# Patient Record
Sex: Female | Born: 1945 | Race: White | Hispanic: No | Marital: Married | Smoking: Never smoker
Health system: Southern US, Community
[De-identification: ages and names within clinical notes are randomized; demographics above are authoritative.]

## PROBLEM LIST (undated history)

## (undated) ENCOUNTER — Emergency Department (HOSPITAL_COMMUNITY): Admission: EM | Payer: Medicare Other | Source: Home / Self Care

## (undated) DIAGNOSIS — Z803 Family history of malignant neoplasm of breast: Secondary | ICD-10-CM

## (undated) DIAGNOSIS — K222 Esophageal obstruction: Secondary | ICD-10-CM

## (undated) DIAGNOSIS — Z86010 Personal history of colonic polyps: Secondary | ICD-10-CM

## (undated) DIAGNOSIS — D219 Benign neoplasm of connective and other soft tissue, unspecified: Secondary | ICD-10-CM

## (undated) DIAGNOSIS — Z8719 Personal history of other diseases of the digestive system: Secondary | ICD-10-CM

## (undated) DIAGNOSIS — Z8742 Personal history of other diseases of the female genital tract: Secondary | ICD-10-CM

## (undated) DIAGNOSIS — C786 Secondary malignant neoplasm of retroperitoneum and peritoneum: Secondary | ICD-10-CM

## (undated) DIAGNOSIS — C569 Malignant neoplasm of unspecified ovary: Secondary | ICD-10-CM

## (undated) DIAGNOSIS — E669 Obesity, unspecified: Secondary | ICD-10-CM

## (undated) DIAGNOSIS — I82409 Acute embolism and thrombosis of unspecified deep veins of unspecified lower extremity: Secondary | ICD-10-CM

## (undated) DIAGNOSIS — K5130 Ulcerative (chronic) rectosigmoiditis without complications: Secondary | ICD-10-CM

## (undated) DIAGNOSIS — H9201 Otalgia, right ear: Secondary | ICD-10-CM

## (undated) DIAGNOSIS — K5790 Diverticulosis of intestine, part unspecified, without perforation or abscess without bleeding: Secondary | ICD-10-CM

## (undated) DIAGNOSIS — I493 Ventricular premature depolarization: Secondary | ICD-10-CM

## (undated) DIAGNOSIS — Z800 Family history of malignant neoplasm of digestive organs: Secondary | ICD-10-CM

## (undated) DIAGNOSIS — Z8042 Family history of malignant neoplasm of prostate: Secondary | ICD-10-CM

## (undated) DIAGNOSIS — G709 Myoneural disorder, unspecified: Secondary | ICD-10-CM

## (undated) DIAGNOSIS — Z808 Family history of malignant neoplasm of other organs or systems: Secondary | ICD-10-CM

## (undated) DIAGNOSIS — I10 Essential (primary) hypertension: Secondary | ICD-10-CM

## (undated) DIAGNOSIS — C44621 Squamous cell carcinoma of skin of unspecified upper limb, including shoulder: Secondary | ICD-10-CM

## (undated) DIAGNOSIS — Z5189 Encounter for other specified aftercare: Secondary | ICD-10-CM

## (undated) DIAGNOSIS — E785 Hyperlipidemia, unspecified: Secondary | ICD-10-CM

## (undated) DIAGNOSIS — K219 Gastro-esophageal reflux disease without esophagitis: Secondary | ICD-10-CM

## (undated) DIAGNOSIS — G8929 Other chronic pain: Secondary | ICD-10-CM

## (undated) DIAGNOSIS — N3941 Urge incontinence: Secondary | ICD-10-CM

## (undated) HISTORY — DX: Secondary malignant neoplasm of retroperitoneum and peritoneum: C78.6

## (undated) HISTORY — DX: Acute embolism and thrombosis of unspecified deep veins of unspecified lower extremity: I82.409

## (undated) HISTORY — DX: Family history of malignant neoplasm of other organs or systems: Z80.8

## (undated) HISTORY — PX: COLONOSCOPY: SHX174

## (undated) HISTORY — PX: SKIN CANCER EXCISION: SHX779

## (undated) HISTORY — PX: BREAST LUMPECTOMY: SHX2

## (undated) HISTORY — PX: BREAST BIOPSY: SHX20

## (undated) HISTORY — DX: Hyperlipidemia, unspecified: E78.5

## (undated) HISTORY — DX: Encounter for other specified aftercare: Z51.89

## (undated) HISTORY — DX: Diverticulosis of intestine, part unspecified, without perforation or abscess without bleeding: K57.90

## (undated) HISTORY — DX: Esophageal obstruction: K22.2

## (undated) HISTORY — DX: Personal history of other diseases of the female genital tract: Z87.42

## (undated) HISTORY — DX: Obesity, unspecified: E66.9

## (undated) HISTORY — DX: Family history of malignant neoplasm of digestive organs: Z80.0

## (undated) HISTORY — DX: Other chronic pain: G89.29

## (undated) HISTORY — DX: Malignant neoplasm of unspecified ovary: C56.9

## (undated) HISTORY — DX: Ulcerative (chronic) rectosigmoiditis without complications: K51.30

## (undated) HISTORY — DX: Family history of malignant neoplasm of breast: Z80.3

## (undated) HISTORY — DX: Personal history of colonic polyps: Z86.010

## (undated) HISTORY — DX: Ventricular premature depolarization: I49.3

## (undated) HISTORY — PX: UPPER GASTROINTESTINAL ENDOSCOPY: SHX188

## (undated) HISTORY — DX: Otalgia, right ear: H92.01

## (undated) HISTORY — PX: PARTIAL HYSTERECTOMY: SHX80

## (undated) HISTORY — DX: Urge incontinence: N39.41

## (undated) HISTORY — DX: Family history of malignant neoplasm of prostate: Z80.42

## (undated) HISTORY — DX: Gastro-esophageal reflux disease without esophagitis: K21.9

## (undated) HISTORY — DX: Benign neoplasm of connective and other soft tissue, unspecified: D21.9

## (undated) HISTORY — DX: Squamous cell carcinoma of skin of unspecified upper limb, including shoulder: C44.621

---

## 1978-01-29 HISTORY — PX: ECTOPIC PREGNANCY SURGERY: SHX613

## 1996-01-30 DIAGNOSIS — D219 Benign neoplasm of connective and other soft tissue, unspecified: Secondary | ICD-10-CM

## 1996-01-30 HISTORY — PX: BLADDER NECK SUSPENSION: SHX1240

## 1996-01-30 HISTORY — DX: Benign neoplasm of connective and other soft tissue, unspecified: D21.9

## 2006-01-15 ENCOUNTER — Ambulatory Visit: Payer: Self-pay | Admitting: Family Medicine

## 2006-02-06 ENCOUNTER — Other Ambulatory Visit: Admission: RE | Admit: 2006-02-06 | Discharge: 2006-02-06 | Payer: Self-pay | Admitting: Family Medicine

## 2006-02-06 ENCOUNTER — Ambulatory Visit: Payer: Self-pay | Admitting: Family Medicine

## 2006-02-06 ENCOUNTER — Encounter: Admission: RE | Admit: 2006-02-06 | Discharge: 2006-02-06 | Payer: Self-pay | Admitting: Family Medicine

## 2006-02-27 ENCOUNTER — Ambulatory Visit: Payer: Self-pay | Admitting: Internal Medicine

## 2006-04-12 ENCOUNTER — Ambulatory Visit: Payer: Self-pay | Admitting: Internal Medicine

## 2006-11-13 ENCOUNTER — Telehealth: Payer: Self-pay | Admitting: Family Medicine

## 2006-11-13 DIAGNOSIS — R002 Palpitations: Secondary | ICD-10-CM | POA: Insufficient documentation

## 2007-06-03 ENCOUNTER — Encounter: Payer: Self-pay | Admitting: Family Medicine

## 2007-06-03 DIAGNOSIS — Z87898 Personal history of other specified conditions: Secondary | ICD-10-CM | POA: Insufficient documentation

## 2007-06-03 DIAGNOSIS — N3941 Urge incontinence: Secondary | ICD-10-CM

## 2007-06-03 DIAGNOSIS — E785 Hyperlipidemia, unspecified: Secondary | ICD-10-CM | POA: Insufficient documentation

## 2007-06-03 HISTORY — DX: Urge incontinence: N39.41

## 2007-06-04 ENCOUNTER — Ambulatory Visit: Payer: Self-pay | Admitting: Family Medicine

## 2007-06-04 LAB — CONVERTED CEMR LAB
Blood in Urine, dipstick: NEGATIVE
Mucus, UA: 0
Nitrite: NEGATIVE
Specific Gravity, Urine: 1.01
Urine crystals, microscopic: 0 /hpf
Yeast, UA: 0
pH: 5

## 2007-06-10 LAB — CONVERTED CEMR LAB
ALT: 17 units/L (ref 0–35)
AST: 17 units/L (ref 0–37)
Alkaline Phosphatase: 64 units/L (ref 39–117)
BUN: 12 mg/dL (ref 6–23)
Basophils Absolute: 0.1 10*3/uL (ref 0.0–0.1)
Basophils Relative: 0.9 % (ref 0.0–1.0)
Chloride: 109 meq/L (ref 96–112)
Cholesterol: 277 mg/dL (ref 0–200)
Direct LDL: 203.9 mg/dL
Eosinophils Absolute: 0.1 10*3/uL (ref 0.0–0.7)
Eosinophils Relative: 1.9 % (ref 0.0–5.0)
GFR calc Af Amer: 94 mL/min
HCT: 41.2 % (ref 36.0–46.0)
HDL: 53 mg/dL (ref >39.0)
Lymphocytes Relative: 28 % (ref 12.0–46.0)
Monocytes Relative: 8.2 % (ref 3.0–12.0)
Neutro Abs: 3.8 10*3/uL (ref 1.4–7.7)
Neutrophils Relative %: 61 % (ref 43.0–77.0)
Platelets: 235 10*3/uL (ref 150–400)
RBC: 4.56 M/uL (ref 3.87–5.11)
Sodium: 140 meq/L (ref 135–145)
TSH: 1.82 microintl units/mL (ref 0.35–5.50)
Total Bilirubin: 1.1 mg/dL (ref 0.3–1.2)
Total CHOL/HDL Ratio: 5.2
WBC: 6.2 10*3/uL (ref 4.5–10.5)

## 2008-04-16 ENCOUNTER — Encounter: Admission: RE | Admit: 2008-04-16 | Discharge: 2008-04-16 | Payer: Self-pay | Admitting: Family Medicine

## 2008-04-20 ENCOUNTER — Encounter (INDEPENDENT_AMBULATORY_CARE_PROVIDER_SITE_OTHER): Payer: Self-pay | Admitting: *Deleted

## 2008-06-04 ENCOUNTER — Ambulatory Visit: Payer: Self-pay | Admitting: Family Medicine

## 2008-06-07 ENCOUNTER — Encounter (INDEPENDENT_AMBULATORY_CARE_PROVIDER_SITE_OTHER): Payer: Self-pay | Admitting: *Deleted

## 2008-06-07 LAB — CONVERTED CEMR LAB
AST: 22 units/L (ref 0–37)
BUN: 10 mg/dL (ref 6–23)
Basophils Absolute: 0 10*3/uL (ref 0.0–0.1)
CO2: 29 meq/L (ref 19–32)
Eosinophils Absolute: 0.2 10*3/uL (ref 0.0–0.7)
Eosinophils Relative: 2.7 % (ref 0.0–5.0)
Hemoglobin: 14.5 g/dL (ref 12.0–15.0)
Lymphocytes Relative: 39.8 % (ref 12.0–46.0)
MCHC: 34.4 g/dL (ref 30.0–36.0)
MCV: 90.6 fL (ref 78.0–100.0)
Neutro Abs: 3 10*3/uL (ref 1.4–7.7)
Potassium: 3.9 meq/L (ref 3.5–5.1)
RBC: 4.66 M/uL (ref 3.87–5.11)
Sodium: 140 meq/L (ref 135–145)
TSH: 4.15 microintl units/mL (ref 0.35–5.50)

## 2008-06-08 LAB — CONVERTED CEMR LAB: Vit D, 25-Hydroxy: 33 ng/mL (ref 30–89)

## 2008-07-06 ENCOUNTER — Ambulatory Visit: Payer: Self-pay | Admitting: Cardiology

## 2008-07-16 ENCOUNTER — Encounter: Payer: Self-pay | Admitting: Cardiology

## 2008-07-16 ENCOUNTER — Ambulatory Visit: Payer: Self-pay

## 2008-09-30 ENCOUNTER — Ambulatory Visit: Payer: Self-pay | Admitting: Cardiology

## 2008-10-05 ENCOUNTER — Ambulatory Visit: Payer: Self-pay | Admitting: Cardiology

## 2008-10-05 LAB — CONVERTED CEMR LAB
AST: 27 units/L (ref 0–37)
Alkaline Phosphatase: 60 units/L (ref 39–117)
Bilirubin, Direct: 0.1 mg/dL (ref 0.0–0.3)
Cholesterol: 136 mg/dL (ref 0–200)
HDL: 57 mg/dL (ref >39.00)
LDL Cholesterol: 58 mg/dL (ref 0–99)
Total Bilirubin: 1 mg/dL (ref 0.3–1.2)
Total CHOL/HDL Ratio: 2
Total Protein: 6.9 g/dL (ref 6.0–8.3)
VLDL: 21 mg/dL (ref 0.0–40.0)

## 2008-11-29 ENCOUNTER — Telehealth (INDEPENDENT_AMBULATORY_CARE_PROVIDER_SITE_OTHER): Payer: Self-pay | Admitting: *Deleted

## 2008-12-29 ENCOUNTER — Ambulatory Visit: Payer: Self-pay | Admitting: Family Medicine

## 2009-05-11 ENCOUNTER — Encounter: Admission: RE | Admit: 2009-05-11 | Discharge: 2009-05-11 | Payer: Self-pay | Admitting: Family Medicine

## 2009-05-12 ENCOUNTER — Encounter: Payer: Self-pay | Admitting: Family Medicine

## 2009-06-06 ENCOUNTER — Ambulatory Visit: Payer: Self-pay | Admitting: Family Medicine

## 2009-06-06 DIAGNOSIS — M79609 Pain in unspecified limb: Secondary | ICD-10-CM | POA: Insufficient documentation

## 2009-06-06 DIAGNOSIS — M722 Plantar fascial fibromatosis: Secondary | ICD-10-CM

## 2009-06-07 LAB — CONVERTED CEMR LAB
Albumin: 4.4 g/dL (ref 3.5–5.2)
Alkaline Phosphatase: 61 units/L (ref 39–117)
Basophils Relative: 0.4 % (ref 0.0–3.0)
CO2: 26 meq/L (ref 19–32)
Eosinophils Relative: 2.4 % (ref 0.0–5.0)
GFR calc non Af Amer: 94.96 mL/min (ref >60)
HDL: 66.6 mg/dL (ref >39.00)
Hemoglobin: 14 g/dL (ref 12.0–15.0)
Lymphocytes Relative: 32.3 % (ref 12.0–46.0)
Lymphs Abs: 2.2 10*3/uL (ref 0.7–4.0)
MCHC: 34.7 g/dL (ref 30.0–36.0)
MCV: 89.4 fL (ref 78.0–100.0)
Neutro Abs: 3.9 10*3/uL (ref 1.4–7.7)
Platelets: 248 10*3/uL (ref 150.0–400.0)
Potassium: 4.6 meq/L (ref 3.5–5.1)
RBC: 4.5 M/uL (ref 3.87–5.11)
TSH: 2.49 microintl units/mL (ref 0.35–5.50)
Total Bilirubin: 0.6 mg/dL (ref 0.3–1.2)
Triglycerides: 111 mg/dL (ref 0.0–149.0)
VLDL: 22.2 mg/dL (ref 0.0–40.0)
WBC: 6.9 10*3/uL (ref 4.5–10.5)

## 2009-06-10 ENCOUNTER — Ambulatory Visit: Payer: Self-pay | Admitting: Family Medicine

## 2009-08-19 ENCOUNTER — Telehealth: Payer: Self-pay | Admitting: Family Medicine

## 2009-10-04 ENCOUNTER — Ambulatory Visit: Payer: Self-pay | Admitting: Cardiology

## 2009-10-07 ENCOUNTER — Ambulatory Visit: Payer: Self-pay | Admitting: Cardiology

## 2009-10-10 LAB — CONVERTED CEMR LAB
ALT: 26 units/L (ref 0–35)
AST: 23 units/L (ref 0–37)
Albumin: 4 g/dL (ref 3.5–5.2)
Alkaline Phosphatase: 46 units/L (ref 39–117)
LDL Cholesterol: 71 mg/dL (ref 0–99)
Total Bilirubin: 0.6 mg/dL (ref 0.3–1.2)
Total Protein: 6.3 g/dL (ref 6.0–8.3)

## 2009-11-22 ENCOUNTER — Ambulatory Visit: Payer: Self-pay | Admitting: Family Medicine

## 2010-02-28 NOTE — Letter (Signed)
Summary: Results Follow up Letter  [place] [facility]  [address] [place], [place] [address]   Phone: [phone]  Fax: [phone]    05/12/2009 MRN: [id number]    [facility] [place], [place]  [address]    Dear Ms. Viner,  The following are the results of your recent test(s):  Test         Result    Pap Smear:        Normal _____  Not Normal _____ Comments: ______________________________________________________ Cholesterol: LDL(Bad cholesterol):         Your goal is less than:         HDL (Good cholesterol):       Your goal is more than: Comments:  ______________________________________________________ Mammogram:        Normal __X___  Not Normal _____ Comments:Please repeat Mammogram in one year.  ___________________________________________________________________ Hemoccult:        Normal _____  Not normal _______ Comments:    _____________________________________________________________________ Other Tests:    We routinely do not discuss normal results over the telephone.  If you desire a copy of the results, or you have any questions about this information we can discuss them at your next office visit.   Sincerely,    Roque Lias Tower,MD  MT/ri

## 2010-02-28 NOTE — Consult Note (Signed)
Summary: [facility] By: Edmonia James 06/21/2009 10:49:08  _____________________________________________________________________  External Attachment:    Type:   Image     Comment:   External Document

## 2010-02-28 NOTE — Assessment & Plan Note (Signed)
Summary: [place]  Nurse Visit   Allergies: No Known Drug Allergies  Immunizations Administered:  Zostavax # 1:    Vaccine Type: Zostavax    Site: left deltoid    Mfr: Merck    Dose: 0.5 ml    Route: [place]    Given by: Ozzie Hoyle LPN    Exp. Date: 07/08/2010    Lot #: [id number]    VIS given: 11/10/04 given Jun 10, 2009.  Waiver of liability form signed and to be scanned into system. Pt had contacted her insurance co and pt is not around any person on chemotherapy or pregnant.Ozzie Hoyle LPN  Jun 11, 4095 35:32 PM  Orders Added: 1)  Zoster (Shingles) Vaccine Live [99242] 2)  Admin 1st Vaccine [id number]

## 2010-02-28 NOTE — Assessment & Plan Note (Signed)
Summary: [facility]   Primary Provider:  Allena Earing MD  CC:  follow up 1 year.  Pt notes no new cardiac symptoms.  She reports she still has the occasional irregular heartbeat but nothing out of the ordinary.  Pt would like copy of recent labs and echo from last year.  History of Present Illness: 65 yo with hyperlipidemia and family history of sudden cardiac death and CAD returns for followup.  Since last appointment, she has been doing well.  She continues to look after her husband who has PLS. No chest pain or exertional dyspnea.  She does have occasional palpitations.  Main complaint is a dry cough.  This often will occur while she is eating.  She says she will cough as soon as she swallows food (never with drinking). She coughs at night when lying in bed.   Labs (9/10):  LDL 58, HDL 57, AST 27, ALT 36 Labs (9/11): LDL 71, HDL 54  ECG: NSR, normal  Current Medications (verified): 1)  Adult Aspirin Low Strength 81 Mg  Tbdp (Aspirin) .... Take One By Mouth Daily 2)  Cranberry 300 Mg Tabs (Cranberry) .... Take 1 Tablet By Mouth Once A Day 3)  Co Q-10 150 Mg Caps (Coenzyme Q10) .... Take 1 Tablet By Mouth Once A Day 4)  Crestor 20 Mg Tabs (Rosuvastatin Calcium) .Marland Kitchen.. 1 By Mouth Once Daily 5)  Vitamin D3 5,000 Iu .... Take One By Mouth Every Other Day 6)  Fish Oil 1200 Mg Caps (Omega-3 Fatty Acids) .... Take 1 Capsule By Mouth Twice A Day 7)  Grape Seed Extract 100 Mg Caps (Grape Seed) .... Two Times A Day Cap 8)  Calcium Citrate-Vitamin D 315-200 Mg-Unit  Tabs (Calcium Citrate-Vitamin D) .... Take One By Mouth Twice A Day 9)  B Complex  Tabs (B Complex Vitamins) .... Take One Tablet Once Daily  Allergies (verified): No Known Drug Allergies  Past History:  Past Medical History: 1. Hyperlipidemia 2. 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  4. Echo (6/10): EF 15-40%, normal diastolic function, normal RV size and function, mild MR, PASP 33 mmHg.  5. GERD  Family  History: Reviewed history from 06/06/2009 and no changes required. Father: sudden death age 51- "coronary arteriosclerosis" on death certificate as cause of death.  Mother: CAD, MI in 56's, TIA's before that, sudden death- ? cardiac , DM 2  Siblings: Brother with arrythmia, colon cancer.  Sister has breast cancer.  Son pituitary tumor 2/10   Social History: Reviewed history from 06/04/2008 and no changes required. 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   Vital Signs:  Patient profile:   65 year old female Height:      62.5 inches Weight:      192 pounds BMI:     34.68 Pulse rate:   66 / minute Pulse rhythm:   regular BP sitting:   116 / 74  (left arm) Cuff size:   regular  Vitals Entered By: Doug Sou CMA (October 07, 2009 8:57 AM)  Physical Exam  General:  overweight but generally well appearing  Neck:  Neck supple, no JVD. No masses, thyromegaly or abnormal cervical nodes. Lungs:  Clear bilaterally to auscultation and percussion. Heart:  Non-displaced PMI, chest non-tender; regular rate and  rhythm, S1, S2 without murmurs, rubs or gallops. Carotid upstroke normal, no bruit.  Pedals normal pulses. No edema, no varicosities. Abdomen:  Bowel sounds positive; abdomen soft and non-tender without masses, organomegaly, or hernias noted. No hepatosplenomegaly. Extremities:  No clubbing or cyanosis. Neurologic:  Alert and oriented x 3. Psych:  Normal affect.   Impression & Recommendations:  Problem # 1:  CORONARY ARTERY DISEASE, FAMILY HX (ICD-V17.3) No chest pain or exertional dyspnea.  Lipids are excellent on Crestor.  Would continue Crestor and low-dose ASA.   Problem # 2:  COUGH Patient reports a cough when she swallows and also at night when lying down.  This may be due to GERD, so  will start her on omeprazole  40 mg daily.  If this does not help, she may need a barium swallow to assess for aspiration (coughs right when she swallows some foods).    Patient Instructions: 1)  Your physician wants you to follow-up in:1 year with Dr Aundra Dubin.  You will receive a reminder letter in the mail two months in advance. If you don't receive a letter, please call our office to schedule the follow-up appointment. Prescriptions: OMEPRAZOLE 40 MG CPDR (OMEPRAZOLE) one daily  #30 x 11   Entered by:   Desiree Lucy, RN, BSN   Authorized by:   [name], MD   Signed by:   Desiree Lucy, RN, BSN on 10/07/2009   Method used:   Print then Give to Patient   RxID:   [id number] CRESTOR 20 MG TABS (ROSUVASTATIN CALCIUM) 1 by mouth once daily  #30 x 12   Entered by:   Desiree Lucy, RN, BSN   Authorized by:   [name], MD   Signed by:   Desiree Lucy, RN, BSN on 10/07/2009   Method used:   Print then Give to Patient   RxID:   [id number]

## 2010-02-28 NOTE — Miscellaneous (Signed)
Summary: mammogram screening  Clinical Lists Changes  Observations: Added new observation of MAMMO DUE: 04/2010 (05/12/2009 11:56) Added new observation of MAMMOGRAM: normal (05/11/2009 11:57)      Preventive Care Screening  Mammogram:    Date:  05/11/2009    Next Due:  04/2010    Results:  normal

## 2010-02-28 NOTE — Assessment & Plan Note (Signed)
Summary: [facility]  Nurse Visit   Allergies: No Known Drug Allergies  Orders Added: 1)  Admin 1st Vaccine [90471] 2)  Flu Vaccine 4yr + [[79980]    Flu Vaccine Consent Questions     Do you have a history of severe allergic reactions to this vaccine? no    Any prior history of allergic reactions to egg and/or gelatin? no    Do you have a sensitivity to the preservative Thimersol? no    Do you have a past history of Guillan-Barre Syndrome? no    Do you currently have an acute febrile illness? no    Have you ever had a severe reaction to latex? no    Vaccine information given and explained to patient? yes    Are you currently pregnant? no    Lot Number:[id number]   Exp Date:07/29/2010   Site Given  Left Deltoid IM

## 2010-02-28 NOTE — Assessment & Plan Note (Signed)
Summary: CPX / LFW   Vital Signs:  Patient profile:   65 year old female Height:      62.5 inches Weight:      198.75 pounds BMI:     35.90 Temp:     98.1 degrees F oral Pulse rate:   64 / minute Pulse rhythm:   regular BP sitting:   122 / 76  (left arm) Cuff size:   large  Vitals Entered By: Ozzie Hoyle LPN (Jun 07, 3662 4:03 AM) CC: CPX LMP part Hyst 1998   History of Present Illness: here for health mt and to rev chronic med problems  feeling ok overall   not much new   her feet kill her -- mainly heels -- worse when she takes shoes off and when she first gets up  her sister had plantar fasciitis  helps to stretch feet too   husband retired - Production assistant, radio  is not working- looking for a job in medical  husband is has PLS -- is getting weaker and falls a lot   wt is up 7 lb - with bmi 35  lipids due to check on statin diet- has not been the best / she likes better food , but has to cook for her husband -- so eating worse food and more than she should  will check labs today  has exercised- walks 3/4 mi per day   bp good 122/76  colonosc 08 ok with tics-- re check 5y planned  hyst in past for fibroids/partial nl pap 08  mam 4/11 normal  self exam -- no lumps or changes   ca and D-- is taking that -- dropped to every other day on vit d -- because her ca has D in it    TD 07 wantszostavax had pneumonia vaccine in fall  Allergies (verified): No Known Drug Allergies  Past History:  Past Medical History: Last updated: 10/05/2008 1. Hyperlipidemia 2. 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  4. Echo (6/10): EF 47-42%, normal diastolic function, normal RV size and function, mild MR, PASP 33 mmHg.   Past Surgical History: Last updated: 06/04/2007 Tubal pregnancy (1980) Blood transfusion Hysterectomy- partial, fibroids and prolapse (1998)- bladder tack Work up for chronic right ear pain- normal MRI Colonoscopy- tics  (03/2006)  Family History: Last updated: 2009-06-08 Father: sudden death age 70- "coronary arteriosclerosis" on death certificate as cause of death.  Mother: CAD, MI in 79's, TIA's before that, sudden death- ? cardiac , DM 2  Siblings: Brother with arrythmia, colon cancer.  Sister has breast cancer.  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  Mother: CAD, MI in 61's, TIA's before that, sudden death- ? cardiac , DM 2  Siblings: Brother with arrythmia, colon cancer.  Sister has breast cancer.  Son pituitary tumor 2/10   Review of Systems General:  Denies fatigue, loss of appetite, and malaise. Eyes:  Denies blurring and eye irritation. CV:  Denies chest pain or discomfort, lightheadness, and palpitations. Resp:  Denies cough, shortness of breath, sputum productive, and wheezing. GI:  Denies abdominal pain, bloody stools, change in bowel habits, and indigestion. GU:  Denies abnormal vaginal bleeding, discharge,  dysuria, nocturia, and urinary frequency. MS:  Denies joint redness, joint swelling, muscle aches, and cramps. Derm:  Denies itching, lesion(s), poor wound healing, and rash. Neuro:  Denies numbness and tingling. Psych:  mood is fair . Endo:  Denies cold intolerance, excessive thirst, excessive urination, and heat intolerance. Heme:  Denies abnormal bruising and bleeding.  Physical Exam  General:  overweight but generally well appearing  Head:  normocephalic, atraumatic, and no abnormalities observed.   Eyes:  vision grossly intact, pupils equal, pupils round, and pupils reactive to light.   Ears:  R ear normal and L ear normal.   Nose:  no  nasal discharge.   Mouth:  pharynx pink and moist.   Neck:  supple with full rom and no masses or thyromegally, no JVD or carotid bruit  Chest Wall:  No deformities, masses, or tenderness noted. Breasts:  No mass, nodules, thickening, tenderness, bulging, retraction, inflamation, nipple discharge or skin changes noted.   Lungs:  Normal respiratory effort, chest expands symmetrically. Lungs are clear to auscultation, no crackles or wheezes. (bs are harsh at bases with dry cough) Heart:  Normal rate and regular rhythm. S1 and S2 normal without gallop, murmur, click, rub or other extra sounds. Abdomen:  Bowel sounds positive,abdomen soft and non-tender without masses, organomegaly or hernias noted. no renal bruits  Msk:  R thumb- lump consistent with herberden's node on distal joint  nl rom hand   no heel or foot tenderness while sitting pt c/o heel pain to get up and walk Pulses:  R and L carotid,radial,femoral,dorsalis pedis and posterior tibial pulses are full and equal bilaterally Extremities:  No clubbing, cyanosis, edema, or deformity noted with normal full range of motion of all joints.   Neurologic:  sensation intact to light touch, gait normal, and DTRs symmetrical and normal.   Skin:  Intact without suspicious lesions or rashes lentigos diffusely  Cervical Nodes:  No lymphadenopathy noted Axillary Nodes:  No palpable lymphadenopathy Inguinal Nodes:  No significant adenopathy Psych:  nl affect    Impression & Recommendations:  Problem # 1:  HEALTH MAINTENANCE EXAM (ICD-V70.0) Assessment Comment Only  reviewed health habits including diet, exercise and skin cancer prevention reviewed health maintenance list and family history disc need for wt loss and better habits  lab today set up zostavax   Orders: Venipuncture (24462) TLB-Lipid Panel (80061-LIPID) TLB-BMP (Basic Metabolic Panel-BMET) (86381-RRNHAFB) TLB-CBC Platelet - w/Differential (85025-CBCD) TLB-Hepatic/Liver  Function Pnl (80076-HEPATIC) TLB-TSH (Thyroid Stimulating Hormone) (84443-TSH)  Problem # 2:  HYPERLIPIDEMIA (ICD-272.4) Assessment: Unchanged  check labs on crestor and diet  disc low sat fat diet  pt expects this to be up Her updated medication list for this problem includes:    Crestor 20 Mg Tabs (Rosuvastatin calcium) .Marland Kitchen... 1 by mouth once daily  Labs Reviewed: SGOT: 27 (09/30/2008)   SGPT: 36 (09/30/2008)   HDL:57.00 (09/30/2008), 55.10 (06/04/2008)  LDL:58 (09/30/2008), DEL (06/04/2007)  Chol:136 (09/30/2008), 339 (06/04/2008)  Trig:105.0 (09/30/2008), 207.0 (06/04/2008)  Orders: Venipuncture (90383) TLB-Lipid Panel (80061-LIPID) TLB-BMP (Basic Metabolic Panel-BMET) (33832-NVBTYOM) TLB-CBC Platelet - w/Differential (85025-CBCD) TLB-Hepatic/Liver Function Pnl (80076-HEPATIC) TLB-TSH (Thyroid Stimulating Hormone) (84443-TSH)  Problem # 3:  FASCIITIS, PLANTAR (ICD-728.71) Assessment: New with heel pain if barefoot handout given from [facility]- recommend always wearing shoes/ stretches and ice  if not imp will f/u DR Copland  Problem # 4:  THUMB PAIN (ICD-729.5) Assessment: 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 Take one by mouth daily 2)  Cranberry 300 Mg Tabs (Cranberry) .... Take 1 tablet by mouth once a day 3)  Co Q-10 150 Mg Caps (Coenzyme q10) .... Take 1 tablet by mouth once a day 4)  Crestor 20 Mg Tabs (Rosuvastatin calcium) .Marland Kitchen.. 1 by mouth once daily 5)  Vitamin D3 5,000 Iu  .... Take one by mouth every other day 6)  Fish Oil 1200 Mg Caps (Omega-3 fatty acids) .... Take 1 capsule by mouth twice a day 7)  Grape Seed Extract 100 Mg Caps (Grape seed) .... Two times a day cap 8)  Calcium Citrate-vitamin D 315-200 Mg-unit Tabs (Calcium  citrate-vitamin d) .... Take one by mouth twice a day  Patient Instructions: 1)  if plantar fasciitis worsens - call for appt with Dr Lorelei Pont- our sports med doctor 2)  I will ref you to a hand specialist at check out  3)  keep working on healthy diet and exercise 4)  labs today  5)  no change in medicine  Prescriptions: CRESTOR 20 MG TABS (ROSUVASTATIN CALCIUM) 1 by mouth once daily  #30 x 11   Entered and Authorized by:   Allena Earing MD   Signed by:   Allena Earing MD on 06/06/2009   Method used:   Print then Give to Patient   RxID:   [id number] CRESTOR 20 MG TABS (ROSUVASTATIN CALCIUM) 1 by mouth once daily  #90 x 3   Entered and Authorized by:   Allena Earing MD   Signed by:   Allena Earing MD on 06/06/2009   Method used:   Print then Give to Patient   RxID:   [id number]   Current Allergies (reviewed today): No known allergies

## 2010-02-28 NOTE — Progress Notes (Signed)
Summary: ? about blood work   Phone Note Call from Patient Call back at [phone]   Caller: Patient Call For: Rachel Earing MD Summary of Call: Pt went to see hand specialist your recommended. Pt is to have blood work to be done for autoimmune arthritis. Pt wants to know if taking ASA 67m daily will effect the blood test. Pt asked at the lab and they told her to ask hand specialist. Hand specialist did not know and pt was told to contact primary care. Please advise.  Initial call taken by: ROzzie HoyleLPN,  July 22, [id number]  Follow-up for Phone Call        I do not think it will affect the labs Follow-up by: MAllena EaringMD,  August 19, 2009 8:24 AM  Additional Follow-up for Phone Call Additional follow up Details #1::        Patient notified as instructed by telephone. ROzzie HoyleLPN  July 22, [id number]

## 2010-04-14 ENCOUNTER — Encounter: Payer: Self-pay | Admitting: Family Medicine

## 2010-04-28 ENCOUNTER — Telehealth: Payer: Self-pay | Admitting: *Deleted

## 2010-04-28 ENCOUNTER — Other Ambulatory Visit: Payer: Self-pay | Admitting: Family Medicine

## 2010-04-28 DIAGNOSIS — Z780 Asymptomatic menopausal state: Secondary | ICD-10-CM

## 2010-04-28 DIAGNOSIS — Z1231 Encounter for screening mammogram for malignant neoplasm of breast: Secondary | ICD-10-CM

## 2010-04-28 NOTE — Telephone Encounter (Signed)
Pt is asking if you think she should have a bone density, now that she is 64.

## 2010-04-30 NOTE — Telephone Encounter (Signed)
If she has family hx of OP or she has been on long term steroids or is on medicine to lower stomach acid or is hypothyroid/ hyperthyroid   or has broken bones in later life-- yes OR -- if she checks with her ins and will cover-- yes  Otherwise will wait until she is 22  Keep taking ca and vit D-- very important

## 2010-05-01 NOTE — Telephone Encounter (Signed)
Patient notified as instructed by telephone. Pt said she does not have any of problems listed and will wait until age 65 unless condition changes.

## 2010-05-01 NOTE — Telephone Encounter (Signed)
Left message for pt to call back  °

## 2010-05-15 ENCOUNTER — Other Ambulatory Visit: Payer: Self-pay

## 2010-05-15 ENCOUNTER — Ambulatory Visit
Admission: RE | Admit: 2010-05-15 | Discharge: 2010-05-15 | Disposition: A | Discharge status: Home / Self Care | Payer: BLUE CROSS/BLUE SHIELD | Source: Ambulatory Visit | Attending: Family Medicine | Admitting: Family Medicine

## 2010-05-15 DIAGNOSIS — Z1231 Encounter for screening mammogram for malignant neoplasm of breast: Secondary | ICD-10-CM

## 2010-05-15 LAB — HM MAMMOGRAPHY: HM Mammogram: NORMAL

## 2010-05-27 ENCOUNTER — Encounter: Payer: Self-pay | Admitting: Family Medicine

## 2010-06-01 ENCOUNTER — Telehealth: Payer: Self-pay | Admitting: Family Medicine

## 2010-06-01 DIAGNOSIS — E785 Hyperlipidemia, unspecified: Secondary | ICD-10-CM

## 2010-06-01 DIAGNOSIS — Z0000 Encounter for general adult medical examination without abnormal findings: Secondary | ICD-10-CM | POA: Insufficient documentation

## 2010-06-01 NOTE — Telephone Encounter (Signed)
Message copied by Loura Pardon on Thu Jun 01, 2010  8:21 AM ------      Message from: Daralene Milch      Created: Tue May 30, 2010  1:42 PM      Regarding: Cpx labs fri       Please order  future cpx labs for pt's upcomming lab appt.      Thanks      Aniceto Boss

## 2010-06-02 ENCOUNTER — Other Ambulatory Visit (INDEPENDENT_AMBULATORY_CARE_PROVIDER_SITE_OTHER): Payer: BC Managed Care – PPO | Admitting: Family Medicine

## 2010-06-02 DIAGNOSIS — Z0000 Encounter for general adult medical examination without abnormal findings: Secondary | ICD-10-CM

## 2010-06-02 DIAGNOSIS — E785 Hyperlipidemia, unspecified: Secondary | ICD-10-CM

## 2010-06-02 LAB — COMPREHENSIVE METABOLIC PANEL
AST: 23 U/L (ref 0–37)
Albumin: 4 g/dL (ref 3.5–5.2)
Alkaline Phosphatase: 47 U/L (ref 39–117)
Calcium: 9.4 mg/dL (ref 8.4–10.5)
Chloride: 106 mEq/L (ref 96–112)
GFR: 93.05 mL/min (ref >60.00)
Glucose, Bld: 104 mg/dL — ABNORMAL HIGH (ref 70–99)
Total Protein: 6.8 g/dL (ref 6.0–8.3)

## 2010-06-02 LAB — CBC WITH DIFFERENTIAL/PLATELET
Basophils Absolute: 0 10*3/uL (ref 0.0–0.1)
HCT: 39.3 % (ref 36.0–46.0)
MCHC: 34.7 g/dL (ref 30.0–36.0)
Neutro Abs: 3.5 10*3/uL (ref 1.4–7.7)
Neutrophils Relative %: 56.8 % (ref 43.0–77.0)
Platelets: 218 10*3/uL (ref 150.0–400.0)
RBC: 4.36 Mil/uL (ref 3.87–5.11)
RDW: 13.2 % (ref 11.5–14.6)
WBC: 6.2 10*3/uL (ref 4.5–10.5)

## 2010-06-02 LAB — LIPID PANEL: Cholesterol: 176 mg/dL (ref 0–200)

## 2010-06-08 ENCOUNTER — Encounter: Payer: Self-pay | Admitting: Family Medicine

## 2010-06-09 ENCOUNTER — Ambulatory Visit (INDEPENDENT_AMBULATORY_CARE_PROVIDER_SITE_OTHER): Payer: BC Managed Care – PPO | Admitting: Family Medicine

## 2010-06-09 ENCOUNTER — Encounter: Payer: Self-pay | Admitting: Family Medicine

## 2010-06-09 DIAGNOSIS — M722 Plantar fascial fibromatosis: Secondary | ICD-10-CM

## 2010-06-09 DIAGNOSIS — L989 Disorder of the skin and subcutaneous tissue, unspecified: Secondary | ICD-10-CM

## 2010-06-09 DIAGNOSIS — Z0000 Encounter for general adult medical examination without abnormal findings: Secondary | ICD-10-CM

## 2010-06-09 DIAGNOSIS — E785 Hyperlipidemia, unspecified: Secondary | ICD-10-CM

## 2010-06-09 NOTE — Progress Notes (Signed)
Subjective:    Patient ID: Rachel Carlson, female    DOB: 01-16-1946, 65 y.o.   MRN: [id number]  HPI Here for annual health mt exam and to rev chronic med problems   Feet are still killing her - plantar fasciitis  L used to hurt more -- then in feb the R foot got much worse Did some stretching  Saw Dr Cay Schillings  Started prednisone within last few days  Given px for mobic as well  Not icing  Wears new balance shoes Wants to avoid injection if possible  Sugar 104   Gets up to urinate at night - normal for her   Wt is up 4 lb - with bmi of 35  Mam 4/12 Self exam - no lumps or problems   Had hyst and bladder tack- did not work for long  Had prolapse  No cervix No problems   Diet is ok - does watch out for sugar some but really likes chocolate  Has to be careful with that  Sticks with one serving    td07 zostavax 2011  3/08 colonosc-brother had colon cancer- will be due 3/13 No bowel changes    Lipids are well controlled on crestor and diet with LDL 88- stable Lab Results  Component Value Date   CHOL 176 06/02/2010   CHOL 142 10/04/2009   CHOL 167 06/06/2009   Lab Results  Component Value Date   HDL 56.40 06/02/2010   HDL 54.20 10/04/2009   HDL 66.60 06/06/2009   Lab Results  Component Value Date   LDLCALC 88 06/02/2010   LDLCALC 71 10/04/2009   LDLCALC 78 06/06/2009   Lab Results  Component Value Date   TRIG 158.0* 06/02/2010   TRIG 84.0 10/04/2009   TRIG 111.0 06/06/2009   Lab Results  Component Value Date   CHOLHDL 3 06/02/2010   CHOLHDL 3 10/04/2009   CHOLHDL 3 06/06/2009   Lab Results  Component Value Date   LDLDIRECT 257.3 06/04/2008   LDLDIRECT 203.9 06/04/2007    Has spot to check on chest Was tag lesion - now is more scaly and irritated - ? Keratotic   Past Medical History  Diagnosis Date  . Hyperlipidemia   . Obesity   . GERD (gastroesophageal reflux disease)   . PVC (premature ventricular contraction)     first dx by holter in 1980's, echo (6/10) EF  40-98%, normal diastolic fxn, normal size RV and fxn, mild MR, PASP 10mHg  . Chronic right ear pain     normal MRI  . Tubal pregnancy   . Blood transfusion, without reported diagnosis     History   Social History  . Marital Status: Married    Spouse Name: N/A    Number of Children: 3  . Years of Education: N/A   Occupational History  . Veterinarin         Social History Main Topics  . Smoking status: Never Smoker   . Smokeless tobacco: Not on file  . Alcohol Use: Not on file  . Drug Use: Not on file  . Sexually Active: Not on file   Other Topics Concern  . Not on file   Social History Narrative   Works long hours, Husband has PLS (less common type of ALS)    Past Surgical History  Procedure Date  . Ectopic pregnancy surgery 1980  . Abdominal hysterectomy     partial, fibroids, and prolapse (1998) bladder tack    Family History  Problem  Relation Age of Onset  . Sudden death Father 73    "coronary arteriosclerosis" on death certificate  . Coronary artery disease Mother   . Heart attack Mother 53  . Transient ischemic attack Mother   . Diabetes type II Mother   . Sudden death Mother     ?cardiac  . Diabetes Mother   . Heart disease Mother     CAD MI in 70's//TIA's before that   . Arrhythmia Brother   . Colon cancer Brother   . Breast cancer Sister   . Cancer Sister     Breast Cancer  . Other Son     pituitary tumor    No Known Allergies   Review of Systems Review of Systems  Constitutional: Negative for fever, appetite change, fatigue and unexpected weight change.  Eyes: Negative for pain and visual disturbance.  Respiratory: Negative for cough and shortness of breath.   Cardiovascular: Negative for cp or sob or edema .   Gastrointestinal: Negative for nausea, diarrhea and constipation.  Genitourinary: Negative for urgency and pos for frequency at night  Skin: Negative for pallor.  MSK pos for foot pain and some joint aches and pains, neg for joint  swelling  Neurological: Negative for weakness, light-headedness, numbness and headaches.  Hematological: Negative for adenopathy. Does not bruise/bleed easily.  Psychiatric/Behavioral: Negative for dysphoric mood. The patient is not nervous/anxious.          Objective:   Physical Exam  Constitutional: She appears well-developed and well-nourished.       overwt and well appearing   HENT:  Head: Normocephalic and atraumatic.  Right Ear: External ear normal.  Left Ear: External ear normal.  Nose: Nose normal.  Mouth/Throat: Oropharynx is clear and moist.  Eyes: Conjunctivae and EOM are normal. Pupils are equal, round, and reactive to light.  Neck: Neck supple. No JVD present. Carotid bruit is not present. No thyromegaly present.  Cardiovascular: Normal rate, regular rhythm and normal heart sounds.   Pulmonary/Chest: Effort normal and breath sounds normal. No respiratory distress. She has no wheezes. She exhibits no tenderness.  Abdominal: Soft. Bowel sounds are normal. She exhibits no distension, no abdominal bruit and no mass. There is no tenderness.  Genitourinary: No breast swelling, tenderness, discharge or bleeding.  Musculoskeletal: Normal range of motion. She exhibits tenderness. She exhibits no edema.       Heels and arches of feet are tender Hurts to get up after sitting   Lymphadenopathy:    She has no cervical adenopathy.  Neurological: She is alert. She has normal reflexes. Coordination normal.  Skin: Skin is warm and dry. No rash noted. No erythema. No pallor.       Skin tag with keratotic tip on mid chest  Some telengectasias on face   Psychiatric: She has a normal mood and affect.          Assessment & Plan:

## 2010-06-09 NOTE — Assessment & Plan Note (Signed)
Reviewed health habits including diet and exercise and skin cancer prevention Also reviewed health mt list, fam hx and immunizations  Breast exam done S/p hyst utd on imms

## 2010-06-09 NOTE — Patient Instructions (Signed)
Try to watch diet carefully for fats and sugar When feet improve consider exercise program- ? Perhaps water exercise Labs ok - but we need to watch the sugar  Follow up with Dr Allyson Sabal for lesion on your chest

## 2010-06-09 NOTE — Assessment & Plan Note (Signed)
Continue ortho f/u for this  May end up needing injections or orthotics

## 2010-06-11 DIAGNOSIS — L989 Disorder of the skin and subcutaneous tissue, unspecified: Secondary | ICD-10-CM | POA: Insufficient documentation

## 2010-06-11 NOTE — Assessment & Plan Note (Signed)
Good control with crestor and diet Rev labs with pt  Rev low sat fat diet with pt in detail No change in med

## 2010-06-11 NOTE — Assessment & Plan Note (Signed)
Keratotic tag like lesion on mid chest - irritated Cannot rule out a skin cancer/ squamous cell lesion  Will return here or see her derm for removal

## 2010-07-13 ENCOUNTER — Telehealth: Payer: Self-pay | Admitting: Cardiology

## 2010-07-13 NOTE — Telephone Encounter (Signed)
Patient had her labs fasting Lipid and liver drawn in 05/2010 with Dr Glori Bickers  Told patient she did not need to have these repeated for her appointment with Dr Aundra Dubin in 09/2010

## 2010-07-13 NOTE — Telephone Encounter (Signed)
Pt called to set up recall appt, does she need blood work?

## 2010-10-09 ENCOUNTER — Ambulatory Visit: Payer: BC Managed Care – PPO | Admitting: Cardiology

## 2010-10-12 ENCOUNTER — Ambulatory Visit: Payer: BC Managed Care – PPO | Admitting: Cardiology

## 2010-10-13 ENCOUNTER — Ambulatory Visit (INDEPENDENT_AMBULATORY_CARE_PROVIDER_SITE_OTHER): Payer: BC Managed Care – PPO | Admitting: Cardiology

## 2010-10-13 ENCOUNTER — Encounter: Payer: Self-pay | Admitting: Cardiology

## 2010-10-13 VITALS — BP 132/92 | HR 69 | Ht 62.5 in | Wt 188.0 lb

## 2010-10-13 DIAGNOSIS — Z8249 Family history of ischemic heart disease and other diseases of the circulatory system: Secondary | ICD-10-CM

## 2010-10-13 DIAGNOSIS — E785 Hyperlipidemia, unspecified: Secondary | ICD-10-CM

## 2010-10-13 DIAGNOSIS — R030 Elevated blood-pressure reading, without diagnosis of hypertension: Secondary | ICD-10-CM

## 2010-10-13 MED ORDER — ROSUVASTATIN CALCIUM 20 MG PO TABS: 20.0000 mg | ORAL_TABLET | Freq: Every day | ORAL | Status: DC

## 2010-10-13 NOTE — Progress Notes (Signed)
PCP: Dr. Glori Bickers  65 yo with hyperlipidemia and family history of sudden cardiac death and CAD returns for followup.  Since last appointment, Rachel Carlson has been doing well.  Rachel Carlson continues to look after her husband who has PLS. No chest pain or exertional dyspnea.  Rachel Carlson does have occasional palpitations.  No lightheadedness or syncope.    Labs (9/10):  LDL 58, HDL 57, AST 27, ALT 36 Labs (9/11): LDL 71, HDL 54 Labs (5/12): K 4.4, creatinine 0.7, TSH normal, LDL 88, HDL 56  ECG: NSR, normal  Allergies (verified):  No Known Drug Allergies  Past Medical History: 1. Hyperlipidemia 2. 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  4. Echo (6/10): EF 96-22%, normal diastolic function, normal RV size and function, mild MR, PASP 33 mmHg.  5. GERD 6. Plantar fasciitis  Family History: Father: sudden death age 66- "coronary arteriosclerosis" on death certificate as cause of death.  Mother: CAD, MI in 61's, TIA's before that, sudden death- ? cardiac , DM 2  Siblings: Brother with arrythmia, colon cancer.  Sister has breast cancer.  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 aspirin 81 MG tablet Take 81 mg by mouth daily.        . calcium citrate-vitamin D (CITRACAL+D) 315-200 MG-UNIT per tablet Take 1 tablet by mouth 2 (two) times daily.        . Cholecalciferol (VITAMIN D-3) 5000 UNITS TABS 1 tablet by mouth on Monday,Wednesday and Friday      . Coenzyme Q10 (CO Q 10 PO) Take 400 Units by mouth daily.        . Cranberry 300 MG tablet Take 300 mg by mouth daily.        . Cranberry-Vitamin C ([facility]) 140-100 MG CAPS Take 2 capsules by mouth daily.        . Grape Seed Extract 100 MG CAPS Take 1 capsule by mouth 2 (two) times daily.        . meloxicam (MOBIC)  15 MG tablet as needed.      . Misc Natural Products (GLUCOSAMINE CHONDROITIN ADV PO) Take 1 tablet by mouth 2 (two) times daily.        . Omega-3 Fatty Acids (FISH OIL TRIPLE STRENGTH) 1400 MG CAPS Take by mouth daily.        Marland Kitchen omeprazole (PRILOSEC OTC) 20 MG tablet Take 20 mg by mouth daily. For 14 days as needed.       . rosuvastatin (CRESTOR) 20 MG tablet Take 20 mg by mouth daily.        . rosuvastatin (CRESTOR) 20 MG tablet Take 1 tablet (20 mg total) by mouth at bedtime.  30 tablet  11    BP 132/92  Pulse 69  Ht 5' 2.5" (1.588 m)  Wt 188 lb (85.276 kg)  BMI 33.84 kg/m2  LMP 05/29/1996 General: NAD, overweight Neck: No JVD, no thyromegaly or thyroid nodule.  Lungs: Clear to auscultation bilaterally with normal respiratory effort. CV: Nondisplaced PMI.  Heart regular S1/S2, no S3/S4, no murmur.  No peripheral edema.  No carotid bruit.  Normal pedal pulses.  Abdomen: Soft, nontender, no hepatosplenomegaly, no distention.  Neurologic: Alert and oriented x 3.  Psych: Normal affect. Extremities: No clubbing or cyanosis.

## 2010-10-13 NOTE — Patient Instructions (Addendum)
Your physician wants you to follow-up in: 1 year with Dr Aundra Dubin.Thane Edu 2013).You will receive a reminder letter in the mail two months in advance. If you don't receive a letter, please call our office to schedule the follow-up appointment.   Take and record your blood pressure. I will call you in 2 weeks to get the readings. Eliot Ford [phone]

## 2010-10-15 DIAGNOSIS — Z8249 Family history of ischemic heart disease and other diseases of the circulatory system: Secondary | ICD-10-CM | POA: Insufficient documentation

## 2010-10-15 NOTE — Assessment & Plan Note (Signed)
No chest pain or exertional dyspnea.  Lipids are excellent on Crestor.  Would continue Crestor and low-dose ASA.

## 2010-10-15 NOTE — Assessment & Plan Note (Signed)
Diastolic pressure is elevated.  Patient is going to check her BP daily for 2 weeks, we will call her at that time to see what the readings are.

## 2010-10-27 ENCOUNTER — Telehealth: Payer: Self-pay | Admitting: *Deleted

## 2010-10-27 NOTE — Telephone Encounter (Signed)
Elevated blood pressure - Rachel Champagne, MD 10/15/2010 10:00 PM Signed  Diastolic pressure is elevated. Patient is going to check her BP daily for 2 weeks, we will call her at that time to see what the readings are.   10/27/10 LMTCB

## 2010-10-27 NOTE — Telephone Encounter (Signed)
I talked with pt. Pt states BP readings have been 107-140/ 68-80 range. I will forward to Dr Aundra Dubin for review.

## 2010-10-27 NOTE — Telephone Encounter (Signed)
Those number are ok.

## 2010-10-30 NOTE — Telephone Encounter (Signed)
I discussed with pt.

## 2010-11-15 DIAGNOSIS — N390 Urinary tract infection, site not specified: Secondary | ICD-10-CM | POA: Insufficient documentation

## 2010-11-15 DIAGNOSIS — R102 Pelvic and perineal pain: Secondary | ICD-10-CM | POA: Insufficient documentation

## 2010-11-15 DIAGNOSIS — N3941 Urge incontinence: Secondary | ICD-10-CM | POA: Insufficient documentation

## 2010-11-15 DIAGNOSIS — N3281 Overactive bladder: Secondary | ICD-10-CM | POA: Insufficient documentation

## 2010-12-04 ENCOUNTER — Ambulatory Visit (INDEPENDENT_AMBULATORY_CARE_PROVIDER_SITE_OTHER): Payer: BC Managed Care – PPO

## 2010-12-04 DIAGNOSIS — Z23 Encounter for immunization: Secondary | ICD-10-CM

## 2011-03-15 ENCOUNTER — Other Ambulatory Visit: Payer: Self-pay | Admitting: Family Medicine

## 2011-03-15 DIAGNOSIS — Z1231 Encounter for screening mammogram for malignant neoplasm of breast: Secondary | ICD-10-CM

## 2011-03-27 ENCOUNTER — Encounter: Payer: Self-pay | Admitting: Internal Medicine

## 2011-04-23 ENCOUNTER — Encounter: Payer: Self-pay | Admitting: Internal Medicine

## 2011-05-16 ENCOUNTER — Ambulatory Visit: Payer: BC Managed Care – PPO

## 2011-05-18 ENCOUNTER — Ambulatory Visit (AMBULATORY_SURGERY_CENTER): Payer: BC Managed Care – PPO | Admitting: *Deleted

## 2011-05-18 ENCOUNTER — Ambulatory Visit
Admission: RE | Admit: 2011-05-18 | Discharge: 2011-05-18 | Disposition: A | Discharge status: Home / Self Care | Payer: BC Managed Care – PPO | Source: Ambulatory Visit | Attending: Family Medicine | Admitting: Family Medicine

## 2011-05-18 VITALS — Ht 64.0 in | Wt 189.0 lb

## 2011-05-18 DIAGNOSIS — Z1231 Encounter for screening mammogram for malignant neoplasm of breast: Secondary | ICD-10-CM

## 2011-05-18 DIAGNOSIS — Z1211 Encounter for screening for malignant neoplasm of colon: Secondary | ICD-10-CM

## 2011-05-18 HISTORY — PX: COLONOSCOPY: SHX174

## 2011-05-18 MED ORDER — PEG-KCL-NACL-NASULF-NA ASC-C 100 G PO SOLR: ORAL | Status: DC

## 2011-05-21 ENCOUNTER — Encounter: Payer: Self-pay | Admitting: *Deleted

## 2011-05-21 ENCOUNTER — Encounter: Payer: Self-pay | Admitting: Internal Medicine

## 2011-06-01 ENCOUNTER — Ambulatory Visit (AMBULATORY_SURGERY_CENTER): Payer: BC Managed Care – PPO | Admitting: Internal Medicine

## 2011-06-01 ENCOUNTER — Encounter: Payer: Self-pay | Admitting: Internal Medicine

## 2011-06-01 VITALS — BP 115/63 | HR 73 | Temp 96.0°F | Resp 18 | Ht 64.0 in | Wt 189.0 lb

## 2011-06-01 DIAGNOSIS — Z800 Family history of malignant neoplasm of digestive organs: Secondary | ICD-10-CM

## 2011-06-01 DIAGNOSIS — K5730 Diverticulosis of large intestine without perforation or abscess without bleeding: Secondary | ICD-10-CM

## 2011-06-01 DIAGNOSIS — Z1211 Encounter for screening for malignant neoplasm of colon: Secondary | ICD-10-CM

## 2011-06-01 MED ORDER — SODIUM CHLORIDE 0.9 % IV SOLN: 500.0000 mL | INTRAVENOUS | Status: DC

## 2011-06-01 NOTE — Op Note (Signed)
[facility]. [place],   [id number]  COLONOSCOPY PROCEDURE REPORT  PATIENT:  Rachel Carlson, Rachel Carlson  MR#:  [id number] BIRTHDATE:  04/08/1945, 65 yrs. old  GENDER:  female ENDOSCOPIST:  Gatha Mayer, MD, [facility]  PROCEDURE DATE:  06/01/2011 PROCEDURE:  Colonoscopy [phone] ASA CLASS:  Class II INDICATIONS:  Elevated Risk Screening, family history of colon cancer brother diagnosed in 43's MEDICATIONS:   These medications were titrated to patient response per physician's verbal order, Fentanyl 75 mcg IV, Versed 5 mg IV  DESCRIPTION OF PROCEDURE:   After the risks benefits and alternatives of the procedure were thoroughly explained, informed consent was obtained.  Digital rectal exam was performed and revealed no abnormalities.   The LB [id number] endoscope was introduced through the anus and advanced to the cecum, which was identified by both the appendix and ileocecal valve, without limitations.  The quality of the prep was excellent, using MoviPrep.  The instrument was then slowly withdrawn as the colon was fully examined. <<PROCEDUREIMAGES>>  FINDINGS:  Moderate diverticulosis was found throughout the colon. This was otherwise a normal examination of the colon. Retroflexed views in the rectum revealed no abnormalities.    The time to cecum = 3:09 minutes. The scope was then withdrawn in 8:17 minutes from the cecum and the procedure completed. COMPLICATIONS:  None ENDOSCOPIC IMPRESSION: 1) Moderate diverticulosis throughout the colon 2) Otherwise normal examination, excellent prep  REPEAT EXAM:  In 5 year(s) for routine screening colonoscopy due to family history of colon cancer.  Gatha Mayer, MD, [facility]  CC:  The Patient  n. eSIGNED:   Gatha Mayer at 06/01/2011 01:47 PM  [place], Neoma Laming, [phone]

## 2011-06-01 NOTE — Progress Notes (Signed)
Patient did not experience any of the following events: a burn prior to discharge; a fall within the facility; wrong site/side/patient/procedure/implant event; or a hospital transfer or hospital admission upon discharge from the facility. (G8907) Patient did not have preoperative order for IV antibiotic SSI prophylaxis. (G8918)  

## 2011-06-01 NOTE — Patient Instructions (Signed)
YOU HAD AN ENDOSCOPIC PROCEDURE TODAY AT THE [facility]: Refer to the procedure report that was given to you for any specific questions about what was found during the examination.  If the procedure report does not answer your questions, please call your gastroenterologist to clarify.  If you requested that your care partner not be given the details of your procedure findings, then the procedure report has been included in a sealed envelope for you to review at your convenience later.  YOU SHOULD EXPECT: Some feelings of bloating in the abdomen. Passage of more gas than usual.  Walking can help get rid of the air that was put into your GI tract during the procedure and reduce the bloating. If you had a lower endoscopy (such as a colonoscopy or flexible sigmoidoscopy) you may notice spotting of blood in your stool or on the toilet paper. If you underwent a bowel prep for your procedure, then you may not have a normal bowel movement for a few days.  DIET: Your first meal following the procedure should be a light meal and then it is ok to progress to your normal diet.  A half-sandwich or bowl of soup is an example of a good first meal.  Heavy or fried foods are harder to digest and may make you feel nauseous or bloated.  Likewise meals heavy in dairy and vegetables can cause extra gas to form and this can also increase the bloating.  Drink plenty of fluids but you should avoid alcoholic beverages for 24 hours.  ACTIVITY: Your care partner should take you home directly after the procedure.  You should plan to take it easy, moving slowly for the rest of the day.  You can resume normal activity the day after the procedure however you should NOT DRIVE or use heavy machinery for 24 hours (because of the sedation medicines used during the test).    SYMPTOMS TO REPORT IMMEDIATELY: A gastroenterologist can be reached at any hour.  During normal business hours, 8:30 AM to 5:00 PM Monday through Friday,  call [phone].  After hours and on weekends, please call the GI answering service at [phone] who will take a message and have the physician on call contact you.   Following lower endoscopy (colonoscopy or flexible sigmoidoscopy):  Excessive amounts of blood in the stool  Significant tenderness or worsening of abdominal pains  Swelling of the abdomen that is new, acute  Fever of 100F or higher  Following upper endoscopy (EGD)  Vomiting of blood or coffee ground material  New chest pain or pain under the shoulder blades  Painful or persistently difficult swallowing  New shortness of breath  Fever of 100F or higher  Black, tarry-looking stools  FOLLOW UP: If any biopsies were taken you will be contacted by phone or by letter within the next 1-3 weeks.  Call your gastroenterologist if you have not heard about the biopsies in 3 weeks.  Our staff will call the home number listed on your records the next business day following your procedure to check on you and address any questions or concerns that you may have at that time regarding the information given to you following your procedure. This is a courtesy call and so if there is no answer at the home number and we have not heard from you through the emergency physician on call, we will assume that you have returned to your regular daily activities without incident.  SIGNATURES/CONFIDENTIALITY: You and/or your care   partner have signed paperwork which will be entered into your electronic medical record.  These signatures attest to the fact that that the information above on your After Visit Summary has been reviewed and is understood.  Full responsibility of the confidentiality of this discharge information lies with you and/or your care-partner.  

## 2011-06-04 ENCOUNTER — Telehealth: Payer: Self-pay | Admitting: *Deleted

## 2011-06-04 NOTE — Telephone Encounter (Signed)
  Follow up Call-  Call back number 06/01/2011  Post procedure Call Back phone  # [phone]  Permission to leave phone message Yes     Patient questions:  Do you have a fever, pain , or abdominal swelling? no Pain Score  0 *  Have you tolerated food without any problems? yes  Have you been able to return to your normal activities? yes  Do you have any questions about your discharge instructions: Diet   no Medications  no Follow up visit  no  Do you have questions or concerns about your Care? no  Actions: * If pain score is 4 or above: No action needed, pain <4.

## 2011-06-25 ENCOUNTER — Telehealth: Payer: Self-pay | Admitting: Family Medicine

## 2011-06-25 DIAGNOSIS — E785 Hyperlipidemia, unspecified: Secondary | ICD-10-CM

## 2011-06-25 DIAGNOSIS — Z0000 Encounter for general adult medical examination without abnormal findings: Secondary | ICD-10-CM

## 2011-06-25 NOTE — Telephone Encounter (Signed)
Message copied by Abner Greenspan on Mon Jun 25, 2011  9:54 PM ------      Message from: Ellamae Sia      Created: Wed Jun 20, 2011 10:21 AM      Regarding: labs for Tues 5-28       Patient is scheduled for CPX labs, please order future labs, Thanks , Karna Christmas

## 2011-06-26 ENCOUNTER — Other Ambulatory Visit (INDEPENDENT_AMBULATORY_CARE_PROVIDER_SITE_OTHER): Payer: BC Managed Care – PPO

## 2011-06-26 DIAGNOSIS — Z0000 Encounter for general adult medical examination without abnormal findings: Secondary | ICD-10-CM

## 2011-06-26 DIAGNOSIS — E785 Hyperlipidemia, unspecified: Secondary | ICD-10-CM

## 2011-06-26 LAB — CBC WITH DIFFERENTIAL/PLATELET
Basophils Absolute: 0 10*3/uL (ref 0.0–0.1)
Basophils Relative: 0.6 % (ref 0.0–3.0)
Eosinophils Relative: 2.4 % (ref 0.0–5.0)
HCT: 40 % (ref 36.0–46.0)
Hemoglobin: 13.5 g/dL (ref 12.0–15.0)
Lymphocytes Relative: 33.8 % (ref 12.0–46.0)
Lymphs Abs: 2 10*3/uL (ref 0.7–4.0)
Monocytes Relative: 7.9 % (ref 3.0–12.0)
Neutro Abs: 3.2 10*3/uL (ref 1.4–7.7)
RBC: 4.49 Mil/uL (ref 3.87–5.11)
RDW: 13.5 % (ref 11.5–14.6)

## 2011-06-26 LAB — COMPREHENSIVE METABOLIC PANEL
ALT: 18 U/L (ref 0–35)
BUN: 13 mg/dL (ref 6–23)
CO2: 25 mEq/L (ref 19–32)
Calcium: 9.4 mg/dL (ref 8.4–10.5)
Chloride: 108 mEq/L (ref 96–112)
Creatinine, Ser: 0.6 mg/dL (ref 0.4–1.2)
GFR: 105.11 mL/min (ref >60.00)
Glucose, Bld: 103 mg/dL — ABNORMAL HIGH (ref 70–99)
Total Bilirubin: 0.8 mg/dL (ref 0.3–1.2)

## 2011-06-26 LAB — LIPID PANEL
Cholesterol: 139 mg/dL (ref 0–200)
HDL: 65.5 mg/dL (ref >39.00)
Triglycerides: 93 mg/dL (ref 0.0–149.0)
VLDL: 18.6 mg/dL (ref 0.0–40.0)

## 2011-07-03 ENCOUNTER — Encounter: Payer: Self-pay | Admitting: Family Medicine

## 2011-07-03 ENCOUNTER — Ambulatory Visit (INDEPENDENT_AMBULATORY_CARE_PROVIDER_SITE_OTHER): Payer: BC Managed Care – PPO | Admitting: Family Medicine

## 2011-07-03 VITALS — BP 110/78 | HR 66 | Temp 97.9°F | Ht 62.75 in | Wt 190.0 lb

## 2011-07-03 DIAGNOSIS — E785 Hyperlipidemia, unspecified: Secondary | ICD-10-CM

## 2011-07-03 DIAGNOSIS — I83819 Varicose veins of unspecified lower extremities with pain: Secondary | ICD-10-CM | POA: Insufficient documentation

## 2011-07-03 DIAGNOSIS — Z0000 Encounter for general adult medical examination without abnormal findings: Secondary | ICD-10-CM

## 2011-07-03 DIAGNOSIS — I83893 Varicose veins of bilateral lower extremities with other complications: Secondary | ICD-10-CM

## 2011-07-03 NOTE — Assessment & Plan Note (Signed)
Some large and some spider veins on bilateral legs- worse around ankles with pain but no ulceration Written px for 15-20 mm hg hose to the waist  Wt loss recommended

## 2011-07-03 NOTE — Assessment & Plan Note (Addendum)
Reviewed health habits including diet and exercise and skin cancer prevention Also reviewed health mt list, fam hx and immunizations   Wellness labs rev in detail

## 2011-07-03 NOTE — Patient Instructions (Signed)
Work hard on diet and exercise - think about exercise 5 days per week for 30 minutes This will help with weight loss to prevent diabetes  Wear support hose  Cholesterol looks good

## 2011-07-03 NOTE — Assessment & Plan Note (Signed)
Very good control with crestor and diet  Disc goals for lipids and reasons to control them Rev labs with pt Rev low sat fat diet in detail  Disc starting an exercise program

## 2011-07-03 NOTE — Progress Notes (Signed)
Subjective:    Patient ID: Rachel Carlson, female    DOB: February 24, 1945, 66 y.o.   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- gets bladder pain occ Urge incontinence  No cervical changes or dysplasia  No gyn symptoms     mammo 4/13-- normal  Self exam -no lumps or changes Used to have fibrocystic changes - less sever now   colonosc 5/13 Gets one every 5 years due to brother with colon cancer  She does have diverticulosis  Eats lots of fiber - fruit and veg / in the summer    Hyperlipidemia  Lab Results  Component Value Date   CHOL 139 06/26/2011   CHOL 176 06/02/2010   CHOL 142 10/04/2009   Lab Results  Component Value Date   HDL 65.50 06/26/2011   HDL 56.40 06/02/2010   HDL 54.20 10/04/2009   Lab Results  Component Value Date   LDLCALC 55 06/26/2011   LDLCALC 88 06/02/2010   LDLCALC 71 10/04/2009   Lab Results  Component Value Date   TRIG 93.0 06/26/2011   TRIG 158.0* 06/02/2010   TRIG 84.0 10/04/2009   Lab Results  Component Value Date   CHOLHDL 2 06/26/2011   CHOLHDL 3 06/02/2010   CHOLHDL 3 10/04/2009   Lab Results  Component Value Date   LDLDIRECT 257.3 06/04/2008   LDLDIRECT 203.9 06/04/2007   on crestor and diet - is looking very good  Diet is always pretty good     Chemistry      Component Value Date/Time   NA 140 06/26/2011 1008   K 4.4 06/26/2011 1008   CL 108 06/26/2011 1008   CO2 25 06/26/2011 1008   BUN 13 06/26/2011 1008   CREATININE 0.6 06/26/2011 1008      Component Value Date/Time   CALCIUM 9.4 06/26/2011 1008   ALKPHOS 49 06/26/2011 1008   AST 17 06/26/2011 1008   ALT 18 06/26/2011 1008   BILITOT 0.8 06/26/2011 1008     Lab Results    Component Value Date   WBC 5.8 06/26/2011   HGB 13.5 06/26/2011   HCT 40.0 06/26/2011   MCV 89.0 06/26/2011   PLT 209.0 06/26/2011    Lab Results  Component Value Date   TSH 1.57 06/26/2011    Sugar is 103- does watch her sugar intake  Patient Active Problem List  Diagnoses  . HYPERLIPIDEMIA  . FASCIITIS, PLANTAR  . SYMPTOM, PALPITATIONS  . INCONTINENCE  . MIGRAINES, HX OF  . Routine general medical examination at a health care facility  . Skin lesion of chest wall  . Family history of coronary artery disease  . Elevated blood pressure   Past Medical History  Diagnosis Date  . Hyperlipidemia   . Obesity   . GERD (gastroesophageal reflux disease)   . PVC (premature ventricular contraction)     first dx by holter in 1980's, echo (6/10) EF 45-80%, normal diastolic fxn, normal size RV and fxn, mild MR, PASP 80mHg  . Chronic right ear pain     normal MRI  . Tubal pregnancy   .  Blood transfusion, without reported diagnosis    Past Surgical History  Procedure Date  . Ectopic pregnancy surgery 1980  . Abdominal hysterectomy     partial, fibroids, and prolapse (1998) bladder tack  . Bladder neck suspension 1998  . Colonoscopy    History  Substance Use Topics  . Smoking status: Never Smoker   . Smokeless tobacco: Never Used  . Alcohol Use: Not on file     rarely beer   Family History  Problem Relation Age of Onset  . Sudden death Father 68    "coronary arteriosclerosis" on death certificate  . Coronary artery disease Mother   . Heart attack Mother 21  . Transient ischemic attack Mother   . Diabetes type II Mother   . Sudden death Mother     ?cardiac  . Diabetes Mother   . Heart disease Mother     CAD MI in 70's//TIA's before that   . Arrhythmia Brother   . Colon cancer Brother 16    40's  . Breast cancer Sister   . Cancer Sister     Breast Cancer  . Colon polyps Sister   . Other Son     pituitary tumor    No Known Allergies Current Outpatient  Prescriptions on File Prior to Visit  Medication Sig Dispense Refill  . aspirin 81 MG tablet Take 81 mg by mouth daily.        . calcium citrate-vitamin D (CITRACAL+D) 315-200 MG-UNIT per tablet Take 1 tablet by mouth 2 (two) times daily.        . Cholecalciferol (VITAMIN D-3) 5000 UNITS TABS 3,000 Units. 1 tablet by mouth on Monday,Wednesday and Friday      . Coenzyme Q10 (CO Q 10 PO) Take 400 Units by mouth daily.        . Cranberry 300 MG tablet Take 300 mg by mouth daily.        . Grape Seed Extract 100 MG CAPS Take 1 capsule by mouth 2 (two) times daily.        . Misc Natural Products (GLUCOSAMINE CHONDROITIN ADV PO) Take 1 tablet by mouth 2 (two) times daily.        . Omega-3 Fatty Acids (FISH OIL TRIPLE STRENGTH) 1400 MG CAPS Take by mouth daily.        Marland Kitchen omeprazole (PRILOSEC OTC) 20 MG tablet Take 20 mg by mouth daily as needed. For 14 days as needed.      . rosuvastatin (CRESTOR) 20 MG tablet Take 1 tablet (20 mg total) by mouth at bedtime.  30 tablet  11   Current Facility-Administered Medications on File Prior to Visit  Medication Dose Route Frequency Provider Last Rate Last Dose  . 0.9 %  sodium chloride infusion  500 mL Intravenous Continuous Gatha Mayer, MD           Review of Systems Review of Systems  Constitutional: Negative for fever, appetite change, fatigue and unexpected weight change.  Eyes: Negative for pain and visual disturbance.  Respiratory: Negative for cough and shortness of breath.   Cardiovascular: Negative for cp or palpitations    Gastrointestinal: Negative for nausea, diarrhea and constipation.  Genitourinary: Negative for urgency and frequency.  Skin: Negative for pallor or rash   Neurological: Negative for weakness, light-headedness, numbness and headaches.  Hematological: Negative for adenopathy. Does not bruise/bleed easily.  Psychiatric/Behavioral: Negative for dysphoric mood. The patient is not nervous/anxious.         Objective:  Physical Exam  Constitutional: She appears well-developed and well-nourished. No distress.       Obese and well appearing   HENT:  Head: Normocephalic and atraumatic.  Right Ear: External ear normal.  Left Ear: External ear normal.  Nose: Nose normal.  Mouth/Throat: Oropharynx is clear and moist.       Deviated septum noted   Eyes: Conjunctivae and EOM are normal. Pupils are equal, round, and reactive to light. No scleral icterus.  Neck: Normal range of motion. Neck supple. No JVD present. Carotid bruit is not present. Erythema present. No thyromegaly present.  Cardiovascular: Normal rate, regular rhythm, normal heart sounds and intact distal pulses.  Exam reveals no gallop.   Pulmonary/Chest: Effort normal and breath sounds normal. No respiratory distress. She has no wheezes.  Abdominal: Soft. Bowel sounds are normal. She exhibits no distension, no abdominal bruit and no mass. There is no tenderness.  Genitourinary: No breast swelling, tenderness, discharge or bleeding.       Breast exam: No mass, nodules, thickening, tenderness, bulging, retraction, inflamation, nipple discharge or skin changes noted.  No axillary or clavicular LA.  Chaperoned exam.    Musculoskeletal: Normal range of motion. She exhibits no edema and no tenderness.  Lymphadenopathy:    She has no cervical adenopathy.  Neurological: She is alert. She has normal reflexes.  Skin: Skin is warm and dry. No rash noted. No erythema. No pallor.       Varicosities of different sizes bilat ankles and lower legs   Psychiatric: She has a normal mood and affect.          Assessment & Plan:

## 2011-10-15 ENCOUNTER — Ambulatory Visit (INDEPENDENT_AMBULATORY_CARE_PROVIDER_SITE_OTHER): Payer: BC Managed Care – PPO | Admitting: Cardiology

## 2011-10-15 ENCOUNTER — Encounter: Payer: Self-pay | Admitting: Cardiology

## 2011-10-15 VITALS — BP 132/84 | HR 76 | Ht 62.0 in | Wt 191.1 lb

## 2011-10-15 DIAGNOSIS — Z8249 Family history of ischemic heart disease and other diseases of the circulatory system: Secondary | ICD-10-CM

## 2011-10-15 DIAGNOSIS — R030 Elevated blood-pressure reading, without diagnosis of hypertension: Secondary | ICD-10-CM

## 2011-10-15 DIAGNOSIS — E785 Hyperlipidemia, unspecified: Secondary | ICD-10-CM

## 2011-10-15 DIAGNOSIS — R002 Palpitations: Secondary | ICD-10-CM

## 2011-10-15 MED ORDER — ROSUVASTATIN CALCIUM 20 MG PO TABS: 20.0000 mg | ORAL_TABLET | Freq: Every day | ORAL | Status: DC

## 2011-10-15 NOTE — Patient Instructions (Addendum)
Your physician has recommended that you wear a holter monitor. Holter monitors are medical devices that record the heart's electrical activity. Doctors most often use these monitors to diagnose arrhythmias. Arrhythmias are problems with the speed or rhythm of the heartbeat. The monitor is a small, portable device. You can wear one while you do your normal daily activities. This is usually used to diagnose what is causing palpitations/syncope (passing out). 24 hour monitor  Your physician wants you to follow-up in: 1 year with Dr Aundra Dubin. (September 2014).  You will receive a reminder letter in the mail two months in advance. If you don't receive a letter, please call our office to schedule the follow-up appointment.

## 2011-10-15 NOTE — Progress Notes (Signed)
Patient ID: Rachel Carlson, female   DOB: 05-14-1945, 66 y.o.   MRN: [id number] PCP: Dr. Glori Bickers  66 yo with hyperlipidemia and family history of sudden cardiac death and CAD returns for followup.   She continues to look after her husband who has PLS. No chest pain or exertional dyspnea.  She has been feeling more palpitations recently.  She has a history of PVCs.  She has stopped drinking caffeine.  No lightheadedness or syncope.   Labs (9/10):  LDL 58, HDL 57, AST 27, ALT 36 Labs (9/11): LDL 71, HDL 54 Labs (5/12): K 4.4, creatinine 0.7, TSH normal, LDL 88, HDL 56 Labs (5/13): K 4.4, creatinine 0.6, LDL 55, HDL 66  ECG: NSR, normal  Allergies (verified):  No Known Drug Allergies  Past Medical History: 1. Hyperlipidemia 2. 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  4. Echo (6/10): EF 63-01%, normal diastolic function, normal RV size and function, mild MR, PASP 33 mmHg.  5. GERD 6. Plantar fasciitis  Family History: Father: sudden death age 87- "coronary arteriosclerosis" on death certificate as cause of death.  Mother: CAD, MI in 81's, TIA's before that, sudden death- ? cardiac , DM 2  Siblings: Brother with arrythmia, colon cancer.  Sister has breast cancer.  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   Current Outpatient Prescriptions  Medication Sig Dispense Refill  . aspirin 81 MG tablet Take 81 mg by mouth daily.        . calcium citrate-vitamin D (CITRACAL+D) 315-200 MG-UNIT per tablet Take 1 tablet by mouth 2 (two) times daily.        . Cholecalciferol (VITAMIN D-3) 5000 UNITS TABS 3,000 Units. 1 tablet by mouth on Monday,Wednesday and Friday      . Coenzyme Q10 (CO Q 10 PO) Take 400 Units by mouth daily.        . Cranberry 300 MG tablet Take 300 mg by  mouth daily.        . Grape Seed Extract 100 MG CAPS Take 1 capsule by mouth 2 (two) times daily.        . Misc Natural Products (GLUCOSAMINE CHONDROITIN ADV PO) Take 1 tablet by mouth 2 (two) times daily.        . Omega-3 Fatty Acids (FISH OIL TRIPLE STRENGTH) 1400 MG CAPS Take by mouth daily.        Marland Kitchen omeprazole (PRILOSEC OTC) 20 MG tablet Take 20 mg by mouth daily as needed. For 14 days as needed.      . rosuvastatin (CRESTOR) 20 MG tablet Take 1 tablet (20 mg total) by mouth at bedtime.  30 tablet  11  . DISCONTD: rosuvastatin (CRESTOR) 20 MG tablet Take 1 tablet (20 mg total) by mouth at bedtime.  30 tablet  11   Current Facility-Administered Medications  Medication Dose Route Frequency Provider Last Rate Last Dose  . 0.9 %  sodium chloride infusion  500 mL Intravenous Continuous Gatha Mayer, MD        BP 132/84  Pulse 76  Ht 5' 2"  (1.575 m)  Wt 191 lb 1.9 oz (86.691 kg)  BMI 34.96 kg/m2  LMP 05/29/1996 General: NAD, overweight Neck: No JVD, no thyromegaly or thyroid nodule.  Lungs: Clear to auscultation bilaterally with normal respiratory  effort. CV: Nondisplaced PMI.  Heart regular S1/S2, no S3/S4, no murmur.  No peripheral edema.  No carotid bruit.  Normal pedal pulses.  Abdomen: Soft, nontender, no hepatosplenomegaly, no distention.  Neurologic: Alert and oriented x 3.  Psych: Normal affect. Extremities: No clubbing or cyanosis.   Assessment/Plan 1. Family history of coronary artery disease  No chest pain or exertional dyspnea. Lipids are excellent on Crestor. Would continue Crestor and low-dose ASA.  2. Elevated blood pressure BP seems to be reasonably controlled at this time.   3. Palpitations Increased compared to the past.  She does have a history of PVCs.  She has symptoms daily.  I will get a 24 hour holter monitor.  She has already stopped caffeine.   [name] 10/15/2011

## 2011-10-25 ENCOUNTER — Encounter (INDEPENDENT_AMBULATORY_CARE_PROVIDER_SITE_OTHER): Payer: BC Managed Care – PPO

## 2011-10-25 DIAGNOSIS — R002 Palpitations: Secondary | ICD-10-CM

## 2011-10-29 ENCOUNTER — Telehealth: Payer: Self-pay | Admitting: *Deleted

## 2011-10-29 NOTE — Telephone Encounter (Signed)
Dr Aundra Dubin reviewed monitor results done 10/25/11-10/26/11. Occ PVCs, may cause palpitations. If very bothersome , could use beta blocker-Toprol XL 68m daily. LMTCB

## 2011-10-30 NOTE — Telephone Encounter (Signed)
Spoke with pt and she is aware of results. She declined beta blocker at this time.

## 2012-03-24 ENCOUNTER — Other Ambulatory Visit: Payer: Self-pay | Admitting: Family Medicine

## 2012-03-24 DIAGNOSIS — Z1231 Encounter for screening mammogram for malignant neoplasm of breast: Secondary | ICD-10-CM

## 2012-03-25 ENCOUNTER — Encounter: Payer: Self-pay | Admitting: Internal Medicine

## 2012-03-25 ENCOUNTER — Ambulatory Visit (INDEPENDENT_AMBULATORY_CARE_PROVIDER_SITE_OTHER): Payer: BC Managed Care – PPO | Admitting: Internal Medicine

## 2012-03-25 VITALS — BP 124/84 | HR 68 | Ht 62.75 in | Wt 187.0 lb

## 2012-03-25 DIAGNOSIS — R059 Cough, unspecified: Secondary | ICD-10-CM

## 2012-03-25 DIAGNOSIS — R1310 Dysphagia, unspecified: Secondary | ICD-10-CM

## 2012-03-25 DIAGNOSIS — R05 Cough: Secondary | ICD-10-CM

## 2012-03-25 DIAGNOSIS — R1011 Right upper quadrant pain: Secondary | ICD-10-CM

## 2012-03-25 NOTE — Patient Instructions (Addendum)
You have been scheduled for an endoscopy with propofol. Please follow written instructions given to you at your visit today. If you use inhalers (even only as needed) or a CPAP machine, please bring them with you on the day of your procedure.  Thank you for choosing me and [facility].  Carl E. Gessner, M.D., FACG  

## 2012-03-25 NOTE — Progress Notes (Signed)
Subjective:    Patient ID: Rachel Carlson, female    DOB: 05-16-1945, 67 y.o.   MRN: [id number]  HPI This very nice lady is known to me from prior routine colonoscopy. She is c/o several months + of intermittent solid food dysphagia with regurgitation of impacted food bolus. Rice and chicken are common problems. She frequently coughs during/after swallowing. She also awakens choking and coughing  at night with suspected reflux. She has tried omeprazole but it caused diarrhea and a "stomachache". She has also used Zantac 150 bid but stopped it as not so helpful.  Has many many years of RUQ pain that is mild and chronic and not much of a bother.  No Known Allergies Outpatient Prescriptions Prior to Visit  Medication Sig Dispense Refill  . aspirin 81 MG tablet Take 81 mg by mouth daily.        . calcium citrate-vitamin D (CITRACAL+D) 315-200 MG-UNIT per tablet Take 1 tablet by mouth 2 (two) times daily.        . Cholecalciferol (VITAMIN D-3) 5000 UNITS TABS 3,000 Units. 1 tablet by mouth on Monday,Wednesday and Friday      . Coenzyme Q10 (CO Q 10 PO) Take 400 Units by mouth daily.        . Cranberry 300 MG tablet Take 300 mg by mouth daily.        . Grape Seed Extract 100 MG CAPS Take 1 capsule by mouth 2 (two) times daily.        . Misc Natural Products (GLUCOSAMINE CHONDROITIN ADV PO) Take 1 tablet by mouth 2 (two) times daily.        . Omega-3 Fatty Acids (FISH OIL TRIPLE STRENGTH) 1400 MG CAPS Take by mouth daily.        . rosuvastatin (CRESTOR) 20 MG tablet Take 1 tablet (20 mg total) by mouth at bedtime.  30 tablet  11  . omeprazole (PRILOSEC OTC) 20 MG tablet Take 20 mg by mouth daily as needed. For 14 days as needed.       Facility-Administered Medications Prior to Visit  Medication Dose Route Frequency Provider Last Rate Last Dose  . 0.9 %  sodium chloride infusion  500 mL Intravenous Continuous Gatha Mayer, MD       Past Medical History  Diagnosis Date  . Hyperlipidemia    . Obesity   . GERD (gastroesophageal reflux disease)   . PVC (premature ventricular contraction)     first dx by holter in 1980's, echo (6/10) EF 24-26%, normal diastolic fxn, normal size RV and fxn, mild MR, PASP 8mHg  . Chronic right ear pain     normal MRI  . Tubal pregnancy   . Blood transfusion, without reported diagnosis    Past Surgical History  Procedure Laterality Date  . Ectopic pregnancy surgery  1980  . Abdominal hysterectomy      partial, fibroids, and prolapse (1998) bladder tack  . Bladder neck suspension  1998  . Colonoscopy  05/18/2011    Dr. CSilvano Rusk  History   Social History  . Marital Status: Married    Spouse Name: N/A    Number of Children: 3  . Years of Education: N/A   Occupational History  . Veterinarin         Social History Main Topics  . Smoking status: Never Smoker   . Smokeless tobacco: Never Used  . Alcohol Use: Yes     Comment: rarely beer  . Drug Use:  No  . Sexually Active: None   Other Topics Concern  . None   Social History Narrative   Works long hours, Husband has PLS (less common type of ALS)   Family History  Problem Relation Age of Onset  . Sudden death Father 23    "coronary arteriosclerosis" on death certificate  . Coronary artery disease Mother   . Heart attack Mother 50  . Transient ischemic attack Mother   . Diabetes type II Mother   . Sudden death Mother     ?cardiac  . Diabetes Mother   . Heart disease Mother     CAD MI in 70's//TIA's before that   . Arrhythmia Brother   . Colon cancer Brother     40's  . Breast cancer Sister   . Breast cancer Sister   . Colon polyps Sister   . Other Son     pituitary tumor          Review of Systems     Objective:   Physical Exam General:  Well-developed, well-nourished and in no acute distress Eyes:  anicteric. ENT:   Mouth and posterior pharynx free of lesions.  Neck:   supple w/o thyromegaly or mass.  Lungs: Clear to auscultation  bilaterally. Heart:  S1S2, no rubs, murmurs, gallops. Abdomen:  soft, non-tender, no hepatosplenomegaly, hernia, or mass and BS+.  Lymph:  no cervical or supraclavicular adenopathy. Extremities:   no edema Skin   no rash. Neuro:  A&O x 3.  Psych:  appropriate mood and  Affect.     Assessment & Plan:  Dysphagia  Cough  RUQ pain  1. EGD and possible esophageal dilation for dysphagia and suspected GERD. The risks and benefits as well as alternatives of endoscopic procedure(s) have been discussed and reviewed. All questions answered. The patient agrees to proceed. RUQ pain likely not related to anything that would be seen at EGD though possibly. Has had an [place] many yrs ago - ok - we discussed reimaging and both agree low yield and she tolerates the chronic mild RUQ pain.  I appreciate the opportunity to care for this patient.  KF:EXMDY Tower, MD

## 2012-04-03 ENCOUNTER — Encounter: Payer: Self-pay | Admitting: Internal Medicine

## 2012-04-03 ENCOUNTER — Ambulatory Visit (AMBULATORY_SURGERY_CENTER): Payer: BC Managed Care – PPO | Admitting: Internal Medicine

## 2012-04-03 VITALS — BP 136/74 | HR 58 | Temp 97.1°F | Resp 20 | Ht 62.0 in | Wt 187.0 lb

## 2012-04-03 DIAGNOSIS — K269 Duodenal ulcer, unspecified as acute or chronic, without hemorrhage or perforation: Secondary | ICD-10-CM

## 2012-04-03 DIAGNOSIS — K222 Esophageal obstruction: Secondary | ICD-10-CM

## 2012-04-03 DIAGNOSIS — K2960 Other gastritis without bleeding: Secondary | ICD-10-CM

## 2012-04-03 DIAGNOSIS — K259 Gastric ulcer, unspecified as acute or chronic, without hemorrhage or perforation: Secondary | ICD-10-CM

## 2012-04-03 DIAGNOSIS — K449 Diaphragmatic hernia without obstruction or gangrene: Secondary | ICD-10-CM

## 2012-04-03 DIAGNOSIS — R1310 Dysphagia, unspecified: Secondary | ICD-10-CM

## 2012-04-03 HISTORY — PX: ESOPHAGOGASTRODUODENOSCOPY (EGD) WITH ESOPHAGEAL DILATION: SHX5812

## 2012-04-03 MED ORDER — SODIUM CHLORIDE 0.9 % IV SOLN: 500.0000 mL | INTRAVENOUS | Status: DC

## 2012-04-03 MED ORDER — PANTOPRAZOLE SODIUM 40 MG PO TBEC: 40.0000 mg | DELAYED_RELEASE_TABLET | Freq: Every day | ORAL | Status: DC

## 2012-04-03 NOTE — Progress Notes (Signed)
Patient did not experience any of the following events: a burn prior to discharge; a fall within the facility; wrong site/side/patient/procedure/implant event; or a hospital transfer or hospital admission upon discharge from the facility. (G8907) Patient did not have preoperative order for IV antibiotic SSI prophylaxis. (G8918)  

## 2012-04-03 NOTE — Op Note (Addendum)
[place]  [address] [place], [address]   ENDOSCOPY PROCEDURE REPORT  PATIENT: Rachel, Carlson  MR#: [id number] BIRTHDATE: 1945/07/27 , 66  yrs. old GENDER: Female ENDOSCOPIST: Gatha Mayer, MD, [facility] PROCEDURE DATE:  04/03/2012 PROCEDURE:  EGD w/ biopsy for H.pylori and balloon dilation of esophagus  <81m ASA CLASS:     Class II INDICATIONS:  Dysphagia. MEDICATIONS: Propofol (Diprivan) 140 mg IV, MAC sedation, administered by CRNA, and These medications were titrated to patient response per physician's verbal order TOPICAL ANESTHETIC: Cetacaine Spray  DESCRIPTION OF PROCEDURE: After the risks benefits and alternatives of the procedure were thoroughly explained, informed consent was obtained.  The LB GIF-H180 [id number] was introduced through the mouth and advanced to the second portion of the duodenum. Without limitations.  The instrument was slowly withdrawn as the mucosa was fully examined.        ESOPHAGUS: A stricture was found at the gastroesophageal junction. Smooth and benign-appearing.  The stenosis was traversable with the endoscope.   A 5 cm hiatal hernia was noted.   the stricture was successfully dilated with a 15-16.5-18 mm balloon.  STOMACH: Small erosion was found in the gastric antrum.  A biopsy was performed using cold forceps.  Sample obtained for helicobacter pylori testing using rapid urease test.  DUODENUM: two small erosions were found in the duodenal bulb.  The remainder of the upper endoscopy exam was otherwise normal. Retroflexed views revealed a hiatal hernia.     The scope was then withdrawn from the patient and the procedure completed.  COMPLICATIONS: There were no complications. ENDOSCOPIC IMPRESSION: 1.   Stricture was found at the gastroesophageal junction - dilated to 18 mm 2.   5 cm hiatal hernia 3.   Erosion in the gastric antrum; biopsy 4.   Erosions werefound in the duodenal bulb 5.   The  remainder of the upper endoscopy exam was otherwise normal  RECOMMENDATIONS: 1.  Clear liquids until 3 PM , then soft foods rest of day.  Resume prior diet tomorrow. 2.  PPI qam pantoprazole 40 mg 3.  Office will call with results    eSigned:  CGatha Mayer MD, FA[facility]03/06/2012 3:34 PM Revised: 04/03/2012 3:34 PM  CC:The Patient and MAbner Greenspan MD  PATIENT NAME:  [name], GluthMR#: [id number]

## 2012-04-03 NOTE — Patient Instructions (Addendum)
There was a stricture at the gastroesophageal junction - I dilated it to 18 mm. This should help significantly. You also have a 5 cm hiatal hernia, and erosions in the stomach and duodenum (probably from aspirin). I took gastric biopsies to see if you have Helicobacter pylori and will let you know.  Please follow diet instructions and start pantoprazole 40 mg daily, prescription sent.  Thank you for choosing me and [facility].  Gatha Mayer, MD, FACG  YOU HAD AN ENDOSCOPIC PROCEDURE TODAY AT [facility]: Refer to the procedure report that was given to you for any specific questions about what was found during the examination.  If the procedure report does not answer your questions, please call your gastroenterologist to clarify.  If you requested that your care partner not be given the details of your procedure findings, then the procedure report has been included in a sealed envelope for you to review at your convenience later.  YOU SHOULD EXPECT: Some feelings of bloating in the abdomen. Passage of more gas than usual.  Walking can help get rid of the air that was put into your GI tract during the procedure and reduce the bloating. If you had a lower endoscopy (such as a colonoscopy or flexible sigmoidoscopy) you may notice spotting of blood in your stool or on the toilet paper. If you underwent a bowel prep for your procedure, then you may not have a normal bowel movement for a few days.  DIET: Your first meal following the procedure should be a light meal and then it is ok to progress to your normal diet.  A half-sandwich or bowl of soup is an example of a good first meal.  Heavy or fried foods are harder to digest and may make you feel nauseous or bloated.  Likewise meals heavy in dairy and vegetables can cause extra gas to form and this can also increase the bloating.  Drink plenty of fluids but you should avoid alcoholic beverages for 24 hours.  ACTIVITY: Your  care partner should take you home directly after the procedure.  You should plan to take it easy, moving slowly for the rest of the day.  You can resume normal activity the day after the procedure however you should NOT DRIVE or use heavy machinery for 24 hours (because of the sedation medicines used during the test).    SYMPTOMS TO REPORT IMMEDIATELY: A gastroenterologist can be reached at any hour.  During normal business hours, 8:30 AM to 5:00 PM Monday through Friday, call [phone].  After hours and on weekends, please call the GI answering service at [phone] who will take a message and have the physician on call contact you.   Following upper endoscopy (EGD)  Vomiting of blood or coffee ground material  New chest pain or pain under the shoulder blades  Painful or persistently difficult swallowing  New shortness of breath  Fever of 100F or higher  Black, tarry-looking stools  FOLLOW UP: If any biopsies were taken you will be contacted by phone or by letter within the next 1-3 weeks.  Call your gastroenterologist if you have not heard about the biopsies in 3 weeks.  Our staff will call the home number listed on your records the next business day following your procedure to check on you and address any questions or concerns that you may have at that time regarding the information given to you following your procedure. This is a courtesy call and  so if there is no answer at the home number and we have not heard from you through the emergency physician on call, we will assume that you have returned to your regular daily activities without incident.  SIGNATURES/CONFIDENTIALITY: You and/or your care partner have signed paperwork which will be entered into your electronic medical record.  These signatures attest to the fact that that the information above on your After Visit Summary has been reviewed and is understood.  Full responsibility of the confidentiality of this discharge  information lies with you and/or your care-partner.  Dilation diet-handout given  Stricture-handout given  Start pantoprazole

## 2012-04-04 ENCOUNTER — Telehealth: Payer: Self-pay

## 2012-04-04 NOTE — Telephone Encounter (Signed)
Number has been disconnected or no longer in service.

## 2012-04-07 LAB — HELICOBACTER PYLORI SCREEN-BIOPSY: UREASE: NEGATIVE

## 2012-04-09 NOTE — Progress Notes (Signed)
Quick Note:  H. Pylori negative Let her know and as long as ok set up rev 6-8 weeks Otherwise let me know if having problems  No letter or recall form [facility] ______

## 2012-05-19 ENCOUNTER — Ambulatory Visit
Admission: RE | Admit: 2012-05-19 | Discharge: 2012-05-19 | Disposition: A | Discharge status: Home / Self Care | Payer: BC Managed Care – PPO | Source: Ambulatory Visit | Attending: Family Medicine | Admitting: Family Medicine

## 2012-05-19 DIAGNOSIS — Z1231 Encounter for screening mammogram for malignant neoplasm of breast: Secondary | ICD-10-CM

## 2012-05-20 ENCOUNTER — Encounter: Payer: Self-pay | Admitting: *Deleted

## 2012-06-05 ENCOUNTER — Ambulatory Visit (INDEPENDENT_AMBULATORY_CARE_PROVIDER_SITE_OTHER): Payer: BC Managed Care – PPO | Admitting: Internal Medicine

## 2012-06-05 ENCOUNTER — Encounter: Payer: Self-pay | Admitting: Internal Medicine

## 2012-06-05 VITALS — BP 110/72 | HR 74 | Ht 62.0 in | Wt 192.0 lb

## 2012-06-05 DIAGNOSIS — K222 Esophageal obstruction: Secondary | ICD-10-CM

## 2012-06-05 DIAGNOSIS — K219 Gastro-esophageal reflux disease without esophagitis: Secondary | ICD-10-CM

## 2012-06-05 NOTE — Patient Instructions (Addendum)
Please take your pantoprazole one daily before supper.   Thank you for choosing me and [facility].  Gatha Mayer, M.D., [facility]

## 2012-06-05 NOTE — Progress Notes (Signed)
  Subjective:    Patient ID: Rachel Carlson, female    DOB: August 04, 1945, 67 y.o.   MRN: [id number]  HPI Here for f/u after EGD and balloon dilation of esophageal stricture (17m). Had gastric erosion and duodenal erosions - H pylori negative. Started pantoprazole 40 mg qd. Still with mild dysphagia - rice and chicken move slow but do not impact. Overall better. Eats at 730 PM, often falls asleep on sofa. Has heartburn/reflux a few times a month. Medications, allergies, past medical history, past surgical history, family history and social history are reviewed and updated in the EMR.   Review of Systems As above    Objective:   Physical Exam WDWN NAD    Assessment & Plan:  GERD (gastroesophageal reflux disease)  Esophageal stricture  1. Improved but not resolved. 2. Try pantoprazole before supper 3. REV 1 year routine, see me sooner if not better/worse - would need to decide on Ba swallow vs. EGD/dili and maybe manometry but that would be last. Would probably try repeat dili possibly up to 20 mm if persistent dysphagia

## 2012-06-18 ENCOUNTER — Telehealth: Payer: Self-pay

## 2012-06-18 NOTE — Telephone Encounter (Signed)
Pt request to change CPX lab appt to 06/24/12. Advised pt changed CPX lab appt to 06/24/12 at 8:40 am.

## 2012-06-24 ENCOUNTER — Other Ambulatory Visit (INDEPENDENT_AMBULATORY_CARE_PROVIDER_SITE_OTHER): Payer: BC Managed Care – PPO

## 2012-06-24 ENCOUNTER — Telehealth: Payer: Self-pay | Admitting: Family Medicine

## 2012-06-24 DIAGNOSIS — Z0000 Encounter for general adult medical examination without abnormal findings: Secondary | ICD-10-CM

## 2012-06-24 DIAGNOSIS — E785 Hyperlipidemia, unspecified: Secondary | ICD-10-CM

## 2012-06-24 LAB — CBC WITH DIFFERENTIAL/PLATELET
Basophils Absolute: 0 10*3/uL (ref 0.0–0.1)
Basophils Relative: 0.6 % (ref 0.0–3.0)
Eosinophils Absolute: 0.1 10*3/uL (ref 0.0–0.7)
Lymphocytes Relative: 33.4 % (ref 12.0–46.0)
MCHC: 34.2 g/dL (ref 30.0–36.0)
Monocytes Relative: 7.5 % (ref 3.0–12.0)
Neutrophils Relative %: 56.4 % (ref 43.0–77.0)
RBC: 4.46 Mil/uL (ref 3.87–5.11)
RDW: 12.9 % (ref 11.5–14.6)

## 2012-06-24 LAB — COMPREHENSIVE METABOLIC PANEL
ALT: 18 U/L (ref 0–35)
AST: 18 U/L (ref 0–37)
Albumin: 3.9 g/dL (ref 3.5–5.2)
CO2: 25 mEq/L (ref 19–32)
Calcium: 9.2 mg/dL (ref 8.4–10.5)
Chloride: 106 mEq/L (ref 96–112)
Potassium: 4.2 mEq/L (ref 3.5–5.1)

## 2012-06-24 LAB — LIPID PANEL: Total CHOL/HDL Ratio: 2

## 2012-06-24 LAB — TSH: TSH: 2.43 u[IU]/mL (ref 0.35–5.50)

## 2012-06-24 NOTE — Telephone Encounter (Signed)
Message copied by Abner Greenspan on Tue Jun 24, 2012  7:28 AM ------      Message from: Marchia Bond      Created: Thu Jun 19, 2012  1:06 PM      Regarding: Cpx labs 06/24/12 Tues       Please order  future cpx labs for pt's upcoming lab appt.      Thanks      Tasha       ------

## 2012-06-29 ENCOUNTER — Telehealth: Payer: Self-pay | Admitting: Family Medicine

## 2012-06-29 DIAGNOSIS — Z0000 Encounter for general adult medical examination without abnormal findings: Secondary | ICD-10-CM

## 2012-06-29 DIAGNOSIS — E785 Hyperlipidemia, unspecified: Secondary | ICD-10-CM

## 2012-06-29 NOTE — Telephone Encounter (Signed)
Message copied by Abner Greenspan on Sun Jun 29, 2012  6:21 PM ------      Message from: Ellamae Sia      Created: Mon Jun 16, 2012  4:15 PM      Regarding: Lab orders for Monday, 6.2.14       Patient is scheduled for CPX labs, please order future labs, Thanks , Terri       ------

## 2012-06-30 ENCOUNTER — Other Ambulatory Visit: Payer: BC Managed Care – PPO

## 2012-07-07 ENCOUNTER — Ambulatory Visit (INDEPENDENT_AMBULATORY_CARE_PROVIDER_SITE_OTHER): Payer: BC Managed Care – PPO | Admitting: Family Medicine

## 2012-07-07 ENCOUNTER — Encounter: Payer: Self-pay | Admitting: Family Medicine

## 2012-07-07 VITALS — BP 128/82 | HR 82 | Temp 98.8°F | Ht 62.75 in | Wt 190.0 lb

## 2012-07-07 DIAGNOSIS — Z0000 Encounter for general adult medical examination without abnormal findings: Secondary | ICD-10-CM

## 2012-07-07 DIAGNOSIS — E785 Hyperlipidemia, unspecified: Secondary | ICD-10-CM

## 2012-07-07 NOTE — Patient Instructions (Addendum)
Work on [employer] and exercise  Think about getting a yoga program - I like [employer]  Weight loss would help general health  Labs look good

## 2012-07-07 NOTE — Progress Notes (Signed)
Subjective:    Patient ID: Rachel Carlson, female    DOB: [birth year], 67 y.o.   MRN: [id number]  HPI Here for health maintenance exam and to review chronic medical problems    Wt is down 2 lb with bmi of 33  In general is feeling ok  Has some ache in back at the end of the day -not severe  House cleans and walks for exercise  Needs more structured exercise - ? Would benefit from yoga / tense muscles also    Pap 1/08 Hysterectomy following for fibroids  Flu vaccine 11/12 mammog 4/14 nl  Self exam-no lumps or changes -does not check often  Td 12/07 colonosc 5/13 ok- recall 5 y due to fam hx   Zoster vaccine 5/11  Hyperlipidemia Lab Results  Component Value Date   CHOL 146 06/24/2012   CHOL 139 06/26/2011   CHOL 176 06/02/2010   Lab Results  Component Value Date   HDL 61.60 06/24/2012   HDL 65.50 06/26/2011   HDL 56.40 06/02/2010   Lab Results  Component Value Date   LDLCALC 60 06/24/2012   LDLCALC 55 06/26/2011   LDLCALC 88 06/02/2010   Lab Results  Component Value Date   TRIG 124.0 06/24/2012   TRIG 93.0 06/26/2011   TRIG 158.0* 06/02/2010   Lab Results  Component Value Date   CHOLHDL 2 06/24/2012   CHOLHDL 2 06/26/2011   CHOLHDL 3 06/02/2010   Lab Results  Component Value Date   LDLDIRECT 257.3 06/04/2008   LDLDIRECT 203.9 06/04/2007      Review of Systems Review of Systems  Constitutional: Negative for fever, appetite change, and unexpected weight change.  Eyes: Negative for pain and visual disturbance.  Respiratory: Negative for cough and shortness of breath.   Cardiovascular: Negative for cp or palpitations    Gastrointestinal: Negative for nausea, diarrhea and constipation.  Genitourinary: Negative for urgency and frequency.  Skin: Negative for pallor or rash   Neurological: Negative for weakness, light-headedness, numbness and headaches.  Hematological: Negative for adenopathy. Does not bruise/bleed easily.  Psychiatric/Behavioral: Negative for dysphoric  mood. The patient is not nervous/anxious.         Objective:   Physical Exam  Constitutional: She appears well-developed and well-nourished. No distress.  obese and well appearing   HENT:  Head: Normocephalic and atraumatic.  Right Ear: External ear normal.  Left Ear: External ear normal.  Nose: Nose normal.  Mouth/Throat: Oropharynx is clear and moist.  Eyes: Conjunctivae and EOM are normal. Pupils are equal, round, and reactive to light. Right eye exhibits no discharge. Left eye exhibits no discharge. No scleral icterus.  Neck: Normal range of motion. Neck supple. No JVD present. Carotid bruit is not present. No thyromegaly present.  Cardiovascular: Normal rate, regular rhythm, normal heart sounds and intact distal pulses.  Exam reveals no gallop.   Pulmonary/Chest: Effort normal and breath sounds normal. No respiratory distress. She has no wheezes. She exhibits no tenderness.  Abdominal: Soft. Bowel sounds are normal. She exhibits no distension, no abdominal bruit and no mass. There is no tenderness.  Genitourinary: No breast swelling, tenderness, discharge or bleeding.  Breast exam: No mass, nodules, thickening, tenderness, bulging, retraction, inflamation, nipple discharge or skin changes noted.  No axillary or clavicular LA.  Chaperoned exam.    Musculoskeletal: She exhibits no edema and no tenderness.  Lymphadenopathy:    She has no cervical adenopathy.  Neurological: She is alert. She has normal reflexes. No cranial nerve deficit.  She exhibits normal muscle tone. Coordination normal.  Skin: Skin is warm and dry. No rash noted. No erythema. No pallor.  Psychiatric: She has a normal mood and affect.          Assessment & Plan:

## 2012-07-10 NOTE — Assessment & Plan Note (Signed)
Reviewed health habits including diet and exercise and skin cancer prevention Also reviewed health mt list, fam hx and immunizations  Wellness labs reviewed

## 2012-07-10 NOTE — Assessment & Plan Note (Signed)
Disc goals for lipids and reasons to control them Rev labs with pt Rev low sat fat diet in detail   

## 2012-10-16 ENCOUNTER — Encounter: Payer: Self-pay | Admitting: *Deleted

## 2012-10-21 ENCOUNTER — Ambulatory Visit (INDEPENDENT_AMBULATORY_CARE_PROVIDER_SITE_OTHER): Payer: BC Managed Care – PPO | Admitting: Cardiology

## 2012-10-21 ENCOUNTER — Encounter: Payer: Self-pay | Admitting: Cardiology

## 2012-10-21 VITALS — BP 130/80 | HR 67 | Ht 62.5 in | Wt 193.0 lb

## 2012-10-21 DIAGNOSIS — R030 Elevated blood-pressure reading, without diagnosis of hypertension: Secondary | ICD-10-CM

## 2012-10-21 DIAGNOSIS — E785 Hyperlipidemia, unspecified: Secondary | ICD-10-CM

## 2012-10-21 DIAGNOSIS — Z8249 Family history of ischemic heart disease and other diseases of the circulatory system: Secondary | ICD-10-CM

## 2012-10-21 DIAGNOSIS — R002 Palpitations: Secondary | ICD-10-CM

## 2012-10-21 MED ORDER — ROSUVASTATIN CALCIUM 20 MG PO TABS: 20.0000 mg | ORAL_TABLET | Freq: Every day | ORAL | Status: DC

## 2012-10-21 NOTE — Patient Instructions (Addendum)
Your physician wants you to follow-up in: 1 year with Dr Aundra Dubin. (September 2015)  You will receive a reminder letter in the mail two months in advance. If you don't receive a letter, please call our office to schedule the follow-up appointment.

## 2012-10-22 NOTE — Progress Notes (Signed)
Patient ID: Rachel Carlson, female   DOB: 07/18/1945, 67 y.o.   MRN: [id number] PCP: Dr. Glori Bickers  67 yo with hyperlipidemia and family history of sudden cardiac death and CAD returns for followup.   She continues to look after her husband who has PLS. No chest pain or exertional dyspnea.  She continues to have stable palpitations that occur whether or not she drinks caffeine.  She has a history of PVCs. Holter in 10/13 showed occasional PVCs.  No lightheadedness or syncope.   Labs (9/10):  LDL 58, HDL 57, AST 27, ALT 36 Labs (9/11): LDL 71, HDL 54 Labs (5/12): K 4.4, creatinine 0.7, TSH normal, LDL 88, HDL 56 Labs (5/13): K 4.4, creatinine 0.6, LDL 55, HDL 66 Labs (5/14): LDL 60, HDL 62  ECG: NSR, normal other than 1 PVC  Allergies (verified):  No Known Drug Allergies  Past Medical History: 1. Hyperlipidemia 2. 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  Holter (10/13) with occasional PVCs 4. Echo (6/10): EF 61-95%, normal diastolic function, normal RV size and function, mild MR, PASP 33 mmHg.  5. GERD with esophageal stricture.  6. Plantar fasciitis  Family History: Father: sudden death age 32- "coronary arteriosclerosis" on death certificate as cause of death.  Mother: CAD, MI in 33's, TIA's before that, sudden death- ? cardiac , DM 2  Siblings: Brother with arrythmia, colon cancer.  Sister has breast cancer.  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   Current Outpatient Prescriptions  Medication Sig Dispense Refill  . aspirin 81 MG tablet Take 81 mg by mouth daily.        . calcium citrate-vitamin D (CITRACAL+D) 315-200 MG-UNIT per tablet Take 1 tablet by mouth daily.       . Cholecalciferol (VITAMIN D-3) 5000 UNITS TABS 3,000 Units. 1 tablet by mouth on  Monday,Wednesday and Friday      . Coenzyme Q10 (CO Q 10 PO) Take 400 Units by mouth daily.        . Cranberry 300 MG tablet Take 300 mg by mouth daily.        . Grape Seed Extract 100 MG CAPS Take 1 capsule by mouth daily.       . Misc Natural Products (GLUCOSAMINE CHONDROITIN ADV PO) Take 1 tablet by mouth 2 (two) times daily.        . Omega-3 Fatty Acids (FISH OIL TRIPLE STRENGTH) 1400 MG CAPS Take by mouth daily.        . pantoprazole (PROTONIX) 40 MG tablet Take 40 mg by mouth daily. Take one before supper daily.      . ranitidine (ZANTAC) 150 MG tablet Take 150 mg by mouth as needed.      . rosuvastatin (CRESTOR) 20 MG tablet Take 1 tablet (20 mg total) by mouth at bedtime.  90 tablet  3   Current Facility-Administered Medications  Medication Dose Route Frequency Provider Last Rate Last Dose  . 0.9 %  sodium chloride infusion  500 mL Intravenous Continuous Gatha Mayer, MD        BP 130/80  Pulse 67  Ht 5' 2.5" (1.588 m)  Wt 87.544 kg (193 lb)  BMI 34.72 kg/m2  LMP 05/29/1996 General: NAD, overweight Neck: No JVD, no thyromegaly or thyroid nodule.  Lungs: Clear to auscultation bilaterally with normal respiratory effort. CV: Nondisplaced PMI.  Heart regular S1/S2, no S3/S4, no murmur.  No peripheral edema.  No carotid bruit.  Normal pedal pulses.  Abdomen: Soft, nontender, no hepatosplenomegaly, no distention.  Neurologic: Alert and oriented x 3.  Psych: Normal affect. Extremities: No clubbing or cyanosis.   Assessment/Plan 1. Family history of coronary artery disease  No chest pain or exertional dyspnea. Lipids are excellent on Crestor. Would continue Crestor and low-dose ASA.  2. Elevated blood pressure BP seems to be reasonably controlled at this time.   3. Palpitations Stable and manageable.  Likely due to PVCs.  Cutting back on caffeine has not affected them.  No changes at this time.   [name] 10/22/2012

## 2012-10-27 ENCOUNTER — Other Ambulatory Visit: Payer: Self-pay | Admitting: Cardiology

## 2012-11-27 ENCOUNTER — Telehealth: Payer: Self-pay

## 2012-11-27 NOTE — Telephone Encounter (Signed)
Pt left v/m requesting written note giving permission for [facility] Dept to give pt pneumoccal vaccine. Pt said Health Dept can give but needs permission from doctor for pt to receive.Please advise.

## 2012-11-28 NOTE — Telephone Encounter (Signed)
Pt called again requesting a note to give permission for the Health Dept to give the pneumonia vaccine.  [phone] pt phone

## 2012-12-01 NOTE — Telephone Encounter (Signed)
I was out of the office when she called Letter is in IN box

## 2012-12-01 NOTE — Telephone Encounter (Signed)
Left voicemail letting pt know letter ready for pick-up

## 2013-01-15 DIAGNOSIS — Z23 Encounter for immunization: Secondary | ICD-10-CM | POA: Diagnosis not present

## 2013-03-03 ENCOUNTER — Ambulatory Visit (INDEPENDENT_AMBULATORY_CARE_PROVIDER_SITE_OTHER): Payer: Medicare Other | Admitting: Internal Medicine

## 2013-03-03 ENCOUNTER — Encounter: Payer: Self-pay | Admitting: Internal Medicine

## 2013-03-03 ENCOUNTER — Other Ambulatory Visit (HOSPITAL_COMMUNITY): Payer: Self-pay | Admitting: Internal Medicine

## 2013-03-03 ENCOUNTER — Telehealth: Payer: Self-pay

## 2013-03-03 VITALS — BP 122/80 | HR 68 | Ht 62.5 in | Wt 196.6 lb

## 2013-03-03 DIAGNOSIS — R1310 Dysphagia, unspecified: Secondary | ICD-10-CM

## 2013-03-03 DIAGNOSIS — R05 Cough: Secondary | ICD-10-CM | POA: Diagnosis not present

## 2013-03-03 DIAGNOSIS — K219 Gastro-esophageal reflux disease without esophagitis: Secondary | ICD-10-CM | POA: Insufficient documentation

## 2013-03-03 DIAGNOSIS — R059 Cough, unspecified: Secondary | ICD-10-CM

## 2013-03-03 DIAGNOSIS — K6289 Other specified diseases of anus and rectum: Secondary | ICD-10-CM

## 2013-03-03 DIAGNOSIS — N3941 Urge incontinence: Secondary | ICD-10-CM

## 2013-03-03 MED ORDER — AMBULATORY NON FORMULARY MEDICATION: Status: DC

## 2013-03-03 NOTE — Progress Notes (Signed)
         Subjective:    Patient ID: Rachel Carlson, female    DOB: 24-Nov-1945, 68 y.o.   MRN: [id number]  HPI Patient is here for followup of GERD and she also is describing painful defecation. She has a history of dysphagia thought related to GERD and a stricture, dilated to 18 mm in early 2014. I saw her in May of 2014 at which point she was on pantoprazole and ranitidine. However these causes loose stools as she stopped around September. She has not contacted me since then. She started to have mild solid dysphagia again. She cause bloating and after eating as well as she did before. Essentially the seems the same or slightly worse as she was.   She is also describing her mitten painful defecation - gets terrible anal and rectal pain and unable to pass stool- stools not hard, and sometimes difficult to empty rectal vault and she has to "dig it out".  Is seeing urogynecologist at [facility] urinary incontinence - + exercises Rx "made me worse" Urinates 2-3 x a night Medications, allergies, past medical history, past surgical history, family history and social history are reviewed and updated in the EMR.   Review of Systems As above    Objective:   Physical Exam General:  NAD - obese Abdomen:  soft and nontender, BS+  Rectal:  Female staff present  Anoderm inspection revealed small posterior tag Anal wink was pesent Digital exam revealed normal resting tone and voluntary squeeze. There was no sharp pain No mass or rectocele present. Simulated defecation with valsalva revealed appropriate abdominal contraction and descent.   Data Reviewed:  Prior upper endoscopy notes, colonoscopy notes, GI notes 2014        Assessment & Plan:

## 2013-03-03 NOTE — Assessment & Plan Note (Signed)
Trial of nitroglycerin 0.125% ointment twice a day. It sounds like she may be having anal or rectal spasms. Not sure if there is a relationship to pelvic floor issues or anything with her stress urinary incontinence.

## 2013-03-03 NOTE — Assessment & Plan Note (Signed)
She was intolerant of pantoprazole with loose stools. Probably need to try another PPI, await his other workup.

## 2013-03-03 NOTE — Telephone Encounter (Signed)
Informed patient that her radiology test have been changed to [facility] and will be done 03/10/13 at 10:00 AM, arrive at 9:45 Am for the Modified Barium Swallow and the Barium Swallow with tablet exam will be at 11:00am.

## 2013-03-03 NOTE — Assessment & Plan Note (Signed)
She may have some sort of pelvic floor dysfunction that is related to her rectal pain.

## 2013-03-03 NOTE — Assessment & Plan Note (Signed)
I'm not sure where this is coming from. She will cough when she tries to swallow saliva at times, after eating during eating. It could be atypical reflux or not or multifactorial. I've explained and he can be very difficult to sort these things out. Modified barium swallow be undertaken first.

## 2013-03-03 NOTE — Patient Instructions (Signed)
You have been scheduled for a Barium Esophogram at [facility]iology (1st floor of the hospital) on 03/06/13 at 10:00am. Please arrive 15 minutes prior to your appointment for registration. Make certain not to have anything to eat or drink 6 hours prior to your test. If you need to reschedule for any reason, please contact radiology at [phone] to do so. __________________________________________________________________ A barium swallow is an examination that concentrates on views of the esophagus. This tends to be a double contrast exam (barium and two liquids which, when combined, create a gas to distend the wall of the oesophagus) or single contrast (non-ionic iodine based). The study is usually tailored to your symptoms so a good history is essential. Attention is paid during the study to the form, structure and configuration of the esophagus, looking for functional disorders (such as aspiration, dysphagia, achalasia, motility and reflux) EXAMINATION You may be asked to change into a gown, depending on the type of swallow being performed. A radiologist and radiographer will perform the procedure. The radiologist will advise you of the type of contrast selected for your procedure and direct you during the exam. You will be asked to stand, sit or lie in several different positions and to hold a small amount of fluid in your mouth before being asked to swallow while the imaging is performed .In some instances you may be asked to swallow barium coated marshmallows to assess the motility of a solid food bolus. The exam can be recorded as a digital or video fluoroscopy procedure. POST PROCEDURE It will take 1-2 days for the barium to pass through your system. To facilitate this, it is important, unless otherwise directed, to increase your fluids for the next 24-48hrs and to resume your normal diet.  This test typically takes about 30 minutes to  perform. __________________________________________________________________________________  Rachel Carlson have been scheduled for a modified barium swallow on ________ at ________. Please arrive 15 minutes prior to your test for registration. You will go to _______ Radiology (1st Floor) for your appointment. Please refrain from eating or drinking anything 4 hours prior to your test. Should you need to cancel or reschedule your appointment, please contact [phone] [facility]) or [phone] [facility] [place] [place]). _____________________________________________________________________ A Modified Barium Swallow Study, or MBS, is a special x-ray that is taken to check swallowing skills. It is carried out by a Stage manager and a Psychologist, clinical (SLP). During this test, yourmouth, throat, and esophagus, a muscular tube which connects your mouth to your stomach, is checked. The test will help you, your doctor, and the SLP plan what types of foods and liquids are easier for you to swallow. The SLP will also identify positions and ways to help you swallow more easily and safely. What will happen during an MBS? You will be taken to an x-ray room and seated comfortably. You will be asked to swallow small amounts of food and liquid mixed with barium. Barium is a liquid or paste that allows images of your mouth, throat and esophagus to be seen on x-ray. The x-ray captures moving images of the food you are swallowing as it travels from your mouth through your throat and into your esophagus. This test helps identify whether food or liquid is entering your lungs (aspiration). The test also shows which part of your mouth or throat lacks strength or coordination to move the food or liquid in the right direction. This test typically takes 30 minutes to 1 hour to complete. _______________________________________________________________________  We have faxed an rx  to [facility] for nitroglycerine.   I appreciate  the opportunity to care for you.

## 2013-03-06 ENCOUNTER — Ambulatory Visit (HOSPITAL_COMMUNITY): Payer: Medicare Other

## 2013-03-10 ENCOUNTER — Ambulatory Visit (HOSPITAL_COMMUNITY)
Admission: RE | Admit: 2013-03-10 | Discharge: 2013-03-10 | Disposition: A | Discharge status: Home / Self Care | Payer: Medicare Other | Source: Ambulatory Visit | Attending: Internal Medicine | Admitting: Internal Medicine

## 2013-03-10 DIAGNOSIS — R059 Cough, unspecified: Secondary | ICD-10-CM

## 2013-03-10 DIAGNOSIS — R1310 Dysphagia, unspecified: Secondary | ICD-10-CM

## 2013-03-10 DIAGNOSIS — K219 Gastro-esophageal reflux disease without esophagitis: Secondary | ICD-10-CM

## 2013-03-10 DIAGNOSIS — R05 Cough: Secondary | ICD-10-CM

## 2013-03-10 NOTE — Progress Notes (Signed)
Quick Note:  Please check with radiology re: report for DG esophagus ______

## 2013-03-10 NOTE — Procedures (Signed)
Objective Swallowing Evaluation: Modified Barium Swallowing Study  Patient Details  Name: Rachel Carlson MRN: [id number] Date of Birth: 1945-05-20  Today's Date: 03/10/2013 Time: 1005-1035 SLP Time Calculation (min): 30 min  Past Medical History:  Past Medical History  Diagnosis Date  . Hyperlipidemia   . Obesity   . GERD (gastroesophageal reflux disease)   . PVC (premature ventricular contraction)     first dx by holter in 1980's, echo (6/10) EF 02-54%, normal diastolic fxn, normal size RV and fxn, mild MR, PASP 42mHg  . Chronic right ear pain     normal MRI  . Tubal pregnancy   . Blood transfusion, without reported diagnosis   . Esophageal stricture   . Fibroids     uterine, history of  . History of uterine prolapse   . VT (ventricular tachycardia)   . NSTEMI (non-ST elevated myocardial infarction)   . Intermittent atrial flutter   . Urge urinary incontinence 06/03/2007    Qualifier: Diagnosis of  By: BMarcelino ScotCMA, AAuburn Bilberry    Past Surgical History:  Past Surgical History  Procedure Laterality Date  . Ectopic pregnancy surgery  1980  . Partial hysterectomy      abdominal, fibroids, and prolapse (1998) bladder tack  . Bladder neck suspension  1998  . Colonoscopy  05/18/2011    Dr. CSilvano Rusk . Esophagogastroduodenoscopy (egd) with esophageal dilation  04/03/12    Dr. CSilvano Rusk  HPI:  68year old female referred for outpatient MBS to evaluate oral and pharyngeal swallow function and safety.  PMH significant for GERD, esophageal dilation (2014)     Assessment / Plan / Recommendation Clinical Impression  Dysphagia Diagnosis: Within Functional Limits Clinical impression: Normal oral and pharyngeal swallow.      Treatment Recommendation  No treatment recommended at this time    Diet Recommendation Thin liquid;Regular   Liquid Administration via: Cup;Straw Medication Administration: Whole meds with liquid Supervision: Patient able to self  feed Compensations: Slow rate;Small sips/bites;Follow solids with liquid Postural Changes and/or Swallow Maneuvers: Seated upright 90 degrees;Upright 30-60 min after meal    Other  Recommendations Oral Care Recommendations: Oral care BID   Follow Up Recommendations  None    Frequency and Duration   n/a     Pertinent Vitals/Pain n/a    SLP Swallow Goals  n/a   General Date of Onset: 03/10/13 HPI: 68year old female referred for outpatient MBS to evaluate oral and pharyngeal swallow function and safety.  PMH significant for GERD, esophageal dilation (2014) Type of Study: Modified Barium Swallowing Study Reason for Referral: Objectively evaluate swallowing function Diet Prior to this Study: Regular;Thin liquids Temperature Spikes Noted: No Respiratory Status: Room air History of Recent Intubation: No Behavior/Cognition: Alert;Cooperative;Pleasant mood Oral Cavity - Dentition: Adequate natural dentition Oral Motor / Sensory Function: Within functional limits Self-Feeding Abilities: Able to feed self Patient Positioning: Upright in chair Baseline Vocal Quality: Clear Volitional Cough: Strong Volitional Swallow: Able to elicit Anatomy: Within functional limits Pharyngeal Secretions: Not observed secondary MBS    Reason for Referral Objectively evaluate swallowing function   Oral Phase Oral Preparation/Oral Phase Oral Phase: WFL   Pharyngeal Phase Pharyngeal Phase Pharyngeal Phase: Within functional limits  Cervical Esophageal Phase    GO    Cervical Esophageal Phase Cervical Esophageal Phase: Impaired Cervical Esophageal Phase - Comment Cervical Esophageal Comment: Pt has history of esopahgeal issues.  She is having an esophagram today.    Functional Assessment Tool Used: NOMS Functional  Limitations: Swallowing Swallow Current Status (I1658): 0 percent impaired, limited or restricted Swallow Goal Status (K0634): 0 percent impaired, limited or restricted Swallow  Discharge Status [phone]): 0 percent impaired, limited or restricted   Ayush Boulet B. Quentin [facility], CCC-SLP [phone]  Shonna Chock 03/10/2013, 10:56 AM

## 2013-03-12 NOTE — Progress Notes (Signed)
Quick Note:  Main finding on this work-up is she still has A stricture in the distal esophagus  I recommend she schedule an EGD with balloon dilation larger than the last time ([facility]) ______

## 2013-03-16 ENCOUNTER — Ambulatory Visit (AMBULATORY_SURGERY_CENTER): Payer: Self-pay | Admitting: *Deleted

## 2013-03-16 VITALS — Ht 62.5 in | Wt 193.0 lb

## 2013-03-16 DIAGNOSIS — R1310 Dysphagia, unspecified: Secondary | ICD-10-CM

## 2013-03-16 NOTE — Progress Notes (Signed)
Patient denies any allergies to eggs or soy. Patient denies any problems with anesthesia.

## 2013-03-27 ENCOUNTER — Encounter: Payer: Medicare Other | Admitting: Internal Medicine

## 2013-03-31 ENCOUNTER — Ambulatory Visit (AMBULATORY_SURGERY_CENTER): Payer: Medicare Other | Admitting: Internal Medicine

## 2013-03-31 ENCOUNTER — Encounter: Payer: Self-pay | Admitting: Internal Medicine

## 2013-03-31 VITALS — BP 163/85 | HR 65 | Temp 97.7°F | Resp 15 | Ht 62.25 in | Wt 193.0 lb

## 2013-03-31 DIAGNOSIS — Z8679 Personal history of other diseases of the circulatory system: Secondary | ICD-10-CM | POA: Diagnosis not present

## 2013-03-31 DIAGNOSIS — K219 Gastro-esophageal reflux disease without esophagitis: Secondary | ICD-10-CM | POA: Diagnosis not present

## 2013-03-31 DIAGNOSIS — K222 Esophageal obstruction: Secondary | ICD-10-CM | POA: Diagnosis not present

## 2013-03-31 DIAGNOSIS — R1310 Dysphagia, unspecified: Secondary | ICD-10-CM

## 2013-03-31 DIAGNOSIS — K449 Diaphragmatic hernia without obstruction or gangrene: Secondary | ICD-10-CM

## 2013-03-31 MED ORDER — RABEPRAZOLE SODIUM 20 MG PO TBEC: 20.0000 mg | DELAYED_RELEASE_TABLET | Freq: Every day | ORAL | Status: DC

## 2013-03-31 MED ORDER — SODIUM CHLORIDE 0.9 % IV SOLN: 500.0000 mL | INTRAVENOUS | Status: DC

## 2013-03-31 NOTE — Progress Notes (Signed)
Called to room to assist during endoscopic procedure.  Patient ID and intended procedure confirmed with present staff. Received instructions for my participation in the procedure from the performing physician.  

## 2013-03-31 NOTE — Progress Notes (Signed)
I was unable to reconcile the meds.  The Epic would not let me change to the appropriate format at this time.  Most meds are listed on her med list.

## 2013-03-31 NOTE — Op Note (Signed)
[facility]  [address] [place], [address]   ENDOSCOPY PROCEDURE REPORT  PATIENT: Rachel Carlson, Rachel Carlson  MR#: [id number] BIRTHDATE: 1945/08/07 , 42  yrs. old GENDER: Female ENDOSCOPIST: Gatha Mayer, MD, [facility] PROCEDURE DATE:  03/31/2013 PROCEDURE:  Esophagoscopy  w/ balloon dilation < 30 mm ASA CLASS:     Class II INDICATIONS:  Dysphagia.   Therapeutic procedure. MEDICATIONS: propofol (Diprivan) 160m IV, MAC sedation, administered by CRNA, and These medications were titrated to patient response per physician's verbal order TOPICAL ANESTHETIC: none  DESCRIPTION OF PROCEDURE: After the risks benefits and alternatives of the procedure were thoroughly explained, informed consent was obtained.  The LB [id number] was introduced through the mouth and advanced to the stomach body. Without limitations.  The instrument was slowly withdrawn as the mucosa was fully examined.        ESOPHAGUS: A stricture was found at the gastroesophageal junction. Ring-like but thicker. The stenosis was traversable with the endoscope.  It was dilated with a TTS balloon to 20 mm.  A 5 cm hiatal hernia was noted.  The remainder of the upper endoscopy exam was otherwise normal. Retroflexed views revealed a hiatal hernia.     The scope was then withdrawn from the patient and the procedure completed.  COMPLICATIONS: There were no complications. ENDOSCOPIC IMPRESSION: 1.   Stricture was found at the gastroesophageal junction and dilated to 20 mm 2.   5 cm hiatal hernia 3.   The remainder of the upper endoscopy exam was otherwise normal  RECOMMENDATIONS: 1.  Clear liquids until , then soft foods rest iof day.  Resume prior diet tomorrow. 2.  Start rabeprazole 20 mg every AM (Rx sent) [place] with symptom update in 2 months    eSigned:  CGatha Mayer MD, F[facility]03/03/2013 12:30 PM   CC:The Patient

## 2013-03-31 NOTE — Patient Instructions (Addendum)
I dilated the stricture to 20 mm (18 last year). I have prescribed rabeprazole to block acid - I hope this helps and does not cause side effects.  Please contact me in about 2 months with a symptom update.  I appreciate the opportunity to care for you. Gatha Mayer, MD, FACG   YOU HAD AN ENDOSCOPIC PROCEDURE TODAY AT [facility]: Refer to the procedure report that was given to you for any specific questions about what was found during the examination.  If the procedure report does not answer your questions, please call your gastroenterologist to clarify.  If you requested that your care partner not be given the details of your procedure findings, then the procedure report has been included in a sealed envelope for you to review at your convenience later.  YOU SHOULD EXPECT: Some feelings of bloating in the abdomen. Passage of more gas than usual.  Walking can help get rid of the air that was put into your GI tract during the procedure and reduce the bloating. If you had a lower endoscopy (such as a colonoscopy or flexible sigmoidoscopy) you may notice spotting of blood in your stool or on the toilet paper. If you underwent a bowel prep for your procedure, then you may not have a normal bowel movement for a few days.  DIET:  Nothing to eat or drink until 1:20 pm. 1:20 until 2:20 only clear liquids.  After 2:20 only soft foods until the morning. Resume your regular diet in am.  ACTIVITY: Your care partner should take you home directly after the procedure.  You should plan to take it easy, moving slowly for the rest of the day.  You can resume normal activity the day after the procedure however you should NOT DRIVE or use heavy machinery for 24 hours (because of the sedation medicines used during the test).    SYMPTOMS TO REPORT IMMEDIATELY: A gastroenterologist can be reached at any hour.  During normal business hours, 8:30 AM to 5:00 PM Monday through Friday, call [phone]  [phone].  After hours and on weekends, please call the GI answering service at [phone] who will take a message and have the physician on call contact you.   Following upper endoscopy (EGD)  Vomiting of blood or coffee ground material  New chest pain or pain under the shoulder blades  Painful or persistently difficult swallowing  New shortness of breath  Fever of 100F or higher  Black, tarry-looking stools  FOLLOW UP: If any biopsies were taken you will be contacted by phone or by letter within the next 1-3 weeks.  Call your gastroenterologist if you have not heard about the biopsies in 3 weeks.  Our staff will call the home number listed on your records the next business day following your procedure to check on you and address any questions or concerns that you may have at that time regarding the information given to you following your procedure. This is a courtesy call and so if there is no answer at the home number and we have not heard from you through the emergency physician on call, we will assume that you have returned to your regular daily activities without incident.  SIGNATURES/CONFIDENTIALITY: You and/or your care partner have signed paperwork which will be entered into your electronic medical record.  These signatures attest to the fact that that the information above on your After Visit Summary has been reviewed and is understood.  Full responsibility of the confidentiality  of this discharge information lies with you and/or your care-partner.

## 2013-04-01 ENCOUNTER — Telehealth: Payer: Self-pay | Admitting: *Deleted

## 2013-04-01 NOTE — Telephone Encounter (Signed)
  Follow up Call-  Call back number 03/31/2013 04/03/2012 06/01/2011  Post procedure Call Back phone  # [phone]  Permission to leave phone message Yes Yes Yes     Patient questions:  Do you have a fever, pain , or abdominal swelling? no Pain Score  0 *  Have you tolerated food without any problems? yes  Have you been able to return to your normal activities? yes  Do you have any questions about your discharge instructions: Diet   no Medications  no Follow up visit  no  Do you have questions or concerns about your Care? no  Actions: * If pain score is 4 or above: No action needed, pain <4.

## 2013-04-10 ENCOUNTER — Other Ambulatory Visit: Payer: Self-pay | Admitting: Family Medicine

## 2013-04-10 ENCOUNTER — Other Ambulatory Visit: Payer: Self-pay

## 2013-04-10 DIAGNOSIS — N959 Unspecified menopausal and perimenopausal disorder: Secondary | ICD-10-CM

## 2013-04-10 DIAGNOSIS — Z1231 Encounter for screening mammogram for malignant neoplasm of breast: Secondary | ICD-10-CM

## 2013-04-16 DIAGNOSIS — H2510 Age-related nuclear cataract, unspecified eye: Secondary | ICD-10-CM | POA: Diagnosis not present

## 2013-05-20 ENCOUNTER — Encounter (INDEPENDENT_AMBULATORY_CARE_PROVIDER_SITE_OTHER): Payer: Self-pay

## 2013-05-20 ENCOUNTER — Ambulatory Visit
Admission: RE | Admit: 2013-05-20 | Discharge: 2013-05-20 | Disposition: A | Discharge status: Home / Self Care | Payer: Medicare Other | Source: Ambulatory Visit | Attending: Family Medicine | Admitting: Family Medicine

## 2013-05-20 ENCOUNTER — Ambulatory Visit
Admission: RE | Admit: 2013-05-20 | Discharge: 2013-05-20 | Disposition: A | Discharge status: Home / Self Care | Payer: Medicare Other | Source: Ambulatory Visit

## 2013-05-20 DIAGNOSIS — N959 Unspecified menopausal and perimenopausal disorder: Secondary | ICD-10-CM

## 2013-05-20 DIAGNOSIS — Z1231 Encounter for screening mammogram for malignant neoplasm of breast: Secondary | ICD-10-CM

## 2013-05-20 DIAGNOSIS — M810 Age-related osteoporosis without current pathological fracture: Secondary | ICD-10-CM | POA: Diagnosis not present

## 2013-05-20 LAB — HM DEXA SCAN

## 2013-05-22 ENCOUNTER — Encounter: Payer: Self-pay | Admitting: *Deleted

## 2013-05-24 ENCOUNTER — Encounter: Payer: Self-pay | Admitting: Family Medicine

## 2013-05-24 DIAGNOSIS — M8580 Other specified disorders of bone density and structure, unspecified site: Secondary | ICD-10-CM | POA: Insufficient documentation

## 2013-05-24 DIAGNOSIS — M810 Age-related osteoporosis without current pathological fracture: Secondary | ICD-10-CM | POA: Insufficient documentation

## 2013-05-25 ENCOUNTER — Encounter: Payer: Self-pay | Admitting: *Deleted

## 2013-05-25 ENCOUNTER — Encounter: Payer: Self-pay | Admitting: Family Medicine

## 2013-06-02 ENCOUNTER — Telehealth: Payer: Self-pay

## 2013-06-02 ENCOUNTER — Telehealth: Payer: Self-pay | Admitting: Internal Medicine

## 2013-06-02 NOTE — Telephone Encounter (Signed)
Left message for patient to call back  

## 2013-06-02 NOTE — Telephone Encounter (Signed)
Filled out and faxed form for prior authorization to [facility] , fax # [phone] for Rachel Carlson's Rabeprazole Sodium 20 mg.  Dx: GERD, 530.81.  She's tried and failed pantoprazole, omeprazole, zantac.  These were ineffective and caused side effects ( abdominal pain, loose stools).  We will await an answer.

## 2013-06-03 MED ORDER — OMEPRAZOLE 20 MG PO CPDR: 20.0000 mg | DELAYED_RELEASE_CAPSULE | Freq: Every day | ORAL | Status: DC

## 2013-06-03 NOTE — Telephone Encounter (Signed)
Agree 

## 2013-06-03 NOTE — Telephone Encounter (Signed)
She was calling with an update on her GERD.  Symptoms improved on omeprazole, she does have some intermittent "acid reflux attacks" about 6 times a month.  She does have some loose stools on omeprazole, but it is tolerable.  She is requesting a refill on omeprazole on a paper rx.  I have mailed this to her.  She is aware to call for an office visit

## 2013-06-04 MED ORDER — DEXLANSOPRAZOLE 60 MG PO CPDR: DELAYED_RELEASE_CAPSULE | ORAL | Status: DC

## 2013-06-04 NOTE — Telephone Encounter (Signed)
Try Dexilant 60 mg daily before breakfast # [phone]

## 2013-06-04 NOTE — Telephone Encounter (Signed)
Her rabeprazole sodium has been denied, they cover Nexium (Brand) and Dexilant.  Would you like to do an appeal or switch her to one of the covered drugs.  Please advise, thank you.

## 2013-06-04 NOTE — Telephone Encounter (Signed)
Spoke with patient and will mail her a printed rx for the Dexilant per her request.  She said she has been paying out of pocket for the aciphex generic about $50.00 per month.  She said she will try the dexilant and let us know if any problems taking it.  In error we mailed her a rx for omeprazole out yesterday, she will throw that one away when she gets it.

## 2013-06-18 ENCOUNTER — Telehealth: Payer: Self-pay | Admitting: Internal Medicine

## 2013-06-18 MED ORDER — RABEPRAZOLE SODIUM 20 MG PO TBEC: 20.0000 mg | DELAYED_RELEASE_TABLET | Freq: Every day | ORAL | Status: DC

## 2013-06-18 NOTE — Telephone Encounter (Signed)
Spoke with patient and she said the dexilant is too expensive so she said she will just pay for the generic aciphex.  She was given a printed rx per her request today.

## 2013-06-29 ENCOUNTER — Other Ambulatory Visit (INDEPENDENT_AMBULATORY_CARE_PROVIDER_SITE_OTHER): Payer: Medicare Other

## 2013-06-29 DIAGNOSIS — E785 Hyperlipidemia, unspecified: Secondary | ICD-10-CM | POA: Diagnosis not present

## 2013-06-29 DIAGNOSIS — Z0000 Encounter for general adult medical examination without abnormal findings: Secondary | ICD-10-CM | POA: Diagnosis not present

## 2013-06-29 DIAGNOSIS — R030 Elevated blood-pressure reading, without diagnosis of hypertension: Secondary | ICD-10-CM | POA: Diagnosis not present

## 2013-06-29 DIAGNOSIS — M810 Age-related osteoporosis without current pathological fracture: Secondary | ICD-10-CM | POA: Diagnosis not present

## 2013-06-29 DIAGNOSIS — IMO0001 Reserved for inherently not codable concepts without codable children: Secondary | ICD-10-CM

## 2013-06-29 LAB — CBC WITH DIFFERENTIAL/PLATELET
BASOS PCT: 0.6 % (ref 0.0–3.0)
Basophils Absolute: 0 10*3/uL (ref 0.0–0.1)
EOS ABS: 0.2 10*3/uL (ref 0.0–0.7)
EOS PCT: 3.1 % (ref 0.0–5.0)
HCT: 39.6 % (ref 36.0–46.0)
Hemoglobin: 13.3 g/dL (ref 12.0–15.0)
LYMPHS PCT: 31.7 % (ref 12.0–46.0)
Lymphs Abs: 1.8 10*3/uL (ref 0.7–4.0)
MCHC: 33.6 g/dL (ref 30.0–36.0)
MCV: 88.5 fl (ref 78.0–100.0)
Monocytes Absolute: 0.5 10*3/uL (ref 0.1–1.0)
Monocytes Relative: 9 % (ref 3.0–12.0)
NEUTROS PCT: 55.6 % (ref 43.0–77.0)
Neutro Abs: 3.1 10*3/uL (ref 1.4–7.7)
PLATELETS: 214 10*3/uL (ref 150.0–400.0)
RBC: 4.48 Mil/uL (ref 3.87–5.11)
RDW: 13.2 % (ref 11.5–15.5)
WBC: 5.5 10*3/uL (ref 4.0–10.5)

## 2013-06-29 LAB — LIPID PANEL
CHOL/HDL RATIO: 3
Cholesterol: 158 mg/dL (ref 0–200)
HDL: 55.9 mg/dL (ref >39.00)
LDL Cholesterol: 75 mg/dL (ref 0–99)
TRIGLYCERIDES: 137 mg/dL (ref 0.0–149.0)
VLDL: 27.4 mg/dL (ref 0.0–40.0)

## 2013-06-29 LAB — COMPREHENSIVE METABOLIC PANEL
ALBUMIN: 3.9 g/dL (ref 3.5–5.2)
ALT: 22 U/L (ref 0–35)
AST: 21 U/L (ref 0–37)
Alkaline Phosphatase: 50 U/L (ref 39–117)
BUN: 12 mg/dL (ref 6–23)
CALCIUM: 9.2 mg/dL (ref 8.4–10.5)
CHLORIDE: 107 meq/L (ref 96–112)
CO2: 24 mEq/L (ref 19–32)
Creatinine, Ser: 0.7 mg/dL (ref 0.4–1.2)
GFR: 90.6 mL/min (ref >60.00)
Glucose, Bld: 93 mg/dL (ref 70–99)
POTASSIUM: 4 meq/L (ref 3.5–5.1)
SODIUM: 138 meq/L (ref 135–145)
Total Bilirubin: 0.7 mg/dL (ref 0.2–1.2)
Total Protein: 6.7 g/dL (ref 6.0–8.3)

## 2013-06-29 LAB — TSH: TSH: 2.67 u[IU]/mL (ref 0.35–4.50)

## 2013-07-01 ENCOUNTER — Other Ambulatory Visit: Payer: BC Managed Care – PPO

## 2013-07-08 ENCOUNTER — Ambulatory Visit (INDEPENDENT_AMBULATORY_CARE_PROVIDER_SITE_OTHER): Payer: Medicare Other | Admitting: Family Medicine

## 2013-07-08 ENCOUNTER — Encounter: Payer: Self-pay | Admitting: Family Medicine

## 2013-07-08 ENCOUNTER — Ambulatory Visit (INDEPENDENT_AMBULATORY_CARE_PROVIDER_SITE_OTHER)
Admission: RE | Admit: 2013-07-08 | Discharge: 2013-07-08 | Disposition: A | Discharge status: Home / Self Care | Payer: Medicare Other | Source: Ambulatory Visit | Attending: Family Medicine | Admitting: Family Medicine

## 2013-07-08 ENCOUNTER — Encounter: Payer: BC Managed Care – PPO | Admitting: Family Medicine

## 2013-07-08 VITALS — BP 118/72 | HR 63 | Temp 97.8°F | Ht 63.0 in | Wt 194.5 lb

## 2013-07-08 DIAGNOSIS — M810 Age-related osteoporosis without current pathological fracture: Secondary | ICD-10-CM | POA: Diagnosis not present

## 2013-07-08 DIAGNOSIS — R1030 Lower abdominal pain, unspecified: Secondary | ICD-10-CM | POA: Insufficient documentation

## 2013-07-08 DIAGNOSIS — R079 Chest pain, unspecified: Secondary | ICD-10-CM | POA: Diagnosis not present

## 2013-07-08 DIAGNOSIS — E785 Hyperlipidemia, unspecified: Secondary | ICD-10-CM | POA: Diagnosis not present

## 2013-07-08 DIAGNOSIS — R109 Unspecified abdominal pain: Secondary | ICD-10-CM | POA: Diagnosis not present

## 2013-07-08 DIAGNOSIS — Z0000 Encounter for general adult medical examination without abnormal findings: Secondary | ICD-10-CM | POA: Diagnosis not present

## 2013-07-08 DIAGNOSIS — R0781 Pleurodynia: Secondary | ICD-10-CM

## 2013-07-08 DIAGNOSIS — S298XXA Other specified injuries of thorax, initial encounter: Secondary | ICD-10-CM | POA: Diagnosis not present

## 2013-07-08 LAB — POCT URINALYSIS DIPSTICK
Bilirubin, UA: NEGATIVE
Blood, UA: NEGATIVE
Glucose, UA: NEGATIVE
Ketones, UA: NEGATIVE
Leukocytes, UA: NEGATIVE
Nitrite, UA: NEGATIVE
Protein, UA: NEGATIVE
Spec Grav, UA: 1.01
Urobilinogen, UA: 0.2
pH, UA: 6.5

## 2013-07-08 MED ORDER — RALOXIFENE HCL 60 MG PO TABS: 60.0000 mg | ORAL_TABLET | Freq: Every day | ORAL | Status: DC

## 2013-07-08 NOTE — Patient Instructions (Signed)
Leave urinalysis on the way out (if this is normal and pain continues we would consider an ultrasound) Try evista for osteoporosis  Xray of left ribs today  Take care of yourself

## 2013-07-08 NOTE — Progress Notes (Signed)
Pre visit review using our clinic review tool, if applicable. No additional management support is needed unless otherwise documented below in the visit note. 

## 2013-07-08 NOTE — Progress Notes (Signed)
Subjective:    Patient ID: Rachel Carlson, female    DOB: Jul 11, 1945, 68 y.o.   MRN: [id number]  HPI I have personally reviewed the Medicare Annual Wellness questionnaire and have noted 1. The patient's medical and social history 2. Their use of alcohol, tobacco or illicit drugs 3. Their current medications and supplements 4. The patient's functional ability including ADL's, fall risks, home safety risks and hearing or visual             impairment. 5. Diet and physical activities 6. Evidence for depression or mood disorders  The patients weight, height, BMI have been recorded in the chart and visual acuity is per eye clinic.  I have made referrals, counseling and provided education to the patient based review of the above and I have provided the pt with a written personalized care plan for preventive services.  Doing fair overall  Is sad - her sister went back to [place] - will really miss her  This is hard on her - and she does not want to move - tearful about this   She gets pain in her L ribs (injured a year ago)-comes and goes  Had a pain in her low abd 2 weeks ago - burning pain / is improved but still sore (above scar)  No redness or rash  Hurts to twist and turn  See scanned forms.  Routine anticipatory guidance given to patient.  See health maintenance. Colon cancer screening 5/13 colonosc- recall in 2018 due to family hx  Breast cancer screening 4/15 mammogram nl  Self breast exam- no lumps Had partial hyst  Flu vaccine 11/14 health dept Tetanus vaccine 12/07 Pneumovax got it at the health dept 1/15  Zoster vaccine 5/11   Advance directive-does not have a living will set up Cognitive function addressed- see scanned forms- and if abnormal then additional documentation follows. No worries about memory (any problems she has she is aware of - and things come back to her quickly)  PMH and SH reviewed  Meds, vitals, and allergies reviewed.   ROS: See HPI.  Otherwise  negative.    dexa 4/15 LS -3.4 T score  FN -1.8  No recent fractures  Is interested in tx to prev fx     Hyperlipidemia Lab Results  Component Value Date   CHOL 158 06/29/2013   CHOL 146 06/24/2012   CHOL 139 06/26/2011   Lab Results  Component Value Date   HDL 55.90 06/29/2013   HDL 61.60 06/24/2012   HDL 65.50 06/26/2011   Lab Results  Component Value Date   LDLCALC 75 06/29/2013   LDLCALC 60 06/24/2012   LDLCALC 55 06/26/2011   Lab Results  Component Value Date   TRIG 137.0 06/29/2013   TRIG 124.0 06/24/2012   TRIG 93.0 06/26/2011   Lab Results  Component Value Date   CHOLHDL 3 06/29/2013   CHOLHDL 2 06/24/2012   CHOLHDL 2 06/26/2011   Lab Results  Component Value Date   LDLDIRECT 257.3 06/04/2008   LDLDIRECT 203.9 06/04/2007   fairly stable and well controlled Diet is ok   GERD- is on aciphex  Had EGD Has to continue it  Seeing gi No ulcers   Results for orders placed in visit on 06/29/13  CBC WITH DIFFERENTIAL      Result Value Ref Range   WBC 5.5  4.0 - 10.5 K/uL   RBC 4.48  3.87 - 5.11 Mil/uL   Hemoglobin 13.3  12.0 - 15.0  g/dL   HCT 39.6  36.0 - 46.0 %   MCV 88.5  78.0 - 100.0 fl   MCHC 33.6  30.0 - 36.0 g/dL   RDW 13.2  11.5 - 15.5 %   Platelets 214.0  150.0 - 400.0 K/uL   Neutrophils Relative % 55.6  43.0 - 77.0 %   Lymphocytes Relative 31.7  12.0 - 46.0 %   Monocytes Relative 9.0  3.0 - 12.0 %   Eosinophils Relative 3.1  0.0 - 5.0 %   Basophils Relative 0.6  0.0 - 3.0 %   Neutro Abs 3.1  1.4 - 7.7 K/uL   Lymphs Abs 1.8  0.7 - 4.0 K/uL   Monocytes Absolute 0.5  0.1 - 1.0 K/uL   Eosinophils Absolute 0.2  0.0 - 0.7 K/uL   Basophils Absolute 0.0  0.0 - 0.1 K/uL  COMPREHENSIVE METABOLIC PANEL      Result Value Ref Range   Sodium 138  135 - 145 mEq/L   Potassium 4.0  3.5 - 5.1 mEq/L   Chloride 107  96 - 112 mEq/L   CO2 24  19 - 32 mEq/L   Glucose, Bld 93  70 - 99 mg/dL   BUN 12  6 - 23 mg/dL   Creatinine, Ser 0.7  0.4 - 1.2 mg/dL   Total Bilirubin 0.7   0.2 - 1.2 mg/dL   Alkaline Phosphatase 50  39 - 117 U/L   AST 21  0 - 37 U/L   ALT 22  0 - 35 U/L   Total Protein 6.7  6.0 - 8.3 g/dL   Albumin 3.9  3.5 - 5.2 g/dL   Calcium 9.2  8.4 - 10.5 mg/dL   GFR 90.60  >60.00 mL/min  LIPID PANEL      Result Value Ref Range   Cholesterol 158  0 - 200 mg/dL   Triglycerides 137.0  0.0 - 149.0 mg/dL   HDL 55.90  >39.00 mg/dL   VLDL 27.4  0.0 - 40.0 mg/dL   LDL Cholesterol 75  0 - 99 mg/dL   Total CHOL/HDL Ratio 3    TSH      Result Value Ref Range   TSH 2.67  0.35 - 4.50 uIU/mL    Patient Active Problem List   Diagnosis Date Noted  . Encounter for Medicare annual wellness exam 07/08/2013  . Rib pain on left side 07/08/2013  . Lower abdominal pain 07/08/2013  . Osteoporosis, unspecified 05/24/2013  . Dysphagia 03/03/2013  . Cough 03/03/2013  . GERD (gastroesophageal reflux disease) 03/03/2013  . Rectal pain 03/03/2013  . 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 Family history of coronary artery disease 10/15/2010  . Elevated blood pressure 10/15/2010  . Routine general medical examination at a health care facility 06/01/2010  . [place], [place] 06/06/2009  . HYPERLIPIDEMIA 06/03/2007  . Urge urinary incontinence 06/03/2007  . MIGRAINES, HX OF 06/03/2007  . SYMPTOM, PALPITATIONS 11/13/2006   Past Medical History  Diagnosis Date  . Hyperlipidemia   . Obesity   . GERD (gastroesophageal reflux disease)   . PVC (premature ventricular contraction)     first dx by holter in 1980's, echo (6/10) EF 27-78%, normal diastolic fxn, normal size RV and fxn, mild MR, PASP 44mHg  . Chronic right ear pain     normal MRI  . Tubal pregnancy   . Blood transfusion, without reported diagnosis   . Esophageal stricture   . Fibroids     uterine, history of  .  History of uterine prolapse   . VT (ventricular tachycardia)   . NSTEMI (non-ST elevated myocardial infarction)   . Intermittent atrial flutter   . Urge urinary incontinence 06/03/2007     Qualifier: Diagnosis of  By: Marcelino Scot CMA, [place]     Past Surgical History  Procedure Laterality Date  . Ectopic pregnancy surgery  1980  . Partial hysterectomy      abdominal, fibroids, and prolapse (1998) bladder tack  . Bladder neck suspension  1998  . Colonoscopy  05/18/2011    Dr. Silvano Rusk  . Esophagogastroduodenoscopy (egd) with esophageal dilation  04/03/12    Dr. Silvano Rusk   History  Substance Use Topics  . Smoking status: Never Smoker   . Smokeless tobacco: Never Used  . Alcohol Use: Yes     Comment: rarely beer   Family History  Problem Relation Age of Onset  . Sudden death Father 75    "coronary arteriosclerosis" on death certificate  . Heart attack Mother 6  . Transient ischemic attack Mother   . Diabetes type II Mother   . Sudden death Mother     ?cardiac  . Diabetes Mother   . Coronary artery disease Mother   . Arrhythmia Brother   . Colon cancer Brother     40's  . Breast cancer Sister   . Colon polyps Sister     adenomatous  . Other Son     pituitary tumor    Allergies  Allergen Reactions  . Pantoprazole Sodium     Abdominal pain, diarrhea  . Ranitidine     Diarrhea, abdominal pain  . Omeprazole Diarrhea    Abdominal pain also    Current Outpatient Prescriptions on File Prior to Visit  Medication Sig Dispense Refill  . aspirin 81 MG tablet Take 81 mg by mouth daily.        . Calcium Carbonate-Vitamin D 600-400 MG-UNIT per tablet Take by mouth.      . calcium citrate-vitamin D (CITRACAL+D) 315-200 MG-UNIT per tablet Take 1 tablet by mouth daily.       . Cholecalciferol (VITAMIN D-3) 5000 UNITS TABS 3,000 Units. 1 tablet by mouth on Monday,Wednesday and Friday      . Coenzyme Q10 (CO Q 10 PO) Take 400 Units by mouth daily.        . Cranberry 300 MG tablet Take 300 mg by mouth daily.        . CRESTOR 20 MG tablet TAKE ONE TABLET BY MOUTH AT BEDTIME  30 tablet  6  . Grape Seed Extract 100 MG CAPS Take 1 capsule by mouth daily.         . Omega-3 Fatty Acids (FISH OIL TRIPLE STRENGTH) 1400 MG CAPS Take by mouth daily.        . RABEprazole (ACIPHEX) 20 MG tablet Take 1 tablet (20 mg total) by mouth daily.  90 tablet  3  . VITAMIN B COMPLEX-C CAPS Take by mouth.       No current facility-administered medications on file prior to visit.    Review of Systems Review of Systems  Constitutional: Negative for fever, appetite change, fatigue and unexpected weight change.  Eyes: Negative for pain and visual disturbance.  Respiratory: Negative for cough and shortness of breath.   Cardiovascular: Negative for cp or palpitations    Gastrointestinal: Negative for nausea, diarrhea and constipation. pos for occas sharp pain in low abd around her incision Genitourinary: Negative for urgency and frequency.  Skin:  Negative for pallor or rash   MSK pos for rib pain  Neurological: Negative for weakness, light-headedness, numbness and headaches.  Hematological: Negative for adenopathy. Does not bruise/bleed easily.  Psychiatric/Behavioral: Negative for dysphoric mood. The patient is not nervous/anxious.         Objective:   Physical Exam  Constitutional: She appears well-developed and well-nourished. No distress.  obese and well appearing   HENT:  Head: Normocephalic and atraumatic.  Right Ear: External ear normal.  Left Ear: External ear normal.  Mouth/Throat: Oropharynx is clear and moist.  Eyes: Conjunctivae and EOM are normal. Pupils are equal, round, and reactive to light. No scleral icterus.  Neck: Normal range of motion. Neck supple. No JVD present. Carotid bruit is not present. No thyromegaly present.  Cardiovascular: Normal rate, regular rhythm, normal heart sounds and intact distal pulses.  Exam reveals no gallop.   Varicosities noted   Pulmonary/Chest: Effort normal and breath sounds normal. No respiratory distress. She has no wheezes. She exhibits tenderness.  Tender over L lat ribs without crepitus or skin change   Abdominal: Soft. Bowel sounds are normal. She exhibits no distension, no abdominal bruit and no mass. There is tenderness.  Very mild tenderness midline abd over scar  No rebound or guarding   Genitourinary: No breast swelling, tenderness, discharge or bleeding.  Musculoskeletal: Normal range of motion. She exhibits no edema and no tenderness.  No kyphosis  Lymphadenopathy:    She has no cervical adenopathy.  Neurological: She is alert. She has normal reflexes. No cranial nerve deficit. She exhibits normal muscle tone. Coordination normal.  Skin: Skin is warm and dry. No rash noted. No erythema. No pallor.  Psychiatric: She has a normal mood and affect.          Assessment & Plan:   Problem List Items Addressed This Visit     Musculoskeletal and Integument   Osteoporosis, unspecified - Primary     Disc tx options Given handouts on evista and fosmax (app due to gerd) Px for evista to try after disc side eff  No fx  Disc need for calcium/ vitamin D/ wt bearing exercise and bone density test every 2 y to monitor Disc safety/ fracture risk in detail        Relevant Medications      EVISTA 60 MG PO TABS     Other   HYPERLIPIDEMIA     Disc goals for lipids and reasons to control them Rev labs with pt Rev low sat fat diet in detail Statin and diet control    Encounter for Medicare annual wellness exam     Reviewed health habits including diet and exercise and skin cancer prevention Reviewed appropriate screening tests for age  Also reviewed health mt list, fam hx and immunization status , as well as social and family history   See HPI Lab rev    Rib pain on left side     Rib xr today ? If rel to old injury or if rib is out of place -disc osteopathic adj Adv heat     Relevant Orders      DG Ribs Unilateral Left (Completed)   Lower abdominal pain     ua today clear  ? If adhesions Consider [place] if not imp in 1-2 wk-pt will update No assoc sympt    Relevant Orders       POCT urinalysis dipstick (Completed)

## 2013-07-09 NOTE — Assessment & Plan Note (Signed)
Rib xr today ? If rel to old injury or if rib is out of place -disc osteopathic adj Adv heat

## 2013-07-09 NOTE — Assessment & Plan Note (Signed)
Disc tx options Given handouts on evista and fosmax (app due to gerd) Px for evista to try after disc side eff  No fx  Disc need for calcium/ vitamin D/ wt bearing exercise and bone density test every 2 y to monitor Disc safety/ fracture risk in detail

## 2013-07-09 NOTE — Assessment & Plan Note (Signed)
Reviewed health habits including diet and exercise and skin cancer prevention Reviewed appropriate screening tests for age  Also reviewed health mt list, fam hx and immunization status , as well as social and family history   See HPI Lab rev

## 2013-07-09 NOTE — Assessment & Plan Note (Signed)
Disc goals for lipids and reasons to control them Rev labs with pt Rev low sat fat diet in detail Statin and diet control

## 2013-07-09 NOTE — Assessment & Plan Note (Signed)
ua today clear  ? If adhesions Consider [place] if not imp in 1-2 wk-pt will update No assoc sympt

## 2013-10-14 ENCOUNTER — Encounter: Payer: Self-pay | Admitting: Internal Medicine

## 2013-10-22 ENCOUNTER — Ambulatory Visit (INDEPENDENT_AMBULATORY_CARE_PROVIDER_SITE_OTHER): Payer: Medicare Other | Admitting: Cardiology

## 2013-10-22 ENCOUNTER — Encounter: Payer: Self-pay | Admitting: Cardiology

## 2013-10-22 VITALS — BP 122/74 | HR 71 | Ht 63.0 in | Wt 191.0 lb

## 2013-10-22 DIAGNOSIS — R002 Palpitations: Secondary | ICD-10-CM | POA: Diagnosis not present

## 2013-10-22 DIAGNOSIS — Z8249 Family history of ischemic heart disease and other diseases of the circulatory system: Secondary | ICD-10-CM

## 2013-10-22 DIAGNOSIS — E785 Hyperlipidemia, unspecified: Secondary | ICD-10-CM

## 2013-10-22 MED ORDER — ROSUVASTATIN CALCIUM 20 MG PO TABS: ORAL_TABLET | ORAL | Status: DC

## 2013-10-22 NOTE — Patient Instructions (Signed)
Your physician wants you to follow-up in: 1 year with Dr Aundra Dubin. (September 2016).  You will receive a reminder letter in the mail two months in advance. If you don't receive a letter, please call our office to schedule the follow-up appointment.

## 2013-10-23 NOTE — Progress Notes (Signed)
Patient ID: Rachel Carlson, female   DOB: 05-08-1945, 68 y.o.   MRN: [id number] PCP: Dr. Glori Bickers  68 yo with hyperlipidemia and family history of sudden cardiac death and CAD returns for followup.   She continues to look after her husband who has PLS. No chest pain or exertional dyspnea though she is not very active.  She continues to have stable palpitations that occur whether or not she drinks caffeine.  She has a history of PVCs.  The palpitations really are not bothering her.  No lightheadedness or syncope. Weight is down 2 lbs.   Labs (9/10):  LDL 58, HDL 57, AST 27, ALT 36 Labs (9/11): LDL 71, HDL 54 Labs (5/12): K 4.4, creatinine 0.7, TSH normal, LDL 88, HDL 56 Labs (5/13): K 4.4, creatinine 0.6, LDL 55, HDL 66 Labs (5/14): LDL 60, HDL 62 Labs (6/15): LDL 75, HDL 56, K 4, creatinine 0.7  ECG: NSR, normal   Allergies (verified):  No Known Drug Allergies  Past Medical History: 1. Hyperlipidemia 2. 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  Holter (10/13) with occasional PVCs 4. Echo (6/10): EF 27-25%, normal diastolic function, normal RV size and function, mild MR, PASP 33 mmHg.  5. GERD with esophageal stricture.  6. Plantar fasciitis  Family History: Father: sudden death age 70- "coronary arteriosclerosis" on death certificate as cause of death.  Mother: CAD, MI in 103's, TIA's before that, sudden death- ? cardiac , DM 2  Siblings: Brother with arrythmia, colon cancer.  Sister has breast cancer.  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   Current Outpatient Prescriptions  Medication Sig Dispense Refill  . aspirin 81 MG tablet Take 81 mg by mouth daily.        . calcium citrate-vitamin D (CITRACAL+D) 315-200 MG-UNIT per tablet Take 1 tablet by mouth  daily.       . Cholecalciferol (VITAMIN D-3) 5000 UNITS TABS 3,000 Units. 1 tablet by mouth on Monday,Wednesday and Friday      . Coenzyme Q10 (CO Q 10 PO) Take 400 Units by mouth daily.        . Cranberry 300 MG tablet Take 300 mg by mouth daily.        . Glucosamine-Chondroitin (GLUCOSAMINE CHONDR COMPLEX PO) Take 1 capsule by mouth daily.      . Grape Seed Extract 100 MG CAPS Take 1 capsule by mouth daily.       . Omega-3 Fatty Acids (FISH OIL TRIPLE STRENGTH) 1400 MG CAPS Take by mouth daily.        . RABEprazole (ACIPHEX) 20 MG tablet Take 1 tablet (20 mg total) by mouth daily.  90 tablet  3  . raloxifene (EVISTA) 60 MG tablet Take 1 tablet (60 mg total) by mouth daily.  30 tablet  11  . rosuvastatin (CRESTOR) 20 MG tablet TAKE ONE TABLET BY MOUTH AT BEDTIME  90 tablet  3  . VITAMIN B COMPLEX-C CAPS Take by mouth.       No current facility-administered medications for this visit.    BP 122/74  Pulse 71  Ht 5' 3"  (1.6 m)  Wt 191 lb (86.637 kg)  BMI 33.84 kg/m2  LMP 05/29/1996 General: NAD, overweight Neck: No JVD, no thyromegaly or thyroid nodule.  Lungs: Clear  to auscultation bilaterally with normal respiratory effort. CV: Nondisplaced PMI.  Heart regular S1/S2, no S3/S4, no murmur.  No peripheral edema.  No carotid bruit.  Normal pedal pulses.  Abdomen: Soft, nontender, no hepatosplenomegaly, no distention.  Neurologic: Alert and oriented x 3.  Psych: Normal affect. Extremities: No clubbing or cyanosis.   Assessment/Plan 1. Family history of coronary artery disease  No chest pain or exertional dyspnea. Lipids are excellent on Crestor. Would continue Crestor and low-dose ASA.  2. Elevated blood pressure BP seems to be reasonably controlled at this time.   3. Palpitations Stable and manageable.  Likely due to PVCs.  Cutting back on caffeine has not affected them.  No changes at this time.  4. Obesity Needs to work on weight loss.  We discussed dietary changes and increasing  activity level.   Rachel Carlson 10/23/2013

## 2013-12-05 ENCOUNTER — Other Ambulatory Visit: Payer: Self-pay | Admitting: Cardiology

## 2014-04-08 ENCOUNTER — Other Ambulatory Visit: Payer: Self-pay

## 2014-04-08 DIAGNOSIS — Z1231 Encounter for screening mammogram for malignant neoplasm of breast: Secondary | ICD-10-CM

## 2014-04-27 DIAGNOSIS — H2513 Age-related nuclear cataract, bilateral: Secondary | ICD-10-CM | POA: Diagnosis not present

## 2014-05-19 DIAGNOSIS — D485 Neoplasm of uncertain behavior of skin: Secondary | ICD-10-CM | POA: Diagnosis not present

## 2014-05-19 DIAGNOSIS — L820 Inflamed seborrheic keratosis: Secondary | ICD-10-CM | POA: Diagnosis not present

## 2014-05-24 ENCOUNTER — Ambulatory Visit
Admission: RE | Admit: 2014-05-24 | Discharge: 2014-05-24 | Disposition: A | Discharge status: Home / Self Care | Payer: Medicare Other | Source: Ambulatory Visit

## 2014-05-24 DIAGNOSIS — Z1231 Encounter for screening mammogram for malignant neoplasm of breast: Secondary | ICD-10-CM | POA: Diagnosis not present

## 2014-05-24 LAB — HM MAMMOGRAPHY: HM Mammogram: NORMAL

## 2014-05-25 ENCOUNTER — Encounter: Payer: Self-pay | Admitting: Family Medicine

## 2014-05-25 ENCOUNTER — Encounter: Payer: Self-pay | Admitting: *Deleted

## 2014-06-30 ENCOUNTER — Other Ambulatory Visit: Payer: Self-pay | Admitting: Internal Medicine

## 2014-07-01 ENCOUNTER — Telehealth: Payer: Self-pay

## 2014-07-01 NOTE — Telephone Encounter (Signed)
Spoke with Freda Munro at [facility] to do a prior authorization for Rabeprazole Sodium 32m tab, Dx: GERD K21.9.  Approved from 04/02/2014-07/01/2015.  Patient has tried dexilant, omeprazole 274m Prilosec 40110m [facility] pharmacy informed.

## 2014-07-03 ENCOUNTER — Telehealth: Payer: Self-pay | Admitting: Family Medicine

## 2014-07-03 DIAGNOSIS — E785 Hyperlipidemia, unspecified: Secondary | ICD-10-CM

## 2014-07-03 DIAGNOSIS — E559 Vitamin D deficiency, unspecified: Secondary | ICD-10-CM

## 2014-07-03 DIAGNOSIS — Z0000 Encounter for general adult medical examination without abnormal findings: Secondary | ICD-10-CM

## 2014-07-03 NOTE — Telephone Encounter (Signed)
-----   Message from Ellamae Sia sent at 06/30/2014  4:28 PM EDT ----- Regarding: Lab orders for Monday, 6.6.16 Patient is scheduled for CPX labs, please order future labs, Thanks , Karna Christmas

## 2014-07-05 ENCOUNTER — Other Ambulatory Visit (INDEPENDENT_AMBULATORY_CARE_PROVIDER_SITE_OTHER): Payer: Medicare Other

## 2014-07-05 DIAGNOSIS — Z0000 Encounter for general adult medical examination without abnormal findings: Secondary | ICD-10-CM

## 2014-07-05 DIAGNOSIS — E785 Hyperlipidemia, unspecified: Secondary | ICD-10-CM

## 2014-07-05 DIAGNOSIS — E559 Vitamin D deficiency, unspecified: Secondary | ICD-10-CM

## 2014-07-05 LAB — VITAMIN D 25 HYDROXY (VIT D DEFICIENCY, FRACTURES): VITD: 35.23 ng/mL (ref 30.00–100.00)

## 2014-07-05 LAB — CBC WITH DIFFERENTIAL/PLATELET
BASOS ABS: 0 10*3/uL (ref 0.0–0.1)
Basophils Relative: 0.4 % (ref 0.0–3.0)
Eosinophils Absolute: 0.2 10*3/uL (ref 0.0–0.7)
Eosinophils Relative: 2.6 % (ref 0.0–5.0)
HCT: 38.9 % (ref 36.0–46.0)
Hemoglobin: 13.2 g/dL (ref 12.0–15.0)
Lymphocytes Relative: 32.5 % (ref 12.0–46.0)
Lymphs Abs: 1.9 10*3/uL (ref 0.7–4.0)
MCHC: 33.9 g/dL (ref 30.0–36.0)
MCV: 89.5 fl (ref 78.0–100.0)
Monocytes Absolute: 0.5 10*3/uL (ref 0.1–1.0)
Monocytes Relative: 8.8 % (ref 3.0–12.0)
Neutro Abs: 3.3 10*3/uL (ref 1.4–7.7)
Neutrophils Relative %: 55.7 % (ref 43.0–77.0)
Platelets: 211 10*3/uL (ref 150.0–400.0)
RBC: 4.34 Mil/uL (ref 3.87–5.11)
RDW: 13.2 % (ref 11.5–15.5)
WBC: 6 10*3/uL (ref 4.0–10.5)

## 2014-07-05 LAB — COMPREHENSIVE METABOLIC PANEL
ALBUMIN: 4.1 g/dL (ref 3.5–5.2)
ALK PHOS: 46 U/L (ref 39–117)
ALT: 22 U/L (ref 0–35)
AST: 20 U/L (ref 0–37)
BILIRUBIN TOTAL: 0.6 mg/dL (ref 0.2–1.2)
BUN: 12 mg/dL (ref 6–23)
CO2: 25 mEq/L (ref 19–32)
CREATININE: 0.64 mg/dL (ref 0.40–1.20)
Calcium: 9.1 mg/dL (ref 8.4–10.5)
Chloride: 106 mEq/L (ref 96–112)
GFR: 98.51 mL/min (ref >60.00)
GLUCOSE: 103 mg/dL — AB (ref 70–99)
POTASSIUM: 4.2 meq/L (ref 3.5–5.1)
Sodium: 136 mEq/L (ref 135–145)
TOTAL PROTEIN: 6.7 g/dL (ref 6.0–8.3)

## 2014-07-05 LAB — LIPID PANEL
CHOL/HDL RATIO: 3
Cholesterol: 160 mg/dL (ref 0–200)
HDL: 54.8 mg/dL (ref >39.00)
LDL Cholesterol: 81 mg/dL (ref 0–99)
NonHDL: 105.2
TRIGLYCERIDES: 120 mg/dL (ref 0.0–149.0)
VLDL: 24 mg/dL (ref 0.0–40.0)

## 2014-07-05 LAB — TSH: TSH: 1.67 u[IU]/mL (ref 0.35–4.50)

## 2014-07-12 ENCOUNTER — Ambulatory Visit (INDEPENDENT_AMBULATORY_CARE_PROVIDER_SITE_OTHER): Payer: Medicare Other | Admitting: Family Medicine

## 2014-07-12 ENCOUNTER — Encounter: Payer: Self-pay | Admitting: Family Medicine

## 2014-07-12 VITALS — BP 110/76 | HR 68 | Temp 98.4°F | Ht 62.75 in | Wt 198.5 lb

## 2014-07-12 DIAGNOSIS — R102 Pelvic and perineal pain: Secondary | ICD-10-CM | POA: Insufficient documentation

## 2014-07-12 DIAGNOSIS — Z1159 Encounter for screening for other viral diseases: Secondary | ICD-10-CM

## 2014-07-12 DIAGNOSIS — Z6836 Body mass index (BMI) 36.0-36.9, adult: Secondary | ICD-10-CM

## 2014-07-12 DIAGNOSIS — E785 Hyperlipidemia, unspecified: Secondary | ICD-10-CM | POA: Diagnosis not present

## 2014-07-12 DIAGNOSIS — E559 Vitamin D deficiency, unspecified: Secondary | ICD-10-CM

## 2014-07-12 DIAGNOSIS — Z0000 Encounter for general adult medical examination without abnormal findings: Secondary | ICD-10-CM

## 2014-07-12 DIAGNOSIS — M810 Age-related osteoporosis without current pathological fracture: Secondary | ICD-10-CM

## 2014-07-12 DIAGNOSIS — E669 Obesity, unspecified: Secondary | ICD-10-CM

## 2014-07-12 MED ORDER — RALOXIFENE HCL 60 MG PO TABS: 60.0000 mg | ORAL_TABLET | Freq: Every day | ORAL | Status: DC

## 2014-07-12 MED ORDER — ROSUVASTATIN CALCIUM 20 MG PO TABS: 20.0000 mg | ORAL_TABLET | Freq: Every day | ORAL | Status: DC

## 2014-07-12 NOTE — Assessment & Plan Note (Signed)
Urged to continue current dose - 5000 iu daily  Level in 30s  Disc imp to bone and overall health

## 2014-07-12 NOTE — Assessment & Plan Note (Signed)
Reviewed health habits including diet and exercise and skin cancer prevention Reviewed appropriate screening tests for age  Also reviewed health mt list, fam hx and immunization status , as well as social and family history   Lab reviewed See HPI Get your prevnar vaccine at the health dept  Lab today for Hepatitis C screening

## 2014-07-12 NOTE — Progress Notes (Signed)
Pre visit review using our clinic review tool, if applicable. No additional management support is needed unless otherwise documented below in the visit note. 

## 2014-07-12 NOTE — Assessment & Plan Note (Signed)
dexa 4/15  On evista and tolerating it Disc need for calcium/ vitamin D/ wt bearing exercise and bone density test every 2 y to monitor Disc safety/ fracture risk in detail   Disc goals for Vit D

## 2014-07-12 NOTE — Assessment & Plan Note (Signed)
Disc goals for lipids and reasons to control them Rev labs with pt Rev low sat fat diet in detail On crestor and diet

## 2014-07-12 NOTE — Patient Instructions (Addendum)
Get your prevnar vaccine at the health dept  Lab today for Hepatitis C screening   We will get back to you about the pelvic issue

## 2014-07-12 NOTE — Assessment & Plan Note (Signed)
Discussed how this problem influences overall health and the risks it imposes  Reviewed plan for weight loss with lower calorie diet (via better food choices and also portion control or program like weight watchers) and exercise building up to or more than 30 minutes 5 days per week including some aerobic activity    

## 2014-07-12 NOTE — Assessment & Plan Note (Signed)
Pt desires screening  Hep C ab today Not high risk

## 2014-07-12 NOTE — Progress Notes (Signed)
Subjective:    Patient ID: Rachel Carlson, female    DOB: Nov 29, 1945, 69 y.o.   MRN: [id number]  HPI Here for annual medicare wellness visit as well as chronic/acute medical problems   I have personally reviewed the Medicare Annual Wellness questionnaire and have noted 1. The patient's medical and social history 2. Their use of alcohol, tobacco or illicit drugs 3. Their current medications and supplements 4. The patient's functional ability including ADL's, fall risks, home safety risks and hearing or visual             impairment. 5. Diet and physical activities 6. Evidence for depression or mood disorders  The patients weight, height, BMI have been recorded in the chart and visual acuity is per eye clinic.  I have made referrals, counseling and provided education to the patient based review of the above and I have provided the pt with a written personalized care plan for preventive services. Reviewed and updated provider list, see scanned forms.  Feeling about the same   See scanned forms.  Routine anticipatory guidance given to patient.  See health maintenance. Colon cancer screening 5/13 - 5 y f/u for family hx (colon cancer brother in his 64s) Breast cancer screening mm 4/16 Self breast exam no lumps  Flu vaccine - did not get this season  Tetanus vaccine 12/07 Pneumovax 1/15 , due for prevnar - will get that at the health dept (will need a px for that) Zoster vaccine 5/11 dexa 4/15 - is taking evista and ca and D , it does give her some hot flashes/ tolerable  No falls or broken bones  Hep C screening (she has had blood transfusions in the past) - desires screening  Advance directive - does not have , given materials to work on  Cognitive function addressed- see scanned forms- and if abnormal then additional documentation follows.  Misplaces things occasionally , otherwise no major concerns   PMH and SH reviewed  Meds, vitals, and allergies reviewed.   ROS: See  HPI.  Otherwise negative.     Wt is up 7 lb with bmi of 35  Diet -not optimal- has to cook for her family  Exercise - housework, but no extra   Cholesterol  Lab Results  Component Value Date   CHOL 160 07/05/2014   CHOL 158 06/29/2013   CHOL 146 06/24/2012   Lab Results  Component Value Date   HDL 54.80 07/05/2014   HDL 55.90 06/29/2013   HDL 61.60 06/24/2012   Lab Results  Component Value Date   LDLCALC 81 07/05/2014   LDLCALC 75 06/29/2013   LDLCALC 60 06/24/2012   Lab Results  Component Value Date   TRIG 120.0 07/05/2014   TRIG 137.0 06/29/2013   TRIG 124.0 06/24/2012   Lab Results  Component Value Date   CHOLHDL 3 07/05/2014   CHOLHDL 3 06/29/2013   CHOLHDL 2 06/24/2012   Lab Results  Component Value Date   LDLDIRECT 257.3 06/04/2008   LDLDIRECT 203.9 06/04/2007   crestor and diet     Chemistry      Component Value Date/Time   NA 136 07/05/2014 0908   K 4.2 07/05/2014 0908   CL 106 07/05/2014 0908   CO2 25 07/05/2014 0908   BUN 12 07/05/2014 0908   CREATININE 0.64 07/05/2014 0908      Component Value Date/Time   CALCIUM 9.1 07/05/2014 0908   ALKPHOS 46 07/05/2014 0908   AST 20 07/05/2014 0908  ALT 22 07/05/2014 0908   BILITOT 0.6 07/05/2014 0908      Lab Results  Component Value Date   WBC 6.0 07/05/2014   HGB 13.2 07/05/2014   HCT 38.9 07/05/2014   MCV 89.5 07/05/2014   PLT 211.0 07/05/2014   Lab Results  Component Value Date   TSH 1.67 07/05/2014    D level 35  Increased her vit D3   From 3000-5000 iu daily  Also ca plus D   Is having continuing low abd /pelvic pain -esp after physical work  Still has ovaries    Patient Active Problem List   Diagnosis Date Noted  . Need for hepatitis C screening test 07/12/2014  . Obesity 07/12/2014  . Pelvic pain in female 07/12/2014  . Vitamin D deficiency 07/03/2014  . Encounter for Medicare annual wellness exam 07/08/2013  . Rib pain on left side 07/08/2013  . Lower abdominal pain  07/08/2013  . Osteoporosis 05/24/2013  . Dysphagia 03/03/2013  . Cough 03/03/2013  . GERD (gastroesophageal reflux disease) 03/03/2013  . Rectal pain 03/03/2013  . 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 Family history of coronary artery disease 10/15/2010  . Elevated blood pressure 10/15/2010  . Routine general medical examination at a health care facility 06/01/2010  . [place] 06/06/2009  . Hyperlipidemia 06/03/2007  . Urge urinary incontinence 06/03/2007  . MIGRAINES, HX OF 06/03/2007  . SYMPTOM, PALPITATIONS 11/13/2006   Past Medical History  Diagnosis Date  . Hyperlipidemia   . Obesity   . GERD (gastroesophageal reflux disease)   . PVC (premature ventricular contraction)     first dx by holter in 1980's, echo (6/10) EF 58-59%, normal diastolic fxn, normal size RV and fxn, mild MR, PASP 31mHg  . Chronic right ear pain     normal MRI  . Tubal pregnancy   . Blood transfusion, without reported diagnosis   . Esophageal stricture   . Fibroids     uterine, history of  . History of uterine prolapse   . VT (ventricular tachycardia)   . NSTEMI (non-ST elevated myocardial infarction)   . Intermittent atrial flutter   . Urge urinary incontinence 06/03/2007    Qualifier: Diagnosis of  By: BMarcelino ScotCMA, AAuburn Bilberry    Past Surgical History  Procedure Laterality Date  . Ectopic pregnancy surgery  1980  . Partial hysterectomy      abdominal, fibroids, and prolapse (1998) bladder tack  . Bladder neck suspension  1998  . Colonoscopy  05/18/2011    Dr. CSilvano Rusk . Esophagogastroduodenoscopy (egd) with esophageal dilation  04/03/12    Dr. CSilvano Rusk  History  Substance Use Topics  . Smoking status: Never Smoker   . Smokeless tobacco: Never Used  . Alcohol Use: 0.0 oz/week    0 Standard drinks or equivalent per week     Comment: rarely beer   Family History  Problem Relation Age of Onset  . Sudden death Father 450   "coronary arteriosclerosis" on death  certificate  . Heart attack Mother 734 . Transient ischemic attack Mother   . Diabetes type II Mother   . Sudden death Mother     ?cardiac  . Diabetes Mother   . Coronary artery disease Mother   . Arrhythmia Brother   . Colon cancer Brother     40's  . Breast cancer Sister   . Colon polyps Sister     adenomatous  . Other Son  pituitary tumor    Allergies  Allergen Reactions  . Pantoprazole Sodium     Abdominal pain, diarrhea  . Ranitidine     Diarrhea, abdominal pain  . Omeprazole Diarrhea    Abdominal pain also    Current Outpatient Prescriptions on File Prior to Visit  Medication Sig Dispense Refill  . aspirin 81 MG tablet Take 81 mg by mouth daily.      . calcium citrate-vitamin D (CITRACAL+D) 315-200 MG-UNIT per tablet Take 1 tablet by mouth 2 (two) times daily.     . Cholecalciferol (VITAMIN D-3) 5000 UNITS TABS 3,000 Units. 1 tablet by mouth on Monday,Wednesday and Friday    . Coenzyme Q10 (CO Q 10 PO) Take 400 Units by mouth daily.      . Cranberry 300 MG tablet Take 300 mg by mouth daily.      . Glucosamine-Chondroitin (GLUCOSAMINE CHONDR COMPLEX PO) Take 2 capsules by mouth daily.     . Grape Seed Extract 100 MG CAPS Take 1 capsule by mouth daily.     . Omega-3 Fatty Acids (FISH OIL TRIPLE STRENGTH) 1400 MG CAPS Take by mouth daily.      . RABEprazole (ACIPHEX) 20 MG tablet TAKE ONE TABLET BY MOUTH ONCE DAILY 90 tablet 0  . VITAMIN B COMPLEX-C CAPS Take 1 capsule by mouth daily.      No current facility-administered medications on file prior to visit.    Review of Systems Review of Systems  Constitutional: Negative for fever, appetite change, fatigue and unexpected weight change.  Eyes: Negative for pain and visual disturbance.  Respiratory: Negative for cough and shortness of breath.   Cardiovascular: Negative for cp or palpitations    Gastrointestinal: Negative for nausea, diarrhea and constipation.  Genitourinary: Negative for urgency and frequency. neg  for dysuria or hematuria, pos for R sided pelvic pain this week (aching) Skin: Negative for pallor or rash   Neurological: Negative for weakness, light-headedness, numbness and headaches.  Hematological: Negative for adenopathy. Does not bruise/bleed easily.  Psychiatric/Behavioral: Negative for dysphoric mood. The patient is not nervous/anxious.         Objective:   Physical Exam  Constitutional: She appears well-developed and well-nourished. No distress.  obese and well appearing   HENT:  Head: Normocephalic and atraumatic.  Right Ear: External ear normal.  Left Ear: External ear normal.  Mouth/Throat: Oropharynx is clear and moist.  Eyes: Conjunctivae and EOM are normal. Pupils are equal, round, and reactive to light. No scleral icterus.  Neck: Normal range of motion. Neck supple. No JVD present. Carotid bruit is not present. No thyromegaly present.  Cardiovascular: Normal rate, regular rhythm, normal heart sounds and intact distal pulses.  Exam reveals no gallop.   Pulmonary/Chest: Effort normal and breath sounds normal. No respiratory distress. She has no wheezes. She exhibits no tenderness.  Abdominal: Soft. Bowel sounds are normal. She exhibits no distension, no abdominal bruit and no mass. There is no tenderness.  No suprapubic tenderness or fullness    Genitourinary: No breast swelling, tenderness, discharge or bleeding.  Breast exam: No mass, nodules, thickening, tenderness, bulging, retraction, inflamation, nipple discharge or skin changes noted.  No axillary or clavicular LA.      Musculoskeletal: Normal range of motion. She exhibits no edema or tenderness.  No kyphosis   Lymphadenopathy:    She has no cervical adenopathy.  Neurological: She is alert. She has normal reflexes. No cranial nerve deficit. She exhibits normal muscle tone. Coordination normal.  Skin:  Skin is warm and dry. No rash noted. No erythema. No pallor.  Psychiatric: She has a normal mood and affect.           Assessment & Plan:   Problem List Items Addressed This Visit    Encounter for Medicare annual wellness exam - Primary    Reviewed health habits including diet and exercise and skin cancer prevention Reviewed appropriate screening tests for age  Also reviewed health mt list, fam hx and immunization status , as well as social and family history   Lab reviewed See HPI Get your prevnar vaccine at the health dept  Lab today for Hepatitis C screening       Hyperlipidemia    Disc goals for lipids and reasons to control them Rev labs with pt Rev low sat fat diet in detail On crestor and diet        Relevant Medications   rosuvastatin (CRESTOR) 20 MG tablet   Need for hepatitis C screening test    Pt desires screening  Hep C ab today Not high risk       Relevant Orders   Hepatitis C Antibody   Obesity    Discussed how this problem influences overall health and the risks it imposes  Reviewed plan for weight loss with lower calorie diet (via better food choices and also portion control or program like weight watchers) and exercise building up to or more than 30 minutes 5 days per week including some aerobic activity         Osteoporosis    dexa 4/15  On evista and tolerating it Disc need for calcium/ vitamin D/ wt bearing exercise and bone density test every 2 y to monitor Disc safety/ fracture risk in detail   Disc goals for Vit D      Relevant Medications   raloxifene (EVISTA) 60 MG tablet   Vitamin D deficiency    Urged to continue current dose - 5000 iu daily  Level in 30s  Disc imp to bone and overall health

## 2014-07-13 LAB — HEPATITIS C ANTIBODY: HCV Ab: NEGATIVE

## 2014-07-16 ENCOUNTER — Telehealth: Payer: Self-pay | Admitting: Family Medicine

## 2014-07-16 DIAGNOSIS — R102 Pelvic and perineal pain: Secondary | ICD-10-CM

## 2014-07-16 NOTE — Telephone Encounter (Signed)
Pelvic US order for pelvic pain  Will route to [facility]

## 2014-07-21 ENCOUNTER — Ambulatory Visit
Admission: RE | Admit: 2014-07-21 | Discharge: 2014-07-21 | Disposition: A | Discharge status: Home / Self Care | Payer: Medicare Other | Source: Ambulatory Visit | Attending: Family Medicine | Admitting: Family Medicine

## 2014-07-21 DIAGNOSIS — R102 Pelvic and perineal pain: Secondary | ICD-10-CM

## 2014-07-24 ENCOUNTER — Telehealth: Payer: Self-pay | Admitting: Family Medicine

## 2014-07-24 DIAGNOSIS — M25559 Pain in unspecified hip: Secondary | ICD-10-CM | POA: Insufficient documentation

## 2014-07-24 NOTE — Telephone Encounter (Signed)
Order done

## 2014-07-24 NOTE — Telephone Encounter (Signed)
-----   Message from Tammi Sou, [place] sent at 07/23/2014  4:45 PM EDT ----- Pt notified of Dr. Marliss Coots recommendations. Pt said she will come in Monday 07/26/14 for an xray, please put order in

## 2014-07-27 ENCOUNTER — Other Ambulatory Visit: Payer: Self-pay | Admitting: Family Medicine

## 2014-07-27 ENCOUNTER — Ambulatory Visit (INDEPENDENT_AMBULATORY_CARE_PROVIDER_SITE_OTHER)
Admission: RE | Admit: 2014-07-27 | Discharge: 2014-07-27 | Disposition: A | Discharge status: Home / Self Care | Payer: Medicare Other | Source: Ambulatory Visit | Attending: Family Medicine | Admitting: Family Medicine

## 2014-07-27 DIAGNOSIS — M25559 Pain in unspecified hip: Secondary | ICD-10-CM

## 2014-07-27 DIAGNOSIS — M160 Bilateral primary osteoarthritis of hip: Secondary | ICD-10-CM | POA: Diagnosis not present

## 2014-07-28 ENCOUNTER — Telehealth: Payer: Self-pay | Admitting: Family Medicine

## 2014-07-28 DIAGNOSIS — M25559 Pain in unspecified hip: Secondary | ICD-10-CM

## 2014-07-28 NOTE — Telephone Encounter (Signed)
-----   Message from Tammi Sou, [place] sent at 07/28/2014  9:31 AM EDT ----- Pt notified of xray results and Dr. Marliss Coots comments. Pt agrees with referral to ortho I advise her our [facility] will call to schedule appt

## 2014-07-28 NOTE — Telephone Encounter (Signed)
Ref done  

## 2014-08-10 ENCOUNTER — Ambulatory Visit (INDEPENDENT_AMBULATORY_CARE_PROVIDER_SITE_OTHER): Payer: Medicare Other | Admitting: Family Medicine

## 2014-08-10 ENCOUNTER — Encounter: Payer: Self-pay | Admitting: Family Medicine

## 2014-08-10 VITALS — BP 126/78 | HR 65 | Temp 98.8°F | Ht 62.75 in | Wt 197.0 lb

## 2014-08-10 DIAGNOSIS — K921 Melena: Secondary | ICD-10-CM | POA: Diagnosis not present

## 2014-08-10 DIAGNOSIS — R197 Diarrhea, unspecified: Secondary | ICD-10-CM

## 2014-08-10 DIAGNOSIS — R1031 Right lower quadrant pain: Secondary | ICD-10-CM | POA: Insufficient documentation

## 2014-08-10 NOTE — Progress Notes (Signed)
Pre visit review using our clinic review tool, if applicable. No additional management support is needed unless otherwise documented below in the visit note. 

## 2014-08-10 NOTE — Patient Instructions (Signed)
Stool tests (c diff and culture)  Stop at check out for referral for CT of abdomen Keep your GI appt   If symptoms worsen please let me know

## 2014-08-10 NOTE — Progress Notes (Signed)
Subjective:    Patient ID: Rachel Carlson, female    DOB: 02-Sep-1945, 70 y.o.   MRN: [id number]  HPI Pt here with GI issues   Her BMS are more frequent than usual  As many as 16 BM in a day  Not diarrhea however Has gas  Mucous that is pink tinged and occ BRB If she eats - she has urgency for BMs also (day time)  She tried pepto bismol and it did not help   No hx of hemorrhoids that she knows of    If she eats at night -is ok   Has not been on abx lately  Started the day after her ultrasound  Had a lot of pain and cramps and nausea after her ultrasound on the R   R sided low abd/pelvic area pain continues   Called to get an appt with Dr Carlean Purl - and could not get in  Got an appt end of august   (had appt on 7/29 but could not go)    Dg Pelvis 1-2 Views  07/27/2014   CLINICAL DATA:  Groin pain, worse following activity  EXAM: PELVIS - 1-2 VIEW  COMPARISON:  None.  FINDINGS: The bony pelvis is adequately mineralized. The sacrum and SI joints are normal for age. There is scleroses of the facet joints at L4-5 and L5-S1 on the right. The hip joint spaces are mildly narrowed bilaterally. The femoral heads, necks, and intertrochanteric regions are unremarkable.  IMPRESSION: There is mild symmetric narrowing of the hip joint spaces greater on the right than on the left. There is no acute bony abnormality of the pelvis. There is sclerosis of the facet joints at L4-5 and L5-S1 predominantly on the right.   Electronically Signed   By: David  Martinique M.D.   On: 07/27/2014 12:02   US Transvaginal Non-ob  07/21/2014   CLINICAL DATA:  Pelvic pain in female, postmenopausal  EXAM: TRANSABDOMINAL AND TRANSVAGINAL ULTRASOUND OF PELVIS  TECHNIQUE: Both transabdominal and transvaginal ultrasound examinations of the pelvis were performed. Transabdominal technique was performed for global imaging of the pelvis including uterus, ovaries, adnexal regions, and pelvic cul-de-sac. It was necessary to  proceed with endovaginal exam following the transabdominal exam to visualize the ovaries.  COMPARISON:  None  FINDINGS: Uterus  Surgically absent  Endometrium  N/A  Right ovary  Measurements: 2.1 x 1.3 x 1.3 cm. Normal morphology without mass.  Left ovary  Measurements: 2.6 x 1.5 x 1.6 cm. Normal morphology without mass.  Other findings  No free pelvic fluid or adnexal masses.  IMPRESSION: Post hysterectomy.  Sonographically normal appearing ovaries.  No acute abnormalities.   Electronically Signed   By: Lavonia Dana M.D.   On: 07/21/2014 15:21   US Pelvis Complete  07/21/2014   CLINICAL DATA:  Pelvic pain in female, postmenopausal  EXAM: TRANSABDOMINAL AND TRANSVAGINAL ULTRASOUND OF PELVIS  TECHNIQUE: Both transabdominal and transvaginal ultrasound examinations of the pelvis were performed. Transabdominal technique was performed for global imaging of the pelvis including uterus, ovaries, adnexal regions, and pelvic cul-de-sac. It was necessary to proceed with endovaginal exam following the transabdominal exam to visualize the ovaries.  COMPARISON:  None  FINDINGS: Uterus  Surgically absent  Endometrium  N/A  Right ovary  Measurements: 2.1 x 1.3 x 1.3 cm. Normal morphology without mass.  Left ovary  Measurements: 2.6 x 1.5 x 1.6 cm. Normal morphology without mass.  Other findings  No free pelvic fluid or adnexal masses.  IMPRESSION:  Post hysterectomy.  Sonographically normal appearing ovaries.  No acute abnormalities.   Electronically Signed   By: Lavonia Dana M.D.   On: 07/21/2014 15:21     Patient Active Problem List   Diagnosis Date Noted  . Pain in joint, pelvic region and thigh 07/24/2014  . Need for hepatitis C screening test 07/12/2014  . Obesity 07/12/2014  . Pelvic pain in female 07/12/2014  . Vitamin D deficiency 07/03/2014  . Encounter for Medicare annual wellness exam 07/08/2013  . Rib pain on left side 07/08/2013  . Lower abdominal pain 07/08/2013  . Osteoporosis 05/24/2013  . Dysphagia  03/03/2013  . Cough 03/03/2013  . GERD (gastroesophageal reflux disease) 03/03/2013  . Rectal pain 03/03/2013  . 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 Family history of coronary artery disease 10/15/2010  . Elevated blood pressure 10/15/2010  . Routine general medical examination at a health care facility 06/01/2010  . [place], [place] 06/06/2009  . Hyperlipidemia 06/03/2007  . Urge urinary incontinence 06/03/2007  . MIGRAINES, HX OF 06/03/2007  . SYMPTOM, PALPITATIONS 11/13/2006   Past Medical History  Diagnosis Date  . Hyperlipidemia   . Obesity   . GERD (gastroesophageal reflux disease)   . PVC (premature ventricular contraction)     first dx by holter in 1980's, echo (6/10) EF 42-70%, normal diastolic fxn, normal size RV and fxn, mild MR, PASP 70mHg  . Chronic right ear pain     normal MRI  . Tubal pregnancy   . Blood transfusion, without reported diagnosis   . Esophageal stricture   . Fibroids     uterine, history of  . History of uterine prolapse   . VT (ventricular tachycardia)   . NSTEMI (non-ST elevated myocardial infarction)   . Intermittent atrial flutter   . Urge urinary incontinence 06/03/2007    Qualifier: Diagnosis of  By: BMarcelino ScotCMA, AAuburn Bilberry    Past Surgical History  Procedure Laterality Date  . Ectopic pregnancy surgery  1980  . Partial hysterectomy      abdominal, fibroids, and prolapse (1998) bladder tack  . Bladder neck suspension  1998  . Colonoscopy  05/18/2011    Dr. CSilvano Rusk . Esophagogastroduodenoscopy (egd) with esophageal dilation  04/03/12    Dr. CSilvano Rusk  History  Substance Use Topics  . Smoking status: Never Smoker   . Smokeless tobacco: Never Used  . Alcohol Use: 0.0 oz/week    0 Standard drinks or equivalent per week     Comment: rarely beer   Family History  Problem Relation Age of Onset  . Sudden death Father 454   "coronary arteriosclerosis" on death certificate  . Heart attack Mother 715 . Transient  ischemic attack Mother   . Diabetes type II Mother   . Sudden death Mother     ?cardiac  . Diabetes Mother   . Coronary artery disease Mother   . Arrhythmia Brother   . Colon cancer Brother     40's  . Breast cancer Sister   . Colon polyps Sister     adenomatous  . Other Son     pituitary tumor    Allergies  Allergen Reactions  . Pantoprazole Sodium     Abdominal pain, diarrhea  . Ranitidine     Diarrhea, abdominal pain  . Omeprazole Diarrhea    Abdominal pain also    Current Outpatient Prescriptions on File Prior to Visit  Medication Sig Dispense Refill  .  aspirin 81 MG tablet Take 81 mg by mouth daily.      . calcium citrate-vitamin D (CITRACAL+D) 315-200 MG-UNIT per tablet Take 1 tablet by mouth 2 (two) times daily.     . Cholecalciferol (VITAMIN D-3) 5000 UNITS TABS 3,000 Units. 1 tablet by mouth on Monday,Wednesday and Friday    . Coenzyme Q10 (CO Q 10 PO) Take 400 Units by mouth daily.      . Cranberry 300 MG tablet Take 300 mg by mouth daily.      . Glucosamine-Chondroitin (GLUCOSAMINE CHONDR COMPLEX PO) Take 2 capsules by mouth daily.     . Grape Seed Extract 100 MG CAPS Take 1 capsule by mouth daily.     . Omega-3 Fatty Acids (FISH OIL TRIPLE STRENGTH) 1400 MG CAPS Take by mouth daily.      . RABEprazole (ACIPHEX) 20 MG tablet TAKE ONE TABLET BY MOUTH ONCE DAILY 90 tablet 0  . raloxifene (EVISTA) 60 MG tablet Take 1 tablet (60 mg total) by mouth daily. 30 tablet 11  . rosuvastatin (CRESTOR) 20 MG tablet Take 1 tablet (20 mg total) by mouth at bedtime. 30 tablet 11  . VITAMIN B COMPLEX-C CAPS Take 1 capsule by mouth daily.      No current facility-administered medications on file prior to visit.    Review of Systems Review of Systems  Constitutional: Negative for fever, appetite change, fatigue and unexpected weight change.  Eyes: Negative for pain and visual disturbance.  Respiratory: Negative for cough and shortness of breath.   Cardiovascular: Negative for cp  or palpitations    Gastrointestinal: Negative for nausea, vomiting, constipation, or dark stool, pos for brb in stools with mucous and RLQ pain  Genitourinary: Negative for urgency and frequency. neg for dysuria  Skin: Negative for pallor or rash   Neurological: Negative for weakness, light-headedness, numbness and headaches.  Hematological: Negative for adenopathy. Does not bruise/bleed easily.  Psychiatric/Behavioral: Negative for dysphoric mood. The patient is not nervous/anxious.         Objective:   Physical Exam  Constitutional: She appears well-developed and well-nourished. No distress.  obese and well appearing   HENT:  Head: Normocephalic and atraumatic.  Mouth/Throat: Oropharynx is clear and moist.  Eyes: Conjunctivae and EOM are normal. Pupils are equal, round, and reactive to light. No scleral icterus.  Neck: Normal range of motion. Neck supple. No JVD present. No thyromegaly present.  Cardiovascular: Normal rate and regular rhythm.   Pulmonary/Chest: Effort normal and breath sounds normal. No respiratory distress. She has no wheezes. She has no rales.  Abdominal: Soft. Bowel sounds are normal. She exhibits no abdominal bruit, no pulsatile midline mass and no mass. There is no hepatosplenomegaly. There is tenderness in the right lower quadrant. There is no rigidity, no rebound, no guarding, no CVA tenderness, no tenderness at McBurney's point and negative Murphy's sign. No hernia.  Tender in RLQ   Musculoskeletal: She exhibits no edema.  Lymphadenopathy:    She has no cervical adenopathy.  Neurological: She is alert.  Skin: Skin is warm and dry. No rash noted. No erythema. No pallor.  Psychiatric: She has a normal mood and affect.  Nursing note and vitals reviewed.         Assessment & Plan:   Problem List Items Addressed This Visit    Blood in stool   Relevant Orders   CT Abdomen Pelvis W Contrast   Diarrhea    Frequent stools with mucous and BRB See  assessment for RLQ pain - wide differential  Stool tests and CT ordered  Rev last labs and colnoscopy      Relevant Orders   C. difficile GDH and Toxin A/B   Stool culture   CT Abdomen Pelvis W Contrast   RLQ abdominal pain - Primary    Differential is wide incl diverticulosis/itis or other colitis/ appendicitis/c diff / IBS  With frequent mucousy stools Rev recent labs and most recent colonoscopy- has tics  Stool test for c diff and cx  CT of abd ordered (will be traveling soon-want to get this done) Has GI appt for aug   If symptoms worsen will update       Relevant Orders   C. difficile GDH and Toxin A/B   Stool culture   CT Abdomen Pelvis W Contrast

## 2014-08-11 NOTE — Assessment & Plan Note (Signed)
Differential is wide incl diverticulosis/itis or other colitis/ appendicitis/c diff / IBS  With frequent mucousy stools Rev recent labs and most recent colonoscopy- has tics  Stool test for c diff and cx  CT of abd ordered (will be traveling soon-want to get this done) Has GI appt for aug   If symptoms worsen will update

## 2014-08-11 NOTE — Assessment & Plan Note (Signed)
Frequent stools with mucous and BRB See assessment for RLQ pain - wide differential  Stool tests and CT ordered  Rev last labs and colnoscopy

## 2014-08-12 ENCOUNTER — Ambulatory Visit (INDEPENDENT_AMBULATORY_CARE_PROVIDER_SITE_OTHER)
Admission: RE | Admit: 2014-08-12 | Discharge: 2014-08-12 | Disposition: A | Discharge status: Home / Self Care | Payer: Medicare Other | Source: Ambulatory Visit | Attending: Family Medicine | Admitting: Family Medicine

## 2014-08-12 DIAGNOSIS — R1031 Right lower quadrant pain: Secondary | ICD-10-CM

## 2014-08-12 DIAGNOSIS — R197 Diarrhea, unspecified: Secondary | ICD-10-CM | POA: Diagnosis not present

## 2014-08-12 DIAGNOSIS — K921 Melena: Secondary | ICD-10-CM

## 2014-08-12 DIAGNOSIS — K5730 Diverticulosis of large intestine without perforation or abscess without bleeding: Secondary | ICD-10-CM | POA: Diagnosis not present

## 2014-08-12 MED ORDER — IOHEXOL 300 MG/ML  SOLN
100.0000 mL | Freq: Once | INTRAMUSCULAR | Status: AC | PRN
  Administered 2014-08-12: 100 mL via INTRAVENOUS

## 2014-08-12 NOTE — Addendum Note (Signed)
Addended by: Ellamae Sia on: 08/12/2014 02:31 PM   Modules accepted: Orders

## 2014-08-13 LAB — C. DIFFICILE GDH AND TOXIN A/B
C. DIFF TOXIN A/B: NOT DETECTED
C. difficile GDH: NOT DETECTED

## 2014-08-16 LAB — STOOL CULTURE

## 2014-08-18 DIAGNOSIS — M25552 Pain in left hip: Secondary | ICD-10-CM | POA: Diagnosis not present

## 2014-08-18 DIAGNOSIS — M25551 Pain in right hip: Secondary | ICD-10-CM | POA: Diagnosis not present

## 2014-08-27 ENCOUNTER — Ambulatory Visit: Payer: Medicare Other | Admitting: Internal Medicine

## 2014-09-01 ENCOUNTER — Other Ambulatory Visit: Payer: Self-pay | Admitting: Cardiology

## 2014-09-13 ENCOUNTER — Other Ambulatory Visit: Payer: Self-pay

## 2014-09-13 MED ORDER — ROSUVASTATIN CALCIUM 20 MG PO TABS: 20.0000 mg | ORAL_TABLET | Freq: Every day | ORAL | Status: DC

## 2014-09-29 ENCOUNTER — Encounter: Payer: Self-pay | Admitting: Internal Medicine

## 2014-09-29 ENCOUNTER — Ambulatory Visit (INDEPENDENT_AMBULATORY_CARE_PROVIDER_SITE_OTHER): Payer: Medicare Other | Admitting: Internal Medicine

## 2014-09-29 VITALS — BP 130/80 | HR 80 | Ht 62.5 in | Wt 196.5 lb

## 2014-09-29 DIAGNOSIS — K6289 Other specified diseases of anus and rectum: Secondary | ICD-10-CM

## 2014-09-29 DIAGNOSIS — K529 Noninfective gastroenteritis and colitis, unspecified: Secondary | ICD-10-CM | POA: Diagnosis not present

## 2014-09-29 DIAGNOSIS — K222 Esophageal obstruction: Secondary | ICD-10-CM | POA: Diagnosis not present

## 2014-09-29 DIAGNOSIS — K625 Hemorrhage of anus and rectum: Secondary | ICD-10-CM

## 2014-09-29 MED ORDER — RABEPRAZOLE SODIUM 20 MG PO TBEC: 20.0000 mg | DELAYED_RELEASE_TABLET | Freq: Every day | ORAL | Status: DC

## 2014-09-29 MED ORDER — HYDROCORTISONE 2.5 % RE CREA: 1.0000 "application " | TOPICAL_CREAM | Freq: Two times a day (BID) | RECTAL | Status: DC

## 2014-09-29 NOTE — Progress Notes (Signed)
   Subjective:    Patient ID: Rachel Carlson, female    DOB: Jun 28, 1945, 69 y.o.   MRN: [id number] Cc: frequent stools, rectal bleeding, dysphagia HPI  Over past several month - Having numerous formed stools/day, tenesmus, passing bloody mucous. Has had some abd pain. + intermittent urgency and some incontinence.CT abd/pelvis negative. Not having watery stools. Has had neg stoll cx c diff etc No OTC medications tried.   Some dysphagia also - "not that bad", "not ready for another dilation. Hx egd w/ stricture dilation in 2013, no sxs for yrs after that. Neg screening colonoscopy 2013  Going to [place] this w/e - husband has progressive neuro d/o and wants to go - she is concerned about going there with current bowel issues but plans to go.  Medications, allergies, past medical history, past surgical history, family history and social history are reviewed and updated in the EMR.   Review of Systems As above    Objective:   Physical Exam @BP  130/80 mmHg  Pulse 80  Ht 5' 2.5" (1.588 m)  Wt 196 lb 8 oz (89.132 kg)  BMI 35.35 kg/m2  LMP 05/29/1996@  General:  NAD Eyes:  anicteric Abdomen:  soft and nontender, BS+  Female staff present Rectal: NL anoderm, non-tender - formed stool, brown, no mass   Anoscopy was performed with the patient in the left lateral decubitus position while a chaperone was present and revealed boggy erythematous rectal mucosa and mucoid exudate c/w proctitis. 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 Will start hydrocortisone cream and she will have colonoscopy to evaluate this and also given FHx CRCA will do full colonoscopy now rather than sigmoidoscopy. The  risks and benefits as well as alternatives of endoscopic procedure(s) have been discussed and reviewed. All questions answered. The patient agrees to proceed.   Refill rabeprazole for GERD + esophageal stricture. She will let me know when she is ready to have another esophageal dilation. ? Needs higher dose of PPI but has had loose stools/diarrhea with many PPI's and wary of changing anything.  I appreciate the opportunity to care for this patient.  GB:KORJG Tower, MD

## 2014-09-29 NOTE — Patient Instructions (Addendum)
  Today you have been given a printed rx for your generic aciphex and hydrocortisone rectal cream.  You have been scheduled for a colonoscopy. Please follow written instructions given to you at your visit today.  Please pick up your over the counter prep supplies at the pharmacy. If you use inhalers (even only as needed), please bring them with you on the day of your procedure.   I appreciate the opportunity to care for you. Silvano Rusk, MD, [facility]

## 2014-10-12 ENCOUNTER — Encounter: Payer: Self-pay | Admitting: Internal Medicine

## 2014-10-12 ENCOUNTER — Ambulatory Visit (AMBULATORY_SURGERY_CENTER): Payer: Medicare Other | Admitting: Internal Medicine

## 2014-10-12 VITALS — BP 124/72 | HR 68 | Temp 98.2°F | Resp 15 | Ht 62.5 in | Wt 196.0 lb

## 2014-10-12 DIAGNOSIS — K529 Noninfective gastroenteritis and colitis, unspecified: Secondary | ICD-10-CM | POA: Diagnosis not present

## 2014-10-12 DIAGNOSIS — D123 Benign neoplasm of transverse colon: Secondary | ICD-10-CM | POA: Diagnosis not present

## 2014-10-12 DIAGNOSIS — R194 Change in bowel habit: Secondary | ICD-10-CM | POA: Diagnosis not present

## 2014-10-12 DIAGNOSIS — K51311 Ulcerative (chronic) rectosigmoiditis with rectal bleeding: Secondary | ICD-10-CM | POA: Diagnosis not present

## 2014-10-12 DIAGNOSIS — K219 Gastro-esophageal reflux disease without esophagitis: Secondary | ICD-10-CM | POA: Diagnosis not present

## 2014-10-12 DIAGNOSIS — I4892 Unspecified atrial flutter: Secondary | ICD-10-CM | POA: Diagnosis not present

## 2014-10-12 DIAGNOSIS — K6289 Other specified diseases of anus and rectum: Secondary | ICD-10-CM | POA: Diagnosis not present

## 2014-10-12 DIAGNOSIS — K5130 Ulcerative (chronic) rectosigmoiditis without complications: Secondary | ICD-10-CM | POA: Insufficient documentation

## 2014-10-12 MED ORDER — MESALAMINE 1.2 G PO TBEC: 2.4000 g | DELAYED_RELEASE_TABLET | Freq: Two times a day (BID) | ORAL | Status: DC

## 2014-10-12 MED ORDER — SODIUM CHLORIDE 0.9 % IV SOLN: 500.0000 mL | INTRAVENOUS | Status: DC

## 2014-10-12 MED ORDER — HYDROCORTISONE 100 MG/60ML RE ENEM: 1.0000 | ENEMA | Freq: Every day | RECTAL | Status: DC

## 2014-10-12 NOTE — Op Note (Signed)
[facility]  [address] [place], [id number]   COLONOSCOPY PROCEDURE REPORT  PATIENT: Rachel Carlson, Rachel Carlson  MR#: [id number] BIRTHDATE: 1945/06/18 , 54  yrs. old GENDER: female ENDOSCOPIST: Gatha Mayer, MD, [facility] PROCEDURE DATE:  10/12/2014 PROCEDURE:   Colonoscopy, diagnostic and Colonoscopy with biopsy History of Adenoma - Now for follow-up colonoscopy & has been > or = to 3 yrs.  N/A  Polyps removed today? Yes ASA CLASS:   Class II INDICATIONS:Clinically significant diarrhea of unexplained origin and Patient is not applicable for Colorectal Neoplasm Risk Assessment for this procedure. MEDICATIONS: Propofol 200 mg IV and Monitored anesthesia care  DESCRIPTION OF PROCEDURE:   After the risks benefits and alternatives of the procedure were thoroughly explained, informed consent was obtained.  The digital rectal exam revealed no abnormalities of the rectum.   The LB [id number]  endoscope was introduced through the anus and advanced to the terminal ileum which was intubated for a short distance. No adverse events experienced.   The quality of the prep was excellent.  The instrument was then slowly withdrawn as the colon was fully examined. Estimated blood loss is zero unless otherwise noted in this procedure report.  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  Retroflexion was not performed due to a narrow rectal vault. The time to cecum = 5.3 Withdrawal time = 8.4   The scope was withdrawn and the procedure completed. COMPLICATIONS: There were no immediate complications.  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  Office will call with the results. 2.  Start Lialda 4.8 g daily and Cortenema  eSigned:  Gatha Mayer, MD, [facility] 10/12/2014 9:53 AM   cc: the Patient

## 2014-10-12 NOTE — Progress Notes (Signed)
Report to PACU, RN, vss, BBS= Clear.  

## 2014-10-12 NOTE — Progress Notes (Signed)
Called to room to assist during endoscopic procedure.  Patient ID and intended procedure confirmed with present staff. Received instructions for my participation in the procedure from the performing physician.  

## 2014-10-12 NOTE — Patient Instructions (Addendum)
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  I also removed one tiny polyp.  I am prescribing Lialda and cortenema to treat the proctitis.  Will call with results and plans.  I appreciate the opportunity to care for you. Gatha Mayer, MD, FACG     YOU HAD AN ENDOSCOPIC PROCEDURE TODAY AT [facility]:   Refer to the procedure report that was given to you for any specific questions about what was found during the examination.  If the procedure report does not answer your questions, please call your gastroenterologist to clarify.  If you requested that your care partner not be given the details of your procedure findings, then the procedure report has been included in a sealed envelope for you to review at your convenience later.  YOU SHOULD EXPECT: Some feelings of bloating in the abdomen. Passage of more gas than usual.  Walking can help get rid of the air that was put into your GI tract during the procedure and reduce the bloating. If you had a lower endoscopy (such as a colonoscopy or flexible sigmoidoscopy) you may notice spotting of blood in your stool or on the toilet paper. If you underwent a bowel prep for your procedure, you may not have a normal bowel movement for a few days.  Please Note:  You might notice some irritation and congestion in your nose or some drainage.  This is from the oxygen used during your procedure.  There is no need for concern and it should clear up in a day or so.  SYMPTOMS TO REPORT IMMEDIATELY:   Following lower endoscopy (colonoscopy or flexible sigmoidoscopy):  Excessive amounts of blood in the stool  Significant tenderness or worsening of abdominal pains  Swelling of the abdomen that is new, acute  Fever of 100F or higher   For urgent or emergent issues, a gastroenterologist can be reached at any hour by calling [phone].   DIET: Your first meal following the procedure should be a small  meal and then it is ok to progress to your normal diet. Heavy or fried foods are harder to digest and may make you feel nauseous or bloated.  Likewise, meals heavy in dairy and vegetables can increase bloating.  Drink plenty of fluids but you should avoid alcoholic beverages for 24 hours.  ACTIVITY:  You should plan to take it easy for the rest of today and you should NOT DRIVE or use heavy machinery until tomorrow (because of the sedation medicines used during the test).    FOLLOW UP: Our staff will call the number listed on your records the next business day following your procedure to check on you and address any questions or concerns that you may have regarding the information given to you following your procedure. If we do not reach you, we will leave a message.  However, if you are feeling well and you are not experiencing any problems, there is no need to return our call.  We will assume that you have returned to your regular daily activities without incident.  If any biopsies were taken you will be contacted by phone or by letter within the next 1-3 weeks.  Please call us at [phone] if you have not heard about the biopsies in 3 weeks.    SIGNATURES/CONFIDENTIALITY: You and/or your care partner have signed paperwork which will be entered into your electronic medical record.  These signatures attest to the fact that that  the information above on your After Visit Summary has been reviewed and is understood.  Full responsibility of the confidentiality of this discharge information lies with you and/or your care-partner.    Handouts were given to your care partner on polyps, diverticulosis, low fiber diet, and proctitis. You may resume your current medications today.  New rx sent to the pharmacy from dr. Carlean Purl. Await biopsies results. Please call if any questions or concerns.

## 2014-10-12 NOTE — Progress Notes (Signed)
No problems noted in the recovery room. maw 

## 2014-10-13 ENCOUNTER — Telehealth: Payer: Self-pay

## 2014-10-13 ENCOUNTER — Telehealth: Payer: Self-pay | Admitting: Internal Medicine

## 2014-10-13 DIAGNOSIS — Z86010 Personal history of colonic polyps: Secondary | ICD-10-CM

## 2014-10-13 DIAGNOSIS — K51311 Ulcerative (chronic) rectosigmoiditis with rectal bleeding: Secondary | ICD-10-CM

## 2014-10-13 NOTE — Telephone Encounter (Signed)
Silverscript Sir.

## 2014-10-13 NOTE — Telephone Encounter (Signed)
Need full name of her Medicare Part D plan and will see what we can do I am afraid the Cortenema will be too expensive also

## 2014-10-13 NOTE — Telephone Encounter (Signed)
Please advise Sir, thank you. 

## 2014-10-13 NOTE — Telephone Encounter (Signed)
  Follow up Call-  Call back number 10/12/2014 03/31/2013 04/03/2012  Post procedure Call Back phone  # [phone]  Permission to leave phone message Yes Yes Yes     Patient questions:  Do you have a fever, pain , or abdominal swelling? No. Pain Score  0 *  Have you tolerated food without any problems? Yes.    Have you been able to return to your normal activities? Yes.    Do you have any questions about your discharge instructions: Diet   No. Medications  No. Follow up visit  No.  Do you have questions or concerns about your Care? No.  Actions: * If pain score is 4 or above: No action needed, pain <4.

## 2014-10-19 ENCOUNTER — Encounter: Payer: Self-pay | Admitting: Internal Medicine

## 2014-10-19 DIAGNOSIS — Z860101 Personal history of adenomatous and serrated colon polyps: Secondary | ICD-10-CM | POA: Insufficient documentation

## 2014-10-19 DIAGNOSIS — Z86010 Personal history of colonic polyps: Secondary | ICD-10-CM

## 2014-10-19 HISTORY — DX: Personal history of colonic polyps: Z86.010

## 2014-10-19 HISTORY — DX: Personal history of adenomatous and serrated colon polyps: Z86.0101

## 2014-10-19 MED ORDER — FOLIC ACID 1 MG PO TABS: 1.0000 mg | ORAL_TABLET | Freq: Every day | ORAL | Status: DC

## 2014-10-19 MED ORDER — SULFASALAZINE 500 MG PO TABS: 1000.0000 mg | ORAL_TABLET | Freq: Four times a day (QID) | ORAL | Status: DC

## 2014-10-19 NOTE — Progress Notes (Signed)
Quick Note:  Patient informed - does not need letter Recall colon 2021 please   She will start Cortenema x 28 d and sulfasalazine 1 g qid (cost problems with other Rx)  CBC 1 month and RTC 2 months      ______

## 2014-10-19 NOTE — Assessment & Plan Note (Signed)
Cortenema, sulfasalazine and folic acid tx

## 2014-10-26 ENCOUNTER — Telehealth: Payer: Self-pay | Admitting: Internal Medicine

## 2014-10-26 NOTE — Telephone Encounter (Signed)
A user error has taken place.

## 2014-10-28 ENCOUNTER — Ambulatory Visit: Payer: Medicare Other | Admitting: Cardiology

## 2014-10-28 ENCOUNTER — Encounter: Payer: Self-pay | Admitting: Cardiology

## 2014-10-28 ENCOUNTER — Ambulatory Visit (INDEPENDENT_AMBULATORY_CARE_PROVIDER_SITE_OTHER): Payer: Medicare Other | Admitting: Cardiology

## 2014-10-28 VITALS — BP 126/72 | HR 70 | Ht 62.5 in | Wt 194.0 lb

## 2014-10-28 DIAGNOSIS — E785 Hyperlipidemia, unspecified: Secondary | ICD-10-CM

## 2014-10-28 DIAGNOSIS — Z8249 Family history of ischemic heart disease and other diseases of the circulatory system: Secondary | ICD-10-CM

## 2014-10-28 MED ORDER — ROSUVASTATIN CALCIUM 20 MG PO TABS: 20.0000 mg | ORAL_TABLET | Freq: Every day | ORAL | Status: DC

## 2014-10-28 NOTE — Patient Instructions (Signed)
Medication Instructions:  No changes today  Labwork: None today  Testing/Procedures: None today  Follow-Up: Your physician wants you to follow-up in: 1 year with Dr Aundra Dubin. (September 2017). You will receive a reminder letter in the mail two months in advance. If you don't receive a letter, please call our office to schedule the follow-up appointment.

## 2014-10-30 NOTE — Progress Notes (Signed)
Patient ID: Rachel Carlson, female   DOB: 17-Oct-1945, 69 y.o.   MRN: [id number] PCP: Dr. Glori Bickers  69 yo with hyperlipidemia and family history of sudden cardiac death and CAD returns for followup.   She continues to look after her husband who has PLS. No chest pain or exertional dyspnea.  Occasional palpitations.  She has a history of PVCs.  The palpitations really are not bothering her.  No lightheadedness or syncope.   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   Labs (9/10):  LDL 58, HDL 57, AST 27, ALT 36 Labs (9/11): LDL 71, HDL 54 Labs (5/12): K 4.4, creatinine 0.7, TSH normal, LDL 88, HDL 56 Labs (5/13): K 4.4, creatinine 0.6, LDL 55, HDL 66 Labs (5/14): LDL 60, HDL 62 Labs (6/15): LDL 75, HDL 56, K 4, creatinine 0.7 Labs (6/16): K 4.2, creatinine 0.64, LDL 81, HDL 55  ECG: NSR, normal   Allergies (verified):  No Known Drug Allergies  Past Medical History: 1. Hyperlipidemia 2. 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  Holter (10/13) with occasional PVCs 4. Echo (6/10): EF 36-62%, normal diastolic function, normal RV size and function, mild MR, PASP 33 mmHg.  5. GERD with esophageal stricture.  6. Plantar fasciitis 7. Ulcerative proctitis  Family History: Father: sudden death age 51- "coronary arteriosclerosis" on death certificate as cause of death.  Mother: CAD, MI in 62's, TIA's before that, sudden death- ? cardiac , DM 2  Siblings: Brother with arrythmia, colon cancer.  Sister has breast cancer.  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   Current Outpatient Prescriptions  Medication Sig Dispense Refill  . aspirin 81 MG tablet Take 81 mg by mouth daily.      . calcium citrate-vitamin D (CITRACAL+D) 315-200 MG-UNIT per tablet Take 1  tablet by mouth 2 (two) times daily.     . Cholecalciferol (VITAMIN D-3) 5000 UNITS TABS 3,000 Units. 1 tablet by mouth on Monday,Wednesday and Friday    . Coenzyme Q10 (CO Q 10 PO) Take 400 Units by mouth daily.      . Cranberry 300 MG tablet Take 300 mg by mouth daily.      . folic acid (FOLVITE) 1 MG tablet Take 1 tablet (1 mg total) by mouth daily. 30 tablet 11  . Glucosamine-Chondroitin (GLUCOSAMINE CHONDR COMPLEX PO) Take 2 capsules by mouth daily.     . Grape Seed Extract 100 MG CAPS Take 1 capsule by mouth daily.     . hydrocortisone (ANUSOL-HC) 2.5 % rectal cream Place 1 application rectally 2 (two) times daily. 30 g 1  . hydrocortisone (CORTENEMA) 100 MG/60ML enema Place 1 enema (100 mg total) rectally at bedtime. 30 enema 0  . Omega-3 Fatty Acids (FISH OIL TRIPLE STRENGTH) 1400 MG CAPS Take by mouth daily.      . RABEprazole (ACIPHEX) 20 MG tablet Take 1 tablet (20 mg total) by mouth daily. 30 tablet 11  . raloxifene (EVISTA) 60 MG tablet Take 1 tablet (60 mg total) by mouth daily. 30 tablet 11  . rosuvastatin (CRESTOR) 20 MG tablet Take 1 tablet (20 mg total) by mouth at bedtime. 90 tablet 3  . sulfaSALAzine (AZULFIDINE) 500 MG tablet Take 2 tablets (1,000 mg total) by mouth 4 (four) times daily. Start  with 1 tab at a time (qid) for first week then go to 2 240 tablet 5  . VITAMIN B COMPLEX-C CAPS Take 1 capsule by mouth daily.      No current facility-administered medications for this visit.    BP 126/72 mmHg  Pulse 70  Ht 5' 2.5" (1.588 m)  Wt 194 lb (87.998 kg)  BMI 34.90 kg/m2  LMP 05/29/1996 General: NAD, overweight Neck: No JVD, no thyromegaly or thyroid nodule.  Lungs: Clear to auscultation bilaterally with normal respiratory effort. CV: Nondisplaced PMI.  Heart regular S1/S2, no S3/S4, no murmur.  No peripheral edema.  No carotid bruit.  Normal pedal pulses.  Abdomen: Soft, nontender, no hepatosplenomegaly, no distention.  Neurologic: Alert and oriented x 3.  Psych:  Normal affect. Extremities: No clubbing or cyanosis.   Assessment/Plan 1. Family history of coronary artery disease  No chest pain or exertional dyspnea. Lipids are excellent on Crestor. Would continue Crestor and low-dose ASA.  2. Elevated blood pressure BP seems to be reasonably controlled at this time.   3. Palpitations Stable and manageable.  Likely due to PVCs.  No changes at this time.  4. Obesity Needs to work on weight loss.  We discussed dietary changes and increasing activity level.   Rachel Carlson 10/30/2014

## 2014-11-04 DIAGNOSIS — H04123 Dry eye syndrome of bilateral lacrimal glands: Secondary | ICD-10-CM | POA: Diagnosis not present

## 2014-11-08 ENCOUNTER — Telehealth: Payer: Self-pay | Admitting: Internal Medicine

## 2014-11-08 NOTE — Telephone Encounter (Signed)
Please list sulfasalazine allergy  Is she still using Cortenema?  All mesalamine is expensive for her but not sure we are going to have another option

## 2014-11-08 NOTE — Telephone Encounter (Signed)
Patient stopped sulfasalazine due to nausea, headache, rash, chills, temp 101 fever.  Temp resolved after being off sulfasalazine 24 hours.  She has stopped sulfasalazine and took benadryl q 6 hours for 2 days.  Rash has improved.  Please advise next step.  She is not sure she can afford Lialda.

## 2014-11-08 NOTE — Telephone Encounter (Signed)
She is using them every other day now.  She was taking every other day for 2 weeks.  Does she ned to start on Lialda?

## 2014-11-09 MED ORDER — MESALAMINE 1.2 G PO TBEC: 2.4000 g | DELAYED_RELEASE_TABLET | Freq: Every day | ORAL | Status: DC

## 2014-11-09 NOTE — Telephone Encounter (Signed)
I recommend starting Lialda at 2.4 g daily  Can Rx x 1 year 90 d ve 30 d her preference  If she can research and see if anything like Asacol, Delzicol, Apriso or possiby balsalazide is cheaper we can try one of them

## 2014-11-09 NOTE — Telephone Encounter (Signed)
I provided her samples of Lialda until she can do research on a better covered mesalamine.  Lialda will cost her $1500 a month.  If there is not a better covered drug she will call back and we will have her apply to the patient assistance program.   She will call back one day next week.

## 2014-12-21 ENCOUNTER — Ambulatory Visit (INDEPENDENT_AMBULATORY_CARE_PROVIDER_SITE_OTHER): Payer: Medicare Other | Admitting: Internal Medicine

## 2014-12-21 ENCOUNTER — Encounter: Payer: Self-pay | Admitting: Internal Medicine

## 2014-12-21 VITALS — BP 126/72 | HR 70 | Ht 62.5 in | Wt 187.0 lb

## 2014-12-21 DIAGNOSIS — K51318 Ulcerative (chronic) rectosigmoiditis with other complication: Secondary | ICD-10-CM | POA: Diagnosis not present

## 2014-12-21 MED ORDER — MESALAMINE 1.2 G PO TBEC: 2.4000 g | DELAYED_RELEASE_TABLET | Freq: Every day | ORAL | Status: DC

## 2014-12-21 MED ORDER — HYDROCORTISONE 100 MG/60ML RE ENEM: 1.0000 | ENEMA | Freq: Every evening | RECTAL | Status: DC | PRN

## 2014-12-21 NOTE — Patient Instructions (Signed)
Today we are giving you samples of Lialda to use.    We are giving you a printed rx for cortenema to use if you need it.   Follow up with Dr Carlean Purl in 3 months.    I appreciate the opportunity to care for you. Silvano Rusk, MD, [facility]

## 2014-12-21 NOTE — Assessment & Plan Note (Signed)
Trying to get Lialda See me 3 months

## 2014-12-21 NOTE — Progress Notes (Signed)
   Subjective:    Patient ID: Rachel Carlson, female    DOB: 10-Oct-1945, 69 y.o.   MRN: [id number] Cc: f/u ulcerative proctosigmoiditis HPI  Doing better but not on Tx at this tine. Used about 21 cortenema and is better but still with some rectal pain and diarrhea. She has not gone on Lialda as concerned about cost but is hoping to start samples and then maybe get cheaper Rx with change in Rx coverage.  Medications, allergies, past medical history, past surgical history, family history and social history are reviewed and updated in the EMR.   Review of Systems As above    Objective:   Physical Exam  BP 126/72 mmHg  Pulse 70  Ht 5' 2.5" (1.588 m)  Wt 187 lb (84.823 kg)  BMI 33.64 kg/m2  LMP 05/29/1996 NAD    Assessment & Plan:  Ulcerative proctosigmoiditis, other complication ([place])  More Lialda samples and start Rx at 2.4 g daily Refill Cortenema x 1  See me 3 months   15 minutes time spent with patient > half in counseling coordination of care

## 2014-12-22 ENCOUNTER — Encounter: Payer: Self-pay | Admitting: Internal Medicine

## 2015-02-02 ENCOUNTER — Other Ambulatory Visit: Payer: Self-pay | Admitting: Internal Medicine

## 2015-03-23 ENCOUNTER — Ambulatory Visit (INDEPENDENT_AMBULATORY_CARE_PROVIDER_SITE_OTHER): Payer: Medicare Other | Admitting: Internal Medicine

## 2015-03-23 ENCOUNTER — Encounter: Payer: Self-pay | Admitting: Internal Medicine

## 2015-03-23 VITALS — BP 130/82 | HR 70 | Ht 62.5 in | Wt 192.8 lb

## 2015-03-23 DIAGNOSIS — K5130 Ulcerative (chronic) rectosigmoiditis without complications: Secondary | ICD-10-CM | POA: Diagnosis not present

## 2015-03-23 DIAGNOSIS — K219 Gastro-esophageal reflux disease without esophagitis: Secondary | ICD-10-CM

## 2015-03-23 NOTE — Progress Notes (Signed)
   Subjective:    Patient ID: Rachel Carlson, female    DOB: 1945-07-21, 70 y.o.   MRN: [id number] Cc: f/u ulcerative proctosigmoiditis HPI  The patient is here, for follow-up. She had ulcerative proctosigmoiditis diagnosed last year. She used hydrocortisone enema and cream, and then got on Lialda at the end of last year. She reports that she is doing well without diarrhea or bleeding. There is some occasional mild rectal pain. She has not used any topical treatment in over a month. She actually got constipated and resumed her normal diet. She relates that he couldn't remember off and on since 2004, that in June in even year she seemed to have intermittent gas and mucus production from the rectum. Otherwise doing well. No heartburn symptoms or problems on her rabeprazole. Medications, allergies, past medical history, past surgical history, family history and social history are reviewed and updated in the EMR.  Review of Systems As above    Objective:   Physical Exam BP 130/82 mmHg  Pulse 70  Ht 5' 2.5" (1.588 m)  Wt 192 lb 12.8 oz (87.454 kg)  BMI 34.68 kg/m2  LMP 05/29/1996 No Acute distress    Assessment & Plan:  Ulcerative proctosigmoiditis Clinical remission Stay on Lialda RTC 1 year sooner prn  GERD (gastroesophageal reflux disease) Continue rapeprazole RTC 1 year

## 2015-03-23 NOTE — Assessment & Plan Note (Signed)
Clinical remission Stay on Lialda RTC 1 year sooner prn

## 2015-03-23 NOTE — Patient Instructions (Signed)
  Glad your doing well.   Follow up with Dr Carlean Purl in a year or sooner if needed.     I appreciate the opportunity to care for you. Silvano Rusk, MD, [facility]

## 2015-03-23 NOTE — Assessment & Plan Note (Signed)
Continue rapeprazole RTC 1 year

## 2015-04-27 ENCOUNTER — Telehealth: Payer: Self-pay

## 2015-04-27 DIAGNOSIS — E2839 Other primary ovarian failure: Secondary | ICD-10-CM

## 2015-04-27 NOTE — Telephone Encounter (Signed)
Pt left v/m requesting order for bone density at [facility]. Last bone density was 05/20/13.

## 2015-04-27 NOTE — Telephone Encounter (Signed)
Ref done I will route to [facility]

## 2015-05-03 ENCOUNTER — Other Ambulatory Visit: Payer: Self-pay | Admitting: Family Medicine

## 2015-05-03 DIAGNOSIS — Z1231 Encounter for screening mammogram for malignant neoplasm of breast: Secondary | ICD-10-CM

## 2015-05-26 ENCOUNTER — Ambulatory Visit
Admission: RE | Admit: 2015-05-26 | Discharge: 2015-05-26 | Disposition: A | Discharge status: Home / Self Care | Payer: Medicare Other | Source: Ambulatory Visit | Attending: Family Medicine | Admitting: Family Medicine

## 2015-05-26 DIAGNOSIS — E2839 Other primary ovarian failure: Secondary | ICD-10-CM

## 2015-05-26 DIAGNOSIS — Z1231 Encounter for screening mammogram for malignant neoplasm of breast: Secondary | ICD-10-CM | POA: Diagnosis not present

## 2015-05-26 DIAGNOSIS — M8589 Other specified disorders of bone density and structure, multiple sites: Secondary | ICD-10-CM | POA: Diagnosis not present

## 2015-05-26 DIAGNOSIS — Z780 Asymptomatic menopausal state: Secondary | ICD-10-CM | POA: Diagnosis not present

## 2015-05-26 LAB — HM MAMMOGRAPHY: HM MAMMO: NORMAL (ref 0–4)

## 2015-05-26 LAB — HM DEXA SCAN

## 2015-05-30 ENCOUNTER — Encounter: Payer: Self-pay | Admitting: *Deleted

## 2015-07-04 DIAGNOSIS — M180 Bilateral primary osteoarthritis of first carpometacarpal joints: Secondary | ICD-10-CM | POA: Diagnosis not present

## 2015-07-04 DIAGNOSIS — M79641 Pain in right hand: Secondary | ICD-10-CM | POA: Diagnosis not present

## 2015-07-04 DIAGNOSIS — M79632 Pain in left forearm: Secondary | ICD-10-CM | POA: Diagnosis not present

## 2015-07-04 DIAGNOSIS — M79642 Pain in left hand: Secondary | ICD-10-CM | POA: Diagnosis not present

## 2015-07-07 ENCOUNTER — Telehealth: Payer: Self-pay | Admitting: Family Medicine

## 2015-07-07 DIAGNOSIS — K219 Gastro-esophageal reflux disease without esophagitis: Secondary | ICD-10-CM

## 2015-07-07 DIAGNOSIS — E559 Vitamin D deficiency, unspecified: Secondary | ICD-10-CM

## 2015-07-07 DIAGNOSIS — E785 Hyperlipidemia, unspecified: Secondary | ICD-10-CM

## 2015-07-07 NOTE — Telephone Encounter (Signed)
-----   Message from Marchia Bond sent at 07/05/2015  7:47 AM EDT ----- Regarding: Cpx labs Tues 6/13, need orders. Thanks! :-) Please order  future cpx labs for pt's upcoming lab appt. Thanks Aniceto Boss

## 2015-07-12 ENCOUNTER — Telehealth: Payer: Self-pay

## 2015-07-12 ENCOUNTER — Other Ambulatory Visit (INDEPENDENT_AMBULATORY_CARE_PROVIDER_SITE_OTHER): Payer: Medicare Other

## 2015-07-12 DIAGNOSIS — K219 Gastro-esophageal reflux disease without esophagitis: Secondary | ICD-10-CM | POA: Diagnosis not present

## 2015-07-12 DIAGNOSIS — E559 Vitamin D deficiency, unspecified: Secondary | ICD-10-CM | POA: Diagnosis not present

## 2015-07-12 DIAGNOSIS — E785 Hyperlipidemia, unspecified: Secondary | ICD-10-CM

## 2015-07-12 LAB — LIPID PANEL
CHOLESTEROL: 163 mg/dL (ref 0–200)
HDL: 62.8 mg/dL (ref >39.00)
LDL Cholesterol: 77 mg/dL (ref 0–99)
NonHDL: 100.62
Total CHOL/HDL Ratio: 3
Triglycerides: 118 mg/dL (ref 0.0–149.0)
VLDL: 23.6 mg/dL (ref 0.0–40.0)

## 2015-07-12 LAB — CBC WITH DIFFERENTIAL/PLATELET
Basophils Absolute: 0 10*3/uL (ref 0.0–0.1)
Basophils Relative: 0.5 % (ref 0.0–3.0)
EOS PCT: 2.3 % (ref 0.0–5.0)
Eosinophils Absolute: 0.1 10*3/uL (ref 0.0–0.7)
HCT: 40.7 % (ref 36.0–46.0)
HEMOGLOBIN: 13.7 g/dL (ref 12.0–15.0)
Lymphocytes Relative: 30.5 % (ref 12.0–46.0)
Lymphs Abs: 1.6 10*3/uL (ref 0.7–4.0)
MCHC: 33.6 g/dL (ref 30.0–36.0)
MCV: 88.4 fl (ref 78.0–100.0)
MONO ABS: 0.5 10*3/uL (ref 0.1–1.0)
Monocytes Relative: 9.2 % (ref 3.0–12.0)
Neutro Abs: 3 10*3/uL (ref 1.4–7.7)
Neutrophils Relative %: 57.5 % (ref 43.0–77.0)
Platelets: 216 10*3/uL (ref 150.0–400.0)
RBC: 4.6 Mil/uL (ref 3.87–5.11)
RDW: 13.3 % (ref 11.5–15.5)
WBC: 5.2 10*3/uL (ref 4.0–10.5)

## 2015-07-12 LAB — COMPREHENSIVE METABOLIC PANEL
ALBUMIN: 4.3 g/dL (ref 3.5–5.2)
ALT: 22 U/L (ref 0–35)
AST: 17 U/L (ref 0–37)
Alkaline Phosphatase: 49 U/L (ref 39–117)
BILIRUBIN TOTAL: 0.5 mg/dL (ref 0.2–1.2)
BUN: 16 mg/dL (ref 6–23)
CO2: 26 mEq/L (ref 19–32)
Calcium: 9.5 mg/dL (ref 8.4–10.5)
Chloride: 106 mEq/L (ref 96–112)
Creatinine, Ser: 0.7 mg/dL (ref 0.40–1.20)
GFR: 88.56 mL/min (ref >60.00)
Glucose, Bld: 115 mg/dL — ABNORMAL HIGH (ref 70–99)
Potassium: 4.5 mEq/L (ref 3.5–5.1)
SODIUM: 139 meq/L (ref 135–145)
TOTAL PROTEIN: 7.1 g/dL (ref 6.0–8.3)

## 2015-07-12 LAB — VITAMIN D 25 HYDROXY (VIT D DEFICIENCY, FRACTURES): VITD: 50.09 ng/mL (ref 30.00–100.00)

## 2015-07-12 LAB — TSH: TSH: 3.08 u[IU]/mL (ref 0.35–4.50)

## 2015-07-12 NOTE — Telephone Encounter (Signed)
Patient came in and was given Lialda samples because she is in the donut hole.  She is concerned because she is having 3-6 bowel movements daily.  Throbbing at the anus and gas not as bad.  Please advise Sir.

## 2015-07-13 NOTE — Telephone Encounter (Signed)
Prednisone 10 mg tabs  4/day x 5 d 3/day x 5 days 2/day x 7 d 1/day x 7 d 1/2/day x 7 d Stop See me August - call back sooner if not better before

## 2015-07-14 MED ORDER — PREDNISONE 10 MG PO TABS: ORAL_TABLET | ORAL | Status: DC

## 2015-07-14 NOTE — Telephone Encounter (Signed)
  Spoke with Rachel Carlson and informed her of the plan, she will see [place] on 09/21/15 at 10AM.  Prednisone called into [facility] in [place].

## 2015-07-18 ENCOUNTER — Encounter: Payer: Medicare Other | Admitting: Family Medicine

## 2015-07-20 ENCOUNTER — Encounter: Payer: Self-pay | Admitting: Family Medicine

## 2015-07-20 ENCOUNTER — Ambulatory Visit (INDEPENDENT_AMBULATORY_CARE_PROVIDER_SITE_OTHER): Payer: Medicare Other | Admitting: Family Medicine

## 2015-07-20 VITALS — BP 128/74 | HR 56 | Temp 98.3°F | Ht 62.5 in | Wt 201.2 lb

## 2015-07-20 DIAGNOSIS — Z23 Encounter for immunization: Secondary | ICD-10-CM

## 2015-07-20 DIAGNOSIS — M810 Age-related osteoporosis without current pathological fracture: Secondary | ICD-10-CM

## 2015-07-20 DIAGNOSIS — R7303 Prediabetes: Secondary | ICD-10-CM | POA: Insufficient documentation

## 2015-07-20 DIAGNOSIS — K5130 Ulcerative (chronic) rectosigmoiditis without complications: Secondary | ICD-10-CM | POA: Diagnosis not present

## 2015-07-20 DIAGNOSIS — E559 Vitamin D deficiency, unspecified: Secondary | ICD-10-CM

## 2015-07-20 DIAGNOSIS — E785 Hyperlipidemia, unspecified: Secondary | ICD-10-CM | POA: Diagnosis not present

## 2015-07-20 DIAGNOSIS — R739 Hyperglycemia, unspecified: Secondary | ICD-10-CM

## 2015-07-20 DIAGNOSIS — E669 Obesity, unspecified: Secondary | ICD-10-CM | POA: Diagnosis not present

## 2015-07-20 MED ORDER — ROSUVASTATIN CALCIUM 20 MG PO TABS: 20.0000 mg | ORAL_TABLET | Freq: Every day | ORAL | Status: DC

## 2015-07-20 MED ORDER — RALOXIFENE HCL 60 MG PO TABS: 60.0000 mg | ORAL_TABLET | Freq: Every day | ORAL | Status: DC

## 2015-07-20 NOTE — Progress Notes (Signed)
Pre visit review using our clinic review tool, if applicable. No additional management support is needed unless otherwise documented below in the visit note. 

## 2015-07-20 NOTE — Progress Notes (Signed)
Subjective:    Patient ID: Rachel Carlson, female    DOB: 1945/08/23, 70 y.o.   MRN: [id number]  HPI Here for annual visit for chronic medical problems   Doing ok overall  A lot of garden work and pantry work  [employer] - very busy  Has not had her AMW visit yet  Family hx : Mother with breast cancer  Father sudden death at 73- ? 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 , and also chicken for protein (she does fry it)  Needs to avoid some high fiber foods for her colitis  bmi is 36-obese range   PNA- due for pcv 13 Gave her px for it last year  Will do it today    Td 12/07  Mammogram 4/17-negative Self breast exam - no lumps  Has had a hysterectomy   Colonoscopy 9/16-recall is in 2021 On Lialda in clinical remission for UC Still doing ok -never really felt bad/just a lot of diarrhea  Still has symptoms/proctitis (throbs) - it is not severe just annoying - has a px for prednisone waiting right now  Sees GI yearly  Zoster vaccine 5/11 Hep C screening neg 6/16  dexa 4/17-osteopenia improved from last study Vit D level 50.9 (hx of vit D def) Fall hx - none  Fracture hx -none  Is on evista - on it 2 years now   Labs: glucose is 115-getting a little higher  Wt is up  Frying chicken -too much  Cholesterol Lab Results  Component Value Date   CHOL 163 07/12/2015   CHOL 160 07/05/2014   CHOL 158 06/29/2013   Lab Results  Component Value Date   HDL 62.80 07/12/2015   HDL 54.80 07/05/2014   HDL 55.90 06/29/2013   Lab Results  Component Value Date   LDLCALC 77 07/12/2015   LDLCALC 81 07/05/2014   LDLCALC 75 06/29/2013   Lab Results  Component Value Date   TRIG 118.0 07/12/2015   TRIG 120.0 07/05/2014   TRIG 137.0 06/29/2013     Lab Results  Component Value Date   CHOLHDL 3 07/12/2015   CHOLHDL 3 07/05/2014   CHOLHDL 3 06/29/2013   Lab Results  Component Value Date   LDLDIRECT 257.3 06/04/2008   LDLDIRECT 203.9 06/04/2007  cholesterol is great ! crestor and diet   BP Readings from Last 3 Encounters:  07/20/15 128/74  03/23/15 130/82  12/21/14 126/72    Good bp control    Patient Active Problem List   Diagnosis Date Noted  . Hyperglycemia 07/20/2015  . Estrogen deficiency 04/27/2015  . GERD (gastroesophageal reflux disease) 03/23/2015  . Hx of adenomatous polyp of colon 10/19/2014  . Ulcerative proctosigmoiditis ([place]) 10/12/2014  . Diarrhea 08/10/2014  . Pain in joint, pelvic region and thigh 07/24/2014  . Need for hepatitis C screening test 07/12/2014  . Obesity 07/12/2014  . Pelvic pain in female 07/12/2014  . Vitamin D deficiency 07/03/2014  . Encounter for Medicare annual wellness exam 07/08/2013  . Rib pain on left side 07/08/2013  . Lower abdominal pain 07/08/2013  . Osteoporosis 05/24/2013  . Cough 03/03/2013  . 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  Adnexal pain 11/15/2010  . Urge incontinence 11/15/2010  . Infection of urinary tract 11/15/2010  . Family history of coronary artery disease 10/15/2010  . Elevated blood pressure 10/15/2010  . Routine general medical examination at a health care facility 06/01/2010  . [place], [place] 06/06/2009  . Hyperlipidemia 06/03/2007  . Urge urinary incontinence 06/03/2007  . MIGRAINES, HX OF 06/03/2007  . SYMPTOM, PALPITATIONS 11/13/2006   Past Medical History  Diagnosis Date  . Hyperlipidemia   . Obesity   . GERD (gastroesophageal reflux disease)   . PVC (premature ventricular contraction)     first dx by holter in 1980's, echo (6/10) EF 46-96%, normal diastolic fxn, normal size RV and fxn, mild MR, PASP 48mHg  . Chronic right ear pain     normal MRI  . Tubal pregnancy   . Blood  transfusion, without reported diagnosis   . Esophageal stricture   . Fibroids     uterine, history of  . History of uterine prolapse   . VT (ventricular tachycardia) ([place]   . NSTEMI (non-ST elevated myocardial infarction) ([facility]   . Intermittent atrial flutter ([facility]   . Urge urinary incontinence 06/03/2007    Qualifier: Diagnosis of  By: BMarcelino ScotCMA, [place]   . Blood transfusion without reported diagnosis   . Hx of adenomatous polyp of colon 10/19/2014  . Ulcerative proctosigmoiditis ([facility]    Past Surgical History  Procedure Laterality Date  . Ectopic pregnancy surgery  1980  . Partial hysterectomy      abdominal, fibroids, and prolapse (1998) bladder tack  . Bladder neck suspension  1998  . Colonoscopy  05/18/2011    Dr. CSilvano Rusk . Esophagogastroduodenoscopy (egd) with esophageal dilation  04/03/12    Dr. CSilvano Rusk . Upper gastrointestinal endoscopy     Social History  Substance Use Topics  . Smoking status: Never Smoker   . Smokeless tobacco: Never Used  . Alcohol Use: No     Comment: rarely beer   Family History  Problem Relation Age of Onset  . Sudden death Father 423   "coronary arteriosclerosis" on death certificate  . Heart attack Mother 779 . Transient ischemic attack Mother   . Diabetes type II Mother   . Sudden death Mother     ?cardiac  . Diabetes Mother   . Coronary artery disease Mother   . Arrhythmia Brother   . Colon cancer Brother     40's  . Breast cancer Sister   . Colon polyps Sister     adenomatous  . Other Son     pituitary tumor    Allergies  Allergen Reactions  . Pantoprazole Sodium     Abdominal pain, diarrhea  . Ranitidine     Diarrhea, abdominal pain  . Omeprazole Diarrhea    Abdominal pain also   . Sulfasalazine Rash   Current Outpatient Prescriptions on File Prior to Visit  Medication Sig Dispense Refill  . aspirin 81 MG tablet Take 81 mg by mouth daily.      . calcium citrate-vitamin D (CITRACAL+D) 315-200  MG-UNIT per tablet Take 1 tablet by mouth 2 (two) times daily.     . Cholecalciferol (VITAMIN D-3) 5000 UNITS TABS 3,000 Units. 1 tablet by mouth on Monday,Wednesday and Friday    . Coenzyme Q10 (CO Q 10 PO) Take 400 Units by mouth daily.      . COLOCORT 100 MG/60ML enema PLACE ONE ENEMA RECTALLY EVERY DAY AT BEDTIME 1680 mL  0  . Cranberry 300 MG tablet Take 300 mg by mouth daily.      . folic acid (FOLVITE) 1 MG tablet Take 1 tablet (1 mg total) by mouth daily. 30 tablet 11  . Glucosamine-Chondroitin (GLUCOSAMINE CHONDR COMPLEX PO) Take 2 capsules by mouth daily.     . Grape Seed Extract 100 MG CAPS Take 1 capsule by mouth daily.     . hydrocortisone (ANUSOL-HC) 2.5 % rectal cream Place 1 application rectally 2 (two) times daily. 30 g 1  . hydrocortisone (CORTENEMA) 100 MG/60ML enema Place 1 enema (100 mg total) rectally at bedtime as needed. 30 enema 0  . mesalamine (LIALDA) 1.2 G EC tablet Take 2 tablets (2.4 g total) by mouth daily with breakfast. 192 tablet 0  . Omega-3 Fatty Acids (FISH OIL TRIPLE STRENGTH) 1400 MG CAPS Take by mouth daily.      . RABEprazole (ACIPHEX) 20 MG tablet Take 1 tablet (20 mg total) by mouth daily. 30 tablet 11  . VITAMIN B COMPLEX-C CAPS Take 1 capsule by mouth daily.     . predniSONE (DELTASONE) 10 MG tablet Take 4 tabs for 5 days, 3 tabs for 5 days, 2 tabs for 7 days, 1 tab for 7 days, 1/2 tab for 7 days, stop (Patient not taking: Reported on 07/20/2015) 100 tablet 0   No current facility-administered medications on file prior to visit.    Review of Systems Review of Systems  Constitutional: Negative for fever, appetite change, fatigue and unexpected weight change.  Eyes: Negative for pain and visual disturbance.  Respiratory: Negative for cough and shortness of breath.   Cardiovascular: Negative for cp or palpitations    Gastrointestinal: Negative for nausea, diarrhea and constipation. Pos for symptoms of proctitis Genitourinary: Negative for urgency and  frequency.  Skin: Negative for pallor or rash   Neurological: Negative for weakness, light-headedness, numbness and headaches.  Hematological: Negative for adenopathy. Does not bruise/bleed easily.  Psychiatric/Behavioral: Negative for dysphoric mood. The patient is not nervous/anxious.         Objective:   Physical Exam  Constitutional: She appears well-developed and well-nourished. No distress.  obese and well appearing   HENT:  Head: Normocephalic and atraumatic.  Right Ear: External ear normal.  Left Ear: External ear normal.  Mouth/Throat: Oropharynx is clear and moist.  Eyes: Conjunctivae and EOM are normal. Pupils are equal, round, and reactive to light. No scleral icterus.  Neck: Normal range of motion. Neck supple. No JVD present. Carotid bruit is not present. No thyromegaly present.  Cardiovascular: Normal rate, regular rhythm, normal heart sounds and intact distal pulses.  Exam reveals no gallop.   Pulmonary/Chest: Effort normal and breath sounds normal. No respiratory distress. She has no wheezes. She exhibits no tenderness.  Abdominal: Soft. Bowel sounds are normal. She exhibits no distension, no abdominal bruit and no mass. There is no tenderness.  Genitourinary: No breast swelling, tenderness, discharge or bleeding.  Breast exam: No mass, nodules, thickening, tenderness, bulging, retraction, inflamation, nipple discharge or skin changes noted.  No axillary or clavicular LA.      Musculoskeletal: Normal range of motion. She exhibits no edema or tenderness.  Lymphadenopathy:    She has no cervical adenopathy.  Neurological: She is alert. She has normal reflexes. No cranial nerve deficit. She exhibits normal muscle tone. Coordination normal.  Skin: Skin is warm and dry. No rash noted. No erythema. No pallor.  Solar lentigines  Some small sks   Psychiatric: She has a  normal mood and affect.          Assessment & Plan:   Problem List Items Addressed This Visit       Digestive   Ulcerative proctosigmoiditis ([place])    Pt has to watch her diet - about to start a short steroid enema for proctitis symptoms Continues GI f/u  Continues Lialda         Musculoskeletal and Integument   Osteoporosis    slt imp on last dexa  On evista 2 y - will give for 5 y  Disc need for calcium/ vitamin D/ wt bearing exercise and bone density test every 2 y to monitor Disc safety/ fracture risk in detail        Relevant Medications   raloxifene (EVISTA) 60 MG tablet     Other   Vitamin D deficiency    Vitamin D level is therapeutic with current supplementation Disc importance of this to bone and overall health Improved at 50.9      Obesity    Wt is up with bmi of 36 Discussed how this problem influences overall health and the risks it imposes  Reviewed plan for weight loss with lower calorie diet (via better food choices and also portion control or program like weight watchers) and exercise building up to or more than 30 minutes 5 days per week including some aerobic activity         Hyperlipidemia - Primary    Disc goals for lipids and reasons to control them Rev labs with pt Rev low sat fat diet in detail Continue statin       Relevant Medications   rosuvastatin (CRESTOR) 20 MG tablet   Hyperglycemia    New mildly elevated fasting glucose  Will plan A1C after lifestyle change Disc need for wt loss and low glycemic diet to avoid DM2      Relevant Orders   Hemoglobin A1c    Other Visit Diagnoses    Hyperlipemia        Relevant Medications    rosuvastatin (CRESTOR) 20 MG tablet    Need for vaccination with 13-polyvalent pneumococcal conjugate vaccine        Relevant Orders    Pneumococcal conjugate vaccine 13-valent (Completed)

## 2015-07-20 NOTE — Patient Instructions (Addendum)
prevnar vaccine today  Take care of yourself  Work on weight loss and lower carbohydrate (sugar) diet since your glucose is mildly elevated   Schedule lab in 3 months for A1C for sugar  We will schedule medicare interview visit with Katha Cabal

## 2015-07-21 NOTE — Assessment & Plan Note (Signed)
Wt is up with bmi of 36 Discussed how this problem influences overall health and the risks it imposes  Reviewed plan for weight loss with lower calorie diet (via better food choices and also portion control or program like weight watchers) and exercise building up to or more than 30 minutes 5 days per week including some aerobic activity

## 2015-07-21 NOTE — Assessment & Plan Note (Signed)
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

## 2015-07-21 NOTE — Assessment & Plan Note (Signed)
slt imp on last dexa  On evista 2 y - will give for 5 y  Disc need for calcium/ vitamin D/ wt bearing exercise and bone density test every 2 y to monitor Disc safety/ fracture risk in detail

## 2015-07-21 NOTE — Assessment & Plan Note (Signed)
Vitamin D level is therapeutic with current supplementation Disc importance of this to bone and overall health Improved at 50.9

## 2015-07-21 NOTE — Assessment & Plan Note (Signed)
Pt has to watch her diet - about to start a short steroid enema for proctitis symptoms Continues GI f/u  Continues Lialda

## 2015-07-21 NOTE — Assessment & Plan Note (Signed)
New mildly elevated fasting glucose  Will plan A1C after lifestyle change Disc need for wt loss and low glycemic diet to avoid DM2

## 2015-07-21 NOTE — Assessment & Plan Note (Signed)
Disc goals for lipids and reasons to control them Rev labs with pt Rev low sat fat diet in detail Continue statin

## 2015-09-21 ENCOUNTER — Other Ambulatory Visit (INDEPENDENT_AMBULATORY_CARE_PROVIDER_SITE_OTHER): Payer: Medicare Other

## 2015-09-21 ENCOUNTER — Ambulatory Visit (INDEPENDENT_AMBULATORY_CARE_PROVIDER_SITE_OTHER): Payer: Medicare Other | Admitting: Internal Medicine

## 2015-09-21 ENCOUNTER — Encounter (INDEPENDENT_AMBULATORY_CARE_PROVIDER_SITE_OTHER): Payer: Self-pay

## 2015-09-21 ENCOUNTER — Encounter: Payer: Self-pay | Admitting: Internal Medicine

## 2015-09-21 VITALS — BP 138/90 | HR 88

## 2015-09-21 DIAGNOSIS — K51318 Ulcerative (chronic) rectosigmoiditis with other complication: Secondary | ICD-10-CM

## 2015-09-21 DIAGNOSIS — G5690 Unspecified mononeuropathy of unspecified upper limb: Secondary | ICD-10-CM

## 2015-09-21 DIAGNOSIS — M792 Neuralgia and neuritis, unspecified: Secondary | ICD-10-CM

## 2015-09-21 DIAGNOSIS — G63 Polyneuropathy in diseases classified elsewhere: Secondary | ICD-10-CM

## 2015-09-21 DIAGNOSIS — E538 Deficiency of other specified B group vitamins: Secondary | ICD-10-CM

## 2015-09-21 LAB — VITAMIN B12: VITAMIN B 12: 818 pg/mL (ref 211–911)

## 2015-09-21 NOTE — Assessment & Plan Note (Signed)
Flare resolved Stay on Lialda Prednisone vs cortenema if flares again See me 1 yr sooner prn

## 2015-09-21 NOTE — Patient Instructions (Signed)
  Your physician has requested that you go to the basement for the following lab work before leaving today: B12 level    Please follow up with Dr Carlean Purl in a year.      I appreciate the opportunity to care for you. Silvano Rusk, MD,FACG

## 2015-09-21 NOTE — Progress Notes (Signed)
   Subjective:    Patient ID: Rachel Carlson, female    DOB: 12-Jun-1945, 70 y.o.   MRN: [id number] Cc: f/u UC flare HPI In June was having diarrhea increase - up to 6 stools a day - no form and anal discomfort. I called in prednisone taper and sxs resolved.  Better now Back to baseline 3 formed stools/day Roughage from garden makes her stool more. She hs been eating vegetables more since summer garden crops available.   Medications, allergies, past medical history, past surgical history, family history and social history are reviewed and updated in the EMR.  Review of Systems Having sharp pains with pronation/supination in forearms She saw ortho - no dx    Objective:   Physical Exam BP 138/90 (BP Location: Left Arm, Patient Position: Sitting, Cuff Size: Normal)   Pulse 88   LMP 05/29/1996  NAD Bilat UE-s nontender - NL motpr strength    Assessment & Plan:   Encounter Diagnoses  Name Primary?  . 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 Neuropathic pain of forearm, unspecified laterality   . B12 neuropathy - ?     Ulcerative proctosigmoiditis Flare resolved Stay on Lialda Prednisone vs cortenema if flares again See me 1 yr sooner prn    Check B12 level ? If could be related to extremity sxs - it was NL See pcp about arm sxs  I appreciate the opportunity to care for this patient. LP:FXTKW Tower, MD

## 2015-09-21 NOTE — Progress Notes (Signed)
B12 normal so that is not cause of her pain issues See PCP for f/u

## 2015-09-25 ENCOUNTER — Encounter: Payer: Self-pay | Admitting: Internal Medicine

## 2015-09-30 ENCOUNTER — Other Ambulatory Visit: Payer: Self-pay | Admitting: Internal Medicine

## 2015-10-12 ENCOUNTER — Encounter: Payer: Self-pay | Admitting: Cardiology

## 2015-10-20 ENCOUNTER — Other Ambulatory Visit (INDEPENDENT_AMBULATORY_CARE_PROVIDER_SITE_OTHER): Payer: Medicare Other

## 2015-10-20 ENCOUNTER — Ambulatory Visit (INDEPENDENT_AMBULATORY_CARE_PROVIDER_SITE_OTHER): Payer: Medicare Other

## 2015-10-20 VITALS — BP 126/80 | HR 69 | Temp 98.4°F | Ht 62.5 in | Wt 203.0 lb

## 2015-10-20 DIAGNOSIS — Z0000 Encounter for general adult medical examination without abnormal findings: Secondary | ICD-10-CM | POA: Diagnosis not present

## 2015-10-20 DIAGNOSIS — R739 Hyperglycemia, unspecified: Secondary | ICD-10-CM | POA: Diagnosis not present

## 2015-10-20 LAB — HEMOGLOBIN A1C: HEMOGLOBIN A1C: 6.2 % (ref 4.6–6.5)

## 2015-10-20 NOTE — Progress Notes (Signed)
Subjective:   Rachel Carlson is a 70 y.o. female who presents for Medicare Annual (Subsequent) preventive examination.  Review of Systems:  N/A Cardiac Risk Factors include: advanced age (>1mn, >>41women);dyslipidemia;obesity (BMI >30kg/m2);sedentary lifestyle     Objective:     Vitals: BP 126/80 (BP Location: Left Arm, Patient Position: Sitting, Cuff Size: Normal)   Pulse 69   Temp 98.4 F (36.9 C) (Oral)   Ht 5' 2.5" (1.588 m) Comment: no shoes  Wt 203 lb (92.1 kg)   LMP 05/29/1996   SpO2 97%   BMI 36.54 kg/m   Body mass index is 36.54 kg/m.   Tobacco History  Smoking Status  . Never Smoker  Smokeless Tobacco  . Never Used     Counseling given: No   Past Medical History:  Diagnosis Date  . Blood transfusion without reported diagnosis   . Blood transfusion, without reported diagnosis   . Chronic right ear pain    normal MRI  . Esophageal stricture   . Fibroids    uterine, history of  . GERD (gastroesophageal reflux disease)   . History of uterine prolapse   . Hx of adenomatous polyp of colon 10/19/2014  . Hyperlipidemia   . Intermittent atrial flutter ([facility]   . NSTEMI (non-ST elevated myocardial infarction) ([place]   . Obesity   . PVC (premature ventricular contraction)    first dx by holter in 1980's, echo (6/10) EF 516-38% normal diastolic fxn, normal size RV and fxn, mild MR, PASP 364mg  . Tubal pregnancy   . Ulcerative pro[place]igmoiditis ([place]  . Urge urinary incontinence 06/03/2007   Qualifier: Diagnosis of  By: BrMarcelino ScotMA, AAAuburn Bilberry  . VT ([place]ricular tachycardia) ([place]   Past Surgical History:  Procedure Laterality Date  . BLADDER NECK SUSPENSION  1998  . COLONOSCOPY  05/18/2011   Dr. CaSilvano Rusk. ECTOPIC PREGNANCY SURGERY  1980  . ESOPHAGOGASTRODUODENOSCOPY (EGD) WITH ESOPHAGEAL DILATION  04/03/12   Dr. CaSilvano Rusk. PARTIAL HYSTERECTOMY     abdominal, fibroids, and prolapse (1998) bladder tack  . UPPER GASTROINTESTINAL  ENDOSCOPY     Family History  Problem Relation Age of Onset  . Sudden death Father 4538  "coronary arteriosclerosis" on death certificate  . Heart attack Mother 7083. Transient ischemic attack Mother   . Diabetes type II Mother   . Sudden death Mother     ?cardiac  . Diabetes Mother   . Coronary artery disease Mother   . Arrhythmia Brother   . Colon cancer Brother     40's  . Breast cancer Sister   . Colon polyps Sister     adenomatous  . Other Son     pituitary tumor    History  Sexual Activity  . Sexual activity: Not on file    Outpatient Encounter Prescriptions as of 10/20/2015  Medication Sig  . aspirin 81 MG tablet Take 81 mg by mouth daily.    . calcium citrate-vitamin D (CITRACAL+D) 315-200 MG-UNIT per tablet Take 1 tablet by mouth 2 (two) times daily.   . Cholecalciferol (VITAMIN D-3) 5000 UNITS TABS 3,000 Units. 1 tablet by mouth on Monday,Wednesday and Friday  . Coenzyme Q10 (CO Q 10 PO) Take 400 Units by mouth daily.    . COLOCORT 100 MG/60ML enema PLACE ONE ENEMA RECTALLY EVERY DAY AT BEDTIME  . Cranberry 300 MG tablet Take 300 mg by mouth daily.    . folic  acid (FOLVITE) 1 MG tablet Take 1 tablet (1 mg total) by mouth daily.  . Glucosamine-Chondroitin (GLUCOSAMINE CHONDR COMPLEX PO) Take 2 capsules by mouth daily.   . Grape Seed Extract 100 MG CAPS Take 1 capsule by mouth daily.   . hydrocortisone (ANUSOL-HC) 2.5 % rectal cream Place 1 application rectally 2 (two) times daily.  . hydrocortisone (CORTENEMA) 100 MG/60ML enema Place 1 enema (100 mg total) rectally at bedtime as needed.  . mesalamine (LIALDA) 1.2 G EC tablet Take 2 tablets (2.4 g total) by mouth daily with breakfast.  . Omega-3 Fatty Acids (FISH OIL TRIPLE STRENGTH) 1400 MG CAPS Take by mouth daily.    . predniSONE (DELTASONE) 10 MG tablet Take 4 tabs for 5 days, 3 tabs for 5 days, 2 tabs for 7 days, 1 tab for 7 days, 1/2 tab for 7 days, stop (Patient taking differently: as needed. Take 4 tabs for 5  days, 3 tabs for 5 days, 2 tabs for 7 days, 1 tab for 7 days, 1/2 tab for 7 days, stop)  . RABEprazole (ACIPHEX) 20 MG tablet TAKE ONE TABLET BY MOUTH ONCE DAILY  . raloxifene (EVISTA) 60 MG tablet Take 1 tablet (60 mg total) by mouth daily.  . rosuvastatin (CRESTOR) 20 MG tablet Take 1 tablet (20 mg total) by mouth at bedtime.  Marland Kitchen VITAMIN B COMPLEX-C CAPS Take 1 capsule by mouth daily.    No facility-administered encounter medications on file as of 10/20/2015.     Activities of Daily Living In your present state of health, do you have any difficulty performing the following activities: 10/20/2015  Hearing? Y  Vision? N  Difficulty concentrating or making decisions? N  Walking or climbing stairs? N  Dressing or bathing? N  Doing errands, shopping? N  Preparing Food and eating ? N  Using the Toilet? N  In the past six months, have you accidently leaked urine? Y  Do you have problems with loss of bowel control? N  Managing your Medications? N  Managing your Finances? N  Housekeeping or managing your Housekeeping? N  Some recent data might be hidden    Patient Care Team: Abner Greenspan, MD as PCP - General    Assessment:      Hearing Screening   125Hz  250Hz  500Hz  1000Hz  2000Hz  3000Hz  4000Hz  6000Hz  8000Hz   Right ear:   0 0 40  40    Left ear:   0 40 40  40      Visual Acuity Screening   Right eye Left eye Both eyes  Without correction:     With correction: 20/25 20/25 2025    Exercise Activities and Dietary recommendations Current Exercise Habits: The patient does not participate in regular exercise at present, Exercise limited by: None identified  Goals    . other          Starting 10/20/2015, I will continue to take medication as prescribed in an effort to remain healthy.       Fall Risk Fall Risk  10/20/2015 07/20/2015 07/12/2014 07/08/2013  Falls in the past year? No No No No   Depression Screen PHQ 2/9 Scores 10/20/2015 07/20/2015 07/12/2014 07/08/2013  PHQ - 2 Score 0  0 0 0     Cognitive Testing MMSE - Mini Mental State Exam 10/20/2015  Orientation to time 5  Orientation to Place 5  Registration 3  Attention/ Calculation 0  Recall 3  Language- name 2 objects 0  Language- repeat 1  Language- follow  3 step command 3  Language- read & follow direction 0  Write a sentence 0  Copy design 0  Total score 20   PLEASE NOTE: A Mini-Cog screen was completed. Maximum score is 20. A value of 0 denotes this part of Folstein MMSE was not completed or the patient failed this part of the Mini-Cog screening.   Mini-Cog Screening Orientation to Time - Max 5 pts Orientation to Place - Max 5 pts Registration - Max 3 pts Recall - Max 3 pts Language Repeat - Max 1 pts Language Follow 3 Step Command - Max 3 pts  Immunization History  Administered Date(s) Administered  . Influenza Split 12/04/2010  . Influenza Whole 12/29/2008, 11/22/2009  . Influenza-Unspecified 11/29/2012  . Pneumococcal Conjugate-13 07/20/2015  . Pneumococcal-Unspecified 01/30/2013  . Td 12/29/2005  . Zoster 06/10/2009   Screening Tests Health Maintenance  Topic Date Due  . INFLUENZA VACCINE  01/29/2016 (Originally 08/30/2015)  . TETANUS/TDAP  12/30/2015  . MAMMOGRAM  05/25/2016  . COLONOSCOPY  10/12/2019  . DEXA SCAN  Completed  . ZOSTAVAX  Completed  . Hepatitis C Screening  Completed  . PNA vac Low Risk Adult  Completed      Plan:     I have personally reviewed and addressed the Medicare Annual Wellness questionnaire and have noted the following in the patient's chart:  A. Medical and social history B. Use of alcohol, tobacco or illicit drugs  C. Current medications and supplements D. Functional ability and status E.  Nutritional status F.  Physical activity G. Advance directives H. List of other physicians I.  Hospitalizations, surgeries, and ER visits in previous 12 months J.  [place] to include hearing, vision, cognitive, depression L. Referrals and  appointments - none  In addition, I have reviewed and discussed with patient certain preventive protocols, quality metrics, and best practice recommendations. A written personalized care plan for preventive services as well as general preventive health recommendations were provided to patient.  See attached scanned questionnaire for additional information.   Signed,   Lindell Noe, MHA, BS, LPN Health Advisor

## 2015-10-20 NOTE — Progress Notes (Signed)
PCP notes:   Health maintenance:  Flu vaccine - postponed/pt will get vaccine at future date  Abnormal screenings:   Hearing - failed  Patient concerns:   None  Nurse concerns:  None  Next PCP appt:   06/2016

## 2015-10-20 NOTE — Progress Notes (Signed)
Pre visit review using our clinic review tool, if applicable. No additional management support is needed unless otherwise documented below in the visit note. 

## 2015-10-20 NOTE — Patient Instructions (Signed)
Ms. Fretz , Thank you for taking time to come for your Medicare Wellness Visit. I appreciate your ongoing commitment to your health goals. Please review the following plan we discussed and let me know if I can assist you in the future.   These are the goals we discussed: Goals    . other          Starting 10/20/2015, I will continue to take medication as prescribed in an effort to remain healthy.        This is a list of the screening recommended for you and due dates:  Health Maintenance  Topic Date Due  . Flu Shot  01/29/2016*  . Tetanus Vaccine  12/30/2015  . Mammogram  05/25/2016  . Colon Cancer Screening  10/12/2019  . DEXA scan (bone density measurement)  Completed  . Shingles Vaccine  Completed  .  Hepatitis C: One time screening is recommended by Center for Disease Control  (CDC) for  adults born from [birth year] through 1963.   Completed  . Pneumonia vaccines  Completed  *Topic was postponed. The date shown is not the original due date.   Preventive Care for Adults  A healthy lifestyle and preventive care can promote health and wellness. Preventive health guidelines for adults include the following key practices.  . A routine yearly physical is a good way to check with your health care provider about your health and preventive screening. It is a chance to share any concerns and updates on your health and to receive a thorough exam.  . Visit your dentist for a routine exam and preventive care every 6 months. Brush your teeth twice a day and floss once a day. Good oral hygiene prevents tooth decay and gum disease.  . The frequency of eye exams is based on your age, health, family medical history, use  of contact lenses, and other factors. Follow your health care provider's ecommendations for frequency of eye exams.  . Eat a healthy diet. Foods like vegetables, fruits, whole grains, low-fat dairy products, and lean protein foods contain the nutrients you need without too many  calories. Decrease your intake of foods high in solid fats, added sugars, and salt. Eat the right amount of calories for you. Get information about a proper diet from your health care provider, if necessary.  . Regular physical exercise is one of the most important things you can do for your health. Most adults should get at least 150 minutes of moderate-intensity exercise (any activity that increases your heart rate and causes you to sweat) each week. In addition, most adults need muscle-strengthening exercises on 2 or more days a week.  Silver Sneakers may be a benefit available to you. To determine eligibility, you may visit the website: www.silversneakers.com or contact program at 469-470-8244 Mon-Fri between 8AM-8PM.   . Maintain a healthy weight. The body mass index (BMI) is a screening tool to identify possible weight problems. It provides an estimate of body fat based on height and weight. Your health care provider can find your BMI and can help you achieve or maintain a healthy weight.   For adults 20 years and older: ? A BMI below 18.5 is considered underweight. ? A BMI of 18.5 to 24.9 is normal. ? A BMI of 25 to 29.9 is considered overweight. ? A BMI of 30 and above is considered obese.   . Maintain normal blood lipids and cholesterol levels by exercising and minimizing your intake of saturated fat. Eat a balanced  diet with plenty of fruit and vegetables. Blood tests for lipids and cholesterol should begin at age 57 and be repeated every 5 years. If your lipid or cholesterol levels are high, you are over 50, or you are at high risk for heart disease, you may need your cholesterol levels checked more frequently. Ongoing high lipid and cholesterol levels should be treated with medicines if diet and exercise are not working.  . If you smoke, find out from your health care provider how to quit. If you do not use tobacco, please do not start.  . If you choose to drink alcohol, please do  not consume more than 2 drinks per day. One drink is considered to be 12 ounces (355 mL) of beer, 5 ounces (148 mL) of wine, or 1.5 ounces (44 mL) of liquor.  . If you are 3-63 years old, ask your health care provider if you should take aspirin to prevent strokes.  . Use sunscreen. Apply sunscreen liberally and repeatedly throughout the day. You should seek shade when your shadow is shorter than you. Protect yourself by wearing long sleeves, pants, a wide-brimmed hat, and sunglasses year round, whenever you are outdoors.  . Once a month, do a whole body skin exam, using a mirror to look at the skin on your back. Tell your health care provider of new moles, moles that have irregular borders, moles that are larger than a pencil eraser, or moles that have changed in shape or color.

## 2015-10-21 ENCOUNTER — Encounter: Payer: Self-pay | Admitting: *Deleted

## 2015-10-23 NOTE — Progress Notes (Signed)
I reviewed health advisor's note, was available for consultation, and agree with documentation and plan.  

## 2015-10-26 ENCOUNTER — Ambulatory Visit (INDEPENDENT_AMBULATORY_CARE_PROVIDER_SITE_OTHER): Payer: Medicare Other | Admitting: Cardiology

## 2015-10-26 ENCOUNTER — Encounter (INDEPENDENT_AMBULATORY_CARE_PROVIDER_SITE_OTHER): Payer: Self-pay

## 2015-10-26 VITALS — BP 124/82 | HR 72 | Ht 62.5 in | Wt 203.0 lb

## 2015-10-26 DIAGNOSIS — Z8249 Family history of ischemic heart disease and other diseases of the circulatory system: Secondary | ICD-10-CM | POA: Diagnosis not present

## 2015-10-26 DIAGNOSIS — E785 Hyperlipidemia, unspecified: Secondary | ICD-10-CM

## 2015-10-26 MED ORDER — ROSUVASTATIN CALCIUM 20 MG PO TABS: 20.0000 mg | ORAL_TABLET | Freq: Every day | ORAL | 3 refills | Status: DC

## 2015-10-26 NOTE — Patient Instructions (Addendum)
Medication Instructions:  Do not take Crestor for 1 week to see if your muscle pain gets better. If it does not get better start taking it again. If it does get better call the office and let Dr Aundra Dubin know [phone]  Labwork: none  Testing/Procedures: none  Follow-Up: Your physician wants you to follow-up in: 1 year with Dr Percival Spanish in [place] or at the [facility]. (September 2018). You will receive a reminder letter in the mail two months in advance. If you don't receive a letter, please call our office to schedule the follow-up appointment.         If you need a refill on your cardiac medications before your next appointment, please call your pharmacy.

## 2015-10-27 ENCOUNTER — Encounter: Payer: Self-pay | Admitting: Cardiology

## 2015-10-27 NOTE — Progress Notes (Signed)
Patient ID: Rachel Carlson, female   DOB: 1946/01/04, 70 y.o.   MRN: [id number] PCP: Dr. Glori Bickers  70 yo with hyperlipidemia and family history of sudden cardiac death and CAD returns for followup.   She continues to look after her husband who has PLS. No chest pain or exertional dyspnea.  Occasional palpitations.  She has a history of PVCs.  The palpitations really are not bothering her.  No lightheadedness or syncope.  She has had pain in both forearms with lifting and carrying x 3 months and wonders if this is due to her statin.  Weight is up about 9 lbs.   Labs (9/10):  LDL 58, HDL 57, AST 27, ALT 36 Labs (9/11): LDL 71, HDL 54 Labs (5/12): K 4.4, creatinine 0.7, TSH normal, LDL 88, HDL 56 Labs (5/13): K 4.4, creatinine 0.6, LDL 55, HDL 66 Labs (5/14): LDL 60, HDL 62 Labs (6/15): LDL 75, HDL 56, K 4, creatinine 0.7 Labs (6/16): K 4.2, creatinine 0.64, LDL 81, HDL 55 Labs (6/17): LDL 77  ECG: NSR, normal   Allergies (verified):  No Known Drug Allergies  Past Medical History: 1. Hyperlipidemia 2. 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  Holter (10/13) with occasional PVCs 4. Echo (6/10): EF 67-89%, normal diastolic function, normal RV size and function, mild MR, PASP 33 mmHg.  5. GERD with esophageal stricture.  6. Plantar fasciitis 7. Ulcerative proctitis  Family History: Father: sudden death age 47- "coronary arteriosclerosis" on death certificate as cause of death.  Mother: CAD, MI in 74's, TIA's before that, sudden death- ? cardiac , DM 2  Siblings: Brother with arrythmia, colon cancer.  Sister has breast cancer.  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   Current Outpatient Prescriptions  Medication Sig Dispense Refill  . aspirin 81 MG tablet Take 81  mg by mouth daily.      . calcium citrate-vitamin D (CITRACAL+D) 315-200 MG-UNIT per tablet Take 1 tablet by mouth 2 (two) times daily.     . Cholecalciferol (VITAMIN D-3) 5000 UNITS TABS 3,000 Units. 1 tablet by mouth on Monday,Wednesday and Friday    . Coenzyme Q10 (CO Q 10 PO) Take 400 Units by mouth daily.      . Cranberry 300 MG tablet Take 300 mg by mouth daily.      . folic acid (FOLVITE) 1 MG tablet Take 1 tablet (1 mg total) by mouth daily. 30 tablet 11  . Glucosamine-Chondroitin (GLUCOSAMINE CHONDR COMPLEX PO) Take 2 capsules by mouth daily.     . Grape Seed Extract 100 MG CAPS Take 1 capsule by mouth daily.     . mesalamine (LIALDA) 1.2 G EC tablet Take 2 tablets (2.4 g total) by mouth daily with breakfast. 192 tablet 0  . Omega-3 Fatty Acids (FISH OIL TRIPLE STRENGTH) 1400 MG CAPS Take by mouth daily.      . RABEprazole (ACIPHEX) 20 MG tablet TAKE ONE TABLET BY MOUTH ONCE DAILY 30 tablet 11  . raloxifene (EVISTA) 60 MG tablet Take 1 tablet (60 mg total) by mouth daily. 90 tablet 3  . rosuvastatin (CRESTOR) 20 MG tablet Take 1 tablet (20 mg total) by mouth at bedtime. 90 tablet 3  . VITAMIN B COMPLEX-C CAPS Take 1 capsule by mouth daily.     Marland Kitchen  COLOCORT 100 MG/60ML enema PLACE ONE ENEMA RECTALLY EVERY DAY AT BEDTIME (Patient not taking: Reported on 10/26/2015) 1680 mL 0  . hydrocortisone (ANUSOL-HC) 2.5 % rectal cream Place 1 application rectally 2 (two) times daily. (Patient not taking: Reported on 10/26/2015) 30 g 1  . hydrocortisone (CORTENEMA) 100 MG/60ML enema Place 1 enema (100 mg total) rectally at bedtime as needed. (Patient not taking: Reported on 10/26/2015) 30 enema 0  . predniSONE (DELTASONE) 10 MG tablet Take 4 tabs for 5 days, 3 tabs for 5 days, 2 tabs for 7 days, 1 tab for 7 days, 1/2 tab for 7 days, stop (Patient not taking: Reported on 10/26/2015) 100 tablet 0   No current facility-administered medications for this visit.     BP 124/82   Pulse 72   Ht 5' 2.5" (1.588 m)    Wt 203 lb (92.1 kg)   LMP 05/29/1996   BMI 36.54 kg/m  General: NAD, overweight Neck: No JVD, no thyromegaly or thyroid nodule.  Lungs: Clear to auscultation bilaterally with normal respiratory effort. CV: Nondisplaced PMI.  Heart regular S1/S2, no S3/S4, no murmur.  No peripheral edema.  No carotid bruit.  Normal pedal pulses.  Abdomen: Soft, nontender, no hepatosplenomegaly, no distention.  Neurologic: Alert and oriented x 3.  Psych: Normal affect. Extremities: No clubbing or cyanosis.   Assessment/Plan 1. Family history of coronary artery disease  No chest pain or exertional dyspnea. Lipids have been excellent on Crestor.  - She has had pain in her arms with lifting/carrying for about 3 months.  She is concerned that this is from her statin.  I told her to try stopping Crestor for about 10 days.  If symptoms resolve, would consider trying pravastatin.  If no relief, can resume Crestor.  - Continue ASA 81 daily.  2. Palpitations Stable and manageable.  Likely due to PVCs.  No changes at this time.  3. Obesity Needs to work on weight loss.  We discussed dietary changes and increasing activity level.   Given my transition to CHF clinic, I will have her followup in 1 year with Dr Percival Spanish in [place].   [name] 10/27/2015

## 2015-10-28 NOTE — Addendum Note (Signed)
Addended by: Katrine Coho on: 10/28/2015 08:53 AM   Modules accepted: Orders

## 2015-11-17 ENCOUNTER — Telehealth: Payer: Self-pay | Admitting: Cardiology

## 2015-11-17 NOTE — Telephone Encounter (Signed)
Pt states she stopped crestor for at least 2 weeks. Pt states she did not notice a difference in her arm pain while she was off crestor.

## 2015-11-17 NOTE — Telephone Encounter (Signed)
Pt states she has restarted crestor and is willing to continue taking it regularly.

## 2015-11-17 NOTE — Telephone Encounter (Signed)
Agree with restart Crestor.

## 2015-11-17 NOTE — Telephone Encounter (Signed)
New message    Pt verbalized that she is calling for rn to speak to her about previous conversation about medication and about changing providers

## 2015-12-01 ENCOUNTER — Telehealth: Payer: Self-pay | Admitting: Internal Medicine

## 2015-12-01 MED ORDER — LIALDA 1.2 G PO TBEC: 2.4000 g | DELAYED_RELEASE_TABLET | Freq: Two times a day (BID) | ORAL | 5 refills | Status: DC

## 2015-12-01 MED ORDER — RABEPRAZOLE SODIUM 20 MG PO TBEC: 20.0000 mg | DELAYED_RELEASE_TABLET | Freq: Every day | ORAL | 3 refills | Status: DC

## 2015-12-01 NOTE — Telephone Encounter (Signed)
Patient requesting printed rx's for the Rabeprazole and the Lialda .  She knows to take the Lialda 2 every day not 2 BID as prescribed. She's having insurance issues.  She will come pick up the printed rx's.

## 2015-12-02 ENCOUNTER — Other Ambulatory Visit: Payer: Self-pay

## 2015-12-02 MED ORDER — HYDROCORTISONE 100 MG/60ML RE ENEM: 1.0000 | ENEMA | Freq: Every evening | RECTAL | 0 refills | Status: DC | PRN

## 2015-12-02 NOTE — Telephone Encounter (Signed)
Patient came in to pick up samples and requested another refill on her colocort enema's to be printed out.  Her daughter is due with her first baby in Dec and she is going to [place] to help her so she is trying to get all her meds stocked up to stay there 6 weeks.

## 2015-12-05 ENCOUNTER — Telehealth: Payer: Self-pay | Admitting: Cardiology

## 2015-12-05 NOTE — Telephone Encounter (Signed)
New Message:.    Please call,wants to talk to you about her last check up,which was 10-26-15.Pt says it is very important that she talks to you.

## 2015-12-05 NOTE — Telephone Encounter (Signed)
Pt has questions about bill for office visit with Dr Aundra Dubin in Sept 2017. Pt has been given phone number for  [facility] Physician Billing Customer [phone].

## 2015-12-06 IMAGING — CT CT ABD-PELV W/ CM
2 of 5 series · 16 of 46 positions shown, 18 images · IV contrast (Omnipaque 300)
Comparison: None.

CLINICAL DATA: Right lower quadrant abdominal pain, diarrhea for 3
weeks, frequent stools with mucus and blood

EXAM:
CT ABDOMEN AND PELVIS WITH CONTRAST
TECHNIQUE: Multidetector CT imaging of the abdomen and pelvis was performed
using the standard protocol following bolus administration of
intravenous contrast.
CONTRAST:  100mL OMNIPAQUE IOHEXOL 300 MG/ML  SOLN

[Series 2: abd/ pel 5mm · axial · 0.71mm/px · z∈[-448,-53]mm · 13 of 89 slices shown, 15 images]
[im 5/89  soft-tissue]
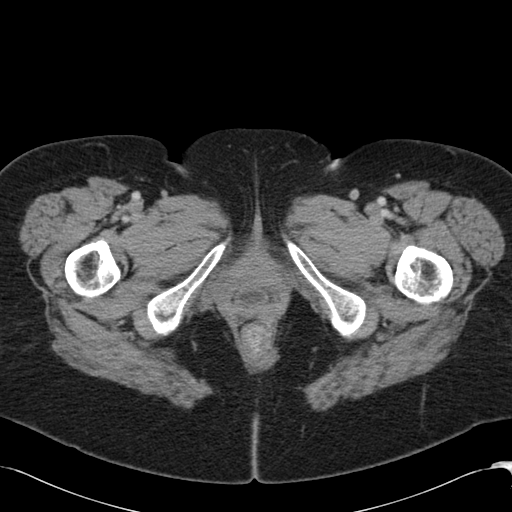
[im 5/89  bone]
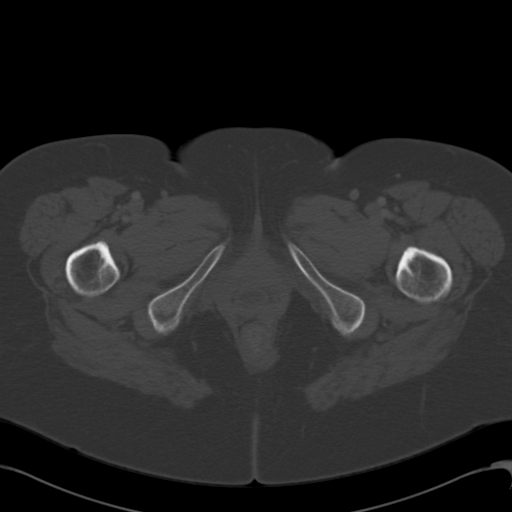
[im 14/89  soft-tissue]
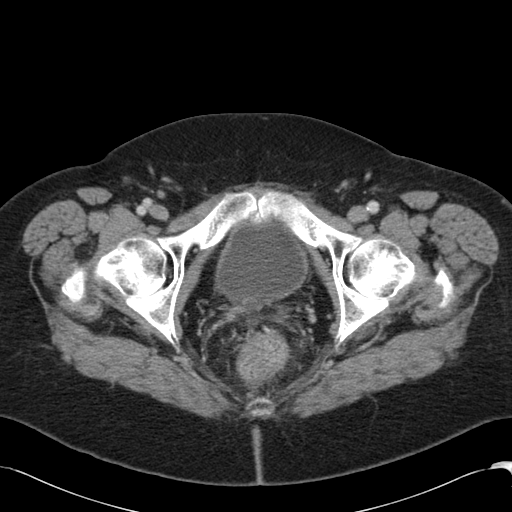
[im 19/89  soft-tissue]
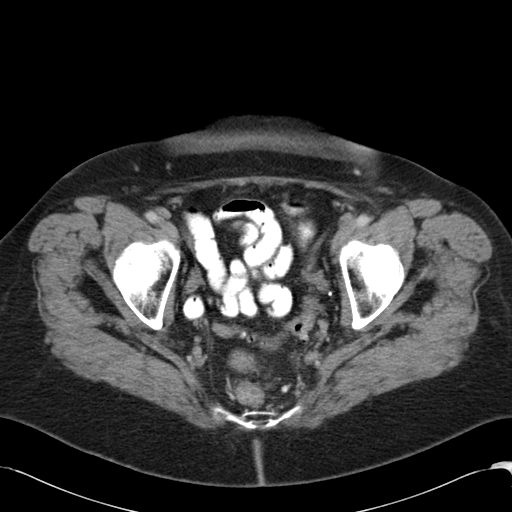
[im 24/89  soft-tissue]
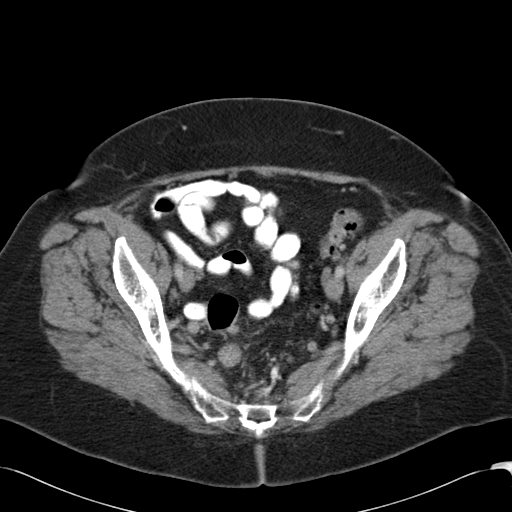
[im 33/89  soft-tissue]
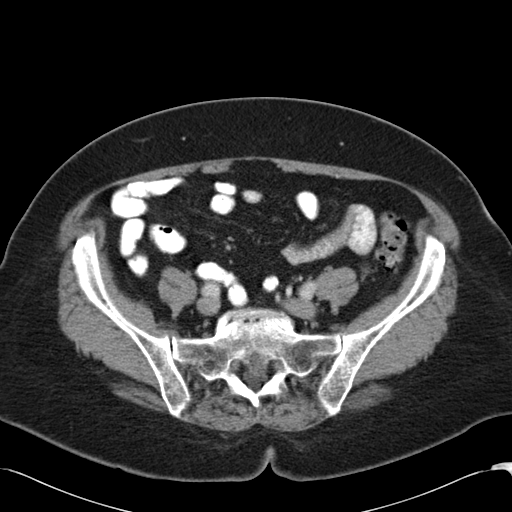
[im 38/89  soft-tissue]
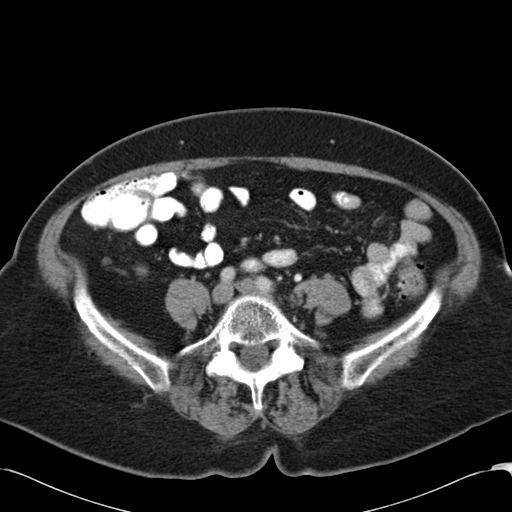
[im 47/89  soft-tissue]
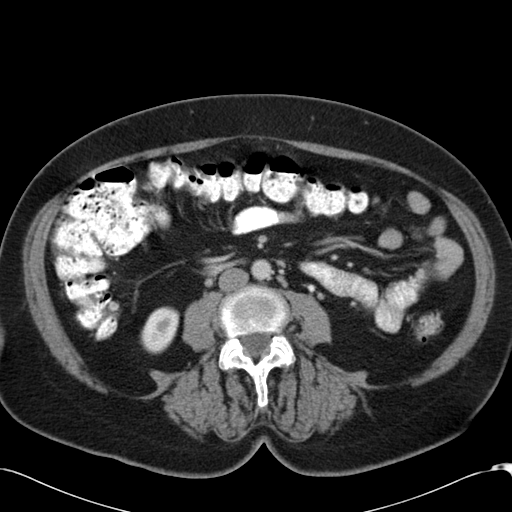
[im 51/89  soft-tissue]
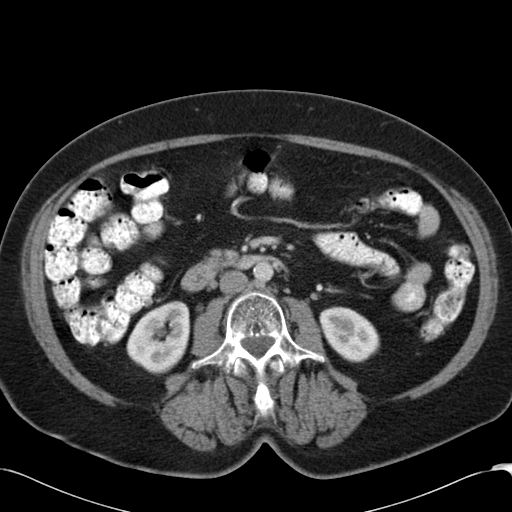
[im 56/89  soft-tissue]
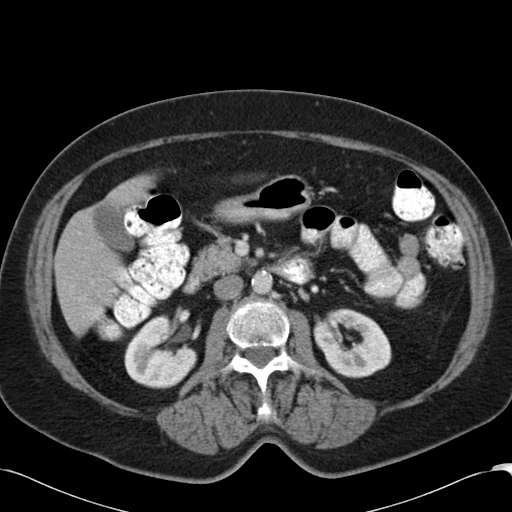
[im 56/89  bone]
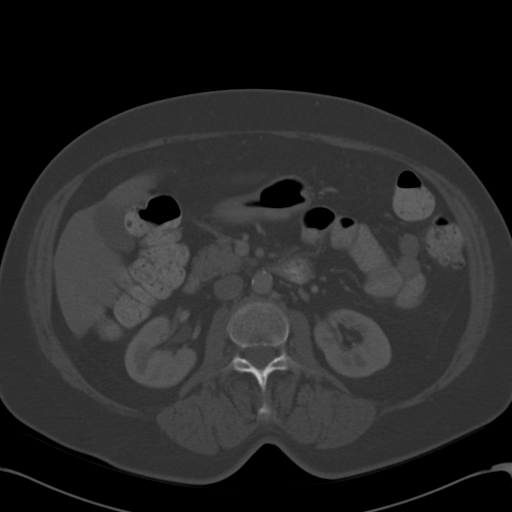
[im 65/89  soft-tissue]
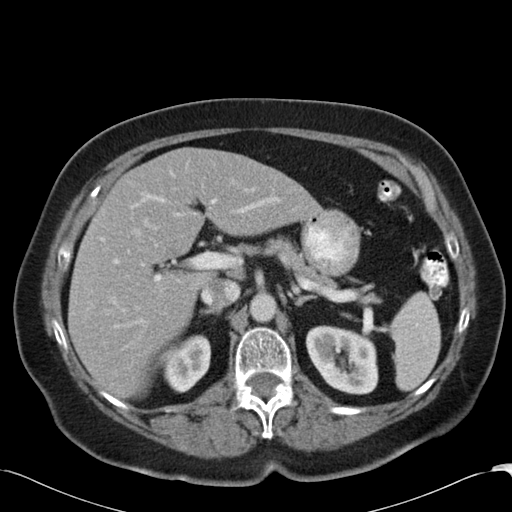
[im 70/89  soft-tissue]
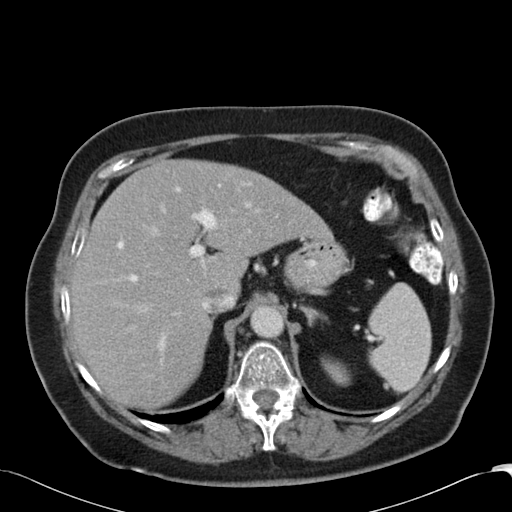
[im 75/89  soft-tissue]
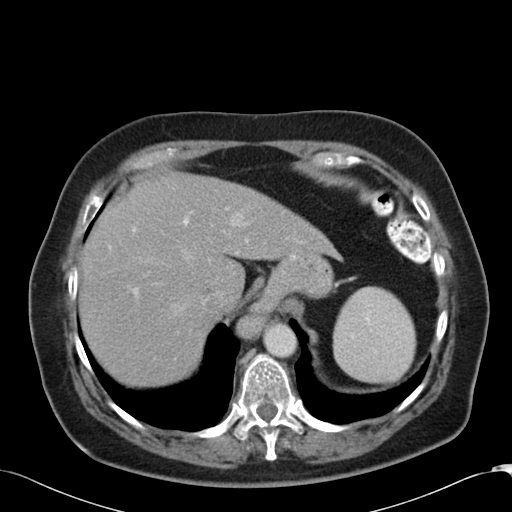
[im 84/89  soft-tissue]
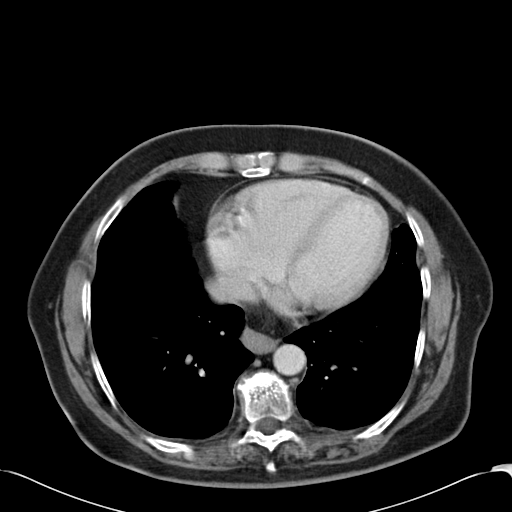

[Series 602: cor · coronal · 0.90mm/px · 3 of 125 slices shown]
[im 42/125  soft-tissue]
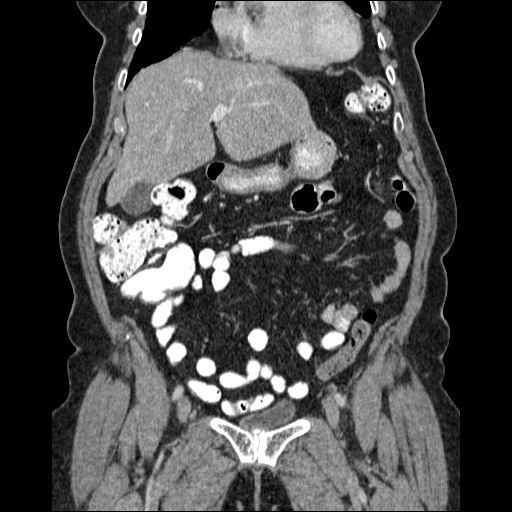
[im 56/125  soft-tissue]
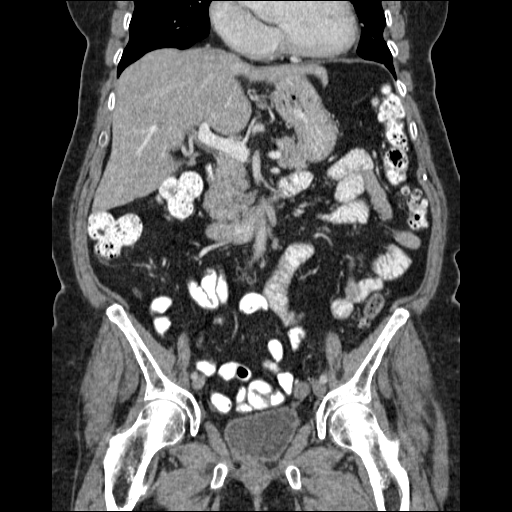
[im 69/125  soft-tissue]
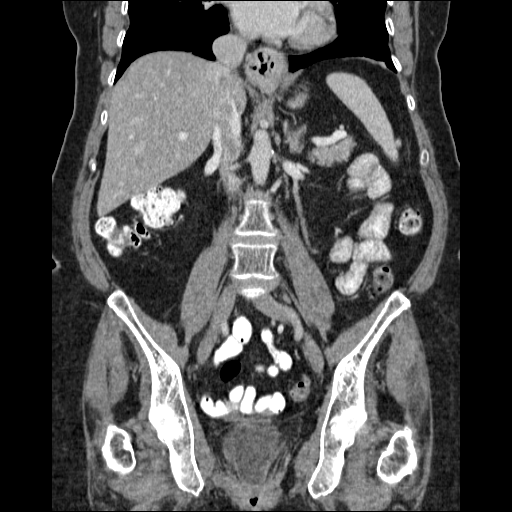

[16 of 46 positions shown; findings below may reference images not displayed]

FINDINGS: The lung bases are clear. The heart is mildly enlarged. A moderate
size hiatal hernia is present. The liver enhances with no focal
abnormality and no ductal dilatation is seen. No calcified
gallstones are noted. The pancreas is normal in size and the
pancreatic duct is not dilated. The adrenal glands and spleen are
unremarkable. The stomach is decompressed. The kidneys enhance with
no calculus or mass and on delayed images, the pelvocaliceal systems
are unremarkable. The proximal ureters are normal in caliber. The
abdominal aorta is normal in caliber. Mild atherosclerotic disease
of the mid distal abdominal aorta is noted.

The urinary bladder is not well distended but no abnormality is
seen. The uterus has previously been resected. There may be a small
cystocele present. There is a minimally prominent lymph node in the
perirectal fat planes to the left midline on image 75 measuring 7
mm, of questionable significance. No colonic lesion is evident.
There are rectosigmoid colon diverticula present but no
diverticulitis is currently seen. Diverticula also are scattered
throughout the descending colon. A moderate amount of feces is noted
in the more proximal colon. The terminal ileum is unremarkable. The
appendix is normal in size. Degenerative changes are noted in the SI
joints with degenerative disc disease present at L5-S1. No
compression deformity is seen.
IMPRESSION: 1. Multiple diverticula in the rectosigmoid and descending colon. No
present evidence of diverticulitis.
2. Moderate-sized hiatal hernia.
3. Minimal prominence of a lymph node in the left perirectal fat
planes of questionable significance.

## 2016-01-04 DIAGNOSIS — Z23 Encounter for immunization: Secondary | ICD-10-CM | POA: Diagnosis not present

## 2016-03-20 DIAGNOSIS — D225 Melanocytic nevi of trunk: Secondary | ICD-10-CM | POA: Diagnosis not present

## 2016-03-20 DIAGNOSIS — L820 Inflamed seborrheic keratosis: Secondary | ICD-10-CM | POA: Diagnosis not present

## 2016-03-30 ENCOUNTER — Telehealth: Payer: Self-pay

## 2016-03-30 ENCOUNTER — Encounter: Payer: Self-pay | Admitting: Internal Medicine

## 2016-03-30 NOTE — Telephone Encounter (Signed)
Started a prior authorization thru covermymeds and then it required I call [employer]  At [phone] and speak with a rep.  The Rabeprazole is not on the formulary.  Her dx is GERD K21.9, and she has tried omeprazole 70m and 473m pantoprazole 4038m They have faxed over a form for Dr GesCarlean Purl state the medical need for having the rabeprazole and we will fax it back to them.

## 2016-04-02 NOTE — Telephone Encounter (Signed)
Dr Celesta Aver letter was faxed to them 03/30/16 and today we got an approval thru 01/28/17, the pharmacy was informed.

## 2016-04-03 DIAGNOSIS — H43393 Other vitreous opacities, bilateral: Secondary | ICD-10-CM | POA: Diagnosis not present

## 2016-04-05 ENCOUNTER — Telehealth: Payer: Self-pay | Admitting: Internal Medicine

## 2016-04-05 NOTE — Telephone Encounter (Signed)
Patient is asking about the efficacy differences in generic medications. She is advised that I am sure there is some minor differences. [facility] has changed her brand of Aciphex and she feels it does not work as well.  She is asked to please call [facility] and ask for the manufacturer of the generic that worked and see if she can find a Management consultant that carries it.  She is asked to let us know if she can't find it and we can change PPI.  She reports that she has tried several different PPIs and this was working well.

## 2016-04-16 ENCOUNTER — Other Ambulatory Visit: Payer: Self-pay | Admitting: Family Medicine

## 2016-04-16 DIAGNOSIS — Z1231 Encounter for screening mammogram for malignant neoplasm of breast: Secondary | ICD-10-CM

## 2016-04-24 NOTE — Progress Notes (Signed)
Cardiology Office Note   Date:  04/25/2016   ID:  Rachel Carlson, DOB 1945-08-09, MRN [id number]  PCP:  Loura Pardon, MD  Cardiologist:   Minus Breeding, MD    Chief Complaint  Patient presents with  . Shortness of Breath      History of Present Illness: Rachel Carlson is a 71 y.o. female who presents for a history of palpitations and PVCs.  She was previously seen by Dr. Aundra Dubin.  She has a family history of sudden cardiac death and CAD.  She had an echo in 2010 which was essentially unremarkable and a Holter that demonstrated PVCs. She has a strong family history as listed elsewhere. She herself does not have any chest discomfort although she might get short of breath with activities. She denies any presyncope or syncope. Does have occasional palpitations which has been evaluated and is stable from her last monitor. She doesn't exercise routinely. She'll do things like vacuuming. With this she might get short of breath but she is not again bringing on any chest pressure, neck or arm discomfort. She's not having any PND or orthopnea.  Past Medical History:  Diagnosis Date  . Blood transfusion without reported diagnosis   . Chronic right ear pain    normal MRI  . Esophageal stricture   . Fibroids    uterine, history of  . GERD (gastroesophageal reflux disease)   . History of uterine prolapse   . Hx of adenomatous polyp of colon 10/19/2014  . Hyperlipidemia   . Obesity   . PVC (premature ventricular contraction)    first dx by holter in 1980's, echo (6/10) EF 44-62%, normal diastolic fxn, normal size RV and fxn, mild MR, PASP 51mHg  . Tubal pregnancy   . 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 BLADDER NECK SUSPENSION  1998  . COLONOSCOPY  05/18/2011   Dr. CSilvano Rusk . ECTOPIC PREGNANCY SURGERY  1980  .  ESOPHAGOGASTRODUODENOSCOPY (EGD) WITH ESOPHAGEAL DILATION  04/03/12   Dr. CSilvano Rusk . PARTIAL HYSTERECTOMY     abdominal, fibroids, and prolapse (1998) bladder tack  . UPPER GASTROINTESTINAL ENDOSCOPY       Current Outpatient Prescriptions  Medication Sig Dispense Refill  . aspirin 81 MG tablet Take 81 mg by mouth daily.      . calcium citrate-vitamin D (CITRACAL+D) 315-200 MG-UNIT per tablet Take 1 tablet by mouth 2 (two) times daily.     . Cholecalciferol (VITAMIN D-3) 5000 UNITS TABS 3,000 Units. 1 tablet by mouth on Monday,Wednesday and Friday    . Coenzyme Q10 (CO Q 10 PO) Take 400 Units by mouth daily.      . COLOCORT 100 MG/60ML enema PLACE ONE ENEMA RECTALLY EVERY DAY AT BEDTIME (Patient not taking: Reported on 10/26/2015) 1680 mL 0  . Cranberry 300 MG tablet Take 300 mg by mouth daily.      . folic acid (FOLVITE) 1 MG tablet Take 1 tablet (1 mg total) by mouth daily. 30 tablet 11  . Glucosamine-Chondroitin (GLUCOSAMINE CHONDR COMPLEX PO) Take 2 capsules by mouth daily.     . Grape Seed Extract 100 MG CAPS Take 1 capsule by mouth daily.     . hydrocortisone (ANUSOL-HC) 2.5 % rectal cream Place 1 application rectally 2 (two) times daily. (Patient not taking: Reported on  10/26/2015) 30 g 1  . hydrocortisone (CORTENEMA) 100 MG/60ML enema Place 1 enema (100 mg total) rectally at bedtime as needed. 30 enema 0  . LIALDA 1.2 g EC tablet Take 2 tablets (2.4 g total) by mouth 2 (two) times daily. (Patient taking differently: Take 1.2 g by mouth 2 (two) times daily. ) 120 tablet 5  . Omega-3 Fatty Acids (FISH OIL TRIPLE STRENGTH) 1400 MG CAPS Take by mouth daily.      . predniSONE (DELTASONE) 10 MG tablet Take 4 tabs for 5 days, 3 tabs for 5 days, 2 tabs for 7 days, 1 tab for 7 days, 1/2 tab for 7 days, stop (Patient not taking: Reported on 10/26/2015) 100 tablet 0  . RABEprazole (ACIPHEX) 20 MG tablet Take 1 tablet (20 mg total) by mouth daily. 90 tablet 3  . raloxifene (EVISTA) 60 MG tablet  Take 1 tablet (60 mg total) by mouth daily. 90 tablet 3  . rosuvastatin (CRESTOR) 20 MG tablet Take 1 tablet (20 mg total) by mouth at bedtime. 90 tablet 3  . VITAMIN B COMPLEX-C CAPS Take 1 capsule by mouth daily.      No current facility-administered medications for this visit.     Allergies:   Pantoprazole sodium; Ranitidine; Omeprazole; and Sulfasalazine  ROS:  Please see the history of present illness.   Otherwise, review of systems are positive for none.   All other systems are reviewed and negative.    PHYSICAL EXAM: VS:  BP 134/64   Pulse 74   Ht 5' 2"  (1.575 m)   Wt 199 lb (90.3 kg)   LMP 05/29/1996   BMI 36.40 kg/m  , BMI Body mass index is 36.4 kg/m. GENERAL:  Well appearing NECK:  No jugular venous distention, waveform within normal limits, carotid upstroke brisk and symmetric, no bruits, no thyromegaly LYMPHATICS:  No cervical, inguinal adenopathy LUNGS:  Clear to auscultation bilaterally BACK:  No CVA tenderness CHEST:  Unremarkable HEART:  PMI not displaced or sustained,S1 and S2 within normal limits, no S3, no S4, no clicks, no rubs, no murmurs ABD:  Flat, positive bowel sounds normal in frequency in pitch, no bruits, no rebound, no guarding, no midline pulsatile mass, no hepatomegaly, no splenomegaly EXT:  2 plus pulses throughout, no edema, no cyanosis no clubbing    EKG:  EKG is not ordered today. The ekg ordered 10/26/15 demonstrates normal sinus rhythm, rate 72, axis within normal limits, intervals within normal limits, no acute ST-T wave changes.    Recent Labs: 07/12/2015: ALT 22; BUN 16; Creatinine, Ser 0.70; Hemoglobin 13.7; Platelets 216.0; Potassium 4.5; Sodium 139; TSH 3.08    Lipid Panel    Component Value Date/Time   CHOL 163 07/12/2015 0848   TRIG 118.0 07/12/2015 0848   HDL 62.80 07/12/2015 0848   CHOLHDL 3 07/12/2015 0848   VLDL 23.6 07/12/2015 0848   LDLCALC 77 07/12/2015 0848   LDLDIRECT 257.3 06/04/2008 1005      Wt Readings from  Last 3 Encounters:  04/25/16 199 lb (90.3 kg)  10/26/15 203 lb (92.1 kg)  10/20/15 203 lb (92.1 kg)      Other studies Reviewed: Additional studies/ records that were reviewed today include: Echo from 2010. Review of the above records demonstrates:  Please see elsewhere in the note.     ASSESSMENT AND PLAN:  DYSPNEA: Given her strong family history this certainly could be an anginal equivalent. I'm going to bring her back for a POET (Plain Old Exercise Treadmill)  OVERWIEGHT:   The patient understands the need to lose weight with diet and exercise. We have discussed specific strategies for this.  DYSLIPIDEMIA:  Her LDL is excellent as above. She'll continue the meds as listed.      Current medicines are reviewed at length with the patient today.  The patient does not have concerns regarding medicines.  The following changes have been made:  no change  Labs/ tests ordered today include:    Orders Placed This Encounter  Procedures  . EXERCISE TOLERANCE TEST     Disposition:   FU with me in one year.     Signed, Minus Breeding, MD  04/25/2016 1:12 PM    [facility]

## 2016-04-25 ENCOUNTER — Encounter: Payer: Self-pay | Admitting: Cardiology

## 2016-04-25 ENCOUNTER — Ambulatory Visit (INDEPENDENT_AMBULATORY_CARE_PROVIDER_SITE_OTHER): Payer: Medicare Other | Admitting: Cardiology

## 2016-04-25 VITALS — BP 134/64 | HR 74 | Ht 62.0 in | Wt 199.0 lb

## 2016-04-25 DIAGNOSIS — E663 Overweight: Secondary | ICD-10-CM

## 2016-04-25 DIAGNOSIS — R0602 Shortness of breath: Secondary | ICD-10-CM | POA: Diagnosis not present

## 2016-04-25 NOTE — Patient Instructions (Signed)
Medication Instructions:  Continue current medications  Labwork: None Ordered  Testing/Procedures: Your physician has requested that you have an exercise tolerance test. For further information please visit [email or web address]. Please also follow instruction sheet, as given.   Follow-Up: Your physician wants you to follow-up in: 1 Year. You will receive a reminder letter in the mail two months in advance. If you don't receive a letter, please call our office to schedule the follow-up appointment.   Any Other Special Instructions Will Be Listed Below (If Applicable).   If you need a refill on your cardiac medications before your next appointment, please call your pharmacy.

## 2016-05-02 ENCOUNTER — Telehealth (HOSPITAL_COMMUNITY): Payer: Self-pay

## 2016-05-02 NOTE — Telephone Encounter (Signed)
Encounter complete. 

## 2016-05-04 ENCOUNTER — Ambulatory Visit (HOSPITAL_COMMUNITY)
Admission: RE | Admit: 2016-05-04 | Discharge: 2016-05-04 | Disposition: A | Discharge status: Home / Self Care | Payer: Medicare Other | Source: Ambulatory Visit | Attending: Cardiology | Admitting: Cardiology

## 2016-05-04 DIAGNOSIS — R0602 Shortness of breath: Secondary | ICD-10-CM | POA: Diagnosis not present

## 2016-05-04 DIAGNOSIS — I493 Ventricular premature depolarization: Secondary | ICD-10-CM | POA: Diagnosis not present

## 2016-05-04 LAB — EXERCISE TOLERANCE TEST
CHL CUP MPHR: 152 {beats}/min
CHL CUP RESTING HR STRESS: 69 {beats}/min
CSEPEDS: 25 s
CSEPPHR: 162 {beats}/min
Estimated workload: 6.2 METS
Exercise duration (min): 4 min
Percent HR: 106 %
RPE: 18

## 2016-05-10 ENCOUNTER — Telehealth: Payer: Self-pay | Admitting: Cardiology

## 2016-05-10 NOTE — Telephone Encounter (Signed)
New message     Pt is calling back regarding her CT results please call

## 2016-05-10 NOTE — Telephone Encounter (Signed)
I have called patient and communicated result findings to her. Pt expressed understanding and thanks. Nya, I don't have access to your results folder -- can you update & send to Dr. Glori Bickers?  She also requested copies of report send to her - I have confirmed her home address.

## 2016-05-28 ENCOUNTER — Ambulatory Visit
Admission: RE | Admit: 2016-05-28 | Discharge: 2016-05-28 | Disposition: A | Discharge status: Home / Self Care | Payer: Medicare Other | Source: Ambulatory Visit | Attending: Family Medicine | Admitting: Family Medicine

## 2016-05-28 DIAGNOSIS — Z1231 Encounter for screening mammogram for malignant neoplasm of breast: Secondary | ICD-10-CM

## 2016-05-29 ENCOUNTER — Other Ambulatory Visit: Payer: Self-pay | Admitting: Family Medicine

## 2016-05-29 DIAGNOSIS — R928 Other abnormal and inconclusive findings on diagnostic imaging of breast: Secondary | ICD-10-CM

## 2016-06-01 ENCOUNTER — Ambulatory Visit
Admission: RE | Admit: 2016-06-01 | Discharge: 2016-06-01 | Disposition: A | Discharge status: Home / Self Care | Payer: Medicare Other | Source: Ambulatory Visit | Attending: Family Medicine | Admitting: Family Medicine

## 2016-06-01 ENCOUNTER — Other Ambulatory Visit: Payer: Self-pay | Admitting: Family Medicine

## 2016-06-01 DIAGNOSIS — R928 Other abnormal and inconclusive findings on diagnostic imaging of breast: Secondary | ICD-10-CM | POA: Diagnosis not present

## 2016-06-01 DIAGNOSIS — N6320 Unspecified lump in the left breast, unspecified quadrant: Secondary | ICD-10-CM

## 2016-06-01 DIAGNOSIS — N6489 Other specified disorders of breast: Secondary | ICD-10-CM | POA: Diagnosis not present

## 2016-06-13 ENCOUNTER — Other Ambulatory Visit: Payer: Self-pay | Admitting: Family Medicine

## 2016-06-13 ENCOUNTER — Ambulatory Visit
Admission: RE | Admit: 2016-06-13 | Discharge: 2016-06-13 | Disposition: A | Discharge status: Home / Self Care | Payer: Medicare Other | Source: Ambulatory Visit | Attending: Family Medicine | Admitting: Family Medicine

## 2016-06-13 DIAGNOSIS — R928 Other abnormal and inconclusive findings on diagnostic imaging of breast: Secondary | ICD-10-CM

## 2016-06-13 DIAGNOSIS — N6320 Unspecified lump in the left breast, unspecified quadrant: Secondary | ICD-10-CM

## 2016-06-13 DIAGNOSIS — N6323 Unspecified lump in the left breast, lower outer quadrant: Secondary | ICD-10-CM | POA: Diagnosis not present

## 2016-06-13 DIAGNOSIS — N6012 Diffuse cystic mastopathy of left breast: Secondary | ICD-10-CM | POA: Diagnosis not present

## 2016-06-27 ENCOUNTER — Telehealth: Payer: Self-pay | Admitting: Cardiology

## 2016-06-27 NOTE — Telephone Encounter (Signed)
New message    Pt is calling stating when she had her stress test a few months ago, she asked for the results to be mailed. She said she has never received it.

## 2016-06-27 NOTE — Telephone Encounter (Signed)
Spoke with pt, report printed and placed in the mail to her confirmed address.

## 2016-07-02 ENCOUNTER — Telehealth: Payer: Self-pay | Admitting: Family Medicine

## 2016-07-02 DIAGNOSIS — Z0000 Encounter for general adult medical examination without abnormal findings: Secondary | ICD-10-CM

## 2016-07-02 DIAGNOSIS — R739 Hyperglycemia, unspecified: Secondary | ICD-10-CM

## 2016-07-02 DIAGNOSIS — E7800 Pure hypercholesterolemia, unspecified: Secondary | ICD-10-CM

## 2016-07-02 DIAGNOSIS — E559 Vitamin D deficiency, unspecified: Secondary | ICD-10-CM

## 2016-07-02 NOTE — Telephone Encounter (Signed)
-----   Message from Ellamae Sia sent at 07/02/2016  2:37 PM EDT ----- Regarding: Lab orders for Monday, 6.11.18 Patient is scheduled for CPX labs, please order future labs, Thanks , Karna Christmas

## 2016-07-09 ENCOUNTER — Other Ambulatory Visit (INDEPENDENT_AMBULATORY_CARE_PROVIDER_SITE_OTHER): Payer: Medicare Other

## 2016-07-09 ENCOUNTER — Other Ambulatory Visit: Payer: Medicare Other

## 2016-07-09 DIAGNOSIS — E559 Vitamin D deficiency, unspecified: Secondary | ICD-10-CM

## 2016-07-09 DIAGNOSIS — E7800 Pure hypercholesterolemia, unspecified: Secondary | ICD-10-CM

## 2016-07-09 DIAGNOSIS — R739 Hyperglycemia, unspecified: Secondary | ICD-10-CM

## 2016-07-09 LAB — LIPID PANEL
CHOL/HDL RATIO: 3
Cholesterol: 162 mg/dL (ref 0–200)
HDL: 58.1 mg/dL (ref >39.00)
LDL Cholesterol: 73 mg/dL (ref 0–99)
NONHDL: 104.11
TRIGLYCERIDES: 156 mg/dL — AB (ref 0.0–149.0)
VLDL: 31.2 mg/dL (ref 0.0–40.0)

## 2016-07-09 LAB — COMPREHENSIVE METABOLIC PANEL
ALK PHOS: 52 U/L (ref 39–117)
ALT: 23 U/L (ref 0–35)
AST: 18 U/L (ref 0–37)
Albumin: 4.3 g/dL (ref 3.5–5.2)
BUN: 11 mg/dL (ref 6–23)
CHLORIDE: 105 meq/L (ref 96–112)
CO2: 26 meq/L (ref 19–32)
Calcium: 9.9 mg/dL (ref 8.4–10.5)
Creatinine, Ser: 0.75 mg/dL (ref 0.40–1.20)
GFR: 81.54 mL/min (ref >60.00)
GLUCOSE: 108 mg/dL — AB (ref 70–99)
POTASSIUM: 4.7 meq/L (ref 3.5–5.1)
SODIUM: 140 meq/L (ref 135–145)
TOTAL PROTEIN: 6.9 g/dL (ref 6.0–8.3)
Total Bilirubin: 0.5 mg/dL (ref 0.2–1.2)

## 2016-07-09 LAB — CBC WITH DIFFERENTIAL/PLATELET
Basophils Absolute: 0 10*3/uL (ref 0.0–0.1)
Basophils Relative: 0.9 % (ref 0.0–3.0)
Eosinophils Absolute: 0.1 10*3/uL (ref 0.0–0.7)
Eosinophils Relative: 1 % (ref 0.0–5.0)
HCT: 41.7 % (ref 36.0–46.0)
Hemoglobin: 14.1 g/dL (ref 12.0–15.0)
LYMPHS ABS: 1.5 10*3/uL (ref 0.7–4.0)
Lymphocytes Relative: 28.8 % (ref 12.0–46.0)
MCHC: 33.8 g/dL (ref 30.0–36.0)
MCV: 89.2 fl (ref 78.0–100.0)
MONO ABS: 0.4 10*3/uL (ref 0.1–1.0)
Monocytes Relative: 8.2 % (ref 3.0–12.0)
NEUTROS ABS: 3.2 10*3/uL (ref 1.4–7.7)
NEUTROS PCT: 61.1 % (ref 43.0–77.0)
PLATELETS: 218 10*3/uL (ref 150.0–400.0)
RBC: 4.68 Mil/uL (ref 3.87–5.11)
RDW: 12.8 % (ref 11.5–15.5)
WBC: 5.3 10*3/uL (ref 4.0–10.5)

## 2016-07-09 LAB — VITAMIN D 25 HYDROXY (VIT D DEFICIENCY, FRACTURES): VITD: 37.11 ng/mL (ref 30.00–100.00)

## 2016-07-09 LAB — HEMOGLOBIN A1C: Hgb A1c MFr Bld: 6.1 % (ref 4.6–6.5)

## 2016-07-09 LAB — TSH: TSH: 2.58 u[IU]/mL (ref 0.35–4.50)

## 2016-07-11 ENCOUNTER — Ambulatory Visit (INDEPENDENT_AMBULATORY_CARE_PROVIDER_SITE_OTHER): Payer: Medicare Other | Admitting: Family Medicine

## 2016-07-11 ENCOUNTER — Encounter: Payer: Self-pay | Admitting: Family Medicine

## 2016-07-11 VITALS — BP 108/72 | HR 71 | Temp 98.6°F | Ht 62.25 in | Wt 199.5 lb

## 2016-07-11 DIAGNOSIS — E538 Deficiency of other specified B group vitamins: Secondary | ICD-10-CM | POA: Diagnosis not present

## 2016-07-11 DIAGNOSIS — G63 Polyneuropathy in diseases classified elsewhere: Secondary | ICD-10-CM | POA: Diagnosis not present

## 2016-07-11 DIAGNOSIS — M810 Age-related osteoporosis without current pathological fracture: Secondary | ICD-10-CM | POA: Diagnosis not present

## 2016-07-11 DIAGNOSIS — R739 Hyperglycemia, unspecified: Secondary | ICD-10-CM | POA: Diagnosis not present

## 2016-07-11 DIAGNOSIS — K51318 Ulcerative (chronic) rectosigmoiditis with other complication: Secondary | ICD-10-CM

## 2016-07-11 DIAGNOSIS — Z6836 Body mass index (BMI) 36.0-36.9, adult: Secondary | ICD-10-CM

## 2016-07-11 DIAGNOSIS — E7800 Pure hypercholesterolemia, unspecified: Secondary | ICD-10-CM

## 2016-07-11 DIAGNOSIS — E559 Vitamin D deficiency, unspecified: Secondary | ICD-10-CM

## 2016-07-11 DIAGNOSIS — Z86010 Personal history of colonic polyps: Secondary | ICD-10-CM

## 2016-07-11 MED ORDER — ROSUVASTATIN CALCIUM 20 MG PO TABS: 20.0000 mg | ORAL_TABLET | Freq: Every day | ORAL | 3 refills | Status: DC

## 2016-07-11 MED ORDER — RALOXIFENE HCL 60 MG PO TABS: 60.0000 mg | ORAL_TABLET | Freq: Every day | ORAL | 3 refills | Status: DC

## 2016-07-11 NOTE — Patient Instructions (Addendum)
If you are interested in the new shingles vaccine (Shingrix) - call your insurance to check on coverage,( you should not get it within 1 month of other vaccines) , then call us for a prescription  for it to take to a pharmacy that gives the shot , or make a nurse visit to get it here depending on your coverage   Take your vitamin D every day   You are pre diabetic  Watch out for sweets and simple carbohydrates  Exercise also helps   Take care of yourself !

## 2016-07-11 NOTE — Progress Notes (Signed)
Subjective:    Patient ID: Rachel Carlson, female    DOB: 07-17-1945, 71 y.o.   MRN: [id number]  HPI Here for annual f/u of chronic medical problems   Feeling ok / summer routine  No complaints - can get up and get around  Wants to work on strength and muscle tone  She does house and yard work   She walks occasionally  She likes to get outside -when she can    Needs to schedule AMW visit - unsure if she wants to do this   Wt Readings from Last 3 Encounters:  07/11/16 199 lb 8 oz (90.5 kg)  04/25/16 199 lb (90.3 kg)  10/26/15 203 lb (92.1 kg)  down 4 lb since the fall - lost a bit more and then back tracked  Diet is fair - she does not eat out  bmi 36.2  Tetanus shot Tdap in October   Zoster status -zostavax 5/11 She would be interested in shingrix when it is available in the future if covered   Mammogram 4/18- breast lump noted on the L and she had a bx (benign fibrocystic change) Self breast exam-no lumps or changes  Has had a partial hysterectomy  Colonoscopy 9/16- recall 2021  She has ulcerative proctosigmoiditis - and it is calm right now (sees Dr Carlean Purl)- on Lialda fam hx of colon cancer in brother   dexa 4/17- osteopenia improved from 2015 D level is 37.1  Taking evista - she takes it at night (makes her hot)  On ppi    Hx of hyperglyemia Lab Results  Component Value Date   HGBA1C 6.1 07/09/2016  she needs to be more careful with sweets and carbs   Hx of hyperlipidemia Lab Results  Component Value Date   CHOL 162 07/09/2016   CHOL 163 07/12/2015   CHOL 160 07/05/2014   Lab Results  Component Value Date   HDL 58.10 07/09/2016   HDL 62.80 07/12/2015   HDL 54.80 07/05/2014   Lab Results  Component Value Date   LDLCALC 73 07/09/2016   LDLCALC 77 07/12/2015   LDLCALC 81 07/05/2014   Lab Results  Component Value Date   TRIG 156.0 (H) 07/09/2016   TRIG 118.0 07/12/2015   TRIG 120.0 07/05/2014   Lab Results  Component Value Date   CHOLHDL 3 07/09/2016   CHOLHDL 3 07/12/2015   CHOLHDL 3 07/05/2014   Lab Results  Component Value Date   LDLDIRECT 257.3 06/04/2008   LDLDIRECT 203.9 06/04/2007   crestor and diet- good profile  Has fam hx of CAD  Lab Results  Component Value Date   WBC 5.3 07/09/2016   HGB 14.1 07/09/2016   HCT 41.7 07/09/2016   MCV 89.2 07/09/2016   PLT 218.0 07/09/2016     Chemistry      Component Value Date/Time   NA 140 07/09/2016 0910   K 4.7 07/09/2016 0910   CL 105 07/09/2016 0910   CO2 26 07/09/2016 0910   BUN 11 07/09/2016 0910   CREATININE 0.75 07/09/2016 0910      Component Value Date/Time   CALCIUM 9.9 07/09/2016 0910   ALKPHOS 52 07/09/2016 0910   AST 18 07/09/2016 0910   ALT 23 07/09/2016 0910   BILITOT 0.5 07/09/2016 0910      Lab Results  Component Value Date   TSH 2.58 07/09/2016    Patient Active Problem List   Diagnosis Date Noted  . Hyperglycemia 07/20/2015  . Estrogen deficiency 04/27/2015  .  GERD (gastroesophageal reflux disease) 03/23/2015  . Hx of adenomatous polyp of colon 10/19/2014  . Ulcerative proctosigmoiditis ([place]) 10/12/2014  . Pain in joint, pelvic region and thigh 07/24/2014  . Need for hepatitis C screening test 07/12/2014  . Obesity 07/12/2014  . Pelvic pain in female 07/12/2014  . Vitamin D deficiency 07/03/2014  . Encounter for Medicare annual wellness exam 07/08/2013  . Lower abdominal pain 07/08/2013  . Osteoporosis 05/24/2013  . 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 Adnexal pain 11/15/2010  . Urge incontinence 11/15/2010  . Family history of coronary artery disease 10/15/2010  . Routine general medical examination at a health care facility 06/01/2010  . [place], [place] 06/06/2009  . Hyperlipidemia 06/03/2007  . Urge urinary incontinence 06/03/2007  . MIGRAINES, HX OF 06/03/2007   Past Medical History:  Diagnosis Date  . Blood transfusion without reported diagnosis   .  Chronic right ear pain    normal MRI  . Esophageal stricture   . Fibroids    uterine, history of  . GERD (gastroesophageal reflux disease)   . History of uterine prolapse   . Hx of adenomatous polyp of colon 10/19/2014  . Hyperlipidemia   . Obesity   . PVC (premature ventricular contraction)    first dx by holter in 1980's, echo (6/10) EF 88-50%, normal diastolic fxn, normal size RV and fxn, mild MR, PASP 63mHg  . Tubal pregnancy   . [place]os[place]   . Urge urinary incontinence 06/03/2007   Qualifier: Diagnosis of  By: BMarcelino ScotCMA, AAuburn Bilberry    Past Surgical History:  Procedure Laterality Date  . BLADDER NECK SUSPENSION  1998  . COLONOSCOPY  05/18/2011   Dr. CSilvano Rusk . ECTOPIC PREGNANCY SURGERY  1980  . ESOPHAGOGASTRODUODENOSCOPY (EGD) WITH ESOPHAGEAL DILATION  04/03/12   Dr. CSilvano Rusk . PARTIAL HYSTERECTOMY     abdominal, fibroids, and prolapse (1998) bladder tack  . UPPER GASTROINTESTINAL ENDOSCOPY     Social History  Substance Use Topics  . Smoking status: Never Smoker  . Smokeless tobacco: Never Used  . Alcohol use No     Comment: rarely beer   Family History  Problem Relation Age of Onset  . Sudden death Father 441      "coronary arteriosclerosis" on death certificate  . Heart attack Mother 722 . Transient ischemic attack Mother   . Diabetes type II Mother   . Sudden death Mother        died age 69 . Diabetes Mother   . Coronary artery disease Mother   . Arrhythmia Brother   . Colon cancer Brother        40's  . Breast cancer Sister   . Colon polyps Sister        adenomatous  . Other Son        pituitary tumor    Allergies  Allergen Reactions  . Pantoprazole Sodium     Abdominal pain, diarrhea  . Ranitidine     Diarrhea, abdominal pain  . Omeprazole Diarrhea    Abdominal pain also   . Sulfasalazine Rash    Fever, chills, headache, muscle pain   Current Outpatient Prescriptions on File Prior to Visit  Medication Sig  Dispense Refill  . aspirin 81 MG tablet Take 81 mg by mouth daily.      . calcium citrate-vitamin D (CITRACAL+D) 315-200 MG-UNIT per tablet Take 1 tablet by mouth 2 (  two) times daily.     . Cholecalciferol (VITAMIN D-3) 5000 UNITS TABS 3,000 Units. 1 tablet by mouth on Monday,Wednesday and Friday    . Coenzyme Q10 (CO Q 10 PO) Take 400 Units by mouth daily.      . COLOCORT 100 MG/60ML enema PLACE ONE ENEMA RECTALLY EVERY DAY AT BEDTIME 1680 mL 0  . Cranberry 300 MG tablet Take 300 mg by mouth daily.      . folic acid (FOLVITE) 1 MG tablet Take 1 tablet (1 mg total) by mouth daily. 30 tablet 11  . Glucosamine-Chondroitin (GLUCOSAMINE CHONDR COMPLEX PO) Take 2 capsules by mouth daily.     . Grape Seed Extract 100 MG CAPS Take 1 capsule by mouth daily.     . hydrocortisone (ANUSOL-HC) 2.5 % rectal cream Place 1 application rectally 2 (two) times daily. 30 g 1  . hydrocortisone (CORTENEMA) 100 MG/60ML enema Place 1 enema (100 mg total) rectally at bedtime as needed. 30 enema 0  . LIALDA 1.2 g EC tablet Take 2 tablets (2.4 g total) by mouth 2 (two) times daily. (Patient taking differently: Take 1.2 g by mouth 2 (two) times daily. ) 120 tablet 5  . Omega-3 Fatty Acids (FISH OIL TRIPLE STRENGTH) 1400 MG CAPS Take by mouth daily.      . RABEprazole (ACIPHEX) 20 MG tablet Take 1 tablet (20 mg total) by mouth daily. 90 tablet 3  . VITAMIN B COMPLEX-C CAPS Take 1 capsule by mouth daily.      No current facility-administered medications on file prior to visit.     Review of Systems    Review of Systems  Constitutional: Negative for fever, appetite change, fatigue and unexpected weight change.  Eyes: Negative for pain and visual disturbance.  Respiratory: Negative for cough and shortness of breath.   Cardiovascular: Negative for cp or palpitations    Gastrointestinal: Negative for nausea, diarrhea and constipation. neg for blood in stool lately Genitourinary: Negative for urgency and frequency.  Skin:  Negative for pallor or rash   Neurological: Negative for weakness, light-headedness, numbness and headaches.  Hematological: Negative for adenopathy. Does not bruise/bleed easily.  Psychiatric/Behavioral: Negative for dysphoric mood. The patient is not nervous/anxious.      Objective:   Physical Exam  Constitutional: She appears well-developed and well-nourished. No distress.  obese and well appearing   HENT:  Head: Normocephalic and atraumatic.  Right Ear: External ear normal.  Left Ear: External ear normal.  Mouth/Throat: Oropharynx is clear and moist.  Eyes: Conjunctivae and EOM are normal. Pupils are equal, round, and reactive to light. No scleral icterus.  Neck: Normal range of motion. Neck supple. No JVD present. Carotid bruit is not present. No thyromegaly present.  Cardiovascular: Normal rate, regular rhythm, normal heart sounds and intact distal pulses.  Exam reveals no gallop.   Varicose veins of legs/ankles w/o skin change  Pulmonary/Chest: Effort normal and breath sounds normal. No respiratory distress. She has no wheezes. She exhibits no tenderness.  Abdominal: Soft. Bowel sounds are normal. She exhibits no distension, no abdominal bruit and no mass. There is no tenderness.  Genitourinary: No breast swelling, tenderness, discharge or bleeding.  Genitourinary Comments: Breast exam: No mass, nodules, thickening, tenderness, bulging, retraction, inflamation, nipple discharge or skin changes noted.  No axillary or clavicular LA.      Musculoskeletal: Normal range of motion. She exhibits no edema or tenderness.  No kyphosis   Lymphadenopathy:    She has no cervical adenopathy.  Neurological: She is alert. She has normal reflexes. No cranial nerve deficit. She exhibits normal muscle tone. Coordination normal.  Skin: Skin is warm and dry. No rash noted. No erythema. No pallor.  Solar lentigines diffusely  Few sks  Psychiatric: She has a normal mood and affect.            Assessment & Plan:   Problem List Items Addressed This Visit      Digestive   Ulcerative proctosigmoiditis ([place]) - Primary    Doing fairly well on Lialda/ under care of GI        Musculoskeletal and Integument   Osteoporosis    dexa 4/17 -improved osteopenia Tolerating evista Disc need for calcium/ vitamin D/ wt bearing exercise and bone density test every 2 y to monitor Disc safety/ fracture risk in detail        Relevant Medications   raloxifene (EVISTA) 60 MG tablet     Other   Hx of adenomatous polyp of colon    Recall colonoscopy 2021      Hyperglycemia    Lab Results  Component Value Date   HGBA1C 6.1 07/09/2016   disc imp of low glycemic diet and wt loss to prevent DM2       Hyperlipidemia    Disc goals for lipids and reasons to control them Rev labs with pt Rev low sat fat diet in detail Good control with crestor and diet        Relevant Medications   rosuvastatin (CRESTOR) 20 MG tablet   Obesity    Discussed how this problem influences overall health and the risks it imposes  Reviewed plan for weight loss with lower calorie diet (via better food choices and also portion control or program like weight watchers) and exercise building up to or more than 30 minutes 5 days per week including some aerobic activity         Vitamin D deficiency    Vitamin D level is therapeutic with current supplementation Disc importance of this to bone and overall health Down into the 30s Stressed imp re: compliance with daily D dosing

## 2016-07-12 DIAGNOSIS — E538 Deficiency of other specified B group vitamins: Secondary | ICD-10-CM | POA: Insufficient documentation

## 2016-07-12 DIAGNOSIS — G63 Polyneuropathy in diseases classified elsewhere: Secondary | ICD-10-CM

## 2016-07-12 NOTE — Assessment & Plan Note (Signed)
Recall colonoscopy 2021

## 2016-07-12 NOTE — Assessment & Plan Note (Signed)
Disc goals for lipids and reasons to control them Rev labs with pt Rev low sat fat diet in detail Good control with crestor and diet

## 2016-07-12 NOTE — Assessment & Plan Note (Signed)
Lab Results  Component Value Date   HGBA1C 6.1 07/09/2016   disc imp of low glycemic diet and wt loss to prevent DM2

## 2016-07-12 NOTE — Assessment & Plan Note (Signed)
Vitamin D level is therapeutic with current supplementation Disc importance of this to bone and overall health Down into the 30s Stressed imp re: compliance with daily D dosing

## 2016-07-12 NOTE — Assessment & Plan Note (Signed)
Doing fairly well on Lialda/ under care of GI

## 2016-07-12 NOTE — Assessment & Plan Note (Signed)
Discussed how this problem influences overall health and the risks it imposes  Reviewed plan for weight loss with lower calorie diet (via better food choices and also portion control or program like weight watchers) and exercise building up to or more than 30 minutes 5 days per week including some aerobic activity    

## 2016-07-12 NOTE — Assessment & Plan Note (Signed)
Not bothering pt currently

## 2016-07-12 NOTE — Assessment & Plan Note (Signed)
dexa 4/17 -improved osteopenia Tolerating evista Disc need for calcium/ vitamin D/ wt bearing exercise and bone density test every 2 y to monitor Disc safety/ fracture risk in detail

## 2016-10-30 DIAGNOSIS — Z23 Encounter for immunization: Secondary | ICD-10-CM | POA: Diagnosis not present

## 2016-10-31 ENCOUNTER — Other Ambulatory Visit: Payer: Self-pay | Admitting: Family Medicine

## 2016-10-31 DIAGNOSIS — N6012 Diffuse cystic mastopathy of left breast: Secondary | ICD-10-CM

## 2016-11-01 ENCOUNTER — Telehealth: Payer: Self-pay

## 2016-11-01 NOTE — Telephone Encounter (Signed)
Flu vaccine documented.

## 2016-11-09 ENCOUNTER — Encounter: Payer: Self-pay | Admitting: Internal Medicine

## 2016-11-09 ENCOUNTER — Ambulatory Visit (INDEPENDENT_AMBULATORY_CARE_PROVIDER_SITE_OTHER): Payer: Medicare Other | Admitting: Internal Medicine

## 2016-11-09 VITALS — BP 128/70 | HR 96 | Ht 62.5 in | Wt 200.2 lb

## 2016-11-09 DIAGNOSIS — K222 Esophageal obstruction: Secondary | ICD-10-CM

## 2016-11-09 DIAGNOSIS — K5130 Ulcerative (chronic) rectosigmoiditis without complications: Secondary | ICD-10-CM

## 2016-11-09 DIAGNOSIS — K6289 Other specified diseases of anus and rectum: Secondary | ICD-10-CM

## 2016-11-09 DIAGNOSIS — K219 Gastro-esophageal reflux disease without esophagitis: Secondary | ICD-10-CM | POA: Diagnosis not present

## 2016-11-09 MED ORDER — RABEPRAZOLE SODIUM 20 MG PO TBEC: 20.0000 mg | DELAYED_RELEASE_TABLET | Freq: Two times a day (BID) | ORAL | 3 refills | Status: DC

## 2016-11-09 MED ORDER — RABEPRAZOLE SODIUM 20 MG PO TBEC: 20.0000 mg | DELAYED_RELEASE_TABLET | Freq: Every day | ORAL | 3 refills | Status: DC

## 2016-11-09 MED ORDER — ZOSTER VAC RECOMB ADJUVANTED 50 MCG/0.5ML IM SUSR: 0.5000 mL | Freq: Once | INTRAMUSCULAR | 1 refills | Status: AC

## 2016-11-09 MED ORDER — ZOSTER VAC RECOMB ADJUVANTED 50 MCG/0.5ML IM SUSR: 0.5000 mL | Freq: Once | INTRAMUSCULAR | 1 refills | Status: DC

## 2016-11-09 NOTE — Progress Notes (Signed)
Rachel Carlson 71 y.o. 1945-09-08 [id number]  Assessment & Plan:   Encounter Diagnoses  Name Primary?  . GERD with stricture   . Ulcerative rectosigmoiditis without complication ([place])   . Rectal pain Yes   Overall she seems to be doing reasonably well but having breakthrough symptoms including dysphagia with respect to GERD. This is worse on generic rabeprazole. We'll try to advance to twice a day 20 mg rabeprazole. Anticipate prior authorization. I recommended she have a dilation because she's having dysphagia she does not want to do so yet. Reemphasize lifestyle changes for GERD handout provided  5% lidocaine topically for rectal pain.  Continue Lialda for ulcerative rectosigmoiditis.  I have prescribed shingles vaccine, she can see if that's available for her at this time though it might be_.  I appreciate the opportunity to care for this patient. CC: Tower, Wynelle Fanny, MD   Subjective:   Chief Complaint:Reflux  HPI The patient is here for follow-up she has reflux with history of stricture and hiatal hernia status post dilation. She says generic AcipHex is not working as well as the name brand. She's having some heartburn at times using antacids or Tums, she cannot use magnesium related to Crestor usage she says. Also intermittent solid food dysphagia but she has noted to regurgitate anything so she does not want to pursue a dilation yet. Lialda taken 2.4 g total dose daily is controlling her colitis though she has occasional episodes of rectal pain she notices it when she's vacuuming or doing something exertional in the house. She is getting ready to choose a new Medicare part D program. Allergies  Allergen Reactions  . Pantoprazole Sodium     Abdominal pain, diarrhea  . Ranitidine     Diarrhea, abdominal pain  . Omeprazole Diarrhea    Abdominal pain also   . Sulfasalazine Rash    Fever, chills, headache, muscle pain   Current Meds  Medication Sig  . aspirin  81 MG tablet Take 81 mg by mouth daily.    . calcium citrate-vitamin D (CITRACAL+D) 315-200 MG-UNIT per tablet Take 1 tablet by mouth 2 (two) times daily.   . Cholecalciferol (VITAMIN D-3) 5000 UNITS TABS 3,000 Units. 1 tablet by mouth on Monday,Wednesday and Friday  . Coenzyme Q10 (CO Q 10 PO) Take 400 Units by mouth daily.    . COLOCORT 100 MG/60ML enema PLACE ONE ENEMA RECTALLY EVERY DAY AT BEDTIME  . Cranberry 300 MG tablet Take 300 mg by mouth daily.    . folic acid (FOLVITE) 1 MG tablet Take 1 tablet (1 mg total) by mouth daily.  . Glucosamine-Chondroitin (GLUCOSAMINE CHONDR COMPLEX PO) Take 2 capsules by mouth daily.   . Grape Seed Extract 100 MG CAPS Take 1 capsule by mouth daily.   . hydrocortisone (ANUSOL-HC) 2.5 % rectal cream Place 1 application rectally 2 (two) times daily.  . hydrocortisone (CORTENEMA) 100 MG/60ML enema Place 1 enema (100 mg total) rectally at bedtime as needed.  Marland Kitchen LIALDA 1.2 g EC tablet Take 2 tablets (2.4 g total) by mouth 2 (two) times daily. (Patient taking differently: Take 1.2 g by mouth 2 (two) times daily. )  . Omega-3 Fatty Acids (FISH OIL TRIPLE STRENGTH) 1400 MG CAPS Take by mouth daily.    . RABEprazole (ACIPHEX) 20 MG tablet Take 1 tablet (20 mg total) by mouth daily.  . raloxifene (EVISTA) 60 MG tablet Take 1 tablet (60 mg total) by mouth daily.  . rosuvastatin (CRESTOR) 20 MG tablet  Take 1 tablet (20 mg total) by mouth at bedtime.  Marland Kitchen VITAMIN B COMPLEX-C CAPS Take 1 capsule by mouth daily.    Past Medical History:  Diagnosis Date  . Blood transfusion without reported diagnosis   . Chronic right ear pain    normal MRI  . Esophageal stricture   . Fibroids    uterine, history of  . GERD (gastroesophageal reflux disease)   . History of uterine prolapse   . Hx of adenomatous polyp of colon 10/19/2014  . Hyperlipidemia   . Obesity   . PVC (premature ventricular contraction)    first dx by holter in 1980's, echo (6/10) EF 29-93%, normal diastolic  fxn, normal size RV and fxn, mild MR, PASP 65mHg  . Tubal pregnancy   . Ulcerative pro[place]igmoiditis ([place]   . Urge urinary incontinence 06/03/2007   Qualifier: Diagnosis of  By: BMarcelino ScotCMA, AAuburn Bilberry    Past Surgical History:  Procedure Laterality Date  . BLADDER NECK SUSPENSION  1998  . COLONOSCOPY  05/18/2011   Dr. CSilvano Rusk . ECTOPIC PREGNANCY SURGERY  1980  . ESOPHAGOGASTRODUODENOSCOPY (EGD) WITH ESOPHAGEAL DILATION  04/03/12   Dr. CSilvano Rusk . PARTIAL HYSTERECTOMY     abdominal, fibroids, and prolapse (1998) bladder tack  . UPPER GASTROINTESTINAL ENDOSCOPY     Review of Systems As per history of present illness  Objective:   Physical Exam BP 128/70   Pulse 96   Ht 5' 2.5" (1.588 m)   Wt 200 lb 3.2 oz (90.8 kg)   LMP 05/29/1996   SpO2 99%   BMI 36.03 kg/m  No acute distress Eyes anicteric  Pam Peterman CMA present Rectal exam reveals NL rectal exam nontender no mass  15 minutes time spent with patient > half in counseling coordination of care

## 2016-11-09 NOTE — Assessment & Plan Note (Signed)
Doing well 

## 2016-11-09 NOTE — Patient Instructions (Addendum)
  We have sent the following medications to your pharmacy for you to pick up at your convenience:  Rabeprazole, and the shingles vaccine , reprinted them and she took hard copies with her.   Take Zantac or Pepcid Complete as needed.   Purchase over the counter Recticare or the store brand and use as needed for rectal pain.   Today we are giving you a GERD life style sheet to read.   I appreciate the opportunity to care for you. Silvano Rusk, MD, [facility]

## 2016-11-09 NOTE — Assessment & Plan Note (Signed)
rabeprazole (generic) not working

## 2016-12-04 ENCOUNTER — Ambulatory Visit
Admission: RE | Admit: 2016-12-04 | Discharge: 2016-12-04 | Disposition: A | Discharge status: Home / Self Care | Payer: Medicare Other | Source: Ambulatory Visit | Attending: Family Medicine | Admitting: Family Medicine

## 2016-12-04 ENCOUNTER — Ambulatory Visit: Payer: Medicare Other

## 2016-12-04 DIAGNOSIS — N6012 Diffuse cystic mastopathy of left breast: Secondary | ICD-10-CM

## 2016-12-04 DIAGNOSIS — R928 Other abnormal and inconclusive findings on diagnostic imaging of breast: Secondary | ICD-10-CM | POA: Diagnosis not present

## 2017-01-14 ENCOUNTER — Telehealth: Payer: Self-pay

## 2017-01-14 NOTE — Telephone Encounter (Signed)
Shingrix vaccine was given at the [place] , documentation sent to be scanned into epic. Documented under immunizations.

## 2017-02-26 DIAGNOSIS — L820 Inflamed seborrheic keratosis: Secondary | ICD-10-CM | POA: Diagnosis not present

## 2017-02-26 DIAGNOSIS — B070 Plantar wart: Secondary | ICD-10-CM | POA: Diagnosis not present

## 2017-02-26 DIAGNOSIS — L738 Other specified follicular disorders: Secondary | ICD-10-CM | POA: Diagnosis not present

## 2017-02-26 DIAGNOSIS — L570 Actinic keratosis: Secondary | ICD-10-CM | POA: Diagnosis not present

## 2017-03-14 ENCOUNTER — Other Ambulatory Visit: Payer: Self-pay | Admitting: Internal Medicine

## 2017-03-15 ENCOUNTER — Telehealth: Payer: Self-pay | Admitting: *Deleted

## 2017-03-15 ENCOUNTER — Other Ambulatory Visit: Payer: Self-pay | Admitting: Family Medicine

## 2017-03-15 DIAGNOSIS — Z139 Encounter for screening, unspecified: Secondary | ICD-10-CM

## 2017-03-15 DIAGNOSIS — E2839 Other primary ovarian failure: Secondary | ICD-10-CM

## 2017-03-15 NOTE — Telephone Encounter (Signed)
It will need to be done 2 or more years after the last one so that will be after 4/27  I will order and send to [facility]

## 2017-03-15 NOTE — Telephone Encounter (Signed)
Patient will call [facility] herself to get Bone Density done on same day as her MMG 05/29/17.

## 2017-03-15 NOTE — Telephone Encounter (Signed)
Copied from [place]. Topic: Referral - Request >> Mar 15, 2017 11:57 AM Ether Griffins B wrote: Reason for CRM: pt requesting a referral for a bone density last one was on 05/26/15. Pt would like to have this done at [facility] on [address]

## 2017-03-28 DIAGNOSIS — B078 Other viral warts: Secondary | ICD-10-CM | POA: Diagnosis not present

## 2017-03-28 DIAGNOSIS — L570 Actinic keratosis: Secondary | ICD-10-CM | POA: Diagnosis not present

## 2017-04-03 DIAGNOSIS — H2513 Age-related nuclear cataract, bilateral: Secondary | ICD-10-CM | POA: Diagnosis not present

## 2017-04-15 ENCOUNTER — Telehealth: Payer: Self-pay

## 2017-04-15 NOTE — Telephone Encounter (Signed)
Received fax from [facility] [place] of patients #2 Shingrix 31mg injection , given  04/11/2017, documented and sent to be scanned into epic.

## 2017-04-28 NOTE — Progress Notes (Addendum)
Cardiology Office Note   Date:  04/29/2017   ID:  Rachel Carlson, DOB [birth year], MRN [id number]  PCP:  Abner Greenspan, MD  Cardiologist:   Minus Breeding, MD    Chief Complaint  Patient presents with  . Shortness of Breath      History of Present Illness: Rachel Carlson is a 72 y.o. female who presents for a history of palpitations and PVCs.  She was previously seen by Dr. Aundra Dubin.  She has a family history of sudden cardiac death and CAD.  She had an echo in 2010 which was essentially unremarkable and a Holter that demonstrated PVCs.    She had dyspnea when I saw her last year.  She had a negative POET (Plain Old Exercise Treadmill) .  Since that time she has had no acute complaints.  She still feels occasional ventricular ectopy.  She denies any chest pressure, neck or arm discomfort.  She has no new shortness of breath, PND or orthopnea.  She unfortunately is not getting much exercise though she does some housework.  She does not eat right because she says she has to cook for her son and husband who prefer the Southern comfort food.   Past Medical History:  Diagnosis Date  . Blood transfusion without reported diagnosis   . Chronic right ear pain    normal MRI  . Esophageal stricture   . Fibroids    uterine, history of  . GERD (gastroesophageal reflux disease)   . History of uterine prolapse   . Hx of adenomatous polyp of colon 10/19/2014  . Hyperlipidemia   . Obesity   . PVC (premature ventricular contraction)    first dx by holter in 1980's, echo (6/10) EF 28-78%, normal diastolic fxn, normal size RV and fxn, mild MR, PASP 55mHg  . Tubal pregnancy   . 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 BLADDER NECK SUSPENSION  1998  . COLONOSCOPY  05/18/2011   Dr. CSilvano Rusk . ECTOPIC PREGNANCY SURGERY  1980  .  ESOPHAGOGASTRODUODENOSCOPY (EGD) WITH ESOPHAGEAL DILATION  04/03/12   Dr. CSilvano Rusk . PARTIAL HYSTERECTOMY     abdominal, fibroids, and prolapse (1998) bladder tack  . UPPER GASTROINTESTINAL ENDOSCOPY       Current Outpatient Medications  Medication Sig Dispense Refill  . aspirin 81 MG tablet Take 81 mg by mouth daily.      . calcium citrate-vitamin D (CITRACAL+D) 315-200 MG-UNIT per tablet Take 1 tablet by mouth 2 (two) times daily.     . Cholecalciferol (VITAMIN D-3) 5000 UNITS TABS 3,000 Units. 1 tablet by mouth on Monday,Wednesday and Friday    . Coenzyme Q10 (CO Q 10 PO) Take 400 Units by mouth daily.      . COLOCORT 100 MG/60ML enema PLACE ONE ENEMA RECTALLY EVERY DAY AT BEDTIME 1680 mL 0  . Cranberry 300 MG tablet Take 300 mg by mouth daily.      . folic acid (FOLVITE) 1 MG tablet Take 1 tablet (1 mg total) by mouth daily. 30 tablet 11  . Glucosamine-Chondroitin (GLUCOSAMINE CHONDR COMPLEX PO) Take 2 capsules by mouth daily.     . Grape Seed Extract 100 MG CAPS Take 1 capsule by mouth daily.     . hydrocortisone (ANUSOL-HC) 2.5 % rectal cream Place 1 application rectally  2 (two) times daily. 30 g 1  . hydrocortisone (CORTENEMA) 100 MG/60ML enema Place 1 enema (100 mg total) rectally at bedtime as needed. 30 enema 0  . LIALDA 1.2 g EC tablet TAKE TWO TABLETS BY MOUTH TWICE DAILY 120 tablet 5  . Omega-3 Fatty Acids (FISH OIL TRIPLE STRENGTH) 1400 MG CAPS Take by mouth daily.      . RABEprazole (ACIPHEX) 20 MG tablet Take 1 tablet (20 mg total) by mouth 2 (two) times daily before a meal. 180 tablet 3  . raloxifene (EVISTA) 60 MG tablet Take 1 tablet (60 mg total) by mouth daily. 90 tablet 3  . rosuvastatin (CRESTOR) 20 MG tablet Take 1 tablet (20 mg total) by mouth at bedtime. 90 tablet 3  . VITAMIN B COMPLEX-C CAPS Take 1 capsule by mouth daily.      No current facility-administered medications for this visit.     Allergies:   Pantoprazole sodium; Ranitidine; Omeprazole; and  Sulfasalazine  ROS:  Please see the history of present illness.   Otherwise, review of systems are positive for none.   All other systems are reviewed and negative.    PHYSICAL EXAM: VS:  BP (!) 152/80   Pulse 73   Ht 5' 2.5" (1.588 m)   Wt 200 lb (90.7 kg)   LMP 05/29/1996   BMI 36.00 kg/m  , BMI Body mass index is 36 kg/m.  GENERAL:  Well appearing NECK:  No jugular venous distention, waveform within normal limits, carotid upstroke brisk and symmetric, no bruits, no thyromegaly LUNGS:  Clear to auscultation bilaterally CHEST:  Unremarkable HEART:  PMI not displaced or sustained,S1 and S2 within normal limits, no S3, no S4, no clicks, no rubs, no murmurs ABD:  Flat, positive bowel sounds normal in frequency in pitch, no bruits, no rebound, no guarding, no midline pulsatile mass, no hepatomegaly, no splenomegaly EXT:  2 plus pulses throughout, no edema, no cyanosis no clubbing   EKG:  EKG  ordered today. The ekg ordered 10/26/15 demonstrates normal sinus rhythm, rate 73, axis within normal limits, intervals within normal limits, no acute ST-T wave changes. PVCs   Recent Labs: 07/09/2016: ALT 23; BUN 11; Creatinine, Ser 0.75; Hemoglobin 14.1; Platelets 218.0; Potassium 4.7; Sodium 140; TSH 2.58    Lipid Panel    Component Value Date/Time   CHOL 162 07/09/2016 0910   TRIG 156.0 (H) 07/09/2016 0910   HDL 58.10 07/09/2016 0910   CHOLHDL 3 07/09/2016 0910   VLDL 31.2 07/09/2016 0910   LDLCALC 73 07/09/2016 0910   LDLDIRECT 257.3 06/04/2008 1005      Wt Readings from Last 3 Encounters:  04/29/17 200 lb (90.7 kg)  11/09/16 200 lb 3.2 oz (90.8 kg)  07/11/16 199 lb 8 oz (90.5 kg)      Other studies Reviewed: Additional studies/ records that were reviewed today include: Labs Review of the above records demonstrates:  Please see elsewhere in the note.     ASSESSMENT AND PLAN:  DYSPNEA:   This is baseline.  I suspect some relationship to weight and deconditioning.  I will  ask her to come back to have an appt with our Health Educator.   OVERWIEGHT:     She needs instruction on diet and exercise.  She seems to bring up lots of barriers mostly around having to cook for her son and husband.   DYSLIPIDEMIA:    Her LDL was OK.  She will continue meds as listed.     HTN:  BP is slightly elevated.  She will get an Omron BP cuff.  I think weight loss will help.   MR:  She had some mild MR on echo in 2000.  I will follow up with an echo.    Current medicines are reviewed at length with the patient today.  The patient does not have concerns regarding medicines.  The following changes have been made:  None  Labs/ tests ordered today include:    Orders Placed This Encounter  Procedures  . EKG 12-Lead  . ECHOCARDIOGRAM COMPLETE     Disposition:   FU with me in one year.     Signed, Minus Breeding, MD  04/29/2017 9:41 AM    [facility]

## 2017-04-29 ENCOUNTER — Encounter: Payer: Self-pay | Admitting: Cardiology

## 2017-04-29 ENCOUNTER — Ambulatory Visit (INDEPENDENT_AMBULATORY_CARE_PROVIDER_SITE_OTHER): Payer: Medicare Other | Admitting: Cardiology

## 2017-04-29 VITALS — BP 152/80 | HR 73 | Ht 62.5 in | Wt 200.0 lb

## 2017-04-29 DIAGNOSIS — I340 Nonrheumatic mitral (valve) insufficiency: Secondary | ICD-10-CM

## 2017-04-29 DIAGNOSIS — I10 Essential (primary) hypertension: Secondary | ICD-10-CM | POA: Diagnosis not present

## 2017-04-29 DIAGNOSIS — E663 Overweight: Secondary | ICD-10-CM | POA: Diagnosis not present

## 2017-04-29 DIAGNOSIS — I493 Ventricular premature depolarization: Secondary | ICD-10-CM | POA: Diagnosis not present

## 2017-04-29 NOTE — Patient Instructions (Signed)
Medication Instructions:  Continue current medications  If you need a refill on your cardiac medications before your next appointment, please call your pharmacy.  Labwork: None Ordered   Testing/Procedures: Your physician has requested that you have an echocardiogram. Echocardiography is a painless test that uses sound waves to create images of your heart. It provides your doctor with information about the size and shape of your heart and how well your heart's chambers and valves are working. This procedure takes approximately one hour. There are no restrictions for this procedure.  Special Instructions: Omron blood pressure cuff  Follow-Up: Your physician wants you to follow-up in: 1 Year. You should receive a reminder letter in the mail two months in advance. If you do not receive a letter, please call our office [phone].      Thank you for choosing [facility] at [address] [facility]!!

## 2017-04-30 DIAGNOSIS — L738 Other specified follicular disorders: Secondary | ICD-10-CM | POA: Diagnosis not present

## 2017-04-30 DIAGNOSIS — B070 Plantar wart: Secondary | ICD-10-CM | POA: Diagnosis not present

## 2017-04-30 DIAGNOSIS — R208 Other disturbances of skin sensation: Secondary | ICD-10-CM | POA: Diagnosis not present

## 2017-05-13 ENCOUNTER — Other Ambulatory Visit: Payer: Self-pay

## 2017-05-13 ENCOUNTER — Ambulatory Visit (HOSPITAL_COMMUNITY): Payer: Medicare Other | Attending: Cardiology

## 2017-05-13 DIAGNOSIS — I519 Heart disease, unspecified: Secondary | ICD-10-CM | POA: Diagnosis not present

## 2017-05-13 DIAGNOSIS — E669 Obesity, unspecified: Secondary | ICD-10-CM | POA: Diagnosis not present

## 2017-05-13 DIAGNOSIS — Z8249 Family history of ischemic heart disease and other diseases of the circulatory system: Secondary | ICD-10-CM | POA: Diagnosis not present

## 2017-05-13 DIAGNOSIS — E785 Hyperlipidemia, unspecified: Secondary | ICD-10-CM | POA: Insufficient documentation

## 2017-05-13 DIAGNOSIS — I340 Nonrheumatic mitral (valve) insufficiency: Secondary | ICD-10-CM | POA: Insufficient documentation

## 2017-05-22 ENCOUNTER — Telehealth: Payer: Self-pay | Admitting: Cardiology

## 2017-05-22 ENCOUNTER — Encounter: Payer: Self-pay | Admitting: *Deleted

## 2017-05-22 NOTE — Telephone Encounter (Signed)
Notes recorded by Minus Breeding, MD on 05/19/2017 at 9:07 PM EDT He had mild MR. No change in therapy. Call Ms. Angst with the results and send results to [place], Wynelle Fanny, MD  Patient aware and verbalized understanding.  Copy mailed to patient per request

## 2017-05-22 NOTE — Telephone Encounter (Signed)
Follow Up:     Pt returning Jenna's call from yesterday,concerning his Eco results.

## 2017-05-23 ENCOUNTER — Telehealth: Payer: Self-pay | Admitting: Cardiology

## 2017-05-23 NOTE — Telephone Encounter (Signed)
Called patient to schedule initial visit with care guide, apt scheduled for 5/1 at 11:30 am.

## 2017-05-29 ENCOUNTER — Ambulatory Visit
Admission: RE | Admit: 2017-05-29 | Discharge: 2017-05-29 | Disposition: A | Discharge status: Home / Self Care | Payer: Medicare Other | Source: Ambulatory Visit | Attending: Family Medicine | Admitting: Family Medicine

## 2017-05-29 ENCOUNTER — Ambulatory Visit: Payer: Medicare Other

## 2017-05-29 ENCOUNTER — Ambulatory Visit (INDEPENDENT_AMBULATORY_CARE_PROVIDER_SITE_OTHER): Payer: Medicare Other

## 2017-05-29 DIAGNOSIS — Z139 Encounter for screening, unspecified: Secondary | ICD-10-CM

## 2017-05-29 DIAGNOSIS — Z0000 Encounter for general adult medical examination without abnormal findings: Secondary | ICD-10-CM

## 2017-05-29 DIAGNOSIS — Z780 Asymptomatic menopausal state: Secondary | ICD-10-CM | POA: Diagnosis not present

## 2017-05-29 DIAGNOSIS — E2839 Other primary ovarian failure: Secondary | ICD-10-CM

## 2017-05-29 DIAGNOSIS — M810 Age-related osteoporosis without current pathological fracture: Secondary | ICD-10-CM | POA: Diagnosis not present

## 2017-05-29 DIAGNOSIS — Z1231 Encounter for screening mammogram for malignant neoplasm of breast: Secondary | ICD-10-CM | POA: Diagnosis not present

## 2017-05-29 NOTE — Progress Notes (Signed)
Week:  1 Progress Notes:  Pt says she has trouble eating because she has to cook large meals for her family. Says she enjoys smaller meals, tofu, and lean meat. But whenever she cooks for her family she either has to eat what she cooks for them or make a separate meal, which is inconvienent. 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 Works in his garden for him.  Challenges: Would like to be more active but says she has a hard time being motivated to do so  Opportunities:  Says she would like to start back going to the ALS support group meetings. Care Guide will look into meeting times. Pt also says she would have more room to be active in her home if she could get rid of her furniture. Would like to sell it and someone come pick it up but doesn't want to use [employer] or [employer]. (Care Guide will look into this as well). Pt would like to lose weight. Starting goal being 10lbs  Client Commitment/Agreement for Next Session:  Pt agreed to keep a food diary as well as start walking down her driveway that is a 1/4 mile long/

## 2017-06-04 ENCOUNTER — Encounter: Payer: Self-pay | Admitting: *Deleted

## 2017-06-13 ENCOUNTER — Telehealth: Payer: Self-pay

## 2017-06-13 NOTE — Telephone Encounter (Signed)
Week: 2 (Phone Session)  Progress Notes:   Patient was unable to keep up with her food diary as she has been traveling for the past week or so. Says she would like to start this week. Has began to take short walks on her driveway.  Client Commitment/Agreement for Next Session: Scheduled next appointment for 5/30 at 11:30 . Pt agreed to attempt to walk her driveway 2x. Care Guide will reach out to Lawana Chambers, the [place] Support group facilitator, for information about the peer support program.

## 2017-06-27 ENCOUNTER — Ambulatory Visit (INDEPENDENT_AMBULATORY_CARE_PROVIDER_SITE_OTHER): Payer: Medicare Other

## 2017-06-27 DIAGNOSIS — Z0000 Encounter for general adult medical examination without abnormal findings: Secondary | ICD-10-CM

## 2017-06-27 NOTE — Progress Notes (Signed)
Week: 3  Progress Notes: Pt discussed her being responsible for the health of her family. Says that she is currently dealing with her husband's increasing limitations and how that effects her activity and the way she begins to cook. Has been reducing their portion sizes and has not "grazed" as much as she normally does. Says that she has tried walking for 10 mins but had a recent colon inflammation that has made it painful. Challenges: Patient has been sleeping on a couch due to her acid reflux. Cannot afford the Aciphex, being that she has to pay out of pocket, says the generic is not as effective.   Opportunities: Care Guide provided pt with telephonic ALS care giver support. Pt is interested bein that she can't make the meeting in [place]. Hopes that they can give her advice.  Client Commitment/Agreement for Next Session: Pt agreed to try to resume activity after inflammation goes down. Worked with patient on increasing vegetable portion size on plate to make the dinner serving appear larger as pt husband feels the portions are not enough. Care Guide will look into any financial assistance for Aciphex. Next session scheduled for 6/13 at 11:30

## 2017-07-03 DIAGNOSIS — B078 Other viral warts: Secondary | ICD-10-CM | POA: Diagnosis not present

## 2017-07-03 DIAGNOSIS — R208 Other disturbances of skin sensation: Secondary | ICD-10-CM | POA: Diagnosis not present

## 2017-07-07 ENCOUNTER — Telehealth: Payer: Self-pay | Admitting: Family Medicine

## 2017-07-07 DIAGNOSIS — K5130 Ulcerative (chronic) rectosigmoiditis without complications: Secondary | ICD-10-CM

## 2017-07-07 DIAGNOSIS — Z0000 Encounter for general adult medical examination without abnormal findings: Secondary | ICD-10-CM

## 2017-07-07 DIAGNOSIS — E559 Vitamin D deficiency, unspecified: Secondary | ICD-10-CM

## 2017-07-07 DIAGNOSIS — E7800 Pure hypercholesterolemia, unspecified: Secondary | ICD-10-CM

## 2017-07-07 DIAGNOSIS — M810 Age-related osteoporosis without current pathological fracture: Secondary | ICD-10-CM

## 2017-07-07 DIAGNOSIS — R739 Hyperglycemia, unspecified: Secondary | ICD-10-CM

## 2017-07-07 NOTE — Telephone Encounter (Signed)
-----   Message from Ellamae Sia sent at 07/02/2017  2:42 PM EDT ----- Regarding: Lab orders for Tuesday, 6.11.19 Patient is scheduled for CPX labs, please order future labs, Thanks , Karna Christmas

## 2017-07-09 ENCOUNTER — Other Ambulatory Visit (INDEPENDENT_AMBULATORY_CARE_PROVIDER_SITE_OTHER): Payer: Medicare Other

## 2017-07-09 DIAGNOSIS — E559 Vitamin D deficiency, unspecified: Secondary | ICD-10-CM | POA: Diagnosis not present

## 2017-07-09 DIAGNOSIS — Z0000 Encounter for general adult medical examination without abnormal findings: Secondary | ICD-10-CM

## 2017-07-09 DIAGNOSIS — E7800 Pure hypercholesterolemia, unspecified: Secondary | ICD-10-CM

## 2017-07-09 DIAGNOSIS — K5130 Ulcerative (chronic) rectosigmoiditis without complications: Secondary | ICD-10-CM | POA: Diagnosis not present

## 2017-07-09 DIAGNOSIS — R739 Hyperglycemia, unspecified: Secondary | ICD-10-CM

## 2017-07-09 LAB — COMPREHENSIVE METABOLIC PANEL
ALT: 13 U/L (ref 0–35)
AST: 14 U/L (ref 0–37)
Albumin: 4 g/dL (ref 3.5–5.2)
Alkaline Phosphatase: 47 U/L (ref 39–117)
BUN: 12 mg/dL (ref 6–23)
CO2: 27 meq/L (ref 19–32)
Calcium: 9.8 mg/dL (ref 8.4–10.5)
Chloride: 107 mEq/L (ref 96–112)
Creatinine, Ser: 0.73 mg/dL (ref 0.40–1.20)
GFR: 83.88 mL/min (ref >60.00)
GLUCOSE: 114 mg/dL — AB (ref 70–99)
Potassium: 4.5 mEq/L (ref 3.5–5.1)
SODIUM: 142 meq/L (ref 135–145)
Total Bilirubin: 0.5 mg/dL (ref 0.2–1.2)
Total Protein: 6.8 g/dL (ref 6.0–8.3)

## 2017-07-09 LAB — CBC WITH DIFFERENTIAL/PLATELET
BASOS PCT: 0.9 % (ref 0.0–3.0)
Basophils Absolute: 0 10*3/uL (ref 0.0–0.1)
EOS PCT: 1.7 % (ref 0.0–5.0)
Eosinophils Absolute: 0.1 10*3/uL (ref 0.0–0.7)
HCT: 40.4 % (ref 36.0–46.0)
Hemoglobin: 13.7 g/dL (ref 12.0–15.0)
LYMPHS ABS: 1.3 10*3/uL (ref 0.7–4.0)
Lymphocytes Relative: 30.9 % (ref 12.0–46.0)
MCHC: 34 g/dL (ref 30.0–36.0)
MCV: 90.3 fl (ref 78.0–100.0)
MONOS PCT: 9.7 % (ref 3.0–12.0)
Monocytes Absolute: 0.4 10*3/uL (ref 0.1–1.0)
NEUTROS ABS: 2.4 10*3/uL (ref 1.4–7.7)
NEUTROS PCT: 56.8 % (ref 43.0–77.0)
PLATELETS: 218 10*3/uL (ref 150.0–400.0)
RBC: 4.48 Mil/uL (ref 3.87–5.11)
RDW: 12.8 % (ref 11.5–15.5)
WBC: 4.2 10*3/uL (ref 4.0–10.5)

## 2017-07-09 LAB — LIPID PANEL
CHOLESTEROL: 162 mg/dL (ref 0–200)
HDL: 49.4 mg/dL (ref >39.00)
LDL CALC: 88 mg/dL (ref 0–99)
NONHDL: 112.76
Total CHOL/HDL Ratio: 3
Triglycerides: 122 mg/dL (ref 0.0–149.0)
VLDL: 24.4 mg/dL (ref 0.0–40.0)

## 2017-07-09 LAB — VITAMIN D 25 HYDROXY (VIT D DEFICIENCY, FRACTURES): VITD: 29.68 ng/mL — ABNORMAL LOW (ref 30.00–100.00)

## 2017-07-09 LAB — HEMOGLOBIN A1C: HEMOGLOBIN A1C: 6.2 % (ref 4.6–6.5)

## 2017-07-09 LAB — TSH: TSH: 2.64 u[IU]/mL (ref 0.35–4.50)

## 2017-07-11 ENCOUNTER — Ambulatory Visit: Payer: Medicare Other

## 2017-07-15 ENCOUNTER — Encounter: Payer: Self-pay | Admitting: Family Medicine

## 2017-07-15 ENCOUNTER — Ambulatory Visit (INDEPENDENT_AMBULATORY_CARE_PROVIDER_SITE_OTHER): Payer: Medicare Other | Admitting: Family Medicine

## 2017-07-15 VITALS — BP 118/78 | HR 95 | Temp 98.6°F | Ht 62.25 in | Wt 202.5 lb

## 2017-07-15 DIAGNOSIS — E7800 Pure hypercholesterolemia, unspecified: Secondary | ICD-10-CM | POA: Diagnosis not present

## 2017-07-15 DIAGNOSIS — M810 Age-related osteoporosis without current pathological fracture: Secondary | ICD-10-CM

## 2017-07-15 DIAGNOSIS — E559 Vitamin D deficiency, unspecified: Secondary | ICD-10-CM

## 2017-07-15 DIAGNOSIS — R739 Hyperglycemia, unspecified: Secondary | ICD-10-CM

## 2017-07-15 DIAGNOSIS — Z0000 Encounter for general adult medical examination without abnormal findings: Secondary | ICD-10-CM | POA: Diagnosis not present

## 2017-07-15 DIAGNOSIS — K5130 Ulcerative (chronic) rectosigmoiditis without complications: Secondary | ICD-10-CM

## 2017-07-15 DIAGNOSIS — Z6836 Body mass index (BMI) 36.0-36.9, adult: Secondary | ICD-10-CM

## 2017-07-15 MED ORDER — ROSUVASTATIN CALCIUM 20 MG PO TABS: 20.0000 mg | ORAL_TABLET | Freq: Every day | ORAL | 3 refills | Status: DC

## 2017-07-15 MED ORDER — RALOXIFENE HCL 60 MG PO TABS: 60.0000 mg | ORAL_TABLET | Freq: Every day | ORAL | 3 refills | Status: DC

## 2017-07-15 NOTE — Assessment & Plan Note (Signed)
Disc goals for lipids and reasons to control them Rev last labs with pt Rev low sat fat diet in detail continue crestor

## 2017-07-15 NOTE — Patient Instructions (Addendum)
Add 4000 iu of vitamin D daily (along with what you are taking)   Please work on an advance directive   Aim for an extra 30 minutes per day of exercise  Make some fitness goals

## 2017-07-15 NOTE — Assessment & Plan Note (Signed)
Lab Results  Component Value Date   HGBA1C 6.2 07/09/2017   disc imp of low glycemic diet and wt loss to prevent DM2  Handout given for prediabetic diet

## 2017-07-15 NOTE — Assessment & Plan Note (Signed)
Level of 29 Urged to add 4000 iu per day to what she is taking Imp disc to bone and overall health Pt has OP

## 2017-07-15 NOTE — Progress Notes (Signed)
Subjective:    Patient ID: Rachel Carlson, female    DOB: [birth year], 72 y.o.   MRN: [id number]  HPI I have personally reviewed the Medicare Annual Wellness questionnaire and have noted 1. The patient's medical and social history 2. Their use of alcohol, tobacco or illicit drugs 3. Their current medications and supplements 4. The patient's functional ability including ADL's, fall risks, home safety risks and hearing or visual             impairment. 5. Diet and physical activities 6. Evidence for depression or mood disorders  The patients weight, height, BMI have been recorded in the chart and visual acuity is per eye clinic.  I have made referrals, counseling and provided education to the patient based review of the above and I have provided the pt with a written personalized care plan for preventive services. Reviewed and updated provider list, see scanned forms.  Wt Readings from Last 3 Encounters:  07/15/17 202 lb 8 oz (91.9 kg)  04/29/17 200 lb (90.7 kg)  11/09/16 200 lb 3.2 oz (90.8 kg)  wt is up 2 lb  36.74 kg/m    Hearing Screening   125Hz  250Hz  500Hz  1000Hz  2000Hz  3000Hz  4000Hz  6000Hz  8000Hz   Right ear:   40 40 40  40    Left ear:   40 40 40  40    Vision Screening Comments: Eye exam with Dr. Radford Pax on 04/03/17    Feeling ok in general  Helps with garden  Housework Not doing a lot more    See scanned forms.  Routine anticipatory guidance given to patient.  See health maintenance. 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  She remembers if she forgets something  occ walks into a room and forgets why  She occ misplaces things   PMH and SH reviewed  Meds, vitals, and allergies reviewed.   ROS: See HPI.  Otherwise negative.     Hyperglycemia Lab Results  Component Value Date   HGBA1C 6.2 07/09/2017  not watching diet enough  She is watching portions and not snacking as much  Some protein snacks Some walking for exercise / including housework  This is up from 6.1   Hyperlipidemia  Lab Results  Component Value Date   CHOL 162 07/09/2017   CHOL 162 07/09/2016   CHOL 163 07/12/2015   Lab Results  Component Value Date   HDL 49.40 07/09/2017   HDL 58.10 07/09/2016   HDL 62.80 07/12/2015   Lab Results  Component Value Date   LDLCALC 88 07/09/2017   LDLCALC 73 07/09/2016   LDLCALC 77 07/12/2015   Lab Results  Component Value Date   TRIG 122.0 07/09/2017   TRIG 156.0 (H) 07/09/2016   TRIG 118.0 07/12/2015   Lab Results  Component Value Date   CHOLHDL 3 07/09/2017   CHOLHDL 3 07/09/2016   CHOLHDL 3 07/12/2015   Lab Results  Component Value Date  LDLDIRECT 257.3 06/04/2008   LDLDIRECT 203.9 06/04/2007   crestor and diet  Overall in good control  Will exercise more for HDL   Lab Results  Component Value Date   CREATININE 0.73 07/09/2017   BUN 12 07/09/2017   NA 142 07/09/2017   K 4.5 07/09/2017   CL 107 07/09/2017   CO2 27 07/09/2017   Lab Results  Component Value Date   ALT 13 07/09/2017   AST 14 07/09/2017   ALKPHOS 47 07/09/2017   BILITOT 0.5 07/09/2017    Glucose is 114  Lab Results  Component Value Date   TSH 2.64 07/09/2017      Patient Active Problem List   Diagnosis Date Noted  . Hyperglycemia 07/20/2015  . Estrogen deficiency 04/27/2015  . GERD (gastroesophageal reflux  disease) 03/23/2015  . Hx of adenomatous polyp of colon 10/19/2014  . 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 Pain in joint, pelvic region and thigh 07/24/2014  . Need for hepatitis C screening test 07/12/2014  . Class 2 severe obesity due to excess calories with serious comorbidity and body mass index (BMI) of 36.0 to 36.9 in adult ([place]) 07/12/2014  . Pelvic pain in female 07/12/2014  . Vitamin D deficiency 07/03/2014  . Encounter for Medicare annual wellness exam 07/08/2013  . Osteoporosis 05/24/2013  . 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 Adnexal pain 11/15/2010  . Urge incontinence 11/15/2010  . Family history of coronary artery disease 10/15/2010  . Routine general medical examination at a health care facility 06/01/2010  . [place], [place] 06/06/2009  . Hyperlipidemia 06/03/2007  . Urge urinary incontinence 06/03/2007  . MIGRAINES, HX OF 06/03/2007   Past Medical History:  Diagnosis Date  . Blood transfusion without reported diagnosis   . Chronic right ear pain    normal MRI  . Esophageal stricture   . Fibroids    uterine, history of  . GERD (gastroesophageal reflux disease)   . History of uterine prolapse   . Hx of adenomatous polyp of colon 10/19/2014  . Hyperlipidemia   . Obesity   . PVC (premature ventricular contraction)    first dx by holter in 1980's, echo (6/10) EF 57-26%, normal diastolic fxn, normal size RV and fxn, mild MR, PASP 48mHg  . Tubal pregnancy   . [place]   . Urge urinary incontinence 06/03/2007   Qualifier: Diagnosis of  By: BMarcelino ScotCMA, AAuburn Bilberry    Past Surgical History:  Procedure Laterality Date  . BLADDER NECK SUSPENSION  1998  . BREAST BIOPSY Left   . COLONOSCOPY  05/18/2011   Dr. CSilvano Rusk . ECTOPIC PREGNANCY SURGERY  1980  . ESOPHAGOGASTRODUODENOSCOPY (EGD) WITH ESOPHAGEAL DILATION  04/03/12   Dr. CSilvano Rusk . PARTIAL HYSTERECTOMY      abdominal, fibroids, and prolapse (1998) bladder tack  . UPPER GASTROINTESTINAL ENDOSCOPY     Social History   Tobacco Use  . Smoking status: Never Smoker  . Smokeless tobacco: Never Used  Substance Use Topics  . Alcohol use: No    Alcohol/week: 0.0 oz    Comment: rarely beer  . Drug use: No   Family History  Problem Relation Age of Onset  . Sudden death Father 459      "coronary arteriosclerosis" on death certificate  . Heart attack Mother 731 . Transient ischemic attack Mother   . Diabetes type II Mother   . Sudden death Mother  died age 62  . Diabetes Mother   . Coronary artery disease Mother   . Arrhythmia Brother   . Colon cancer Brother        40's  . Breast cancer Sister   . Colon polyps Sister        adenomatous  . Other Son        pituitary tumor    Allergies  Allergen Reactions  . Pantoprazole Sodium     Abdominal pain, diarrhea  . Ranitidine     Diarrhea, abdominal pain  . Omeprazole Diarrhea    Abdominal pain also   . Sulfasalazine Rash    Fever, chills, headache, muscle pain   Current Outpatient Medications on File Prior to Visit  Medication Sig Dispense Refill  . aspirin 81 MG tablet Take 81 mg by mouth daily.      . calcium citrate-vitamin D (CITRACAL+D) 315-200 MG-UNIT per tablet Take 1 tablet by mouth 2 (two) times daily.     . Cholecalciferol (VITAMIN D-3) 5000 UNITS TABS 3,000 Units. 1 tablet by mouth on Monday,Wednesday and Friday    . Coenzyme Q10 (CO Q 10 PO) Take 400 Units by mouth daily.      . COLOCORT 100 MG/60ML enema PLACE ONE ENEMA RECTALLY EVERY DAY AT BEDTIME 1680 mL 0  . Cranberry 300 MG tablet Take 300 mg by mouth daily.      . folic acid (FOLVITE) 1 MG tablet Take 1 tablet (1 mg total) by mouth daily. 30 tablet 11  . Glucosamine-Chondroitin (GLUCOSAMINE CHONDR COMPLEX PO) Take 2 capsules by mouth daily.     . Grape Seed Extract 100 MG CAPS Take 1 capsule by mouth daily.     Marland Kitchen LIALDA 1.2 g EC tablet TAKE TWO TABLETS BY  MOUTH TWICE DAILY 120 tablet 5  . Omega-3 Fatty Acids (FISH OIL TRIPLE STRENGTH) 1400 MG CAPS Take by mouth daily.      . RABEprazole (ACIPHEX) 20 MG tablet Take 1 tablet (20 mg total) by mouth 2 (two) times daily before a meal. 180 tablet 3  . VITAMIN B COMPLEX-C CAPS Take 1 capsule by mouth daily.     . hydrocortisone (ANUSOL-HC) 2.5 % rectal cream Place 1 application rectally 2 (two) times daily. (Patient not taking: Reported on 07/15/2017) 30 g 1  . hydrocortisone (CORTENEMA) 100 MG/60ML enema Place 1 enema (100 mg total) rectally at bedtime as needed. (Patient not taking: Reported on 07/15/2017) 30 enema 0   No current facility-administered medications on file prior to visit.      Review of Systems  Constitutional: Negative for activity change, appetite change, fatigue, fever and unexpected weight change.  HENT: Negative for congestion, ear pain, rhinorrhea, sinus pressure and sore throat.   Eyes: Negative for pain, redness and visual disturbance.  Respiratory: Negative for cough, shortness of breath and wheezing.   Cardiovascular: Negative for chest pain and palpitations.  Gastrointestinal: Negative for abdominal pain, blood in stool, constipation and diarrhea.  Endocrine: Negative for polydipsia and polyuria.  Genitourinary: Negative for dysuria, frequency and urgency.  Musculoskeletal: Negative for arthralgias, back pain and myalgias.       Aches and pains  Skin: Negative for pallor and rash.  Allergic/Immunologic: Negative for environmental allergies.  Neurological: Negative for dizziness, syncope and headaches.  Hematological: Negative for adenopathy. Does not bruise/bleed easily.  Psychiatric/Behavioral: Negative for decreased concentration and dysphoric mood. The patient is not nervous/anxious.        Lack of motivation  Objective:   Physical Exam  Constitutional: She appears well-developed and well-nourished. No distress.  obese and well appearing   HENT:  Head:  Normocephalic and atraumatic.  Right Ear: External ear normal.  Left Ear: External ear normal.  Mouth/Throat: Oropharynx is clear and moist.  Eyes: Pupils are equal, round, and reactive to light. Conjunctivae and EOM are normal. No scleral icterus.  Neck: Normal range of motion. Neck supple. No JVD present. Carotid bruit is not present. No thyromegaly present.  Cardiovascular: Normal rate, regular rhythm, normal heart sounds and intact distal pulses. Exam reveals no gallop.  bilat ropey varicose veins - LEs  Pulmonary/Chest: Effort normal and breath sounds normal. No respiratory distress. She has no wheezes. She exhibits no tenderness. No breast tenderness, discharge or bleeding.  Abdominal: Soft. Bowel sounds are normal. She exhibits no distension, no abdominal bruit and no mass. There is no tenderness.  Genitourinary: No breast tenderness, discharge or bleeding.  Genitourinary Comments: Breast exam: No mass, nodules, thickening, tenderness, bulging, retraction, inflamation, nipple discharge or skin changes noted.  No axillary or clavicular LA.      Musculoskeletal: Normal range of motion. She exhibits no edema or tenderness.  No kyphosis   Lymphadenopathy:    She has no cervical adenopathy.  Neurological: She is alert. She has normal reflexes. She displays normal reflexes. No cranial nerve deficit. She exhibits normal muscle tone. Coordination normal.  Skin: Skin is warm and dry. No rash noted. No erythema. No pallor.  Solar lentigines diffusely  Some angiomas on trunk  Psychiatric: She has a normal mood and affect.  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[place])    Continue GI f/u  occ flare  Fairly well controlled         Musculoskeletal and Integument   Osteoporosis    Rev dexa - OP in spine Continues evista  No falls or fx  On ca plus D Will add 4000 iu D daily  Exercise and fall  prev disc      Relevant Medications   raloxifene (EVISTA) 60 MG tablet     Other   Class 2 severe obesity due to excess calories with serious comorbidity and body mass index (BMI) of 36.0 to 36.9 in adult [facility])    Discussed how this problem influences overall health and the risks it imposes  Reviewed plan for weight loss with lower calorie diet (via better food choices and also portion control or program like weight watchers) and exercise building up to or more than 30 minutes 5 days per week including some aerobic activity         Encounter for Medicare annual wellness exam - Primary    Reviewed health habits including diet and exercise and skin cancer prevention Reviewed appropriate screening tests for age  Also reviewed health mt list, fam hx and immunization status , as well as social and family history   See HPI Labs reviewed  Will work on her advance directive  D level low-disc rec for that  Also for exercise       Hyperglycemia    Lab Results  Component Value Date   HGBA1C 6.2 07/09/2017   disc imp of low glycemic diet and wt loss to prevent DM2  Handout given for prediabetic diet       Hyperlipidemia    Disc goals  for lipids and reasons to control them Rev last labs with pt Rev low sat fat diet in detail continue crestor       Relevant Medications   rosuvastatin (CRESTOR) 20 MG tablet   Vitamin D deficiency    Level of 29 Urged to add 4000 iu per day to what she is taking Imp disc to bone and overall health Pt has OP

## 2017-07-15 NOTE — Assessment & Plan Note (Signed)
Reviewed health habits including diet and exercise and skin cancer prevention Reviewed appropriate screening tests for age  Also reviewed health mt list, fam hx and immunization status , as well as social and family history   See HPI Labs reviewed  Will work on her advance directive  D level low-disc rec for that  Also for exercise

## 2017-07-15 NOTE — Assessment & Plan Note (Signed)
Rev dexa - OP in spine Continues evista  No falls or fx  On ca plus D Will add 4000 iu D daily  Exercise and fall prev disc

## 2017-07-15 NOTE — Assessment & Plan Note (Signed)
Continue GI f/u  occ flare  Fairly well controlled

## 2017-07-15 NOTE — Assessment & Plan Note (Signed)
Discussed how this problem influences overall health and the risks it imposes  Reviewed plan for weight loss with lower calorie diet (via better food choices and also portion control or program like weight watchers) and exercise building up to or more than 30 minutes 5 days per week including some aerobic activity    

## 2017-07-26 ENCOUNTER — Telehealth: Payer: Self-pay

## 2017-07-26 NOTE — Telephone Encounter (Signed)
Called to reschedule care guide visit. 7/11 at 11:30. Pt says she will call if she needs to cancel.

## 2017-08-04 ENCOUNTER — Encounter: Payer: Self-pay | Admitting: Internal Medicine

## 2017-08-04 DIAGNOSIS — Z800 Family history of malignant neoplasm of digestive organs: Secondary | ICD-10-CM | POA: Insufficient documentation

## 2017-08-05 ENCOUNTER — Telehealth: Payer: Self-pay

## 2017-08-05 DIAGNOSIS — R208 Other disturbances of skin sensation: Secondary | ICD-10-CM | POA: Diagnosis not present

## 2017-08-05 DIAGNOSIS — L821 Other seborrheic keratosis: Secondary | ICD-10-CM | POA: Diagnosis not present

## 2017-08-05 DIAGNOSIS — Z800 Family history of malignant neoplasm of digestive organs: Secondary | ICD-10-CM

## 2017-08-05 DIAGNOSIS — B078 Other viral warts: Secondary | ICD-10-CM | POA: Diagnosis not present

## 2017-08-05 NOTE — Telephone Encounter (Signed)
Patient notified and she agrees to proceed with genetics.  She is advised that they will contact him directly with an appt from the [place] center

## 2017-08-05 NOTE — Telephone Encounter (Signed)
-----   Message from Gatha Mayer, MD sent at 08/04/2017 12:41 PM EDT ----- Regarding: contact patient re: son with Lynch Syndrome Please tell her I became aware that son has Lynch Syndrome and I would like her to see genetics counselors to consider that she be tested

## 2017-08-07 ENCOUNTER — Telehealth: Payer: Self-pay | Admitting: Genetic Counselor

## 2017-08-07 ENCOUNTER — Encounter: Payer: Self-pay | Admitting: Genetic Counselor

## 2017-08-07 NOTE — Telephone Encounter (Signed)
New genetic counseling referral received from Dr. Carlean Purl for fhx of lynch syndrome. Pt has been scheduled to see Roma Kayser on 8/19 at 1pm. Pt aware to arrive 30 minutes early. Letter mailed.

## 2017-08-08 ENCOUNTER — Ambulatory Visit (INDEPENDENT_AMBULATORY_CARE_PROVIDER_SITE_OTHER): Payer: Medicare Other

## 2017-08-08 DIAGNOSIS — Z0000 Encounter for general adult medical examination without abnormal findings: Secondary | ICD-10-CM

## 2017-08-08 NOTE — Progress Notes (Signed)
Week: 4  Progress Notes: Pt inflammation has gone down but is still having issues. Still having pain in her knee, will make apt. Pt has been working in her garden for 2-3 hours at the time. Will walk out in her yard, but notices pain about 15 mins in.   Challenges: Emotionally separate herself from herself her care giving duties.   Client Commitment/Agreement for Next Session: Pt will continue working on increases activity, She will continue to monitor discomfort. As well as working on personal hobbies outside of caretaking.

## 2017-08-22 ENCOUNTER — Telehealth: Payer: Self-pay

## 2017-08-22 NOTE — Telephone Encounter (Signed)
Week: 5  Progress Notes: Pt is still working in the garden working on [employer], [employer]. Has lost 1-2 lbs.Has set apt for discomfort in abdomen. Has been keeping up with portion size, and what she eats.  Opportunities: Informed pt of [employer], would like more information.  Client Commitment/Agreement for Next Session: Being that pt is busy in garden ,she will call to schedule next apt once she is done.

## 2017-08-29 DIAGNOSIS — B078 Other viral warts: Secondary | ICD-10-CM | POA: Diagnosis not present

## 2017-08-29 DIAGNOSIS — R208 Other disturbances of skin sensation: Secondary | ICD-10-CM | POA: Diagnosis not present

## 2017-08-30 ENCOUNTER — Other Ambulatory Visit: Payer: Self-pay

## 2017-09-02 ENCOUNTER — Encounter

## 2017-09-02 ENCOUNTER — Ambulatory Visit (INDEPENDENT_AMBULATORY_CARE_PROVIDER_SITE_OTHER): Payer: Medicare Other | Admitting: Internal Medicine

## 2017-09-02 ENCOUNTER — Encounter (INDEPENDENT_AMBULATORY_CARE_PROVIDER_SITE_OTHER): Payer: Self-pay

## 2017-09-02 ENCOUNTER — Encounter: Payer: Self-pay | Admitting: Internal Medicine

## 2017-09-02 VITALS — BP 126/88 | HR 72 | Ht 62.5 in | Wt 199.0 lb

## 2017-09-02 DIAGNOSIS — K222 Esophageal obstruction: Secondary | ICD-10-CM | POA: Diagnosis not present

## 2017-09-02 DIAGNOSIS — K51319 Ulcerative (chronic) rectosigmoiditis with unspecified complications: Secondary | ICD-10-CM

## 2017-09-02 DIAGNOSIS — R194 Change in bowel habit: Secondary | ICD-10-CM | POA: Diagnosis not present

## 2017-09-02 DIAGNOSIS — Z800 Family history of malignant neoplasm of digestive organs: Secondary | ICD-10-CM | POA: Diagnosis not present

## 2017-09-02 DIAGNOSIS — K6289 Other specified diseases of anus and rectum: Secondary | ICD-10-CM

## 2017-09-02 DIAGNOSIS — K219 Gastro-esophageal reflux disease without esophagitis: Secondary | ICD-10-CM | POA: Diagnosis not present

## 2017-09-02 MED ORDER — LANSOPRAZOLE 15 MG PO CPDR: 15.0000 mg | DELAYED_RELEASE_CAPSULE | Freq: Two times a day (BID) | ORAL | Status: DC

## 2017-09-02 NOTE — Patient Instructions (Signed)
  Stop the generic Aciphex and start over the counter Lansoprazole 15 mg , take one twice a day 30 minutes before breakfast and supper.  We will arrange future procedures after the results are back from the genetics testing.   I appreciate the opportunity to care for you. Silvano Rusk, MD, [facility]

## 2017-09-02 NOTE — Progress Notes (Signed)
Rachel Carlson 72 y.o. [birth year] [id number]  Assessment & Plan:   Encounter Diagnoses  Name Primary?  . Rectal pain Yes  . Change in bowel habits   . GERD with stricture   . 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 Family history of Lynch syndrome     She has had some rectal pain and change in bowel habits question related to her ulcerative rectosigmoiditis.  It does not seem like a fissure and I did not identify one today.  She needs at least a flex sig, but pending her genetics analysis could need a colonoscopy as 2 of her sons appear to have Lynch syndrome.  Her problems are intermittent and not rapidly progressive her labs are good we will wait to sort out the extent of exam necessary based upon the above.  We should know this later this month hopefully and get something set up for September.  I will probably need to work her in somewhere.  She knows to call back sooner if things worsen.  She has symptomatic GERD and stricture, generic AcipHex is not doing things, her insurance coverage is poor so we will try lansoprazole 15 mg twice daily.  She is advised to chew carefully and eat slowly as well.  It sounds like she is reasonable compliant with lifestyle, she actually sleeps in a chair.  She uses Pepto-Bismol as needed and continue to do that, she cannot take a magnesium-containing antacid like Gaviscon because of potential interaction with her Crestor.  I appreciate the opportunity to care for this patient. CC: Tower, Wynelle Fanny, MD   Subjective:   Chief Complaint: Rectal pain and defecation problems, reflux and dysphagia  HPI The patient is here for follow-up in the setting of a history of GERD with stricture and ulcerative proctosigmoiditis on Lialda.  When seen last year she was doing reasonably well with some occasional rectal discomfort and lidocaine topically was recommended.  In the interim since May she has had about twice weekly episodes of  difficulty with defecation and is painful.  She is not necessarily constipated or having hard stools but cannot expel a bowel movement because it is too painful with a pressure-like pain which she says is up in side.  She will have to self disimpact.  She is not felt anything but she wonders if there is not some sort of a ring or stricture causing this.  She is on generic rabeprazole now which is not covered by her insurance, and says she is having some breakthrough heartburn and intermittent dysphagia to solid foods like rice bread and chicken.  It is not frequent and not to the point where she thinks she needs a dilation.  She actually sleeps upright in a chair.  She will use some Pepto-Bismol intermittently when she has some nocturnal reflux.  Wt Readings from Last 3 Encounters:  09/02/17 199 lb (90.3 kg)  07/15/17 202 lb 8 oz (91.9 kg)  04/29/17 200 lb (90.7 kg)    She has 2 sons with early colon cancer non-polyposis, apparently they have Lynch syndrome, 1 of her sons is a patient of 1 of my partners and I was informed of this and the patient has a genetics evaluation set up on August 19.  It sounds like there was some other family members with early colon cancer in an elderly relative with uterine cancer as well.  Her last colonoscopy was in 2016 with a diminutive adenoma only and she had her proctosigmoiditis  seen.  Allergies  Allergen Reactions  . Pantoprazole Sodium     Abdominal pain, diarrhea  . Ranitidine     Diarrhea, abdominal pain  . Omeprazole Diarrhea    Abdominal pain also   . Sulfasalazine Rash    Fever, chills, headache, muscle pain   Current Meds  Medication Sig  . aspirin 81 MG tablet Take 81 mg by mouth daily.    . calcium citrate-vitamin D (CITRACAL+D) 315-200 MG-UNIT per tablet Take 1 tablet by mouth 2 (two) times daily.   . Cholecalciferol (VITAMIN D-3) 5000 UNITS TABS 3,000 Units. 1 tablet by mouth on Monday,Wednesday and Friday  . Coenzyme Q10 (CO Q 10 PO) Take  400 Units by mouth daily.    . COLOCORT 100 MG/60ML enema PLACE ONE ENEMA RECTALLY EVERY DAY AT BEDTIME  . Cranberry 300 MG tablet Take 300 mg by mouth daily.    . folic acid (FOLVITE) 1 MG tablet Take 1 tablet (1 mg total) by mouth daily.  . Glucosamine-Chondroitin (GLUCOSAMINE CHONDR COMPLEX PO) Take 2 capsules by mouth daily.   . Grape Seed Extract 100 MG CAPS Take 1 capsule by mouth daily.   . hydrocortisone (ANUSOL-HC) 2.5 % rectal cream Place 1 application rectally 2 (two) times daily.  . hydrocortisone (CORTENEMA) 100 MG/60ML enema Place 1 enema (100 mg total) rectally at bedtime as needed.  Marland Kitchen LIALDA 1.2 g EC tablet TAKE TWO TABLETS BY MOUTH TWICE DAILY  . Omega-3 Fatty Acids (FISH OIL TRIPLE STRENGTH) 1400 MG CAPS Take by mouth daily.    . RABEprazole (ACIPHEX) 20 MG tablet Take 1 tablet (20 mg total) by mouth 2 (two) times daily before a meal.  . raloxifene (EVISTA) 60 MG tablet Take 1 tablet (60 mg total) by mouth daily.  . rosuvastatin (CRESTOR) 20 MG tablet Take 1 tablet (20 mg total) by mouth at bedtime.  Marland Kitchen VITAMIN B COMPLEX-C CAPS Take 1 capsule by mouth daily.    Past Medical History:  Diagnosis Date  . Blood transfusion without reported diagnosis   . Chronic right ear pain    normal MRI  . Esophageal stricture   . Fibroids    uterine, history of  . GERD (gastroesophageal reflux disease)   . History of uterine prolapse   . Hx of adenomatous polyp of colon 10/19/2014  . Hyperlipidemia   . Obesity   . PVC (premature ventricular contraction)    first dx by holter in 1980's, echo (6/10) EF 46-65%, normal diastolic fxn, normal size RV and fxn, mild MR, PASP 109mHg  . Tubal pregnancy   . 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 Urge urinary incontinence 06/03/2007   Qualifier: Diagnosis of  By: BMarcelino ScotCMA, AAuburn Bilberry    Past Surgical History:  Procedure Laterality Date  . BLADDER NECK SUSPENSION  1998  . BREAST BIOPSY Left   . COLONOSCOPY  05/18/2011   Dr. CSilvano Rusk   . ECTOPIC PREGNANCY SURGERY  1980  . ESOPHAGOGASTRODUODENOSCOPY (EGD) WITH ESOPHAGEAL DILATION  04/03/12   Dr. CSilvano Rusk . PARTIAL HYSTERECTOMY     abdominal, fibroids, and prolapse (1998) bladder tack  . 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   Review of Systems As above  Objective:   Physical Exam BP 126/88   Pulse 72   Ht 5' 2.5" (1.588 m)   Wt 199 lb (90.3 kg)   LMP 05/29/1996   BMI 35.82 kg/m  No acute distress Neck supple no mass or thyromegaly palpated Abdomen is mildly protuberant and obese soft and nontender  Rectal exam with female staff present showed some very tiny anal tags, normal anoderm.  Resting and voluntary anal tone is normal.  There is no rectocele there is formed brown stool present.  No mass in the rectal exam is nontender.  Simulated defecation shows normal abdominal contraction appropriate descent and relaxation.  Anoscopy is performed with female staff present and demonstrates grade 1 internal hemorrhoids, no fissure no mucosal abnormality   Data reviewed includes recent primary care notes and labs in the computer from this year.

## 2017-09-03 ENCOUNTER — Other Ambulatory Visit: Payer: Self-pay | Admitting: Family Medicine

## 2017-09-03 DIAGNOSIS — E7800 Pure hypercholesterolemia, unspecified: Secondary | ICD-10-CM

## 2017-09-09 ENCOUNTER — Telehealth: Payer: Self-pay

## 2017-09-16 ENCOUNTER — Inpatient Hospital Stay: Payer: Medicare Other | Attending: Genetic Counselor | Admitting: Genetic Counselor

## 2017-09-16 ENCOUNTER — Encounter: Payer: Self-pay | Admitting: Genetic Counselor

## 2017-09-16 ENCOUNTER — Inpatient Hospital Stay: Payer: Medicare Other

## 2017-09-16 DIAGNOSIS — Z808 Family history of malignant neoplasm of other organs or systems: Secondary | ICD-10-CM | POA: Insufficient documentation

## 2017-09-16 DIAGNOSIS — Z7183 Encounter for nonprocreative genetic counseling: Secondary | ICD-10-CM

## 2017-09-16 DIAGNOSIS — Z8042 Family history of malignant neoplasm of prostate: Secondary | ICD-10-CM

## 2017-09-16 DIAGNOSIS — Z803 Family history of malignant neoplasm of breast: Secondary | ICD-10-CM | POA: Insufficient documentation

## 2017-09-16 DIAGNOSIS — Z800 Family history of malignant neoplasm of digestive organs: Secondary | ICD-10-CM | POA: Insufficient documentation

## 2017-09-16 DIAGNOSIS — Z86010 Personal history of colonic polyps: Secondary | ICD-10-CM

## 2017-09-16 NOTE — Progress Notes (Signed)
REFERRING PROVIDER: Gatha Mayer, MD [address], [place] [address]  PRIMARY PROVIDER:  Abner Greenspan, MD  PRIMARY REASON FOR VISIT:  1. Hx of adenomatous polyp of colon   2. Family history of breast cancer   3. Family history of colon cancer   4. Family history of prostate cancer   5. Family history of melanoma      HISTORY OF PRESENT ILLNESS:   Rachel Carlson, a 72 y.o. female, was seen for a [place] cancer genetics consultation at the request of Dr. Carlean Purl due to a family history of cancer.  Rachel Carlson presents to clinic today to discuss the possibility of a hereditary predisposition to cancer, genetic testing, and to further clarify her future cancer risks, as well as potential cancer risks for family members.   Rachel Carlson is a 72 y.o. female with no personal history of cancer.  She reports that her son was told that he had Lynch syndrome, but that he has not undergone genetic testing.  CANCER HISTORY:   No history exists.     HORMONAL RISK FACTORS:  Menarche was at age 80.  First live birth at age 59.  OCP use for approximately 0 years.  Ovaries intact: yes.  Hysterectomy: yes.  Menopausal status: postmenopausal.  HRT use: 0 years. Colonoscopy: yes; 2-3 polyps. Mammogram within the last year: yes. Number of breast biopsies: 1. Up to date with pelvic exams:  no. Any excessive radiation exposure in the past:  no  Past Medical History:  Diagnosis Date  . Blood transfusion without reported diagnosis   . Chronic right ear pain    normal MRI  . Esophageal stricture   . Family history of breast cancer   . Family history of colon cancer   . Family history of melanoma   . Family history of prostate cancer   . Fibroids    uterine, history of  . GERD (gastroesophageal reflux disease)   . History of uterine prolapse   . Hx of adenomatous polyp of colon 10/19/2014  . Hyperlipidemia   . Obesity   . PVC (premature ventricular contraction)     first dx by holter in 1980's, echo (6/10) EF 02-72%, normal diastolic fxn, normal size RV and fxn, mild MR, PASP 61mHg  . Tubal pregnancy   . Ulcerative proctosigmoiditis ([facility]   . 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 BLADDER NECK SUSPENSION  1998  . BREAST BIOPSY Left   . COLONOSCOPY  05/18/2011   Dr. CSilvano Rusk . ECTOPIC PREGNANCY SURGERY  1980  . ESOPHAGOGASTRODUODENOSCOPY (EGD) WITH ESOPHAGEAL DILATION  04/03/12   Dr. CSilvano Rusk . PARTIAL HYSTERECTOMY     abdominal, fibroids, and prolapse (1998) bladder tack  . UPPER GASTROINTESTINAL ENDOSCOPY      Social History   Socioeconomic History  . Marital status: Married    Spouse name: Not on file  . Number of children: 3  . Years of education: Not on file  . Highest education level: Not on file  Occupational History  . Occupation: vAnimal nutritionist   Comment:      Employer: unemployed  Social Needs  . Financial resource strain: Not on file  . Food insecurity:    Worry: Not on file    Inability: Not on file  . Transportation needs:    Medical: Not on  file    Non-medical: Not on file  Tobacco Use  . Smoking status: Never Smoker  . Smokeless tobacco: Never Used  Substance and Sexual Activity  . Alcohol use: No    Alcohol/week: 0.0 standard drinks    Comment: rarely beer  . Drug use: No  . Sexual activity: Not on file  Lifestyle  . Physical activity:    Days per week: Not on file    Minutes per session: Not on file  . Stress: Not on file  Relationships  . Social connections:    Talks on phone: Not on file    Gets together: Not on file    Attends religious service: Not on file    Active member of club or organization: Not on file    Attends meetings of clubs or organizations: Not on file    Relationship status: Not on file  Other Topics Concern  . Not on file  Social History Narrative   Retired  Psychologist, clinical, 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)     FAMILY HISTORY:  We obtained a detailed, 4-generation family history.  Significant diagnoses are listed below: Family History  Problem Relation Age of Onset  . Sudden death Father 50       "coronary arteriosclerosis" on death certificate  . Heart attack Mother 86  . Transient ischemic attack Mother   . Diabetes type II Mother   . Sudden death Mother        died age 70  . Diabetes Mother   . Coronary artery disease Mother   . Skin cancer Mother   . Arrhythmia Brother   . Colon cancer Brother 61  . Breast cancer Sister        dx in her 85s  . Colon polyps Sister        adenomatous  . Colon cancer Son 58  . Melanoma Maternal Uncle   . Congestive Heart Failure Maternal Grandmother   . Prostate cancer Maternal Grandfather        dx in 60s  . Tuberculosis Paternal Grandfather   . Skin cancer Maternal Aunt   . Other Son        parotid gland tumor  . Ehlers-Danlos syndrome Daughter   . Colon cancer Cousin        mid 6s; maternal cousin  . Colon cancer Other        MGMs brother    The patient has two sons and a daughter.  Her oldest son was diagnosed with colon cancer at 36.  He was told that he has Lynch syndrome.  Her middle son was diagnosed with a parotid salivary gland tumor in his 55's and her daughter has received a diagnosis of ehlers danlos syndrome hypermobility type.  The patient has two brothers and a sister. One brother was diagnosed with colon cancer at 70 and her sister was diagnosed with breast cancer, and eventually non-hodgkin's lymphoma.  Her sister's daughter was also diagnosed with a connective tissue tumor in her 36's.  The patient's parents are both deceased.  Her father died of a heart attack and her mother from heart disease.  The patient's mother had skin cancer.  She had two sisters and a brother.  One sister also had skin cancer and her brother died of melanoma.  The maternal grandparents are deceased.   The grandfather died of prostate cancer.  The patient's father had two brothers who were cancer free.  The paternal grandparents died of non  cancer related issues.  Rachel Carlson is unaware of previous family history of genetic testing for hereditary cancer risks. Patient's maternal ancestors are of [place] descent, and paternal ancestors are of [place] and [place] descent. There is no reported Ashkenazi Jewish ancestry. There is no known consanguinity.  GENETIC COUNSELING ASSESSMENT: Rachel Carlson is a 72 y.o. female with a family history of early onset colon cancer which is somewhat suggestive of a Lynch syndrome and predisposition to cancer. We, therefore, discussed and recommended the following at today's visit.   DISCUSSION: We discussed that about 5-6% of colon cancer is hereditary with most cases due to Lynch syndrome.  We discussed that there are other conditions that can increase the risk for early onset colon cancer including APC and MUTYH mutations.  We discussed that the best person to test in the family is generally a person who has cancer.  However, the patient's son and brother were both diagnosed with early onset colon cancer. If they have Lynch syndrome, the patients would be an obligate carrier of the condition.  We reviewed the characteristics, features and inheritance patterns of hereditary cancer syndromes. We also discussed genetic testing, including the appropriate family members to test, the process of testing, insurance coverage and turn-around-time for results. We discussed the implications of a negative, positive and/or variant of uncertain significant result. We recommended Rachel Carlson pursue genetic testing for the Multi cancer gene panel. The Multi-Gene Panel offered by [facility] includes sequencing and/or deletion duplication testing of the following 84 genes: AIP, ALK, APC, ATM, AXIN2,BAP1,  BARD1, BLM, BMPR1A, BRCA1, BRCA2, BRIP1, CASR, CDC73, CDH1, CDK4, CDKN1B, CDKN1C,  CDKN2A (p14ARF), CDKN2A (p16INK4a), CEBPA, CHEK2, CTNNA1, DICER1, DIS3L2, EGFR (c.2369C>T, p.Thr790Met variant only), EPCAM (Deletion/duplication testing only), FH, FLCN, GATA2, GPC3, GREM1 (Promoter region deletion/duplication testing only), HOXB13 (c.251G>A, p.Gly84Glu), HRAS, KIT, MAX, MEN1, MET, MITF (c.952G>A, p.Glu318Lys variant only), MLH1, MSH2, MSH3, MSH6, MUTYH, NBN, NF1, NF2, NTHL1, PALB2, PDGFRA, PHOX2B, PMS2, POLD1, POLE, POT1, PRKAR1A, PTCH1, PTEN, RAD50, RAD51C, RAD51D, RB1, RECQL4, RET, RUNX1, SDHAF2, SDHA (sequence changes only), SDHB, SDHC, SDHD, SMAD4, SMARCA4, SMARCB1, SMARCE1, STK11, SUFU, TERC, TERT, TMEM127, TP53, TSC1, TSC2, VHL, WRN and WT1.    We discussed that some people do not want to undergo genetic testing due to fear of genetic discrimination.  A federal law called the Genetic Information Non-Discrimination Act (GINA) of 2008 helps protect individuals against genetic discrimination based on their genetic test results.  It impacts both health insurance and employment.  With health insurance, it protects against increased premiums, being kicked off insurance or being forced to take a test in order to be insured.  For employment it protects against hiring, firing and promoting decisions based on genetic test results.  Health status due to a cancer diagnosis is not protected under GINA.  She does not trust that her insurance would not use this against her so she decided to pay out of pocket for testing. The cost of testing will be $250.  PLAN: After considering the risks, benefits, and limitations, Rachel Carlson  provided informed consent to pursue genetic testing and the blood sample was sent to [facility] for analysis of the Multi-cancer gene panel. Results should be available within approximately 2-3 weeks' time, at which point they will be disclosed by telephone to Rachel Carlson, as will any additional recommendations warranted by these results. Rachel Carlson will  receive a summary of her genetic counseling visit and a copy of her results once available. This information will also be  available in [place]. We encouraged Rachel Carlson to remain in contact with cancer genetics annually so that we can continuously update the family history and inform her of any changes in cancer genetics and testing that may be of benefit for her family. Rachel Carlson questions were answered to her satisfaction today. Our contact information was provided should additional questions or concerns arise.  Lastly, we encouraged Rachel Carlson to remain in contact with cancer genetics annually so that we can continuously update the family history and inform her of any changes in cancer genetics and testing that may be of benefit for this family.   Ms.  Carlson questions were answered to her satisfaction today. Our contact information was provided should additional questions or concerns arise. Thank you for the referral and allowing [place] to share in the care of your patient.   Dejan Angert P. [place], [facility] Certified Genetic Counselor [email or web address] phone: [phone]  The patient was seen for a total of 45 minutes in face-to-face genetic counseling.  This patient was discussed with Drs. Magrinat, Lindi Adie and/or Burr Medico who agrees with the above.    _______________________________________________________________________ For Office Staff:  Number of people involved in session: 1 Was an Intern/ student involved with case: no

## 2017-09-26 ENCOUNTER — Ambulatory Visit: Payer: Self-pay | Admitting: Genetic Counselor

## 2017-09-26 ENCOUNTER — Encounter: Payer: Self-pay | Admitting: Genetic Counselor

## 2017-09-26 ENCOUNTER — Telehealth: Payer: Self-pay | Admitting: Genetic Counselor

## 2017-09-26 DIAGNOSIS — Z800 Family history of malignant neoplasm of digestive organs: Secondary | ICD-10-CM

## 2017-09-26 DIAGNOSIS — Z1379 Encounter for other screening for genetic and chromosomal anomalies: Secondary | ICD-10-CM

## 2017-09-26 NOTE — Progress Notes (Signed)
HPI:  Rachel Carlson was previously seen in the [place] [place] clinic due to a family history of cancer and concerns regarding a hereditary predisposition to cancer. Please refer to our prior cancer genetics clinic note for more information regarding Rachel Carlson's medical, social and family histories, and our assessment and recommendations, at the time. Rachel Carlson recent genetic test results were disclosed to her, as were recommendations warranted by these results. These results and recommendations are discussed in more detail below.  CANCER HISTORY:   No history exists.    FAMILY HISTORY:  We obtained a detailed, 4-generation family history.  Significant diagnoses are listed below: Family History  Problem Relation Age of Onset  . Sudden death Father 75       "coronary arteriosclerosis" on death certificate  . Heart attack Mother 2  . Transient ischemic attack Mother   . Diabetes type II Mother   . Sudden death Mother        died age 69  . Diabetes Mother   . Coronary artery disease Mother   . Skin cancer Mother   . Arrhythmia Brother   . Colon cancer Brother 69  . Breast cancer Sister        dx in her 40s  . Colon polyps Sister        adenomatous  . Colon cancer Son 73  . Melanoma Maternal Uncle   . Congestive Heart Failure Maternal Grandmother   . Prostate cancer Maternal Grandfather        dx in 4s  . Tuberculosis Paternal Grandfather   . Skin cancer Maternal Aunt   . Other Son        parotid gland tumor  . Ehlers-Danlos syndrome Daughter   . Colon cancer Cousin        mid 31s; maternal cousin  . Colon cancer Other        MGMs brother    The patient has two sons and a daughter.  Her oldest son was diagnosed with colon cancer at 51.  He was told that he has Lynch syndrome.  Her middle son was diagnosed with a parotid salivary gland tumor in his 17's and her daughter has received a diagnosis of ehlers danlos syndrome hypermobility type.  The patient has  two brothers and a sister. One brother was diagnosed with colon cancer at 67 and her sister was diagnosed with breast cancer, and eventually non-hodgkin's lymphoma.  Her sister's daughter was also diagnosed with a connective tissue tumor in her 21's.  The patient's parents are both deceased.  Her father died of a heart attack and her mother from heart disease.  The patient's mother had skin cancer.  She had two sisters and a brother.  One sister also had skin cancer and her brother died of melanoma.  The maternal grandparents are deceased.  The grandfather died of prostate cancer.  The patient's father had two brothers who were cancer free.  The paternal grandparents died of non cancer related issues.  Rachel Carlson is unaware of previous family history of genetic testing for hereditary cancer risks. Patient's maternal ancestors are of [place] descent, and paternal ancestors are of [place] and [place] descent. There is no reported Ashkenazi Jewish ancestry. There is no known consanguinity.  GENETIC TEST RESULTS: Genetic testing reported out on September 25, 2017 through the multi-cancer panel found no deleterious mutations.  The Multi-Gene Panel offered by [facility] includes sequencing and/or deletion duplication testing of the following 84 genes: AIP, ALK,  APC, ATM, AXIN2,BAP1,  BARD1, BLM, BMPR1A, BRCA1, BRCA2, BRIP1, CASR, CDC73, CDH1, CDK4, CDKN1B, CDKN1C, CDKN2A (p14ARF), CDKN2A (p16INK4a), CEBPA, CHEK2, CTNNA1, DICER1, DIS3L2, EGFR (c.2369C>T, p.Thr790Met variant only), EPCAM (Deletion/duplication testing only), FH, FLCN, GATA2, GPC3, GREM1 (Promoter region deletion/duplication testing only), HOXB13 (c.251G>A, p.Gly84Glu), HRAS, KIT, MAX, MEN1, MET, MITF (c.952G>A, p.Glu318Lys variant only), MLH1, MSH2, MSH3, MSH6, MUTYH, NBN, NF1, NF2, NTHL1, PALB2, PDGFRA, PHOX2B, PMS2, POLD1, POLE, POT1, PRKAR1A, PTCH1, PTEN, RAD50, RAD51C, RAD51D, RB1, RECQL4, RET, RUNX1, SDHAF2, SDHA (sequence changes only), SDHB, SDHC,  SDHD, SMAD4, SMARCA4, SMARCB1, SMARCE1, STK11, SUFU, TERC, TERT, TMEM127, TP53, TSC1, TSC2, VHL, WRN and WT1.   The test report has been scanned into EPIC and is located under the Molecular Pathology section of the Results Review tab.    We discussed with Rachel Carlson that since the current genetic testing is not perfect, it is possible there may be a gene mutation in one of these genes that current testing cannot detect, but that chance is small.  We also discussed, that it is possible that another gene that has not yet been discovered, or that we have not yet tested, is responsible for the cancer diagnoses in the family, and it is, therefore, important to remain in touch with cancer genetics in the future so that we can continue to offer Rachel Carlson the most up to date genetic testing.    CANCER SCREENING RECOMMENDATIONS:  Given Rachel Carlson's family history, we must interpret these negative results with some caution.  Families with features suggestive of hereditary risk for cancer tend to have multiple family members with cancer, diagnoses in multiple generations and diagnoses before the age of 50. Rachel Carlson family exhibits some of these features. Thus this result may simply reflect our current inability to detect all mutations within these genes or there may be a different gene that has not yet been discovered or tested.   An individual's cancer risk and medical management are not determined by genetic test results alone. Overall cancer risk assessment incorporates additional factors, including personal medical history, family history, and any available genetic information that may result in a personalized plan for cancer prevention and surveillance.  RECOMMENDATIONS FOR FAMILY MEMBERS:  Women in this family might be at some increased risk of developing cancer, over the general population risk, simply due to the family history of cancer.  We recommended women in this family have a yearly  mammogram beginning at age 40, or 10 years younger than the earliest onset of cancer, an annual clinical breast exam, and perform monthly breast self-exams. Women in this family should also have a gynecological exam as recommended by their primary provider. All family members should have a colonoscopy by age 50.  Based on Rachel Carlson family history, we recommended her son, Trey, who was diagnosed with colon cancer at age 37, have genetic counseling and testing. Rachel Carlson will let us know if we can be of any assistance in coordinating genetic counseling and/or testing for this family member.   FOLLOW-UP: Lastly, we discussed with Rachel Carlson that cancer genetics is a rapidly advancing field and it is possible that new genetic tests will be appropriate for her and/or her family members in the future. We encouraged her to remain in contact with cancer genetics on an annual basis so we can update her personal and family histories and let her know of advances in cancer genetics that may benefit this family.   Our contact number was provided. Rachel Carlson's questions were   answered to her satisfaction, and she knows she is welcome to call us at anytime with additional questions or concerns.    , MS, CGC Certified Genetic Counselor [email or web address]  

## 2017-09-26 NOTE — Telephone Encounter (Signed)
Revealed negative genetic testing.  Discussed that we do not knowwhy there is cancer in the family. It could be due to a different gene that we are not testing, or maybe our current technology may not be able to pick something up.  It will be important for her to keep in contact with genetics to keep up with whether additional testing may be needed.  We recommended that her son, Lytle Michaels, who was diagnosed with colon cancer at 2 consider genetic testing.

## 2017-10-31 DIAGNOSIS — D485 Neoplasm of uncertain behavior of skin: Secondary | ICD-10-CM | POA: Diagnosis not present

## 2017-10-31 DIAGNOSIS — R208 Other disturbances of skin sensation: Secondary | ICD-10-CM | POA: Diagnosis not present

## 2017-10-31 DIAGNOSIS — M25561 Pain in right knee: Secondary | ICD-10-CM | POA: Diagnosis not present

## 2017-10-31 DIAGNOSIS — M1711 Unilateral primary osteoarthritis, right knee: Secondary | ICD-10-CM | POA: Diagnosis not present

## 2017-10-31 DIAGNOSIS — B078 Other viral warts: Secondary | ICD-10-CM | POA: Diagnosis not present

## 2017-10-31 DIAGNOSIS — L439 Lichen planus, unspecified: Secondary | ICD-10-CM | POA: Diagnosis not present

## 2017-11-09 DIAGNOSIS — M25561 Pain in right knee: Secondary | ICD-10-CM | POA: Diagnosis not present

## 2017-11-26 DIAGNOSIS — G8918 Other acute postprocedural pain: Secondary | ICD-10-CM | POA: Diagnosis not present

## 2017-11-26 DIAGNOSIS — M958 Other specified acquired deformities of musculoskeletal system: Secondary | ICD-10-CM | POA: Diagnosis not present

## 2017-11-26 DIAGNOSIS — M23321 Other meniscus derangements, posterior horn of medial meniscus, right knee: Secondary | ICD-10-CM | POA: Diagnosis not present

## 2017-12-06 ENCOUNTER — Encounter: Payer: Self-pay | Admitting: Family Medicine

## 2017-12-06 ENCOUNTER — Ambulatory Visit (INDEPENDENT_AMBULATORY_CARE_PROVIDER_SITE_OTHER): Payer: Medicare Other | Admitting: Family Medicine

## 2017-12-06 VITALS — BP 126/82 | HR 76 | Temp 98.0°F | Wt 196.5 lb

## 2017-12-06 DIAGNOSIS — E6609 Other obesity due to excess calories: Secondary | ICD-10-CM | POA: Insufficient documentation

## 2017-12-06 DIAGNOSIS — R7303 Prediabetes: Secondary | ICD-10-CM | POA: Diagnosis not present

## 2017-12-06 DIAGNOSIS — Z6837 Body mass index (BMI) 37.0-37.9, adult: Secondary | ICD-10-CM | POA: Insufficient documentation

## 2017-12-06 DIAGNOSIS — E669 Obesity, unspecified: Secondary | ICD-10-CM | POA: Diagnosis not present

## 2017-12-06 DIAGNOSIS — Z23 Encounter for immunization: Secondary | ICD-10-CM | POA: Diagnosis not present

## 2017-12-06 LAB — POCT CBG (FASTING - GLUCOSE)-MANUAL ENTRY: Glucose Fasting, POC: 112 mg/dL — AB (ref 70–99)

## 2017-12-06 NOTE — Progress Notes (Signed)
Subjective:    Patient ID: Rachel Carlson, female    DOB: [birth year], 72 y.o.   MRN: [id number]  HPI  Here to discuss blood sugar concerns  Wt Readings from Last 3 Encounters:  12/06/17 196 lb 8 oz (89.1 kg)  09/02/17 199 lb (90.3 kg)  07/15/17 202 lb 8 oz (91.9 kg)   35.37 kg/m  Finger stick glucose before knee surg -fasting 121 A1C was 6.2   Last fasting glucose here in June was 114 A1C was also 6.2  She rarely gets junk food Eating a lot of veg from the garden   Sweets - difficult for her / she thinks she can get away from them  Sugar drinks (has diet soda) , hot tea art sweetener  (no sugar drinks)  Cereal - eats higher fiber cereals  Bread -sandwiches (would like to try low carb flat bread) , some english muffins Crackers- fair amt / chips  Rice -not a lot  Pasta -2 times per month   She knows how to count carbs and count fiber    Knee surgery- for meniscal tear  Recovering  Still using crutches  Has not eaten today  Is doing PT   Since last time has cut portions   Results for orders placed or performed in visit on 12/06/17  Glucose (CBG), Fasting  Result Value Ref Range   Glucose Fasting, POC 112 (A) 70 - 99 mg/dL   today -fasting    Patient Active Problem List   Diagnosis Date Noted  . Obesity (BMI 30-39.9) 12/06/2017  . Genetic testing 09/26/2017  . Family history of breast cancer   . Family history of colon cancer   . Family history of prostate cancer   . Family history of melanoma   . Family history of Lynch syndrome - son 08/04/2017  . Prediabetes 07/20/2015  . Estrogen deficiency 04/27/2015  . GERD (gastroesophageal reflux disease) 03/23/2015  . Hx of adenomatous polyp of colon 10/19/2014  . 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 Pain in joint, pelvic region and thigh 07/24/2014  . Need for hepatitis C screening test 07/12/2014  . Class 2 severe obesity due to excess calories with serious comorbidity and body mass index  (BMI) of 36.0 to 36.9 in adult ([place]) 07/12/2014  . Pelvic pain in female 07/12/2014  . Vitamin D deficiency 07/03/2014  . Encounter for Medicare annual wellness exam 07/08/2013  . Osteoporosis 05/24/2013  . 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 Adnexal pain 11/15/2010  . Urge incontinence 11/15/2010  . Family history of coronary artery disease 10/15/2010  . Routine general medical examination at a health care facility 06/01/2010  . [place], [place] 06/06/2009  . Hyperlipidemia 06/03/2007  . Urge urinary incontinence 06/03/2007  . MIGRAINES, HX OF 06/03/2007   Past Medical History:  Diagnosis Date  . Blood transfusion without reported diagnosis   . Chronic right ear pain    normal MRI  . Esophageal stricture   . Family history of breast cancer   . Family history of colon cancer   . Family history of melanoma   . Family history of prostate cancer   . Fibroids    uterine, history of  . GERD (gastroesophageal reflux disease)   . History of uterine prolapse   . Hx of adenomatous polyp of colon 10/19/2014  . Hyperlipidemia   . Obesity   . PVC (premature ventricular contraction)    first dx by  holter in 1980's, echo (6/10) EF 81-19%, normal diastolic fxn, normal size RV and fxn, mild MR, PASP 51mHg  . Tubal pregnancy   . [place]   . Urge urinary incontinence 06/03/2007   Qualifier: Diagnosis of  By: BMarcelino ScotCMA, AAuburn Bilberry    Past Surgical History:  Procedure Laterality Date  . BLADDER NECK SUSPENSION  1998  . BREAST BIOPSY Left   . COLONOSCOPY  05/18/2011   Dr. CSilvano Rusk . ECTOPIC PREGNANCY SURGERY  1980  . ESOPHAGOGASTRODUODENOSCOPY (EGD) WITH ESOPHAGEAL DILATION  04/03/12   Dr. CSilvano Rusk . PARTIAL HYSTERECTOMY     abdominal, fibroids, and prolapse (1998) bladder tack  . UPPER GASTROINTESTINAL ENDOSCOPY     Social History   Tobacco Use  . Smoking status: Never Smoker  . Smokeless  tobacco: Never Used  Substance Use Topics  . Alcohol use: No    Alcohol/week: 0.0 standard drinks    Comment: rarely beer  . Drug use: No   Family History  Problem Relation Age of Onset  . Sudden death Father 461      "coronary arteriosclerosis" on death certificate  . Heart attack Mother 748 . Transient ischemic attack Mother   . Diabetes type II Mother   . Sudden death Mother        died age 70 . Diabetes Mother   . Coronary artery disease Mother   . Skin cancer Mother   . Arrhythmia Brother   . Colon cancer Brother 311 . Breast cancer Sister        dx in her 617s . Colon polyps Sister        adenomatous  . Colon cancer Son 322 . Melanoma Maternal Uncle   . Congestive Heart Failure Maternal Grandmother   . Prostate cancer Maternal Grandfather        dx in 778s . Tuberculosis Paternal Grandfather   . Skin cancer Maternal Aunt   . Other Son        parotid gland tumor  . Ehlers-Danlos syndrome Daughter   . Colon cancer Cousin        mid 583s maternal cousin  . Colon cancer Other        MGMs brother   Allergies  Allergen Reactions  . Pantoprazole Sodium     Abdominal pain, diarrhea  . Ranitidine     Diarrhea, abdominal pain  . Omeprazole Diarrhea    Abdominal pain also   . Sulfasalazine Rash    Fever, chills, headache, muscle pain   Current Outpatient Medications on File Prior to Visit  Medication Sig Dispense Refill  . aspirin 81 MG tablet Take 81 mg by mouth daily.      . calcium citrate-vitamin D (CITRACAL+D) 315-200 MG-UNIT per tablet Take 1 tablet by mouth 2 (two) times daily.     . Cholecalciferol (VITAMIN D-3) 5000 UNITS TABS 3,000 Units. 1 tablet by mouth on Monday,Wednesday and Friday    . Coenzyme Q10 (CO Q 10 PO) Take 400 Units by mouth daily.      . COLOCORT 100 MG/60ML enema PLACE ONE ENEMA RECTALLY EVERY DAY AT BEDTIME 1680 mL 0  . Cranberry 300 MG tablet Take 300 mg by mouth daily.      . folic acid (FOLVITE) 1 MG tablet Take 1 tablet (1 mg  total) by mouth daily. 30 tablet 11  . Glucosamine-Chondroitin (GLUCOSAMINE CHONDR COMPLEX PO) Take 2 capsules by mouth daily.     .Marland Kitchen  Grape Seed Extract 100 MG CAPS Take 1 capsule by mouth daily.     . hydrocortisone (ANUSOL-HC) 2.5 % rectal cream Place 1 application rectally 2 (two) times daily. 30 g 1  . hydrocortisone (CORTENEMA) 100 MG/60ML enema Place 1 enema (100 mg total) rectally at bedtime as needed. 30 enema 0  . lansoprazole (PREVACID) 15 MG capsule Take 1 capsule (15 mg total) by mouth 2 (two) times daily before a meal.    . LIALDA 1.2 g EC tablet TAKE TWO TABLETS BY MOUTH TWICE DAILY 120 tablet 5  . Omega-3 Fatty Acids (FISH OIL TRIPLE STRENGTH) 1400 MG CAPS Take by mouth daily.      . raloxifene (EVISTA) 60 MG tablet Take 1 tablet (60 mg total) by mouth daily. 90 tablet 3  . rosuvastatin (CRESTOR) 20 MG tablet TAKE 1 TABLET BY MOUTH AT BEDTIME 90 tablet 3  . VITAMIN B COMPLEX-C CAPS Take 1 capsule by mouth daily.      No current facility-administered medications on file prior to visit.      Review of Systems  Constitutional: Negative for activity change, appetite change, fatigue, fever and unexpected weight change.  HENT: Negative for congestion, ear pain, rhinorrhea, sinus pressure and sore throat.   Eyes: Negative for pain, redness and visual disturbance.  Respiratory: Negative for cough, shortness of breath and wheezing.   Cardiovascular: Negative for chest pain and palpitations.  Gastrointestinal: Negative for abdominal pain, blood in stool, constipation and diarrhea.  Endocrine: Negative for polydipsia, polyphagia and polyuria.  Genitourinary: Negative for dysuria, frequency and urgency.  Musculoskeletal: Negative for arthralgias, back pain and myalgias.  Skin: Negative for pallor and rash.  Allergic/Immunologic: Negative for environmental allergies.  Neurological: Negative for dizziness, syncope and headaches.  Hematological: Negative for adenopathy. Does not  bruise/bleed easily.  Psychiatric/Behavioral: Negative for decreased concentration and dysphoric mood. The patient is not nervous/anxious.        Objective:   Physical Exam  Constitutional: She appears well-developed and well-nourished. No distress.  obese and well appearing   HENT:  Head: Normocephalic and atraumatic.  Mouth/Throat: Oropharynx is clear and moist.  Eyes: Pupils are equal, round, and reactive to light. Conjunctivae and EOM are normal.  Neck: Normal range of motion. Neck supple. No JVD present. Carotid bruit is not present. No thyromegaly present.  Cardiovascular: Normal rate, regular rhythm, normal heart sounds and intact distal pulses. Exam reveals no gallop.  Pulmonary/Chest: Effort normal and breath sounds normal. No respiratory distress. She has no wheezes. She has no rales.  No crackles  Abdominal: Soft. Bowel sounds are normal. She exhibits no distension, no abdominal bruit and no mass. There is no tenderness.  Musculoskeletal: She exhibits no edema.  Lymphadenopathy:    She has no cervical adenopathy.  Neurological: She is alert. She has normal reflexes.  Skin: Skin is warm and dry. No rash noted.  Psychiatric: She has a normal mood and affect.          Assessment & Plan:   Problem List Items Addressed This Visit      Other   Obesity (BMI 30-39.9)    Discussed how this problem influences overall health and the risks it imposes  Reviewed plan for weight loss with lower calorie diet (via better food choices and also portion control or program like weight watchers) and exercise building up to or more than 30 minutes 5 days per week including some aerobic activity         Relevant Orders  Glucose (CBG), Fasting (Completed)   Prediabetes - Primary    Lab Results  Component Value Date   HGBA1C 6.2 07/09/2017   Glucose 112 fasting today  Obese No fam hx of diabetes Disc low glycemic diet  Handouts given  Also plan for exercise  Continue to  follow       Relevant Orders   Glucose (CBG), Fasting (Completed)    Other Visit Diagnoses    Need for influenza vaccination       Relevant Orders   Flu Vaccine QUAD 6+ mos PF IM (Fluarix Quad PF) (Completed)

## 2017-12-06 NOTE — Patient Instructions (Addendum)
To prevent diabetes Try to get most of your carbohydrates from produce (with the exception of white potatoes)  Eat less bread/pasta/rice/snack foods/cereals/sweets and other items from the middle of the grocery store (processed carbs) Weight loss helps also   Whole grain is better, but not as good as produce   Make a plan for exercise when you are recovered  Work up to 30 minutes 5 days per week or more   Let's check an A1c in 3 months   Do make sure to eat regularly - folks with prediabetes sometimes get blood sugar also (especially after eating sugar hours before)   Flu shot

## 2017-12-07 NOTE — Assessment & Plan Note (Signed)
Lab Results  Component Value Date   HGBA1C 6.2 07/09/2017   Glucose 112 fasting today  Obese No fam hx of diabetes Disc low glycemic diet  Handouts given  Also plan for exercise  Continue to follow

## 2017-12-07 NOTE — Assessment & Plan Note (Signed)
Discussed how this problem influences overall health and the risks it imposes  Reviewed plan for weight loss with lower calorie diet (via better food choices and also portion control or program like weight watchers) and exercise building up to or more than 30 minutes 5 days per week including some aerobic activity    

## 2018-01-01 ENCOUNTER — Ambulatory Visit: Payer: Medicare Other | Attending: Orthopedic Surgery | Admitting: Physical Therapy

## 2018-01-01 ENCOUNTER — Encounter: Payer: Self-pay | Admitting: Physical Therapy

## 2018-01-01 ENCOUNTER — Other Ambulatory Visit: Payer: Self-pay

## 2018-01-01 DIAGNOSIS — M25561 Pain in right knee: Secondary | ICD-10-CM | POA: Insufficient documentation

## 2018-01-01 DIAGNOSIS — R600 Localized edema: Secondary | ICD-10-CM | POA: Diagnosis not present

## 2018-01-01 NOTE — Therapy (Signed)
[facility] [place], [place], [address] Phone: [phone]   Fax:  [phone]  Physical Therapy Evaluation  Patient Details  Name: Rachel Carlson MRN: [id number] Date of Birth: 15-Jul-1945 Referring Provider (PT): Gaynelle Arabian MD   Encounter Date: 01/01/2018  PT End of Session - 01/01/18 1541    Visit Number  1    Number of Visits  8    Date for PT Re-Evaluation  01/29/18    PT Start Time  1115    PT Stop Time  1207    PT Time Calculation (min)  52 min       Past Medical History:  Diagnosis Date  . Blood transfusion without reported diagnosis   . Chronic right ear pain    normal MRI  . Esophageal stricture   . Family history of breast cancer   . Family history of colon cancer   . Family history of melanoma   . Family history of prostate cancer   . Fibroids    uterine, history of  . GERD (gastroesophageal reflux disease)   . History of uterine prolapse   . Hx of adenomatous polyp of colon 10/19/2014  . Hyperlipidemia   . Obesity   . PVC (premature ventricular contraction)    first dx by holter in 1980's, echo (6/10) EF 71-24%, normal diastolic fxn, normal size RV and fxn, mild MR, PASP 46mHg  . Tubal pregnancy   . Ulcerative [place]osigmoiditis ([place]   . 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 BLADDER NECK SUSPENSION  1998  . BREAST BIOPSY Left   . COLONOSCOPY  05/18/2011   Dr. CSilvano Rusk . ECTOPIC PREGNANCY SURGERY  1980  . ESOPHAGOGASTRODUODENOSCOPY (EGD) WITH ESOPHAGEAL DILATION  04/03/12   Dr. CSilvano Rusk . PARTIAL HYSTERECTOMY     abdominal, fibroids, and prolapse (1998) bladder tack  . UPPER GASTROINTESTINAL ENDOSCOPY      There were no vitals filed for this visit.   Subjective Assessment - 01/01/18 1548    Subjective  The patient underwent right knee arthroscopic surgery on  11/26/17.  She reports continued pain.  Today her pain is rated at a 4/10 but can rise to a bit higher with increased up time.  Rest decreases her pain.  Incidentally, she reports she had to go into her crawl space at home due to a water leak.    Pertinent History  OP    Patient Stated Goals  Get out of pain and back to normal as possible.    Currently in Pain?  Yes    Pain Score  4     Pain Location  Knee    Pain Orientation  Right    Pain Descriptors / Indicators  Throbbing    Pain Type  Surgical pain    Pain Onset  More than a month ago    Pain Frequency  Constant    Aggravating Factors   See above.    Pain Relieving Factors  See above.         [facility] 01[facility]01[facility]18 0001      Assessment   Medical Diagnosis  Tear of medial meniscus, right.    Referring Provider (PT)  FGaynelle ArabianMD    Onset Date/Surgical Date  --   11/26/17 (surgery date).     Precautions   Precautions  --  PAIN-FREE RT QUAD EXERCISES.     Restrictions   Weight Bearing Restrictions  No      Balance Screen   Has the patient fallen in the past 6 months  No    Has the patient had a decrease in activity level because of a fear of falling?   No    Is the patient reluctant to leave their home because of a fear of falling?   No      Home Environment   Living Environment  Private residence      Prior Function   Level of Independence  Independent      Observation/Other Assessments   Observations  Well healed right knee scope sites.      Observation/Other Assessments-Edema    Edema  Circumferential      Circumferential Edema   Circumferential - Right  RT 2 cms > LT.      ROM / Strength   AROM / PROM / Strength  AROM;Strength      AROM   Overall AROM Comments  Full active right knee extension and flexion to 120 degrees.      Strength   Overall Strength Comments  Right hip strength is normal, right knee a solid 4+/5.      Palpation   Palpation comment  Some palpable tenderness over  right knee medial joint line and distal pole of patella.      Ambulation/Gait   Gait Comments  Essentially normal gait cycle.                Objective measurements completed on examination: See above findings.      [facility] Adult PT Treatment/Exercise - 01/01/18 0001      Modalities   Modalities  Electrical Stimulation;Vasopneumatic      Electrical Stimulation   Electrical Stimulation Location  Right knee.    Electrical Stimulation Action  IFC    Electrical Stimulation Parameters  1-10 Hz x 15 minutes.    Electrical Stimulation Goals  Edema;Pain      Vasopneumatic   Number Minutes Vasopneumatic   15 minutes    Vasopnuematic Location   --   Right knee.   Vasopneumatic Pressure  Low                  PT Long Term Goals - 01/01/18 1610      PT LONG TERM GOAL #1   Title  Independent with a HEP.      PT LONG TERM GOAL #2   Title  Perform ADL's with right knee pain not > 2/10.    Time  4    Period  Weeks    Status  New             Plan - 01/01/18 1606    Clinical Impression Statement  The patient underwent a right knee arthroscopic surgery for a torn medial meniscus on 11/26/17.  Her rangeof motion is essentially normal.  She has some remaining edema and some palpable right knee pain.  Patient will benefit from skilled physical therapy intervention to address deficits.      History and Personal Factors relevant to plan of care:  OP.    Clinical Presentation  Evolving    Clinical Decision Making  Low    Rehab Potential  Good    PT Frequency  2x / week    PT Duration  8 weeks    PT Treatment/Interventions  ADLs/Self Care Home Management;Cryotherapy;Electrical Stimulation;Moist Heat;Therapeutic activities;Therapeutic exercise;Neuromuscular re-education;Patient/family  education;Vasopneumatic Device;Manual techniques    PT Next Visit Plan  Right hip sdly abduction and "clamshell"; PAIN-FREE right quad exercises.  e'stim and vaso.    Consulted and Agree  with Plan of Care  Patient       Patient will benefit from skilled therapeutic intervention in order to improve the following deficits and impairments:  Pain, Decreased strength, Increased edema  Visit Diagnosis: Acute pain of right knee - Plan: PT plan of care cert/re-cert  Localized edema - Plan: PT plan of care cert/re-cert     Problem List Patient Active Problem List   Diagnosis Date Noted  . Obesity (BMI 30-39.9) 12/06/2017  . Genetic testing 09/26/2017  . Family history of breast cancer   . Family history of colon cancer   . Family history of prostate cancer   . Family history of melanoma   . Family history of Lynch syndrome - son 08/04/2017  . Prediabetes 07/20/2015  . Estrogen deficiency 04/27/2015  . GERD (gastroesophageal reflux disease) 03/23/2015  . Hx of adenomatous polyp of colon 10/19/2014  . 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 Pain in joint, pelvic region and thigh 07/24/2014  . Need for hepatitis C screening test 07/12/2014  . Class 2 severe obesity due to excess calories with serious comorbidity and body mass index (BMI) of 36.0 to 36.9 in adult ([place]) 07/12/2014  . Pelvic pain in female 07/12/2014  . Vitamin D deficiency 07/03/2014  . Encounter for Medicare annual wellness exam 07/08/2013  . Osteoporosis 05/24/2013  . 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 Adnexal pain 11/15/2010  . Urge incontinence 11/15/2010  . Family history of coronary artery disease 10/15/2010  . Routine general medical examination at a health care facility 06/01/2010  . [place], [place] 06/06/2009  . Hyperlipidemia 06/03/2007  . Urge urinary incontinence 06/03/2007  . MIGRAINES, HX OF 06/03/2007    Zemirah Krasinski, [name] 01/01/2018, 4:13 PM  [facility] [address] [place], [place], [address] Phone: [phone]   Fax:  [phone]  Name: Rachel Carlson MRN:  [id number] Date of Birth: [birth year]

## 2018-01-03 ENCOUNTER — Ambulatory Visit: Payer: Medicare Other | Admitting: *Deleted

## 2018-01-03 DIAGNOSIS — M25561 Pain in right knee: Secondary | ICD-10-CM | POA: Diagnosis not present

## 2018-01-03 DIAGNOSIS — R600 Localized edema: Secondary | ICD-10-CM

## 2018-01-03 NOTE — Therapy (Signed)
[facility] [place], [place], [address] Phone: [phone]   Fax:  [phone]  Physical Therapy Treatment  Patient Details  Name: Rachel Carlson MRN: [id number] Date of Birth: 03/02/1945 Referring Provider (PT): Gaynelle Arabian MD   Encounter Date: 01/03/2018  PT End of Session - 01/03/18 0824    Visit Number  2    Number of Visits  8    Date for PT Re-Evaluation  01/29/18    PT Start Time  0815    PT Stop Time  0910    PT Time Calculation (min)  55 min       Past Medical History:  Diagnosis Date  . Blood transfusion without reported diagnosis   . Chronic right ear pain    normal MRI  . Esophageal stricture   . Family history of breast cancer   . Family history of colon cancer   . Family history of melanoma   . Family history of prostate cancer   . Fibroids    uterine, history of  . GERD (gastroesophageal reflux disease)   . History of uterine prolapse   . Hx of adenomatous polyp of colon 10/19/2014  . Hyperlipidemia   . Obesity   . PVC (premature ventricular contraction)    first dx by holter in 1980's, echo (6/10) EF 45-03%, normal diastolic fxn, normal size RV and fxn, mild MR, PASP 67mHg  . Tubal pregnancy   . Ulcerative proctosigmoiditis ([facility]   . 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 BLADDER NECK SUSPENSION  1998  . BREAST BIOPSY Left   . COLONOSCOPY  05/18/2011   Dr. CSilvano Rusk . ECTOPIC PREGNANCY SURGERY  1980  . ESOPHAGOGASTRODUODENOSCOPY (EGD) WITH ESOPHAGEAL DILATION  04/03/12   Dr. CSilvano Rusk . PARTIAL HYSTERECTOMY     abdominal, fibroids, and prolapse (1998) bladder tack  . UPPER GASTROINTESTINAL ENDOSCOPY      There were no vitals filed for this visit.  Subjective Assessment - 01/03/18 0823    Subjective  RT knee sore with walking    Pertinent History  OP    Patient Stated Goals  Get out of pain and back to normal as possible.    Currently in Pain?  Yes    Pain Score  4     Pain Orientation  Right    Pain Descriptors / Indicators  Throbbing    Pain Type  Surgical pain    Pain Onset  More than a month ago    Pain Frequency  Constant                       [facility] Adult PT Treatment/Exercise - 01/03/18 0001      Exercises   Exercises  Knee/Hip      Knee/Hip Exercises: Aerobic   Stationary Bike  L1 seat 5 x  11 min       Knee/Hip Exercises: Standing   Rocker Board  3 minutes      Knee/Hip Exercises: Seated   Long Arc Quad  3 sets;10 reps;Strengthening    Long Arc Quad Weight  2 lbs.      Knee/Hip Exercises: Supine   Quad Sets  AROM;Right;1 set;10 reps    Straight Leg Raises  --      Modalities   Modalities  Electrical Stimulation;Vasopneumatic  Acupuncturist Location  Right knee.  IFC 1-10Hz    x 34mns    Electrical Stimulation Goals  Edema;Pain      Vasopneumatic   Number Minutes Vasopneumatic   15 minutes    Vasopnuematic Location   --   Right knee.   Vasopneumatic Pressure  Low    Vasopneumatic Temperature   36      Manual Therapy   Manual Therapy  Passive ROM    Passive ROM  Medial patella  glides                  PT Long Term Goals - 01/01/18 1610      PT LONG TERM GOAL #1   Title  Independent with a HEP.      PT LONG TERM GOAL #2   Title  Perform ADL's with right knee pain not > 2/10.    Time  4    Period  Weeks    Status  New            Plan - 01/03/18 06568   Clinical Impression Statement  Pt arrived today doingabout the same with RT knee pain. She reports pain with walking and throbbing at night. She was able to perform the bike today with minimal increase in discomfort as well as start LAQs with a 2# weight. Notable soreness noted lataral aspect of patella during patella mobs. Normal modality response today    Clinical Presentation   Evolving    Clinical Decision Making  Low    Rehab Potential  Good    PT Frequency  2x / week    PT Duration  8 weeks    PT Treatment/Interventions  ADLs/Self Care Home Management;Cryotherapy;Electrical Stimulation;Moist Heat;Therapeutic activities;Therapeutic exercise;Neuromuscular re-education;Patient/family education;Vasopneumatic Device;Manual techniques    PT Next Visit Plan  Right hip sdly abduction and "clamshell"; PAIN-FREE right quad exercises.  e'stim and vaso.    Consulted and Agree with Plan of Care  Patient       Patient will benefit from skilled therapeutic intervention in order to improve the following deficits and impairments:  Pain, Decreased strength, Increased edema  Visit Diagnosis: Acute pain of right knee  Localized edema     Problem List Patient Active Problem List   Diagnosis Date Noted  . Obesity (BMI 30-39.9) 12/06/2017  . Genetic testing 09/26/2017  . Family history of breast cancer   . Family history of colon cancer   . Family history of prostate cancer   . Family history of melanoma   . Family history of Lynch syndrome - son 08/04/2017  . Prediabetes 07/20/2015  . Estrogen deficiency 04/27/2015  . GERD (gastroesophageal reflux disease) 03/23/2015  . Hx of adenomatous polyp of colon 10/19/2014  . Ulcerative proctosigmoiditis ([place] 10/12/2014  . Pain in joint, pelvic region and thigh 07/24/2014  . Need for hepatitis C screening test 07/12/2014  . Class 2 severe obesity due to excess calories with serious comorbidity and body mass index (BMI) of 36.0 to 36.9 in adult ([place] 07/12/2014  . Pelvic pain in female 07/12/2014  . Vitamin D deficiency 07/03/2014  . Encounter for Medicare annual wellness exam 07/08/2013  . Osteoporosis 05/24/2013  . 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 Adnexal pain 11/15/2010  . Urge incontinence 11/15/2010  . Family history of coronary artery disease 10/15/2010  .  Routine general medical examination at a health care facility 06/01/2010  . FASCIITIS,  PLANTAR 06/06/2009  . Hyperlipidemia 06/03/2007  . Urge urinary incontinence 06/03/2007  . MIGRAINES, HX OF 06/03/2007    Jissell Trafton,[name], PTA 01/03/2018, 9:13 AM  [facility] [place], [place], [address] Phone: [phone]   Fax:  [phone]  Name: FREDRICKA KOHRS MRN: [id number] Date of Birth: 01/17/1946

## 2018-01-07 ENCOUNTER — Ambulatory Visit: Payer: Medicare Other | Admitting: *Deleted

## 2018-01-07 DIAGNOSIS — R600 Localized edema: Secondary | ICD-10-CM | POA: Diagnosis not present

## 2018-01-07 DIAGNOSIS — M25561 Pain in right knee: Secondary | ICD-10-CM | POA: Diagnosis not present

## 2018-01-07 NOTE — Therapy (Signed)
[facility] [place], [place], [address] Phone: [phone]   Fax:  [phone]  Physical Therapy Treatment  Patient Details  Name: Rachel Carlson MRN: [id number] Date of Birth: [birth year] Referring Provider (PT): Gaynelle Arabian MD   Encounter Date: 01/07/2018  PT End of Session - 01/07/18 1043    Visit Number  3    Number of Visits  8    Date for PT Re-Evaluation  01/29/18    PT Start Time  1030    PT Stop Time  1123    PT Time Calculation (min)  53 min       Past Medical History:  Diagnosis Date  . Blood transfusion without reported diagnosis   . Chronic right ear pain    normal MRI  . Esophageal stricture   . Family history of breast cancer   . Family history of colon cancer   . Family history of melanoma   . Family history of prostate cancer   . Fibroids    uterine, history of  . GERD (gastroesophageal reflux disease)   . History of uterine prolapse   . Hx of adenomatous polyp of colon 10/19/2014  . Hyperlipidemia   . Obesity   . PVC (premature ventricular contraction)    first dx by holter in 1980's, echo (6/10) EF 63-78%, normal diastolic fxn, normal size RV and fxn, mild MR, PASP 35mHg  . Tubal pregnancy   . Ulcerative proctosigmoiditis ([place]   . 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 BLADDER NECK SUSPENSION  1998  . BREAST BIOPSY Left   . COLONOSCOPY  05/18/2011   Dr. CSilvano Rusk . ECTOPIC PREGNANCY SURGERY  1980  . ESOPHAGOGASTRODUODENOSCOPY (EGD) WITH ESOPHAGEAL DILATION  04/03/12   Dr. CSilvano Rusk . PARTIAL HYSTERECTOMY     abdominal, fibroids, and prolapse (1998) bladder tack  . UPPER GASTROINTESTINAL ENDOSCOPY      There were no vitals filed for this visit.  Subjective Assessment - 01/07/18 1036    Subjective  My walking RT knee pain  7-8/10,     Pertinent History   OP    Patient Stated Goals  Get out of pain and back to normal as possible.    Currently in Pain?  Yes    Pain Score  7    WB pain   Pain Location  Knee    Pain Orientation  Right    Pain Descriptors / Indicators  Throbbing    Pain Type  Surgical pain    Pain Onset  More than a month ago    Pain Frequency  Constant                       [facility] Adult PT Treatment/Exercise - 01/07/18 0001      Exercises   Exercises  Knee/Hip      Knee/Hip Exercises: Aerobic   Stationary Bike  --    Nustep  L5  seat 8 x 10 mins UE/LEs x 6 mins LEs only L2      Knee/Hip Exercises: Standing   Rocker Board  3 minutes      Knee/Hip Exercises: Seated   Long Arc Quad  10 reps;Strengthening;4 sets    Long Arc Quad Weight  2 lbs.      Modalities   Modalities  Electrical Stimulation;Vasopneumatic  Acupuncturist Location  Right knee.  IFC 1-10Hz    x 66mns    Electrical Stimulation Goals  Edema;Pain      Vasopneumatic   Number Minutes Vasopneumatic   15 minutes    Vasopnuematic Location   Knee    Vasopneumatic Pressure  Low    Vasopneumatic Temperature   36      Manual Therapy   Manual Therapy  Passive ROM    Passive ROM  Medial patella  glides                  PT Long Term Goals - 01/01/18 1610      PT LONG TERM GOAL #1   Title  Independent with a HEP.      PT LONG TERM GOAL #2   Title  Perform ADL's with right knee pain not > 2/10.    Time  4    Period  Weeks    Status  New            Plan - 01/07/18 1123    Clinical Impression Statement  Pt arrived today doing about the same with pain in RT knee. Her CC is pain during WB and discomfort at night. Pt was able to perform the nustep today with and without UE assist. She was also able to perform LAQs with 2# wt without complaints. Normal modality response today.    Clinical Presentation  Evolving    Clinical Decision Making  Low    Rehab Potential  Good    PT  Frequency  2x / week    PT Duration  8 weeks    PT Treatment/Interventions  ADLs/Self Care Home Management;Cryotherapy;Electrical Stimulation;Moist Heat;Therapeutic activities;Therapeutic exercise;Neuromuscular re-education;Patient/family education;Vasopneumatic Device;Manual techniques    PT Next Visit Plan  Right hip sdly abduction and "clamshell"; PAIN-FREE right quad exercises.  e'stim and vaso.       Patient will benefit from skilled therapeutic intervention in order to improve the following deficits and impairments:  Pain, Decreased strength, Increased edema  Visit Diagnosis: Acute pain of right knee  Localized edema     Problem List Patient Active Problem List   Diagnosis Date Noted  . Obesity (BMI 30-39.9) 12/06/2017  . Genetic testing 09/26/2017  . Family history of breast cancer   . Family history of colon cancer   . Family history of prostate cancer   . Family history of melanoma   . Family history of Lynch syndrome - son 08/04/2017  . Prediabetes 07/20/2015  . Estrogen deficiency 04/27/2015  . GERD (gastroesophageal reflux disease) 03/23/2015  . Hx of adenomatous polyp of colon 10/19/2014  . Ulcerative proctosigmoiditis ([place] 10/12/2014  . Pain in joint, pelvic region and thigh 07/24/2014  . Need for hepatitis C screening test 07/12/2014  . Class 2 severe obesity due to excess calories with serious comorbidity and body mass index (BMI) of 36.0 to 36.9 in adult ([place] 07/12/2014  . Pelvic pain in female 07/12/2014  . Vitamin D deficiency 07/03/2014  . Encounter for Medicare annual wellness exam 07/08/2013  . Osteoporosis 05/24/2013  . 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 Adnexal pain 11/15/2010  . Urge incontinence 11/15/2010  . Family history of coronary artery disease 10/15/2010  . Routine general medical examination at a health care facility 06/01/2010  . [place]  . Hyperlipidemia  06/03/2007  . Urge urinary incontinence 06/03/2007  . MIGRAINES, HX OF 06/03/2007  Yusef Lamp,[name], PTA 01/07/2018, 12:11 PM  [facility] [address] [place], [place], [address] Phone: [phone]   Fax:  [phone]  Name: Rachel Carlson MRN: [id number] Date of Birth: Nov 23, 1945

## 2018-01-09 ENCOUNTER — Ambulatory Visit: Payer: Medicare Other | Admitting: *Deleted

## 2018-01-09 DIAGNOSIS — M25561 Pain in right knee: Secondary | ICD-10-CM

## 2018-01-09 DIAGNOSIS — R600 Localized edema: Secondary | ICD-10-CM | POA: Diagnosis not present

## 2018-01-09 NOTE — Therapy (Signed)
[facility] [place], [place], [address] Phone: [phone]   Fax:  [phone]  Physical Therapy Treatment  Patient Details  Name: Rachel Carlson MRN: [id number] Date of Birth: 04/23/1945 Referring Provider (PT): Gaynelle Arabian MD   Encounter Date: 01/09/2018  PT End of Session - 01/09/18 1053    Visit Number  4    Number of Visits  8    Date for PT Re-Evaluation  01/29/18    PT Start Time  1030    PT Stop Time  1122    PT Time Calculation (min)  52 min       Past Medical History:  Diagnosis Date  . Blood transfusion without reported diagnosis   . Chronic right ear pain    normal MRI  . Esophageal stricture   . Family history of breast cancer   . Family history of colon cancer   . Family history of melanoma   . Family history of prostate cancer   . Fibroids    uterine, history of  . GERD (gastroesophageal reflux disease)   . History of uterine prolapse   . Hx of adenomatous polyp of colon 10/19/2014  . Hyperlipidemia   . Obesity   . PVC (premature ventricular contraction)    first dx by holter in 1980's, echo (6/10) EF 09-47%, normal diastolic fxn, normal size RV and fxn, mild MR, PASP 75mHg  . Tubal pregnancy   . Ulcerative proctosigmoiditis ([place]   . 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 BLADDER NECK SUSPENSION  1998  . BREAST BIOPSY Left   . COLONOSCOPY  05/18/2011   Dr. CSilvano Rusk . ECTOPIC PREGNANCY SURGERY  1980  . ESOPHAGOGASTRODUODENOSCOPY (EGD) WITH ESOPHAGEAL DILATION  04/03/12   Dr. CSilvano Rusk . PARTIAL HYSTERECTOMY     abdominal, fibroids, and prolapse (1998) bladder tack  . UPPER GASTROINTESTINAL ENDOSCOPY      There were no vitals filed for this visit.  Subjective Assessment - 01/09/18 1047    Subjective  Using crutches today due to sharp pain RT knee when  changing directions. To MD next Tuesday.    Pertinent History  OP    Patient Stated Goals  Get out of pain and back to normal as possible.    Currently in Pain?  Yes    Pain Score  7     Pain Location  Knee    Pain Orientation  Right    Pain Descriptors / Indicators  Throbbing    Pain Onset  More than a month ago    Pain Frequency  Constant                       [facility] Adult PT Treatment/Exercise - 01/09/18 0001      Exercises   Exercises  Knee/Hip      Knee/Hip Exercises: Aerobic   Stationary Bike  L1 seat 5 x  20  min       Knee/Hip Exercises: Standing   Rocker Board  3 minutes      Knee/Hip Exercises: Seated   Long Arc Quad  10 reps;Strengthening;3 sets    Long Arc Quad Weight  2 lbs.      Modalities   Modalities  EPsychologist, educationalLocation  Right  knee.  IFC 1-10Hz    x 71mns    Electrical Stimulation Goals  Edema;Pain      Vasopneumatic   Number Minutes Vasopneumatic   15 minutes    Vasopnuematic Location   Knee    Vasopneumatic Pressure  Low    Vasopneumatic Temperature   36      Manual Therapy   Manual Therapy  Passive ROM    Passive ROM  manual traction to RT LE for joint decrompression                  PT Long Term Goals - 01/01/18 1610      PT LONG TERM GOAL #1   Title  Independent with a HEP.      PT LONG TERM GOAL #2   Title  Perform ADL's with right knee pain not > 2/10.    Time  4    Period  Weeks    Status  New            Plan - 01/09/18 1104    Clinical Impression Statement  Pt arrived today using bil. crutches for gait and reports having a stabbing pain yesterday when changing directions. She was able to perform exs today as before, but, very sore along jt. line posterior aspect and lateral. She will F/U next Tuesday with MD and will need MD note next visit. Normal modality response today    Clinical Presentation  Evolving    Rehab  Potential  Good    PT Duration  8 weeks    PT Treatment/Interventions  ADLs/Self Care Home Management;Cryotherapy;Electrical Stimulation;Moist Heat;Therapeutic activities;Therapeutic exercise;Neuromuscular re-education;Patient/family education;Vasopneumatic Device;Manual techniques    PT Next Visit Plan  Right hip sdly abduction and "clamshell"; PAIN-FREE right quad exercises.  e'stim and vaso.   ADD [place] MD NOTE for approval please.    Consulted and Agree with Plan of Care  Patient       Patient will benefit from skilled therapeutic intervention in order to improve the following deficits and impairments:  Pain, Decreased strength, Increased edema  Visit Diagnosis: Acute pain of right knee  Localized edema     Problem List Patient Active Problem List   Diagnosis Date Noted  . Obesity (BMI 30-39.9) 12/06/2017  . Genetic testing 09/26/2017  . Family history of breast cancer   . Family history of colon cancer   . Family history of prostate cancer   . Family history of melanoma   . Family history of Lynch syndrome - son 08/04/2017  . Prediabetes 07/20/2015  . Estrogen deficiency 04/27/2015  . GERD (gastroesophageal reflux disease) 03/23/2015  . Hx of adenomatous polyp of colon 10/19/2014  . 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 Pain in joint, pelvic region and thigh 07/24/2014  . Need for hepatitis C screening test 07/12/2014  . Class 2 severe obesity due to excess calories with serious comorbidity and body mass index (BMI) of 36.0 to 36.9 in adult ([place] 07/12/2014  . Pelvic pain in female 07/12/2014  . Vitamin D deficiency 07/03/2014  . Encounter for Medicare annual wellness exam 07/08/2013  . Osteoporosis 05/24/2013  . 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 Adnexal pain 11/15/2010  . Urge incontinence 11/15/2010  . Family history of coronary artery disease 10/15/2010  . Routine general medical examination at a  health care facility 06/01/2010  . [place]  . Hyperlipidemia 06/03/2007  . Urge urinary incontinence 06/03/2007  . MIGRAINES,  HX OF 06/03/2007    Eppie Barhorst,[name] , PTA 01/09/2018, 2:17 PM  [facility] [place], [place], [address] Phone: [phone]   Fax:  [phone]  Name: Rachel Carlson MRN: [id number] Date of Birth: 1945-03-30

## 2018-01-13 ENCOUNTER — Ambulatory Visit: Payer: Medicare Other | Admitting: Physical Therapy

## 2018-01-13 DIAGNOSIS — R600 Localized edema: Secondary | ICD-10-CM

## 2018-01-13 DIAGNOSIS — M25561 Pain in right knee: Secondary | ICD-10-CM

## 2018-01-13 NOTE — Therapy (Signed)
[facility] [place], [place], [address] Phone: [phone]   Fax:  [phone]  Physical Therapy Treatment  Patient Details  Name: Rachel Carlson MRN: [id number] Date of Birth: 03/16/1945 Referring Provider (PT): Gaynelle Arabian MD   Encounter Date: 01/13/2018  PT End of Session - 01/13/18 1110    Visit Number  5    Number of Visits  8    Date for PT Re-Evaluation  01/29/18    PT Start Time  1030    PT Stop Time  1118    PT Time Calculation (min)  48 min    Activity Tolerance  Patient tolerated treatment well    Behavior During Therapy  [facility] for tasks assessed/performed       Past Medical History:  Diagnosis Date  . Blood transfusion without reported diagnosis   . Chronic right ear pain    normal MRI  . Esophageal stricture   . Family history of breast cancer   . Family history of colon cancer   . Family history of melanoma   . Family history of prostate cancer   . Fibroids    uterine, history of  . GERD (gastroesophageal reflux disease)   . History of uterine prolapse   . Hx of adenomatous polyp of colon 10/19/2014  . Hyperlipidemia   . Obesity   . PVC (premature ventricular contraction)    first dx by holter in 1980's, echo (6/10) EF 69-62%, normal diastolic fxn, normal size RV and fxn, mild MR, PASP 68mHg  . Tubal pregnancy   . Ulcerative proctosigmoiditis ([place]   . 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 BLADDER NECK SUSPENSION  1998  . BREAST BIOPSY Left   . COLONOSCOPY  05/18/2011   Dr. CSilvano Rusk . ECTOPIC PREGNANCY SURGERY  1980  . ESOPHAGOGASTRODUODENOSCOPY (EGD) WITH ESOPHAGEAL DILATION  04/03/12   Dr. CSilvano Rusk . PARTIAL HYSTERECTOMY     abdominal, fibroids, and prolapse (1998) bladder tack  . UPPER GASTROINTESTINAL ENDOSCOPY      There were no vitals filed for this  visit.  Subjective Assessment - 01/13/18 1043    Subjective  Took it easy this weekend.      Pertinent History  OP    Patient Stated Goals  Get out of pain and back to normal as possible.    Currently in Pain?  Yes    Pain Score  5     Pain Location  Knee    Pain Orientation  Right    Pain Descriptors / Indicators  Throbbing    Pain Type  Surgical pain    Pain Onset  More than a month ago                       [facility] - 01/13/18 0001      Exercises   Exercises  Knee/Hip      Knee/Hip Exercises: Aerobic   Stationary Bike  Level 2 x 20 minutes.      Modalities   Modalities  EHealth visitorStimulation Location  Right knee.    Electrical Stimulation Action  IFC    Electrical Stimulation Parameters  80-150 Hz x 15 minutes.    Electrical Stimulation Goals  Edema;Pain  Vasopneumatic   Number Minutes Vasopneumatic   15 minutes    Vasopnuematic Location   --   Right knee.   Vasopneumatic Pressure  Low      Manual Therapy   Manual Therapy  Soft tissue mobilization    Manual therapy comments  Right knee sustained extension stretch while performing right patellar mobs in all directions x 5 minutes.                  PT Long Term Goals - 01/01/18 1610      PT LONG TERM GOAL #1   Title  Independent with a HEP.      PT LONG TERM GOAL #2   Title  Perform ADL's with right knee pain not > 2/10.    Time  4    Period  Weeks    Status  New            Plan - 01/13/18 1109    Clinical Impression Statement  Patient arriving today with some pain reduction though she said she took it easy and used her crutuches.  She had a minimal lack of right knee extension today.    Rehab Potential  Good    PT Frequency  2x / week    PT Duration  8 weeks    PT Treatment/Interventions  ADLs/Self Care Home Management;Cryotherapy;Electrical Stimulation;Moist Heat;Therapeutic  activities;Therapeutic exercise;Neuromuscular re-education;Patient/family education;Vasopneumatic Device;Manual techniques    PT Next Visit Plan  Right hip sdly abduction and "clamshell"; PAIN-FREE right quad exercises.  e'stim and vaso.   ADD [place] TO MD NOTE for approval please.    Consulted and Agree with Plan of Care  Patient       Patient will benefit from skilled therapeutic intervention in order to improve the following deficits and impairments:  Pain, Decreased strength, Increased edema  Visit Diagnosis: Acute pain of right knee  Localized edema     Problem List Patient Active Problem List   Diagnosis Date Noted  . Obesity (BMI 30-39.9) 12/06/2017  . Genetic testing 09/26/2017  . Family history of breast cancer   . Family history of colon cancer   . Family history of prostate cancer   . Family history of melanoma   . Family history of Lynch syndrome - son 08/04/2017  . Prediabetes 07/20/2015  . Estrogen deficiency 04/27/2015  . GERD (gastroesophageal reflux disease) 03/23/2015  . Hx of adenomatous polyp of colon 10/19/2014  . 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 Pain in joint, pelvic region and thigh 07/24/2014  . Need for hepatitis C screening test 07/12/2014  . Class 2 severe obesity due to excess calories with serious comorbidity and body mass index (BMI) of 36.0 to 36.9 in adult ([place]) 07/12/2014  . Pelvic pain in female 07/12/2014  . Vitamin D deficiency 07/03/2014  . Encounter for Medicare annual wellness exam 07/08/2013  . Osteoporosis 05/24/2013  . 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 Adnexal pain 11/15/2010  . Urge incontinence 11/15/2010  . Family history of coronary artery disease 10/15/2010  . Routine general medical examination at a health care facility 06/01/2010  . [place] 06/06/2009  . Hyperlipidemia 06/03/2007  . Urge urinary incontinence 06/03/2007  . MIGRAINES, HX OF  06/03/2007    Archie Atilano, [place]  MPT 01/13/2018, 11:36 AM  [facility] [address] [place], [place], [address] Phone: [phone]   Fax:  [phone]  Name: Rachel Carlson MRN: [id number]  Date of Birth: 01/06/1946

## 2018-01-16 ENCOUNTER — Ambulatory Visit: Payer: Medicare Other | Admitting: *Deleted

## 2018-01-16 DIAGNOSIS — M25561 Pain in right knee: Secondary | ICD-10-CM

## 2018-01-16 DIAGNOSIS — R600 Localized edema: Secondary | ICD-10-CM | POA: Diagnosis not present

## 2018-01-16 NOTE — Therapy (Signed)
[facility], [place], [address] Phone: [phone]   Fax:  [phone]  Physical Therapy Treatment  Patient Details  Name: Rachel Carlson MRN: [id number] Date of Birth: 12-Mar-1945 Referring Provider (PT): Gaynelle Arabian MD   Encounter Date: 01/16/2018  PT End of Session - 01/16/18 1039    Visit Number  6    Number of Visits  8    Date for PT Re-Evaluation  01/29/18    PT Start Time  1030    PT Stop Time  1125    PT Time Calculation (min)  55 min       Past Medical History:  Diagnosis Date  . Blood transfusion without reported diagnosis   . Chronic right ear pain    normal MRI  . Esophageal stricture   . Family history of breast cancer   . Family history of colon cancer   . Family history of melanoma   . Family history of prostate cancer   . Fibroids    uterine, history of  . GERD (gastroesophageal reflux disease)   . History of uterine prolapse   . Hx of adenomatous polyp of colon 10/19/2014  . Hyperlipidemia   . Obesity   . PVC (premature ventricular contraction)    first dx by holter in 1980's, echo (6/10) EF 14-43%, normal diastolic fxn, normal size RV and fxn, mild MR, PASP 6mHg  . Tubal pregnancy   . 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 BLADDER NECK SUSPENSION  1998  . BREAST BIOPSY Left   . COLONOSCOPY  05/18/2011   Dr. CSilvano Rusk . ECTOPIC PREGNANCY SURGERY  1980  . ESOPHAGOGASTRODUODENOSCOPY (EGD) WITH ESOPHAGEAL DILATION  04/03/12   Dr. CSilvano Rusk . PARTIAL HYSTERECTOMY     abdominal, fibroids, and prolapse (1998) bladder tack  . UPPER GASTROINTESTINAL ENDOSCOPY      There were no vitals filed for this visit.  Subjective Assessment - 01/16/18 1037    Subjective  MD said he wasn't worried about the knee and thought it  was going totake time    Pertinent History  OP    Patient Stated Goals  Get out of pain and back to normal as possible.    Currently in Pain?  Yes    Pain Score  5     Pain Location  Knee    Pain Orientation  Right    Pain Descriptors / Indicators  Throbbing    Pain Type  Surgical pain    Pain Onset  More than a month ago    Pain Frequency  Constant                       [facility] Adult PT Treatment/Exercise - 01/16/18 0001      Exercises   Exercises  Knee/Hip      Knee/Hip Exercises: Aerobic   Stationary Bike  Level 5 x 20 minutes.seat 8      Knee/Hip Exercises: Standing   Forward Step Up  Right;3 sets;10 reps;Step Height: 4";Hand Hold: 2    Rocker Board  3 minutes      Modalities   Modalities  [facility] visitorStimulation Location  Right knee.  IFC 1-10Hz    x 111ms  Electrical Stimulation Goals  Edema;Pain      Vasopneumatic   Number Minutes Vasopneumatic   15 minutes    Vasopnuematic Location   --   Right knee.   Vasopneumatic Pressure  Low    Vasopneumatic Temperature   36      Manual Therapy   Manual Therapy  Soft tissue mobilization    Manual therapy comments  Right knee sustained extension stretch while performing STW to posterior aspect and calf                  PT Long Term Goals - 01/01/18 1610      PT LONG TERM GOAL #1   Title  Independent with a HEP.      PT LONG TERM GOAL #2   Title  Perform ADL's with right knee pain not > 2/10.    Time  4    Period  Weeks    Status  New            Plan - 01/16/18 1040    Clinical Impression Statement  Pt arrived today doing fairly well and reports f/u with MD. He feels that her knee is doing well and wants her to continue with PT. She was able to progress with step ups today on 4 in step  in //bars using bil. UEs for assist. AROM was 5-125 degrees. Normal modality response.    Clinical Presentation  Evolving    Rehab  Potential  Good    PT Frequency  2x / week    PT Duration  8 weeks    PT Treatment/Interventions  ADLs/Self Care Home Management;Cryotherapy;Electrical Stimulation;Moist Heat;Therapeutic activities;Therapeutic exercise;Neuromuscular re-education;Patient/family education;Vasopneumatic Device;Manual techniques;Ultrasound    PT Next Visit Plan  Right hip sdly abduction and "clamshell"; PAIN-FREE right quad exercises.  e'stim and vaso.   ADD [place] TO MD NOTE for approval    Consulted and Agree with Plan of Care  Patient       Patient will benefit from skilled therapeutic intervention in order to improve the following deficits and impairments:  Pain, Decreased strength, Increased edema  Visit Diagnosis: Acute pain of right knee  Localized edema     Problem List Patient Active Problem List   Diagnosis Date Noted  . Obesity (BMI 30-39.9) 12/06/2017  . Genetic testing 09/26/2017  . Family history of breast cancer   . Family history of colon cancer   . Family history of prostate cancer   . Family history of melanoma   . Family history of Lynch syndrome - son 08/04/2017  . Prediabetes 07/20/2015  . Estrogen deficiency 04/27/2015  . GERD (gastroesophageal reflux disease) 03/23/2015  . Hx of adenomatous polyp of colon 10/19/2014  . 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 Pain in joint, pelvic region and thigh 07/24/2014  . Need for hepatitis C screening test 07/12/2014  . Class 2 severe obesity due to excess calories with serious comorbidity and body mass index (BMI) of 36.0 to 36.9 in adult ([place]) 07/12/2014  . Pelvic pain in female 07/12/2014  . Vitamin D deficiency 07/03/2014  . Encounter for Medicare annual wellness exam 07/08/2013  . Osteoporosis 05/24/2013  . 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 Adnexal pain 11/15/2010  . Urge incontinence 11/15/2010  . Family history of coronary artery disease 10/15/2010  . Routine  general medical examination at a health care facility 06/01/2010  . [place] 06/06/2009  . Hyperlipidemia 06/03/2007  . Urge  urinary incontinence 06/03/2007  . MIGRAINES, HX OF 06/03/2007    Misha Antonini,[name], PTA 01/16/2018, 12:21 PM  [facility] [place], [place], [address] Phone: [phone]   Fax:  [phone]  Name: Rachel Carlson MRN: [id number] Date of Birth: 1945/10/31

## 2018-01-20 ENCOUNTER — Ambulatory Visit: Payer: Medicare Other | Admitting: Physical Therapy

## 2018-01-20 ENCOUNTER — Encounter: Payer: Self-pay | Admitting: Physical Therapy

## 2018-01-20 DIAGNOSIS — M25561 Pain in right knee: Secondary | ICD-10-CM

## 2018-01-20 DIAGNOSIS — R600 Localized edema: Secondary | ICD-10-CM

## 2018-01-20 NOTE — Therapy (Signed)
[facility] [place], [place], [address] Phone: [phone]   Fax:  [phone]  Physical Therapy Treatment  Patient Details  Name: Rachel Carlson MRN: [id number] Date of Birth: [birth year] Referring Provider (PT): Gaynelle Arabian MD   Encounter Date: 01/20/2018  PT End of Session - 01/20/18 1136    Visit Number  7    Number of Visits  8    Date for PT Re-Evaluation  01/29/18    PT Start Time  1032    PT Stop Time  1122    PT Time Calculation (min)  50 min    Activity Tolerance  Patient tolerated treatment well    Behavior During Therapy  [facility] for tasks assessed/performed       Past Medical History:  Diagnosis Date  . Blood transfusion without reported diagnosis   . Chronic right ear pain    normal MRI  . Esophageal stricture   . Family history of breast cancer   . Family history of colon cancer   . Family history of melanoma   . Family history of prostate cancer   . Fibroids    uterine, history of  . GERD (gastroesophageal reflux disease)   . History of uterine prolapse   . Hx of adenomatous polyp of colon 10/19/2014  . Hyperlipidemia   . Obesity   . PVC (premature ventricular contraction)    first dx by holter in 1980's, echo (6/10) EF 22-63%, normal diastolic fxn, normal size RV and fxn, mild MR, PASP 104mHg  . Tubal pregnancy   . 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 BLADDER NECK SUSPENSION  1998  . BREAST BIOPSY Left   . COLONOSCOPY  05/18/2011   Dr. CSilvano Rusk . ECTOPIC PREGNANCY SURGERY  1980  . ESOPHAGOGASTRODUODENOSCOPY (EGD) WITH ESOPHAGEAL DILATION  04/03/12   Dr. CSilvano Rusk . PARTIAL HYSTERECTOMY     abdominal, fibroids, and prolapse (1998) bladder tack  . UPPER GASTROINTESTINAL ENDOSCOPY      There were no vitals filed for this  visit.                    [facility] - 01/20/18 0001      Exercises   Exercises  Knee/Hip      Knee/Hip Exercises: Aerobic   Stationary Bike  Level 6 x 15 minutes.      Modalities   Modalities  Electrical Stimulation;Moist Heat      Moist Heat Therapy   Number Minutes Moist Heat  15 Minutes      Electrical Stimulation   Electrical Stimulation Location  Right med/lat gastroc heads and med/lat right knee    Electrical Stimulation Action  Pre-mod.    Electrical Stimulation Parameters  80-150 Hz x 15 minutes.    Electrical Stimulation Goals  Edema;Pain      Manual Therapy   Manual Therapy  Soft tissue mobilization    Soft tissue mobilization  STW/M x 8 minutes to gastroc med/lat head and right patellar mobs.                  PT Long Term Goals - 01/01/18 1610      PT LONG TERM GOAL #1   Title  Independent with a HEP.      PT LONG  TERM GOAL #2   Title  Perform ADL's with right knee pain not > 2/10.    Time  4    Period  Weeks    Status  New            Plan - 01/20/18 1134    Clinical Impression Statement  Patient found to have trigger points in her right gastroc med and lateral heads.  Able to achieve full right knee extension by end of treatment.  Her patellar mobility has improved significantly.    PT Frequency  2x / week    PT Duration  8 weeks    PT Treatment/Interventions  ADLs/Self Care Home Management;Cryotherapy;Electrical Stimulation;Moist Heat;Therapeutic activities;Therapeutic exercise;Neuromuscular re-education;Patient/family education;Vasopneumatic Device;Manual techniques;Ultrasound    PT Next Visit Plan  Right hip sdly abduction and "clamshell"; PAIN-FREE right quad exercises.  e'stim and vaso.   ADD [place] TO MD NOTE for approval    Consulted and Agree with Plan of Care  Patient       Patient will benefit from skilled therapeutic intervention in order to improve the following deficits and impairments:   Pain, Decreased strength, Increased edema  Visit Diagnosis: Acute pain of right knee  Localized edema     Problem List Patient Active Problem List   Diagnosis Date Noted  . Obesity (BMI 30-39.9) 12/06/2017  . Genetic testing 09/26/2017  . Family history of breast cancer   . Family history of colon cancer   . Family history of prostate cancer   . Family history of melanoma   . Family history of Lynch syndrome - son 08/04/2017  . Prediabetes 07/20/2015  . Estrogen deficiency 04/27/2015  . GERD (gastroesophageal reflux disease) 03/23/2015  . Hx of adenomatous polyp of colon 10/19/2014  . 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 Pain in joint, pelvic region and thigh 07/24/2014  . Need for hepatitis C screening test 07/12/2014  . Class 2 severe obesity due to excess calories with serious comorbidity and body mass index (BMI) of 36.0 to 36.9 in adult ([place]) 07/12/2014  . Pelvic pain in female 07/12/2014  . Vitamin D deficiency 07/03/2014  . Encounter for Medicare annual wellness exam 07/08/2013  . Osteoporosis 05/24/2013  . 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 Adnexal pain 11/15/2010  . Urge incontinence 11/15/2010  . Family history of coronary artery disease 10/15/2010  . Routine general medical examination at a health care facility 06/01/2010  . [place] 06/06/2009  . Hyperlipidemia 06/03/2007  . Urge urinary incontinence 06/03/2007  . MIGRAINES, HX OF 06/03/2007    [name], [name] 01/20/2018, 11:37 AM  [facility] [address] [place], [place], [address] Phone: [phone]   Fax:  [phone]  Name: Rachel Carlson MRN: [id number] Date of Birth: 1945-11-01

## 2018-01-23 ENCOUNTER — Ambulatory Visit: Payer: Medicare Other | Admitting: *Deleted

## 2018-01-23 DIAGNOSIS — M25561 Pain in right knee: Secondary | ICD-10-CM | POA: Diagnosis not present

## 2018-01-23 DIAGNOSIS — R600 Localized edema: Secondary | ICD-10-CM | POA: Diagnosis not present

## 2018-01-23 NOTE — Therapy (Addendum)
Rachel Carlson [place], [place], [address] Phone: [phone]   Fax:  [phone]  Physical Therapy Treatment  Patient Details  Name: Rachel Carlson Carlson MRN: [id number] Date of Birth: Apr 10, 1945 Referring Provider (PT): Rachel Carlson Arabian MD   Encounter Date: 01/23/2018  PT End of Session - 01/23/18 1039    Visit Number  8    Number of Visits  8    Date for PT Re-Evaluation  01/29/18    PT Start Time  1030    PT Stop Time  1120    PT Time Calculation (min)  50 min       Past Medical History:  Diagnosis Date  . Blood transfusion without reported diagnosis   . Chronic right ear pain    normal MRI  . Esophageal stricture   . Family history of breast cancer   . Family history of colon cancer   . Family history of melanoma   . Family history of prostate cancer   . Fibroids    uterine, history of  . GERD (gastroesophageal reflux disease)   . History of uterine prolapse   . Hx of adenomatous polyp of colon 10/19/2014  . Hyperlipidemia   . Obesity   . PVC (premature ventricular contraction)    first dx by holter in 1980's, echo (6/10) EF 81-77%, normal diastolic fxn, normal size RV and fxn, mild MR, PASP 71mHg  . Tubal pregnancy   . 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 BLADDER NECK SUSPENSION  1998  . BREAST BIOPSY Left   . COLONOSCOPY  05/18/2011   Dr. CSilvano Rusk . ECTOPIC PREGNANCY SURGERY  1980  . ESOPHAGOGASTRODUODENOSCOPY (EGD) WITH ESOPHAGEAL DILATION  04/03/12   Dr. CSilvano Rusk . PARTIAL HYSTERECTOMY     abdominal, fibroids, and prolapse (1998) bladder tack  . UPPER GASTROINTESTINAL ENDOSCOPY      There were no vitals filed for this visit.  Subjective Assessment - 01/23/18 1032    Subjective  RT knee doing better, but gets sore in the back side. ON  hold until Jan MD visit    Pertinent History  OP    Patient Stated Goals  Get out of pain and back to normal as possible.    Currently in Pain?  Yes    Pain Score  4     Pain Location  Knee    Pain Orientation  Right    Pain Descriptors / Indicators  Throbbing    Pain Type  Surgical pain    Pain Onset  More than a month ago    Pain Frequency  Constant                       [facility] Adult PT Treatment/Exercise - 01/23/18 0001      Exercises   Exercises  Knee/Hip      Knee/Hip Exercises: Aerobic   Stationary Bike  Level 4 x 20 minutes.      Knee/Hip Exercises: Standing   Forward Step Up  Right;3 sets;10 reps;Step Height: 6"    Rocker Board  4 minutes   calf stretch     Modalities   Modalities  Electrical Stimulation;Moist Heat      Moist Heat Therapy   Number Minutes Moist Heat  15 Minutes  Moist Heat Location  Knee      Electrical Stimulation   Electrical Stimulation Location  Right knee.  IFC 1-10Hz    x 84mns    Electrical Stimulation Goals  Pain      Discussed HEP: SAQs, LAQs, Quad sets, SLR  as well as step ups.            PT Long Term Goals - 01/23/18 1040      PT LONG TERM GOAL #1   Title  Independent with a HEP.    Status  Achieved      PT LONG TERM GOAL #2   Title  Perform ADL's with right knee pain not > 2/10.    Time  4    Period  Weeks    Status  On-going            Plan - 01/23/18 1047    Clinical Impression Statement  Pt arrived today and reports that knee is staying sore and continues to get sharp pains at times, but better overall. She was mainly sore in calf and lateral aspect of patella. She did well with therex today  and is independent with HEP.ON Hold until MD F/U in January. Unable to meet LTG for ADL's due to pain greater than 2/10.    Clinical Presentation  Evolving    Clinical Decision Making  Low    Rehab Potential  Good    PT Frequency  2x / week    PT Duration  8 weeks    PT Next Visit Plan  On Hold  until Jan. MD visit    Consulted and Agree with Plan of Care  Patient       Patient will benefit from skilled therapeutic intervention in order to improve the following deficits and impairments:  Pain, Decreased strength, Increased edema  Visit Diagnosis: Acute pain of right knee  Localized edema     Problem List Patient Active Problem List   Diagnosis Date Noted  . Obesity (BMI 30-39.9) 12/06/2017  . Genetic testing 09/26/2017  . Family history of breast cancer   . Family history of colon cancer   . Family history of prostate cancer   . Family history of melanoma   . Family history of Lynch syndrome - son 08/04/2017  . Prediabetes 07/20/2015  . Estrogen deficiency 04/27/2015  . GERD (gastroesophageal reflux disease) 03/23/2015  . Hx of adenomatous polyp of colon 10/19/2014  . 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 Pain in joint, pelvic region and thigh 07/24/2014  . Need for hepatitis C screening test 07/12/2014  . Class 2 severe obesity due to excess calories with serious comorbidity and body mass index (BMI) of 36.0 to 36.9 in adult ([place] 07/12/2014  . Pelvic pain in female 07/12/2014  . Vitamin D deficiency 07/03/2014  . Encounter for Medicare annual wellness exam 07/08/2013  . Osteoporosis 05/24/2013  . 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 Adnexal pain 11/15/2010  . Urge incontinence 11/15/2010  . Family history of coronary artery disease 10/15/2010  . Routine general medical examination at a health care facility 06/01/2010  . [place]  . Hyperlipidemia 06/03/2007  . Urge urinary incontinence 06/03/2007  . MIGRAINES, HX OF 06/03/2007    ,Rachel Carlson, PTA 01/23/2018, 11:28 AM  [facility] [address]Phone: [phone]  Fax:  [phone] Name: Rachel Carlson COLSONMRN: [id number] of Birth:  Feb 11, 1945  PHYSICAL THERAPY DISCHARGE SUMMARY  Visits from Start of Care: 8.  Current functional level related to goals / functional outcomes: See above.   Remaining deficits: See above.   Education / Equipment: HEP.  Plan: Patient agrees to discharge.  Patient goals were partially met. Patient is being discharged due to                                                     ?????         [place] Applegate MPT

## 2018-03-09 ENCOUNTER — Telehealth: Payer: Self-pay | Admitting: Family Medicine

## 2018-03-09 DIAGNOSIS — R7303 Prediabetes: Secondary | ICD-10-CM

## 2018-03-09 NOTE — Telephone Encounter (Signed)
-----   Message from Ellamae Sia sent at 03/03/2018 12:43 PM EST ----- Regarding: Lab orders for Monday, 2.10.20 Lab orders, thanks

## 2018-03-10 ENCOUNTER — Other Ambulatory Visit: Payer: Medicare Other

## 2018-03-18 ENCOUNTER — Telehealth: Payer: Self-pay | Admitting: Internal Medicine

## 2018-03-18 NOTE — Telephone Encounter (Signed)
Spoke with Diann and will work on her paperwork to try and get her the brand name Lialda.

## 2018-03-19 NOTE — Telephone Encounter (Signed)
I have started working on the form and will get Dr Carlean Purl to sign and review it.

## 2018-03-20 NOTE — Telephone Encounter (Signed)
Formed has been signed and faxed to [facility] at fax # [phone]. Will await outcome.

## 2018-03-21 NOTE — Telephone Encounter (Signed)
Received fax approval from [employer] [facility] regarding patient's brand name Lialda. It is approved through 01/29/19. Left message for patient to return my call.

## 2018-03-24 NOTE — Telephone Encounter (Signed)
Patient informed of the approval.

## 2018-04-10 NOTE — Progress Notes (Signed)
Cardiology Office Note   Date:  04/11/2018   ID:  Rachel Carlson, DOB 05/05/1945, MRN [id number]  PCP:  Abner Greenspan, MD  Cardiologist:   Minus Breeding, MD    Chief Complaint  Patient presents with  . Shortness of Breath      History of Present Illness: Rachel Carlson is a 73 y.o. female who presents for a history of palpitations and PVCs.  She was previously seen by Dr. Aundra Dubin.  She has a family history of sudden cardiac death and CAD.  She had an echo in 2010 which was essentially unremarkable and a Holter that demonstrated PVCs.    She had dyspnea when I saw her last year.  She had a negative POET (Plain Old Exercise Treadmill) . Echo in 2019 and there was mild MR.     Since I last saw her she had knee surgery for medial meniscus tear.  She is recovering slowly from this.  She denies any cardiovascular symptoms such as chest pressure, neck or arm discomfort.  She has had no new palpitations, presyncope or syncope.  She has had no shortness of breath, PND or orthopnea.  She has no weight gain or edema.   Past Medical History:  Diagnosis Date  . Blood transfusion without reported diagnosis   . Chronic right ear pain    normal MRI  . Esophageal stricture   . Family history of breast cancer   . Family history of colon cancer   . Family history of melanoma   . Family history of prostate cancer   . Fibroids    uterine, history of  . GERD (gastroesophageal reflux disease)   . History of uterine prolapse   . Hx of adenomatous polyp of colon 10/19/2014  . Hyperlipidemia   . Obesity   . PVC (premature ventricular contraction)    first dx by holter in 1980's, echo (6/10) EF 59-29%, normal diastolic fxn, normal size RV and fxn, mild MR, PASP 51mHg  . Tubal pregnancy   . 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  BLADDER NECK SUSPENSION  1998  . BREAST BIOPSY Left   . COLONOSCOPY  05/18/2011   Dr. CSilvano Rusk . ECTOPIC PREGNANCY SURGERY  1980  . ESOPHAGOGASTRODUODENOSCOPY (EGD) WITH ESOPHAGEAL DILATION  04/03/12   Dr. CSilvano Rusk . PARTIAL HYSTERECTOMY     abdominal, fibroids, and prolapse (1998) bladder tack  . UPPER GASTROINTESTINAL ENDOSCOPY       Current Outpatient Medications  Medication Sig Dispense Refill  . aspirin 81 MG tablet Take 81 mg by mouth daily.      . calcium citrate-vitamin D (CITRACAL+D) 315-200 MG-UNIT per tablet Take 1 tablet by mouth 2 (two) times daily.     . Cholecalciferol (VITAMIN D-3) 5000 UNITS TABS 3,000 Units. 1 tablet by mouth on Monday,Wednesday and Friday    . Coenzyme Q10 (CO Q 10 PO) Take 400 Units by mouth daily.      . COLOCORT 100 MG/60ML enema PLACE ONE ENEMA RECTALLY EVERY DAY AT BEDTIME 1680 mL 0  . Cranberry 300 MG tablet Take 300 mg by mouth daily.      . folic acid (FOLVITE) 1 MG tablet Take 1 tablet (1 mg total) by mouth daily. 30 tablet 11  . Glucosamine-Chondroitin (GLUCOSAMINE CHONDR COMPLEX PO) Take  2 capsules by mouth daily.     . Grape Seed Extract 100 MG CAPS Take 1 capsule by mouth daily.     . hydrocortisone (ANUSOL-HC) 2.5 % rectal cream Place 1 application rectally 2 (two) times daily. 30 g 1  . hydrocortisone (CORTENEMA) 100 MG/60ML enema Place 1 enema (100 mg total) rectally at bedtime as needed. 30 enema 0  . lansoprazole (PREVACID) 15 MG capsule Take 1 capsule (15 mg total) by mouth 2 (two) times daily before a meal.    . LIALDA 1.2 g EC tablet TAKE TWO TABLETS BY MOUTH TWICE DAILY 120 tablet 5  . Omega-3 Fatty Acids (FISH OIL TRIPLE STRENGTH) 1400 MG CAPS Take by mouth daily.      . raloxifene (EVISTA) 60 MG tablet Take 1 tablet (60 mg total) by mouth daily. 90 tablet 3  . rosuvastatin (CRESTOR) 20 MG tablet TAKE 1 TABLET BY MOUTH AT BEDTIME 90 tablet 3  . VITAMIN B COMPLEX-C CAPS Take 1 capsule by mouth daily.      No current  facility-administered medications for this visit.     Allergies:   Pantoprazole sodium; Ranitidine; Omeprazole; and Sulfasalazine  ROS:  Please see the history of present illness.   Otherwise, review of systems are positive for none.   All other systems are reviewed and negative.    PHYSICAL EXAM: VS:  BP (!) 142/98   Pulse 74   Ht 5' 2.5" (1.588 m)   Wt 196 lb 6.4 oz (89.1 kg)   LMP 05/29/1996   BMI 35.35 kg/m  , BMI Body mass index is 35.35 kg/m.  GENERAL:  Well appearing NECK:  No jugular venous distention, waveform within normal limits, carotid upstroke brisk and symmetric, no bruits, no thyromegaly LUNGS:  Clear to auscultation bilaterally CHEST:  Unremarkable HEART:  PMI not displaced or sustained,S1 and S2 within normal limits, no S3, no S4, no clicks, no rubs, very soft apical systolic murmur radiating slightly to the axilla, no diastolic murmurs ABD:  Flat, positive bowel sounds normal in frequency in pitch, no bruits, no rebound, no guarding, no midline pulsatile mass, no hepatomegaly, no splenomegaly EXT:  2 plus pulses throughout, no edema, no cyanosis no clubbing   EKG:  EKG  ordered today. The ekg ordered 10/26/15 demonstrates normal sinus rhythm, rate 74, axis within normal limits, intervals within normal limits, no acute ST-T wave changes.  Premature ectopic complexes   Recent Labs: 07/09/2017: ALT 13; BUN 12; Creatinine, Ser 0.73; Hemoglobin 13.7; Platelets 218.0; Potassium 4.5; Sodium 142; TSH 2.64    Lipid Panel    Component Value Date/Time   CHOL 162 07/09/2017 0834   TRIG 122.0 07/09/2017 0834   HDL 49.40 07/09/2017 0834   CHOLHDL 3 07/09/2017 0834   VLDL 24.4 07/09/2017 0834   LDLCALC 88 07/09/2017 0834   LDLDIRECT 257.3 06/04/2008 1005      Wt Readings from Last 3 Encounters:  04/11/18 196 lb 6.4 oz (89.1 kg)  12/06/17 196 lb 8 oz (89.1 kg)  09/02/17 199 lb (90.3 kg)      Other studies Reviewed: Additional studies/ records that were reviewed  today include: None Review of the above records demonstrates:     ASSESSMENT AND PLAN:  DYSPNEA:    This is baseline and may be a little bit more exacerbated by her injury and inactivity.  However, no further testing is indicated at this time.  She will continue her rehab and hopefully slowly recover from this and if not  she will let me know if her shortness of breath worsens.   OVERWIEGHT:     Her weight has actually come down a little bit despite her knee injury.  I applauded this and encouraged more of the same.  DYSLIPIDEMIA:    Her LDL was slightly elevated but her HDL was excellent.  I will continue the meds as listed.  We talked about goals of an LDL less than 70.    HTN:   Her blood pressure is slightly elevated.  This is unusual.  She is going to keep a blood pressure diary.  Further evaluation will be based on these results.  MR:  She had some mild MR on echo in 2000.  I do not think this is clinically more significant based on her exam.  No change in therapy or further imaging is indicated.  Current medicines are reviewed at length with the patient today.  The patient does not have concerns regarding medicines.  The following changes have been made:  None  Labs/ tests ordered today include:  None  Orders Placed This Encounter  Procedures  . EKG 12-Lead     Disposition:   FU with me in one year.     Signed, Minus Breeding, MD  04/11/2018 2:16 PM    [facility]

## 2018-04-11 ENCOUNTER — Ambulatory Visit: Payer: Medicare Other | Admitting: Cardiology

## 2018-04-11 ENCOUNTER — Encounter: Payer: Self-pay | Admitting: Cardiology

## 2018-04-11 ENCOUNTER — Other Ambulatory Visit: Payer: Self-pay

## 2018-04-11 VITALS — BP 142/98 | HR 74 | Ht 62.5 in | Wt 196.4 lb

## 2018-04-11 DIAGNOSIS — E785 Hyperlipidemia, unspecified: Secondary | ICD-10-CM

## 2018-04-11 DIAGNOSIS — I493 Ventricular premature depolarization: Secondary | ICD-10-CM

## 2018-04-11 DIAGNOSIS — I10 Essential (primary) hypertension: Secondary | ICD-10-CM | POA: Insufficient documentation

## 2018-04-11 DIAGNOSIS — R0602 Shortness of breath: Secondary | ICD-10-CM | POA: Diagnosis not present

## 2018-04-11 NOTE — Patient Instructions (Addendum)
Medication Instructions:  Continue current medications  If you need a refill on your cardiac medications before your next appointment, please call your pharmacy.  Labwork: None ordered   Testing/Procedures: None Ordered   Follow-Up: .    Your physician recommends that you schedule a follow-up appointment in: [place], you and your health needs are our priority.  As part of our continuing mission to provide you with exceptional heart care, we have created designated Provider Care Teams.  These Care Teams include your primary Cardiologist (physician) and Advanced Practice Providers (APPs -  Physician Assistants and Nurse Practitioners) who all work together to provide you with the care you need, when you need it.  Thank you for choosing [facility] at [facility]!!

## 2018-04-18 ENCOUNTER — Other Ambulatory Visit: Payer: Self-pay | Admitting: Family Medicine

## 2018-04-18 DIAGNOSIS — Z1231 Encounter for screening mammogram for malignant neoplasm of breast: Secondary | ICD-10-CM

## 2018-05-04 ENCOUNTER — Other Ambulatory Visit: Payer: Self-pay | Admitting: Internal Medicine

## 2018-06-02 ENCOUNTER — Ambulatory Visit: Payer: Medicare Other

## 2018-07-07 ENCOUNTER — Other Ambulatory Visit: Payer: Self-pay | Admitting: Internal Medicine

## 2018-07-17 ENCOUNTER — Other Ambulatory Visit: Payer: Self-pay

## 2018-07-17 ENCOUNTER — Ambulatory Visit
Admission: RE | Admit: 2018-07-17 | Discharge: 2018-07-17 | Disposition: A | Discharge status: Home / Self Care | Payer: Medicare Other | Source: Ambulatory Visit | Attending: Family Medicine | Admitting: Family Medicine

## 2018-07-17 DIAGNOSIS — Z1231 Encounter for screening mammogram for malignant neoplasm of breast: Secondary | ICD-10-CM

## 2018-07-18 ENCOUNTER — Ambulatory Visit: Payer: Medicare Other

## 2018-07-21 ENCOUNTER — Encounter: Payer: Medicare Other | Admitting: Family Medicine

## 2018-07-29 DIAGNOSIS — H524 Presbyopia: Secondary | ICD-10-CM | POA: Diagnosis not present

## 2018-07-29 DIAGNOSIS — H43393 Other vitreous opacities, bilateral: Secondary | ICD-10-CM | POA: Diagnosis not present

## 2018-08-04 ENCOUNTER — Other Ambulatory Visit: Payer: Medicare Other

## 2018-08-06 ENCOUNTER — Other Ambulatory Visit (INDEPENDENT_AMBULATORY_CARE_PROVIDER_SITE_OTHER): Payer: Medicare Other

## 2018-08-06 DIAGNOSIS — R7303 Prediabetes: Secondary | ICD-10-CM

## 2018-08-06 LAB — BASIC METABOLIC PANEL
BUN: 11 mg/dL (ref 6–23)
CO2: 24 mEq/L (ref 19–32)
Calcium: 8.9 mg/dL (ref 8.4–10.5)
Chloride: 108 mEq/L (ref 96–112)
Creatinine, Ser: 0.64 mg/dL (ref 0.40–1.20)
GFR: 91.57 mL/min (ref >60.00)
Glucose, Bld: 95 mg/dL (ref 70–99)
Potassium: 4.3 mEq/L (ref 3.5–5.1)
Sodium: 141 mEq/L (ref 135–145)

## 2018-08-06 LAB — HEMOGLOBIN A1C: Hgb A1c MFr Bld: 6.1 % (ref 4.6–6.5)

## 2018-08-07 ENCOUNTER — Ambulatory Visit (INDEPENDENT_AMBULATORY_CARE_PROVIDER_SITE_OTHER): Payer: Medicare Other | Admitting: Family Medicine

## 2018-08-07 ENCOUNTER — Encounter: Payer: Self-pay | Admitting: Family Medicine

## 2018-08-07 ENCOUNTER — Other Ambulatory Visit: Payer: Self-pay

## 2018-08-07 VITALS — BP 129/80 | HR 49 | Temp 97.7°F | Ht 62.25 in | Wt 198.5 lb

## 2018-08-07 DIAGNOSIS — K5130 Ulcerative (chronic) rectosigmoiditis without complications: Secondary | ICD-10-CM | POA: Diagnosis not present

## 2018-08-07 DIAGNOSIS — Z0000 Encounter for general adult medical examination without abnormal findings: Secondary | ICD-10-CM

## 2018-08-07 DIAGNOSIS — E559 Vitamin D deficiency, unspecified: Secondary | ICD-10-CM | POA: Diagnosis not present

## 2018-08-07 DIAGNOSIS — L989 Disorder of the skin and subcutaneous tissue, unspecified: Secondary | ICD-10-CM | POA: Insufficient documentation

## 2018-08-07 DIAGNOSIS — I10 Essential (primary) hypertension: Secondary | ICD-10-CM | POA: Diagnosis not present

## 2018-08-07 DIAGNOSIS — R21 Rash and other nonspecific skin eruption: Secondary | ICD-10-CM | POA: Insufficient documentation

## 2018-08-07 DIAGNOSIS — E7800 Pure hypercholesterolemia, unspecified: Secondary | ICD-10-CM | POA: Diagnosis not present

## 2018-08-07 DIAGNOSIS — M810 Age-related osteoporosis without current pathological fracture: Secondary | ICD-10-CM | POA: Diagnosis not present

## 2018-08-07 DIAGNOSIS — E669 Obesity, unspecified: Secondary | ICD-10-CM

## 2018-08-07 DIAGNOSIS — R7303 Prediabetes: Secondary | ICD-10-CM

## 2018-08-07 DIAGNOSIS — Z6836 Body mass index (BMI) 36.0-36.9, adult: Secondary | ICD-10-CM

## 2018-08-07 LAB — CBC WITH DIFFERENTIAL/PLATELET
Basophils Absolute: 0 10*3/uL (ref 0.0–0.1)
Basophils Relative: 1 % (ref 0.0–3.0)
Eosinophils Absolute: 0.1 10*3/uL (ref 0.0–0.7)
Eosinophils Relative: 1.8 % (ref 0.0–5.0)
HCT: 39.3 % (ref 36.0–46.0)
Hemoglobin: 13.1 g/dL (ref 12.0–15.0)
Lymphocytes Relative: 32.3 % (ref 12.0–46.0)
Lymphs Abs: 1.5 10*3/uL (ref 0.7–4.0)
MCHC: 33.2 g/dL (ref 30.0–36.0)
MCV: 90.4 fl (ref 78.0–100.0)
Monocytes Absolute: 0.5 10*3/uL (ref 0.1–1.0)
Monocytes Relative: 10.2 % (ref 3.0–12.0)
Neutro Abs: 2.6 10*3/uL (ref 1.4–7.7)
Neutrophils Relative %: 54.7 % (ref 43.0–77.0)
Platelets: 206 10*3/uL (ref 150.0–400.0)
RBC: 4.35 Mil/uL (ref 3.87–5.11)
RDW: 13.5 % (ref 11.5–15.5)
WBC: 4.7 10*3/uL (ref 4.0–10.5)

## 2018-08-07 LAB — VITAMIN D 25 HYDROXY (VIT D DEFICIENCY, FRACTURES): VITD: 23.85 ng/mL — ABNORMAL LOW (ref 30.00–100.00)

## 2018-08-07 LAB — LIPID PANEL
Cholesterol: 145 mg/dL (ref 0–200)
HDL: 61 mg/dL (ref >39.00)
LDL Cholesterol: 61 mg/dL (ref 0–99)
NonHDL: 83.6
Total CHOL/HDL Ratio: 2
Triglycerides: 111 mg/dL (ref 0.0–149.0)
VLDL: 22.2 mg/dL (ref 0.0–40.0)

## 2018-08-07 LAB — TSH: TSH: 3.24 u[IU]/mL (ref 0.35–4.50)

## 2018-08-07 MED ORDER — ROSUVASTATIN CALCIUM 20 MG PO TABS: 20.0000 mg | ORAL_TABLET | Freq: Every day | ORAL | 3 refills | Status: DC

## 2018-08-07 NOTE — Assessment & Plan Note (Signed)
Done with 5 y of evista- will take a drug holiday  No falls or fx  Check D level today Enc exercise  dexa due 05/2019

## 2018-08-07 NOTE — Assessment & Plan Note (Signed)
Reviewed health habits including diet and exercise and skin cancer prevention Reviewed appropriate screening tests for age  Also reviewed health mt list, fam hx and immunization status , as well as social and family history   Recent labs rev and remaining ones done today  utd with cancer screening and imms  Nl breast exam  Done with evista for 5 y course for OP Enc exercise and wt loss Ref done to derm for skin lesion  utd adv directive No cognitive concerns No falls or fx (disc fall prev)  Reassuring hearing screen and utd eye exam

## 2018-08-07 NOTE — Assessment & Plan Note (Signed)
Disc goals for lipids and reasons to control them Rev last labs with pt Rev low sat fat diet in detail  Continues crestor 20 mg  Lab today

## 2018-08-07 NOTE — Assessment & Plan Note (Signed)
Discussed how this problem influences overall health and the risks it imposes  Reviewed plan for weight loss with lower calorie diet (via better food choices and also portion control or program like weight watchers) and exercise building up to or more than 30 minutes 5 days per week including some aerobic activity    

## 2018-08-07 NOTE — Assessment & Plan Note (Signed)
Itchy/scaly spot below R ear that does not heal Ref to dermatology for eval Enc sunscreen use

## 2018-08-07 NOTE — Assessment & Plan Note (Signed)
No recent symptoms  Due for colonoscopy 09/2019

## 2018-08-07 NOTE — Assessment & Plan Note (Signed)
bp in fair control at this time  BP Readings from Last 1 Encounters:  08/07/18 129/80   No changes needed Most recent labs reviewed  Disc lifstyle change with low sodium diet and exercise

## 2018-08-07 NOTE — Progress Notes (Signed)
Subjective:    Patient ID: Rachel Carlson, female    DOB: Jan 18, 1946, 73 y.o.   MRN: [id number]  HPI  Here for health maintenance exam and to review chronic medical problems as well as amw   Weight  Wt Readings from Last 3 Encounters:  08/07/18 198 lb 8 oz (90 kg)  04/11/18 196 lb 6.4 oz (89.1 kg)  12/06/17 196 lb 8 oz (89.1 kg)  wt is stable  She is moving - housework/ yardwork - no structured exercise  At least 30 min per day of elevated HR from work at home  36.02 kg/m  Flu vaccine 11/19 Tdap 10/17 Finished shingrix series 3/19 utd pna vaccines   dexa 5/19  Osteoporosis /worse in the spine  She continues evista and ca/D Has been on evista for 5 y now D level 29.6 a year ago   Mammogram 6/20  Self breast exam - no lumps on self exam   Colonoscopy 9/16 with 5 y recall  Hx of ulcerative proctosigmoiditis   Hep C screen neg 2016  I have personally reviewed the Medicare Annual Wellness questionnaire and have noted 1. The patient's medical and social history 2. Their use of alcohol, tobacco or illicit drugs 3. Their current medications and supplements 4. The patient's functional ability including ADL's, fall risks, home safety risks and hearing or visual             impairment. 5. Diet and physical activities 6. Evidence for depression or mood disorders  The patients weight, height, BMI have been recorded in the chart and visual acuity is per eye clinic.  I have made referrals, counseling and provided education to the patient based review of the above and I have provided the pt with a written personalized care plan for preventive services. Reviewed and updated provider list, see scanned forms.  See scanned forms.  Routine anticipatory guidance given to patient.  See health maintenance.   Hearing Screening   125Hz  250Hz  500Hz  1000Hz  2000Hz  3000Hz  4000Hz  6000Hz  8000Hz   Right ear:   40 40 40  40    Left ear:   40 40 40  40    Vision Screening Comments: Pt had  eye exam in July 2020 with Dr. Radford Pax at [place] directive: has a living will and POA (her POA is her husband)  Cognitive function addressed- see scanned forms- and if abnormal then additional documentation follows.  occ misplaces things -otherwise no problems  Not getting lost or confused   PMH and SH reviewed  Meds, vitals, and allergies reviewed.   ROS: See HPI.  Otherwise negative.    bp is stable today  No cp or palpitations or headaches or edema  No side effects to medicines  BP Readings from Last 3 Encounters:  08/07/18 140/82  04/11/18 (!) 142/98  12/06/17 126/82    Needs to limit portions more than she is    Lab Results  Component Value Date   CREATININE 0.64 08/06/2018   BUN 11 08/06/2018   NA 141 08/06/2018   K 4.3 08/06/2018   CL 108 08/06/2018   CO2 24 08/06/2018     Hyperlipidemia Lab Results  Component Value Date   CHOL 162 07/09/2017   HDL 49.40 07/09/2017   LDLCALC 88 07/09/2017   LDLDIRECT 257.3 06/04/2008   TRIG 122.0 07/09/2017   CHOLHDL 3 07/09/2017   Prediabetes Lab Results  Component Value Date   HGBA1C 6.1 08/06/2018  down  from 6.2     Patient Active Problem List   Diagnosis Date Noted  . Skin lesion 08/07/2018  . Essential hypertension 04/11/2018  . SOB (shortness of breath) 04/11/2018  . PVC's (premature ventricular contractions) 04/11/2018  . Obesity (BMI 30-39.9) 12/06/2017  . Genetic testing 09/26/2017  . Family history of breast cancer   . Family history of colon cancer   . Family history of prostate cancer   . Family history of melanoma   . Family history of Lynch syndrome - son 08/04/2017  . Prediabetes 07/20/2015  . Estrogen deficiency 04/27/2015  . GERD (gastroesophageal reflux disease) 03/23/2015  . Hx of adenomatous polyp of colon 10/19/2014  . 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 Pain in joint, pelvic region and thigh 07/24/2014  . Need for hepatitis C screening test 07/12/2014   . Class 2 severe obesity due to excess calories with serious comorbidity and body mass index (BMI) of 36.0 to 36.9 in adult ([place]) 07/12/2014  . Pelvic pain in female 07/12/2014  . Vitamin D deficiency 07/03/2014  . Encounter for Medicare annual wellness exam 07/08/2013  . Osteoporosis 05/24/2013  . 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 Adnexal pain 11/15/2010  . Urge incontinence 11/15/2010  . Family history of coronary artery disease 10/15/2010  . Routine general medical examination at a health care facility 06/01/2010  . [place], [place] 06/06/2009  . Hyperlipidemia 06/03/2007  . Urge urinary incontinence 06/03/2007  . MIGRAINES, HX OF 06/03/2007   Past Medical History:  Diagnosis Date  . Blood transfusion without reported diagnosis   . Chronic right ear pain    normal MRI  . Esophageal stricture   . Family history of breast cancer   . Family history of colon cancer   . Family history of melanoma   . Family history of prostate cancer   . Fibroids    uterine, history of  . GERD (gastroesophageal reflux disease)   . History of uterine prolapse   . Hx of adenomatous polyp of colon 10/19/2014  . Hyperlipidemia   . Obesity   . PVC (premature ventricular contraction)    first dx by holter in 1980's, echo (6/10) EF 31-54%, normal diastolic fxn, normal size RV and fxn, mild MR, PASP 40mHg  . Tubal pregnancy   . [place]   . Urge urinary incontinence 06/03/2007   Qualifier: Diagnosis of  By: BMarcelino ScotCMA, AAuburn Bilberry    Past Surgical History:  Procedure Laterality Date  . BLADDER NECK SUSPENSION  1998  . BREAST BIOPSY Left   . COLONOSCOPY  05/18/2011   Dr. CSilvano Rusk . ECTOPIC PREGNANCY SURGERY  1980  . ESOPHAGOGASTRODUODENOSCOPY (EGD) WITH ESOPHAGEAL DILATION  04/03/12   Dr. CSilvano Rusk . PARTIAL HYSTERECTOMY     abdominal, fibroids, and prolapse (1998) bladder tack  . UPPER GASTROINTESTINAL  ENDOSCOPY     Social History   Tobacco Use  . Smoking status: Never Smoker  . Smokeless tobacco: Never Used  Substance Use Topics  . Alcohol use: No    Alcohol/week: 0.0 standard drinks    Comment: rarely beer  . Drug use: No   Family History  Problem Relation Age of Onset  . Sudden death Father 473      "coronary arteriosclerosis" on death certificate  . Heart attack Mother 744 . Transient ischemic attack Mother   . Diabetes type II Mother   . Sudden death Mother  died age 69  . Diabetes Mother   . Coronary artery disease Mother   . Skin cancer Mother   . Arrhythmia Brother   . Colon cancer Brother 35  . Breast cancer Sister        dx in her 74s  . Colon polyps Sister        adenomatous  . Colon cancer Son 96  . Melanoma Maternal Uncle   . Congestive Heart Failure Maternal Grandmother   . Prostate cancer Maternal Grandfather        dx in 5s  . Tuberculosis Paternal Grandfather   . Skin cancer Maternal Aunt   . Other Son        parotid gland tumor  . Ehlers-Danlos syndrome Daughter   . Colon cancer Cousin        mid 88s; maternal cousin  . Colon cancer Other        MGMs brother   Allergies  Allergen Reactions  . Pantoprazole Sodium     Abdominal pain, diarrhea  . Ranitidine     Diarrhea, abdominal pain  . Omeprazole Diarrhea    Abdominal pain also   . Sulfasalazine Rash    Fever, chills, headache, muscle pain   Current Outpatient Medications on File Prior to Visit  Medication Sig Dispense Refill  . aspirin 81 MG tablet Take 81 mg by mouth daily.      . calcium citrate-vitamin D (CITRACAL+D) 315-200 MG-UNIT per tablet Take 1 tablet by mouth 2 (two) times daily.     . Cholecalciferol (VITAMIN D-3) 5000 UNITS TABS 3,000 Units. 1 tablet by mouth on Monday,Wednesday and Friday    . Coenzyme Q10 (CO Q 10 PO) Take 400 Units by mouth daily.      . COLOCORT 100 MG/60ML enema PLACE ONE ENEMA RECTALLY EVERY DAY AT BEDTIME 1680 mL 0  . Cranberry 300 MG  tablet Take 300 mg by mouth daily.      . folic acid (FOLVITE) 1 MG tablet Take 1 tablet (1 mg total) by mouth daily. 30 tablet 11  . Glucosamine-Chondroitin (GLUCOSAMINE CHONDR COMPLEX PO) Take 2 capsules by mouth daily.     . Grape Seed Extract 100 MG CAPS Take 1 capsule by mouth daily.     . lansoprazole (PREVACID) 15 MG capsule Take 1 capsule (15 mg total) by mouth 2 (two) times daily before a meal.    . LIALDA 1.2 g EC tablet Take 2 tablets by mouth twice daily 120 tablet 0  . Omega-3 Fatty Acids (FISH OIL TRIPLE STRENGTH) 1400 MG CAPS Take by mouth daily.      Marland Kitchen VITAMIN B COMPLEX-C CAPS Take 1 capsule by mouth daily.      No current facility-administered medications on file prior to visit.     Review of Systems  Constitutional: Positive for fatigue. Negative for activity change, appetite change, fever and unexpected weight change.  HENT: Negative for congestion, ear pain, rhinorrhea, sinus pressure and sore throat.   Eyes: Negative for pain, redness and visual disturbance.  Respiratory: Negative for cough, shortness of breath and wheezing.   Cardiovascular: Negative for chest pain and palpitations.  Gastrointestinal: Negative for abdominal pain, blood in stool, constipation and diarrhea.  Endocrine: Negative for polydipsia and polyuria.  Genitourinary: Negative for dysuria, frequency and urgency.  Musculoskeletal: Positive for arthralgias. Negative for back pain and myalgias.  Skin: Negative for pallor and rash.       Skin lesion near R ear  Allergic/Immunologic: Negative for  environmental allergies.  Neurological: Negative for dizziness, syncope and headaches.  Hematological: Negative for adenopathy. Does not bruise/bleed easily.  Psychiatric/Behavioral: Negative for decreased concentration and dysphoric mood. The patient is not nervous/anxious.        Objective:   Physical Exam Constitutional:      General: She is not in acute distress.    Appearance: Normal appearance. She  is well-developed. She is obese. She is not ill-appearing or diaphoretic.  HENT:     Head: Normocephalic and atraumatic.     Right Ear: Tympanic membrane, ear canal and external ear normal.     Left Ear: Tympanic membrane, ear canal and external ear normal.     Nose: Nose normal.     Mouth/Throat:     Mouth: Mucous membranes are moist.     Pharynx: Oropharynx is clear. No posterior oropharyngeal erythema.  Eyes:     General: No scleral icterus.    Conjunctiva/sclera: Conjunctivae normal.     Pupils: Pupils are equal, round, and reactive to light.  Neck:     Musculoskeletal: Normal range of motion and neck supple.     Thyroid: No thyromegaly.     Vascular: No carotid bruit or JVD.  Cardiovascular:     Rate and Rhythm: Normal rate and regular rhythm.     Heart sounds: Normal heart sounds. No murmur. No gallop.   Pulmonary:     Effort: Pulmonary effort is normal. No respiratory distress.     Breath sounds: Normal breath sounds. No wheezing.  Chest:     Chest wall: No tenderness.  Abdominal:     General: Bowel sounds are normal. There is no distension or abdominal bruit.     Palpations: Abdomen is soft. There is no mass.     Tenderness: There is no abdominal tenderness.  Genitourinary:    Comments: Breast exam: No mass, nodules, thickening, tenderness, bulging, retraction, inflamation, nipple discharge or skin changes noted.  No axillary or clavicular LA.     Musculoskeletal: Normal range of motion.        General: No tenderness.  Lymphadenopathy:     Cervical: No cervical adenopathy.  Skin:    General: Skin is warm and dry.     Coloration: Skin is not pale.     Findings: No erythema or rash.     Comments: Solar lentigines diffusely Fair complexion 2-3 mm area of scale/pink under R ear  Neurological:     Mental Status: She is alert. Mental status is at baseline.     Cranial Nerves: No cranial nerve deficit.     Motor: No abnormal muscle tone.     Coordination: Coordination  normal.     Gait: Gait normal.     Deep Tendon Reflexes: Reflexes are normal and symmetric.  Psychiatric:        Attention and Perception: She is attentive.        Mood and Affect: Mood normal. Mood is not anxious or depressed.        Cognition and Memory: Cognition and memory normal.           Assessment & Plan:   Problem List Items Addressed This Visit      Cardiovascular and Mediastinum   Essential hypertension    bp in fair control at this time  BP Readings from Last 1 Encounters:  08/07/18 129/80   No changes needed Most recent labs reviewed  Disc lifstyle change with low sodium diet and exercise  Relevant Medications   rosuvastatin (CRESTOR) 20 MG tablet   Other Relevant Orders   CBC with Differential/Platelet (Completed)   Lipid panel (Completed)   TSH (Completed)     Digestive   Ulcerative proctosigmoiditis ([facility])    No recent symptoms  Due for colonoscopy 09/2019        Musculoskeletal and Integument   Osteoporosis    Done with 5 y of evista- will take a drug holiday  No falls or fx  Check D level today Enc exercise  dexa due 05/2019      Skin lesion    Itchy/scaly spot below R ear that does not heal Ref to dermatology for eval Enc sunscreen use      Relevant Orders   Ambulatory referral to Dermatology     Other   Hyperlipidemia    Disc goals for lipids and reasons to control them Rev last labs with pt Rev low sat fat diet in detail  Continues crestor 20 mg  Lab today      Relevant Medications   rosuvastatin (CRESTOR) 20 MG tablet   Other Relevant Orders   Lipid panel (Completed)   Routine general medical examination at a health care facility    Reviewed health habits including diet and exercise and skin cancer prevention Reviewed appropriate screening tests for age  Also reviewed health mt list, fam hx and immunization status , as well as social and family history   Recent labs rev and remaining ones done today  utd with  cancer screening and imms  Nl breast exam  Done with evista for 5 y course for OP Enc exercise and wt loss Ref done to derm for skin lesion  utd adv directive No cognitive concerns No falls or fx (disc fall prev)  Reassuring hearing screen and utd eye exam         Encounter for Medicare annual wellness exam - Primary    Reviewed health habits including diet and exercise and skin cancer prevention Reviewed appropriate screening tests for age  Also reviewed health mt list, fam hx and immunization status , as well as social and family history   Recent labs rev and remaining ones done today  utd with cancer screening and imms  Nl breast exam  Done with evista for 5 y course for OP Enc exercise and wt loss Ref done to derm for skin lesion  utd adv directive No cognitive concerns No falls or fx (disc fall prev)  Reassuring hearing screen and utd eye exam       Vitamin D deficiency    In setting of OP D level today      Relevant Orders   VITAMIN D 25 Hydroxy (Vit-D Deficiency, Fractures) (Completed)   Class 2 severe obesity due to excess calories with serious comorbidity and body mass index (BMI) of 36.0 to 36.9 in adult [facility])    Discussed how this problem influences overall health and the risks it imposes  Reviewed plan for weight loss with lower calorie diet (via better food choices and also portion control or program like weight watchers) and exercise building up to or more than 30 minutes 5 days per week including some aerobic activity         Prediabetes    Controlled with diet Enc wt loss Lab Results  Component Value Date   HGBA1C 6.1 08/06/2018   disc imp of low glycemic diet and wt loss to prevent DM2       Obesity (  BMI 30-39.9)    Discussed how this problem influences overall health and the risks it imposes  Reviewed plan for weight loss with lower calorie diet (via better food choices and also portion control or program like weight watchers) and exercise  building up to or more than 30 minutes 5 days per week including some aerobic activity          Other Visit Diagnoses    Dyslipidemia       Relevant Medications   rosuvastatin (CRESTOR) 20 MG tablet

## 2018-08-07 NOTE — Assessment & Plan Note (Signed)
In setting of OP D level today

## 2018-08-07 NOTE — Assessment & Plan Note (Signed)
Controlled with diet Enc wt loss Lab Results  Component Value Date   HGBA1C 6.1 08/06/2018   disc imp of low glycemic diet and wt loss to prevent DM2

## 2018-08-07 NOTE — Patient Instructions (Addendum)
Get your flu shot in the fall   Stop evista- you are done with it for now   Try to get most of your carbohydrates from produce (with the exception of white potatoes)  Eat less bread/pasta/rice/snack foods/cereals/sweets and other items from the middle of the grocery store (processed carbs)   Labs today   The office will call you about the dermatology referral   Blood pressure was better on 2nd check

## 2018-08-08 ENCOUNTER — Telehealth: Payer: Self-pay | Admitting: *Deleted

## 2018-08-08 ENCOUNTER — Encounter: Payer: Self-pay | Admitting: *Deleted

## 2018-08-08 NOTE — Telephone Encounter (Signed)
Patient returned your call.  C/B # [phone]

## 2018-08-08 NOTE — Telephone Encounter (Signed)
Called pt back and addressed through result notes

## 2018-08-08 NOTE — Telephone Encounter (Signed)
Left VM requesting pt to call the office back regarding lab results  

## 2018-08-15 NOTE — Telephone Encounter (Signed)
Pt returned your call Best number [phone]

## 2018-08-15 NOTE — Telephone Encounter (Signed)
Addressed in results notes

## 2018-09-12 ENCOUNTER — Encounter: Payer: Self-pay | Admitting: Family Medicine

## 2018-09-12 DIAGNOSIS — H35371 Puckering of macula, right eye: Secondary | ICD-10-CM | POA: Diagnosis not present

## 2018-09-12 DIAGNOSIS — H25041 Posterior subcapsular polar age-related cataract, right eye: Secondary | ICD-10-CM | POA: Diagnosis not present

## 2018-09-12 DIAGNOSIS — H35362 Drusen (degenerative) of macula, left eye: Secondary | ICD-10-CM | POA: Diagnosis not present

## 2018-09-12 DIAGNOSIS — H2513 Age-related nuclear cataract, bilateral: Secondary | ICD-10-CM | POA: Diagnosis not present

## 2018-09-12 DIAGNOSIS — H25013 Cortical age-related cataract, bilateral: Secondary | ICD-10-CM | POA: Diagnosis not present

## 2018-10-08 ENCOUNTER — Other Ambulatory Visit: Payer: Self-pay | Admitting: Internal Medicine

## 2018-12-31 ENCOUNTER — Ambulatory Visit: Payer: Medicare Other | Admitting: Family Medicine

## 2018-12-31 ENCOUNTER — Telehealth: Payer: Self-pay | Admitting: Family Medicine

## 2018-12-31 ENCOUNTER — Other Ambulatory Visit: Payer: Self-pay

## 2018-12-31 DIAGNOSIS — Z20822 Contact with and (suspected) exposure to covid-19: Secondary | ICD-10-CM

## 2018-12-31 NOTE — Telephone Encounter (Signed)
Pt notified of Dr. Marliss Coots instructions and recommendations. Pt will go to [facility] to get Covid test done

## 2018-12-31 NOTE — Telephone Encounter (Signed)
Pt called wanting to know if we do covid testing.  She is having headache/hot/cold/ abd pain.  This has been gong on for 2 days now.  Her son in law tested positive  Monday they were around them thanksgiving.  She is wanted to get a rapid test  She lives in [place].  Every place she has called is 3-5 day result time  Offered virtual appointment pt could not do this declined phone visit.  Please advise what she needs to do  Pt is aware she needs to quarantine till she hears from office.  She stated her husband has ALS and if he needs anything she will have to go out and get it.  She stated she called the nurse line she didn't leave message.

## 2018-12-31 NOTE — Telephone Encounter (Signed)
Please give her the address of our closest site for testing (I think [place] is in [facility])     (I have been getting results in avg of 2 days)  Some [facility] locations are testing (can call) , also the health dept  Many urgent cares also- but I cannot give her estimate of how long that would take re: result or to get appt   Isolate the best she can and keep [place] posted   Unless symptoms are severe I would stay out of the ER

## 2019-01-02 LAB — NOVEL CORONAVIRUS, NAA: SARS-CoV-2, NAA: DETECTED — AB

## 2019-01-03 ENCOUNTER — Telehealth: Payer: Self-pay | Admitting: Unknown Physician Specialty

## 2019-01-03 ENCOUNTER — Other Ambulatory Visit: Payer: Self-pay | Admitting: Unknown Physician Specialty

## 2019-01-03 DIAGNOSIS — U071 COVID-19: Secondary | ICD-10-CM

## 2019-01-03 NOTE — Telephone Encounter (Signed)
Called to discuss with patient about Covid symptoms and the use of bamlanivimab, a monoclonal antibody infusion for those with mild to moderate Covid symptoms and at a high risk of hospitalization.  Pt is qualified for this infusion at the [facility] infusion center due to age  Left message to call back

## 2019-01-03 NOTE — Telephone Encounter (Signed)
Discussed with patient about Covid symptoms and the use of bamlanivimab, a monoclonal antibody infusion for those with mild to moderate Covid symptoms and at a high risk of hospitalization.  She has had 4 days of flu like symptoms of fever, cough, aches, headache, no taste or smell, and chills.  Pt is qualified for this infusion at the [facility] infusion center due to age addressed and she is actively being managed by a [facility] provider.    After discussing the infusion's costs, potential benefits and side effects, the patient has decided to accept treatment with monoclonal antibodies.

## 2019-01-06 ENCOUNTER — Ambulatory Visit (HOSPITAL_COMMUNITY)
Admission: RE | Admit: 2019-01-06 | Discharge: 2019-01-06 | Disposition: A | Discharge status: Home / Self Care | Payer: Medicare Other | Source: Ambulatory Visit | Attending: Pulmonary Disease | Admitting: Pulmonary Disease

## 2019-01-06 DIAGNOSIS — Z23 Encounter for immunization: Secondary | ICD-10-CM | POA: Insufficient documentation

## 2019-01-06 DIAGNOSIS — U071 COVID-19: Secondary | ICD-10-CM

## 2019-01-06 MED ORDER — FAMOTIDINE IN NACL 20-0.9 MG/50ML-% IV SOLN: 20.0000 mg | Freq: Once | INTRAVENOUS | Status: DC | PRN

## 2019-01-06 MED ORDER — METHYLPREDNISOLONE SODIUM SUCC 125 MG IJ SOLR: 125.0000 mg | Freq: Once | INTRAMUSCULAR | Status: DC | PRN

## 2019-01-06 MED ORDER — EPINEPHRINE 0.3 MG/0.3ML IJ SOAJ: 0.3000 mg | Freq: Once | INTRAMUSCULAR | Status: DC | PRN

## 2019-01-06 MED ORDER — ALBUTEROL SULFATE HFA 108 (90 BASE) MCG/ACT IN AERS: 2.0000 | INHALATION_SPRAY | Freq: Once | RESPIRATORY_TRACT | Status: DC | PRN

## 2019-01-06 MED ORDER — DIPHENHYDRAMINE HCL 50 MG/ML IJ SOLN: 50.0000 mg | Freq: Once | INTRAMUSCULAR | Status: DC | PRN

## 2019-01-06 MED ORDER — SODIUM CHLORIDE 0.9 % IV SOLN
INTRAVENOUS | Status: DC | PRN
  Administered 2019-01-06: 11:00:00 250 mL via INTRAVENOUS

## 2019-01-06 MED ORDER — SODIUM CHLORIDE 0.9 % IV SOLN
700.0000 mg | Freq: Once | INTRAVENOUS | Status: AC
  Administered 2019-01-06: 12:00:00 700 mg via INTRAVENOUS
  Filled 2019-01-06: qty 20

## 2019-01-06 NOTE — Progress Notes (Addendum)
  Diagnosis: COVID-19  Physician: Dr. Loura Pardon  Procedure: Covid Infusion Clinic Med: bamlanivimab infusion - Provided patient with bamlanimivab fact sheet for patients, parents and caregivers prior to infusion.  Complications: No immediate complications noted.  Discharge: Discharged home   [place] 01/06/2019

## 2019-01-08 ENCOUNTER — Other Ambulatory Visit: Payer: Self-pay | Admitting: Internal Medicine

## 2019-04-02 ENCOUNTER — Ambulatory Visit: Payer: Medicare Other | Attending: Internal Medicine

## 2019-04-02 DIAGNOSIS — Z23 Encounter for immunization: Secondary | ICD-10-CM | POA: Insufficient documentation

## 2019-04-02 NOTE — Progress Notes (Signed)
   Covid-19 Vaccination Clinic  Name:  Rachel Carlson    MRN: [id number] DOB: [birth year]  04/02/2019  [name] was observed post Covid-19 immunization for 15 minutes without incident. She was provided with Vaccine Information Sheet and instruction to access the V-Safe system.   [name] was instructed to call 911 with any severe reactions post vaccine: Marland Kitchen Difficulty breathing  . Swelling of face and throat  . A fast heartbeat  . A bad rash all over body  . Dizziness and weakness   Immunizations Administered    Name Date Dose VIS Date Route   Moderna COVID-19 Vaccine 04/02/2019  8:43 AM 0.5 mL 12/30/2018 Intramuscular   Manufacturer: Moderna   Lot: [id number]   [place]: [id number]

## 2019-04-09 ENCOUNTER — Telehealth: Payer: Self-pay

## 2019-04-09 ENCOUNTER — Other Ambulatory Visit: Payer: Self-pay | Admitting: Family Medicine

## 2019-04-09 DIAGNOSIS — E2839 Other primary ovarian failure: Secondary | ICD-10-CM

## 2019-04-09 DIAGNOSIS — Z1231 Encounter for screening mammogram for malignant neoplasm of breast: Secondary | ICD-10-CM

## 2019-04-09 NOTE — Telephone Encounter (Signed)
Pt request that order be put in for pt to have a bone density; 08/07/2018 stopped Evista, no falls or fx. Next dexa due 05/2019. Pt will call herself and schedule mammogram and bone density; pt said same place she had last bone density on [address] in [place] near [place]; thinks was [facility]. Pt request cb when ordered.

## 2019-04-09 NOTE — Telephone Encounter (Signed)
Please place order for Bone Density exam.  Pt had [facility].

## 2019-04-09 NOTE — Telephone Encounter (Signed)
I did the order

## 2019-04-10 NOTE — Telephone Encounter (Signed)
Pt scheduled for DEXA 07-20-19.

## 2019-04-16 DIAGNOSIS — E663 Overweight: Secondary | ICD-10-CM | POA: Insufficient documentation

## 2019-04-16 DIAGNOSIS — Z7189 Other specified counseling: Secondary | ICD-10-CM | POA: Insufficient documentation

## 2019-04-16 DIAGNOSIS — I340 Nonrheumatic mitral (valve) insufficiency: Secondary | ICD-10-CM | POA: Insufficient documentation

## 2019-04-16 NOTE — Progress Notes (Signed)
Cardiology Office Note   Date:  04/17/2019   ID:  Rachel Carlson, DOB 08-May-1945, MRN [id number]  PCP:  Abner Greenspan, MD  Cardiologist:   Minus Breeding, MD    Chief Complaint  Patient presents with  . PVCs      History of Present Illness: Rachel Carlson is a 74 y.o. female who presents for a history of palpitations and PVCs.  She was previously seen by Dr. Aundra Dubin.  She has a family history of sudden cardiac death and CAD.  She had an echo in 2010 which was essentially unremarkable and a Holter that demonstrated PVCs.    She had dyspnea when I saw her last year.  She had a negative POET (Plain Old Exercise Treadmill) . Echo in 2019 and there was mild MR.     Since I last saw her she had Covid earlier this year.  Thankfully she got monoclonal antibody.  She did relatively well although her husband was fairly sick.  He also got monoclonal antibody.  She since has done okay and has not had any further complaints of increasing shortness of breath, PND or orthopnea.  She said no chest pressure, neck or arm discomfort.  She has had no change in her symptoms since her last stress test.   Past Medical History:  Diagnosis Date  . Blood transfusion without reported diagnosis   . Chronic right ear pain    normal MRI  . Esophageal stricture   . Family history of breast cancer   . Family history of colon cancer   . Family history of melanoma   . Family history of prostate cancer   . Fibroids    uterine, history of  . GERD (gastroesophageal reflux disease)   . History of uterine prolapse   . Hx of adenomatous polyp of colon 10/19/2014  . Hyperlipidemia   . Obesity   . PVC (premature ventricular contraction)    first dx by holter in 1980's, echo (6/10) EF 82-64%, normal diastolic fxn, normal size RV and fxn, mild MR, PASP 70mHg  . Tubal pregnancy   . 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 Urge urinary incontinence 06/03/2007   Qualifier: Diagnosis of  By: BMarcelino ScotCMA,  AAuburn Bilberry     Past Surgical History:  Procedure Laterality Date  . BLADDER NECK SUSPENSION  1998  . BREAST BIOPSY Left   . COLONOSCOPY  05/18/2011   Dr. CSilvano Rusk . ECTOPIC PREGNANCY SURGERY  1980  . ESOPHAGOGASTRODUODENOSCOPY (EGD) WITH ESOPHAGEAL DILATION  04/03/12   Dr. CSilvano Rusk . PARTIAL HYSTERECTOMY     abdominal, fibroids, and prolapse (1998) bladder tack  . UPPER GASTROINTESTINAL ENDOSCOPY       Current Outpatient Medications  Medication Sig Dispense Refill  . aspirin 81 MG tablet Take 81 mg by mouth daily.      . calcium citrate-vitamin D (CITRACAL+D) 315-200 MG-UNIT per tablet Take 1 tablet by mouth 2 (two) times daily.     . Cholecalciferol (VITAMIN D-3) 5000 UNITS TABS 3,000 Units daily.     . Coenzyme Q10 (CO Q 10 PO) Take 400 Units by mouth daily.      . COLOCORT 100 MG/60ML enema PLACE ONE ENEMA RECTALLY EVERY DAY AT BEDTIME 1680 mL 0  . Cranberry 300 MG tablet Take 300 mg by mouth daily.      . folic acid (FOLVITE) 1 MG tablet Take 1 tablet (1 mg total) by mouth daily. 30 tablet 11  .  Glucosamine-Chondroitin (GLUCOSAMINE CHONDR COMPLEX PO) Take 2 capsules by mouth daily.     . Grape Seed Extract 100 MG CAPS Take 1 capsule by mouth daily.     . lansoprazole (PREVACID) 15 MG capsule Take 1 capsule (15 mg total) by mouth 2 (two) times daily before a meal.    . LIALDA 1.2 g EC tablet Take 2 tablets by mouth twice daily 120 tablet 1  . Omega-3 Fatty Acids (FISH OIL TRIPLE STRENGTH) 1400 MG CAPS Take by mouth daily.      . rosuvastatin (CRESTOR) 20 MG tablet Take 1 tablet (20 mg total) by mouth at bedtime. 90 tablet 3  . VITAMIN B COMPLEX-C CAPS Take 1 capsule by mouth daily.      No current facility-administered medications for this visit.    Allergies:   Pantoprazole sodium, Ranitidine, Omeprazole, and Sulfasalazine  ROS:  Please see the history of present illness.   Otherwise, review of systems are positive for none.   All other systems are reviewed and  negative.    PHYSICAL EXAM: VS:  BP 130/80   Pulse 71   Temp 98.1 F (36.7 C) (Temporal)   Resp 15   Ht 5' 2.5" (1.588 m)   Wt 206 lb 3.2 oz (93.5 kg)   LMP 05/29/1996   SpO2 97%   BMI 37.11 kg/m  , BMI Body mass index is 37.11 kg/m.  GENERAL:  Well appearing NECK:  No jugular venous distention, waveform within normal limits, carotid upstroke brisk and symmetric, no bruits, no thyromegaly LUNGS:  Clear to auscultation bilaterally CHEST:  Unremarkable HEART:  PMI not displaced or sustained,S1 and S2 within normal limits, no S3, no S4, no clicks, no rubs, none murmurs ABD:  Flat, positive bowel sounds normal in frequency in pitch, no bruits, no rebound, no guarding, no midline pulsatile mass, no hepatomegaly, no splenomegaly EXT:  2 plus pulses throughout, trace edema, no cyanosis no clubbing   EKG:  EKG is acute ordered today. The ekg ordered 10/26/15 demonstrates normal sinus rhythm, rate 71, axis within normal limits, intervals within normal limits, no acute ST-T wave changes.  Premature ectopic complexes   Recent Labs: 08/06/2018: BUN 11; Creatinine, Ser 0.64; Potassium 4.3; Sodium 141 08/07/2018: Hemoglobin 13.1; Platelets 206.0; TSH 3.24    Lipid Panel    Component Value Date/Time   CHOL 145 08/07/2018 1029   TRIG 111.0 08/07/2018 1029   HDL 61.00 08/07/2018 1029   CHOLHDL 2 08/07/2018 1029   VLDL 22.2 08/07/2018 1029   LDLCALC 61 08/07/2018 1029   LDLDIRECT 257.3 06/04/2008 1005     Wt Readings from Last 3 Encounters:  04/17/19 206 lb 3.2 oz (93.5 kg)  08/07/18 198 lb 8 oz (90 kg)  04/11/18 196 lb 6.4 oz (89.1 kg)      Other studies Reviewed: Additional studies/ records that were reviewed today include: Labs Review of the above records demonstrates:   See elsewhere   ASSESSMENT AND PLAN:  DYSPNEA:   This is baseline.  She cannot lose weight and has not been as active but otherwise has had no significant difference in her breathing.  No change in therapy is  indicated.   DYSLIPIDEMIA:    Her LDL 61 with an HDL of 61.  No change in therapy.   HTN:   Her blood pressure is at target.  No change in therapy.    MR:  She had mild MR in 2019.  I will follow this clinically.  COVID EDUCATION:  She has had her vaccine.  Current medicines are reviewed at length with the patient today.  The patient does not have concerns regarding medicines.  The following changes have been made:  None  Labs/ tests ordered today include:  None  Orders Placed This Encounter  Procedures  . EKG 12-Lead     Disposition:   FU with me in one year.     Signed, Minus Breeding, MD  04/17/2019 5:39 PM    [facility]

## 2019-04-17 ENCOUNTER — Encounter: Payer: Self-pay | Admitting: Cardiology

## 2019-04-17 ENCOUNTER — Other Ambulatory Visit: Payer: Self-pay

## 2019-04-17 ENCOUNTER — Ambulatory Visit: Payer: Medicare Other | Admitting: Cardiology

## 2019-04-17 VITALS — BP 130/80 | HR 71 | Temp 98.1°F | Resp 15 | Ht 62.5 in | Wt 206.2 lb

## 2019-04-17 DIAGNOSIS — I10 Essential (primary) hypertension: Secondary | ICD-10-CM

## 2019-04-17 DIAGNOSIS — R0602 Shortness of breath: Secondary | ICD-10-CM | POA: Diagnosis not present

## 2019-04-17 DIAGNOSIS — I340 Nonrheumatic mitral (valve) insufficiency: Secondary | ICD-10-CM

## 2019-04-17 DIAGNOSIS — E663 Overweight: Secondary | ICD-10-CM | POA: Diagnosis not present

## 2019-04-17 DIAGNOSIS — E785 Hyperlipidemia, unspecified: Secondary | ICD-10-CM

## 2019-04-17 DIAGNOSIS — Z7189 Other specified counseling: Secondary | ICD-10-CM

## 2019-04-17 NOTE — Patient Instructions (Signed)
Medication Instructions:  No changes *If you need a refill on your cardiac medications before your next appointment, please call your pharmacy*   Lab Work: None needed this visit  Testing/Procedures: None needed this visit  Follow-Up: At [facility], you and your health needs are our priority.  As part of our continuing mission to provide you with exceptional heart care, we have created designated Provider Care Teams.  These Care Teams include your primary Cardiologist (physician) and Advanced Practice Providers (APPs -  Physician Assistants and Nurse Practitioners) who all work together to provide you with the care you need, when you need it.   Your next appointment:   1 year(s)  The format for your next appointment:   In Person  Provider:   Minus Breeding, MD  Other Instructions Game Changer - Netflix  Coronary Calcium Score - for your son  We recommend signing up for the patient portal called "MyChart".  Sign up information is provided on this After Visit Summary.  MyChart is used to connect with patients for Virtual Visits (Telemedicine).  Patients are able to view lab/test results, encounter notes, upcoming appointments, etc.  Non-urgent messages can be sent to your provider as well.

## 2019-05-05 ENCOUNTER — Ambulatory Visit: Payer: Medicare Other | Attending: Internal Medicine

## 2019-05-05 DIAGNOSIS — Z23 Encounter for immunization: Secondary | ICD-10-CM

## 2019-05-05 NOTE — Progress Notes (Signed)
   Covid-19 Vaccination Clinic  Name:  Rachel Carlson    MRN: [id number] DOB: [birth year]  05/05/2019  [name] was observed post Covid-19 immunization for 15 minutes without incident. She was provided with Vaccine Information Sheet and instruction to access the V-Safe system.   [name] was instructed to call 911 with any severe reactions post vaccine: Marland Kitchen Difficulty breathing  . Swelling of face and throat  . A fast heartbeat  . A bad rash all over body  . Dizziness and weakness   Immunizations Administered    Name Date Dose VIS Date Route   Moderna COVID-19 Vaccine 05/05/2019  8:22 AM 0.5 mL 12/30/2018 Intramuscular   Manufacturer: Moderna   Lot: [id number]   [place]: [phone]

## 2019-05-20 ENCOUNTER — Other Ambulatory Visit: Payer: Self-pay | Admitting: Internal Medicine

## 2019-05-20 NOTE — Telephone Encounter (Signed)
Called and set her an appointment up for May.

## 2019-06-16 ENCOUNTER — Encounter: Payer: Self-pay | Admitting: Internal Medicine

## 2019-06-16 ENCOUNTER — Ambulatory Visit: Payer: Medicare Other | Admitting: Internal Medicine

## 2019-06-16 VITALS — BP 122/74 | HR 86 | Ht 62.5 in | Wt 201.0 lb

## 2019-06-16 DIAGNOSIS — R1032 Left lower quadrant pain: Secondary | ICD-10-CM | POA: Diagnosis not present

## 2019-06-16 DIAGNOSIS — Z86010 Personal history of colonic polyps: Secondary | ICD-10-CM

## 2019-06-16 DIAGNOSIS — K5130 Ulcerative (chronic) rectosigmoiditis without complications: Secondary | ICD-10-CM | POA: Diagnosis not present

## 2019-06-16 DIAGNOSIS — R300 Dysuria: Secondary | ICD-10-CM | POA: Diagnosis not present

## 2019-06-16 MED ORDER — LIALDA 1.2 G PO TBEC: DELAYED_RELEASE_TABLET | ORAL | 11 refills | Status: DC

## 2019-06-16 NOTE — Patient Instructions (Signed)
  We have sent the following medications to your pharmacy for you to pick up at your convenience: [place] your urinary symptoms with your PCP.   If your abdominal pain doesn't get better or it worsens please call us back.   I appreciate the opportunity to care for you. Silvano Rusk, MD, [facility]

## 2019-06-16 NOTE — Progress Notes (Signed)
Rachel Carlson 74 y.o. 1945-11-21 [id number]  Assessment & Plan:   Encounter Diagnoses  Name Primary?  . 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 Left lower quadrant abdominal pain   . Hx of adenomatous polyp of colon   . Dysuria     She will monitor this lower quadrant abdominal pain.  Her exam is inconsistent for abdominal wall pain but I still am somewhat suspicious of that.  It is improving, so we will watch it and hopefully it will.  If it persists or worsen she will let me know, could need to consider a CT scan.  Colonoscopy in September routinely  I have advised her to discuss this dysuria with her primary care provider, I wonder if it could be an atrophic vaginitis issue, it does not sound like it involves the bladder or the kidneys.  We discussed the urinalysis but she is asymptomatic at this time so elected not to.  This could be considered.  She is on long-term mesalamine which she is continuing, which can cause a cystitis but again no bleeding and the symptom pattern does not sound like that.  Kidney function has been stable she is due for routine repeat annual annual labs in July.  Creatinine normal last year.  Recall that she has a family history of Lynch syndrome and son but her testing was negative  CC: Tower, Wynelle Fanny, MD  Subjective:   Chief Complaint: Follow-up of ulcerative proctosigmoiditis, lower abdominal pain  HPI Patient is here for follow-up of her ulcerative proctosigmoiditis which she reports is under good control without diarrhea or bleeding.  She does have abdominal pain for the past several days. She was doing housework last weekend and then developed a right lower quadrant pain that radiated across to the left lower quadrant and sort of settled there.  It is tender to the touch but not hurting like it was.  She does not recall twisting or injuring herself but said the pain was worse with bending over.  Bowel habits, gas etc.  not associated with this.  Another problem she has been having was in early evening burning at the tip of her urethra that would occur for days on and and then stop.  She says it is right where the urethra exits in the genital area.  Applying a cool compress seems to help.  She recently stopped all of her medicines with the intention to add these back in to see if it is one of them causing it.  No hematuria, and she says this does not remind her of the cystitis.  She has a history of an adenoma and with the proctosigmoiditis I had anticipated a repeat colonoscopy 5 years from the last which will be in September of this year. Allergies  Allergen Reactions  . Pantoprazole Sodium     Abdominal pain, diarrhea  . Ranitidine     Diarrhea, abdominal pain  . Omeprazole Diarrhea    Abdominal pain also   . Sulfasalazine Rash    Fever, chills, headache, muscle pain   Current Meds  Medication Sig  . aspirin 81 MG tablet Take 81 mg by mouth daily.    . calcium citrate-vitamin D (CITRACAL+D) 315-200 MG-UNIT per tablet Take 1 tablet by mouth 2 (two) times daily.   . Cholecalciferol (VITAMIN D-3) 5000 UNITS TABS 3,000 Units daily.   . Coenzyme Q10 (CO Q 10 PO) Take 400 Units by mouth daily.    . COLOCORT 100 MG/60ML  enema PLACE ONE ENEMA RECTALLY EVERY DAY AT BEDTIME  . Cranberry 300 MG tablet Take 300 mg by mouth daily.    . Glucosamine-Chondroitin (GLUCOSAMINE CHONDR COMPLEX PO) Take 2 capsules by mouth daily.   . Grape Seed Extract 100 MG CAPS Take 1 capsule by mouth daily.   . lansoprazole (PREVACID) 15 MG capsule Take 1 capsule (15 mg total) by mouth 2 (two) times daily before a meal.  . LIALDA 1.2 g EC tablet Take 2 tablets by mouth twice daily.  . milk thistle 175 MG tablet Take 175 mg by mouth daily.  . Omega-3 Fatty Acids (FISH OIL TRIPLE STRENGTH) 1400 MG CAPS Take by mouth daily.    . rosuvastatin (CRESTOR) 20 MG tablet Take 1 tablet (20 mg total) by mouth at bedtime.  . vitamin B-12  (CYANOCOBALAMIN) 1000 MCG tablet Take 1,000 mcg by mouth daily.   Past Medical History:  Diagnosis Date  . Blood transfusion without reported diagnosis   . Chronic right ear pain    normal MRI  . Esophageal stricture   . Family history of breast cancer   . Family history of colon cancer   . Family history of melanoma   . Family history of prostate cancer   . Fibroids    uterine, history of  . GERD (gastroesophageal reflux disease)   . History of uterine prolapse   . Hx of adenomatous polyp of colon 10/19/2014  . Hyperlipidemia   . Obesity   . PVC (premature ventricular contraction)    first dx by holter in 1980's, echo (6/10) EF 62-83%, normal diastolic fxn, normal size RV and fxn, mild MR, PASP 31mHg  . Tubal pregnancy   . 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 Urge urinary incontinence 06/03/2007   Qualifier: Diagnosis of  By: BMarcelino ScotCMA, AAuburn Bilberry    Past Surgical History:  Procedure Laterality Date  . BLADDER NECK SUSPENSION  1998  . BREAST BIOPSY Left   . COLONOSCOPY  05/18/2011   Dr. CSilvano Rusk . ECTOPIC PREGNANCY SURGERY  1980  . ESOPHAGOGASTRODUODENOSCOPY (EGD) WITH ESOPHAGEAL DILATION  04/03/12   Dr. CSilvano Rusk . PARTIAL HYSTERECTOMY     abdominal, fibroids, and prolapse (1998) bladder tack  . 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)   family history includes Arrhythmia in her brother; Breast cancer in her sister; Colon cancer in her cousin and another family member; Colon cancer (age of onset: 369 in her son; Colon cancer (age of onset: 329 in her brother; Colon polyps in her sister; Congestive Heart Failure in her maternal grandmother; Coronary artery disease in her mother; Diabetes in her mother; Diabetes type II in her mother; Ehlers-Danlos syndrome in her daughter; Heart attack (age of onset: 724 in her mother; Melanoma in her maternal uncle; Other in her son;  Prostate cancer in her maternal grandfather; Skin cancer in her maternal aunt and mother; Sudden death in her mother; Sudden death (age of onset: 482 in her father; Transient ischemic attack in her mother; Tuberculosis in her paternal grandfather.   Review of Systems As above  Objective:   Physical Exam BP 122/74   Pulse 86   Ht 5' 2.5" (1.588 m)   Wt 201 lb (91.2 kg)   LMP 05/29/1996   BMI 36.18 kg/m  Obese elderly white woman no acute distress She does have some mild tenderness in  the left lower quadrant that is not completely reproducible.  It is initially more tender and then not so with leg raise and abdominal wall tension.  No mass. '

## 2019-07-20 ENCOUNTER — Ambulatory Visit
Admission: RE | Admit: 2019-07-20 | Discharge: 2019-07-20 | Disposition: A | Discharge status: Home / Self Care | Payer: Medicare Other | Source: Ambulatory Visit | Attending: Family Medicine | Admitting: Family Medicine

## 2019-07-20 ENCOUNTER — Other Ambulatory Visit: Payer: Self-pay

## 2019-07-20 DIAGNOSIS — M8589 Other specified disorders of bone density and structure, multiple sites: Secondary | ICD-10-CM | POA: Diagnosis not present

## 2019-07-20 DIAGNOSIS — E2839 Other primary ovarian failure: Secondary | ICD-10-CM

## 2019-07-20 DIAGNOSIS — Z1231 Encounter for screening mammogram for malignant neoplasm of breast: Secondary | ICD-10-CM

## 2019-07-20 DIAGNOSIS — Z780 Asymptomatic menopausal state: Secondary | ICD-10-CM | POA: Diagnosis not present

## 2019-07-24 ENCOUNTER — Encounter: Payer: Self-pay | Admitting: *Deleted

## 2019-08-06 ENCOUNTER — Telehealth: Payer: Self-pay | Admitting: Family Medicine

## 2019-08-06 DIAGNOSIS — E7800 Pure hypercholesterolemia, unspecified: Secondary | ICD-10-CM

## 2019-08-06 DIAGNOSIS — E559 Vitamin D deficiency, unspecified: Secondary | ICD-10-CM

## 2019-08-06 DIAGNOSIS — M810 Age-related osteoporosis without current pathological fracture: Secondary | ICD-10-CM

## 2019-08-06 DIAGNOSIS — R7303 Prediabetes: Secondary | ICD-10-CM

## 2019-08-06 DIAGNOSIS — I10 Essential (primary) hypertension: Secondary | ICD-10-CM

## 2019-08-06 NOTE — Telephone Encounter (Signed)
-----   Message from Cloyd Stagers, RT sent at 07/22/2019  2:41 PM EDT ----- Regarding: Lab Orders for Friday 7.9.2021 Please place lab orders for  Friday 7.9.2021, office visit for physical on Wednesday 7.14.2021 Thank you, Dyke Maes RT(R)

## 2019-08-07 ENCOUNTER — Other Ambulatory Visit: Payer: Self-pay

## 2019-08-07 ENCOUNTER — Other Ambulatory Visit (INDEPENDENT_AMBULATORY_CARE_PROVIDER_SITE_OTHER): Payer: Medicare Other

## 2019-08-07 DIAGNOSIS — E7800 Pure hypercholesterolemia, unspecified: Secondary | ICD-10-CM | POA: Diagnosis not present

## 2019-08-07 DIAGNOSIS — I10 Essential (primary) hypertension: Secondary | ICD-10-CM

## 2019-08-07 DIAGNOSIS — R7303 Prediabetes: Secondary | ICD-10-CM

## 2019-08-07 DIAGNOSIS — E559 Vitamin D deficiency, unspecified: Secondary | ICD-10-CM | POA: Diagnosis not present

## 2019-08-07 LAB — COMPREHENSIVE METABOLIC PANEL
ALT: 16 U/L (ref 0–35)
AST: 15 U/L (ref 0–37)
Albumin: 4.5 g/dL (ref 3.5–5.2)
Alkaline Phosphatase: 57 U/L (ref 39–117)
BUN: 10 mg/dL (ref 6–23)
CO2: 26 mEq/L (ref 19–32)
Calcium: 9.9 mg/dL (ref 8.4–10.5)
Chloride: 106 mEq/L (ref 96–112)
Creatinine, Ser: 0.66 mg/dL (ref 0.40–1.20)
GFR: 88.13 mL/min (ref >60.00)
Glucose, Bld: 110 mg/dL — ABNORMAL HIGH (ref 70–99)
Potassium: 4.5 mEq/L (ref 3.5–5.1)
Sodium: 140 mEq/L (ref 135–145)
Total Bilirubin: 0.7 mg/dL (ref 0.2–1.2)
Total Protein: 6.7 g/dL (ref 6.0–8.3)

## 2019-08-07 LAB — LIPID PANEL
Cholesterol: 148 mg/dL (ref 0–200)
HDL: 62.6 mg/dL (ref >39.00)
LDL Cholesterol: 59 mg/dL (ref 0–99)
NonHDL: 85.47
Total CHOL/HDL Ratio: 2
Triglycerides: 131 mg/dL (ref 0.0–149.0)
VLDL: 26.2 mg/dL (ref 0.0–40.0)

## 2019-08-07 LAB — CBC WITH DIFFERENTIAL/PLATELET
Basophils Absolute: 0.1 10*3/uL (ref 0.0–0.1)
Basophils Relative: 1.2 % (ref 0.0–3.0)
Eosinophils Absolute: 0.1 10*3/uL (ref 0.0–0.7)
Eosinophils Relative: 1.8 % (ref 0.0–5.0)
HCT: 39.8 % (ref 36.0–46.0)
Hemoglobin: 13.5 g/dL (ref 12.0–15.0)
Lymphocytes Relative: 31.3 % (ref 12.0–46.0)
Lymphs Abs: 1.3 10*3/uL (ref 0.7–4.0)
MCHC: 34 g/dL (ref 30.0–36.0)
MCV: 88.6 fl (ref 78.0–100.0)
Monocytes Absolute: 0.4 10*3/uL (ref 0.1–1.0)
Monocytes Relative: 9.5 % (ref 3.0–12.0)
Neutro Abs: 2.3 10*3/uL (ref 1.4–7.7)
Neutrophils Relative %: 56.2 % (ref 43.0–77.0)
Platelets: 182 10*3/uL (ref 150.0–400.0)
RBC: 4.49 Mil/uL (ref 3.87–5.11)
RDW: 14 % (ref 11.5–15.5)
WBC: 4.1 10*3/uL (ref 4.0–10.5)

## 2019-08-07 LAB — VITAMIN D 25 HYDROXY (VIT D DEFICIENCY, FRACTURES): VITD: 27.73 ng/mL — ABNORMAL LOW (ref 30.00–100.00)

## 2019-08-07 LAB — TSH: TSH: 2.69 u[IU]/mL (ref 0.35–4.50)

## 2019-08-07 LAB — HEMOGLOBIN A1C: Hgb A1c MFr Bld: 6.2 % (ref 4.6–6.5)

## 2019-08-10 ENCOUNTER — Ambulatory Visit: Payer: Medicare Other

## 2019-08-12 ENCOUNTER — Other Ambulatory Visit: Payer: Self-pay

## 2019-08-12 ENCOUNTER — Ambulatory Visit (INDEPENDENT_AMBULATORY_CARE_PROVIDER_SITE_OTHER): Payer: Medicare Other | Admitting: Family Medicine

## 2019-08-12 ENCOUNTER — Encounter: Payer: Self-pay | Admitting: Family Medicine

## 2019-08-12 VITALS — BP 124/68 | HR 67 | Temp 97.0°F | Ht 62.0 in | Wt 204.5 lb

## 2019-08-12 DIAGNOSIS — M810 Age-related osteoporosis without current pathological fracture: Secondary | ICD-10-CM

## 2019-08-12 DIAGNOSIS — Z0000 Encounter for general adult medical examination without abnormal findings: Secondary | ICD-10-CM | POA: Diagnosis not present

## 2019-08-12 DIAGNOSIS — R7303 Prediabetes: Secondary | ICD-10-CM

## 2019-08-12 DIAGNOSIS — E559 Vitamin D deficiency, unspecified: Secondary | ICD-10-CM

## 2019-08-12 DIAGNOSIS — Z86010 Personal history of colonic polyps: Secondary | ICD-10-CM

## 2019-08-12 DIAGNOSIS — K5130 Ulcerative (chronic) rectosigmoiditis without complications: Secondary | ICD-10-CM

## 2019-08-12 DIAGNOSIS — Z6837 Body mass index (BMI) 37.0-37.9, adult: Secondary | ICD-10-CM

## 2019-08-12 DIAGNOSIS — I10 Essential (primary) hypertension: Secondary | ICD-10-CM

## 2019-08-12 DIAGNOSIS — E7800 Pure hypercholesterolemia, unspecified: Secondary | ICD-10-CM

## 2019-08-12 MED ORDER — ROSUVASTATIN CALCIUM 20 MG PO TABS: 20.0000 mg | ORAL_TABLET | Freq: Every day | ORAL | 3 refills | Status: DC

## 2019-08-12 NOTE — Patient Instructions (Addendum)
Work on low impact exercise  An exercise bike helps knee condition   You will be due for a colonoscopy in sept -don't forget to call the GI office in august   I want you to take at least 5000 iu per day of vitamin D  For diabetes prevention Try to get most of your carbohydrates from produce (with the exception of white potatoes)  Eat less bread/pasta/rice/snack foods/cereals/sweets and other items from the middle of the grocery store (processed carbs)

## 2019-08-12 NOTE — Assessment & Plan Note (Signed)
Disc goals for lipids and reasons to control them Rev last labs with pt Rev low sat fat diet in detail Controlled with crestor and diet

## 2019-08-12 NOTE — Assessment & Plan Note (Signed)
Due for 5 y colonoscopy in the fall Pt will schedule  No current symptoms

## 2019-08-12 NOTE — Assessment & Plan Note (Signed)
Lab Results  Component Value Date   HGBA1C 6.2 08/07/2019   disc imp of low glycemic diet and wt loss to prevent DM2

## 2019-08-12 NOTE — Assessment & Plan Note (Signed)
Discussed how this problem influences overall health and the risks it imposes  Reviewed plan for weight loss with lower calorie diet (via better food choices and also portion control or program like weight watchers) and exercise building up to or more than 30 minutes 5 days per week including some aerobic activity    

## 2019-08-12 NOTE — Progress Notes (Signed)
Subjective:    Patient ID: Rachel Carlson, female    DOB: 1945-07-16, 74 y.o.   MRN: [id number]  This visit occurred during the SARS-CoV-2 public health emergency.  Safety protocols were in place, including screening questions prior to the visit, additional usage of staff PPE, and extensive cleaning of exam room while observing appropriate contact time as indicated for disinfecting solutions.    HPI  Here for health maintenance exam and to review chronic medical problems    Wt Readings from Last 3 Encounters:  08/12/19 204 lb 8 oz (92.8 kg)  06/16/19 201 lb (91.2 kg)  04/17/19 206 lb 3.2 oz (93.5 kg)   37.40 kg/m   Has been feeling ok overall  Trying to take care of herself  Cares for husband with ALS  Exercise- housework and working in the garden  Walking is troublesome due to knee pain (old meniscal tear)    Her amw was re scheduled for next week   Colonoscopy 9/16 with 5 y recall  Personal h/o UC Son had colon cancer at ae 21  Mammogram 6/21 Sister had breast cancer  Self breast exam-no lumps or changes / not good about exams   dexa 6/21-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 S/p 5 y course of evista  Falls-none fx -none  Supplements  D level is low 27.7- struggles with that Dewaine Conger ca and D  Exercise -working outdoors  She does take pi from GI   covid immunized  Had shingrix also    HTN bp is stable today  No cp or palpitations or headaches or edema  No medicines currently  BP Readings from Last 3 Encounters:  08/12/19 124/68  06/16/19 122/74  04/17/19 130/80     Pulse Readings from Last 3 Encounters:  08/12/19 67  06/16/19 86  04/17/19 71    Prediabetes Lab Results  Component Value Date   HGBA1C 6.2 08/07/2019  up from 6.1  Need more time for self care -has to care for her husband   Hyperlipidemia Lab Results  Component Value Date   CHOL 148 08/07/2019   CHOL 145 08/07/2018   CHOL 162 07/09/2017   Lab Results  Component Value Date     HDL 62.60 08/07/2019   HDL 61.00 08/07/2018   HDL 49.40 07/09/2017   Lab Results  Component Value Date   LDLCALC 59 08/07/2019   LDLCALC 61 08/07/2018   [place] 88 07/09/2017   Lab Results  Component Value Date   TRIG 131.0 08/07/2019   TRIG 111.0 08/07/2018   TRIG 122.0 07/09/2017   Lab Results  Component Value Date   CHOLHDL 2 08/07/2019   CHOLHDL 2 08/07/2018   CHOLHDL 3 07/09/2017   Lab Results  Component Value Date   LDLDIRECT 257.3 06/04/2008   LDLDIRECT 203.9 06/04/2007   crestor and diet   Lab Results  Component Value Date   TSH 2.69 08/07/2019   Lab Results  Component Value Date   WBC 4.1 08/07/2019   HGB 13.5 08/07/2019   HCT 39.8 08/07/2019   MCV 88.6 08/07/2019   PLT 182.0 08/07/2019   Patient Active Problem List   Diagnosis Date Noted  . Educated about COVID-19 virus infection 04/16/2019  . Nonrheumatic mitral valve regurgitation 04/16/2019  . Skin lesion 08/07/2018  . Essential hypertension 04/11/2018  . SOB (shortness of breath) 04/11/2018  . PVC's (premature ventricular contractions) 04/11/2018  . Class 2 obesity with body mass index (BMI) of 37.0 to 37.9 in adult 12/06/2017  .  Genetic testing 09/26/2017  . Family history of breast cancer   . Family history of colon cancer   . Family history of prostate cancer   . Family history of melanoma   . Family history of Lynch syndrome - son 08/04/2017  . Prediabetes 07/20/2015  . Estrogen deficiency 04/27/2015  . GERD (gastroesophageal reflux disease) 03/23/2015  . Hx of adenomatous polyp of colon 10/19/2014  . 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 Pain in joint, pelvic region and thigh 07/24/2014  . Need for hepatitis C screening test 07/12/2014  . Pelvic pain in female 07/12/2014  . Vitamin D deficiency 07/03/2014  . Encounter for Medicare annual wellness exam 07/08/2013  . Osteoporosis 05/24/2013  . 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 Adnexal pain 11/15/2010  . Urge incontinence 11/15/2010  . Family history of coronary artery disease 10/15/2010  . Routine general medical examination at a health care facility 06/01/2010  . Study [place], [place] 06/06/2009  . Hyperlipidemia 06/03/2007  . Urge urinary incontinence 06/03/2007  . MIGRAINES, HX OF 06/03/2007   Past Medical History:  Diagnosis Date  . Blood transfusion without reported diagnosis   . Chronic right ear pain    normal MRI  . Esophageal stricture   . Family history of breast cancer   . Family history of colon cancer   . Family history of melanoma   . Family history of prostate cancer   . Fibroids    uterine, history of  . GERD (gastroesophageal reflux disease)   . History of uterine prolapse   . Hx of adenomatous polyp of colon 10/19/2014  . Hyperlipidemia   . Obesity   . PVC (premature ventricular contraction)    first dx by holter in 1980's, echo (6/10) EF 82-95%, normal diastolic fxn, normal size RV and fxn, mild MR, PASP 24mHg  . Tubal pregnancy   . Ulcerative [place]osigmoiditis [place]   . Urge urinary incontinence 06/03/2007   Qualifier: Diagnosis of  By: BMarcelino ScotCMA, AAuburn Bilberry    Past Surgical History:  Procedure Laterality Date  . BLADDER NECK SUSPENSION  1998  . BREAST BIOPSY Left   . COLONOSCOPY  05/18/2011   Dr. CSilvano Rusk . ECTOPIC PREGNANCY SURGERY  1980  . ESOPHAGOGASTRODUODENOSCOPY (EGD) WITH ESOPHAGEAL DILATION  04/03/12   Dr. CSilvano Rusk . PARTIAL HYSTERECTOMY     abdominal, fibroids, and prolapse (1998) bladder tack  . UPPER GASTROINTESTINAL ENDOSCOPY     Social History   Tobacco Use  . Smoking status: Never Smoker  . Smokeless tobacco: Never Used  Vaping Use  . Vaping Use: Never used  Substance Use Topics  . Alcohol use: No    Alcohol/week: 0.0 standard drinks    Comment: rarely beer  . Drug use: No   Family History  Problem Relation Age of Onset  . Sudden death Father 429      "coronary  arteriosclerosis" on death certificate  . Heart attack Mother 751 . Transient ischemic attack Mother   . Diabetes type II Mother   . Sudden death Mother        died age 72 . Diabetes Mother   . Coronary artery disease Mother   . Skin cancer Mother   . Arrhythmia Brother   . Colon cancer Brother 350 . Breast cancer Sister        dx in her 648s . Colon polyps Sister  adenomatous  . Colon cancer Son 43  . Melanoma Maternal Uncle   . Congestive Heart Failure Maternal Grandmother   . Prostate cancer Maternal Grandfather        dx in 32s  . Tuberculosis Paternal Grandfather   . Skin cancer Maternal Aunt   . Other Son        parotid gland tumor  . Ehlers-Danlos syndrome Daughter   . Colon cancer Cousin        mid 28s; maternal cousin  . Colon cancer Other        MGMs brother   Allergies  Allergen Reactions  . Pantoprazole Sodium     Abdominal pain, diarrhea  . Ranitidine     Diarrhea, abdominal pain  . Omeprazole Diarrhea    Abdominal pain also   . Sulfasalazine Rash    Fever, chills, headache, muscle pain   Current Outpatient Medications on File Prior to Visit  Medication Sig Dispense Refill  . aspirin 81 MG tablet Take 81 mg by mouth daily.      . calcium citrate-vitamin D (CITRACAL+D) 315-200 MG-UNIT per tablet Take 1 tablet by mouth 2 (two) times daily.     . Cholecalciferol (VITAMIN D-3) 5000 UNITS TABS 3,000 Units daily.     . Coenzyme Q10 (CO Q 10 PO) Take 400 Units by mouth daily.      . COLOCORT 100 MG/60ML enema PLACE ONE ENEMA RECTALLY EVERY DAY AT BEDTIME 1680 mL 0  . Cranberry 300 MG tablet Take 300 mg by mouth daily.      . Glucosamine-Chondroitin (GLUCOSAMINE CHONDR COMPLEX PO) Take 2 capsules by mouth daily.     . Grape Seed Extract 100 MG CAPS Take 1 capsule by mouth daily.     . lansoprazole (PREVACID) 15 MG capsule Take 1 capsule (15 mg total) by mouth 2 (two) times daily before a meal.    . LIALDA 1.2 g EC tablet Take 2 tablets by mouth twice  daily. 120 tablet 11  . milk thistle 175 MG tablet Take 175 mg by mouth daily.    . Omega-3 Fatty Acids (FISH OIL TRIPLE STRENGTH) 1400 MG CAPS Take by mouth daily.      . vitamin B-12 (CYANOCOBALAMIN) 1000 MCG tablet Take 1,000 mcg by mouth daily.     No current facility-administered medications on file prior to visit.    Review of Systems  Constitutional: Negative for activity change, appetite change, fatigue, fever and unexpected weight change.  HENT: Negative for congestion, ear pain, rhinorrhea, sinus pressure and sore throat.   Eyes: Negative for pain, redness and visual disturbance.  Respiratory: Negative for cough, shortness of breath and wheezing.   Cardiovascular: Negative for chest pain and palpitations.  Gastrointestinal: Negative for abdominal pain, blood in stool, constipation and diarrhea.  Endocrine: Negative for polydipsia and polyuria.  Genitourinary: Negative for dysuria, frequency and urgency.  Musculoskeletal: Positive for arthralgias. Negative for back pain and myalgias.  Skin: Negative for pallor and rash.  Allergic/Immunologic: Negative for environmental allergies.  Neurological: Negative for dizziness, syncope and headaches.  Hematological: Negative for adenopathy. Does not bruise/bleed easily.  Psychiatric/Behavioral: Negative for decreased concentration and dysphoric mood. The patient is not nervous/anxious.        Objective:   Physical Exam Constitutional:      General: She is not in acute distress.    Appearance: Normal appearance. She is well-developed. She is obese. She is not ill-appearing or diaphoretic.  HENT:     Head: Normocephalic  and atraumatic.     Right Ear: Tympanic membrane, ear canal and external ear normal.     Left Ear: Tympanic membrane, ear canal and external ear normal.     Nose: Nose normal. No congestion.     Mouth/Throat:     Mouth: Mucous membranes are moist.     Pharynx: Oropharynx is clear. No posterior oropharyngeal erythema.   Eyes:     General: No scleral icterus.    Extraocular Movements: Extraocular movements intact.     Conjunctiva/sclera: Conjunctivae normal.     Pupils: Pupils are equal, round, and reactive to light.  Neck:     Thyroid: No thyromegaly.     Vascular: No carotid bruit or JVD.  Cardiovascular:     Rate and Rhythm: Normal rate and regular rhythm.     Pulses: Normal pulses.     Heart sounds: Normal heart sounds. No gallop.   Pulmonary:     Effort: Pulmonary effort is normal. No respiratory distress.     Breath sounds: Normal breath sounds. No wheezing.     Comments: Good air exch Chest:     Chest wall: No tenderness.  Abdominal:     General: Bowel sounds are normal. There is no distension or abdominal bruit.     Palpations: Abdomen is soft. There is no mass.     Tenderness: There is no abdominal tenderness.     Hernia: No hernia is present.  Genitourinary:    Comments: Breast exam: No mass, nodules, thickening, tenderness, bulging, retraction, inflamation, nipple discharge or skin changes noted.  No axillary or clavicular LA.     Musculoskeletal:        General: No tenderness. Normal range of motion.     Cervical back: Normal range of motion and neck supple. No rigidity. No muscular tenderness.     Right lower leg: No edema.     Left lower leg: No edema.  Lymphadenopathy:     Cervical: No cervical adenopathy.  Skin:    General: Skin is warm and dry.     Coloration: Skin is not pale.     Findings: No erythema or rash.     Comments: Solar lentigines diffusely Some sks and angiomas  Neurological:     Mental Status: She is alert. Mental status is at baseline.     Cranial Nerves: No cranial nerve deficit.     Motor: No abnormal muscle tone.     Coordination: Coordination normal.     Gait: Gait normal.     Deep Tendon Reflexes: Reflexes are normal and symmetric. Reflexes normal.  Psychiatric:        Mood and Affect: Mood normal.        Cognition and Memory: Cognition and memory  normal.           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[place])    Due for 5 y colonoscopy in the fall Pt will schedule  No current symptoms        Musculoskeletal and Integument  Osteoporosis    dexa 6/21 reviewed-slt improvement at spine Re check in a year Past 5 y course of evista  No falls or fx Need to get D level up -inc dose to 4000 iu daily        Other   Hyperlipidemia    Disc goals for lipids and reasons to control them Rev last labs with pt Rev low sat fat diet in detail Controlled with crestor and diet       Relevant Medications   rosuvastatin (CRESTOR) 20 MG tablet   Routine general medical examination at a health care facility - Primary    Reviewed health habits including diet and exercise and skin cancer prevention Reviewed appropriate screening tests for age  Also reviewed health mt list, fam hx and immunization status , as well as social and family history   See HPI Labs reviewed  amw scheduled next week  Rev dexa  -no falls or fx  covid immunized  Zoster immunized Enc better diet/exercise and wt loss  Colonoscopy due in sept- fam hx cancer and she has UC      Vitamin D deficiency    Level 27.7 Will inc daily D3 to 4000 iu daily Disc imp for bone and overall health      Hx of adenomatous polyp of colon    Due for 5 y recall colonoscopy in sept Pt aware and will schedule      Prediabetes    Lab Results  Component Value Date   HGBA1C 6.2 08/07/2019   disc imp of low glycemic diet and wt loss to prevent DM2       Class 2 obesity with body  mass index (BMI) of 37.0 to 37.9 in adult    Discussed how this problem influences overall health and the risks it imposes  Reviewed plan for weight loss with lower calorie diet (via better food choices and also portion control or program like weight watchers) and exercise building up to or more than 30 minutes 5 days per week including some aerobic activity

## 2019-08-12 NOTE — Assessment & Plan Note (Signed)
Due for 5 y recall colonoscopy in sept Pt aware and will schedule

## 2019-08-12 NOTE — Assessment & Plan Note (Signed)
bp in fair control at this time  BP Readings from Last 1 Encounters:  08/12/19 124/68   No changes needed Most recent labs reviewed  Disc lifstyle change with low sodium diet and exercise

## 2019-08-12 NOTE — Assessment & Plan Note (Signed)
Reviewed health habits including diet and exercise and skin cancer prevention Reviewed appropriate screening tests for age  Also reviewed health mt list, fam hx and immunization status , as well as social and family history   See HPI Labs reviewed  amw scheduled next week  Rev dexa  -no falls or fx  covid immunized  Zoster immunized Enc better diet/exercise and wt loss  Colonoscopy due in sept- fam hx cancer and she has UC

## 2019-08-12 NOTE — Assessment & Plan Note (Signed)
Level 27.7 Will inc daily D3 to 4000 iu daily Disc imp for bone and overall health

## 2019-08-12 NOTE — Assessment & Plan Note (Signed)
dexa 6/21 reviewed-slt improvement at spine Re check in a year Past 5 y course of evista  No falls or fx Need to get D level up -inc dose to 4000 iu daily

## 2019-08-25 ENCOUNTER — Other Ambulatory Visit: Payer: Self-pay

## 2019-08-25 ENCOUNTER — Ambulatory Visit (INDEPENDENT_AMBULATORY_CARE_PROVIDER_SITE_OTHER): Payer: Medicare Other

## 2019-08-25 DIAGNOSIS — Z0000 Encounter for general adult medical examination without abnormal findings: Secondary | ICD-10-CM

## 2019-08-25 NOTE — Patient Instructions (Signed)
Rachel Carlson , Thank you for taking time to come for your Medicare Wellness Visit. I appreciate your ongoing commitment to your health goals. Please review the following plan we discussed and let me know if I can assist you in the future.   Screening recommendations/referrals: Colonoscopy: Up to date, completed 10/12/2014, due 09/2019 Mammogram: Up to date, completed 07/20/2019, due 06/2020 Bone Density: Up to date, completed 07/20/2019, due 06/2021 Recommended yearly ophthalmology/optometry visit for glaucoma screening and checkup Recommended yearly dental visit for hygiene and checkup  Vaccinations: Influenza vaccine: Up to date, completed 11/20/2018, due 08/2019 Pneumococcal vaccine: Completed series Tdap vaccine: Up to date, completed 10/31/2015, due 10/2025 Shingles vaccine: Completed series   Covid-19:Completed series  Advanced directives: Please bring a copy of your POA (Power of [place]) and/or Living Will to your next appointment.   Conditions/risks identified: hypertension, hyperlipidemia  Next appointment: Follow up in one year for your annual wellness visit    Preventive Care 72 Years and Older, Female Preventive care refers to lifestyle choices and visits with your health care provider that can promote health and wellness. What does preventive care include?  A yearly physical exam. This is also called an annual well check.  Dental exams once or twice a year.  Routine eye exams. Ask your health care provider how often you should have your eyes checked.  Personal lifestyle choices, including:  Daily care of your teeth and gums.  Regular physical activity.  Eating a healthy diet.  Avoiding tobacco and drug use.  Limiting alcohol use.  Practicing safe sex.  Taking low-dose aspirin every day.  Taking vitamin and mineral supplements as recommended by your health care provider. What happens during an annual well check? The services and screenings done by your  health care provider during your annual well check will depend on your age, overall health, lifestyle risk factors, and family history of disease. Counseling  Your health care provider may ask you questions about your:  Alcohol use.  Tobacco use.  Drug use.  Emotional well-being.  Home and relationship well-being.  Sexual activity.  Eating habits.  History of falls.  Memory and ability to understand (cognition).  Work and work Statistician.  Reproductive health. Screening  You may have the following tests or measurements:  Height, weight, and BMI.  Blood pressure.  Lipid and cholesterol levels. These may be checked every 5 years, or more frequently if you are over 72 years old.  Skin check.  Lung cancer screening. You may have this screening every year starting at age 72 if you have a 30-pack-year history of smoking and currently smoke or have quit within the past 15 years.  Fecal occult blood test (FOBT) of the stool. You may have this test every year starting at age 72.  Flexible sigmoidoscopy or colonoscopy. You may have a sigmoidoscopy every 5 years or a colonoscopy every 10 years starting at age 72.  Hepatitis C blood test.  Hepatitis B blood test.  Sexually transmitted disease (STD) testing.  Diabetes screening. This is done by checking your blood sugar (glucose) after you have not eaten for a while (fasting). You may have this done every 1-3 years.  Bone density scan. This is done to screen for osteoporosis. You may have this done starting at age 72.  Mammogram. This may be done every 1-2 years. Talk to your health care provider about how often you should have regular mammograms. Talk with your health care provider about your test results, treatment options, and  if necessary, the need for more tests. Vaccines  Your health care provider may recommend certain vaccines, such as:  Influenza vaccine. This is recommended every year.  Tetanus, diphtheria, and  acellular pertussis (Tdap, Td) vaccine. You may need a Td booster every 10 years.  Zoster vaccine. You may need this after age 72.  Pneumococcal 13-valent conjugate (PCV13) vaccine. One dose is recommended after age 72.  Pneumococcal polysaccharide (PPSV23) vaccine. One dose is recommended after age 72. Talk to your health care provider about which screenings and vaccines you need and how often you need them. This information is not intended to replace advice given to you by your health care provider. Make sure you discuss any questions you have with your health care provider. Document Released: 02/11/2015 Document Revised: 10/05/2015 Document Reviewed: 11/16/2014 Elsevier Interactive Patient Education  2017 [place] Prevention in the Home Falls can cause injuries. They can happen to people of all ages. There are many things you can do to make your home safe and to help prevent falls. What can I do on the outside of my home?  Regularly fix the edges of walkways and driveways and fix any cracks.  Remove anything that might make you trip as you walk through a door, such as a raised step or threshold.  Trim any bushes or trees on the path to your home.  Use bright outdoor lighting.  Clear any walking paths of anything that might make someone trip, such as rocks or tools.  Regularly check to see if handrails are loose or broken. Make sure that both sides of any steps have handrails.  Any raised decks and porches should have guardrails on the edges.  Have any leaves, snow, or ice cleared regularly.  Use sand or salt on walking paths during winter.  Clean up any spills in your garage right away. This includes oil or grease spills. What can I do in the bathroom?  Use night lights.  Install grab bars by the toilet and in the tub and shower. Do not use towel bars as grab bars.  Use non-skid mats or decals in the tub or shower.  If you need to sit down in the shower, use  a plastic, non-slip stool.  Keep the floor dry. Clean up any water that spills on the floor as soon as it happens.  Remove soap buildup in the tub or shower regularly.  Attach bath mats securely with double-sided non-slip rug tape.  Do not have throw rugs and other things on the floor that can make you trip. What can I do in the bedroom?  Use night lights.  Make sure that you have a light by your bed that is easy to reach.  Do not use any sheets or blankets that are too big for your bed. They should not hang down onto the floor.  Have a firm chair that has side arms. You can use this for support while you get dressed.  Do not have throw rugs and other things on the floor that can make you trip. What can I do in the kitchen?  Clean up any spills right away.  Avoid walking on wet floors.  Keep items that you use a lot in easy-to-reach places.  If you need to reach something above you, use a strong step stool that has a grab bar.  Keep electrical cords out of the way.  Do not use floor polish or wax that makes floors slippery. If you  must use wax, use non-skid floor wax.  Do not have throw rugs and other things on the floor that can make you trip. What can I do with my stairs?  Do not leave any items on the stairs.  Make sure that there are handrails on both sides of the stairs and use them. Fix handrails that are broken or loose. Make sure that handrails are as long as the stairways.  Check any carpeting to make sure that it is firmly attached to the stairs. Fix any carpet that is loose or worn.  Avoid having throw rugs at the top or bottom of the stairs. If you do have throw rugs, attach them to the floor with carpet tape.  Make sure that you have a light switch at the top of the stairs and the bottom of the stairs. If you do not have them, ask someone to add them for you. What else can I do to help prevent falls?  Wear shoes that:  Do not have high heels.  Have  rubber bottoms.  Are comfortable and fit you well.  Are closed at the toe. Do not wear sandals.  If you use a stepladder:  Make sure that it is fully opened. Do not climb a closed stepladder.  Make sure that both sides of the stepladder are locked into place.  Ask someone to hold it for you, if possible.  Clearly mark and make sure that you can see:  Any grab bars or handrails.  First and last steps.  Where the edge of each step is.  Use tools that help you move around (mobility aids) if they are needed. These include:  Canes.  Walkers.  Scooters.  Crutches.  Turn on the lights when you go into a dark area. Replace any light bulbs as soon as they burn out.  Set up your furniture so you have a clear path. Avoid moving your furniture around.  If any of your floors are uneven, fix them.  If there are any pets around you, be aware of where they are.  Review your medicines with your doctor. Some medicines can make you feel dizzy. This can increase your chance of falling. Ask your doctor what other things that you can do to help prevent falls. This information is not intended to replace advice given to you by your health care provider. Make sure you discuss any questions you have with your health care provider. Document Released: 11/11/2008 Document Revised: 06/23/2015 Document Reviewed: 02/19/2014 Elsevier Interactive Patient Education  2017 [employer].

## 2019-08-25 NOTE — Progress Notes (Signed)
PCP notes:  Health Maintenance: No gaps noted   Abnormal Screenings: none   Patient concerns: none   Nurse concerns: none   Next PCP appt: 08/16/2020 @ 9:30 am

## 2019-08-25 NOTE — Progress Notes (Signed)
Subjective:   Rachel Carlson is a 74 y.o. female who presents for Medicare Annual (Subsequent) preventive examination.  Review of Systems: N/A     I connected with the patient today by telephone and verified that I am speaking with the correct person using two identifiers. Location patient: home Location nurse: work Persons participating in the virtual visit: patient, Marine scientist.   I discussed the limitations, risks, security and privacy concerns of performing an evaluation and management service by telephone and the availability of in person appointments. I also discussed with the patient that there may be a patient responsible charge related to this service. The patient expressed understanding and verbally consented to this telephonic visit.    Interactive audio and video telecommunications were attempted between this nurse and patient, however failed, due to patient having technical difficulties OR patient did not have access to video capability.  We continued and completed visit with audio only.     Cardiac Risk Factors include: advanced age (>27mn, >>59women);hypertension;dyslipidemia     Objective:    Today's Vitals   There is no height or weight on file to calculate BMI.  Advanced Directives 08/25/2019 01/01/2018 10/20/2015  Does Patient Have a Medical Advance Directive? Yes Yes No  Type of AParamedicof AMartin's AdditionsLiving will - -  Copy of [place]? No - copy requested - -  Would patient like information on creating a medical advance directive? - - No - patient declined information    Current Medications (verified) Outpatient Encounter Medications as of 08/25/2019  Medication Sig  . aspirin 81 MG tablet Take 81 mg by mouth daily.    . calcium citrate-vitamin D (CITRACAL+D) 315-200 MG-UNIT per tablet Take 1 tablet by mouth 2 (two) times daily.   . Cholecalciferol (VITAMIN D-3) 5000 UNITS TABS 3,000 Units daily.   .  Coenzyme Q10 (CO Q 10 PO) Take 400 Units by mouth daily.    . COLOCORT 100 MG/60ML enema PLACE ONE ENEMA RECTALLY EVERY DAY AT BEDTIME  . Cranberry 300 MG tablet Take 300 mg by mouth daily.    . Glucosamine-Chondroitin (GLUCOSAMINE CHONDR COMPLEX PO) Take 2 capsules by mouth daily.   . Grape Seed Extract 100 MG CAPS Take 1 capsule by mouth daily.   . lansoprazole (PREVACID) 15 MG capsule Take 1 capsule (15 mg total) by mouth 2 (two) times daily before a meal.  . LIALDA 1.2 g EC tablet Take 2 tablets by mouth twice daily.  . milk thistle 175 MG tablet Take 175 mg by mouth daily.  . Omega-3 Fatty Acids (FISH OIL TRIPLE STRENGTH) 1400 MG CAPS Take by mouth daily.    . rosuvastatin (CRESTOR) 20 MG tablet Take 1 tablet (20 mg total) by mouth at bedtime.  . vitamin B-12 (CYANOCOBALAMIN) 1000 MCG tablet Take 1,000 mcg by mouth daily.   No facility-administered encounter medications on file as of 08/25/2019.    Allergies (verified) Pantoprazole sodium, Ranitidine, Omeprazole, and Sulfasalazine   History: Past Medical History:  Diagnosis Date  . Blood transfusion without reported diagnosis   . Chronic right ear pain    normal MRI  . Esophageal stricture   . Family history of breast cancer   . Family history of colon cancer   . Family history of melanoma   . Family history of prostate cancer   . Fibroids    uterine, history of  . GERD (gastroesophageal reflux disease)   . History of uterine prolapse   .  Hx of adenomatous polyp of colon 10/19/2014  . Hyperlipidemia   . Obesity   . PVC (premature ventricular contraction)    first dx by holter in 1980's, echo (6/10) EF 75-10%, normal diastolic fxn, normal size RV and fxn, mild MR, PASP 16mHg  . Tubal pregnancy   . 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 Urge urinary incontinence 06/03/2007   Qualifier: Diagnosis of  By: BMarcelino ScotCMA, AAuburn Bilberry    Past Surgical History:  Procedure Laterality Date  . BLADDER NECK SUSPENSION  1998  .  BREAST BIOPSY Left   . COLONOSCOPY  05/18/2011   Dr. CSilvano Rusk . ECTOPIC PREGNANCY SURGERY  1980  . ESOPHAGOGASTRODUODENOSCOPY (EGD) WITH ESOPHAGEAL DILATION  04/03/12   Dr. CSilvano Rusk . PARTIAL HYSTERECTOMY     abdominal, fibroids, and prolapse (1998) bladder tack  . UPPER GASTROINTESTINAL ENDOSCOPY     Family History  Problem Relation Age of Onset  . Sudden death Father 470      "coronary arteriosclerosis" on death certificate  . Heart attack Mother 726 . Transient ischemic attack Mother   . Diabetes type II Mother   . Sudden death Mother        died age 72 . Diabetes Mother   . Coronary artery disease Mother   . Skin cancer Mother   . Arrhythmia Brother   . Colon cancer Brother 350 . Breast cancer Sister        dx in her 627s . Colon polyps Sister        adenomatous  . Colon cancer Son 376 . Melanoma Maternal Uncle   . Congestive Heart Failure Maternal Grandmother   . Prostate cancer Maternal Grandfather        dx in 769s . Tuberculosis Paternal Grandfather   . Skin cancer Maternal Aunt   . Other Son        parotid gland tumor  . Ehlers-Danlos syndrome Daughter   . Colon cancer Cousin        mid 538s maternal cousin  . Colon cancer Other        MGMs brother   Social History   Socioeconomic History  . Marital status: Married    Spouse name: Not on file  . Number of children: 3  . Years of education: Not on file  . Highest education level: Not on file  Occupational History  . Occupation: vAnimal nutritionist   Comment:      Employer: unemployed  Tobacco Use  . Smoking status: Never Smoker  . Smokeless tobacco: Never Used  Vaping Use  . Vaping Use: Never used  Substance and Sexual Activity  . Alcohol use: No    Alcohol/week: 0.0 standard drinks    Comment: rarely beer  . Drug use: No  . Sexual activity: Not on file  Other Topics Concern  . 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 Difficulty of Paying Living Expenses: Not hard at all  Food Insecurity: No Food Insecurity  . Worried About RCharity fundraiserin the Last Year: Never true  . Ran Out of Food in the Last Year: Never true  Transportation Needs: No Transportation Needs  . Lack of Transportation (Medical): No  . Lack of Transportation (Non-Medical): No  Physical Activity: Inactive  .  Days of Exercise per Week: 0 days  . Minutes of Exercise per Session: 0 min  Stress: No Stress Concern Present  . Feeling of Stress : Not at all  Social Connections:   . Frequency of Communication with Friends and Family:   . Frequency of Social Gatherings with Friends and Family:   . Attends Religious Services:   . Active Member of Clubs or Organizations:   . Attends Archivist Meetings:   Marland Kitchen Marital Status:     Tobacco Counseling Counseling given: Not Answered   Clinical Intake:  Pre-visit preparation completed: Yes  Pain : No/denies pain     Nutritional Risks: None Diabetes: No  How often do you need to have someone help you when you read instructions, pamphlets, or other written materials from your doctor or pharmacy?: 1 - Never What is the last grade level you completed in school?: post graduate  Diabetic: no Nutrition Risk Assessment:  Has the patient had any N/V/D within the last 2 months?  No  Does the patient have any non-healing wounds?  No  Has the patient had any unintentional weight loss or weight gain?  No   Diabetes:  Is the patient diabetic?  No  If diabetic, was a CBG obtained today?  No  Did the patient bring in their glucometer from home?  No  How often do you monitor your CBG's? N/A.   Financial Strains and Diabetes Management:  Are you having any financial strains with the device, your supplies or your medication? N/A.  Does the patient want to be seen by Chronic Care Management for management of their  diabetes?  N/A Would the patient like to be referred to a Nutritionist or for Diabetic Management?  N/A     Interpreter Needed?: No  Information entered by :: CJohnson, LPN   Activities of Daily Living In your present state of health, do you have any difficulty performing the following activities: 08/25/2019  Hearing? N  Vision? N  Difficulty concentrating or making decisions? N  Walking or climbing stairs? N  Dressing or bathing? N  Doing errands, shopping? N  Preparing Food and eating ? N  Using the Toilet? N  In the past six months, have you accidently leaked urine? Y  Comment stress incontinence, wears pads  Do you have problems with loss of bowel control? N  Managing your Medications? N  Managing your Finances? N  Housekeeping or managing your Housekeeping? N  Some recent data might be hidden    Patient Care Team: Tower, Wynelle Fanny, MD as PCP - General  Indicate any recent Medical Services you may have received from other than [facility] providers in the past year (date may be approximate).     Assessment:   This is a routine wellness examination for Rachel Carlson.  Hearing/Vision screen  Hearing Screening   125Hz  250Hz  500Hz  1000Hz  2000Hz  3000Hz  4000Hz  6000Hz  8000Hz   Right ear:           Left ear:           Vision Screening Comments: Patient gets annual eye exam   Dietary issues and exercise activities discussed: Current Exercise Habits: The patient does not participate in regular exercise at present, Exercise limited by: None identified  Goals    . other     Starting 10/20/2015, I will continue to take medication as prescribed in an effort to remain healthy.     . Patient Stated     08/25/2019, I will maintain  and continue medications as prescribed.       Depression Screen PHQ 2/9 Scores 08/25/2019 08/07/2018 07/15/2017 10/20/2015 07/20/2015 07/12/2014 07/08/2013  PHQ - 2 Score 0 0 0 0 0 0 0  PHQ- 9 Score 0 - - - - - -    Fall Risk Fall Risk  08/25/2019 08/07/2018 08/30/2017  07/15/2017 07/15/2017  Falls in the past year? 1 0 No No No  Comment tripped in mud puddle - Emmi Telephone Survey: data to providers prior to load - -  Number falls in past yr: 0 - - - -  Injury with Fall? 0 - - - -  Risk for fall due to : No Fall Risks - - - -  Follow up Falls evaluation completed;Falls prevention discussed - - - -    Any stairs in or around the home? Yes  If so, are there any without handrails? No  Home free of loose throw rugs in walkways, pet beds, electrical cords, etc? Yes  Adequate lighting in your home to reduce risk of falls? Yes   ASSISTIVE DEVICES UTILIZED TO PREVENT FALLS:  Life alert? No  Use of a cane, walker or w/c? No  Grab bars in the bathroom? No  Shower chair or bench in shower? No  Elevated toilet seat or a handicapped toilet? No   TIMED UP AND GO:  Was the test performed? N/A, telephonic visit .    Cognitive Function: MMSE - Mini Mental State Exam 08/25/2019 10/20/2015  Orientation to time 5 5  Orientation to Place 5 5  Registration 3 3  Attention/ Calculation 5 0  Recall 3 3  Language- name 2 objects - 0  Language- repeat 1 1  Language- follow 3 step command - 3  Language- read & follow direction - 0  Write a sentence - 0  Copy design - 0  Total score - 20  Mini Cog  Mini-Cog screen was completed. Maximum score is 22. A value of 0 denotes this part of the MMSE was not completed or the patient failed this part of the Mini-Cog screening.       Immunizations Immunization History  Administered Date(s) Administered  . Influenza Split 12/04/2010, 10/31/2015  . Influenza Whole 12/29/2008, 11/22/2009  . Influenza, High Dose Seasonal PF 11/20/2018  . Influenza,inj,Quad PF,6+ Mos 12/06/2017  . Influenza-Unspecified 11/29/2012, 01/04/2016, 10/30/2016  . Moderna SARS-COVID-2 Vaccination 04/02/2019, 05/05/2019  . Pneumococcal Conjugate-13 07/20/2015  . Pneumococcal-Unspecified 01/30/2013  . Td 12/29/2005  . Tdap 10/31/2015  . Zoster  06/10/2009  . Zoster Recombinat (Shingrix) 01/11/2017, 04/11/2017    TDAP status: Up to date Flu Vaccine status: Up to date Pneumococcal vaccine status: Up to date Covid-19 vaccine status: Completed vaccines  Qualifies for Shingles Vaccine? Yes   Zostavax completed Yes   Shingrix Completed?: Yes  Screening Tests Health Maintenance  Topic Date Due  . INFLUENZA VACCINE  08/30/2019  . COLONOSCOPY  10/12/2019  . MAMMOGRAM  07/19/2020  . TETANUS/TDAP  10/30/2025  . DEXA SCAN  Completed  . COVID-19 Vaccine  Completed  . Hepatitis C Screening  Completed  . PNA vac Low Risk Adult  Completed    Health Maintenance  There are no preventive care reminders to display for this patient.  Colorectal cancer screening: Completed 10/12/2014. Repeat every 5 years Mammogram status: Completed 07/20/2019. Repeat every year Bone Density status: Completed 07/20/2019. Results reflect: Bone density results: OSTEOPENIA. Repeat every 2 years.  Lung Cancer Screening: (Low Dose CT Chest recommended if Age 40-80  years, 30 pack-year currently smoking OR have   Additional Screening:  Hepatitis C Screening: does qualify; Completed 07/12/2014  Vision Screening: Recommended annual ophthalmology exams for early detection of glaucoma and other disorders of the eye. Is the patient up to date with their annual eye exam?  Yes  Who is the provider or what is the name of the office in which the patient attends annual eye exams? In process of finding new eye doctor because her current one has retired. If pt is not established with a provider, would they like to be referred to a provider to establish care? No .   Dental Screening: Recommended annual dental exams for proper oral hygiene  Community Resource Referral / Chronic Care Management: CRR required this visit?  No   CCM required this visit?  No      Plan:     I have personally reviewed and noted the following in the patient's chart:   . Medical and  social history . Use of alcohol, tobacco or illicit drugs  . Current medications and supplements . Functional ability and status . Nutritional status . Physical activity . Advanced directives . List of other physicians . Hospitalizations, surgeries, and ER visits in previous 12 months . Vitals . Screenings to include cognitive, depression, and falls . Referrals and appointments  In addition, I have reviewed and discussed with patient certain preventive protocols, quality metrics, and best practice recommendations. A written personalized care plan for preventive services as well as general preventive health recommendations were provided to patient.   Due to this being a telephonic visit, the after visit summary with patients personalized plan was offered to patient via mail or my-chart. Patient preferred to pick up at office at next visit.   Andrez Grime, LPN   6/[id number]/6301

## 2019-10-13 DIAGNOSIS — H524 Presbyopia: Secondary | ICD-10-CM | POA: Diagnosis not present

## 2019-11-11 ENCOUNTER — Encounter: Payer: Self-pay | Admitting: Family Medicine

## 2019-11-11 DIAGNOSIS — H35363 Drusen (degenerative) of macula, bilateral: Secondary | ICD-10-CM | POA: Diagnosis not present

## 2019-11-11 DIAGNOSIS — H25013 Cortical age-related cataract, bilateral: Secondary | ICD-10-CM | POA: Diagnosis not present

## 2019-11-11 DIAGNOSIS — H35371 Puckering of macula, right eye: Secondary | ICD-10-CM | POA: Diagnosis not present

## 2019-11-11 DIAGNOSIS — H2513 Age-related nuclear cataract, bilateral: Secondary | ICD-10-CM | POA: Diagnosis not present

## 2019-11-16 DIAGNOSIS — M25562 Pain in left knee: Secondary | ICD-10-CM | POA: Diagnosis not present

## 2019-12-11 DIAGNOSIS — M170 Bilateral primary osteoarthritis of knee: Secondary | ICD-10-CM | POA: Diagnosis not present

## 2019-12-11 DIAGNOSIS — M1712 Unilateral primary osteoarthritis, left knee: Secondary | ICD-10-CM | POA: Diagnosis not present

## 2019-12-18 ENCOUNTER — Ambulatory Visit (AMBULATORY_SURGERY_CENTER): Payer: Self-pay | Admitting: *Deleted

## 2019-12-18 ENCOUNTER — Encounter: Payer: Self-pay | Admitting: Internal Medicine

## 2019-12-18 ENCOUNTER — Other Ambulatory Visit: Payer: Self-pay

## 2019-12-18 VITALS — Ht 62.0 in | Wt 200.0 lb

## 2019-12-18 DIAGNOSIS — Z86010 Personal history of colonic polyps: Secondary | ICD-10-CM

## 2019-12-18 DIAGNOSIS — K5130 Ulcerative (chronic) rectosigmoiditis without complications: Secondary | ICD-10-CM

## 2019-12-18 NOTE — Progress Notes (Signed)
Patient is here in-person for PV. Patient denies any allergies to eggs or soy. Patient denies any problems with anesthesia/sedation. Patient denies any oxygen use at home. Patient denies taking any diet/weight loss medications or blood thinners. Patient is not being treated for MRSA or C-diff. Patient is aware of our care-partner policy and [id number] safety protocol.    COVID-19 vaccines completed on 05/05/2019, per patient.

## 2020-01-01 ENCOUNTER — Other Ambulatory Visit: Payer: Self-pay

## 2020-01-01 ENCOUNTER — Ambulatory Visit (AMBULATORY_SURGERY_CENTER): Payer: Medicare Other | Admitting: Internal Medicine

## 2020-01-01 ENCOUNTER — Encounter: Payer: Self-pay | Admitting: Internal Medicine

## 2020-01-01 VITALS — BP 125/69 | HR 73 | Temp 97.0°F | Resp 13 | Ht 62.0 in | Wt 200.0 lb

## 2020-01-01 DIAGNOSIS — K5130 Ulcerative (chronic) rectosigmoiditis without complications: Secondary | ICD-10-CM

## 2020-01-01 DIAGNOSIS — D122 Benign neoplasm of ascending colon: Secondary | ICD-10-CM | POA: Diagnosis not present

## 2020-01-01 DIAGNOSIS — K6289 Other specified diseases of anus and rectum: Secondary | ICD-10-CM | POA: Diagnosis not present

## 2020-01-01 DIAGNOSIS — Z86010 Personal history of colonic polyps: Secondary | ICD-10-CM

## 2020-01-01 MED ORDER — SODIUM CHLORIDE 0.9 % IV SOLN: 500.0000 mL | Freq: Once | INTRAVENOUS | Status: DC

## 2020-01-01 NOTE — Progress Notes (Signed)
Report to PACU, RN, vss, BBS= Clear.  

## 2020-01-01 NOTE — Progress Notes (Signed)
VS by CW  I have reviewed the patient's medical history in detail and updated the computerized patient record.

## 2020-01-01 NOTE — Patient Instructions (Addendum)
.  Handout on polyps, diverticulosis given.    YOU HAD AN ENDOSCOPIC PROCEDURE TODAY AT [facility]:   Refer to the procedure report that was given to you for any specific questions about what was found during the examination.  If the procedure report does not answer your questions, please call your gastroenterologist to clarify.  If you requested that your care partner not be given the details of your procedure findings, then the procedure report has been included in a sealed envelope for you to review at your convenience later.  YOU SHOULD EXPECT: Some feelings of bloating in the abdomen. Passage of more gas than usual.  Walking can help get rid of the air that was put into your GI tract during the procedure and reduce the bloating. If you had a lower endoscopy (such as a colonoscopy or flexible sigmoidoscopy) you may notice spotting of blood in your stool or on the toilet paper. If you underwent a bowel prep for your procedure, you may not have a normal bowel movement for a few days.  Please Note:  You might notice some irritation and congestion in your nose or some drainage.  This is from the oxygen used during your procedure.  There is no need for concern and it should clear up in a day or so.  SYMPTOMS TO REPORT IMMEDIATELY:   Following lower endoscopy (colonoscopy or flexible sigmoidoscopy):  Excessive amounts of blood in the stool  Significant tenderness or worsening of abdominal pains  Swelling of the abdomen that is new, acute  Fever of 100F or higher   For urgent or emergent issues, a gastroenterologist can be reached at any hour by calling [phone]. Do not use MyChart messaging for urgent concerns.    DIET:  We do recommend a small meal at first, but then you may proceed to your regular diet.  Drink plenty of fluids but you should avoid alcoholic beverages for 24 hours.  ACTIVITY:  You should plan to take it easy for the rest of today and you should NOT  DRIVE or use heavy machinery until tomorrow (because of the sedation medicines used during the test).    FOLLOW UP: Our staff will call the number listed on your records 48-72 hours following your procedure to check on you and address any questions or concerns that you may have regarding the information given to you following your procedure. If we do not reach you, we will leave a message.  We will attempt to reach you two times.  During this call, we will ask if you have developed any symptoms of COVID 19. If you develop any symptoms (ie: fever, flu-like symptoms, shortness of breath, cough etc.) before then, please call [phone].  If you test positive for Covid 19 in the 2 weeks post procedure, please call and report this information to [place].    If any biopsies were taken you will be contacted by phone or by letter within the next 1-3 weeks.  Please call us at [phone] if you have not heard about the biopsies in 3 weeks.    SIGNATURES/CONFIDENTIALITY: You and/or your care partner have signed paperwork which will be entered into your electronic medical record.  These signatures attest to the fact that that the information above on your After Visit Summary has been reviewed and is understood.  Full responsibility of the confidentiality of this discharge information lies with you and/or your care-partner.

## 2020-01-01 NOTE — Progress Notes (Signed)
Called to room to assist during endoscopic procedure.  Patient ID and intended procedure confirmed with present staff. Received instructions for my participation in the procedure from the performing physician.  

## 2020-01-01 NOTE — Op Note (Addendum)
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 The patient's tolerance of                            previous anesthesia was also reviewed. The risks                            and benefits of the procedure and the sedation                            options and risks were discussed with the patient.                            All questions were answered, and informed consent                            was obtained. Prior Anticoagulants: The patient has                            taken no previous anticoagulant or antiplatelet                            agents. ASA Grade Assessment: II - A patient with                            mild systemic disease. After reviewing the risks  and benefits, the patient was deemed in                            satisfactory condition to undergo the procedure.                           After obtaining informed consent, the colonoscope                            was passed under direct vision. Throughout the                            procedure, the patient's blood pressure, pulse, and                            oxygen saturations were monitored continuously.  The                            Colonoscope was introduced through the anus and                            advanced to the cecum, identified by appendiceal                            orifice and ileocecal valve. The colonoscopy was                            performed without difficulty. The patient tolerated                            the procedure well. The quality of the bowel                            preparation was good. The ileocecal valve,                            appendiceal orifice, and rectum were photographed. Scope In: 11:21:20 AM Scope Out: 11:37:15 AM Scope Withdrawal Time: 0 hours 10 minutes 2 seconds  Total Procedure Duration: 0 hours 15 minutes 55 seconds  Findings:                 The digital rectal exam was normal.                           A 5 mm polyp was found in the ascending colon. The                            polyp was sessile. The polyp was removed with a                            cold snare. Resection and retrieval were complete.                           Multiple small-mouthed diverticula were found in  the sigmoid colon, descending colon, hepatic                            flexure and ascending colon.                           Mild mucosal changes characterized by erythema were                            found in the distal rectum. These changes involve                            3-4 cm from the dentate line. Biopsies were taken                            with a cold forceps for histology. The mucosa in                            the mid and proximal rectum + sigmoid appeared                            normal with colitis in remission.                           The retroflexed view of the distal rectum and anal                            verge was normal and showed no anal or rectal                            abnormalities. Complications:            No immediate complications. Estimated Blood Loss:     Estimated blood loss was  minimal. Impression:               - One 5 mm polyp in the ascending colon, removed                            with a cold snare. Resected and retrieved.                           - Diverticulosis in the sigmoid colon, in the                            descending colon, at the hepatic flexure and in the                            ascending colon.                           - Mild mucosal changes were found in the distal                            rectum. Biopsied.                           -  Retroflexion otherwise normal. Recommendation:           - Patient has a contact number available for                            emergencies. The signs and symptoms of potential                            delayed complications were discussed with the                            patient. Return to normal activities tomorrow.                            Written discharge instructions were provided to the                            patient.                           - Resume previous diet.                           - Continue present medications.                           - Await pathology results.                           - Repeat colonoscopy is recommended. The                            colonoscopy date will be determined after pathology                            results from today's exam become available for                            review. Jerene Bears, MD 01/01/2020 11:43:26 AM This report has been signed electronically.

## 2020-01-05 ENCOUNTER — Telehealth: Payer: Self-pay

## 2020-01-05 NOTE — Telephone Encounter (Signed)
  Follow up Call-  Call back number 01/01/2020  Post procedure Call Back phone  # [phone]  Permission to leave phone message Yes  Some recent data might be hidden     Patient questions:  Do you have a fever, pain , or abdominal swelling? No. Pain Score  0 *  Have you tolerated food without any problems? Yes.    Have you been able to return to your normal activities? Yes.    Do you have any questions about your discharge instructions: Diet   No. Medications  No. Follow up visit  No.  Do you have questions or concerns about your Care? No.  Actions: * If pain score is 4 or above: No action needed, pain <4.  1. Have you developed a fever since your procedure? no  2.   Have you had an respiratory symptoms (SOB or cough) since your procedure? no  3.   Have you tested positive for COVID 19 since your procedure no  4.   Have you had any family members/close contacts diagnosed with the COVID 19 since your procedure?  no     If yes to any of these questions please route to Joylene John, RN and Joella Prince, RN

## 2020-01-07 ENCOUNTER — Encounter: Payer: Self-pay | Admitting: Internal Medicine

## 2020-01-30 HISTORY — PX: CATARACT EXTRACTION: SUR2

## 2020-02-09 DIAGNOSIS — H25811 Combined forms of age-related cataract, right eye: Secondary | ICD-10-CM | POA: Diagnosis not present

## 2020-02-09 DIAGNOSIS — H2511 Age-related nuclear cataract, right eye: Secondary | ICD-10-CM | POA: Diagnosis not present

## 2020-03-07 DIAGNOSIS — H35363 Drusen (degenerative) of macula, bilateral: Secondary | ICD-10-CM | POA: Diagnosis not present

## 2020-03-17 ENCOUNTER — Encounter (INDEPENDENT_AMBULATORY_CARE_PROVIDER_SITE_OTHER): Payer: Medicare Other | Admitting: Ophthalmology

## 2020-03-17 ENCOUNTER — Other Ambulatory Visit: Payer: Self-pay

## 2020-03-17 DIAGNOSIS — H2512 Age-related nuclear cataract, left eye: Secondary | ICD-10-CM | POA: Diagnosis not present

## 2020-03-17 DIAGNOSIS — H35371 Puckering of macula, right eye: Secondary | ICD-10-CM | POA: Diagnosis not present

## 2020-03-17 DIAGNOSIS — H43813 Vitreous degeneration, bilateral: Secondary | ICD-10-CM | POA: Diagnosis not present

## 2020-04-17 NOTE — Progress Notes (Signed)
Cardiology Office Note   Date:  04/18/2020   ID:  Rachel Carlson, DOB 1945-09-15, MRN [id number]  PCP:  Abner Greenspan, MD  Cardiologist:   Minus Breeding, MD    Chief Complaint  Patient presents with  . Palpitations      History of Present Illness: Rachel Carlson is a 75 y.o. female who presents for a history of palpitations and PVCs.  She was previously seen by Dr. Aundra Dubin.  She has a family history of sudden cardiac death and CAD.  She had an echo in 2010 which was essentially unremarkable and a Holter that demonstrated PVCs.    She had dyspnea when I saw her.  She had a negative POET (Plain Old Exercise Treadmill) 2018 . Echo in 2019 and there was mild MR.     Since I last saw her she has done okay.  She is relatively sedentary.  She does some household chores. The patient denies any new symptoms such as chest discomfort, neck or arm discomfort. There has been no new shortness of breath, PND or orthopnea. There have been no reported palpitations, presyncope or syncope.    Past Medical History:  Diagnosis Date  . Blood transfusion without reported diagnosis   . Chronic right ear pain    normal MRI  . Esophageal stricture   . Family history of breast cancer   . Family history of colon cancer   . Family history of melanoma   . Family history of prostate cancer   . Fibroids    uterine, history of  . GERD (gastroesophageal reflux disease)   . History of uterine prolapse   . Hx of adenomatous polyp of colon 10/19/2014  . Hyperlipidemia   . Obesity   . PVC (premature ventricular contraction)    first dx by holter in 1980's, echo (6/10) EF 03-49%, normal diastolic fxn, normal size RV and fxn, mild MR, PASP 80mHg  . Tubal pregnancy   . [place]   . 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 BLADDER NECK SUSPENSION  1998  .  BREAST BIOPSY Left   . COLONOSCOPY  05/18/2011/10/12/14   Dr. CSilvano Rusk . ECTOPIC PREGNANCY SURGERY  1980  . ESOPHAGOGASTRODUODENOSCOPY (EGD) WITH ESOPHAGEAL DILATION  04/03/12   Dr. CSilvano Rusk . PARTIAL HYSTERECTOMY     abdominal, fibroids, and prolapse (1998) bladder tack  . UPPER GASTROINTESTINAL ENDOSCOPY       Current Outpatient Medications  Medication Sig Dispense Refill  . aspirin 81 MG tablet Take 81 mg by mouth daily.      . calcium citrate-vitamin D (CITRACAL+D) 315-200 MG-UNIT per tablet Take 1 tablet by mouth 2 (two) times daily.    . Cholecalciferol (VITAMIN D-3) 5000 UNITS TABS 3,000 Units daily.     . Coenzyme Q10 (CO Q 10 PO) Take 400 Units by mouth daily.      . COLOCORT 100 MG/60ML enema PLACE ONE ENEMA RECTALLY EVERY DAY AT BEDTIME 1680 mL 0  . Cranberry 300 MG tablet Take 300 mg by mouth daily.      . Glucosamine-Chondroitin (GLUCOSAMINE CHONDR COMPLEX PO) Take 2 capsules by mouth daily.     . Grape Seed Extract 100 MG CAPS Take 1 capsule by mouth daily.    . lansoprazole (PREVACID) 15 MG capsule Take 1 capsule (15 mg total) by  mouth 2 (two) times daily before a meal.    . LIALDA 1.2 g EC tablet Take 2 tablets by mouth twice daily. 120 tablet 11  . meloxicam (MOBIC) 7.5 MG tablet Take 7.5 mg by mouth 2 (two) times daily.    . milk thistle 175 MG tablet Take 175 mg by mouth daily.    . Omega-3 Fatty Acids (FISH OIL TRIPLE STRENGTH) 1400 MG CAPS Take by mouth daily.      . rosuvastatin (CRESTOR) 20 MG tablet Take 1 tablet (20 mg total) by mouth at bedtime. 90 tablet 3  . vitamin B-12 (CYANOCOBALAMIN) 1000 MCG tablet Take 1,000 mcg by mouth daily.     No current facility-administered medications for this visit.    Allergies:   Pantoprazole sodium, Ranitidine, Omeprazole, and Sulfasalazine  ROS:  Please see the history of present illness.   Otherwise, review of systems are positive for none.   All other systems are reviewed and negative.    PHYSICAL EXAM: VS:   BP 128/72   Pulse 67   Ht 5' 2.5" (1.588 m)   Wt 199 lb 3.2 oz (90.4 kg)   LMP 05/29/1996   SpO2 97%   BMI 35.85 kg/m  , BMI Body mass index is 35.85 kg/m.  GENERAL:  Well appearing NECK:  No jugular venous distention, waveform within normal limits, carotid upstroke brisk and symmetric, no bruits, no thyromegaly LUNGS:  Clear to auscultation bilaterally CHEST:  Unremarkable HEART:  PMI not displaced or sustained,S1 and S2 within normal limits, no S3, no S4, no clicks, no rubs, no murmurs ABD:  Flat, positive bowel sounds normal in frequency in pitch, no bruits, no rebound, no guarding, no midline pulsatile mass, no hepatomegaly, no splenomegaly EXT:  2 plus pulses throughout, mild bilateral ankle edema, no cyanosis no clubbing   EKG:  EKG is  acute ordered today. The ekg ordered today demonstrates normal sinus rhythm, rate 67, axis within normal limits, intervals within normal limits, no acute ST-T wave changes.  Premature ectopic complexes   Recent Labs: 08/07/2019: ALT 16; BUN 10; Creatinine, Ser 0.66; Hemoglobin 13.5; Platelets 182.0; Potassium 4.5; Sodium 140; TSH 2.69    Lipid Panel    Component Value Date/Time   CHOL 148 08/07/2019 0907   TRIG 131.0 08/07/2019 0907   HDL 62.60 08/07/2019 0907   CHOLHDL 2 08/07/2019 0907   VLDL 26.2 08/07/2019 0907   LDLCALC 59 08/07/2019 0907   LDLDIRECT 257.3 06/04/2008 1005     Wt Readings from Last 3 Encounters:  04/18/20 199 lb 3.2 oz (90.4 kg)  01/01/20 200 lb (90.7 kg)  12/18/19 200 lb (90.7 kg)      Other studies Reviewed: Additional studies/ records that were reviewed today include: Labs Review of the above records demonstrates:   See elsewhere   ASSESSMENT AND PLAN:  DYSPNEA:   She has no significant dyspnea.  No change in therapy.  No further testing.  She is going to start walking 1/4 mile to the mailbox and back every day.   DYSLIPIDEMIA:    Her LDL 59 with an HDL of 62.  No change in therapy.   HTN:   Her  blood pressure is controlled.  No change in therapy.   MR:  She had mild MR in 2019.  I do not appreciate a murmur and so echo is not indicated.   PVCs: He is not particularly symptomatic.  No change in therapy.   Current medicines are reviewed at length with  the patient today.  The patient does not have concerns regarding medicines.  The following changes have been made:  none  Labs/ tests ordered today include:  None  Orders Placed This Encounter  Procedures  . EKG 12-Lead     Disposition:   FU with me in 12 months.    Signed, Minus Breeding, MD  04/18/2020 11:49 AM    [facility]

## 2020-04-18 ENCOUNTER — Ambulatory Visit (INDEPENDENT_AMBULATORY_CARE_PROVIDER_SITE_OTHER): Payer: Medicare Other | Admitting: Cardiology

## 2020-04-18 ENCOUNTER — Other Ambulatory Visit: Payer: Self-pay

## 2020-04-18 ENCOUNTER — Encounter: Payer: Self-pay | Admitting: Cardiology

## 2020-04-18 VITALS — BP 128/72 | HR 67 | Ht 62.5 in | Wt 199.2 lb

## 2020-04-18 DIAGNOSIS — I10 Essential (primary) hypertension: Secondary | ICD-10-CM

## 2020-04-18 DIAGNOSIS — E785 Hyperlipidemia, unspecified: Secondary | ICD-10-CM

## 2020-04-18 DIAGNOSIS — R0602 Shortness of breath: Secondary | ICD-10-CM

## 2020-04-18 NOTE — Patient Instructions (Signed)
Medication Instructions:  Your physician recommends that you continue on your current medications as directed. Please refer to the Current Medication list given to you today.  *If you need a refill on your cardiac medications before your next appointment, please call your pharmacy*  Follow-Up: At [facility], you and your health needs are our priority.  As part of our continuing mission to provide you with exceptional heart care, we have created designated Provider Care Teams.  These Care Teams include your primary Cardiologist (physician) and Advanced Practice Providers (APPs -  Physician Assistants and Nurse Practitioners) who all work together to provide you with the care you need, when you need it.  We recommend signing up for the patient portal called "MyChart".  Sign up information is provided on this After Visit Summary.  MyChart is used to connect with patients for Virtual Visits (Telemedicine).  Patients are able to view lab/test results, encounter notes, upcoming appointments, etc.  Non-urgent messages can be sent to your provider as well.   To learn more about what you can do with MyChart, go to [email or web address].    Your next appointment:   12 month(s)  The format for your next appointment:   In Person  Provider:   Minus Breeding, MD

## 2020-05-16 ENCOUNTER — Encounter: Payer: Self-pay | Admitting: Internal Medicine

## 2020-05-16 ENCOUNTER — Ambulatory Visit: Payer: Medicare Other | Admitting: Internal Medicine

## 2020-05-16 VITALS — BP 130/88 | HR 76 | Ht 62.0 in | Wt 198.5 lb

## 2020-05-16 DIAGNOSIS — Z86010 Personal history of colonic polyps: Secondary | ICD-10-CM | POA: Diagnosis not present

## 2020-05-16 DIAGNOSIS — Z800 Family history of malignant neoplasm of digestive organs: Secondary | ICD-10-CM | POA: Diagnosis not present

## 2020-05-16 DIAGNOSIS — R152 Fecal urgency: Secondary | ICD-10-CM

## 2020-05-16 DIAGNOSIS — K219 Gastro-esophageal reflux disease without esophagitis: Secondary | ICD-10-CM | POA: Diagnosis not present

## 2020-05-16 DIAGNOSIS — K582 Mixed irritable bowel syndrome: Secondary | ICD-10-CM

## 2020-05-16 DIAGNOSIS — N3941 Urge incontinence: Secondary | ICD-10-CM

## 2020-05-16 DIAGNOSIS — K5130 Ulcerative (chronic) rectosigmoiditis without complications: Secondary | ICD-10-CM | POA: Diagnosis not present

## 2020-05-16 DIAGNOSIS — N949 Unspecified condition associated with female genital organs and menstrual cycle: Secondary | ICD-10-CM

## 2020-05-16 MED ORDER — HYDROCORTISONE (PERIANAL) 2.5 % EX CREA: 1.0000 "application " | TOPICAL_CREAM | Freq: Two times a day (BID) | CUTANEOUS | 1 refills | Status: DC | PRN

## 2020-05-16 MED ORDER — LIALDA 1.2 G PO TBEC
DELAYED_RELEASE_TABLET | ORAL | 11 refills | Status: DC
Start: 2020-05-16 — End: 2021-06-05

## 2020-05-16 NOTE — Progress Notes (Addendum)
Rachel Carlson 75 y.o. 12/21/1945 [id number]  Assessment & Plan:   Encounter Diagnoses  Name Primary?  . 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 Fecal urgency   . Urge urinary incontinence   . Irritable bowel syndrome with both constipation and diarrhea   . Hx of adenomatous polyp of colon   . Family history of colon cancer   . Vaginal burning   . Gastroesophageal reflux disease, unspecified whether esophagitis present     Continue current therapy for her ulcerative rectosigmoiditis.  Referral to urogynecology regarding fecal urgency urinary incontinence vaginal burning question pelvic floor prolapse issues.  Question atrophic vaginitis.   Benefiber supplementation.  Repeat colonoscopy every 5 years not and 7.  She has a family history of colon cancer in her brother.  Use daily PPI and add a second dose as needed.  Return routinely in about 6 months sooner if needed.  I appreciate the opportunity to care for this patient. 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  She is complaining of some mild cramps in the right and left lower quadrant at times, some episodic multiple urgent stools, but normally formed.  She also has a question about her colonoscopy, it was performed by one of my colleagues in my absence due to injury, and she had a 7-year recall but that does not take into account family history of colon cancer in her brother and she is requesting every 5 years.  She had some erythema in the distal colon and rectum but biopsies did not show active IBD.  In addition to the fecal urgency she has urge urinary incontinence, she has seen [facility]cology [facility] in the past a long time ago I think, she also complains of burning in the vaginal area.  There  is a remote history of bladder neck suspension.  And partial hysterectomy with history of prolapse. Allergies  Allergen Reactions  . Pantoprazole Sodium     Abdominal pain, diarrhea  . Ranitidine     Diarrhea, abdominal pain  . Omeprazole Diarrhea    Abdominal pain also   . Sulfasalazine Rash    Fever, chills, headache, muscle pain   Current Meds  Medication Sig  . aspirin 81 MG tablet Take 81 mg by mouth daily.    . calcium citrate-vitamin D (CITRACAL+D) 315-200 MG-UNIT per tablet Take 1 tablet by mouth 2 (two) times daily.  . Cholecalciferol (VITAMIN D-3) 5000 UNITS TABS 3,000 Units daily.   . Coenzyme Q10 (CO Q 10 PO) Take 400 Units by mouth daily.    . Cranberry 300 MG tablet Take 300 mg by mouth daily.    . Glucosamine-Chondroitin (GLUCOSAMINE CHONDR COMPLEX PO) Take 2 capsules by mouth daily.   . Grape Seed Extract 100 MG CAPS Take 1 capsule by mouth daily.  . hydrocortisone (ANUSOL-HC) 2.5 % rectal cream Place 1 application rectally 2 (two) times daily as needed for hemorrhoids or anal itching.  . lansoprazole (PREVACID) 15 MG capsule Take 1 capsule (15 mg total) by mouth 2 (two) times daily before a meal.  . meloxicam (MOBIC) 7.5 MG tablet Take 7.5 mg by mouth 2 (two) times daily.  . milk thistle 175 MG tablet Take 175 mg by mouth daily.  . Omega-3 Fatty Acids (FISH OIL TRIPLE STRENGTH) 1400 MG CAPS Take by mouth  daily.    . rosuvastatin (CRESTOR) 20 MG tablet Take 1 tablet (20 mg total) by mouth at bedtime.  . vitamin B-12 (CYANOCOBALAMIN) 1000 MCG tablet Take 1,000 mcg by mouth daily.  . [DISCONTINUED] COLOCORT 100 MG/60ML enema PLACE ONE ENEMA RECTALLY EVERY DAY AT BEDTIME  . [DISCONTINUED] LIALDA 1.2 g EC tablet Take 2 tablets by mouth twice daily.   Past Medical History:  Diagnosis Date  . Blood transfusion without reported diagnosis   . Chronic right ear pain    normal MRI  . Esophageal stricture   . Family history of breast cancer   . Family history of colon  cancer   . Family history of melanoma   . Family history of prostate cancer   . Fibroids    uterine, history of  . GERD (gastroesophageal reflux disease)   . History of uterine prolapse   . Hx of adenomatous polyp of colon 10/19/2014  . Hyperlipidemia   . Obesity   . PVC (premature ventricular contraction)    first dx by holter in 1980's, echo (6/10) EF 49-20%, normal diastolic fxn, normal size RV and fxn, mild MR, PASP 1mHg  . Tubal pregnancy   . Ulcerative proctosigmoiditis ([place]   . Urge urinary incontinence 06/03/2007   Qualifier: Diagnosis of  By: BMarcelino ScotCMA, AAuburn Bilberry    Past Surgical History:  Procedure Laterality Date  . BLADDER NECK SUSPENSION  1998  . BREAST BIOPSY Left   . CATARACT EXTRACTION Right 01/2020  . COLONOSCOPY  05/18/2011/10/12/14   Dr. CSilvano Rusk . ECTOPIC PREGNANCY SURGERY  1980  . ESOPHAGOGASTRODUODENOSCOPY (EGD) WITH ESOPHAGEAL DILATION  04/03/12   Dr. CSilvano Rusk . PARTIAL HYSTERECTOMY     abdominal, fibroids, and prolapse (1998) bladder tack  . 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)   Grown children   Never smoker no alcohol tobacco or drug use   family history includes Arrhythmia in her brother; Barrett's esophagus in her maternal aunt and mother; Breast cancer in her sister; Colon cancer in her cousin and another family member; Colon cancer (age of onset: 337 in her son; Colon cancer (age of onset: 369 in her brother; Colon polyps in her sister; Congestive Heart Failure in her maternal grandmother; Coronary artery disease in her mother; Diabetes in her mother; Diabetes type II in her mother; Ehlers-Danlos syndrome in her daughter; Heart attack (age of onset: 772 in her mother; Melanoma in her maternal uncle; Other in her son; Prostate cancer in her maternal grandfather; Skin cancer in her maternal aunt and mother; Sudden death in her  mother; Sudden death (age of onset: 448 in her father; Transient ischemic attack in her mother; Tuberculosis in her paternal grandfather.   Review of Systems As per HPI  Objective:   Physical Exam BP 130/88 (BP Location: Left Arm, Patient Position: Sitting, Cuff Size: Normal)   Pulse 76 Comment: irregular  Ht 5' 2"  (1.575 m) Comment: height measured without shoes  Wt 198 lb 8 oz (90 kg)   LMP 05/29/1996   BMI 36.31 kg/m   Well-developed elderly white woman no acute distress, the abdomen is soft nontender obese

## 2020-05-16 NOTE — Patient Instructions (Addendum)
Take 1 tablespoon of benefiber  daily   We will refer you to [facility] and they will contact you with that appointment   Follow up in  6 months  Due to recent changes in healthcare laws, you may see the results of your imaging and laboratory studies on MyChart before your provider has had a chance to review them.  We understand that in some cases there may be results that are confusing or concerning to you. Not all laboratory results come back in the same time frame and the provider may be waiting for multiple results in order to interpret others.  Please give [place] 48 hours in order for your provider to thoroughly review all the results before contacting the office for clarification of your results.   I appreciate the  opportunity to care for you  Thank You   Ronney Lion

## 2020-05-18 ENCOUNTER — Other Ambulatory Visit: Payer: Self-pay | Admitting: *Deleted

## 2020-05-18 DIAGNOSIS — N3941 Urge incontinence: Secondary | ICD-10-CM

## 2020-05-18 DIAGNOSIS — R152 Fecal urgency: Secondary | ICD-10-CM

## 2020-05-18 NOTE — Progress Notes (Signed)
Referral to Sherlene Shams [phone] for Fecal Urgency and Urinary Incon

## 2020-05-19 ENCOUNTER — Encounter: Payer: Self-pay | Admitting: Internal Medicine

## 2020-05-19 NOTE — Assessment & Plan Note (Signed)
Meds ordered this encounter  Medications  . LIALDA 1.2 g EC tablet    Sig: Take 2 tablets by mouth twice daily.    Dispense:  120 tablet    Refill:  11  . hydrocortisone (ANUSOL-HC) 2.5 % rectal cream    Sig: Place 1 application rectally 2 (two) times daily as needed for hemorrhoids or anal itching.    Dispense:  30 g    Refill:  1

## 2020-05-22 ENCOUNTER — Encounter: Payer: Self-pay | Admitting: Internal Medicine

## 2020-06-09 ENCOUNTER — Other Ambulatory Visit: Payer: Self-pay | Admitting: Family Medicine

## 2020-06-09 DIAGNOSIS — Z1231 Encounter for screening mammogram for malignant neoplasm of breast: Secondary | ICD-10-CM

## 2020-06-16 DIAGNOSIS — H25812 Combined forms of age-related cataract, left eye: Secondary | ICD-10-CM | POA: Diagnosis not present

## 2020-06-16 DIAGNOSIS — H35373 Puckering of macula, bilateral: Secondary | ICD-10-CM | POA: Diagnosis not present

## 2020-06-16 DIAGNOSIS — H26491 Other secondary cataract, right eye: Secondary | ICD-10-CM | POA: Diagnosis not present

## 2020-08-02 NOTE — H&P (Signed)
Surgical History & Physical  Patient Name: Rachel Carlson DOB: 1945-07-06  Surgery: Cataract extraction with intraocular lens implant phacoemulsification; Left Eye  Surgeon: Baruch Goldmann MD Surgery Date:  08/12/2020 Pre-Op Date:  07/29/2020  HPI: A 71 Yr. old female patient came by herself for cataract eval in left eye, needs a second opinion, had cataract sx in right eye 02/09/2020. 1. The patient complains of difficulty when seeing street signs, which began many years ago. The left eye is affected. The episode is gradual. The condition's severity increased since last visit. Symptoms occur when the patient is driving, inside and outside. The complaint is associated with glare. 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 c/o glare, floaters and film in right eye since cataract sx. HPI was performed by Baruch Goldmann .  Medical History: Cataracts  Review of Systems Negative Allergic/Immunologic Negative Cardiovascular Negative Constitutional Negative Ear, Nose, Mouth & Throat Negative Endocrine Negative Eyes Negative Gastrointestinal Negative Genitourinary Negative Hemotologic/Lymphatic Negative Integumentary Negative Musculoskeletal Negative Neurological Negative Psychiatry Negative Respiratory  Social   Never smoked   Medication  None  Sx/Procedures Phaco c IOL,   Drug Allergies   NKDA  History & Physical: Heent:  Cataract, left eye NECK: supple without bruits LUNGS: lungs clear to auscultation CV: regular rate and rhythm Abdomen: soft and non-tender  Impression & Plan: Assessment: 1.  COMBINED FORMS AGE RELATED CATARACT; Left Eye (H25.812) 2.  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  ASTIGMATISM, REGULAR; Both Eyes (H52.223)  Plan: 1.  Cataract accounts for the patient's decreased vision. This visual impairment is not correctable with a tolerable change in glasses or contact lenses. Cataract surgery with an  implantation of a new lens should significantly improve the visual and functional status of the patient. Discussed all risks, benefits, alternatives, and potential complications. Discussed the procedures and recovery. Patient desires to have surgery. A-scan ordered and performed today for intra-ocular lens calculations. The surgery will be performed in order to improve vision for driving, reading, and for eye examinations. Recommend phacoemulsification with intra-ocular lens. Recommend Dextenza for post-operative pain and inflammation. Left Eye. Dilates poorly - shugacaine by protocol. Omidira. Toric Lens.  2.  Significant - will address after phaco PCIOL.  3.  Stable. Findings, prognosis and treatment options reviewed. OCT macula today. Under the care of retina.  4.  Toric Lens OS. May peform LVC enhancement OD.

## 2020-08-05 ENCOUNTER — Ambulatory Visit
Admission: RE | Admit: 2020-08-05 | Discharge: 2020-08-05 | Disposition: A | Discharge status: Home / Self Care | Payer: Medicare Other | Source: Ambulatory Visit | Attending: Family Medicine | Admitting: Family Medicine

## 2020-08-05 ENCOUNTER — Other Ambulatory Visit: Payer: Self-pay

## 2020-08-05 DIAGNOSIS — Z1231 Encounter for screening mammogram for malignant neoplasm of breast: Secondary | ICD-10-CM | POA: Diagnosis not present

## 2020-08-08 ENCOUNTER — Encounter (HOSPITAL_COMMUNITY): Payer: Self-pay

## 2020-08-08 ENCOUNTER — Encounter (HOSPITAL_COMMUNITY)
Admission: RE | Admit: 2020-08-08 | Discharge: 2020-08-08 | Disposition: A | Discharge status: Home / Self Care | Payer: Medicare Other | Source: Ambulatory Visit | Attending: Ophthalmology | Admitting: Ophthalmology

## 2020-08-08 ENCOUNTER — Other Ambulatory Visit: Payer: Self-pay

## 2020-08-08 DIAGNOSIS — H25812 Combined forms of age-related cataract, left eye: Secondary | ICD-10-CM | POA: Diagnosis not present

## 2020-08-10 ENCOUNTER — Other Ambulatory Visit (HOSPITAL_COMMUNITY): Payer: Medicare Other

## 2020-08-11 ENCOUNTER — Other Ambulatory Visit: Payer: Self-pay

## 2020-08-11 ENCOUNTER — Other Ambulatory Visit (INDEPENDENT_AMBULATORY_CARE_PROVIDER_SITE_OTHER): Payer: Medicare Other

## 2020-08-11 ENCOUNTER — Telehealth: Payer: Self-pay | Admitting: Family Medicine

## 2020-08-11 DIAGNOSIS — E559 Vitamin D deficiency, unspecified: Secondary | ICD-10-CM

## 2020-08-11 DIAGNOSIS — I10 Essential (primary) hypertension: Secondary | ICD-10-CM

## 2020-08-11 DIAGNOSIS — R7303 Prediabetes: Secondary | ICD-10-CM

## 2020-08-11 DIAGNOSIS — E7800 Pure hypercholesterolemia, unspecified: Secondary | ICD-10-CM

## 2020-08-11 DIAGNOSIS — M810 Age-related osteoporosis without current pathological fracture: Secondary | ICD-10-CM

## 2020-08-11 LAB — COMPREHENSIVE METABOLIC PANEL
ALT: 13 U/L (ref 0–35)
AST: 14 U/L (ref 0–37)
Albumin: 4.5 g/dL (ref 3.5–5.2)
Alkaline Phosphatase: 60 U/L (ref 39–117)
BUN: 14 mg/dL (ref 6–23)
CO2: 28 mEq/L (ref 19–32)
Calcium: 9.8 mg/dL (ref 8.4–10.5)
Chloride: 106 mEq/L (ref 96–112)
Creatinine, Ser: 0.64 mg/dL (ref 0.40–1.20)
GFR: 88.15 mL/min (ref >60.00)
Glucose, Bld: 104 mg/dL — ABNORMAL HIGH (ref 70–99)
Potassium: 4.9 mEq/L (ref 3.5–5.1)
Sodium: 141 mEq/L (ref 135–145)
Total Bilirubin: 0.9 mg/dL (ref 0.2–1.2)
Total Protein: 6.9 g/dL (ref 6.0–8.3)

## 2020-08-11 LAB — LIPID PANEL
Cholesterol: 153 mg/dL (ref 0–200)
HDL: 62 mg/dL (ref >39.00)
LDL Cholesterol: 66 mg/dL (ref 0–99)
NonHDL: 91.24
Total CHOL/HDL Ratio: 2
Triglycerides: 124 mg/dL (ref 0.0–149.0)
VLDL: 24.8 mg/dL (ref 0.0–40.0)

## 2020-08-11 LAB — VITAMIN D 25 HYDROXY (VIT D DEFICIENCY, FRACTURES): VITD: 38.66 ng/mL (ref 30.00–100.00)

## 2020-08-11 LAB — CBC WITH DIFFERENTIAL/PLATELET
Basophils Absolute: 0 10*3/uL (ref 0.0–0.1)
Basophils Relative: 0.7 % (ref 0.0–3.0)
Eosinophils Absolute: 0.1 10*3/uL (ref 0.0–0.7)
Eosinophils Relative: 1.3 % (ref 0.0–5.0)
HCT: 39.4 % (ref 36.0–46.0)
Hemoglobin: 13.3 g/dL (ref 12.0–15.0)
Lymphocytes Relative: 29.4 % (ref 12.0–46.0)
Lymphs Abs: 1.4 10*3/uL (ref 0.7–4.0)
MCHC: 33.9 g/dL (ref 30.0–36.0)
MCV: 88.7 fl (ref 78.0–100.0)
Monocytes Absolute: 0.4 10*3/uL (ref 0.1–1.0)
Monocytes Relative: 9.4 % (ref 3.0–12.0)
Neutro Abs: 2.7 10*3/uL (ref 1.4–7.7)
Neutrophils Relative %: 59.2 % (ref 43.0–77.0)
Platelets: 192 10*3/uL (ref 150.0–400.0)
RBC: 4.44 Mil/uL (ref 3.87–5.11)
RDW: 13.7 % (ref 11.5–15.5)
WBC: 4.6 10*3/uL (ref 4.0–10.5)

## 2020-08-11 LAB — HEMOGLOBIN A1C: Hgb A1c MFr Bld: 6.4 % (ref 4.6–6.5)

## 2020-08-11 LAB — TSH: TSH: 2.53 u[IU]/mL (ref 0.35–5.50)

## 2020-08-11 NOTE — Telephone Encounter (Signed)
-----   Message from Cloyd Stagers, RT sent at 07/25/2020 11:35 AM EDT ----- Regarding: Lab Orders for Thursday 7.14.2022 Please place lab orders for Thursday 7.14.2022, office visit for physical on Tuesday 7.19.2022 Thank you, Dyke Maes RT(R)

## 2020-08-12 ENCOUNTER — Ambulatory Visit (HOSPITAL_COMMUNITY): Payer: Medicare Other | Admitting: Certified Registered"

## 2020-08-12 ENCOUNTER — Ambulatory Visit (HOSPITAL_COMMUNITY)
Admission: RE | Admit: 2020-08-12 | Discharge: 2020-08-12 | Disposition: A | Discharge status: Home / Self Care | Payer: Medicare Other | Source: Ambulatory Visit | Attending: Ophthalmology | Admitting: Ophthalmology

## 2020-08-12 ENCOUNTER — Encounter (HOSPITAL_COMMUNITY)
Admission: RE | Disposition: A | Discharge status: Home / Self Care | Payer: Self-pay | Source: Ambulatory Visit | Attending: Ophthalmology

## 2020-08-12 ENCOUNTER — Other Ambulatory Visit: Payer: Self-pay

## 2020-08-12 ENCOUNTER — Encounter (HOSPITAL_COMMUNITY): Payer: Self-pay | Admitting: Ophthalmology

## 2020-08-12 DIAGNOSIS — H25812 Combined forms of age-related cataract, left eye: Secondary | ICD-10-CM | POA: Insufficient documentation

## 2020-08-12 DIAGNOSIS — H52223 Regular astigmatism, bilateral: Secondary | ICD-10-CM | POA: Insufficient documentation

## 2020-08-12 DIAGNOSIS — H26491 Other secondary cataract, right eye: Secondary | ICD-10-CM | POA: Insufficient documentation

## 2020-08-12 DIAGNOSIS — H35373 Puckering of macula, bilateral: Secondary | ICD-10-CM | POA: Insufficient documentation

## 2020-08-12 DIAGNOSIS — G709 Myoneural disorder, unspecified: Secondary | ICD-10-CM | POA: Diagnosis not present

## 2020-08-12 DIAGNOSIS — H2512 Age-related nuclear cataract, left eye: Secondary | ICD-10-CM | POA: Diagnosis not present

## 2020-08-12 HISTORY — PX: CATARACT EXTRACTION W/PHACO: SHX586

## 2020-08-12 SURGERY — PHACOEMULSIFICATION, CATARACT, WITH IOL INSERTION
Anesthesia: Monitor Anesthesia Care | Site: Eye | Laterality: Left

## 2020-08-12 MED ORDER — SODIUM HYALURONATE 23MG/ML IO SOSY
PREFILLED_SYRINGE | INTRAOCULAR | Status: DC | PRN
  Administered 2020-08-12: 0.6 mL via INTRAOCULAR

## 2020-08-12 MED ORDER — LACTATED RINGERS IV SOLN: INTRAVENOUS | Status: DC

## 2020-08-12 MED ORDER — STERILE WATER FOR IRRIGATION IR SOLN
Status: DC | PRN
  Administered 2020-08-12: 250 mL

## 2020-08-12 MED ORDER — TROPICAMIDE 1 % OP SOLN
1.0000 [drp] | OPHTHALMIC | Status: AC
  Administered 2020-08-12 (×3): 1 [drp] via OPHTHALMIC

## 2020-08-12 MED ORDER — BSS IO SOLN
INTRAOCULAR | Status: DC | PRN
  Administered 2020-08-12: 15 mL via INTRAOCULAR

## 2020-08-12 MED ORDER — SODIUM HYALURONATE 10 MG/ML IO SOLUTION
PREFILLED_SYRINGE | INTRAOCULAR | Status: DC | PRN
  Administered 2020-08-12: 0.85 mL via INTRAOCULAR

## 2020-08-12 MED ORDER — EPINEPHRINE PF 1 MG/ML IJ SOLN
INTRAOCULAR | Status: DC | PRN
  Administered 2020-08-12: 500 mL

## 2020-08-12 MED ORDER — TETRACAINE HCL 0.5 % OP SOLN
1.0000 [drp] | OPHTHALMIC | Status: AC | PRN
  Administered 2020-08-12 (×3): 1 [drp] via OPHTHALMIC

## 2020-08-12 MED ORDER — POVIDONE-IODINE 5 % OP SOLN
OPHTHALMIC | Status: DC | PRN
  Administered 2020-08-12: 1 via OPHTHALMIC

## 2020-08-12 MED ORDER — LIDOCAINE HCL (PF) 1 % IJ SOLN
INTRAOCULAR | Status: DC | PRN
  Administered 2020-08-12: 1 mL via OPHTHALMIC

## 2020-08-12 MED ORDER — EPINEPHRINE PF 1 MG/ML IJ SOLN
INTRAMUSCULAR | Status: AC
  Filled 2020-08-12: qty 1

## 2020-08-12 MED ORDER — PHENYLEPHRINE HCL 2.5 % OP SOLN
1.0000 [drp] | OPHTHALMIC | Status: AC | PRN
  Administered 2020-08-12 (×3): 1 [drp] via OPHTHALMIC

## 2020-08-12 SURGICAL SUPPLY — 14 items
CLOTH BEACON ORANGE TIMEOUT ST (SAFETY) ×1 IMPLANT
EYE SHIELD UNIVERSAL CLEAR (GAUZE/BANDAGES/DRESSINGS) ×1 IMPLANT
GLOVE SURG UNDER POLY LF SZ6.5 (GLOVE) ×1 IMPLANT
GLOVE SURG UNDER POLY LF SZ7 (GLOVE) ×1 IMPLANT
NDL HYPO 18GX1.5 BLUNT FILL (NEEDLE) IMPLANT
NEEDLE HYPO 18GX1.5 BLUNT FILL (NEEDLE) ×2 IMPLANT
PAD ARMBOARD 7.5X6 YLW CONV (MISCELLANEOUS) ×1 IMPLANT
PROC W SPEC LENS (INTRAOCULAR LENS)
PROCESS W SPEC LENS (INTRAOCULAR LENS) IMPLANT
SYR TB 1ML LL NO SAFETY (SYRINGE) ×1 IMPLANT
TAPE SURG TRANSPORE 1 IN (GAUZE/BANDAGES/DRESSINGS) IMPLANT
TAPE SURGICAL TRANSPORE 1 IN (GAUZE/BANDAGES/DRESSINGS) ×2
Tecnis Eyhance Toric II IOL (Intraocular Lens) ×1 IMPLANT
WATER STERILE IRR 250ML POUR (IV SOLUTION) ×1 IMPLANT

## 2020-08-12 NOTE — Discharge Instructions (Signed)
Please discharge patient when stable, will follow up today with Dr. Farzana Koci at the [facility] office immediately following discharge.  Leave shield in place until visit.  All paperwork with discharge instructions will be given at the office.  [facility] Address:  [address]  [place], [place] [address]  

## 2020-08-12 NOTE — Anesthesia Procedure Notes (Signed)
Procedure Name: MAC Date/Time: 08/12/2020 2:24 PM Performed by: Orlie Dakin, CRNA Pre-anesthesia Checklist: Patient identified, Emergency Drugs available, Suction available and Patient being monitored Patient Re-evaluated:Patient Re-evaluated prior to induction Oxygen Delivery Method: Nasal cannula Placement Confirmation: positive ETCO2

## 2020-08-12 NOTE — Op Note (Addendum)
Date of procedure: 08/12/20  Pre-operative diagnosis: Visually significant age-related cataract, Left Eye; Visually Significant Astigmatism, Left Eye (H25.?2)  Post-operative diagnosis: Visually significant age-related cataract, Left Eye; Visually Significant Astigmatism, Left Eye  Procedure: Removal of cataract via phacoemulsification and insertion of intra-ocular lens Wynetta Emery and Johnson [id number] +23.0D into the capsular bag of the Left Eye  Attending surgeon: Gerda Diss. Prudy Candy, MD, MA  Anesthesia: MAC, Topical Akten  Complications: None  Estimated Blood Loss: <22m (minimal)  Specimens: None  Implants: As above  Indications:  Visually significant age-related cataract, Left Eye; Visually Significant Astigmatism, Left Eye  Procedure:  The patient was seen and identified in the pre-operative area. The operative eye was identified and dilated.  The operative eye was marked.  Pre-operative toric markers were used to mark the eye at 0 and 180 degrees. Topical anesthesia was administered to the operative eye.     The patient was then to the operative suite and placed in the supine position.  A timeout was performed confirming the patient, procedure to be performed, and all other relevant information.   The patient's face was prepped and draped in the usual fashion for intra-ocular surgery.  A lid speculum was placed into the operative eye and the surgical microscope moved into place and focused.  A superotemporal paracentesis was created using a 20 gauge paracentesis blade.  Shugarcaine was injected into the anterior chamber.  Viscoelastic was injected into the anterior chamber.  A temporal clear-corneal main wound incision was created using a 2.452mmicrokeratome.  A continuous curvilinear capsulorrhexis was initiated using an irrigating cystitome and completed using capsulorrhexis forceps.  Hydrodissection and hydrodeliniation were performed.  Viscoelastic was injected into the anterior chamber.  A  phacoemulsification handpiece and a chopper as a second instrument were used to remove the nucleus and epinucleus. The irrigation/aspiration handpiece was used to remove any remaining cortical material.   The capsular bag was reinflated with viscoelastic, checked, and found to be intact.  The eye was marked to the per-op meridian.  The intraocular lens was inserted into the capsular bag and dialed into place using a Kuglen hook to 26 degrees.  The irrigation/aspiration handpiece was used to remove any remaining viscoelastic.  The clear corneal wound and paracentesis wounds were then hydrated and checked with Weck-Cels to be watertight.  The lid-speculum and drape was removed, and the patient's face was cleaned with a wet and dry 4x4.  A clear shield was taped over the eye. The patient was taken to the post-operative care unit in good condition, having tolerated the procedure well.  Post-Op Instructions: [facility] and will receive all other orders and instructions.

## 2020-08-12 NOTE — Transfer of Care (Signed)
Immediate Anesthesia Transfer of Care Note  Patient: Rachel Carlson  Procedure(s) Performed: CATARACT EXTRACTION PHACO AND INTRAOCULAR LENS PLACEMENT LEFT EYE (Left: Eye)  Patient Location: Short Stay  Anesthesia Type:MAC  Level of Consciousness: awake, alert  and oriented  Airway & Oxygen Therapy: Patient Spontanous Breathing  Post-op Assessment: Report given to RN and Post -op Vital signs reviewed and stable  Post vital signs: Reviewed and stable  Last Vitals:  Vitals Value Taken Time  BP    Temp    Pulse    Resp    SpO2      Last Pain:  Vitals:   08/12/20 1320  TempSrc: Oral  PainSc: 0-No pain         Complications: No notable events documented.

## 2020-08-12 NOTE — Anesthesia Preprocedure Evaluation (Signed)
Anesthesia Evaluation  Patient identified by MRN, date of birth, ID band Patient awake    Reviewed: Allergy & Precautions, H&P , NPO status , Patient's Chart, lab work & pertinent test results, reviewed documented beta blocker date and time   Airway Mallampati: II  TM Distance: >3 FB Neck ROM: full    Dental no notable dental hx.    Pulmonary neg pulmonary ROS,    Pulmonary exam normal breath sounds clear to auscultation       Cardiovascular Exercise Tolerance: Good negative cardio ROS   Rhythm:regular Rate:Normal     Neuro/Psych  Neuromuscular disease negative psych ROS   GI/Hepatic Neg liver ROS, PUD, GERD  ,  Endo/Other  negative endocrine ROS  Renal/GU negative Renal ROS  negative genitourinary   Musculoskeletal   Abdominal   Peds  Hematology negative hematology ROS (+)   Anesthesia Other Findings   Reproductive/Obstetrics negative OB ROS                             Anesthesia Physical Anesthesia Plan  ASA: 3  Anesthesia Plan: MAC   Post-op Pain Management:    Induction:   PONV Risk Score and Plan:   Airway Management Planned:   Additional Equipment:   Intra-op Plan:   Post-operative Plan:   Informed Consent: I have reviewed the patients History and Physical, chart, labs and discussed the procedure including the risks, benefits and alternatives for the proposed anesthesia with the patient or authorized representative who has indicated his/her understanding and acceptance.     Dental Advisory Given  Plan Discussed with: CRNA  Anesthesia Plan Comments:         Anesthesia Quick Evaluation

## 2020-08-12 NOTE — Anesthesia Postprocedure Evaluation (Signed)
Anesthesia Post Note  Patient: SYRIANA CROSLIN  Procedure(s) Performed: CATARACT EXTRACTION PHACO AND INTRAOCULAR LENS PLACEMENT LEFT EYE (Left: Eye)  Patient location during evaluation: Phase II Anesthesia Type: MAC Level of consciousness: awake Pain management: pain level controlled Vital Signs Assessment: post-procedure vital signs reviewed and stable Respiratory status: spontaneous breathing and respiratory function stable Cardiovascular status: blood pressure returned to baseline and stable Postop Assessment: no headache and no apparent nausea or vomiting Anesthetic complications: no Comments: Late entry   No notable events documented.   Last Vitals:  Vitals:   08/12/20 1320 08/12/20 1444  BP: (!) 142/86 (!) 146/73  Pulse: 73 70  Resp: 16 18  Temp: 36.8 C 36.7 C  SpO2: 96% 97%    Last Pain:  Vitals:   08/12/20 1444  TempSrc: Oral  PainSc: 0-No pain                 Louann Sjogren

## 2020-08-15 ENCOUNTER — Encounter (HOSPITAL_COMMUNITY): Payer: Self-pay | Admitting: Ophthalmology

## 2020-08-16 ENCOUNTER — Ambulatory Visit (INDEPENDENT_AMBULATORY_CARE_PROVIDER_SITE_OTHER): Payer: Medicare Other | Admitting: Family Medicine

## 2020-08-16 ENCOUNTER — Other Ambulatory Visit: Payer: Self-pay

## 2020-08-16 ENCOUNTER — Encounter: Payer: Self-pay | Admitting: Family Medicine

## 2020-08-16 VITALS — BP 126/68 | HR 60 | Temp 98.0°F | Ht 61.5 in | Wt 199.2 lb

## 2020-08-16 DIAGNOSIS — I10 Essential (primary) hypertension: Secondary | ICD-10-CM

## 2020-08-16 DIAGNOSIS — R7303 Prediabetes: Secondary | ICD-10-CM

## 2020-08-16 DIAGNOSIS — E559 Vitamin D deficiency, unspecified: Secondary | ICD-10-CM

## 2020-08-16 DIAGNOSIS — Z6837 Body mass index (BMI) 37.0-37.9, adult: Secondary | ICD-10-CM

## 2020-08-16 DIAGNOSIS — K5130 Ulcerative (chronic) rectosigmoiditis without complications: Secondary | ICD-10-CM

## 2020-08-16 DIAGNOSIS — Z0000 Encounter for general adult medical examination without abnormal findings: Secondary | ICD-10-CM | POA: Diagnosis not present

## 2020-08-16 DIAGNOSIS — M810 Age-related osteoporosis without current pathological fracture: Secondary | ICD-10-CM

## 2020-08-16 DIAGNOSIS — E7800 Pure hypercholesterolemia, unspecified: Secondary | ICD-10-CM

## 2020-08-16 DIAGNOSIS — K219 Gastro-esophageal reflux disease without esophagitis: Secondary | ICD-10-CM

## 2020-08-16 MED ORDER — ROSUVASTATIN CALCIUM 20 MG PO TABS: 20.0000 mg | ORAL_TABLET | Freq: Every day | ORAL | 3 refills | Status: DC

## 2020-08-16 NOTE — Assessment & Plan Note (Signed)
Reviewed health habits including diet and exercise and skin cancer prevention Reviewed appropriate screening tests for age  Also reviewed health mt list, fam hx and immunization status , as well as social and family history   See HPI Labs reviewed  amw planned  covid immunized and declines booster-discussed  Colonoscopy utd  Mammogram utd dexa utd and no falls or fx

## 2020-08-16 NOTE — Patient Instructions (Addendum)
For diabetes prevention  Try to get most of your carbohydrates from produce (with the exception of white potatoes)  Eat less bread/pasta/rice/snack foods/cereals/sweets and other items from the middle of the grocery store (processed carbs)  Stay active  Work up to 30 minutes or more of exercise at least 5 days per week   Take care of yourself   Stay active   Continue vitamin D- level is improved

## 2020-08-16 NOTE — Assessment & Plan Note (Signed)
Taking lialda  Stable  Under GI care

## 2020-08-16 NOTE — Progress Notes (Signed)
Subjective:    Patient ID: Rachel Carlson, female    DOB: [birth year], 75 y.o.   MRN: [id number]  This visit occurred during the SARS-CoV-2 public health emergency.  Safety protocols were in place, including screening questions prior to the visit, additional usage of staff PPE, and extensive cleaning of exam room while observing appropriate contact time as indicated for disinfecting solutions.   HPI Here for health maintenance exam and to review chronic medical problems    Wt Readings from Last 3 Encounters:  08/16/20 199 lb 3 oz (90.4 kg)  08/12/20 195 lb (88.5 kg)  05/16/20 198 lb 8 oz (90 kg)   37.03 kg/m Still working the garden  Eating a lot of vegetables  Some processed foods also   Cataract surgery - did not help her vision much   Feeling ok  Nothing new   Has amw planned on 7/28  Covid immunized without booster , does not think she will get one  Had covid 11/20 (had ab infusion)  Gets flu shots annually  Tdap 10/17 Pna vaccines utd Had shingrix  Colonoscopy 12/21 with 7 y recall  H/o ulcerative colitis  Takes lialda  Brother had colon cancer at 87 and son had it at 65   Mammogram 7/22 Self breast exam - no lumps   Dexa 6/21-osteopenia Past h/o 5 y course of evista  Falls none  Fractures- none  Supplements - vit D Exercise - gardening  Tried to walk but plantar fascitis got worse  D level is 38.6- takes 5000 iu   Thinking about a total gym   HTN bp is stable today  No cp or palpitations or headaches or edema  No side effects to medicines  BP Readings from Last 3 Encounters:  08/16/20 126/68  08/12/20 (!) 146/73  05/16/20 130/88     Pulse Readings from Last 3 Encounters:  08/16/20 60  08/12/20 70  05/16/20 76    Controlled with lifestyle No medications   GERD Takes prevacid 15 mg daily   Lab Results  Component Value Date   CREATININE 0.64 08/11/2020   BUN 14 08/11/2020   NA 141 08/11/2020   K 4.9 08/11/2020   CL 106  08/11/2020   CO2 28 08/11/2020    Hyperlipidemia  Lab Results  Component Value Date   CHOL 153 08/11/2020   CHOL 148 08/07/2019   CHOL 145 08/07/2018   Lab Results  Component Value Date   HDL 62.00 08/11/2020   HDL 62.60 08/07/2019   HDL 61.00 08/07/2018   Lab Results  Component Value Date   LDLCALC 66 08/11/2020   LDLCALC 59 08/07/2019   LDLCALC 61 08/07/2018   Lab Results  Component Value Date   TRIG 124.0 08/11/2020   TRIG 131.0 08/07/2019   TRIG 111.0 08/07/2018   Lab Results  Component Value Date   CHOLHDL 2 08/11/2020   CHOLHDL 2 08/07/2019   CHOLHDL 2 08/07/2018   Lab Results  Component Value Date   LDLDIRECT 257.3 06/04/2008   LDLDIRECT 203.9 06/04/2007   Crestor 20 mg daily and diet   Prediabetes Lab Results  Component Value Date   HGBA1C 6.4 08/11/2020   Up from 6.2  Not a lot of pasta  Avoids bread  Eats cereal  Some cookies   Lab Results  Component Value Date   WBC 4.6 08/11/2020   HGB 13.3 08/11/2020   HCT 39.4 08/11/2020   MCV 88.7 08/11/2020   PLT 192.0 08/11/2020  Lab Results  Component Value Date   TSH 2.53 08/11/2020    Patient Active Problem List   Diagnosis Date Noted   Nonrheumatic mitral valve regurgitation 04/16/2019   Essential hypertension 04/11/2018   PVC's (premature ventricular contractions) 04/11/2018   Class 2 obesity with body mass index (BMI) of 37.0 to 37.9 in adult 12/06/2017   Genetic testing 09/26/2017   Family history of breast cancer    Family history of colon cancer    Family history of prostate cancer    Family history of melanoma    Family history of Lynch syndrome - son 08/04/2017   Prediabetes 07/20/2015   Estrogen deficiency 04/27/2015   GERD (gastroesophageal reflux disease) 03/23/2015   Hx of adenomatous polyp of colon 10/19/2014   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[place]) 49/70/2637   Pain in joint, pelvic region and thigh 07/24/2014   Need for hepatitis C screening  test 07/12/2014   Pelvic pain in female 07/12/2014   Vitamin D deficiency 07/03/2014   Encounter for Medicare annual wellness exam 07/08/2013   Osteoporosis 05/24/2013   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   Detrusor muscle hypertonia 11/15/2010   Adnexal pain 11/15/2010   Urge incontinence 11/15/2010   Family history of coronary artery disease 10/15/2010   Routine general medical examination at a health care facility 06/01/2010   FASCIITIS, PLANTAR 06/06/2009   Hyperlipidemia 06/03/2007   Urge urinary incontinence 06/03/2007   MIGRAINES, HX OF 06/03/2007   Past Medical History:  Diagnosis Date   Blood transfusion without reported diagnosis    Chronic right ear pain    normal MRI   Esophageal stricture    Family history of breast cancer    Family history of colon cancer    Family history of melanoma    Family history of prostate cancer    Fibroids    uterine, history of   GERD (gastroesophageal reflux disease)    History of uterine prolapse    Hx of adenomatous polyp of colon 10/19/2014   Hyperlipidemia    Obesity    PVC (premature ventricular contraction)    first dx by holter in 1980's, echo (6/10) EF 85-88%, normal diastolic fxn, normal size RV and fxn, mild MR, PASP 56mHg   Tubal pregnancy    Ulcerative proctosigmoiditis ([place]    Urge urinary incontinence 06/03/2007   Qualifier: Diagnosis of  By: BMarcelino ScotCMA, AAuburn Bilberry    Past Surgical History:  Procedure Laterality Date   BLADDER NECK SUSPENSION  1998   BREAST BIOPSY Left    CATARACT EXTRACTION Right 01/2020   CATARACT EXTRACTION W/PHACO Left 08/12/2020   Procedure: CATARACT EXTRACTION PHACO AND INTRAOCULAR LENS PLACEMENT LEFT EYE;  Surgeon: WBaruch Goldmann MD;  Location: [facility];  Service: Ophthalmology;  Laterality: Left;  left CDE=10.71   COLONOSCOPY  05/18/2011/10/12/14   Dr. CSilvano Rusk  ECTOPIC PREGNANCY SURGERY  1980   ESOPHAGOGASTRODUODENOSCOPY (EGD) WITH ESOPHAGEAL DILATION  04/03/12   Dr.  CSilvano Rusk  PARTIAL HYSTERECTOMY     abdominal, fibroids, and prolapse (1998) bladder tack   UPPER GASTROINTESTINAL ENDOSCOPY     Social History   Tobacco Use   Smoking status: Never   Smokeless tobacco: Never  Vaping Use   Vaping Use: Never used  Substance Use Topics   Alcohol use: No    Alcohol/week: 0.0 standard drinks    Comment: rarely beer   Drug use: No   Family History  Problem Relation Age of Onset  Sudden death Father 22       "coronary arteriosclerosis" on death certificate   Heart attack Mother 79   Transient ischemic attack Mother    Diabetes type II Mother    Sudden death Mother        died age 83   Diabetes Mother    Coronary artery disease Mother    Skin cancer Mother    Barrett's esophagus Mother    Arrhythmia Brother    Colon cancer Brother 40   Breast cancer Sister        dx in her 75s   Colon polyps Sister        adenomatous   Colon cancer Son 39       lynch syndrome   Melanoma Maternal Uncle    Congestive Heart Failure Maternal Grandmother    Prostate cancer Maternal Grandfather        dx in 11s   Tuberculosis Paternal Grandfather    Skin cancer Maternal Aunt    Barrett's esophagus Maternal Aunt    Other Son        parotid gland tumor   Ehlers-Danlos syndrome Daughter    Colon cancer Cousin        mid 4s; maternal cousin   Colon cancer Other        MGMs brother   Esophageal cancer Neg Hx    Stomach cancer Neg Hx    Rectal cancer Neg Hx    Allergies  Allergen Reactions   Pantoprazole Sodium Diarrhea    Abdominal pain   Ranitidine Diarrhea    abdominal pain   Omeprazole Diarrhea    Abdominal pain   Sulfasalazine Rash    Fever, chills, headache, muscle pain   Current Outpatient Medications on File Prior to Visit  Medication Sig Dispense Refill   Ascorbic Acid (VITAMIN C) 1000 MG tablet Take 1,000 mg by mouth daily.     aspirin 81 MG tablet Take 81 mg by mouth daily.       calcium citrate-vitamin D (CITRACAL+D) 315-200  MG-UNIT per tablet Take 1 tablet by mouth 2 (two) times daily.     Cholecalciferol (VITAMIN D-3) 5000 UNITS TABS Take 5,000 Units by mouth daily.     Coenzyme Q10 (CO Q-10) 200 MG CAPS Take 200 mg by mouth daily.     CRANBERRY PO Take 650 mg by mouth daily.     Glucosamine-Chondroitin (GLUCOSAMINE CHONDR COMPLEX PO) Take 2 capsules by mouth daily.      GRAPE SEED EXTRACT PO Take 1 capsule by mouth daily.     lansoprazole (PREVACID) 15 MG capsule Take 1 capsule (15 mg total) by mouth 2 (two) times daily before a meal. (Patient taking differently: Take 15 mg by mouth daily.)     LIALDA 1.2 g EC tablet Take 2 tablets by mouth twice daily. (Patient taking differently: Take 2.4 g by mouth in the morning and at bedtime.) 120 tablet 11   milk thistle 175 MG tablet Take 175 mg by mouth daily.     Omega-3 Fatty Acids (FISH OIL TRIPLE STRENGTH PO) Take 1 capsule by mouth daily.     Polyethyl Glycol-Propyl Glycol (SYSTANE OP) Place 1 drop into both eyes daily.     vitamin B-12 (CYANOCOBALAMIN) 1000 MCG tablet Take 1,000 mcg by mouth daily.     zinc gluconate 50 MG tablet Take 50 mg by mouth daily.     No current facility-administered medications on file prior to visit.    Review of  Systems  Constitutional:  Positive for fatigue. Negative for activity change, appetite change, fever and unexpected weight change.  HENT:  Negative for congestion, ear pain, rhinorrhea, sinus pressure and sore throat.   Eyes:  Negative for pain, redness and visual disturbance.  Respiratory:  Negative for cough, shortness of breath and wheezing.   Cardiovascular:  Negative for chest pain and palpitations.  Gastrointestinal:  Negative for abdominal pain, blood in stool, constipation and diarrhea.  Endocrine: Negative for polydipsia and polyuria.  Genitourinary:  Negative for dysuria, frequency and urgency.  Musculoskeletal:  Positive for arthralgias. Negative for back pain and myalgias.  Skin:  Negative for pallor and rash.   Allergic/Immunologic: Negative for environmental allergies.  Neurological:  Negative for dizziness, syncope and headaches.  Hematological:  Negative for adenopathy. Does not bruise/bleed easily.  Psychiatric/Behavioral:  Negative for decreased concentration and dysphoric mood. The patient is not nervous/anxious.       Objective:   Physical Exam Constitutional:      General: She is not in acute distress.    Appearance: Normal appearance. She is well-developed. She is obese. She is not ill-appearing or diaphoretic.  HENT:     Head: Normocephalic and atraumatic.     Right Ear: Tympanic membrane, ear canal and external ear normal.     Left Ear: Tympanic membrane, ear canal and external ear normal.     Nose: Nose normal. No congestion.     Mouth/Throat:     Mouth: Mucous membranes are moist.     Pharynx: Oropharynx is clear. No posterior oropharyngeal erythema.  Eyes:     General: No scleral icterus.    Extraocular Movements: Extraocular movements intact.     Conjunctiva/sclera: Conjunctivae normal.     Pupils: Pupils are equal, round, and reactive to light.  Neck:     Thyroid: No thyromegaly.     Vascular: No carotid bruit or JVD.  Cardiovascular:     Rate and Rhythm: Normal rate and regular rhythm.     Pulses: Normal pulses.     Heart sounds: Normal heart sounds.    No gallop.  Pulmonary:     Effort: Pulmonary effort is normal. No respiratory distress.     Breath sounds: Normal breath sounds. No wheezing.     Comments: Good air exch Chest:     Chest wall: No tenderness.  Abdominal:     General: Bowel sounds are normal. There is no distension or abdominal bruit.     Palpations: Abdomen is soft. There is no mass.     Tenderness: There is no abdominal tenderness.     Hernia: No hernia is present.  Genitourinary:    Comments: Breast exam: No mass, nodules, thickening, tenderness, bulging, retraction, inflamation, nipple discharge or skin changes noted.  No axillary or  clavicular LA.     Musculoskeletal:        General: No tenderness. Normal range of motion.     Cervical back: Normal range of motion and neck supple. No rigidity. No muscular tenderness.     Right lower leg: No edema.     Left lower leg: No edema.     Comments: No kyphosis   Lymphadenopathy:     Cervical: No cervical adenopathy.  Skin:    General: Skin is warm and dry.     Coloration: Skin is not pale.     Findings: No erythema or rash.     Comments: Solar lentigines diffusely   Neurological:     Mental Status:  She is alert. Mental status is at baseline.     Cranial Nerves: No cranial nerve deficit.     Motor: No abnormal muscle tone.     Coordination: Coordination normal.     Gait: Gait normal.     Deep Tendon Reflexes: Reflexes are normal and symmetric. Reflexes normal.  Psychiatric:        Mood and Affect: Mood normal.        Cognition and Memory: Cognition and memory normal.          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[place])    Taking lialda  Stable  Under GI care        GERD (gastroesophageal reflux disease)    Doing well with prevacid 15 mg daily  No renal issues          Musculoskeletal and Integument   Osteoporosis    dexa 6/21 5 y course of evista in the past  No falls or fx  Enc more exercise  D level is therapeutic with 5000 iu daily           Other   Hyperlipidemia    Disc goals for lipids and reasons to control them Rev last labs with pt Rev low sat fat  diet in detail LDL well controlled at 68 Plan to continue crestor 20 mg daily        Relevant Medications   rosuvastatin (CRESTOR) 20 MG tablet   Routine general medical examination at a health care facility - Primary    Reviewed health habits including diet and exercise and skin cancer prevention Reviewed appropriate screening tests for age  Also reviewed health mt list, fam hx and immunization status , as well as social and family history   See HPI Labs reviewed  amw planned  covid immunized and declines booster-discussed  Colonoscopy utd  Mammogram utd dexa utd and no falls or fx        Vitamin D deficiency    D level of 38.6  Enc to continue 5000 iu D3 daily        Prediabetes    Lab Results  Component Value Date   HGBA1C 6.4 08/11/2020  disc imp of low glycemic diet and wt loss to prevent DM2       Class 2 obesity with body mass index (BMI) of 37.0 to 37.9 in adult    Discussed how this problem influences overall health and the risks it imposes  Reviewed plan for weight loss with lower calorie diet (via better food choices and also portion control or program like weight watchers) and exercise building up to or more than 30 minutes 5 days per week including some aerobic activity

## 2020-08-16 NOTE — Assessment & Plan Note (Signed)
Lab Results  Component Value Date   HGBA1C 6.4 08/11/2020   disc imp of low glycemic diet and wt loss to prevent DM2

## 2020-08-16 NOTE — Assessment & Plan Note (Signed)
dexa 6/21 5 y course of evista in the past  No falls or fx  Enc more exercise  D level is therapeutic with 5000 iu daily

## 2020-08-16 NOTE — Assessment & Plan Note (Signed)
Doing well with prevacid 15 mg daily  No renal issues

## 2020-08-16 NOTE — Assessment & Plan Note (Signed)
D level of 38.6  Enc to continue 5000 iu D3 daily

## 2020-08-16 NOTE — Assessment & Plan Note (Signed)
Discussed how this problem influences overall health and the risks it imposes  Reviewed plan for weight loss with lower calorie diet (via better food choices and also portion control or program like weight watchers) and exercise building up to or more than 30 minutes 5 days per week including some aerobic activity    

## 2020-08-16 NOTE — Assessment & Plan Note (Signed)
Disc goals for lipids and reasons to control them Rev last labs with pt Rev low sat fat diet in detail LDL well controlled at 68 Plan to continue crestor 20 mg daily

## 2020-08-16 NOTE — Assessment & Plan Note (Signed)
bp in fair control at this time  BP Readings from Last 1 Encounters:  08/16/20 126/68   No changes needed, controlled with lifestyle change No medication currently  Most recent labs reviewed  Disc lifstyle change with low sodium diet and exercise

## 2020-08-25 ENCOUNTER — Ambulatory Visit (INDEPENDENT_AMBULATORY_CARE_PROVIDER_SITE_OTHER): Payer: Medicare Other

## 2020-08-25 DIAGNOSIS — Z0000 Encounter for general adult medical examination without abnormal findings: Secondary | ICD-10-CM | POA: Diagnosis not present

## 2020-08-25 NOTE — Progress Notes (Signed)
PCP notes:  Health Maintenance: No gaps noted    Abnormal Screenings: none   Patient concerns: none   Nurse concerns: none   Next PCP appt: none

## 2020-08-25 NOTE — Patient Instructions (Signed)
Rachel Carlson , Thank you for taking time to come for your Medicare Wellness Visit. I appreciate your ongoing commitment to your health goals. Please review the following plan we discussed and let me know if I can assist you in the future.   Screening recommendations/referrals: Colonoscopy: Up to date, completed 01/01/2020, due 12/2026 Mammogram: Up to date, completed 08/05/2020, due 07/2021 Bone Density: Up to date, completed 07/20/2019, due 06/2021 Recommended yearly ophthalmology/optometry visit for glaucoma screening and checkup Recommended yearly dental visit for hygiene and checkup  Vaccinations: Influenza vaccine: due Fall 2022 Pneumococcal vaccine: Completed series Tdap vaccine: Up to date, completed 10/31/2015, due 10/2025 Shingles vaccine: Completed series   Covid-19:completed 2 vaccines   Advanced directives: Please bring a copy of your POA (Power of Attorney) and/or Living Will to your next appointment.   Conditions/risks identified: hypertension   Next appointment: Follow up in one year for your annual wellness visit    Preventive Care 73 Years and Older, Female Preventive care refers to lifestyle choices and visits with your health care provider that can promote health and wellness. What does preventive care include? A yearly physical exam. This is also called an annual well check. Dental exams once or twice a year. Routine eye exams. Ask your health care provider how often you should have your eyes checked. Personal lifestyle choices, including: Daily care of your teeth and gums. Regular physical activity. Eating a healthy diet. Avoiding tobacco and drug use. Limiting alcohol use. Practicing safe sex. Taking low-dose aspirin every day. Taking vitamin and mineral supplements as recommended by your health care provider. What happens during an annual well check? The services and screenings done by your health care provider during your annual well check will depend on your  age, overall health, lifestyle risk factors, and family history of disease. Counseling  Your health care provider may ask you questions about your: Alcohol use. Tobacco use. Drug use. Emotional well-being. Home and relationship well-being. Sexual activity. Eating habits. History of falls. Memory and ability to understand (cognition). Work and work Statistician. Reproductive health. Screening  You may have the following tests or measurements: Height, weight, and BMI. Blood pressure. Lipid and cholesterol levels. These may be checked every 5 years, or more frequently if you are over 73 years old. Skin check. Lung cancer screening. You may have this screening every year starting at age 73 if you have a 30-pack-year history of smoking and currently smoke or have quit within the past 15 years. Fecal occult blood test (FOBT) of the stool. You may have this test every year starting at age 73. Flexible sigmoidoscopy or colonoscopy. You may have a sigmoidoscopy every 5 years or a colonoscopy every 10 years starting at age 73. Hepatitis C blood test. Hepatitis B blood test. Sexually transmitted disease (STD) testing. Diabetes screening. This is done by checking your blood sugar (glucose) after you have not eaten for a while (fasting). You may have this done every 1-3 years. Bone density scan. This is done to screen for osteoporosis. You may have this done starting at age 73. Mammogram. This may be done every 1-2 years. Talk to your health care provider about how often you should have regular mammograms. Talk with your health care provider about your test results, treatment options, and if necessary, the need for more tests. Vaccines  Your health care provider may recommend certain vaccines, such as: Influenza vaccine. This is recommended every year. Tetanus, diphtheria, and acellular pertussis (Tdap, Td) vaccine. You may need a  Td booster every 10 years. Zoster vaccine. You may need this after  age 73. Pneumococcal 13-valent conjugate (PCV13) vaccine. One dose is recommended after age 73. Pneumococcal polysaccharide (PPSV23) vaccine. One dose is recommended after age 73. Talk to your health care provider about which screenings and vaccines you need and how often you need them. This information is not intended to replace advice given to you by your health care provider. Make sure you discuss any questions you have with your health care provider. Document Released: 02/11/2015 Document Revised: 10/05/2015 Document Reviewed: 11/16/2014 Elsevier Interactive Patient Education  2017 [employer] Prevention in the Home Falls can cause injuries. They can happen to people of all ages. There are many things you can do to make your home safe and to help prevent falls. What can I do on the outside of my home? Regularly fix the edges of walkways and driveways and fix any cracks. Remove anything that might make you trip as you walk through a door, such as a raised step or threshold. Trim any bushes or trees on the path to your home. Use bright outdoor lighting. Clear any walking paths of anything that might make someone trip, such as rocks or tools. Regularly check to see if handrails are loose or broken. Make sure that both sides of any steps have handrails. Any raised decks and porches should have guardrails on the edges. Have any leaves, snow, or ice cleared regularly. Use sand or salt on walking paths during winter. Clean up any spills in your garage right away. This includes oil or grease spills. What can I do in the bathroom? Use night lights. Install grab bars by the toilet and in the tub and shower. Do not use towel bars as grab bars. Use non-skid mats or decals in the tub or shower. If you need to sit down in the shower, use a plastic, non-slip stool. Keep the floor dry. Clean up any water that spills on the floor as soon as it happens. Remove soap buildup in the tub or shower  regularly. Attach bath mats securely with double-sided non-slip rug tape. Do not have throw rugs and other things on the floor that can make you trip. What can I do in the bedroom? Use night lights. Make sure that you have a light by your bed that is easy to reach. Do not use any sheets or blankets that are too big for your bed. They should not hang down onto the floor. Have a firm chair that has side arms. You can use this for support while you get dressed. Do not have throw rugs and other things on the floor that can make you trip. What can I do in the kitchen? Clean up any spills right away. Avoid walking on wet floors. Keep items that you use a lot in easy-to-reach places. If you need to reach something above you, use a strong step stool that has a grab bar. Keep electrical cords out of the way. Do not use floor polish or wax that makes floors slippery. If you must use wax, use non-skid floor wax. Do not have throw rugs and other things on the floor that can make you trip. What can I do with my stairs? Do not leave any items on the stairs. Make sure that there are handrails on both sides of the stairs and use them. Fix handrails that are broken or loose. Make sure that handrails are as long as the stairways. Check any  carpeting to make sure that it is firmly attached to the stairs. Fix any carpet that is loose or worn. Avoid having throw rugs at the top or bottom of the stairs. If you do have throw rugs, attach them to the floor with carpet tape. Make sure that you have a light switch at the top of the stairs and the bottom of the stairs. If you do not have them, ask someone to add them for you. What else can I do to help prevent falls? Wear shoes that: Do not have high heels. Have rubber bottoms. Are comfortable and fit you well. Are closed at the toe. Do not wear sandals. If you use a stepladder: Make sure that it is fully opened. Do not climb a closed stepladder. Make sure that  both sides of the stepladder are locked into place. Ask someone to hold it for you, if possible. Clearly mark and make sure that you can see: Any grab bars or handrails. First and last steps. Where the edge of each step is. Use tools that help you move around (mobility aids) if they are needed. These include: Canes. Walkers. Scooters. Crutches. Turn on the lights when you go into a dark area. Replace any light bulbs as soon as they burn out. Set up your furniture so you have a clear path. Avoid moving your furniture around. If any of your floors are uneven, fix them. If there are any pets around you, be aware of where they are. Review your medicines with your doctor. Some medicines can make you feel dizzy. This can increase your chance of falling. Ask your doctor what other things that you can do to help prevent falls. This information is not intended to replace advice given to you by your health care provider. Make sure you discuss any questions you have with your health care provider. Document Released: 11/11/2008 Document Revised: 06/23/2015 Document Reviewed: 02/19/2014 Elsevier Interactive Patient Education  2017 [employer].

## 2020-08-25 NOTE — Progress Notes (Addendum)
Subjective:   Rachel Carlson is a 75 y.o. female who presents for Medicare Annual (Subsequent) preventive examination.  Review of Systems: N/A     I connected with the patient today by telephone and verified that I am speaking with the correct person using two identifiers. Location patient: home Location nurse: work Persons participating in the telephone visit: patient, nurse.   I discussed the limitations, risks, security and privacy concerns of performing an evaluation and management service by telephone and the availability of in person appointments. I also discussed with the patient that there may be a patient responsible charge related to this service. The patient expressed understanding and verbally consented to this telephonic visit.        Cardiac Risk Factors include: advanced age (>39mn, >>97women);hypertension     Objective:    Today's Vitals   08/25/20 0943  PainSc: 0-No pain   There is no height or weight on file to calculate BMI.  Advanced Directives 08/25/2020 08/25/2019 01/01/2018 10/20/2015  Does Patient Have a Medical Advance Directive? Yes Yes Yes No  Type of [place] - -  Copy of [place] Chart? No - copy requested No - copy requested - -  Would patient like information on creating a medical advance directive? - - - No - patient declined information    Current Medications (verified) Outpatient Encounter Medications as of 08/25/2020  Medication Sig   Ascorbic Acid (VITAMIN C) 1000 MG tablet Take 1,000 mg by mouth daily.   aspirin 81 MG tablet Take 81 mg by mouth daily.     calcium citrate-vitamin D (CITRACAL+D) 315-200 MG-UNIT per tablet Take 1 tablet by mouth 2 (two) times daily.   Cholecalciferol (VITAMIN D-3) 5000 UNITS TABS Take 5,000 Units by mouth daily.   Coenzyme Q10 (CO Q-10) 200 MG CAPS Take 200 mg by mouth daily.   CRANBERRY PO  Take 650 mg by mouth daily.   Glucosamine-Chondroitin (GLUCOSAMINE CHONDR COMPLEX PO) Take 2 capsules by mouth daily.    GRAPE SEED EXTRACT PO Take 1 capsule by mouth daily.   lansoprazole (PREVACID) 15 MG capsule Take 1 capsule (15 mg total) by mouth 2 (two) times daily before a meal. (Patient taking differently: Take 15 mg by mouth daily.)   LIALDA 1.2 g EC tablet Take 2 tablets by mouth twice daily. (Patient taking differently: Take 2.4 g by mouth in the morning and at bedtime.)   milk thistle 175 MG tablet Take 175 mg by mouth daily.   Omega-3 Fatty Acids (FISH OIL TRIPLE STRENGTH PO) Take 1 capsule by mouth daily.   Polyethyl Glycol-Propyl Glycol (SYSTANE OP) Place 1 drop into both eyes daily.   rosuvastatin (CRESTOR) 20 MG tablet Take 1 tablet (20 mg total) by mouth at bedtime.   vitamin B-12 (CYANOCOBALAMIN) 1000 MCG tablet Take 1,000 mcg by mouth daily.   zinc gluconate 50 MG tablet Take 50 mg by mouth daily.   No facility-administered encounter medications on file as of 08/25/2020.    Allergies (verified) Pantoprazole sodium, Ranitidine, Omeprazole, and Sulfasalazine   History: Past Medical History:  Diagnosis Date   Blood transfusion without reported diagnosis    Chronic right ear pain    normal MRI   Esophageal stricture    Family history of breast cancer    Family history of colon cancer    Family history of melanoma    Family history of prostate  cancer    Fibroids    uterine, history of   GERD (gastroesophageal reflux disease)    History of uterine prolapse    Hx of adenomatous polyp of colon 10/19/2014   Hyperlipidemia    Obesity    PVC (premature ventricular contraction)    first dx by holter in 1980's, echo (6/10) EF 44-81%, normal diastolic fxn, normal size RV and fxn, mild MR, PASP 36mHg   Tubal pregnancy    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 CSilvano Rusk  ECTOPIC PREGNANCY SURGERY  1980   ESOPHAGOGASTRODUODENOSCOPY (EGD) WITH ESOPHAGEAL DILATION  04/03/12   Dr. CSilvano Rusk  PARTIAL HYSTERECTOMY     abdominal, fibroids, and prolapse (1998) bladder tack   UPPER GASTROINTESTINAL ENDOSCOPY     Family History  Problem Relation Age of Onset   Sudden death Father 426      "coronary arteriosclerosis" on death certificate   Heart attack Mother 756  Transient ischemic attack Mother    Diabetes type II Mother    Sudden death Mother        died age 73  Diabetes Mother    Coronary artery disease Mother    Skin cancer Mother    Barrett's esophagus Mother    Arrhythmia Brother    Colon cancer Brother 363  Breast cancer Sister        dx in her 6105s  Colon polyps Sister        adenomatous   Colon cancer Son 37       lynch syndrome   Melanoma Maternal Uncle    Congestive Heart Failure Maternal Grandmother    Prostate cancer Maternal Grandfather        dx in 71s  Tuberculosis Paternal Grandfather    Skin cancer Maternal Aunt    Barrett's esophagus Maternal Aunt    Other Son        parotid gland tumor   Ehlers-Danlos syndrome Daughter    Colon cancer Cousin        mid 543s maternal cousin   Colon cancer Other        MGMs brother   Esophageal cancer Neg Hx    Stomach cancer Neg Hx    Rectal cancer Neg Hx    Social History   Socioeconomic History   Marital status: Married    Spouse name: Not on file   Number of children: 3   Years of education: Not on file   Highest education level: Not on file  Occupational History    Occupation: vAnimal nutritionist   Comment:      Employer: unemployed  Tobacco Use   Smoking status: Never   Smokeless tobacco: Never  Vaping Use   Vaping Use: Never used  Substance and Sexual Activity   Alcohol use: No    Alcohol/week: 0.0 standard drinks    Comment: rarely beer   Drug use: No   Sexual activity: Not  on file  Other Topics Concern   Not on file  Social History Narrative   Retired Animal nutritionist, 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)   Grown children   Never smoker no alcohol tobacco or drug use   Social Determinants of Radio broadcast assistant Strain: Low Risk    Difficulty of Paying Living Expenses: Not hard at all  Food Insecurity: No Food Insecurity   Worried About Charity fundraiser in the Last Year: Never true   Arboriculturist in the Last Year: Never true  Transportation Needs: No Transportation Needs   Lack of Transportation (Medical): No   Lack of Transportation (Non-Medical): No  Physical Activity: Inactive   Days of Exercise per Week: 0 days   Minutes of Exercise per Session: 0 min  Stress: No Stress Concern Present   Feeling of Stress : Not at all  Social Connections: Not on file    Tobacco Counseling Counseling given: Not Answered   Clinical Intake:  Pre-visit preparation completed: Yes  Pain : No/denies pain Pain Score: 0-No pain     Nutritional Risks: None Diabetes: No  How often do you need to have someone help you when you read instructions, pamphlets, or other written materials from your doctor or pharmacy?: 1 - Never  Diabetic:  Nutrition Risk Assessment:  Has the patient had any N/V/D within the last 2 months?  No  Does the patient have any non-healing wounds?  No  Has the patient had any unintentional weight loss or weight gain?  No   Diabetes:  Is the patient diabetic?  No  If diabetic, was a CBG obtained today?   N/A Did the patient bring in their glucometer from home?   N/A How often do you monitor your  CBG's? N/A.   Financial Strains and Diabetes Management:  Are you having any financial strains with the device, your supplies or your medication?  N/A .  Does the patient want to be seen by Chronic Care Management for management of their diabetes?   N/A Would the patient like to be referred to a Nutritionist or for Diabetic Management?   N/A  Interpreter Needed?: No  Information entered by :: CJohnson, RN   Activities of Daily Living In your present state of health, do you have any difficulty performing the following activities: 08/25/2020  Hearing? N  Vision? N  Difficulty concentrating or making decisions? N  Walking or climbing stairs? N  Dressing or bathing? N  Doing errands, shopping? N  Preparing Food and eating ? N  Using the Toilet? N  In the past six months, have you accidently leaked urine? N  Do you have problems with loss of bowel control? N  Managing your Medications? N  Managing your Finances? N  Housekeeping or managing your Housekeeping? N  Some recent data might be hidden    Patient Care Team: Tower, Wynelle Fanny, MD as PCP - General  Indicate any recent Medical Services you may have received from other than [facility] providers in the past year (date may be approximate).     Assessment:   This is a routine wellness examination for Rachel Carlson.  Hearing/Vision screen Vision Screening - Comments:: Patient gets annual eye exams   Dietary issues and exercise activities discussed: Current Exercise Habits: The patient does not participate in regular exercise at present, Exercise limited by: None identified   Goals Addressed  This Visit's Progress    Patient Stated       08/25/2020, I will maintain and continue medications as prescribed.       Depression Screen PHQ 2/9 Scores 08/25/2020 08/25/2019 08/07/2018 07/15/2017 10/20/2015 07/20/2015 07/12/2014  PHQ - 2 Score 0 0 0 0 0 0 0  PHQ- 9 Score 0 0 - - - - -    Fall Risk Fall Risk  08/25/2020 08/25/2019  08/07/2018 08/30/2017 07/15/2017  Falls in the past year? 0 1 0 No No  Comment - tripped in mud puddle - Emmi Telephone Survey: data to providers prior to load -  Number falls in past yr: 0 0 - - -  Injury with Fall? 0 0 - - -  Risk for fall due to : No Fall Risks No Fall Risks - - -  Follow up Falls evaluation completed;Falls prevention discussed Falls evaluation completed;Falls prevention discussed - - -    FALL RISK PREVENTION PERTAINING TO THE HOME:  Any stairs in or around the home? Yes  If so, are there any without handrails? No  Home free of loose throw rugs in walkways, pet beds, electrical cords, etc? Yes  Adequate lighting in your home to reduce risk of falls? Yes   ASSISTIVE DEVICES UTILIZED TO PREVENT FALLS:  Life alert? No  Use of a cane, walker or w/c? No  Grab bars in the bathroom? No  Shower chair or bench in shower? No  Elevated toilet seat or a handicapped toilet? No   TIMED UP AND GO:  Was the test performed?  N/A telephone visit .    Cognitive Function: MMSE - Mini Mental State Exam 08/25/2020 08/25/2019 10/20/2015  Orientation to time 5 5 5   Orientation to Place 5 5 5   Registration 3 3 3   Attention/ Calculation 5 5 0  Recall 3 3 3   Language- name 2 objects - - 0  Language- repeat 1 1 1   Language- follow 3 step command - - 3  Language- read & follow direction - - 0  Write a sentence - - 0  Copy design - - 0  Total score - - 20  Mini Cog  Mini-Cog screen was completed. Maximum score is 22. A value of 0 denotes this part of the MMSE was not completed or the patient failed this part of the Mini-Cog screening.       Immunizations Immunization History  Administered Date(s) Administered   Influenza Split 12/04/2010, 10/31/2015   Influenza Whole 12/29/2008, 11/22/2009   Influenza, High Dose Seasonal PF 11/20/2018   Influenza,inj,Quad PF,6+ Mos 12/06/2017   Influenza-Unspecified 11/29/2012, 01/04/2016, 10/30/2016   Moderna Sars-Covid-2 Vaccination  04/02/2019, 05/05/2019   Pneumococcal Conjugate-13 07/20/2015   Pneumococcal-Unspecified 01/30/2013   Td 12/29/2005   Tdap 10/31/2015   Zoster Recombinat (Shingrix) 01/11/2017, 04/11/2017   Zoster, Live 06/10/2009    TDAP status: Up to date  Flu Vaccine status: due Fall 2022  Pneumococcal vaccine status: Up to date  Covid-19 vaccine status: Completed 2 vaccines  Qualifies for Shingles Vaccine? Yes   Zostavax completed Yes   Shingrix Completed?: Yes  Screening Tests Health Maintenance  Topic Date Due   COVID-19 Vaccine (3 - Booster for Moderna series) 09/01/2020 (Originally 10/05/2019)   INFLUENZA VACCINE  08/29/2020   MAMMOGRAM  08/05/2021   TETANUS/TDAP  10/30/2025   COLONOSCOPY (Pts 45-80yr Insurance coverage will need to be confirmed)  01/01/2027   DEXA SCAN  Completed   Hepatitis C Screening  Completed   PNA  vac Low Risk Adult  Completed   Zoster Vaccines- Shingrix  Completed   HPV VACCINES  Aged Out    Health Maintenance  There are no preventive care reminders to display for this patient.  Colorectal cancer screening: Type of screening: Colonoscopy. Completed 01/01/2020. Repeat every 7 years  Mammogram status: Completed 08/05/2020. Repeat every year  Bone Density status: Completed 07/20/2019. Results reflect: Bone density results: OSTEOPENIA. Repeat every 2 years.  Lung Cancer Screening: (Low Dose CT Chest recommended if Age 95-80 years, 30 pack-year currently smoking OR have quit w/in 15 years.) does not qualify.    Additional Screening:  Hepatitis C Screening: does qualify; Completed 07/12/2014  Vision Screening: Recommended annual ophthalmology exams for early detection of glaucoma and other disorders of the eye. Is the patient up to date with their annual eye exam?  Yes  Who is the provider or what is the name of the office in which the patient attends annual eye exams? In the process of finding a new eye doctor  If pt is not established with a provider,  would they like to be referred to a provider to establish care? No .   Dental Screening: Recommended annual dental exams for proper oral hygiene  Community Resource Referral / Chronic Care Management: CRR required this visit?  No   CCM required this visit?  No      Plan:     I have personally reviewed and noted the following in the patient's chart:   Medical and social history Use of alcohol, tobacco or illicit drugs  Current medications and supplements including opioid prescriptions.  Functional ability and status Nutritional status Physical activity Advanced directives List of other physicians Hospitalizations, surgeries, and ER visits in previous 12 months Vitals Screenings to include cognitive, depression, and falls Referrals and appointments  In addition, I have reviewed and discussed with patient certain preventive protocols, quality metrics, and best practice recommendations. A written personalized care plan for preventive services as well as general preventive health recommendations were provided to patient.   Due to this being a telephonic visit, the after visit summary with patients personalized plan was offered to patient via office or my-chart. Patient preferred to pick up at office at next visit or via mychart.   Andrez Grime, LPN   [id number]

## 2020-09-13 NOTE — Progress Notes (Signed)
[facility] New Patient Evaluation and Consultation  Referring Provider: Gatha Mayer, MD PCP: Abner Greenspan, MD Date of Service: 09/14/2020  SUBJECTIVE Chief Complaint: New Patient (Initial Visit) Rachel Carlson is a 75 y.o. female here for a consult)  History of Present Illness: Rachel Carlson is a 75 y.o. White or Caucasian female seen in consultation at the request of Dr. Carlean Purl for evaluation of fecal and urinary urgency.    Review of records significant for: Has fecal urgency and urge urinary incontinence. Has a history of bladder neck suspension.   Per records from [facility], she had a hysterectomy with Burch procedure.   Urinary Symptoms: Leaks urine with lifting, going from sitting to standing, with a full bladder, with movement to the bathroom, and with urgency UUI >> SUI Leaks 10 time(s) per day.  Pad use: uses depends She is bothered by her UI symptoms. Has tried kegel exercises. Also had a burch procedure at the time of hysterectomy  Day time voids 7-12.  Nocturia: 4-6 times per night to void. Voiding dysfunction: she empties her bladder well.  does not use a catheter to empty bladder.  When urinating, she feels she has no difficulties Drinks: 2 cups decaf coffee in AM, 1 in the evening. 1-2 glasses of coke, 32 oz water per day  UTIs:  0  UTI's in the last year.   Denies history of blood in urine and kidney or bladder stones  Pelvic Organ Prolapse Symptoms:                  She Admits to a feeling of a bulge the vaginal area.  Feels like vagina is bulging out.  This bulge is bothersome. Reports history of "bladder tack" with hysterectomy and gartner's duct cyst removal.   Bowel Symptom: Bowel movements: 1-6 time(s) per day (has ulcerative colitis) Stool consistency: soft  Straining: no.  Splinting: no.  Incomplete evacuation: no.  She Denies accidental bowel leakage / fecal incontinence Bowel regimen: none   Sexual  Function Sexually active: no.   Pelvic Pain Denies pelvic pain  Has a vaginal burning sensation. Starts at night, not every day. Makes her feel like she has to urinate a lot. Denies dysuria. A cold rag helps with the sensation.  Has a pressure sensation in the pelvis.   Past Medical History:  Past Medical History:  Diagnosis Date   Blood transfusion without reported diagnosis    Chronic right ear pain    normal MRI   Esophageal stricture    Family history of breast cancer    Family history of colon cancer    Family history of melanoma    Family history of prostate cancer    Fibroids    uterine, history of   GERD (gastroesophageal reflux disease)    History of uterine prolapse    Hx of adenomatous polyp of colon 10/19/2014   Hyperlipidemia    Obesity    PVC (premature ventricular contraction)    first dx by holter in 1980's, echo (6/10) EF 53-97%, normal diastolic fxn, normal size RV and fxn, mild MR, PASP 68mHg   Tubal pregnancy    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 Silvano Rusk   ECTOPIC PREGNANCY SURGERY  1980   ESOPHAGOGASTRODUODENOSCOPY (EGD) WITH ESOPHAGEAL DILATION  04/03/12   Dr. Silvano Rusk   PARTIAL HYSTERECTOMY     abdominal, fibroids, and prolapse (1998) bladder tack   UPPER GASTROINTESTINAL ENDOSCOPY       Past OB/GYN History: OB History  Gravida Para Term Preterm AB Living  4       1 3   SAB IAB Ectopic Multiple  Live Births      1   3    # Outcome Date GA Lbr Len/2nd Weight Sex Delivery Anes PTL Lv  4 Gravida      Vag-Forceps     3 Gravida      Vag-Spont     2 Gravida      Vag-Spont     1 Ectopic            Menopausal: Yes S/p hysterectomy   Medications: She has a current medication list which includes the following prescription(s): vitamin c, aspirin, calcium citrate-vitamin d, vitamin d-3, co q-10, cranberry, [START ON 09/15/2020] estradiol, glucosamine-chondroitin, grape seed, lansoprazole, lialda, milk thistle, mirabegron er, omega-3 fatty acids, polyethyl glycol-propyl glycol, rosuvastatin, vitamin b-12, and zinc gluconate.   Allergies: Patient is allergic to pantoprazole sodium, ranitidine, omeprazole, and sulfasalazine.   Social History:  Social History   Tobacco Use   Smoking status: Never   Smokeless tobacco: Never  Vaping Use   Vaping Use: Never used  Substance Use Topics   Alcohol use: No    Alcohol/week: 0.0 standard drinks    Comment: rarely beer   Drug use: No    Relationship status: married She lives with husband.   She is not employed. Regular exercise: No History of abuse: No  Family History:   Family History  Problem Relation Age of Onset   Sudden death Father 32       "coronary arteriosclerosis" on death certificate   Heart attack Mother 15   Transient ischemic attack Mother    Diabetes type II Mother    Sudden death Mother        died age 38   Diabetes Mother    Coronary artery disease Mother    Skin cancer Mother    Barrett's esophagus Mother    Arrhythmia Brother    Colon cancer Brother 62   Breast cancer Sister        dx in her 49s   Colon polyps Sister        adenomatous   Colon cancer Son 50       lynch syndrome   Melanoma Maternal Uncle    Congestive Heart Failure Maternal Grandmother    Prostate cancer Maternal Grandfather        dx in 68s   Tuberculosis Paternal Grandfather    Skin cancer Maternal Aunt    Barrett's esophagus  Maternal Aunt    Other Son        parotid gland tumor   Ehlers-Danlos syndrome Daughter    Colon cancer Cousin        mid 40s; maternal cousin   Colon cancer Other        MGMs brother   Esophageal cancer Neg Hx  Stomach cancer Neg Hx    Rectal cancer Neg Hx      Review of Systems: Review of Systems  Constitutional:  Negative for fever, malaise/fatigue and weight loss.  Respiratory:  Negative for cough, shortness of breath and wheezing.   Cardiovascular:  Negative for chest pain, palpitations and leg swelling.  Gastrointestinal:  Negative for abdominal pain and blood in stool.  Genitourinary:  Negative for dysuria.  Musculoskeletal:  Negative for myalgias.  Skin:  Negative for rash.  Neurological:  Negative for dizziness and headaches.  Endo/Heme/Allergies:  Does not bruise/bleed easily.  Psychiatric/Behavioral:  Negative for depression. The patient is not nervous/anxious.     OBJECTIVE Physical Exam: Vitals:   09/14/20 1049  BP: 122/82  Pulse: 61  Weight: 199 lb (90.3 kg)  Height: 5' 1"  (1.549 m)    Physical Exam   GU / Detailed Urogynecologic Evaluation:  Pelvic Exam: Normal external female genitalia; Bartholin's and Skene's glands normal in appearance; urethral meatus normal in appearance, no urethral masses or discharge.   CST: negative  s/p hysterectomy: Speculum exam reveals normal vaginal mucosa with  atrophy and normal vaginal cuff.  Adnexa no mass, fullness, tenderness.     Pelvic floor strength I/V  Pelvic floor musculature: Right levator tender, Right obturator tender, Left levator non-tender, Left obturator tender  POP-Q:   POP-Q  -0.5                                            Aa   -0.5                                           Ba  -6                                              C   4                                            Gh  3                                            Pb  8                                            tvl   -3                                             Ap  -3  Bp                                                 D     Rectal Exam:  Normal external rectum  Post-Void Residual (PVR) by Bladder Scan: In order to evaluate bladder emptying, we discussed obtaining a postvoid residual and she agreed to this procedure.  Procedure: The ultrasound unit was placed on the patient's abdomen in the suprapubic region after the patient had voided. A PVR of 0 ml was obtained by bladder scan.  Laboratory Results: POC urine: negative   ASSESSMENT AND PLAN Rachel Carlson is a 75 y.o. with:  1. Overactive bladder   2. Urinary frequency   3. Prolapse of anterior vaginal wall   4. Vaginal atrophy     OAB We discussed the symptoms of overactive bladder (OAB), which include urinary urgency, urinary frequency, nocturia, with or without urge incontinence.  While we do not know the exact etiology of OAB, several treatment options exist. We discussed management including behavioral therapy (decreasing bladder irritants, urge suppression strategies, timed voids, bladder retraining), physical therapy, medication; for refractory cases posterior tibial nerve stimulation, sacral neuromodulation, and intravesical botulinum toxin injection.  - Will prescribe Myrbetriq 33m daily. For Beta-3 agonist medication, we discussed the potential side effect of elevated blood pressure which is more likely to occur in individuals with uncontrolled hypertension. - She will also work on reducing bladder irritants. Physical therapy referral placed.   2. Stage II anterior, Stage I posterior, Stage I apical prolapse For treatment of pelvic organ prolapse, we discussed options for management including expectant management, conservative management, and surgical management, such as Kegels, a pessary, pelvic floor physical therapy, and specific surgical procedures. - She would like to try a pessary, will  return for a fitting.   3. Vaginal atrophy - can explain burning sensation -For symptomatic vaginal atrophy options include lubrication with a water-based lubricant, personal hygiene measures and barrier protection against wetness, and estrogen replacement in the form of vaginal cream, vaginal tablets, or a time-released vaginal ring.   - She will use estrogen cream 0.5g nightly for two weeks then twice a week after. Also will use coconut oil in between.   Return for pessary fitting.   MJaquita Folds MD   Medical Decision Making:  - Reviewed/ ordered a clinical laboratory test - Review and summation of prior records

## 2020-09-14 ENCOUNTER — Encounter: Payer: Self-pay | Admitting: Obstetrics and Gynecology

## 2020-09-14 ENCOUNTER — Other Ambulatory Visit: Payer: Self-pay

## 2020-09-14 ENCOUNTER — Ambulatory Visit (INDEPENDENT_AMBULATORY_CARE_PROVIDER_SITE_OTHER): Payer: Medicare Other | Admitting: Obstetrics and Gynecology

## 2020-09-14 VITALS — BP 122/82 | HR 61 | Ht 61.0 in | Wt 199.0 lb

## 2020-09-14 DIAGNOSIS — N3281 Overactive bladder: Secondary | ICD-10-CM

## 2020-09-14 DIAGNOSIS — N8110 Cystocele, unspecified: Secondary | ICD-10-CM | POA: Diagnosis not present

## 2020-09-14 DIAGNOSIS — N952 Postmenopausal atrophic vaginitis: Secondary | ICD-10-CM | POA: Diagnosis not present

## 2020-09-14 DIAGNOSIS — R350 Frequency of micturition: Secondary | ICD-10-CM

## 2020-09-14 LAB — POCT URINALYSIS DIPSTICK
Appearance: NORMAL
Bilirubin, UA: NEGATIVE
Blood, UA: NEGATIVE
Glucose, UA: NEGATIVE
Ketones, UA: NEGATIVE
Leukocytes, UA: NEGATIVE
Nitrite, UA: NEGATIVE
Protein, UA: NEGATIVE
Spec Grav, UA: 1.025 (ref 1.010–1.025)
Urobilinogen, UA: 0.2 E.U./dL
pH, UA: 6.5 (ref 5.0–8.0)

## 2020-09-14 MED ORDER — MIRABEGRON ER 25 MG PO TB24: 25.0000 mg | ORAL_TABLET | Freq: Every day | ORAL | 5 refills | Status: DC

## 2020-09-14 MED ORDER — ESTRADIOL 0.1 MG/GM VA CREA
0.5000 g | TOPICAL_CREAM | VAGINAL | 11 refills | Status: DC
Start: 2020-09-15 — End: 2021-09-08

## 2020-09-14 NOTE — Patient Instructions (Addendum)
Today we talked about ways to manage bladder urgency such as altering your diet to avoid irritative beverages and foods (bladder diet) as well as attempting to decrease stress and other exacerbating factors.    The Most Bothersome Foods* The Least Bothersome Foods*  Coffee - Regular & Decaf Tea - caffeinated Carbonated beverages - cola, non-colas, diet & caffeine-free Alcohols - Beer, Red Wine, White Wine, Champagne Fruits - Grapefruit, [place], Orange, [employer] - Cranberry, Grapefruit, Orange, Pineapple Vegetables - Tomato & Tomato Products Flavor Enhancers - Hot peppers, Spicy foods, Chili, Horseradish, Vinegar, Monosodium glutamate (MSG) Artificial Sweeteners - NutraSweet, Sweet 'N Low, Equal (sweetener), Saccharin Ethnic foods - [place], [place], [place] food Express Scripts - low-fat & whole Fruits - Bananas, Blueberries, Honeydew melon, Pears, Raisins, Watermelon Vegetables - Broccoli, Brussels Sprouts, [place], Carrots, Cauliflower, [place], Cucumber, Mushrooms, Peas, Radishes, Squash, Zucchini, White potatoes, Sweet potatoes & yams Poultry - Chicken, Eggs, [place], [employer] - Beef, Programmer, multimedia, Lamb Seafood - Shrimp, McLean fish, Salmon Grains - Oat, Rice Snacks - Pretzels, Popcorn  *Lissa Morales et al. Diet and its role in interstitial cystitis/bladder pain syndrome (IC/BPS) and comorbid conditions. [place] 2012 Jan 11.    Start vaginal estrogen therapy nightly for two weeks then 2 times weekly at night for treatment of vaginal atrophy (dryness of the vaginal tissues).  Please let us know if the prescription is too expensive and we can look for alternative options.   Vulvovaginal moisturizer Options: Vitamin E oil (pump or capsule) or cream (Gene's Vit E Cream) Coconut oil Silicone-based lubricant for use during intercourse ("wet platinum" is a brand available at most drugstores) Crisco Consider the ingredients of the product - the fewer the ingredients the  better!  Directions for Use: Clean and dry your hands Gently dab the vulvar/vaginal area dry as needed Apply a "pea-sized" amount of the moisturizer onto your fingertip Using you other hand, open the labia  Apply the moisturizer to the vulvar/vaginal tissues Wear loose fitting underwear/clothing if possible following application Use moisturize up to 3 times daily as desired.

## 2020-09-26 NOTE — Progress Notes (Signed)
[facility]   Subjective:     Chief Complaint: No chief complaint on file.  History of Present Illness: Rachel Carlson is a 75 y.o. female with stage II pelvic organ prolapse and OAB who presents today for a pessary fitting.   Still having difficulty controlling on the way to the bathroom. Has been using Myrbetriq for about 2 weeks and has not noticed much of a difference yet.   Still having some burning at the opening of the vagina. Is just finishing two weeks of estrogen use.   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   Past Surgical History: She  has a past surgical history that includes Ectopic pregnancy surgery (1980); Partial hysterectomy; Bladder neck suspension (1998); Esophagogastroduodenoscopy (egd) with esophageal dilation (04/03/12); Upper gastrointestinal endoscopy; Breast biopsy (Left); Colonoscopy (05/18/2011/10/12/14); Cataract extraction (Right, 01/2020); and Cataract extraction w/PHACO (Left, 08/12/2020).   Medications: She has a current medication list which includes the following prescription(s): vitamin c, aspirin, calcium citrate-vitamin d, vitamin d-3, co q-10, cranberry, estradiol, glucosamine-chondroitin, grape seed, lansoprazole, lialda, milk thistle, mirabegron er, omega-3 fatty acids, polyethyl glycol-propyl glycol, rosuvastatin, vitamin b-12, and zinc gluconate.   Allergies: Patient is allergic to pantoprazole sodium, ranitidine,  omeprazole, and sulfasalazine.   Social History: Patient  reports that she has never smoked. She has never used smokeless tobacco. She reports that she does not drink alcohol and does not use drugs.      Objective:    LMP 05/29/1996  Gen: No apparent distress, A&O x 3. Pelvic Exam: Normal external female genitalia; Bartholin's and Skene's glands normal in appearance; urethral meatus normal in appearance, no urethral masses or discharge.   A size 2 ring with support pessary was fitted, but this was too large and removed. A size 1 ring with support was placed but this was uncomfortable and removed. A size 0 ring with support was placed, Lot # [id number], Exp 01/28/22. It was comfortable, stayed in place with valsalva and was an appropriate size on examination, with one finger fitting between the pessary and the vaginal walls.   POP-Q (09/14/20):    POP-Q   -0.5                                            Aa   -0.5                                           Ba   -6                                              C    4  Gh   3                                            Pb   8                                            tvl    -3                                            Ap   -3                                            Bp                                                  D     Assessment/Plan:    Assessment: Rachel Carlson is a 75 y.o. with stage II pelvic organ prolapse and OAB and vaginal atrophy   Plan: POP - She was fitted with a #0 ring with support pessary. She will keep the pessary in place until next visit.   OAB - Continue myrbetriq for a few more weeks to give a chance to see improvement.   Atrophy - continue estrace twice a week - can use coconut oil or vitamin E cream on days not using estrogen - use a peri bottle instead of toilet paper to decrease irritation  Follow-up in 2 weeks for a pessary check or sooner as  needed.  All questions were answered.    [name], MD  Time spent: I spent 15 minutes dedicated to the care of this patient on the date of this encounter to include pre-visit review of records, face-to-face time with the patient and post visit documentation. Additional time was spent on the pessary fitting.

## 2020-09-27 ENCOUNTER — Ambulatory Visit (INDEPENDENT_AMBULATORY_CARE_PROVIDER_SITE_OTHER): Payer: Medicare Other | Admitting: Obstetrics and Gynecology

## 2020-09-27 ENCOUNTER — Encounter: Payer: Self-pay | Admitting: Obstetrics and Gynecology

## 2020-09-27 ENCOUNTER — Other Ambulatory Visit: Payer: Self-pay

## 2020-09-27 VITALS — BP 138/78 | HR 74 | Wt 199.0 lb

## 2020-09-27 DIAGNOSIS — N952 Postmenopausal atrophic vaginitis: Secondary | ICD-10-CM | POA: Diagnosis not present

## 2020-09-27 DIAGNOSIS — N8110 Cystocele, unspecified: Secondary | ICD-10-CM

## 2020-09-27 DIAGNOSIS — N3281 Overactive bladder: Secondary | ICD-10-CM | POA: Diagnosis not present

## 2020-09-27 NOTE — Patient Instructions (Signed)
Use a peri bottle or squirt bottle to cleanse the vaginal area then pat dry with a cloth (try not to rub).

## 2020-10-10 ENCOUNTER — Encounter: Payer: Self-pay | Admitting: Obstetrics and Gynecology

## 2020-10-10 ENCOUNTER — Other Ambulatory Visit: Payer: Self-pay

## 2020-10-10 ENCOUNTER — Ambulatory Visit (INDEPENDENT_AMBULATORY_CARE_PROVIDER_SITE_OTHER): Payer: Medicare Other | Admitting: Obstetrics and Gynecology

## 2020-10-10 VITALS — BP 126/73 | HR 77 | Wt 199.0 lb

## 2020-10-10 DIAGNOSIS — N952 Postmenopausal atrophic vaginitis: Secondary | ICD-10-CM | POA: Diagnosis not present

## 2020-10-10 DIAGNOSIS — N3281 Overactive bladder: Secondary | ICD-10-CM | POA: Diagnosis not present

## 2020-10-10 DIAGNOSIS — N8110 Cystocele, unspecified: Secondary | ICD-10-CM | POA: Diagnosis not present

## 2020-10-10 MED ORDER — MIRABEGRON ER 50 MG PO TB24: 50.0000 mg | ORAL_TABLET | Freq: Every day | ORAL | 30 refills | Status: DC

## 2020-10-10 NOTE — Progress Notes (Signed)
[facility]   Subjective:     Chief Complaint:  Chief Complaint  Patient presents with   Pessary Check    Rachel Carlson is a 75 y.o. female said her pessary fell out in the toilet and she flushed it   History of Present Illness: Rachel Carlson is a 75 y.o. female with stage II pelvic organ prolapse and OAB who presents for a pessary check. She is using a size #0 ring with support pessary. The pessary fell out when she went home. She is using vaginal estrogen and coconut oil and continues to have burning.   Has been on Myrbetriq 72m. Frequency has improved, but still hard to control urine when she is close to the bathroom. Still leaks every time she goes to the bathroom.   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   Past Surgical History: She  has a past surgical history that includes Ectopic pregnancy surgery (1980); Partial hysterectomy; Bladder neck suspension (1998); Esophagogastroduodenoscopy (egd) with esophageal dilation (04/03/12); Upper gastrointestinal endoscopy; Breast biopsy (Left); Colonoscopy (05/18/2011/10/12/14); Cataract extraction (Right, 01/2020); and Cataract extraction w/PHACO (Left, 08/12/2020).   Medications: She has a current medication list which includes the following prescription(s): mirabegron er, vitamin c, aspirin, calcium citrate-vitamin d, vitamin d-3, co q-10, cranberry, estradiol, glucosamine-chondroitin, grape seed,  lansoprazole, lialda, milk thistle, mirabegron er, omega-3 fatty acids, polyethyl glycol-propyl glycol, rosuvastatin, vitamin b-12, and zinc gluconate.   Allergies: Patient is allergic to pantoprazole sodium, ranitidine, omeprazole, and sulfasalazine.   Social History: Patient  reports that she has never smoked. She has never used smokeless tobacco. She reports that she does not drink alcohol and does not use drugs.      Objective:    Physical Exam: BP 126/73   Pulse 77   Wt 199 lb (90.3 kg)   LMP 05/29/1996   BMI 37.60 kg/m  Gen: No apparent distress, A&O x 3. Detailed Urogynecologic Evaluation:  Pelvic Exam: Normal external female genitalia; Bartholin's and Skene's glands normal in appearance; urethral meatus normal in appearance, no urethral masses or discharge.   A gehrung pessary was attempted to be place but did not fit well. A LS 1-3/4 gellhorn was placed but the stem protruded and was uncomfortable. Therefore a ss 1-3/4 in gellhorn was placed. This was comfortable and It was comfortable, fit well, and stayed in placed with strong cough, valsalva and bending.  Lot # [id number] 11/04/24   POP-Q (09/14/20):    POP-Q   -0.5                                            Aa   -0.5                                           Ba   -6  C    4                                            Gh   3                                            Pb   8                                            tvl    -3                                            Ap   -3                                            Bp                                                  D       Assessment/Plan:    Assessment: Rachel Carlson is a 75 y.o. with stage II pelvic organ prolapse and OAB here for a pessary check. She is doing well.  Plan: - ss 1-3/4in gellhorn pessary placed today. Will keep in place until next visit.  - She will continue the vaginal estrogen.  Instead of coconut oil, will try Vitamin E cream.  - Increase Myrbetriq to 37m, new prescription sent.   Return 3 weeks  Time spent: I spent 20 minutes dedicated to the care of this patient on the date of this encounter to include pre-visit review of records, face-to-face time with the patient discussing and post visit documentation and ordering medication/ testing. Additional time was spent on the procedure.

## 2020-10-10 NOTE — Patient Instructions (Signed)
For vaginal moisture, try Vitamin E cream or V-magic (buy on [address]).

## 2020-10-12 ENCOUNTER — Ambulatory Visit (INDEPENDENT_AMBULATORY_CARE_PROVIDER_SITE_OTHER): Payer: Medicare Other | Admitting: Obstetrics and Gynecology

## 2020-10-12 ENCOUNTER — Other Ambulatory Visit: Payer: Self-pay

## 2020-10-12 ENCOUNTER — Encounter: Payer: Self-pay | Admitting: Obstetrics and Gynecology

## 2020-10-12 VITALS — BP 128/81 | HR 72

## 2020-10-12 DIAGNOSIS — N8110 Cystocele, unspecified: Secondary | ICD-10-CM

## 2020-10-12 NOTE — Progress Notes (Signed)
[facility]   Subjective:     Chief Complaint:  Chief Complaint  Patient presents with   Pessary Check   History of Present Illness: Rachel Carlson is a 75 y.o. female with stage II pelvic organ prolapse and OAB who presents for a pessary check. Was fit with a 1-3/4in ss gellhorn pessary but this fell out. Denies vaginal bleeding.   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   Past Surgical History: She  has a past surgical history that includes Ectopic pregnancy surgery (1980); Partial hysterectomy; Bladder neck suspension (1998); Esophagogastroduodenoscopy (egd) with esophageal dilation (04/03/12); Upper gastrointestinal endoscopy; Breast biopsy (Left); Colonoscopy (05/18/2011/10/12/14); Cataract extraction (Right, 01/2020); and Cataract extraction w/PHACO (Left, 08/12/2020).   Medications: She has a current medication list which includes the following prescription(s): vitamin c, aspirin, calcium citrate-vitamin d, vitamin d-3, co q-10, cranberry, estradiol, glucosamine-chondroitin, grape seed, lansoprazole, lialda, milk thistle, mirabegron er, mirabegron er, omega-3 fatty acids, polyethyl glycol-propyl glycol, rosuvastatin, vitamin b-12, and zinc gluconate.   Allergies: Patient is allergic to pantoprazole sodium, ranitidine, omeprazole, and sulfasalazine.   Social History: Patient  reports that she has never smoked. She has never used smokeless tobacco. She  reports that she does not drink alcohol and does not use drugs.      Objective:    Physical Exam: BP 128/81   Pulse 72   LMP 05/29/1996  Gen: No apparent distress, A&O x 3. Detailed Urogynecologic Evaluation:  Pelvic Exam: Normal external female genitalia; Bartholin's and Skene's glands normal in appearance; urethral meatus normal in appearance, no urethral masses or discharge.   A 2in ss gellhorn was placed. It protruded slightly and moved with valsalva. This was removed. A #2 (1-3/8 in) cube pessary was placed. It was comfortable, fit well, and stayed in placed with valsalva and bending.  Lot # [id number] Exp 04/09/24  POP-Q (09/14/20):    POP-Q   -0.5                                            Aa   -0.5                                           Ba   -6                                              C    4                                            Gh   3  Pb   8                                            tvl    -3                                            Ap   -3                                            Bp                                                  D       Assessment/Plan:    Assessment: Rachel Carlson is a 75 y.o. with stage II pelvic organ prolapse and OAB here for a pessary check. She is doing well.  Plan: - A #2 cube pessary was placed today. Has follow up scheduled in 3 weeks.   All questions were answered.  [name], MD

## 2020-10-13 ENCOUNTER — Encounter: Payer: Self-pay | Admitting: Physical Therapy

## 2020-10-13 ENCOUNTER — Encounter: Payer: Medicare Other | Attending: Family Medicine | Admitting: Physical Therapy

## 2020-10-13 ENCOUNTER — Telehealth: Payer: Self-pay

## 2020-10-13 DIAGNOSIS — M6281 Muscle weakness (generalized): Secondary | ICD-10-CM | POA: Insufficient documentation

## 2020-10-13 DIAGNOSIS — R278 Other lack of coordination: Secondary | ICD-10-CM | POA: Diagnosis not present

## 2020-10-13 DIAGNOSIS — N3281 Overactive bladder: Secondary | ICD-10-CM | POA: Diagnosis not present

## 2020-10-13 NOTE — Therapy (Signed)
[facility] [address], [place], [place], [address] Phone: [phone]   Fax:  [phone]  Physical Therapy Evaluation  Patient Details  Name: Rachel Carlson MRN: [id number] Date of Birth: [birth year] Referring Provider (PT): Dr. Sherlene Shams   Encounter Date: 10/13/2020   PT End of Session - 10/13/20 1700     Visit Number 1    Date for PT Re-Evaluation 01/05/21    Authorization Type [place] - Visit Number 1    Authorization - Number of Visits 10    PT Start Time 1500    PT Stop Time 1600    PT Time Calculation (min) 60 min    Activity Tolerance Patient tolerated treatment well    Behavior During Therapy WFL for tasks assessed/performed             Past Medical History:  Diagnosis Date   Blood transfusion without reported diagnosis    Chronic right ear pain    normal MRI   Esophageal stricture    Family history of breast cancer    Family history of colon cancer    Family history of melanoma    Family history of prostate cancer    Fibroids    uterine, history of   GERD (gastroesophageal reflux disease)    History of uterine prolapse    Hx of adenomatous polyp of colon 10/19/2014   Hyperlipidemia    Obesity    PVC (premature ventricular contraction)    first dx by holter in 1980's, echo (6/10) EF 56-38%, normal diastolic fxn, normal size RV and fxn, mild MR, PASP 8mHg   Tubal pregnancy    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 CSilvano Rusk  ECTOPIC PREGNANCY SURGERY  1980   ESOPHAGOGASTRODUODENOSCOPY (EGD) WITH ESOPHAGEAL DILATION  04/03/12   Dr. CSilvano Rusk  PARTIAL HYSTERECTOMY     abdominal, fibroids, and prolapse (1998) bladder tack   UPPER GASTROINTESTINAL ENDOSCOPY      There were no vitals filed for this visit.    Subjective Assessment - 10/13/20 1456     Subjective Patient has had urinary issues for year. Patient had a partial hysterectomy in 1998 with bladder neck suspension and did not work. She has a pessary in and feels it with sitting and bedning forward. I have used the Mybetriq that helps with the overactive bladder but has stress incontinence. Patient is tired wearing the Depends underwear.    Patient Stated Goals strengthen pelvic floor; manage prolapse    Currently in Pain? Yes    Pain Score 2     Pain Location Vagina    Pain Orientation Mid    Pain Descriptors / Indicators Other (Comment)   aggravating   Pain Type Acute pain    Pain Onset More than a month ago    Pain Frequency Constant    Aggravating Factors  wearing the pessary  Pain Relieving Factors not having the pessary in    Multiple Pain Sites No                [facility] PT Assessment - 10/13/20 0001       Assessment   Medical Diagnosis N32.81 Overactive bladder    Referring Provider (PT) Dr. Sherlene Shams    Onset Date/Surgical Date --   chronic   Prior Therapy using pessary      Precautions   Precautions None      Restrictions   Weight Bearing Restrictions No      Balance Screen   Has the patient fallen in the past 6 months No    Has the patient had a decrease in activity level because of a fear of falling?  No    Is the patient reluctant to leave their home because of a fear of falling?  No      Home Ecologist residence      Prior Function   Level of Independence Independent    Leisure takes  care of husband to get out of a chair, help with getting off the floor due to husband a fall risk;   not on walking program due to plantar fascitis     Cognition   Overall Cognitive Status Within Functional Limits for tasks assessed      Posture/Postural Control   Posture/Postural Control Postural limitations    Postural Limitations Increased thoracic kyphosis      ROM / Strength   AROM / PROM / Strength AROM;PROM;Strength      AROM   Overall AROM Comments full lumbar ROM      PROM   Right Hip Extension 0    Right Hip External Rotation  40    Left Hip Extension 0    Left Hip External Rotation  45      Strength   Overall Strength Comments tigthen the abdominals and bulge the upper and lower outward, trouble engaging the upper abdominals    Right Hip Flexion 4/5    Right Hip ABduction 4-/5    Left Hip Flexion 4/5    Left Hip ABduction 3+/5                        Objective measurements completed on examination: See above findings.     Pelvic Floor Special Questions - 10/13/20 0001     Prior Pregnancies Yes    Number of Pregnancies 3    Number of Vaginal Deliveries 3    Any difficulty with labor and deliveries Yes   had stitches   Urinary Leakage Yes    Pad use dependes with toilet paper to reduce the need to change often    Activities that cause leaking Other;With strong urge    Other activities that cause leaking closer to get to the bathroom less control she has; when has urge and bends forward to pick up something, when shifting positions in bed when sleeping will have to urinate    Urinary urgency Yes    Urinary frequency every 2 hours    Fecal incontinence No    Falling out feeling (prolapse) Yes    External Perineal Exam you can see the end of the cube coming out of the vagina    Skin Integrity Intact    Pelvic Floor Internal Exam Patient confirms identification and approves PT to assess and treat pelvic floor    Exam  Type Vaginal    Palpation  tenderness located on the right obturator internist    Strength Flicker    Strength # of seconds 1                       PT Education - 10/13/20 1555     Education Details Access Code: [email or web address]; educated patient on vaginal moisturizers    Person(s) Educated Patient    Methods Explanation;Demonstration;Verbal cues;Handout    Comprehension Returned demonstration;Verbalized understanding              PT Short Term Goals - 10/13/20 1710       PT SHORT TERM GOAL #1   Title Independent with initial HEP    Time 4    Period Weeks    Status New    Target Date 11/10/20      PT SHORT TERM GOAL #2   Title able to contract the abdominals without bulging out    Time 4    Period Weeks    Status New    Target Date 11/10/20      PT SHORT TERM GOAL #3   Title able to contract the pelvic floor for 3 seconds without holding her breath    Time 4    Period Weeks    Status New    Target Date 11/10/20               PT Long Term Goals - 10/13/20 1711       PT LONG TERM GOAL #1   Title Independent with a HEP.    Time 12    Period Weeks    Status New    Target Date 01/05/21      PT LONG TERM GOAL #2   Title able to hold her urine as she walks to the bathroom and sit on the commode with minimal to no leakage    Time 12    Period Weeks    Status New    Target Date 01/05/21      PT LONG TERM GOAL #3   Title wears a light incontinence pad due to reduction of her urinary leakage    Time 12    Period Weeks    Status New    Target Date 01/05/21      PT LONG TERM GOAL #4   Title pelvic floor strength >/= 3/5 and holding for 10 seconds to be able to walk to bend or move with a full bladder with minimal to no urinary leakage    Time 12    Period Weeks    Status New    Target Date 01/05/21                    Plan - 10/13/20 1559     Clinical Impression Statement Patient is a 75 year old female with overactive bladder for many years. She had a  Partial hysterectomy with bladder neck suspension in 1998. She had a suprapubic catheter for 4 months due to a damaged nerve giving he rpain when she urinated after the surgery. Patient will leak urine with lifting, going from sit to stand with full baldder, bending over with a full bladder, urgency, and walking to the commode when she has the urge. Patient urges reduce when she is takeing the Mybetriq. Patient wears a depends and toilet paper all day so she does not have to change the pad often. She is using the estrogen  cream due to the internal vaginal burning from dinner time to 2:: AM. Patient had a new pessary placed yesterday and it is still remaining in the vaginal canal. She has discomfort in the vaginal canal when she sits due to the pessary. She has tenderness located in the obturator internist and levator ani. Her pelvic floor strength is 1/5 but when she engages the hip adductors and lower abdominals it will be 2/5 for 1 second. She will compensate with holding her breath. She has decreased strength in bilateral hips. She has decrased bilateral hip extension. When she contracts her abdominals she will bulge them outward. Patient will benefit from skilled therapy to reduce her urgency and bladder leakage with strengthing and improved pelvic floor coordination.    Personal Factors and Comorbidities Age;Comorbidity 2;Fitness;Past/Current Experience;Time since onset of injury/illness/exacerbation    Comorbidities Ulcerative Collitis; Partial Hysterectomy wiht bladder neck suspension 1998    Examination-Activity Limitations Bend;Continence;Locomotion Level;Toileting    Stability/Clinical Decision Making Evolving/Moderate complexity    Clinical Decision Making Moderate    Rehab Potential Good    PT Frequency 1x / week    PT Duration 12 weeks    PT Treatment/Interventions ADLs/Self Care Home Management;Biofeedback;Therapeutic activities;Therapeutic exercise;Neuromuscular re-education;Patient/family  education;Manual techniques;Dry needling;Taping;Spinal Manipulations    PT Next Visit Plan urge to void, abdominal contraction without bulging, diaphragmatic breathing, hip strenghtening, hip flexor, hamstring, piriformis stretch    PT Home Exercise Plan Access Code: [email or web address]    Consulted and Agree with Plan of Care Patient             Patient will benefit from skilled therapeutic intervention in order to improve the following deficits and impairments:  Decreased endurance, Decreased activity tolerance, Decreased strength, Increased fascial restricitons, Increased muscle spasms, Decreased coordination  Visit Diagnosis: Muscle weakness (generalized) - Plan: PT plan of care cert/re-cert  Other lack of coordination - Plan: PT plan of care cert/re-cert  Overactive bladder - Plan: PT plan of care cert/re-cert     Problem List Patient Active Problem List   Diagnosis Date Noted   Nonrheumatic mitral valve regurgitation 04/16/2019   Essential hypertension 04/11/2018   PVC's (premature ventricular contractions) 04/11/2018   Class 2 obesity with body mass index (BMI) of 37.0 to 37.9 in adult 12/06/2017   Genetic testing 09/26/2017   Family history of breast cancer    Family history of colon cancer    Family history of prostate cancer    Family history of melanoma    Family history of Lynch syndrome - son 08/04/2017   Prediabetes 07/20/2015   Estrogen deficiency 04/27/2015   GERD (gastroesophageal reflux disease) 03/23/2015   Hx of adenomatous polyp of colon 10/19/2014   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[place]) 45/62/5638   Pain in joint, pelvic region and thigh 07/24/2014   Need for hepatitis C screening test 07/12/2014   Pelvic pain in female 07/12/2014   Vitamin D deficiency 07/03/2014   Encounter for Medicare annual wellness exam 07/08/2013   Osteoporosis 05/24/2013   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

## 2020-10-13 NOTE — Telephone Encounter (Signed)
Rachel Carlson is a 75 y.o. female called in because the prescription for Mybetriq that was increased from 25 to 37m was dispensed as 1 tablet and should be 30.  [place] the correction had been made to 30 tab for 30 days with 1 year refill

## 2020-10-13 NOTE — Patient Instructions (Addendum)
Moisturizers They are used in the vagina to hydrate the mucous membrane that make up the vaginal canal. Designed to keep a more normal acid balance (ph) Once placed in the vagina, it will last between two to three days.  Use 2-3 times per week at bedtime  Ingredients to avoid is glycerin and fragrance, can increase chance of infection Should not be used just before sex due to causing irritation Most are gels administered either in a tampon-shaped applicator or as a vaginal suppository. They are non-hormonal.   Types of Moisturizers(internal use)  Vitamin E vaginal suppositories- Whole foods, Amazon Moist Again Coconut oil- can break down condoms Julva- (Do no use if on Tamoxifen) amazon Yes moisturizer- amazon NeuEve Silk , NeuEve Silver for menopausal or over 65 (if have severe vaginal atrophy or cancer treatments use NeuEve Silk for  1 month than move to [employer]eps[employer])- [employer], [place].com Olive and Bee intimate cream- www.oliveandbee.com.au Mae vaginal moisturizer- Amazon Aloe    Creams to use externally on the Vulva area Alber[employer]on's (good for for cancer patients that had radiation to the area)- [place] or [employer].FlyingBasics.com.br V-magic cream - amazon Julva-amazon Vital "V [place] salve ( help moisturize and help with thinning vulvar area, does have Ogema by Irwin Brakeman labial moisturizer (Amazon,  Coconut or olive oil aloe   Things to avoid in the vaginal area Do not use things to irritate the vulvar area No lotions just specialized creams for the vulva area- Neogyn, V-magic, No soaps; can use Aveeno or Calendula cleanser if needed. Must be gentle No deodorants No douches Good to sleep without underwear to let the vaginal area to air out No scrubbing: spread the lips to let warm water rinse over labias and pat dry   Earlie Counts, PT [facility] [place] [address]  [place], [place] [address] W: [phone] [email or web address]  Access Code: [email or web address] URL: [email or web address] Date: 10/13/2020 Prepared by: Earlie Counts  Exercises Supine Hip Adduction Isometric with Diona Foley - 3 x daily - 7 x weekly - 1 sets - 10 reps - 5 sec hold

## 2020-10-18 ENCOUNTER — Other Ambulatory Visit: Payer: Self-pay

## 2020-10-18 ENCOUNTER — Encounter: Payer: Self-pay | Admitting: Physical Therapy

## 2020-10-18 ENCOUNTER — Ambulatory Visit: Payer: Self-pay | Admitting: Physical Therapy

## 2020-10-18 DIAGNOSIS — M6281 Muscle weakness (generalized): Secondary | ICD-10-CM

## 2020-10-18 DIAGNOSIS — R278 Other lack of coordination: Secondary | ICD-10-CM | POA: Diagnosis not present

## 2020-10-18 DIAGNOSIS — N3281 Overactive bladder: Secondary | ICD-10-CM

## 2020-10-18 NOTE — Patient Instructions (Signed)
Access Code: [email or web address] URL: [email or web address] Date: 10/18/2020 Prepared by: Earlie Counts  Exercises Supine Hip Adduction Isometric with Diona Foley - 3 x daily - 7 x weekly - 1 sets - 10 reps - 5 sec hold Hooklying Isometric Hip Flexion - 1 x daily - 7 x weekly - 1 sets - 5 reps - 5 sec hold Seated Pelvic Floor Contraction with Isometric Hip Adduction - 2 x daily - 7 x weekly - 1 sets - 5 reps - 3 sec hold Earlie Counts, PT [facility] [address] [place], [place] [address] W: [phone] [email or web address]

## 2020-10-18 NOTE — Therapy (Signed)
[facility] at [facility] [address], [place], [place], [address] Phone: [phone]   Fax:  [phone]  Physical Therapy Treatment  Patient Details  Name: Rachel Carlson MRN: [id number] Date of Birth: [birth year] Referring Provider (PT): Dr. Sherlene Shams   Encounter Date: 10/18/2020   PT End of Session - 10/18/20 1038     Visit Number 2    Date for PT Re-Evaluation 01/05/21    Authorization Type [place] - Visit Number 2    Authorization - Number of Visits 10    PT Start Time 0930    PT Stop Time 1020    PT Time Calculation (min) 50 min    Activity Tolerance Patient tolerated treatment well    Behavior During Therapy [facility] for tasks assessed/performed             Past Medical History:  Diagnosis Date   Blood transfusion without reported diagnosis    Chronic right ear pain    normal MRI   Esophageal stricture    Family history of breast cancer    Family history of colon cancer    Family history of melanoma    Family history of prostate cancer    Fibroids    uterine, history of   GERD (gastroesophageal reflux disease)    History of uterine prolapse    Hx of adenomatous polyp of colon 10/19/2014   Hyperlipidemia    Obesity    PVC (premature ventricular contraction)    first dx by holter in 1980's, echo (6/10) EF 65-46%, normal diastolic fxn, normal size RV and fxn, mild MR, PASP 37mHg   Tubal pregnancy    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 CSilvano Rusk  ECTOPIC PREGNANCY SURGERY  1980   ESOPHAGOGASTRODUODENOSCOPY (EGD) WITH ESOPHAGEAL DILATION  04/03/12   Dr. CSilvano Rusk  PARTIAL HYSTERECTOMY     abdominal, fibroids, and prolapse (1998) bladder tack   UPPER GASTROINTESTINAL ENDOSCOPY      There were no vitals filed for this visit.   Subjective Assessment - 10/18/20 0940     Subjective The pessary is still in the vaginal canal. It is not on the tender spot.    Patient Stated Goals strengthen pelvic floor; manage prolapse    Currently in Pain? No/denies                            Pelvic Floor Special Questions - 10/18/20 0001     Biofeedback able to contract to 5 uv in sidely  and supine but had trouble feeling the contraction; at times would bulge the abdomen; used a ball squeeze to help facilitate the pelvic floor.    Biofeedback sensor type Surface   vaginal              [facility] Adult PT Treatment/Exercise - 10/18/20 0001  Self-Care   Self-Care Other Self-Care Comments    Other Self-Care Comments  educated patient on the pelvic floor, the vaginal anatomy, what a cyctocele is.      Lumbar Exercises: Seated   Other Seated Lumbar Exercises sitting pelvic floor contraction with ball squeeze      Lumbar Exercises: Supine   Ab Set 20 reps;2 seconds    AB Set Limitations with pelvic floor contraction and ball squeeze, tactile cues to engage the lower abdomen.    Isometric Hip Flexion 10 reps;5 seconds    Isometric Hip Flexion Limitations with pelvic floro contraction                     PT Education - 10/18/20 1026     Education Details Access Code: [email or web address]    Person(s) Educated Patient    Methods Explanation;Demonstration;Handout    Comprehension Verbalized understanding;Returned demonstration              PT Short Term Goals - 10/13/20 1710       PT SHORT TERM GOAL #1   Title  Independent with initial HEP    Time 4    Period Weeks    Status New    Target Date 11/10/20      PT SHORT TERM GOAL #2   Title able to contract the abdominals without bulging out    Time 4    Period Weeks    Status New    Target Date 11/10/20      PT SHORT TERM GOAL #3   Title able to contract the pelvic floor for 3 seconds without holding her breath    Time 4    Period Weeks    Status New    Target Date 11/10/20               PT Long Term Goals - 10/13/20 1711       PT LONG TERM GOAL #1   Title Independent with a HEP.    Time 12    Period Weeks    Status New    Target Date 01/05/21      PT LONG TERM GOAL #2   Title able to hold her urine as she walks to the bathroom and sit on the commode with minimal to no leakage    Time 12    Period Weeks    Status New    Target Date 01/05/21      PT LONG TERM GOAL #3   Title wears a light incontinence pad due to reduction of her urinary leakage    Time 12    Period Weeks    Status New    Target Date 01/05/21      PT LONG TERM GOAL #4   Title pelvic floor strength >/= 3/5 and holding for 10 seconds to be able to walk to bend or move with a full bladder with minimal to no urinary leakage    Time 12    Period Weeks    Status New    Target Date 01/05/21                   Plan - 10/18/20 1039     Clinical Impression Statement Patient resting tone is 2 uv. She is able to contract the pelvic floor but will bulge the abdomen. Patient was contracting the front pelvic floor muscles more than the vaginal opening. She had more difficultly with  contracting the pelvic floor in  sitting than laying down. The pessary continues to stay in the vaginal opening. She is not having vaginal pain with the pessary anymore. Patient just started so she has not met goals at this time. Patient will benefit from skilled therapy to reduce her urgency and bladder leakage with strengthing and improved pelvic floor coordination.     Personal Factors and Comorbidities Age;Comorbidity 2;Fitness;Past/Current Experience;Time since onset of injury/illness/exacerbation    Comorbidities Ulcerative Collitis; Partial Hysterectomy wiht bladder neck suspension 1998    Examination-Activity Limitations Bend;Continence;Locomotion Level;Toileting    Stability/Clinical Decision Making Evolving/Moderate complexity    Rehab Potential Good    PT Frequency 1x / week    PT Duration 12 weeks    PT Treatment/Interventions ADLs/Self Care Home Management;Biofeedback;Therapeutic activities;Therapeutic exercise;Neuromuscular re-education;Patient/family education;Manual techniques;Dry needling;Taping;Spinal Manipulations    PT Next Visit Plan urge to void, abdominal contraction without bulging, diaphragmatic breathing, hip strenghtening;  hip flexor, hamstring, piriformis stretch; using the pelvic floor EMG; work on vaginal closing and not just the front of the pelvic floor    PT Home Exercise Plan Access Code: [email or web address]    Recommended Other Services MD signed note    Consulted and Agree with Plan of Care Patient             Patient will benefit from skilled therapeutic intervention in order to improve the following deficits and impairments:  Decreased endurance, Decreased activity tolerance, Decreased strength, Increased fascial restricitons, Increased muscle spasms, Decreased coordination  Visit Diagnosis: Muscle weakness (generalized)  Other lack of coordination  Overactive bladder     Problem List Patient Active Problem List   Diagnosis Date Noted   Nonrheumatic mitral valve regurgitation 04/16/2019   Essential hypertension 04/11/2018   PVC's (premature ventricular contractions) 04/11/2018   Class 2 obesity with body mass index (BMI) of 37.0 to 37.9 in adult 12/06/2017   Genetic testing 09/26/2017   Family history of breast cancer    Family history of colon cancer    Family history of prostate cancer    Family history of  melanoma    Family history of Lynch syndrome - son 08/04/2017   Prediabetes 07/20/2015   Estrogen deficiency 04/27/2015   GERD (gastroesophageal reflux disease) 03/23/2015   Hx of adenomatous polyp of colon 10/19/2014   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[place]) 65/68/1275   Pain in joint, pelvic region and thigh 07/24/2014   Need for hepatitis C screening test 07/12/2014   Pelvic pain in female 07/12/2014   Vitamin D deficiency 07/03/2014   Encounter for Medicare annual wellness exam 07/08/2013   Osteoporosis 05/24/2013   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   Detrusor muscle hypertonia 11/15/2010   Adnexal pain 11/15/2010   Urge incontinence 11/15/2010   Family history of coronary artery disease 10/15/2010   Routine general medical examination at a health care facility 06/01/2010   FASCIITIS, PLANTAR 06/06/2009   Hyperlipidemia 06/03/2007   Urge urinary incontinence 06/03/2007   MIGRAINES, HX OF 06/03/2007    Earlie Counts, PT 10/18/20 10:47 AM  [place] at [facility] [address], [place], [place], [address] Phone: [phone]   Fax:  [phone]  Name: Rachel Carlson MRN: [id number] Date of Birth: Jan 02, 1946

## 2020-10-20 DIAGNOSIS — L821 Other seborrheic keratosis: Secondary | ICD-10-CM | POA: Diagnosis not present

## 2020-10-20 DIAGNOSIS — D23121 Other benign neoplasm of skin of left upper eyelid, including canthus: Secondary | ICD-10-CM | POA: Diagnosis not present

## 2020-10-25 ENCOUNTER — Encounter: Payer: Self-pay | Admitting: Physical Therapy

## 2020-10-25 ENCOUNTER — Other Ambulatory Visit: Payer: Self-pay

## 2020-10-25 ENCOUNTER — Encounter: Payer: Medicare Other | Admitting: Physical Therapy

## 2020-10-25 DIAGNOSIS — N3281 Overactive bladder: Secondary | ICD-10-CM

## 2020-10-25 DIAGNOSIS — R278 Other lack of coordination: Secondary | ICD-10-CM

## 2020-10-25 DIAGNOSIS — M6281 Muscle weakness (generalized): Secondary | ICD-10-CM | POA: Diagnosis not present

## 2020-10-25 NOTE — Patient Instructions (Addendum)
Urge Incontinence  Ideal urination frequency is every 2-4 wakeful hours, which equates to 5-8 times within a 24-hour period.   Urge incontinence is leakage that occurs when the bladder muscle contracts, creating a sudden need to go before getting to the bathroom.   Going too often when your bladder isn't actually full can disrupt the body's automatic signals to store and hold urine longer, which will increase urgency/frequency.  In this case, the bladder "is running the show" and strategies can be learned to retrain this pattern.   One should be able to control the first urge to urinate, at around 158m.  The bladder can hold up to a "grande latte," or 4071m To help you gain control, practice the Urge Drill below when urgency strikes.  This drill will help retrain your bladder signals and allow you to store and hold urine longer.  The overall goal is to stretch out your time between voids to reach a more manageable voiding schedule.    Practice your "quick flicks" often throughout the day (each waking hour) even when you don't need feel the urge to go.  This will help strengthen your pelvic floor muscles, making them more effective in controlling leakage.  Urge Drill  When you feel an urge to go, follow these steps to regain control: Stop what you are doing and be still Take one deep breath, directing your air into your abdomen Think an affirming thought, such as "I've got this." Do 5 quick flicks of your pelvic floor Walk with control to the bathroom to void, or delay voiding  Access Code: [email or web address] URL: [email or web address] Date: 10/25/2020 Prepared by: ChEarlie CountsExercises Supine Hip Adduction Isometric with BaDiona Foley 3 x daily - 7 x weekly - 1 sets - 10 reps - 5 sec hold Hooklying Isometric Hip Flexion - 1 x daily - 7 x weekly - 1 sets - 5 reps - 5 sec hold Seated Pelvic Floor Contraction with Isometric Hip Adduction - 2 x daily - 7 x weekly - 1 sets - 5 reps - 3 sec  hold Seated Hamstring Stretch - 1 x daily - 7 x weekly - 1 sets - 2 reps - 30 sec hold Seated Piriformis Stretch - 1 x daily - 7 x weekly - 1 sets - 2 reps - 30 sec hold Standing Gastroc Stretch at Counter - 1 x daily - 7 x weekly - 1 sets - 2 reps - 30 sec hold   ChEarlie CountsPT [facility]Old [facility] [address] StreetSuHoriconNC [address]41: [email or web address]

## 2020-10-25 NOTE — Therapy (Signed)
[facility] at [facility] [address], [place], [place], [address] Phone: [phone]   Fax:  [phone]  Physical Therapy Treatment  Patient Details  Name: Rachel Carlson MRN: [id number] Date of Birth: 12-05-1945 Referring Provider (PT): Dr. Sherlene Shams   Encounter Date: 10/25/2020   PT End of Session - 10/25/20 1145     Visit Number 3    Date for PT Re-Evaluation 01/05/21    Authorization Type [place] - Visit Number 3    Authorization - Number of Visits 10    PT Start Time 4193    PT Stop Time 1130    PT Time Calculation (min) 60 min    Activity Tolerance Patient tolerated treatment well    Behavior During Therapy WFL for tasks assessed/performed             Past Medical History:  Diagnosis Date   Blood transfusion without reported diagnosis    Chronic right ear pain    normal MRI   Esophageal stricture    Family history of breast cancer    Family history of colon cancer    Family history of melanoma    Family history of prostate cancer    Fibroids    uterine, history of   GERD (gastroesophageal reflux disease)    History of uterine prolapse    Hx of adenomatous polyp of colon 10/19/2014   Hyperlipidemia    Obesity    PVC (premature ventricular contraction)    first dx by holter in 1980's, echo (6/10) EF 79-02%, normal diastolic fxn, normal size RV and fxn, mild MR, PASP 8mHg   Tubal pregnancy    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 CSilvano Rusk  ECTOPIC PREGNANCY SURGERY  1980   ESOPHAGOGASTRODUODENOSCOPY (EGD) WITH ESOPHAGEAL DILATION  04/03/12   Dr. CSilvano Rusk  PARTIAL HYSTERECTOMY     abdominal, fibroids, and prolapse (1998) bladder tack   UPPER GASTROINTESTINAL ENDOSCOPY      There were no vitals filed for this visit.   Subjective Assessment - 10/25/20 1030     Subjective The pessary is not in the original position. It is just above the vaginal opening. I can feel the bulge again. I had to go off the Mybetriq. Brought her GI track to a halt. I see Dr. SWannetta Sendernext week. Sometimes gets a cramp in the vaginal vault lkie she may start her period.    Patient Stated Goals strengthen pelvic floor; manage prolapse    Currently in Pain? No/denies                            Pelvic Floor Special Questions - 10/25/20 0001     Pelvic Floor Internal Exam Patient confirms identification and approves PT to assess and treat pelvic floor    Exam Type Vaginal    Strength weak squeeze, no lift  Strength # of seconds 4               [facility] Adult PT Treatment/Exercise - 10/25/20 0001       Self-Care   Self-Care Other Self-Care Comments    Other Self-Care Comments  educated patient on the urge to void drill, how to contract the pelvic floor quickly, how to take her mind off the urge      Neuro Re-ed    Neuro Re-ed Details  pelvic floor contraction with the therapist keeping her index finger into the vaginal canal to feel the contraction      Lumbar Exercises: Stretches   Active Hamstring Stretch Right;Left;1 rep;30 seconds   sitting   Active Hamstring Stretch Limitations also did in supine but leg cramps up when she does it and ddoes not when therapist does it    Piriformis Stretch Right;Left;1 rep;30 seconds   sitting   Piriformis Stretch Limitations therapist assissted in supine because if she does it on her own the  muscles cramp      Lumbar Exercises: Supine   Ab Set 10 reps    AB Set Limitations with pelvic floor contraction and ball squeeze, tactile cues to engage the lower abdomen.hold for 4 sec    Other Supine Lumbar Exercises quick contractions of the pelvic floor and she is able to feel them; contract for 5 seconds 10x                     PT Education - 10/25/20 1137     Education Details Access Code: [email or web address]; urge to void drill    Person(s) Educated Patient    Methods Explanation;Demonstration;Verbal cues;Handout    Comprehension Returned demonstration;Verbalized understanding              PT Short Term Goals - 10/25/20 1035       PT SHORT TERM GOAL #1   Title Independent with initial HEP    Time 4    Period Weeks    Status Achieved    Target Date 11/10/20      PT SHORT TERM GOAL #2   Title able to contract the abdominals without bulging out    Time 4    Period Weeks    Status Achieved    Target Date 11/10/20      PT SHORT TERM GOAL #3   Title able to contract the pelvic floor for 3 seconds without holding her breath    Time 4    Period Weeks    Status Achieved               PT Long Term Goals - 10/13/20 1711       PT LONG TERM GOAL #1   Title Independent with a HEP.    Time 12    Period Weeks    Status New    Target Date 01/05/21      PT LONG TERM GOAL #2   Title able to hold her urine as she walks to the bathroom and sit on the commode with minimal to no leakage    Time 12    Period Weeks    Status New    Target Date 01/05/21      PT LONG TERM GOAL #3   Title wears a light incontinence pad due to reduction of her urinary leakage    Time 12    Period Weeks    Status New    Target Date 01/05/21  PT LONG TERM GOAL #4   Title pelvic floor strength >/= 3/5 and holding for 10 seconds to be able to walk to bend or move with a full bladder with minimal to no urinary leakage    Time 12    Period Weeks    Status New    Target Date  01/05/21                   Plan - 10/25/20 1145     Clinical Impression Statement Patient is able to contract the pelvic floor for 4 seconds 3 times at strength of 2/5. She is able to feel the contraction and lower abdominal contraction. Patient is learning how to delay the urge to void. She goes to the bathroom frequently due to the urge and small amount of urine comes out. Patient has stopped ther coffee and that has helped reduce her urge to void. Patient gets many cramps in her legs. therapist had to try different poisitons for her to stretch without her legs cramping. Patient will benefit from skilled therapy to reduce her urgency and bladder leakage with strengthening and improved pelvic floor coordinaiton.    Personal Factors and Comorbidities Age;Comorbidity 2;Fitness;Past/Current Experience;Time since onset of injury/illness/exacerbation    Comorbidities Ulcerative Collitis; Partial Hysterectomy wiht bladder neck suspension 1998    Examination-Activity Limitations Bend;Continence;Locomotion Level;Toileting    Stability/Clinical Decision Making Evolving/Moderate complexity    Rehab Potential Good    PT Frequency 1x / week    PT Duration 12 weeks    PT Treatment/Interventions ADLs/Self Care Home Management;Biofeedback;Therapeutic activities;Therapeutic exercise;Neuromuscular re-education;Patient/family education;Manual techniques;Dry needling;Taping;Spinal Manipulations    PT Next Visit Plan see how the urge to void is going;  abdominal contraction without bulging, , hip strenghtening;  ; using the pelvic floor EMG;    PT Home Exercise Plan Access Code: [email or web address]    Consulted and Agree with Plan of Care Patient             Patient will benefit from skilled therapeutic intervention in order to improve the following deficits and impairments:  Decreased endurance, Decreased activity tolerance, Decreased strength, Increased fascial restricitons, Increased muscle spasms, Decreased  coordination  Visit Diagnosis: Muscle weakness (generalized)  Other lack of coordination  Overactive bladder     Problem List Patient Active Problem List   Diagnosis Date Noted   Nonrheumatic mitral valve regurgitation 04/16/2019   Essential hypertension 04/11/2018   PVC's (premature ventricular contractions) 04/11/2018   Class 2 obesity with body mass index (BMI) of 37.0 to 37.9 in adult 12/06/2017   Genetic testing 09/26/2017   Family history of breast cancer    Family history of colon cancer    Family history of prostate cancer    Family history of melanoma    Family history of Lynch syndrome - son 08/04/2017   Prediabetes 07/20/2015   Estrogen deficiency 04/27/2015   GERD (gastroesophageal reflux disease) 03/23/2015   Hx of adenomatous polyp of colon 10/19/2014   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[place]) 98/33/8250   Pain in joint, pelvic region and thigh 07/24/2014   Need for hepatitis C screening test 07/12/2014   Pelvic pain in female 07/12/2014   Vitamin D deficiency 07/03/2014   Encounter for Medicare annual wellness exam 07/08/2013   Osteoporosis 05/24/2013   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   Detrusor muscle hypertonia 11/15/2010   Adnexal pain 11/15/2010   Urge incontinence 11/15/2010   Family history of coronary artery disease 10/15/2010   Routine  general medical examination at a health care facility 06/01/2010   FASCIITIS, PLANTAR 06/06/2009   Hyperlipidemia 06/03/2007   Urge urinary incontinence 06/03/2007   MIGRAINES, HX OF 06/03/2007    Earlie Counts, PT 10/25/20 11:53 AM  [place] at [facility] [address] [place], [place], [address] Phone: [phone]   Fax:  [phone]  Name: MARYFER TAUZIN MRN: [id number] Date of Birth: 1946/01/05

## 2020-10-31 NOTE — Progress Notes (Signed)
[facility]   Subjective:     Chief Complaint:  Chief Complaint  Patient presents with   Pessary Check   History of Present Illness: Rachel Carlson is a 75 y.o. female with stage II pelvic organ prolapse and OAB who presents for a pessary check. She is using a size #2 cube pessary. Pessary has felt like it has moved and it is uncomfortable to sit down. She denies vaginal bleeding.  Has been attending pelvic physical therapy. She had to stop the myrbetriq 33m because it caused severe constipation. She did not have this issue with the 223m   Past Medical History: Patient  has a past medical history of Blood transfusion without reported diagnosis, Chronic right ear pain, Esophageal stricture, Family history of breast cancer, Family history of colon cancer, Family history of melanoma, Family history of prostate cancer, Fibroids, GERD (gastroesophageal reflux disease), History of uterine prolapse, adenomatous polyp of colon (10/19/2014), Hyperlipidemia, Obesity, PVC (premature ventricular contraction), Tubal pregnancy, Ulcerative proctosigmoiditis ([facility] and Urge urinary incontinence (06/03/2007).   Past Surgical History: She  has a past surgical history that includes Ectopic pregnancy surgery (1980); Partial hysterectomy; Bladder neck suspension (1998); Esophagogastroduodenoscopy (egd) with esophageal dilation (04/03/12); Upper gastrointestinal endoscopy; Breast biopsy (Left); Colonoscopy (05/18/2011/10/12/14); Cataract extraction (Right, 01/2020); and Cataract extraction w/PHACO (Left, 08/12/2020).   Medications: She has a current medication list which includes the following prescription(s): vitamin c, aspirin, calcium citrate-vitamin d, vitamin d-3, co q-10, cranberry, estradiol, glucosamine-chondroitin, grape seed, lansoprazole, lialda, milk thistle, mirabegron er, omega-3 fatty acids, polyethyl glycol-propyl glycol, rosuvastatin, vitamin b-12, zinc gluconate, and mirabegron er.    Allergies: Patient is allergic to pantoprazole sodium, ranitidine, omeprazole, and sulfasalazine.   Social History: Patient  reports that she has never smoked. She has never used smokeless tobacco. She reports that she does not drink alcohol and does not use drugs.      Objective:    Physical Exam: BP 125/77   Pulse 86   Wt 199 lb (90.3 kg)   LMP 05/29/1996   BMI 36.40 kg/m  Gen: No apparent distress, A&O x 3. Detailed Urogynecologic Evaluation:  Pelvic Exam: Normal external female genitalia; Bartholin's and Skene's glands normal in appearance; urethral meatus normal in appearance, no urethral masses or discharge. The pessary was noted to be in place. It was removed and cleaned. Speculum exam revealed no lesions in the vagina. The pessary was not replaced per patient request.    Assessment/Plan:    Assessment: Rachel Carlson a [age].o. with stage II pelvic organ prolapse and OAB here for a pessary check. She is doing well.  Plan: - Will change Myrbetriq to 2520mecause she did see some improvement on this dose and did not have constipation.  - pessary kept out today and given to the patient- will reassess after physical therapy.  - Continue PT  Return 2 months for follow up  MicJaquita FoldsD  Time Spent: I spent 20 minutes dedicated to the care of this patient on the date of this encounter to include pre-visit review of records, face-to-face time with the patient and post visit documentation and ordering medication/ testing.

## 2020-11-01 ENCOUNTER — Ambulatory Visit: Payer: Medicare Other | Admitting: Internal Medicine

## 2020-11-01 ENCOUNTER — Encounter: Payer: Self-pay | Admitting: Internal Medicine

## 2020-11-01 ENCOUNTER — Ambulatory Visit: Payer: Medicare Other | Admitting: Obstetrics and Gynecology

## 2020-11-01 ENCOUNTER — Encounter: Payer: Self-pay | Admitting: Obstetrics and Gynecology

## 2020-11-01 ENCOUNTER — Other Ambulatory Visit: Payer: Self-pay

## 2020-11-01 VITALS — BP 125/77 | HR 86 | Wt 199.0 lb

## 2020-11-01 VITALS — BP 134/90 | HR 68 | Ht 62.0 in | Wt 199.1 lb

## 2020-11-01 DIAGNOSIS — K5130 Ulcerative (chronic) rectosigmoiditis without complications: Secondary | ICD-10-CM

## 2020-11-01 DIAGNOSIS — N3281 Overactive bladder: Secondary | ICD-10-CM

## 2020-11-01 DIAGNOSIS — N8110 Cystocele, unspecified: Secondary | ICD-10-CM | POA: Diagnosis not present

## 2020-11-01 MED ORDER — MIRABEGRON ER 25 MG PO TB24
25.0000 mg | ORAL_TABLET | Freq: Every day | ORAL | 5 refills | Status: DC
Start: 2020-11-01 — End: 2021-01-03

## 2020-11-01 NOTE — Patient Instructions (Signed)
If you are age 73 or older, your body mass index should be between 23-30. Your Body mass index is 36.42 kg/m. If this is out of the aforementioned range listed, please consider follow up with your Primary Care Provider.  If you are age 56 or younger, your body mass index should be between 19-25. Your Body mass index is 36.42 kg/m. If this is out of the aformentioned range listed, please consider follow up with your Primary Care Provider.   __________________________________________________________  The [facility] providers would like to encourage you to use [facility] to communicate with providers for non-urgent requests or questions.  Due to long hold times on the telephone, sending your provider a message by [facility] may be a faster and more efficient way to get a response.  Please allow 48 business hours for a response.  Please remember that this is for non-urgent requests.   Call us in January for a Feb/March appointment.  I appreciate the opportunity to care for you. Silvano Rusk, MD, [facility]

## 2020-11-01 NOTE — Progress Notes (Signed)
Rachel Carlson 75 y.o. 1945-09-07 [id number]  Assessment & Plan:   Encounter Diagnoses  Name Primary?   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  Continue with pelvic floor physical therapy I think that should be helpful.    Subjective:   Chief Complaint: Follow-up of ulcerative proctosigmoiditis  HPI The patient is here for follow-up of her ulcerative proctosigmoiditis and fecal urgency issues.  She has seen Dr. Wannetta Sender of urogynecology and was placed on Myrbetriq and the 50 mg dose led to a fecal impaction issue she thinks.  Though she has had that problem before.  She disimpacted herself and went down to 25 mg.  She is starting and continuing pelvic floor physical therapy.  She is following up with Dr. Wannetta Sender later today.  She is moving her bowels several times a day formed.  She at least once a day will have a situation where she has urgency but cannot produce a stool.  Or maybe not so much urgency but a sensation of need to defecate.  No bleeding.  No mucus discharge. Allergies  Allergen Reactions   Pantoprazole Sodium Diarrhea    Abdominal pain   Ranitidine Diarrhea    abdominal pain   Omeprazole Diarrhea    Abdominal pain   Sulfasalazine Rash    Fever, chills, headache, muscle pain   Current Meds  Medication Sig   Ascorbic Acid (VITAMIN C) 1000 MG tablet Take 1,000 mg by mouth daily.   aspirin 81 MG tablet Take 81 mg by mouth daily.     calcium citrate-vitamin D (CITRACAL+D) 315-200 MG-UNIT per tablet Take 1 tablet by mouth 2 (two) times daily.   Cholecalciferol (VITAMIN D-3) 5000 UNITS TABS Take 5,000 Units by mouth daily.   Coenzyme Q10 (CO Q-10) 200 MG CAPS Take 200 mg by mouth daily.   CRANBERRY PO Take 650 mg by mouth daily.   estradiol (ESTRACE) 0.1 MG/GM vaginal cream Place 0.5 g vaginally 2 (two)  times a week. Place 0.5g nightly for two weeks then twice a week after   Glucosamine-Chondroitin (GLUCOSAMINE CHONDR COMPLEX PO) Take 2 capsules by mouth daily.    GRAPE SEED EXTRACT PO Take 1 capsule by mouth daily.   lansoprazole (PREVACID) 15 MG capsule Take 1 capsule (15 mg total) by mouth 2 (two) times daily before a meal. (Patient taking differently: Take 15 mg by mouth daily.)   LIALDA 1.2 g EC tablet Take 2 tablets by mouth twice daily. (Patient taking differently: Take 2.4 g by mouth in the morning and at bedtime.)   milk thistle 175 MG tablet Take 175 mg by mouth daily.   Omega-3 Fatty Acids (FISH OIL TRIPLE STRENGTH PO) Take 1 capsule by mouth daily.   Polyethyl Glycol-Propyl Glycol (SYSTANE OP) Place 1 drop into both eyes daily.   rosuvastatin (CRESTOR) 20 MG tablet Take 1 tablet (20 mg total) by mouth at bedtime.   vitamin B-12 (CYANOCOBALAMIN) 1000 MCG tablet Take 1,000 mcg by mouth daily.   zinc gluconate 50 MG tablet Take 50 mg by mouth daily.   Past Medical History:  Diagnosis Date   Blood transfusion without reported diagnosis    Chronic right ear pain    normal MRI   Esophageal stricture    Family history of breast cancer    Family history of colon cancer  Family history of melanoma    Family history of prostate cancer    Fibroids    uterine, history of   GERD (gastroesophageal reflux disease)    History of uterine prolapse    Hx of adenomatous polyp of colon 10/19/2014   Hyperlipidemia    Obesity    PVC (premature ventricular contraction)    first dx by holter in 1980's, echo (6/10) EF 96-28%, normal diastolic fxn, normal size RV and fxn, mild MR, PASP 67mHg   Tubal pregnancy    Ulcer[place]itis ([place]    Urge urinary incontinence 06/03/2007   Qualifier: Diagnosis of  By: BMarcelino ScotCMA, AAuburn Bilberry    Past Surgical History:  Procedure Laterality Date   BLADDER NECK SUSPENSION  1998   BREAST BIOPSY Left    CATARACT EXTRACTION Right 01/2020    CATARACT EXTRACTION W/PHACO Left 08/12/2020   Procedure: CATARACT EXTRACTION PHACO AND INTRAOCULAR LENS PLACEMENT LEFT EYE;  Surgeon: WBaruch Goldmann MD;  Location: [facility];  Service: Ophthalmology;  Laterality: Left;  left CDE=10.71   COLONOSCOPY  05/18/2011/10/12/14   Dr. CSilvano Rusk  ECTOPIC PREGNANCY SURGERY  1980   ESOPHAGOGASTRODUODENOSCOPY (EGD) WITH ESOPHAGEAL DILATION  04/03/12   Dr. 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)   Grown children   Never smoker no alcohol tobacco or drug use   family history includes Arrhythmia in her brother; Barrett's esophagus in her maternal aunt and mother; Breast cancer in her sister; Colon cancer in her cousin and another family member; Colon cancer (age of onset: 352 in her son; Colon cancer (age of onset: 372 in her brother; Colon polyps in her sister; Congestive Heart Failure in her maternal grandmother; Coronary artery disease in her mother; Diabetes in her mother; Diabetes type II in her mother; Ehlers-Danlos syndrome in her daughter; Heart attack (age of onset: 736 in her mother; Melanoma in her maternal uncle; Other in her son; Prostate cancer in her maternal grandfather; Skin cancer in her maternal aunt and mother; Sudden death in her mother; Sudden death (age of onset: 443 in her father; Transient ischemic attack in her mother; Tuberculosis in her paternal grandfather.   Review of Systems As above  Objective:   Physical Exam BP 134/90 (BP Location: Left Arm, Patient Position: Sitting, Cuff Size: Normal)   Pulse 68 Comment: irregular  Ht 5' 2"  (1.575 m)   Wt 199 lb 2 oz (90.3 kg)   LMP 05/29/1996   BMI 36.42 kg/m    Patti JMartinique CMA present.  Rectal - NL anoderm no mass, ? Very small rectocele, nonteder and no mass. Good resting and  voluntary anal tone  Anoscopy - Gr 1 int/ext hemorrhoids w/o sig inflammation and no signs of proctitis

## 2020-11-17 ENCOUNTER — Telehealth: Payer: Self-pay | Admitting: Family Medicine

## 2020-11-17 ENCOUNTER — Encounter: Payer: Medicare Other | Admitting: Physical Therapy

## 2020-11-17 NOTE — Telephone Encounter (Signed)
Patient would like to have a dermatology referral or a name of a good dermatologist that the doctor would recommend.  Call back- [phone]

## 2020-11-18 NOTE — Telephone Encounter (Signed)
If she wants a referral is it for skin cancer screening or something else?   I like the [facility] in [place]

## 2020-11-18 NOTE — Telephone Encounter (Signed)
Called pt back and she didn't want to go to [facility] she didn't have the best experience there. I asked pt want she wanted to be seen for or what she needed the referral for. Pt stated it was for her son who is a pt of Dr. Synthia Innocent. He has a issue on his bottom and she thought he needed to see Derm for. I did advise her that her son should schedule an appt with Dr. Darnell Level to have him check it out. If it's something he can't handle then he will refer pt to Derm then. Mother will get her son to call and make an appt with Dr. Lenon Curt to PCP

## 2020-11-18 NOTE — Telephone Encounter (Signed)
Left VM requesting pt to call the office back 

## 2020-11-22 ENCOUNTER — Encounter: Payer: Medicare Other | Admitting: Physical Therapy

## 2020-11-29 ENCOUNTER — Encounter: Payer: Medicare Other | Attending: Family Medicine | Admitting: Physical Therapy

## 2020-11-29 ENCOUNTER — Other Ambulatory Visit: Payer: Self-pay

## 2020-11-29 ENCOUNTER — Encounter: Payer: Self-pay | Admitting: Physical Therapy

## 2020-11-29 ENCOUNTER — Encounter: Payer: Medicare Other | Admitting: Physical Therapy

## 2020-11-29 DIAGNOSIS — R278 Other lack of coordination: Secondary | ICD-10-CM | POA: Diagnosis not present

## 2020-11-29 DIAGNOSIS — M6281 Muscle weakness (generalized): Secondary | ICD-10-CM | POA: Insufficient documentation

## 2020-11-29 DIAGNOSIS — N3281 Overactive bladder: Secondary | ICD-10-CM | POA: Diagnosis not present

## 2020-11-29 NOTE — Therapy (Signed)
[facility] [address] [place], [place], [address] Phone: [phone]   Fax:  [phone]  Physical Therapy Treatment  Patient Details  Name: Rachel Carlson MRN: [id number] Date of Birth: 12-05-1945 Referring Provider (PT): Dr. Sherlene Shams   Encounter Date: 11/29/2020   PT End of Session - 11/29/20 1046     Visit Number 4    Date for PT Re-Evaluation 01/05/21    Authorization Type [place] - Visit Number 4    Authorization - Number of Visits 10    PT Start Time 0017    PT Stop Time 1120    PT Time Calculation (min) 50 min    Activity Tolerance Patient tolerated treatment well    Behavior During Therapy WFL for tasks assessed/performed             Past Medical History:  Diagnosis Date   Blood transfusion without reported diagnosis    Chronic right ear pain    normal MRI   Esophageal stricture    Family history of breast cancer    Family history of colon cancer    Family history of melanoma    Family history of prostate cancer    Fibroids    uterine, history of   GERD (gastroesophageal reflux disease)    History of uterine prolapse    Hx of adenomatous polyp of colon 10/19/2014   Hyperlipidemia    Obesity    PVC (premature ventricular contraction)    first dx by holter in 1980's, echo (6/10) EF 49-44%, normal diastolic fxn, normal size RV and fxn, mild MR, PASP 75mHg   Tubal pregnancy    Ulcerative [place]igmoiditis ([place]    Urge urinary incontinence 06/03/2007   Qualifier: Diagnosis of  By: BMarcelino ScotCMA, AAuburn Bilberry     Past Surgical History:  Procedure Laterality Date   BLADDER NECK SUSPENSION  1998   BREAST BIOPSY Left    CATARACT EXTRACTION Right 01/2020   CATARACT EXTRACTION W/PHACO Left 08/12/2020   Procedure: CATARACT EXTRACTION PHACO AND INTRAOCULAR LENS PLACEMENT LEFT EYE;  Surgeon: WBaruch Goldmann MD;  Location: [facility];  Service: Ophthalmology;   Laterality: Left;  left CDE=10.71   COLONOSCOPY  05/18/2011/10/12/14   Dr. CSilvano Rusk  ECTOPIC PREGNANCY SURGERY  1980   ESOPHAGOGASTRODUODENOSCOPY (EGD) WITH ESOPHAGEAL DILATION  04/03/12   Dr. CSilvano Rusk  PARTIAL HYSTERECTOMY     abdominal, fibroids, and prolapse (1998) bladder tack   UPPER GASTROINTESTINAL ENDOSCOPY      There were no vitals filed for this visit.   Subjective Assessment - 11/29/20 1035     Subjective I was not able to do my exercises due to falling and bruised her ribs and they hurt. Stepped onto mud that clinged onto her shoes and not able to move her feet. Some days are better than others. I am getting the hang of the quick flicks. I have not started the Mybetriq yet due to getting bowel movements more regular. I need to have a bowel movement daily.  Vaginal burning is 85% better. The Estradial cream helps the most. The pessary was not helping.    Patient Stated Goals strengthen pelvic floor; manage prolapse    Currently in Pain? No/denies                            Pelvic Floor Special Questions - 11/29/20 0001  Pelvic Floor Internal Exam Patient confirms identification and approves PT to assess and treat pelvic floor    Exam Type Vaginal    Strength fair squeeze, definite lift   weak lift and needs several trials to perform and VC to not lift chest              [facility] Adult PT Treatment/Exercise - 11/29/20 0001       Self-Care   Self-Care Other Self-Care Comments    Other Self-Care Comments  discussed with patien ton continuing using the coconut oil daily and estrogen cream two times per week for the health of her tissue.      Neuro Re-ed    Neuro Re-ed Details  pelvic floor contraction reclined with tactile cues to he pelvic floor and verbal cues to not lift her chest; patient qwas able to do her best pelvic floor contraction and feel the contraction      Manual Therapy   Manual Therapy Internal Pelvic Floor;Myofascial  release    Myofascial Release fascial release on the urogenital triangle going through the different planes of restrictions; one finger on the urethra with outside finger distracting the tissue and elongating through the restrictions    Internal Pelvic Floor manual work on the levator ani wiht one finger internal and external; release on the sides of the urethra                     PT Education - 11/29/20 1115     Education Details Access Code: [email or web address]    Person(s) Educated Patient    Methods Explanation;Demonstration;Verbal cues;Handout    Comprehension Returned demonstration;Verbalized understanding              PT Short Term Goals - 10/25/20 1035       PT SHORT TERM GOAL #1   Title Independent with initial HEP    Time 4    Period Weeks    Status Achieved    Target Date 11/10/20      PT SHORT TERM GOAL #2   Title able to contract the abdominals without bulging out    Time 4    Period Weeks    Status Achieved    Target Date 11/10/20      PT SHORT TERM GOAL #3   Title able to contract the pelvic floor for 3 seconds without holding her breath    Time 4    Period Weeks    Status Achieved               PT Long Term Goals - 11/29/20 1124       PT LONG TERM GOAL #1   Title Independent with a HEP.    Time 12    Period Weeks    Status On-going      PT LONG TERM GOAL #2   Title able to hold her urine as she walks to the bathroom and sit on the commode with minimal to no leakage    Time 12    Period Weeks    Status On-going      PT LONG TERM GOAL #3   Title wears a light incontinence pad due to reduction of her urinary leakage    Time 12    Period Weeks    Status On-going      PT LONG TERM GOAL #4   Title pelvic floor strength >/= 3/5 and holding for 10 seconds to be able to walk to bend or  move with a full bladder with minimal to no urinary leakage    Baseline 3/5 with smalllift and hold for 4 seconds    Time 12    Period Weeks    Status  On-going                   Plan - 11/29/20 1120     Clinical Impression Statement Patient vaginal burning has decreased by 80%. She is using the estrogen cream and was advised to continue the coconut oil daily for tissue health. Patient was able to contract her pelvic floor with a very small lift for the first time in treatment. Patient was able to feel the lift for the first time so we did not do pelvic EMG. Patient did not bear down when she laughed for the first time in treatment. She had some fascial restricions in the perineal membrane, and levator ani and around the urethra. Patient will benefit from skilled therapy to reduce her urgency and bladder leakage with strengthening to improve pelvic floor coordination.    Personal Factors and Comorbidities Age;Comorbidity 2;Fitness;Past/Current Experience;Time since onset of injury/illness/exacerbation    Comorbidities Ulcerative Collitis; Partial Hysterectomy wiht bladder neck suspension 1998    Examination-Activity Limitations Bend;Continence;Locomotion Level;Toileting    Stability/Clinical Decision Making Evolving/Moderate complexity    Rehab Potential Good    PT Frequency 1x / week    PT Duration 12 weeks    PT Treatment/Interventions ADLs/Self Care Home Management;Biofeedback;Therapeutic activities;Therapeutic exercise;Neuromuscular re-education;Patient/family education;Manual techniques;Dry needling;Taping;Spinal Manipulations    PT Next Visit Plan see how the urge to void is going;  abdominal contraction without bulging, , hip strenghtening;   using the pelvic floor EMG;    PT Home Exercise Plan Access Code: [email or web address]    Consulted and Agree with Plan of Care Patient             Patient will benefit from skilled therapeutic intervention in order to improve the following deficits and impairments:  Decreased endurance, Decreased activity tolerance, Decreased strength, Increased fascial restricitons, Increased muscle spasms,  Decreased coordination  Visit Diagnosis: Muscle weakness (generalized)  Other lack of coordination  Overactive bladder     Problem List Patient Active Problem List   Diagnosis Date Noted   Nonrheumatic mitral valve regurgitation 04/16/2019   Essential hypertension 04/11/2018   PVC's (premature ventricular contractions) 04/11/2018   Class 2 obesity with body mass index (BMI) of 37.0 to 37.9 in adult 12/06/2017   Genetic testing 09/26/2017   Family history of breast cancer    Family history of colon cancer    Family history of prostate cancer    Family history of melanoma    Family history of Lynch syndrome - son 08/04/2017   Prediabetes 07/20/2015   Estrogen deficiency 04/27/2015   GERD (gastroesophageal reflux disease) 03/23/2015   Hx of adenomatous polyp of colon 10/19/2014   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[place]) 65/99/3570   Pain in joint, pelvic region and thigh 07/24/2014   Need for hepatitis C screening test 07/12/2014   Pelvic pain in female 07/12/2014   Vitamin D deficiency 07/03/2014   Encounter for Medicare annual wellness exam 07/08/2013   Osteoporosis 05/24/2013   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

## 2020-11-29 NOTE — Patient Instructions (Signed)
Access Code: [email or web address] URL: [email or web address] Date: 11/29/2020 Prepared by: Earlie Counts  Exercises Supine Hip Adduction Isometric with Diona Foley - 3 x daily - 7 x weekly - 1 sets - 10 reps - 5 sec hold Hooklying Isometric Hip Flexion - 1 x daily - 7 x weekly - 1 sets - 5 reps - 5 sec hold Seated Pelvic Floor Contraction with Isometric Hip Adduction - 2 x daily - 7 x weekly - 1 sets - 5 reps - 3 sec hold Seated Hamstring Stretch - 1 x daily - 7 x weekly - 1 sets - 2 reps - 30 sec hold Seated Piriformis Stretch - 1 x daily - 7 x weekly - 1 sets - 2 reps - 30 sec hold Standing Gastroc Stretch at Counter - 1 x daily - 7 x weekly - 1 sets - 2 reps - 30 sec hold Supine Pelvic Floor Contraction - 4 x daily - 7 x weekly - 1 sets - 5 reps - 5 sec hold  Earlie Counts, PT [facility] [address], [place], [place] [address] W: [phone] [email or web address]

## 2020-12-13 ENCOUNTER — Other Ambulatory Visit: Payer: Self-pay

## 2020-12-13 ENCOUNTER — Encounter: Payer: Medicare Other | Admitting: Physical Therapy

## 2020-12-13 ENCOUNTER — Encounter: Payer: Self-pay | Admitting: Physical Therapy

## 2020-12-13 DIAGNOSIS — R278 Other lack of coordination: Secondary | ICD-10-CM | POA: Diagnosis not present

## 2020-12-13 DIAGNOSIS — N3281 Overactive bladder: Secondary | ICD-10-CM | POA: Diagnosis not present

## 2020-12-13 DIAGNOSIS — M6281 Muscle weakness (generalized): Secondary | ICD-10-CM | POA: Diagnosis not present

## 2020-12-13 NOTE — Therapy (Addendum)
[facility] at [facility] [address] [place], [place], [address] Phone: [phone]   Fax:  [phone]  Physical Therapy Treatment  Patient Details  Name: Rachel Carlson MRN: [id number] Date of Birth: Jun 15, 1945 Referring Provider (PT): Dr. Sherlene Shams   Encounter Date: 12/13/2020   PT End of Session - 12/13/20 1013     Visit Number 5    Date for PT Re-Evaluation 01/05/21    Authorization Type [place] - Visit Number 5    Authorization - Number of Visits 10    PT Start Time 0930    PT Stop Time 8242    PT Time Calculation (min) 45 min    Activity Tolerance Patient tolerated treatment well    Behavior During Therapy [facility] for tasks assessed/performed             Past Medical History:  Diagnosis Date   Blood transfusion without reported diagnosis    Chronic right ear pain    normal MRI   Esophageal stricture    Family history of breast cancer    Family history of colon cancer    Family history of melanoma    Family history of prostate cancer    Fibroids    uterine, history of   GERD (gastroesophageal reflux disease)    History of uterine prolapse    Hx of adenomatous polyp of colon 10/19/2014   Hyperlipidemia    Obesity    PVC (premature ventricular contraction)    first dx by holter in 1980's, echo (6/10) EF 35-36%, normal diastolic fxn, normal size RV and fxn, mild MR, PASP 19mHg   Tubal pregnancy    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 CSilvano Rusk  ECTOPIC PREGNANCY SURGERY  1980   ESOPHAGOGASTRODUODENOSCOPY (EGD) WITH ESOPHAGEAL DILATION  04/03/12   Dr. CSilvano Rusk  PARTIAL HYSTERECTOMY     abdominal, fibroids, and prolapse (1998) bladder tack   UPPER GASTROINTESTINAL ENDOSCOPY      There were no vitals filed for this visit.   Subjective Assessment - 12/13/20 0935     Subjective I have been able to do the exercises 2 times per day. 2 days per week I lose control with the urine. I go tot the bathroom before I leave anywhere.    Patient Stated Goals strengthen pelvic floor; manage prolapse    Currently in Pain? No/denies    Multiple Pain Sites No                [facility] PT Assessment - 12/13/20 0001       Assessment   Medical Diagnosis N32.81 Overactive bladder    Referring Provider (PT) Dr. MSherlene Shams   Onset Date/Surgical Date --   chronic   Prior Therapy using pessary      Precautions   Precautions None      Restrictions   Weight Bearing Restrictions No  [place] residence      Prior Function   Level of Independence Independent    Leisure takes care of husband to get out of a chair, help with getting off the floor due to husband a fall risk;   not on walking program due to plantar fascitis     Cognition   Overall Cognitive Status Within Functional Limits for tasks assessed                           [facility] Adult PT Treatment/Exercise - 12/13/20 0001       Self-Care   Self-Care Other Self-Care Comments    Other Self-Care Comments  discussed with on using coconut or viteamin e for the vulvar area to assist with the health of the tissue.; Educated patient on how to reset her prolapse in the middle of the day laying on her back for 10 minutes and can do pelvic floor ocntraction. Discussed with patient to go every 2 hours for timed voiding      Lumbar  Exercises: Supine   Ab Set 20 reps;5 seconds    AB Set Limitations with legs up on stool and pillow under hips to reset the prolapse and contract the pelvic floor    Bent Knee Raise 10 reps;1 second    Bent Knee Raise Limitations small marches with breath    Bridge with [employer] Other (comment)    Bridge with [employer] Limitations 1 time due to crqm pin hamstring    Other Supine Lumbar Exercises hookly hamstring isometric contraction with ball between knees, contracting the pelvic floor and lower abdominals without a hamsting cramp 10x hold 5 seconds                     PT Education - 12/13/20 1012     Education Details Access Code: [email or web address]; education on the vaginal moisturizers, voiding every 2 hours.    Person(s) Educated Patient    Methods Explanation;Demonstration;Verbal cues;Handout    Comprehension Returned demonstration;Verbalized understanding              PT Short Term Goals - 10/25/20 1035       PT SHORT TERM GOAL #1   Title Independent with initial HEP    Time 4    Period Weeks    Status Achieved    Target Date 11/10/20      PT SHORT TERM GOAL #2   Title able to contract the abdominals without bulging out    Time 4    Period Weeks    Status Achieved    Target Date 11/10/20      PT SHORT TERM GOAL #3   Title able to contract the pelvic floor for 3 seconds without holding her breath    Time 4    Period Weeks    Status Achieved               PT Long Term Goals - 12/13/20 1024       PT LONG TERM GOAL #1   Title Independent with a HEP.    Time 12    Period Weeks    Status On-going      PT LONG TERM GOAL #2   Title able to hold her urine as she walks to the bathroom and sit on the commode with minimal to no leakage    Time 12    Period Weeks  Status On-going      PT LONG TERM GOAL #3   Title wears a light incontinence pad due to reduction of her urinary leakage    Time 12    Period Weeks    Status On-going      PT  LONG TERM GOAL #4   Title pelvic floor strength >/= 3/5 and holding for 10 seconds to be able to walk to bend or move with a full bladder with minimal to no urinary leakage    Baseline 3/5 with smalllift and hold for 4 seconds    Time 12    Period Weeks    Status On-going                   Plan - 12/13/20 1019     Clinical Impression Statement Patient is not leaking urine as frequenty. She will have a big gush of urine 2 times per week. Patient was educated on voiding every 2 hours to educated the bladder for timed voiding. Patient is able to contract her abdominals instead of bulging them with pelvic floor contraction. Patient gets cramps in her legs so her exercises need to be adjusted. Patient is now able to feel the pelvic floor contraction. She will need to go to the bathroom when she turns over in bed. Patient has learned how to reset her prolapse 1 time daily in supine with legs on a couch. Paitent will benefit from skilled therapy to reduce her urgency and bladder leakage with strengthening to improve pelvic floor coordinaiton.    Personal Factors and Comorbidities Age;Comorbidity 2;Fitness;Past/Current Experience;Time since onset of injury/illness/exacerbation    Comorbidities Ulcerative Collitis; Partial Hysterectomy wiht bladder neck suspension 1998    Examination-Activity Limitations Bend;Continence;Locomotion Level;Toileting    Stability/Clinical Decision Making Evolving/Moderate complexity    Rehab Potential Good    PT Frequency 1x / week    PT Duration 12 weeks    PT Treatment/Interventions ADLs/Self Care Home Management;Biofeedback;Therapeutic activities;Therapeutic exercise;Neuromuscular re-education;Patient/family education;Manual techniques;Dry needling;Taping;Spinal Manipulations    PT Next Visit Plan quadruped pelvic floor contraction; supine shoulder extension with pelvic floor contraction; write a renewal    PT Home Exercise Plan Access Code: [email or web address]     Consulted and Agree with Plan of Care Patient             Patient will benefit from skilled therapeutic intervention in order to improve the following deficits and impairments:  Decreased endurance, Decreased activity tolerance, Decreased strength, Increased fascial restricitons, Increased muscle spasms, Decreased coordination  Visit Diagnosis: Muscle weakness (generalized)  Other lack of coordination  Overactive bladder     Problem List Patient Active Problem List   Diagnosis Date Noted   Nonrheumatic mitral valve regurgitation 04/16/2019   Essential hypertension 04/11/2018   PVC's (premature ventricular contractions) 04/11/2018   Class 2 obesity with body mass index (BMI) of 37.0 to 37.9 in adult 12/06/2017   Genetic testing 09/26/2017   Family history of breast cancer    Family history of colon cancer    Family history of prostate cancer    Family history of melanoma    Family history of Lynch syndrome - son 08/04/2017   Prediabetes 07/20/2015   Estrogen deficiency 04/27/2015   GERD (gastroesophageal reflux disease) 03/23/2015   Hx of adenomatous polyp of colon 10/19/2014   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[place]) 83/66/2947   Pain in joint, pelvic region and thigh 07/24/2014   Need for hepatitis C screening test 07/12/2014   Pelvic pain in  female 07/12/2014   Vitamin D deficiency 07/03/2014   Encounter for Medicare annual wellness exam 07/08/2013   Osteoporosis 05/24/2013   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   Detrusor muscle hypertonia 11/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   Adnexal pain 11/15/2010   Urge incontinence 11/15/2010   Family history of coronary artery disease 10/15/2010   Routine general medical examination at a health care facility 06/01/2010   FASCIITIS, PLANTAR 06/06/2009   Hyperlipidemia 06/03/2007   Urge urinary incontinence 06/03/2007   MIGRAINES, HX OF 06/03/2007    Earlie Counts, PT 12/13/20 10:26 AM  [place] [facility] [address] [place], [place], [address] Phone: [phone]   Fax:  [phone]  Name: DAJANIQUE ROBLEY MRN: [id number] Date of Birth: 1945/02/15  PHYSICAL THERAPY DISCHARGE SUMMARY  Visits from Start of Care: 5  Current functional level related to goals / functional outcomes: See above. Patient cancelled the remainder of her appointments to see other doctors for her Parkinson's. She did no reschedule anymore.    Remaining deficits: See above.    Education / Equipment: HEP   Patient agrees to discharge. Patient goals were not met. Patient is being discharged due to the patient's request. Thank you for the referral. Earlie Counts, PT 01/06/21 8:08 AM

## 2020-12-13 NOTE — Patient Instructions (Signed)
Access Code: [email or web address] URL: [email or web address] Date: 12/13/2020 Prepared by: Earlie Counts  Program Notes urinate every 2 hours.    Exercises Supine Hip Adduction Isometric with Ball - 3 x daily - 3 x weekly - 1 sets - 10 reps - 5 sec hold Hooklying Isometric Hip Flexion - 1 x daily - 3 x weekly - 1 sets - 5 reps - 5 sec hold Seated Pelvic Floor Contraction with Isometric Hip Adduction - 2 x daily - 7 x weekly - 1 sets - 5 reps - 3 sec hold Seated Hamstring Stretch - 1 x daily - 7 x weekly - 1 sets - 2 reps - 30 sec hold Seated Piriformis Stretch - 1 x daily - 7 x weekly - 1 sets - 2 reps - 30 sec hold Standing Gastroc Stretch at Counter - 1 x daily - 7 x weekly - 1 sets - 2 reps - 30 sec hold Supine Pelvic Floor Contraction - 4 x daily - 7 x weekly - 1 sets - 5 reps - 5 sec hold Supine Bilateral Hamstring Sets - 1 x daily - 3 x weekly - 1 sets - 10 reps - 5 sec hold Supine March - 1 x daily - 3 x weekly - 1 sets - 10 reps Earlie Counts, PT [facility] [address], [place], [place] [address] W: [phone] [email or web address]

## 2020-12-27 ENCOUNTER — Encounter: Payer: Medicare Other | Admitting: Physical Therapy

## 2021-01-02 NOTE — Progress Notes (Signed)
[facility]   Subjective:     Chief Complaint:  Chief Complaint  Patient presents with   Follow-up    Rachel Carlson is a 75 y.o. female here for a f/u on medication. Pt has not started the myrbetriq.    History of Present Illness: Rachel Carlson is a 75 y.o. 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   Still having issues with constipation so has not restarted the Myrbetriq 70m. Tries to urinate every 2-3 hours. Has to wear depends because she will leak large amounts. Thinking about using Miralax, unsure if she could use it every day.   Pelvic floor exercises have been helping. Feels prolapse is uncomfortable but estrogen is helping with burning.   Past Medical History: Patient  has a past medical history of Blood transfusion without reported diagnosis, Chronic right ear pain, Esophageal stricture, Family history of breast cancer, Family history of colon cancer, Family history of melanoma, Family history of prostate cancer, Fibroids, GERD (gastroesophageal reflux disease), History of uterine prolapse, adenomatous polyp of colon (10/19/2014), Hyperlipidemia, Obesity, PVC (premature ventricular contraction), Tubal pregnancy, Ulcerative pro[place]osigmoiditis ([place], and Urge urinary incontinence (06/03/2007).   Past Surgical History: She  has a past surgical history that includes Ectopic pregnancy surgery (1980); Partial hysterectomy; Bladder neck suspension (1998); Esophagogastroduodenoscopy (egd) with esophageal dilation (04/03/12); Upper gastrointestinal endoscopy; Breast biopsy (Left); Colonoscopy (05/18/2011/10/12/14); Cataract extraction (Right, 01/2020); and Cataract extraction w/PHACO (Left, 08/12/2020).   Medications: She has a current medication list which includes the following prescription(s): vibegron, vitamin c, aspirin, calcium citrate-vitamin d, vitamin d-3, co q-10, cranberry, estradiol, glucosamine-chondroitin, grape seed,  lansoprazole, lialda, milk thistle, omega-3 fatty acids, polyethyl glycol-propyl glycol, rosuvastatin, vitamin b-12, and zinc gluconate.   Allergies: Patient is allergic to pantoprazole sodium, ranitidine, omeprazole, and sulfasalazine.   Social History: Patient  reports that she has never smoked. She has never used smokeless tobacco. She reports that she does not drink alcohol and does not use drugs.      Objective:    Physical Exam: BP 128/81   Pulse 72   LMP 05/29/1996  Gen: No apparent distress, A&O x 3.  Detailed Urogynecologic Evaluation:  deferred  Assessment/Plan:    Assessment: Ms. GLashleyis a 75y.o. 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   Plan: - For constipation, start miralax 17g daily.  - For OAB symptoms, will try an alternative medication, Gemtesa 734mdaily. Samples were provided.  - She does not want to try another pessary. Considering surgery and discussed option of anterior repair with sacrospinous ligament fixation. Handouts provided and she will consider this.  - Continue PT  Return 4-6 weeks for follow up  Rachel Carlson  Time Spent: I spent 25 minutes dedicated to the care of this patient on the date of this encounter to include pre-visit review of records, face-to-face time with the patient and post visit documentation and ordering medication/ testing.

## 2021-01-03 ENCOUNTER — Other Ambulatory Visit: Payer: Self-pay

## 2021-01-03 ENCOUNTER — Telehealth: Payer: Self-pay

## 2021-01-03 ENCOUNTER — Encounter: Payer: Self-pay | Admitting: Obstetrics and Gynecology

## 2021-01-03 ENCOUNTER — Ambulatory Visit (INDEPENDENT_AMBULATORY_CARE_PROVIDER_SITE_OTHER): Payer: Medicare Other | Admitting: Obstetrics and Gynecology

## 2021-01-03 VITALS — BP 128/81 | HR 72

## 2021-01-03 DIAGNOSIS — N3281 Overactive bladder: Secondary | ICD-10-CM | POA: Diagnosis not present

## 2021-01-03 MED ORDER — VIBEGRON 75 MG PO TABS: 1.0000 | ORAL_TABLET | Freq: Every day | ORAL | 5 refills | Status: DC

## 2021-01-03 NOTE — Telephone Encounter (Signed)
PA for Gemtesa in 2 weeks. 01/17/2021.

## 2021-01-16 ENCOUNTER — Telehealth: Payer: Self-pay

## 2021-01-16 NOTE — Telephone Encounter (Signed)
error 

## 2021-01-16 NOTE — Telephone Encounter (Signed)
-----   Message from Elita Quick, [place] sent at 01/05/2021 10:10 AM EST ----- Regarding: Med check Pt given samples for Gemtesa 76m 4 boxes (121mosupply on 01/03/2021 Myrbetriq 2570mas tried and failed Call the pt to see if its helping then start PA

## 2021-01-16 NOTE — Telephone Encounter (Addendum)
Rachel Carlson is a 75 y.o. female had been contacted. Pt says she does not know if the Rachel Carlson is helping much because GI flares or issues. Pt said the Rachel Carlson is slowly working. She still has urgency but not as frequent. I told the pt I will sent this message over the DR to see if she should continue the [facility]

## 2021-02-14 ENCOUNTER — Ambulatory Visit: Payer: Medicare Other | Admitting: Obstetrics and Gynecology

## 2021-02-15 ENCOUNTER — Ambulatory Visit: Payer: Medicare Other | Admitting: Obstetrics and Gynecology

## 2021-03-10 ENCOUNTER — Other Ambulatory Visit: Payer: Self-pay

## 2021-03-10 ENCOUNTER — Ambulatory Visit: Payer: Medicare Other | Admitting: Obstetrics and Gynecology

## 2021-03-10 ENCOUNTER — Encounter: Payer: Self-pay | Admitting: Obstetrics and Gynecology

## 2021-03-10 DIAGNOSIS — N3281 Overactive bladder: Secondary | ICD-10-CM

## 2021-03-10 NOTE — Progress Notes (Signed)
[facility]   Subjective:     Chief Complaint:  Chief Complaint  Patient presents with   overactive bladder    History of Present Illness: Rachel Carlson is a 76 y.o. 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   Does not feel that the Gemtesa helped much- still had the urge and leakage on the way to the bathroom. Took for 4 weeks then went back to the Myrbetriq 3m. There is some improvement with urgency when changing position and frequency. Can go up to 2-3 hours. But still leaks on the way to the bathroom.   She is taking Miralax daily which helped some but caused some bloating.   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   Past Surgical History: She  has a past surgical history that includes Ectopic pregnancy surgery (1980); Partial hysterectomy; Bladder neck suspension (1998); Esophagogastroduodenoscopy (egd) with esophageal dilation (04/03/12); Upper gastrointestinal endoscopy; Breast biopsy (Left); Colonoscopy (05/18/2011/10/12/14); Cataract extraction (Right, 01/2020); and Cataract extraction w/PHACO (Left, 08/12/2020).   Medications: She has a current medication list which includes the following prescription(s): vitamin c, aspirin, calcium citrate-vitamin d, vitamin d-3, co q-10, cranberry, estradiol, glucosamine-chondroitin, grape seed, lansoprazole, lialda, milk thistle, omega-3 fatty acids, polyethyl glycol-propyl  glycol, rosuvastatin, vibegron, vitamin b-12, and zinc gluconate.   Allergies: Patient is allergic to pantoprazole sodium, ranitidine, omeprazole, and sulfasalazine.   Social History: Patient  reports that she has never smoked. She has never used smokeless tobacco. She reports that she does not drink alcohol and does not use drugs.      Objective:    Physical Exam:  Gen: No apparent distress, A&O x 3.  Detailed Urogynecologic Evaluation:  deferred  Assessment/Plan:    Assessment: Ms. GMunschis a 76y.o. 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   Plan: - We discussed alternatives to medications for overactive bladder: botox, SNM and PTNS. She is possibly interested in SNM or PTNS but wants to continue medication at this time. Handouts provided for her to read about her options.  - Continue with Myrbetriq 566m - Also considering surgery for prolapse and SUI (anterior repair with sacrospinous ligament fixation, sling) .   Return 6 months or sooner for follow up  MiJaquita FoldsMD  Time Spent: I spent 25 minutes dedicated to the care of this patient on the date of this encounter to include pre-visit review of records, face-to-face time with the patient and post visit documentation and ordering medication/ testing.

## 2021-03-21 ENCOUNTER — Encounter (INDEPENDENT_AMBULATORY_CARE_PROVIDER_SITE_OTHER): Payer: Medicare Other | Admitting: Ophthalmology

## 2021-03-24 ENCOUNTER — Other Ambulatory Visit: Payer: Self-pay

## 2021-03-24 ENCOUNTER — Encounter (INDEPENDENT_AMBULATORY_CARE_PROVIDER_SITE_OTHER): Payer: Medicare Other | Admitting: Ophthalmology

## 2021-03-24 DIAGNOSIS — H26493 Other secondary cataract, bilateral: Secondary | ICD-10-CM | POA: Diagnosis not present

## 2021-03-24 DIAGNOSIS — H35373 Puckering of macula, bilateral: Secondary | ICD-10-CM | POA: Diagnosis not present

## 2021-03-24 DIAGNOSIS — H43813 Vitreous degeneration, bilateral: Secondary | ICD-10-CM

## 2021-04-03 DIAGNOSIS — H02831 Dermatochalasis of right upper eyelid: Secondary | ICD-10-CM | POA: Diagnosis not present

## 2021-04-03 DIAGNOSIS — H43811 Vitreous degeneration, right eye: Secondary | ICD-10-CM | POA: Diagnosis not present

## 2021-04-03 DIAGNOSIS — H524 Presbyopia: Secondary | ICD-10-CM | POA: Diagnosis not present

## 2021-04-03 DIAGNOSIS — H35373 Puckering of macula, bilateral: Secondary | ICD-10-CM | POA: Diagnosis not present

## 2021-04-03 DIAGNOSIS — H26493 Other secondary cataract, bilateral: Secondary | ICD-10-CM | POA: Diagnosis not present

## 2021-04-05 ENCOUNTER — Encounter: Payer: Self-pay | Admitting: Internal Medicine

## 2021-04-05 ENCOUNTER — Ambulatory Visit: Payer: Medicare Other | Admitting: Internal Medicine

## 2021-04-05 VITALS — BP 130/72 | HR 74 | Ht 62.0 in | Wt 204.2 lb

## 2021-04-05 DIAGNOSIS — Z800 Family history of malignant neoplasm of digestive organs: Secondary | ICD-10-CM | POA: Diagnosis not present

## 2021-04-05 DIAGNOSIS — K5130 Ulcerative (chronic) rectosigmoiditis without complications: Secondary | ICD-10-CM | POA: Diagnosis not present

## 2021-04-05 DIAGNOSIS — K219 Gastro-esophageal reflux disease without esophagitis: Secondary | ICD-10-CM

## 2021-04-05 NOTE — Progress Notes (Signed)
? ?Rachel Carlson Carlson y.o. 12/24/1945 [id number] ? ?Assessment & Plan:  ? ?Encounter Diagnoses  ?Name Primary?  ? 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 Gastroesophageal reflux disease, unspecified whether esophagitis present   ? Family history of colon cancer   ? ? ?I think her IBD is under good control.  These bowel movement issues that she described are most likely related to her pelvic organ prolapse, I think.  She will continue to follow-up with urogynecology regarding those issues with the organ prolapse and her overactive bladder. ? ? ?We had some conversation about nutrition and healthier eating today with the idea of moving more towards real foods and reducing sugar and processed carbohydrates. ? ?She will return in 1 year routinely sooner as needed.  Continue mesalamine therapy.  Every 5-year colonoscopy given family history of colon cancer. ? ?Continue PPI (twice daily) for GERD. ? ?CC: Tower, Wynelle Fanny, MD ? ? ?Subjective:  ? ?Chief Complaint: Follow-up of ulcerative proctosigmoiditis ? ?HPI ?The patient is here for follow-up, she was seen in October, at that point I recommended Benefiber for bowel habit issues.  She continued through with pelvic floor physical therapy which helped overactive bladder.  Her bowel movement issues continue to where she has an urge to go but cannot produce a stool at times.  She is considering surgery versus peripheral nerve stimulation or other procedures for her overactive bladder and organ prolapse but has not decided.  Her husband has some health issues that require her attention. ? ?She stopped taking Benefiber she did not think it helped she tried adding MiraLAX and that has made no difference.  She does not have diarrhea she does not have rectal bleeding.  Moves her bowels most days but not every day.  Stools are formed when she can produce 1. ? ?She tried to go down to once a day on 15 mg lansoprazole but think she needs to move  back to twice a day for her reflux. ?Allergies  ?Allergen Reactions  ? Pantoprazole Sodium Diarrhea  ?  Abdominal pain  ? Ranitidine Diarrhea  ?  abdominal pain  ? Omeprazole Diarrhea  ?  Abdominal pain  ? Sulfasalazine Rash  ?  Fever, chills, headache, muscle pain  ? ?Current Meds  ?Medication Sig  ? Ascorbic Acid (VITAMIN C) 1000 MG tablet Take 1,000 mg by mouth daily.  ? aspirin 81 MG tablet Take 81 mg by mouth daily.    ? calcium citrate-vitamin D (CITRACAL+D) 315-200 MG-UNIT per tablet Take 1 tablet by mouth 2 (two) times daily.  ? Cholecalciferol (VITAMIN D-3) 5000 UNITS TABS Take 5,000 Units by mouth daily.  ? Coenzyme Q10 (CO Q-10) 200 MG CAPS Take 200 mg by mouth daily.  ? CRANBERRY PO Take 650 mg by mouth daily.  ? estradiol (ESTRACE) 0.1 MG/GM vaginal cream Place 0.5 g vaginally 2 (two) times a week. Place 0.5g nightly for two weeks then twice a week after  ? Glucosamine-Chondroitin (GLUCOSAMINE CHONDR COMPLEX PO) Take 2 capsules by mouth daily.   ? GRAPE SEED EXTRACT PO Take 1 capsule by mouth daily.  ? lansoprazole (PREVACID) 15 MG capsule Take 1 capsule (15 mg total) by mouth 2 (two) times daily before a meal. (Patient taking differently: Take 15 mg by mouth daily.)  ? LIALDA 1.2 g EC tablet Take 2 tablets by mouth twice daily. (Patient taking differently: Take 2.4 g by mouth in the morning and at bedtime.)  ? milk thistle 175 MG  tablet Take 175 mg by mouth daily.  ? mirabegron ER (MYRBETRIQ) 50 MG TB24 tablet Take 50 mg by mouth daily.  ? Omega-3 Fatty Acids (FISH OIL TRIPLE STRENGTH PO) Take 1 capsule by mouth daily.  ? Polyethyl Glycol-Propyl Glycol (SYSTANE OP) Place 1 drop into both eyes daily.  ? rosuvastatin (CRESTOR) 20 MG tablet Take 1 tablet (20 mg total) by mouth at bedtime.  ? vitamin B-12 (CYANOCOBALAMIN) 1000 MCG tablet Take 1,000 mcg by mouth daily.  ? zinc gluconate 50 MG tablet Take 50 mg by mouth daily.  ? ?Past Medical History:  ?Diagnosis Date  ? Blood transfusion without reported  diagnosis   ? Chronic right ear pain   ? normal MRI  ? Esophageal stricture   ? Family history of breast cancer   ? Family history of colon cancer   ? Family history of melanoma   ? Family history of prostate cancer   ? Fibroids   ? uterine, history of  ? GERD (gastroesophageal reflux disease)   ? History of uterine prolapse   ? Hx of adenomatous polyp of colon 10/19/2014  ? Hyperlipidemia   ? Obesity   ? PVC (premature ventricular contraction)   ? first dx by holter in 1980's, echo (6/10) EF 66-06%, normal diastolic fxn, normal size RV and fxn, mild MR, PASP 56mHg  ? Tubal pregnancy   ? [place]   ? 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 Qualifier: Diagnosis of  By: BMarcelino ScotCMA, AAuburn Bilberry   ? ?Past Surgical History:  ?Procedure Laterality Date  ? BLADDER NECK SUSPENSION  1998  ? BREAST BIOPSY Left   ? CATARACT EXTRACTION Right 01/2020  ? CATARACT EXTRACTION W/PHACO Left 08/12/2020  ? Procedure: CATARACT EXTRACTION PHACO AND INTRAOCULAR LENS PLACEMENT LEFT EYE;  Surgeon: WBaruch Goldmann MD;  Location: [facility];  Service: Ophthalmology;  Laterality: Left;  left ?CDE=10.71  ? COLONOSCOPY  05/18/2011/10/12/14  ? Dr. [place] ? [place] ? ESOPHAGOGASTRODUODENOSCOPY (EGD) WITH ESOPHAGEAL DILATION  04/03/12  ? Dr. CSilvano Rusk ? PARTIAL HYSTERECTOMY    ? abdominal, fibroids, and prolapse (1998) bladder tack  ? UPPER GASTROINTESTINAL ENDOSCOPY    ? ?Social History  ? ?Social History Narrative  ? 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 Grown children  ? Never smoker no alcohol tobacco or drug use  ? ?family history includes Arrhythmia in her brother; Barrett's esophagus in her maternal aunt and mother; Breast cancer in her sister; Colon cancer in her cousin and another family member; Colon cancer (age of onset: 323 in her son; Colon cancer (age of onset: 352 in her brother; Colon polyps in her sister; Congestive Heart Failure in her maternal  grandmother; Coronary artery disease in her mother; Diabetes in her mother; Diabetes type II in her mother; Ehlers-Danlos syndrome in her daughter; Heart attack (age of onset: 763 in her mother; Melanoma in her maternal uncle; Other in her son; Prostate cancer in her maternal grandfather; Skin cancer in her maternal aunt and mother; Sudden death in her mother; Sudden death (age of onset: 434 in her father; Transient ischemic attack in her mother; Tuberculosis in her paternal grandfather. ? ? ?Review of Systems ?As per HPI ? ?Objective:  ? Physical Exam ?BP 130/72   Pulse 74   Ht 5' 2"  (1.575 m)   Wt 204 lb 3.2 oz (92.6 kg)   LMP 05/29/1996   SpO2 97%  BMI 37.35 kg/m?  ? ?22 minutes total time ?

## 2021-04-05 NOTE — Progress Notes (Signed)
? ?Rachel Carlson y.o. 09/17/1945 [id number] ? ?Assessment & Plan:  ? ? ? ? ?Subjective:  ? ?Chief Complaint: ? 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  She is maintained on mesalamine (generic Lialda) 2.4 g twice daily ?Last seen in October 2022, was seeing urogynecology, had been referred to pelvic PT, Benefiber was recommended.  Having fecal urgency with difficulty defecating.  She saw Dr. Wannetta Sender in urogynecology follow-up in February (03/10/2021) regarding her overactive bladder, she is thinking about SNM and or PTNS therapy, continuing with Myrbetriq and considering surgery for prolapse with stress urinary incontinence and anterior repair with sacrospinous ligament fixation sling.  Last colonoscopy December 2021 showed some erythema in the distal colon and rectum but no active IBD. ?Allergies  ?Allergen Reactions  ? Pantoprazole Sodium Diarrhea  ?  Abdominal pain  ? Ranitidine Diarrhea  ?  abdominal pain  ? Omeprazole Diarrhea  ?  Abdominal pain  ? Sulfasalazine Rash  ?  Fever, chills, headache, muscle pain  ? ?Current Meds  ?Medication Sig  ? Ascorbic Acid (VITAMIN C) 1000 MG tablet Take 1,000 mg by mouth daily.  ? aspirin 81 MG tablet Take 81 mg by mouth daily.    ? calcium citrate-vitamin D (CITRACAL+D) 315-200 MG-UNIT per tablet Take 1 tablet by mouth 2 (two) times daily.  ? Cholecalciferol (VITAMIN D-3) 5000 UNITS TABS Take 5,000 Units by mouth daily.  ? Coenzyme Q10 (CO Q-10) 200 MG CAPS Take 200 mg by mouth daily.  ? CRANBERRY PO Take 650 mg by mouth daily.  ? estradiol (ESTRACE) 0.1 MG/GM vaginal cream Place 0.5 g vaginally 2 (two) times a week. Place 0.5g nightly for two weeks then twice a week after  ? Glucosamine-Chondroitin (GLUCOSAMINE CHONDR COMPLEX PO) Take 2 capsules by mouth daily.   ? GRAPE SEED EXTRACT PO Take 1 capsule by mouth daily.  ? lansoprazole (PREVACID) 15 MG capsule Take 1 capsule (15 mg total)  by mouth 2 (two) times daily before a meal. (Patient taking differently: Take 15 mg by mouth daily.)  ? LIALDA 1.2 g EC tablet Take 2 tablets by mouth twice daily. (Patient taking differently: Take 2.4 g by mouth in the morning and at bedtime.)  ? milk thistle 175 MG tablet Take 175 mg by mouth daily.  ? mirabegron ER (MYRBETRIQ) 50 MG TB24 tablet Take 50 mg by mouth daily.  ? Omega-3 Fatty Acids (FISH OIL TRIPLE STRENGTH PO) Take 1 capsule by mouth daily.  ? Polyethyl Glycol-Propyl Glycol (SYSTANE OP) Place 1 drop into both eyes daily.  ? rosuvastatin (CRESTOR) 20 MG tablet Take 1 tablet (20 mg total) by mouth at bedtime.  ? vitamin B-12 (CYANOCOBALAMIN) 1000 MCG tablet Take 1,000 mcg by mouth daily.  ? zinc gluconate 50 MG tablet Take 50 mg by mouth daily.  ? ?Past Medical History:  ?Diagnosis Date  ? Blood transfusion without reported diagnosis   ? Chronic right ear pain   ? normal MRI  ? Esophageal stricture   ? Family history of breast cancer   ? Family history of colon cancer   ? Family history of melanoma   ? Family history of prostate cancer   ? Fibroids   ? uterine, history of  ? GERD (gastroesophageal reflux disease)   ? History of uterine prolapse   ? Hx of adenomatous polyp of colon 10/19/2014  ? Hyperlipidemia   ? Obesity   ? PVC (premature ventricular contraction)   ?  first dx by holter in 1980's, echo (6/10) EF 40-37%, normal diastolic fxn, normal size RV and fxn, mild MR, PASP 79mHg  ? Tubal pregnancy   ? Ul[place]oiditis [place]   ? 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 Qualifier: Diagnosis of  By: BMarcelino ScotCMA, AAuburn Bilberry   ? ?Past Surgical History:  ?Procedure Laterality Date  ? BLADDER NECK SUSPENSION  1998  ? BREAST BIOPSY Left   ? CATARACT EXTRACTION Right 01/2020  ? CATARACT EXTRACTION W/PHACO Left 08/12/2020  ? Procedure: CATARACT EXTRACTION PHACO AND INTRAOCULAR LENS PLACEMENT LEFT EYE;  Surgeon: WBaruch Goldmann MD;  Location: [facility];  Service: Ophthalmology;  Laterality: Left;   left ?CDE=10.71  ? COLONOSCOPY  05/18/2011/10/12/14  ? [place]. [place] ESOPHAGOGASTRODUODENOSCOPY (EGD) WITH ESOPHAGEAL DILATION  04/03/12  ? Dr. CSilvano Rusk ? PARTIAL HYSTERECTOMY    ? abdominal, fibroids, and prolapse (1998) bladder tack  ? UPPER GASTROINTESTINAL ENDOSCOPY    ? ?Social History  ? ?Social History Narrative  ? 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 Grown children  ? Never smoker no alcohol tobacco or drug use  ? ?family history includes Arrhythmia in her brother; Barrett's esophagus in her maternal aunt and mother; Breast cancer in her sister; Colon cancer in her cousin and another family member; Colon cancer (age of onset: 366 in her son; Colon cancer (age of onset: 361 in her brother; Colon polyps in her sister; Congestive Heart Failure in her maternal grandmother; Coronary artery disease in her mother; Diabetes in her mother; Diabetes type II in her mother; Ehlers-Danlos syndrome in her daughter; Heart attack (age of onset: 773 in her mother; Melanoma in her maternal uncle; Other in her son; Prostate cancer in her maternal grandfather; Skin cancer in her maternal aunt and mother; Sudden death in her mother; Sudden death (age of onset: 439 in her father; Transient ischemic attack in her mother; Tuberculosis in her paternal grandfather. ? ? ?Review of Systems ? ? ?Objective:  ? Physical Exam ? ? ?

## 2021-04-05 NOTE — Patient Instructions (Addendum)
Check out the book Stay off My Operating Table by Dr. Tracie Harrier - will tell you how to eat differently to get healthier ? ? ?If you are age 73 or older, your body mass index should be between 23-30. Your Body mass index is 37.35 kg/m?Marland Kitchen If this is out of the aforementioned range listed, please consider follow up with your Primary Care Provider. ? ?If you are age 15 or younger, your body mass index should be between 19-25. Your Body mass index is 37.35 kg/m?Marland Kitchen If this is out of the aformentioned range listed, please consider follow up with your Primary Care Provider.  ? ?________________________________________________________ ? ?The [facility] providers would like to encourage you to use [facility] to communicate with providers for non-urgent requests or questions.  Due to long hold times on the telephone, sending your provider a message by [facility] may be a faster and more efficient way to get a response.  Please allow 48 business hours for a response.  Please remember that this is for non-urgent requests.  ?_______________________________________________________ ? ?I appreciate the opportunity to care for you. ?Silvano Rusk, MD, [facility] ? ?

## 2021-04-19 NOTE — Progress Notes (Signed)
?  ?Cardiology Office Note ? ? ?Date:  04/20/2021  ? ?ID:  Rachel Carlson, DOB 02-01-1945, MRN [id number] ? ?PCP:  Abner Greenspan, MD  ?Cardiologist:   Minus Breeding, MD  ? ? ? ?Chief Complaint  ?Patient presents with  ? PVCs  ? ? ?  ?History of Present Illness: ?Rachel Carlson is a 76 y.o. female who presents for a history of palpitations and PVCs.  She was previously seen by Dr. Aundra Dubin.  She has a family history of sudden cardiac death and CAD.  She had an echo in 2010 which was essentially unremarkable and a Holter that demonstrated PVCs.    She had dyspnea when I saw her.  She had a negative POET (Plain Old Exercise Treadmill) 2018 . Echo in 2019 and there was mild MR.    ? ?Since I last saw her she has had some rare palpitations.  She says they are not associated with caffeine.  She tries to stay active.  She denies any cardiovascular symptoms with her usual activity.  She does not have chest pressure, neck or arm discomfort.  She is not having any new shortness of breath, PND or orthopnea.  She is not having any palpitations, presyncope or syncope.  She has had no weight gain or edema.   ? ? ?Past Medical History:  ?Diagnosis Date  ? Blood transfusion without reported diagnosis   ? Chronic right ear pain   ? normal MRI  ? Esophageal stricture   ? Family history of breast cancer   ? Family history of colon cancer   ? Family history of melanoma   ? Family history of prostate cancer   ? Fibroids   ? uterine, history of  ? GERD (gastroesophageal reflux disease)   ? History of uterine prolapse   ? Hx of adenomatous polyp of colon 10/19/2014  ? Hyperlipidemia   ? Obesity   ? PVC (premature ventricular contraction)   ? first dx by holter in 1980's, echo (6/10) EF 71-95%, normal diastolic fxn, normal size RV and fxn, mild MR, PASP 27mHg  ? Tubal pregnancy   ? Ul[place] ([place]   ? 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 Qualifier: Diagnosis of  By: BMarcelino ScotCMA, AAuburn Bilberry   ? ? ?Past  Surgical History:  ?Procedure Laterality Date  ? BLADDER NECK SUSPENSION  1998  ? BREAST BIOPSY Left   ? CATARACT EXTRACTION Right 01/2020  ? CATARACT EXTRACTION W/PHACO Left 08/12/2020  ? Procedure: CATARACT EXTRACTION PHACO AND INTRAOCULAR LENS PLACEMENT LEFT EYE;  Surgeon: WBaruch Goldmann MD;  Location: [facility];  Service: Ophthalmology;  Laterality: Left;  left ?CDE=10.71  ? COLONOSCOPY  05/18/2011/10/12/14  ? Dr. [place]ilvano [place]usk ? [place] ? ESOPHAGOGASTRODUODENOSCOPY (EGD) WITH ESOPHAGEAL DILATION  04/03/12  ? Dr. CSilvano Rusk ? PARTIAL HYSTERECTOMY    ? abdominal, fibroids, and prolapse (1998) bladder tack  ? UPPER GASTROINTESTINAL ENDOSCOPY    ? ? ? ?Current Outpatient Medications  ?Medication Sig Dispense Refill  ? Ascorbic Acid (VITAMIN C) 1000 MG tablet Take 1,000 mg by mouth daily.    ? aspirin 81 MG tablet Take 81 mg by mouth daily.      ? calcium citrate-vitamin D (CITRACAL+D) 315-200 MG-UNIT per tablet Take 1 tablet by mouth 2 (two) times daily.    ? Cholecalciferol (VITAMIN D-3) 5000 UNITS TABS Take 5,000 Units by mouth daily.    ? Coenzyme Q10 (CO Q-10) 200 MG  CAPS Take 200 mg by mouth daily.    ? CRANBERRY PO Take 650 mg by mouth daily.    ? estradiol (ESTRACE) 0.1 MG/GM vaginal cream Place 0.5 g vaginally 2 (two) times a week. Place 0.5g nightly for two weeks then twice a week after 30 g 11  ? Glucosamine-Chondroitin (GLUCOSAMINE CHONDR COMPLEX PO) Take 2 capsules by mouth daily.     ? GRAPE SEED EXTRACT PO Take 1 capsule by mouth daily.    ? lansoprazole (PREVACID) 15 MG capsule Take 1 capsule (15 mg total) by mouth 2 (two) times daily before a meal. (Patient taking differently: Take 15 mg by mouth daily.)    ? LIALDA 1.2 g EC tablet Take 2 tablets by mouth twice daily. (Patient taking differently: Take 2.4 g by mouth in the morning and at bedtime.) 120 tablet 11  ? milk thistle 175 MG tablet Take 175 mg by mouth daily.    ? mirabegron ER (MYRBETRIQ) 50 MG TB24 tablet Take 50 mg  by mouth daily.    ? Omega-3 Fatty Acids (FISH OIL TRIPLE STRENGTH PO) Take 1 capsule by mouth daily.    ? Polyethyl Glycol-Propyl Glycol (SYSTANE OP) Place 1 drop into both eyes daily.    ? rosuvastatin (CRESTOR) 20 MG tablet Take 1 tablet (20 mg total) by mouth at bedtime. 90 tablet 3  ? vitamin B-12 (CYANOCOBALAMIN) 1000 MCG tablet Take 1,000 mcg by mouth daily.    ? zinc gluconate 50 MG tablet Take 50 mg by mouth daily.    ? ?No current facility-administered medications for this visit.  ? ? ?Allergies:   Pantoprazole sodium, Ranitidine, Omeprazole, and Sulfasalazine ? ?ROS:  Please see the history of present illness.   Otherwise, review of systems are positive for none.   All other systems are reviewed and negative.  ? ? ?PHYSICAL EXAM: ?VS:  BP 120/82   Pulse 74   Ht 5' 2"  (1.575 m)   Wt 201 lb 9.6 oz (91.4 kg)   LMP 05/29/1996   SpO2 95%   BMI 36.87 kg/m?  , BMI Body mass index is 36.87 kg/m?.  ?GENERAL:  Well appearing ?NECK:  No jugular venous distention, waveform within normal limits, carotid upstroke brisk and symmetric, no bruits, no thyromegaly ?LUNGS:  Clear to auscultation bilaterally ?CHEST:  Unremarkable ?HEART:  PMI not displaced or sustained,S1 and S2 within normal limits, no S3, no S4, no clicks, no rubs, no murmurs ?ABD:  Flat, positive bowel sounds normal in frequency in pitch, no bruits, no rebound, no guarding, no midline pulsatile mass, no hepatomegaly, no splenomegaly ?EXT:  2 plus pulses throughout, no edema, no cyanosis no clubbing ? ? ?EKG:  EKG is  acute ordered today. ?The ekg ordered today demonstrates normal sinus rhythm, rate 74, axis within normal limits, intervals within normal limits, no acute ST-T wave changes.  Premature ectopic complexes ? ? ?Recent Labs: ?08/11/2020: ALT 13; BUN 14; Creatinine, Ser 0.64; Hemoglobin 13.3; Platelets 192.0; Potassium 4.9; Sodium 141; TSH 2.53  ? ? ?Lipid Panel ?   ?Component Value Date/Time  ? CHOL 153 08/11/2020 1100  ? TRIG 124.0  08/11/2020 1100  ? HDL 62.00 08/11/2020 1100  ? CHOLHDL 2 08/11/2020 1100  ? VLDL 24.8 08/11/2020 1100  ? [place] 66 08/11/2020 1100  ? LDLDIRECT 257.3 06/04/2008 1005  ? ?  ?Wt Readings from Last 3 Encounters:  ?04/20/21 201 lb 9.6 oz (91.4 kg)  ?04/05/21 204 lb 3.2 oz (92.6 kg)  ?11/01/20 199 lb (  90.3 kg)  ?  ? ? ?Other studies Reviewed: ?Additional studies/ records that were reviewed today include: Labs ?Review of the above records demonstrates:   See elsewhere ? ? ?ASSESSMENT AND PLAN: ? ?DYSLIPIDEMIA:    Her LDL was 66 with an HDL of 62.  She is very concerned because of her family history.  We are going to order a coronary calcium score which will help [place] determine goals of therapy.  ? ?HTN:   Her blood pressure is controlled.  No change in therapy.   ? ?MR:  She had mild MR in 2019.  I will follow-up with an echocardiogram as it has been 4 years. ? ?PVCs:   She has some rare palpitations that are not particularly problematic.  No change in therapy he is not particularly symptomatic.  No change in therapy. ? ? ?Current medicines are reviewed at length with the patient today.  The patient does not have concerns regarding medicines. ? ?The following changes have been made:  None ? ?Labs/ tests ordered today include:    ? ?Orders Placed This Encounter  ?Procedures  ? CT CARDIAC SCORING (SELF PAY ONLY)  ? EKG 12-Lead  ? ECHOCARDIOGRAM COMPLETE  ? ? ? ?Disposition:   FU with me in 12 months.  ? ? ?Signed, ?Minus Breeding, MD  ?04/20/2021 10:19 AM    ?[place] ?

## 2021-04-20 ENCOUNTER — Encounter: Payer: Self-pay | Admitting: Cardiology

## 2021-04-20 ENCOUNTER — Ambulatory Visit: Payer: Medicare Other | Admitting: Cardiology

## 2021-04-20 ENCOUNTER — Other Ambulatory Visit: Payer: Self-pay

## 2021-04-20 VITALS — BP 120/82 | HR 74 | Ht 62.0 in | Wt 201.6 lb

## 2021-04-20 DIAGNOSIS — I340 Nonrheumatic mitral (valve) insufficiency: Secondary | ICD-10-CM | POA: Diagnosis not present

## 2021-04-20 DIAGNOSIS — R0602 Shortness of breath: Secondary | ICD-10-CM

## 2021-04-20 DIAGNOSIS — E785 Hyperlipidemia, unspecified: Secondary | ICD-10-CM

## 2021-04-20 DIAGNOSIS — I493 Ventricular premature depolarization: Secondary | ICD-10-CM

## 2021-04-20 NOTE — Patient Instructions (Signed)
Medication Instructions:  ?Your physician recommends that you continue on your current medications as directed. Please refer to the Current Medication list given to you today. ? ?*If you need a refill on your cardiac medications before your next appointment, please call your pharmacy* ? ?Testing/Procedures: ?CT coronary calcium score.  ? ?Test locations:  ?[facility] ([address] [place], [place] [address]) ?[facility] ([address] [place], [place] [address])  ? ?This is $99 out of pocket. ? ? ?Coronary CalciumScan ?A coronary calcium scan is an imaging test used to look for deposits of calcium and other fatty materials (plaques) in the inner lining of the blood vessels of the heart (coronary arteries). These deposits of calcium and plaques can partly clog and narrow the coronary arteries without producing any symptoms or warning signs. This puts a person at risk for a heart attack. This test can detect these deposits before symptoms develop. ?Tell a health care provider about: ?Any allergies you have. ?All medicines you are taking, including vitamins, herbs, eye drops, creams, and over-the-counter medicines. ?Any problems you or family members have had with anesthetic medicines. ?Any blood disorders you have. ?Any surgeries you have had. ?Any medical conditions you have. ?Whether you are pregnant or may be pregnant. ?What are the risks? ?Generally, this is a safe procedure. However, problems may occur, including: ?Harm to a pregnant woman and her unborn baby. This test involves the use of radiation. Radiation exposure can be dangerous to a pregnant woman and her unborn baby. If you are pregnant, you generally should not have this procedure done. ?Slight increase in the risk of cancer. This is because of the radiation involved in the test. ?What happens before the procedure? ?No preparation is needed for this procedure. ?What happens during the procedure? ?You will undress and remove any  jewelry around your neck or chest. ?You will put on a hospital gown. ?Sticky electrodes will be placed on your chest. The electrodes will be connected to an electrocardiogram (ECG) machine to record a tracing of the electrical activity of your heart. ?A CT scanner will take pictures of your heart. During this time, you will be asked to lie still and hold your breath for 2-3 seconds while a picture of your heart is being taken. ?The procedure may vary among health care providers and hospitals. ?What happens after the procedure? ?You can get dressed. ?You can return to your normal activities. ?It is up to you to get the results of your test. Ask your health care provider, or the department that is doing the test, when your results will be ready. ?Summary ?A coronary calcium scan is an imaging test used to look for deposits of calcium and other fatty materials (plaques) in the inner lining of the blood vessels of the heart (coronary arteries). ?Generally, this is a safe procedure. Tell your health care provider if you are pregnant or may be pregnant. ?No preparation is needed for this procedure. ?A CT scanner will take pictures of your heart. ?You can return to your normal activities after the scan is done. ?This information is not intended to replace advice given to you by your health care provider. Make sure you discuss any questions you have with your health care provider. ?Document Released: 07/14/2007 Document Revised: 12/05/2015 Document Reviewed: 12/05/2015 ?Elsevier Interactive Patient Education ? 2017 [place]. ? ?Your physician has requested that you have an echocardiogram. Echocardiography is a painless test that uses sound waves to create images of your heart. It  provides your doctor with information about the size and shape of your heart and how well your heart?s chambers and valves are working. This procedure takes approximately one hour. There are no restrictions for this procedure. ?This will be  done at our [facility] location:  [address] ? ? ?Follow-Up: ?At [facility], you and your health needs are our priority.  As part of our continuing mission to provide you with exceptional heart care, we have created designated Provider Care Teams.  These Care Teams include your primary Cardiologist (physician) and Advanced Practice Providers (APPs -  Physician Assistants and Nurse Practitioners) who all work together to provide you with the care you need, when you need it. ? ?We recommend signing up for the patient portal called "MyChart".  Sign up information is provided on this After Visit Summary.  MyChart is used to connect with patients for Virtual Visits (Telemedicine).  Patients are able to view lab/test results, encounter notes, upcoming appointments, etc.  Non-urgent messages can be sent to your provider as well.   ?To learn more about what you can do with MyChart, go to [email or web address].   ? ?Your next appointment:   ?12 month(s) ? ?The format for your next appointment:   ?In Person ? ?Provider:   ?Dr. Percival Spanish ?

## 2021-04-27 ENCOUNTER — Ambulatory Visit (HOSPITAL_COMMUNITY): Payer: Medicare Other | Attending: Cardiology

## 2021-04-27 ENCOUNTER — Ambulatory Visit (INDEPENDENT_AMBULATORY_CARE_PROVIDER_SITE_OTHER)
Admission: RE | Admit: 2021-04-27 | Discharge: 2021-04-27 | Disposition: A | Discharge status: Home / Self Care | Payer: Self-pay | Source: Ambulatory Visit | Attending: Cardiology | Admitting: Cardiology

## 2021-04-27 DIAGNOSIS — I340 Nonrheumatic mitral (valve) insufficiency: Secondary | ICD-10-CM | POA: Diagnosis not present

## 2021-04-27 DIAGNOSIS — E785 Hyperlipidemia, unspecified: Secondary | ICD-10-CM

## 2021-04-27 LAB — ECHOCARDIOGRAM COMPLETE
Area-P 1/2: 4.68 cm2
S' Lateral: 2.7 cm

## 2021-05-02 ENCOUNTER — Other Ambulatory Visit: Payer: Self-pay | Admitting: *Deleted

## 2021-05-02 DIAGNOSIS — R0602 Shortness of breath: Secondary | ICD-10-CM

## 2021-05-05 ENCOUNTER — Other Ambulatory Visit: Payer: Self-pay | Admitting: Family Medicine

## 2021-05-05 DIAGNOSIS — Z1231 Encounter for screening mammogram for malignant neoplasm of breast: Secondary | ICD-10-CM

## 2021-05-08 ENCOUNTER — Telehealth (HOSPITAL_COMMUNITY): Payer: Self-pay | Admitting: *Deleted

## 2021-05-08 ENCOUNTER — Telehealth: Payer: Self-pay

## 2021-05-08 DIAGNOSIS — E2839 Other primary ovarian failure: Secondary | ICD-10-CM

## 2021-05-08 DIAGNOSIS — M810 Age-related osteoporosis without current pathological fracture: Secondary | ICD-10-CM

## 2021-05-08 NOTE — Telephone Encounter (Signed)
I put the order in  ? ?She can go ahead and call to schedule  ?

## 2021-05-08 NOTE — Telephone Encounter (Signed)
Pt needs an order sent to [facility] for a DEXA. They are booked out to October and requires order from Dr. Please let pt know when order is placed so she can call and make appt. Can leave message on VM [phone]. ?

## 2021-05-08 NOTE — Telephone Encounter (Signed)
Patient given detailed instructions per Myocardial Perfusion Study Information Sheet for the test on 05/10/21 Patient notified to arrive 15 minutes early and that it is imperative to arrive on time for appointment to keep from having the test rescheduled. ? If you need to cancel or reschedule your appointment, please call the office within 24 hours of your appointment. . Patient verbalized understanding.Rachel Carlson ? ? ?

## 2021-05-09 NOTE — Telephone Encounter (Signed)
Pt notified order is in and she will call directly to schedule appt ?

## 2021-05-10 ENCOUNTER — Ambulatory Visit (HOSPITAL_COMMUNITY): Payer: Medicare Other | Attending: Cardiology

## 2021-05-10 DIAGNOSIS — R0602 Shortness of breath: Secondary | ICD-10-CM | POA: Diagnosis not present

## 2021-05-10 LAB — MYOCARDIAL PERFUSION IMAGING
LV dias vol: 70 mL (ref 46–106)
LV sys vol: 25 mL
Nuc Stress EF: 64 %
Peak HR: 102 {beats}/min
Rest HR: 68 {beats}/min
Rest Nuclear Isotope Dose: 10.8 mCi
SDS: 1
SRS: 0
SSS: 1
ST Depression (mm): 0 mm
Stress Nuclear Isotope Dose: 30.1 mCi
TID: 1.05

## 2021-05-10 MED ORDER — REGADENOSON 0.4 MG/5ML IV SOLN
0.4000 mg | Freq: Once | INTRAVENOUS | Status: AC
  Administered 2021-05-10: 0.4 mg via INTRAVENOUS

## 2021-05-10 MED ORDER — TECHNETIUM TC 99M TETROFOSMIN IV KIT
30.1000 | PACK | Freq: Once | INTRAVENOUS | Status: AC | PRN
  Administered 2021-05-10: 30.1 via INTRAVENOUS
  Filled 2021-05-10: qty 31

## 2021-05-10 MED ORDER — TECHNETIUM TC 99M TETROFOSMIN IV KIT
10.8000 | PACK | Freq: Once | INTRAVENOUS | Status: AC | PRN
  Administered 2021-05-10: 10.8 via INTRAVENOUS
  Filled 2021-05-10: qty 11

## 2021-05-12 ENCOUNTER — Telehealth: Payer: Self-pay | Admitting: Cardiology

## 2021-05-12 NOTE — Telephone Encounter (Signed)
?  Pt is calling would like to speak with a nurse regarding; ? ?They found indefinite findings involving right lung, she has scaring in her right middle lobe and calcified granuloma in her right lower lobe, she wanted to ask Dr. Percival Spanish if she should see a pulmonary doctor for that and if coronary calcium score can perform in any age? ? ?She asked if she can get a call back today ? ? ?

## 2021-05-12 NOTE — Telephone Encounter (Signed)
Pt would like to know if calcium deposition continues to occur in the artery system once starting a Statin drug? Please advise ?

## 2021-05-12 NOTE — Telephone Encounter (Signed)
Patient saw results of CT cardiac scoring in MyChart and would like to see a pulmonologist that Dr. Percival Spanish recommends. She also wanted to schedule her son Lennette Bihari an appointment with Dr. Percival Spanish. Appointment made for 4/27 as new patient for evaluation and for calcium scoring request. ?

## 2021-05-15 ENCOUNTER — Other Ambulatory Visit: Payer: Self-pay

## 2021-05-15 DIAGNOSIS — Z712 Person consulting for explanation of examination or test findings: Secondary | ICD-10-CM

## 2021-05-15 NOTE — Telephone Encounter (Signed)
Per patient request, CT cardiac scoring and myocardial perfusion imaging results mailed to her. ?

## 2021-05-15 NOTE — Telephone Encounter (Signed)
Paitent informed that Ct scoring and perfusion results mailed to her, as well as referral to [facility] pulmonology. ?

## 2021-05-15 NOTE — Telephone Encounter (Signed)
Gave patient response from Dr. Percival Spanish.Marland KitchenMarland Kitchen"Yes.  Calcium can still build up.  However, plaque grows slower and is less likely to cause a heart attack if you are on statin." ?Patient concerned over results from calcium scoring and wants to be referred to a pulmonologist that Dr. Lolly Mustache recommends. ?    Mediastinum/Nodes: Calcified right infrahilar nodes are likely ?    related to old granulomatous disease. ?    Moderate hiatal hernia. ?    Lungs/Pleura: No pleural fluid. Scarring in the right middle lobe. ?    Right lower lobe calcified granuloma. ?Please advise. ?

## 2021-06-05 ENCOUNTER — Other Ambulatory Visit: Payer: Self-pay | Admitting: Internal Medicine

## 2021-06-06 ENCOUNTER — Ambulatory Visit: Payer: Medicare Other | Admitting: Pulmonary Disease

## 2021-06-06 ENCOUNTER — Encounter: Payer: Self-pay | Admitting: Pulmonary Disease

## 2021-06-06 VITALS — BP 120/68 | HR 77 | Temp 98.4°F | Ht 62.0 in | Wt 194.0 lb

## 2021-06-06 DIAGNOSIS — J8410 Pulmonary fibrosis, unspecified: Secondary | ICD-10-CM

## 2021-06-06 NOTE — Patient Instructions (Signed)
We will get a CT of the chest in March 2024 to mainly keep an eye on the mild fibrosis in the right middle lobe, we will also monitor th calcified granuloma and if any other spots pop up.  ? ?Plan to meet after the CT in March 2024 in the office to discuss results ? ?Return to clinic in 10 months or sooner if symptoms arise or worsen ?

## 2021-06-06 NOTE — Progress Notes (Signed)
? ?@Patient  ID: Rachel Carlson, female    DOB: 1945/05/08, 76 y.o.   MRN: [id number] ? ?Chief Complaint  ?Patient presents with  ? Consult  ?  Pt states she is here for a calcified granuloma in lungs based on a CT scan in March 2023. Pt states that the only issue she has is a cough that has been occurring for a few years, non productive.   ? ? ?Referring provider: ?Tower, Wynelle Fanny, MD ? ?HPI:  ? ?76 y.o. woman whom are seen in consultation for evaluation of abnormal CT findings.  Most recent cardiology note reviewed.  Most recent PCP note reviewed. ? ?Patient is doing well.  Denies any dyspnea.  No concerning respiratory symptoms.  She had CT coronary scoring scan recently.  Largely due to significant family history of MI in father in 75s and mother in her 36s.  This demonstrated calcified granuloma right upper lobe and scattered calcified lymph nodes in the chest with notably locally right hilar node near granuloma as well as faint groundglass scattered opacities peripherally right middle lobe on my review and interpretation. ? ?She has chronic cough.  Dry.  No significant seasonal or environmental allergy symptoms.  Cough worse when breathing deeply in.  Brief cough with deep breath.  Severity and frequency of cough varies day-to-day.  She has hiatal hernia on CT scan that was reviewed.  She has reflux symptoms.  Usually well controlled with PPI.  Occasionally takes twice daily when eating acidic foods. ? 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 ? ?Licensed conveyancer / Pulmonary Flowsheets:  ? ?ACT:  ?   ? View : No data to display.  ?  ?  ?  ? ? ?MMRC: ?   ? View : No data to display.  ?  ?  ?  ? ? ?Epworth:  ?   ? View : No data to display.  ?  ?  ?  ? ? ?Tests:  ? ?FENO:  ?No results found for: NITRICOXIDE ? ?PFT: ?   ? View  : No data to display.  ?  ?  ?  ? ? ?WALK:  ?   ? View : No data to display.  ?  ?  ?  ? ? ?Imaging: ?Personally reviewed and as per EMR discussion this note ?MYOCARDIAL PERFUSION IMAGING ? ?Result Date: 05/10/2021 ?  The study is normal. The study is low risk.   No ST deviation was noted.   Left ventricular function is normal. Nuclear stress EF: 64 %. The left ventricular ejection fraction is normal (55-65%). End diastolic cavity size is normal.   Prior study not available for comparison.   ? ?Lab Results: ?Personally reviewed ?CBC ?   ?Component Value Date/Time  ? WBC 4.6 08/11/2020 1100  ? RBC 4.44 08/11/2020 1100  ? HGB 13.3 08/11/2020 1100  ? HCT 39.4 08/11/2020 1100  ? PLT 192.0 08/11/2020 1100  ? MCV 88.7 08/11/2020 1100  ? MCHC 33.9 08/11/2020 1100  ? RDW 13.7 08/11/2020 1100  ? LYMPHSABS 1.4 08/11/2020 1100  ? MONOABS 0.4 08/11/2020 1100  ? EOSABS 0.1 08/11/2020 1100  ? BASOSABS 0.0 08/11/2020 1100  ? ? ?BMET ?   ?Component Value Date/Time  ? NA 141 08/11/2020 1100  ? K 4.9 08/11/2020 1100  ? CL 106 08/11/2020 1100  ? CO2  28 08/11/2020 1100  ? GLUCOSE 104 (H) 08/11/2020 1100  ? BUN 14 08/11/2020 1100  ? CREATININE 0.64 08/11/2020 1100  ? CALCIUM 9.8 08/11/2020 1100  ? GFRNONAA 94.96 06/06/2009 1016  ? GFRAA 94 06/04/2007 1522  ? ? ?BNP ?No results found for: BNP ? ?ProBNP ?No results found for: PROBNP ? ?Specialty Problems   ?None ? ? ?Allergies  ?Allergen Reactions  ? Pantoprazole Sodium Diarrhea  ?  Abdominal pain  ? Ranitidine Diarrhea  ?  abdominal pain  ? Omeprazole Diarrhea  ?  Abdominal pain  ? Sulfasalazine Rash  ?  Fever, chills, headache, muscle pain  ? ? ?Immunization History  ?Administered Date(s) Administered  ? Influenza Split 12/04/2010, 10/31/2015  ? Influenza Whole 12/29/2008, 11/22/2009  ? Influenza, High Dose Seasonal PF 11/20/2018  ? Influenza,inj,Quad PF,6+ Mos 12/06/2017  ? Influenza-Unspecified 11/29/2012, 01/04/2016, 10/30/2016  ? Moderna Sars-Covid-2 Vaccination 04/02/2019,  05/05/2019  ? Pneumococcal Conjugate-13 07/20/2015  ? Pneumococcal-Unspecified 01/30/2013  ? Td 12/29/2005  ? Tdap 10/31/2015  ? Zoster Recombinat (Shingrix) 01/11/2017, 04/11/2017  ? Zoster, Live 06/10/2009  ? ? ?Past Medical History:  ?Diagnosis Date  ? Blood transfusion without reported diagnosis   ? Chronic right ear pain   ? normal MRI  ? Esophageal stricture   ? Family history of breast cancer   ? Family history of colon cancer   ? Family history of melanoma   ? Family history of prostate cancer   ? Fibroids   ? uterine, history of  ? GERD (gastroesophageal reflux disease)   ? History of uterine prolapse   ? Hx of adenomatous polyp of colon 10/19/2014  ? Hyperlipidemia   ? Obesity   ? PVC (premature ventricular contraction)   ? first dx by holter in 1980's, echo (6/10) EF 26-94%, normal diastolic fxn, normal size RV and fxn, mild MR, PASP 44mHg  ? Tubal pregnancy   ? 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 Qualifier: Diagnosis of  By: BMarcelino ScotCMA, AAuburn Bilberry   ? ? ?Tobacco History: ?Social History  ? ?Tobacco Use  ?Smoking Status Never  ?Smokeless Tobacco Never  ? ?Counseling given: Not Answered ? ? ?Continue to not smoke ? ?Outpatient Encounter Medications as of 06/06/2021  ?Medication Sig  ? Ascorbic Acid (VITAMIN C) 1000 MG tablet Take 1,000 mg by mouth daily.  ? aspirin 81 MG tablet Take 81 mg by mouth daily.    ? calcium citrate-vitamin D (CITRACAL+D) 315-200 MG-UNIT per tablet Take 1 tablet by mouth 2 (two) times daily.  ? Cholecalciferol (VITAMIN D-3) 5000 UNITS TABS Take 5,000 Units by mouth daily.  ? Coenzyme Q10 (CO Q-10) 200 MG CAPS Take 200 mg by mouth daily.  ? CRANBERRY PO Take 650 mg by mouth daily.  ? estradiol (ESTRACE) 0.1 MG/GM vaginal cream Place 0.5 g vaginally 2 (two) times a week. Place 0.5g nightly for two weeks then twice a week after  ? Glucosamine-Chondroitin (GLUCOSAMINE CHONDR COMPLEX PO) Take 2 capsules by mouth daily.   ? GRAPE SEED  EXTRACT PO Take 1 capsule by mouth daily.  ? lansoprazole (PREVACID) 15 MG capsule Take 1 capsule (15 mg total) by mouth 2 (two) times daily before a meal. (Patient taking differently: Take 15 mg by mouth daily.)  ? LIALDA 1.2 g EC tablet Take 2 tablets by mouth twice daily  ? milk thistle 175 MG tablet Take 175 mg by mouth daily.  ? mirabegron ER (MYRBETRIQ) 50 MG TB24  tablet Take 50 mg by mouth daily.  ? Omega-3 Fatty Acids (FISH OIL TRIPLE STRENGTH PO) Take 1 capsule by mouth daily.  ? Polyethyl Glycol-Propyl Glycol (SYSTANE OP) Place 1 drop into both eyes daily.  ? rosuvastatin (CRESTOR) 20 MG tablet Take 1 tablet (20 mg total) by mouth at bedtime.  ? vitamin B-12 (CYANOCOBALAMIN) 1000 MCG tablet Take 1,000 mcg by mouth daily.  ? zinc gluconate 50 MG tablet Take 50 mg by mouth daily.  ? ?No facility-administered encounter medications on file as of 06/06/2021.  ? ? ? ?Review of Systems ? ?Review of Systems  ?No chest pain with exertion.  No orthopnea or PND.  No lower extremity swelling.  Comprehensive review of systems otherwise negative. ?Physical Exam ? ?BP 120/68 (BP Location: Left Arm, Patient Position: Sitting, Cuff Size: Normal)   Pulse 77   Temp 98.4 ?F (36.9 ?C) (Oral)   Ht 5' 2"  (1.575 m)   Wt 194 lb (88 kg)   LMP 05/29/1996   SpO2 98%   BMI 35.48 kg/m?  ? ?Wt Readings from Last 5 Encounters:  ?06/06/21 194 lb (88 kg)  ?05/10/21 201 lb (91.2 kg)  ?04/20/21 201 lb 9.6 oz (91.4 kg)  ?04/05/21 204 lb 3.2 oz (92.6 kg)  ?11/01/20 199 lb (90.3 kg)  ? ? ?BMI Readings from Last 5 Encounters:  ?06/06/21 35.48 kg/m?  ?05/10/21 36.76 kg/m?  ?04/20/21 36.87 kg/m?  ?04/05/21 37.35 kg/m?  ?11/01/20 36.40 kg/m?  ? ? ? ?Physical Exam ?General: Well-appearing, no acute distress ?Eyes: EOMI, icterus ?Neck: Supple, no JVP ?Pulmonary: Clear, no crackles, normal work of breathing ?Cardiovascular: Regular rate and rhythm, no murmur ?Abdomen: Nondistended, bowel sounds present ?MSK: No synovitis, joint effusion ?Neuro:  Normal gait, no weakness ?Psych: Normal mood, full affect ? ? ?Assessment & Plan:  ? ?Calcified granuloma: Right lower lobe, with associated calcified lymph nodes.  Likely sequela of old granulomatous disease, likely histop

## 2021-07-10 ENCOUNTER — Encounter: Payer: Self-pay | Admitting: Cardiology

## 2021-07-10 NOTE — Telephone Encounter (Signed)
Error

## 2021-08-07 ENCOUNTER — Ambulatory Visit
Admission: RE | Admit: 2021-08-07 | Discharge: 2021-08-07 | Disposition: A | Discharge status: Home / Self Care | Payer: Medicare Other | Source: Ambulatory Visit | Attending: Family Medicine | Admitting: Family Medicine

## 2021-08-07 DIAGNOSIS — Z1231 Encounter for screening mammogram for malignant neoplasm of breast: Secondary | ICD-10-CM | POA: Diagnosis not present

## 2021-08-08 ENCOUNTER — Telehealth: Payer: Self-pay

## 2021-08-08 NOTE — Telephone Encounter (Signed)
I left a message for the patient to return my call.

## 2021-08-08 NOTE — Telephone Encounter (Signed)
-----   Message from Abner Greenspan, MD sent at 08/07/2021  7:42 PM EDT ----- Mammogram report is negative  Repeat in a year

## 2021-08-09 ENCOUNTER — Telehealth: Payer: Self-pay | Admitting: Family Medicine

## 2021-08-09 DIAGNOSIS — E7800 Pure hypercholesterolemia, unspecified: Secondary | ICD-10-CM

## 2021-08-09 DIAGNOSIS — E559 Vitamin D deficiency, unspecified: Secondary | ICD-10-CM

## 2021-08-09 DIAGNOSIS — I10 Essential (primary) hypertension: Secondary | ICD-10-CM

## 2021-08-09 DIAGNOSIS — M810 Age-related osteoporosis without current pathological fracture: Secondary | ICD-10-CM

## 2021-08-09 DIAGNOSIS — R7303 Prediabetes: Secondary | ICD-10-CM

## 2021-08-09 NOTE — Telephone Encounter (Signed)
-----   Message from Ellamae Sia sent at 07/31/2021  2:27 PM EDT ----- Regarding: Lab orders for Thursday, 7.13.23 Patient is scheduled for CPX labs, please order future labs, Thanks , Karna Christmas

## 2021-08-10 ENCOUNTER — Other Ambulatory Visit: Payer: Medicare Other

## 2021-08-11 ENCOUNTER — Other Ambulatory Visit (INDEPENDENT_AMBULATORY_CARE_PROVIDER_SITE_OTHER): Payer: Medicare Other

## 2021-08-11 DIAGNOSIS — I10 Essential (primary) hypertension: Secondary | ICD-10-CM | POA: Diagnosis not present

## 2021-08-11 DIAGNOSIS — E7800 Pure hypercholesterolemia, unspecified: Secondary | ICD-10-CM | POA: Diagnosis not present

## 2021-08-11 DIAGNOSIS — R7303 Prediabetes: Secondary | ICD-10-CM | POA: Diagnosis not present

## 2021-08-11 DIAGNOSIS — E559 Vitamin D deficiency, unspecified: Secondary | ICD-10-CM

## 2021-08-11 LAB — CBC WITH DIFFERENTIAL/PLATELET
Basophils Absolute: 0 10*3/uL (ref 0.0–0.1)
Basophils Relative: 0.8 % (ref 0.0–3.0)
Eosinophils Absolute: 0.1 10*3/uL (ref 0.0–0.7)
Eosinophils Relative: 1.4 % (ref 0.0–5.0)
HCT: 40.2 % (ref 36.0–46.0)
Hemoglobin: 13.6 g/dL (ref 12.0–15.0)
Lymphocytes Relative: 27.5 % (ref 12.0–46.0)
Lymphs Abs: 1.1 10*3/uL (ref 0.7–4.0)
MCHC: 33.8 g/dL (ref 30.0–36.0)
MCV: 89.1 fl (ref 78.0–100.0)
Monocytes Absolute: 0.5 10*3/uL (ref 0.1–1.0)
Monocytes Relative: 11.3 % (ref 3.0–12.0)
Neutro Abs: 2.5 10*3/uL (ref 1.4–7.7)
Neutrophils Relative %: 59 % (ref 43.0–77.0)
Platelets: 190 10*3/uL (ref 150.0–400.0)
RBC: 4.51 Mil/uL (ref 3.87–5.11)
RDW: 13.8 % (ref 11.5–15.5)
WBC: 4.2 10*3/uL (ref 4.0–10.5)

## 2021-08-11 LAB — COMPREHENSIVE METABOLIC PANEL
ALT: 16 U/L (ref 0–35)
AST: 17 U/L (ref 0–37)
Albumin: 4.6 g/dL (ref 3.5–5.2)
Alkaline Phosphatase: 55 U/L (ref 39–117)
BUN: 17 mg/dL (ref 6–23)
CO2: 25 mEq/L (ref 19–32)
Calcium: 9.7 mg/dL (ref 8.4–10.5)
Chloride: 106 mEq/L (ref 96–112)
Creatinine, Ser: 0.7 mg/dL (ref 0.40–1.20)
GFR: 85.66 mL/min (ref >60.00)
Glucose, Bld: 105 mg/dL — ABNORMAL HIGH (ref 70–99)
Potassium: 3.9 mEq/L (ref 3.5–5.1)
Sodium: 138 mEq/L (ref 135–145)
Total Bilirubin: 0.7 mg/dL (ref 0.2–1.2)
Total Protein: 7.2 g/dL (ref 6.0–8.3)

## 2021-08-11 LAB — TSH: TSH: 3.12 u[IU]/mL (ref 0.35–5.50)

## 2021-08-11 LAB — LIPID PANEL
Cholesterol: 160 mg/dL (ref 0–200)
HDL: 58.6 mg/dL (ref >39.00)
LDL Cholesterol: 74 mg/dL (ref 0–99)
NonHDL: 101.02
Total CHOL/HDL Ratio: 3
Triglycerides: 135 mg/dL (ref 0.0–149.0)
VLDL: 27 mg/dL (ref 0.0–40.0)

## 2021-08-11 LAB — HEMOGLOBIN A1C: Hgb A1c MFr Bld: 6.3 % (ref 4.6–6.5)

## 2021-08-11 LAB — VITAMIN D 25 HYDROXY (VIT D DEFICIENCY, FRACTURES): VITD: 46.63 ng/mL (ref 30.00–100.00)

## 2021-08-17 ENCOUNTER — Other Ambulatory Visit (HOSPITAL_COMMUNITY)
Admission: RE | Admit: 2021-08-17 | Discharge: 2021-08-17 | Disposition: A | Discharge status: Home / Self Care | Payer: Medicare Other | Source: Ambulatory Visit | Attending: Family Medicine | Admitting: Family Medicine

## 2021-08-17 ENCOUNTER — Telehealth: Payer: Self-pay

## 2021-08-17 ENCOUNTER — Encounter: Payer: Self-pay | Admitting: Family Medicine

## 2021-08-17 ENCOUNTER — Ambulatory Visit (INDEPENDENT_AMBULATORY_CARE_PROVIDER_SITE_OTHER): Payer: Medicare Other | Admitting: Family Medicine

## 2021-08-17 VITALS — BP 124/80 | HR 68 | Ht 61.5 in | Wt 194.0 lb

## 2021-08-17 DIAGNOSIS — R7303 Prediabetes: Secondary | ICD-10-CM | POA: Diagnosis not present

## 2021-08-17 DIAGNOSIS — L304 Erythema intertrigo: Secondary | ICD-10-CM | POA: Insufficient documentation

## 2021-08-17 DIAGNOSIS — K219 Gastro-esophageal reflux disease without esophagitis: Secondary | ICD-10-CM

## 2021-08-17 DIAGNOSIS — E7800 Pure hypercholesterolemia, unspecified: Secondary | ICD-10-CM | POA: Diagnosis not present

## 2021-08-17 DIAGNOSIS — Z23 Encounter for immunization: Secondary | ICD-10-CM

## 2021-08-17 DIAGNOSIS — M810 Age-related osteoporosis without current pathological fracture: Secondary | ICD-10-CM | POA: Diagnosis not present

## 2021-08-17 DIAGNOSIS — E559 Vitamin D deficiency, unspecified: Secondary | ICD-10-CM

## 2021-08-17 DIAGNOSIS — Z0000 Encounter for general adult medical examination without abnormal findings: Secondary | ICD-10-CM | POA: Diagnosis not present

## 2021-08-17 DIAGNOSIS — K5130 Ulcerative (chronic) rectosigmoiditis without complications: Secondary | ICD-10-CM

## 2021-08-17 DIAGNOSIS — Z01419 Encounter for gynecological examination (general) (routine) without abnormal findings: Secondary | ICD-10-CM | POA: Insufficient documentation

## 2021-08-17 DIAGNOSIS — Z8742 Personal history of other diseases of the female genital tract: Secondary | ICD-10-CM

## 2021-08-17 DIAGNOSIS — Z6836 Body mass index (BMI) 36.0-36.9, adult: Secondary | ICD-10-CM

## 2021-08-17 DIAGNOSIS — I10 Essential (primary) hypertension: Secondary | ICD-10-CM

## 2021-08-17 MED ORDER — KETOCONAZOLE 2 % EX CREA: 1.0000 | TOPICAL_CREAM | Freq: Every day | CUTANEOUS | 1 refills | Status: DC

## 2021-08-17 MED ORDER — ROSUVASTATIN CALCIUM 20 MG PO TABS: 20.0000 mg | ORAL_TABLET | Freq: Every day | ORAL | 3 refills | Status: DC

## 2021-08-17 NOTE — Assessment & Plan Note (Signed)
dexa 2021 Next one is scheduled for October  No falls or fx Taking vit D and level is therapeutic Encouraged wt bearing exercise

## 2021-08-17 NOTE — Patient Instructions (Addendum)
I highly recommend a flu and covid shot in the fall   Pneumonia vaccine today   To prevent diabetes  Try to get most of your carbohydrates from produce (with the exception of white potatoes)  Eat less bread/pasta/rice/snack foods/cereals/sweets and other items from the middle of the grocery store (processed carbs)  I will send a pap  If bleeding re occurs let me know I would consider a gyn referral   Continue current medicines  Stay active and take care of yourself

## 2021-08-17 NOTE — Assessment & Plan Note (Signed)
Reviewed health habits including diet and exercise and skin cancer prevention Reviewed appropriate screening tests for age  Also reviewed health mt list, fam hx and immunization status , as well as social and family history   See HPI Labs reviewed  pna vaccine given  Encouraged flu and covid imms in the fall Mammogram utd dexa scheduled in October (no falls or fractures)  Gyn exam done Colonoscopy utd in setting of colitis

## 2021-08-17 NOTE — Assessment & Plan Note (Signed)
Controlled with prevacid 15 mg daily

## 2021-08-17 NOTE — Telephone Encounter (Signed)
I like to [facility] on [address] - start withthem

## 2021-08-17 NOTE — Assessment & Plan Note (Signed)
bp in fair control at this time  BP Readings from Last 1 Encounters:  08/17/21 124/80   No medication, is controlled with lifestyle habits currently Most recent labs reviewed  Disc lifstyle change with low sodium diet and exercise

## 2021-08-17 NOTE — Assessment & Plan Note (Signed)
utd colonoscopy /GI care

## 2021-08-17 NOTE — Assessment & Plan Note (Signed)
Lab Results  Component Value Date   HGBA1C 6.3 08/11/2021   disc imp of low glycemic diet and wt loss to prevent DM2

## 2021-08-17 NOTE — Assessment & Plan Note (Addendum)
Exam done- pap done from vaginal cuff which was slt hyperemic  Pt has h/o several episodes of scant vaginal bleeding (with past partial hysterectomy)   She does use vaginal estrogen from urology which is helpful

## 2021-08-17 NOTE — Assessment & Plan Note (Signed)
Yeast -under pannus  Px ketoconazole to use prn  inst to keep area clean and dry

## 2021-08-17 NOTE — Assessment & Plan Note (Signed)
Disc goals for lipids and reasons to control them Rev last labs with pt Rev low sat fat diet in detail  Plan to continue crestor 20 mg daily  Rev her cardiac ca score /rev risks  Has had nuclear stress test  Working on lifestyle habits

## 2021-08-17 NOTE — Assessment & Plan Note (Signed)
Vitamin D level is therapeutic with current supplementation Disc importance of this to bone and overall health Level of 46

## 2021-08-17 NOTE — Telephone Encounter (Signed)
Called and lvm for patient her vm per her request regarding this information.

## 2021-08-17 NOTE — Progress Notes (Signed)
Subjective:    Patient ID: Rachel Carlson, female    DOB: 23-Oct-1945, 76 y.o.   MRN: [id number]  HPI Here for health maintenance exam and to review chronic medical problems   Wt Readings from Last 3 Encounters:  08/17/21 194 lb (88 kg)  06/06/21 194 lb (88 kg)  05/10/21 201 lb (91.2 kg)   36.06 kg/m  Feeling ok overall    Immunization History  Administered Date(s) Administered   Influenza Split 12/04/2010, 10/31/2015   Influenza Whole 12/29/2008, 11/22/2009   Influenza, High Dose Seasonal PF 11/20/2018   Influenza,inj,Quad PF,6+ Mos 12/06/2017   Influenza-Unspecified 11/29/2012, 01/04/2016, 10/30/2016   Moderna Sars-Covid-2 Vaccination 04/02/2019, 05/05/2019   Pneumococcal Conjugate-13 07/20/2015   Pneumococcal-Unspecified 01/30/2013   Td 12/29/2005   Tdap 10/31/2015   Zoster Recombinat (Shingrix) 01/11/2017, 04/11/2017   Zoster, Live 06/10/2009   Pna 23 vaccine   Has had covid and monoclonal ab   Has not had flu vaccine in a few years   Mammogram 07/2021 Self breast exam: no lumps   Had some vaginal bleeding in setting of hysterectomy (no cervix or uterus)  Twice -small amount (2 Tbsp of blood)  One time each time , end of June   She is seldom sexually active  Uses vaginal est cream twice weekly    Still has bladder issues    Colonoscopy 12/2019 with 7 y recall  H/o chronic ulcerative colitis, stable   Dexa 2021 Is scheduled for October  Had 5 y course of evista in the past  Falls: none  Fractures: none  Supplements : D D level is 46 Exercise : gardening   HTN bp is stable today  No cp or palpitations or headaches or edema  No medicines  BP Readings from Last 3 Encounters:  08/17/21 124/80  06/06/21 120/68  04/20/21 120/82    Lifestyle controlled   GERD Prevacid 15 mg daily   Hyperlipidemia Lab Results  Component Value Date   CHOL 160 08/11/2021   CHOL 153 08/11/2020   CHOL 148 08/07/2019   Lab Results  Component Value  Date   HDL 58.60 08/11/2021   HDL 62.00 08/11/2020   HDL 62.60 08/07/2019   Lab Results  Component Value Date   LDLCALC 74 08/11/2021   LDLCALC 66 08/11/2020   LDLCALC 59 08/07/2019   Lab Results  Component Value Date   TRIG 135.0 08/11/2021   TRIG 124.0 08/11/2020   TRIG 131.0 08/07/2019   Lab Results  Component Value Date   CHOLHDL 3 08/11/2021   CHOLHDL 2 08/11/2020   CHOLHDL 2 08/07/2019   Lab Results  Component Value Date   LDLDIRECT 257.3 06/04/2008   LDLDIRECT 203.9 06/04/2007   Crestor 20 mg daily  Diet is fair/trying to avoid the fatty foods  Likes fried veggies but avoiding them Occ bacon   The 10-year ASCVD risk score (Arnett DK, et al., 2019) is: 11.8%   Values used to calculate the score:     Age: 18 years     Sex: Female     Is Non-Hispanic African American: No     Diabetic: No     Tobacco smoker: No     Systolic Blood Pressure: 829 mmHg     Is BP treated: No     HDL Cholesterol: 58.6 mg/dL     Total Cholesterol: 160 mg/dL  Coronary ca score was in 73%ile Then nuclear stress test also   Found a granuloma in lung - and saw pulmonary/following  that  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 Lab Results  Component Value Date   WBC 4.2 08/11/2021   HGB 13.6 08/11/2021   HCT 40.2 08/11/2021   MCV 89.1 08/11/2021   PLT 190.0 08/11/2021   Lab Results  Component Value Date   TSH 3.12 08/11/2021   Patient Active Problem List   Diagnosis Date Noted   History of  vaginal bleeding 08/17/2021   Visit for routine gyn exam 08/17/2021   Intertrigo 08/17/2021   Nonrheumatic mitral valve regurgitation 04/16/2019   Essential hypertension 04/11/2018   PVC's (premature ventricular contractions) 04/11/2018   Class 2 severe obesity with serious comorbidity and body mass index (BMI) of 36.0 to 36.9 in adult [facility]) 12/06/2017   Genetic testing 09/26/2017   Family history of breast cancer    Family history of colon cancer    Family history of prostate cancer    Family history of melanoma    Family history of Lynch syndrome - son 08/04/2017   Prediabetes 07/20/2015   Estrogen deficiency 04/27/2015   GERD (gastroesophageal reflux disease) 03/23/2015   Hx of adenomatous polyp of colon 10/19/2014   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[place]) 09/62/8366   Pain in joint, pelvic region and thigh 07/24/2014   Need for hepatitis C screening test 07/12/2014   Pelvic pain in female 07/12/2014   Vitamin D deficiency 07/03/2014   Encounter for Medicare annual wellness exam 07/08/2013   Osteoporosis 05/24/2013   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   Detrusor muscle hypertonia 11/15/2010   Adnexal pain 11/15/2010   Urge incontinence 11/15/2010   Family history of coronary artery disease 10/15/2010   Routine general medical examination at a health care facility 06/01/2010   FASCIITIS, PLANTAR 06/06/2009   Hyperlipidemia 06/03/2007   Urge urinary incontinence 06/03/2007   MIGRAINES, HX OF 06/03/2007   Past Medical History:  Diagnosis Date   Blood transfusion without reported diagnosis    Chronic right ear pain    normal MRI   Esophageal stricture    Family history of breast cancer    Family history of colon cancer    Family history of melanoma    Family history of prostate cancer    Fibroids    uterine, history of   GERD (gastroesophageal reflux disease)    History of uterine prolapse    Hx of adenomatous polyp of colon 10/19/2014    Hyperlipidemia    Obesity    PVC (premature ventricular contraction)    first dx by holter in 1980's, echo (6/10) EF 29-47%, normal diastolic fxn, normal size RV and fxn, mild MR, PASP 50mHg   Tubal pregnancy    Ulcerative pro[place]osigmoiditis ([place]    Urge urinary incontinence 06/03/2007   Qualifier: Diagnosis of  By: BMarcelino ScotCMA, AAuburn Bilberry    Past Surgical History:  Procedure Laterality Date   BLADDER NECK  SUSPENSION  1998   BREAST BIOPSY Left    CATARACT EXTRACTION Right 01/2020   CATARACT EXTRACTION W/PHACO Left 08/12/2020   Procedure: CATARACT EXTRACTION PHACO AND INTRAOCULAR LENS PLACEMENT LEFT EYE;  Surgeon: Baruch Goldmann, MD;  Location: [facility];  Service: Ophthalmology;  Laterality: Left;  left CDE=10.71   COLONOSCOPY  05/18/2011/10/12/14   Dr. Silvano Rusk   ECTOPIC PREGNANCY SURGERY  1980   ESOPHAGOGASTRODUODENOSCOPY (EGD) WITH ESOPHAGEAL DILATION  04/03/12   Dr. Silvano Rusk   PARTIAL HYSTERECTOMY     abdominal, fibroids, and prolapse (1998) bladder tack   UPPER GASTROINTESTINAL ENDOSCOPY     Social History   Tobacco Use   Smoking status: Never   Smokeless tobacco: Never  Vaping Use   Vaping Use: Never used  Substance Use Topics   Alcohol use: No    Alcohol/week: 0.0 standard drinks of alcohol    Comment: rarely beer   Drug use: No   Family History  Problem Relation Age of Onset   Sudden death Father 19       "coronary arteriosclerosis" on death certificate   Heart attack Mother 26   Transient ischemic attack Mother    Diabetes type II Mother    Sudden death Mother        died age 20   Diabetes Mother    Coronary artery disease Mother    Skin cancer Mother    Barrett's esophagus Mother    Arrhythmia Brother    Colon cancer Brother 72   Breast cancer Sister        dx in her 5s   Colon polyps Sister        adenomatous   Colon cancer Son 37       lynch syndrome   Melanoma Maternal Uncle    Congestive Heart Failure Maternal Grandmother    Prostate  cancer Maternal Grandfather        dx in 39s   Tuberculosis Paternal Grandfather    Skin cancer Maternal Aunt    Barrett's esophagus Maternal Aunt    Other Son        parotid gland tumor   Ehlers-Danlos syndrome Daughter    Colon cancer Cousin        mid 4s; maternal cousin   Colon cancer Other        MGMs brother   Esophageal cancer Neg Hx    Stomach cancer Neg Hx    Rectal cancer Neg Hx    Allergies  Allergen Reactions   Pantoprazole Sodium Diarrhea    Abdominal pain   Ranitidine Diarrhea    abdominal pain   Omeprazole Diarrhea    Abdominal pain   Sulfasalazine Rash    Fever, chills, headache, muscle pain   Current Outpatient Medications on File Prior to Visit  Medication Sig Dispense Refill   Ascorbic Acid (VITAMIN C) 1000 MG tablet Take 1,000 mg by mouth daily.     aspirin 81 MG tablet Take 81 mg by mouth daily.       calcium citrate-vitamin D (CITRACAL+D) 315-200 MG-UNIT per tablet Take 1 tablet by mouth 2 (two) times daily.     Cholecalciferol (VITAMIN D-3) 5000 UNITS TABS Take 5,000 Units by mouth daily.     Coenzyme Q10 (CO Q-10) 200 MG CAPS Take 200 mg by mouth daily.     CRANBERRY PO Take 650 mg by mouth daily.     estradiol (ESTRACE) 0.1 MG/GM vaginal cream Place 0.5 g vaginally 2 (two) times a week.  Place 0.5g nightly for two weeks then twice a week after 30 g 11   Glucosamine-Chondroitin (GLUCOSAMINE CHONDR COMPLEX PO) Take 2 capsules by mouth daily.      GRAPE SEED EXTRACT PO Take 1 capsule by mouth daily.     lansoprazole (PREVACID) 15 MG capsule Take 1 capsule (15 mg total) by mouth 2 (two) times daily before a meal. (Patient taking differently: Take 15 mg by mouth daily.)     LIALDA 1.2 g EC tablet Take 2 tablets by mouth twice daily 120 tablet 0   milk thistle 175 MG tablet Take 175 mg by mouth daily.     mirabegron ER (MYRBETRIQ) 50 MG TB24 tablet Take 50 mg by mouth daily.     Omega-3 Fatty Acids (FISH OIL TRIPLE STRENGTH PO) Take 1 capsule by mouth  daily.     Polyethyl Glycol-Propyl Glycol (SYSTANE OP) Place 1 drop into both eyes daily.     vitamin B-12 (CYANOCOBALAMIN) 1000 MCG tablet Take 1,000 mcg by mouth daily.     zinc gluconate 50 MG tablet Take 50 mg by mouth daily.     No current facility-administered medications on file prior to visit.    Review of Systems  Constitutional:  Negative for activity change, appetite change, fatigue, fever and unexpected weight change.  HENT:  Negative for congestion, ear pain, rhinorrhea, sinus pressure and sore throat.   Eyes:  Negative for pain, redness and visual disturbance.  Respiratory:  Negative for cough, shortness of breath and wheezing.   Cardiovascular:  Negative for chest pain and palpitations.  Gastrointestinal:  Negative for abdominal pain, blood in stool, constipation and diarrhea.  Endocrine: Negative for polydipsia and polyuria.  Genitourinary:  Positive for vaginal bleeding. Negative for dysuria, frequency, urgency and vaginal discharge.  Musculoskeletal:  Positive for arthralgias. Negative for back pain and myalgias.  Skin:  Negative for pallor and rash.  Allergic/Immunologic: Negative for environmental allergies.  Neurological:  Negative for dizziness, syncope and headaches.  Hematological:  Negative for adenopathy. Does not bruise/bleed easily.  Psychiatric/Behavioral:  Negative for decreased concentration and dysphoric mood. The patient is not nervous/anxious.        Objective:   Physical Exam Constitutional:      General: She is not in acute distress.    Appearance: Normal appearance. She is well-developed. She is obese. She is not ill-appearing or diaphoretic.  HENT:     Head: Normocephalic and atraumatic.     Right Ear: Tympanic membrane, ear canal and external ear normal.     Left Ear: Tympanic membrane, ear canal and external ear normal.     Nose: Nose normal. No congestion.     Mouth/Throat:     Mouth: Mucous membranes are moist.     Pharynx: Oropharynx is  clear. No posterior oropharyngeal erythema.  Eyes:     General: No scleral icterus.    Extraocular Movements: Extraocular movements intact.     Conjunctiva/sclera: Conjunctivae normal.     Pupils: Pupils are equal, round, and reactive to light.  Neck:     Thyroid: No thyromegaly.     Vascular: No carotid bruit or JVD.  Cardiovascular:     Rate and Rhythm: Normal rate and regular rhythm.     Pulses: Normal pulses.     Heart sounds: Normal heart sounds.     No gallop.  Pulmonary:     Effort: Pulmonary effort is normal. No respiratory distress.     Breath sounds: Normal breath sounds. No wheezing.  Comments: Good air exch Chest:     Chest wall: No tenderness.  Abdominal:     General: Bowel sounds are normal. There is no distension or abdominal bruit.     Palpations: Abdomen is soft. There is no mass.     Tenderness: There is no abdominal tenderness.     Hernia: No hernia is present.  Genitourinary:    Comments: Breast exam: No mass, nodules, thickening, tenderness, bulging, retraction, inflamation, nipple discharge or skin changes noted.  No axillary or clavicular LA.                   Anus appears normal w/o hemorrhoids or masses       External genitalia : nl appearance and hair distribution/no lesions       Urethral meatus : nl size, no lesions or prolapse       Urethra: no masses, tenderness or scarring      Bladder : no masses or tenderness       Vagina: nl general appearance, no discharge or  Lesions, no significant cystocele  or rectocele       Vaginal cuff : mildly hyperemic, no bleeding       Uterus: surgically absent     Adnexa : unable to palpate          Musculoskeletal:        General: No tenderness. Normal range of motion.     Cervical back: Normal range of motion and neck supple. No rigidity. No muscular tenderness.     Right lower leg: No edema.     Left lower leg: No edema.     Comments: No kyphosis   Lymphadenopathy:     Cervical: No cervical  adenopathy.  Skin:    General: Skin is warm and dry.     Coloration: Skin is not pale.     Findings: No erythema or rash.     Comments: Solar lentigines diffusely Some sks  Neurological:     Mental Status: She is alert. Mental status is at baseline.     Cranial Nerves: No cranial nerve deficit.     Motor: No abnormal muscle tone.     Coordination: Coordination normal.     Gait: Gait normal.     Deep Tendon Reflexes: Reflexes are normal and symmetric. Reflexes normal.  Psychiatric:        Mood and Affect: Mood normal.        Cognition and Memory: Cognition and memory normal.           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[place])    utd colonoscopy /GI care      GERD (gastroesophageal reflux disease)    Controlled with prevacid 15 mg daily         Musculoskeletal and Integument   Intertrigo    Yeast -under pannus  Px ketoconazole to use prn  inst to keep area clean and dry  Osteoporosis    dexa 2021 Next one is scheduled for October  No falls or fx Taking vit D and level is therapeutic Encouraged wt bearing exercise         Other   Class 2 severe obesity with serious comorbidity and body mass index (BMI) of 36.0 to 36.9 in adult [facility])    Discussed how this problem influences overall health and the risks it imposes  Reviewed plan for weight loss with lower calorie diet (via better food choices and also portion control or program like weight watchers) and exercise building up to or more than 30  minutes 5 days per week including some aerobic activity         History of vaginal bleeding    2 episodes of vaginal bleeding recently (small amount) in pt who had a hysterectomy  Nl exam  Pap obt from vaginal cuff which was hyperemic        Hyperlipidemia    Disc goals for lipids and reasons to control them Rev last labs with pt Rev low sat fat diet in detail  Plan to continue crestor 20 mg daily  Rev her cardiac ca score /rev risks  Has had nuclear stress test  Working on lifestyle habits       Relevant Medications   rosuvastatin (CRESTOR) 20 MG tablet   Prediabetes    Lab Results  Component Value Date   HGBA1C 6.3 08/11/2021  disc imp of low glycemic diet and wt loss to prevent DM2       Routine general medical examination at a health care facility - Primary    Reviewed health habits including diet and exercise and skin cancer prevention Reviewed appropriate screening tests for age  Also reviewed health mt list, fam hx and immunization status , as well as social and family history   See HPI Labs reviewed  pna vaccine given  Encouraged flu and covid imms in the fall Mammogram utd dexa scheduled in October (no falls or fractures)  Gyn exam done Colonoscopy utd in setting of colitis       Visit for routine gyn exam    Exam done- pap done from vaginal cuff which was slt hyperemic  Pt has h/o several episodes of scant vaginal bleeding (with past partial hysterectomy)   She does use vaginal estrogen from urology which is helpful        Relevant Orders   Cytology - PAP([place])   Vitamin D deficiency    Vitamin D level is therapeutic with current supplementation Disc importance of this to bone and overall health Level of 46       Other Visit Diagnoses     Need for pneumococcal vaccination       Relevant Orders   Pneumococcal polysaccharide vaccine 23-valent greater than or equal to 2yo subcutaneous/IM (Completed)

## 2021-08-17 NOTE — Assessment & Plan Note (Signed)
Discussed how this problem influences overall health and the risks it imposes  Reviewed plan for weight loss with lower calorie diet (via better food choices and also portion control or program like weight watchers) and exercise building up to or more than 30 minutes 5 days per week including some aerobic activity    

## 2021-08-17 NOTE — Telephone Encounter (Signed)
Pt forgot to ask you when she was a name of  a dermatology in the [place] area that you can recommend. She has couple of spots that she  needs to have looked at.

## 2021-08-17 NOTE — Assessment & Plan Note (Signed)
2 episodes of vaginal bleeding recently (small amount) in pt who had a hysterectomy  Nl exam  Pap obt from vaginal cuff which was hyperemic

## 2021-08-18 LAB — CYTOLOGY - PAP: Diagnosis: NEGATIVE

## 2021-08-25 ENCOUNTER — Ambulatory Visit (INDEPENDENT_AMBULATORY_CARE_PROVIDER_SITE_OTHER): Payer: Medicare Other

## 2021-08-25 VITALS — Ht 61.5 in | Wt 194.0 lb

## 2021-08-25 DIAGNOSIS — Z0000 Encounter for general adult medical examination without abnormal findings: Secondary | ICD-10-CM | POA: Diagnosis not present

## 2021-08-25 NOTE — Patient Instructions (Addendum)
Rachel Carlson , Thank you for taking time to come for your Medicare Wellness Visit. I appreciate your ongoing commitment to your health goals. Please review the following plan we discussed and let me know if I can assist you in the future.   These are the goals we discussed:  Goals       other      Starting 10/20/2015, I will continue to take medication as prescribed in an effort to remain healthy.       Patient Stated      08/25/2019, I will maintain and continue medications as prescribed.       Patient Stated      08/25/2020, I will maintain and continue medications as prescribed.      Patient stated (pt-stated)      Continue gardening and finish house projects.        This is a list of the screening recommended for you and due dates:  Health Maintenance  Topic Date Due   COVID-19 Vaccine (3 - Moderna series) 09/10/2021*   Flu Shot  08/29/2021   Mammogram  08/08/2022   Tetanus Vaccine  10/30/2025   Colon Cancer Screening  01/01/2027   Pneumonia Vaccine  Completed   DEXA scan (bone density measurement)  Completed   Hepatitis C Screening: USPSTF Recommendation to screen - Ages 45-79 yo.  Completed   Zoster (Shingles) Vaccine  Completed   HPV Vaccine  Aged Out  *Topic was postponed. The date shown is not the original due date.   Advanced directives: Yes  Conditions/risks identified: None  Next appointment: Follow up in one year for your annual wellness visit     Preventive Care 65 Years and Older, Female Preventive care refers to lifestyle choices and visits with your health care provider that can promote health and wellness. What does preventive care include? A yearly physical exam. This is also called an annual well check. Dental exams once or twice a year. Routine eye exams. Ask your health care provider how often you should have your eyes checked. Personal lifestyle choices, including: Daily care of your teeth and gums. Regular physical activity. Eating a  healthy diet. Avoiding tobacco and drug use. Limiting alcohol use. Practicing safe sex. Taking low-dose aspirin every day. Taking vitamin and mineral supplements as recommended by your health care provider. What happens during an annual well check? The services and screenings done by your health care provider during your annual well check will depend on your age, overall health, lifestyle risk factors, and family history of disease. Counseling  Your health care provider may ask you questions about your: Alcohol use. Tobacco use. Drug use. Emotional well-being. Home and relationship well-being. Sexual activity. Eating habits. History of falls. Memory and ability to understand (cognition). Work and work Statistician. Reproductive health. Screening  You may have the following tests or measurements: Height, weight, and BMI. Blood pressure. Lipid and cholesterol levels. These may be checked every 5 years, or more frequently if you are over 46 years old. Skin check. Lung cancer screening. You may have this screening every year starting at age 71 if you have a 30-pack-year history of smoking and currently smoke or have quit within the past 15 years. Fecal occult blood test (FOBT) of the stool. You may have this test every year starting at age 39. Flexible sigmoidoscopy or colonoscopy. You may have a sigmoidoscopy every 5 years or a colonoscopy every 10 years starting at age 69. Hepatitis C blood test. Hepatitis B blood  test. Sexually transmitted disease (STD) testing. Diabetes screening. This is done by checking your blood sugar (glucose) after you have not eaten for a while (fasting). You may have this done every 1-3 years. Bone density scan. This is done to screen for osteoporosis. You may have this done starting at age 34. Mammogram. This may be done every 1-2 years. Talk to your health care provider about how often you should have regular mammograms. Talk with your health care  provider about your test results, treatment options, and if necessary, the need for more tests. Vaccines  Your health care provider may recommend certain vaccines, such as: Influenza vaccine. This is recommended every year. Tetanus, diphtheria, and acellular pertussis (Tdap, Td) vaccine. You may need a Td booster every 10 years. Zoster vaccine. You may need this after age 39. Pneumococcal 13-valent conjugate (PCV13) vaccine. One dose is recommended after age 84. Pneumococcal polysaccharide (PPSV23) vaccine. One dose is recommended after age 28. Talk to your health care provider about which screenings and vaccines you need and how often you need them. This information is not intended to replace advice given to you by your health care provider. Make sure you discuss any questions you have with your health care provider. Document Released: 02/11/2015 Document Revised: 10/05/2015 Document Reviewed: 11/16/2014 Elsevier Interactive Patient Education  2017 [place] Prevention in the Home Falls can cause injuries. They can happen to people of all ages. There are many things you can do to make your home safe and to help prevent falls. What can I do on the outside of my home? Regularly fix the edges of walkways and driveways and fix any cracks. Remove anything that might make you trip as you walk through a door, such as a raised step or threshold. Trim any bushes or trees on the path to your home. Use bright outdoor lighting. Clear any walking paths of anything that might make someone trip, such as rocks or tools. Regularly check to see if handrails are loose or broken. Make sure that both sides of any steps have handrails. Any raised decks and porches should have guardrails on the edges. Have any leaves, snow, or ice cleared regularly. Use sand or salt on walking paths during winter. Clean up any spills in your garage right away. This includes oil or grease spills. What can I do in the  bathroom? Use night lights. Install grab bars by the toilet and in the tub and shower. Do not use towel bars as grab bars. Use non-skid mats or decals in the tub or shower. If you need to sit down in the shower, use a plastic, non-slip stool. Keep the floor dry. Clean up any water that spills on the floor as soon as it happens. Remove soap buildup in the tub or shower regularly. Attach bath mats securely with double-sided non-slip rug tape. Do not have throw rugs and other things on the floor that can make you trip. What can I do in the bedroom? Use night lights. Make sure that you have a light by your bed that is easy to reach. Do not use any sheets or blankets that are too big for your bed. They should not hang down onto the floor. Have a firm chair that has side arms. You can use this for support while you get dressed. Do not have throw rugs and other things on the floor that can make you trip. What can I do in the kitchen? Clean up any spills right  away. Avoid walking on wet floors. Keep items that you use a lot in easy-to-reach places. If you need to reach something above you, use a strong step stool that has a grab bar. Keep electrical cords out of the way. Do not use floor polish or wax that makes floors slippery. If you must use wax, use non-skid floor wax. Do not have throw rugs and other things on the floor that can make you trip. What can I do with my stairs? Do not leave any items on the stairs. Make sure that there are handrails on both sides of the stairs and use them. Fix handrails that are broken or loose. Make sure that handrails are as long as the stairways. Check any carpeting to make sure that it is firmly attached to the stairs. Fix any carpet that is loose or worn. Avoid having throw rugs at the top or bottom of the stairs. If you do have throw rugs, attach them to the floor with carpet tape. Make sure that you have a light switch at the top of the stairs and the  bottom of the stairs. If you do not have them, ask someone to add them for you. What else can I do to help prevent falls? Wear shoes that: Do not have high heels. Have rubber bottoms. Are comfortable and fit you well. Are closed at the toe. Do not wear sandals. If you use a stepladder: Make sure that it is fully opened. Do not climb a closed stepladder. Make sure that both sides of the stepladder are locked into place. Ask someone to hold it for you, if possible. Clearly mark and make sure that you can see: Any grab bars or handrails. First and last steps. Where the edge of each step is. Use tools that help you move around (mobility aids) if they are needed. These include: Canes. Walkers. Scooters. Crutches. Turn on the lights when you go into a dark area. Replace any light bulbs as soon as they burn out. Set up your furniture so you have a clear path. Avoid moving your furniture around. If any of your floors are uneven, fix them. If there are any pets around you, be aware of where they are. Review your medicines with your doctor. Some medicines can make you feel dizzy. This can increase your chance of falling. Ask your doctor what other things that you can do to help prevent falls. This information is not intended to replace advice given to you by your health care provider. Make sure you discuss any questions you have with your health care provider. Document Released: 11/11/2008 Document Revised: 06/23/2015 Document Reviewed: 02/19/2014 Elsevier Interactive Patient Education  2017 [employer].

## 2021-08-25 NOTE — Progress Notes (Signed)
Subjective:   Rachel Carlson is a 76 y.o. female who presents for Medicare Annual (Subsequent) preventive examination.  Review of Systems    Virtual Visit via Telephone Note  I connected with  Rachel Carlson on 08/25/21 at  3:00 PM EDT by telephone and verified that I am speaking with the correct person using two identifiers.  Location: Patient: Home Provider: Office Persons participating in the virtual visit: patient/Nurse Health Advisor   I discussed the limitations, risks, security and privacy concerns of performing an evaluation and management service by telephone and the availability of in person appointments. The patient expressed understanding and agreed to proceed.  Interactive audio and video telecommunications were attempted between this nurse and patient, however failed, due to patient having technical difficulties OR patient did not have access to video capability.  We continued and completed visit with audio only.  Some vital signs may be absent or patient reported.   Criselda Peaches, LPN  Cardiac Risk Factors include: advanced age (>56mn, >>12women);hypertension     Objective:    Today's Vitals   08/25/21 1500  Weight: 194 lb (88 kg)  Height: 5' 1.5" (1.562 m)   Body mass index is 36.06 kg/m.     08/25/2021    3:11 PM 10/13/2020    2:54 PM 08/25/2020    9:48 AM 08/25/2019    9:46 AM 01/01/2018    3:44 PM 10/20/2015   10:09 AM  Advanced Directives  Does Patient Have a Medical Advance Directive? Yes Yes Yes Yes Yes No  Type of AParamedicof [place]Living will [place]royo GardensLiving will [place]Living will [place]ochbu[place]Living will    Does patient want to make changes to medical advance directive? No - Patient declined No - Patient declined      Copy of [place] Chart? No - copy requested No - copy requested No - copy requested No - copy requested     Would patient like information on creating a medical advance directive?      No - patient declined information    Current Medications (verified) Outpatient Encounter Medications as of 08/25/2021  Medication Sig   Ascorbic Acid (VITAMIN C) 1000 MG tablet Take 1,000 mg by mouth daily.   aspirin 81 MG tablet Take 81 mg by mouth daily.     calcium citrate-vitamin D (CITRACAL+D) 315-200 MG-UNIT per tablet Take 1 tablet by mouth 2 (two) times daily.   Cholecalciferol (VITAMIN D-3) 5000 UNITS TABS Take 5,000 Units by mouth daily.   Coenzyme Q10 (CO Q-10) 200 MG CAPS Take 200 mg by mouth daily.   CRANBERRY PO Take 650 mg by mouth daily.   estradiol (ESTRACE) 0.1 MG/GM vaginal cream Place 0.5 g vaginally 2 (two) times a week. Place 0.5g nightly for two weeks then twice a week after   Glucosamine-Chondroitin (GLUCOSAMINE CHONDR COMPLEX PO) Take 2 capsules by mouth daily.    GRAPE SEED EXTRACT PO Take 1 capsule by mouth daily.   ketoconazole (NIZORAL) 2 % cream Apply 1 Application topically daily. To yeast rash   lansoprazole (PREVACID) 15 MG capsule Take 1 capsule (15 mg total) by mouth 2 (two) times daily before a meal. (Patient taking differently: Take 15 mg by mouth daily.)   LIALDA 1.2 g EC tablet Take 2 tablets by mouth twice daily   milk thistle 175 MG tablet Take 175 mg by mouth daily.   mirabegron ER (MYRBETRIQ)  50 MG TB24 tablet Take 50 mg by mouth daily.   Omega-3 Fatty Acids (FISH OIL TRIPLE STRENGTH PO) Take 1 capsule by mouth daily.   Polyethyl Glycol-Propyl Glycol (SYSTANE OP) Place 1 drop into both eyes daily.   rosuvastatin (CRESTOR) 20 MG tablet Take 1 tablet (20 mg total) by mouth at bedtime.   vitamin B-12 (CYANOCOBALAMIN) 1000 MCG tablet Take 1,000 mcg by mouth daily.   zinc gluconate 50 MG tablet Take 50 mg by mouth daily.   No facility-administered encounter medications on file as of 08/25/2021.    Allergies (verified) Pantoprazole sodium, Ranitidine, Omeprazole, and  Sulfasalazine   History: Past Medical History:  Diagnosis Date   Blood transfusion without reported diagnosis    Chronic right ear pain    normal MRI   Esophageal stricture    Family history of breast cancer    Family history of colon cancer    Family history of melanoma    Family history of prostate cancer    Fibroids    uterine, history of   GERD (gastroesophageal reflux disease)    History of uterine prolapse    Hx of adenomatous polyp of colon 10/19/2014   Hyperlipidemia    Obesity    PVC (premature ventricular contraction)    first dx by holter in 1980's, echo (6/10) EF 22-02%, normal diastolic fxn, normal size RV and fxn, mild MR, PASP 70mHg   Tubal pregnancy    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 CSilvano Rusk  ECTOPIC PREGNANCY SURGERY  1980   ESOPHAGOGASTRODUODENOSCOPY (EGD) WITH ESOPHAGEAL DILATION  04/03/12   Dr. CSilvano Rusk  PARTIAL HYSTERECTOMY     abdominal, fibroids, and prolapse (1998) bladder tack   UPPER GASTROINTESTINAL ENDOSCOPY     Family History  Problem Relation Age of Onset   Sudden death Father 438      "coronary arteriosclerosis" on death certificate   Heart attack Mother 766  Transient ischemic attack Mother    Diabetes type II Mother    Sudden death Mother        died age 74  Diabetes Mother    Coronary artery disease Mother    Skin cancer Mother    Barrett's esophagus Mother    Arrhythmia Brother    Colon cancer Brother 384   Breast cancer Sister        dx in her 685s  Colon polyps Sister        adenomatous   Colon cancer Son 37       lynch syndrome   Melanoma Maternal Uncle    Congestive Heart Failure Maternal Grandmother    Prostate cancer Maternal Grandfather        dx in 769s  Tuberculosis Paternal Grandfather    Skin cancer Maternal Aunt    Barrett's esophagus Maternal Aunt    Other Son        parotid gland tumor   Ehlers-Danlos syndrome Daughter  Colon cancer Cousin        mid 35s; maternal cousin   Colon cancer Other        MGMs brother   Esophageal cancer Neg Hx    Stomach cancer Neg Hx    Rectal cancer Neg Hx    Social History   Socioeconomic History   Marital status: Married    Spouse name: Not on file   Number of children: 3   Years of education: Not on file   Highest education level: Not on file  Occupational History   Occupation: Animal nutritionist    Comment:      Employer: unemployed  Tobacco Use   Smoking status: Never   Smokeless tobacco: Never  Vaping Use   Vaping Use: Never used  Substance and Sexual Activity   Alcohol use: No    Alcohol/week: 0.0 standard drinks of alcohol    Comment: rarely beer   Drug use: No   Sexual activity: Not Currently  Other Topics Concern   Not on file  Social History Narrative   Retired Animal nutritionist, 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)   Grown children   Never smoker no alcohol tobacco or drug use   Social Determinants of Health   Financial Resource Strain: Low Risk  (08/25/2021)   Overall Financial Resource Strain (CARDIA)    Difficulty of Paying Living Expenses: Not hard at all  Food Insecurity: No Food Insecurity (08/25/2021)   Hunger Vital Sign    Worried About Running Out of Food in the Last Year: Never true    [place] in the Last Year: Never true  Transportation Needs: No Transportation Needs (08/25/2021)   PRAPARE - Hydrologist (Medical): No    Lack of Transportation (Non-Medical):  No  Physical Activity: Inactive (08/25/2021)   Exercise Vital Sign    Days of Exercise per Week: 0 days    Minutes of Exercise per Session: 0 min  Stress: No Stress Concern Present (08/25/2021)   [place]    Feeling of Stress : Not at all  Social Connections: [place] (08/25/2021)   Social Connection and Isolation Panel [NHANES]    Frequency of Communication with Friends and Family: More than three times a week    Frequency of Social Gatherings with Friends and Family: More than three times a week    Attends Religious Services: More than 4 times per year    Active Member of [employer] or Organizations: Yes    Attends Music therapist: More than 4 times per year    Marital Status: Married    Tobacco Counseling Counseling given: Not Answered   Clinical Intake: How often do you need to have someone help you when you read instructions, pamphlets, or other written materials from your doctor or pharmacy?: 1 - Never  Diabetic?  No  Activities of Daily Living    08/25/2021    3:07 PM  In your present state of health, do you have any difficulty performing the following activities:  Hearing? 0  Vision? 0  Difficulty concentrating or making decisions? 0  Walking or climbing stairs? 0  Dressing or bathing? 0  Doing errands, shopping? 0  Preparing Food and eating ? N  Using the Toilet? N  In the past six months, have you accidently leaked urine? Y  Comment Followed by Urogynecologist  Do you have problems with loss of bowel control? N  Managing your Medications? N  Managing your Finances? N  Housekeeping or managing your Housekeeping? N    Patient Care Team: Tower, Wynelle Fanny, MD as PCP - General  Indicate any recent Medical Services you may have received from other than [facility] providers in the past year (date may be approximate).     Assessment:   This is a routine wellness examination for  Merlie.  Hearing/Vision screen Hearing Screening - Comments:: No hearing difficulty Vision Screening - Comments:: Wears glasses. Followed by Dr Marisa Hua  Dietary issues and exercise activities discussed: Exercise limited by: None identified   Goals Addressed               This Visit's Progress     Patient stated (pt-stated)        Continue gardening and finish house projects.       Depression Screen    08/25/2021    3:05 PM 08/17/2021    9:18 AM 08/25/2020    9:52 AM 08/25/2019    9:49 AM 08/07/2018    9:40 AM 07/15/2017   12:10 PM 10/20/2015   10:09 AM  PHQ 2/9 Scores  PHQ - 2 Score 0 0 0 0 0 0 0  PHQ- 9 Score   0 0       Fall Risk    08/25/2021    3:10 PM 08/17/2021    9:18 AM 08/25/2020    9:52 AM 08/25/2019    9:48 AM 08/07/2018    9:40 AM  [place] in the past year? 0 0 0 1 0  Comment    tripped in mud puddle   Number falls in past yr: 0  0 0   Injury with Fall? 0  0 0   Risk for fall due to : No Fall Risks  No Fall Risks No Fall Risks   Follow up  Falls evaluation completed Falls evaluation completed;Falls prevention discussed Falls evaluation completed;Falls prevention discussed     FALL RISK PREVENTION PERTAINING TO THE HOME:  Any stairs in or around the home? Yes  If so, are there any without handrails? No Home free of loose throw rugs in walkways, pet beds, electrical cords, etc? Yes  Adequate lighting in your home to reduce risk of falls? Yes   ASSISTIVE DEVICES UTILIZED TO PREVENT FALLS:  Life alert? No  Use of a cane, walker or w/c? No  Grab bars in the bathroom? Yes  Shower chair or bench in shower? Yes  Elevated toilet seat or a handicapped toilet? Yes   TIMED UP AND GO:  Was the test performed? No . Audio Visit  Cognitive Function:    08/25/2020    9:58 AM 08/25/2019    9:52 AM 10/20/2015   10:28 AM  MMSE - Mini Mental State Exam  Orientation to time 5 5 5   Orientation to Place 5 5 5   Registration 3 3 3   Attention/ Calculation  5 5 0  Recall 3 3 3   Language- name 2 objects   0  Language- repeat 1 1 1   Language- follow 3 step command   3  Language- read & follow direction   0  Write a sentence   0  Copy design   0  Total score   20        08/25/2021    3:11 PM  6CIT Screen  What Year? 0 points  What month? 0 points  What time? 0 points  Count back from  20 0 points  Months in reverse 0 points  Repeat phrase 0 points  Total Score 0 points    Immunizations Immunization History  Administered Date(s) Administered   Influenza Split 12/04/2010, 10/31/2015   Influenza Whole 12/29/2008, 11/22/2009   Influenza, High Dose Seasonal PF 11/20/2018   Influenza,inj,Quad PF,6+ Mos 12/06/2017   Influenza-Unspecified 11/29/2012, 01/04/2016, 10/30/2016   Moderna Sars-Covid-2 Vaccination 04/02/2019, 05/05/2019   Pneumococcal Conjugate-13 07/20/2015   Pneumococcal Polysaccharide-23 08/17/2021   Pneumococcal-Unspecified 01/30/2013   Td 12/29/2005   Tdap 10/31/2015   Zoster Recombinat (Shingrix) 01/11/2017, 04/11/2017   Zoster, Live 06/10/2009    TDAP status: Up to date    Pneumococcal vaccine status: Up to date  Covid-19 vaccine status: Completed vaccines  Qualifies for Shingles Vaccine? Yes   Zostavax completed Yes   Shingrix Completed?: Yes  Screening Tests Health Maintenance  Topic Date Due   COVID-19 Vaccine (3 - Moderna series) 09/10/2021 (Originally 06/30/2019)   INFLUENZA VACCINE  08/29/2021   MAMMOGRAM  08/08/2022   TETANUS/TDAP  10/30/2025   COLONOSCOPY (Pts 45-66yr Insurance coverage will need to be confirmed)  01/01/2027   Pneumonia Vaccine 74 Years old  Completed   DEXA SCAN  Completed   Hepatitis C Screening  Completed   Zoster Vaccines- Shingrix  Completed   HPV VACCINES  Aged Out    Health Maintenance  There are no preventive care reminders to display for this patient.   Colorectal cancer screening: Type of screening: Colonoscopy. Completed 01/01/20. Repeat every 7  years  Mammogram status: Completed 08/07/21. Repeat every year  Bone Density status: Completed 05/29/17. Results reflect: Bone density results: OSTEOPOROSIS. Repeat every   years.  Lung Cancer Screening: (Low Dose CT Chest recommended if Age 74-80years, 30 pack-year currently smoking OR have quit w/in 15years.) does not qualify.    Additional Screening:  Hepatitis C Screening: does qualify; Completed 07/12/14  Vision Screening: Recommended annual ophthalmology exams for early detection of glaucoma and other disorders of the eye. Is the patient up to date with their annual eye exam?  Yes  Who is the provider or what is the name of the office in which the patient attends annual eye exams? Dr WMarisa HuaIf pt is not established with a provider, would they like to be referred to a provider to establish care? No .   Dental Screening: Recommended annual dental exams for proper oral hygiene  Community Resource Referral / Chronic Care Management:  CRR required this visit?  No   CCM required this visit?  No      Plan:     I have personally reviewed and noted the following in the patient's chart:   Medical and social history Use of alcohol, tobacco or illicit drugs  Current medications and supplements including opioid prescriptions.  Functional ability and status Nutritional status Physical activity Advanced directives List of other physicians Hospitalizations, surgeries, and ER visits in previous 12 months Vitals Screenings to include cognitive, depression, and falls Referrals and appointments  In addition, I have reviewed and discussed with patient certain preventive protocols, quality metrics, and best practice recommendations. A written personalized care plan for preventive services as well as general preventive health recommendations were provided to patient.     BCriselda Peaches LPN   [id number]  Nurse Notes: None

## 2021-09-08 ENCOUNTER — Encounter: Payer: Self-pay | Admitting: Obstetrics and Gynecology

## 2021-09-08 ENCOUNTER — Ambulatory Visit: Payer: Medicare Other | Admitting: Obstetrics and Gynecology

## 2021-09-08 VITALS — BP 140/74 | HR 70

## 2021-09-08 DIAGNOSIS — N952 Postmenopausal atrophic vaginitis: Secondary | ICD-10-CM | POA: Diagnosis not present

## 2021-09-08 DIAGNOSIS — N3281 Overactive bladder: Secondary | ICD-10-CM

## 2021-09-08 MED ORDER — ESTRADIOL 0.1 MG/GM VA CREA: 0.5000 g | TOPICAL_CREAM | VAGINAL | 11 refills | Status: DC

## 2021-09-08 MED ORDER — TROSPIUM CHLORIDE 20 MG PO TABS: 20.0000 mg | ORAL_TABLET | Freq: Two times a day (BID) | ORAL | 5 refills | Status: DC

## 2021-09-08 NOTE — Progress Notes (Signed)
[facility]   Subjective:     Chief Complaint:  Chief Complaint  Patient presents with   Follow-up    Rachel Carlson is a 76 y.o. female here for a medication f/u. Pt said Myrbetriq Is not working    History of Present Illness: Rachel Carlson is a 76 y.o. 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 Currently on Myrbetriq 29m daily.  Medication is not working well for her. Still leaking on the way to the bathroom. She is trying to do kegel exercises and bladder retraining, but still the symptoms return. Waking up every 2 hours at night. She has noticed that aspartame irritates her bladder   Not using the vitamin E cream anymore because it was causing irritation vaginally. She Coconut oil was too messy.  Using estrogen cream twice a week. Needs refill of estrogen.   Past Medical History: Patient  has a past medical history of Blood transfusion without reported diagnosis, Chronic right ear pain, Esophageal stricture, Family history of breast cancer, Family history of colon cancer, Family history of melanoma, Family history of prostate cancer, Fibroids, GERD (gastroesophageal reflux disease), History of uterine prolapse, adenomatous polyp of colon (10/19/2014), Hyperlipidemia, Obesity, PVC (premature ventricular contraction), Tubal pregnancy, Ulcerative pro[place]osigmoiditis ([place], and Urge urinary incontinence (06/03/2007).   Past Surgical History: She  has a past surgical history that includes Ectopic pregnancy surgery (1980); Partial hysterectomy; Bladder neck suspension (1998); Esophagogastroduodenoscopy (egd) with esophageal dilation (04/03/12); Upper gastrointestinal endoscopy; Breast biopsy (Left); Colonoscopy (05/18/2011/10/12/14); Cataract extraction (Right, 01/2020); and Cataract extraction w/PHACO (Left, 08/12/2020).   Medications: She has a current medication list which includes the following prescription(s): vitamin c, aspirin,  calcium citrate-vitamin d, vitamin d-3, co q-10, cranberry, estradiol, glucosamine-chondroitin, grape seed, ketoconazole, lansoprazole, lialda, milk thistle, mirabegron er, omega-3 fatty acids, polyethyl glycol-propyl glycol, rosuvastatin, cyanocobalamin, and zinc gluconate.   Allergies: Patient is allergic to pantoprazole sodium, ranitidine, omeprazole, and sulfasalazine.   Social History: Patient  reports that she has never smoked. She has never used smokeless tobacco. She reports that she does not drink alcohol and does not use drugs.      Objective:    Physical Exam:  Gen: No apparent distress, A&O x 3.  Detailed Urogynecologic Evaluation:  deferred  Assessment/Plan:    Assessment: [name] a 76y.o. 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   Plan: - Again reviewed alternatives to medications for overactive bladder: botox, SNM and PTNS. She is leaning more towards SNM. Discussed procedure for placement and showed her the battery and where it would be placed. She will think about this more.  - Wants to try another medication in the meantime. Prescribed Trospium 254mBID.  - Also considering surgery for prolapse and SUI (anterior repair with sacrospinous ligament fixation, sling) but has concerns about pain after surgery and taking time off from activity. - Refill for estrace cream provided. Recommended using v-magic balm since this may be less messy for her. Also can consider a silicone lubricant for moisture.   Return 2 months   MiJaquita FoldsMD

## 2021-09-08 NOTE — Patient Instructions (Addendum)
V-magic is an alternative for vaginal moisture- it is a bit more solid. Or a silicone based lubricant can last longer but it is still liquid.

## 2021-10-13 ENCOUNTER — Telehealth: Payer: Self-pay | Admitting: Internal Medicine

## 2021-10-13 MED ORDER — HYDROCORTISONE (PERIANAL) 2.5 % EX CREA: 1.0000 | TOPICAL_CREAM | Freq: Two times a day (BID) | CUTANEOUS | 1 refills | Status: DC

## 2021-10-13 NOTE — Telephone Encounter (Signed)
Patient called states she had a bad episode of constipation, and now her tissues are swollen. She is requesting a call back as soon as possible. Please call to advise.

## 2021-10-13 NOTE — Telephone Encounter (Signed)
Pt stated that she recently started taking the Trospium about 3 weeks ago for an overactive bladder: Pt stated that one of the side effects of the medications is constipation: Pt stated that she was severely constipated yesterday and after using a suppository with no relief pt stated that she manually disimpacted her stool over the coarse of 12 hours: Pt states now that her rectal tissue is very swollen and makes her feel that she has to have a BM all the time and she cannot control her bowels: Pt is requesting a suppository or cream to assist with the swollen rectal tissue:

## 2021-10-13 NOTE — Telephone Encounter (Signed)
Pt made aware of Dr. Carlean Purl recommendations and that the prescription has been sent to her pharmacy: Pt verbalized understanding with all questions answered.

## 2021-10-13 NOTE — Telephone Encounter (Signed)
I sent the following  Meds ordered this encounter  Medications   hydrocortisone (ANUSOL-HC) 2.5 % rectal cream    Sig: Place 1 Application rectally 2 (two) times daily.    Dispense:  30 g    Refill:  1

## 2021-10-18 ENCOUNTER — Telehealth: Payer: Self-pay | Admitting: Internal Medicine

## 2021-10-18 ENCOUNTER — Other Ambulatory Visit: Payer: Self-pay | Admitting: Internal Medicine

## 2021-10-18 DIAGNOSIS — M25511 Pain in right shoulder: Secondary | ICD-10-CM | POA: Diagnosis not present

## 2021-10-18 DIAGNOSIS — M7541 Impingement syndrome of right shoulder: Secondary | ICD-10-CM | POA: Diagnosis not present

## 2021-10-18 NOTE — Telephone Encounter (Signed)
Pt stated that she was having Diarrhea from an UC flare for a week:  and requesting enemas to be prescribed:  Pt was scheduled for an Office Visit with Ellouise Newer PA on 10/19/2021 at 10:00 AM for evaluation and treatment:  Pt verbalized understanding with all questions answered.

## 2021-10-18 NOTE — Telephone Encounter (Signed)
Patient came into the office, wanting to be seen or to have a nurse call her. She said she wanted her hydrocortisone rectal cream refilled. Said she is has been out. She said if you can call the Home phone number around 3pm. Because she will not be home.

## 2021-10-19 ENCOUNTER — Encounter: Payer: Self-pay | Admitting: Physician Assistant

## 2021-10-19 ENCOUNTER — Ambulatory Visit: Payer: Medicare Other | Admitting: Physician Assistant

## 2021-10-19 VITALS — BP 126/86 | HR 76 | Ht 61.0 in | Wt 189.0 lb

## 2021-10-19 DIAGNOSIS — K5900 Constipation, unspecified: Secondary | ICD-10-CM

## 2021-10-19 DIAGNOSIS — K5130 Ulcerative (chronic) rectosigmoiditis without complications: Secondary | ICD-10-CM | POA: Diagnosis not present

## 2021-10-19 MED ORDER — HYDROCORTISONE 100 MG/60ML RE ENEM: 1.0000 | ENEMA | Freq: Every day | RECTAL | 0 refills | Status: DC

## 2021-10-19 NOTE — Patient Instructions (Addendum)
If you are age 74 or older, your body mass index should be between 23-30. Your Body mass index is 35.71 kg/m. If this is out of the aforementioned range listed, please consider follow up with your Primary Care Provider. ________________________________________________________  The [facility] providers would like to encourage you to use [facility] to communicate with providers for non-urgent requests or questions.  Due to long hold times on the telephone, sending your provider a message by [facility] may be a faster and more efficient way to get a response.  Please allow 48 business hours for a response.  Please remember that this is for non-urgent requests.  _______________________________________________________   We have sent the following medications to your pharmacy for you to pick up at your convenience: Cortenema (take at bedtime)  Take Miralax daily.   It was a pleasure to see you today!  Thank you for trusting me with your gastrointestinal care!

## 2021-10-19 NOTE — Progress Notes (Signed)
Chief Complaint: Follow-up diarrhea  HPI:    Rachel Carlson is a 76 year old female with past medical history as listed below including chronic ulcerative rectosigmoiditis, known to Dr. Carlean Purl, who was referred to me by Tower, Wynelle Fanny, MD for a complaint of diarrhea.    December 2021 colonoscopy with erythema in the distal colon and rectum but no active IBD.    04/05/2021 patient seen in clinic by Dr. Carlean Purl and at that time was following up with ulcerative proctosigmoiditis and fecal urgency issues, she was maintained on Mesalamine 2.4 g twice daily.  Noted she had last been seen in October 2022 and was seeing urogynecology and has been referred to pelvic PT, [facility] recommended.  At that time it was thought her IBD was under good control and her bowel movement issues are more likely related to pelvic organ prolapse.  Recommend she return in a year or as needed.  Every 5-year colonoscopy given family history of colon cancer.    10/13/2021 patient called with constipation problems and "swollen tissues back there", given Anusol cream to be placed rectally twice daily.    10/18/2021 patient came into the office and described diarrhea from UC flare and was requesting enemas to be prescribed.    Today, the patient tells me the most recently she grew constipated on her Trospium which she was using for her bladder.  Tells me the medication was working well for her bladder but she was unable to have a bowel movement at all for 5 days and then tried stool softener as well as an enema which did nothing and eventually had to manually disimpact herself.  Tells me though the stool was not actually in her rectum it was further up.  She then stopped eating for a few days and just now started back on some clear liquids yesterday, has not had another bowel movement.  She thinks maybe her colitis is flared up as well.  Does tell me her rectum got very "agitated" with the manual manipulation.  She was using Hydrocortisone  cream as above which seemed to help a little bit.    Retired Animal nutritionist.    Denies fever, chills, blood in her stool, weight loss or abdominal pain.  Past Medical History:  Diagnosis Date   Blood transfusion without reported diagnosis    Chronic right ear pain    normal MRI   Esophageal stricture    Family history of breast cancer    Family history of colon cancer    Family history of melanoma    Family history of prostate cancer    Fibroids    uterine, history of   GERD (gastroesophageal reflux disease)    History of uterine prolapse    Hx of adenomatous polyp of colon 10/19/2014   Hyperlipidemia    Obesity    PVC (premature ventricular contraction)    first dx by holter in 1980's, echo (6/10) EF 14-97%, normal diastolic fxn, normal size RV and fxn, mild MR, PASP 25mHg   Tubal pregnancy    Ulcerative proctosigmoiditis ([facility]    Urge urinary incontinence 06/03/2007   Qualifier: Diagnosis of  By: BMarcelino ScotCMA, AAuburn Bilberry     Past Surgical History:  Procedure Laterality Date   BLADDER NECK SUSPENSION  1998   BREAST BIOPSY Left    CATARACT EXTRACTION Right 01/2020   CATARACT EXTRACTION W/PHACO Left 08/12/2020   Procedure: CATARACT EXTRACTION PHACO AND INTRAOCULAR LENS PLACEMENT LEFT EYE;  Surgeon: WBaruch Goldmann MD;  Location:  [facility];  Service: Ophthalmology;  Laterality: Left;  left CDE=10.71   COLONOSCOPY  05/18/2011/10/12/14   Dr. Silvano Rusk   ECTOPIC PREGNANCY SURGERY  1980   ESOPHAGOGASTRODUODENOSCOPY (EGD) WITH ESOPHAGEAL DILATION  04/03/12   Dr. Silvano Rusk   PARTIAL HYSTERECTOMY     abdominal, fibroids, and prolapse (1998) bladder tack   UPPER GASTROINTESTINAL ENDOSCOPY      Current Outpatient Medications  Medication Sig Dispense Refill   Ascorbic Acid (VITAMIN C) 1000 MG tablet Take 1,000 mg by mouth daily.     aspirin 81 MG tablet Take 81 mg by mouth daily.       calcium citrate-vitamin D (CITRACAL+D) 315-200 MG-UNIT per tablet Take 1 tablet by mouth 2  (two) times daily.     Cholecalciferol (VITAMIN D-3) 5000 UNITS TABS Take 5,000 Units by mouth daily.     Coenzyme Q10 (CO Q-10) 200 MG CAPS Take 200 mg by mouth daily.     CRANBERRY PO Take 650 mg by mouth daily.     estradiol (ESTRACE) 0.1 MG/GM vaginal cream Place 0.5 g vaginally 2 (two) times a week. Place 0.5g nightly for two weeks then twice a week after 30 g 11   Glucosamine-Chondroitin (GLUCOSAMINE CHONDR COMPLEX PO) Take 2 capsules by mouth daily.      GRAPE SEED EXTRACT PO Take 1 capsule by mouth daily.     hydrocortisone (ANUSOL-HC) 2.5 % rectal cream Place 1 Application rectally 2 (two) times daily. 30 g 1   ketoconazole (NIZORAL) 2 % cream Apply 1 Application topically daily. To yeast rash 30 g 1   lansoprazole (PREVACID) 15 MG capsule Take 1 capsule (15 mg total) by mouth 2 (two) times daily before a meal. (Patient taking differently: Take 15 mg by mouth daily.)     LIALDA 1.2 g EC tablet Take 2 tablets by mouth twice daily 120 tablet 3   milk thistle 175 MG tablet Take 175 mg by mouth daily.     Omega-3 Fatty Acids (FISH OIL TRIPLE STRENGTH PO) Take 1 capsule by mouth daily.     Polyethyl Glycol-Propyl Glycol (SYSTANE OP) Place 1 drop into both eyes daily.     rosuvastatin (CRESTOR) 20 MG tablet Take 1 tablet (20 mg total) by mouth at bedtime. 90 tablet 3   trospium (SANCTURA) 20 MG tablet Take 1 tablet (20 mg total) by mouth 2 (two) times daily. 60 tablet 5   vitamin B-12 (CYANOCOBALAMIN) 1000 MCG tablet Take 1,000 mcg by mouth daily.     zinc gluconate 50 MG tablet Take 50 mg by mouth daily.     No current facility-administered medications for this visit.    Allergies as of 10/19/2021 - Review Complete 09/08/2021  Allergen Reaction Noted   Pantoprazole sodium Diarrhea 03/03/2013   Ranitidine Diarrhea 03/03/2013   Omeprazole Diarrhea 06/05/2012   Sulfasalazine Rash 11/08/2014    Family History  Problem Relation Age of Onset   Sudden death Father 105       "coronary  arteriosclerosis" on death certificate   Heart attack Mother 84   Transient ischemic attack Mother    Diabetes type II Mother    Sudden death Mother        died age 37   Diabetes Mother    Coronary artery disease Mother    Skin cancer Mother    Barrett's esophagus Mother    Arrhythmia Brother    Colon cancer Brother 36   Breast cancer Sister  dx in her 54s   Colon polyps Sister        adenomatous   Colon cancer Son 10       lynch syndrome   Melanoma Maternal Uncle    Congestive Heart Failure Maternal Grandmother    Prostate cancer Maternal Grandfather        dx in 49s   Tuberculosis Paternal Grandfather    Skin cancer Maternal Aunt    Barrett's esophagus Maternal Aunt    Other Son        parotid gland tumor   Ehlers-Danlos syndrome Daughter    Colon cancer Cousin        mid 69s; maternal cousin   Colon cancer Other        MGMs brother   Esophageal cancer Neg Hx    Stomach cancer Neg Hx    Rectal cancer Neg Hx     Social History   Socioeconomic History   Marital status: Married    Spouse name: Not on file   Number of children: 3   Years of education: Not on file   Highest education level: Not on file  Occupational History   Occupation: Animal nutritionist    Comment:      Employer: unemployed  Tobacco Use   Smoking status: Never   Smokeless tobacco: Never  Vaping Use   Vaping Use: Never used  Substance and Sexual Activity   Alcohol use: No    Alcohol/week: 0.0 standard drinks of alcohol    Comment: rarely beer   Drug use: No   Sexual activity: Not Currently  Other Topics Concern   Not on file  Social History Narrative   Retired Animal nutritionist, 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)   Grown children   Never smoker no alcohol tobacco or drug use   Social Determinants of Radio broadcast assistant Strain: Low Risk  (08/25/2021)   Overall Financial Resource Strain (CARDIA)    Difficulty of Paying Living Expenses: Not hard at all  Food Insecurity: No  Food Insecurity (08/25/2021)   Hunger Vital Sign    Worried About Running Out of Food in the Last Year: Never true    Ran Out of Food in the Last Year: Never true  Transportation Needs: No Transportation Needs (08/25/2021)   PRAPARE - Hydrologist (Medical): No    Lack of Transportation (Non-Medical): No  Physical Activity: Inactive (08/25/2021)   Exercise Vital Sign    Days of Exercise per Week: 0 days    Minutes of Exercise per Session: 0 min  Stress: No Stress Concern Present (08/25/2021)   [place]    Feeling of Stress : Not at all  Social Connections: [place] (08/25/2021)   Social Connection and Isolation Panel [NHANES]    Frequency of Communication with Friends and Family: More than three times a week    Frequency of Social Gatherings with Friends and Family: More than three times a week    Attends Religious Services: More than 4 times per year    Active Member of [employer] or Organizations: Yes    Attends Archivist Meetings: More than 4 times per year    Marital Status: Married  Human resources officer Violence: Not At Risk (08/25/2021)   Humiliation, Afraid, Rape, and Kick questionnaire    Fear of Current or Ex-Partner: No    Emotionally Abused: No    Physically Abused: No  Sexually Abused: No    Review of Systems:    Constitutional: No weight loss, fever or chills Cardiovascular: No chest pain  Respiratory: No SOB Gastrointestinal: See HPI and otherwise negative   Physical Exam:  Vital signs: BP 126/86   Pulse 76   Ht 5' 1"  (1.549 m)   Wt 189 lb (85.7 kg)   LMP 05/29/1996   BMI 35.71 kg/m    Constitutional:   Pleasant Caucasian female appears to be in NAD, Well developed, Well nourished, alert and cooperative Respiratory: Respirations even and unlabored. Lungs clear to auscultation bilaterally.   No wheezes, crackles, or rhonchi.  Cardiovascular: Normal S1,  S2. No MRG. Regular rate and rhythm. No peripheral edema, cyanosis or pallor.  Gastrointestinal:  Soft, nondistended, nontender. No rebound or guarding. Normal bowel sounds. No appreciable masses or hepatomegaly. Rectal:  Not performed.  Psychiatric: Oriented to person, place and time. Demonstrates good judgement and reason without abnormal affect or behaviors.  RELEVANT LABS AND IMAGING: CBC    Component Value Date/Time   WBC 4.2 08/11/2021 0939   RBC 4.51 08/11/2021 0939   HGB 13.6 08/11/2021 0939   HCT 40.2 08/11/2021 0939   PLT 190.0 08/11/2021 0939   MCV 89.1 08/11/2021 0939   MCHC 33.8 08/11/2021 0939   RDW 13.8 08/11/2021 0939   LYMPHSABS 1.1 08/11/2021 0939   MONOABS 0.5 08/11/2021 0939   EOSABS 0.1 08/11/2021 0939   BASOSABS 0.0 08/11/2021 0939    CMP     Component Value Date/Time   NA 138 08/11/2021 0939   K 3.9 08/11/2021 0939   CL 106 08/11/2021 0939   CO2 25 08/11/2021 0939   GLUCOSE 105 (H) 08/11/2021 0939   BUN 17 08/11/2021 0939   CREATININE 0.70 08/11/2021 0939   CALCIUM 9.7 08/11/2021 0939   PROT 7.2 08/11/2021 0939   ALBUMIN 4.6 08/11/2021 0939   AST 17 08/11/2021 0939   ALT 16 08/11/2021 0939   ALKPHOS 55 08/11/2021 0939   BILITOT 0.7 08/11/2021 0939   GFRNONAA 94.96 06/06/2009 1016   GFRAA 94 06/04/2007 1522    Assessment: 1.  Chronic ulcerative rectosigmoiditis: With recent constipation flare, patient feels her symptoms are aggravated 2.  Constipation: With new medication for her bladder  Plan: 1.  Patient has since stopped her Trospium.  Hopefully this will help with her constipation. 2.  Recommend the patient start MiraLAX, discussed she can use this once daily starting now but can titrate up to 4 times a day if necessary. 3.  Refilled patient's Cortenemas 100 mg per 60 mL 1 enema rectally for 30 days.  #30 with 0 refills. 4.  Patient to follow in clinic with [place] as needed.  If symptoms do not get much better may need to consider repeat flex  sig.  Ellouise Newer, PA-C [facility] 10/19/2021, 9:57 AM  Cc: Tower, Wynelle Fanny, MD

## 2021-10-20 ENCOUNTER — Other Ambulatory Visit: Payer: Self-pay

## 2021-10-20 MED ORDER — HYDROCORTISONE 100 MG/60ML RE ENEM: ENEMA | RECTAL | 0 refills | Status: DC

## 2021-10-31 ENCOUNTER — Ambulatory Visit
Admission: RE | Admit: 2021-10-31 | Discharge: 2021-10-31 | Disposition: A | Discharge status: Home / Self Care | Payer: Medicare Other | Source: Ambulatory Visit | Attending: Family Medicine | Admitting: Family Medicine

## 2021-10-31 DIAGNOSIS — E2839 Other primary ovarian failure: Secondary | ICD-10-CM

## 2021-10-31 DIAGNOSIS — Z780 Asymptomatic menopausal state: Secondary | ICD-10-CM | POA: Diagnosis not present

## 2021-10-31 DIAGNOSIS — M8589 Other specified disorders of bone density and structure, multiple sites: Secondary | ICD-10-CM | POA: Diagnosis not present

## 2021-11-01 ENCOUNTER — Encounter: Payer: Self-pay | Admitting: *Deleted

## 2021-11-09 DIAGNOSIS — D216 Benign neoplasm of connective and other soft tissue of trunk, unspecified: Secondary | ICD-10-CM | POA: Diagnosis not present

## 2021-11-09 DIAGNOSIS — D235 Other benign neoplasm of skin of trunk: Secondary | ICD-10-CM | POA: Diagnosis not present

## 2021-11-09 DIAGNOSIS — D225 Melanocytic nevi of trunk: Secondary | ICD-10-CM | POA: Diagnosis not present

## 2021-11-09 DIAGNOSIS — Z1283 Encounter for screening for malignant neoplasm of skin: Secondary | ICD-10-CM | POA: Diagnosis not present

## 2021-11-09 DIAGNOSIS — L821 Other seborrheic keratosis: Secondary | ICD-10-CM | POA: Diagnosis not present

## 2021-11-09 DIAGNOSIS — L700 Acne vulgaris: Secondary | ICD-10-CM | POA: Diagnosis not present

## 2021-11-09 DIAGNOSIS — L820 Inflamed seborrheic keratosis: Secondary | ICD-10-CM | POA: Diagnosis not present

## 2021-11-10 ENCOUNTER — Ambulatory Visit (INDEPENDENT_AMBULATORY_CARE_PROVIDER_SITE_OTHER): Payer: Medicare Other | Admitting: Obstetrics and Gynecology

## 2021-11-10 ENCOUNTER — Encounter: Payer: Self-pay | Admitting: Obstetrics and Gynecology

## 2021-11-10 VITALS — BP 128/84 | HR 75

## 2021-11-10 DIAGNOSIS — N3281 Overactive bladder: Secondary | ICD-10-CM | POA: Diagnosis not present

## 2021-11-10 DIAGNOSIS — N993 Prolapse of vaginal vault after hysterectomy: Secondary | ICD-10-CM | POA: Diagnosis not present

## 2021-11-10 DIAGNOSIS — N393 Stress incontinence (female) (male): Secondary | ICD-10-CM

## 2021-11-10 DIAGNOSIS — N8110 Cystocele, unspecified: Secondary | ICD-10-CM | POA: Diagnosis not present

## 2021-11-10 NOTE — Progress Notes (Signed)
[facility] Return Visit  SUBJECTIVE  History of Present Illness: Rachel Carlson is a 76 y.o. female seen in follow-up for mixed incontinence.  She took the trospium for about 2.5 weeks but developed constipation that she was not able to tolerate. She is taking miralax twice a day. Bladder urgency is now back to baseline.   Has previously tried pessary, and other medications- mybetriq and gemtesa.   Past Medical History: Patient  has a past medical history of Blood transfusion without reported diagnosis, Chronic right ear pain, Esophageal stricture, Family history of breast cancer, Family history of colon cancer, Family history of melanoma, Family history of prostate cancer, Fibroids, GERD (gastroesophageal reflux disease), History of uterine prolapse, adenomatous polyp of colon (10/19/2014), Hyperlipidemia, Obesity, PVC (premature ventricular contraction), Tubal pregnancy, Ulcerative proctosigmoiditis ([name]), and Urge urinary incontinence (06/03/2007).   Past Surgical History: She  has a past surgical history that includes Ectopic pregnancy surgery (1980); Partial hysterectomy; Bladder neck suspension (1998); Esophagogastroduodenoscopy (egd) with esophageal dilation (04/03/12); Upper gastrointestinal endoscopy; Breast biopsy (Left); Colonoscopy (05/18/2011/10/12/14); Cataract extraction (Right, 01/2020); and Cataract extraction w/PHACO (Left, 08/12/2020).   Medications: She has a current medication list which includes the following prescription(s): vitamin c, aspirin, calcium citrate-vitamin d, vitamin d-3, co q-10, cranberry, estradiol, glucosamine-chondroitin, grape seed, hydrocortisone, hydrocortisone, ketoconazole, lansoprazole, lialda, milk thistle, omega-3 fatty acids, polyethyl glycol-propyl glycol, rosuvastatin, cyanocobalamin, zinc gluconate, and trospium.   Allergies: Patient is allergic to pantoprazole sodium, ranitidine, omeprazole, and sulfasalazine.   Social  History: Patient  reports that she has never smoked. She has never used smokeless tobacco. She reports that she does not drink alcohol and does not use drugs.      OBJECTIVE     Physical Exam: Vitals:   11/10/21 1140  BP: 128/84  Pulse: 75   Gen: No apparent distress, A&O x 3.  Detailed Urogynecologic Evaluation:  Deferred. Prior exam showed:  POP-Q (09/14/20):    POP-Q   -0.5                                            Aa   -0.5                                           Ba   -6                                              C    4                                            Gh   3                                            Pb   8  tvl    -3                                            Ap   -3                                            Bp                                                  D         ASSESSMENT AND PLAN    Rachel Carlson is a 76 y.o. with:  1. Prolapse of anterior vaginal wall   2. Vaginal vault prolapse after hysterectomy   3. SUI (stress urinary incontinence, female)   4. Overactive bladder    - Again reviewed alternatives to medications for overactive bladder: botox, SNM and PTNS.  - Was considering surgery for prolapse and SUI (anterior repair with sacrospinous ligament fixation, sling) but has concerns about pain after surgery and taking time off from activity. 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  - We discussed that if she would like an office based procedure then we can plan for urethral bulking and intravesical botox which would help with her incontinence symptoms. Would need simple CMG to demonstrate SUI prior to procedure and self- cath teaching.  - Initially, she scheduled procedures but then called office and said she needs a procedure with her GYN for polyp removal so will have to defer. She will call to reschedule.   [name], MD

## 2021-11-29 DIAGNOSIS — M25511 Pain in right shoulder: Secondary | ICD-10-CM | POA: Diagnosis not present

## 2021-12-27 ENCOUNTER — Encounter: Payer: Self-pay | Admitting: Internal Medicine

## 2021-12-27 ENCOUNTER — Ambulatory Visit: Payer: Medicare Other | Admitting: Internal Medicine

## 2021-12-27 VITALS — BP 128/82 | HR 77 | Ht 61.0 in | Wt 193.0 lb

## 2021-12-27 DIAGNOSIS — N8110 Cystocele, unspecified: Secondary | ICD-10-CM | POA: Diagnosis not present

## 2021-12-27 DIAGNOSIS — K5130 Ulcerative (chronic) rectosigmoiditis without complications: Secondary | ICD-10-CM

## 2021-12-27 DIAGNOSIS — N3281 Overactive bladder: Secondary | ICD-10-CM

## 2021-12-27 DIAGNOSIS — K5903 Drug induced constipation: Secondary | ICD-10-CM | POA: Diagnosis not present

## 2021-12-27 NOTE — Patient Instructions (Signed)
Dr Carlean Purl  recommends that you complete a bowel purge (to clean out your bowels) as needed. Please do the following: Purchase a bottle of Miralax over the counter as well as a box of 5 mg dulcolax tablets. Take 4 dulcolax tablets, (Bisacodyl)  Wait 1 hour. You will then drink 6-8 capfuls of Miralax mixed in an adequate amount of water/juice/gatorade (you may choose which of these liquids to drink) over the next 2-3 hours. You should expect results within 1 to 6 hours after completing the bowel purge.   I appreciate the opportunity to care for you. Silvano Rusk, MD

## 2021-12-27 NOTE — Progress Notes (Signed)
Karsten Vaughn Picchi 76 y.o. 03/24/1945 [id number]  Assessment & Plan:   Encounter Diagnoses  Name Primary?   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) Yes   Prolapse of anterior vaginal wall    Overactive bladder    Drug-induced constipation improved     Continue mesalamine MiraLax purge prn for constipation  Subjective:   Chief Complaint: Follow-up of ulcerative proctosigmoiditis and constipation.  HPI The patient was seen by Ellouise Newer 10/19/2021.  She had had some increased mucus streaked with blood as well as anorectal swelling due to severe constipation and straining that occurred after the use of trospium for bladder issues.  MiraLAX was used and she eventually overcame the constipation though it was a struggle, she is off the trospium and is going to try Botox injections from Dr. Wannetta Sender.  She used Cort enema prescribed by Anderson Malta a few times and the swelling and irritation calm down quickly.  She is back to baseline at this point but is concerned about what happens if she gets constipated again. Allergies  Allergen Reactions   Pantoprazole Sodium Diarrhea    Abdominal pain   Ranitidine Diarrhea    abdominal pain   Omeprazole Diarrhea    Abdominal pain   Sulfasalazine Rash    Fever, chills, headache, muscle pain   Current Meds  Medication Sig   Ascorbic Acid (VITAMIN C) 1000 MG tablet Take 1,000 mg by mouth daily.   aspirin 81 MG tablet Take 81 mg by mouth daily.     calcium citrate-vitamin D (CITRACAL+D) 315-200 MG-UNIT per tablet Take 1 tablet by mouth 2 (two) times daily.   Cholecalciferol (VITAMIN D-3) 5000 UNITS TABS Take 5,000 Units by mouth daily.   Coenzyme Q10 (CO Q-10) 200 MG CAPS Take 200 mg by mouth daily.   CRANBERRY PO Take 650 mg by mouth daily.   estradiol (ESTRACE) 0.1 MG/GM vaginal cream Place 0.5 g vaginally 2 (two) times a week. Place 0.5g nightly for two weeks then twice a week after   Glucosamine-Chondroitin  (GLUCOSAMINE CHONDR COMPLEX PO) Take 2 capsules by mouth daily.    GRAPE SEED EXTRACT PO Take 1 capsule by mouth daily.   hydrocortisone (ANUSOL-HC) 2.5 % rectal cream Place 1 Application rectally 2 (two) times daily.   hydrocortisone (CORTENEMA) 100 MG/60ML enema Use 76m every night for 28 days   ketoconazole (NIZORAL) 2 % cream Apply 1 Application topically daily. To yeast rash   lansoprazole (PREVACID) 15 MG capsule Take 1 capsule (15 mg total) by mouth 2 (two) times daily before a meal. (Patient taking differently: Take 15 mg by mouth daily.)   LIALDA 1.2 g EC tablet Take 2 tablets by mouth twice daily   milk thistle 175 MG tablet Take 175 mg by mouth daily.   Omega-3 Fatty Acids (FISH OIL TRIPLE STRENGTH PO) Take 1 capsule by mouth daily.   Polyethyl Glycol-Propyl Glycol (SYSTANE OP) Place 1 drop into both eyes daily.   rosuvastatin (CRESTOR) 20 MG tablet Take 1 tablet (20 mg total) by mouth at bedtime.   vitamin B-12 (CYANOCOBALAMIN) 1000 MCG tablet Take 1,000 mcg by mouth daily.   zinc gluconate 50 MG tablet Take 50 mg by mouth daily.   Past Medical History:  Diagnosis Date   Blood transfusion without reported diagnosis    Chronic right ear pain    normal MRI   Esophageal stricture    Family history of breast cancer    Family history of colon cancer    Family  history of melanoma    Family history of prostate cancer    Fibroids    uterine, history of   GERD (gastroesophageal reflux disease)    History of uterine prolapse    Hx of adenomatous polyp of colon 10/19/2014   Hyperlipidemia    Obesity    PVC (premature ventricular contraction)    first dx by holter in 1980's, echo (6/10) EF 70-26%, normal diastolic fxn, normal size RV and fxn, mild MR, PASP 54mHg   Tubal pregnancy    Ulcerative proctosigmoiditis ([facility]    Urge urinary incontinence 06/03/2007   Qualifier: Diagnosis of  By: BMarcelino ScotCMA, AAuburn Bilberry    Past Surgical History:  Procedure Laterality Date   BLADDER  NECK SUSPENSION  1998   BREAST BIOPSY Left    CATARACT EXTRACTION Right 01/2020   CATARACT EXTRACTION W/PHACO Left 08/12/2020   Procedure: CATARACT EXTRACTION PHACO AND INTRAOCULAR LENS PLACEMENT LEFT EYE;  Surgeon: WBaruch Goldmann MD;  Location: [facility];  Service: Ophthalmology;  Laterality: Left;  left CDE=10.71   COLONOSCOPY  05/18/2011/10/12/14   Dr. CSilvano Rusk  ECTOPIC PREGNANCY SURGERY  1980   ESOPHAGOGASTRODUODENOSCOPY (EGD) WITH ESOPHAGEAL DILATION  04/03/12   Dr. 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)   Grown children   Never smoker no alcohol tobacco or drug use   family history includes Arrhythmia in her brother; Barrett's esophagus in her maternal aunt and mother; Breast cancer in her sister; Colon cancer in her cousin and another family member; Colon cancer (age of onset: 32 in her son; Colon cancer (age of onset: 321 in her brother; Colon polyps in her sister; Congestive Heart Failure in her maternal grandmother; Coronary artery disease in her mother; Diabetes in her mother; Diabetes type II in her mother; Ehlers-Danlos syndrome in her daughter; Heart attack (age of onset: 761 in her mother; Melanoma in her maternal uncle; Other in her son; Prostate cancer in her maternal grandfather; Skin cancer in her maternal aunt and mother; Sudden death in her mother; Sudden death (age of onset: 469 in her father; Transient ischemic attack in her mother; Tuberculosis in her paternal grandfather.   Review of Systems As per age PI  Objective:   Physical Exam BP 128/82   Pulse 77   Ht 5' 1"  (1.549 m)   Wt 193 lb (87.5 kg)   LMP 05/29/1996   BMI 36.47 kg/m

## 2021-12-29 ENCOUNTER — Encounter: Payer: Self-pay | Admitting: Internal Medicine

## 2022-01-01 ENCOUNTER — Telehealth: Payer: Self-pay | Admitting: Family Medicine

## 2022-01-01 NOTE — Telephone Encounter (Signed)
[facility] - Client TELEPHONE ADVICE RECORD [facility] Patient Name: Rachel Carlson Gender: Female DOB: Sep 28, 1945 Age: 76 Y 14 D Return Phone Number: [phone] (Primary) Address: City/ State/ Zip: [place] [address] Client [facility] - Client Client Site [facility] Provider [place], Roque Lias - MD Contact Type Call Who Is Calling Patient / Member / Family / Caregiver Call Type Triage / Clinical Relationship To Patient Self Return Phone Number [phone] (Primary) Chief Complaint Dizziness Reason for Call Symptomatic / Request for [place] [place] states she is dizzy and has been for about a week. Caller states she feels sick as well. Caller states she thinks she has vertigo. Translation No Nurse Assessment Nurse: Kirk Ruths, RN, [facility] Date/Time Eilene Ghazi Time): 01/01/2022 12:02:40 PM Confirm and document reason for call. If symptomatic, describe symptoms. ---Caller states she has been dizzy for about a week feels nauseated when the verigo is happening she also has lightheadedness no actual vomiting no fever no cold symptoms no ear pain her ears sometimes get stopped up Does the patient have any new or worsening symptoms? ---Yes Will a triage be completed? ---Yes Related visit to physician within the last 2 weeks? ---No Does the PT have any chronic conditions? (i.e. diabetes, asthma, this includes High risk factors for pregnancy, etc.) ---Yes List chronic conditions. ---uc dholesterol Is this a behavioral health or substance abuse call? ---No Guidelines Guideline Title Affirmed Question Affirmed Notes Nurse Date/Time (Eastern Time) Dizziness - Vertigo SEVERE dizziness (vertigo) (e.g., unable to walk without assistance) Kirk Ruths, RN, [facility] 01/01/2022 12:05:35 PM Disp. Time Eilene Ghazi Time) Disposition Final User 01/01/2022 11:40:43 AM Attempt made -  line busy Dalton, RN, [facility] PLEASE NOTE: All timestamps contained within this report are represented as [place] Standard Time. CONFIDENTIALTY NOTICE: This fax transmission is intended only for the addressee. It contains information that is legally privileged, confidential or otherwise protected from use or disclosure. If you are not the intended recipient, you are strictly prohibited from reviewing, disclosing, copying using or disseminating any of this information or taking any action in reliance on or regarding this information. If you have received this fax in error, please notify us immediately by telephone so that we can arrange for its return to [place]. Phone: [phone], Toll-Free: [phone], Fax: [phone] Page: 2 of 2 Call Id: [id number] [place]. Time Eilene Ghazi Time) Disposition Final User 01/01/2022 12:00:10 PM Send To RN Personal Nicole Cella, RN, [place] 01/01/2022 12:10:28 PM Go to ED Now (or PCP triage) Yes Kirk Ruths, RN, [facility] Final Disposition 01/01/2022 12:10:28 PM Go to ED Now (or PCP triage) Yes Kirk Ruths, RN, Liana Gerold Disagree/Comply Disagree Caller Understands Yes PreDisposition Call Doctor Care Advice Given Per Guideline GO TO ED NOW (OR PCP TRIAGE): NOTE TO TRIAGER - DRIVING: * Another adult should drive. * Patient should not delay going to the emergency department. * If immediate transportation is not available via car, rideshare (e.g., Lyft, Uber), or taxi, then the patient should be instructed to call [id number]. NOTE TO TRIAGER - AMBULANCE TRANSPORT: * If the patient cannot walk at all because of severe dizziness, then the patient may need to be transported via ambulance. * The patient or family members can arrange ambulance transport via private ambulance company or via EMS 911. CARE ADVICE given per Dizziness - Vertigo (Adult) guideline. * IF NO PCP (PRIMARY CARE PROVIDER) SECOND-LEVEL TRIAGE: You need to be seen within the next hour. Go to the ED/UCC at _____________  [place] as soon as you can. Referrals GO TO FACILITY REFUSE

## 2022-01-01 NOTE — Telephone Encounter (Signed)
I spoke with pt; pt said she already has appt with Dr Glori Bickers on 01/03/22 and I advised pt could schedule appt with a different provider on 01/02/22 and pt said no she has a repairman coming on 01/02/22 and will keep appt with Dr Glori Bickers; pt said the vertigo and nausea is better; pt is still slightly lightheaded but pt is getting up and moving slowly if lightheaded. UC & ED precautions given and pt voiced understanding. Sending note to Dr Glori Bickers and [employer]. Pt was appreciative of call.

## 2022-01-01 NOTE — Telephone Encounter (Signed)
Agree with advisement  I will see her then

## 2022-01-01 NOTE — Telephone Encounter (Signed)
Patient called and stated she has been experiencing lightheadedness for a week non stop and vertigo and nausea for a week off and on. Patient was sent to to access nurse.

## 2022-01-03 ENCOUNTER — Encounter: Payer: Self-pay | Admitting: Family Medicine

## 2022-01-03 ENCOUNTER — Ambulatory Visit: Payer: Medicare Other | Admitting: Family Medicine

## 2022-01-03 VITALS — BP 124/80 | HR 69 | Temp 97.8°F | Ht 61.0 in | Wt 191.2 lb

## 2022-01-03 DIAGNOSIS — H8110 Benign paroxysmal vertigo, unspecified ear: Secondary | ICD-10-CM | POA: Diagnosis not present

## 2022-01-03 MED ORDER — MECLIZINE HCL 25 MG PO TABS: 25.0000 mg | ORAL_TABLET | Freq: Three times a day (TID) | ORAL | 0 refills | Status: DC | PRN

## 2022-01-03 NOTE — Progress Notes (Signed)
Subjective:    Patient ID: Rachel Carlson, female    DOB: [birth year], 76 y.o.   MRN: [id number]  HPI Pt presents for vertigo /nausea   Wt Readings from Last 3 Encounters:  01/03/22 191 lb 3.2 oz (86.7 kg)  12/27/21 193 lb (87.5 kg)  10/19/21 189 lb (85.7 kg)   36.13 kg/m   2 episodes in the past 10 days   Spinning feeling  Nausea  Worse when she moves her head  Worse when first waking up   Is very prone to motion sickness  Driving Even getting in an elevator can get dizzy  Worse with age   Today symptoms are improved  Fog is not as bad   No otc meds for this   No recent travel or altitude change   No ear symptoms  In past occ she has to pop her ears  No headache No sinus changes    Patient Active Problem List   Diagnosis Date Noted   Vertigo, benign positional 01/03/2022   History of vaginal bleeding 08/17/2021   Visit for routine gyn exam 08/17/2021   Intertrigo 08/17/2021   Nonrheumatic mitral valve regurgitation 04/16/2019   Essential hypertension 04/11/2018   PVC's (premature ventricular contractions) 04/11/2018   Class 2 severe obesity with serious comorbidity and body mass index (BMI) of 36.0 to 36.9 in adult [facility]) 12/06/2017   Genetic testing 09/26/2017   Family history of breast cancer    Family history of colon cancer    Family history of prostate cancer    Family history of melanoma    Family history of Lynch syndrome - son 08/04/2017   Prediabetes 07/20/2015   Estrogen deficiency 04/27/2015   GERD (gastroesophageal reflux disease) 03/23/2015   Hx of adenomatous polyp of colon 10/19/2014   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[place]) 99/83/3825   Pain in joint, pelvic region and thigh 07/24/2014   Need for hepatitis C screening test 07/12/2014   Pelvic pain in female 07/12/2014   Vitamin D deficiency 07/03/2014   Encounter for Medicare annual wellness exam 07/08/2013   Osteoporosis 05/24/2013   Varicose veins of  leg with pain 07/03/2011   Detrusor muscle hypertonia 11/15/2010   Adnexal pain 11/15/2010   Urge incontinence 11/15/2010   Family history of coronary artery disease 10/15/2010   Routine general medical examination at a health care facility 06/01/2010   FASCIITIS, PLANTAR 06/06/2009   Hyperlipidemia 06/03/2007   Urge urinary incontinence 06/03/2007   MIGRAINES, HX OF 06/03/2007   Past Medical History:  Diagnosis Date   Blood transfusion without reported diagnosis    Chronic right ear pain    normal MRI   Esophageal stricture    Family history of breast cancer    Family history of colon cancer    Family history of melanoma    Family history of prostate cancer    Fibroids    uterine, history of   GERD (gastroesophageal reflux disease)    History of uterine prolapse    Hx of adenomatous polyp of colon 10/19/2014   Hyperlipidemia    Obesity    PVC (premature ventricular contraction)    first dx by holter in 1980's, echo (6/10) EF 05-39%, normal diastolic fxn, normal size RV and fxn, mild MR, PASP 68mHg   Tubal pregnancy    [place]    Urge urinary incontinence 06/03/2007   Qualifier: Diagnosis of  By: BMarcelino ScotCMA, AAuburn Bilberry    Past Surgical History:  Procedure Laterality Date   BLADDER NECK SUSPENSION  1998   BREAST BIOPSY Left    CATARACT EXTRACTION Right 01/2020   CATARACT EXTRACTION W/PHACO Left 08/12/2020   Procedure: CATARACT EXTRACTION PHACO AND INTRAOCULAR LENS PLACEMENT LEFT EYE;  Surgeon: Baruch Goldmann, MD;  Location: [facility];  Service: Ophthalmology;  Laterality: Left;  left CDE=10.71   COLONOSCOPY  05/18/2011/10/12/14   Dr. Silvano Rusk   ECTOPIC PREGNANCY SURGERY  1980   ESOPHAGOGASTRODUODENOSCOPY (EGD) WITH ESOPHAGEAL DILATION  04/03/12   Dr. Silvano Rusk   PARTIAL HYSTERECTOMY     abdominal, fibroids, and prolapse (1998) bladder tack   UPPER GASTROINTESTINAL ENDOSCOPY     Social History   Tobacco Use   Smoking status: Never    Smokeless tobacco: Never  Vaping Use   Vaping Use: Never used  Substance Use Topics   Alcohol use: No    Alcohol/week: 0.0 standard drinks of alcohol    Comment: rarely beer   Drug use: No   Family History  Problem Relation Age of Onset   Sudden death Father 30       "coronary arteriosclerosis" on death certificate   Heart attack Mother 71   Transient ischemic attack Mother    Diabetes type II Mother    Sudden death Mother        died age 63   Diabetes Mother    Coronary artery disease Mother    Skin cancer Mother    Barrett's esophagus Mother    Arrhythmia Brother    Colon cancer Brother 49   Breast cancer Sister        dx in her 51s   Colon polyps Sister        adenomatous   Colon cancer Son 37       lynch syndrome   Melanoma Maternal Uncle    Congestive Heart Failure Maternal Grandmother    Prostate cancer Maternal Grandfather        dx in 64s   Tuberculosis Paternal Grandfather    Skin cancer Maternal Aunt    Barrett's esophagus Maternal Aunt    Other Son        parotid gland tumor   Ehlers-Danlos syndrome Daughter    Colon cancer Cousin        mid 39s; maternal cousin   Colon cancer Other        MGMs brother   Esophageal cancer Neg Hx    Stomach cancer Neg Hx    Rectal cancer Neg Hx    Allergies  Allergen Reactions   Pantoprazole Sodium Diarrhea    Abdominal pain   Ranitidine Diarrhea    abdominal pain   Omeprazole Diarrhea    Abdominal pain   Sulfasalazine Rash    Fever, chills, headache, muscle pain   Current Outpatient Medications on File Prior to Visit  Medication Sig Dispense Refill   Ascorbic Acid (VITAMIN C) 1000 MG tablet Take 1,000 mg by mouth daily.     aspirin 81 MG tablet Take 81 mg by mouth daily.       calcium citrate-vitamin D (CITRACAL+D) 315-200 MG-UNIT per tablet Take 1 tablet by mouth 2 (two) times daily.     Cholecalciferol (VITAMIN D-3) 5000 UNITS TABS Take 5,000 Units by mouth daily.     Coenzyme Q10 (CO Q-10) 200 MG CAPS  Take 200 mg by mouth daily.     CRANBERRY PO Take 650 mg by mouth daily.     estradiol (ESTRACE) 0.1 MG/GM vaginal cream Place  0.5 g vaginally 2 (two) times a week. Place 0.5g nightly for two weeks then twice a week after 30 g 11   Glucosamine-Chondroitin (GLUCOSAMINE CHONDR COMPLEX PO) Take 2 capsules by mouth daily.      GRAPE SEED EXTRACT PO Take 1 capsule by mouth daily.     hydrocortisone (ANUSOL-HC) 2.5 % rectal cream Place 1 Application rectally 2 (two) times daily. 30 g 1   hydrocortisone (CORTENEMA) 100 MG/60ML enema Use 42m every night for 28 days 1680 mL 0   ketoconazole (NIZORAL) 2 % cream Apply 1 Application topically daily. To yeast rash 30 g 1   lansoprazole (PREVACID) 15 MG capsule Take 1 capsule (15 mg total) by mouth 2 (two) times daily before a meal. (Patient taking differently: Take 15 mg by mouth daily.)     LIALDA 1.2 g EC tablet Take 2 tablets by mouth twice daily 120 tablet 3   milk thistle 175 MG tablet Take 175 mg by mouth daily.     Omega-3 Fatty Acids (FISH OIL TRIPLE STRENGTH PO) Take 1 capsule by mouth daily.     Polyethyl Glycol-Propyl Glycol (SYSTANE OP) Place 1 drop into both eyes daily.     rosuvastatin (CRESTOR) 20 MG tablet Take 1 tablet (20 mg total) by mouth at bedtime. 90 tablet 3   vitamin B-12 (CYANOCOBALAMIN) 1000 MCG tablet Take 1,000 mcg by mouth daily.     zinc gluconate 50 MG tablet Take 50 mg by mouth daily.     No current facility-administered medications on file prior to visit.     Review of Systems  Constitutional:  Negative for activity change, appetite change, fatigue, fever and unexpected weight change.  HENT:  Negative for congestion, ear pain, rhinorrhea, sinus pressure and sore throat.   Eyes:  Negative for pain, redness and visual disturbance.  Respiratory:  Negative for cough, shortness of breath and wheezing.   Cardiovascular:  Negative for chest pain and palpitations.  Gastrointestinal:  Negative for abdominal pain, blood in  stool, constipation and diarrhea.  Endocrine: Negative for polydipsia and polyuria.  Genitourinary:  Negative for dysuria, frequency and urgency.  Musculoskeletal:  Negative for arthralgias, back pain and myalgias.  Skin:  Negative for pallor and rash.  Allergic/Immunologic: Negative for environmental allergies.  Neurological:  Positive for dizziness and light-headedness. Negative for tremors, seizures, syncope, facial asymmetry, speech difficulty, weakness, numbness and headaches.  Hematological:  Negative for adenopathy. Does not bruise/bleed easily.  Psychiatric/Behavioral:  Negative for decreased concentration and dysphoric mood. The patient is not nervous/anxious.        Objective:   Physical Exam Constitutional:      General: She is not in acute distress.    Appearance: Normal appearance. She is well-developed. She is obese. She is not ill-appearing or diaphoretic.  HENT:     Head: Normocephalic and atraumatic.     Right Ear: External ear normal.     Left Ear: External ear normal.     Nose: Nose normal.     Mouth/Throat:     Pharynx: No oropharyngeal exudate.  Eyes:     General: No scleral icterus.       Right eye: No discharge.        Left eye: No discharge.     Conjunctiva/sclera: Conjunctivae normal.     Pupils: Pupils are equal, round, and reactive to light.     Comments: No nystagmus  Neck:     Thyroid: No thyromegaly.     Vascular: No carotid  bruit or JVD.     Trachea: No tracheal deviation.  Cardiovascular:     Rate and Rhythm: Normal rate and regular rhythm.     Heart sounds: Normal heart sounds. No murmur heard. Pulmonary:     Effort: Pulmonary effort is normal. No respiratory distress.     Breath sounds: Normal breath sounds. No wheezing or rales.  Abdominal:     General: Bowel sounds are normal. There is no distension.     Palpations: Abdomen is soft. There is no mass.     Tenderness: There is no abdominal tenderness.  Musculoskeletal:        General: No  tenderness.     Cervical back: Full passive range of motion without pain, normal range of motion and neck supple.  Lymphadenopathy:     Cervical: No cervical adenopathy.  Skin:    General: Skin is warm and dry.     Coloration: Skin is not pale.     Findings: No rash.  Neurological:     Mental Status: She is alert and oriented to person, place, and time.     Cranial Nerves: No cranial nerve deficit, dysarthria or facial asymmetry.     Sensory: Sensation is intact. No sensory deficit.     Motor: Motor function is intact. No weakness, tremor, atrophy, abnormal muscle tone, seizure activity or pronator drift.     Coordination: Romberg sign negative. Coordination normal. Rapid alternating movements normal.     Gait: Gait and tandem walk normal.     Deep Tendon Reflexes: Reflexes are normal and symmetric. Reflexes normal.     Comments: No focal cerebellar signs   Psychiatric:        Behavior: Behavior normal.        Thought Content: Thought content normal.           Assessment & Plan:   Problem List Items Addressed This Visit       Nervous and Auditory   Vertigo, benign positional - Primary    Exam and hx consistent with BPV Improved now but not gone  Reassuring exam/ no focal neuro changes  Disc use of meclizine prn if worse -px to have on hand  Given handout re: condition and the epley maneuver  If this worsens consider ENT ref   Inst to change pos slowly esp if she has been still (in bed)  Fall precautions Stay hydrated Update if not starting to improve in a week or if worsening

## 2022-01-03 NOTE — Patient Instructions (Signed)
If symptoms suddenly worsen let us know   If severe dizziness or headache go to ER   Have meclizine on hand - take it for increased dizziness as needed /it will sedate   Take a look at the handouts  The epley maneuver is something to consider if needed   Do not change position slowly  Take it very slowly to sit up in bed or turn over in bed   Update if not starting to improve in a week or if worsening

## 2022-01-03 NOTE — Assessment & Plan Note (Signed)
Exam and hx consistent with BPV Improved now but not gone  Reassuring exam/ no focal neuro changes  Disc use of meclizine prn if worse -px to have on hand  Given handout re: condition and the epley maneuver  If this worsens consider ENT ref   Inst to change pos slowly esp if she has been still (in bed)  Fall precautions Stay hydrated Update if not starting to improve in a week or if worsening

## 2022-01-04 NOTE — Progress Notes (Signed)
[employer] was contacted to submit a PA request for Botox and Urethral Bulking.  PA was APPROVED FROM 01/03/2022-01/05/2023. Botox approval allows 100u every 12 weeks for 1 year totaling 4 visits. Auth #: [id number]

## 2022-01-12 ENCOUNTER — Ambulatory Visit: Payer: Medicare Other

## 2022-01-15 NOTE — Progress Notes (Signed)
Rachel Carlson is a 77 y.o. female here for a self cath teaching. Pt was cleaned with betadine. Self cathing instructions were demonstrated with 66fcatheter. Pt was able to cath herself for 75cc of urine.  Cath instructions were given and extra 125fatheters.

## 2022-01-18 ENCOUNTER — Encounter: Payer: Self-pay | Admitting: Obstetrics and Gynecology

## 2022-01-18 ENCOUNTER — Ambulatory Visit (INDEPENDENT_AMBULATORY_CARE_PROVIDER_SITE_OTHER): Payer: Medicare Other | Admitting: Obstetrics and Gynecology

## 2022-01-18 ENCOUNTER — Ambulatory Visit: Payer: Medicare Other

## 2022-01-18 VITALS — BP 144/83 | HR 86

## 2022-01-18 DIAGNOSIS — N952 Postmenopausal atrophic vaginitis: Secondary | ICD-10-CM | POA: Diagnosis not present

## 2022-01-18 DIAGNOSIS — R350 Frequency of micturition: Secondary | ICD-10-CM | POA: Diagnosis not present

## 2022-01-18 DIAGNOSIS — N3281 Overactive bladder: Secondary | ICD-10-CM | POA: Diagnosis not present

## 2022-01-18 LAB — POCT URINALYSIS DIPSTICK
Bilirubin, UA: NEGATIVE
Glucose, UA: NEGATIVE
Ketones, UA: NEGATIVE
Leukocytes, UA: NEGATIVE
Nitrite, UA: NEGATIVE
Protein, UA: POSITIVE — AB
Spec Grav, UA: >=1.03 — AB (ref 1.010–1.025)
Urobilinogen, UA: 0.2 E.U./dL
pH, UA: 5.5 (ref 5.0–8.0)

## 2022-01-18 MED ORDER — ONABOTULINUMTOXINA 100 UNITS IJ SOLR
100.0000 [IU] | Freq: Once | INTRAMUSCULAR | Status: AC
  Administered 2022-01-18: 100 [IU] via INTRAMUSCULAR

## 2022-01-18 MED ORDER — LIDOCAINE HCL URETHRAL/MUCOSAL 2 % EX GEL
1.0000 | Freq: Once | CUTANEOUS | Status: AC
  Administered 2022-01-18: 1 via URETHRAL

## 2022-01-18 MED ORDER — LIDOCAINE HCL 2 % IJ SOLN
40.0000 mL | Freq: Once | INTRAMUSCULAR | Status: AC
  Administered 2022-01-18: 800 mg

## 2022-01-18 MED ORDER — ESTRADIOL 0.1 MG/GM VA CREA: 0.5000 g | TOPICAL_CREAM | VAGINAL | 11 refills | Status: DC

## 2022-01-18 MED ORDER — CIPROFLOXACIN HCL 500 MG PO TABS
500.0000 mg | ORAL_TABLET | Freq: Once | ORAL | Status: AC
  Administered 2022-01-18: 500 mg via ORAL

## 2022-01-18 NOTE — Progress Notes (Signed)
Urogynecology Return Visit  SUBJECTIVE  History of Present Illness: Rachel Carlson is a 76 y.o. female seen in follow-up for mixed incontinence. Has more urgency. Trospium improved her symptoms but had a lot of constipation.   Has previously tried pessary, and other medications- mybetriq and gemtesa.    Here today for evaluation of SUI with simple CMG to see if she is a candidate for urethral bulking. Also planning on intravesical botox for OAB.   She also needs a refill of her vaginal estrogen cream- she states the pharmacy will only give it to her once every 3 months and has not been able to get a prescription recently.   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   Past Surgical History: She  has a past surgical history that includes Ectopic pregnancy surgery (1980); Partial hysterectomy; Bladder neck suspension (1998); Esophagogastroduodenoscopy (egd) with esophageal dilation (04/03/12); Upper gastrointestinal endoscopy; Breast biopsy (Left); Colonoscopy (05/18/2011/10/12/14); Cataract extraction (Right, 01/2020); and Cataract extraction w/PHACO (Left, 08/12/2020).   Medications: She has a current medication list which includes the following prescription(s): vitamin c, aspirin, calcium citrate-vitamin d, vitamin d-3, co q-10, cranberry, glucosamine-chondroitin, grape seed, hydrocortisone, hydrocortisone, ketoconazole, lansoprazole, lialda, meclizine, milk thistle,  omega-3 fatty acids, polyethyl glycol-propyl glycol, rosuvastatin, cyanocobalamin, zinc gluconate, and estradiol, and the following Facility-Administered Medications: botulinum toxin type a, ciprofloxacin, lidocaine, and lidocaine.   Allergies: Patient is allergic to pantoprazole sodium, ranitidine, omeprazole, and sulfasalazine.   Social History: Patient  reports that she has never smoked. She has never used smokeless tobacco. She reports that she does not drink alcohol and does not use drugs.      OBJECTIVE     Physical Exam: Vitals:   01/18/22 1059  BP: (!) 144/83  Pulse: 86   Gen: No apparent distress, A&O x 3.  simple CMG procedure:   Prolapse was reduced using 2 large cotton swabs. Urethra was prepped with betadine and a 61F catheter was placed and bladder was drained completely. The bladder was then backfilled with sterile water by gravity.  First sensation: 69m First Desire: 762mStrong Desire: 10054mAt this point, patient had large DO and leakage around the catheter. Attempted to empty then start refilling but patient could only tolerate approximately 50cc before large DO. Unable to assess for stress incontinence.    ASSESSMENT AND PLAN    [name] a 76 87o. with:  1. Overactive bladder   2. Urinary frequency   3. Vaginal atrophy    - Since we are unable to assess for SUI due to DO and limited bladder capacity, will only proceed with intravesical botox.  - - For refractory OAB we reviewed the procedure for intravesical Botox injection with cystoscopy in the office and reviewed the risks, benefits and alternatives of treatment including but not limited to infection, need for self-catheterization and need for repeat therapy.   - We discussed that there is a 5-15% chance of needing to catheterize with Botox and that this usually resolves in a few months; however can persist for longer periods of time.  She has had self cath teaching.  -  Typically Botox  injections would need to be repeated every 3-12 months since this is not a permanent therapy.  - See separate procedure noted.   - Estrace cream reordered.   [name], MD  Time spent: I spent 15 minutes dedicated to the care of this patient on the date of this encounter to include pre-visit review of records, face-to-face time with the patient and post visit documentation and ordering medication/ testing.

## 2022-01-18 NOTE — Patient Instructions (Signed)

## 2022-01-18 NOTE — Progress Notes (Signed)
   Intravesical Botox Procedure:  Prior to the procedure, the patient took ciprofloxacin for antibiotic prophylaxis. Time out was performed. The bladder was catherized and 40 ml of 2% lidocaine was placed in the bladder and 10 ml of 2% lidocaine jelly placed in the urethra.  Cystoscopy was performed with sterile H2O and a 70 degree scope. Bladder mucosa was noted to be within normal limits. A total of 10 ml / 100 units of Botox A,  Lot # [id number] 11/2023  was injected in the detrusor muscle via 5 injections of 64m each. These were spaced about 1 cm apart. Care was taken to avoid the ureteral orifices and the trigone. The bladder was drained and cystoscope removed. Patient tolerated the procedure well.  Impression: 76y.o. 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 Patient tolerated procedure well.  Plan: Post-procedure instructions given regarding bleeding, infection, urinary retention.  Self-catheterization teaching was previously performed.   Patient will follow up in 4 weeks All questions answered.  MJaquita Folds MD

## 2022-02-14 DIAGNOSIS — L821 Other seborrheic keratosis: Secondary | ICD-10-CM | POA: Diagnosis not present

## 2022-02-14 DIAGNOSIS — D235 Other benign neoplasm of skin of trunk: Secondary | ICD-10-CM | POA: Diagnosis not present

## 2022-02-14 DIAGNOSIS — D216 Benign neoplasm of connective and other soft tissue of trunk, unspecified: Secondary | ICD-10-CM | POA: Diagnosis not present

## 2022-02-14 DIAGNOSIS — C44629 Squamous cell carcinoma of skin of left upper limb, including shoulder: Secondary | ICD-10-CM | POA: Diagnosis not present

## 2022-02-21 ENCOUNTER — Encounter: Payer: Self-pay | Admitting: Obstetrics and Gynecology

## 2022-02-21 ENCOUNTER — Ambulatory Visit (INDEPENDENT_AMBULATORY_CARE_PROVIDER_SITE_OTHER): Payer: Medicare Other | Admitting: Obstetrics and Gynecology

## 2022-02-21 VITALS — BP 143/72 | HR 106

## 2022-02-21 DIAGNOSIS — N3281 Overactive bladder: Secondary | ICD-10-CM

## 2022-02-21 NOTE — Progress Notes (Signed)
[facility] Return Visit  SUBJECTIVE  History of Present Illness: Rachel Carlson is a 77 y.o. female seen in follow-up for overactive bladder.   Has noticed some decreased urgency and frequency. She is able to make it to the toilet without leakage. She is not having any leakage. But recently has noticed a bit more urgency to urinate.  Also denies leakage with cough or sneeze.   She has been avoiding bladder irritants overall, but will have a cup of coffee in AM.    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   Past Surgical History: She  has a past surgical history that includes Ectopic pregnancy surgery (1980); Partial hysterectomy; Bladder neck suspension (1998); Esophagogastroduodenoscopy (egd) with esophageal dilation (04/03/12); Upper gastrointestinal endoscopy; Breast biopsy (Left); Colonoscopy (05/18/2011/10/12/14); Cataract extraction (Right, 01/2020); and Cataract extraction w/PHACO (Left, 08/12/2020).   Medications: She has a current medication list which includes the following prescription(s): vitamin c, aspirin, calcium citrate-vitamin d, vitamin d-3, co q-10, cranberry, estradiol, glucosamine-chondroitin, grape seed, hydrocortisone, hydrocortisone, ketoconazole, lansoprazole, lialda, meclizine, milk thistle, omega-3 fatty acids, polyethyl glycol-propyl glycol, rosuvastatin, cyanocobalamin, and zinc gluconate.   Allergies: Patient is allergic to pantoprazole  sodium, ranitidine, omeprazole, and sulfasalazine.   Social History: Patient  reports that she has never smoked. She has never used smokeless tobacco. She reports that she does not drink alcohol and does not use drugs.      OBJECTIVE     Physical Exam: Vitals:   02/21/22 1540  BP: (!) 143/72  Pulse: (!) 106   Gen: No apparent distress, A&O x 3.  Detailed Urogynecologic Evaluation:  Deferred.    ASSESSMENT AND PLAN    Ms. Labarbera is a 77 y.o. with:  1. Overactive bladder    - Very good result overall with the botox but she is concerned that since she has had return of some of the urgency that it will not last long.  - We discussed that the earliest we can redose the botox is 12 weeks. So will have her return around 10 weeks and reassess her symptoms.   [name], MD  Time spent: I spent 20 minutes dedicated to the care of this patient on the date of this encounter to include pre-visit review of records, face-to-face time with the patient and post visit documentation.

## 2022-02-28 ENCOUNTER — Other Ambulatory Visit: Payer: Self-pay | Admitting: Obstetrics and Gynecology

## 2022-02-28 ENCOUNTER — Telehealth: Payer: Self-pay

## 2022-02-28 DIAGNOSIS — I863 Vulval varices: Secondary | ICD-10-CM

## 2022-02-28 NOTE — Telephone Encounter (Signed)
Rachel Carlson is a 77 y.o. 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  Pt said she was told  she needs a referral to: [facility] IR dept- Dr Laural Roes.  Ph # is [phone]

## 2022-03-19 DIAGNOSIS — Z08 Encounter for follow-up examination after completed treatment for malignant neoplasm: Secondary | ICD-10-CM | POA: Diagnosis not present

## 2022-03-19 DIAGNOSIS — Z85828 Personal history of other malignant neoplasm of skin: Secondary | ICD-10-CM | POA: Diagnosis not present

## 2022-03-20 DIAGNOSIS — L821 Other seborrheic keratosis: Secondary | ICD-10-CM | POA: Diagnosis not present

## 2022-03-20 DIAGNOSIS — L720 Epidermal cyst: Secondary | ICD-10-CM | POA: Diagnosis not present

## 2022-03-20 DIAGNOSIS — D2372 Other benign neoplasm of skin of left lower limb, including hip: Secondary | ICD-10-CM | POA: Diagnosis not present

## 2022-03-20 DIAGNOSIS — D485 Neoplasm of uncertain behavior of skin: Secondary | ICD-10-CM | POA: Diagnosis not present

## 2022-03-20 DIAGNOSIS — L814 Other melanin hyperpigmentation: Secondary | ICD-10-CM | POA: Diagnosis not present

## 2022-03-20 DIAGNOSIS — L570 Actinic keratosis: Secondary | ICD-10-CM | POA: Diagnosis not present

## 2022-03-20 DIAGNOSIS — L820 Inflamed seborrheic keratosis: Secondary | ICD-10-CM | POA: Diagnosis not present

## 2022-03-20 DIAGNOSIS — L74513 Primary focal hyperhidrosis, soles: Secondary | ICD-10-CM | POA: Diagnosis not present

## 2022-03-20 DIAGNOSIS — L74512 Primary focal hyperhidrosis, palms: Secondary | ICD-10-CM | POA: Diagnosis not present

## 2022-03-20 DIAGNOSIS — C44629 Squamous cell carcinoma of skin of left upper limb, including shoulder: Secondary | ICD-10-CM | POA: Diagnosis not present

## 2022-03-20 DIAGNOSIS — D225 Melanocytic nevi of trunk: Secondary | ICD-10-CM | POA: Diagnosis not present

## 2022-03-21 ENCOUNTER — Ambulatory Visit
Admission: RE | Admit: 2022-03-21 | Discharge: 2022-03-21 | Disposition: A | Discharge status: Home / Self Care | Payer: Medicare Other | Source: Ambulatory Visit | Attending: Obstetrics and Gynecology | Admitting: Obstetrics and Gynecology

## 2022-03-21 DIAGNOSIS — I863 Vulval varices: Secondary | ICD-10-CM

## 2022-03-21 DIAGNOSIS — I868 Varicose veins of other specified sites: Secondary | ICD-10-CM | POA: Diagnosis not present

## 2022-03-21 NOTE — Consult Note (Signed)
Chief Complaint: Patient was seen in consultation today for symptomatic labial varicosities at the request of [place]  Referring Physician(s): Schroeder,Michelle N  History of Present Illness: Rachel Carlson is a 77 y.o. female referred by Dr. Wannetta Sender for evaluation of treatment options for symptomatic labial varicosities.  The patient is status post remote hysterectomy and bladder neck suspension in 1998.  She had an initial bleeding episode from a superficial varicosity of the right labia last June followed by a repeat episode of bleeding last September.  These were both associated by fairly significant bleeding.  Just this past week, she had what she described as "dribbling" bleeding from the same varicosity for approximately 5 days which just stopped on Sunday.  Each time the bleeding has occurred, the superficial varicosity enlarges at the level of the skin to roughly the size of a pea then becomes decompressed after the bleeding episode.  The region of the varicosity is always painful to the touch and has only been on the right side.  Rachel Carlson has not had any prior varicose vein treatments of her lower extremities.  She does have some chronic spider veins of both lower extremities which are more prominent towards the ankles bilaterally.  This is associated with some mild chronic edema of both lower extremities for which she has periodically worn compression garments in the past. She has not had any other additional pelvic surgery.  She has a history of overactive bladder for which she is receiving periodic intravesical Botox treatment.  Past Medical History:  Diagnosis Date   Blood transfusion without reported diagnosis    Chronic right ear pain    normal MRI   Esophageal stricture    Family history of breast cancer    Family history of colon cancer    Family history of melanoma    Family history of prostate cancer    Fibroids    uterine, history of    GERD (gastroesophageal reflux disease)    History of uterine prolapse    Hx of adenomatous polyp of colon 10/19/2014   Hyperlipidemia    Obesity    PVC (premature ventricular contraction)    first dx by holter in 1980's, echo (6/10) EF 0000000, normal diastolic fxn, normal size RV and fxn, mild MR, PASP 10mHg   Tubal pregnancy    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 CSilvano Rusk  ECTOPIC PREGNANCY SURGERY  1980   ESOPHAGOGASTRODUODENOSCOPY (EGD) WITH ESOPHAGEAL DILATION  04/03/12   Dr. CSilvano Rusk  PARTIAL HYSTERECTOMY     abdominal, fibroids, and prolapse (1998) bladder tack   UPPER GASTROINTESTINAL ENDOSCOPY      Allergies: Pantoprazole sodium, Ranitidine, Omeprazole, and Sulfasalazine  Medications: Prior to Admission medications   Medication Sig Start Date End Date Taking? Authorizing Provider  Ascorbic Acid (VITAMIN C) 1000 MG tablet Take 1,000 mg by mouth daily.    [provider]  aspirin 81 MG tablet Take 81 mg  by mouth daily.      [provider]  calcium citrate-vitamin D (CITRACAL+D) 315-200 MG-UNIT per tablet Take 1 tablet by mouth 2 (two) times daily.    [provider]  Cholecalciferol (VITAMIN D-3) 5000 UNITS TABS Take 5,000 Units by mouth daily.    [provider]  Coenzyme Q10 (CO Q-10) 200 MG CAPS Take 200 mg by mouth daily.    [provider]  CRANBERRY PO Take 650 mg by mouth daily.    [provider]  estradiol (ESTRACE) 0.1 MG/GM vaginal cream Place 0.5 g vaginally 2 (two) times a week. 01/18/22   [name], MD  Glucosamine-Chondroitin (GLUCOSAMINE CHONDR COMPLEX PO) Take 2 capsules by mouth daily.     [provider]  GRAPE SEED EXTRACT PO Take 1 capsule by mouth daily.    [provider]  hydrocortisone (ANUSOL-HC) 2.5 % rectal cream Place 1 Application rectally 2 (two) times daily. 10/13/21   Gatha Mayer, MD  hydrocortisone [place]) 100 MG/60ML enema Use 24m every night for 28 days 10/20/21   LLevin Erp PA  ketoconazole (NIZORAL) 2 % cream Apply 1 Application topically daily. To yeast rash 08/17/21   Tower, MWynelle Fanny MD  lansoprazole (PREVACID) 15 MG capsule Take 1 capsule (15 mg total) by mouth 2 (two) times daily before a meal. Patient taking differently: Take 15 mg by mouth daily. 09/02/17   GGatha Mayer MD  LIALDA 1.2 g EC tablet Take 2 tablets by mouth twice daily 10/18/21   GGatha Mayer MD  meclizine (ANTIVERT) 25 MG tablet Take 1 tablet (25 mg total) by mouth 3 (three) times daily as needed for dizziness or nausea (vertigo). 01/03/22   Tower, MWynelle Fanny MD  milk thistle 175 MG tablet Take 175 mg by mouth daily.    [provider]  Omega-3 Fatty Acids (FISH OIL TRIPLE STRENGTH PO) Take 1 capsule by mouth daily.    [provider]  Polyethyl Glycol-Propyl Glycol (SYSTANE OP) Place 1 drop into both eyes daily.    [provider]  rosuvastatin (CRESTOR) 20 MG tablet Take 1 tablet (20 mg total) by mouth at bedtime. 08/17/21   Tower, MWynelle Fanny MD  vitamin B-12 (CYANOCOBALAMIN) 1000 MCG tablet Take 1,000 mcg by mouth daily.    [provider]  zinc gluconate 50 MG tablet Take 50 mg by mouth daily.    [provider]     Family History  Problem Relation Age of Onset   Sudden death Father 417      "coronary arteriosclerosis" on death certificate   Heart attack Mother 740  Transient ischemic  attack Mother    Diabetes type II Mother    Sudden death Mother        died age 75  Diabetes Mother    Coronary artery disease Mother    Skin cancer Mother    Barrett's esophagus Mother    Arrhythmia Brother    Colon cancer Brother 338  Breast cancer Sister        dx in her 642s  Colon polyps Sister        adenomatous   Colon cancer Son 33      lynch syndrome   Melanoma Maternal Uncle    Congestive Heart Failure Maternal Grandmother    Prostate cancer Maternal Grandfather        dx in 760s  Tuberculosis Paternal Grandfather  Skin cancer Maternal Aunt    Barrett's esophagus Maternal Aunt    Other Son        parotid gland tumor   Ehlers-Danlos syndrome Daughter    Colon cancer Cousin        mid 17s; maternal cousin   Colon cancer Other        MGMs brother   Esophageal cancer Neg Hx    Stomach cancer Neg Hx    Rectal cancer Neg Hx     Social History   Socioeconomic History   Marital status: Married    Spouse name: Not on file   Number of children: 3   Years of education: Not on file   Highest education level: Not on file  Occupational History   Occupation: Animal nutritionist    Comment:      Employer: unemployed  Tobacco Use   Smoking status: Never   Smokeless tobacco: Never  Vaping Use   Vaping Use: Never used  Substance and Sexual Activity   Alcohol use: No    Alcohol/week: 0.0 standard drinks of alcohol    Comment: rarely beer   Drug use: No   Sexual activity: Not Currently  Other Topics Concern   Not on file  Social History Narrative   Retired Animal nutritionist, 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)   Grown children   Never smoker no alcohol tobacco or drug use   Social Determinants of Health   Financial Resource Strain: Low Risk  (08/25/2021)   Overall Financial Resource Strain (CARDIA)    Difficulty of Paying Living Expenses: Not hard at all  Food Insecurity: No Food Insecurity (08/25/2021)   Hunger Vital Sign    Worried About Running Out of Food  in the Last Year: Never true    [place] in the Last Year: Never true  Transportation Needs: No Transportation Needs (08/25/2021)   PRAPARE - Hydrologist (Medical): No    Lack of Transportation (Non-Medical): No  Physical Activity: Inactive (08/25/2021)   Exercise Vital Sign    Days of Exercise per Week: 0 days    Minutes of Exercise per Session: 0 min  Stress: No Stress Concern Present (08/25/2021)   [place]    Feeling of Stress : Not at all  Social Connections: [place] (08/25/2021)   Social Connection and Isolation Panel [NHANES]    Frequency of Communication with Friends and Family: More than three times a week    Frequency of Social Gatherings with Friends and Family: More than three times a week    Attends Religious Services: More than 4 times per year    Active Member of [employer] or Organizations: Yes    Attends Music therapist: More than 4 times per year    Marital Status: Married    Review of Systems: A 12 point ROS discussed and pertinent positives are indicated in the HPI above.  All other systems are negative.  Review of Systems  Constitutional: Negative.   Respiratory: Negative.    Cardiovascular: Negative.   Gastrointestinal: Negative.   Genitourinary:  Positive for urgency.       Pain and bleeding associated with superficial varicosity of right labia.  Musculoskeletal: Negative.   Neurological: Negative.     Vital Signs: LMP 05/29/1996    Physical Exam Constitutional:      General: She is not in acute distress.    Appearance: Normal appearance.  She is not ill-appearing or toxic-appearing.  Genitourinary:    Comments: Palpable mildly protuberant superficial vein along inferior right labia without bleeding. No other protuberant labial varicosities identified. Musculoskeletal:     Comments: Prominent telangiectasias of both lower  extremities without significant palpable superficial varicosities. No chronic skin changes or ulcerations. Mild bilateral edema.  Neurological:     General: No focal deficit present.     Mental Status: She is alert and oriented to person, place, and time.      Imaging: US Venous Img Lower Bilateral (DVT)  Result Date: 03/21/2022 CLINICAL DATA:  Symptomatic and periodically bleeding superficial varicosity of the right labia. EXAM: BILATERAL LOWER EXTREMITY VENOUS DUPLEX ULTRASOUND TECHNIQUE: Gray-scale sonography with graded compression, as well as color Doppler and duplex ultrasound, were performed to evaluate the deep and superficial veins of both lower extremities. Spectral Doppler was utilized to evaluate flow at rest and with distal augmentation maneuvers. A complete superficial venous insufficiency exam was performed in the upright standing position. I personally performed the technical portion of the exam. COMPARISON:  None Available. FINDINGS: Initial sonographic evaluation was performed at the level directed by the patient a focal superficial varicosity along the inferior aspect of the right labia demonstrating a small patent superficial vein. This can be than traced via other communicating small superficial veins to a larger network superficial veins in the superior labial and vulvar region in the midline. Multiple patent branches than divergent to both the right and left of midline and ultimately communicate with saphenous branches as delineated below. Right lower extremity: The right great saphenous vein is patent in the proximal thigh. At the level of the saphenofemoral junction, there is no evidence of reflux with augmentation. The right GSV measures 6 mm in diameter in the proximal thigh. The GSV was also sampled throughout the proximal thigh and demonstrates no evidence reflux with augmentation. There is a small branch that continues superficially towards the midline that communicates with  the network of veins ultimately supplying the region of visualized vulvar and labial varicosities. Left lower extremity: The left great saphenous vein is patent in the proximal thigh. At the level of the saphenofemoral junction, there is no evidence of reflux with augmentation. The left GSV measures 7 mm in diameter in the proximal thigh. The GSV was also sampled throughout the proximal thigh and demonstrates no evidence reflux with augmentation. There is a small branch that continues superficially towards the midline that communicates with the network of veins ultimately supplying the region of visualized vulvar and labial varicosities. IMPRESSION: 1. The right and left great saphenous veins are patent in the proximal thighs and demonstrate no evidence of significant insufficiency. 2. Symptomatic and previously bleeding right labial varicosity communicates with a network of superficial varicosities of the vulvar region that also communicate bilaterally with the proximal thigh segments of the great saphenous veins. Electronically Signed   By: Aletta Edouard M.D.   On: 03/21/2022 13:15   [place] RAD EVAL AND MGMT  Result Date: 03/21/2022 Please refer to "Notes" to see consult details.   Labs:  CBC: Recent Labs    08/11/21 0939  WBC 4.2  HGB 13.6  HCT 40.2  PLT 190.0    COAGS: No results for input(s): "INR", "APTT" in the last 8760 hours.  BMP: Recent Labs    08/11/21 0939  NA 138  K 3.9  CL 106  CO2 25  GLUCOSE 105*  BUN 17  CALCIUM 9.7  CREATININE 0.70  LIVER FUNCTION TESTS: Recent Labs    08/11/21 0939  BILITOT 0.7  AST 17  ALT 16  ALKPHOS 55  PROT 7.2  ALBUMIN 4.6     Assessment and Plan:  The right labial varicosity that has now bled 3 times was not very distended today but is palpable and by ultrasound communicates with a network of prominent superficial veins that overlie the vulvar region and ultimately communicate with branches that empty into both proximal  great saphenous veins.  Neither great saphenous vein demonstrates insufficiency with reflux by duplex ultrasound performed today.  Therefore, she is not currently a candidate for any GSV interventions such as endovenous laser treatment.  However, given her prior bleeding episodes and symptoms of constant pain in the region of the right labial varicosity, she would benefit from image guided sclerotherapy under ultrasound guidance of the region of the bleeding varicosity as well as the communicating network of superficial varicosities in the labial/vulvar region.  After discussion of potential complications including skin ulceration or recanalization of varicosities over time, Mrs. Walson would like to proceed with treatment.  We will check with insurance for coverage of sclerotherapy treatment.  Thank you for this interesting consult.  I greatly enjoyed meeting CLARITZA SCHNARS and look forward to participating in their care.  A copy of this report was sent to the requesting provider on this date.  Electronically Signed: Azzie Roup 03/21/2022, 1:32 PM    I spent a total of 30 Minutes  in face to face in clinical consultation, greater than 50% of which was counseling/coordinating care for labial/vulvar varicosities.

## 2022-03-23 ENCOUNTER — Encounter (INDEPENDENT_AMBULATORY_CARE_PROVIDER_SITE_OTHER): Payer: Medicare Other | Admitting: Ophthalmology

## 2022-03-23 DIAGNOSIS — H43813 Vitreous degeneration, bilateral: Secondary | ICD-10-CM | POA: Diagnosis not present

## 2022-03-23 DIAGNOSIS — H35373 Puckering of macula, bilateral: Secondary | ICD-10-CM | POA: Diagnosis not present

## 2022-03-30 ENCOUNTER — Other Ambulatory Visit: Payer: Self-pay | Admitting: Interventional Radiology

## 2022-03-30 ENCOUNTER — Other Ambulatory Visit: Payer: Self-pay | Admitting: Family Medicine

## 2022-03-30 DIAGNOSIS — I863 Vulval varices: Secondary | ICD-10-CM

## 2022-03-30 DIAGNOSIS — Z1231 Encounter for screening mammogram for malignant neoplasm of breast: Secondary | ICD-10-CM

## 2022-04-06 ENCOUNTER — Ambulatory Visit (INDEPENDENT_AMBULATORY_CARE_PROVIDER_SITE_OTHER): Payer: Medicare Other | Admitting: Obstetrics and Gynecology

## 2022-04-06 ENCOUNTER — Encounter: Payer: Self-pay | Admitting: Obstetrics and Gynecology

## 2022-04-06 VITALS — BP 129/80 | HR 78

## 2022-04-06 DIAGNOSIS — N949 Unspecified condition associated with female genital organs and menstrual cycle: Secondary | ICD-10-CM | POA: Diagnosis not present

## 2022-04-06 DIAGNOSIS — N3281 Overactive bladder: Secondary | ICD-10-CM

## 2022-04-06 MED ORDER — LIDOCAINE-PRILOCAINE 2.5-2.5 % EX CREA: TOPICAL_CREAM | CUTANEOUS | 1 refills | Status: DC

## 2022-04-06 NOTE — Progress Notes (Signed)
[facility] Return Visit  SUBJECTIVE  History of Present Illness: Rachel Carlson is a 77 y.o. female seen in follow-up for overactive bladder.   She does have urgency which has been slightly increased but she is able to suppress it overall and is able to make it to the bathroom. Going to the grocery store has been easier for her as well.   She is using the estrogen cream twice a week. Does not seem to be correlated with coffee intake (has one cup in AM)- burning always at night. This is happening randomly. She is using the estrace cream twice a week. She tried azo but it did not help.    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   Past Surgical History: She  has a past surgical history that includes Ectopic pregnancy surgery (1980); Partial hysterectomy; Bladder neck suspension (1998); Esophagogastroduodenoscopy (egd) with esophageal dilation (04/03/12); Upper gastrointestinal endoscopy; Breast biopsy (Left); Colonoscopy (05/18/2011/10/12/14); Cataract extraction (Right, 01/2020); and Cataract extraction w/PHACO (Left, 08/12/2020).   Medications: She has a current medication list which includes the following prescription(s): lidocaine-prilocaine, vitamin c, aspirin, calcium citrate-vitamin d, vitamin d-3, co q-10, cranberry, estradiol, glucosamine-chondroitin, grape seed, hydrocortisone, hydrocortisone, ketoconazole, lansoprazole, lialda, meclizine, milk thistle, omega-3  fatty acids, polyethyl glycol-propyl glycol, rosuvastatin, cyanocobalamin, and zinc gluconate.   Allergies: Patient is allergic to pantoprazole sodium, ranitidine, omeprazole, and sulfasalazine.   Social History: Patient  reports that she has never smoked. She has never used smokeless tobacco. She reports that she does not drink alcohol and does not use drugs.      OBJECTIVE     Physical Exam: Vitals:   04/06/22 1023  BP: 129/80  Pulse: 78    Gen: No apparent distress, A&O x 3.  Detailed Urogynecologic Evaluation:  Deferred.    ASSESSMENT AND PLAN    [name] is a 77 y.o. with:  1. Overactive bladder   2. Vaginal burning    - Will plan for 150 units of botox at next injection- will aim for end of April since she is having slightly increased urgency.  We did discuss that does increase risk of urinary retention with increased dose. We also discussed that she will be charged for the full 200 units of botox.  - For occasional vaginal burning, will try emla cream which she can use as needed.   Follow up for intravesical botox   [name], MD

## 2022-04-10 NOTE — Progress Notes (Signed)
PA request was submitted through [facility] Provider Portal for procedure : Botox CPT code: [phone] and [id number] PA is needed for this procedure. PA request was: APPROVAL  Approval has been scanned to media

## 2022-04-13 ENCOUNTER — Ambulatory Visit (HOSPITAL_COMMUNITY)
Admission: RE | Admit: 2022-04-13 | Discharge: 2022-04-13 | Disposition: A | Discharge status: Home / Self Care | Payer: Medicare Other | Source: Ambulatory Visit | Attending: Pulmonary Disease | Admitting: Pulmonary Disease

## 2022-04-13 DIAGNOSIS — J8410 Pulmonary fibrosis, unspecified: Secondary | ICD-10-CM | POA: Insufficient documentation

## 2022-04-16 ENCOUNTER — Ambulatory Visit: Payer: Medicare Other | Admitting: Cardiology

## 2022-04-16 NOTE — Progress Notes (Unsigned)
Cardiology Office Note   Date:  04/17/2022   ID:  Rachel Carlson, DOB 10/10/1945, MRN [id number]  PCP:  Abner Greenspan, MD  Cardiologist:   Minus Breeding, MD     Chief Complaint  Patient presents with   PVCs      History of Present Illness: Rachel Carlson is a 77 y.o. female who presents for a history of palpitations and PVCs.  She was previously seen by Dr. Aundra Dubin.  She has a family history of sudden cardiac death and CAD.  She had an echo in 2010 which was essentially unremarkable and a Holter that demonstrated PVCs.    She had dyspnea when I saw her.  She had a negative POET (Plain Old Exercise Treadmill) 2018 . Echo in 2019 and there was mild MR.     Since I last saw her she has had no new complaints.  She had a lot of bladder issues and getting Botox injections.  She is seeing pulmonary and had a high-resolution CT yesterday.  I did review this in the no high risk findings per my review but she has not yet received these results from her pulmonologist.  She is not as active in the winter but she does plan on getting back into her garden when she had last spring.  She is not complaining of any new shortness of breath, PND or orthopnea.  She said no new palpitations, presyncope or syncope.  She has had no weight gain or edema.   Past Medical History:  Diagnosis Date   Blood transfusion without reported diagnosis    Chronic right ear pain    normal MRI   Esophageal stricture    Family history of breast cancer    Family history of colon cancer    Family history of melanoma    Family history of prostate cancer    Fibroids    uterine, history of   GERD (gastroesophageal reflux disease)    History of uterine prolapse    Hx of adenomatous polyp of colon 10/19/2014   Hyperlipidemia    Obesity    PVC (premature ventricular contraction)    first dx by holter in 1980's, echo (6/10) EF 0000000, normal diastolic fxn, normal size RV and fxn, mild MR, PASP 76mmHg    Tubal pregnancy    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 Silvano Rusk   ECTOPIC PREGNANCY SURGERY  1980   ESOPHAGOGASTRODUODENOSCOPY (EGD) WITH ESOPHAGEAL DILATION  04/03/12   Dr. Silvano Rusk   PARTIAL HYSTERECTOMY     abdominal, fibroids, and prolapse (1998) bladder tack   UPPER GASTROINTESTINAL ENDOSCOPY       Current Outpatient Medications  Medication Sig Dispense Refill   Ascorbic Acid (VITAMIN C) 1000 MG tablet Take 1,000 mg by mouth daily.     aspirin 81 MG tablet Take 81 mg by mouth daily.       calcium citrate-vitamin D (CITRACAL+D) 315-200 MG-UNIT per  tablet Take 1 tablet by mouth 2 (two) times daily.     Cholecalciferol (VITAMIN D-3) 5000 UNITS TABS Take 5,000 Units by mouth daily.     Coenzyme Q10 (CO Q-10) 200 MG CAPS Take 200 mg by mouth daily.     CRANBERRY PO Take 650 mg by mouth daily.     estradiol (ESTRACE) 0.1 MG/GM vaginal cream Place 0.5 g vaginally 2 (two) times a week. 42.5 g 11   Glucosamine-Chondroitin (GLUCOSAMINE CHONDR COMPLEX PO) Take 2 capsules by mouth daily.      GRAPE SEED EXTRACT PO Take 1 capsule by mouth daily.     hydrocortisone (ANUSOL-HC) 2.5 % rectal cream Place 1 Application rectally 2 (two) times daily. 30 g 1   hydrocortisone (CORTENEMA) 100 MG/60ML enema Use 2ml every night for 28 days 1680 mL 0   ketoconazole (NIZORAL) 2 % cream Apply 1 Application topically daily. To yeast rash 30 g 1   lansoprazole (PREVACID) 15 MG capsule Take 1  capsule (15 mg total) by mouth 2 (two) times daily before a meal. (Patient taking differently: Take 15 mg by mouth daily.)     LIALDA 1.2 g EC tablet Take 2 tablets by mouth twice daily 120 tablet 3   lidocaine-prilocaine (EMLA) cream Use on vulva/ urethra as needed for pain. 30 g 1   meclizine (ANTIVERT) 25 MG tablet Take 1 tablet (25 mg total) by mouth 3 (three) times daily as needed for dizziness or nausea (vertigo). 20 tablet 0   milk thistle 175 MG tablet Take 175 mg by mouth daily.     Omega-3 Fatty Acids (FISH OIL TRIPLE STRENGTH PO) Take 1 capsule by mouth daily.     Polyethyl Glycol-Propyl Glycol (SYSTANE OP) Place 1 drop into both eyes daily.     rosuvastatin (CRESTOR) 20 MG tablet Take 1 tablet (20 mg total) by mouth at bedtime. 90 tablet 3   vitamin B-12 (CYANOCOBALAMIN) 1000 MCG tablet Take 1,000 mcg by mouth daily.     zinc gluconate 50 MG tablet Take 50 mg by mouth daily.     No current facility-administered medications for this visit.    Allergies:   Pantoprazole sodium, Ranitidine, Omeprazole, and Sulfasalazine  ROS:  Please see the history of present illness.   Otherwise, review of systems are positive for none.   All other systems are reviewed and negative.    PHYSICAL EXAM: VS:  BP 118/80 (BP Location: Left Arm, Patient Position: Sitting, Cuff Size: Large)   Pulse 81   Ht 5\' 4"  (1.626 m)   Wt 196 lb (88.9 kg)   LMP 05/29/1996   BMI 33.64 kg/m  , BMI Body mass index is 33.64 kg/m.  GENERAL:  Well appearing NECK:  No jugular venous distention, waveform within normal limits, carotid upstroke brisk and symmetric, no bruits, no thyromegaly LUNGS:  Clear to auscultation bilaterally CHEST:  Unremarkable HEART:  PMI not displaced or sustained,S1 and S2 within normal limits, no S3, no S4, no clicks, no rubs, no murmurs ABD:  Flat, positive bowel sounds normal in frequency in pitch, no bruits, no rebound, no guarding, no midline pulsatile mass, no hepatomegaly, no  splenomegaly EXT:  2 plus pulses throughout, trace leg edema, no cyanosis no clubbing  EKG:  EKG is  acute ordered today. The ekg ordered today demonstrates normal sinus rhythm, rate 81, axis within normal limits, intervals within normal limits, no acute ST-T wave changes.     Recent Labs: 08/11/2021: ALT 16; BUN 17; Creatinine,  Ser 0.70; Hemoglobin 13.6; Platelets 190.0; Potassium 3.9; Sodium 138; TSH 3.12    Lipid Panel    Component Value Date/Time   CHOL 160 08/11/2021 0939   TRIG 135.0 08/11/2021 0939   HDL 58.60 08/11/2021 0939   CHOLHDL 3 08/11/2021 0939   VLDL 27.0 08/11/2021 0939   LDLCALC 74 08/11/2021 0939   LDLDIRECT 257.3 06/04/2008 1005     Wt Readings from Last 3 Encounters:  04/17/22 196 lb (88.9 kg)  01/03/22 191 lb 3.2 oz (86.7 kg)  12/27/21 193 lb (87.5 kg)      Other studies Reviewed: Additional studies/ records that were reviewed today include: Labs Review of the above records demonstrates:   See elsewhere   ASSESSMENT AND PLAN:  DYSLIPIDEMIA:    Her LDL was 74 with an HDL of 58.  No change in therapy.   HTN:   Her blood pressure is at target.  She will continue the current meds as listed.   MR:   There was only mild MR in March 2023.  I do not suspect this is changed clinically.  No change in therapy.  No further imaging.   PVCs:   She is not having any symptoms related to this.  No change in therapy.   Current medicines are reviewed at length with the patient today.  The patient does not have concerns regarding medicines.  The following changes have been made:  None  Labs/ tests ordered today include:   None  Orders Placed This Encounter  Procedures   EKG 12-Lead     Disposition:   FU with me in 12 months.    Signed, Minus Breeding, MD  04/17/2022 6:25 PM    [place]

## 2022-04-17 ENCOUNTER — Encounter: Payer: Self-pay | Admitting: Cardiology

## 2022-04-17 ENCOUNTER — Ambulatory Visit: Payer: Medicare Other | Attending: Cardiology | Admitting: Cardiology

## 2022-04-17 VITALS — BP 118/80 | HR 81 | Ht 64.0 in | Wt 196.0 lb

## 2022-04-17 DIAGNOSIS — I493 Ventricular premature depolarization: Secondary | ICD-10-CM | POA: Diagnosis not present

## 2022-04-17 DIAGNOSIS — E785 Hyperlipidemia, unspecified: Secondary | ICD-10-CM

## 2022-04-17 DIAGNOSIS — I10 Essential (primary) hypertension: Secondary | ICD-10-CM

## 2022-04-17 NOTE — Patient Instructions (Signed)
Medication Instructions:  Your physician recommends that you continue on your current medications as directed. Please refer to the Current Medication list given to you today.  *If you need a refill on your cardiac medications before your next appointment, please call your pharmacy*  Follow-Up: At [facility], you and your health needs are our priority.  As part of our continuing mission to provide you with exceptional heart care, we have created designated Provider Care Teams.  These Care Teams include your primary Cardiologist (physician) and Advanced Practice Providers (APPs -  Physician Assistants and Nurse Practitioners) who all work together to provide you with the care you need, when you need it.  We recommend signing up for the patient portal called "MyChart".  Sign up information is provided on this After Visit Summary.  MyChart is used to connect with patients for Virtual Visits (Telemedicine).  Patients are able to view lab/test results, encounter notes, upcoming appointments, etc.  Non-urgent messages can be sent to your provider as well.   To learn more about what you can do with MyChart, go to [email or web address].    Your next appointment:   12 month(s)  Provider:   Dr. Percival Spanish

## 2022-04-25 DIAGNOSIS — C44629 Squamous cell carcinoma of skin of left upper limb, including shoulder: Secondary | ICD-10-CM | POA: Diagnosis not present

## 2022-04-26 DIAGNOSIS — H43811 Vitreous degeneration, right eye: Secondary | ICD-10-CM | POA: Diagnosis not present

## 2022-04-26 DIAGNOSIS — H26493 Other secondary cataract, bilateral: Secondary | ICD-10-CM | POA: Diagnosis not present

## 2022-04-26 DIAGNOSIS — H35373 Puckering of macula, bilateral: Secondary | ICD-10-CM | POA: Diagnosis not present

## 2022-05-07 ENCOUNTER — Encounter: Payer: Self-pay | Admitting: Pulmonary Disease

## 2022-05-07 ENCOUNTER — Ambulatory Visit: Payer: Medicare Other | Admitting: Pulmonary Disease

## 2022-05-07 VITALS — BP 122/64 | HR 85 | Wt 198.2 lb

## 2022-05-07 DIAGNOSIS — J984 Other disorders of lung: Secondary | ICD-10-CM

## 2022-05-07 NOTE — Patient Instructions (Signed)
Nice to see you again  We will repeat a CT scan in about 2 years, overall things are unchanged and this is reassuring  I will send a message to Dr. Leone Payor about the findings of the liver  Return to clinic in 2 years or sooner as needed with Dr. Judeth Horn

## 2022-05-07 NOTE — Progress Notes (Signed)
@Patient  ID: Rachel Carlson, female    DOB: [birth year], 77 y.o.   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 Pt had Ct scan on 04/13/2022.    Referring provider: Judy Pimple, MD  HPI:   77 y.o. woman whom are seen in follow up for evaluation of abnormal CT findings.  Most recent cardiology note reviewed.  Most recent GI note reviewed.  Return for follow-up after serial or repeat imaging of the chest.  CT high-res 03/2022.  On my review interpretation shows stable right middle lobe faint peripheral groundglass likely suggestive of chronic scarring due to inflammatory or postinfectious/inflammatory cause, linear atelectasis in the lingula.  Stable.  Reviewed 2023 CT scan on my interpretation similar findings.  Reviewed CT scan of the abdomen in 2016 on my review interpretation similar findings.  Discussed how stable things are.  Likely related to prior insult of infection or pulmonary contusion after fall etc.  Not worried about progression.  Discovered other issues or findings on CT scan as discussed below.  HPI initial visit: Patient is doing well.  Denies any dyspnea.  No concerning respiratory symptoms.  She had CT coronary scoring scan recently.  Largely due to significant family history of MI in father in 75s and mother in her 32s.  This demonstrated calcified granuloma right upper lobe and scattered calcified lymph nodes in the chest with notably locally right hilar node near granuloma as well as faint groundglass scattered opacities peripherally right middle lobe on my review and interpretation.  She has chronic cough.  Dry.  No significant seasonal or environmental allergy symptoms.  Cough worse when breathing deeply in.  Brief cough with deep breath.  Severity and frequency of cough varies day-to-day.  She has hiatal hernia on CT scan that was reviewed.  She has reflux symptoms.  Usually well controlled with  PPI.  Occasionally takes twice daily when eating acidic foods.  PMH: Hyperlipidemia Surgical history: Hysterectomy Family history: Mother with CAD, father with CAD, passed in his 35s from presumed MI Social history: Never smoker, lives in [place], retired International aid/development worker, husband has Administrator / Pulmonary Flowsheets:   ACT:      No data to display           MMRC:     No data to display           Epworth:      No data to display           Tests:   FENO:  No results found for: "NITRICOXIDE"  PFT:     No data to display           WALK:      No data to display           Imaging: Personally reviewed and as per EMR discussion this note CT Chest High Resolution  Result Date: 04/16/2022 CLINICAL DATA:  Follow-up lung fibrosis EXAM: CT CHEST WITHOUT CONTRAST TECHNIQUE: Multidetector CT imaging of the chest was performed following the standard protocol without intravenous contrast. High resolution imaging of the lungs, as well as inspiratory and expiratory imaging, was performed. RADIATION DOSE REDUCTION: This exam was performed according to the departmental dose-optimization program which includes automated exposure control, adjustment of the mA and/or kV according to patient size and/or use of iterative reconstruction technique. COMPARISON:  Cardiac CT dated April 27, 2021 FINDINGS: Cardiovascular:  Normal heart size. Trace pericardial effusion. Normal caliber thoracic aorta with mild atherosclerotic disease. Severe coronary artery calcifications. Mediastinum/Nodes: Moderate hiatal hernia. Thyroid is unremarkable. Calcified right hilar lymph nodes, likely sequela prior granulomatous infection. No enlarged lymph nodes seen in the chest. Lungs/Pleura: Central airways are patent. Mild air trapping. Mild scattered basilar linear opacities which are likely due to scarring or atelectasis. No reticulations, traction bronchiectasis, architectural distortion or  honeycomb change. No consolidation, pleural effusion or pneumothorax. Right lower lobe benign calcified granuloma. Upper Abdomen: Mildly nodular liver contour. Trace abdominal ascites. Musculoskeletal: No chest wall mass or suspicious bone lesions identified. IMPRESSION: 1. No evidence of fibrotic interstitial lung disease. 2. Mild air trapping, findings can be seen in the setting of small airways disease. 3. Nodular liver contour and trace abdominal ascites, findings are suggestive of cirrhosis. 4. Coronary artery calcifications and aortic Atherosclerosis (ICD10-I70.0). Electronically Signed   By: Allegra LaiLeah  Strickland M.D.   On: 04/16/2022 18:09    Lab Results: Personally reviewed CBC    Component Value Date/Time   WBC 4.2 08/11/2021 0939   RBC 4.51 08/11/2021 0939   HGB 13.6 08/11/2021 0939   HCT 40.2 08/11/2021 0939   PLT 190.0 08/11/2021 0939   MCV 89.1 08/11/2021 0939   MCHC 33.8 08/11/2021 0939   RDW 13.8 08/11/2021 0939   LYMPHSABS 1.1 08/11/2021 0939   MONOABS 0.5 08/11/2021 0939   EOSABS 0.1 08/11/2021 0939   BASOSABS 0.0 08/11/2021 0939    BMET    Component Value Date/Time   NA 138 08/11/2021 0939   K 3.9 08/11/2021 0939   CL 106 08/11/2021 0939   CO2 25 08/11/2021 0939   GLUCOSE 105 (H) 08/11/2021 0939   BUN 17 08/11/2021 0939   CREATININE 0.70 08/11/2021 0939   CALCIUM 9.7 08/11/2021 0939   GFRNONAA 94.96 06/06/2009 1016   GFRAA 94 06/04/2007 1522    BNP No results found for: "BNP"  ProBNP No results found for: "PROBNP"  Specialty Problems   None   Allergies  Allergen Reactions   Pantoprazole Sodium Diarrhea    Abdominal pain   Ranitidine Diarrhea    abdominal pain   Omeprazole Diarrhea    Abdominal pain   Sulfasalazine Rash    Fever, chills, headache, muscle pain    Immunization History  Administered Date(s) Administered   Influenza Split 12/04/2010, 10/31/2015   Influenza Whole 12/29/2008, 11/22/2009   Influenza, High Dose Seasonal PF  11/20/2018, 01/04/2022   Influenza,inj,Quad PF,6+ Mos 12/06/2017   Influenza-Unspecified 11/29/2012, 01/04/2016, 10/30/2016   Moderna Sars-Covid-2 Vaccination 04/02/2019, 05/05/2019   Pneumococcal Conjugate-13 07/20/2015   Pneumococcal Polysaccharide-23 08/17/2021   Pneumococcal-Unspecified 01/30/2013   Respiratory Syncytial Virus Vaccine,Recomb Aduvanted(Arexvy) 01/04/2022   Td 12/29/2005   Tdap 10/31/2015   Zoster Recombinat (Shingrix) 01/11/2017, 04/11/2017   Zoster, Live 06/10/2009    Past Medical History:  Diagnosis Date   Blood transfusion without reported diagnosis    Chronic right ear pain    normal MRI   Esophageal stricture    Family history of breast cancer    Family history of colon cancer    Family history of melanoma    Family history of prostate cancer    Fibroids    uterine, history of   GERD (gastroesophageal reflux disease)    History of uterine prolapse    Hx of adenomatous polyp of colon 10/19/2014   Hyperlipidemia    Obesity    PVC (premature ventricular contraction)    first dx by holter in 1980's,  echo (6/10) EF 55-60%, normal diastolic fxn, normal size RV and fxn, mild MR, PASP   Tubal pregnancy    Ulcerative proctosigmoiditis    Urge urinary incontinence 06/03/2007   Qualifier: Diagnosis of  By: Lindwood Qua CMA, Jerl Santos      Tobacco History: Social History   Tobacco Use  Smoking Status Never  Smokeless Tobacco Never   Counseling given: Not Answered   Continue to not smoke  Outpatient Encounter Medications as of 05/07/2022  Medication Sig   Ascorbic Acid (VITAMIN C) 1000 MG tablet Take 1,000 mg by mouth daily.   aspirin 81 MG tablet Take 81 mg by mouth daily.     calcium citrate-vitamin D (CITRACAL+D) 315-200 MG-UNIT per tablet Take 1 tablet by mouth 2 (two) times daily.   Cholecalciferol (VITAMIN D-3) 5000 UNITS TABS Take 5,000 Units by mouth daily.   Coenzyme Q10 (CO Q-10) 200 MG CAPS Take 200 mg by mouth daily.   CRANBERRY PO  Take 650 mg by mouth daily.   estradiol (ESTRACE) 0.1 MG/GM vaginal cream Place 0.5 g vaginally 2 (two) times a week.   Glucosamine-Chondroitin (GLUCOSAMINE CHONDR COMPLEX PO) Take 2 capsules by mouth daily.    GRAPE SEED EXTRACT PO Take 1 capsule by mouth daily.   hydrocortisone (ANUSOL-HC) 2.5 % rectal cream Place 1 Application rectally 2 (two) times daily.   hydrocortisone (CORTENEMA) 100 MG/60ML enema Use 45ml every night for 28 days   ketoconazole (NIZORAL) 2 % cream Apply 1 Application topically daily. To yeast rash   lansoprazole (PREVACID) 15 MG capsule Take 1 capsule (15 mg total) by mouth 2 (two) times daily before a meal. (Patient taking differently: Take 15 mg by mouth daily.)   LIALDA 1.2 g EC tablet Take 2 tablets by mouth twice daily   lidocaine-prilocaine (EMLA) cream Use on vulva/ urethra as needed for pain.   meclizine (ANTIVERT) 25 MG tablet Take 1 tablet (25 mg total) by mouth 3 (three) times daily as needed for dizziness or nausea (vertigo).   milk thistle 175 MG tablet Take 175 mg by mouth daily.   Omega-3 Fatty Acids (FISH OIL TRIPLE STRENGTH PO) Take 1 capsule by mouth daily.   Polyethyl Glycol-Propyl Glycol (SYSTANE OP) Place 1 drop into both eyes daily.   rosuvastatin (CRESTOR) 20 MG tablet Take 1 tablet (20 mg total) by mouth at bedtime.   vitamin B-12 (CYANOCOBALAMIN) 1000 MCG tablet Take 1,000 mcg by mouth daily.   zinc gluconate 50 MG tablet Take 50 mg by mouth daily.   No facility-administered encounter medications on file as of 05/07/2022.     Review of Systems  Review of Systems  N/a Physical Exam  BP 122/64 (BP Location: Left Arm, Patient Position: Sitting, Cuff Size: Normal)   Pulse 85   Wt 198 lb 3.2 oz (89.9 kg)   LMP 05/29/1996   SpO2 95%   BMI 34.02 kg/m   Wt Readings from Last 5 Encounters:  05/07/22 198 lb 3.2 oz (89.9 kg)  04/17/22 196 lb (88.9 kg)  01/03/22 191 lb 3.2 oz (86.7 kg)  12/27/21 193 lb (87.5 kg)  10/19/21 189 lb (85.7 kg)     BMI Readings from Last 5 Encounters:  05/07/22 34.02 kg/m  04/17/22 33.64 kg/m  01/03/22 36.13 kg/m  12/27/21 36.47 kg/m  10/19/21 35.71 kg/m     Physical Exam General: Well-appearing, no acute distress Eyes: EOMI, icterus Neck: Supple, no JVP Pulmonary: Clear, no crackles, normal work of breathing Cardiovascular: Regular rate and  rhythm, no murmur Abdomen: Nondistended, bowel sounds present MSK: No synovitis, joint effusion Neuro: Normal gait, no weakness Psych: Normal mood, full affect   Assessment & Plan:   Calcified granuloma: Right lower lobe, with associated calcified lymph nodes.  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  No further follow-up needed.  Right middle lung fibrosis: Groundglass hazy opacities periphery right middle lobe, linear opacity favored to be atelectasis more inferiorly towards the fissure.  Suspect related to old insult.  Possible rib fracture versus infection.  Repeat CT scan 03/2022 stable.  Will follow-up again 2026 at her request, unlikely to progress.  Table findings in 2023 and 2016 on my review of serial images.  Chronic cough: Dry.  Present for years.  Associated hiatal hernia and occasional reflux symptoms.  Most likely related to GERD, silent GERD in setting of hiatal hernia with acid content well treated with PPI.  Has improved in the interim.  Possible cirrhosis: Nodular liver and trace ascites on CT 03/2022.  Message sent to gastroenterologist for further evaluation, follow-up etc.   Return in about 2 years (around 05/06/2024) for f/u Dr. Judeth Horn.   Karren Burly, MD 05/07/2022

## 2022-05-10 ENCOUNTER — Other Ambulatory Visit: Payer: Self-pay | Admitting: Interventional Radiology

## 2022-05-10 ENCOUNTER — Ambulatory Visit
Admission: RE | Admit: 2022-05-10 | Discharge: 2022-05-10 | Disposition: A | Discharge status: Home / Self Care | Payer: Medicare Other | Source: Ambulatory Visit | Attending: Interventional Radiology | Admitting: Interventional Radiology

## 2022-05-10 DIAGNOSIS — I863 Vulval varices: Secondary | ICD-10-CM

## 2022-05-10 DIAGNOSIS — I868 Varicose veins of other specified sites: Secondary | ICD-10-CM | POA: Diagnosis not present

## 2022-05-10 NOTE — Progress Notes (Signed)
Chief Complaint: Patient was seen in consultation today for sclerotherapy of symptomatic and bleeding right labial varicosities.  History of Present Illness: Rachel Carlson is a 77 y.o. female with a history of prior bleeding episodes from a protruding superficial right labial varicosity.  Ultrasound performed at the time of consultation on 03/21/2022 demonstrated a focal superficial varicosity along the inferior aspect of the right labia that communicates with small superficial veins and a large network of superficial veins in the superior labial and vulvar region.  She presents today for ultrasound-guided sclerotherapy.  Since consultation, she has not had any additional bleeding episodes.  Past Medical History:  Diagnosis Date   Blood transfusion without reported diagnosis    Chronic right ear pain    normal MRI   Esophageal stricture    Family history of breast cancer    Family history of colon cancer    Family history of melanoma    Family history of prostate cancer    Fibroids    uterine, history of   GERD (gastroesophageal reflux disease)    History of uterine prolapse    Hx of adenomatous polyp of colon 10/19/2014   Hyperlipidemia    Obesity    PVC (premature ventricular contraction)    first dx by holter in 1980's, echo (6/10) EF 55-60%, normal diastolic fxn, normal size RV and fxn, mild MR, PASP   Tubal pregnancy    Ulcerative proctosigmoiditis    Urge urinary incontinence 06/03/2007   Qualifier: Diagnosis of  By: Lindwood Qua CMA, Jerl Santos      Past Surgical History:  Procedure Laterality Date   BLADDER NECK SUSPENSION  1998   BREAST BIOPSY Left    CATARACT EXTRACTION Right 01/2020   CATARACT EXTRACTION W/PHACO Left 08/12/2020   Procedure: CATARACT EXTRACTION PHACO AND INTRAOCULAR LENS PLACEMENT LEFT EYE;  Surgeon: Fabio Pierce, MD;  Location: [facility];  Service: Ophthalmology;  Laterality: Left;  left CDE=10.71   COLONOSCOPY  05/18/2011/10/12/14    Dr. Stan Head   ECTOPIC PREGNANCY SURGERY  1980   ESOPHAGOGASTRODUODENOSCOPY (EGD) WITH ESOPHAGEAL DILATION  04/03/12   Dr. Stan Head   PARTIAL HYSTERECTOMY     abdominal, fibroids, and prolapse (1998) bladder tack   UPPER GASTROINTESTINAL ENDOSCOPY      Allergies: Pantoprazole sodium, Ranitidine, Omeprazole, and Sulfasalazine  Medications: Prior to Admission medications   Medication Sig Start Date End Date Taking? Authorizing Provider  Ascorbic Acid (VITAMIN C) 1000 MG tablet Take 1,000 mg by mouth daily.    [provider]  aspirin 81 MG tablet Take 81 mg by mouth daily.      [provider]  calcium citrate-vitamin D (CITRACAL+D) 315-200 MG-UNIT per tablet Take 1 tablet by mouth 2 (two) times daily.    [provider]  Cholecalciferol (VITAMIN D-3) 5000 UNITS TABS Take 5,000 Units by mouth daily.    [provider]  Coenzyme Q10 (CO Q-10) 200 MG CAPS Take 200 mg by mouth daily.    [provider]  CRANBERRY PO Take 650 mg by mouth daily.    [provider]  estradiol (ESTRACE) 0.1 MG/GM vaginal cream Place 0.5 g vaginally 2 (two) times a week. 01/18/22   Marguerita Beards, MD  Glucosamine-Chondroitin (GLUCOSAMINE CHONDR COMPLEX PO) Take 2 capsules by mouth daily.     [provider]  GRAPE SEED EXTRACT PO Take 1 capsule by mouth daily.    [provider]  hydrocortisone (ANUSOL-HC) 2.5 % rectal cream Place  1 Application rectally 2 (two) times daily. 10/13/21   Iva Boop, MD  hydrocortisone Laurance Flatten) 100 MG/60ML enema Use 60ml every night for 28 days 10/20/21   Unk Lightning, PA  ketoconazole (NIZORAL) 2 % cream Apply 1 Application topically daily. To yeast rash 08/17/21   Tower, Audrie Gallus, MD  lansoprazole (PREVACID) 15 MG capsule Take 1 capsule (15 mg total) by mouth 2 (two) times daily before a meal. Patient taking differently: Take 15 mg by mouth daily. 09/02/17   Iva Boop, MD  LIALDA  1.2 g EC tablet Take 2 tablets by mouth twice daily 10/18/21   Iva Boop, MD  lidocaine-prilocaine (EMLA) cream Use on vulva/ urethra as needed for pain. 04/06/22   Marguerita Beards, MD  meclizine (ANTIVERT) 25 MG tablet Take 1 tablet (25 mg total) by mouth 3 (three) times daily as needed for dizziness or nausea (vertigo). 01/03/22   Tower, Audrie Gallus, MD  milk thistle 175 MG tablet Take 175 mg by mouth daily.    [provider]  Omega-3 Fatty Acids (FISH OIL TRIPLE STRENGTH PO) Take 1 capsule by mouth daily.    [provider]  Polyethyl Glycol-Propyl Glycol (SYSTANE OP) Place 1 drop into both eyes daily.    [provider]  rosuvastatin (CRESTOR) 20 MG tablet Take 1 tablet (20 mg total) by mouth at bedtime. 08/17/21   Tower, Audrie Gallus, MD  vitamin B-12 (CYANOCOBALAMIN) 1000 MCG tablet Take 1,000 mcg by mouth daily.    [provider]  zinc gluconate 50 MG tablet Take 50 mg by mouth daily.    [provider]     Family History  Problem Relation Age of Onset   Sudden death Father 24       "coronary arteriosclerosis" on death certificate   Heart attack Mother 66   Transient ischemic attack Mother    Diabetes type II Mother    Sudden death Mother        died age 67   Diabetes Mother    Coronary artery disease Mother    Skin cancer Mother    Barrett's esophagus Mother    Arrhythmia Brother    Colon cancer Brother 32   Breast cancer Sister        dx in her 85s   Colon polyps Sister        adenomatous   Colon cancer Son 8       lynch syndrome   Melanoma Maternal Uncle    Congestive Heart Failure Maternal Grandmother    Prostate cancer Maternal Grandfather        dx in 57s   Tuberculosis Paternal Grandfather    Skin cancer Maternal Aunt    Barrett's esophagus Maternal Aunt    Other Son        parotid gland tumor   Ehlers-Danlos syndrome Daughter    Colon cancer Cousin        mid 71s; maternal cousin   Colon cancer Other        MGMs  brother   Esophageal cancer Neg Hx    Stomach cancer Neg Hx    Rectal cancer Neg Hx     Social History   Socioeconomic History   Marital status: Married    Spouse name: Not on file   Number of children: 3   Years of education: Not on file   Highest education level: Not on file  Occupational History   Occupation: International aid/development worker  Comment:      Employer: unemployed  Tobacco Use   Smoking status: Never   Smokeless tobacco: Never  Vaping Use   Vaping Use: Never used  Substance and Sexual Activity   Alcohol use: No    Alcohol/week: 0.0 standard drinks of alcohol    Comment: rarely beer   Drug use: No   Sexual activity: Not Currently  Other Topics Concern   Not on file  Social History Narrative   Retired International aid/development worker, 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)   Grown children   Never smoker no alcohol tobacco or drug use   Social Determinants of Health   Financial Resource Strain: Low Risk  (08/25/2021)   Overall Financial Resource Strain (CARDIA)    Difficulty of Paying Living Expenses: Not hard at all  Food Insecurity: No Food Insecurity (08/25/2021)   Hunger Vital Sign    Worried About Running Out of Food in the Last Year: Never true    Ran Out of Food in the Last Year: Never true  Transportation Needs: No Transportation Needs (08/25/2021)   PRAPARE - Administrator, Civil Service (Medical): No    Lack of Transportation (Non-Medical): No  Physical Activity: Inactive (08/25/2021)   Exercise Vital Sign    Days of Exercise per Week: 0 days    Minutes of Exercise per Session: 0 min  Stress: No Stress Concern Present (08/25/2021)   [employer] of Occupational Health - Occupational Stress Questionnaire    Feeling of Stress : Not at all  Social Connections: Socially Integrated (08/25/2021)   Social Connection and Isolation Panel [NHANES]    Frequency of Communication with Friends and Family: More than three times a week    Frequency of Social Gatherings with  Friends and Family: More than three times a week    Attends Religious Services: More than 4 times per year    Active Member of [employer] or Organizations: Yes    Attends Engineer, structural: More than 4 times per year    Marital Status: Married    Review of Systems: A 12 point ROS discussed and pertinent positives are indicated in the HPI above.  All other systems are negative.  Review of Systems  Constitutional: Negative.   Respiratory: Negative.    Cardiovascular: Negative.   Gastrointestinal: Negative.   Genitourinary:        Pain associated with a right labial varicosity.    Vital Signs: LMP 05/29/1996    Physical Exam Constitutional:      General: She is not in acute distress. Genitourinary:    Comments: Stable appearance of reddish colored, rounded protruding vein from skin of inferior right labia. Musculoskeletal:        General: No swelling.  Neurological:     Mental Status: She is alert.      Imaging: [place] Injec Sclerotherapy Mult  Result Date: 05/10/2022 INDICATION: Symptomatic bleeding varicosity of the right labia. EXAM: ULTRASOUND GUIDED INJECTION OF SCLEROSANT MEDICATIONS: None. ANESTHESIA/SEDATION: None. COMPLICATIONS: None immediate. PROCEDURE: Informed written consent was obtained from the patient after a thorough discussion of the procedural risks, benefits and alternatives. All questions were addressed. Maximal Sterile Barrier Technique was utilized including caps, mask, sterile gowns, sterile gloves, sterile drape, hand hygiene and skin antiseptic. A timeout was performed prior to the initiation of the procedure. Ultrasound was performed of the right labial region. The region was then prepped with chlorhexidine and draped in standard fashion. A volume of 2 mL of  1% polidocanol was foamed with an equal volume of air. This sclerosant was utilized in performing ultrasound-guided sclerotherapy with a 25 gauge needle of a prominent subcutaneous vein in the  right labial region as well as a protruding varicosity of the right labia. FINDINGS: Initial ultrasound demonstrates a network of subcutaneous veins in the region of the right labia. One of these veins was chosen for sclerotherapy and directly injected with sclerosant under ultrasound guidance. Ultrasound demonstrates spread of sclerosant throughout communicating veins. Injection of sclerosant was then performed of a protruding varicosity of the right labia which has been the source of prior bleeding episodes. IMPRESSION: Ultrasound-guided sclerotherapy performed of right labial varicosities. Electronically Signed   By: Irish LackGlenn  Kimble Delaurentis M.D.   On: 05/10/2022 12:32   US RAD EVAL AND MGMT  Result Date: 05/10/2022 Please refer to "Notes" to see consult details.  CT Chest High Resolution  Result Date: 04/16/2022 CLINICAL DATA:  Follow-up lung fibrosis EXAM: CT CHEST WITHOUT CONTRAST TECHNIQUE: Multidetector CT imaging of the chest was performed following the standard protocol without intravenous contrast. High resolution imaging of the lungs, as well as inspiratory and expiratory imaging, was performed. RADIATION DOSE REDUCTION: This exam was performed according to the departmental dose-optimization program which includes automated exposure control, adjustment of the mA and/or kV according to patient size and/or use of iterative reconstruction technique. COMPARISON:  Cardiac CT dated April 27, 2021 FINDINGS: Cardiovascular: Normal heart size. Trace pericardial effusion. Normal caliber thoracic aorta with mild atherosclerotic disease. Severe coronary artery calcifications. Mediastinum/Nodes: Moderate hiatal hernia. Thyroid is unremarkable. Calcified right hilar lymph nodes, likely sequela prior granulomatous infection. No enlarged lymph nodes seen in the chest. Lungs/Pleura: Central airways are patent. Mild air trapping. Mild scattered basilar linear opacities which are likely due to scarring or atelectasis. No  reticulations, traction bronchiectasis, architectural distortion or honeycomb change. No consolidation, pleural effusion or pneumothorax. Right lower lobe benign calcified granuloma. Upper Abdomen: Mildly nodular liver contour. Trace abdominal ascites. Musculoskeletal: No chest wall mass or suspicious bone lesions identified. IMPRESSION: 1. No evidence of fibrotic interstitial lung disease. 2. Mild air trapping, findings can be seen in the setting of small airways disease. 3. Nodular liver contour and trace abdominal ascites, findings are suggestive of cirrhosis. 4. Coronary artery calcifications and aortic Atherosclerosis (ICD10-I70.0). Electronically Signed   By: Allegra LaiLeah  Strickland M.D.   On: 04/16/2022 18:09    Labs:  CBC: Recent Labs    08/11/21 0939  WBC 4.2  HGB 13.6  HCT 40.2  PLT 190.0    COAGS: No results for input(s): "INR", "APTT" in the last 8760 hours.  BMP: Recent Labs    08/11/21 0939  NA 138  K 3.9  CL 106  CO2 25  GLUCOSE 105*  BUN 17  CALCIUM 9.7  CREATININE 0.70    LIVER FUNCTION TESTS: Recent Labs    08/11/21 0939  BILITOT 0.7  AST 17  ALT 16  ALKPHOS 55  PROT 7.2  ALBUMIN 4.6     Assessment and Plan:  Ultrasound-guided sclerotherapy was performed today of the focally protruding varicosity of the right labia as well as communicating subcutaneous veins in the right labial region.  A separate procedure report is dictated.  The patient tolerated the procedure well.  Since she has a long distance to travel for follow-up, I told her that I would call her for a telehealth follow-up to gauge initial success of a single sclerotherapy treatment in 4 weeks and determine need for any additional sclerotherapy  at that time.   Electronically Signed: Reola Calkins 05/10/2022, 1:12 PM    I spent a total of 10 Minutes in face to face in clinical consultation, greater than 50% of which was counseling/coordinating care for symptomatic right labial  varicosities.

## 2022-05-15 ENCOUNTER — Ambulatory Visit: Payer: Medicare Other | Admitting: Internal Medicine

## 2022-05-15 ENCOUNTER — Other Ambulatory Visit (INDEPENDENT_AMBULATORY_CARE_PROVIDER_SITE_OTHER): Payer: Medicare Other

## 2022-05-15 ENCOUNTER — Encounter: Payer: Self-pay | Admitting: Internal Medicine

## 2022-05-15 VITALS — BP 130/80 | HR 71 | Ht 62.0 in | Wt 194.8 lb

## 2022-05-15 DIAGNOSIS — R932 Abnormal findings on diagnostic imaging of liver and biliary tract: Secondary | ICD-10-CM | POA: Diagnosis not present

## 2022-05-15 DIAGNOSIS — K5130 Ulcerative (chronic) rectosigmoiditis without complications: Secondary | ICD-10-CM

## 2022-05-15 LAB — CBC WITH DIFFERENTIAL/PLATELET
Basophils Absolute: 0 10*3/uL (ref 0.0–0.1)
Basophils Relative: 0.7 % (ref 0.0–3.0)
Eosinophils Absolute: 0 10*3/uL (ref 0.0–0.7)
Eosinophils Relative: 0.7 % (ref 0.0–5.0)
HCT: 39.9 % (ref 36.0–46.0)
Hemoglobin: 13.3 g/dL (ref 12.0–15.0)
Lymphocytes Relative: 16.6 % (ref 12.0–46.0)
Lymphs Abs: 1 10*3/uL (ref 0.7–4.0)
MCHC: 33.3 g/dL (ref 30.0–36.0)
MCV: 88.3 fl (ref 78.0–100.0)
Monocytes Absolute: 0.5 10*3/uL (ref 0.1–1.0)
Monocytes Relative: 7.9 % (ref 3.0–12.0)
Neutro Abs: 4.5 10*3/uL (ref 1.4–7.7)
Neutrophils Relative %: 74.1 % (ref 43.0–77.0)
Platelets: 246 10*3/uL (ref 150.0–400.0)
RBC: 4.51 Mil/uL (ref 3.87–5.11)
RDW: 13.5 % (ref 11.5–15.5)
WBC: 6.1 10*3/uL (ref 4.0–10.5)

## 2022-05-15 LAB — COMPREHENSIVE METABOLIC PANEL
ALT: 10 U/L (ref 0–35)
AST: 14 U/L (ref 0–37)
Albumin: 4.4 g/dL (ref 3.5–5.2)
Alkaline Phosphatase: 63 U/L (ref 39–117)
BUN: 10 mg/dL (ref 6–23)
CO2: 26 mEq/L (ref 19–32)
Calcium: 9.5 mg/dL (ref 8.4–10.5)
Chloride: 106 mEq/L (ref 96–112)
Creatinine, Ser: 0.63 mg/dL (ref 0.40–1.20)
GFR: 87.4 mL/min (ref >60.00)
Glucose, Bld: 105 mg/dL — ABNORMAL HIGH (ref 70–99)
Potassium: 4.3 mEq/L (ref 3.5–5.1)
Sodium: 139 mEq/L (ref 135–145)
Total Bilirubin: 0.6 mg/dL (ref 0.2–1.2)
Total Protein: 7.1 g/dL (ref 6.0–8.3)

## 2022-05-15 LAB — PROTIME-INR
INR: 1.1 ratio — ABNORMAL HIGH (ref 0.8–1.0)
Prothrombin Time: 11.5 s (ref 9.6–13.1)

## 2022-05-15 NOTE — Patient Instructions (Signed)
_______________________________________________________  If your blood pressure at your visit was 140/90 or greater, please contact your primary care physician to follow up on this.  _______________________________________________________  If you are age 75 or older, your body mass index should be between 23-30. Your Body mass index is 35.63 kg/m. If this is out of the aforementioned range listed, please consider follow up with your Primary Care Provider.  If you are age 23 or younger, your body mass index should be between 19-25. Your Body mass index is 35.63 kg/m. If this is out of the aformentioned range listed, please consider follow up with your Primary Care Provider.   ________________________________________________________  The [facility] providers would like to encourage you to use [facility] to communicate with providers for non-urgent requests or questions.  Due to long hold times on the telephone, sending your provider a message by [facility] may be a faster and more efficient way to get a response.  Please allow 48 business hours for a response.  Please remember that this is for non-urgent requests.  _______________________________________________________  Rachel Carlson have been scheduled for an abdominal ultrasound at [facility] Radiology (1st floor of hospital) on 05-23-2022 at 10am. Please arrive 30 minutes prior to your appointment for registration. Make certain not to have anything to eat or drink 6 hours prior to your appointment. Should you need to reschedule your appointment, please contact radiology at [phone]. This test typically takes about 30 minutes to perform.  Your provider has requested that you go to the basement level for lab work before leaving today. Press "B" on the elevator. The lab is located at the first door on the left as you exit the elevator.  Due to recent changes in healthcare laws, you may see the results of your imaging and laboratory studies on MyChart before  your provider has had a chance to review them.  We understand that in some cases there may be results that are confusing or concerning to you. Not all laboratory results come back in the same time frame and the provider may be waiting for multiple results in order to interpret others.  Please give [place] 48 hours in order for your provider to thoroughly review all the results before contacting the office for clarification of your results.   It was a pleasure to see you today!  Thank you for trusting me with your gastrointestinal care!

## 2022-05-15 NOTE — Progress Notes (Unsigned)
Rachel Carlson 76 y.o. Sep 03, 1945 [id number]  Assessment & Plan:   Encounter Diagnoses  Name Primary?   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 no thrombocytopenia no abnormal LFTs no physical findings.  It certainly possible and I suspect metabolic associated fatty liver disease would be the culprit.  She is quite anxious about it today I did my best to reassure her.  She is worried about her ability to care for her husband who has upper motor neuron predominant ALS.  Workup as below further plans pending this. Orders Placed This Encounter  Procedures   US ABDOMEN COMPLETE W/ELASTOGRAPHY   CBC with Differential/Platelet   Comprehensive metabolic panel   Protime-INR    Her ulcerative colitis is under good control at this time.  CC: Tower, Audrie Gallus, MD Dr. [name] Subjective:   Chief Complaint: Abnormal CT scan raising question of cirrhosis  HPI 77 year old white woman that I follow with chronic ulcerative proctosigmoiditis, who was seen in pulmonary clinic by Dr. Judeth Horn and had CT of the chest because of a history of pulmonary fibrosis.  There was a nodular contour to the liver described by the radiologist on exam of 04/16/2022.  There was trace abdominal ascites.  She was sent back to me to evaluate.  She describes some mild abdominal fullness at times.  She is quite concerned about the cirrhosis and its impact on her ability to care for her husband who has ALS.  She is not a drinker.  No prior history of liver disease known.  She is not experiencing significant pedal edema.   Allergies  Allergen Reactions   Pantoprazole Sodium Diarrhea    Abdominal pain   Ranitidine Diarrhea    abdominal pain   Omeprazole Diarrhea    Abdominal pain   Sulfasalazine Rash    Fever, chills, headache, muscle pain    Current Meds  Medication Sig   Ascorbic Acid (VITAMIN C) 1000 MG tablet Take 1,000 mg by mouth daily.   aspirin 81 MG tablet Take 81 mg by mouth daily.     calcium citrate-vitamin D (CITRACAL+D) 315-200 MG-UNIT per tablet Take 1 tablet by mouth 2 (two) times daily.   Cholecalciferol (VITAMIN D-3) 5000 UNITS TABS Take 5,000 Units by mouth daily.   Coenzyme Q10 (CO Q-10) 200 MG CAPS Take 200 mg by mouth daily.   CRANBERRY PO Take 650 mg by mouth daily.   estradiol (ESTRACE) 0.1 MG/GM vaginal cream Place 0.5 g vaginally 2 (two) times a week.   Glucosamine-Chondroitin (GLUCOSAMINE CHONDR COMPLEX PO) Take 2 capsules by mouth daily.    GRAPE SEED EXTRACT PO Take 1 capsule by mouth daily.   hydrocortisone (ANUSOL-HC) 2.5 % rectal cream Place 1 Application rectally 2 (two) times daily.   hydrocortisone (CORTENEMA) 100 MG/60ML enema Use 60ml every night for 28 days   ketoconazole (NIZORAL) 2 % cream Apply 1 Application topically daily. To yeast rash   lansoprazole (PREVACID) 15 MG capsule Take 1 capsule (15 mg total) by mouth 2 (two) times daily before a meal. (Patient taking differently: Take 15 mg by mouth daily. Uses over the counter brand)   LIALDA 1.2 g EC tablet Take 2 tablets by mouth twice daily   lidocaine-prilocaine (EMLA) cream Use on vulva/ urethra as needed for pain.   meclizine (ANTIVERT) 25 MG tablet  Take 1 tablet (25 mg total) by mouth 3 (three) times daily as needed for dizziness or nausea (vertigo).   milk thistle 175 MG tablet Take 175 mg by mouth daily.   Omega-3 Fatty Acids (FISH OIL TRIPLE STRENGTH PO) Take 1 capsule by mouth daily.   Polyethyl Glycol-Propyl Glycol (SYSTANE OP) Place 1 drop into both eyes daily.   rosuvastatin (CRESTOR) 20 MG tablet Take 1 tablet (20 mg total) by mouth at bedtime.   vitamin B-12 (CYANOCOBALAMIN) 1000 MCG tablet Take 1,000 mcg by mouth daily.   zinc gluconate 50 MG tablet Take 50 mg by mouth daily.   Past Medical History:  Diagnosis Date    Blood transfusion without reported diagnosis    Chronic right ear pain    normal MRI   Esophageal stricture    Family history of breast cancer    Family history of colon cancer    Family history of melanoma    Family history of prostate cancer    Fibroids    uterine, history of   GERD (gastroesophageal reflux disease)    History of uterine prolapse    Hx of adenomatous polyp of colon 10/19/2014   Hyperlipidemia    Obesity    PVC (premature ventricular contraction)    first dx by holter in 1980's, echo (6/10) EF 55-60%, normal diastolic fxn, normal size RV and fxn, mild MR, PASP   Squamous cell carcinoma of arm    Tubal pregnancy    Ulcerative proctosigmoiditis    Urge urinary incontinence 06/03/2007   Qualifier: Diagnosis of  By: Lindwood Qua CMA, Jerl Santos     Past Surgical History:  Procedure Laterality Date   BLADDER NECK SUSPENSION  1998   BREAST BIOPSY Left    CATARACT EXTRACTION Right 01/2020   CATARACT EXTRACTION W/PHACO Left 08/12/2020   Procedure: CATARACT EXTRACTION PHACO AND INTRAOCULAR LENS PLACEMENT LEFT EYE;  Surgeon: Fabio Pierce, MD;  Location: [facility];  Service: Ophthalmology;  Laterality: Left;  left CDE=10.71   COLONOSCOPY  05/18/2011/10/12/14   Dr. Stan Head   ECTOPIC PREGNANCY SURGERY  1980   ESOPHAGOGASTRODUODENOSCOPY (EGD) WITH ESOPHAGEAL DILATION  04/03/2012   Dr. 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)   Grown children   Never smoker no alcohol tobacco or drug use   family history includes Arrhythmia in her brother; Barrett's esophagus in her maternal aunt and mother; Breast cancer in her sister; Colon cancer in her cousin and another family member; Colon cancer (age of onset: 21) in her son; Colon cancer (age of  onset: 74) in her brother; Colon polyps in her sister; Congestive Heart Failure in her maternal grandmother; Coronary artery disease in her mother; Diabetes in her mother; Diabetes type II in her mother; Ehlers-Danlos syndrome in her daughter; Heart attack (age of onset: 52) in her mother; Melanoma in her maternal uncle; Other in her son; Prostate cancer in her maternal grandfather; Skin cancer in her maternal aunt and mother; Sudden death in her mother; Sudden death (age of onset: 54) in her father; Transient ischemic attack in her mother; Tuberculosis in her paternal grandfather.   Review of Systems  As per HPI Objective:   Physical Exam BP 130/80   Pulse 71  Ht  (1.575 m)   Wt 194 lb 12.8 oz (88.4 kg)   LMP 05/29/1996   BMI 35.63 kg/m  NAD Anxious/tearful Anicteric Abd soft obese, sl RLQ tenderness (chronic), no HSM/mass Ext no edema Skin - sl varicosities RUQ but no otherstigmata CLD (no spider angiomata or other prominent abd wall vv)

## 2022-05-17 ENCOUNTER — Telehealth: Payer: Self-pay | Admitting: Internal Medicine

## 2022-05-17 NOTE — Telephone Encounter (Signed)
PT is calling to discuss lab results. Please advise. 

## 2022-05-18 NOTE — Telephone Encounter (Signed)
Rachel Carlson has spoken to the pt and provided lab results. She is aware we will call her as soon as able with [place]

## 2022-05-23 ENCOUNTER — Ambulatory Visit (HOSPITAL_COMMUNITY)
Admission: RE | Admit: 2022-05-23 | Discharge: 2022-05-23 | Disposition: A | Discharge status: Home / Self Care | Payer: Medicare Other | Source: Ambulatory Visit | Attending: Internal Medicine | Admitting: Internal Medicine

## 2022-05-23 ENCOUNTER — Other Ambulatory Visit: Payer: Self-pay | Admitting: Internal Medicine

## 2022-05-23 DIAGNOSIS — R932 Abnormal findings on diagnostic imaging of liver and biliary tract: Secondary | ICD-10-CM

## 2022-05-23 DIAGNOSIS — K5130 Ulcerative (chronic) rectosigmoiditis without complications: Secondary | ICD-10-CM | POA: Insufficient documentation

## 2022-05-23 DIAGNOSIS — R188 Other ascites: Secondary | ICD-10-CM | POA: Diagnosis not present

## 2022-05-28 ENCOUNTER — Ambulatory Visit (INDEPENDENT_AMBULATORY_CARE_PROVIDER_SITE_OTHER): Payer: Medicare Other | Admitting: Obstetrics and Gynecology

## 2022-05-28 ENCOUNTER — Encounter: Payer: Self-pay | Admitting: Obstetrics and Gynecology

## 2022-05-28 VITALS — BP 126/80 | HR 86

## 2022-05-28 DIAGNOSIS — R350 Frequency of micturition: Secondary | ICD-10-CM | POA: Diagnosis not present

## 2022-05-28 DIAGNOSIS — N3281 Overactive bladder: Secondary | ICD-10-CM

## 2022-05-28 LAB — POCT URINALYSIS DIPSTICK
Bilirubin, UA: NEGATIVE
Blood, UA: NEGATIVE
Glucose, UA: NEGATIVE
Ketones, UA: NEGATIVE
Leukocytes, UA: NEGATIVE
Nitrite, UA: NEGATIVE
Protein, UA: NEGATIVE
Spec Grav, UA: >=1.03 — AB (ref 1.010–1.025)
Urobilinogen, UA: 0.2 E.U./dL
pH, UA: 6 (ref 5.0–8.0)

## 2022-05-28 MED ORDER — LIDOCAINE HCL URETHRAL/MUCOSAL 2 % EX GEL
1.0000 | Freq: Once | CUTANEOUS | Status: AC
  Administered 2022-05-28: 1 via URETHRAL

## 2022-05-28 MED ORDER — CIPROFLOXACIN HCL 500 MG PO TABS
500.0000 mg | ORAL_TABLET | Freq: Once | ORAL | Status: AC
  Administered 2022-05-28: 500 mg via ORAL

## 2022-05-28 MED ORDER — ONABOTULINUMTOXINA 100 UNITS IJ SOLR
200.0000 [IU] | Freq: Once | INTRAMUSCULAR | Status: AC
  Administered 2022-05-28: 200 [IU] via INTRAMUSCULAR

## 2022-05-28 MED ORDER — LIDOCAINE HCL 2 % IJ SOLN
50.0000 mL | Freq: Once | INTRAMUSCULAR | Status: AC
  Administered 2022-05-28: 1000 mg

## 2022-05-28 NOTE — Progress Notes (Signed)
Intravesical Botox Procedure:  77 y.o. yo F with OAB presenting today for intravesical botox.   Vitals:   05/28/22 1117  BP: 126/80  Pulse: 86    POC urine: negative  Prior to the procedure, the patient took ciprofloxacin for antibiotic prophylaxis. Time out was performed. The bladder was catherized and 50 ml of 2% lidocaine was placed in the bladder and 10 ml of 2% lidocaine jelly placed in the urethra. Cystoscopy was performed with sterile H2O and a 70 degree scope. Bladder mucosa was noted to be within normal limits. A total of 15 ml / 150 units of Botox A,  Lot # [id number] Exp 6/26  was injected in the detrusor muscle via 8 injections of 2ml each. These were spaced about 1 cm apart. Care was taken to avoid the ureteral orifices and the trigone. Patient tolerated the procedure well.  Impression: 77 y.o. 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 Patient tolerated procedure well.  Plan: Post-procedure instructions given regarding bleeding, infection, urinary retention.  Self-catheterization teaching was previously performed.   Patient will follow up in 4 weeks All questions answered.  Marguerita Beards, MD

## 2022-05-28 NOTE — Patient Instructions (Signed)
Taking Care of Yourself after Urodynamics, Cystoscopy, Bulkamid Injection, or Botox Injection   Drink plenty of water for a day or two following your procedure. Try to have about 8 ounces (one cup) at a time, and do this 6 times or more per day unless you have fluid restrictitons AVOID irritative beverages such as coffee, tea, soda, alcoholic or citrus drinks for a day or two, as this may cause burning with urination.  For the first 1-2 days after the procedure, your urine may be pink or red in color. You may have some blood in your urine as a normal side effect of the procedure. Large amounts of bleeding or difficulty urinating are NOT normal. Call the nurse line if this happens or go to the nearest Emergency Room if the bleeding is heavy or you cannot urinate at all and it is after hours.  You may experience some discomfort or a burning sensation with urination after having this procedure. You can use over the counter Azo or pyridium to help with burning and follow the instructions on the packaging. If it does not improve within 1-2 days, or other symptoms appear (fever, chills, or difficulty urinating) call the office to speak to a nurse.  You may return to normal daily activities such as work, school, driving, exercising and housework on the day of the procedure. If your doctor gave you a prescription, take it as ordered.     

## 2022-05-31 ENCOUNTER — Telehealth: Payer: Self-pay | Admitting: Internal Medicine

## 2022-05-31 DIAGNOSIS — R932 Abnormal findings on diagnostic imaging of liver and biliary tract: Secondary | ICD-10-CM

## 2022-05-31 NOTE — Telephone Encounter (Signed)
Patient called requesting a call back to discuss her recent imaging results from 0424. Please advise, thank you.

## 2022-06-01 NOTE — Telephone Encounter (Signed)
I spoke with scheduling and they said they were unable to do the elastography on 05/23/22 because she had too much free fluid. I have rescheduled it for Monday May 20th at 8:30AM, arrive at 8:15AM at [facility]. NPO midnight to prep for this. I called to tell her and she had just left her husband said. He will have her call me.

## 2022-06-01 NOTE — Telephone Encounter (Signed)
Patient called to return phone call. 

## 2022-06-01 NOTE — Telephone Encounter (Signed)
Debby informed about appointment date/time. She requested that I mail her the U/S report done on 05/23/2022. I confirmed address and it will go out Monday to her.

## 2022-06-01 NOTE — Telephone Encounter (Signed)
I called her with the results.  We had wanted an elastography as well as the complete abdominal ultrasound.  We need to arrange this with radiology please.  Not urgent.

## 2022-06-08 ENCOUNTER — Ambulatory Visit
Admission: RE | Admit: 2022-06-08 | Discharge: 2022-06-08 | Disposition: A | Discharge status: Home / Self Care | Payer: Medicare Other | Source: Ambulatory Visit | Attending: Interventional Radiology | Admitting: Interventional Radiology

## 2022-06-08 DIAGNOSIS — I863 Vulval varices: Secondary | ICD-10-CM

## 2022-06-08 DIAGNOSIS — I8390 Asymptomatic varicose veins of unspecified lower extremity: Secondary | ICD-10-CM | POA: Diagnosis not present

## 2022-06-08 NOTE — Progress Notes (Signed)
Chief Complaint: Patient was consulted remotely today (TeleHealth) for follow up status post sclerotherapy of right labial varicosities.  History of Present Illness: Rachel Carlson is a 77 y.o. 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 Ultrasound guided sclerotherapy of the previous bleeding varix as well as additional varicosity of the right labial region was performed on 05/10/22.  After treatment, Rachel Carlson states that the varix that had bled previously did spot bleed for about a week.  She squeezed it periodically and ultimately the bleeding stopped.  The protruding varix is now hard and dry and smaller than it had been previously.  She has no significant discomfort in the region of treatment and denies any skin ulceration.  Past Medical History:  Diagnosis Date   Blood transfusion without reported diagnosis    Chronic right ear pain    normal MRI   Esophageal stricture    Family history of breast cancer    Family history of colon cancer    Family history of melanoma    Family history of prostate cancer    Fibroids    uterine, history of   GERD (gastroesophageal reflux disease)    History of uterine prolapse    Hx of adenomatous polyp of colon 10/19/2014   Hyperlipidemia    Obesity    PVC (premature ventricular contraction)    first dx by holter in 1980's, echo (6/10) EF 55-60%, normal diastolic fxn, normal size RV and fxn, mild MR, PASP   Squamous cell carcinoma of arm    Tubal pregnancy    Ulcerative proctosigmoiditis (HCC)    Urge urinary incontinence 06/03/2007   Qualifier: Diagnosis of  By: Lindwood Qua CMA, Jerl Santos      Past Surgical History:  Procedure Laterality Date   BLADDER NECK SUSPENSION   1998   BREAST BIOPSY Left    CATARACT EXTRACTION Right 01/2020   CATARACT EXTRACTION W/PHACO Left 08/12/2020   Procedure: CATARACT EXTRACTION PHACO AND INTRAOCULAR LENS PLACEMENT LEFT EYE;  Surgeon: Fabio Pierce, MD;  Location: [facility];  Service: Ophthalmology;  Laterality: Left;  left CDE=10.71   COLONOSCOPY  05/18/2011/10/12/14   Dr. Stan Head   ECTOPIC PREGNANCY SURGERY  1980   ESOPHAGOGASTRODUODENOSCOPY (EGD) WITH ESOPHAGEAL DILATION  04/03/2012   Dr. Stan Head   PARTIAL HYSTERECTOMY     abdominal, fibroids, and prolapse (1998) bladder tack   SKIN CANCER EXCISION     UPPER GASTROINTESTINAL ENDOSCOPY      Allergies: Pantoprazole sodium, Ranitidine, Omeprazole, and Sulfasalazine  Medications: Prior to Admission medications   Medication Sig Start Date End Date Taking? Authorizing Provider  Ascorbic Acid (VITAMIN C) 1000 MG tablet Take 1,000 mg by mouth daily.    [provider]  aspirin 81 MG tablet Take 81 mg by mouth daily.      [provider]  calcium citrate-vitamin D (CITRACAL+D) 315-200 MG-UNIT per tablet Take 1 tablet by mouth 2 (two) times daily.    [provider]  Cholecalciferol (VITAMIN D-3) 5000 UNITS TABS Take 5,000 Units by mouth daily.    [provider]  Coenzyme Q10 (CO Q-10) 200 MG CAPS Take 200 mg by mouth daily.    [provider]  CRANBERRY PO Take 650 mg by mouth daily.    [provider]  estradiol (ESTRACE) 0.1 MG/GM vaginal cream Place 0.5 g vaginally 2 (two) times a week. 01/18/22   Marguerita Beards, MD  Glucosamine-Chondroitin (GLUCOSAMINE CHONDR COMPLEX PO) Take 2 capsules by mouth daily.     [provider]  GRAPE SEED EXTRACT PO Take 1 capsule by mouth daily.    [provider]  hydrocortisone (ANUSOL-HC) 2.5 % rectal cream Place 1 Application rectally 2 (two) times daily. 10/13/21   Iva Boop, MD  hydrocortisone Laurance Flatten) 100 MG/60ML enema Use 60ml every night  for 28 days 10/20/21   Unk Lightning, PA  ketoconazole (NIZORAL) 2 % cream Apply 1 Application topically daily. To yeast rash 08/17/21   Tower, Audrie Gallus, MD  lansoprazole (PREVACID) 15 MG capsule Take 1 capsule (15 mg total) by mouth 2 (two) times daily before a meal. Patient taking differently: Take 15 mg by mouth daily. Uses over the counter brand 09/02/17   Iva Boop, MD  LIALDA 1.2 g EC tablet Take 2 tablets by mouth twice daily 10/18/21   Iva Boop, MD  lidocaine-prilocaine (EMLA) cream Use on vulva/ urethra as needed for pain. 04/06/22   Marguerita Beards, MD  meclizine (ANTIVERT) 25 MG tablet Take 1 tablet (25 mg total) by mouth 3 (three) times daily as needed for dizziness or nausea (vertigo). 01/03/22   Tower, Audrie Gallus, MD  milk thistle 175 MG tablet Take 175 mg by mouth daily.    [provider]  Omega-3 Fatty Acids (FISH OIL TRIPLE STRENGTH PO) Take 1 capsule by mouth daily.    [provider]  Polyethyl Glycol-Propyl Glycol (SYSTANE OP) Place 1 drop into both eyes daily.    [provider]  rosuvastatin (CRESTOR) 20 MG tablet Take 1 tablet (20 mg total) by mouth at bedtime. 08/17/21   Tower, Audrie Gallus, MD  vitamin B-12 (CYANOCOBALAMIN) 1000 MCG tablet Take 1,000 mcg by mouth daily.    [provider]  zinc gluconate 50 MG tablet Take 50 mg by mouth daily.    [provider]     Family History  Problem Relation Age of Onset   Sudden death Father 98       "coronary arteriosclerosis" on death certificate   Heart attack Mother 98   Transient ischemic attack Mother    Diabetes type II Mother    Sudden death Mother        died age 23   Diabetes Mother    Coronary artery disease Mother    Skin cancer Mother    Barrett's esophagus Mother    Arrhythmia Brother    Colon cancer Brother 87   Breast cancer Sister        dx in her 48s   Colon polyps Sister        adenomatous   Colon cancer Son 34       lynch syndrome    Melanoma Maternal Uncle    Congestive Heart Failure Maternal Grandmother    Prostate cancer Maternal Grandfather        dx in 29s   Tuberculosis Paternal Grandfather    Skin cancer Maternal Aunt    Barrett's esophagus Maternal Aunt    Other Son        parotid gland tumor   Ehlers-Danlos syndrome Daughter    Colon cancer Cousin        mid 44s; maternal cousin   Colon cancer Other        MGMs brother   Esophageal cancer  Neg Hx    Stomach cancer Neg Hx    Rectal cancer Neg Hx     Social History   Socioeconomic History   Marital status: Married    Spouse name: Not on file   Number of children: 3   Years of education: Not on file   Highest education level: Not on file  Occupational History   Occupation: International aid/development worker    Comment:      Employer: unemployed  Tobacco Use   Smoking status: Never   Smokeless tobacco: Never  Vaping Use   Vaping Use: Never used  Substance and Sexual Activity   Alcohol use: No    Alcohol/week: 0.0 standard drinks of alcohol    Comment: rarely beer   Drug use: No   Sexual activity: Not Currently  Other Topics Concern   Not on file  Social History Narrative   Retired International aid/development worker, 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)   Grown children   Never smoker no alcohol tobacco or drug use   Social Determinants of Health   Financial Resource Strain: Low Risk  (08/25/2021)   Overall Financial Resource Strain (CARDIA)    Difficulty of Paying Living Expenses: Not hard at all  Food Insecurity: No Food Insecurity (08/25/2021)   Hunger Vital Sign    Worried About Running Out of Food in the Last Year: Never true    Ran Out of Food in the Last Year: Never true  Transportation Needs: No Transportation Needs (08/25/2021)   PRAPARE - Administrator, Civil Service (Medical): No    Lack of Transportation (Non-Medical): No  Physical Activity: Inactive (08/25/2021)   Exercise Vital Sign    Days of Exercise per Week: 0 days    Minutes of Exercise per  Session: 0 min  Stress: No Stress Concern Present (08/25/2021)   [employer] of Occupational Health - Occupational Stress Questionnaire    Feeling of Stress : Not at all  Social Connections: Socially Integrated (08/25/2021)   Social Connection and Isolation Panel [NHANES]    Frequency of Communication with Friends and Family: More than three times a week    Frequency of Social Gatherings with Friends and Family: More than three times a week    Attends Religious Services: More than 4 times per year    Active Member of [employer] or Organizations: Yes    Attends Engineer, structural: More than 4 times per year    Marital Status: Married     Review of Systems  Genitourinary: Negative.   Skin: Negative.     Review of Systems: A 12 point ROS discussed and pertinent positives are indicated in the HPI above.  All other systems are negative.   Physical Exam No direct physical exam was performed (except for noted visual exam findings with Video Visits).   Vital Signs: LMP 05/29/1996   Imaging: US Abdomen Complete  Result Date: 05/26/2022 CLINICAL DATA:  Cirrhosis EXAM: ABDOMEN ULTRASOUND COMPLETE COMPARISON:  None Available. FINDINGS: Gallbladder: No gallstones or wall thickening visualized. No sonographic Murphy sign noted by sonographer. Common bile duct: Diameter: 0.4 cm. Liver: No focal lesion identified. Within normal limits in parenchymal echogenicity. Hepatopetal portal vein. IVC: No abnormality visualized. Pancreas: Visualized portion unremarkable. Spleen: Size and appearance within normal limits. Right Kidney: Length: 11.6 cm. Echogenicity within normal limits. No mass or hydronephrosis visualized. Left Kidney: Length: 11.5 cm. Echogenicity within normal limits. No mass or hydronephrosis visualized. Abdominal aorta: No aneurysm visualized. Other findings: Small  ascites around the liver. IMPRESSION: Small ascites.  Otherwise unremarkable. Electronically Signed   By: Layla Maw M.D.   On: 05/26/2022 20:31   [place] Injec Sclerotherapy Mult  Result Date: 05/10/2022 INDICATION: Symptomatic bleeding varicosity of the right labia. EXAM: ULTRASOUND GUIDED INJECTION OF SCLEROSANT MEDICATIONS: None. ANESTHESIA/SEDATION: None. COMPLICATIONS: None immediate. PROCEDURE: Informed written consent was obtained from the patient after a thorough discussion of the procedural risks, benefits and alternatives. All questions were addressed. Maximal Sterile Barrier Technique was utilized including caps, mask, sterile gowns, sterile gloves, sterile drape, hand hygiene and skin antiseptic. A timeout was performed prior to the initiation of the procedure. Ultrasound was performed of the right labial region. The region was then prepped with chlorhexidine and draped in standard fashion. A volume of 2 mL of 1% polidocanol was foamed with an equal volume of air. This sclerosant was utilized in performing ultrasound-guided sclerotherapy with a 25 gauge needle of a prominent subcutaneous vein in the right labial region as well as a protruding varicosity of the right labia. FINDINGS: Initial ultrasound demonstrates a network of subcutaneous veins in the region of the right labia. One of these veins was chosen for sclerotherapy and directly injected with sclerosant under ultrasound guidance. Ultrasound demonstrates spread of sclerosant throughout communicating veins. Injection of sclerosant was then performed of a protruding varicosity of the right labia which has been the source of prior bleeding episodes. IMPRESSION: Ultrasound-guided sclerotherapy performed of right labial varicosities. Electronically Signed   By: Irish Lack M.D.   On: 05/10/2022 12:32   [place] RAD EVAL AND MGMT  Result Date: 05/10/2022 Please refer to "Notes" to see consult details.   Labs:  CBC: Recent Labs    08/11/21 0939 05/15/22 1125  WBC 4.2 6.1  HGB 13.6 13.3  HCT 40.2 39.9  PLT 190.0 246.0    COAGS: Recent Labs     05/15/22 1125  INR 1.1*    BMP: Recent Labs    08/11/21 0939 05/15/22 1125  NA 138 139  K 3.9 4.3  CL 106 106  CO2 25 26  GLUCOSE 105* 105*  BUN 17 10  CALCIUM 9.7 9.5  CREATININE 0.70 0.63    LIVER FUNCTION TESTS: Recent Labs    08/11/21 0939 05/15/22 1125  BILITOT 0.7 0.6  AST 17 14  ALT 16 10  ALKPHOS 55 63  PROT 7.2 7.1  ALBUMIN 4.6 4.4    Assessment and Plan:  Ms. Noller is doing well following ultrasound-guided sclerotherapy of a symptomatic right labial varicosity with previous episodes of bleeding.  After some initial spotting from the varicosity after sclerotherapy, bleeding has stopped and the varicosity is noted to be smaller, hard and dry.  Findings are consistent with thrombosis.  I told Ms. Yuh to call me for follow-up should she have any recurrent bleeding or notice other symptomatic varicosities.   Electronically Signed: Reola Calkins 06/08/2022, 8:15 AM    I spent a total of 10 Minutes in remote  clinical consultation, greater than 50% of which was counseling/coordinating care post sclerotherapy of symptomatic and bleeding right labial varicosities.    Visit type: Audio only (telephone). Audio (no video) only due to patient's lack of internet/smartphone capability. Alternative for in-person consultation at [facility], [address] [place], [place], [place]. This visit type was conducted due to national recommendations for restrictions regarding the COVID-19 Pandemic (e.g. social distancing).  This format is felt to be most appropriate for this patient at this time.  All issues  noted in this document were discussed and addressed.

## 2022-06-18 ENCOUNTER — Ambulatory Visit (HOSPITAL_COMMUNITY)
Admission: RE | Admit: 2022-06-18 | Discharge: 2022-06-18 | Disposition: A | Discharge status: Home / Self Care | Payer: Medicare Other | Source: Ambulatory Visit | Attending: Internal Medicine | Admitting: Internal Medicine

## 2022-06-18 DIAGNOSIS — R932 Abnormal findings on diagnostic imaging of liver and biliary tract: Secondary | ICD-10-CM

## 2022-06-18 DIAGNOSIS — R7989 Other specified abnormal findings of blood chemistry: Secondary | ICD-10-CM | POA: Diagnosis not present

## 2022-06-19 ENCOUNTER — Telehealth: Payer: Self-pay | Admitting: Internal Medicine

## 2022-06-19 DIAGNOSIS — R188 Other ascites: Secondary | ICD-10-CM

## 2022-06-19 MED ORDER — SPIRONOLACTONE 25 MG PO TABS: 25.0000 mg | ORAL_TABLET | Freq: Every day | ORAL | 2 refills | Status: DC

## 2022-06-19 NOTE — Telephone Encounter (Signed)
Patient called to return phone call regarding recent imaging results. Please advise, thank you.

## 2022-06-19 NOTE — Telephone Encounter (Signed)
I called the patient and reviewed that her elastography did not show cirrhosis.  She feels bloated and distended there is some slight ascites so I am going to treat her for that.  She knows to come for a be met the week of June 3.  Further plans pending that.  No need to schedule follow-up at this time.  Meds ordered this encounter  Medications   spironolactone (ALDACTONE) 25 MG tablet    Sig: Take 1 tablet (25 mg total) by mouth daily.    Dispense:  30 tablet    Refill:  2   Orders Placed This Encounter  Procedures   Basic Metabolic Panel (BMET)

## 2022-06-20 NOTE — Telephone Encounter (Signed)
Noted  

## 2022-06-25 NOTE — Progress Notes (Signed)
[facility] Return Visit  SUBJECTIVE  History of Present Illness: LYLIA WARRENS is a 77 y.o. female seen in follow-up for OAB. Plan at last visit was undergo bladder botox injection.   Reports the 150units has helped with her urgency. She reports she does not feel like she is retention. She reports she gets an urge still but she is able to make it to the bathroom. Also reports her frequency has decreased.    Past Medical History: Patient  has a past medical history of Blood transfusion without reported diagnosis, Chronic right ear pain, Esophageal stricture, Family history of breast cancer, Family history of colon cancer, Family history of melanoma, Family history of prostate cancer, Fibroids, GERD (gastroesophageal reflux disease), History of uterine prolapse, adenomatous polyp of colon (10/19/2014), Hyperlipidemia, Obesity, PVC (premature ventricular contraction), Squamous cell carcinoma of arm, Tubal pregnancy, Ulcerative proctosigmoiditis (HCC), and Urge urinary incontinence (06/03/2007).   Past Surgical History: She  has a past surgical history that includes Ectopic pregnancy surgery (1980); Partial hysterectomy; Bladder neck suspension (1998); Esophagogastroduodenoscopy (egd) with esophageal dilation (04/03/2012); Upper gastrointestinal endoscopy; Breast biopsy (Left); Colonoscopy (05/18/2011/10/12/14); Cataract extraction (Right, 01/2020); Cataract extraction w/PHACO (Left, 08/12/2020); and Skin cancer excision.   Medications: She has a current medication list which includes the following prescription(s): vitamin c, aspirin, calcium citrate-vitamin d, vitamin d-3, co q-10, cranberry, estradiol, glucosamine-chondroitin, grape seed, hydrocortisone, hydrocortisone, ketoconazole, lansoprazole, lialda, lidocaine-prilocaine, meclizine, milk thistle, omega-3 fatty acids, polyethyl glycol-propyl glycol, rosuvastatin, spironolactone, cyanocobalamin, and zinc gluconate.    Allergies: Patient is allergic to pantoprazole sodium, ranitidine, omeprazole, and sulfasalazine.   Social History: Patient  reports that she has never smoked. She has never used smokeless tobacco. She reports that she does not drink alcohol and does not use drugs.      OBJECTIVE     Physical Exam: Vitals:   06/26/22 1302 06/26/22 1304  BP: (!) 147/85 110/77  Pulse: 89 91   Gen: No apparent distress, A&O x 3.  Detailed Urogynecologic Evaluation:  Deferred.    ASSESSMENT AND PLAN    Ms. Mares is a 78 y.o. with:  1. Overactive bladder   2. Urinary frequency    Patient is satisfied with her bladder botox. Will plan for patient to have repeat injection at 6 months.  Has reduced with the bladder botox.   Plan to repeat Bladder botox in October. Will have patient call if any issues prior to that.

## 2022-06-26 ENCOUNTER — Encounter: Payer: Self-pay | Admitting: Obstetrics and Gynecology

## 2022-06-26 ENCOUNTER — Ambulatory Visit (INDEPENDENT_AMBULATORY_CARE_PROVIDER_SITE_OTHER): Payer: Medicare Other | Admitting: Obstetrics and Gynecology

## 2022-06-26 VITALS — BP 110/77 | HR 91

## 2022-06-26 DIAGNOSIS — R350 Frequency of micturition: Secondary | ICD-10-CM | POA: Diagnosis not present

## 2022-06-26 DIAGNOSIS — N3281 Overactive bladder: Secondary | ICD-10-CM

## 2022-07-02 ENCOUNTER — Other Ambulatory Visit (INDEPENDENT_AMBULATORY_CARE_PROVIDER_SITE_OTHER): Payer: Medicare Other

## 2022-07-02 DIAGNOSIS — R188 Other ascites: Secondary | ICD-10-CM | POA: Diagnosis not present

## 2022-07-02 LAB — BASIC METABOLIC PANEL
BUN: 8 mg/dL (ref 6–23)
CO2: 23 mEq/L (ref 19–32)
Calcium: 9.5 mg/dL (ref 8.4–10.5)
Chloride: 104 mEq/L (ref 96–112)
Creatinine, Ser: 0.63 mg/dL (ref 0.40–1.20)
GFR: 87.32 mL/min (ref >60.00)
Glucose, Bld: 96 mg/dL (ref 70–99)
Potassium: 4.1 mEq/L (ref 3.5–5.1)
Sodium: 137 mEq/L (ref 135–145)

## 2022-07-04 ENCOUNTER — Telehealth: Payer: Self-pay | Admitting: Internal Medicine

## 2022-07-04 DIAGNOSIS — R188 Other ascites: Secondary | ICD-10-CM

## 2022-07-04 MED ORDER — SPIRONOLACTONE 50 MG PO TABS: 50.0000 mg | ORAL_TABLET | Freq: Every day | ORAL | 3 refills | Status: DC

## 2022-07-04 NOTE — Telephone Encounter (Signed)
Patient has reported transient decrease in swelling and weight with 25 mg spironolactone daily.  Recent BMET was normal  Notify patient increase spironolactone to 50 mg daily.  She can take 2 of the 25's and I have also sent a prescription for 50.  Recheck BMET in early July I have ordered it please tell her to come at that time

## 2022-07-05 NOTE — Telephone Encounter (Signed)
Pt was notified of Dr. Leone Payor recommendations. Pt notified to return to the lab in early July. Pt verbalized understanding with all questions answered.

## 2022-07-10 ENCOUNTER — Other Ambulatory Visit: Payer: Self-pay | Admitting: Internal Medicine

## 2022-07-23 ENCOUNTER — Encounter: Payer: Self-pay | Admitting: Family Medicine

## 2022-07-23 ENCOUNTER — Ambulatory Visit (INDEPENDENT_AMBULATORY_CARE_PROVIDER_SITE_OTHER): Payer: Medicare Other | Admitting: Family Medicine

## 2022-07-23 VITALS — BP 110/66 | HR 94 | Temp 97.5°F | Ht 61.5 in | Wt 185.4 lb

## 2022-07-23 DIAGNOSIS — R1900 Intra-abdominal and pelvic swelling, mass and lump, unspecified site: Secondary | ICD-10-CM | POA: Diagnosis not present

## 2022-07-23 DIAGNOSIS — I83813 Varicose veins of bilateral lower extremities with pain: Secondary | ICD-10-CM | POA: Diagnosis not present

## 2022-07-23 DIAGNOSIS — M7989 Other specified soft tissue disorders: Secondary | ICD-10-CM

## 2022-07-23 DIAGNOSIS — K5130 Ulcerative (chronic) rectosigmoiditis without complications: Secondary | ICD-10-CM

## 2022-07-23 DIAGNOSIS — K219 Gastro-esophageal reflux disease without esophagitis: Secondary | ICD-10-CM | POA: Diagnosis not present

## 2022-07-23 NOTE — Patient Instructions (Addendum)
I will review your GI records and imaging and get back to you   I want to get an ultrasound of the left leg  Elevate your leg Use a warm compress Wear support garment  If worse or severe pain or if you get short of breath go to the ER   Take care of yourself

## 2022-07-23 NOTE — Assessment & Plan Note (Signed)
Focal area of swelling/erythema/ heat and pain  May be superficial thrombophlebitis but need to r/o DVT LE doppler ordered Discussed use of compression Elevate Warm compress as needed   ER precautions noted  See AVS

## 2022-07-23 NOTE — Assessment & Plan Note (Signed)
Per pt stable on lialda  Sees Dr Leone Payor Having more issues with upper abd bloating/swelling

## 2022-07-23 NOTE — Progress Notes (Signed)
Subjective:    Patient ID: Rachel Carlson, female    DOB: [birth year], 77 y.o.   MRN: [id number]  HPI  Wt Readings from Last 3 Encounters:  07/23/22 185 lb 6 oz (84.1 kg)  05/15/22 194 lb 12.8 oz (88.4 kg)  05/07/22 198 lb 3.2 oz (89.9 kg)   34.46 kg/m  Vitals:   07/23/22 1420  BP: 110/66  Pulse: 94  Temp: (!) 97.5 F (36.4 C)  SpO2: 96%    Pt presents for swelling of left leg Also GI problems   Has varicose veins  Area on right medial leg is more tender to the touch  Is more red than usual - for about a week  Swelling is only in the focal area (not the whole leg or calf) Has thigh high supp hose- wears prn   Wakes up lighter  Goes to bed heavier    Sees Dr Leone Payor for GI Swelling in abdomen  He started her on spironolactone -no improvement  Had [place] of abdomen - some ascites  ? Cirrhosis   Makes her feel full  Weight is down from 194 to 185 lb-eating less   Lab Results  Component Value Date   ALT 10 05/15/2022   AST 14 05/15/2022   ALKPHOS 63 05/15/2022   BILITOT 0.6 05/15/2022   No fibrosis in liver   Lab Results  Component Value Date   WBC 6.1 05/15/2022   HGB 13.3 05/15/2022   HCT 39.9 05/15/2022   MCV 88.3 05/15/2022   PLT 246.0 05/15/2022    Per record  Pt had CT scan in march that noted nodular contour of liver and trace ascites Then had elastography with per Dr Leone Payor did not show cirrhosis  [place] -normal appearing liver with small amt of ascites    . Lab Results  Component Value Date   CREATININE 0.63 07/02/2022   BUN 8 07/02/2022   NA 137 07/02/2022   K 4.1 07/02/2022   CL 104 07/02/2022   CO2 23 07/02/2022    Occational tylenol-not often Not a drinker   Colonoscopy due in 2026 Colitis is about the same - frequent stools  Takes lialda   GERd Prevacid 15 mg   Doing botox for overactive bladder    Patient Active Problem List   Diagnosis Date Noted   Left leg swelling 07/23/2022   Swelling abdomen 07/23/2022    Vertigo, benign positional 01/03/2022   History of vaginal bleeding 08/17/2021   Visit for routine gyn exam 08/17/2021   Intertrigo 08/17/2021   Nonrheumatic mitral valve regurgitation 04/16/2019   Essential hypertension 04/11/2018   PVC's (premature ventricular contractions) 04/11/2018   Class 2 severe obesity with serious comorbidity and body mass index (BMI) of 36.0 to 36.9 in adult [facility]) 12/06/2017   Genetic testing 09/26/2017   Family history of breast cancer    Family history of colon cancer    Family history of prostate cancer    Family history of melanoma    Family history of Lynch syndrome - son 08/04/2017   Prediabetes 07/20/2015   Estrogen deficiency 04/27/2015   GERD (gastroesophageal reflux disease) 03/23/2015   Hx of adenomatous polyp of colon 10/19/2014   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   Pain in joint, pelvic region and thigh 07/24/2014   Need for hepatitis C screening test 07/12/2014   Pelvic pain in female 07/12/2014   Vitamin D deficiency 07/03/2014   Encounter for Medicare annual wellness exam 07/08/2013  Osteoporosis 05/24/2013   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   Detrusor muscle hypertonia 11/15/2010   Adnexal pain 11/15/2010   Urge incontinence 11/15/2010   Family history of coronary artery disease 10/15/2010   Routine general medical examination at a health care facility 06/01/2010   FASCIITIS, PLANTAR 06/06/2009   Hyperlipidemia 06/03/2007   Urge urinary incontinence 06/03/2007   MIGRAINES, HX OF 06/03/2007   Past Medical History:  Diagnosis Date   Blood transfusion without reported diagnosis    Chronic right ear pain    normal MRI   Esophageal stricture    Family history of breast cancer    Family history of colon cancer    Family history of melanoma    Family history of prostate cancer    Fibroids    uterine, history of   GERD (gastroesophageal reflux disease)    History of uterine  prolapse    Hx of adenomatous polyp of colon 10/19/2014   Hyperlipidemia    Obesity    PVC (premature ventricular contraction)    first dx by holter in 1980's, echo (6/10) EF 55-60%, normal diastolic fxn, normal size RV and fxn, mild MR, PASP   Squamous cell carcinoma of arm    Tubal pregnancy    Ulcerative proctosigmoiditis (HCC)    Urge urinary incontinence 06/03/2007   Qualifier: Diagnosis of  By: Lindwood Qua CMA, Jerl Santos     Past Surgical History:  Procedure Laterality Date   BLADDER NECK SUSPENSION  1998   BREAST BIOPSY Left    CATARACT EXTRACTION Right 01/2020   CATARACT EXTRACTION W/PHACO Left 08/12/2020   Procedure: CATARACT EXTRACTION PHACO AND INTRAOCULAR LENS PLACEMENT LEFT EYE;  Surgeon: Fabio Pierce, MD;  Location: [facility];  Service: Ophthalmology;  Laterality: Left;  left CDE=10.71   COLONOSCOPY  05/18/2011/10/12/14   Dr. Stan Head   ECTOPIC PREGNANCY SURGERY  1980   ESOPHAGOGASTRODUODENOSCOPY (EGD) WITH ESOPHAGEAL DILATION  04/03/2012   Dr. Stan Head   PARTIAL HYSTERECTOMY     abdominal, fibroids, and prolapse (1998) bladder tack   SKIN CANCER EXCISION     UPPER GASTROINTESTINAL ENDOSCOPY     Social History   Tobacco Use   Smoking status: Never   Smokeless tobacco: Never  Vaping Use   Vaping Use: Never used  Substance Use Topics   Alcohol use: No    Alcohol/week: 0.0 standard drinks of alcohol    Comment: rarely beer   Drug use: No   Family History  Problem Relation Age of Onset   Sudden death Father 15       "coronary arteriosclerosis" on death certificate   Heart attack Mother 74   Transient ischemic attack Mother    Diabetes type II Mother    Sudden death Mother        died age 68   Diabetes Mother    Coronary artery disease Mother    Skin cancer Mother    Barrett's esophagus Mother    Arrhythmia Brother    Colon cancer Brother 11   Breast cancer Sister        dx in her 34s   Colon polyps Sister        adenomatous   Colon  cancer Son 37       lynch syndrome   Melanoma Maternal Uncle    Congestive Heart Failure Maternal Grandmother    Prostate cancer Maternal Grandfather        dx in 43s   Tuberculosis Paternal Grandfather  Skin cancer Maternal Aunt    Barrett's esophagus Maternal Aunt    Other Son        parotid gland tumor   Ehlers-Danlos syndrome Daughter    Colon cancer Cousin        mid 33s; maternal cousin   Colon cancer Other        MGMs brother   Esophageal cancer Neg Hx    Stomach cancer Neg Hx    Rectal cancer Neg Hx    Allergies  Allergen Reactions   Pantoprazole Sodium Diarrhea    Abdominal pain   Ranitidine Diarrhea    abdominal pain   Omeprazole Diarrhea    Abdominal pain   Sulfasalazine Rash    Fever, chills, headache, muscle pain   Current Outpatient Medications on File Prior to Visit  Medication Sig Dispense Refill   Ascorbic Acid (VITAMIN C) 1000 MG tablet Take 1,000 mg by mouth daily.     aspirin 81 MG tablet Take 81 mg by mouth daily.       calcium citrate-vitamin D (CITRACAL+D) 315-200 MG-UNIT per tablet Take 1 tablet by mouth 2 (two) times daily.     Cholecalciferol (VITAMIN D-3) 5000 UNITS TABS Take 5,000 Units by mouth daily.     Coenzyme Q10 (CO Q-10) 200 MG CAPS Take 200 mg by mouth daily.     CRANBERRY PO Take 650 mg by mouth daily.     estradiol (ESTRACE) 0.1 MG/GM vaginal cream Place 0.5 g vaginally 2 (two) times a week. 42.5 g 11   Glucosamine-Chondroitin (GLUCOSAMINE CHONDR COMPLEX PO) Take 2 capsules by mouth daily.      GRAPE SEED EXTRACT PO Take 1 capsule by mouth daily.     hydrocortisone (ANUSOL-HC) 2.5 % rectal cream Place 1 Application rectally 2 (two) times daily. 30 g 1   hydrocortisone (CORTENEMA) 100 MG/60ML enema Use 60ml every night for 28 days 1680 mL 0   ketoconazole (NIZORAL) 2 % cream Apply 1 Application topically daily. To yeast rash 30 g 1   lansoprazole (PREVACID) 15 MG capsule Take 1 capsule (15 mg total) by mouth 2 (two) times daily  before a meal. (Patient taking differently: Take 15 mg by mouth daily. Uses over the counter brand)     LIALDA 1.2 g EC tablet Take 2 tablets by mouth twice daily 120 tablet 2   lidocaine-prilocaine (EMLA) cream Use on vulva/ urethra as needed for pain. 30 g 1   meclizine (ANTIVERT) 25 MG tablet Take 1 tablet (25 mg total) by mouth 3 (three) times daily as needed for dizziness or nausea (vertigo). 20 tablet 0   milk thistle 175 MG tablet Take 175 mg by mouth daily.     Omega-3 Fatty Acids (FISH OIL TRIPLE STRENGTH PO) Take 1 capsule by mouth daily.     Polyethyl Glycol-Propyl Glycol (SYSTANE OP) Place 1 drop into both eyes daily.     rosuvastatin (CRESTOR) 20 MG tablet Take 1 tablet (20 mg total) by mouth at bedtime. 90 tablet 3   spironolactone (ALDACTONE) 50 MG tablet Take 1 tablet (50 mg total) by mouth daily. 30 tablet 3   vitamin B-12 (CYANOCOBALAMIN) 1000 MCG tablet Take 1,000 mcg by mouth daily.     zinc gluconate 50 MG tablet Take 50 mg by mouth daily.     No current facility-administered medications on file prior to visit.    Review of Systems  Constitutional:  Negative for activity change, appetite change, fatigue, fever and unexpected weight change.  HENT:  Negative for congestion, ear pain, rhinorrhea, sinus pressure and sore throat.   Eyes:  Negative for pain, redness and visual disturbance.  Respiratory:  Negative for cough, shortness of breath and wheezing.   Cardiovascular:  Negative for chest pain and palpitations.  Gastrointestinal:  Negative for abdominal pain, blood in stool, constipation and diarrhea.  Endocrine: Negative for polydipsia and polyuria.  Genitourinary:  Negative for dysuria, frequency and urgency.  Musculoskeletal:  Negative for arthralgias, back pain and myalgias.       Sore spot on leg   Varicose veins  Skin:  Negative for pallor and rash.  Allergic/Immunologic: Negative for environmental allergies.  Neurological:  Negative for dizziness, syncope and  headaches.  Hematological:  Negative for adenopathy. Does not bruise/bleed easily.  Psychiatric/Behavioral:  Negative for decreased concentration and dysphoric mood. The patient is not nervous/anxious.        Objective:   Physical Exam Constitutional:      General: She is not in acute distress.    Appearance: Normal appearance. She is well-developed. She is obese.  HENT:     Head: Normocephalic and atraumatic.  Eyes:     Conjunctiva/sclera: Conjunctivae normal.     Pupils: Pupils are equal, round, and reactive to light.  Neck:     Thyroid: No thyromegaly.     Vascular: No carotid bruit or JVD.  Cardiovascular:     Rate and Rhythm: Normal rate and regular rhythm.     Pulses: Normal pulses.     Heart sounds: Normal heart sounds.     No gallop.  Pulmonary:     Effort: Pulmonary effort is normal. No respiratory distress.     Breath sounds: Normal breath sounds. No wheezing or rales.  Abdominal:     General: There is no abdominal bruit.     Palpations: Abdomen is soft.     Tenderness: There is no abdominal tenderness.  Musculoskeletal:     Cervical back: Normal range of motion and neck supple.     Right lower leg: No edema.     Left lower leg: Edema present.     Comments: Swollen/erythematous area on LLE just below medial knee  2-3 cm -tender to touch and compressible with a palpable cord, mildly warm  No skin interruption or drainage   Baseline varicosities to thigh of different sizes    Lymphadenopathy:     Cervical: No cervical adenopathy.  Skin:    General: Skin is warm and dry.     Coloration: Skin is not pale.     Findings: No rash.  Neurological:     Mental Status: She is alert.     Coordination: Coordination normal.     Deep Tendon Reflexes: Reflexes are normal and symmetric. Reflexes normal.  Psychiatric:        Mood and Affect: Mood normal.           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) - Primary    Per  pt stable on lialda  Sees Dr Leone Payor Having more issues with upper abd bloating/swelling        Other   Swelling abdomen    Upper abdominal swelling  Reviewed GI notes / studies and labs today History of some ascites on [place] and CT (but normal appearing liver and normal elasto graph this spring)  Sees Dr Leone Payor Was put on spironolactone and it is not helping  Pt thinks her abdomen is getting bigger and more heavy  No history of gallstones or pancreas abnormality  No history of renal dysfunction -normal bmet recently   Will recommend follow up with GI re: abdominal swelling and discomfort         Left leg swelling    Focal area of swelling/erythema/ heat and pain  May be superficial thrombophlebitis but need to r/o DVT LE doppler ordered Discussed use of compression Elevate Warm compress as needed   ER precautions noted  See AVS      Relevant Orders   VAS [place] LOWER EXTREMITY VENOUS (DVT)

## 2022-07-23 NOTE — Assessment & Plan Note (Addendum)
Upper abdominal swelling  Reviewed GI notes / studies and labs today History of some ascites on [place] and CT (but normal appearing liver and normal elasto graph this spring)  Sees Dr Leone Payor Was put on spironolactone and it is not helping  Pt thinks her abdomen is getting bigger and more heavy  No history of gallstones or pancreas abnormality  No history of renal dysfunction -normal bmet recently   Will recommend follow up with GI re: abdominal swelling and discomfort

## 2022-07-23 NOTE — Assessment & Plan Note (Signed)
Per pt prevacid works well   Feels like upper abdomen is swollen

## 2022-07-23 NOTE — Assessment & Plan Note (Signed)
Pt has a more focal area of inflamation /erythema and tenderness below/med left knee today  Wears hose prn (thigh high)

## 2022-07-24 ENCOUNTER — Ambulatory Visit (HOSPITAL_COMMUNITY)
Admission: RE | Admit: 2022-07-24 | Discharge: 2022-07-24 | Disposition: A | Discharge status: Home / Self Care | Payer: Medicare Other | Source: Ambulatory Visit | Attending: Family Medicine | Admitting: Family Medicine

## 2022-07-24 ENCOUNTER — Telehealth: Payer: Self-pay

## 2022-07-24 DIAGNOSIS — M7989 Other specified soft tissue disorders: Secondary | ICD-10-CM | POA: Diagnosis not present

## 2022-07-24 MED ORDER — APIXABAN (ELIQUIS) VTE STARTER PACK (10MG AND 5MG): ORAL_TABLET | ORAL | 0 refills | Status: DC

## 2022-07-24 NOTE — Telephone Encounter (Signed)
Called report from [place].  Thrombotic varicose vein and gastrocnemius.  Per Dr. Milinda Antis pt can be released.  Will call pt with results once they are available.

## 2022-07-24 NOTE — Telephone Encounter (Signed)
Pt advised of medication recommendations. She has scheduled appointment for 08/01/22.

## 2022-07-24 NOTE — Telephone Encounter (Signed)
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

## 2022-07-24 NOTE — Progress Notes (Signed)
Left lower extremity venous duplex has been completed. Preliminary results can be found in CV Proc through chart review.  Results were given to [facility] at Dr. Royden Purl office.  07/24/22 9:53 AM Olen Cordial RVT

## 2022-07-26 ENCOUNTER — Telehealth: Payer: Self-pay | Admitting: *Deleted

## 2022-07-26 DIAGNOSIS — R1900 Intra-abdominal and pelvic swelling, mass and lump, unspecified site: Secondary | ICD-10-CM

## 2022-07-26 DIAGNOSIS — R188 Other ascites: Secondary | ICD-10-CM

## 2022-07-26 NOTE — Telephone Encounter (Signed)
Pt notified of Dr. Royden Purl comments. She does have a f/u with PCP on 07/31/22 but she also agrees with referral to go back to GI, please put referral

## 2022-07-26 NOTE — Telephone Encounter (Signed)
-----   Message from Judy Pimple, MD sent at 07/23/2022  6:40 PM EDT ----- Please let pt know I reviewed GI notes and studies and I'm not sure if the ascites is the reason for her abdominal swelling or not  I would ultimately like her to follow up there for another visit so they can examine her - is it ok if I do a referral to set that up?

## 2022-07-27 DIAGNOSIS — R188 Other ascites: Secondary | ICD-10-CM | POA: Insufficient documentation

## 2022-07-27 NOTE — Telephone Encounter (Signed)
The referral is in.

## 2022-08-01 ENCOUNTER — Other Ambulatory Visit (INDEPENDENT_AMBULATORY_CARE_PROVIDER_SITE_OTHER): Payer: Medicare Other

## 2022-08-01 ENCOUNTER — Ambulatory Visit (INDEPENDENT_AMBULATORY_CARE_PROVIDER_SITE_OTHER): Payer: Medicare Other | Admitting: Family Medicine

## 2022-08-01 ENCOUNTER — Encounter: Payer: Self-pay | Admitting: Family Medicine

## 2022-08-01 VITALS — BP 118/74 | HR 79 | Temp 97.9°F | Ht 61.5 in | Wt 183.0 lb

## 2022-08-01 DIAGNOSIS — R188 Other ascites: Secondary | ICD-10-CM | POA: Diagnosis not present

## 2022-08-01 DIAGNOSIS — I824Z9 Acute embolism and thrombosis of unspecified deep veins of unspecified distal lower extremity: Secondary | ICD-10-CM | POA: Insufficient documentation

## 2022-08-01 DIAGNOSIS — R1900 Intra-abdominal and pelvic swelling, mass and lump, unspecified site: Secondary | ICD-10-CM

## 2022-08-01 DIAGNOSIS — Z86718 Personal history of other venous thrombosis and embolism: Secondary | ICD-10-CM | POA: Insufficient documentation

## 2022-08-01 DIAGNOSIS — I824Z2 Acute embolism and thrombosis of unspecified deep veins of left distal lower extremity: Secondary | ICD-10-CM

## 2022-08-01 LAB — BASIC METABOLIC PANEL
BUN: 16 mg/dL (ref 6–23)
CO2: 25 mEq/L (ref 19–32)
Calcium: 10.2 mg/dL (ref 8.4–10.5)
Chloride: 102 mEq/L (ref 96–112)
Creatinine, Ser: 0.7 mg/dL (ref 0.40–1.20)
GFR: 85.08 mL/min (ref >60.00)
Glucose, Bld: 99 mg/dL (ref 70–99)
Potassium: 4.1 mEq/L (ref 3.5–5.1)
Sodium: 135 mEq/L (ref 135–145)

## 2022-08-01 NOTE — Patient Instructions (Addendum)
Call the GI office to make a follow up appt to let them know that the abdomen swelling is not improving with spironolactone   Continue the eliquis as directed for 3 months After this is done I want to re image the leg and do some clotting labs   Elevate your leg when you can  Warm compresses are good   If any symptoms worsen please alert [place] (if severe go to the ER)

## 2022-08-01 NOTE — Progress Notes (Unsigned)
Subjective:    Patient ID: Rachel Carlson, female    DOB: [birth year], 77 y.o.   MRN: [id number]  HPI  Wt Readings from Last 3 Encounters:  08/01/22 183 lb (83 kg)  07/23/22 185 lb 6 oz (84.1 kg)  05/15/22 194 lb 12.8 oz (88.4 kg)   34.02 kg/m  Vitals:   08/01/22 0931  BP: 118/74  Pulse: 79  Temp: 97.9 F (36.6 C)  SpO2: 96%   Pt presents for re check of leg / clot in calf Also for GI issues   Last visit presented for swelling/redness of leg   We did [place]  Summary:  RIGHT:  - No evidence of common femoral vein obstruction.    LEFT:  - Findings consistent with acute deep vein thrombosis involving the left  gastrocnemius veins.  - No cystic structure found in the popliteal fossa.  - Multiple thrombosed varicose veins are noted in the calf.   We started a 3 month course of eliquis Started on 07/26/22     Pt states she is doing ok  Leg is not as tender but still notices some sensitivity radiating up into the leg just above the knee  Pain does radiate up   Being careful with blood thinner    Ascites- bumped up her spironolactone  She gets labs later today   She is still uncomfortable    Comes in for labs pre annual   Patient Active Problem List   Diagnosis Date Noted   DVT, lower extremity, distal (HCC) 08/01/2022   Ascites 07/27/2022   Left leg swelling 07/23/2022   Swelling abdomen 07/23/2022   Vertigo, benign positional 01/03/2022   History of vaginal bleeding 08/17/2021   Visit for routine gyn exam 08/17/2021   Intertrigo 08/17/2021   Nonrheumatic mitral valve regurgitation 04/16/2019   Essential hypertension 04/11/2018   PVC's (premature ventricular contractions) 04/11/2018   Class 2 severe obesity with serious comorbidity and body mass index (BMI) of 36.0 to 36.9 in adult [facility] [place]) 12/06/2017   Genetic testing 09/26/2017   Family history of breast cancer    Family history of colon cancer    Family history of prostate cancer    Family  history of melanoma    Family history of Lynch syndrome - son 08/04/2017   Prediabetes 07/20/2015   Estrogen deficiency 04/27/2015   GERD (gastroesophageal reflux disease) 03/23/2015   Hx of adenomatous polyp of colon 10/19/2014   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   Pain in joint, pelvic region and thigh 07/24/2014   Need for hepatitis C screening test 07/12/2014   Pelvic pain in female 07/12/2014   Vitamin D deficiency 07/03/2014   Encounter for Medicare annual wellness exam 07/08/2013   Osteoporosis 05/24/2013   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   Detrusor muscle hypertonia 11/15/2010   Adnexal pain 11/15/2010   Urge incontinence 11/15/2010   Family history of coronary artery disease 10/15/2010   Routine general medical examination at a health care facility 06/01/2010   FASCIITIS, PLANTAR 06/06/2009   Hyperlipidemia 06/03/2007   Urge urinary incontinence 06/03/2007   MIGRAINES, HX OF 06/03/2007   Past Medical History:  Diagnosis Date   Blood transfusion without reported diagnosis    Chronic right ear pain    normal MRI   Esophageal stricture    Family history of breast cancer    Family history of colon cancer    Family history of melanoma    Family history of prostate  cancer    Fibroids    uterine, history of   GERD (gastroesophageal reflux disease)    History of uterine prolapse    Hx of adenomatous polyp of colon 10/19/2014   Hyperlipidemia    Obesity    PVC (premature ventricular contraction)    first dx by holter in 1980's, echo (6/10) EF 55-60%, normal diastolic fxn, normal size RV and fxn, mild MR, PASP   Squamous cell carcinoma of arm    Tubal pregnancy    Ulcerative proctosigmoiditis (HCC)    Urge urinary incontinence 06/03/2007   Qualifier: Diagnosis of  By: Lindwood Qua CMA, Jerl Santos     Past Surgical History:  Procedure Laterality Date   BLADDER NECK SUSPENSION  1998   BREAST BIOPSY Left     CATARACT EXTRACTION Right 01/2020   CATARACT EXTRACTION W/PHACO Left 08/12/2020   Procedure: CATARACT EXTRACTION PHACO AND INTRAOCULAR LENS PLACEMENT LEFT EYE;  Surgeon: Fabio Pierce, MD;  Location: [facility];  Service: Ophthalmology;  Laterality: Left;  left CDE=10.71   COLONOSCOPY  05/18/2011/10/12/14   Dr. Stan Head   ECTOPIC PREGNANCY SURGERY  1980   ESOPHAGOGASTRODUODENOSCOPY (EGD) WITH ESOPHAGEAL DILATION  04/03/2012   Dr. Stan Head   PARTIAL HYSTERECTOMY     abdominal, fibroids, and prolapse (1998) bladder tack   SKIN CANCER EXCISION     UPPER GASTROINTESTINAL ENDOSCOPY     Social History   Tobacco Use   Smoking status: Never   Smokeless tobacco: Never  Vaping Use   Vaping Use: Never used  Substance Use Topics   Alcohol use: No    Alcohol/week: 0.0 standard drinks of alcohol    Comment: rarely beer   Drug use: No   Family History  Problem Relation Age of Onset   Sudden death Father 41       "coronary arteriosclerosis" on death certificate   Heart attack Mother 40   Transient ischemic attack Mother    Diabetes type II Mother    Sudden death Mother        died age 59   Diabetes Mother    Coronary artery disease Mother    Skin cancer Mother    Barrett's esophagus Mother    Arrhythmia Brother    Colon cancer Brother 50   Breast cancer Sister        dx in her 28s   Colon polyps Sister        adenomatous   Colon cancer Son 37       lynch syndrome   Melanoma Maternal Uncle    Congestive Heart Failure Maternal Grandmother    Prostate cancer Maternal Grandfather        dx in 36s   Tuberculosis Paternal Grandfather    Skin cancer Maternal Aunt    Barrett's esophagus Maternal Aunt    Other Son        parotid gland tumor   Ehlers-Danlos syndrome Daughter    Colon cancer Cousin        mid 59s; maternal cousin   Colon cancer Other        MGMs brother   Esophageal cancer Neg Hx    Stomach cancer Neg Hx    Rectal cancer Neg Hx    Allergies  Allergen  Reactions   Pantoprazole Sodium Diarrhea    Abdominal pain   Ranitidine Diarrhea    abdominal pain   Omeprazole Diarrhea    Abdominal pain   Sulfasalazine Rash    Fever, chills, headache, muscle  pain   Current Outpatient Medications on File Prior to Visit  Medication Sig Dispense Refill   APIXABAN (ELIQUIS) VTE STARTER PACK (10MG  AND 5MG ) Take as directed on package: start with two-5mg  tablets twice daily for 7 days. On day 8, switch to one-5mg  tablet twice daily. 74 each 0   Ascorbic Acid (VITAMIN C) 1000 MG tablet Take 1,000 mg by mouth daily.     aspirin 81 MG tablet Take 81 mg by mouth daily.       calcium citrate-vitamin D (CITRACAL+D) 315-200 MG-UNIT per tablet Take 1 tablet by mouth 2 (two) times daily.     Cholecalciferol (VITAMIN D-3) 5000 UNITS TABS Take 5,000 Units by mouth daily.     Coenzyme Q10 (CO Q-10) 200 MG CAPS Take 200 mg by mouth daily.     CRANBERRY PO Take 650 mg by mouth daily.     estradiol (ESTRACE) 0.1 MG/GM vaginal cream Place 0.5 g vaginally 2 (two) times a week. 42.5 g 11   Glucosamine-Chondroitin (GLUCOSAMINE CHONDR COMPLEX PO) Take 2 capsules by mouth daily.      GRAPE SEED EXTRACT PO Take 1 capsule by mouth daily.     hydrocortisone (ANUSOL-HC) 2.5 % rectal cream Place 1 Application rectally 2 (two) times daily. 30 g 1   hydrocortisone (CORTENEMA) 100 MG/60ML enema Use 60ml every night for 28 days 1680 mL 0   ketoconazole (NIZORAL) 2 % cream Apply 1 Application topically daily. To yeast rash 30 g 1   lansoprazole (PREVACID) 15 MG capsule Take 1 capsule (15 mg total) by mouth 2 (two) times daily before a meal. (Patient taking differently: Take 15 mg by mouth daily. Uses over the counter brand)     LIALDA 1.2 g EC tablet Take 2 tablets by mouth twice daily 120 tablet 2   lidocaine-prilocaine (EMLA) cream Use on vulva/ urethra as needed for pain. 30 g 1   meclizine (ANTIVERT) 25 MG tablet Take 1 tablet (25 mg total) by mouth 3 (three) times daily as needed  for dizziness or nausea (vertigo). 20 tablet 0   milk thistle 175 MG tablet Take 175 mg by mouth daily.     Omega-3 Fatty Acids (FISH OIL TRIPLE STRENGTH PO) Take 1 capsule by mouth daily.     Polyethyl Glycol-Propyl Glycol (SYSTANE OP) Place 1 drop into both eyes daily.     rosuvastatin (CRESTOR) 20 MG tablet Take 1 tablet (20 mg total) by mouth at bedtime. 90 tablet 3   spironolactone (ALDACTONE) 50 MG tablet Take 1 tablet (50 mg total) by mouth daily. 30 tablet 3   vitamin B-12 (CYANOCOBALAMIN) 1000 MCG tablet Take 1,000 mcg by mouth daily.     zinc gluconate 50 MG tablet Take 50 mg by mouth daily.     No current facility-administered medications on file prior to visit.    Review of Systems  Constitutional:  Negative for activity change, appetite change, fatigue, fever and unexpected weight change.  HENT:  Negative for congestion, ear pain, rhinorrhea, sinus pressure and sore throat.   Eyes:  Negative for pain, redness and visual disturbance.  Respiratory:  Negative for cough, shortness of breath and wheezing.   Cardiovascular:  Negative for chest pain and palpitations.       Left leg soreness from DVT is improving  Gastrointestinal:  Positive for abdominal distention. Negative for abdominal pain, blood in stool, constipation and diarrhea.       Abdomen still feels tight and bloated   Endocrine: Negative for  polydipsia and polyuria.  Genitourinary:  Negative for dysuria, frequency and urgency.  Musculoskeletal:  Negative for arthralgias, back pain and myalgias.  Skin:  Negative for pallor and rash.  Allergic/Immunologic: Negative for environmental allergies.  Neurological:  Negative for dizziness, syncope and headaches.  Hematological:  Negative for adenopathy. Does not bruise/bleed easily.  Psychiatric/Behavioral:  Negative for decreased concentration and dysphoric mood. The patient is not nervous/anxious.        Objective:   Physical Exam Constitutional:      General: She is  not in acute distress.    Appearance: She is well-developed. She is obese. She is not ill-appearing or diaphoretic.  HENT:     Head: Normocephalic and atraumatic.  Eyes:     Conjunctiva/sclera: Conjunctivae normal.     Pupils: Pupils are equal, round, and reactive to light.  Neck:     Thyroid: No thyromegaly.     Vascular: No carotid bruit or JVD.  Cardiovascular:     Rate and Rhythm: Normal rate and regular rhythm.     Heart sounds: Normal heart sounds.     No gallop.     Comments: Bilateral LE varicosities  Area of redness in LLE below knee is smaller and less swelling Palpable cord is more palpable in that area now Minimally tender  Pulmonary:     Effort: Pulmonary effort is normal. No respiratory distress.     Breath sounds: Normal breath sounds. No wheezing or rales.  Abdominal:     General: Abdomen is protuberant. There is no distension or abdominal bruit.     Palpations: Abdomen is soft. There is no fluid wave.  Musculoskeletal:     Cervical back: Normal range of motion and neck supple.     Right lower leg: No edema.     Left lower leg: No edema.  Lymphadenopathy:     Cervical: No cervical adenopathy.  Skin:    General: Skin is warm and dry.     Coloration: Skin is not jaundiced or pale.     Findings: No bruising or rash.  Neurological:     Mental Status: She is alert.     Motor: No weakness.     Coordination: Coordination normal.     Deep Tendon Reflexes: Reflexes are normal and symmetric. Reflexes normal.  Psychiatric:        Mood and Affect: Mood normal.           Assessment & Plan:   Problem List Items Addressed This Visit       Cardiovascular and Mediastinum   DVT, lower extremity, distal (HCC) - Primary    In gastroc vein (below knee) Pt has some discomfort radiating up leg at times but overall improving with eliquis  Some superficial thrombosed varicosities also  Encouraged to elevate and use warm compresses  Will plan on eliquis for 3 months   Then re image and hypercoag work up as this was not provoked          Other   Swelling abdomen    Pt has some ascites- she is bothered by it  Seen by GI Taking spironolactone but does not seem to improve her symptoms  Reviewed GI notes and studies  Weight is down 11 lb since April  She plans to follow up with them  Unsure if this is causing the discomfort / feeling of abd distension or not  Unsure of cause of the ascites / liver tests are normal

## 2022-08-02 NOTE — Assessment & Plan Note (Signed)
Pt has some ascites- she is bothered by it  Seen by GI Taking spironolactone but does not seem to improve her symptoms  Reviewed GI notes and studies  Weight is down 11 lb since April  She plans to follow up with them  Unsure if this is causing the discomfort / feeling of abd distension or not  Unsure of cause of the ascites / liver tests are normal

## 2022-08-02 NOTE — Assessment & Plan Note (Signed)
In gastroc vein (below knee) Pt has some discomfort radiating up leg at times but overall improving with eliquis  Some superficial thrombosed varicosities also  Encouraged to elevate and use warm compresses  Will plan on eliquis for 3 months  Then re image and hypercoag work up as this was not provoked

## 2022-08-06 ENCOUNTER — Telehealth: Payer: Self-pay | Admitting: Internal Medicine

## 2022-08-06 NOTE — Telephone Encounter (Signed)
Pt was made aware of recent results.  Symptom update was received. Pt stated that she has not noticed any difference in the swelling, nor noticed any difference in increase in  urinating. Pt stated that she does not like the way the spironolactone makes her feel and she feels nauseated while taking the medication : Pt stated that she stopped taking the medication last Wednesday. Pt was notified that she should never stop taking a medication unless a  provider gives her orders.  Please advise

## 2022-08-06 NOTE — Telephone Encounter (Signed)
Please let her know electrolytes and kidney function are normal  Please get an update on her swelling situation, is it better now on the higher dose of spironolactone?

## 2022-08-06 NOTE — Telephone Encounter (Signed)
Now that she has stopped it I want her to monitor her weight - want to see if she is gaining as that could represent fluid retention  She should weigh at least several times a week and after 3-4 weeks update me with weights and call back sooner prn concerns

## 2022-08-07 NOTE — Telephone Encounter (Signed)
Pt was notified of Dr. Leone Payor recommendations:  Pt stated that she still feels distended and uncomfortable.  Pt was scheduled for an office visit for evaluation with Boone Master PA on 08/13/2022 at 10:30 AM. Pt made aware. Pt verbalized understanding with all questions answered.

## 2022-08-09 ENCOUNTER — Ambulatory Visit
Admission: RE | Admit: 2022-08-09 | Discharge: 2022-08-09 | Disposition: A | Discharge status: Home / Self Care | Payer: Medicare Other | Source: Ambulatory Visit | Attending: Family Medicine | Admitting: Family Medicine

## 2022-08-09 DIAGNOSIS — Z1231 Encounter for screening mammogram for malignant neoplasm of breast: Secondary | ICD-10-CM | POA: Diagnosis not present

## 2022-08-12 ENCOUNTER — Telehealth: Payer: Self-pay | Admitting: Family Medicine

## 2022-08-12 DIAGNOSIS — E559 Vitamin D deficiency, unspecified: Secondary | ICD-10-CM

## 2022-08-12 DIAGNOSIS — E7800 Pure hypercholesterolemia, unspecified: Secondary | ICD-10-CM

## 2022-08-12 DIAGNOSIS — I10 Essential (primary) hypertension: Secondary | ICD-10-CM

## 2022-08-12 DIAGNOSIS — E2839 Other primary ovarian failure: Secondary | ICD-10-CM

## 2022-08-12 DIAGNOSIS — R7303 Prediabetes: Secondary | ICD-10-CM

## 2022-08-12 NOTE — Telephone Encounter (Signed)
-----   Message from Lovena Neighbours sent at 07/30/2022  1:56 PM EDT ----- Regarding: Labs for 7.15.24 Patient is scheduled for CPX labs, please order future labs. Thank you, Denny Peon

## 2022-08-13 ENCOUNTER — Ambulatory Visit: Payer: Medicare Other | Admitting: Gastroenterology

## 2022-08-13 ENCOUNTER — Other Ambulatory Visit: Payer: Medicare Other

## 2022-08-13 ENCOUNTER — Encounter: Payer: Self-pay | Admitting: Gastroenterology

## 2022-08-13 VITALS — BP 122/78 | HR 90 | Ht 61.5 in | Wt 188.0 lb

## 2022-08-13 DIAGNOSIS — I10 Essential (primary) hypertension: Secondary | ICD-10-CM | POA: Diagnosis not present

## 2022-08-13 DIAGNOSIS — E7800 Pure hypercholesterolemia, unspecified: Secondary | ICD-10-CM | POA: Diagnosis not present

## 2022-08-13 DIAGNOSIS — E559 Vitamin D deficiency, unspecified: Secondary | ICD-10-CM | POA: Diagnosis not present

## 2022-08-13 DIAGNOSIS — K760 Fatty (change of) liver, not elsewhere classified: Secondary | ICD-10-CM | POA: Diagnosis not present

## 2022-08-13 DIAGNOSIS — R7303 Prediabetes: Secondary | ICD-10-CM

## 2022-08-13 DIAGNOSIS — R140 Abdominal distension (gaseous): Secondary | ICD-10-CM | POA: Diagnosis not present

## 2022-08-13 LAB — COMPREHENSIVE METABOLIC PANEL
ALT: 8 U/L (ref 0–35)
AST: 16 U/L (ref 0–37)
Albumin: 3.9 g/dL (ref 3.5–5.2)
Alkaline Phosphatase: 63 U/L (ref 39–117)
BUN: 10 mg/dL (ref 6–23)
CO2: 25 mEq/L (ref 19–32)
Calcium: 9.9 mg/dL (ref 8.4–10.5)
Chloride: 104 mEq/L (ref 96–112)
Creatinine, Ser: 0.68 mg/dL (ref 0.40–1.20)
GFR: 85.66 mL/min (ref >60.00)
Glucose, Bld: 98 mg/dL (ref 70–99)
Potassium: 4.5 mEq/L (ref 3.5–5.1)
Sodium: 138 mEq/L (ref 135–145)
Total Bilirubin: 0.8 mg/dL (ref 0.2–1.2)
Total Protein: 6.5 g/dL (ref 6.0–8.3)

## 2022-08-13 LAB — CBC WITH DIFFERENTIAL/PLATELET
Basophils Absolute: 0 10*3/uL (ref 0.0–0.1)
Basophils Relative: 0.7 % (ref 0.0–3.0)
Eosinophils Absolute: 0.1 10*3/uL (ref 0.0–0.7)
Eosinophils Relative: 1.2 % (ref 0.0–5.0)
HCT: 39.3 % (ref 36.0–46.0)
Hemoglobin: 12.7 g/dL (ref 12.0–15.0)
Lymphocytes Relative: 16.2 % (ref 12.0–46.0)
Lymphs Abs: 1 10*3/uL (ref 0.7–4.0)
MCHC: 32.3 g/dL (ref 30.0–36.0)
MCV: 85.2 fl (ref 78.0–100.0)
Monocytes Absolute: 0.5 10*3/uL (ref 0.1–1.0)
Monocytes Relative: 8.5 % (ref 3.0–12.0)
Neutro Abs: 4.4 10*3/uL (ref 1.4–7.7)
Neutrophils Relative %: 73.4 % (ref 43.0–77.0)
Platelets: 311 10*3/uL (ref 150.0–400.0)
RBC: 4.61 Mil/uL (ref 3.87–5.11)
RDW: 13.9 % (ref 11.5–15.5)
WBC: 6 10*3/uL (ref 4.0–10.5)

## 2022-08-13 LAB — LIPID PANEL
Cholesterol: 152 mg/dL (ref 0–200)
HDL: 46.9 mg/dL (ref >39.00)
LDL Cholesterol: 78 mg/dL (ref 0–99)
NonHDL: 104.99
Total CHOL/HDL Ratio: 3
Triglycerides: 134 mg/dL (ref 0.0–149.0)
VLDL: 26.8 mg/dL (ref 0.0–40.0)

## 2022-08-13 LAB — VITAMIN D 25 HYDROXY (VIT D DEFICIENCY, FRACTURES): VITD: 24.95 ng/mL — ABNORMAL LOW (ref 30.00–100.00)

## 2022-08-13 LAB — TSH: TSH: 3.86 u[IU]/mL (ref 0.35–5.50)

## 2022-08-13 LAB — HEMOGLOBIN A1C: Hgb A1c MFr Bld: 6.3 % (ref 4.6–6.5)

## 2022-08-13 NOTE — Progress Notes (Signed)
Chief Complaint: Abdominal distention Primary GI MD: Dr. Leone Payor  HPI: 77 year old female with chronic ulcerative proctosigmoiditis, pulmonary fibrosis, presents for evaluation of abdominal distention.  Last seen 04/2022 by Dr. Leone Payor. Seen for nodular contour to liver and trace abdominal ascites noted on abdominal ultrasound.  Patient was concerned for cirrhosis as she is primary caretaker to her husband with ALS.  Patient underwent elastography which did not show cirrhosis. Patient was put on spironolactone 25 Mg once daily for bloating/distention in June.  Patient then had normal basic metabolic panel.  And spironolactone was increased to 50 Mg daily.  Follow-up BMP in July showed normal electrolytes and kidney function.  Patient reported she did not notice any difference in swelling or urinating on higher dose of spironolactone.  She did stop taking the medication at the beginning of June due to nausea.  Patient states she has persistent abdominal distention. She has been recording her weight since end of may. Appears morning to evening weight changes anywhere from 1-3lbs (average appears 1-2lb change and sometimes there is no change in weight). Distention is unchanged through day and she wakes up with distention. Not associated with eating. No pedal edema. Feels spironolactone does not help. No pain. Some mild discomfort.  PREVIOUS GI WORKUP   Colonoscopy 10/12/2014 for diarrhea: Proctosigmoiditis confluent ulceration and mild friability to 25 cm from anal verge.  Diminutive polyp.  Diverticulosis in sigmoid colon.  Patient was put on Lialda 4.8 g daily and Cortenema  EGD 03/31/2013 for dysphagia: Stricture at GE junction-dilated to 20 mm.  5 cm hiatal hernia  Past Medical History:  Diagnosis Date   Blood transfusion without reported diagnosis    Chronic right ear pain    normal MRI   Esophageal stricture    Family history of breast cancer    Family history of colon cancer    Family  history of melanoma    Family history of prostate cancer    Fibroids    uterine, history of   GERD (gastroesophageal reflux disease)    History of uterine prolapse    Hx of adenomatous polyp of colon 10/19/2014   Hyperlipidemia    Obesity    PVC (premature ventricular contraction)    first dx by holter in 1980's, echo (6/10) EF 55-60%, normal diastolic fxn, normal size RV and fxn, mild MR, PASP   Squamous cell carcinoma of arm    Tubal pregnancy    Ulcerative proctosigmoiditis (HCC)    Urge urinary incontinence 06/03/2007   Qualifier: Diagnosis of  By: Lindwood Qua CMA, Jerl Santos      Past Surgical History:  Procedure Laterality Date   BLADDER NECK SUSPENSION  1998   BREAST BIOPSY Left    CATARACT EXTRACTION Right 01/2020   CATARACT EXTRACTION W/PHACO Left 08/12/2020   Procedure: CATARACT EXTRACTION PHACO AND INTRAOCULAR LENS PLACEMENT LEFT EYE;  Surgeon: Fabio Pierce, MD;  Location: [facility];  Service: Ophthalmology;  Laterality: Left;  left CDE=10.71   COLONOSCOPY  05/18/2011/10/12/14   Dr. Stan Head   ECTOPIC PREGNANCY SURGERY  1980   ESOPHAGOGASTRODUODENOSCOPY (EGD) WITH ESOPHAGEAL DILATION  04/03/2012   Dr. Stan Head   PARTIAL HYSTERECTOMY     abdominal, fibroids, and prolapse (1998) bladder tack   SKIN CANCER EXCISION     UPPER GASTROINTESTINAL ENDOSCOPY      Current Outpatient Medications  Medication Sig Dispense Refill   APIXABAN (ELIQUIS) VTE STARTER PACK (10MG  AND 5MG ) Take as directed on package: start with two-5mg  tablets twice  daily for 7 days. On day 8, switch to one-5mg  tablet twice daily. 74 each 0   Ascorbic Acid (VITAMIN C) 1000 MG tablet Take 1,000 mg by mouth daily.     aspirin 81 MG tablet Take 81 mg by mouth daily.       calcium citrate-vitamin D (CITRACAL+D) 315-200 MG-UNIT per tablet Take 1 tablet by mouth 2 (two) times daily.     Cholecalciferol (VITAMIN D-3) 5000 UNITS TABS Take 5,000 Units by mouth daily.     Coenzyme Q10 (CO Q-10) 200  MG CAPS Take 200 mg by mouth daily.     CRANBERRY PO Take 650 mg by mouth daily.     estradiol (ESTRACE) 0.1 MG/GM vaginal cream Place 0.5 g vaginally 2 (two) times a week. 42.5 g 11   Glucosamine-Chondroitin (GLUCOSAMINE CHONDR COMPLEX PO) Take 2 capsules by mouth daily.      GRAPE SEED EXTRACT PO Take 1 capsule by mouth daily.     hydrocortisone (ANUSOL-HC) 2.5 % rectal cream Place 1 Application rectally 2 (two) times daily. 30 g 1   hydrocortisone (CORTENEMA) 100 MG/60ML enema Use 60ml every night for 28 days 1680 mL 0   ketoconazole (NIZORAL) 2 % cream Apply 1 Application topically daily. To yeast rash 30 g 1   lansoprazole (PREVACID) 15 MG capsule Take 1 capsule (15 mg total) by mouth 2 (two) times daily before a meal. (Patient taking differently: Take 15 mg by mouth daily. Uses over the counter brand)     LIALDA 1.2 g EC tablet Take 2 tablets by mouth twice daily 120 tablet 2   lidocaine-prilocaine (EMLA) cream Use on vulva/ urethra as needed for pain. 30 g 1   meclizine (ANTIVERT) 25 MG tablet Take 1 tablet (25 mg total) by mouth 3 (three) times daily as needed for dizziness or nausea (vertigo). 20 tablet 0   milk thistle 175 MG tablet Take 175 mg by mouth daily.     Omega-3 Fatty Acids (FISH OIL TRIPLE STRENGTH PO) Take 1 capsule by mouth daily.     Polyethyl Glycol-Propyl Glycol (SYSTANE OP) Place 1 drop into both eyes daily.     rosuvastatin (CRESTOR) 20 MG tablet Take 1 tablet (20 mg total) by mouth at bedtime. 90 tablet 3   spironolactone (ALDACTONE) 50 MG tablet Take 1 tablet (50 mg total) by mouth daily. 30 tablet 3   vitamin B-12 (CYANOCOBALAMIN) 1000 MCG tablet Take 1,000 mcg by mouth daily.     zinc gluconate 50 MG tablet Take 50 mg by mouth daily.     No current facility-administered medications for this visit.    Allergies as of 08/13/2022 - Review Complete 08/01/2022  Allergen Reaction Noted   Pantoprazole sodium Diarrhea 03/03/2013   Ranitidine Diarrhea 03/03/2013    Omeprazole Diarrhea 06/05/2012   Sulfasalazine Rash 11/08/2014    Family History  Problem Relation Age of Onset   Sudden death Father 88       "coronary arteriosclerosis" on death certificate   Heart attack Mother 7   Transient ischemic attack Mother    Diabetes type II Mother    Sudden death Mother        died age 69   Diabetes Mother    Coronary artery disease Mother    Skin cancer Mother    Barrett's esophagus Mother    Arrhythmia Brother    Colon cancer Brother 32   Breast cancer Sister        dx in her 47s  Colon polyps Sister        adenomatous   Colon cancer Son 46       lynch syndrome   Melanoma Maternal Uncle    Congestive Heart Failure Maternal Grandmother    Prostate cancer Maternal Grandfather        dx in 63s   Tuberculosis Paternal Grandfather    Skin cancer Maternal Aunt    Barrett's esophagus Maternal Aunt    Other Son        parotid gland tumor   Ehlers-Danlos syndrome Daughter    Colon cancer Cousin        mid 44s; maternal cousin   Colon cancer Other        MGMs brother   Esophageal cancer Neg Hx    Stomach cancer Neg Hx    Rectal cancer Neg Hx     Social History   Socioeconomic History   Marital status: Married    Spouse name: Not on file   Number of children: 3   Years of education: Not on file   Highest education level: Not on file  Occupational History   Occupation: International aid/development worker    Comment:      Employer: unemployed  Tobacco Use   Smoking status: Never   Smokeless tobacco: Never  Vaping Use   Vaping status: Never Used  Substance and Sexual Activity   Alcohol use: No    Alcohol/week: 0.0 standard drinks of alcohol    Comment: rarely beer   Drug use: No   Sexual activity: Not Currently  Other Topics Concern   Not on file  Social History Narrative   Retired International aid/development worker, 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)   Grown children   Never smoker no alcohol tobacco or drug use   Social Determinants of Health   Financial  Resource Strain: Low Risk  (08/25/2021)   Overall Financial Resource Strain (CARDIA)    Difficulty of Paying Living Expenses: Not hard at all  Food Insecurity: No Food Insecurity (08/25/2021)   Hunger Vital Sign    Worried About Running Out of Food in the Last Year: Never true    Ran Out of Food in the Last Year: Never true  Transportation Needs: No Transportation Needs (08/25/2021)   PRAPARE - Administrator, Civil Service (Medical): No    Lack of Transportation (Non-Medical): No  Physical Activity: Inactive (08/25/2021)   Exercise Vital Sign    Days of Exercise per Week: 0 days    Minutes of Exercise per Session: 0 min  Stress: No Stress Concern Present (08/25/2021)   [employer] of Occupational Health - Occupational Stress Questionnaire    Feeling of Stress : Not at all  Social Connections: Socially Integrated (08/25/2021)   Social Connection and Isolation Panel [NHANES]    Frequency of Communication with Friends and Family: More than three times a week    Frequency of Social Gatherings with Friends and Family: More than three times a week    Attends Religious Services: More than 4 times per year    Active Member of [employer] or Organizations: Yes    Attends Banker Meetings: More than 4 times per year    Marital Status: Married  Catering manager Violence: Not At Risk (08/25/2021)   Humiliation, Afraid, Rape, and Kick questionnaire    Fear of Current or Ex-Partner: No    Emotionally Abused: No    Physically Abused: No    Sexually Abused: No  Review of Systems:    Constitutional: No weight loss, fever, chills, weakness or fatigue HEENT: Eyes: No change in vision               Ears, Nose, Throat:  No change in hearing or congestion Skin: No rash or itching Cardiovascular: No chest pain, chest pressure or palpitations   Respiratory: No SOB or cough Gastrointestinal: See HPI and otherwise negative Genitourinary: No dysuria or change in urinary  frequency Neurological: No headache, dizziness or syncope Musculoskeletal: No new muscle or joint pain Hematologic: No bleeding or bruising Psychiatric: No history of depression or anxiety    Physical Exam:  Vital signs: LMP 05/29/1996   Constitutional: NAD, Well developed, Well nourished, alert and cooperative Head:  Normocephalic and atraumatic. Eyes:   PEERL, EOMI. No icterus. Conjunctiva pink. Respiratory: Respirations even and unlabored. Lungs clear to auscultation bilaterally.   No wheezes, crackles, or rhonchi.  Cardiovascular:  Regular rate and rhythm. No peripheral edema, cyanosis or pallor.  Gastrointestinal: Soft, moderately distended abdomen with dullness to percussion.  Normal bowel sounds.  Nontender.   Rectal:  Not performed.  Msk:  Symmetrical without gross deformities. Without edema, no deformity or joint abnormality.  Neurologic:  Alert and  oriented x4;  grossly normal neurologically.  Skin:   Dry and intact without significant lesions or rashes. Psychiatric: Oriented to person, place and time. Demonstrates good judgement and reason without abnormal affect or behaviors.   RELEVANT LABS AND IMAGING: CBC    Component Value Date/Time   WBC 6.1 05/15/2022 1125   RBC 4.51 05/15/2022 1125   HGB 13.3 05/15/2022 1125   HCT 39.9 05/15/2022 1125   PLT 246.0 05/15/2022 1125   MCV 88.3 05/15/2022 1125   MCHC 33.3 05/15/2022 1125   RDW 13.5 05/15/2022 1125   LYMPHSABS 1.0 05/15/2022 1125   MONOABS 0.5 05/15/2022 1125   EOSABS 0.0 05/15/2022 1125   BASOSABS 0.0 05/15/2022 1125    CMP     Component Value Date/Time   NA 135 08/01/2022 1058   K 4.1 08/01/2022 1058   CL 102 08/01/2022 1058   CO2 25 08/01/2022 1058   GLUCOSE 99 08/01/2022 1058   BUN 16 08/01/2022 1058   CREATININE 0.70 08/01/2022 1058   CALCIUM 10.2 08/01/2022 1058   PROT 7.1 05/15/2022 1125   ALBUMIN 4.4 05/15/2022 1125   AST 14 05/15/2022 1125   ALT 10 05/15/2022 1125   ALKPHOS 63 05/15/2022  1125   BILITOT 0.6 05/15/2022 1125   GFRNONAA 94.96 06/06/2009 1016   GFRAA 94 06/04/2007 1522     Assessment/Plan:   Abdominal distention Fatty liver Patient reports persistent abdominal distention without improvement despite spironolactone.  Review of weights show that her weight varies 1 to 3 pounds from morning to evening which is a normal variation in weight for most people.  Physical exam does show abdominal distention with some dullness to percussion.  No pedal edema.  Liver elastography negative for cirrhosis.  Recently got lab work drawn today by PCP including liver enzymes, awaiting results.  Patient denies constipation, I wonder if distention is gaseous distention.  Consider x-ray but patient would prefer CT scan for further evaluation  -CT abdomen pelvis with contrast for further evaluation -Pending CT may need to do trial of Lasix with very close monitoring of electrolytes and liver enzymes. -Continue to track weight -Follow up in 3 months with Dr. Leone Payor (patient requests to see Dr. Leone Payor)   Boone Master, PA-C [facility] 08/13/2022, 8:58  AM  Cc: Tower, Audrie Gallus, MD

## 2022-08-13 NOTE — Patient Instructions (Signed)
You will be contacted by [facility] Scheduling in the next 2 days to arrange a CT Abdomin/Pelvis.  The number on your caller ID will be [phone], please answer when they call.  If you have not heard from them in 2 days please call [phone] to schedule.     Due to recent changes in healthcare laws, you may see the results of your imaging and laboratory studies on MyChart before your provider has had a chance to review them.  We understand that in some cases there may be results that are confusing or concerning to you. Not all laboratory results come back in the same time frame and the provider may be waiting for multiple results in order to interpret others.  Please give [place] 48 hours in order for your provider to thoroughly review all the results before contacting the office for clarification of your results.    _______________________________________________________  If your blood pressure at your visit was 140/90 or greater, please contact your primary care physician to follow up on this.  _______________________________________________________  If you are age 75 or older, your body mass index should be between 23-30. Your Body mass index is 34.95 kg/m. If this is out of the aforementioned range listed, please consider follow up with your Primary Care Provider.  If you are age 41 or younger, your body mass index should be between 19-25. Your Body mass index is 34.95 kg/m. If this is out of the aformentioned range listed, please consider follow up with your Primary Care Provider.   ________________________________________________________  The [facility] providers would like to encourage you to use [facility] to communicate with providers for non-urgent requests or questions.  Due to long hold times on the telephone, sending your provider a message by [facility] may be a faster and more efficient way to get a response.  Please allow 48 business hours for a response.  Please remember that  this is for non-urgent requests.  _______________________________________________________   I appreciate the  opportunity to care for you  Thank You   Bayley McMichael PA-C

## 2022-08-20 ENCOUNTER — Encounter: Payer: Self-pay | Admitting: Family Medicine

## 2022-08-20 ENCOUNTER — Ambulatory Visit (INDEPENDENT_AMBULATORY_CARE_PROVIDER_SITE_OTHER): Payer: Medicare Other | Admitting: Family Medicine

## 2022-08-20 VITALS — BP 128/86 | HR 99 | Temp 98.0°F | Ht 62.0 in | Wt 191.0 lb

## 2022-08-20 DIAGNOSIS — M810 Age-related osteoporosis without current pathological fracture: Secondary | ICD-10-CM

## 2022-08-20 DIAGNOSIS — E559 Vitamin D deficiency, unspecified: Secondary | ICD-10-CM | POA: Diagnosis not present

## 2022-08-20 DIAGNOSIS — E7800 Pure hypercholesterolemia, unspecified: Secondary | ICD-10-CM | POA: Diagnosis not present

## 2022-08-20 DIAGNOSIS — R7303 Prediabetes: Secondary | ICD-10-CM | POA: Diagnosis not present

## 2022-08-20 DIAGNOSIS — Z6834 Body mass index (BMI) 34.0-34.9, adult: Secondary | ICD-10-CM

## 2022-08-20 DIAGNOSIS — R1900 Intra-abdominal and pelvic swelling, mass and lump, unspecified site: Secondary | ICD-10-CM

## 2022-08-20 DIAGNOSIS — Z0000 Encounter for general adult medical examination without abnormal findings: Secondary | ICD-10-CM | POA: Diagnosis not present

## 2022-08-20 DIAGNOSIS — I824Z2 Acute embolism and thrombosis of unspecified deep veins of left distal lower extremity: Secondary | ICD-10-CM

## 2022-08-20 DIAGNOSIS — K219 Gastro-esophageal reflux disease without esophagitis: Secondary | ICD-10-CM

## 2022-08-20 DIAGNOSIS — E6609 Other obesity due to excess calories: Secondary | ICD-10-CM

## 2022-08-20 DIAGNOSIS — Z8742 Personal history of other diseases of the female genital tract: Secondary | ICD-10-CM

## 2022-08-20 MED ORDER — APIXABAN 5 MG PO TABS
5.0000 mg | ORAL_TABLET | Freq: Two times a day (BID) | ORAL | 2 refills | Status: DC
Start: 2022-08-20 — End: 2022-12-26

## 2022-08-20 MED ORDER — ROSUVASTATIN CALCIUM 20 MG PO TABS: 20.0000 mg | ORAL_TABLET | Freq: Every day | ORAL | 3 refills | Status: DC

## 2022-08-20 NOTE — Assessment & Plan Note (Signed)
Discussed how this problem influences overall health and the risks it imposes  Reviewed plan for weight loss with lower calorie diet (via better food choices (lower glycemic and portion control) along with exercise building up to or more than 30 minutes 5 days per week including some aerobic activity and strength training

## 2022-08-20 NOTE — Progress Notes (Signed)
Subjective:    Patient ID: Rachel Carlson, female    DOB: 06-09-1945, 77 y.o.   MRN: [id number]  HPI  Here for health maintenance exam and to review chronic medical problems   Wt Readings from Last 3 Encounters:  08/20/22 191 lb (86.6 kg)  08/13/22 188 lb (85.3 kg)  08/01/22 183 lb (83 kg)   34.93 kg/m  Vitals:   08/20/22 1356  BP: 128/86  Pulse: 99  Temp: 98 F (36.7 C)  SpO2: 96%    Immunization History  Administered Date(s) Administered   Influenza Split 12/04/2010, 10/31/2015   Influenza Whole 12/29/2008, 11/22/2009   Influenza, High Dose Seasonal PF 11/20/2018, 01/04/2022   Influenza,inj,Quad PF,6+ Mos 12/06/2017   Influenza-Unspecified 11/29/2012, 01/04/2016, 10/30/2016   Moderna Sars-Covid-2 Vaccination 04/02/2019, 05/05/2019   Pneumococcal Conjugate-13 07/20/2015   Pneumococcal Polysaccharide-23 08/17/2021   Pneumococcal-Unspecified 01/30/2013   Respiratory Syncytial Virus Vaccine,Recomb Aduvanted(Arexvy) 01/04/2022   Td 12/29/2005   Tdap 10/31/2015   Zoster Recombinant(Shingrix) 01/11/2017, 04/11/2017   Zoster, Live 06/10/2009    Health Maintenance Due  Topic Date Due   Medicare Annual Wellness (AWV)  08/26/2022    Mammogram 07/2022  Self breast exam: no lumps   No gyn problems  Last year had varicosity on the vulva that bled    Colon cancer screening - colonoscopy 12/2019 -next due 2026 per pt     Dexa  10/2021  Osteopenia  Falls: none  Fractures:none  Supplements  Last vitamin D   Lab Results  Component Value Date   VD25OH 24.95 (L) 08/13/2022   Takes ca citrate  Exercise - housework / garden work     Mood    08/20/2022    2:02 PM 08/01/2022    9:39 AM 07/23/2022    2:26 PM 01/03/2022   11:41 AM 08/25/2021    3:05 PM  Depression screen PHQ 2/9  Decreased Interest 0 0 0 0 0  Down, Depressed, Hopeless 0 0 0 0 0  PHQ - 2 Score 0 0 0 0 0  Altered sleeping 0 0 0    Tired, decreased energy 0 1 0    Change in appetite 1 1 1      Feeling bad or failure about yourself  0 0 0    Trouble concentrating 0 0 0    Moving slowly or fidgety/restless 0 0 0    Suicidal thoughts 0 0 0    PHQ-9 Score 1 2 1     Difficult doing work/chores Not difficult at all Not difficult at all Not difficult at all      HTN bp is stable today  No cp or palpitations or headaches or edema  Controlled with lifestyle habits  BP Readings from Last 3 Encounters:  08/20/22 128/86  08/13/22 122/78  08/01/22 118/74    Pulse Readings from Last 3 Encounters:  08/20/22 99  08/13/22 90  08/01/22 79   Pulse is up and down  Has a pulse ox machine she plays with   Currently treatment for DVT of lower leg    Lab Results  Component Value Date   NA 138 08/13/2022   K 4.5 08/13/2022   CO2 25 08/13/2022   GLUCOSE 98 08/13/2022   BUN 10 08/13/2022   CREATININE 0.68 08/13/2022   CALCIUM 9.9 08/13/2022   GFR 85.66 08/13/2022   GFRNONAA 94.96 06/06/2009   Lab Results  Component Value Date   ALT 8 08/13/2022   AST 16 08/13/2022   ALKPHOS 63 08/13/2022  BILITOT 0.8 08/13/2022    Hyperlipidemia Lab Results  Component Value Date   CHOL 152 08/13/2022   CHOL 160 08/11/2021   CHOL 153 08/11/2020   Lab Results  Component Value Date   HDL 46.90 08/13/2022   HDL 58.60 08/11/2021   HDL 62.00 08/11/2020   Lab Results  Component Value Date   LDLCALC 78 08/13/2022   LDLCALC 74 08/11/2021   LDLCALC 66 08/11/2020   Lab Results  Component Value Date   TRIG 134.0 08/13/2022   TRIG 135.0 08/11/2021   TRIG 124.0 08/11/2020   Lab Results  Component Value Date   CHOLHDL 3 08/13/2022   CHOLHDL 3 08/11/2021   CHOLHDL 2 08/11/2020   Lab Results  Component Value Date   LDLDIRECT 257.3 06/04/2008   LDLDIRECT 203.9 06/04/2007   Crestor 20 mg daily  HDL is down a bit   Has had stress testing from cardiology Fam history CAD  The 10-year ASCVD risk score (Arnett DK, et al., 2019) is: 14%   Values used to calculate the score:      Age: 19 years     Sex: Female     Is Non-Hispanic African American: No     Diabetic: No     Tobacco smoker: No     Systolic Blood Pressure: 128 mmHg     Is BP treated: No     HDL Cholesterol: 46.9 mg/dL     Total Cholesterol: 152 mg/dL   Prediabetes Lab Results  Component Value Date   HGBA1C 6.3 08/13/2022  Stable   Drinks water  Occational dillutes a bit of gatorade   Some potatoes and sweet potatoes  Eats beans  Avoids bread usually   Occational small can buscuit   Occational cereal-not often  Likes great grains    Sees GI CT abd /pelvis ordered for c/o abd distension  Has that scheduled on Thursday Is getting harder to move around     Patient Active Problem List   Diagnosis Date Noted   DVT, lower extremity, distal (HCC) 08/01/2022   Ascites 07/27/2022   Left leg swelling 07/23/2022   Swelling abdomen 07/23/2022   History of vaginal bleeding 08/17/2021   Nonrheumatic mitral valve regurgitation 04/16/2019   Essential hypertension 04/11/2018   PVC's (premature ventricular contractions) 04/11/2018   Class 1 obesity due to excess calories with serious comorbidity and body mass index (BMI) of 34.0 to 34.9 in adult 12/06/2017   Genetic testing 09/26/2017   Family history of breast cancer    Family history of colon cancer    Family history of prostate cancer    Family history of melanoma    Family history of Lynch syndrome - son 08/04/2017   Prediabetes 07/20/2015   Estrogen deficiency 04/27/2015   GERD (gastroesophageal reflux disease) 03/23/2015   Hx of adenomatous polyp of colon 10/19/2014   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   Pain in joint, pelvic region and thigh 07/24/2014   Need for hepatitis C screening test 07/12/2014   Pelvic pain in female 07/12/2014   Vitamin D deficiency 07/03/2014   Encounter for Medicare annual wellness exam 07/08/2013   Osteoporosis 05/24/2013   Varicose veins of leg with pain  07/03/2011   Detrusor muscle hypertonia 11/15/2010   Adnexal pain 11/15/2010   Urge incontinence 11/15/2010   Family history of coronary artery disease 10/15/2010   Routine general medical examination at a health care facility 06/01/2010   FASCIITIS, PLANTAR 06/06/2009   Hyperlipidemia 06/03/2007  Urge urinary incontinence 06/03/2007   MIGRAINES, HX OF 06/03/2007   Past Medical History:  Diagnosis Date   Blood transfusion without reported diagnosis    Chronic right ear pain    normal MRI   Esophageal stricture    Family history of breast cancer    Family history of colon cancer    Family history of melanoma    Family history of prostate cancer    Fibroids    uterine, history of   GERD (gastroesophageal reflux disease)    History of uterine prolapse    Hx of adenomatous polyp of colon 10/19/2014   Hyperlipidemia    Obesity    PVC (premature ventricular contraction)    first dx by holter in 1980's, echo (6/10) EF 55-60%, normal diastolic fxn, normal size RV and fxn, mild MR, PASP   Squamous cell carcinoma of arm    Tubal pregnancy    Ulcerative proctosigmoiditis (HCC)    Urge urinary incontinence 06/03/2007   Qualifier: Diagnosis of  By: Lindwood Qua CMA, Jerl Santos     Past Surgical History:  Procedure Laterality Date   BLADDER NECK SUSPENSION  1998   BREAST BIOPSY Left    CATARACT EXTRACTION Right 01/2020   CATARACT EXTRACTION W/PHACO Left 08/12/2020   Procedure: CATARACT EXTRACTION PHACO AND INTRAOCULAR LENS PLACEMENT LEFT EYE;  Surgeon: Fabio Pierce, MD;  Location: [facility];  Service: Ophthalmology;  Laterality: Left;  left CDE=10.71   COLONOSCOPY  05/18/2011/10/12/14   Dr. Stan Head   ECTOPIC PREGNANCY SURGERY  1980   ESOPHAGOGASTRODUODENOSCOPY (EGD) WITH ESOPHAGEAL DILATION  04/03/2012   Dr. Stan Head   PARTIAL HYSTERECTOMY     abdominal, fibroids, and prolapse (1998) bladder tack   SKIN CANCER EXCISION     UPPER GASTROINTESTINAL ENDOSCOPY     Social  History   Tobacco Use   Smoking status: Never   Smokeless tobacco: Never  Vaping Use   Vaping status: Never Used  Substance Use Topics   Alcohol use: No    Alcohol/week: 0.0 standard drinks of alcohol    Comment: rarely beer   Drug use: No   Family History  Problem Relation Age of Onset   Sudden death Father 74       "coronary arteriosclerosis" on death certificate   Heart attack Mother 66   Transient ischemic attack Mother    Diabetes type II Mother    Sudden death Mother        died age 17   Diabetes Mother    Coronary artery disease Mother    Skin cancer Mother    Barrett's esophagus Mother    Arrhythmia Brother    Colon cancer Brother 43   Breast cancer Sister        dx in her 38s   Colon polyps Sister        adenomatous   Colon cancer Son 37       lynch syndrome   Melanoma Maternal Uncle    Congestive Heart Failure Maternal Grandmother    Prostate cancer Maternal Grandfather        dx in 51s   Tuberculosis Paternal Grandfather    Skin cancer Maternal Aunt    Barrett's esophagus Maternal Aunt    Other Son        parotid gland tumor   Ehlers-Danlos syndrome Daughter    Colon cancer Cousin        mid 61s; maternal cousin   Colon cancer Other  MGMs brother   Esophageal cancer Neg Hx    Stomach cancer Neg Hx    Rectal cancer Neg Hx    Allergies  Allergen Reactions   Pantoprazole Sodium Diarrhea    Abdominal pain   Ranitidine Diarrhea    abdominal pain   Omeprazole Diarrhea    Abdominal pain   Sulfasalazine Rash    Fever, chills, headache, muscle pain   Current Outpatient Medications on File Prior to Visit  Medication Sig Dispense Refill   Ascorbic Acid (VITAMIN C) 1000 MG tablet Take 1,000 mg by mouth daily.     calcium citrate-vitamin D (CITRACAL+D) 315-200 MG-UNIT per tablet Take 1 tablet by mouth 2 (two) times daily.     Cholecalciferol (VITAMIN D-3) 5000 UNITS TABS Take 5,000 Units by mouth daily.     Coenzyme Q10 (CO Q-10) 200 MG CAPS  Take 200 mg by mouth daily.     CRANBERRY PO Take 650 mg by mouth daily.     estradiol (ESTRACE) 0.1 MG/GM vaginal cream Place 0.5 g vaginally 2 (two) times a week. 42.5 g 11   Glucosamine-Chondroitin (GLUCOSAMINE CHONDR COMPLEX PO) Take 2 capsules by mouth daily.      GRAPE SEED EXTRACT PO Take 1 capsule by mouth daily.     hydrocortisone (ANUSOL-HC) 2.5 % rectal cream Place 1 Application rectally 2 (two) times daily. 30 g 1   hydrocortisone (CORTENEMA) 100 MG/60ML enema Use 60ml every night for 28 days 1680 mL 0   ketoconazole (NIZORAL) 2 % cream Apply 1 Application topically daily. To yeast rash 30 g 1   lansoprazole (PREVACID) 15 MG capsule Take 1 capsule (15 mg total) by mouth 2 (two) times daily before a meal. (Patient taking differently: Take 15 mg by mouth daily. Uses over the counter brand)     LIALDA 1.2 g EC tablet Take 2 tablets by mouth twice daily 120 tablet 2   lidocaine-prilocaine (EMLA) cream Use on vulva/ urethra as needed for pain. 30 g 1   meclizine (ANTIVERT) 25 MG tablet Take 1 tablet (25 mg total) by mouth 3 (three) times daily as needed for dizziness or nausea (vertigo). 20 tablet 0   milk thistle 175 MG tablet Take 175 mg by mouth daily.     Omega-3 Fatty Acids (FISH OIL TRIPLE STRENGTH PO) Take 1 capsule by mouth daily.     Polyethyl Glycol-Propyl Glycol (SYSTANE OP) Place 1 drop into both eyes daily.     vitamin B-12 (CYANOCOBALAMIN) 1000 MCG tablet Take 1,000 mcg by mouth daily.     zinc gluconate 50 MG tablet Take 50 mg by mouth daily.     No current facility-administered medications on file prior to visit.    Review of Systems  Constitutional:  Negative for activity change, appetite change, fatigue, fever and unexpected weight change.  HENT:  Negative for congestion, ear pain, rhinorrhea, sinus pressure and sore throat.   Eyes:  Negative for pain, redness and visual disturbance.  Respiratory:  Negative for cough, shortness of breath and wheezing.    Cardiovascular:  Negative for chest pain and palpitations.  Gastrointestinal:  Negative for abdominal pain, blood in stool, constipation and diarrhea.       Abdomen feels swollen and uncomfortable   Endocrine: Negative for polydipsia and polyuria.  Genitourinary:  Negative for dysuria, frequency and urgency.  Musculoskeletal:  Negative for arthralgias, back pain and myalgias.  Skin:  Negative for pallor and rash.  Allergic/Immunologic: Negative for environmental allergies.  Neurological:  Negative  for dizziness, syncope and headaches.  Hematological:  Negative for adenopathy. Does not bruise/bleed easily.  Psychiatric/Behavioral:  Negative for decreased concentration and dysphoric mood. The patient is not nervous/anxious.        Objective:   Physical Exam Constitutional:      General: She is not in acute distress.    Appearance: Normal appearance. She is well-developed. She is obese. She is not ill-appearing or diaphoretic.  HENT:     Head: Normocephalic and atraumatic.     Right Ear: Tympanic membrane, ear canal and external ear normal.     Left Ear: Tympanic membrane, ear canal and external ear normal.     Nose: Nose normal. No congestion.     Mouth/Throat:     Mouth: Mucous membranes are moist.     Pharynx: Oropharynx is clear. No posterior oropharyngeal erythema.  Eyes:     General: No scleral icterus.    Extraocular Movements: Extraocular movements intact.     Conjunctiva/sclera: Conjunctivae normal.     Pupils: Pupils are equal, round, and reactive to light.  Neck:     Thyroid: No thyromegaly.     Vascular: No carotid bruit or JVD.  Cardiovascular:     Rate and Rhythm: Normal rate and regular rhythm.     Pulses: Normal pulses.     Heart sounds: Normal heart sounds.     No gallop.  Pulmonary:     Effort: Pulmonary effort is normal. No respiratory distress.     Breath sounds: Normal breath sounds. No wheezing.     Comments: Good air exch Chest:     Chest wall: No  tenderness.  Abdominal:     General: Bowel sounds are normal. There is no distension or abdominal bruit.     Palpations: Abdomen is soft. There is no shifting dullness, fluid wave, hepatomegaly, splenomegaly, mass or pulsatile mass.     Tenderness: There is no abdominal tenderness.     Hernia: No hernia is present.     Comments: Protuberant abdomen   Genitourinary:    Comments: Breast exam: No mass, nodules, thickening, tenderness, bulging, retraction, inflamation, nipple discharge or skin changes noted.  No axillary or clavicular LA.     Musculoskeletal:        General: No tenderness. Normal range of motion.     Cervical back: Normal range of motion and neck supple. No rigidity. No muscular tenderness.     Right lower leg: No edema.     Left lower leg: No edema.     Comments: No kyphosis   Lymphadenopathy:     Cervical: No cervical adenopathy.  Skin:    General: Skin is warm and dry.     Coloration: Skin is not pale.     Findings: No erythema or rash.     Comments: Solar lentigines diffusely Fair complexion   Neurological:     Mental Status: She is alert. Mental status is at baseline.     Cranial Nerves: No cranial nerve deficit.     Motor: No abnormal muscle tone.     Coordination: Coordination normal.     Gait: Gait normal.     Deep Tendon Reflexes: Reflexes are normal and symmetric. Reflexes normal.  Psychiatric:        Mood and Affect: Mood normal.        Cognition and Memory: Cognition and memory normal.           Assessment & Plan:   Problem List Items Addressed This  Visit       Cardiovascular and Mediastinum   DVT, lower extremity, distal (HCC)    Clinically improving with eliquis Refilled this for 5 mg bid   At 3 months will stop and do hypercoag w/u      Relevant Medications   apixaban (ELIQUIS) 5 MG TABS tablet   rosuvastatin (CRESTOR) 20 MG tablet     Digestive   GERD (gastroesophageal reflux disease)    Takes prevacid prn Not all the time          Musculoskeletal and Integument   Osteoporosis    Dexa 10/2021  Encouraged more vit D/ level is mildly low Also strength building exercise  Fall prevention is important as well        Other   Vitamin D deficiency    Last vitamin D Lab Results  Component Value Date   VD25OH 24.95 (L) 08/13/2022   Encouraged at least 5000 international units of D3 daily  For bone and overall health      Swelling abdomen    Ongoing Per pt uncomfortable and limiting exercise  Reviewed last labs and GI notes CT scan of abd/pelvis upcoming History of some ascites in the past        Routine general medical examination at a health care facility - Primary    Reviewed health habits including diet and exercise and skin cancer prevention Reviewed appropriate screening tests for age  Also reviewed health mt list, fam hx and immunization status , as well as social and family history   See HPI Labs reviewed and ordered Mammogram utd 07/2022 Colonoscopy 12/2019 with 5 y recall Dexa 10/2021 with no falls or fracture Discussed importance of vit D intake and exercise as tolerated PHQ score of 0       Prediabetes    Lab Results  Component Value Date   HGBA1C 6.3 08/13/2022   disc imp of low glycemic diet and wt loss to prevent DM2       Hyperlipidemia    Disc goals for lipids and reasons to control them Rev last labs with pt Rev low sat fat diet in detail Continue crestor 20 mg daily  HDl down-recommend more exercise when able      Relevant Medications   apixaban (ELIQUIS) 5 MG TABS tablet   rosuvastatin (CRESTOR) 20 MG tablet   History of vaginal bleeding    This was due to vulvar varicosity Was treated       Class 1 obesity due to excess calories with serious comorbidity and body mass index (BMI) of 34.0 to 34.9 in adult    Discussed how this problem influences overall health and the risks it imposes  Reviewed plan for weight loss with lower calorie diet (via better food  choices (lower glycemic and portion control) along with exercise building up to or more than 30 minutes 5 days per week including some aerobic activity and strength training

## 2022-08-20 NOTE — Assessment & Plan Note (Signed)
Ongoing Per pt uncomfortable and limiting exercise  Reviewed last labs and GI notes CT scan of abd/pelvis upcoming History of some ascites in the past

## 2022-08-20 NOTE — Assessment & Plan Note (Signed)
Reviewed health habits including diet and exercise and skin cancer prevention Reviewed appropriate screening tests for age  Also reviewed health mt list, fam hx and immunization status , as well as social and family history   See HPI Labs reviewed and ordered Mammogram utd 07/2022 Colonoscopy 12/2019 with 5 y recall Dexa 10/2021 with no falls or fracture Discussed importance of vit D intake and exercise as tolerated PHQ score of 0

## 2022-08-20 NOTE — Assessment & Plan Note (Signed)
This was due to vulvar varicosity Was treated

## 2022-08-20 NOTE — Assessment & Plan Note (Signed)
Takes prevacid prn Not all the time

## 2022-08-20 NOTE — Assessment & Plan Note (Signed)
Last vitamin D Lab Results  Component Value Date   VD25OH 24.95 (L) 08/13/2022   Encouraged at least 5000 international units of D3 daily  For bone and overall health

## 2022-08-20 NOTE — Assessment & Plan Note (Signed)
Lab Results  Component Value Date   HGBA1C 6.3 08/13/2022   disc imp of low glycemic diet and wt loss to prevent DM2

## 2022-08-20 NOTE — Assessment & Plan Note (Signed)
Clinically improving with eliquis Refilled this for 5 mg bid   At 3 months will stop and do hypercoag w/u

## 2022-08-20 NOTE — Assessment & Plan Note (Signed)
Dexa 10/2021  Encouraged more vit D/ level is mildly low Also strength building exercise  Fall prevention is important as well

## 2022-08-20 NOTE — Patient Instructions (Addendum)
On the days you don't do heavy work   Add some Runner, broadcasting/film/video to your routine, this is important for bone and brain health and can reduce your risk of falls and help your body use insulin properly and regulate weight  Light weights, exercise bands , and internet videos are a good way to start  Yoga (chair or regular), machines , floor exercises or a gym with machines are also good options   Stick with the 5000 international units of vitamin d3 daily at least   Try to get most of your carbohydrates from produce (with the exception of white potatoes)  Eat less bread/pasta/rice/snack foods/cereals/sweets and other items from the middle of the grocery store (processed carbs)  Call for follow up with you are 2 weeks away from finishing 3 months of eliquis and I will order labs   Take care of yourself

## 2022-08-20 NOTE — Assessment & Plan Note (Signed)
Disc goals for lipids and reasons to control them Rev last labs with pt Rev low sat fat diet in detail Continue crestor 20 mg daily  HDl down-recommend more exercise when able

## 2022-08-23 ENCOUNTER — Ambulatory Visit (HOSPITAL_COMMUNITY)
Admission: RE | Admit: 2022-08-23 | Discharge: 2022-08-23 | Disposition: A | Discharge status: Home / Self Care | Payer: Medicare Other | Source: Ambulatory Visit | Attending: Gastroenterology | Admitting: Gastroenterology

## 2022-08-23 DIAGNOSIS — K449 Diaphragmatic hernia without obstruction or gangrene: Secondary | ICD-10-CM | POA: Diagnosis not present

## 2022-08-23 DIAGNOSIS — R140 Abdominal distension (gaseous): Secondary | ICD-10-CM | POA: Insufficient documentation

## 2022-08-23 DIAGNOSIS — K760 Fatty (change of) liver, not elsewhere classified: Secondary | ICD-10-CM | POA: Diagnosis not present

## 2022-08-23 DIAGNOSIS — K5730 Diverticulosis of large intestine without perforation or abscess without bleeding: Secondary | ICD-10-CM | POA: Diagnosis not present

## 2022-08-23 DIAGNOSIS — R188 Other ascites: Secondary | ICD-10-CM | POA: Diagnosis not present

## 2022-08-23 MED ORDER — IOHEXOL 300 MG/ML  SOLN
100.0000 mL | Freq: Once | INTRAMUSCULAR | Status: AC | PRN
  Administered 2022-08-23: 100 mL via INTRAVENOUS

## 2022-08-23 MED ORDER — SODIUM CHLORIDE (PF) 0.9 % IJ SOLN
INTRAMUSCULAR | Status: AC
  Filled 2022-08-23: qty 50

## 2022-08-24 ENCOUNTER — Telehealth: Payer: Self-pay | Admitting: Gastroenterology

## 2022-08-24 NOTE — Telephone Encounter (Signed)
Pt said she had her CT scan done yesterday.  She had discussed with [facility] about being prescribed lasix during her last OV.  Her abdomen is still distended and wants to discuss next options.

## 2022-08-24 NOTE — Telephone Encounter (Signed)
I have spoken to patient to advise that we are awaiting CT results (which may take several days to result). Once those results have been received and reviewed, [facility] can make additional recommendations for diuretics if needed. Patient verbalizes understanding of this.

## 2022-08-26 ENCOUNTER — Other Ambulatory Visit: Payer: Self-pay

## 2022-08-26 ENCOUNTER — Emergency Department (HOSPITAL_COMMUNITY)
Admission: EM | Admit: 2022-08-26 | Discharge: 2022-08-26 | Disposition: A | Discharge status: Home / Self Care | Payer: Medicare Other | Attending: Emergency Medicine | Admitting: Emergency Medicine

## 2022-08-26 DIAGNOSIS — R109 Unspecified abdominal pain: Secondary | ICD-10-CM | POA: Diagnosis present

## 2022-08-26 DIAGNOSIS — C800 Disseminated malignant neoplasm, unspecified: Secondary | ICD-10-CM | POA: Insufficient documentation

## 2022-08-26 DIAGNOSIS — R8289 Other abnormal findings on cytological and histological examination of urine: Secondary | ICD-10-CM | POA: Insufficient documentation

## 2022-08-26 DIAGNOSIS — Z7901 Long term (current) use of anticoagulants: Secondary | ICD-10-CM | POA: Insufficient documentation

## 2022-08-26 DIAGNOSIS — R180 Malignant ascites: Secondary | ICD-10-CM | POA: Diagnosis not present

## 2022-08-26 DIAGNOSIS — R188 Other ascites: Secondary | ICD-10-CM | POA: Diagnosis not present

## 2022-08-26 LAB — COMPREHENSIVE METABOLIC PANEL
ALT: 12 U/L (ref 0–44)
AST: 18 U/L (ref 15–41)
Albumin: 3.3 g/dL — ABNORMAL LOW (ref 3.5–5.0)
Alkaline Phosphatase: 58 U/L (ref 38–126)
Anion gap: 4 — ABNORMAL LOW (ref 5–15)
BUN: 8 mg/dL (ref 8–23)
CO2: 27 mmol/L (ref 22–32)
Calcium: 8.8 mg/dL — ABNORMAL LOW (ref 8.9–10.3)
Chloride: 102 mmol/L (ref 98–111)
Creatinine, Ser: 0.56 mg/dL (ref 0.44–1.00)
GFR, Estimated: >60 mL/min (ref >60)
Glucose, Bld: 93 mg/dL (ref 70–99)
Potassium: 4 mmol/L (ref 3.5–5.1)
Sodium: 133 mmol/L — ABNORMAL LOW (ref 135–145)
Total Bilirubin: 0.9 mg/dL (ref 0.3–1.2)
Total Protein: 6.6 g/dL (ref 6.5–8.1)

## 2022-08-26 LAB — CBC
HCT: 39.1 % (ref 36.0–46.0)
Hemoglobin: 12.7 g/dL (ref 12.0–15.0)
MCH: 27.9 pg (ref 26.0–34.0)
MCHC: 32.5 g/dL (ref 30.0–36.0)
MCV: 85.7 fL (ref 80.0–100.0)
Platelets: 350 10*3/uL (ref 150–400)
RBC: 4.56 MIL/uL (ref 3.87–5.11)
RDW: 14.1 % (ref 11.5–15.5)
WBC: 6.9 10*3/uL (ref 4.0–10.5)
nRBC: 0 % (ref 0.0–0.2)

## 2022-08-26 LAB — URINALYSIS, ROUTINE W REFLEX MICROSCOPIC
Bacteria, UA: NONE SEEN
Bilirubin Urine: NEGATIVE
Glucose, UA: NEGATIVE mg/dL
Ketones, ur: 5 mg/dL — AB
Leukocytes,Ua: NEGATIVE
Nitrite: NEGATIVE
Protein, ur: 30 mg/dL — AB
Specific Gravity, Urine: 1.021 (ref 1.005–1.030)
pH: 6 (ref 5.0–8.0)

## 2022-08-26 LAB — LIPASE, BLOOD: Lipase: 26 U/L (ref 11–51)

## 2022-08-26 NOTE — ED Provider Notes (Signed)
[place] EMERGENCY DEPARTMENT AT [facility] Provider Note   CSN: [id number] Arrival date & time: 08/26/22  1139     History  Chief Complaint  Patient presents with   Abdominal Pain    Rachel Carlson is a 77 y.o. female.  She has PMH of DVT on Eliquis, ulcerative colitis on Lialda and high cholesterol.  Presents the ER today for continued abdominal distention that has been worsening over the past couple of months.  Had outpatient ultrasound a couple of months ago that showed mild ascites had elastography that showed no cirrhosis.  Underwent CT scan 3 days ago but does not have results yet from this.  She states she is having continued discomfort and swelling of her abdomen.  Denies nausea or vomiting.  No urinary symptoms, no lower extremity swelling, no chest pain or shortness of breath.   Abdominal Pain      Home Medications Prior to Admission medications   Medication Sig Start Date End Date Taking? Authorizing Provider  apixaban (ELIQUIS) 5 MG TABS tablet Take 1 tablet (5 mg total) by mouth 2 (two) times daily. 08/20/22   Tower, Audrie Gallus, MD  Ascorbic Acid (VITAMIN C) 1000 MG tablet Take 1,000 mg by mouth daily.    [provider]  calcium citrate-vitamin D (CITRACAL+D) 315-200 MG-UNIT per tablet Take 1 tablet by mouth 2 (two) times daily.    [provider]  Cholecalciferol (VITAMIN D-3) 5000 UNITS TABS Take 5,000 Units by mouth daily.    [provider]  Coenzyme Q10 (CO Q-10) 200 MG CAPS Take 200 mg by mouth daily.    [provider]  CRANBERRY PO Take 650 mg by mouth daily.    [provider]  estradiol (ESTRACE) 0.1 MG/GM vaginal cream Place 0.5 g vaginally 2 (two) times a week. 01/18/22   Marguerita Beards, MD  Glucosamine-Chondroitin (GLUCOSAMINE CHONDR COMPLEX PO) Take 2 capsules by mouth daily.     [provider]  GRAPE SEED EXTRACT PO Take 1 capsule by mouth daily.    [provider]   hydrocortisone (ANUSOL-HC) 2.5 % rectal cream Place 1 Application rectally 2 (two) times daily. 10/13/21   Iva Boop, MD  hydrocortisone Laurance Flatten) 100 MG/60ML enema Use 60ml every night for 28 days 10/20/21   Unk Lightning, PA  ketoconazole (NIZORAL) 2 % cream Apply 1 Application topically daily. To yeast rash 08/17/21   Tower, Audrie Gallus, MD  lansoprazole (PREVACID) 15 MG capsule Take 1 capsule (15 mg total) by mouth 2 (two) times daily before a meal. Patient taking differently: Take 15 mg by mouth daily. Uses over the counter brand 09/02/17   Iva Boop, MD  LIALDA 1.2 g EC tablet Take 2 tablets by mouth twice daily 07/10/22   Iva Boop, MD  lidocaine-prilocaine (EMLA) cream Use on vulva/ urethra as needed for pain. 04/06/22   Marguerita Beards, MD  meclizine (ANTIVERT) 25 MG tablet Take 1 tablet (25 mg total) by mouth 3 (three) times daily as needed for dizziness or nausea (vertigo). 01/03/22   Tower, Audrie Gallus, MD  milk thistle 175 MG tablet Take 175 mg by mouth daily.    [provider]  Omega-3 Fatty Acids (FISH OIL TRIPLE STRENGTH PO) Take 1 capsule by mouth daily.    [provider]  Polyethyl Glycol-Propyl Glycol (SYSTANE OP) Place 1 drop into both eyes daily.    [provider]  rosuvastatin (CRESTOR) 20 MG tablet Take  1 tablet (20 mg total) by mouth at bedtime. 08/20/22   Tower, Audrie Gallus, MD  vitamin B-12 (CYANOCOBALAMIN) 1000 MCG tablet Take 1,000 mcg by mouth daily.    [provider]  zinc gluconate 50 MG tablet Take 50 mg by mouth daily.    [provider]      Allergies    Pantoprazole sodium, Ranitidine, Omeprazole, and Sulfasalazine    Review of Systems   Review of Systems  Gastrointestinal:  Positive for abdominal pain.    Physical Exam Updated Vital Signs BP 124/85 (BP Location: Right Arm)   Pulse 91   Temp 98 F (36.7 C) (Oral)   Resp 18   Ht 5\' 2"  (1.575 m)   Wt 86.2 kg   LMP 05/29/1996   SpO2 94%    BMI 34.76 kg/m  Physical Exam Vitals and nursing note reviewed.  Constitutional:      General: She is not in acute distress.    Appearance: She is well-developed.  HENT:     Head: Normocephalic and atraumatic.  Eyes:     Conjunctiva/sclera: Conjunctivae normal.  Cardiovascular:     Rate and Rhythm: Normal rate and regular rhythm.     Heart sounds: No murmur heard. Pulmonary:     Effort: Pulmonary effort is normal. No respiratory distress.     Breath sounds: Normal breath sounds.  Abdominal:     General: There is distension.     Palpations: Abdomen is soft. There is fluid wave.     Tenderness: There is no abdominal tenderness.  Musculoskeletal:        General: No swelling.     Cervical back: Neck supple.  Skin:    General: Skin is warm and dry.     Capillary Refill: Capillary refill takes less than 2 seconds.  Neurological:     General: No focal deficit present.     Mental Status: She is alert.  Psychiatric:        Mood and Affect: Mood normal.     ED Results / Procedures / Treatments   Labs (all labs ordered are listed, but only abnormal results are displayed) Labs Reviewed  COMPREHENSIVE METABOLIC PANEL - Abnormal; Notable for the following components:      Result Value   Sodium 133 (*)    Calcium 8.8 (*)    Albumin 3.3 (*)    Anion gap 4 (*)    All other components within normal limits  URINALYSIS, ROUTINE W REFLEX MICROSCOPIC - Abnormal; Notable for the following components:   APPearance HAZY (*)    Hgb urine dipstick SMALL (*)    Ketones, ur 5 (*)    Protein, ur 30 (*)    Non Squamous Epithelial 0-5 (*)    All other components within normal limits  LIPASE, BLOOD  CBC  CA 125  CEA    EKG None  Radiology No results found.  Procedures Procedures    Medications Ordered in ED Medications - No data to display  ED Course/ Medical Decision Making/ A&P                             Medical Decision Making This patient presents to the ED for  concern of abdominal distention, this involves an extensive number of treatment options, and is a complaint that carries with it a high risk of complications and morbidity.  The differential diagnosis includes ascites, cirrhosis, malignant ascites, bowel obstruction, other  Co morbidities that complicate the patient evaluation :   ulcerative colitis   Additional history obtained:  Additional history obtained from EMR External records from outside source obtained and reviewed including notes, labs, CT scan, [place]   Lab Tests:  I Ordered, and personally interpreted labs.  The pertinent results include: Assuring CBC, CMP, lipase  Urinalysis had 11-20 red blood cells, 6-10 white blood cells with no bacteria and no leukocytes.  Patient is asymptomatic, no indication for further testing    Problem List / ED Course / Critical interventions / Medication management  Been having several months of worsening abdominal distention, being evaluated for ascites.  She been following with GI.  Has CT scan 3 days ago and has not received results yet.  She concerned about her continued distention and came to the ER for further evaluation.  Labs were reassuring, her Did not show cirrhosis.  CT scan had not been read yet so I called radiology who was able to complete the read today.  She has moderate ascites now with omental caking in the left lower quadrant and findings consistent with carcinomatosis.  She did have some sigmoid colon thickening.  She has known ulcerative colitis is not having any increase diarrhea, bloody stools or other symptoms.  Instructed on finding and advised to follow-up with GI for this.  I consulted Dr. Ellin Saba, the oncologist.  We discussed the patient's labs and findings on CT.  He recommended ordering CA125 and CEA.  He requested I sent him a secure chat with the patient's information and he will call her for follow-up this week.  Is not having any pain, she is hemodynamically  stable, no indication for admission today.  She is informed of all of her results, she was tearful but verbalized understanding.  Her son who lives nearby was at bedside.  Discussed she will need to undergo further testing to determine treatment and prognosis.  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           Final Clinical Impression(s) / ED Diagnoses Final diagnoses:  Ascites, malignant    Rx / DC Orders ED Discharge Orders          Ordered    CA 125  Status:  Canceled        08/26/22 1607    CEA  Status:  Canceled        08/26/22 [address], PA-C 08/26/22 1654    Bethann Berkshire, MD 08/27/22 1206

## 2022-08-26 NOTE — Discharge Instructions (Addendum)
You were evaluated in the ER today for abdominal swelling.  Your blood work was reassuring.  You have fluid in your abdomen called ascites.  This likely due to cancer in your abdomen.  You need further workup with the oncologist.  I discussed your case with him in the ER today.  He should call you with appointment.  If you do not hear from him make sure you call to schedule appointment for follow-up.  If you develop vomiting, abdominal pain or new or worsening symptoms come back to the ER right away.

## 2022-08-26 NOTE — ED Provider Triage Note (Signed)
Emergency Medicine Provider Triage Evaluation Note  AGATA KOCOL , a 77 y.o. female  was evaluated in triage.  Pt complains of abdominal swelling x 2 months.  States it is getting worse.  Has history of high cholesterol and ulcerative colitis.  Review of Systems  Positive: Abdominal swelling Negative: Fever, vomiting  Physical Exam  BP (!) 161/87 (BP Location: Right Arm)   Pulse (!) 102   Temp 98.1 F (36.7 C) (Oral)   Resp 19   Ht 5\' 2"  (1.575 m)   Wt 86.2 kg   LMP 05/29/1996   SpO2 95%   BMI 34.75 kg/m  Gen:   Awake, no distress   Resp:  Normal effort  MSK:   Moves extremities without difficulty  Other:    Medical Decision Making  Medically screening exam initiated at 1:22 PM.  Appropriate orders placed.  NEEYA SEVERT was informed that the remainder of the evaluation will be completed by another provider, this initial triage assessment does not replace that evaluation, and the importance of remaining in the ED until their evaluation is complete.     Ma Rings, [place] 08/26/22 1323

## 2022-08-26 NOTE — ED Triage Notes (Signed)
Abd swelling x 2 months. Pt states has had [place] and CT scan and waiting on results. Pt denies pain but feels very uncomfortable. Denies sob, denies n/v/d. PT states occasionally with walking legs "feel like jelly"

## 2022-08-26 NOTE — ED Triage Notes (Signed)
Pt states she was put on spironolactone but urine output was not increased it just caused nausea. Pt stopped taking approx 3 weeks ago.

## 2022-08-28 ENCOUNTER — Ambulatory Visit: Payer: Medicare Other

## 2022-08-28 VITALS — Ht 62.0 in | Wt 190.0 lb

## 2022-08-28 DIAGNOSIS — Z0000 Encounter for general adult medical examination without abnormal findings: Secondary | ICD-10-CM | POA: Diagnosis not present

## 2022-08-28 NOTE — Progress Notes (Signed)
Subjective:   Rachel Carlson is a 77 y.o. female who presents for Medicare Annual (Subsequent) preventive examination.  Visit Complete: Virtual  I connected with  Rachel Carlson on 08/28/22 by a audio enabled telemedicine application and verified that I am speaking with the correct person using two identifiers.  Patient Location: Home  Provider Location: Home Office  I discussed the limitations of evaluation and management by telemedicine. The patient expressed understanding and agreed to proceed.  Vital Signs: Unable to obtain new vitals due to this being a telehealth visit.   Review of Systems      Cardiac Risk Factors include: advanced age (>63men, >57 women);hypertension;dyslipidemia;obesity (BMI >30kg/m2);sedentary lifestyle     Objective:    Today's Vitals   08/28/22 1455  Weight: 190 lb (86.2 kg)  Height: 5\' 2"  (1.575 m)   Body mass index is 34.75 kg/m.     08/28/2022    3:03 PM 08/26/2022   11:59 AM 08/25/2021    3:11 PM 10/13/2020    2:54 PM 08/25/2020    9:48 AM 08/25/2019    9:46 AM 01/01/2018    3:44 PM  Advanced Directives  Does Patient Have a Medical Advance Directive? Yes No Yes Yes Yes Yes Yes  Type of Estate agent of [place];Living will  Healthcare Power of [place];Living will Healthcare Power of [place];Living will Healthcare Power of [place];Living will Healthcare Power of [place];Living will   Does patient want to make changes to medical advance directive?   No - Patient declined No - Patient declined     Copy of Healthcare Power of Attorney in Chart? No - copy requested  No - copy requested No - copy requested No - copy requested No - copy requested   Would patient like information on creating a medical advance directive?  No - Guardian declined         Current Medications (verified) Outpatient Encounter Medications as of 08/28/2022  Medication Sig   apixaban (ELIQUIS) 5 MG TABS tablet Take 1 tablet (5 mg total) by  mouth 2 (two) times daily.   Ascorbic Acid (VITAMIN C) 1000 MG tablet Take 1,000 mg by mouth daily.   calcium citrate-vitamin D (CITRACAL+D) 315-200 MG-UNIT per tablet Take 1 tablet by mouth 2 (two) times daily.   Cholecalciferol (VITAMIN D-3) 5000 UNITS TABS Take 5,000 Units by mouth daily.   Coenzyme Q10 (CO Q-10) 200 MG CAPS Take 200 mg by mouth daily.   CRANBERRY PO Take 650 mg by mouth daily.   estradiol (ESTRACE) 0.1 MG/GM vaginal cream Place 0.5 g vaginally 2 (two) times a week.   Glucosamine-Chondroitin (GLUCOSAMINE CHONDR COMPLEX PO) Take 2 capsules by mouth daily.    GRAPE SEED EXTRACT PO Take 1 capsule by mouth daily.   hydrocortisone (ANUSOL-HC) 2.5 % rectal cream Place 1 Application rectally 2 (two) times daily.   lansoprazole (PREVACID) 15 MG capsule Take 1 capsule (15 mg total) by mouth 2 (two) times daily before a meal. (Patient taking differently: Take 15 mg by mouth daily. Uses over the counter brand)   LIALDA 1.2 g EC tablet Take 2 tablets by mouth twice daily   lidocaine-prilocaine (EMLA) cream Use on vulva/ urethra as needed for pain.   milk thistle 175 MG tablet Take 175 mg by mouth daily.   Omega-3 Fatty Acids (FISH OIL TRIPLE STRENGTH PO) Take 1 capsule by mouth daily.   Polyethyl Glycol-Propyl Glycol (SYSTANE OP) Place 1 drop into both eyes daily.   rosuvastatin (  CRESTOR) 20 MG tablet Take 1 tablet (20 mg total) by mouth at bedtime.   vitamin B-12 (CYANOCOBALAMIN) 1000 MCG tablet Take 1,000 mcg by mouth daily.   zinc gluconate 50 MG tablet Take 50 mg by mouth daily.   hydrocortisone (CORTENEMA) 100 MG/60ML enema Use 60ml every night for 28 days (Patient not taking: Reported on 08/28/2022)   ketoconazole (NIZORAL) 2 % cream Apply 1 Application topically daily. To yeast rash (Patient not taking: Reported on 08/28/2022)   meclizine (ANTIVERT) 25 MG tablet Take 1 tablet (25 mg total) by mouth 3 (three) times daily as needed for dizziness or nausea (vertigo). (Patient not  taking: Reported on 08/28/2022)   No facility-administered encounter medications on file as of 08/28/2022.    Allergies (verified) Pantoprazole sodium, Ranitidine, Omeprazole, and Sulfasalazine   History: Past Medical History:  Diagnosis Date   Blood transfusion without reported diagnosis    Chronic right ear pain    normal MRI   Esophageal stricture    Family history of breast cancer    Family history of colon cancer    Family history of melanoma    Family history of prostate cancer    Fibroids    uterine, history of   GERD (gastroesophageal reflux disease)    History of uterine prolapse    Hx of adenomatous polyp of colon 10/19/2014   Hyperlipidemia    Obesity    PVC (premature ventricular contraction)    first dx by holter in 1980's, echo (6/10) EF 55-60%, normal diastolic fxn, normal size RV and fxn, mild MR, PASP   Squamous cell carcinoma of arm    Tubal pregnancy    Ulcerative proctosigmoiditis (HCC)    Urge urinary incontinence 06/03/2007   Qualifier: Diagnosis of  By: Lindwood Qua CMA, Jerl Santos     Past Surgical History:  Procedure Laterality Date   BLADDER NECK SUSPENSION  1998   BREAST BIOPSY Left    CATARACT EXTRACTION Right 01/2020   CATARACT EXTRACTION W/PHACO Left 08/12/2020   Procedure: CATARACT EXTRACTION PHACO AND INTRAOCULAR LENS PLACEMENT LEFT EYE;  Surgeon: Fabio Pierce, MD;  Location: [facility];  Service: Ophthalmology;  Laterality: Left;  left CDE=10.71   COLONOSCOPY  05/18/2011/10/12/14   Dr. Stan Head   ECTOPIC PREGNANCY SURGERY  1980   ESOPHAGOGASTRODUODENOSCOPY (EGD) WITH ESOPHAGEAL DILATION  04/03/2012   Dr. Stan Head   PARTIAL HYSTERECTOMY     abdominal, fibroids, and prolapse (1998) bladder tack   SKIN CANCER EXCISION     UPPER GASTROINTESTINAL ENDOSCOPY     Family History  Problem Relation Age of Onset   Sudden death Father 70       "coronary arteriosclerosis" on death certificate   Heart attack Mother 9   Transient  ischemic attack Mother    Diabetes type II Mother    Sudden death Mother        died age 48   Diabetes Mother    Coronary artery disease Mother    Skin cancer Mother    Barrett's esophagus Mother    Arrhythmia Brother    Colon cancer Brother 34   Breast cancer Sister        dx in her 36s   Colon polyps Sister        adenomatous   Colon cancer Son 37       lynch syndrome   Melanoma Maternal Uncle    Congestive Heart Failure Maternal Grandmother    Prostate cancer Maternal Grandfather  dx in 70s   Tuberculosis Paternal Grandfather    Skin cancer Maternal Aunt    Barrett's esophagus Maternal Aunt    Other Son        parotid gland tumor   Ehlers-Danlos syndrome Daughter    Colon cancer Cousin        mid 59s; maternal cousin   Colon cancer Other        MGMs brother   Esophageal cancer Neg Hx    Stomach cancer Neg Hx    Rectal cancer Neg Hx    Social History   Socioeconomic History   Marital status: Married    Spouse name: Not on file   Number of children: 3   Years of education: Not on file   Highest education level: Not on file  Occupational History   Occupation: International aid/development worker    Comment:      Employer: unemployed  Tobacco Use   Smoking status: Never   Smokeless tobacco: Never  Vaping Use   Vaping status: Never Used  Substance and Sexual Activity   Alcohol use: No    Alcohol/week: 0.0 standard drinks of alcohol    Comment: rarely beer   Drug use: No   Sexual activity: Not Currently  Other Topics Concern   Not on file  Social History Narrative   Retired International aid/development worker, 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)   Grown children   Never smoker no alcohol tobacco or drug use   Social Determinants of Corporate investment banker Strain: Low Risk  (08/28/2022)   Overall Financial Resource Strain (CARDIA)    Difficulty of Paying Living Expenses: Not hard at all  Food Insecurity: No Food Insecurity (08/28/2022)   Hunger Vital Sign    Worried About Running  Out of Food in the Last Year: Never true    Ran Out of Food in the Last Year: Never true  Transportation Needs: No Transportation Needs (08/28/2022)   PRAPARE - Administrator, Civil Service (Medical): No    Lack of Transportation (Non-Medical): No  Physical Activity: Inactive (08/28/2022)   Exercise Vital Sign    Days of Exercise per Week: 0 days    Minutes of Exercise per Session: 0 min  Stress: No Stress Concern Present (08/28/2022)   [employer] of Occupational Health - Occupational Stress Questionnaire    Feeling of Stress : Not at all  Social Connections: Moderately Integrated (08/28/2022)   Social Connection and Isolation Panel [NHANES]    Frequency of Communication with Friends and Family: More than three times a week    Frequency of Social Gatherings with Friends and Family: More than three times a week    Attends Religious Services: Never    Database administrator or Organizations: Yes    Attends Engineer, structural: More than 4 times per year    Marital Status: Married    Tobacco Counseling Counseling given: Not Answered   Clinical Intake:  Pre-visit preparation completed: Yes  Pain : No/denies pain     BMI - recorded: 34.75 Nutritional Status: BMI > 30  Obese Nutritional Risks: Nausea/ vomitting/ diarrhea (ulcerative collitis) Diabetes: No  How often do you need to have someone help you when you read instructions, pamphlets, or other written materials from your doctor or pharmacy?: 1 - Never  Interpreter Needed?: No  Information entered by :: C.Joshva Labreck LPN   Activities of Daily Living    08/28/2022    3:05 PM  In  your present state of health, do you have any difficulty performing the following activities:  Hearing? 0  Vision? 0  Difficulty concentrating or making decisions? 0  Walking or climbing stairs? 0  Dressing or bathing? 0  Doing errands, shopping? 0  Preparing Food and eating ? N  Using the Toilet? N  In the past  six months, have you accidently leaked urine? Y  Comment under urologist care  Do you have problems with loss of bowel control? N  Managing your Medications? N  Managing your Finances? N  Housekeeping or managing your Housekeeping? N    Patient Care Team: Tower, Audrie Gallus, MD as PCP - General  Indicate any recent Medical Services you may have received from other than [facility] providers in the past year (date may be approximate).     Assessment:   This is a routine wellness examination for [place].  Hearing/Vision screen Hearing Screening - Comments:: Denies hearing difficulties   Vision Screening - Comments:: Readers- Dr.Cotter in [place] - UTD on eye exams  Dietary issues and exercise activities discussed:     Goals Addressed             This Visit's Progress    Patient Stated       Stay active        Depression Screen    08/28/2022    3:02 PM 08/20/2022    2:02 PM 08/01/2022    9:39 AM 07/23/2022    2:26 PM 01/03/2022   11:41 AM 08/25/2021    3:05 PM 08/17/2021    9:18 AM  PHQ 2/9 Scores  PHQ - 2 Score 0 0 0 0 0 0 0  PHQ- 9 Score 0 1 2 1        Fall Risk    08/28/2022    3:05 PM 08/01/2022    9:38 AM 01/03/2022   11:41 AM 08/25/2021    3:10 PM 08/17/2021    9:18 AM  Fall Risk   Falls in the past year? 0 0 0 0 0  Number falls in past yr: 0 0 0 0   Injury with Fall? 0 0 0 0   Risk for fall due to : No Fall Risks No Fall Risks No Fall Risks No Fall Risks   Follow up Falls prevention discussed;Falls evaluation completed Falls evaluation completed Falls evaluation completed  Falls evaluation completed    MEDICARE RISK AT HOME:  Medicare Risk at Home - 08/28/22 1506     Any stairs in or around the home? No    If so, are there any without handrails? No    Home free of loose throw rugs in walkways, pet beds, electrical cords, etc? Yes    Adequate lighting in your home to reduce risk of falls? Yes    Life alert? No    Use of a cane, walker or w/c? No    Grab bars in  the bathroom? Yes    Shower chair or bench in shower? Yes    Elevated toilet seat or a handicapped toilet? Yes             TIMED UP AND GO:  Was the test performed?  No    Cognitive Function:    08/25/2020    9:58 AM 08/25/2019    9:52 AM 10/20/2015   10:28 AM  MMSE - Mini Mental State Exam  Orientation to time 5 5 5   Orientation to Place 5 5 5   Registration 3 3 3  Attention/ Calculation 5 5 0  Recall 3 3 3   Language- name 2 objects   0  Language- repeat 1 1 1   Language- follow 3 step command   3  Language- read & follow direction   0  Write a sentence   0  Copy design   0  Total score   20        08/28/2022    3:07 PM 08/25/2021    3:11 PM  6CIT Screen  What Year? 0 points 0 points  What month? 0 points 0 points  What time? 0 points 0 points  Count back from 20 0 points 0 points  Months in reverse 0 points 0 points  Repeat phrase 0 points 0 points  Total Score 0 points 0 points    Immunizations Immunization History  Administered Date(s) Administered   Influenza Split 12/04/2010, 10/31/2015   Influenza Whole 12/29/2008, 11/22/2009   Influenza, High Dose Seasonal PF 11/20/2018, 01/04/2022   Influenza,inj,Quad PF,6+ Mos 12/06/2017   Influenza-Unspecified 11/29/2012, 01/04/2016, 10/30/2016   Moderna Sars-Covid-2 Vaccination 04/02/2019, 05/05/2019   Pneumococcal Conjugate-13 07/20/2015   Pneumococcal Polysaccharide-23 08/17/2021   Pneumococcal-Unspecified 01/30/2013   Respiratory Syncytial Virus Vaccine,Recomb Aduvanted(Arexvy) 01/04/2022   Td 12/29/2005   Tdap 10/31/2015   Zoster Recombinant(Shingrix) 01/11/2017, 04/11/2017   Zoster, Live 06/10/2009    TDAP status: Up to date  Flu Vaccine status: Up to date  Pneumococcal vaccine status: Up to date  Covid-19 vaccine status: Information provided on how to obtain vaccines.   Qualifies for Shingles Vaccine? Yes   Zostavax completed  unknown   Shingrix Completed?: Yes  Screening Tests Health  Maintenance  Topic Date Due   Medicare Annual Wellness (AWV)  08/26/2022   COVID-19 Vaccine (3 - 2023-24 season) 09/05/2022 (Originally 09/29/2021)   INFLUENZA VACCINE  08/30/2022   MAMMOGRAM  08/09/2023   DTaP/Tdap/Td (3 - Td or Tdap) 10/30/2025   Colonoscopy  01/01/2027   Pneumonia Vaccine 28+ Years old  Completed   DEXA SCAN  Completed   Hepatitis C Screening  Completed   Zoster Vaccines- Shingrix  Completed   HPV VACCINES  Aged Out    Health Maintenance  Health Maintenance Due  Topic Date Due   Medicare Annual Wellness (AWV)  08/26/2022    Colorectal cancer screening: Type of screening: Colonoscopy. Completed 01/01/20. Repeat every 5 years Per pt  Mammogram status: Completed 08/09/22. Repeat every year  Bone Density status: Completed 10/31/21. Results reflect: Bone density results: OSTEOPOROSIS. Repeat every 2 years.  Lung Cancer Screening: (Low Dose CT Chest recommended if Age 52-80 years, 20 pack-year currently smoking OR have quit w/in 15years.) does not qualify.   Lung Cancer Screening Referral: n/a  Additional Screening:  Hepatitis C Screening: does qualify; Completed 07/12/14  Vision Screening: Recommended annual ophthalmology exams for early detection of glaucoma and other disorders of the eye. Is the patient up to date with their annual eye exam?  Yes  Who is the provider or what is the name of the office in which the patient attends annual eye exams? Dr.Cotter If pt is not established with a provider, would they like to be referred to a provider to establish care? No .   Dental Screening: Recommended annual dental exams for proper oral hygiene    Community Resource Referral / Chronic Care Management: CRR required this visit?  No   CCM required this visit?  No     Plan:     I have personally reviewed and noted the  following in the patient's chart:   Medical and social history Use of alcohol, tobacco or illicit drugs  Current medications and  supplements including opioid prescriptions. Patient is not currently taking opioid prescriptions. Functional ability and status Nutritional status Physical activity Advanced directives List of other physicians Hospitalizations, surgeries, and ER visits in previous 12 months Vitals Screenings to include cognitive, depression, and falls Referrals and appointments  In addition, I have reviewed and discussed with patient certain preventive protocols, quality metrics, and best practice recommendations. A written personalized care plan for preventive services as well as general preventive health recommendations were provided to patient.     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

## 2022-08-28 NOTE — Patient Instructions (Signed)
Rachel Carlson , Thank you for taking time to come for your Medicare Wellness Visit. I appreciate your ongoing commitment to your health goals. Please review the following plan we discussed and let me know if I can assist you in the future.   Referrals/Orders/Follow-Ups/Clinician Recommendations: Aim for 30 minutes of exercise or brisk walking, 6-8 glasses of water, and 5 servings of fruits and vegetables each day.   This is a list of the screening recommended for you and due dates:  Health Maintenance  Topic Date Due   Medicare Annual Wellness Visit  08/26/2022   COVID-19 Vaccine (3 - 2023-24 season) 09/05/2022*   Flu Shot  08/30/2022   Mammogram  08/09/2023   DTaP/Tdap/Td vaccine (3 - Td or Tdap) 10/30/2025   Colon Cancer Screening  01/01/2027   Pneumonia Vaccine  Completed   DEXA scan (bone density measurement)  Completed   Hepatitis C Screening  Completed   Zoster (Shingles) Vaccine  Completed   HPV Vaccine  Aged Out  *Topic was postponed. The date shown is not the original due date.    Advanced directives: (Copy Requested) Please bring a copy of your health care power of attorney and living will to the office to be added to your chart at your convenience.  Next Medicare Annual Wellness Visit scheduled for next year: Yes  Preventive Care 75 Years and Older, Female Preventive care refers to lifestyle choices and visits with your health care provider that can promote health and wellness. What does preventive care include? A yearly physical exam. This is also called an annual well check. Dental exams once or twice a year. Routine eye exams. Ask your health care provider how often you should have your eyes checked. Personal lifestyle choices, including: Daily care of your teeth and gums. Regular physical activity. Eating a healthy diet. Avoiding tobacco and drug use. Limiting alcohol use. Practicing safe sex. Taking low-dose aspirin every day. Taking vitamin and mineral  supplements as recommended by your health care provider. What happens during an annual well check? The services and screenings done by your health care provider during your annual well check will depend on your age, overall health, lifestyle risk factors, and family history of disease. Counseling  Your health care provider may ask you questions about your: Alcohol use. Tobacco use. Drug use. Emotional well-being. Home and relationship well-being. Sexual activity. Eating habits. History of falls. Memory and ability to understand (cognition). Work and work Astronomer. Reproductive health. Screening  You may have the following tests or measurements: Height, weight, and BMI. Blood pressure. Lipid and cholesterol levels. These may be checked every 5 years, or more frequently if you are over 66 years old. Skin check. Lung cancer screening. You may have this screening every year starting at age 19 if you have a 30-pack-year history of smoking and currently smoke or have quit within the past 15 years. Fecal occult blood test (FOBT) of the stool. You may have this test every year starting at age 3. Flexible sigmoidoscopy or colonoscopy. You may have a sigmoidoscopy every 5 years or a colonoscopy every 10 years starting at age 59. Hepatitis C blood test. Hepatitis B blood test. Sexually transmitted disease (STD) testing. Diabetes screening. This is done by checking your blood sugar (glucose) after you have not eaten for a while (fasting). You may have this done every 1-3 years. Bone density scan. This is done to screen for osteoporosis. You may have this done starting at age 48. Mammogram. This may be  done every 1-2 years. Talk to your health care provider about how often you should have regular mammograms. Talk with your health care provider about your test results, treatment options, and if necessary, the need for more tests. Vaccines  Your health care provider may recommend certain  vaccines, such as: Influenza vaccine. This is recommended every year. Tetanus, diphtheria, and acellular pertussis (Tdap, Td) vaccine. You may need a Td booster every 10 years. Zoster vaccine. You may need this after age 4. Pneumococcal 13-valent conjugate (PCV13) vaccine. One dose is recommended after age 22. Pneumococcal polysaccharide (PPSV23) vaccine. One dose is recommended after age 63. Talk to your health care provider about which screenings and vaccines you need and how often you need them. This information is not intended to replace advice given to you by your health care provider. Make sure you discuss any questions you have with your health care provider. Document Released: 02/11/2015 Document Revised: 10/05/2015 Document Reviewed: 11/16/2014 Elsevier Interactive Patient Education  2017 [employer].  Fall Prevention in the Home Falls can cause injuries. They can happen to people of all ages. There are many things you can do to make your home safe and to help prevent falls. What can I do on the outside of my home? Regularly fix the edges of walkways and driveways and fix any cracks. Remove anything that might make you trip as you walk through a door, such as a raised step or threshold. Trim any bushes or trees on the path to your home. Use bright outdoor lighting. Clear any walking paths of anything that might make someone trip, such as rocks or tools. Regularly check to see if handrails are loose or broken. Make sure that both sides of any steps have handrails. Any raised decks and porches should have guardrails on the edges. Have any leaves, snow, or ice cleared regularly. Use sand or salt on walking paths during winter. Clean up any spills in your garage right away. This includes oil or grease spills. What can I do in the bathroom? Use night lights. Install grab bars by the toilet and in the tub and shower. Do not use towel bars as grab bars. Use non-skid mats or decals in  the tub or shower. If you need to sit down in the shower, use a plastic, non-slip stool. Keep the floor dry. Clean up any water that spills on the floor as soon as it happens. Remove soap buildup in the tub or shower regularly. Attach bath mats securely with double-sided non-slip rug tape. Do not have throw rugs and other things on the floor that can make you trip. What can I do in the bedroom? Use night lights. Make sure that you have a light by your bed that is easy to reach. Do not use any sheets or blankets that are too big for your bed. They should not hang down onto the floor. Have a firm chair that has side arms. You can use this for support while you get dressed. Do not have throw rugs and other things on the floor that can make you trip. What can I do in the kitchen? Clean up any spills right away. Avoid walking on wet floors. Keep items that you use a lot in easy-to-reach places. If you need to reach something above you, use a strong step stool that has a grab bar. Keep electrical cords out of the way. Do not use floor polish or wax that makes floors slippery. If you must use wax,  use non-skid floor wax. Do not have throw rugs and other things on the floor that can make you trip. What can I do with my stairs? Do not leave any items on the stairs. Make sure that there are handrails on both sides of the stairs and use them. Fix handrails that are broken or loose. Make sure that handrails are as long as the stairways. Check any carpeting to make sure that it is firmly attached to the stairs. Fix any carpet that is loose or worn. Avoid having throw rugs at the top or bottom of the stairs. If you do have throw rugs, attach them to the floor with carpet tape. Make sure that you have a light switch at the top of the stairs and the bottom of the stairs. If you do not have them, ask someone to add them for you. What else can I do to help prevent falls? Wear shoes that: Do not have high  heels. Have rubber bottoms. Are comfortable and fit you well. Are closed at the toe. Do not wear sandals. If you use a stepladder: Make sure that it is fully opened. Do not climb a closed stepladder. Make sure that both sides of the stepladder are locked into place. Ask someone to hold it for you, if possible. Clearly mark and make sure that you can see: Any grab bars or handrails. First and last steps. Where the edge of each step is. Use tools that help you move around (mobility aids) if they are needed. These include: Canes. Walkers. Scooters. Crutches. Turn on the lights when you go into a dark area. Replace any light bulbs as soon as they burn out. Set up your furniture so you have a clear path. Avoid moving your furniture around. If any of your floors are uneven, fix them. If there are any pets around you, be aware of where they are. Review your medicines with your doctor. Some medicines can make you feel dizzy. This can increase your chance of falling. Ask your doctor what other things that you can do to help prevent falls. This information is not intended to replace advice given to you by your health care provider. Make sure you discuss any questions you have with your health care provider. Document Released: 11/11/2008 Document Revised: 06/23/2015 Document Reviewed: 02/19/2014 Elsevier Interactive Patient Education  2017 [employer].

## 2022-08-29 ENCOUNTER — Telehealth: Payer: Self-pay | Admitting: Internal Medicine

## 2022-08-29 ENCOUNTER — Other Ambulatory Visit: Payer: Self-pay

## 2022-08-29 ENCOUNTER — Telehealth: Payer: Self-pay

## 2022-08-29 ENCOUNTER — Telehealth: Payer: Self-pay | Admitting: Oncology

## 2022-08-29 ENCOUNTER — Inpatient Hospital Stay: Payer: Medicare Other | Admitting: Hematology

## 2022-08-29 VITALS — BP 131/81 | HR 100 | Temp 98.2°F | Resp 20 | Ht 62.0 in | Wt 194.8 lb

## 2022-08-29 DIAGNOSIS — Z803 Family history of malignant neoplasm of breast: Secondary | ICD-10-CM | POA: Insufficient documentation

## 2022-08-29 DIAGNOSIS — K660 Peritoneal adhesions (postprocedural) (postinfection): Secondary | ICD-10-CM

## 2022-08-29 DIAGNOSIS — Z90710 Acquired absence of both cervix and uterus: Secondary | ICD-10-CM | POA: Insufficient documentation

## 2022-08-29 DIAGNOSIS — I824Z9 Acute embolism and thrombosis of unspecified deep veins of unspecified distal lower extremity: Secondary | ICD-10-CM | POA: Diagnosis not present

## 2022-08-29 DIAGNOSIS — R188 Other ascites: Secondary | ICD-10-CM | POA: Insufficient documentation

## 2022-08-29 DIAGNOSIS — R933 Abnormal findings on diagnostic imaging of other parts of digestive tract: Secondary | ICD-10-CM

## 2022-08-29 DIAGNOSIS — K669 Disorder of peritoneum, unspecified: Secondary | ICD-10-CM | POA: Diagnosis not present

## 2022-08-29 DIAGNOSIS — Z7901 Long term (current) use of anticoagulants: Secondary | ICD-10-CM | POA: Diagnosis not present

## 2022-08-29 DIAGNOSIS — Z8042 Family history of malignant neoplasm of prostate: Secondary | ICD-10-CM | POA: Diagnosis not present

## 2022-08-29 DIAGNOSIS — C786 Secondary malignant neoplasm of retroperitoneum and peritoneum: Secondary | ICD-10-CM

## 2022-08-29 DIAGNOSIS — Z800 Family history of malignant neoplasm of digestive organs: Secondary | ICD-10-CM | POA: Insufficient documentation

## 2022-08-29 DIAGNOSIS — R110 Nausea: Secondary | ICD-10-CM | POA: Insufficient documentation

## 2022-08-29 DIAGNOSIS — Z808 Family history of malignant neoplasm of other organs or systems: Secondary | ICD-10-CM | POA: Insufficient documentation

## 2022-08-29 MED ORDER — NA SULFATE-K SULFATE-MG SULF 17.5-3.13-1.6 GM/177ML PO SOLN: 1.0000 | Freq: Once | ORAL | 0 refills | Status: AC

## 2022-08-29 MED ORDER — PROCHLORPERAZINE MALEATE 10 MG PO TABS: 10.0000 mg | ORAL_TABLET | Freq: Four times a day (QID) | ORAL | 3 refills | Status: DC | PRN

## 2022-08-29 NOTE — Telephone Encounter (Signed)
Called Rachel Carlson and scheduled her for a new patient appointment with Dr. Pricilla Holm on 08/31/22 at 9:45.  Advised Rachel Carlson we will try to arrange for paracentesis after her visit with Dr. Pricilla Holm and will call her back with the appointment time. She verbalized understanding and agreement.

## 2022-08-29 NOTE — Telephone Encounter (Signed)
Rachel Carlson 10/25/1945 [id number]  Dear Dr. Milinda Antis:  We have scheduled the above named patient for a colonoscopy. Our records show that she is on anticoagulation therapy.  Please advise as to whether the patient may come off their therapy of Eliquis 2 days prior to their procedure which is scheduled for Friday, 09/14/22.  Please route your response to Juanda Crumble, RN or fax response to [phone].  Sincerely,    [facility]

## 2022-08-29 NOTE — Progress Notes (Addendum)
[facility] [address] [place] [address]   Clinic Day:  08/29/2022  Referring physician: Tower, Audrie Gallus, MD  Patient Care Team: Tower, Audrie Gallus, MD as PCP - General Doreatha Massed, MD as Medical Oncologist (Medical Oncology) Therese Sarah, RN as Oncology Nurse Navigator (Medical Oncology)   ASSESSMENT & PLAN:   Assessment:  1.  Peritoneal carcinomatosis/ovarian cancer: - Presentation: 108-month history of abdominal distention and bloating. - Last colonoscopy (01/01/2020): No evidence of malignancy. - CTAP (08/23/2022): Moderate ascites, probable omental caking in the left lower quadrant, possible small peritoneal implants seen in the pelvis.  Diverticulosis of descending and sigmoid colon with moderate wall thickening of the distal sigmoid colon.  Small bilateral pleural effusions. - 08/26/2022: CA125: 7356, CEA: 1 - Germline mutation testing (09/16/2017): Negative.  2. Social/Family History: -She lives at home with husband and is his primary caregiver. She is independent ADLS and iADLS. Retired International aid/development worker. No tobacco use.  -She had skin squamous cell carcinoma. Her brother had colon cancer at 42 and follicular non-Hodgkin's lymphoma and DLBCL, her maternal grandfather died of prostate cancer, maternal first cousin had colon cancer, and maternal aunt died of lung cancer (smoker).   Plan:  1.  Peritoneal carcinomatosis/ovarian cancer: - We have reviewed images of the CT scan and labs in detail. - Recommend CT chest to complete workup.  She had previous germline mutation testing in 2019 which was negative. - Recommend abdominal paracentesis with cytology. - Will make referral to Dr. Pricilla Holm (GYN oncology). - Recommend port placement.  If peritoneal carcinomatosis/ovarian cancer confirmed by biopsy, may consider neoadjuvant chemotherapy followed by debulking surgery.  2.  Intermittent nausea: - Will start her on Compazine 5 mg every 6 hours as  needed.  3.  Left leg distal DVT: - Doppler on 07/24/2022: Acute DVT involving left gastrocnemius veins.  Multiple thrombosed varicose veins noted in the calf. - Continue Eliquis 5 mg twice daily.   Orders Placed This Encounter  Procedures   US Paracentesis    Standing Status:   Future    Standing Expiration Date:   08/29/2023    Order Specific Question:   If therapeutic, is there a maximum amount of fluid to be removed?    Answer:   No    Order Specific Question:   Are labs required for specimen collection?    Answer:   Yes    Order Specific Question:   Lab orders requested (DO NOT place separate lab orders, these will be automatically ordered during procedure specimen collection):    Answer:   Cytology - Non Pap    Order Specific Question:   Is Albumin medication needed?    Answer:   No    Order Specific Question:   Reason for Exam (SYMPTOM  OR DIAGNOSIS REQUIRED)    Answer:   malignant ascites    Order Specific Question:   Preferred imaging location?    Answer:   [facility]    Order Specific Question:   Release to patient    Answer:   Immediate   IR IMAGING GUIDED PORT INSERTION    Standing Status:   Future    Standing Expiration Date:   08/29/2023    Order Specific Question:   Reason for Exam (SYMPTOM  OR DIAGNOSIS REQUIRED)    Answer:   chemotherapy adminstration    Order Specific Question:   Preferred Imaging Location?    Answer:   [facility]  Order Specific Question:   Release to patient    Answer:   Immediate   CT Chest W Contrast    Standing Status:   Future    Standing Expiration Date:   08/29/2023    Order Specific Question:   If indicated for the ordered procedure, I authorize the administration of contrast media per Radiology protocol    Answer:   Yes    Order Specific Question:   Does the patient have a contrast media/X-ray dye allergy?    Answer:   Yes    Order Specific Question:   Preferred imaging location?    Answer:   [facility]     Order Specific Question:   Release to patient    Answer:   Immediate [1]      I,[name] R Teague,acting as a scribe for Doreatha Massed, MD.,have documented all relevant documentation on the behalf of Doreatha Massed, MD,as directed by  Doreatha Massed, MD while in the presence of Doreatha Massed, MD.   I, Doreatha Massed MD, have reviewed the above documentation for accuracy and completeness, and I agree with the above.   Doreatha Massed, MD   7/31/20245:25 PM  CHIEF COMPLAINT/PURPOSE OF CONSULT:   Diagnosis: Peritoneal carcinomatosis/ovarian cancer   Cancer Staging  No matching staging information was found for the patient.    Prior Therapy: None  Current Therapy: Under workup   HISTORY OF PRESENT ILLNESS:   Oncology History   No history exists.      Rachel Carlson is a 78 y.o. female presenting to clinic today for evaluation of malignant ascites at the request of Tower, Audrie Gallus, MD.  She was seen in the ED on 7/28 for abdominal distention. Urinalysis found small amounts of hgb in the urine, ketones at 5, protein at 30, and non squamous epithelial at 0-5. CA 125 panel was elevated at 7,356.0. CEA was WNL. CT A/P found: moderate ascites, probable omental caking on the LLQ, possible small peritoneal implants seen in the pelvis suggesting peritoneal carcinomatosis, diverticulosis of descending and sigmoid colon, moderate wall thickening of distal sigmoid colon, small bilateral pleural effusions with adjacent subsegmental atelectasis, and a moderate size hiatal hernia. She was discharged in stable condition the same day.  Today, she states that she is doing well overall. Her appetite level is at 50%. Her energy level is at 25%. She is the primary caregiver of her husband and depends on her son for rides to appointments.   She c/o abdominal distention bloating, and discomfort for the last 2 months. She denies weight loss and has gained around 7 pounds in the  last 2-3 days. She believes she pulls a muscle while attempting to sit in a chair and is uncomfortable when sitting. Her symptoms have not improved since onset. She has a colonoscopy scheduled for 09/14/22. She had genetic testing done regarding colon cancer on 09/16/17 that came back negative.  She had pain in the left lower leg and had a venous US that found a DVT in the lower leg. She was put on Eliquis 1 month ago. She used to take a fluid pill that is not Eliquis, but recently discontinued it due to it causing abdominal cramping and nausea. She denies ever having heart attacks, strokes, tingling, numbness, or neuropathy. She c/o nausea that she treats with Benadryl and diphenhydramine, though she notes they give her drowsiness. She denies vomiting.   She had a total hysterectomy for multiple prolapses, though she notes she still has her ovaries.  PAST MEDICAL HISTORY:   Past Medical History: Past Medical History:  Diagnosis Date   Blood transfusion without reported diagnosis    Chronic right ear pain    normal MRI   Esophageal stricture    Family history of breast cancer    Family history of colon cancer    Family history of melanoma    Family history of prostate cancer    Fibroids    uterine, history of   GERD (gastroesophageal reflux disease)    History of uterine prolapse    Hx of adenomatous polyp of colon 10/19/2014   Hyperlipidemia    Obesity    PVC (premature ventricular contraction)    first dx by holter in 1980's, echo (6/10) EF 55-60%, normal diastolic fxn, normal size RV and fxn, mild MR, PASP   Squamous cell carcinoma of arm    Tubal pregnancy    Ulcerative proctosigmoiditis (HCC)    Urge urinary incontinence 06/03/2007   Qualifier: Diagnosis of  By: Lindwood Qua CMA, Jerl Santos      Surgical History: Past Surgical History:  Procedure Laterality Date   BLADDER NECK SUSPENSION  1998   BREAST BIOPSY Left    CATARACT EXTRACTION Right 01/2020   CATARACT  EXTRACTION W/PHACO Left 08/12/2020   Procedure: CATARACT EXTRACTION PHACO AND INTRAOCULAR LENS PLACEMENT LEFT EYE;  Surgeon: Fabio Pierce, MD;  Location: [facility];  Service: Ophthalmology;  Laterality: Left;  left CDE=10.71   COLONOSCOPY  05/18/2011/10/12/14   Dr. Stan Head   ECTOPIC PREGNANCY SURGERY  1980   ESOPHAGOGASTRODUODENOSCOPY (EGD) WITH ESOPHAGEAL DILATION  04/03/2012   Dr. Stan Head   PARTIAL HYSTERECTOMY     abdominal, fibroids, and prolapse (1998) bladder tack   SKIN CANCER EXCISION     UPPER GASTROINTESTINAL ENDOSCOPY      Social History: Social History   Socioeconomic History   Marital status: Married    Spouse name: Not on file   Number of children: 3   Years of education: Not on file   Highest education level: Not on file  Occupational History   Occupation: International aid/development worker    Comment:      Employer: unemployed  Tobacco Use   Smoking status: Never   Smokeless tobacco: Never  Vaping Use   Vaping status: Never Used  Substance and Sexual Activity   Alcohol use: No    Alcohol/week: 0.0 standard drinks of alcohol    Comment: rarely beer   Drug use: No   Sexual activity: Not Currently  Other Topics Concern   Not on file  Social History Narrative   Retired International aid/development worker, 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)   Grown children   Never smoker no alcohol tobacco or drug use   Social Determinants of Corporate investment banker Strain: Low Risk  (08/28/2022)   Overall Financial Resource Strain (CARDIA)    Difficulty of Paying Living Expenses: Not hard at all  Food Insecurity: No Food Insecurity (08/28/2022)   Hunger Vital Sign    Worried About Running Out of Food in the Last Year: Never true    Ran Out of Food in the Last Year: Never true  Transportation Needs: No Transportation Needs (08/28/2022)   PRAPARE - Administrator, Civil Service (Medical): No    Lack of Transportation (Non-Medical): No  Physical Activity: Inactive (08/28/2022)    Exercise Vital Sign    Days of Exercise per Week: 0 days    Minutes of Exercise per Session: 0  min  Stress: No Stress Concern Present (08/28/2022)   [employer] of Occupational Health - Occupational Stress Questionnaire    Feeling of Stress : Not at all  Social Connections: Moderately Integrated (08/28/2022)   Social Connection and Isolation Panel [NHANES]    Frequency of Communication with Friends and Family: More than three times a week    Frequency of Social Gatherings with Friends and Family: More than three times a week    Attends Religious Services: Never    Database administrator or Organizations: Yes    Attends Engineer, structural: More than 4 times per year    Marital Status: Married  Catering manager Violence: Not At Risk (08/28/2022)   Humiliation, Afraid, Rape, and Kick questionnaire    Fear of Current or Ex-Partner: No    Emotionally Abused: No    Physically Abused: No    Sexually Abused: No    Family History: Family History  Problem Relation Age of Onset   Sudden death Father 13       "coronary arteriosclerosis" on death certificate   Heart attack Mother 36   Transient ischemic attack Mother    Diabetes type II Mother    Sudden death Mother        died age 30   Diabetes Mother    Coronary artery disease Mother    Skin cancer Mother    Barrett's esophagus Mother    Arrhythmia Brother    Colon cancer Brother 32   Breast cancer Sister        dx in her 63s   Colon polyps Sister        adenomatous   Colon cancer Son 37       lynch syndrome   Melanoma Maternal Uncle    Congestive Heart Failure Maternal Grandmother    Prostate cancer Maternal Grandfather        dx in 37s   Tuberculosis Paternal Grandfather    Skin cancer Maternal Aunt    Barrett's esophagus Maternal Aunt    Other Son        parotid gland tumor   Ehlers-Danlos syndrome Daughter    Colon cancer Cousin        mid 72s; maternal cousin   Colon cancer Other        MGMs brother    Esophageal cancer Neg Hx    Stomach cancer Neg Hx    Rectal cancer Neg Hx     Current Medications:  Current Outpatient Medications:    apixaban (ELIQUIS) 5 MG TABS tablet, Take 1 tablet (5 mg total) by mouth 2 (two) times daily., Disp: 60 tablet, Rfl: 2   Ascorbic Acid (VITAMIN C) 1000 MG tablet, Take 1,000 mg by mouth daily., Disp: , Rfl:    calcium citrate-vitamin D (CITRACAL+D) 315-200 MG-UNIT per tablet, Take 1 tablet by mouth 2 (two) times daily., Disp: , Rfl:    Cholecalciferol (VITAMIN D-3) 5000 UNITS TABS, Take 5,000 Units by mouth daily., Disp: , Rfl:    Coenzyme Q10 (CO Q-10) 200 MG CAPS, Take 200 mg by mouth daily., Disp: , Rfl:    CRANBERRY PO, Take 650 mg by mouth daily., Disp: , Rfl:    estradiol (ESTRACE) 0.1 MG/GM vaginal cream, Place 0.5 g vaginally 2 (two) times a week., Disp: 42.5 g, Rfl: 11   Glucosamine-Chondroitin (GLUCOSAMINE CHONDR COMPLEX PO), Take 2 capsules by mouth daily. , Disp: , Rfl:    GRAPE SEED EXTRACT PO, Take 1 capsule by mouth  daily., Disp: , Rfl:    hydrocortisone (ANUSOL-HC) 2.5 % rectal cream, Place 1 Application rectally 2 (two) times daily., Disp: 30 g, Rfl: 1   hydrocortisone (CORTENEMA) 100 MG/60ML enema, Use 60ml every night for 28 days, Disp: 1680 mL, Rfl: 0   ketoconazole (NIZORAL) 2 % cream, Apply 1 Application topically daily. To yeast rash, Disp: 30 g, Rfl: 1   lansoprazole (PREVACID) 15 MG capsule, Take 1 capsule (15 mg total) by mouth 2 (two) times daily before a meal. (Patient taking differently: Take 15 mg by mouth daily. Uses over the counter brand), Disp: , Rfl:    LIALDA 1.2 g EC tablet, Take 2 tablets by mouth twice daily, Disp: 120 tablet, Rfl: 2   lidocaine-prilocaine (EMLA) cream, Use on vulva/ urethra as needed for pain., Disp: 30 g, Rfl: 1   meclizine (ANTIVERT) 25 MG tablet, Take 1 tablet (25 mg total) by mouth 3 (three) times daily as needed for dizziness or nausea (vertigo)., Disp: 20 tablet, Rfl: 0   milk thistle 175 MG  tablet, Take 175 mg by mouth daily., Disp: , Rfl:    Na Sulfate-K Sulfate-Mg Sulf 17.5-3.13-1.6 GM/177ML SOLN, Take 1 kit by mouth once for 1 dose., Disp: 354 mL, Rfl: 0   Omega-3 Fatty Acids (FISH OIL TRIPLE STRENGTH PO), Take 1 capsule by mouth daily., Disp: , Rfl:    Polyethyl Glycol-Propyl Glycol (SYSTANE OP), Place 1 drop into both eyes daily., Disp: , Rfl:    prochlorperazine (COMPAZINE) 10 MG tablet, Take 1 tablet (10 mg total) by mouth every 6 (six) hours as needed for nausea or vomiting., Disp: 90 tablet, Rfl: 3   rosuvastatin (CRESTOR) 20 MG tablet, Take 1 tablet (20 mg total) by mouth at bedtime., Disp: 90 tablet, Rfl: 3   vitamin B-12 (CYANOCOBALAMIN) 1000 MCG tablet, Take 1,000 mcg by mouth daily., Disp: , Rfl:    zinc gluconate 50 MG tablet, Take 50 mg by mouth daily., Disp: , Rfl:    Allergies: Allergies  Allergen Reactions   Pantoprazole Sodium Diarrhea    Abdominal pain   Ranitidine Diarrhea    abdominal pain   Omeprazole Diarrhea    Abdominal pain   Sulfasalazine Rash    Fever, chills, headache, muscle pain    REVIEW OF SYSTEMS:   Review of Systems  Constitutional:  Negative for chills, fatigue and fever.  HENT:   Negative for lump/mass, mouth sores, nosebleeds, sore throat and trouble swallowing.   Eyes:  Negative for eye problems.  Respiratory:  Positive for cough (occasional) and shortness of breath.   Cardiovascular:  Negative for chest pain, leg swelling and palpitations.  Gastrointestinal:  Positive for abdominal pain (8/10 severity), diarrhea and nausea. Negative for constipation and vomiting.  Genitourinary:  Negative for bladder incontinence, difficulty urinating, dysuria, frequency, hematuria and nocturia.        +Oliguria  Musculoskeletal:  Negative for arthralgias, back pain, flank pain, myalgias and neck pain.  Skin:  Negative for itching and rash.  Neurological:  Negative for dizziness, headaches and numbness.  Hematological:  Does not bruise/bleed  easily.  Psychiatric/Behavioral:  Negative for depression, sleep disturbance and suicidal ideas. The patient is not nervous/anxious.   All other systems reviewed and are negative.    VITALS:   Blood pressure 131/81, pulse 100, temperature 98.2 F (36.8 C), resp. rate 20, height 5\' 2"  (1.575 m), weight 194 lb 12.8 oz (88.4 kg), last menstrual period 05/29/1996, SpO2 92%.  Wt Readings from Last 3 Encounters:  08/29/22 194 lb 12.8 oz (88.4 kg)  08/28/22 190 lb (86.2 kg)  08/26/22 190 lb 0.6 oz (86.2 kg)    Body mass index is 35.63 kg/m.  Performance status (ECOG): 1 - Symptomatic but completely ambulatory  PHYSICAL EXAM:   Physical Exam Vitals and nursing note reviewed. Exam conducted with a chaperone present.  Constitutional:      Appearance: Normal appearance.  Cardiovascular:     Rate and Rhythm: Normal rate and regular rhythm.     Pulses: Normal pulses.     Heart sounds: Normal heart sounds.  Pulmonary:     Effort: Pulmonary effort is normal.     Breath sounds: Normal breath sounds.  Abdominal:     General: There is distension.     Palpations: Abdomen is soft. There is no hepatomegaly, splenomegaly or mass.     Tenderness: There is no abdominal tenderness.     Comments: No palpable organs  Musculoskeletal:     Right lower leg: No edema.     Left lower leg: No edema.  Lymphadenopathy:     Cervical: No cervical adenopathy.     Right cervical: No superficial, deep or posterior cervical adenopathy.    Left cervical: No superficial, deep or posterior cervical adenopathy.     Upper Body:     Right upper body: No supraclavicular or axillary adenopathy.     Left upper body: No supraclavicular or axillary adenopathy.  Neurological:     General: No focal deficit present.     Mental Status: She is alert and oriented to person, place, and time.  Psychiatric:        Mood and Affect: Mood normal.        Behavior: Behavior normal.     LABS:      Latest Ref Rng & Units  08/26/2022   12:52 PM 08/13/2022    8:43 AM 05/15/2022   11:25 AM  CBC  WBC 4.0 - 10.5 K/uL 6.9  6.0  6.1   Hemoglobin 12.0 - 15.0 g/dL 30.1  60.1  09.3   Hematocrit 36.0 - 46.0 % 39.1  39.3  39.9   Platelets 150 - 400 K/uL 350  311.0  246.0       Latest Ref Rng & Units 08/26/2022   12:52 PM 08/13/2022    8:43 AM 08/01/2022   10:58 AM  CMP  Glucose 70 - 99 mg/dL 93  98  99   BUN 8 - 23 mg/dL 8  10  16    Creatinine 0.44 - 1.00 mg/dL 2.35  5.73  2.20   Sodium 135 - 145 mmol/L 133  138  135   Potassium 3.5 - 5.1 mmol/L 4.0  4.5  4.1   Chloride 98 - 111 mmol/L 102  104  102   CO2 22 - 32 mmol/L 27  25  25    Calcium 8.9 - 10.3 mg/dL 8.8  9.9  25.4   Total Protein 6.5 - 8.1 g/dL 6.6  6.5    Total Bilirubin 0.3 - 1.2 mg/dL 0.9  0.8    Alkaline Phos 38 - 126 U/L 58  63    AST 15 - 41 U/L 18  16    ALT 0 - 44 U/L 12  8       Lab Results  Component Value Date   CEA1 1.0 08/26/2022   /  CEA  Date Value Ref Range Status  08/26/2022 1.0 0.0 - 4.7 ng/mL Final    Comment:    (  NOTE)                             Nonsmokers          <3.9                             Smokers             <5.6 Roche [employer]ostics Electrochemiluminescence Immunoassay (ECLIA) Values obtained with different assay methods or kits cannot be used interchangeably.  Results cannot be interpreted as absolute evidence of the presence or absence of malignant disease. Performed At: [facility] [address] [place], [place] [id number] Jolene Schimke MD EX:[id number]    No results found for: "PSA1" No results found for: "CAN199" Lab Results  Component Value Date   NUU725 7,356.0 (H) 08/26/2022    No results found for: "TOTALPROTELP", "ALBUMINELP", "A1GS", "A2GS", "BETS", "BETA2SER", "GAMS", "MSPIKE", "SPEI" No results found for: "TIBC", "FERRITIN", "IRONPCTSAT" No results found for: "LDH"   STUDIES:   CT ABDOMEN PELVIS W CONTRAST  Result Date: 08/26/2022 CLINICAL DATA:  Abdominal distension. EXAM:  CT ABDOMEN AND PELVIS WITH CONTRAST TECHNIQUE: Multidetector CT imaging of the abdomen and pelvis was performed using the standard protocol following bolus administration of intravenous contrast. RADIATION DOSE REDUCTION: This exam was performed according to the departmental dose-optimization program which includes automated exposure control, adjustment of the mA and/or kV according to patient size and/or use of iterative reconstruction technique. CONTRAST:  OMNIPAQUE IOHEXOL 300 MG/ML  SOLN COMPARISON:  August 12, 2014. FINDINGS: Lower chest: Small bilateral pleural effusions are noted with minimal adjacent subsegmental atelectasis. Moderate size sliding-type hiatal hernia is noted. Hepatobiliary: No focal liver abnormality is seen. No gallstones, gallbladder wall thickening, or biliary dilatation. Pancreas: Unremarkable. No pancreatic ductal dilatation or surrounding inflammatory changes. Spleen: Normal in size without focal abnormality. Adrenals/Urinary Tract: Adrenal glands are unremarkable. Kidneys are normal, without renal calculi, focal lesion, or hydronephrosis. Bladder is unremarkable. Stomach/Bowel: There is no evidence of bowel obstruction. The appendix is unremarkable. Diverticulosis of descending and sigmoid colon is noted. Moderate wall thickening of distal sigmoid colon is noted which may represent sequela of prior diverticulitis, but acute inflammation or possibly mass cannot be excluded. Vascular/Lymphatic: Aortic atherosclerosis. No enlarged abdominal or pelvic lymph nodes. Reproductive: Status post hysterectomy. No adnexal masses. Other: Moderate ascites is now noted. Probable omental caking is seen in the left lower quadrant concerning for peritoneal carcinomatosis. Small nodular densities are noted peripherally in the peritoneum in the pelvis suggesting possible peritoneal implants. Musculoskeletal: No acute or significant osseous findings. IMPRESSION: Moderate ascites is now noted. Probable  omental caking is noted left lower quadrant. There also are noted possible small peritoneal implants seen in the pelvis suggesting peritoneal carcinomatosis. Diverticulosis of descending and sigmoid colon is noted. Moderate wall thickening of distal sigmoid colon is noted which may represent sequela prior diverticulitis, but acute inflammation or possible underlying neoplasm or malignancy cannot be excluded. Colonoscopy is recommended. Small bilateral pleural effusions with adjacent subsegmental atelectasis. Moderate size hiatal hernia. Aortic Atherosclerosis (ICD10-I70.0). Electronically Signed   By: Lupita Raider M.D.   On: 08/26/2022 15:31   MM 3D SCREEN BREAST BILATERAL  Result Date: 08/10/2022 CLINICAL DATA:  Screening. EXAM: DIGITAL SCREENING BILATERAL MAMMOGRAM WITH TOMOSYNTHESIS AND CAD TECHNIQUE: Bilateral screening digital craniocaudal and mediolateral oblique mammograms were obtained. Bilateral screening digital breast tomosynthesis was performed. The images were evaluated  with computer-aided detection. COMPARISON:  Previous exam(s). ACR Breast Density Category b: There are scattered areas of fibroglandular density. FINDINGS: There are no findings suspicious for malignancy. IMPRESSION: No mammographic evidence of malignancy. A result letter of this screening mammogram will be mailed directly to the patient. RECOMMENDATION: Screening mammogram in one year. (Code:SM-B-01Y) BI-RADS CATEGORY  1: Negative. Electronically Signed   By: Baird Lyons M.D.   On: 08/10/2022 12:49

## 2022-08-29 NOTE — Telephone Encounter (Signed)
Called [facility] to see if colonoscopy appointment can be moved up sooner.  8/16 is the first available but a message will be sent to the nurse to see if it can be moved sooner.

## 2022-08-29 NOTE — Telephone Encounter (Signed)
Inbound call from [name] with Dr. Everitt Amber office calling to see if there was anyway we could get patient in sooner for her colonoscopy scheduled for 8/16 at 3:00 with Dr. Leone Payor. I advised her that was the soonest that we had. [name] stated Dr. Pricilla Holm would like for patient to be seen sooner if there was another provider who could do the procedure for her? Requesting we call patient to discuss, please advise.

## 2022-08-29 NOTE — Patient Instructions (Addendum)
[facility]  Discharge Instructions  You were seen and examined today by Dr. Ellin Saba. Dr. Ellin Saba is a medical oncologist, meaning that he specializes in the treatment of cancer diagnoses. Dr. Ellin Saba discussed your past medical history, family history of cancers, and the events that led to you being here today.  You were referred to Dr. Ellin Saba by the Emergency Department based on an abnormal CT scan, which revealed thickening of the peritoneum (lining of the stomach), as well as a highly elevated CA125 lab level. 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  Dr. Ellin Saba will prescribed an as needed anti-nausea medication.  Dr. Ellin Saba will arrange for you to have a Port-A-Cath placed as well as a CT scan of your chest. Dr. Ellin Saba will also ask Dr. Pricilla Holm if we can do a Paracentesis to remove some of the excess fluid build up.  Follow-up as scheduled.  Thank you for choosing  [facility] to provide your oncology and hematology care.   To afford each patient quality time with our provider, please arrive at least 15 minutes before your scheduled appointment time. You may need to reschedule your appointment if you arrive late (10 or more minutes). Arriving late affects you and other patients whose appointments are after yours.  Also, if you miss three or more appointments without notifying the office, you may be dismissed from the clinic at the provider's discretion.    Again, thank you for choosing [facility].  Our hope is that these requests will decrease the amount of time that you wait before being seen by our physicians.   If you have a lab appointment with the Cancer Center - please note that after April 8th, all labs will be drawn in the cancer center.  You do not have to check in or register with the main entrance as you have in the past but will  complete your check-in at the cancer center.            _____________________________________________________________  Should you have questions after your visit to [facility], please contact our office at [phone] and follow the prompts.  Our office hours are 8:00 a.m. to 4:30 p.m. Monday - Thursday and 8:00 a.m. to 2:30 p.m. Friday.  Please note that voicemails left after 4:00 p.m. may not be returned until the following business day.  We are closed weekends and all major holidays.  You do have access to a nurse 24-7, just call the main number to the clinic [phone] and do not press any options, hold on the line and a nurse will answer the phone.    For prescription refill requests, have your pharmacy contact our office and allow 72 hours.    Masks are no longer required in the cancer centers. If you would like for your care team to wear a mask while they are taking care of you, please let them know. You may have one support person who is at least 75 years old accompany you for your appointments.

## 2022-08-30 ENCOUNTER — Ambulatory Visit (HOSPITAL_COMMUNITY)
Admission: RE | Admit: 2022-08-30 | Discharge: 2022-08-30 | Disposition: A | Discharge status: Home / Self Care | Payer: Medicare Other | Source: Ambulatory Visit | Attending: Hematology | Admitting: Hematology

## 2022-08-30 DIAGNOSIS — R188 Other ascites: Secondary | ICD-10-CM | POA: Insufficient documentation

## 2022-08-30 DIAGNOSIS — K660 Peritoneal adhesions (postprocedural) (postinfection): Secondary | ICD-10-CM | POA: Insufficient documentation

## 2022-08-30 DIAGNOSIS — J90 Pleural effusion, not elsewhere classified: Secondary | ICD-10-CM | POA: Diagnosis not present

## 2022-08-30 DIAGNOSIS — K449 Diaphragmatic hernia without obstruction or gangrene: Secondary | ICD-10-CM | POA: Diagnosis not present

## 2022-08-30 DIAGNOSIS — I700 Atherosclerosis of aorta: Secondary | ICD-10-CM | POA: Insufficient documentation

## 2022-08-30 MED ORDER — IOHEXOL 300 MG/ML  SOLN
75.0000 mL | Freq: Once | INTRAMUSCULAR | Status: AC | PRN
  Administered 2022-08-30: 75 mL via INTRAVENOUS

## 2022-08-30 NOTE — Telephone Encounter (Signed)
That is fine to hold eliquis 2 days prior to re start when deemed safe by GI  I checked in with her oncologist who agreed

## 2022-08-30 NOTE — Telephone Encounter (Signed)
Secure chat sent to [name], [place] & made her aware that there are no sooner openings for colon with Dr. Leone Payor, but will make him aware to review when he returns to office on Monday.

## 2022-08-30 NOTE — Telephone Encounter (Addendum)
Attempted to reach patient on her home phone. Her husband asked that I give her a call back tomorrow because she has an appt in about an hour. Will call patient tomorrow to discuss clearance.  Colonoscopy instructions updated. New copy printed and placed at 2nd floor receptionist desk for patient to pick up.

## 2022-08-30 NOTE — Telephone Encounter (Signed)
Called Rachel Carlson and advised that the US paracentesis has been rescheduled to 10:30 at [facility] tomorrow.  Also rescheduled her appointment with Dr. Pricilla Holm to 9:00. She verbalized understanding and agreement.

## 2022-08-30 NOTE — Progress Notes (Deleted)
GYNECOLOGIC ONCOLOGY NEW PATIENT CONSULTATION   Patient Name: Rachel Carlson  Patient Age: 77 y.o. Date of Service: 08/30/22 Referring Provider: Judy Pimple, MD [address] [place],  [place] [address]   Primary Care Provider: Judy Pimple, MD Consulting Provider: Eugene Garnet, MD   Assessment/Plan:  ***   She underwent CT chest imaging on 8/1 and is scheduled for paracentesis today.  She is scheduled for port placement next week.  A copy of this note was sent to the patient's referring provider.   *** minutes of total time was spent for this patient encounter, including preparation, face-to-face counseling with the patient and coordination of care, and documentation of the encounter.  Eugene Garnet, MD  Division of Gynecologic Oncology  Department of Obstetrics and Gynecology  [facility]  ___________________________________________  Chief Complaint: No chief complaint on file.   History of Present Illness:  Rachel Carlson is a 77 y.o. y.o. female who is seen in consultation at the request of Tower, Audrie Gallus, MD for an evaluation of ***  The patient initially presented to the emergency department on 7/8 with a couple months of worsening abdominal distention.  She had previously had outpatient ultrasound which showed mild ascites, further workup was negative for cirrhosis (elastography).  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  Diverticulosis of the descending colon and sigmoid colon noted with moderate wall thickening of the distal sigmoid colon, possibly sequela of prior diverticulitis versus acute inflammation or underlying malignancy.  Small bilateral pleural effusions with adjacent subsegmental atelectasis noted.  CA-125 on 7/28 was 7,356. CEA was  normal (1).  The patient saw Dr. Ellin Saba on 7/31.  Chest imaging was ordered as well as paracentesis and port placement.  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      *** She has a colonoscopy scheduled for 09/14/22. She had genetic testing done regarding colon cancer on 08/2017 that came back negative ([employer], no pathogenic variants).    PAST MEDICAL HISTORY:  Past Medical History:  Diagnosis Date   Blood transfusion without reported diagnosis    Chronic right ear pain    normal MRI   Esophageal stricture    Family history of breast cancer    Family history of colon cancer    Family history of melanoma    Family history of prostate cancer    Fibroids    uterine, history of   GERD (gastroesophageal reflux disease)    History of uterine prolapse    Hx of adenomatous polyp of colon 10/19/2014   Hyperlipidemia    Obesity    PVC (premature ventricular contraction)    first dx by holter in 1980's, echo (6/10) EF 55-60%, normal diastolic fxn, normal size RV and fxn, mild MR, PASP   Squamous cell carcinoma of arm    Tubal pregnancy    Ulcerative proctosigmoiditis (HCC)    Urge urinary incontinence 06/03/2007   Qualifier: Diagnosis of  By: Lindwood Qua CMA, Jerl Santos       PAST SURGICAL HISTORY:  Past Surgical History:  Procedure Laterality Date   BLADDER NECK SUSPENSION  1998   BREAST BIOPSY Left    CATARACT EXTRACTION Right 01/2020   CATARACT EXTRACTION W/PHACO Left 08/12/2020   Procedure:  CATARACT EXTRACTION PHACO AND INTRAOCULAR LENS PLACEMENT LEFT EYE;  Surgeon: Fabio Pierce, MD;  Location: [facility];  Service: Ophthalmology;  Laterality: Left;  left CDE=10.71   COLONOSCOPY  05/18/2011/10/12/14   Dr. Stan Head   ECTOPIC PREGNANCY SURGERY  1980   ESOPHAGOGASTRODUODENOSCOPY (EGD) WITH ESOPHAGEAL DILATION  04/03/2012   Dr. Stan Head   PARTIAL HYSTERECTOMY      abdominal, fibroids, and prolapse (1998) bladder tack   SKIN CANCER EXCISION     UPPER GASTROINTESTINAL ENDOSCOPY      OB/GYN HISTORY:  OB History  Gravida Para Term Preterm AB Living  4       1 3   SAB IAB Ectopic Multiple Live Births      1   3    # Outcome Date GA Lbr Len/2nd Weight Sex Type Anes PTL Lv  4 Gravida      Vag-Forceps     3 Gravida      Vag-Spont     2 Gravida      Vag-Spont     1 Ectopic             Patient's last menstrual period was 05/29/1996.  Age at menarche: ***  Age at menopause: *** Hx of HRT: *** Hx of STDs: *** Last pap: 07/2021 - [facility] History of abnormal pap smears: ***  SCREENING STUDIES:  Last mammogram: 2024  Last colonoscopy: 2021 Last bone mineral density: 2023  MEDICATIONS: Outpatient Encounter Medications as of 08/31/2022  Medication Sig   apixaban (ELIQUIS) 5 MG TABS tablet Take 1 tablet (5 mg total) by mouth 2 (two) times daily.   Ascorbic Acid (VITAMIN C) 1000 MG tablet Take 1,000 mg by mouth daily.   calcium citrate-vitamin D (CITRACAL+D) 315-200 MG-UNIT per tablet Take 1 tablet by mouth 2 (two) times daily.   Cholecalciferol (VITAMIN D-3) 5000 UNITS TABS Take 5,000 Units by mouth daily.   Coenzyme Q10 (CO Q-10) 200 MG CAPS Take 200 mg by mouth daily.   CRANBERRY PO Take 650 mg by mouth daily.   estradiol (ESTRACE) 0.1 MG/GM vaginal cream Place 0.5 g vaginally 2 (two) times a week.   Glucosamine-Chondroitin (GLUCOSAMINE CHONDR COMPLEX PO) Take 2 capsules by mouth daily.    GRAPE SEED EXTRACT PO Take 1 capsule by mouth daily.   hydrocortisone (ANUSOL-HC) 2.5 % rectal cream Place 1 Application rectally 2 (two) times daily.   hydrocortisone (CORTENEMA) 100 MG/60ML enema Use 60ml every night for 28 days   ketoconazole (NIZORAL) 2 % cream Apply 1 Application topically daily. To yeast rash   lansoprazole (PREVACID) 15 MG capsule Take 1 capsule (15 mg total) by mouth 2 (two) times daily before a meal. (Patient taking differently: Take 15 mg  by mouth daily. Uses over the counter brand)   LIALDA 1.2 g EC tablet Take 2 tablets by mouth twice daily   lidocaine-prilocaine (EMLA) cream Use on vulva/ urethra as needed for pain.   meclizine (ANTIVERT) 25 MG tablet Take 1 tablet (25 mg total) by mouth 3 (three) times daily as needed for dizziness or nausea (vertigo).   milk thistle 175 MG tablet Take 175 mg by mouth daily.   Omega-3 Fatty Acids (FISH OIL TRIPLE STRENGTH PO) Take 1 capsule by mouth daily.   Polyethyl Glycol-Propyl Glycol (SYSTANE OP) Place 1 drop into both eyes daily.   prochlorperazine (COMPAZINE) 10 MG tablet Take 1 tablet (10 mg total) by mouth every 6 (six) hours as needed for nausea or vomiting.   rosuvastatin (CRESTOR)  20 MG tablet Take 1 tablet (20 mg total) by mouth at bedtime.   vitamin B-12 (CYANOCOBALAMIN) 1000 MCG tablet Take 1,000 mcg by mouth daily.   zinc gluconate 50 MG tablet Take 50 mg by mouth daily.   No facility-administered encounter medications on file as of 08/31/2022.    ALLERGIES:  Allergies  Allergen Reactions   Pantoprazole Sodium Diarrhea    Abdominal pain   Ranitidine Diarrhea    abdominal pain   Omeprazole Diarrhea    Abdominal pain   Sulfasalazine Rash    Fever, chills, headache, muscle pain     FAMILY HISTORY:  Family History  Problem Relation Age of Onset   Sudden death Father 63       "coronary arteriosclerosis" on death certificate   Heart attack Mother 75   Transient ischemic attack Mother    Diabetes type II Mother    Sudden death Mother        died age 82   Diabetes Mother    Coronary artery disease Mother    Skin cancer Mother    Barrett's esophagus Mother    Arrhythmia Brother    Colon cancer Brother 57   Breast cancer Sister        dx in her 58s   Colon polyps Sister        adenomatous   Colon cancer Son 37       lynch syndrome   Melanoma Maternal Uncle    Congestive Heart Failure Maternal Grandmother    Prostate cancer Maternal Grandfather        dx in  12s   Tuberculosis Paternal Grandfather    Skin cancer Maternal Aunt    Barrett's esophagus Maternal Aunt    Other Son        parotid gland tumor   Ehlers-Danlos syndrome Daughter    Colon cancer Cousin        mid 75s; maternal cousin   Colon cancer Other        MGMs brother   Esophageal cancer Neg Hx    Stomach cancer Neg Hx    Rectal cancer Neg Hx      SOCIAL HISTORY:  Social Connections: Moderately Integrated (08/28/2022)   Social Connection and Isolation Panel [NHANES]    Frequency of Communication with Friends and Family: More than three times a week    Frequency of Social Gatherings with Friends and Family: More than three times a week    Attends Religious Services: Never    Database administrator or Organizations: Yes    Attends Engineer, structural: More than 4 times per year    Marital Status: Married    REVIEW OF SYSTEMS:  Denies appetite changes, fevers, chills, fatigue, unexplained weight changes. Denies hearing loss, neck lumps or masses, mouth sores, ringing in ears or voice changes. Denies cough or wheezing.  Denies shortness of breath. Denies chest pain or palpitations. Denies leg swelling. Denies abdominal distention, pain, blood in stools, constipation, diarrhea, nausea, vomiting, or early satiety. Denies pain with intercourse, dysuria, frequency, hematuria or incontinence. Denies hot flashes, pelvic pain, vaginal bleeding or vaginal discharge.   Denies joint pain, back pain or muscle pain/cramps. Denies itching, rash, or wounds. Denies dizziness, headaches, numbness or seizures. Denies swollen lymph nodes or glands, denies easy bruising or bleeding. Denies anxiety, depression, confusion, or decreased concentration.  Physical Exam:  Vital Signs for this encounter:  Last menstrual period 05/29/1996. There is no height or weight on file to  calculate BMI. General: Alert, oriented, no acute distress.  HEENT: Normocephalic, atraumatic. Sclera  anicteric.  Chest: Clear to auscultation bilaterally. No wheezes, rhonchi, or rales. Cardiovascular: Regular rate and rhythm, no murmurs, rubs, or gallops.  Abdomen: ***Obese. Normoactive bowel sounds. Soft, nondistended, nontender to palpation. No masses or hepatosplenomegaly appreciated. No palpable fluid wave.  Extremities: Grossly normal range of motion. Warm, well perfused. No edema bilaterally.  Skin: No rashes or lesions.  Lymphatics: No cervical, supraclavicular, or inguinal adenopathy.  GU:  Normal external female genitalia. ***  No lesions. No discharge or bleeding.             Bladder/urethra:  No lesions or masses, well supported bladder             Vagina: ***             Cervix: Normal appearing, no lesions.             Uterus: *** Small, mobile, no parametrial involvement or nodularity.             Adnexa: *** masses.  Rectal: ***  LABORATORY AND RADIOLOGIC DATA:  Outside medical records were reviewed to synthesize the above history, along with the history and physical obtained during the visit.   Lab Results  Component Value Date   WBC 6.9 08/26/2022   HGB 12.7 08/26/2022   HCT 39.1 08/26/2022   PLT 350 08/26/2022   GLUCOSE 93 08/26/2022   CHOL 152 08/13/2022   TRIG 134.0 08/13/2022   HDL 46.90 08/13/2022   LDLDIRECT 257.3 06/04/2008   LDLCALC 78 08/13/2022   ALT 12 08/26/2022   AST 18 08/26/2022   NA 133 (L) 08/26/2022   K 4.0 08/26/2022   CL 102 08/26/2022   CREATININE 0.56 08/26/2022   BUN 8 08/26/2022   CO2 27 08/26/2022   TSH 3.86 08/13/2022   INR 1.1 (H) 05/15/2022   HGBA1C 6.3 08/13/2022

## 2022-08-31 ENCOUNTER — Encounter: Payer: Self-pay | Admitting: Oncology

## 2022-08-31 ENCOUNTER — Ambulatory Visit: Payer: Medicare Other | Admitting: Gynecologic Oncology

## 2022-08-31 ENCOUNTER — Ambulatory Visit (HOSPITAL_COMMUNITY)
Admission: RE | Admit: 2022-08-31 | Discharge: 2022-08-31 | Disposition: A | Discharge status: Home / Self Care | Payer: Medicare Other | Source: Ambulatory Visit | Attending: Hematology | Admitting: Hematology

## 2022-08-31 ENCOUNTER — Other Ambulatory Visit: Payer: Self-pay

## 2022-08-31 ENCOUNTER — Ambulatory Visit (HOSPITAL_COMMUNITY): Admission: RE | Admit: 2022-08-31 | Payer: Medicare Other | Source: Ambulatory Visit

## 2022-08-31 ENCOUNTER — Inpatient Hospital Stay: Payer: Medicare Other | Attending: Gynecologic Oncology | Admitting: Gynecologic Oncology

## 2022-08-31 ENCOUNTER — Encounter: Payer: Self-pay | Admitting: Gynecologic Oncology

## 2022-08-31 VITALS — BP 110/65 | HR 112 | Temp 98.3°F | Resp 18 | Wt 194.2 lb

## 2022-08-31 DIAGNOSIS — C579 Malignant neoplasm of female genital organ, unspecified: Secondary | ICD-10-CM | POA: Insufficient documentation

## 2022-08-31 DIAGNOSIS — K219 Gastro-esophageal reflux disease without esophagitis: Secondary | ICD-10-CM | POA: Insufficient documentation

## 2022-08-31 DIAGNOSIS — K660 Peritoneal adhesions (postprocedural) (postinfection): Secondary | ICD-10-CM | POA: Diagnosis not present

## 2022-08-31 DIAGNOSIS — I82409 Acute embolism and thrombosis of unspecified deep veins of unspecified lower extremity: Secondary | ICD-10-CM | POA: Diagnosis not present

## 2022-08-31 DIAGNOSIS — K222 Esophageal obstruction: Secondary | ICD-10-CM | POA: Insufficient documentation

## 2022-08-31 DIAGNOSIS — R971 Elevated cancer antigen 125 [CA 125]: Secondary | ICD-10-CM | POA: Insufficient documentation

## 2022-08-31 DIAGNOSIS — I82402 Acute embolism and thrombosis of unspecified deep veins of left lower extremity: Secondary | ICD-10-CM

## 2022-08-31 DIAGNOSIS — R180 Malignant ascites: Secondary | ICD-10-CM

## 2022-08-31 DIAGNOSIS — Z79899 Other long term (current) drug therapy: Secondary | ICD-10-CM | POA: Insufficient documentation

## 2022-08-31 DIAGNOSIS — R188 Other ascites: Secondary | ICD-10-CM | POA: Insufficient documentation

## 2022-08-31 DIAGNOSIS — K5130 Ulcerative (chronic) rectosigmoiditis without complications: Secondary | ICD-10-CM | POA: Insufficient documentation

## 2022-08-31 DIAGNOSIS — R978 Other abnormal tumor markers: Secondary | ICD-10-CM | POA: Insufficient documentation

## 2022-08-31 DIAGNOSIS — Z800 Family history of malignant neoplasm of digestive organs: Secondary | ICD-10-CM | POA: Insufficient documentation

## 2022-08-31 DIAGNOSIS — Z8042 Family history of malignant neoplasm of prostate: Secondary | ICD-10-CM | POA: Insufficient documentation

## 2022-08-31 DIAGNOSIS — C786 Secondary malignant neoplasm of retroperitoneum and peritoneum: Secondary | ICD-10-CM | POA: Diagnosis not present

## 2022-08-31 DIAGNOSIS — Z7901 Long term (current) use of anticoagulants: Secondary | ICD-10-CM | POA: Insufficient documentation

## 2022-08-31 DIAGNOSIS — Z808 Family history of malignant neoplasm of other organs or systems: Secondary | ICD-10-CM | POA: Insufficient documentation

## 2022-08-31 DIAGNOSIS — C800 Disseminated malignant neoplasm, unspecified: Secondary | ICD-10-CM | POA: Insufficient documentation

## 2022-08-31 DIAGNOSIS — E669 Obesity, unspecified: Secondary | ICD-10-CM | POA: Insufficient documentation

## 2022-08-31 DIAGNOSIS — E785 Hyperlipidemia, unspecified: Secondary | ICD-10-CM | POA: Insufficient documentation

## 2022-08-31 MED ORDER — LIDOCAINE HCL 1 % IJ SOLN
INTRAMUSCULAR | Status: AC
  Filled 2022-08-31: qty 20

## 2022-08-31 NOTE — Progress Notes (Signed)
GYNECOLOGIC ONCOLOGY NEW PATIENT CONSULTATION   Patient Name: Rachel Carlson  Patient Age: 77 y.o. Date of Service: 08/31/22 Referring Provider: Dr. 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 Recently diagnosed LE DVT.  Symptomatic ascites.   I spent some time reviewing with the patient and her family recent symptomatology as well as her CT scan from the end of July.  We reviewed the CT images themselves and discussed findings of ascites, carcinomatosis, omental thickening.  These findings in combination with elevated CA125 support a diagnosis of metastatic gynecologic cancer.  Although CT scan does not show an obvious adnexal mass, I suspect that this may represent primary peritoneal carcinoma.  Patient has become increasingly symptomatic related to ascites and abdominal distention.  She is scheduled for a paracentesis this morning.  Fluid has been requested to be sent for cytology.  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 I discussed that the treatment approach for this disease is typically combination of cytoreductive surgery and chemotherapy. I discussed that sequencing of this can be either with upfront debulking followed by adjuvant chemotherapy sequentially or neoadjuvant chemotherapy followed by an interval cytoreductive attempt, then additional chemotherapy. This latter approach is associated with a reduced perioperative morbidity at the time of surgery.  I discussed that decisions regarding sequencing of therapy is individualized taking into account individual patient health, in addition to the apparent tumor distribution on imaging, and likelihood of complete surgical resection at the time of surgery. I discussed that the overall  survival observed in patients is equivalent for both approaches (neoadjuvant chemotherapy versus primary debulking surgery) provided that there is an optimal cytoreductive effort at the time of surgery (regardless of the timing of that surgery). Given the observed decreased morbidity of neoadjuvant chemotherapy in the setting of her recently diagnosed DVT, with preserved survival outcomes, I favor this approach for her.   The patient has met with Dr. Ellin Carlson.  She is scheduled for a port placement next week with plans to start chemotherapy subsequently.  We discussed that if cytology is not diagnostic, we will pursue omental biopsy.  Findings on CT scan at the sigmoid colon are likely to represent sequelae of her ulcerative colitis.  She is scheduled to see her gastroenterologist and have colonoscopy later this month.  Given family history, she previously underwent genetic germline testing in 2019 which was negative for any pathogenic variants.  I will reach out to our genetic counselors to see if there is any need for repeat testing given the time interval.  Will plan for genomic testing once we have tissue to send.  A copy of this note was sent to the patient's referring provider.   70 minutes of total time was spent for this patient encounter, including preparation, face-to-face counseling with the patient and coordination of care, and documentation of the encounter.  Rachel Garnet, MD  Division of Gynecologic Oncology  Department of Obstetrics and Gynecology  [facility]  ___________________________________________  Chief Complaint: No chief complaint on file.   History of Present Illness:  Rachel Carlson is a 77 y.o. y.o. female who is seen in consultation at the request of Dr. 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  The patient initially presented to the emergency department on 7/8 with a couple months of  worsening abdominal distention.  She had previously had outpatient ultrasound which showed mild ascites, further workup was negative for cirrhosis (elastography).  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  Diverticulosis of the descending colon and sigmoid colon noted with moderate wall thickening of the distal sigmoid colon, possibly sequela of prior diverticulitis versus acute inflammation or underlying malignancy.  Small bilateral pleural effusions with adjacent subsegmental atelectasis noted.  CA-125 on 7/28 was 7,356. CEA was normal (1).  The patient saw Dr. Ellin Carlson on 7/31.  Chest imaging was ordered as well as paracentesis and port placement.  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   She has a colonoscopy scheduled for 09/14/22. She had genetic testing done regarding colon cancer on 08/2017 that came back negative ([employer], no pathogenic variants).   The patient presents today with her husband and her son.  She notes having worsening symptoms starting about 2 months ago including abdominal tightness, distention, decreased appetite, intermittent nausea, and early satiety.  She has some constipation at baseline related to her ulcerative colitis, denies any recent changes to bowel function.  Has been trying to drink lots of water but is not voiding very much.  Notes some lower extremity edema.  Surgical history is notable for a total hysterectomy years ago for fibroids.  Ovaries were reportedly left at the time of the surgery.   PAST MEDICAL HISTORY:  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)     Esophageal stricture    Family history of breast cancer    Family history of colon cancer    Family history of melanoma    Family history of prostate cancer    Fibroids 1998   uterine, history of (left ovaries)   GERD (gastroesophageal reflux disease)    History of uterine prolapse    Hx of adenomatous polyp of colon 10/19/2014   Hyperlipidemia    Obesity    PVC (premature ventricular contraction)    first dx by holter in 1980's, echo (6/10) EF 55-60%, normal diastolic fxn, normal size RV and fxn, mild MR, PASP   Squamous cell carcinoma of arm    Tubal pregnancy    Ulcerative proctosigmoiditis (HCC)    Urge urinary incontinence 06/03/2007   Qualifier: Diagnosis of  By: Lindwood Qua CMA, Jerl Santos       PAST SURGICAL HISTORY:  Past Surgical History:  Procedure Laterality Date   BLADDER NECK SUSPENSION  1998   BREAST BIOPSY Left    BREAST LUMPECTOMY Left    benign   CATARACT EXTRACTION Right 01/2020   CATARACT EXTRACTION W/PHACO Left 08/12/2020   Procedure: CATARACT EXTRACTION PHACO AND INTRAOCULAR LENS PLACEMENT LEFT EYE;  Surgeon: Fabio Pierce, MD;  Location: [facility];  Service: Ophthalmology;  Laterality: Left;  left CDE=10.71   COLONOSCOPY  05/18/2011/10/12/14   Dr. Stan Head   ECTOPIC PREGNANCY SURGERY  1980   ESOPHAGOGASTRODUODENOSCOPY (EGD) WITH ESOPHAGEAL DILATION  04/03/2012   Dr. Stan Head   PARTIAL HYSTERECTOMY     abdominal, fibroids, and prolapse (1998) bladder tack   SKIN CANCER EXCISION     UPPER GASTROINTESTINAL ENDOSCOPY  OB/GYN HISTORY:  OB History  Gravida Para Term Preterm AB Living  4       1 3   SAB IAB Ectopic Multiple Live Births      1   3    # Outcome Date GA Lbr Len/2nd Weight Sex Type Anes PTL Lv  4 Gravida      Vag-Forceps     3 Gravida      Vag-Spont     2 Gravida      Vag-Spont     1 Ectopic             Patient's last menstrual period was 05/29/1996.  Age at menarche: 47 Age at menopause: Unsure, patient underwent  hysterectomy at 54 but never developed any menopausal symptoms. Hx of HRT: denies Hx of STDs: denies Last pap: 07/2021 - [facility] History of abnormal pap smears: denies  SCREENING STUDIES:  Last mammogram: 2024  Last colonoscopy: 2021 Last bone mineral density: 2023  MEDICATIONS: Outpatient Encounter Medications as of 08/31/2022  Medication Sig   apixaban (ELIQUIS) 5 MG TABS tablet Take 1 tablet (5 mg total) by mouth 2 (two) times daily.   Ascorbic Acid (VITAMIN C) 1000 MG tablet Take 1,000 mg by mouth daily.   calcium citrate-vitamin D (CITRACAL+D) 315-200 MG-UNIT per tablet Take 1 tablet by mouth 2 (two) times daily.   Cholecalciferol (VITAMIN D-3) 5000 UNITS TABS Take 5,000 Units by mouth daily.   Coenzyme Q10 (CO Q-10) 200 MG CAPS Take 200 mg by mouth daily.   CRANBERRY PO Take 650 mg by mouth daily.   estradiol (ESTRACE) 0.1 MG/GM vaginal cream Place 0.5 g vaginally 2 (two) times a week.   Glucosamine-Chondroitin (GLUCOSAMINE CHONDR COMPLEX PO) Take 2 capsules by mouth daily.    GRAPE SEED EXTRACT PO Take 1 capsule by mouth daily.   hydrocortisone (ANUSOL-HC) 2.5 % rectal cream Place 1 Application rectally 2 (two) times daily.   hydrocortisone (CORTENEMA) 100 MG/60ML enema Use 60ml every night for 28 days   ketoconazole (NIZORAL) 2 % cream Apply 1 Application topically daily. To yeast rash   lansoprazole (PREVACID) 15 MG capsule Take 1 capsule (15 mg total) by mouth 2 (two) times daily before a meal. (Patient taking differently: Take 15 mg by mouth daily. Uses over the counter brand)   LIALDA 1.2 g EC tablet Take 2 tablets by mouth twice daily   lidocaine-prilocaine (EMLA) cream Use on vulva/ urethra as needed for pain.   meclizine (ANTIVERT) 25 MG tablet Take 1 tablet (25 mg total) by mouth 3 (three) times daily as needed for dizziness or nausea (vertigo).   milk thistle 175 MG tablet Take 175 mg by mouth daily.   Omega-3 Fatty Acids (FISH OIL TRIPLE STRENGTH PO) Take 1 capsule by mouth  daily.   Polyethyl Glycol-Propyl Glycol (SYSTANE OP) Place 1 drop into both eyes daily.   prochlorperazine (COMPAZINE) 10 MG tablet Take 1 tablet (10 mg total) by mouth every 6 (six) hours as needed for nausea or vomiting.   rosuvastatin (CRESTOR) 20 MG tablet Take 1 tablet (20 mg total) by mouth at bedtime.   vitamin B-12 (CYANOCOBALAMIN) 1000 MCG tablet Take 1,000 mcg by mouth daily.   zinc gluconate 50 MG tablet Take 50 mg by mouth daily.   No facility-administered encounter medications on file as of 08/31/2022.    ALLERGIES:  Allergies  Allergen Reactions   Pantoprazole Sodium Diarrhea    Abdominal pain   Ranitidine Diarrhea    abdominal pain  Omeprazole Diarrhea    Abdominal pain   Sulfasalazine Rash    Fever, chills, headache, muscle pain     FAMILY HISTORY:  Family History  Problem Relation Age of Onset   Sudden death Father 59       "coronary arteriosclerosis" on death certificate   Heart attack Mother 67   Transient ischemic attack Mother    Diabetes type II Mother    Sudden death Mother        died age 31   Diabetes Mother    Coronary artery disease Mother    Skin cancer Mother    Barrett's esophagus Mother    Arrhythmia Brother    Colon cancer Brother 45   Breast cancer Sister        dx in her 56s   Colon polyps Sister        adenomatous   Colon cancer Son 82       lynch syndrome   Melanoma Maternal Uncle    Congestive Heart Failure Maternal Grandmother    Prostate cancer Maternal Grandfather        dx in 47s   Tuberculosis Paternal Grandfather    Skin cancer Maternal Aunt    Barrett's esophagus Maternal Aunt    Other Son        parotid gland tumor   Ehlers-Danlos syndrome Daughter    Colon cancer Cousin        mid 50s; maternal cousin   Colon cancer Other        MGMs brother   Esophageal cancer Neg Hx    Stomach cancer Neg Hx    Rectal cancer Neg Hx      SOCIAL HISTORY:  Social Connections: Moderately Integrated (08/28/2022)   Social  Connection and Isolation Panel [NHANES]    Frequency of Communication with Friends and Family: More than three times a week    Frequency of Social Gatherings with Friends and Family: More than three times a week    Attends Religious Services: Never    Database administrator or Organizations: Yes    Attends Engineer, structural: More than 4 times per year    Marital Status: Married    REVIEW OF SYSTEMS:  + appetite changes, fatigue, shortness of breath, abdominal distention, leg swelling Denies fevers, chills, unexplained weight changes. Denies hearing loss, neck lumps or masses, mouth sores, ringing in ears or voice changes. Denies cough or wheezing.   Denies chest pain or palpitations. Denies abdominal pain, blood in stools, constipation, diarrhea, nausea, vomiting, or early satiety. Denies pain with intercourse, dysuria, frequency, hematuria or incontinence. Denies hot flashes, pelvic pain, vaginal bleeding or vaginal discharge.   Denies joint pain, back pain or muscle pain/cramps. Denies itching, rash, or wounds. Denies dizziness, headaches, numbness or seizures. Denies swollen lymph nodes or glands, denies easy bruising or bleeding. Denies anxiety, depression, confusion, or decreased concentration.  Physical Exam:  Vital Signs for this encounter:  Blood pressure 110/65, pulse (!) 112, temperature 98.3 F (36.8 C), temperature source Oral, resp. rate 18, weight 194 lb 3.2 oz (88.1 kg), last menstrual period 05/29/1996, SpO2 93%. Body mass index is 35.52 kg/m. General: Alert, oriented, no acute distress.  HEENT: Normocephalic, atraumatic. Sclera anicteric.  Chest: Clear to auscultation bilaterally. No wheezes, rhonchi, or rales. Cardiovascular: Mildly tachycardic, regular rhythm, no murmurs, rubs, or gallops.  Abdomen: Obese. Normoactive bowel sounds.  Somewhat tense, distended, fluid wave, mild tenderness to palpation. No masses or hepatosplenomegaly appreciated.   Extremities:  Grossly normal range of motion. Warm, well perfused. 1+ edema bilaterally.  Skin: No rashes or lesions.  Lymphatics: No cervical, supraclavicular, or inguinal adenopathy.  GU:  Normal external female genitalia. No lesions. No discharge or bleeding.             Bladder/urethra:  No lesions or masses, well supported bladder             Vagina: Mildly atrophic, no lesions.             Cervix/uterus: surgically absent.             Adnexa: No masses appreciated.  Fluid filling the cul-de-sac.  Rectal: Deferred.  LABORATORY AND RADIOLOGIC DATA:  Outside medical records were reviewed to synthesize the above history, along with the history and physical obtained during the visit.   Lab Results  Component Value Date   WBC 6.9 08/26/2022   HGB 12.7 08/26/2022   HCT 39.1 08/26/2022   PLT 350 08/26/2022   GLUCOSE 93 08/26/2022   CHOL 152 08/13/2022   TRIG 134.0 08/13/2022   HDL 46.90 08/13/2022   LDLDIRECT 257.3 06/04/2008   LDLCALC 78 08/13/2022   ALT 12 08/26/2022   AST 18 08/26/2022   NA 133 (L) 08/26/2022   K 4.0 08/26/2022   CL 102 08/26/2022   CREATININE 0.56 08/26/2022   BUN 8 08/26/2022   CO2 27 08/26/2022   TSH 3.86 08/13/2022   INR 1.1 (H) 05/15/2022   HGBA1C 6.3 08/13/2022

## 2022-08-31 NOTE — Patient Instructions (Signed)
It was very nice to meet you today.  Based on your imaging, I suspect that you have stage IV gynecologic cancer, either coming from your ovaries or the peritoneum.  Hopefully the procedure today will give you some relief and allow you to eat more.  After we get the results back, as long as the fluid gives [place] a diagnosis, we can proceed with chemotherapy.  The goal would be to treat you with 3-4 cycles of chemotherapy followed by repeat imaging.  If imaging shows improvement in the fluid as well as the cancer deposits, then we will plan for surgery to remove all of the cancer that remains.  I will reach out to our genetic counselors to see if there is any use in repeating your genetic testing since that was done 5 years ago.  Once we have some tissue from surgery, we will send this for additional genetic testing.  I will plan to see you back for follow-up after your repeat imaging.

## 2022-08-31 NOTE — Procedures (Signed)
Ultrasound-guided diagnostic and therapeutic paracentesis performed yielding 7 liters of milky yellow colored fluid.  Fluid was sent to lab for analysis. No immediate complications. EBL is none.

## 2022-08-31 NOTE — Telephone Encounter (Signed)
PT is calling to get an update about a sooner colonoscopy. Advised her that he is out of the office and once returned we will ask his recommendations.

## 2022-09-02 NOTE — Telephone Encounter (Signed)
Looks like Dr. Barron Alvine has several openings this week so he could do it  1) We have clearance to hold Eliquis x 2 days  2) Schedule for this week if works   Encounter Diagnoses  Name Primary?   Peritoneal carcinomatosis (HCC) Yes   Abnormal CT scan, sigmoid colon

## 2022-09-03 ENCOUNTER — Other Ambulatory Visit: Payer: Self-pay

## 2022-09-03 DIAGNOSIS — C786 Secondary malignant neoplasm of retroperitoneum and peritoneum: Secondary | ICD-10-CM

## 2022-09-03 DIAGNOSIS — R933 Abnormal findings on diagnostic imaging of other parts of digestive tract: Secondary | ICD-10-CM

## 2022-09-03 NOTE — Telephone Encounter (Signed)
Pt was made aware of Dr. Darrick Grinder recommendations: Pt was scheduled with Dr. Barron Alvine on 09/07/2022 at 11:30 AM  for the Colonoscopy: Pt made aware. New prep instructions were printed for pt. Pt made aware. Pt to pick up instructions tomorrow.  A new Ambulatory referral to GI  placed in Epic.  Just FYI. Pt stated that she was to have port placed on Thursday. I just want to ensure that she is ok to proceed with the Colonoscopy of Friday as scheduled.

## 2022-09-03 NOTE — Progress Notes (Signed)
[facility] [address], [place] [address]   Clinic Day:  09/04/2022  Referring physician: Tower, Audrie Gallus, MD  Patient Care Team: Tower, Audrie Gallus, MD as PCP - General Doreatha Massed, MD as Medical Oncologist (Medical Oncology) Therese Sarah, RN as Oncology Nurse Navigator (Medical Oncology)   ASSESSMENT & PLAN:   Assessment:  1.  Peritoneal carcinomatosis/ovarian cancer: - Presentation: 39-month history of abdominal distention and bloating. - Last colonoscopy (01/01/2020): No evidence of malignancy. - CTAP (08/23/2022): Moderate ascites, probable omental caking in the left lower quadrant, possible small peritoneal implants seen in the pelvis.  Diverticulosis of descending and sigmoid colon with moderate wall thickening of the distal sigmoid colon.  Small bilateral pleural effusions. - 08/26/2022: CA125: 7356, CEA: 1 - Germline mutation testing (09/16/2017): Negative.  2. Social/Family History: -She lives at home with husband and is his primary caregiver. She is independent ADLS and iADLS. Retired International aid/development worker. No tobacco use.  -She had skin squamous cell carcinoma. Her brother had colon cancer at 6 and follicular non-Hodgkin's lymphoma and DLBCL, her maternal grandfather died of prostate cancer, maternal first cousin had colon cancer, and maternal aunt died of lung cancer (smoker).   Plan:  1.  Peritoneal carcinomatosis/ovarian cancer: - We have reviewed images of the CT scan and labs in detail. - Recommend CT chest to complete workup.  She had previous germline mutation testing in 2019 which was negative. - Recommend abdominal paracentesis with cytology. - Will make referral to Dr. Pricilla Holm (GYN oncology). - Recommend port placement.  If peritoneal carcinomatosis/ovarian cancer confirmed by biopsy, may consider neoadjuvant chemotherapy followed by debulking surgery.  2.  Intermittent nausea: - Will start her on Compazine 5 mg every 6 hours as  needed.  3.  Left leg distal DVT: - Doppler on 07/24/2022: Acute DVT involving left gastrocnemius veins.  Multiple thrombosed varicose veins noted in the calf. - Continue Eliquis 5 mg twice daily.   No orders of the defined types were placed in this encounter.     Alben Deeds Teague,acting as a Neurosurgeon for Doreatha Massed, MD.,have documented all relevant documentation on the behalf of Doreatha Massed, MD,as directed by  Doreatha Massed, MD while in the presence of Doreatha Massed, MD.  ***   [place] R Teague   8/5/20248:58 PM  CHIEF COMPLAINT/PURPOSE OF CONSULT:   Diagnosis: Peritoneal carcinomatosis/ovarian cancer   Cancer Staging  No matching staging information was found for the patient.    Prior Therapy: None  Current Therapy: Under workup   HISTORY OF PRESENT ILLNESS:   Oncology History   No history exists.      Rachel Carlson is a 77 y.o. female presenting to clinic today for evaluation of malignant ascites at the request of Tower, Audrie Gallus, MD.  She was seen in the ED on 7/28 for abdominal distention. Urinalysis found small amounts of hgb in the urine, ketones at 5, protein at 30, and non squamous epithelial at 0-5. CA 125 panel was elevated at 7,356.0. CEA was WNL. CT A/P found: moderate ascites, probable omental caking on the LLQ, possible small peritoneal implants seen in the pelvis suggesting peritoneal carcinomatosis, diverticulosis of descending and sigmoid colon, moderate wall thickening of distal sigmoid colon, small bilateral pleural effusions with adjacent subsegmental atelectasis, and a moderate size hiatal hernia. She was discharged in stable condition the same day.  Today, she states that she is doing well overall. Her appetite level is at 50%. Her energy level is at 25%.  She is the primary caregiver of her husband and depends on her son for rides to appointments.   She c/o abdominal distention bloating, and discomfort for the last 2 months. She  denies weight loss and has gained around 7 pounds in the last 2-3 days. She believes she pulls a muscle while attempting to sit in a chair and is uncomfortable when sitting. Her symptoms have not improved since onset. She has a colonoscopy scheduled for 09/14/22. She had genetic testing done regarding colon cancer on 09/16/17 that came back negative.  She had pain in the left lower leg and had a venous US that found a DVT in the lower leg. She was put on Eliquis 1 month ago. She used to take a fluid pill that is not Eliquis, but recently discontinued it due to it causing abdominal cramping and nausea. She denies ever having heart attacks, strokes, tingling, numbness, or neuropathy. She c/o nausea that she treats with Benadryl and diphenhydramine, though she notes they give her drowsiness. She denies vomiting.   She had a total hysterectomy for multiple prolapses, though she notes she still has her ovaries.   INTERVAL HISTORY:   Rachel Carlson is a 77 y.o. female presenting to the clinic today for follow-up of Peritoneal carcinomatosis/ovarian cancer. She was last seen by me on 08/29/22 in consultation.  Since her last visit, she underwent a chest CT on 8/1 that found: ***.  She had a US Paracentesis on 8/2 that yielded 7 liters of peritoneal fluid.  Of note, she is scheduled to have a port inserted on 09/06/22.   Today, she states that she is doing well overall. Her appetite level is at ***%. Her energy level is at ***%.   PAST MEDICAL HISTORY:   Past Medical History: 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)    Esophageal stricture    Family history of breast cancer    Family history of colon cancer    Family history of melanoma    Family history of prostate cancer    Fibroids 1998   uterine, history of (left ovaries)   GERD (gastroesophageal reflux  disease)    History of uterine prolapse    Hx of adenomatous polyp of colon 10/19/2014   Hyperlipidemia    Obesity    PVC (premature ventricular contraction)    first dx by holter in 1980's, echo (6/10) EF 55-60%, normal diastolic fxn, normal size RV and fxn, mild MR, PASP   Squamous cell carcinoma of arm    Tubal pregnancy    Ulcerative proctosigmoiditis (HCC)    Urge urinary incontinence 06/03/2007   Qualifier: Diagnosis of  By: Lindwood Qua CMA, Jerl Santos      Surgical History: Past Surgical History:  Procedure Laterality Date   BLADDER NECK SUSPENSION  1998   BREAST BIOPSY Left    BREAST LUMPECTOMY Left    benign   CATARACT EXTRACTION Right 01/2020   CATARACT EXTRACTION W/PHACO Left 08/12/2020   Procedure: CATARACT EXTRACTION PHACO AND INTRAOCULAR LENS PLACEMENT LEFT EYE;  Surgeon: Fabio Pierce, MD;  Location: [facility];  Service: Ophthalmology;  Laterality: Left;  left CDE=10.71   COLONOSCOPY  05/18/2011/10/12/14   Dr. Stan Head   ECTOPIC PREGNANCY SURGERY  1980   ESOPHAGOGASTRODUODENOSCOPY (EGD) WITH ESOPHAGEAL DILATION  04/03/2012   Dr. Stan Head   PARTIAL  HYSTERECTOMY     abdominal, fibroids, and prolapse (1998) bladder tack   SKIN CANCER EXCISION     UPPER GASTROINTESTINAL ENDOSCOPY      Social History: Social History   Socioeconomic History   Marital status: Married    Spouse name: Not on file   Number of children: 3   Years of education: Not on file   Highest education level: Not on file  Occupational History   Occupation: International aid/development worker    Comment: retired 2009    Employer: unemployed  Tobacco Use   Smoking status: Never   Smokeless tobacco: Never  Vaping Use   Vaping status: Never Used  Substance and Sexual Activity   Alcohol use: No    Alcohol/week: 0.0 standard drinks of alcohol    Comment: rarely beer   Drug use: No   Sexual activity: Not Currently  Other Topics Concern   Not on file  Social History Narrative   Retired International aid/development worker,  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)   Grown children   Never smoker no alcohol tobacco or drug use   Social Determinants of Corporate investment banker Strain: Low Risk  (08/28/2022)   Overall Financial Resource Strain (CARDIA)    Difficulty of Paying Living Expenses: Not hard at all  Food Insecurity: No Food Insecurity (08/28/2022)   Hunger Vital Sign    Worried About Running Out of Food in the Last Year: Never true    Ran Out of Food in the Last Year: Never true  Transportation Needs: No Transportation Needs (08/28/2022)   PRAPARE - Administrator, Civil Service (Medical): No    Lack of Transportation (Non-Medical): No  Physical Activity: Inactive (08/28/2022)   Exercise Vital Sign    Days of Exercise per Week: 0 days    Minutes of Exercise per Session: 0 min  Stress: No Stress Concern Present (08/28/2022)   [employer] of Occupational Health - Occupational Stress Questionnaire    Feeling of Stress : Not at all  Social Connections: Moderately Integrated (08/28/2022)   Social Connection and Isolation Panel [NHANES]    Frequency of Communication with Friends and Family: More than three times a week    Frequency of Social Gatherings with Friends and Family: More than three times a week    Attends Religious Services: Never    Database administrator or Organizations: Yes    Attends Engineer, structural: More than 4 times per year    Marital Status: Married  Catering manager Violence: Not At Risk (08/28/2022)   Humiliation, Afraid, Rape, and Kick questionnaire    Fear of Current or Ex-Partner: No    Emotionally Abused: No    Physically Abused: No    Sexually Abused: No    Family History: Family History  Problem Relation Age of Onset   Sudden death Father 32       "coronary arteriosclerosis" on death certificate   Heart attack Mother 64   Transient ischemic attack Mother    Diabetes type II Mother    Sudden death Mother        died age 61    Diabetes Mother    Coronary artery disease Mother    Skin cancer Mother    Barrett's esophagus Mother    Arrhythmia Brother    Colon cancer Brother 55   Breast cancer Sister        dx in her 13s   Colon polyps Sister  adenomatous   Colon cancer Son 37       lynch syndrome   Melanoma Maternal Uncle    Congestive Heart Failure Maternal Grandmother    Prostate cancer Maternal Grandfather        dx in 56s   Tuberculosis Paternal Grandfather    Skin cancer Maternal Aunt    Barrett's esophagus Maternal Aunt    Other Son        parotid gland tumor   Ehlers-Danlos syndrome Daughter    Colon cancer Cousin        mid 62s; maternal cousin   Colon cancer Other        MGMs brother   Esophageal cancer Neg Hx    Stomach cancer Neg Hx    Rectal cancer Neg Hx     Current Medications:  Current Outpatient Medications:    apixaban (ELIQUIS) 5 MG TABS tablet, Take 1 tablet (5 mg total) by mouth 2 (two) times daily., Disp: 60 tablet, Rfl: 2   Ascorbic Acid (VITAMIN C) 1000 MG tablet, Take 1,000 mg by mouth daily., Disp: , Rfl:    calcium citrate-vitamin D (CITRACAL+D) 315-200 MG-UNIT per tablet, Take 1 tablet by mouth 2 (two) times daily., Disp: , Rfl:    Cholecalciferol (VITAMIN D-3) 5000 UNITS TABS, Take 5,000 Units by mouth daily., Disp: , Rfl:    Coenzyme Q10 (CO Q-10) 200 MG CAPS, Take 200 mg by mouth daily., Disp: , Rfl:    CRANBERRY PO, Take 650 mg by mouth daily., Disp: , Rfl:    estradiol (ESTRACE) 0.1 MG/GM vaginal cream, Place 0.5 g vaginally 2 (two) times a week., Disp: 42.5 g, Rfl: 11   Glucosamine-Chondroitin (GLUCOSAMINE CHONDR COMPLEX PO), Take 2 capsules by mouth daily. , Disp: , Rfl:    GRAPE SEED EXTRACT PO, Take 1 capsule by mouth daily., Disp: , Rfl:    hydrocortisone (ANUSOL-HC) 2.5 % rectal cream, Place 1 Application rectally 2 (two) times daily., Disp: 30 g, Rfl: 1   hydrocortisone (CORTENEMA) 100 MG/60ML enema, Use 60ml every night for 28 days, Disp: 1680 mL, Rfl:  0   ketoconazole (NIZORAL) 2 % cream, Apply 1 Application topically daily. To yeast rash, Disp: 30 g, Rfl: 1   lansoprazole (PREVACID) 15 MG capsule, Take 1 capsule (15 mg total) by mouth 2 (two) times daily before a meal. (Patient taking differently: Take 15 mg by mouth daily. Uses over the counter brand), Disp: , Rfl:    LIALDA 1.2 g EC tablet, Take 2 tablets by mouth twice daily, Disp: 120 tablet, Rfl: 2   lidocaine-prilocaine (EMLA) cream, Use on vulva/ urethra as needed for pain., Disp: 30 g, Rfl: 1   meclizine (ANTIVERT) 25 MG tablet, Take 1 tablet (25 mg total) by mouth 3 (three) times daily as needed for dizziness or nausea (vertigo)., Disp: 20 tablet, Rfl: 0   milk thistle 175 MG tablet, Take 175 mg by mouth daily., Disp: , Rfl:    Omega-3 Fatty Acids (FISH OIL TRIPLE STRENGTH PO), Take 1 capsule by mouth daily., Disp: , Rfl:    Polyethyl Glycol-Propyl Glycol (SYSTANE OP), Place 1 drop into both eyes daily., Disp: , Rfl:    prochlorperazine (COMPAZINE) 10 MG tablet, Take 1 tablet (10 mg total) by mouth every 6 (six) hours as needed for nausea or vomiting., Disp: 90 tablet, Rfl: 3   rosuvastatin (CRESTOR) 20 MG tablet, Take 1 tablet (20 mg total) by mouth at bedtime., Disp: 90 tablet, Rfl: 3   vitamin B-12 (  CYANOCOBALAMIN) 1000 MCG tablet, Take 1,000 mcg by mouth daily., Disp: , Rfl:    zinc gluconate 50 MG tablet, Take 50 mg by mouth daily., Disp: , Rfl:    Allergies: Allergies  Allergen Reactions   Pantoprazole Sodium Diarrhea    Abdominal pain   Ranitidine Diarrhea    abdominal pain   Omeprazole Diarrhea    Abdominal pain   Sulfasalazine Rash    Fever, chills, headache, muscle pain    REVIEW OF SYSTEMS:   Review of Systems  Constitutional:  Negative for chills, fatigue and fever.  HENT:   Negative for lump/mass, mouth sores, nosebleeds, sore throat and trouble swallowing.   Eyes:  Negative for eye problems.  Respiratory:  Negative for cough and shortness of breath.    Cardiovascular:  Negative for chest pain, leg swelling and palpitations.  Gastrointestinal:  Negative for abdominal pain, constipation, diarrhea, nausea and vomiting.  Genitourinary:  Negative for bladder incontinence, difficulty urinating, dysuria, frequency, hematuria and nocturia.   Musculoskeletal:  Negative for arthralgias, back pain, flank pain, myalgias and neck pain.  Skin:  Negative for itching and rash.  Neurological:  Negative for dizziness, headaches and numbness.  Hematological:  Does not bruise/bleed easily.  Psychiatric/Behavioral:  Negative for depression, sleep disturbance and suicidal ideas. The patient is not nervous/anxious.   All other systems reviewed and are negative.    VITALS:   Last menstrual period 05/29/1996.  Wt Readings from Last 3 Encounters:  08/31/22 194 lb 3.2 oz (88.1 kg)  08/29/22 194 lb 12.8 oz (88.4 kg)  08/28/22 190 lb (86.2 kg)    There is no height or weight on file to calculate BMI.  Performance status (ECOG): 1 - Symptomatic but completely ambulatory  PHYSICAL EXAM:   Physical Exam Vitals and nursing note reviewed. Exam conducted with a chaperone present.  Constitutional:      Appearance: Normal appearance.  Cardiovascular:     Rate and Rhythm: Normal rate and regular rhythm.     Pulses: Normal pulses.     Heart sounds: Normal heart sounds.  Pulmonary:     Effort: Pulmonary effort is normal.     Breath sounds: Normal breath sounds.  Abdominal:     Palpations: Abdomen is soft. There is no hepatomegaly, splenomegaly or mass.     Tenderness: There is no abdominal tenderness.  Musculoskeletal:     Right lower leg: No edema.     Left lower leg: No edema.  Lymphadenopathy:     Cervical: No cervical adenopathy.     Right cervical: No superficial, deep or posterior cervical adenopathy.    Left cervical: No superficial, deep or posterior cervical adenopathy.     Upper Body:     Right upper body: No supraclavicular or axillary  adenopathy.     Left upper body: No supraclavicular or axillary adenopathy.  Neurological:     General: No focal deficit present.     Mental Status: She is alert and oriented to person, place, and time.  Psychiatric:        Mood and Affect: Mood normal.        Behavior: Behavior normal.     LABS:      Latest Ref Rng & Units 08/26/2022   12:52 PM 08/13/2022    8:43 AM 05/15/2022   11:25 AM  CBC  WBC 4.0 - 10.5 K/uL 6.9  6.0  6.1   Hemoglobin 12.0 - 15.0 g/dL 16.1  09.6  04.5   Hematocrit 36.0 -  46.0 % 39.1  39.3  39.9   Platelets 150 - 400 K/uL 350  311.0  246.0       Latest Ref Rng & Units 08/26/2022   12:52 PM 08/13/2022    8:43 AM 08/01/2022   10:58 AM  CMP  Glucose 70 - 99 mg/dL 93  98  99   BUN 8 - 23 mg/dL 8  10  16    Creatinine 0.44 - 1.00 mg/dL 7.82  9.56  2.13   Sodium 135 - 145 mmol/L 133  138  135   Potassium 3.5 - 5.1 mmol/L 4.0  4.5  4.1   Chloride 98 - 111 mmol/L 102  104  102   CO2 22 - 32 mmol/L 27  25  25    Calcium 8.9 - 10.3 mg/dL 8.8  9.9  08.6   Total Protein 6.5 - 8.1 g/dL 6.6  6.5    Total Bilirubin 0.3 - 1.2 mg/dL 0.9  0.8    Alkaline Phos 38 - 126 U/L 58  63    AST 15 - 41 U/L 18  16    ALT 0 - 44 U/L 12  8       Lab Results  Component Value Date   CEA1 1.0 08/26/2022   /  CEA  Date Value Ref Range Status  08/26/2022 1.0 0.0 - 4.7 ng/mL Final    Comment:    (NOTE)                             Nonsmokers          <3.9                             Smokers             <5.6 Roche [employer] Electrochemiluminescence Immunoassay (ECLIA) Values obtained with different assay methods or kits cannot be used interchangeably.  Results cannot be interpreted as absolute evidence of the presence or absence of malignant disease. Performed At: [facility] [address] [place], [place] [id number] Jolene Schimke MD [id number]    No results found for: "PSA1" No results found for: "UVO536" Lab Results  Component Value Date   UYQ034  7,356.0 (H) 08/26/2022    No results found for: "TOTALPROTELP", "ALBUMINELP", "A1GS", "A2GS", "BETS", "BETA2SER", "GAMS", "MSPIKE", "SPEI" No results found for: "TIBC", "FERRITIN", "IRONPCTSAT" No results found for: "LDH"   STUDIES:   US Paracentesis  Result Date: 08/31/2022 INDICATION: 77 year old female history of peritoneal carcinomatosis and ovarian cancer. Request is for therapeutic and diagnostic paracentesis. EXAM: ULTRASOUND GUIDED THERAPEUTIC AND DIAGNOSTIC PARACENTESIS MEDICATIONS: Lidocaine 1% 10 mL COMPLICATIONS: None immediate. PROCEDURE: Informed written consent was obtained from the patient after a discussion of the risks, benefits and alternatives to treatment. A timeout was performed prior to the initiation of the procedure. Initial ultrasound scanning demonstrates a large amount of ascites within the right lower abdominal quadrant. The right lower abdomen was prepped and draped in the usual sterile fashion. 1% lidocaine was used for local anesthesia. Following this, a 19 gauge, 7-cm, Yueh catheter was introduced. An ultrasound image was saved for documentation purposes. The paracentesis was performed. The catheter was removed and a dressing was applied. The patient tolerated the procedure well without immediate post procedural complication. Patient received post-procedure intravenous albumin; see nursing notes for details. FINDINGS: A total of approximately 7 L of milky yellow fluid was  removed. Samples were sent to the laboratory as requested by the clinical team. IMPRESSION: Successful ultrasound-guided therapeutic and diagnostic paracentesis yielding 7 liters of peritoneal fluid. Read by: Anders Grant, NP Electronically Signed   By: Marliss Coots M.D.   On: 08/31/2022 13:25   CT ABDOMEN PELVIS W CONTRAST  Result Date: 08/26/2022 CLINICAL DATA:  Abdominal distension. EXAM: CT ABDOMEN AND PELVIS WITH CONTRAST TECHNIQUE: Multidetector CT imaging of the abdomen and pelvis was  performed using the standard protocol following bolus administration of intravenous contrast. RADIATION DOSE REDUCTION: This exam was performed according to the departmental dose-optimization program which includes automated exposure control, adjustment of the mA and/or kV according to patient size and/or use of iterative reconstruction technique. CONTRAST:  OMNIPAQUE IOHEXOL 300 MG/ML  SOLN COMPARISON:  August 12, 2014. FINDINGS: Lower chest: Small bilateral pleural effusions are noted with minimal adjacent subsegmental atelectasis. Moderate size sliding-type hiatal hernia is noted. Hepatobiliary: No focal liver abnormality is seen. No gallstones, gallbladder wall thickening, or biliary dilatation. Pancreas: Unremarkable. No pancreatic ductal dilatation or surrounding inflammatory changes. Spleen: Normal in size without focal abnormality. Adrenals/Urinary Tract: Adrenal glands are unremarkable. Kidneys are normal, without renal calculi, focal lesion, or hydronephrosis. Bladder is unremarkable. Stomach/Bowel: There is no evidence of bowel obstruction. The appendix is unremarkable. Diverticulosis of descending and sigmoid colon is noted. Moderate wall thickening of distal sigmoid colon is noted which may represent sequela of prior diverticulitis, but acute inflammation or possibly mass cannot be excluded. Vascular/Lymphatic: Aortic atherosclerosis. No enlarged abdominal or pelvic lymph nodes. Reproductive: Status post hysterectomy. No adnexal masses. Other: Moderate ascites is now noted. Probable omental caking is seen in the left lower quadrant concerning for peritoneal carcinomatosis. Small nodular densities are noted peripherally in the peritoneum in the pelvis suggesting possible peritoneal implants. Musculoskeletal: No acute or significant osseous findings. IMPRESSION: Moderate ascites is now noted. Probable omental caking is noted left lower quadrant. There also are noted possible small peritoneal implants  seen in the pelvis suggesting peritoneal carcinomatosis. Diverticulosis of descending and sigmoid colon is noted. Moderate wall thickening of distal sigmoid colon is noted which may represent sequela prior diverticulitis, but acute inflammation or possible underlying neoplasm or malignancy cannot be excluded. Colonoscopy is recommended. Small bilateral pleural effusions with adjacent subsegmental atelectasis. Moderate size hiatal hernia. Aortic Atherosclerosis (ICD10-I70.0). Electronically Signed   By: Lupita Raider M.D.   On: 08/26/2022 15:31   MM 3D SCREEN BREAST BILATERAL  Result Date: 08/10/2022 CLINICAL DATA:  Screening. EXAM: DIGITAL SCREENING BILATERAL MAMMOGRAM WITH TOMOSYNTHESIS AND CAD TECHNIQUE: Bilateral screening digital craniocaudal and mediolateral oblique mammograms were obtained. Bilateral screening digital breast tomosynthesis was performed. The images were evaluated with computer-aided detection. COMPARISON:  Previous exam(s). ACR Breast Density Category b: There are scattered areas of fibroglandular density. FINDINGS: There are no findings suspicious for malignancy. IMPRESSION: No mammographic evidence of malignancy. A result letter of this screening mammogram will be mailed directly to the patient. RECOMMENDATION: Screening mammogram in one year. (Code:SM-B-01Y) BI-RADS CATEGORY  1: Negative. Electronically Signed   By: Baird Lyons M.D.   On: 08/10/2022 12:49

## 2022-09-04 ENCOUNTER — Inpatient Hospital Stay: Payer: Medicare Other | Admitting: Hematology

## 2022-09-04 ENCOUNTER — Encounter: Payer: Self-pay | Admitting: Internal Medicine

## 2022-09-04 ENCOUNTER — Encounter: Payer: Self-pay | Admitting: Hematology

## 2022-09-04 VITALS — BP 148/88 | HR 101 | Temp 97.9°F | Resp 20 | Wt 186.6 lb

## 2022-09-04 DIAGNOSIS — K222 Esophageal obstruction: Secondary | ICD-10-CM | POA: Diagnosis not present

## 2022-09-04 DIAGNOSIS — C800 Disseminated malignant neoplasm, unspecified: Secondary | ICD-10-CM

## 2022-09-04 DIAGNOSIS — Z8042 Family history of malignant neoplasm of prostate: Secondary | ICD-10-CM | POA: Diagnosis not present

## 2022-09-04 DIAGNOSIS — K5130 Ulcerative (chronic) rectosigmoiditis without complications: Secondary | ICD-10-CM | POA: Diagnosis not present

## 2022-09-04 DIAGNOSIS — I82409 Acute embolism and thrombosis of unspecified deep veins of unspecified lower extremity: Secondary | ICD-10-CM | POA: Diagnosis not present

## 2022-09-04 DIAGNOSIS — R971 Elevated cancer antigen 125 [CA 125]: Secondary | ICD-10-CM | POA: Diagnosis not present

## 2022-09-04 DIAGNOSIS — E785 Hyperlipidemia, unspecified: Secondary | ICD-10-CM | POA: Diagnosis not present

## 2022-09-04 DIAGNOSIS — Z7901 Long term (current) use of anticoagulants: Secondary | ICD-10-CM | POA: Diagnosis not present

## 2022-09-04 DIAGNOSIS — E669 Obesity, unspecified: Secondary | ICD-10-CM | POA: Diagnosis not present

## 2022-09-04 DIAGNOSIS — C579 Malignant neoplasm of female genital organ, unspecified: Secondary | ICD-10-CM | POA: Diagnosis present

## 2022-09-04 DIAGNOSIS — Z800 Family history of malignant neoplasm of digestive organs: Secondary | ICD-10-CM | POA: Diagnosis not present

## 2022-09-04 DIAGNOSIS — Z808 Family history of malignant neoplasm of other organs or systems: Secondary | ICD-10-CM | POA: Diagnosis not present

## 2022-09-04 DIAGNOSIS — Z79899 Other long term (current) drug therapy: Secondary | ICD-10-CM | POA: Diagnosis not present

## 2022-09-04 DIAGNOSIS — K219 Gastro-esophageal reflux disease without esophagitis: Secondary | ICD-10-CM | POA: Diagnosis not present

## 2022-09-04 DIAGNOSIS — R188 Other ascites: Secondary | ICD-10-CM | POA: Diagnosis not present

## 2022-09-04 NOTE — Patient Instructions (Addendum)
[facility] at [facility] Discharge Instructions   You were seen and examined today by Dr. Ellin Saba.  He discussed with you treatment with a combination of chemotherapy drugs. These are Taxol and carboplatin. These are given in the clinic every 3 weeks. We give a total of 6 cycles. We will repeat scans after cycle 3. We will have a surgeon evaluate the response and your readiness for surgery.   We will arrange for you to come have an education session with our chemotherapy educator, Revonda Standard.   Have the port placed as scheduled.   Return to the clinic as scheduled.    Thank you for choosing [facility] at [facility] to provide your oncology and hematology care.  To afford each patient quality time with our provider, please arrive at least 15 minutes before your scheduled appointment time.   If you have a lab appointment with the Cancer Center please come in thru the Main Entrance and check in at the main information desk.  You need to re-schedule your appointment should you arrive 10 or more minutes late.  We strive to give you quality time with our providers, and arriving late affects you and other patients whose appointments are after yours.  Also, if you no show three or more times for appointments you may be dismissed from the clinic at the providers discretion.     Again, thank you for choosing [facility].  Our hope is that these requests will decrease the amount of time that you wait before being seen by our physicians.       _____________________________________________________________  Should you have questions after your visit to [facility], please contact our office at [phone] and follow the prompts.  Our office hours are 8:00 a.m. and 4:30 p.m. Monday - Friday.  Please note that voicemails left after 4:00 p.m. may not be returned until the following business day.  We are closed weekends and major  holidays.  You do have access to a nurse 24-7, just call the main number to the clinic [phone] and do not press any options, hold on the line and a nurse will answer the phone.    For prescription refill requests, have your pharmacy contact our office and allow 72 hours.    Due to Covid, you will need to wear a mask upon entering the hospital. If you do not have a mask, a mask will be given to you at the Main Entrance upon arrival. For doctor visits, patients may have 1 support person age 52 or older with them. For treatment visits, patients can not have anyone with them due to social distancing guidelines and our immunocompromised population.

## 2022-09-04 NOTE — Progress Notes (Signed)
START ON PATHWAY REGIMEN - Ovarian     A cycle is every 21 days:     Paclitaxel      Carboplatin   **Always confirm dose/schedule in your pharmacy ordering system**  Patient Characteristics: Preoperative or Nonsurgical Candidate (Clinical Staging), Newly Diagnosed, Neoadjuvant Therapy followed by Surgery BRCA Mutation Status: Absent Therapeutic Status: Preoperative or Nonsurgical Candidate (Clinical Staging) AJCC T Category: cTX AJCC 8 Stage Grouping: IIIC AJCC N Category: cNX AJCC M Category: cM0 Therapy Plan: Neoadjuvant Therapy followed by Surgery Intent of Therapy: Curative Intent, Discussed with Patient

## 2022-09-05 ENCOUNTER — Other Ambulatory Visit: Payer: Self-pay

## 2022-09-05 ENCOUNTER — Telehealth: Payer: Self-pay | Admitting: Family Medicine

## 2022-09-05 ENCOUNTER — Other Ambulatory Visit: Payer: Self-pay | Admitting: Student

## 2022-09-05 DIAGNOSIS — R7303 Prediabetes: Secondary | ICD-10-CM

## 2022-09-05 NOTE — Telephone Encounter (Signed)
Pt called to let Dr. Milinda Antis know that she's thankful for her concern & interest in her case. Pt wanted to let Dr. Milinda Antis know that her cancer type is still unknown. Call back # [phone]

## 2022-09-06 ENCOUNTER — Other Ambulatory Visit: Payer: Self-pay

## 2022-09-06 ENCOUNTER — Ambulatory Visit (HOSPITAL_COMMUNITY)
Admission: RE | Admit: 2022-09-06 | Discharge: 2022-09-06 | Disposition: A | Discharge status: Home / Self Care | Payer: Medicare Other | Source: Ambulatory Visit | Attending: Hematology | Admitting: Hematology

## 2022-09-06 ENCOUNTER — Encounter (HOSPITAL_COMMUNITY): Payer: Self-pay

## 2022-09-06 DIAGNOSIS — K660 Peritoneal adhesions (postprocedural) (postinfection): Secondary | ICD-10-CM | POA: Diagnosis not present

## 2022-09-06 DIAGNOSIS — R188 Other ascites: Secondary | ICD-10-CM | POA: Diagnosis not present

## 2022-09-06 DIAGNOSIS — C569 Malignant neoplasm of unspecified ovary: Secondary | ICD-10-CM | POA: Diagnosis present

## 2022-09-06 DIAGNOSIS — Z452 Encounter for adjustment and management of vascular access device: Secondary | ICD-10-CM | POA: Diagnosis not present

## 2022-09-06 DIAGNOSIS — R7303 Prediabetes: Secondary | ICD-10-CM

## 2022-09-06 DIAGNOSIS — C786 Secondary malignant neoplasm of retroperitoneum and peritoneum: Secondary | ICD-10-CM | POA: Insufficient documentation

## 2022-09-06 DIAGNOSIS — C800 Disseminated malignant neoplasm, unspecified: Secondary | ICD-10-CM

## 2022-09-06 HISTORY — PX: IR IMAGING GUIDED PORT INSERTION: IMG5740

## 2022-09-06 MED ORDER — MIDAZOLAM HCL 2 MG/2ML IJ SOLN
INTRAMUSCULAR | Status: AC
  Filled 2022-09-06: qty 2

## 2022-09-06 MED ORDER — FENTANYL CITRATE (PF) 100 MCG/2ML IJ SOLN
INTRAMUSCULAR | Status: AC
  Filled 2022-09-06: qty 2

## 2022-09-06 MED ORDER — HEPARIN SOD (PORK) LOCK FLUSH 100 UNIT/ML IV SOLN
500.0000 [IU] | Freq: Once | INTRAVENOUS | Status: AC
  Administered 2022-09-06: 500 [IU] via INTRAVENOUS

## 2022-09-06 MED ORDER — LIDOCAINE-EPINEPHRINE 1 %-1:100000 IJ SOLN
INTRAMUSCULAR | Status: AC
  Filled 2022-09-06: qty 1

## 2022-09-06 MED ORDER — LIDOCAINE-EPINEPHRINE 1 %-1:100000 IJ SOLN
20.0000 mL | Freq: Once | INTRAMUSCULAR | Status: AC
  Administered 2022-09-06: 20 mL via INTRADERMAL

## 2022-09-06 MED ORDER — FENTANYL CITRATE (PF) 100 MCG/2ML IJ SOLN
INTRAMUSCULAR | Status: AC | PRN
Start: 2022-09-06 — End: 2022-09-06
  Administered 2022-09-06 (×2): 25 ug via INTRAVENOUS

## 2022-09-06 MED ORDER — SODIUM CHLORIDE 0.9 % IV SOLN: INTRAVENOUS | Status: DC

## 2022-09-06 MED ORDER — MIDAZOLAM HCL 2 MG/2ML IJ SOLN
INTRAMUSCULAR | Status: AC | PRN
Start: 2022-09-06 — End: 2022-09-06
  Administered 2022-09-06: .5 mg via INTRAVENOUS
  Administered 2022-09-06: 1 mg via INTRAVENOUS

## 2022-09-06 MED ORDER — HEPARIN SOD (PORK) LOCK FLUSH 100 UNIT/ML IV SOLN
INTRAVENOUS | Status: AC
  Filled 2022-09-06: qty 5

## 2022-09-06 NOTE — Procedures (Signed)
Vascular and Interventional Radiology Procedure Note  Patient: Rachel Carlson DOB: 16-Oct-1945 Medical Record Number: [id number] Note Date/Time: 09/06/22 11:24 AM   Performing Physician: Roanna Banning, MD Assistant(s): None  Diagnosis: Ovarian cancer  Procedure: PORT PLACEMENT  Anesthesia: Conscious Sedation Complications: None Estimated Blood Loss: Minimal  Findings:  Successful right-sided port placement, with the tip of the catheter in the proximal right atrium.  Plan: Catheter ready for use.  See detailed procedure note with images in PACS. The patient tolerated the procedure well without incident or complication and was returned to Recovery in stable condition.    Roanna Banning, MD [facility] Radiology   Pager. [phone] Clinic. [phone]

## 2022-09-06 NOTE — H&P (Signed)
Chief Complaint: Patient was seen in consultation today for [facility] a cath placement at the request of Katragadda,Sreedhar  Referring Physician(s): Doreatha Massed  Supervising Physician: Mir, Secondary school teacher  Patient Status: [facility] - Out-pt  History of Present Illness: Rachel Carlson is a 77 y.o. female   FULL Code status per pt Peritoneal carcinomatosis/Ovarian Cancer Abd bloating; distension  Clinical History: Peritoneal carcinomatosis  Specimen Submitted:  A. ASCITES, PARACENTESIS:  FINAL MICROSCOPIC DIAGNOSIS:  - Malignant cells present   To start chemotherapy next week Follows with Dr Ellin Saba  Scheduled for [facility] a cath placement in IR  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)    Esophageal stricture    Family history of breast cancer    Family history of colon cancer    Family history of melanoma    Family history of prostate cancer    Fibroids 1998   uterine, history of (left ovaries)   GERD (gastroesophageal reflux disease)    History of uterine prolapse    Hx of adenomatous polyp of colon 10/19/2014   Hyperlipidemia    Obesity    PVC (premature ventricular contraction)    first dx by holter in 1980's, echo (6/10) EF 55-60%, normal diastolic fxn, normal size RV and fxn, mild MR, PASP   Squamous cell carcinoma of arm    Tubal pregnancy    Ulcerative proctosigmoiditis (HCC)    Urge urinary incontinence 06/03/2007   Qualifier: Diagnosis of  By: Lindwood Qua CMA, Jerl Santos      Past Surgical History:  Procedure Laterality Date   BLADDER NECK SUSPENSION  1998   BREAST BIOPSY Left    BREAST LUMPECTOMY Left    benign   CATARACT EXTRACTION Right 01/2020   CATARACT EXTRACTION W/PHACO Left 08/12/2020   Procedure: CATARACT EXTRACTION PHACO AND INTRAOCULAR LENS PLACEMENT LEFT EYE;  Surgeon: Fabio Pierce, MD;  Location: [facility]  ORS;  Service: Ophthalmology;  Laterality: Left;  left CDE=10.71   COLONOSCOPY  05/18/2011/10/12/14   Dr. Stan Head   ECTOPIC PREGNANCY SURGERY  1980   ESOPHAGOGASTRODUODENOSCOPY (EGD) WITH ESOPHAGEAL DILATION  04/03/2012   Dr. Stan Head   PARTIAL HYSTERECTOMY     abdominal, fibroids, and prolapse (1998) bladder tack   SKIN CANCER EXCISION     UPPER GASTROINTESTINAL ENDOSCOPY      Allergies: Barbiturates, Pantoprazole sodium, Ranitidine, Omeprazole, and Sulfasalazine  Medications: Prior to Admission medications   Medication Sig Start Date End Date Taking? Authorizing Provider  Ascorbic Acid (VITAMIN C) 1000 MG tablet Take 1,000 mg by mouth daily.   Yes [provider]  calcium citrate-vitamin D (CITRACAL+D) 315-200 MG-UNIT per tablet Take 1 tablet by mouth 2 (two) times daily.   Yes [provider]  Cholecalciferol (VITAMIN D-3) 5000 UNITS TABS Take 5,000 Units by mouth daily.   Yes [provider]  Coenzyme Q10 (CO Q-10) 200 MG CAPS Take 200 mg by mouth daily.   Yes [provider]  CRANBERRY PO Take 650 mg by mouth daily.   Yes [provider]  Glucosamine-Chondroitin (GLUCOSAMINE CHONDR COMPLEX PO) Take 2 capsules by mouth daily.    Yes [provider]  GRAPE SEED EXTRACT PO Take 1 capsule by mouth daily.   Yes [provider]  lansoprazole (PREVACID) 15 MG capsule Take 1 capsule (15 mg total) by mouth 2 (two) times daily  before a meal. Patient taking differently: Take 15 mg by mouth daily. Uses over the counter brand 09/02/17  Yes Iva Boop, MD  LIALDA 1.2 g EC tablet Take 2 tablets by mouth twice daily 07/10/22  Yes Iva Boop, MD  milk thistle 175 MG tablet Take 175 mg by mouth daily.   Yes [provider]  Omega-3 Fatty Acids (FISH OIL TRIPLE STRENGTH PO) Take 1 capsule by mouth daily.   Yes [provider]  Polyethyl Glycol-Propyl Glycol (SYSTANE OP) Place 1 drop into both eyes daily.    Yes [provider]  prochlorperazine (COMPAZINE) 10 MG tablet Take 1 tablet (10 mg total) by mouth every 6 (six) hours as needed for nausea or vomiting. 08/29/22  Yes Doreatha Massed, MD  rosuvastatin (CRESTOR) 20 MG tablet Take 1 tablet (20 mg total) by mouth at bedtime. 08/20/22  Yes Tower, Audrie Gallus, MD  vitamin B-12 (CYANOCOBALAMIN) 1000 MCG tablet Take 1,000 mcg by mouth daily.   Yes [provider]  zinc gluconate 50 MG tablet Take 50 mg by mouth daily.   Yes [provider]  apixaban (ELIQUIS) 5 MG TABS tablet Take 1 tablet (5 mg total) by mouth 2 (two) times daily. 08/20/22   Tower, Audrie Gallus, MD  estradiol (ESTRACE) 0.1 MG/GM vaginal cream Place 0.5 g vaginally 2 (two) times a week. 01/18/22   Marguerita Beards, MD  hydrocortisone (ANUSOL-HC) 2.5 % rectal cream Place 1 Application rectally 2 (two) times daily. 10/13/21   Iva Boop, MD  hydrocortisone Laurance Flatten) 100 MG/60ML enema Use 60ml every night for 28 days 10/20/21   Unk Lightning, PA  ketoconazole (NIZORAL) 2 % cream Apply 1 Application topically daily. To yeast rash 08/17/21   Tower, Audrie Gallus, MD  lidocaine-prilocaine (EMLA) cream Use on vulva/ urethra as needed for pain. Patient not taking: Reported on 09/04/2022 04/06/22   Marguerita Beards, MD  meclizine (ANTIVERT) 25 MG tablet Take 1 tablet (25 mg total) by mouth 3 (three) times daily as needed for dizziness or nausea (vertigo). Patient not taking: Reported on 09/04/2022 01/03/22   Tower, Audrie Gallus, MD  Na Sulfate-K Sulfate-Mg Sulf 17.5-3.13-1.6 GM/177ML SOLN SMARTSIG:1 Kit(s) By Mouth Once 08/29/22   [provider]     Family History  Problem Relation Age of Onset   Sudden death Father 74       "coronary arteriosclerosis" on death certificate   Heart attack Mother 42   Transient ischemic attack Mother    Diabetes type II Mother    Sudden death Mother        died age 77   Diabetes Mother    Coronary artery disease Mother     Skin cancer Mother    Barrett's esophagus Mother    Arrhythmia Brother    Colon cancer Brother 32   Breast cancer Sister        dx in her 24s   Colon polyps Sister        adenomatous   Colon cancer Son 39       lynch syndrome   Melanoma Maternal Uncle    Congestive Heart Failure Maternal Grandmother    Prostate cancer Maternal Grandfather        dx in 38s   Tuberculosis Paternal Grandfather    Skin cancer Maternal Aunt    Barrett's esophagus Maternal Aunt    Other Son        parotid gland tumor   Ehlers-Danlos syndrome Daughter  Colon cancer Cousin        mid 33s; maternal cousin   Colon cancer Other        MGMs brother   Esophageal cancer Neg Hx    Stomach cancer Neg Hx    Rectal cancer Neg Hx     Social History   Socioeconomic History   Marital status: Married    Spouse name: Not on file   Number of children: 3   Years of education: Not on file   Highest education level: Not on file  Occupational History   Occupation: International aid/development worker    Comment: retired 2009    Employer: unemployed  Tobacco Use   Smoking status: Never   Smokeless tobacco: Never  Vaping Use   Vaping status: Never Used  Substance and Sexual Activity   Alcohol use: No    Alcohol/week: 0.0 standard drinks of alcohol    Comment: rarely beer   Drug use: No   Sexual activity: Not Currently  Other Topics Concern   Not on file  Social History Narrative   Retired International aid/development worker, 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)   Grown children   Never smoker no alcohol tobacco or drug use   Social Determinants of Corporate investment banker Strain: Low Risk  (08/28/2022)   Overall Financial Resource Strain (CARDIA)    Difficulty of Paying Living Expenses: Not hard at all  Food Insecurity: No Food Insecurity (08/28/2022)   Hunger Vital Sign    Worried About Running Out of Food in the Last Year: Never true    Ran Out of Food in the Last Year: Never true  Transportation Needs: No Transportation Needs  (08/28/2022)   PRAPARE - Administrator, Civil Service (Medical): No    Lack of Transportation (Non-Medical): No  Physical Activity: Inactive (08/28/2022)   Exercise Vital Sign    Days of Exercise per Week: 0 days    Minutes of Exercise per Session: 0 min  Stress: No Stress Concern Present (08/28/2022)   [employer] of Occupational Health - Occupational Stress Questionnaire    Feeling of Stress : Not at all  Social Connections: Moderately Integrated (08/28/2022)   Social Connection and Isolation Panel [NHANES]    Frequency of Communication with Friends and Family: More than three times a week    Frequency of Social Gatherings with Friends and Family: More than three times a week    Attends Religious Services: Never    Database administrator or Organizations: Yes    Attends Engineer, structural: More than 4 times per year    Marital Status: Married    Review of Systems: A 12 point ROS discussed and pertinent positives are indicated in the HPI above.  All other systems are negative.  Review of Systems  Constitutional:  Negative for activity change, fatigue and fever.  Respiratory:  Negative for cough and shortness of breath.   Cardiovascular:  Negative for chest pain.  Gastrointestinal:  Positive for abdominal distention and abdominal pain.  Neurological:  Negative for weakness.  Psychiatric/Behavioral:  Negative for behavioral problems and confusion.     Vital Signs: BP 136/79   Pulse (!) 101   Temp (!) 97.2 F (36.2 C) (Temporal)   Resp 18   Ht 5\' 2"  (1.575 m)   Wt 185 lb (83.9 kg)   LMP 05/29/1996   SpO2 98%   BMI 33.84 kg/m   Advance Care Plan: The advanced care plan/surrogate decision maker  was discussed at the time of visit and documented in the medical record.    Physical Exam Vitals reviewed.  HENT:     Mouth/Throat:     Mouth: Mucous membranes are moist.  Cardiovascular:     Rate and Rhythm: Normal rate and regular rhythm.     Heart  sounds: Normal heart sounds.  Pulmonary:     Effort: Pulmonary effort is normal.     Breath sounds: Normal breath sounds.  Abdominal:     General: There is distension.     Palpations: Abdomen is soft.     Tenderness: There is abdominal tenderness.  Musculoskeletal:        General: Normal range of motion.  Skin:    General: Skin is warm.  Neurological:     Mental Status: She is alert and oriented to person, place, and time.  Psychiatric:        Behavior: Behavior normal.     Imaging: CT Chest W Contrast  Result Date: 09/05/2022 CLINICAL DATA:  Staging ovarian cancer.  * Tracking Code: BO * EXAM: CT CHEST WITH CONTRAST TECHNIQUE: Multidetector CT imaging of the chest was performed during intravenous contrast administration. RADIATION DOSE REDUCTION: This exam was performed according to the departmental dose-optimization program which includes automated exposure control, adjustment of the mA and/or kV according to patient size and/or use of iterative reconstruction technique. CONTRAST:  75mL OMNIPAQUE IOHEXOL 300 MG/ML  SOLN COMPARISON:  CT high-resolution 04/13/2018. Separate abdomen pelvis CT 08/23/2018 FINDINGS: Cardiovascular: Trace pericardial fluid. Heart is nonenlarged. Coronary artery calcifications are seen. The thoracic aorta has a normal course and caliber with scattered atherosclerotic calcified plaque. There is a bovine type aortic arch, normal variant. Mediastinum/Nodes: Small thyroid gland. Slightly patulous esophagus. There is moderate to large hiatal hernia. There is also fluid tracking along the hernia sac in the mediastinum. No specific abnormal lymph node enlargement identified in the axillary region, hilum or mediastinum. Calcified hilar lymph nodes are identified. Lungs/Pleura: Small bilateral pleural effusions are seen, left-greater-than-right with some adjacent opacities. Favor atelectasis. There are some areas of atelectasis well in the middle lobe. Diffuse breathing  motion. No dominant lung mass. Calcified nodule in the right lower lobe anteromedial on series 4, image 69 consistent with old granulomatous disease. Upper Abdomen: Diffuse ascites identified in the upper abdomen. Please correlate with prior CT scan Musculoskeletal: Scattered moderate degenerative changes of the spine. IMPRESSION: Small pleural effusions are seen with the adjacent opacities, left-greater-than-right. No developing mass lesion or lymph node enlargement in the thorax. Evidence of old granulomatous disease. Large hiatal hernia. There also fluid tracking into the mediastinum from the abdominal size along the hernia sac. Ascites in the upper abdomen.  Please correlate with prior CT scan. Aortic Atherosclerosis (ICD10-I70.0). Electronically Signed   By: Karen Kays M.D.   On: 09/05/2022 12:39   US Paracentesis  Result Date: 08/31/2022 INDICATION: 77 year old female history of peritoneal carcinomatosis and ovarian cancer. Request is for therapeutic and diagnostic paracentesis. EXAM: ULTRASOUND GUIDED THERAPEUTIC AND DIAGNOSTIC PARACENTESIS MEDICATIONS: Lidocaine 1% 10 mL COMPLICATIONS: None immediate. PROCEDURE: Informed written consent was obtained from the patient after a discussion of the risks, benefits and alternatives to treatment. A timeout was performed prior to the initiation of the procedure. Initial ultrasound scanning demonstrates a large amount of ascites within the right lower abdominal quadrant. The right lower abdomen was prepped and draped in the usual sterile fashion. 1% lidocaine was used for local anesthesia. Following this, a 19 gauge, 7-cm, Yueh catheter  was introduced. An ultrasound image was saved for documentation purposes. The paracentesis was performed. The catheter was removed and a dressing was applied. The patient tolerated the procedure well without immediate post procedural complication. Patient received post-procedure intravenous albumin; see nursing notes for details.  FINDINGS: A total of approximately 7 L of milky yellow fluid was removed. Samples were sent to the laboratory as requested by the clinical team. IMPRESSION: Successful ultrasound-guided therapeutic and diagnostic paracentesis yielding 7 liters of peritoneal fluid. Read by: Anders Grant, NP Electronically Signed   By: Marliss Coots M.D.   On: 08/31/2022 13:25   CT ABDOMEN PELVIS W CONTRAST  Result Date: 08/26/2022 CLINICAL DATA:  Abdominal distension. EXAM: CT ABDOMEN AND PELVIS WITH CONTRAST TECHNIQUE: Multidetector CT imaging of the abdomen and pelvis was performed using the standard protocol following bolus administration of intravenous contrast. RADIATION DOSE REDUCTION: This exam was performed according to the departmental dose-optimization program which includes automated exposure control, adjustment of the mA and/or kV according to patient size and/or use of iterative reconstruction technique. CONTRAST:  OMNIPAQUE IOHEXOL 300 MG/ML  SOLN COMPARISON:  August 12, 2014. FINDINGS: Lower chest: Small bilateral pleural effusions are noted with minimal adjacent subsegmental atelectasis. Moderate size sliding-type hiatal hernia is noted. Hepatobiliary: No focal liver abnormality is seen. No gallstones, gallbladder wall thickening, or biliary dilatation. Pancreas: Unremarkable. No pancreatic ductal dilatation or surrounding inflammatory changes. Spleen: Normal in size without focal abnormality. Adrenals/Urinary Tract: Adrenal glands are unremarkable. Kidneys are normal, without renal calculi, focal lesion, or hydronephrosis. Bladder is unremarkable. Stomach/Bowel: There is no evidence of bowel obstruction. The appendix is unremarkable. Diverticulosis of descending and sigmoid colon is noted. Moderate wall thickening of distal sigmoid colon is noted which may represent sequela of prior diverticulitis, but acute inflammation or possibly mass cannot be excluded. Vascular/Lymphatic: Aortic atherosclerosis. No  enlarged abdominal or pelvic lymph nodes. Reproductive: Status post hysterectomy. No adnexal masses. Other: Moderate ascites is now noted. Probable omental caking is seen in the left lower quadrant concerning for peritoneal carcinomatosis. Small nodular densities are noted peripherally in the peritoneum in the pelvis suggesting possible peritoneal implants. Musculoskeletal: No acute or significant osseous findings. IMPRESSION: Moderate ascites is now noted. Probable omental caking is noted left lower quadrant. There also are noted possible small peritoneal implants seen in the pelvis suggesting peritoneal carcinomatosis. Diverticulosis of descending and sigmoid colon is noted. Moderate wall thickening of distal sigmoid colon is noted which may represent sequela prior diverticulitis, but acute inflammation or possible underlying neoplasm or malignancy cannot be excluded. Colonoscopy is recommended. Small bilateral pleural effusions with adjacent subsegmental atelectasis. Moderate size hiatal hernia. Aortic Atherosclerosis (ICD10-I70.0). Electronically Signed   By: Lupita Raider M.D.   On: 08/26/2022 15:31   MM 3D SCREEN BREAST BILATERAL  Result Date: 08/10/2022 CLINICAL DATA:  Screening. EXAM: DIGITAL SCREENING BILATERAL MAMMOGRAM WITH TOMOSYNTHESIS AND CAD TECHNIQUE: Bilateral screening digital craniocaudal and mediolateral oblique mammograms were obtained. Bilateral screening digital breast tomosynthesis was performed. The images were evaluated with computer-aided detection. COMPARISON:  Previous exam(s). ACR Breast Density Category b: There are scattered areas of fibroglandular density. FINDINGS: There are no findings suspicious for malignancy. IMPRESSION: No mammographic evidence of malignancy. A result letter of this screening mammogram will be mailed directly to the patient. RECOMMENDATION: Screening mammogram in one year. (Code:[id number]-B-01Y) BI-RADS CATEGORY  1: Negative. Electronically Signed   By: Baird Lyons M.D.   On: 08/10/2022 12:49    Labs:  CBC: Recent Labs  05/15/22 1125 08/13/22 0843 08/26/22 1252  WBC 6.1 6.0 6.9  HGB 13.3 12.7 12.7  HCT 39.9 39.3 39.1  PLT 246.0 311.0 350    COAGS: Recent Labs    05/15/22 1125  INR 1.1*    BMP: Recent Labs    07/02/22 1239 08/01/22 1058 08/13/22 0843 08/26/22 1252  NA 137 135 138 133*  K 4.1 4.1 4.5 4.0  CL 104 102 104 102  CO2 23 25 25 27   GLUCOSE 96 99 98 93  BUN 8 16 10 8   CALCIUM 9.5 10.2 9.9 8.8*  CREATININE 0.63 0.70 0.68 0.56  GFRNONAA  --   --   --  >60    LIVER FUNCTION TESTS: Recent Labs    05/15/22 1125 08/13/22 0843 08/26/22 1252  BILITOT 0.6 0.8 0.9  AST 14 16 18   ALT 10 8 12   ALKPHOS 63 63 58  PROT 7.1 6.5 6.6  ALBUMIN 4.4 3.9 3.3*    TUMOR MARKERS: No results for input(s): "AFPTM", "CEA", "CA199", "CHROMGRNA" in the last 8760 hours.  Assessment and Plan:  Ovarian Cancer For Port a cath placement today Start chemo next week Risks and benefits of image guided port-a-catheter placement was discussed with the patient including, but not limited to bleeding, infection, pneumothorax, or fibrin sheath development and need for additional procedures.  All of the patient's questions were answered, patient is agreeable to proceed. Consent signed and in chart.  Thank you for this interesting consult.  I greatly enjoyed meeting Rachel Carlson and look forward to participating in their care.  A copy of this report was sent to the requesting provider on this date.  Electronically Signed: Robet Leu, PA-C 09/06/2022, 9:35 AM   I spent a total of  30 Minutes   in face to face in clinical consultation, greater than 50% of which was counseling/coordinating care for [facility] placement

## 2022-09-07 ENCOUNTER — Encounter: Payer: Self-pay | Admitting: Gastroenterology

## 2022-09-07 ENCOUNTER — Ambulatory Visit (AMBULATORY_SURGERY_CENTER): Payer: Medicare Other | Admitting: Gastroenterology

## 2022-09-07 VITALS — BP 141/63 | HR 63 | Temp 96.8°F | Resp 17 | Ht 61.0 in | Wt 188.0 lb

## 2022-09-07 DIAGNOSIS — C187 Malignant neoplasm of sigmoid colon: Secondary | ICD-10-CM | POA: Diagnosis not present

## 2022-09-07 DIAGNOSIS — C786 Secondary malignant neoplasm of retroperitoneum and peritoneum: Secondary | ICD-10-CM

## 2022-09-07 DIAGNOSIS — D123 Benign neoplasm of transverse colon: Secondary | ICD-10-CM | POA: Diagnosis not present

## 2022-09-07 DIAGNOSIS — K5730 Diverticulosis of large intestine without perforation or abscess without bleeding: Secondary | ICD-10-CM

## 2022-09-07 MED ORDER — SODIUM CHLORIDE 0.9 % IV SOLN: 500.0000 mL | Freq: Once | INTRAVENOUS | Status: DC

## 2022-09-07 NOTE — Progress Notes (Signed)
VS completed by JF   Pt's states no medical or surgical changes since previsit or office visit.

## 2022-09-07 NOTE — Progress Notes (Signed)
Patient coughed clear secretions during procedure. Oropharynx suctioned with clear secretions removed. Nothing gastric noted. FiO2 increased. Otherwise, uneventful anesthetic.  Report to pacu rn. Vss on O2. Care resumed by rn.

## 2022-09-07 NOTE — Op Note (Signed)
[facility] Patient Name: Rachel Carlson Procedure Date: 09/07/2022 11:32 AM MRN: [id number] Endoscopist: Doristine Locks , MD, [id number] Age: 77 Referring MD:  Date of Birth: 1945-02-02 Gender: Female Account #: [id number] Procedure:                Colonoscopy Indications:              Peritoneal malignancy, Abnormal CT of the GI tract Medicines:                Monitored Anesthesia Care Procedure:                Pre-Anesthesia Assessment:                           - Prior to the procedure, a History and Physical                            was performed, and patient medications and                            allergies were reviewed. The patient's tolerance of                            previous anesthesia was also reviewed. The risks                            and benefits of the procedure and the sedation                            options and risks were discussed with the patient.                            All questions were answered, and informed consent                            was obtained. Prior Anticoagulants: The patient has                            taken no anticoagulant or antiplatelet agents. ASA                            Grade Assessment: III - A patient with severe                            systemic disease. After reviewing the risks and                            benefits, the patient was deemed in satisfactory                            condition to undergo the procedure.                           After obtaining informed consent, the colonoscope  was passed under direct vision. Throughout the                            procedure, the patient's blood pressure, pulse, and                            oxygen saturations were monitored continuously. The                            Olympus Scope [id number] was introduced through the                            anus and advanced to the the cecum, identified by                             appendiceal orifice and ileocecal valve. The                            colonoscopy was technically difficult and complex                            due to extrinsic compression and significant                            looping. Successful completion of the procedure was                            aided by withdrawing the scope and replacing with                            the UltraSlim scope, placing an abdominal binder,                            and providing external abdominal pressure. The                            patient tolerated the procedure well. The quality                            of the bowel preparation was good. The ileocecal                            valve, appendiceal orifice, and rectum were                            photographed. Scope In: 11:45:17 AM Scope Out: 12:23:09 PM Scope Withdrawal Time: 0 hours 17 minutes 59 seconds  Total Procedure Duration: 0 hours 37 minutes 52 seconds  Findings:                 The perianal and digital rectal examinations were                            normal.  There was significant luminal narrowing with                            apparent extaluminal compression in the distal                            sigmoid colon, located 20 cm from the anal verge.                            The luminal narrowing combined with significant                            looping precluded advancement of the colonoscope.                            This was withdrawn. An abdominal binder was placed,                            and the colonoscope was replaced with the Ultraslim                            scope. This was then traversable. The mucosa in                            this areas was moderately congested x4-5 cm in                            length. Several mucosal biopsies were taken from                            this area with a cold forceps for histology.                            Estimated blood loss was  minimal.                           Multiple medium-mouthed and small-mouthed                            diverticula were found in the entire colon.                           A 5 mm polyp was found in the transverse colon. The                            polyp was sessile. The polyp was removed with a                            cold snare. Resection and retrieval were complete.                            Estimated blood loss was minimal.  The retroflexed view of the distal rectum and anal                            verge was normal and showed no anal or rectal                            abnormalities. Complications:            No immediate complications. Estimated Blood Loss:     Estimated blood loss was minimal. Impression:               - There was significant luminal narrowing with                            apparent extaluminal compression in the distal                            sigmoid colon, located 20 cm from the anal verge.                            This was traversable only after replacement with                            the Ultraslim scope and placement of the abdominal                            binder. The mucosa in this area was edematous. This                            was biopsied.                           - Diverticulosis in the entire examined colon.                           - One 5 mm polyp in the transverse colon, removed                            with a cold snare. Resected and retrieved.                           - The distal rectum and anal verge are normal on                            retroflexion view.                           - At the conclusion of the procedure, the Ultraslim                            scope was reintroduced to the proximal ascending                            colon, and the colon was completely desuflated. Recommendation:           -  Patient has a contact number available for                             emergencies. The signs and symptoms of potential                            delayed complications were discussed with the                            patient. Return to normal activities tomorrow.                            Written discharge instructions were provided to the                            patient.                           - Resume previous diet.                           - Continue present medications.                           - Await pathology results.                           - Follow-up with Dr. Pricilla Holm in the Gyn-Oncology                            Clinic and Dr. Ellin Saba in the Oncology Clinic. Doristine Locks, MD 09/07/2022 12:36:57 PM

## 2022-09-07 NOTE — Patient Instructions (Signed)
Discharge instructions given. Handouts on polyps and diverticulosis. Resume previous medications. Follow up with Dr. Pricilla Holm in the [facility] and Dr. Ellin Saba in the Oncology Clinic. Resume previous medications. YOU HAD AN ENDOSCOPIC PROCEDURE TODAY AT THE [facility]:   Refer to the procedure report that was given to you for any specific questions about what was found during the examination.  If the procedure report does not answer your questions, please call your gastroenterologist to clarify.  If you requested that your care partner not be given the details of your procedure findings, then the procedure report has been included in a sealed envelope for you to review at your convenience later.  YOU SHOULD EXPECT: Some feelings of bloating in the abdomen. Passage of more gas than usual.  Walking can help get rid of the air that was put into your GI tract during the procedure and reduce the bloating. If you had a lower endoscopy (such as a colonoscopy or flexible sigmoidoscopy) you may notice spotting of blood in your stool or on the toilet paper. If you underwent a bowel prep for your procedure, you may not have a normal bowel movement for a few days.  Please Note:  You might notice some irritation and congestion in your nose or some drainage.  This is from the oxygen used during your procedure.  There is no need for concern and it should clear up in a day or so.  SYMPTOMS TO REPORT IMMEDIATELY:  Following lower endoscopy (colonoscopy or flexible sigmoidoscopy):  Excessive amounts of blood in the stool  Significant tenderness or worsening of abdominal pains  Swelling of the abdomen that is new, acute  Fever of 100F or higher   For urgent or emergent issues, a gastroenterologist can be reached at any hour by calling [phone]. Do not use MyChart messaging for urgent concerns.    DIET:  We do recommend a small meal at first, but then you may proceed to your  regular diet.  Drink plenty of fluids but you should avoid alcoholic beverages for 24 hours.  ACTIVITY:  You should plan to take it easy for the rest of today and you should NOT DRIVE or use heavy machinery until tomorrow (because of the sedation medicines used during the test).    FOLLOW UP: Our staff will call the number listed on your records the next business day following your procedure.  We will call around 7:15- 8:00 am to check on you and address any questions or concerns that you may have regarding the information given to you following your procedure. If we do not reach you, we will leave a message.     If any biopsies were taken you will be contacted by phone or by letter within the next 1-3 weeks.  Please call us at [phone] if you have not heard about the biopsies in 3 weeks.    SIGNATURES/CONFIDENTIALITY: You and/or your care partner have signed paperwork which will be entered into your electronic medical record.  These signatures attest to the fact that that the information above on your After Visit Summary has been reviewed and is understood.  Full responsibility of the confidentiality of this discharge information lies with you and/or your care-partner.

## 2022-09-07 NOTE — Progress Notes (Signed)
Called to room to assist during endoscopic procedure.  Patient ID and intended procedure confirmed with present staff. Received instructions for my participation in the procedure from the performing physician.  

## 2022-09-07 NOTE — Progress Notes (Signed)
GASTROENTEROLOGY PROCEDURE H&P NOTE   Primary Care Physician: Tower, Audrie Gallus, MD    Reason for Procedure:   Peritoneal carcinomatosis, abnormal CT findings  Plan:    Colonoscopy  Patient is appropriate for endoscopic procedure(s) in the ambulatory ([facility]) setting.  The nature of the procedure, as well as the risks, benefits, and alternatives were carefully and thoroughly reviewed with the patient. Ample time for discussion and questions allowed. The patient understood, was satisfied, and agreed to proceed.     HPI: Rachel Carlson is a 77 y.o. female who presents for colonoscopy for evaluation of peritoneal carcinomatosis .  Patient was most recently seen in the Gastroenterology Clinic on 08/13/2022.   Since then, has been evaluated by Dr. Pricilla Holm in the [facility] and Dr. Ellin Saba in the Oncology Clinic. CT on 7/25 with moderate ascites, probable omental caking in the left lower quadrant, possible small peritoneal implants seen in the pelvis. Diverticulosis of descending and sigmoid colon with moderate wall thickening of the distal sigmoid colon. Small bilateral pleural effusions. Underwent paracentesis on 08/31/2022 with 7L of milky ascites fluid removal and preliminary cytology c/ow peritoneal carcinomatosis.   Holding Eliquis for colonoscopy today.   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   Colonoscopy 10/12/2014 for diarrhea: Proctosigmoiditis confluent ulceration and mild friability to 25 cm from anal verge.  Diminutive polyp.  Diverticulosis in sigmoid colon.  Patient was put on Lialda 4.8 g daily and Cortenema   EGD 03/31/2013 for dysphagia: Stricture at GE junction-dilated to 20 mm.  5 cm hiatal hernia  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)    Esophageal stricture    Family history of breast cancer     Family history of colon cancer    Family history of melanoma    Family history of prostate cancer    Fibroids 1998   uterine, history of (left ovaries)   GERD (gastroesophageal reflux disease)    History of uterine prolapse    Hx of adenomatous polyp of colon 10/19/2014   Hyperlipidemia    Obesity    PVC (premature ventricular contraction)    first dx by holter in 1980's, echo (6/10) EF 55-60%, normal diastolic fxn, normal size RV and fxn, mild MR, PASP   Squamous cell carcinoma of arm    Tubal pregnancy    Ulcerative proctosigmoiditis (HCC)    Urge urinary incontinence 06/03/2007   Qualifier: Diagnosis of  By: Lindwood Qua CMA, Jerl Santos      Past Surgical History:  Procedure Laterality Date   BLADDER NECK SUSPENSION  1998   BREAST BIOPSY Left    BREAST LUMPECTOMY Left    benign   CATARACT EXTRACTION Right 01/2020   CATARACT EXTRACTION W/PHACO Left 08/12/2020   Procedure: CATARACT EXTRACTION PHACO AND INTRAOCULAR LENS PLACEMENT LEFT EYE;  Surgeon: Fabio Pierce, MD;  Location: [facility];  Service: Ophthalmology;  Laterality: Left;  left CDE=10.71   COLONOSCOPY  05/18/2011/10/12/14   Dr. Stan Head   ECTOPIC PREGNANCY SURGERY  1980   ESOPHAGOGASTRODUODENOSCOPY (EGD) WITH ESOPHAGEAL DILATION  04/03/2012   Dr. Stan Head   IR IMAGING GUIDED PORT INSERTION  09/06/2022   PARTIAL HYSTERECTOMY     abdominal, fibroids, and prolapse (1998) bladder tack   SKIN CANCER EXCISION     UPPER GASTROINTESTINAL ENDOSCOPY  Prior to Admission medications   Medication Sig Start Date End Date Taking? Authorizing Provider  Ascorbic Acid (VITAMIN C) 1000 MG tablet Take 1,000 mg by mouth daily.   Yes [provider]  calcium citrate-vitamin D (CITRACAL+D) 315-200 MG-UNIT per tablet Take 1 tablet by mouth 2 (two) times daily.   Yes [provider]  Cholecalciferol (VITAMIN D-3) 5000 UNITS TABS Take 5,000 Units by mouth daily.   Yes [provider]  Coenzyme Q10  (CO Q-10) 200 MG CAPS Take 200 mg by mouth daily.   Yes [provider]  CRANBERRY PO Take 650 mg by mouth daily.   Yes [provider]  estradiol (ESTRACE) 0.1 MG/GM vaginal cream Place 0.5 g vaginally 2 (two) times a week. 01/18/22  Yes Marguerita Beards, MD  Glucosamine-Chondroitin (GLUCOSAMINE CHONDR COMPLEX PO) Take 2 capsules by mouth daily.    Yes [provider]  GRAPE SEED EXTRACT PO Take 1 capsule by mouth daily.   Yes [provider]  lansoprazole (PREVACID) 15 MG capsule Take 1 capsule (15 mg total) by mouth 2 (two) times daily before a meal. Patient taking differently: Take 15 mg by mouth daily. Uses over the counter brand 09/02/17  Yes Iva Boop, MD  LIALDA 1.2 g EC tablet Take 2 tablets by mouth twice daily 07/10/22  Yes Iva Boop, MD  milk thistle 175 MG tablet Take 175 mg by mouth daily.   Yes [provider]  Omega-3 Fatty Acids (FISH OIL TRIPLE STRENGTH PO) Take 1 capsule by mouth daily.   Yes [provider]  Polyethyl Glycol-Propyl Glycol (SYSTANE OP) Place 1 drop into both eyes daily.   Yes [provider]  prochlorperazine (COMPAZINE) 10 MG tablet Take 1 tablet (10 mg total) by mouth every 6 (six) hours as needed for nausea or vomiting. 08/29/22  Yes Doreatha Massed, MD  rosuvastatin (CRESTOR) 20 MG tablet Take 1 tablet (20 mg total) by mouth at bedtime. 08/20/22  Yes Tower, Audrie Gallus, MD  vitamin B-12 (CYANOCOBALAMIN) 1000 MCG tablet Take 1,000 mcg by mouth daily.   Yes [provider]  zinc gluconate 50 MG tablet Take 50 mg by mouth daily.   Yes [provider]  apixaban (ELIQUIS) 5 MG TABS tablet Take 1 tablet (5 mg total) by mouth 2 (two) times daily. 08/20/22   Tower, Audrie Gallus, MD  hydrocortisone (ANUSOL-HC) 2.5 % rectal cream Place 1 Application rectally 2 (two) times daily. 10/13/21   Iva Boop, MD  hydrocortisone Laurance Flatten) 100 MG/60ML enema Use 60ml every night for 28  days 10/20/21   Unk Lightning, PA  ketoconazole (NIZORAL) 2 % cream Apply 1 Application topically daily. To yeast rash 08/17/21   Tower, Audrie Gallus, MD  lidocaine-prilocaine (EMLA) cream Use on vulva/ urethra as needed for pain. Patient not taking: Reported on 09/04/2022 04/06/22   Marguerita Beards, MD  meclizine (ANTIVERT) 25 MG tablet Take 1 tablet (25 mg total) by mouth 3 (three) times daily as needed for dizziness or nausea (vertigo). Patient not taking: Reported on 09/04/2022 01/03/22   Judy Pimple, MD    Current Outpatient Medications  Medication Sig Dispense Refill   Ascorbic Acid (VITAMIN C) 1000 MG tablet Take 1,000 mg by mouth daily.     calcium citrate-vitamin D (CITRACAL+D) 315-200 MG-UNIT per tablet Take 1 tablet by mouth 2 (two) times daily.     Cholecalciferol (VITAMIN D-3) 5000 UNITS TABS Take 5,000 Units by mouth daily.  Coenzyme Q10 (CO Q-10) 200 MG CAPS Take 200 mg by mouth daily.     CRANBERRY PO Take 650 mg by mouth daily.     estradiol (ESTRACE) 0.1 MG/GM vaginal cream Place 0.5 g vaginally 2 (two) times a week. 42.5 g 11   Glucosamine-Chondroitin (GLUCOSAMINE CHONDR COMPLEX PO) Take 2 capsules by mouth daily.      GRAPE SEED EXTRACT PO Take 1 capsule by mouth daily.     lansoprazole (PREVACID) 15 MG capsule Take 1 capsule (15 mg total) by mouth 2 (two) times daily before a meal. (Patient taking differently: Take 15 mg by mouth daily. Uses over the counter brand)     LIALDA 1.2 g EC tablet Take 2 tablets by mouth twice daily 120 tablet 2   milk thistle 175 MG tablet Take 175 mg by mouth daily.     Omega-3 Fatty Acids (FISH OIL TRIPLE STRENGTH PO) Take 1 capsule by mouth daily.     Polyethyl Glycol-Propyl Glycol (SYSTANE OP) Place 1 drop into both eyes daily.     prochlorperazine (COMPAZINE) 10 MG tablet Take 1 tablet (10 mg total) by mouth every 6 (six) hours as needed for nausea or vomiting. 90 tablet 3   rosuvastatin (CRESTOR) 20 MG tablet Take 1 tablet (20 mg  total) by mouth at bedtime. 90 tablet 3   vitamin B-12 (CYANOCOBALAMIN) 1000 MCG tablet Take 1,000 mcg by mouth daily.     zinc gluconate 50 MG tablet Take 50 mg by mouth daily.     apixaban (ELIQUIS) 5 MG TABS tablet Take 1 tablet (5 mg total) by mouth 2 (two) times daily. 60 tablet 2   hydrocortisone (ANUSOL-HC) 2.5 % rectal cream Place 1 Application rectally 2 (two) times daily. 30 g 1   hydrocortisone (CORTENEMA) 100 MG/60ML enema Use 60ml every night for 28 days 1680 mL 0   ketoconazole (NIZORAL) 2 % cream Apply 1 Application topically daily. To yeast rash 30 g 1   lidocaine-prilocaine (EMLA) cream Use on vulva/ urethra as needed for pain. (Patient not taking: Reported on 09/04/2022) 30 g 1   meclizine (ANTIVERT) 25 MG tablet Take 1 tablet (25 mg total) by mouth 3 (three) times daily as needed for dizziness or nausea (vertigo). (Patient not taking: Reported on 09/04/2022) 20 tablet 0   Current Facility-Administered Medications  Medication Dose Route Frequency Provider Last Rate Last Admin   0.9 %  sodium chloride infusion  500 mL Intravenous Once ,  V, DO        Allergies as of 09/07/2022 - Review Complete 09/07/2022  Allergen Reaction Noted   Barbiturates  09/05/2022   Pantoprazole sodium Diarrhea 03/03/2013   Ranitidine Diarrhea 03/03/2013   Omeprazole Diarrhea 06/05/2012   Sulfasalazine Rash 11/08/2014    Family History  Problem Relation Age of Onset   Sudden death Father 7       "coronary arteriosclerosis" on death certificate   Heart attack Mother 31   Transient ischemic attack Mother    Diabetes type II Mother    Sudden death Mother        died age 63   Diabetes Mother    Coronary artery disease Mother    Skin cancer Mother    Barrett's esophagus Mother    Arrhythmia Brother    Colon cancer Brother 58   Breast cancer Sister        dx in her 83s   Colon polyps Sister        adenomatous  Colon cancer Son 37       lynch syndrome   Melanoma Maternal  Uncle    Congestive Heart Failure Maternal Grandmother    Prostate cancer Maternal Grandfather        dx in 41s   Tuberculosis Paternal Grandfather    Skin cancer Maternal Aunt    Barrett's esophagus Maternal Aunt    Other Son        parotid gland tumor   Ehlers-Danlos syndrome Daughter    Colon cancer Cousin        mid 30s; maternal cousin   Colon cancer Other        MGMs brother   Esophageal cancer Neg Hx    Stomach cancer Neg Hx    Rectal cancer Neg Hx     Social History   Socioeconomic History   Marital status: Married    Spouse name: Not on file   Number of children: 3   Years of education: Not on file   Highest education level: Not on file  Occupational History   Occupation: International aid/development worker    Comment: retired 2009    Employer: unemployed  Tobacco Use   Smoking status: Never   Smokeless tobacco: Never  Vaping Use   Vaping status: Never Used  Substance and Sexual Activity   Alcohol use: No    Alcohol/week: 0.0 standard drinks of alcohol    Comment: rarely beer   Drug use: No   Sexual activity: Not Currently  Other Topics Concern   Not on file  Social History Narrative   Retired International aid/development worker, 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)   Grown children   Never smoker no alcohol tobacco or drug use   Social Determinants of Corporate investment banker Strain: Low Risk  (08/28/2022)   Overall Financial Resource Strain (CARDIA)    Difficulty of Paying Living Expenses: Not hard at all  Food Insecurity: No Food Insecurity (08/28/2022)   Hunger Vital Sign    Worried About Running Out of Food in the Last Year: Never true    Ran Out of Food in the Last Year: Never true  Transportation Needs: No Transportation Needs (08/28/2022)   PRAPARE - Administrator, Civil Service (Medical): No    Lack of Transportation (Non-Medical): No  Physical Activity: Inactive (08/28/2022)   Exercise Vital Sign    Days of Exercise per Week: 0 days    Minutes of Exercise per  Session: 0 min  Stress: No Stress Concern Present (08/28/2022)   [employer] of Occupational Health - Occupational Stress Questionnaire    Feeling of Stress : Not at all  Social Connections: Moderately Integrated (08/28/2022)   Social Connection and Isolation Panel [NHANES]    Frequency of Communication with Friends and Family: More than three times a week    Frequency of Social Gatherings with Friends and Family: More than three times a week    Attends Religious Services: Never    Database administrator or Organizations: Yes    Attends Engineer, structural: More than 4 times per year    Marital Status: Married  Catering manager Violence: Not At Risk (08/28/2022)   Humiliation, Afraid, Rape, and Kick questionnaire    Fear of Current or Ex-Partner: No    Emotionally Abused: No    Physically Abused: No    Sexually Abused: No    Physical Exam: Vital signs in last 24 hours: @BP  135/87   Pulse 87  Temp (!) 96.8 F (36 C) (Temporal)   Ht 5\' 1"  (1.549 m)   Wt 188 lb (85.3 kg)   LMP 05/29/1996   SpO2 96%   BMI 35.52 kg/m  GEN: NAD EYE: Sclerae anicteric ENT: MMM CV: Non-tachycardic Pulm: CTA b/l GI: Soft, NT/ND NEURO:  Alert & Oriented x 3   Doristine Locks, DO [facility]   09/07/2022 11:31 AM

## 2022-09-10 ENCOUNTER — Inpatient Hospital Stay: Payer: Medicare Other

## 2022-09-10 ENCOUNTER — Other Ambulatory Visit: Payer: Self-pay | Admitting: Oncology

## 2022-09-10 NOTE — Progress Notes (Signed)
Chemotherapy education packet given and discussed with pt in detail.  Discussed diagnosis, staging, tx regimen, and intent of tx.  Reviewed chemotherapy medications and side effects, as well as pre-medications.  Instructed on how to manage side effects at home, and when to call the clinic.  Importance of fever/chills discussed with pt. Discussed precautions to implement at home after receiving tx, as well as self care strategies. Phone numbers provided for clinic during regular working hours, also how to reach the clinic after hours and on weekends. Pt provided the opportunity to ask questions - all questions answered to pt's satisfaction.    

## 2022-09-10 NOTE — Patient Instructions (Addendum)
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 You will be treated in the clinic every 3 weeks with a combination of chemotherapy drugs. Those drugs are Taxol and carboplatin. You will receive a total of 6 cycles of treatment. The intent of treatment is cure. You will see the doctor regularly throughout treatment.  We will obtain blood work from you prior to every treatment and monitor your results to make sure it is safe to give your treatment. The doctor monitors your response to treatment by the way you are feeling, your blood work, and by obtaining scans periodically.  There will be wait times while you are here for treatment.  It will take about 30 minutes to 1 hour for your lab work to result.  Then there will be wait times while pharmacy mixes your medications.    Medications you will receive in the clinic prior to your chemotherapy medications:  Aloxi:  ALOXI is used in adults to help prevent nausea and vomiting that happens with certain chemotherapy drugs.  Aloxi is a long acting medication, and will remain in your system for about two days.   Emend:  This is an anti-nausea medication that is used with Aloxi to help prevent nausea and vomiting caused by chemotherapy.  Dexamethasone:  This is a steroid given prior to chemotherapy to help prevent allergic reactions; it may also help prevent and control nausea and diarrhea.   Pepcid:  This medication is a histamine blocker that helps prevent and allergic reaction to your chemotherapy.    Pepcid:  This medication is a histamine blocker that helps prevent and allergic reaction to your chemotherapy.    Benadryl:  This is a histamine blocker (different from the Pepcid) that helps prevent allergic/infusion reactions to your chemotherapy. This medication may cause dizziness/drowsiness.    Medication you will receive after each treatment:  Neulasta (or similar) - this medication  is not chemotherapy but is being given because you have had chemo. It is usually given 24-48 hours after the completion of chemotherapy. This medication works by boosting your bone marrow's supply of white blood cells. White blood cells are what protect our bodies against infection. The medication is given in the form of a subcutaneous injection. It is given in the fatty tissue of your abdomen, or in the skin on the back of your arm . It is a short needle. The major side effect of this medication is bone or muscle pain. The drug of choice to relieve or lessen the pain is Aleve or Ibuprofen. If a physician has ever told you not to take Aleve or Ibuprofen - then don't take it. You should then take Tylenol/acetaminophen. Take either medication as the bottle directs you to. The level of pain you experience as a result of this injection can range from none, to mild or moderate, or severe. Please let us know if you develop moderate or severe bone pain. You can take Claritin 10 mg over the counter for a few days after receiving Neulasta to help with the bone aches and pains.       Paclitaxel (Taxol)  About This Drug Paclitaxel is a drug used to treat cancer. It is given in the vein (IV). This will take 3 hours to infuse. This first infusion will take longer to infuse because it is increased slowly to monitor for reactions.  The nurse will be in the room with you for the first 15 minutes of the  first infusion.  Possible Side Effects   Hair loss. Hair loss is often temporary, although with certain medicine, hair loss can sometimes be permanent. Hair loss may happen suddenly or gradually. If you lose hair, you may lose it from your head, face, armpits, pubic area, chest, and/or legs. You may also notice your hair getting thin.   Swelling of your legs, ankles and/or feet (edema)   Flushing   Nausea and throwing up (vomiting)   Loose bowel movements (diarrhea)   Bone marrow depression. This is a decrease in  the number of white blood cells, red blood cells, and platelets. This may raise your risk of infection, make you tired and weak (fatigue), and raise your risk of bleeding.   Effects on the nerves are called peripheral neuropathy. You may feel numbness, tingling, or pain in your hands and feet. It may be hard for you to button your clothes, open jars, or walk as usual. The effect on the nerves may get worse with more doses of the drug. These effects get better in some people after the drug is stopped but it does not get better in all people.   Changes in your liver function   Bone, joint and muscle pain   Abnormal EKG   Allergic reaction: Allergic reactions, including anaphylaxis are rare but may happen in some patients. Signs of allergic reaction to this drug may be swelling of the face, feeling like your tongue or throat are swelling, trouble breathing, rash, itching, fever, chills, feeling dizzy, and/or feeling that your heart is beating in a fast or not normal way. If this happens, do not take another dose of this drug. You should get urgent medical treatment.   Infection   Changes in your kidney function.  Note: Each of the side effects above was reported in 20% or greater of patients treated with paclitaxel. Not all possible side effects are included above.  Warnings and Precautions   Severe allergic reactions   Severe bone marrow depression  Treating Side Effects   To help with hair loss, wash with a mild shampoo and avoid washing your hair every day.   Avoid rubbing your scalp, instead, pat your hair or scalp dry   Avoid coloring your hair   Limit your use of hair spray, electric curlers, blow dryers, and curling irons.   If you are interested in getting a wig, talk to your nurse. You can also call the [employer] at 800-ACS-2345 to find out information about the "Look Good, Feel Better" program close to where you live. It is a free program where women getting  chemotherapy can learn about wigs, turbans and scarves as well as makeup techniques and skin and nail care.   Ask your doctor or nurse about medicines that are available to help stop or lessen diarrhea and/or nausea.   To help with nausea and vomiting, eat small, frequent meals instead of three large meals a day. Choose foods and drinks that are at room temperature. Ask your nurse or doctor about other helpful tips and medicine that is available to help or stop lessen these symptoms.   If you get diarrhea, eat low-fiber foods that are high in protein and calories and avoid foods that can irritate your digestive tracts or lead to cramping. Ask your nurse or doctor about medicine that can lessen or stop your diarrhea.   Mouth care is very important. Your mouth care should consist of routine, gentle cleaning of your teeth or  dentures and rinsing your mouth with a mixture of 1/2 teaspoon of salt in 8 ounces of water or  teaspoon of baking soda in 8 ounces of water. This should be done at least after each meal and at bedtime.   If you have mouth sores, avoid mouthwash that has alcohol. Also avoid alcohol and smoking because they can bother your mouth and throat.   Drink plenty of fluids (a minimum of eight glasses per day is recommended).   Take your temperature as your doctor or nurse tells you, and whenever you feel like you may have a fever.   Talk to your doctor or nurse about precautions you can take to avoid infections and bleeding.   Be careful when cooking, walking, and handling sharp objects and hot liquids.  Food and Drug Interactions   There are no known interactions of paclitaxel with food.   This drug may interact with other medicines. Tell your doctor and pharmacist about all the medicines and dietary supplements (vitamins, minerals, herbs and others) that you are taking at this time.   The safety and use of dietary supplements and alternative diets are often not known. Using  these might affect your cancer or interfere with your treatment. Until more is known, you should not use dietary supplements or alternative diets without your cancer doctor's help.  When to Call the Doctor  Call your doctor or nurse if you have any of the following symptoms and/or any new or unusual symptoms:   Fever of 100.4 F (38 C) or above   Chills   Redness, pain, warmth, or swelling at the IV site during the infusion   Signs of allergic reaction: swelling of the face, feeling like your tongue or throat are swelling, trouble breathing, rash, itching, fever, chills, feeling dizzy, and/or feeling that your heart is beating in a fast or not normal way   Feeling that your heart is beating in a fast or not normal way (palpitations)   Weight gain of 5 pounds in one week (fluid retention)   Decreased urine or very dark urine   Signs of liver problems: dark urine, pale bowel movements, bad stomach pain, feeling very tired and weak, unusual  itching, or yellowing of the eyes or skin   Heavy menstrual period that lasts longer than normal   Easy bruising or bleeding   Nausea that stops you from eating or drinking, and/or that is not relieved by prescribed medicines.   Loose bowel movements (diarrhea) more than 4 times a day or diarrhea with weakness or lightheadedness   Pain in your mouth or throat that makes it hard to eat or drink   Lasting loss of appetite or rapid weight loss of five pounds in a week   Signs of peripheral neuropathy: numbness, tingling, or decreased feeling in fingers or toes; trouble walking or changes in the way you walk; or feeling clumsy when buttoning clothes, opening jars, or other routine activities   Joint and muscle pain that is not relieved by prescribed medicines   Extreme fatigue that interferes with normal activities   While you are getting this drug, please tell your nurse right away if you have any pain, redness, or swelling at the site of the  IV infusion.   If you think you are pregnant.  Reproduction Warnings   Pregnancy warning: This drug may have harmful effects on the unborn child, it is recommended that effective methods of birth control should be used during your cancer  treatment. Let your doctor know right away if you think you may be pregnant.   Breast feeding warning: Women should not breast feed during treatment because this drug could enter the breastmilk and cause harm to a breast feeding baby.   Carboplatin (Paraplatin, CBDCA)  About This Drug  Carboplatin is used to treat cancer. It is given in the vein (IV).  It will take 30 minutes to infuse.   Possible Side Effects   Bone marrow suppression. This is a decrease in the number of white blood cells, red blood cells, and platelets. This may raise your risk of infection, make you tired and weak (fatigue), and raise your risk of bleeding.   Nausea and vomiting (throwing up)   Weakness   Changes in your liver function   Changes in your kidney function   Electrolyte changes   Pain  Note: Each of the side effects above was reported in 20% or greater of patients treated with carboplatin. Not all possible side effects are included above.   Warnings and Precautions   Severe bone marrow suppression   Allergic reactions, including anaphylaxis are rare but may happen in some patients. Signs of allergic reaction to this drug may be swelling of the face, feeling like your tongue or throat are swelling, trouble breathing, rash, itching, fever, chills, feeling dizzy, and/or feeling that your heart is beating in a fast or not normal way. If this happens, do not take another dose of this drug. You should get urgent medical treatment.   Severe nausea and vomiting   Effects on the nerves are called peripheral neuropathy. This risk is increased if you are over the age of 35 or if you have received other medicine with risk of peripheral neuropathy. You may feel  numbness, tingling, or pain in your hands and feet. It may be hard for you to button your clothes, open jars, or walk as usual. The effect on the nerves may get worse with more doses of the drug. These effects get better in some people after the drug is stopped but it does not get better in all people.   Blurred vision, loss of vision or other changes in eyesight   Decreased hearing   - Skin and tissue irritation including redness, pain, warmth, or swelling at the IV site if the drug leaks out of the vein and into nearby tissue.   Severe changes in your kidney function, which can cause kidney failure   Severe changes in your liver function, which can cause liver failure  Note: Some of the side effects above are very rare. If you have concerns and/or questions, please discuss them with your medical team.   Important Information   This drug may be present in the saliva, tears, sweat, urine, stool, vomit, semen, and vaginal secretions. Talk to your doctor and/or your nurse about the necessary precautions to take during this time.   Treating Side Effects   Manage tiredness by pacing your activities for the day.   Be sure to include periods of rest between energy-draining activities.   To decrease the risk of infection, wash your hands regularly.   Avoid close contact with people who have a cold, the flu, or other infections.   Take your temperature as your doctor or nurse tells you, and whenever you feel like you may have a fever.   To help decrease the risk of bleeding, use a soft toothbrush. Check with your nurse before using dental floss.   Be  very careful when using knives or tools.   Use an electric shaver instead of a razor.   Drink plenty of fluids (a minimum of eight glasses per day is recommended).   If you throw up or have loose bowel movements, you should drink more fluids so that you do not become dehydrated (lack of water in the body from losing too much fluid).    To help with nausea and vomiting, eat small, frequent meals instead of three large meals a day. Choose foods and drinks that are at room temperature. Ask your nurse or doctor about other helpful tips and medicine that is available to help stop or lessen these symptoms.   If you have numbness and tingling in your hands and feet, be careful when cooking, walking, and handling sharp objects and hot liquids.   Keeping your pain under control is important to your well-being. Please tell your doctor or nurse if you are experiencing pain.   Food and Drug Interactions   There are no known interactions of carboplatin with food.   This drug may interact with other medicines. Tell your doctor and pharmacist about all the prescription and over-the-counter medicines and dietary supplements (vitamins, minerals, herbs and others) that you are taking at this time. Also, check with your doctor or pharmacist before starting any new prescription or over-the-counter medicines, or dietary supplements to make sure that there are no interactions.   When to Call the Doctor  Call your doctor or nurse if you have any of these symptoms and/or any new or unusual symptoms:   Fever of 100.4 F (38 C) or higher   Chills   Tiredness that interferes with your daily activities   Feeling dizzy or lightheaded   Easy bleeding or bruising   Nausea that stops you from eating or drinking and/or is not relieved by prescribed medicines   Throwing up   Blurred vision or other changes in eyesight   Decrease in hearing or ringing in the ear   Signs of allergic reaction: swelling of the face, feeling like your tongue or throat are swelling, trouble breathing, rash, itching, fever, chills, feeling dizzy, and/or feeling that your heart is beating in a fast or not normal way. If this happens, call 911 for emergency care.   Signs of possible liver problems: dark urine, pale bowel movements, bad stomach pain, feeling very tired  and weak, unusual itching, or yellowing of the eyes or skin   Decreased urine, or very dark urine   Numbness, tingling, or pain in your hands and feet   Pain that does not go away or is not relieved by prescribed medicine   While you are getting this drug, please tell your nurse right away if you have any pain, redness, or swelling at the site of the IV infusion, or if you have any new onset of symptoms, or if you just feel "different" from before when the infusion was started.   Reproduction Warnings   Pregnancy warning: This drug may have harmful effects on the unborn baby. Women of child bearing potential should use effective methods of birth control during your cancer treatment. Let your doctor know right away if you think you may be pregnant.   Breastfeeding warning: It is not known if this drug passes into breast milk. For this reason, women should not breastfeed during treatment because this drug could enter the breast milk and cause harm to a breastfeeding baby.   Fertility warning: Human fertility studies have  not been done with this drug. Talk with your doctor or nurse if you plan to have children. Ask for information on sperm or egg banking.  SELF CARE ACTIVITIES WHILE ON CHEMOTHERAPY/IMMUNOTHERAPY:  Hydration Increase your fluid intake and drink at least 64 ounces (2 liters) of water/decaffeinated beverages per day after treatment. You can still have your cup of coffee or soda but these beverages do not count as part of the 64 ounces that you need to drink daily. Limit alcohol intake.  Medications Continue taking your normal prescription medication as prescribed.  If you start any new herbal or new supplements please let us know first to make sure it is safe.  Mouth Care Have teeth cleaned professionally before starting treatment. Keep dentures and partial plates clean. Use soft toothbrush and do not use mouthwashes that contain alcohol. Biotene is a good mouthwash that is  available at most pharmacies or may be ordered by calling [phone]. Use warm salt water gargles (1 teaspoon salt per 1 quart warm water) before and after meals and at bedtime. If you are still having problems with your mouth or sores in your mouth please call the clinic. If you need dental work, please let the doctor know before you go for your appointment so that we can coordinate the best possible time for you in regards to your chemo regimen. You need to also let your dentist know that you are actively taking chemo. We may need to do labs prior to your dental appointment.  Skin Care Always use sunscreen that has not expired and with SPF (Sun Protection Factor) of 50 or higher. Wear hats to protect your head from the sun. Remember to use sunscreen on your hands, ears, face, & feet.  Use good moisturizing lotions such as udder cream, eucerin, or even Vaseline. Some chemotherapies can cause dry skin, color changes in your skin and nails.    Avoid long, hot showers or baths. Use gentle, fragrance-free soaps and laundry detergent. Use moisturizers, preferably creams or ointments rather than lotions because the thicker consistency is better at preventing skin dehydration. Apply the cream or ointment within 15 minutes of showering. Reapply moisturizer at night, and moisturize your hands every time after you wash them.   Infection Prevention Please wash your hands for at least 30 seconds using warm soapy water. Handwashing is the #1 way to prevent the spread of germs. Stay away from sick people or people who are getting over a cold. If you develop respiratory systems such as green/yellow mucus production or productive cough or persistent cough let us know and we will see if you need an antibiotic. It is a good idea to keep a pair of gloves on when going into grocery stores/[employer] to decrease your risk of coming into contact with germs on the carts, etc. Carry alcohol hand gel with you at all times and  use it frequently if out in public. If your temperature reaches 100.5 or higher please call the clinic and let us know.  If it is after hours or on the weekend please go to the ER if your temperature is over 100.4.  Please have your own personal thermometer at home to use.    Sex and bodily fluids If you are going to have sex, a condom must be used to protect the person that isn't taking immunotherapy. For a few days after treatment, immunotherapy can be excreted through your bodily fluids.  When using the toilet please close the lid and  flush the toilet twice.  Do this for a few day after you have had immunotherapy.   Contraception It is not known for sure whether or not immunotherapy drugs can be passed on through semen or secretions from the vagina. Because of this some doctors advise people to use a barrier method if you have sex during treatment. This applies to vaginal, anal or oral sex.  Generally, doctors advise a barrier method only for the time you are actually having the treatment and for about a week after your treatment.  Advice like this can be worrying, but this does not mean that you have to avoid being intimate with your partner. You can still have close contact with your partner and continue to enjoy sex.  Animals If you have cats or birds we ask that you not change the litter or change the cage.  Please have someone else do this for you while you are on immunotherapy.   Food Safety During and After Cancer Treatment Food safety is important for people both during and after cancer treatment. Cancer and cancer treatments, such as chemotherapy, radiation therapy, and stem cell/bone marrow transplantation, often weaken the immune system. This makes it harder for your body to protect itself from foodborne illness, also called food poisoning. Foodborne illness is caused by eating food that contains harmful bacteria, parasites, or viruses.  Foods to avoid Some foods have a higher risk  of becoming tainted with bacteria. These include: Unwashed fresh fruit and vegetables, especially leafy vegetables that can hide dirt and other contaminants Raw sprouts, such as alfalfa sprouts Raw or undercooked beef, especially ground beef, or other raw or undercooked meat and poultry Fatty, fried, or spicy foods immediately before or after treatment.  These can sit heavy on your stomach and make you feel nauseous. Raw or undercooked shellfish, such as oysters. Sushi and sashimi, which often contain raw fish.  Unpasteurized beverages, such as unpasteurized fruit juices, raw milk, raw yogurt, or cider Undercooked eggs, such as soft boiled, over easy, and poached; raw, unpasteurized eggs; or foods made with raw egg, such as homemade raw cookie dough and homemade mayonnaise  Simple steps for food safety  Shop smart. Do not buy food stored or displayed in an unclean area. Do not buy bruised or damaged fruits or vegetables. Do not buy cans that have cracks, dents, or bulges. Pick up foods that can spoil at the end of your shopping trip and store them in a cooler on the way home.  Prepare and clean up foods carefully. Rinse all fresh fruits and vegetables under running water, and dry them with a clean towel or paper towel. Clean the top of cans before opening them. After preparing food, wash your hands for 20 seconds with hot water and soap. Pay special attention to areas between fingers and under nails. Clean your utensils and dishes with hot water and soap. Disinfect your kitchen and cutting boards using 1 teaspoon of liquid, unscented bleach mixed into 1 quart of water.    Dispose of old food. Eat canned and packaged food before its expiration date (the "use by" or "best before" date). Consume refrigerated leftovers within 3 to 4 days. After that time, throw out the food. Even if the food does not smell or look spoiled, it still may be unsafe. Some bacteria, such as Listeria, can grow even  on foods stored in the refrigerator if they are kept for too long.  Take precautions when eating out. At restaurants, avoid  buffets and salad bars where food sits out for a long time and comes in contact with many people. Food can become contaminated when someone with a virus, often a norovirus, or another "bug" handles it. Put any leftover food in a "to-go" container yourself, rather than having the server do it. And, refrigerate leftovers as soon as you get home. Choose restaurants that are clean and that are willing to prepare your food as you order it cooked.    SYMPTOMS TO REPORT AS SOON AS POSSIBLE AFTER TREATMENT:  FEVER GREATER THAN 100.4 F CHILLS WITH OR WITHOUT FEVER NAUSEA AND VOMITING THAT IS NOT CONTROLLED WITH YOUR NAUSEA MEDICATION UNUSUAL SHORTNESS OF BREATH UNUSUAL BRUISING OR BLEEDING TENDERNESS IN MOUTH AND THROAT WITH OR WITHOUT PRESENCE OF ULCERS URINARY PROBLEMS BOWEL PROBLEMS UNUSUAL RASH     Wear comfortable clothing and clothing appropriate for easy access to any Portacath or PICC line. Let us know if there is anything that we can do to make your therapy better!   What to do if you need assistance after hours or on the weekends: CALL [phone].  HOLD on the line, do not hang up.  You will hear multiple messages but at the end you will be connected with a nurse triage line.  They will contact the doctor if necessary.  Most of the time they will be able to assist you.  Do not call the hospital operator.    I have been informed and understand all of the instructions given to me and have received a copy. I have been instructed to call the clinic [phone] or my family physician as soon as possible for continued medical care, if indicated. I do not have any more questions at this time but understand that I may call the Cancer Center or the Patient Navigator at [phone] during office hours should I have questions or need assistance in obtaining follow-up  care.

## 2022-09-10 NOTE — Progress Notes (Signed)
Gynecologic Oncology Multi-Disciplinary Disposition Conference Note  Date of the Conference: 09/10/2022  Patient Name: Rachel Carlson  Referring Provider: Dr. Elaina Pattee Primary GYN Oncologist: Dr. 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 Disposition is to neoadjuvant chemotherapy followed by an interval debulking surgery then additional chemotherapy..   This Multidisciplinary conference took place involving physicians from Gynecologic Oncology, Medical Oncology, Radiation Oncology, Pathology, Radiology along with the Gynecologic Oncology Nurse Practitioner and Gynecologic Oncology Nurse Navigator.  Comprehensive assessment of the patient's malignancy, staging, need for surgery, chemotherapy, radiation therapy, and need for further testing were reviewed. Supportive measures, both inpatient and following discharge were also discussed. The recommended plan of care is documented. Greater than 35 minutes were spent correlating and coordinating this patient's care.

## 2022-09-11 ENCOUNTER — Telehealth: Payer: Self-pay | Admitting: *Deleted

## 2022-09-11 NOTE — Telephone Encounter (Signed)
Left message on f/u call 

## 2022-09-12 ENCOUNTER — Inpatient Hospital Stay: Payer: Medicare Other

## 2022-09-12 VITALS — BP 137/69 | HR 106 | Temp 98.1°F | Resp 20 | Wt 192.6 lb

## 2022-09-12 VITALS — BP 139/75 | HR 88 | Temp 98.5°F | Resp 18

## 2022-09-12 DIAGNOSIS — R971 Elevated cancer antigen 125 [CA 125]: Secondary | ICD-10-CM | POA: Diagnosis not present

## 2022-09-12 DIAGNOSIS — C579 Malignant neoplasm of female genital organ, unspecified: Secondary | ICD-10-CM | POA: Diagnosis not present

## 2022-09-12 DIAGNOSIS — C800 Disseminated malignant neoplasm, unspecified: Secondary | ICD-10-CM

## 2022-09-12 DIAGNOSIS — Z79899 Other long term (current) drug therapy: Secondary | ICD-10-CM | POA: Diagnosis not present

## 2022-09-12 DIAGNOSIS — K660 Peritoneal adhesions (postprocedural) (postinfection): Secondary | ICD-10-CM

## 2022-09-12 DIAGNOSIS — Z7901 Long term (current) use of anticoagulants: Secondary | ICD-10-CM | POA: Diagnosis not present

## 2022-09-12 DIAGNOSIS — K222 Esophageal obstruction: Secondary | ICD-10-CM | POA: Diagnosis not present

## 2022-09-12 DIAGNOSIS — I82409 Acute embolism and thrombosis of unspecified deep veins of unspecified lower extremity: Secondary | ICD-10-CM | POA: Diagnosis not present

## 2022-09-12 DIAGNOSIS — K5130 Ulcerative (chronic) rectosigmoiditis without complications: Secondary | ICD-10-CM | POA: Diagnosis not present

## 2022-09-12 DIAGNOSIS — Z808 Family history of malignant neoplasm of other organs or systems: Secondary | ICD-10-CM | POA: Diagnosis not present

## 2022-09-12 DIAGNOSIS — Z800 Family history of malignant neoplasm of digestive organs: Secondary | ICD-10-CM | POA: Diagnosis not present

## 2022-09-12 DIAGNOSIS — K219 Gastro-esophageal reflux disease without esophagitis: Secondary | ICD-10-CM | POA: Diagnosis not present

## 2022-09-12 DIAGNOSIS — E785 Hyperlipidemia, unspecified: Secondary | ICD-10-CM | POA: Diagnosis not present

## 2022-09-12 DIAGNOSIS — R188 Other ascites: Secondary | ICD-10-CM | POA: Diagnosis not present

## 2022-09-12 LAB — COMPREHENSIVE METABOLIC PANEL
ALT: 11 U/L (ref 0–44)
AST: 17 U/L (ref 15–41)
Albumin: 2.8 g/dL — ABNORMAL LOW (ref 3.5–5.0)
Alkaline Phosphatase: 50 U/L (ref 38–126)
Anion gap: 10 (ref 5–15)
BUN: 8 mg/dL (ref 8–23)
CO2: 21 mmol/L — ABNORMAL LOW (ref 22–32)
Calcium: 8.4 mg/dL — ABNORMAL LOW (ref 8.9–10.3)
Chloride: 105 mmol/L (ref 98–111)
Creatinine, Ser: 0.62 mg/dL (ref 0.44–1.00)
GFR, Estimated: >60 mL/min (ref >60)
Glucose, Bld: 150 mg/dL — ABNORMAL HIGH (ref 70–99)
Potassium: 3.7 mmol/L (ref 3.5–5.1)
Sodium: 136 mmol/L (ref 135–145)
Total Bilirubin: 0.5 mg/dL (ref 0.3–1.2)
Total Protein: 5.8 g/dL — ABNORMAL LOW (ref 6.5–8.1)

## 2022-09-12 LAB — CBC WITH DIFFERENTIAL/PLATELET
Abs Immature Granulocytes: 0.01 10*3/uL (ref 0.00–0.07)
Basophils Absolute: 0 10*3/uL (ref 0.0–0.1)
Basophils Relative: 1 %
Eosinophils Absolute: 0.1 10*3/uL (ref 0.0–0.5)
Eosinophils Relative: 2 %
HCT: 37.5 % (ref 36.0–46.0)
Hemoglobin: 12.1 g/dL (ref 12.0–15.0)
Immature Granulocytes: 0 %
Lymphocytes Relative: 14 %
Lymphs Abs: 0.9 10*3/uL (ref 0.7–4.0)
MCH: 27.7 pg (ref 26.0–34.0)
MCHC: 32.3 g/dL (ref 30.0–36.0)
MCV: 85.8 fL (ref 80.0–100.0)
Monocytes Absolute: 0.5 10*3/uL (ref 0.1–1.0)
Monocytes Relative: 8 %
Neutro Abs: 4.7 10*3/uL (ref 1.7–7.7)
Neutrophils Relative %: 75 %
Platelets: 299 10*3/uL (ref 150–400)
RBC: 4.37 MIL/uL (ref 3.87–5.11)
RDW: 15.2 % (ref 11.5–15.5)
WBC: 6.2 10*3/uL (ref 4.0–10.5)
nRBC: 0 % (ref 0.0–0.2)

## 2022-09-12 LAB — MAGNESIUM: Magnesium: 1.9 mg/dL (ref 1.7–2.4)

## 2022-09-12 MED ORDER — SODIUM CHLORIDE 0.9 % IV SOLN: Freq: Once | INTRAVENOUS | Status: AC

## 2022-09-12 MED ORDER — SODIUM CHLORIDE 0.9 % IV SOLN
150.0000 mg | Freq: Once | INTRAVENOUS | Status: AC
  Administered 2022-09-12: 150 mg via INTRAVENOUS
  Filled 2022-09-12: qty 150

## 2022-09-12 MED ORDER — FAMOTIDINE IN NACL 20-0.9 MG/50ML-% IV SOLN
20.0000 mg | Freq: Once | INTRAVENOUS | Status: AC
  Administered 2022-09-12: 20 mg via INTRAVENOUS
  Filled 2022-09-12: qty 50

## 2022-09-12 MED ORDER — ONDANSETRON HCL 8 MG PO TABS: 8.0000 mg | ORAL_TABLET | Freq: Three times a day (TID) | ORAL | 1 refills | Status: DC | PRN

## 2022-09-12 MED ORDER — SODIUM CHLORIDE 0.9 % IV SOLN
10.0000 mg | Freq: Once | INTRAVENOUS | Status: AC
  Administered 2022-09-12: 10 mg via INTRAVENOUS
  Filled 2022-09-12: qty 10

## 2022-09-12 MED ORDER — LIDOCAINE-PRILOCAINE 2.5-2.5 % EX CREA
TOPICAL_CREAM | CUTANEOUS | 3 refills | Status: DC
Start: 2022-09-12 — End: 2023-04-22

## 2022-09-12 MED ORDER — SODIUM CHLORIDE 0.9% FLUSH
10.0000 mL | INTRAVENOUS | Status: DC | PRN
  Administered 2022-09-12: 10 mL

## 2022-09-12 MED ORDER — CETIRIZINE HCL 10 MG/ML IV SOLN
10.0000 mg | Freq: Once | INTRAVENOUS | Status: AC
  Administered 2022-09-12: 10 mg via INTRAVENOUS
  Filled 2022-09-12: qty 1

## 2022-09-12 MED ORDER — DIPHENHYDRAMINE HCL 50 MG/ML IJ SOLN: 50.0000 mg | Freq: Once | INTRAMUSCULAR | Status: DC

## 2022-09-12 MED ORDER — PALONOSETRON HCL INJECTION 0.25 MG/5ML
0.2500 mg | Freq: Once | INTRAVENOUS | Status: AC
  Administered 2022-09-12: 0.25 mg via INTRAVENOUS
  Filled 2022-09-12: qty 5

## 2022-09-12 MED ORDER — HEPARIN SOD (PORK) LOCK FLUSH 100 UNIT/ML IV SOLN
500.0000 [IU] | Freq: Once | INTRAVENOUS | Status: AC | PRN
  Administered 2022-09-12: 500 [IU]

## 2022-09-12 MED ORDER — SODIUM CHLORIDE 0.9 % IV SOLN
175.0000 mg/m2 | Freq: Once | INTRAVENOUS | Status: AC
  Administered 2022-09-12: 336 mg via INTRAVENOUS
  Filled 2022-09-12: qty 56

## 2022-09-12 MED ORDER — SODIUM CHLORIDE 0.9 % IV SOLN
545.4000 mg | Freq: Once | INTRAVENOUS | Status: AC
  Administered 2022-09-12: 550 mg via INTRAVENOUS
  Filled 2022-09-12: qty 55

## 2022-09-12 NOTE — Patient Instructions (Signed)
[facility]  Discharge Instructions: Thank you for choosing [facility] to provide your oncology and hematology care.  If you have a lab appointment with the Cancer Center - please note that after April 8th, 2024, all labs will be drawn in the cancer center.  You do not have to check in or register with the main entrance as you have in the past but will complete your check-in in the cancer center.  Wear comfortable clothing and clothing appropriate for easy access to any Portacath or PICC line.   We strive to give you quality time with your provider. You may need to reschedule your appointment if you arrive late (15 or more minutes).  Arriving late affects you and other patients whose appointments are after yours.  Also, if you miss three or more appointments without notifying the office, you may be dismissed from the clinic at the provider's discretion.      For prescription refill requests, have your pharmacy contact our office and allow 72 hours for refills to be completed.    Today you received the following chemotherapy and/or immunotherapy agents Taxol/Carboplatin      To help prevent nausea and vomiting after your treatment, we encourage you to take your nausea medication as directed.  BELOW ARE SYMPTOMS THAT SHOULD BE REPORTED IMMEDIATELY: *FEVER GREATER THAN 100.4 F (38 C) OR HIGHER *CHILLS OR SWEATING *NAUSEA AND VOMITING THAT IS NOT CONTROLLED WITH YOUR NAUSEA MEDICATION *UNUSUAL SHORTNESS OF BREATH *UNUSUAL BRUISING OR BLEEDING *URINARY PROBLEMS (pain or burning when urinating, or frequent urination) *BOWEL PROBLEMS (unusual diarrhea, constipation, pain near the anus) TENDERNESS IN MOUTH AND THROAT WITH OR WITHOUT PRESENCE OF ULCERS (sore throat, sores in mouth, or a toothache) UNUSUAL RASH, SWELLING OR PAIN  UNUSUAL VAGINAL DISCHARGE OR ITCHING   Items with * indicate a potential emergency and should be followed up as soon as possible or go  to the Emergency Department if any problems should occur.  Please show the CHEMOTHERAPY ALERT CARD or IMMUNOTHERAPY ALERT CARD at check-in to the Emergency Department and triage nurse.  Should you have questions after your visit or need to cancel or reschedule your appointment, please contact [facility] [phone]  and follow the prompts.  Office hours are 8:00 a.m. to 4:30 p.m. Monday - Friday. Please note that voicemails left after 4:00 p.m. may not be returned until the following business day.  We are closed weekends and major holidays. You have access to a nurse at all times for urgent questions. Please call the main number to the clinic [phone] and follow the prompts.  For any non-urgent questions, you may also contact your provider using MyChart. We now offer e-Visits for anyone 36 and older to request care online for non-urgent symptoms. For details visit [email or web address].   Also download the MyChart app! Go to the app store, search "MyChart", open the app, select [facility], and log in with your MyChart username and password.

## 2022-09-12 NOTE — Progress Notes (Signed)
Pharmacist Chemotherapy Monitoring - Initial Assessment    Anticipated start date: 09/12/22   The following has been reviewed per standard work regarding the patient's treatment regimen: The patient's diagnosis, treatment plan and drug doses, and organ/hematologic function Lab orders and baseline tests specific to treatment regimen  The treatment plan start date, drug sequencing, and pre-medications Prior authorization status  Patient's documented medication list, including drug-drug interaction screen and prescriptions for anti-emetics and supportive care specific to the treatment regimen The drug concentrations, fluid compatibility, administration routes, and timing of the medications to be used The patient's access for treatment and lifetime cumulative dose history, if applicable  The patient's medication allergies and previous infusion related reactions, if applicable   Changes made to treatment plan:  pre-medications cetirizine  Follow up needed:  N/A   Stephens Shire, [facility], 09/12/2022  8:41 AM

## 2022-09-12 NOTE — Addendum Note (Signed)
Addended by: Doreatha Massed on: 09/12/2022 08:23 AM   Modules accepted: Orders

## 2022-09-12 NOTE — Progress Notes (Signed)
Patient presents today for D1C1 Taxol and Carboplatin per providers order.  Vital signs and labs within parameters for treatment.  Patient has no new complaints at this time.  Treatment given today per MD orders.  Stable during infusion without adverse affects.  Vital signs stable.  No complaints at this time.  Discharge from clinic ambulatory in stable condition.  Alert and oriented X 3.  Follow up with [facility] as scheduled.

## 2022-09-13 ENCOUNTER — Inpatient Hospital Stay: Payer: Medicare Other | Admitting: Licensed Clinical Social Worker

## 2022-09-13 ENCOUNTER — Telehealth: Payer: Self-pay | Admitting: *Deleted

## 2022-09-13 DIAGNOSIS — C800 Disseminated malignant neoplasm, unspecified: Secondary | ICD-10-CM

## 2022-09-13 NOTE — Telephone Encounter (Signed)
24 hour called placed today. Pt denies any side effects at this time. Pt advised to call the clinic if needed, pt verbalized understanding.

## 2022-09-13 NOTE — Progress Notes (Signed)
[facility] Clinical Social Work  Initial Assessment   Rachel Carlson is a 77 y.o. year old female contacted by phone. Clinical Social Work was referred by medical provider for assessment of psychosocial needs.   SDOH (Social Determinants of Health) assessments performed: Yes SDOH Interventions    Flowsheet Row Clinical Support from 08/28/2022 in [facility] Clinical Support from 08/25/2021 in [facility] Clinical Support from 08/25/2020 in [facility] Clinical Support from 08/25/2019 in [facility] [place]  SDOH Interventions      Food Insecurity Interventions Intervention Not Indicated Intervention Not Indicated -- --  Housing Interventions Intervention Not Indicated Intervention Not Indicated -- --  Transportation Interventions Intervention Not Indicated Intervention Not Indicated -- --  Utilities Interventions Intervention Not Indicated -- -- --  Alcohol Usage Interventions Intervention Not Indicated (Score <7), Patient Declined -- -- --  Depression Interventions/Treatment  -- -- PHQ2-9 Score <4 Follow-up Not Indicated PHQ2-9 Score <4 Follow-up Not Indicated  Financial Strain Interventions Intervention Not Indicated Intervention Not Indicated -- --  Physical Activity Interventions Intervention Not Indicated, Patient Declined Intervention Not Indicated -- --  Stress Interventions Intervention Not Indicated Intervention Not Indicated -- --  Social Connections Interventions Intervention Not Indicated, Patient Declined Intervention Not Indicated -- --  Health Literacy Interventions Intervention Not Indicated -- -- --       SDOH Screenings   Food Insecurity: No Food Insecurity (08/28/2022)  Housing: Low Risk  (08/28/2022)  Transportation Needs: No Transportation Needs (08/28/2022)  Utilities: Not At Risk (08/28/2022)  Alcohol Screen: Low Risk  (08/28/2022)   Depression (PHQ2-9): Low Risk  (08/28/2022)  Financial Resource Strain: Low Risk  (08/28/2022)  Physical Activity: Inactive (08/28/2022)  Social Connections: Moderately Integrated (08/28/2022)  Stress: No Stress Concern Present (08/28/2022)  Tobacco Use: Low Risk  (09/07/2022)  Health Literacy: Adequate Health Literacy (08/28/2022)     Distress Screen completed: No     No data to display            Family/Social Information:  Housing Arrangement: patient lives with her husband for whom pt is the primary caretaker and her youngest son.  Pt's husband has a diagnosis of PLS.  He reportedly is ambulatory w/ a walker, but needs assistance if he is walking. Family members/support persons in your life? Pt states her son is her sole support.  Pt's son works the early morning shift leaving the house at 2:00am and returning around 2:30pm.  He has been taking FMLA to accommodate transportation to doctor's appointments.  Pt has 2 other children, but they both reside out of state. 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  Income source: Actor concerns: No Type of concern: None Food access concerns: no Religious or spiritual practice: Software engineer Currently in place:  none  Coping/ Adjustment to diagnosis: Patient understands treatment plan and what happens next? yes Concerns about diagnosis and/or treatment: How I will care for other members of my family, How will I care for myself, and Quality of life Patient reported stressors: Anxiety/ nervousness and Adjusting to my illness Hopes and/or priorities: pt's priority is to continue treatment w/ the hope of positive results. Patient enjoys being outside Current coping skills/ strengths: Ability for insight , Average or above average intelligence , Capable of independent living ,  Communication skills , General fund of knowledge , Motivation for treatment/growth , and Physical Health     SUMMARY: Current SDOH Barriers:  No barriers identified at this time.  Clinical Social Work Clinical Goal(s):  No clinical social work goals at this time  Interventions: Discussed common feeling and emotions when being diagnosed with cancer, and the importance of support during treatment Informed patient of the support team roles and support services at [facility] Provided CSW contact information and encouraged patient to call with any questions or concerns Referred patient to Marijean Niemann to connect w/ support.   Follow Up Plan: Patient will contact CSW with any support or resource needs Patient verbalizes understanding of plan: Yes    Rachel Moulds, LCSW Clinical Social Worker [facility]

## 2022-09-14 ENCOUNTER — Inpatient Hospital Stay: Payer: Medicare Other

## 2022-09-14 ENCOUNTER — Encounter: Payer: Medicare Other | Admitting: Internal Medicine

## 2022-09-14 VITALS — BP 128/72 | HR 94 | Temp 97.5°F | Resp 20

## 2022-09-14 DIAGNOSIS — R971 Elevated cancer antigen 125 [CA 125]: Secondary | ICD-10-CM | POA: Diagnosis not present

## 2022-09-14 DIAGNOSIS — Z79899 Other long term (current) drug therapy: Secondary | ICD-10-CM | POA: Diagnosis not present

## 2022-09-14 DIAGNOSIS — C800 Disseminated malignant neoplasm, unspecified: Secondary | ICD-10-CM

## 2022-09-14 DIAGNOSIS — Z7901 Long term (current) use of anticoagulants: Secondary | ICD-10-CM | POA: Diagnosis not present

## 2022-09-14 DIAGNOSIS — C579 Malignant neoplasm of female genital organ, unspecified: Secondary | ICD-10-CM | POA: Diagnosis not present

## 2022-09-14 DIAGNOSIS — R188 Other ascites: Secondary | ICD-10-CM | POA: Diagnosis not present

## 2022-09-14 DIAGNOSIS — Z800 Family history of malignant neoplasm of digestive organs: Secondary | ICD-10-CM | POA: Diagnosis not present

## 2022-09-14 DIAGNOSIS — K222 Esophageal obstruction: Secondary | ICD-10-CM | POA: Diagnosis not present

## 2022-09-14 DIAGNOSIS — Z808 Family history of malignant neoplasm of other organs or systems: Secondary | ICD-10-CM | POA: Diagnosis not present

## 2022-09-14 DIAGNOSIS — E785 Hyperlipidemia, unspecified: Secondary | ICD-10-CM | POA: Diagnosis not present

## 2022-09-14 DIAGNOSIS — K5130 Ulcerative (chronic) rectosigmoiditis without complications: Secondary | ICD-10-CM | POA: Diagnosis not present

## 2022-09-14 DIAGNOSIS — K219 Gastro-esophageal reflux disease without esophagitis: Secondary | ICD-10-CM | POA: Diagnosis not present

## 2022-09-14 DIAGNOSIS — I82409 Acute embolism and thrombosis of unspecified deep veins of unspecified lower extremity: Secondary | ICD-10-CM | POA: Diagnosis not present

## 2022-09-14 MED ORDER — PEGFILGRASTIM-CBQV 6 MG/0.6ML ~~LOC~~ SOSY
6.0000 mg | PREFILLED_SYRINGE | Freq: Once | SUBCUTANEOUS | Status: AC
  Administered 2022-09-14: 6 mg via SUBCUTANEOUS
  Filled 2022-09-14: qty 0.6

## 2022-09-17 ENCOUNTER — Inpatient Hospital Stay: Payer: Medicare Other | Admitting: Dietician

## 2022-09-17 ENCOUNTER — Other Ambulatory Visit: Payer: Self-pay | Admitting: *Deleted

## 2022-09-17 ENCOUNTER — Telehealth: Payer: Self-pay | Admitting: *Deleted

## 2022-09-17 DIAGNOSIS — K660 Peritoneal adhesions (postprocedural) (postinfection): Secondary | ICD-10-CM

## 2022-09-17 DIAGNOSIS — C800 Disseminated malignant neoplasm, unspecified: Secondary | ICD-10-CM

## 2022-09-17 NOTE — Telephone Encounter (Signed)
Patient had called after hours nurse line c/o abdominal pain and distention.  Has needed paracentesis in the past and feels it may be necessary at this time.  Per Fr. Ellin Saba, order placed for STAT paracentesis today at [facility], Will be done tomorrow at [facility].  Will need to be there at 930.

## 2022-09-18 ENCOUNTER — Ambulatory Visit (HOSPITAL_COMMUNITY)
Admission: RE | Admit: 2022-09-18 | Discharge: 2022-09-18 | Disposition: A | Discharge status: Home / Self Care | Payer: Medicare Other | Source: Ambulatory Visit | Attending: Hematology | Admitting: Hematology

## 2022-09-18 DIAGNOSIS — K660 Peritoneal adhesions (postprocedural) (postinfection): Secondary | ICD-10-CM | POA: Diagnosis not present

## 2022-09-18 DIAGNOSIS — C800 Disseminated malignant neoplasm, unspecified: Secondary | ICD-10-CM | POA: Diagnosis not present

## 2022-09-18 DIAGNOSIS — R188 Other ascites: Secondary | ICD-10-CM | POA: Diagnosis not present

## 2022-09-18 MED ORDER — LIDOCAINE HCL 1 % IJ SOLN
INTRAMUSCULAR | Status: AC
  Filled 2022-09-18: qty 20

## 2022-09-18 NOTE — Procedures (Signed)
Ultrasound-guided therapeutic paracentesis performed yielding 3.4 liters of chylous/milky colored fluid. No immediate complications.EBL none.

## 2022-09-23 NOTE — Progress Notes (Signed)
[facility] [address], [place] [address]    Clinic Day:  09/24/2022  Referring physician: Tower, Audrie Gallus, MD  Patient Care Team: Tower, Audrie Gallus, MD as PCP - General Doreatha Massed, MD as Medical Oncologist (Medical Oncology) Therese Sarah, RN as Oncology Nurse Navigator (Medical Oncology)   ASSESSMENT & PLAN:   Assessment: 1.  Peritoneal carcinomatosis/ovarian cancer: - Presentation: 67-month history of abdominal distention and bloating. - Last colonoscopy (01/01/2020): No evidence of malignancy. - CTAP (08/23/2022): Moderate ascites, probable omental caking in the left lower quadrant, possible small peritoneal implants seen in the pelvis.  Diverticulosis of descending and sigmoid colon with moderate wall thickening of the distal sigmoid colon.  Small bilateral pleural effusions. - 08/26/2022: CA125: 7356, CEA: 1 - Germline mutation testing (09/16/2017): Negative. - Cycle 1 of carboplatin and paclitaxel on 09/12/2019   2. Social/Family History: -She lives at home with husband and is his primary caregiver. She is independent ADLS and iADLS. Retired International aid/development worker. No tobacco use.  -She had skin squamous cell carcinoma. Her brother had colon cancer at 26 and follicular non-Hodgkin's lymphoma and DLBCL, her maternal grandfather died of prostate cancer, maternal first cousin had colon cancer, and maternal aunt died of lung cancer (smoker).     Plan: 1.  Peritoneal carcinomatosis/ovarian cancer: - She had cycle 1 of chemotherapy on 09/12/2019. - She had severe body pains after G-CSF injection lasting about 4 days.  Tylenol did not help.  She took Claritin but still had pains.  Pains were predominantly in the legs and pelvic girdle.  Denied any nausea or vomiting. - Last paracentesis on 09/18/2022 with 3.4 L removed. - Reviewed labs today: Normal LFTs.  Albumin improved to 3.2.  CBC grossly normal with elevated white count 14.5. - She will proceed with cycle 2  on 10/03/2022.  I will discontinue G-CSF because of pains.  We will see her back with cycle 3 on 10/24/2022.  Will arrange for CTAP with contrast after cycle 3 to evaluate response.  Will also check CA125 prior to each cycle.   2.  Intermittent nausea: - Continue Compazine 5 mg every 6 hours as needed.   3.  Left gastrocnemius/calf vein distal DVT (Dx 07/24/2022): - Continue Eliquis twice daily.  No bleeding issues.  4.  Peripheral neuropathy: - She reported new onset constant tingling/numbness in the fingertips of first 4 fingers of both hands, started 1 to 2 days after cycle 1 of chemotherapy.  She also reported funny feeling in her feet.  No neuropathic pains. - Will decrease paclitaxel dose by 20%.    Orders Placed This Encounter  Procedures   Magnesium    Standing Status:   Future    Standing Expiration Date:   12/15/2023   CA 125    Standing Status:   Future    Standing Expiration Date:   12/15/2023   CBC with Differential    Standing Status:   Future    Standing Expiration Date:   12/15/2023   Comprehensive metabolic panel    Standing Status:   Future    Standing Expiration Date:   12/15/2023   Magnesium    Standing Status:   Future    Standing Expiration Date:   01/05/2024   CA 125    Standing Status:   Future    Standing Expiration Date:   01/05/2024   CBC with Differential    Standing Status:   Future    Standing Expiration Date:  01/05/2024   Comprehensive metabolic panel    Standing Status:   Future    Standing Expiration Date:   01/05/2024      I,Katie Daubenspeck,acting as a scribe for Doreatha Massed, MD.,have documented all relevant documentation on the behalf of Doreatha Massed, MD,as directed by  Doreatha Massed, MD while in the presence of Doreatha Massed, MD.   I, Doreatha Massed MD, have reviewed the above documentation for accuracy and completeness, and I agree with the above.   Doreatha Massed, MD   8/26/20244:50 PM  CHIEF  COMPLAINT:   Diagnosis: Peritoneal carcinomatosis/ovarian cancer    Cancer Staging  No matching staging information was found for the patient.    Prior Therapy: none  Current Therapy:  neoadjuvant carboplatin and paclitaxel   HISTORY OF PRESENT ILLNESS:   Oncology History  Carcinomatosis (HCC)  08/31/2022 Initial Diagnosis   Carcinomatosis (HCC)   09/12/2022 -  Chemotherapy   Patient is on Treatment Plan : OVARIAN Carboplatin (AUC 6) + Paclitaxel (175) q21d X 6 Cycles        INTERVAL HISTORY:   Rachel Carlson is a 77 y.o. female presenting to clinic today for follow up of Peritoneal carcinomatosis/ovarian cancer . She was last seen by me on 09/04/22.  Since her last visit, she had port placed on 09/06/22.  She also underwent colonoscopy on 09/07/22 under Dr. Barron Alvine. 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  She also underwent therapeutic paracentesis on 09/18/22 yielding 3.4 L of fluid.  Today, she states that she is doing well overall. Her appetite level is at 65%. Her energy level is at 60%.  PAST MEDICAL HISTORY:   Past Medical History: 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)    Esophageal stricture    Family history of breast cancer    Family history of colon cancer    Family history of melanoma    Family history of prostate cancer    Fibroids 1998   uterine, history of (left ovaries)   GERD (gastroesophageal reflux disease)    History of uterine prolapse    Hx of adenomatous polyp of colon 10/19/2014   Hyperlipidemia    Obesity    PVC (premature ventricular contraction)    first dx by holter in 1980's, echo (6/10) EF 55-60%, normal diastolic fxn, normal size RV and fxn, mild MR, PASP    Squamous cell carcinoma of arm    Tubal pregnancy    Ulcerative proctosigmoiditis (HCC)    Urge urinary incontinence 06/03/2007   Qualifier: Diagnosis of  By: Lindwood Qua CMA, Jerl Santos      Surgical History: Past Surgical History:  Procedure Laterality Date   BLADDER NECK SUSPENSION  1998   BREAST BIOPSY Left    BREAST LUMPECTOMY Left    benign   CATARACT EXTRACTION Right 01/2020   CATARACT EXTRACTION W/PHACO Left 08/12/2020   Procedure: CATARACT EXTRACTION PHACO AND INTRAOCULAR LENS PLACEMENT LEFT EYE;  Surgeon: Fabio Pierce, MD;  Location: [facility];  Service: Ophthalmology;  Laterality: Left;  left CDE=10.71   COLONOSCOPY  05/18/2011/10/12/14   Dr. Stan Head   ECTOPIC PREGNANCY SURGERY  1980   ESOPHAGOGASTRODUODENOSCOPY (EGD) WITH ESOPHAGEAL DILATION  04/03/2012   Dr. Stan Head   IR IMAGING GUIDED PORT INSERTION  09/06/2022   PARTIAL HYSTERECTOMY     abdominal, fibroids, and prolapse (1998) bladder tack   SKIN CANCER EXCISION     UPPER GASTROINTESTINAL ENDOSCOPY      Social History: Social History   Socioeconomic History   Marital status: Married    Spouse name: Not on file   Number of children: 3   Years of education: Not on file   Highest education level: Not on file  Occupational History   Occupation: International aid/development worker    Comment: retired 2009    Employer: unemployed  Tobacco Use   Smoking status: Never   Smokeless tobacco: Never  Vaping Use   Vaping status: Never Used  Substance and Sexual Activity   Alcohol use: No    Alcohol/week: 0.0 standard drinks of alcohol    Comment: rarely beer   Drug use: No   Sexual activity: Not Currently  Other Topics Concern   Not on file  Social History Narrative   Retired International aid/development worker, 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)   Grown children   Never smoker no alcohol tobacco or drug use   Social Determinants of Corporate investment banker Strain: Low Risk  (08/28/2022)   Overall Financial Resource Strain  (CARDIA)    Difficulty of Paying Living Expenses: Not hard at all  Food Insecurity: No Food Insecurity (08/28/2022)   Hunger Vital Sign    Worried About Running Out of Food in the Last Year: Never true    Ran Out of Food in the Last Year: Never true  Transportation Needs: No Transportation Needs (08/28/2022)   PRAPARE - Administrator, Civil Service (Medical): No    Lack of Transportation (Non-Medical): No  Physical Activity: Inactive (08/28/2022)   Exercise Vital Sign    Days of Exercise per Week: 0 days    Minutes of Exercise per Session: 0 min  Stress: No Stress Concern Present (08/28/2022)   [employer] of Occupational Health - Occupational Stress Questionnaire    Feeling of Stress : Not at all  Social Connections: Moderately Integrated (08/28/2022)   Social Connection and Isolation Panel [NHANES]    Frequency of Communication with Friends and Family: More than three times a week    Frequency of Social Gatherings with Friends and Family: More than three times a week    Attends Religious Services: Never    Database administrator or Organizations: Yes    Attends Engineer, structural: More than 4 times per year    Marital Status: Married  Catering manager Violence: Not At Risk (08/28/2022)   Humiliation, Afraid, Rape, and Kick questionnaire    Fear of Current or Ex-Partner: No    Emotionally Abused: No    Physically Abused: No    Sexually Abused: No    Family History: Family History  Problem Relation Age of Onset   Sudden death Father 74       "coronary arteriosclerosis" on death certificate   Heart attack Mother 45   Transient ischemic attack Mother    Diabetes type II Mother    Sudden death Mother        died age 77   Diabetes Mother    Coronary artery disease Mother    Skin cancer Mother    Barrett's esophagus Mother    Arrhythmia Brother    Colon cancer Brother 74   Breast cancer Sister        dx in her 80s   Colon polyps Sister  adenomatous   Colon cancer Son 37       lynch syndrome   Melanoma Maternal Uncle    Congestive Heart Failure Maternal Grandmother    Prostate cancer Maternal Grandfather        dx in 96s   Tuberculosis Paternal Grandfather    Skin cancer Maternal Aunt    Barrett's esophagus Maternal Aunt    Other Son        parotid gland tumor   Ehlers-Danlos syndrome Daughter    Colon cancer Cousin        mid 26s; maternal cousin   Colon cancer Other        MGMs brother   Esophageal cancer Neg Hx    Stomach cancer Neg Hx    Rectal cancer Neg Hx     Current Medications:  Current Outpatient Medications:    apixaban (ELIQUIS) 5 MG TABS tablet, Take 1 tablet (5 mg total) by mouth 2 (two) times daily., Disp: 60 tablet, Rfl: 2   Ascorbic Acid (VITAMIN C) 1000 MG tablet, Take 1,000 mg by mouth daily., Disp: , Rfl:    calcium citrate-vitamin D (CITRACAL+D) 315-200 MG-UNIT per tablet, Take 1 tablet by mouth 2 (two) times daily., Disp: , Rfl:    CARBOPLATIN IV, Inject into the vein every 21 ( twenty-one) days., Disp: , Rfl:    Cholecalciferol (VITAMIN D-3) 5000 UNITS TABS, Take 5,000 Units by mouth daily., Disp: , Rfl:    Coenzyme Q10 (CO Q-10) 200 MG CAPS, Take 200 mg by mouth daily., Disp: , Rfl:    CRANBERRY PO, Take 650 mg by mouth daily., Disp: , Rfl:    estradiol (ESTRACE) 0.1 MG/GM vaginal cream, Place 0.5 g vaginally 2 (two) times a week., Disp: 42.5 g, Rfl: 11   Glucosamine-Chondroitin (GLUCOSAMINE CHONDR COMPLEX PO), Take 2 capsules by mouth daily. , Disp: , Rfl:    GRAPE SEED EXTRACT PO, Take 1 capsule by mouth daily., Disp: , Rfl:    hydrocortisone (ANUSOL-HC) 2.5 % rectal cream, Place 1 Application rectally 2 (two) times daily., Disp: 30 g, Rfl: 1   hydrocortisone (CORTENEMA) 100 MG/60ML enema, Use 60ml every night for 28 days, Disp: 1680 mL, Rfl: 0   ketoconazole (NIZORAL) 2 % cream, Apply 1 Application topically daily. To yeast rash, Disp: 30 g, Rfl: 1   lansoprazole (PREVACID) 15 MG  capsule, Take 1 capsule (15 mg total) by mouth 2 (two) times daily before a meal. (Patient taking differently: Take 15 mg by mouth daily. Uses over the counter brand), Disp: , Rfl:    LIALDA 1.2 g EC tablet, Take 2 tablets by mouth twice daily, Disp: 120 tablet, Rfl: 2   lidocaine-prilocaine (EMLA) cream, Use on vulva/ urethra as needed for pain., Disp: 30 g, Rfl: 1   lidocaine-prilocaine (EMLA) cream, Apply to affected area once, Disp: 30 g, Rfl: 3   meclizine (ANTIVERT) 25 MG tablet, Take 1 tablet (25 mg total) by mouth 3 (three) times daily as needed for dizziness or nausea (vertigo)., Disp: 20 tablet, Rfl: 0   milk thistle 175 MG tablet, Take 175 mg by mouth daily., Disp: , Rfl:    Omega-3 Fatty Acids (FISH OIL TRIPLE STRENGTH PO), Take 1 capsule by mouth daily., Disp: , Rfl:    ondansetron (ZOFRAN) 8 MG tablet, Take 1 tablet (8 mg total) by mouth every 8 (eight) hours as needed for nausea or vomiting. Start on the third day after carboplatin., Disp: 30 tablet, Rfl: 1   PACLITAXEL IV, Inject  into the vein every 21 ( twenty-one) days., Disp: , Rfl:    Polyethyl Glycol-Propyl Glycol (SYSTANE OP), Place 1 drop into both eyes daily., Disp: , Rfl:    prochlorperazine (COMPAZINE) 10 MG tablet, Take 1 tablet (10 mg total) by mouth every 6 (six) hours as needed for nausea or vomiting., Disp: 90 tablet, Rfl: 3   rosuvastatin (CRESTOR) 20 MG tablet, Take 1 tablet (20 mg total) by mouth at bedtime., Disp: 90 tablet, Rfl: 3   vitamin B-12 (CYANOCOBALAMIN) 1000 MCG tablet, Take 1,000 mcg by mouth daily., Disp: , Rfl:    zinc gluconate 50 MG tablet, Take 50 mg by mouth daily., Disp: , Rfl:    Allergies: Allergies  Allergen Reactions   Pantoprazole Sodium Diarrhea    Abdominal pain   Ranitidine Diarrhea    abdominal pain   Omeprazole Diarrhea    Abdominal pain   Sulfasalazine Rash    Fever, chills, headache, muscle pain    REVIEW OF SYSTEMS:   Review of Systems  Constitutional:  Negative for  chills, fatigue and fever.  HENT:   Negative for lump/mass, mouth sores, nosebleeds, sore throat and trouble swallowing.   Eyes:  Negative for eye problems.  Respiratory:  Negative for cough and shortness of breath.   Cardiovascular:  Negative for chest pain, leg swelling and palpitations.  Gastrointestinal:  Positive for constipation and diarrhea. Negative for abdominal pain, nausea and vomiting.  Genitourinary:  Negative for bladder incontinence, difficulty urinating, dysuria, frequency, hematuria and nocturia.   Musculoskeletal:  Positive for back pain. Negative for arthralgias, flank pain, myalgias and neck pain.  Skin:  Negative for itching and rash.  Neurological:  Positive for numbness. Negative for dizziness and headaches.  Hematological:  Does not bruise/bleed easily.  Psychiatric/Behavioral:  Negative for depression, sleep disturbance and suicidal ideas. The patient is not nervous/anxious.   All other systems reviewed and are negative.    VITALS:   Last menstrual period 05/29/1996.  Wt Readings from Last 3 Encounters:  09/24/22 175 lb 3.2 oz (79.5 kg)  09/12/22 192 lb 9.6 oz (87.4 kg)  09/07/22 188 lb (85.3 kg)    There is no height or weight on file to calculate BMI.  Performance status (ECOG): 1 - Symptomatic but completely ambulatory  PHYSICAL EXAM:   Physical Exam Vitals and nursing note reviewed. Exam conducted with a chaperone present.  Constitutional:      Appearance: Normal appearance.  Cardiovascular:     Rate and Rhythm: Normal rate and regular rhythm.     Pulses: Normal pulses.     Heart sounds: Normal heart sounds.  Pulmonary:     Effort: Pulmonary effort is normal.     Breath sounds: Normal breath sounds.  Abdominal:     Palpations: Abdomen is soft. There is no hepatomegaly, splenomegaly or mass.     Tenderness: There is no abdominal tenderness.  Musculoskeletal:     Right lower leg: No edema.     Left lower leg: No edema.  Lymphadenopathy:      Cervical: No cervical adenopathy.     Right cervical: No superficial, deep or posterior cervical adenopathy.    Left cervical: No superficial, deep or posterior cervical adenopathy.     Upper Body:     Right upper body: No supraclavicular or axillary adenopathy.     Left upper body: No supraclavicular or axillary adenopathy.  Neurological:     General: No focal deficit present.     Mental Status: She is  alert and oriented to person, place, and time.  Psychiatric:        Mood and Affect: Mood normal.        Behavior: Behavior normal.     LABS:      Latest Ref Rng & Units 09/24/2022    8:02 AM 09/12/2022    7:51 AM 08/26/2022   12:52 PM  CBC  WBC 4.0 - 10.5 K/uL 14.5  6.2  6.9   Hemoglobin 12.0 - 15.0 g/dL 65.7  84.6  96.2   Hematocrit 36.0 - 46.0 % 38.6  37.5  39.1   Platelets 150 - 400 K/uL 210  299  350       Latest Ref Rng & Units 09/24/2022    8:02 AM 09/12/2022    7:51 AM 08/26/2022   12:52 PM  CMP  Glucose 70 - 99 mg/dL 952  841  93   BUN 8 - 23 mg/dL 8  8  8    Creatinine 0.44 - 1.00 mg/dL 3.24  4.01  0.27   Sodium 135 - 145 mmol/L 136  136  133   Potassium 3.5 - 5.1 mmol/L 3.7  3.7  4.0   Chloride 98 - 111 mmol/L 105  105  102   CO2 22 - 32 mmol/L 23  21  27    Calcium 8.9 - 10.3 mg/dL 8.7  8.4  8.8   Total Protein 6.5 - 8.1 g/dL 6.2  5.8  6.6   Total Bilirubin 0.3 - 1.2 mg/dL 0.6  0.5  0.9   Alkaline Phos 38 - 126 U/L 99  50  58   AST 15 - 41 U/L 16  17  18    ALT 0 - 44 U/L 14  11  12       Lab Results  Component Value Date   CEA1 1.0 08/26/2022   /  CEA  Date Value Ref Range Status  08/26/2022 1.0 0.0 - 4.7 ng/mL Final    Comment:    (NOTE)                             Nonsmokers          <3.9                             Smokers             <5.6 Roche [employer] Electrochemiluminescence Immunoassay (ECLIA) Values obtained with different assay methods or kits cannot be used interchangeably.  Results cannot be interpreted as absolute evidence of the  presence or absence of malignant disease. Performed At: [facility] [address] [place], [place] [id number] Jolene Schimke MD [id number]    No results found for: "PSA1" No results found for: "IEP329" Lab Results  Component Value Date   JJO841 7,356.0 (H) 08/26/2022    No results found for: "TOTALPROTELP", "ALBUMINELP", "A1GS", "A2GS", "BETS", "BETA2SER", "GAMS", "MSPIKE", "SPEI" No results found for: "TIBC", "FERRITIN", "IRONPCTSAT" No results found for: "LDH"   STUDIES:   US Paracentesis  Result Date: 09/18/2022 INDICATION: Patient with history of ovarian cancer/peritoneal carcinomatosis, recurrent ascites. Request received for therapeutic paracentesis. EXAM: ULTRASOUND GUIDED THERAPEUTIC  PARACENTESIS MEDICATIONS: 8 mL 1% lidocaine COMPLICATIONS: None immediate. PROCEDURE: Informed written consent was obtained from the patient after a discussion of the risks, benefits and alternatives to treatment. A timeout was performed prior to the initiation of the procedure. Initial ultrasound  scanning demonstrates a moderate to large amount of ascites within the right lower abdominal quadrant. The right lower abdomen was prepped and draped in the usual sterile fashion. 1% lidocaine was used for local anesthesia. Following this, a 19 gauge, 7-cm, Yueh catheter was introduced. An ultrasound image was saved for documentation purposes. The paracentesis was performed. The catheter was removed and a dressing was applied. The patient tolerated the procedure well without immediate post procedural complication. FINDINGS: A total of approximately 3.4 liters of chylous/milky fluid was removed. IMPRESSION: Successful ultrasound-guided therapeutic paracentesis yielding 3.4 liters of peritoneal fluid. Performed by: Artemio Aly Electronically Signed   By: Irish Lack M.D.   On: 09/18/2022 11:31   IR IMAGING GUIDED PORT INSERTION  Result Date: 09/06/2022 INDICATION: chemotherapy  adminstration EXAM: IMPLANTED PORT A CATH PLACEMENT WITH ULTRASOUND AND FLUOROSCOPIC GUIDANCE MEDICATIONS: None ANESTHESIA/SEDATION: Moderate (conscious) sedation was employed during this procedure. A total of Versed 1.5 mg and Fentanyl 50 mcg was administered intravenously. Moderate Sedation Time: 23 minutes. The patient's level of consciousness and vital signs were monitored continuously by radiology nursing throughout the procedure under my direct supervision. FLUOROSCOPY TIME:  Fluoroscopic dose; 4 mGy COMPLICATIONS: None immediate. PROCEDURE: The procedure, risks, benefits, and alternatives were explained to the patient. Questions regarding the procedure were encouraged and answered. The patient understands and consents to the procedure. The RIGHT neck and chest were prepped with chlorhexidine in a sterile fashion, and a sterile drape was applied covering the operative field. Maximum barrier sterile technique with sterile gowns and gloves were used for the procedure. A timeout was performed prior to the initiation of the procedure. Local anesthesia was provided with 1% lidocaine with epinephrine. After creating a small venotomy incision, a micropuncture kit was utilized to access the internal jugular vein under direct, real-time ultrasound guidance. Ultrasound image documentation was performed. The microwire was kinked to measure appropriate catheter length. A subcutaneous port pocket was then created along the upper chest wall utilizing a combination of sharp and blunt dissection. The pocket was irrigated with sterile saline. A single lumen Non-ISP power injectable port was chosen for placement. The 8 Fr catheter was tunneled from the port pocket site to the venotomy incision. The port was placed in the pocket. The external catheter was trimmed to appropriate length. At the venotomy, an 8 Fr peel-away sheath was placed over a guidewire under fluoroscopic guidance. The catheter was then placed through the  sheath and the sheath was removed. Final catheter positioning was confirmed and documented with a fluoroscopic spot radiograph. The port was accessed with a Huber needle, aspirated and flushed with heparinized saline. The port pocket incision was closed with interrupted 3-0 Vicryl suture then Dermabond was applied, including at the venotomy incision. Dressings were placed. The patient tolerated the procedure well without immediate post procedural complication. IMPRESSION: Successful placement of a RIGHT internal jugular approach power injectable Port-A-Cath. The tip of the catheter is positioned at the superior cavo-atrial junction. The catheter is ready for immediate use. Roanna Banning, MD [facility] Electronically Signed   By: Roanna Banning M.D.   On: 09/06/2022 12:31   CT Chest W Contrast  Result Date: 09/05/2022 CLINICAL DATA:  Staging ovarian cancer.  * Tracking Code: BO * EXAM: CT CHEST WITH CONTRAST TECHNIQUE: Multidetector CT imaging of the chest was performed during intravenous contrast administration. RADIATION DOSE REDUCTION: This exam was performed according to the departmental dose-optimization program which includes automated exposure control, adjustment of the mA  and/or kV according to patient size and/or use of iterative reconstruction technique. CONTRAST:  75mL OMNIPAQUE IOHEXOL 300 MG/ML  SOLN COMPARISON:  CT high-resolution 04/13/2018. Separate abdomen pelvis CT 08/23/2018 FINDINGS: Cardiovascular: Trace pericardial fluid. Heart is nonenlarged. Coronary artery calcifications are seen. The thoracic aorta has a normal course and caliber with scattered atherosclerotic calcified plaque. There is a bovine type aortic arch, normal variant. Mediastinum/Nodes: Small thyroid gland. Slightly patulous esophagus. There is moderate to large hiatal hernia. There is also fluid tracking along the hernia sac in the mediastinum. No specific abnormal lymph  node enlargement identified in the axillary region, hilum or mediastinum. Calcified hilar lymph nodes are identified. Lungs/Pleura: Small bilateral pleural effusions are seen, left-greater-than-right with some adjacent opacities. Favor atelectasis. There are some areas of atelectasis well in the middle lobe. Diffuse breathing motion. No dominant lung mass. Calcified nodule in the right lower lobe anteromedial on series 4, image 51 consistent with old granulomatous disease. Upper Abdomen: Diffuse ascites identified in the upper abdomen. Please correlate with prior CT scan Musculoskeletal: Scattered moderate degenerative changes of the spine. IMPRESSION: Small pleural effusions are seen with the adjacent opacities, left-greater-than-right. No developing mass lesion or lymph node enlargement in the thorax. Evidence of old granulomatous disease. Large hiatal hernia. There also fluid tracking into the mediastinum from the abdominal size along the hernia sac. Ascites in the upper abdomen.  Please correlate with prior CT scan. Aortic Atherosclerosis (ICD10-I70.0). Electronically Signed   By: Karen Kays M.D.   On: 09/05/2022 12:39   US Paracentesis  Result Date: 08/31/2022 INDICATION: 77 year old female history of peritoneal carcinomatosis and ovarian cancer. Request is for therapeutic and diagnostic paracentesis. EXAM: ULTRASOUND GUIDED THERAPEUTIC AND DIAGNOSTIC PARACENTESIS MEDICATIONS: Lidocaine 1% 10 mL COMPLICATIONS: None immediate. PROCEDURE: Informed written consent was obtained from the patient after a discussion of the risks, benefits and alternatives to treatment. A timeout was performed prior to the initiation of the procedure. Initial ultrasound scanning demonstrates a large amount of ascites within the right lower abdominal quadrant. The right lower abdomen was prepped and draped in the usual sterile fashion. 1% lidocaine was used for local anesthesia. Following this, a 19 gauge, 7-cm, Yueh catheter was  introduced. An ultrasound image was saved for documentation purposes. The paracentesis was performed. The catheter was removed and a dressing was applied. The patient tolerated the procedure well without immediate post procedural complication. Patient received post-procedure intravenous albumin; see nursing notes for details. FINDINGS: A total of approximately 7 L of milky yellow fluid was removed. Samples were sent to the laboratory as requested by the clinical team. IMPRESSION: Successful ultrasound-guided therapeutic and diagnostic paracentesis yielding 7 liters of peritoneal fluid. Read by: Anders Grant, NP Electronically Signed   By: Marliss Coots M.D.   On: 08/31/2022 13:25

## 2022-09-24 ENCOUNTER — Inpatient Hospital Stay: Payer: Medicare Other | Admitting: Dietician

## 2022-09-24 ENCOUNTER — Inpatient Hospital Stay: Payer: Medicare Other | Admitting: Hematology

## 2022-09-24 ENCOUNTER — Inpatient Hospital Stay: Payer: Medicare Other

## 2022-09-24 DIAGNOSIS — Z79899 Other long term (current) drug therapy: Secondary | ICD-10-CM | POA: Diagnosis not present

## 2022-09-24 DIAGNOSIS — R188 Other ascites: Secondary | ICD-10-CM | POA: Diagnosis not present

## 2022-09-24 DIAGNOSIS — R971 Elevated cancer antigen 125 [CA 125]: Secondary | ICD-10-CM | POA: Diagnosis not present

## 2022-09-24 DIAGNOSIS — K219 Gastro-esophageal reflux disease without esophagitis: Secondary | ICD-10-CM | POA: Diagnosis not present

## 2022-09-24 DIAGNOSIS — I82409 Acute embolism and thrombosis of unspecified deep veins of unspecified lower extremity: Secondary | ICD-10-CM | POA: Diagnosis not present

## 2022-09-24 DIAGNOSIS — K660 Peritoneal adhesions (postprocedural) (postinfection): Secondary | ICD-10-CM

## 2022-09-24 DIAGNOSIS — C800 Disseminated malignant neoplasm, unspecified: Secondary | ICD-10-CM | POA: Diagnosis not present

## 2022-09-24 DIAGNOSIS — C579 Malignant neoplasm of female genital organ, unspecified: Secondary | ICD-10-CM | POA: Diagnosis not present

## 2022-09-24 DIAGNOSIS — K5130 Ulcerative (chronic) rectosigmoiditis without complications: Secondary | ICD-10-CM | POA: Diagnosis not present

## 2022-09-24 DIAGNOSIS — E785 Hyperlipidemia, unspecified: Secondary | ICD-10-CM | POA: Diagnosis not present

## 2022-09-24 DIAGNOSIS — Z7901 Long term (current) use of anticoagulants: Secondary | ICD-10-CM | POA: Diagnosis not present

## 2022-09-24 DIAGNOSIS — Z808 Family history of malignant neoplasm of other organs or systems: Secondary | ICD-10-CM | POA: Diagnosis not present

## 2022-09-24 DIAGNOSIS — K222 Esophageal obstruction: Secondary | ICD-10-CM | POA: Diagnosis not present

## 2022-09-24 DIAGNOSIS — Z800 Family history of malignant neoplasm of digestive organs: Secondary | ICD-10-CM | POA: Diagnosis not present

## 2022-09-24 LAB — COMPREHENSIVE METABOLIC PANEL
ALT: 14 U/L (ref 0–44)
AST: 16 U/L (ref 15–41)
Albumin: 3.2 g/dL — ABNORMAL LOW (ref 3.5–5.0)
Alkaline Phosphatase: 99 U/L (ref 38–126)
Anion gap: 8 (ref 5–15)
BUN: 8 mg/dL (ref 8–23)
CO2: 23 mmol/L (ref 22–32)
Calcium: 8.7 mg/dL — ABNORMAL LOW (ref 8.9–10.3)
Chloride: 105 mmol/L (ref 98–111)
Creatinine, Ser: 0.55 mg/dL (ref 0.44–1.00)
GFR, Estimated: >60 mL/min (ref >60)
Glucose, Bld: 111 mg/dL — ABNORMAL HIGH (ref 70–99)
Potassium: 3.7 mmol/L (ref 3.5–5.1)
Sodium: 136 mmol/L (ref 135–145)
Total Bilirubin: 0.6 mg/dL (ref 0.3–1.2)
Total Protein: 6.2 g/dL — ABNORMAL LOW (ref 6.5–8.1)

## 2022-09-24 LAB — CBC WITH DIFFERENTIAL/PLATELET
Abs Immature Granulocytes: 0.24 10*3/uL — ABNORMAL HIGH (ref 0.00–0.07)
Basophils Absolute: 0.1 10*3/uL (ref 0.0–0.1)
Basophils Relative: 0 %
Eosinophils Absolute: 0 10*3/uL (ref 0.0–0.5)
Eosinophils Relative: 0 %
HCT: 38.6 % (ref 36.0–46.0)
Hemoglobin: 12.3 g/dL (ref 12.0–15.0)
Immature Granulocytes: 2 %
Lymphocytes Relative: 9 %
Lymphs Abs: 1.3 10*3/uL (ref 0.7–4.0)
MCH: 27.3 pg (ref 26.0–34.0)
MCHC: 31.9 g/dL (ref 30.0–36.0)
MCV: 85.8 fL (ref 80.0–100.0)
Monocytes Absolute: 0.8 10*3/uL (ref 0.1–1.0)
Monocytes Relative: 5 %
Neutro Abs: 12.2 10*3/uL — ABNORMAL HIGH (ref 1.7–7.7)
Neutrophils Relative %: 84 %
Platelets: 210 10*3/uL (ref 150–400)
RBC: 4.5 MIL/uL (ref 3.87–5.11)
RDW: 15.9 % — ABNORMAL HIGH (ref 11.5–15.5)
WBC: 14.5 10*3/uL — ABNORMAL HIGH (ref 4.0–10.5)
nRBC: 0 % (ref 0.0–0.2)

## 2022-09-24 LAB — MAGNESIUM: Magnesium: 2 mg/dL (ref 1.7–2.4)

## 2022-09-24 MED ORDER — SODIUM CHLORIDE 0.9 % IV SOLN: Freq: Once | INTRAVENOUS | Status: DC

## 2022-09-24 MED ORDER — PALONOSETRON HCL INJECTION 0.25 MG/5ML: 0.2500 mg | Freq: Once | INTRAVENOUS | Status: DC

## 2022-09-24 MED ORDER — HEPARIN SOD (PORK) LOCK FLUSH 100 UNIT/ML IV SOLN
500.0000 [IU] | Freq: Once | INTRAVENOUS | Status: AC | PRN
  Administered 2022-09-24: 500 [IU]

## 2022-09-24 MED ORDER — SODIUM CHLORIDE 0.9% FLUSH
10.0000 mL | INTRAVENOUS | Status: DC | PRN
  Administered 2022-09-24: 10 mL

## 2022-09-24 MED ORDER — SODIUM CHLORIDE 0.9 % IV SOLN: 545.4000 mg | Freq: Once | INTRAVENOUS | Status: DC

## 2022-09-24 MED ORDER — SODIUM CHLORIDE 0.9 % IV SOLN
10.0000 mg | Freq: Once | INTRAVENOUS | Status: DC
  Filled 2022-09-24: qty 1

## 2022-09-24 MED ORDER — CETIRIZINE HCL 10 MG/ML IV SOLN: 10.0000 mg | Freq: Once | INTRAVENOUS | Status: DC

## 2022-09-24 MED ORDER — FAMOTIDINE IN NACL 20-0.9 MG/50ML-% IV SOLN: 20.0000 mg | Freq: Once | INTRAVENOUS | Status: DC

## 2022-09-24 MED ORDER — SODIUM CHLORIDE 0.9 % IV SOLN: 140.0000 mg/m2 | Freq: Once | INTRAVENOUS | Status: DC

## 2022-09-24 MED ORDER — SODIUM CHLORIDE 0.9 % IV SOLN
150.0000 mg | Freq: Once | INTRAVENOUS | Status: DC
  Filled 2022-09-24: qty 5

## 2022-09-24 NOTE — Progress Notes (Signed)
Nutrition Assessment  Reason for Assessment: Referral (wt loss/poor po)   ASSESSMENT: 77 year old female with newly diagnosed peritoneal carcinomatosis. She is currently receiving neoadjuvant 20% dose-reduced paclitaxel + carboplatin (start 8/12). Pt is under the care of Dr. Ellin Saba.  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 She reports tolerating first chemotherapy well. Pt reports Neulasta injection made her feel exhausted and weak for a few days. She did not benefit from Claritin. 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 Her adult son lives with her. Pt reports getting up each morning (2:30 AM) to pack her son's lunch. Usually she goes back to bed, however unable to sleep recently. Pt reports she is fine eating soup/sandwiches, however husband and son both have their own preferences she caters to. Pt is working to increase intake. Pt states she did not want to eat/drink prior to paracentesis. Reports NPO for recent colonoscopy and then for PAC. Returning to regular eating patterns have been challenging. Pt is currently drinking one Ensure Max (150 kcal, 30 g). She denies nausea, vomiting. She has intermittent diarrhea, constipation. Pt is taking miralax without much relief.    Nutrition Focused Physical Exam: deferred   Medications: eliquis, citracal + D, antivert, zofran, compazine, crestor, B12   Labs: albumin 3.2   Anthropometrics: Wts have decreased ~10% (19 lbs) from 194 lb 12.8 oz on 7/31 s/p paracentesis x 2  Height: 5'1" Weight: 175 lb 3.2 oz  UBW: 195-198 lb (per pt; 198 lb 3.2 oz 05/07/22) BMI: 33.10   NUTRITION DIAGNOSIS: Unintended wt loss related to peritoneal carcinomatosis as evidenced by abdominal ascites s/p paracentesis, 12% decrease from  usual wt - severe for time frame    INTERVENTION:  Educated on strategies early satiety, encouraged small frequent meals/snacks  Educated on importance of protein, discussed including protein source at every meal - handout with ideas provided Continue drinking Ensure, suggested switching to Ensure Complete/equivalent for added calories - recommend increasing 2-3/day Samples of Ensure Complete, CIB powder provided for pt to try Encouraged pt to consider having son pack his lunch and decreasing meal options offered to family to lessen pt work load at home Consider monitoring serum TG given chylous fluid noted on recent paracentesis   MONITORING, EVALUATION, GOAL: Pt will tolerate adequate calories and protein to minimize loss of lean body mass during treatment    Next Visit: Monday September 16 via telephone

## 2022-09-24 NOTE — Progress Notes (Signed)
Patient has been examined by Dr. Ellin Saba. Vital signs and labs have been reviewed by MD - ANC, Creatinine, LFTs, hemoglobin, and platelets are within treatment parameters per M.D. - pt may proceed with treatment. Taxol dose-reduced by 20% per MD. Primary RN and pharmacy notified.

## 2022-09-24 NOTE — Progress Notes (Addendum)
Patient presents today for possible fluids per provider's order. Patient is in satisfactory condition with no new complaints voiced.  Vital signs are stable.  Labs reviewed by Dr. Ellin Saba during the office visit and all labs are within treatment parameters. No fluids are needed today per Dr.K. We are discontinuing pt's D3 injection from all future cycles due to terrible side effects from it. 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 per Dr.K. We will proceed with treatment per MD orders.

## 2022-09-24 NOTE — Patient Instructions (Signed)

## 2022-09-26 NOTE — Progress Notes (Deleted)
Rapid Infusion Rituximab Pharmacist Evaluation  Rachel Carlson is a 77 y.o. female being treated with rituximab for ***. This patient {Actions; may/not:14603} be considered for RIR.   A pharmacist has verified the patient tolerated rituximab infusions per the [facility] standard infusion protocol without grade 3-4 infusion reactions. The treatment plan will be updated to reflect RIR if the patient qualifies per the checklist below:   Age > 23 years old {YES/NO:21197}  Clinically significant cardiovascular disease {YES/NO:21197}  Circulating lymphocyte count < 5000/uL prior to cycle two {YES/NO:21197} Lab Results  Component Value Date   LYMPHSABS 1.3 09/24/2022    Prior documented grade 3-4 infusion reaction to rituximab {YES/NO:21197}  Prior documented grade 1-2 infusion reaction to rituximab (If YES, Pharmacist will confirm with Physician if patient is still a candidate for RIR) {YES/NO:21197}  Previous rituximab infusion within the past 6 months {YES/NO:21197}  Treatment Plan updated orders to reflect RIR {YES/NO:21197}   [name] {DOES/DOES NOT:200015} meet the criteria for Rapid Infusion Rituximab. This patient {ACTION; IS/IS [id number]} going to be switched to rapid infusion rituximab.   Stephens Shire 09/26/22 1:00 PM

## 2022-10-03 ENCOUNTER — Inpatient Hospital Stay: Payer: Medicare Other | Admitting: Hematology

## 2022-10-03 ENCOUNTER — Inpatient Hospital Stay: Payer: Medicare Other

## 2022-10-03 ENCOUNTER — Inpatient Hospital Stay: Payer: Medicare Other | Attending: Hematology

## 2022-10-03 VITALS — BP 123/61 | HR 92 | Temp 98.5°F | Resp 18

## 2022-10-03 DIAGNOSIS — G620 Drug-induced polyneuropathy: Secondary | ICD-10-CM | POA: Diagnosis not present

## 2022-10-03 DIAGNOSIS — K219 Gastro-esophageal reflux disease without esophagitis: Secondary | ICD-10-CM | POA: Insufficient documentation

## 2022-10-03 DIAGNOSIS — R188 Other ascites: Secondary | ICD-10-CM | POA: Insufficient documentation

## 2022-10-03 DIAGNOSIS — I82409 Acute embolism and thrombosis of unspecified deep veins of unspecified lower extremity: Secondary | ICD-10-CM | POA: Insufficient documentation

## 2022-10-03 DIAGNOSIS — R110 Nausea: Secondary | ICD-10-CM | POA: Insufficient documentation

## 2022-10-03 DIAGNOSIS — Z5111 Encounter for antineoplastic chemotherapy: Secondary | ICD-10-CM | POA: Diagnosis not present

## 2022-10-03 DIAGNOSIS — Z808 Family history of malignant neoplasm of other organs or systems: Secondary | ICD-10-CM | POA: Diagnosis not present

## 2022-10-03 DIAGNOSIS — C579 Malignant neoplasm of female genital organ, unspecified: Secondary | ICD-10-CM | POA: Diagnosis present

## 2022-10-03 DIAGNOSIS — E669 Obesity, unspecified: Secondary | ICD-10-CM | POA: Insufficient documentation

## 2022-10-03 DIAGNOSIS — C800 Disseminated malignant neoplasm, unspecified: Secondary | ICD-10-CM

## 2022-10-03 DIAGNOSIS — Z800 Family history of malignant neoplasm of digestive organs: Secondary | ICD-10-CM | POA: Diagnosis not present

## 2022-10-03 DIAGNOSIS — K660 Peritoneal adhesions (postprocedural) (postinfection): Secondary | ICD-10-CM

## 2022-10-03 DIAGNOSIS — R971 Elevated cancer antigen 125 [CA 125]: Secondary | ICD-10-CM | POA: Diagnosis not present

## 2022-10-03 DIAGNOSIS — E785 Hyperlipidemia, unspecified: Secondary | ICD-10-CM | POA: Insufficient documentation

## 2022-10-03 DIAGNOSIS — D509 Iron deficiency anemia, unspecified: Secondary | ICD-10-CM | POA: Diagnosis not present

## 2022-10-03 DIAGNOSIS — Z8042 Family history of malignant neoplasm of prostate: Secondary | ICD-10-CM | POA: Diagnosis not present

## 2022-10-03 DIAGNOSIS — Z7901 Long term (current) use of anticoagulants: Secondary | ICD-10-CM | POA: Insufficient documentation

## 2022-10-03 DIAGNOSIS — K5130 Ulcerative (chronic) rectosigmoiditis without complications: Secondary | ICD-10-CM | POA: Insufficient documentation

## 2022-10-03 DIAGNOSIS — K222 Esophageal obstruction: Secondary | ICD-10-CM | POA: Insufficient documentation

## 2022-10-03 DIAGNOSIS — Z79899 Other long term (current) drug therapy: Secondary | ICD-10-CM | POA: Diagnosis not present

## 2022-10-03 LAB — COMPREHENSIVE METABOLIC PANEL
ALT: 14 U/L (ref 0–44)
AST: 20 U/L (ref 15–41)
Albumin: 3.3 g/dL — ABNORMAL LOW (ref 3.5–5.0)
Alkaline Phosphatase: 55 U/L (ref 38–126)
Anion gap: 13 (ref 5–15)
BUN: 17 mg/dL (ref 8–23)
CO2: 21 mmol/L — ABNORMAL LOW (ref 22–32)
Calcium: 9.2 mg/dL (ref 8.9–10.3)
Chloride: 102 mmol/L (ref 98–111)
Creatinine, Ser: 0.55 mg/dL (ref 0.44–1.00)
GFR, Estimated: >60 mL/min (ref >60)
Glucose, Bld: 107 mg/dL — ABNORMAL HIGH (ref 70–99)
Potassium: 3.9 mmol/L (ref 3.5–5.1)
Sodium: 136 mmol/L (ref 135–145)
Total Bilirubin: 0.6 mg/dL (ref 0.3–1.2)
Total Protein: 6.3 g/dL — ABNORMAL LOW (ref 6.5–8.1)

## 2022-10-03 LAB — CBC WITH DIFFERENTIAL/PLATELET
Abs Immature Granulocytes: 0.05 10*3/uL (ref 0.00–0.07)
Basophils Absolute: 0.1 10*3/uL (ref 0.0–0.1)
Basophils Relative: 1 %
Eosinophils Absolute: 0 10*3/uL (ref 0.0–0.5)
Eosinophils Relative: 0 %
HCT: 34.4 % — ABNORMAL LOW (ref 36.0–46.0)
Hemoglobin: 11.1 g/dL — ABNORMAL LOW (ref 12.0–15.0)
Immature Granulocytes: 1 %
Lymphocytes Relative: 10 %
Lymphs Abs: 1 10*3/uL (ref 0.7–4.0)
MCH: 27.7 pg (ref 26.0–34.0)
MCHC: 32.3 g/dL (ref 30.0–36.0)
MCV: 85.8 fL (ref 80.0–100.0)
Monocytes Absolute: 0.8 10*3/uL (ref 0.1–1.0)
Monocytes Relative: 9 %
Neutro Abs: 7.5 10*3/uL (ref 1.7–7.7)
Neutrophils Relative %: 79 %
Platelets: 348 10*3/uL (ref 150–400)
RBC: 4.01 MIL/uL (ref 3.87–5.11)
RDW: 17.5 % — ABNORMAL HIGH (ref 11.5–15.5)
WBC: 9.4 10*3/uL (ref 4.0–10.5)
nRBC: 0 % (ref 0.0–0.2)

## 2022-10-03 LAB — MAGNESIUM: Magnesium: 2.1 mg/dL (ref 1.7–2.4)

## 2022-10-03 MED ORDER — SODIUM CHLORIDE 0.9 % IV SOLN
10.0000 mg | Freq: Once | INTRAVENOUS | Status: AC
  Administered 2022-10-03: 10 mg via INTRAVENOUS
  Filled 2022-10-03: qty 10

## 2022-10-03 MED ORDER — SODIUM CHLORIDE 0.9% FLUSH
10.0000 mL | INTRAVENOUS | Status: DC | PRN
  Administered 2022-10-03: 10 mL

## 2022-10-03 MED ORDER — SODIUM CHLORIDE 0.9% FLUSH
10.0000 mL | Freq: Once | INTRAVENOUS | Status: AC
  Administered 2022-10-03: 10 mL via INTRAVENOUS

## 2022-10-03 MED ORDER — PALONOSETRON HCL INJECTION 0.25 MG/5ML
0.2500 mg | Freq: Once | INTRAVENOUS | Status: AC
  Administered 2022-10-03: 0.25 mg via INTRAVENOUS
  Filled 2022-10-03: qty 5

## 2022-10-03 MED ORDER — FAMOTIDINE IN NACL 20-0.9 MG/50ML-% IV SOLN
20.0000 mg | Freq: Once | INTRAVENOUS | Status: AC
  Administered 2022-10-03: 20 mg via INTRAVENOUS
  Filled 2022-10-03: qty 50

## 2022-10-03 MED ORDER — SODIUM CHLORIDE 0.9 % IV SOLN
150.0000 mg | Freq: Once | INTRAVENOUS | Status: AC
  Administered 2022-10-03: 150 mg via INTRAVENOUS
  Filled 2022-10-03: qty 150

## 2022-10-03 MED ORDER — HEPARIN SOD (PORK) LOCK FLUSH 100 UNIT/ML IV SOLN
500.0000 [IU] | Freq: Once | INTRAVENOUS | Status: AC | PRN
  Administered 2022-10-03: 500 [IU]

## 2022-10-03 MED ORDER — CETIRIZINE HCL 10 MG/ML IV SOLN
10.0000 mg | Freq: Once | INTRAVENOUS | Status: AC
  Administered 2022-10-03: 10 mg via INTRAVENOUS
  Filled 2022-10-03: qty 1

## 2022-10-03 MED ORDER — SODIUM CHLORIDE 0.9 % IV SOLN
140.0000 mg/m2 | Freq: Once | INTRAVENOUS | Status: AC
  Administered 2022-10-03: 270 mg via INTRAVENOUS
  Filled 2022-10-03: qty 45

## 2022-10-03 MED ORDER — SODIUM CHLORIDE 0.9 % IV SOLN
550.0000 mg | Freq: Once | INTRAVENOUS | Status: AC
  Administered 2022-10-03: 550 mg via INTRAVENOUS
  Filled 2022-10-03: qty 55

## 2022-10-03 MED ORDER — SODIUM CHLORIDE 0.9 % IV SOLN: Freq: Once | INTRAVENOUS | Status: AC

## 2022-10-03 NOTE — Patient Instructions (Signed)
[facility]  Discharge Instructions: Thank you for choosing [facility] to provide your oncology and hematology care.  If you have a lab appointment with the Cancer Center - please note that after April 8th, 2024, all labs will be drawn in the cancer center.  You do not have to check in or register with the main entrance as you have in the past but will complete your check-in in the cancer center.  Wear comfortable clothing and clothing appropriate for easy access to any Portacath or PICC line.   We strive to give you quality time with your provider. You may need to reschedule your appointment if you arrive late (15 or more minutes).  Arriving late affects you and other patients whose appointments are after yours.  Also, if you miss three or more appointments without notifying the office, you may be dismissed from the clinic at the provider's discretion.      For prescription refill requests, have your pharmacy contact our office and allow 72 hours for refills to be completed.    Today you received the following chemotherapy and/or immunotherapy agents Paclitaxel and Carboplatin   To help prevent nausea and vomiting after your treatment, we encourage you to take your nausea medication as directed.  Paclitaxel Injection What is this medication? PACLITAXEL (PAK li TAX el) treats some types of cancer. It works by slowing down the growth of cancer cells. This medicine may be used for other purposes; ask your health care provider or pharmacist if you have questions. COMMON BRAND NAME(S): Onxol, Taxol What should I tell my care team before I take this medication? They need to know if you have any of these conditions: Heart disease Liver disease Low white blood cell levels An unusual or allergic reaction to paclitaxel, other medications, foods, dyes, or preservatives If you or your partner are pregnant or trying to get pregnant Breast-feeding How should I use  this medication? This medication is injected into a vein. It is given by your care team in a hospital or clinic setting. Talk to your care team about the use of this medication in children. While it may be given to children for selected conditions, precautions do apply. Overdosage: If you think you have taken too much of this medicine contact a poison control center or emergency room at once. NOTE: This medicine is only for you. Do not share this medicine with others. What if I miss a dose? Keep appointments for follow-up doses. It is important not to miss your dose. Call your care team if you are unable to keep an appointment. What may interact with this medication? Do not take this medication with any of the following: Live virus vaccines Other medications may affect the way this medication works. Talk with your care team about all of the medications you take. They may suggest changes to your treatment plan to lower the risk of side effects and to make sure your medications work as intended. This list may not describe all possible interactions. Give your health care provider a list of all the medicines, herbs, non-prescription drugs, or dietary supplements you use. Also tell them if you smoke, drink alcohol, or use illegal drugs. Some items may interact with your medicine. What should I watch for while using this medication? Your condition will be monitored carefully while you are receiving this medication. You may need blood work while taking this medication. This medication may make you feel generally unwell. This is not   uncommon as chemotherapy can affect healthy cells as well as cancer cells. Report any side effects. Continue your course of treatment even though you feel ill unless your care team tells you to stop. This medication can cause serious allergic reactions. To reduce the risk, your care team may give you other medications to take before receiving this one. Be sure to follow the  directions from your care team. This medication may increase your risk of getting an infection. Call your care team for advice if you get a fever, chills, sore throat, or other symptoms of a cold or flu. Do not treat yourself. Try to avoid being around people who are sick. This medication may increase your risk to bruise or bleed. Call your care team if you notice any unusual bleeding. Be careful brushing or flossing your teeth or using a toothpick because you may get an infection or bleed more easily. If you have any dental work done, tell your dentist you are receiving this medication. Talk to your care team if you may be pregnant. Serious birth defects can occur if you take this medication during pregnancy. Talk to your care team before breastfeeding. Changes to your treatment plan may be needed. What side effects may I notice from receiving this medication? Side effects that you should report to your care team as soon as possible: Allergic reactions--skin rash, itching, hives, swelling of the face, lips, tongue, or throat Heart rhythm changes--fast or irregular heartbeat, dizziness, feeling faint or lightheaded, chest pain, trouble breathing Increase in blood pressure Infection--fever, chills, cough, sore throat, wounds that don't heal, pain or trouble when passing urine, general feeling of discomfort or being unwell Low blood pressure--dizziness, feeling faint or lightheaded, blurry vision Low red blood cell level--unusual weakness or fatigue, dizziness, headache, trouble breathing Painful swelling, warmth, or redness of the skin, blisters or sores at the infusion site Pain, tingling, or numbness in the hands or feet Slow heartbeat--dizziness, feeling faint or lightheaded, confusion, trouble breathing, unusual weakness or fatigue Unusual bruising or bleeding Side effects that usually do not require medical attention (report to your care team if they continue or are bothersome): Diarrhea Hair  loss Joint pain Loss of appetite Muscle pain Nausea Vomiting This list may not describe all possible side effects. Call your doctor for medical advice about side effects. You may report side effects to FDA at 1-800-FDA-1088. Where should I keep my medication? This medication is given in a hospital or clinic. It will not be stored at home. NOTE: This sheet is a summary. It may not cover all possible information. If you have questions about this medicine, talk to your doctor, pharmacist, or health care provider.  2024 Elsevier/Gold Standard (2021-06-06 00:00:00)  Carboplatin Injection What is this medication? CARBOPLATIN (KAR boe pla tin) treats some types of cancer. It works by slowing down the growth of cancer cells. This medicine may be used for other purposes; ask your health care provider or pharmacist if you have questions. COMMON BRAND NAME(S): Paraplatin What should I tell my care team before I take this medication? They need to know if you have any of these conditions: Blood disorders Hearing problems Kidney disease Recent or ongoing radiation therapy An unusual or allergic reaction to carboplatin, cisplatin, other medications, foods, dyes, or preservatives Pregnant or trying to get pregnant Breast-feeding How should I use this medication? This medication is injected into a vein. It is given by your care team in a hospital or clinic setting. Talk to your care   team about the use of this medication in children. Special care may be needed. Overdosage: If you think you have taken too much of this medicine contact a poison control center or emergency room at once. NOTE: This medicine is only for you. Do not share this medicine with others. What if I miss a dose? Keep appointments for follow-up doses. It is important not to miss your dose. Call your care team if you are unable to keep an appointment. What may interact with this medication? Medications for seizures Some  antibiotics, such as amikacin, gentamicin, neomycin, streptomycin, tobramycin Vaccines This list may not describe all possible interactions. Give your health care provider a list of all the medicines, herbs, non-prescription drugs, or dietary supplements you use. Also tell them if you smoke, drink alcohol, or use illegal drugs. Some items may interact with your medicine. What should I watch for while using this medication? Your condition will be monitored carefully while you are receiving this medication. You may need blood work while taking this medication. This medication may make you feel generally unwell. This is not uncommon, as chemotherapy can affect healthy cells as well as cancer cells. Report any side effects. Continue your course of treatment even though you feel ill unless your care team tells you to stop. In some cases, you may be given additional medications to help with side effects. Follow all directions for their use. This medication may increase your risk of getting an infection. Call your care team for advice if you get a fever, chills, sore throat, or other symptoms of a cold or flu. Do not treat yourself. Try to avoid being around people who are sick. Avoid taking medications that contain aspirin, acetaminophen, ibuprofen, naproxen, or ketoprofen unless instructed by your care team. These medications may hide a fever. Be careful brushing or flossing your teeth or using a toothpick because you may get an infection or bleed more easily. If you have any dental work done, tell your dentist you are receiving this medication. Talk to your care team if you wish to become pregnant or think you might be pregnant. This medication can cause serious birth defects. Talk to your care team about effective forms of contraception. Do not breast-feed while taking this medication. What side effects may I notice from receiving this medication? Side effects that you should report to your care team as  soon as possible: Allergic reactions--skin rash, itching, hives, swelling of the face, lips, tongue, or throat Infection--fever, chills, cough, sore throat, wounds that don't heal, pain or trouble when passing urine, general feeling of discomfort or being unwell Low red blood cell level--unusual weakness or fatigue, dizziness, headache, trouble breathing Pain, tingling, or numbness in the hands or feet, muscle weakness, change in vision, confusion or trouble speaking, loss of balance or coordination, trouble walking, seizures Unusual bruising or bleeding Side effects that usually do not require medical attention (report to your care team if they continue or are bothersome): Hair loss Nausea Unusual weakness or fatigue Vomiting This list may not describe all possible side effects. Call your doctor for medical advice about side effects. You may report side effects to FDA at 1-800-FDA-1088. Where should I keep my medication? This medication is given in a hospital or clinic. It will not be stored at home. NOTE: This sheet is a summary. It may not cover all possible information. If you have questions about this medicine, talk to your doctor, pharmacist, or health care provider.  2024 Elsevier/Gold Standard (2021-05-09   00:00:00)    BELOW ARE SYMPTOMS THAT SHOULD BE REPORTED IMMEDIATELY: *FEVER GREATER THAN 100.4 F (38 C) OR HIGHER *CHILLS OR SWEATING *NAUSEA AND VOMITING THAT IS NOT CONTROLLED WITH YOUR NAUSEA MEDICATION *UNUSUAL SHORTNESS OF BREATH *UNUSUAL BRUISING OR BLEEDING *URINARY PROBLEMS (pain or burning when urinating, or frequent urination) *BOWEL PROBLEMS (unusual diarrhea, constipation, pain near the anus) TENDERNESS IN MOUTH AND THROAT WITH OR WITHOUT PRESENCE OF ULCERS (sore throat, sores in mouth, or a toothache) UNUSUAL RASH, SWELLING OR PAIN  UNUSUAL VAGINAL DISCHARGE OR ITCHING   Items with * indicate a potential emergency and should be followed up as soon as possible or  go to the Emergency Department if any problems should occur.  Please show the CHEMOTHERAPY ALERT CARD or IMMUNOTHERAPY ALERT CARD at check-in to the Emergency Department and triage nurse.  Should you have questions after your visit or need to cancel or reschedule your appointment, please contact [facility] [phone]  and follow the prompts.  Office hours are 8:00 a.m. to 4:30 p.m. Monday - Friday. Please note that voicemails left after 4:00 p.m. may not be returned until the following business day.  We are closed weekends and major holidays. You have access to a nurse at all times for urgent questions. Please call the main number to the clinic [phone] and follow the prompts.  For any non-urgent questions, you may also contact your provider using MyChart. We now offer e-Visits for anyone 18 and older to request care online for non-urgent symptoms. For details visit [email or web address].   Also download the MyChart app! Go to the app store, search "MyChart", open the app, select [place], and log in with your MyChart username and password.   

## 2022-10-03 NOTE — Progress Notes (Signed)
Patient presents today for Paclitaxel and Carboplatin infusion.  Patient is in satisfactory condition with no new complaints voiced.  Vital signs are stable.  Labs reviewed and all labs are within treatment parameters.  We will proceed with treatment per MD orders.    Treatment given today per MD orders. Tolerated infusion without adverse affects. Vital signs stable. No complaints at this time. Discharged from clinic ambulatory in stable condition. Alert and oriented x 3. F/U with [facility] as scheduled.

## 2022-10-03 NOTE — Progress Notes (Unsigned)
Patients port flushed without difficulty.  Good blood return noted with no bruising or swelling noted at site.  Stable during access and blood draw.  Patient to remain accessed for treatment. 

## 2022-10-04 ENCOUNTER — Encounter: Payer: Self-pay | Admitting: Hematology

## 2022-10-05 ENCOUNTER — Inpatient Hospital Stay: Payer: Medicare Other

## 2022-10-15 ENCOUNTER — Telehealth: Payer: Self-pay | Admitting: Dietician

## 2022-10-15 ENCOUNTER — Inpatient Hospital Stay: Payer: Medicare Other | Admitting: Dietician

## 2022-10-15 NOTE — Telephone Encounter (Signed)
Nutrition Follow-up:  Pt with peritoneal carcinomatosis. She is currently receiving neoadjuvant 20% dose-reduced paclitaxel + carboplatin (start 8/12).   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 Pt reports feeling exhausted. Unable to shake fatigue s/p cycle 2. Pt reports  soreness to entire body starting the third day after chemo. This lasted ~3-4 days. Pt reports daily nausea. She only takes antiemetics if it "gets bad." Appetite comes and goes. Tries to eat at least a tablespoon of everything on the plate. Recalls ham biscuit for breakfast, couple bites of steak, baked beans, potato salad for dinner. Pt is drinking 1-2 Ensure Max. Her son has not been able to find the complete in stores. She is taking miralax for constipation. Had a BM this morning. She is drinking ~40 ounces of water. Pt is not resting well. Says she is unable to get comfortable, toss/turns all night. Pt continues to get out of bed and pack her son's lunch at 2AM. She is interested in a sleep aid. Pt reports losing her hair. She is asking where to purchase a wig.    Medications: reviewed   Labs: 9/4 - reviewed  Anthropometrics: Last wt 176 lb 8 oz on 9/4  8/26 - 176 lb 8 oz  NUTRITION DIAGNOSIS: Unintended wt loss - likely ongoing    INTERVENTION:  Continued encouragement for small bites/sips q2h Continue drinking Ensure 2/day. Educated Equate Plus as Ensure Plus equivalent - encouraged switching for increased calories Continue daily bowel regimen Will message MD regarding sleep aid + LCSW regarding assistance with wig    MONITORING, EVALUATION, GOAL: wt trends, intake   NEXT VISIT: Thursday October 17 via telephone

## 2022-10-18 ENCOUNTER — Other Ambulatory Visit: Payer: Self-pay | Admitting: *Deleted

## 2022-10-18 ENCOUNTER — Inpatient Hospital Stay: Payer: Medicare Other | Admitting: Licensed Clinical Social Worker

## 2022-10-18 DIAGNOSIS — C800 Disseminated malignant neoplasm, unspecified: Secondary | ICD-10-CM

## 2022-10-18 NOTE — Progress Notes (Signed)
[facility] CSW Progress Note  Clinical Child psychotherapist contacted patient by phone to discuss resources for obtaining a wig.  Pt provided with contact details for the [employer] and encouraged to reach out to Marijean Niemann for additional resources.  CSW to remain available to provide support throughout duration of treatment as appropriate.      Rachel Moulds, LCSW Clinical Social Worker [facility]

## 2022-10-18 NOTE — Progress Notes (Signed)
Iron, Ferritin, TIBC added to labs per Dr. Anders Simmonds for severe fatigue.

## 2022-10-24 ENCOUNTER — Inpatient Hospital Stay: Payer: Medicare Other

## 2022-10-24 ENCOUNTER — Other Ambulatory Visit: Payer: Self-pay

## 2022-10-24 ENCOUNTER — Inpatient Hospital Stay: Payer: Medicare Other | Admitting: Oncology

## 2022-10-24 ENCOUNTER — Encounter: Payer: Self-pay | Admitting: Oncology

## 2022-10-24 VITALS — BP 144/86 | HR 98 | Temp 98.4°F | Resp 18

## 2022-10-24 VITALS — Wt 174.0 lb

## 2022-10-24 DIAGNOSIS — Z800 Family history of malignant neoplasm of digestive organs: Secondary | ICD-10-CM | POA: Diagnosis not present

## 2022-10-24 DIAGNOSIS — C786 Secondary malignant neoplasm of retroperitoneum and peritoneum: Secondary | ICD-10-CM | POA: Diagnosis not present

## 2022-10-24 DIAGNOSIS — D509 Iron deficiency anemia, unspecified: Secondary | ICD-10-CM | POA: Diagnosis not present

## 2022-10-24 DIAGNOSIS — K219 Gastro-esophageal reflux disease without esophagitis: Secondary | ICD-10-CM | POA: Diagnosis not present

## 2022-10-24 DIAGNOSIS — E785 Hyperlipidemia, unspecified: Secondary | ICD-10-CM | POA: Diagnosis not present

## 2022-10-24 DIAGNOSIS — Z808 Family history of malignant neoplasm of other organs or systems: Secondary | ICD-10-CM | POA: Diagnosis not present

## 2022-10-24 DIAGNOSIS — C800 Disseminated malignant neoplasm, unspecified: Secondary | ICD-10-CM

## 2022-10-24 DIAGNOSIS — K5130 Ulcerative (chronic) rectosigmoiditis without complications: Secondary | ICD-10-CM | POA: Diagnosis not present

## 2022-10-24 DIAGNOSIS — R110 Nausea: Secondary | ICD-10-CM | POA: Diagnosis not present

## 2022-10-24 DIAGNOSIS — R188 Other ascites: Secondary | ICD-10-CM | POA: Diagnosis not present

## 2022-10-24 DIAGNOSIS — I82409 Acute embolism and thrombosis of unspecified deep veins of unspecified lower extremity: Secondary | ICD-10-CM | POA: Diagnosis not present

## 2022-10-24 DIAGNOSIS — Z5111 Encounter for antineoplastic chemotherapy: Secondary | ICD-10-CM | POA: Diagnosis not present

## 2022-10-24 DIAGNOSIS — Z79899 Other long term (current) drug therapy: Secondary | ICD-10-CM | POA: Diagnosis not present

## 2022-10-24 DIAGNOSIS — Z7901 Long term (current) use of anticoagulants: Secondary | ICD-10-CM | POA: Diagnosis not present

## 2022-10-24 DIAGNOSIS — K660 Peritoneal adhesions (postprocedural) (postinfection): Secondary | ICD-10-CM

## 2022-10-24 DIAGNOSIS — G620 Drug-induced polyneuropathy: Secondary | ICD-10-CM | POA: Diagnosis not present

## 2022-10-24 DIAGNOSIS — K222 Esophageal obstruction: Secondary | ICD-10-CM | POA: Diagnosis not present

## 2022-10-24 DIAGNOSIS — R971 Elevated cancer antigen 125 [CA 125]: Secondary | ICD-10-CM | POA: Diagnosis not present

## 2022-10-24 LAB — COMPREHENSIVE METABOLIC PANEL
ALT: 13 U/L (ref 0–44)
AST: 15 U/L (ref 15–41)
Albumin: 3.4 g/dL — ABNORMAL LOW (ref 3.5–5.0)
Alkaline Phosphatase: 56 U/L (ref 38–126)
Anion gap: 11 (ref 5–15)
BUN: 13 mg/dL (ref 8–23)
CO2: 21 mmol/L — ABNORMAL LOW (ref 22–32)
Calcium: 8.9 mg/dL (ref 8.9–10.3)
Chloride: 105 mmol/L (ref 98–111)
Creatinine, Ser: 0.52 mg/dL (ref 0.44–1.00)
GFR, Estimated: >60 mL/min (ref >60)
Glucose, Bld: 98 mg/dL (ref 70–99)
Potassium: 3.8 mmol/L (ref 3.5–5.1)
Sodium: 137 mmol/L (ref 135–145)
Total Bilirubin: 0.5 mg/dL (ref 0.3–1.2)
Total Protein: 6.3 g/dL — ABNORMAL LOW (ref 6.5–8.1)

## 2022-10-24 LAB — CBC WITH DIFFERENTIAL/PLATELET
Abs Immature Granulocytes: 0.01 10*3/uL (ref 0.00–0.07)
Basophils Absolute: 0.1 10*3/uL (ref 0.0–0.1)
Basophils Relative: 1 %
Eosinophils Absolute: 0 10*3/uL (ref 0.0–0.5)
Eosinophils Relative: 0 %
HCT: 33.6 % — ABNORMAL LOW (ref 36.0–46.0)
Hemoglobin: 10.7 g/dL — ABNORMAL LOW (ref 12.0–15.0)
Immature Granulocytes: 0 %
Lymphocytes Relative: 19 %
Lymphs Abs: 1 10*3/uL (ref 0.7–4.0)
MCH: 27.6 pg (ref 26.0–34.0)
MCHC: 31.8 g/dL (ref 30.0–36.0)
MCV: 86.6 fL (ref 80.0–100.0)
Monocytes Absolute: 0.5 10*3/uL (ref 0.1–1.0)
Monocytes Relative: 9 %
Neutro Abs: 3.7 10*3/uL (ref 1.7–7.7)
Neutrophils Relative %: 71 %
Platelets: 237 10*3/uL (ref 150–400)
RBC: 3.88 MIL/uL (ref 3.87–5.11)
RDW: 17.4 % — ABNORMAL HIGH (ref 11.5–15.5)
WBC: 5.2 10*3/uL (ref 4.0–10.5)
nRBC: 0 % (ref 0.0–0.2)

## 2022-10-24 LAB — IRON AND TIBC
Iron: 38 ug/dL (ref 28–170)
Saturation Ratios: 10 % — ABNORMAL LOW (ref 10.4–31.8)
TIBC: 369 ug/dL (ref 250–450)
UIBC: 331 ug/dL

## 2022-10-24 LAB — MAGNESIUM: Magnesium: 2.2 mg/dL (ref 1.7–2.4)

## 2022-10-24 LAB — FERRITIN: Ferritin: 22 ng/mL (ref 11–307)

## 2022-10-24 MED ORDER — PALONOSETRON HCL INJECTION 0.25 MG/5ML
0.2500 mg | Freq: Once | INTRAVENOUS | Status: AC
  Administered 2022-10-24: 0.25 mg via INTRAVENOUS
  Filled 2022-10-24: qty 5

## 2022-10-24 MED ORDER — SODIUM CHLORIDE 0.9 % IV SOLN
140.0000 mg/m2 | Freq: Once | INTRAVENOUS | Status: AC
  Administered 2022-10-24: 270 mg via INTRAVENOUS
  Filled 2022-10-24: qty 45

## 2022-10-24 MED ORDER — SODIUM CHLORIDE 0.9% FLUSH
10.0000 mL | Freq: Once | INTRAVENOUS | Status: AC
  Administered 2022-10-24: 10 mL

## 2022-10-24 MED ORDER — CETIRIZINE HCL 10 MG/ML IV SOLN
10.0000 mg | Freq: Once | INTRAVENOUS | Status: AC
  Administered 2022-10-24: 10 mg via INTRAVENOUS
  Filled 2022-10-24: qty 1

## 2022-10-24 MED ORDER — SODIUM CHLORIDE 0.9 % IV SOLN
10.0000 mg | Freq: Once | INTRAVENOUS | Status: AC
  Administered 2022-10-24: 10 mg via INTRAVENOUS
  Filled 2022-10-24: qty 10

## 2022-10-24 MED ORDER — SODIUM CHLORIDE 0.9 % IV SOLN
150.0000 mg | Freq: Once | INTRAVENOUS | Status: AC
  Administered 2022-10-24: 150 mg via INTRAVENOUS
  Filled 2022-10-24: qty 5

## 2022-10-24 MED ORDER — SODIUM CHLORIDE 0.9 % IV SOLN
545.4000 mg | Freq: Once | INTRAVENOUS | Status: AC
  Administered 2022-10-24: 550 mg via INTRAVENOUS
  Filled 2022-10-24: qty 55

## 2022-10-24 MED ORDER — SODIUM CHLORIDE 0.9% FLUSH
10.0000 mL | INTRAVENOUS | Status: DC | PRN
  Administered 2022-10-24: 10 mL

## 2022-10-24 MED ORDER — HEPARIN SOD (PORK) LOCK FLUSH 100 UNIT/ML IV SOLN
500.0000 [IU] | Freq: Once | INTRAVENOUS | Status: AC | PRN
  Administered 2022-10-24: 500 [IU]

## 2022-10-24 MED ORDER — FAMOTIDINE IN NACL 20-0.9 MG/50ML-% IV SOLN
20.0000 mg | Freq: Once | INTRAVENOUS | Status: AC
  Administered 2022-10-24: 20 mg via INTRAVENOUS
  Filled 2022-10-24: qty 50

## 2022-10-24 MED ORDER — SODIUM CHLORIDE 0.9 % IV SOLN: Freq: Once | INTRAVENOUS | Status: AC

## 2022-10-24 NOTE — Progress Notes (Signed)
[facility] [address] [place] [address]    Clinic Day:  10/24/2022  Referring physician: Tower, Audrie Gallus, MD  Patient Care Team: Tower, Audrie Gallus, MD as PCP - General Doreatha Massed, MD as Medical Oncologist (Medical Oncology) Therese Sarah, RN as Oncology Nurse Navigator (Medical Oncology)   ASSESSMENT & PLAN:   Assessment: 1.  Peritoneal carcinomatosis/ovarian cancer: - Presentation: 7-month history of abdominal distention and bloating. - Last colonoscopy (01/01/2020): No evidence of malignancy. - CTAP (08/23/2022): Moderate ascites, probable omental caking in the left lower quadrant, possible small peritoneal implants seen in the pelvis.  Diverticulosis of descending and sigmoid colon with moderate wall thickening of the distal sigmoid colon.  Small bilateral pleural effusions. - 08/26/2022: CA125: 7356, CEA: 1 - Germline mutation testing (09/16/2017): Negative. 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 - Cycle 1 of carboplatin and paclitaxel on 09/12/2019   2. Social/Family History: -She lives at home with husband and is his primary caregiver. She is independent ADLS and iADLS. Retired International aid/development worker. No tobacco use.  -She had skin squamous cell carcinoma. Her brother had colon cancer at 67 and follicular non-Hodgkin's lymphoma and DLBCL, her maternal grandfather died of prostate cancer, maternal first cousin had colon cancer, and maternal aunt died of lung cancer (smoker).     Plan: 1.  Peritoneal carcinomatosis/ovarian cancer: - She had cycle 1 of chemotherapy on 09/12/2019. - She had severe body pains after G-CSF injection lasting about 4 days.  Tylenol did not help.  She took Claritin but still had pains.  Pains were predominantly in the legs and pelvic girdle.  Denied any  nausea or vomiting. - Last paracentesis on 09/18/2022 with 3.4 L removed. - Reviewed labs today: Normal LFTs.  Albumin improved to 3.2.  CBC consistent with anemia- iron deficiency with a component of chronic disease. - Proceed with Cycle 3 today -- Obtain CT CAP prior to next visit to assess for response. RTC in 3 weeks   2.  Intermittent nausea: - Continue Compazine 5 mg every 6 hours as needed.   3.  Left gastrocnemius/calf vein distal DVT (Dx 07/24/2022): - Continue Eliquis twice daily.  No bleeding issues.  4.  Peripheral neuropathy: - She reported new onset constant tingling/numbness in the fingertips of first 4 fingers of both hands, started 1 to 2 days after cycle 1 of chemotherapy.  She also reported funny feeling in her feet.  No neuropathic pains. - Will decrease paclitaxel dose by 20%.  5. Iron deficiency anemia: -- % sat: 10, Ferritin: 22 along with component of chronic disease -- Patient poorly tolerated PO Iron before with severe constipation -- Will schedule for IV Iron to help with faster symptomatic improvement and hematopoiesis   Orders Placed This Encounter  Procedures   CT CHEST ABDOMEN PELVIS W CONTRAST    Standing Status:   Future    Standing Expiration Date:   10/24/2023    Order Specific Question:   If indicated for the ordered procedure, I authorize the administration of contrast media per Radiology protocol    Answer:   Yes    Order Specific Question:   Does the patient have a contrast media/X-ray dye allergy?    Answer:   No    Order Specific Question:   Preferred imaging location?    Answer:   [facility]  Order Specific Question:   If indicated for the ordered procedure, I authorize the administration of oral contrast media per Radiology protocol    Answer:   Yes     Cindie Crumbly, MD   9/25/20249:49 AM  CHIEF COMPLAINT:   Diagnosis: Peritoneal carcinomatosis/ovarian cancer    Cancer Staging  No matching staging information was found  for the patient.    Prior Therapy: none  Current Therapy:  Neoadjuvant carboplatin and paclitaxel   HISTORY OF PRESENT ILLNESS:   Oncology History  Carcinomatosis (HCC)  08/31/2022 Initial Diagnosis   Carcinomatosis (HCC)   09/12/2022 -  Chemotherapy   Patient is on Treatment Plan : OVARIAN Carboplatin (AUC 6) + Paclitaxel (175) q21d X 6 Cycles        INTERVAL HISTORY:   Rachel Carlson is a 77 y.o. female presenting to clinic today for follow up of Peritoneal carcinomatosis/ovarian cancer . She was last seen by Dr.Katragadda on 09/24/22.  Since her last visit, she has no significant change.  She reports feeling weak and fatigued but not restricting her from daily activities.  She is still primary caretaker for her husband who has ALS.  She reports doing well.  Patient was tearful today talking about prognosis of the disease.  We discussed that we will repeat a CT scan before next cycle and determine her response to treatment.  All questions and concerns were answered and emotional support was provided.  Today, she states that she is doing well overall. Her appetite level is at 65%. Her energy level is at 60%.  PAST MEDICAL HISTORY:   Past Medical History: 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)    Esophageal stricture    Family history of breast cancer    Family history of colon cancer    Family history of melanoma    Family history of prostate cancer    Fibroids 1998   uterine, history of (left ovaries)   GERD (gastroesophageal reflux disease)    History of uterine prolapse    Hx of adenomatous polyp of colon 10/19/2014   Hyperlipidemia    Obesity    PVC (premature ventricular contraction)    first dx by holter in 1980's, echo (6/10) EF 55-60%, normal diastolic fxn, normal size RV and fxn, mild MR, PASP   Squamous cell carcinoma of arm     Tubal pregnancy    Ulcerative proctosigmoiditis (HCC)    Urge urinary incontinence 06/03/2007   Qualifier: Diagnosis of  By: Lindwood Qua CMA, Jerl Santos      Surgical History: Past Surgical History:  Procedure Laterality Date   BLADDER NECK SUSPENSION  1998   BREAST BIOPSY Left    BREAST LUMPECTOMY Left    benign   CATARACT EXTRACTION Right 01/2020   CATARACT EXTRACTION W/PHACO Left 08/12/2020   Procedure: CATARACT EXTRACTION PHACO AND INTRAOCULAR LENS PLACEMENT LEFT EYE;  Surgeon: Fabio Pierce, MD;  Location: [facility];  Service: Ophthalmology;  Laterality: Left;  left CDE=10.71   COLONOSCOPY  05/18/2011/10/12/14   Dr. Stan Head   ECTOPIC PREGNANCY SURGERY  1980   ESOPHAGOGASTRODUODENOSCOPY (EGD) WITH ESOPHAGEAL DILATION  04/03/2012   Dr. Stan Head   IR IMAGING GUIDED PORT INSERTION  09/06/2022   PARTIAL HYSTERECTOMY     abdominal, fibroids, and prolapse (1998) bladder tack   SKIN CANCER EXCISION  UPPER GASTROINTESTINAL ENDOSCOPY      Social History: Social History   Socioeconomic History   Marital status: Married    Spouse name: Not on file   Number of children: 3   Years of education: Not on file   Highest education level: Not on file  Occupational History   Occupation: International aid/development worker    Comment: retired 2009    Employer: unemployed  Tobacco Use   Smoking status: Never   Smokeless tobacco: Never  Vaping Use   Vaping status: Never Used  Substance and Sexual Activity   Alcohol use: No    Alcohol/week: 0.0 standard drinks of alcohol    Comment: rarely beer   Drug use: No   Sexual activity: Not Currently  Other Topics Concern   Not on file  Social History Narrative   Retired International aid/development worker, 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)   Grown children   Never smoker no alcohol tobacco or drug use   Social Determinants of Corporate investment banker Strain: Low Risk  (08/28/2022)   Overall Financial Resource Strain (CARDIA)    Difficulty of Paying Living  Expenses: Not hard at all  Food Insecurity: No Food Insecurity (08/28/2022)   Hunger Vital Sign    Worried About Running Out of Food in the Last Year: Never true    Ran Out of Food in the Last Year: Never true  Transportation Needs: No Transportation Needs (08/28/2022)   PRAPARE - Administrator, Civil Service (Medical): No    Lack of Transportation (Non-Medical): No  Physical Activity: Inactive (08/28/2022)   Exercise Vital Sign    Days of Exercise per Week: 0 days    Minutes of Exercise per Session: 0 min  Stress: No Stress Concern Present (08/28/2022)   [employer] of Occupational Health - Occupational Stress Questionnaire    Feeling of Stress : Not at all  Social Connections: Moderately Integrated (08/28/2022)   Social Connection and Isolation Panel [NHANES]    Frequency of Communication with Friends and Family: More than three times a week    Frequency of Social Gatherings with Friends and Family: More than three times a week    Attends Religious Services: Never    Database administrator or Organizations: Yes    Attends Engineer, structural: More than 4 times per year    Marital Status: Married  Catering manager Violence: Not At Risk (08/28/2022)   Humiliation, Afraid, Rape, and Kick questionnaire    Fear of Current or Ex-Partner: No    Emotionally Abused: No    Physically Abused: No    Sexually Abused: No    Family History: Family History  Problem Relation Age of Onset   Sudden death Father 57       "coronary arteriosclerosis" on death certificate   Heart attack Mother 44   Transient ischemic attack Mother    Diabetes type II Mother    Sudden death Mother        died age 39   Diabetes Mother    Coronary artery disease Mother    Skin cancer Mother    Barrett's esophagus Mother    Arrhythmia Brother    Colon cancer Brother 96   Breast cancer Sister        dx in her 79s   Colon polyps Sister        adenomatous   Colon cancer Son 57        lynch syndrome  Melanoma Maternal Uncle    Congestive Heart Failure Maternal Grandmother    Prostate cancer Maternal Grandfather        dx in 6s   Tuberculosis Paternal Grandfather    Skin cancer Maternal Aunt    Barrett's esophagus Maternal Aunt    Other Son        parotid gland tumor   Ehlers-Danlos syndrome Daughter    Colon cancer Cousin        mid 21s; maternal cousin   Colon cancer Other        MGMs brother   Esophageal cancer Neg Hx    Stomach cancer Neg Hx    Rectal cancer Neg Hx     Current Medications:  Current Outpatient Medications:    apixaban (ELIQUIS) 5 MG TABS tablet, Take 1 tablet (5 mg total) by mouth 2 (two) times daily., Disp: 60 tablet, Rfl: 2   Ascorbic Acid (VITAMIN C) 1000 MG tablet, Take 1,000 mg by mouth daily., Disp: , Rfl:    calcium citrate-vitamin D (CITRACAL+D) 315-200 MG-UNIT per tablet, Take 1 tablet by mouth 2 (two) times daily., Disp: , Rfl:    CARBOPLATIN IV, Inject into the vein every 21 ( twenty-one) days., Disp: , Rfl:    Cholecalciferol (VITAMIN D-3) 5000 UNITS TABS, Take 5,000 Units by mouth daily., Disp: , Rfl:    Coenzyme Q10 (CO Q-10) 200 MG CAPS, Take 200 mg by mouth daily., Disp: , Rfl:    CRANBERRY PO, Take 650 mg by mouth daily., Disp: , Rfl:    estradiol (ESTRACE) 0.1 MG/GM vaginal cream, Place 0.5 g vaginally 2 (two) times a week., Disp: 42.5 g, Rfl: 11   Glucosamine-Chondroitin (GLUCOSAMINE CHONDR COMPLEX PO), Take 2 capsules by mouth daily. , Disp: , Rfl:    GRAPE SEED EXTRACT PO, Take 1 capsule by mouth daily., Disp: , Rfl:    hydrocortisone (ANUSOL-HC) 2.5 % rectal cream, Place 1 Application rectally 2 (two) times daily., Disp: 30 g, Rfl: 1   hydrocortisone (CORTENEMA) 100 MG/60ML enema, Use 60ml every night for 28 days, Disp: 1680 mL, Rfl: 0   ketoconazole (NIZORAL) 2 % cream, Apply 1 Application topically daily. To yeast rash, Disp: 30 g, Rfl: 1   lansoprazole (PREVACID) 15 MG capsule, Take 1 capsule (15 mg total) by mouth  2 (two) times daily before a meal. (Patient taking differently: Take 15 mg by mouth daily. Uses over the counter brand), Disp: , Rfl:    LIALDA 1.2 g EC tablet, Take 2 tablets by mouth twice daily, Disp: 120 tablet, Rfl: 2   meclizine (ANTIVERT) 25 MG tablet, Take 1 tablet (25 mg total) by mouth 3 (three) times daily as needed for dizziness or nausea (vertigo)., Disp: 20 tablet, Rfl: 0   milk thistle 175 MG tablet, Take 175 mg by mouth daily., Disp: , Rfl:    Omega-3 Fatty Acids (FISH OIL TRIPLE STRENGTH PO), Take 1 capsule by mouth daily., Disp: , Rfl:    PACLITAXEL IV, Inject into the vein every 21 ( twenty-one) days., Disp: , Rfl:    Polyethyl Glycol-Propyl Glycol (SYSTANE OP), Place 1 drop into both eyes daily., Disp: , Rfl:    rosuvastatin (CRESTOR) 20 MG tablet, Take 1 tablet (20 mg total) by mouth at bedtime., Disp: 90 tablet, Rfl: 3   vitamin B-12 (CYANOCOBALAMIN) 1000 MCG tablet, Take 1,000 mcg by mouth daily., Disp: , Rfl:    zinc gluconate 50 MG tablet, Take 50 mg by mouth daily., Disp: , Rfl:  lidocaine-prilocaine (EMLA) cream, Use on vulva/ urethra as needed for pain. (Patient not taking: Reported on 10/24/2022), Disp: 30 g, Rfl: 1   lidocaine-prilocaine (EMLA) cream, Apply to affected area once (Patient not taking: Reported on 10/24/2022), Disp: 30 g, Rfl: 3   ondansetron (ZOFRAN) 8 MG tablet, Take 1 tablet (8 mg total) by mouth every 8 (eight) hours as needed for nausea or vomiting. Start on the third day after carboplatin. (Patient not taking: Reported on 10/24/2022), Disp: 30 tablet, Rfl: 1   prochlorperazine (COMPAZINE) 10 MG tablet, Take 1 tablet (10 mg total) by mouth every 6 (six) hours as needed for nausea or vomiting. (Patient not taking: Reported on 10/24/2022), Disp: 90 tablet, Rfl: 3   Allergies: Allergies  Allergen Reactions   Pantoprazole Sodium Diarrhea    Abdominal pain   Ranitidine Diarrhea    abdominal pain   Omeprazole Diarrhea    Abdominal pain   Sulfasalazine  Rash    Fever, chills, headache, muscle pain    REVIEW OF SYSTEMS:   Review of Systems  Constitutional:  Positive for appetite change and fatigue. Negative for chills and fever.  HENT:   Negative for lump/mass, mouth sores, nosebleeds, sore throat and trouble swallowing.   Eyes:  Negative for eye problems.  Respiratory:  Negative for cough and shortness of breath.   Cardiovascular:  Negative for chest pain, leg swelling and palpitations.  Gastrointestinal:  Positive for constipation and nausea. Negative for abdominal pain, diarrhea and vomiting.  Genitourinary:  Negative for bladder incontinence, difficulty urinating, dysuria, frequency, hematuria and nocturia.   Musculoskeletal:  Positive for back pain. Negative for arthralgias, flank pain, myalgias and neck pain.  Skin:  Negative for itching and rash.  Neurological:  Positive for numbness. Negative for dizziness and headaches.  Hematological:  Does not bruise/bleed easily.  Psychiatric/Behavioral:  Negative for depression, sleep disturbance and suicidal ideas. The patient is not nervous/anxious.   All other systems reviewed and are negative.    VITALS:   Weight 174 lb (78.9 kg), last menstrual period 05/29/1996.  Wt Readings from Last 3 Encounters:  10/24/22 174 lb (78.9 kg)  10/03/22 176 lb 8 oz (80.1 kg)  09/24/22 175 lb 3.2 oz (79.5 kg)    Body mass index is 32.88 kg/m.  Performance status (ECOG): 1 - Symptomatic but completely ambulatory  PHYSICAL EXAM:   Physical Exam Vitals and nursing note reviewed. Exam conducted with a chaperone present.  Constitutional:      Appearance: Normal appearance.  Cardiovascular:     Rate and Rhythm: Normal rate and regular rhythm.     Pulses: Normal pulses.     Heart sounds: Normal heart sounds.  Pulmonary:     Effort: Pulmonary effort is normal.     Breath sounds: Normal breath sounds.  Abdominal:     General: There is distension.     Palpations: Abdomen is soft. There is no  hepatomegaly, splenomegaly or mass.     Tenderness: There is no abdominal tenderness.  Musculoskeletal:     Right lower leg: No edema.     Left lower leg: No edema.  Lymphadenopathy:     Cervical: No cervical adenopathy.     Right cervical: No superficial, deep or posterior cervical adenopathy.    Left cervical: No superficial, deep or posterior cervical adenopathy.     Upper Body:     Right upper body: No supraclavicular or axillary adenopathy.     Left upper body: No supraclavicular or axillary adenopathy.  Neurological:     General: No focal deficit present.     Mental Status: She is alert and oriented to person, place, and time.  Psychiatric:        Mood and Affect: Mood normal.        Behavior: Behavior normal.     LABS:      Latest Ref Rng & Units 10/24/2022    8:01 AM 10/03/2022    8:04 AM 09/24/2022    8:02 AM  CBC  WBC 4.0 - 10.5 K/uL 5.2  9.4  14.5   Hemoglobin 12.0 - 15.0 g/dL 16.1  09.6  04.5   Hematocrit 36.0 - 46.0 % 33.6  34.4  38.6   Platelets 150 - 400 K/uL 237  348  210       Latest Ref Rng & Units 10/03/2022    8:04 AM 09/24/2022    8:02 AM 09/12/2022    7:51 AM  CMP  Glucose 70 - 99 mg/dL 409  811  914   BUN 8 - 23 mg/dL 17  8  8    Creatinine 0.44 - 1.00 mg/dL 7.82  9.56  2.13   Sodium 135 - 145 mmol/L 136  136  136   Potassium 3.5 - 5.1 mmol/L 3.9  3.7  3.7   Chloride 98 - 111 mmol/L 102  105  105   CO2 22 - 32 mmol/L 21  23  21    Calcium 8.9 - 10.3 mg/dL 9.2  8.7  8.4   Total Protein 6.5 - 8.1 g/dL 6.3  6.2  5.8   Total Bilirubin 0.3 - 1.2 mg/dL 0.6  0.6  0.5   Alkaline Phos 38 - 126 U/L 55  99  50   AST 15 - 41 U/L 20  16  17    ALT 0 - 44 U/L 14  14  11       Lab Results  Component Value Date   CEA1 1.0 08/26/2022   /  CEA  Date Value Ref Range Status  08/26/2022 1.0 0.0 - 4.7 ng/mL Final    Comment:    (NOTE)                             Nonsmokers          <3.9                             Smokers             <5.6 [employer]  Electrochemiluminescence Immunoassay (ECLIA) Values obtained with different assay methods or kits cannot be used interchangeably.  Results cannot be interpreted as absolute evidence of the presence or absence of malignant disease. Performed At: [facility] [address] [place], [place] [id number] Jolene Schimke MD [id number]    Lab Results  Component Value Date   WNU272 7,356.0 (H) 08/26/2022    No results found for: "TOTALPROTELP", "ALBUMINELP", "A1GS", "A2GS", "BETS", "BETA2SER", "GAMS", "MSPIKE", "SPEI" Lab Results  Component Value Date   TIBC 369 10/24/2022   FERRITIN 22 10/24/2022   IRONPCTSAT 10 (L) 10/24/2022

## 2022-10-24 NOTE — Progress Notes (Signed)
Port flushed and labs drawn. Port accessed for treatment

## 2022-10-24 NOTE — Patient Instructions (Signed)
[facility]  Discharge Instructions: Thank you for choosing [facility] to provide your oncology and hematology care.  If you have a lab appointment with the Cancer Center - please note that after April 8th, 2024, all labs will be drawn in the cancer center.  You do not have to check in or register with the main entrance as you have in the past but will complete your check-in in the cancer center.  Wear comfortable clothing and clothing appropriate for easy access to any Portacath or PICC line.   We strive to give you quality time with your provider. You may need to reschedule your appointment if you arrive late (15 or more minutes).  Arriving late affects you and other patients whose appointments are after yours.  Also, if you miss three or more appointments without notifying the office, you may be dismissed from the clinic at the provider's discretion.      For prescription refill requests, have your pharmacy contact our office and allow 72 hours for refills to be completed.    Today you received the following chemotherapy and/or immunotherapy agents Taxol/Carboplatin      To help prevent nausea and vomiting after your treatment, we encourage you to take your nausea medication as directed.  BELOW ARE SYMPTOMS THAT SHOULD BE REPORTED IMMEDIATELY: *FEVER GREATER THAN 100.4 F (38 C) OR HIGHER *CHILLS OR SWEATING *NAUSEA AND VOMITING THAT IS NOT CONTROLLED WITH YOUR NAUSEA MEDICATION *UNUSUAL SHORTNESS OF BREATH *UNUSUAL BRUISING OR BLEEDING *URINARY PROBLEMS (pain or burning when urinating, or frequent urination) *BOWEL PROBLEMS (unusual diarrhea, constipation, pain near the anus) TENDERNESS IN MOUTH AND THROAT WITH OR WITHOUT PRESENCE OF ULCERS (sore throat, sores in mouth, or a toothache) UNUSUAL RASH, SWELLING OR PAIN  UNUSUAL VAGINAL DISCHARGE OR ITCHING   Items with * indicate a potential emergency and should be followed up as soon as possible or go  to the Emergency Department if any problems should occur.  Please show the CHEMOTHERAPY ALERT CARD or IMMUNOTHERAPY ALERT CARD at check-in to the Emergency Department and triage nurse.  Should you have questions after your visit or need to cancel or reschedule your appointment, please contact [facility] [phone]  and follow the prompts.  Office hours are 8:00 a.m. to 4:30 p.m. Monday - Friday. Please note that voicemails left after 4:00 p.m. may not be returned until the following business day.  We are closed weekends and major holidays. You have access to a nurse at all times for urgent questions. Please call the main number to the clinic [phone] and follow the prompts.  For any non-urgent questions, you may also contact your provider using MyChart. We now offer e-Visits for anyone 13 and older to request care online for non-urgent symptoms. For details visit [email or web address].   Also download the MyChart app! Go to the app store, search "MyChart", open the app, select , and log in with your MyChart username and password.

## 2022-10-24 NOTE — Progress Notes (Signed)
Patient presents today for Taxol/Carboplatin infusion per providers order.  Vital signs and labs within parameters for treatment.  Patient has no new complaints at this time.  Treatment given today per MD orders.  Stable during infusion without adverse affects.  Vital signs stable.  No complaints at this time.  Discharge from clinic ambulatory in stable condition.  Alert and oriented X 3.  Follow up with [facility] as scheduled.

## 2022-10-25 ENCOUNTER — Telehealth: Payer: Self-pay | Admitting: Oncology

## 2022-10-25 DIAGNOSIS — D509 Iron deficiency anemia, unspecified: Secondary | ICD-10-CM | POA: Insufficient documentation

## 2022-10-25 LAB — CA 125: Cancer Antigen (CA) 125: 4916 U/mL — ABNORMAL HIGH (ref 0.0–38.1)

## 2022-10-25 NOTE — Telephone Encounter (Signed)
Called Rachel Carlson and scheduled appointment with Dr. Pricilla Holm on 11/09/22 at 8:00 to discuss surgery.  Also advised her of new CT scan appointment on 11/07/22 at [facility] with arrival at 10:45 to drink the contrast.  She verbalized understanding and agreement of appointments and instructions.

## 2022-10-26 ENCOUNTER — Inpatient Hospital Stay: Payer: Medicare Other

## 2022-10-29 ENCOUNTER — Inpatient Hospital Stay: Payer: Medicare Other

## 2022-10-29 VITALS — BP 120/51 | HR 97 | Temp 97.6°F | Resp 18 | Ht 61.5 in | Wt 170.0 lb

## 2022-10-29 DIAGNOSIS — K219 Gastro-esophageal reflux disease without esophagitis: Secondary | ICD-10-CM | POA: Diagnosis not present

## 2022-10-29 DIAGNOSIS — I82409 Acute embolism and thrombosis of unspecified deep veins of unspecified lower extremity: Secondary | ICD-10-CM | POA: Diagnosis not present

## 2022-10-29 DIAGNOSIS — K222 Esophageal obstruction: Secondary | ICD-10-CM | POA: Diagnosis not present

## 2022-10-29 DIAGNOSIS — Z5111 Encounter for antineoplastic chemotherapy: Secondary | ICD-10-CM | POA: Diagnosis not present

## 2022-10-29 DIAGNOSIS — Z79899 Other long term (current) drug therapy: Secondary | ICD-10-CM | POA: Diagnosis not present

## 2022-10-29 DIAGNOSIS — D509 Iron deficiency anemia, unspecified: Secondary | ICD-10-CM | POA: Diagnosis not present

## 2022-10-29 DIAGNOSIS — Z7901 Long term (current) use of anticoagulants: Secondary | ICD-10-CM | POA: Diagnosis not present

## 2022-10-29 DIAGNOSIS — R971 Elevated cancer antigen 125 [CA 125]: Secondary | ICD-10-CM | POA: Diagnosis not present

## 2022-10-29 DIAGNOSIS — Z800 Family history of malignant neoplasm of digestive organs: Secondary | ICD-10-CM | POA: Diagnosis not present

## 2022-10-29 DIAGNOSIS — Z808 Family history of malignant neoplasm of other organs or systems: Secondary | ICD-10-CM | POA: Diagnosis not present

## 2022-10-29 DIAGNOSIS — R188 Other ascites: Secondary | ICD-10-CM | POA: Diagnosis not present

## 2022-10-29 DIAGNOSIS — G620 Drug-induced polyneuropathy: Secondary | ICD-10-CM | POA: Diagnosis not present

## 2022-10-29 DIAGNOSIS — K5130 Ulcerative (chronic) rectosigmoiditis without complications: Secondary | ICD-10-CM | POA: Diagnosis not present

## 2022-10-29 DIAGNOSIS — E785 Hyperlipidemia, unspecified: Secondary | ICD-10-CM | POA: Diagnosis not present

## 2022-10-29 DIAGNOSIS — R110 Nausea: Secondary | ICD-10-CM | POA: Diagnosis not present

## 2022-10-29 MED ORDER — HEPARIN SOD (PORK) LOCK FLUSH 100 UNIT/ML IV SOLN
500.0000 [IU] | Freq: Once | INTRAVENOUS | Status: AC | PRN
  Administered 2022-10-29: 500 [IU]

## 2022-10-29 MED ORDER — SODIUM CHLORIDE 0.9% FLUSH
10.0000 mL | Freq: Once | INTRAVENOUS | Status: AC | PRN
  Administered 2022-10-29: 10 mL

## 2022-10-29 MED ORDER — SODIUM CHLORIDE 0.9 % IV SOLN
510.0000 mg | Freq: Once | INTRAVENOUS | Status: AC
  Administered 2022-10-29: 510 mg via INTRAVENOUS
  Filled 2022-10-29: qty 510

## 2022-10-29 MED ORDER — SODIUM CHLORIDE 0.9 % IV SOLN: Freq: Once | INTRAVENOUS | Status: AC

## 2022-10-29 MED ORDER — CETIRIZINE HCL 10 MG PO TABS
10.0000 mg | ORAL_TABLET | Freq: Once | ORAL | Status: AC
  Administered 2022-10-29: 10 mg via ORAL
  Filled 2022-10-29: qty 1

## 2022-10-29 MED ORDER — ACETAMINOPHEN 325 MG PO TABS
650.0000 mg | ORAL_TABLET | Freq: Once | ORAL | Status: AC
  Administered 2022-10-29: 650 mg via ORAL
  Filled 2022-10-29: qty 2

## 2022-10-29 NOTE — Progress Notes (Signed)
Patient presents today for Feraheme infusion per providers order.  Vital signs WNL.  Patient has no new complaints at this time.  Stable during infusion without adverse affects.  Vital signs stable.  No complaints at this time.  Discharge from clinic ambulatory in stable condition.  Alert and oriented X 3.  Follow up with [facility] as scheduled.  

## 2022-10-29 NOTE — Patient Instructions (Signed)
[facility]  Discharge Instructions: Thank you for choosing [facility] to provide your oncology and hematology care.  If you have a lab appointment with the Cancer Center - please note that after April 8th, 2024, all labs will be drawn in the cancer center.  You do not have to check in or register with the main entrance as you have in the past but will complete your check-in in the cancer center.  Wear comfortable clothing and clothing appropriate for easy access to any Portacath or PICC line.   We strive to give you quality time with your provider. You may need to reschedule your appointment if you arrive late (15 or more minutes).  Arriving late affects you and other patients whose appointments are after yours.  Also, if you miss three or more appointments without notifying the office, you may be dismissed from the clinic at the provider's discretion.      For prescription refill requests, have your pharmacy contact our office and allow 72 hours for refills to be completed.    Today you received the following chemotherapy and/or immunotherapy agents Feraheme      To help prevent nausea and vomiting after your treatment, we encourage you to take your nausea medication as directed.  BELOW ARE SYMPTOMS THAT SHOULD BE REPORTED IMMEDIATELY: *FEVER GREATER THAN 100.4 F (38 C) OR HIGHER *CHILLS OR SWEATING *NAUSEA AND VOMITING THAT IS NOT CONTROLLED WITH YOUR NAUSEA MEDICATION *UNUSUAL SHORTNESS OF BREATH *UNUSUAL BRUISING OR BLEEDING *URINARY PROBLEMS (pain or burning when urinating, or frequent urination) *BOWEL PROBLEMS (unusual diarrhea, constipation, pain near the anus) TENDERNESS IN MOUTH AND THROAT WITH OR WITHOUT PRESENCE OF ULCERS (sore throat, sores in mouth, or a toothache) UNUSUAL RASH, SWELLING OR PAIN  UNUSUAL VAGINAL DISCHARGE OR ITCHING   Items with * indicate a potential emergency and should be followed up as soon as possible or go to the  Emergency Department if any problems should occur.  Please show the CHEMOTHERAPY ALERT CARD or IMMUNOTHERAPY ALERT CARD at check-in to the Emergency Department and triage nurse.  Should you have questions after your visit or need to cancel or reschedule your appointment, please contact [facility] [phone]  and follow the prompts.  Office hours are 8:00 a.m. to 4:30 p.m. Monday - Friday. Please note that voicemails left after 4:00 p.m. may not be returned until the following business day.  We are closed weekends and major holidays. You have access to a nurse at all times for urgent questions. Please call the main number to the clinic [phone] and follow the prompts.  For any non-urgent questions, you may also contact your provider using MyChart. We now offer e-Visits for anyone 18 and older to request care online for non-urgent symptoms. For details visit [email or web address].   Also download the MyChart app! Go to the app store, search "MyChart", open the app, select [place], and log in with your MyChart username and password.   

## 2022-11-01 ENCOUNTER — Emergency Department (HOSPITAL_COMMUNITY)
Admission: EM | Admit: 2022-11-01 | Discharge: 2022-11-01 | Disposition: A | Discharge status: Home / Self Care | Payer: Medicare Other | Attending: Emergency Medicine | Admitting: Emergency Medicine

## 2022-11-01 ENCOUNTER — Telehealth: Payer: Self-pay | Admitting: *Deleted

## 2022-11-01 ENCOUNTER — Emergency Department (HOSPITAL_COMMUNITY): Payer: Medicare Other

## 2022-11-01 ENCOUNTER — Other Ambulatory Visit: Payer: Self-pay

## 2022-11-01 ENCOUNTER — Encounter (HOSPITAL_COMMUNITY): Payer: Self-pay

## 2022-11-01 DIAGNOSIS — Z7901 Long term (current) use of anticoagulants: Secondary | ICD-10-CM | POA: Insufficient documentation

## 2022-11-01 DIAGNOSIS — Z1152 Encounter for screening for COVID-19: Secondary | ICD-10-CM | POA: Diagnosis not present

## 2022-11-01 DIAGNOSIS — C569 Malignant neoplasm of unspecified ovary: Secondary | ICD-10-CM | POA: Diagnosis not present

## 2022-11-01 DIAGNOSIS — J90 Pleural effusion, not elsewhere classified: Secondary | ICD-10-CM | POA: Diagnosis not present

## 2022-11-01 DIAGNOSIS — J181 Lobar pneumonia, unspecified organism: Secondary | ICD-10-CM | POA: Insufficient documentation

## 2022-11-01 DIAGNOSIS — K449 Diaphragmatic hernia without obstruction or gangrene: Secondary | ICD-10-CM | POA: Diagnosis not present

## 2022-11-01 DIAGNOSIS — R509 Fever, unspecified: Secondary | ICD-10-CM | POA: Diagnosis present

## 2022-11-01 DIAGNOSIS — R918 Other nonspecific abnormal finding of lung field: Secondary | ICD-10-CM | POA: Diagnosis not present

## 2022-11-01 DIAGNOSIS — J984 Other disorders of lung: Secondary | ICD-10-CM | POA: Diagnosis not present

## 2022-11-01 DIAGNOSIS — J189 Pneumonia, unspecified organism: Secondary | ICD-10-CM

## 2022-11-01 LAB — COMPREHENSIVE METABOLIC PANEL
ALT: 14 U/L (ref 0–44)
AST: 17 U/L (ref 15–41)
Albumin: 3.8 g/dL (ref 3.5–5.0)
Alkaline Phosphatase: 59 U/L (ref 38–126)
Anion gap: 9 (ref 5–15)
BUN: 19 mg/dL (ref 8–23)
CO2: 23 mmol/L (ref 22–32)
Calcium: 9.3 mg/dL (ref 8.9–10.3)
Chloride: 102 mmol/L (ref 98–111)
Creatinine, Ser: 0.62 mg/dL (ref 0.44–1.00)
GFR, Estimated: >60 mL/min (ref >60)
Glucose, Bld: 107 mg/dL — ABNORMAL HIGH (ref 70–99)
Potassium: 3.8 mmol/L (ref 3.5–5.1)
Sodium: 134 mmol/L — ABNORMAL LOW (ref 135–145)
Total Bilirubin: 0.4 mg/dL (ref 0.3–1.2)
Total Protein: 6.9 g/dL (ref 6.5–8.1)

## 2022-11-01 LAB — CBC WITH DIFFERENTIAL/PLATELET
Abs Immature Granulocytes: 0.01 10*3/uL (ref 0.00–0.07)
Basophils Absolute: 0 10*3/uL (ref 0.0–0.1)
Basophils Relative: 1 %
Eosinophils Absolute: 0 10*3/uL (ref 0.0–0.5)
Eosinophils Relative: 0 %
HCT: 32 % — ABNORMAL LOW (ref 36.0–46.0)
Hemoglobin: 10.3 g/dL — ABNORMAL LOW (ref 12.0–15.0)
Immature Granulocytes: 0 %
Lymphocytes Relative: 13 %
Lymphs Abs: 0.5 10*3/uL — ABNORMAL LOW (ref 0.7–4.0)
MCH: 28.1 pg (ref 26.0–34.0)
MCHC: 32.2 g/dL (ref 30.0–36.0)
MCV: 87.2 fL (ref 80.0–100.0)
Monocytes Absolute: 0.2 10*3/uL (ref 0.1–1.0)
Monocytes Relative: 5 %
Neutro Abs: 3.5 10*3/uL (ref 1.7–7.7)
Neutrophils Relative %: 81 %
Platelets: 161 10*3/uL (ref 150–400)
RBC: 3.67 MIL/uL — ABNORMAL LOW (ref 3.87–5.11)
RDW: 17 % — ABNORMAL HIGH (ref 11.5–15.5)
WBC: 4.2 10*3/uL (ref 4.0–10.5)
nRBC: 0 % (ref 0.0–0.2)

## 2022-11-01 LAB — URINALYSIS, ROUTINE W REFLEX MICROSCOPIC
Bilirubin Urine: NEGATIVE
Glucose, UA: NEGATIVE mg/dL
Hgb urine dipstick: NEGATIVE
Ketones, ur: NEGATIVE mg/dL
Leukocytes,Ua: NEGATIVE
Nitrite: NEGATIVE
Protein, ur: NEGATIVE mg/dL
Specific Gravity, Urine: 1.02 (ref 1.005–1.030)
pH: 5 (ref 5.0–8.0)

## 2022-11-01 LAB — RESP PANEL BY RT-PCR (RSV, FLU A&B, COVID)  RVPGX2
Influenza A by PCR: NEGATIVE
Influenza B by PCR: NEGATIVE
Resp Syncytial Virus by PCR: NEGATIVE
SARS Coronavirus 2 by RT PCR: NEGATIVE

## 2022-11-01 LAB — LACTIC ACID, PLASMA: Lactic Acid, Venous: 0.8 mmol/L (ref 0.5–1.9)

## 2022-11-01 MED ORDER — DOXYCYCLINE HYCLATE 100 MG PO CAPS: 100.0000 mg | ORAL_CAPSULE | Freq: Two times a day (BID) | ORAL | 0 refills | Status: DC

## 2022-11-01 MED ORDER — ACETAMINOPHEN 500 MG PO TABS
1000.0000 mg | ORAL_TABLET | Freq: Once | ORAL | Status: AC
  Administered 2022-11-01: 1000 mg via ORAL
  Filled 2022-11-01: qty 2

## 2022-11-01 MED ORDER — SODIUM CHLORIDE 0.9 % IV SOLN
1.0000 g | Freq: Once | INTRAVENOUS | Status: AC
  Administered 2022-11-01: 1 g via INTRAVENOUS
  Filled 2022-11-01: qty 10

## 2022-11-01 MED ORDER — DOXYCYCLINE HYCLATE 100 MG PO TABS
100.0000 mg | ORAL_TABLET | Freq: Once | ORAL | Status: AC
  Administered 2022-11-01: 100 mg via ORAL
  Filled 2022-11-01: qty 1

## 2022-11-01 NOTE — ED Notes (Signed)
Pt ambulated around desk without assistance O2 steady at 96-97% RA Pt states no SOB

## 2022-11-01 NOTE — Telephone Encounter (Addendum)
Patient called with c/o cough and fever of 100.8 and generalized profound malaise.  Recommended that she be evaluated in the ER.  Verbalized understanding.  Last chemotherapy treatment on 9/25.

## 2022-11-01 NOTE — ED Triage Notes (Signed)
C/o fever and cough with temp max of 101 at home.  Last chemo tx on 9/25.

## 2022-11-01 NOTE — ED Provider Notes (Signed)
[place] EMERGENCY DEPARTMENT AT [facility] Provider Note   CSN: [id number] Arrival date & time: 11/01/22  1857     History  Chief Complaint  Patient presents with   Fever    Rachel Carlson is a 77 y.o. female.  Pt is a 77 yo female with pmhx significant for hld, obesity, gerd, dvt (on Eliquis), iron deficiency anemia, and peritoneal carcinomatosis/ovarian cancer.  She is getting chemo for her cancer.  Last chemo was on 9/25.  Pt developed a fever and cough today.  She called the cancer center and was told to come to the ED.  Pt has not taken anything for her fever since early this am.       Home Medications Prior to Admission medications   Medication Sig Start Date End Date Taking? Authorizing Provider  apixaban (ELIQUIS) 5 MG TABS tablet Take 1 tablet (5 mg total) by mouth 2 (two) times daily. 08/20/22  Yes Tower, Audrie Gallus, MD  Ascorbic Acid (VITAMIN C) 1000 MG tablet Take 1,000 mg by mouth daily.   Yes [provider]  calcium citrate-vitamin D (CITRACAL+D) 315-200 MG-UNIT per tablet Take 1 tablet by mouth 2 (two) times daily.   Yes [provider]  CARBOPLATIN IV Inject into the vein every 21 ( twenty-one) days. port 09/12/22  Yes [provider]  Cholecalciferol (VITAMIN D-3) 5000 UNITS TABS Take 5,000 Units by mouth daily.   Yes [provider]  Coenzyme Q10 (CO Q-10) 200 MG CAPS Take 200 mg by mouth daily.   Yes [provider]  CRANBERRY PO Take 650 mg by mouth daily.   Yes [provider]  doxycycline (VIBRAMYCIN) 100 MG capsule Take 1 capsule (100 mg total) by mouth 2 (two) times daily. 11/01/22  Yes Jacalyn Lefevre, MD  Glucosamine-Chondroitin (GLUCOSAMINE CHONDR COMPLEX PO) Take 2 capsules by mouth daily.    Yes [provider]  GRAPE SEED EXTRACT PO Take 1 capsule by mouth daily.   Yes [provider]  ketoconazole (NIZORAL) 2 % cream Apply 1 Application topically daily. To yeast  rash 08/17/21  Yes Tower, Audrie Gallus, MD  lansoprazole (PREVACID) 15 MG capsule Take 1 capsule (15 mg total) by mouth 2 (two) times daily before a meal. Patient taking differently: Take 15 mg by mouth daily. Uses over the counter brand 09/02/17  Yes Iva Boop, MD  LIALDA 1.2 g EC tablet Take 2 tablets by mouth twice daily 07/10/22  Yes Iva Boop, MD  lidocaine-prilocaine (EMLA) cream Apply to affected area once 09/12/22  Yes Doreatha Massed, MD  milk thistle 175 MG tablet Take 175 mg by mouth daily.   Yes [provider]  Omega-3 Fatty Acids (FISH OIL TRIPLE STRENGTH PO) Take 1 capsule by mouth daily.   Yes [provider]  ondansetron (ZOFRAN) 8 MG tablet Take 1 tablet (8 mg total) by mouth every 8 (eight) hours as needed for nausea or vomiting. Start on the third day after carboplatin. 09/12/22  Yes Doreatha Massed, MD  PACLITAXEL IV Inject into the vein every 21 ( twenty-one) days. port 09/12/22  Yes [provider]  Polyethyl Glycol-Propyl Glycol (SYSTANE OP) Place 1 drop into both eyes daily.   Yes [provider]  rosuvastatin (CRESTOR) 20 MG tablet Take 1 tablet (20 mg total) by mouth at bedtime. 08/20/22  Yes Tower, Audrie Gallus, MD  vitamin B-12 (CYANOCOBALAMIN) 1000 MCG tablet Take 1,000 mcg by mouth daily.   Yes [provider]  zinc gluconate 50 MG tablet Take 50 mg by mouth daily.   Yes [provider]      Allergies    Pantoprazole sodium, Ranitidine, Omeprazole, and Sulfasalazine    Review of Systems   Review of Systems  Constitutional:  Positive for fever.  Respiratory:  Positive for cough.   All other systems reviewed and are negative.   Physical Exam Updated Vital Signs BP 120/62   Pulse 98   Temp 100.3 F (37.9 C) (Oral)   Resp 18   Wt 77.1 kg   LMP 05/29/1996   SpO2 97%   BMI 31.60 kg/m  Physical Exam Vitals and nursing note reviewed.  Constitutional:      Appearance: Normal appearance.  HENT:      Head: Normocephalic and atraumatic.     Right Ear: External ear normal.     Left Ear: External ear normal.     Nose: Nose normal.     Mouth/Throat:     Mouth: Mucous membranes are moist.     Pharynx: Oropharynx is clear.  Eyes:     Extraocular Movements: Extraocular movements intact.     Pupils: Pupils are equal, round, and reactive to light.  Cardiovascular:     Rate and Rhythm: Normal rate and regular rhythm.     Pulses: Normal pulses.     Heart sounds: Normal heart sounds.  Pulmonary:     Effort: Pulmonary effort is normal.     Breath sounds: Normal breath sounds.  Abdominal:     General: Abdomen is flat. Bowel sounds are normal.     Palpations: Abdomen is soft.  Musculoskeletal:        General: Normal range of motion.     Cervical back: Normal range of motion and neck supple.  Skin:    General: Skin is warm.     Capillary Refill: Capillary refill takes less than 2 seconds.  Neurological:     General: No focal deficit present.     Mental Status: She is alert and oriented to person, place, and time.  Psychiatric:        Mood and Affect: Mood normal.        Behavior: Behavior normal.     ED Results / Procedures / Treatments   Labs (all labs ordered are listed, but only abnormal results are displayed) Labs Reviewed  COMPREHENSIVE METABOLIC PANEL - Abnormal; Notable for the following components:      Result Value   Sodium 134 (*)    Glucose, Bld 107 (*)    All other components within normal limits  CBC WITH DIFFERENTIAL/PLATELET - Abnormal; Notable for the following components:   RBC 3.67 (*)    Hemoglobin 10.3 (*)    HCT 32.0 (*)    RDW 17.0 (*)    Lymphs Abs 0.5 (*)    All other components within normal limits  RESP PANEL BY RT-PCR (RSV, FLU A&B, COVID)  RVPGX2  CULTURE, BLOOD (ROUTINE X 2)  CULTURE, BLOOD (ROUTINE X 2)  URINALYSIS, ROUTINE W REFLEX MICROSCOPIC  LACTIC ACID, PLASMA    EKG None  Radiology DG Chest Portable 1 View  Result Date:  11/01/2022 CLINICAL DATA:  Fever cough EXAM: PORTABLE CHEST 1 VIEW COMPARISON:  CT 08/30/2022 FINDINGS: Right-sided central venous port tip over the SVC. Small right and small moderate left pleural effusion with possible loculation on the left. Airspace disease at left base. Hiatal hernia. Stable cardiomediastinal silhouette. No pneumothorax IMPRESSION: Small right and small  to moderate left pleural effusion with possible loculation on the left. Airspace disease at left base may reflect atelectasis or pneumonia. Electronically Signed   By: Jasmine Pang M.D.   On: 11/01/2022 20:52    Procedures Procedures    Medications Ordered in ED Medications  acetaminophen (TYLENOL) tablet 1,000 mg (1,000 mg Oral Given 11/01/22 2034)  cefTRIAXone (ROCEPHIN) 1 g in sodium chloride 0.9 % 100 mL IVPB (1 g Intravenous New Bag/Given 11/01/22 2134)  doxycycline (VIBRA-TABS) tablet 100 mg (100 mg Oral Given 11/01/22 2134)    ED Course/ Medical Decision Making/ A&P                                 Medical Decision Making Amount and/or Complexity of Data Reviewed Labs: ordered. Radiology: ordered.  Risk OTC drugs. Prescription drug management.   This patient presents to the ED for concern of cough/fever, this involves an extensive number of treatment options, and is a complaint that carries with it a high risk of complications and morbidity.  The differential diagnosis includes covid, flu, pna, bronchitis, sepsis   Co morbidities that complicate the patient evaluation  hld, obesity, gerd, dvt (on Eliquis), iron deficiency anemia, and peritoneal carcinomatosis/ovarian cancer   Additional history obtained:  Additional history obtained from epic chart review External records from outside source obtained and reviewed including family   Lab Tests:  I Ordered, and personally interpreted labs.  The pertinent results include:  cbc with nl wbc, hgb 10.3 (stable), cmp nl, covid/flu/rsv neg; ua  neg   Imaging Studies ordered:  I ordered imaging studies including cxr  I independently visualized and interpreted imaging which showed  Small right and small to moderate left pleural effusion with  possible loculation on the left. Airspace disease at left base may  reflect atelectasis or pneumonia.   I agree with the radiologist interpretation   Cardiac Monitoring:  The patient was maintained on a cardiac monitor.  I personally viewed and interpreted the cardiac monitored which showed an underlying rhythm of: nsr   Medicines ordered and prescription drug management:  I ordered medication including rocephin/doxy  for pna  Reevaluation of the patient after these medicines showed that the patient improved I have reviewed the patients home medicines and have made adjustments as needed   Test Considered:  ct   Critical Interventions:  abx  Problem List / ED Course:  LLL CAP:  pt is getting chemo, but wbc ok.  She is able to ambulate with O2 sats staying 96-97%.  She is given a dose of rocephin/doxy in the ED.  She is stable for d/c with outpatient f/u.  Return if worse.    Reevaluation:  After the interventions noted above, I reevaluated the patient and found that they have :improved   Social Determinants of Health:  Lives at home   Dispostion:  After consideration of the diagnostic results and the patients response to treatment, I feel that the patent would benefit from discharge with outpatient f/u.          Final Clinical Impression(s) / ED Diagnoses Final diagnoses:  Community acquired pneumonia of left lower lobe of lung    Rx / DC Orders ED Discharge Orders          Ordered    doxycycline (VIBRAMYCIN) 100 MG capsule  2 times daily        11/01/22 2220  Jacalyn Lefevre, MD 11/01/22 2220

## 2022-11-05 ENCOUNTER — Inpatient Hospital Stay: Payer: Medicare Other | Admitting: Dietician

## 2022-11-05 ENCOUNTER — Inpatient Hospital Stay: Payer: Medicare Other | Attending: Hematology

## 2022-11-05 VITALS — BP 123/75 | HR 95 | Temp 97.7°F | Resp 18

## 2022-11-05 DIAGNOSIS — Z800 Family history of malignant neoplasm of digestive organs: Secondary | ICD-10-CM | POA: Insufficient documentation

## 2022-11-05 DIAGNOSIS — R059 Cough, unspecified: Secondary | ICD-10-CM | POA: Insufficient documentation

## 2022-11-05 DIAGNOSIS — Z8042 Family history of malignant neoplasm of prostate: Secondary | ICD-10-CM | POA: Insufficient documentation

## 2022-11-05 DIAGNOSIS — Z808 Family history of malignant neoplasm of other organs or systems: Secondary | ICD-10-CM | POA: Insufficient documentation

## 2022-11-05 DIAGNOSIS — I82409 Acute embolism and thrombosis of unspecified deep veins of unspecified lower extremity: Secondary | ICD-10-CM | POA: Diagnosis not present

## 2022-11-05 DIAGNOSIS — R110 Nausea: Secondary | ICD-10-CM | POA: Diagnosis not present

## 2022-11-05 DIAGNOSIS — R971 Elevated cancer antigen 125 [CA 125]: Secondary | ICD-10-CM | POA: Diagnosis not present

## 2022-11-05 DIAGNOSIS — T451X5D Adverse effect of antineoplastic and immunosuppressive drugs, subsequent encounter: Secondary | ICD-10-CM | POA: Diagnosis not present

## 2022-11-05 DIAGNOSIS — G620 Drug-induced polyneuropathy: Secondary | ICD-10-CM | POA: Diagnosis not present

## 2022-11-05 DIAGNOSIS — C579 Malignant neoplasm of female genital organ, unspecified: Secondary | ICD-10-CM | POA: Diagnosis present

## 2022-11-05 DIAGNOSIS — D509 Iron deficiency anemia, unspecified: Secondary | ICD-10-CM | POA: Diagnosis not present

## 2022-11-05 DIAGNOSIS — Z5111 Encounter for antineoplastic chemotherapy: Secondary | ICD-10-CM | POA: Diagnosis not present

## 2022-11-05 DIAGNOSIS — J90 Pleural effusion, not elsewhere classified: Secondary | ICD-10-CM | POA: Diagnosis not present

## 2022-11-05 DIAGNOSIS — Z79899 Other long term (current) drug therapy: Secondary | ICD-10-CM | POA: Diagnosis not present

## 2022-11-05 DIAGNOSIS — R5383 Other fatigue: Secondary | ICD-10-CM | POA: Insufficient documentation

## 2022-11-05 DIAGNOSIS — Z7901 Long term (current) use of anticoagulants: Secondary | ICD-10-CM | POA: Diagnosis not present

## 2022-11-05 MED ORDER — ACETAMINOPHEN 325 MG PO TABS: 650.0000 mg | ORAL_TABLET | Freq: Once | ORAL | Status: DC

## 2022-11-05 MED ORDER — CETIRIZINE HCL 10 MG PO TABS: 10.0000 mg | ORAL_TABLET | Freq: Once | ORAL | Status: DC

## 2022-11-05 MED ORDER — SODIUM CHLORIDE 0.9 % IV SOLN: Freq: Once | INTRAVENOUS | Status: AC

## 2022-11-05 MED ORDER — HEPARIN SOD (PORK) LOCK FLUSH 100 UNIT/ML IV SOLN
500.0000 [IU] | Freq: Once | INTRAVENOUS | Status: AC
  Administered 2022-11-05: 500 [IU] via INTRAVENOUS

## 2022-11-05 MED ORDER — SODIUM CHLORIDE 0.9 % IV SOLN
510.0000 mg | Freq: Once | INTRAVENOUS | Status: AC
  Administered 2022-11-05: 510 mg via INTRAVENOUS
  Filled 2022-11-05: qty 17

## 2022-11-05 MED ORDER — SODIUM CHLORIDE 0.9% FLUSH
10.0000 mL | INTRAVENOUS | Status: DC | PRN
  Administered 2022-11-05: 10 mL

## 2022-11-05 NOTE — Progress Notes (Signed)
Nutrition Follow-up:   Pt with peritoneal carcinomatosis. She is currently receiving neoadjuvant 20% dose-reduced paclitaxel + carboplatin (start 8/12).    Recurrent abd ascites requiring paracentesis  8/2 - 7 L milky yellow fluid removed   8/20 - 3.4 L chylous milky fluid removed    Met with pt in infusion. She is receiving IV iron today. Patient reports ongoing fatigue. She is not resting well at night. Patient does not have much of an appetite. Reports 2-3 small meals. Recalls cinnamon raisin toast, egg, bacon, coffee for breakfast. Had BBQ, baked beans, potato salad for dinner last night. Patient drank a protein shake in the afternoon. Patient reports intermittent nausea with "retching." This occurred ~3AM after going back to bed from packing her son's lunch at 2AM. Patient denies constipation or diarrhea. She is on bowel regimen.     Medications: reviewed   Labs: 10/3 - Na 134, glucose 107  Anthropometrics: Last wt 170 lb on 9/30 decreased 2% in 5 days - severe for time frame  9/25 - 174 lb  9/4 - 176 lb 8 oz    NUTRITION DIAGNOSIS: Unintended wt loss continues    INTERVENTION:  Continued encouragement for increasing calorie and protein with small frequent meals/snacks Continue drinking Ensure Complete/equivalent - recommend 2/day (samples provided) Educated on strategies for nausea - handout with tips provided Discussed sleep aid with RN Chapman Moss) - will relay to Dr. Ellin Saba Continue bowel regimen Support and encouragement Monitor for treatment plan - Dr. Pricilla Holm 10/11    MONITORING, EVALUATION, GOAL: wt trends, intake   NEXT VISIT: To be scheduled in collaboration with upcoming [facility] appointments

## 2022-11-05 NOTE — Progress Notes (Signed)
Patient presents today for iron infusion. Patient is in satisfactory condition with no new complaints voiced.  Vital signs are stable.  We will proceed with infusion per provider orders.    Patient took pre-meds at home prior to arrival. Patient stated she is currently being treated for pneumonia and has a few days left on her antibiotics. Dr.K made aware and stated patient is okay to receive her IV iron infusion.   Feraheme 510 mg given today per MD orders. Tolerated infusion without adverse affects. Vital signs stable. No complaints at this time. Discharged from clinic ambulatory in stable condition. Alert and oriented x 3. F/U with [facility] as scheduled.

## 2022-11-05 NOTE — Patient Instructions (Signed)
[facility] [place]  Discharge Instructions: Thank you for choosing [facility] to provide your oncology and hematology care.  If you have a lab appointment with the Cancer Center - please note that after April 8th, 2024, all labs will be drawn in the cancer center.  You do not have to check in or register with the main entrance as you have in the past but will complete your check-in in the cancer center.  Wear comfortable clothing and clothing appropriate for easy access to any Portacath or PICC line.   We strive to give you quality time with your provider. You may need to reschedule your appointment if you arrive late (15 or more minutes).  Arriving late affects you and other patients whose appointments are after yours.  Also, if you miss three or more appointments without notifying the office, you may be dismissed from the clinic at the provider's discretion.      For prescription refill requests, have your pharmacy contact our office and allow 72 hours for refills to be completed.    Today you received Feraheme IV iron infusion.   .  BELOW ARE SYMPTOMS THAT SHOULD BE REPORTED IMMEDIATELY: *FEVER GREATER THAN 100.4 F (38 C) OR HIGHER *CHILLS OR SWEATING *NAUSEA AND VOMITING THAT IS NOT CONTROLLED WITH YOUR NAUSEA MEDICATION *UNUSUAL SHORTNESS OF BREATH *UNUSUAL BRUISING OR BLEEDING *URINARY PROBLEMS (pain or burning when urinating, or frequent urination) *BOWEL PROBLEMS (unusual diarrhea, constipation, pain near the anus) TENDERNESS IN MOUTH AND THROAT WITH OR WITHOUT PRESENCE OF ULCERS (sore throat, sores in mouth, or a toothache) UNUSUAL RASH, SWELLING OR PAIN  UNUSUAL VAGINAL DISCHARGE OR ITCHING   Items with * indicate a potential emergency and should be followed up as soon as possible or go to the Emergency Department if any problems should occur.  Please show the CHEMOTHERAPY ALERT CARD or IMMUNOTHERAPY ALERT CARD at check-in to the Emergency  Department and triage nurse.  Should you have questions after your visit or need to cancel or reschedule your appointment, please contact [facility] [place] [facility] [phone]  and follow the prompts.  Office hours are 8:00 a.m. to 4:30 p.m. Monday - Friday. Please note that voicemails left after 4:00 p.m. may not be returned until the following business day.  We are closed weekends and major holidays. You have access to a nurse at all times for urgent questions. Please call the main number to the clinic [phone] and follow the prompts.  For any non-urgent questions, you may also contact your provider using MyChart. We now offer e-Visits for anyone 18 and older to request care online for non-urgent symptoms. For details visit [email or web address].   Also download the MyChart app! Go to the app store, search "MyChart", open the app, select [place], and log in with your MyChart username and password.   

## 2022-11-06 LAB — CULTURE, BLOOD (ROUTINE X 2)
Culture: NO GROWTH
Culture: NO GROWTH
Special Requests: ADEQUATE

## 2022-11-07 ENCOUNTER — Ambulatory Visit (HOSPITAL_COMMUNITY)
Admission: RE | Admit: 2022-11-07 | Discharge: 2022-11-07 | Disposition: A | Discharge status: Home / Self Care | Payer: Medicare Other | Source: Ambulatory Visit | Attending: Oncology | Admitting: Oncology

## 2022-11-07 DIAGNOSIS — J90 Pleural effusion, not elsewhere classified: Secondary | ICD-10-CM | POA: Insufficient documentation

## 2022-11-07 DIAGNOSIS — R188 Other ascites: Secondary | ICD-10-CM | POA: Insufficient documentation

## 2022-11-07 DIAGNOSIS — K5730 Diverticulosis of large intestine without perforation or abscess without bleeding: Secondary | ICD-10-CM | POA: Insufficient documentation

## 2022-11-07 DIAGNOSIS — C786 Secondary malignant neoplasm of retroperitoneum and peritoneum: Secondary | ICD-10-CM | POA: Insufficient documentation

## 2022-11-07 DIAGNOSIS — N8110 Cystocele, unspecified: Secondary | ICD-10-CM | POA: Insufficient documentation

## 2022-11-07 DIAGNOSIS — I2510 Atherosclerotic heart disease of native coronary artery without angina pectoris: Secondary | ICD-10-CM | POA: Insufficient documentation

## 2022-11-07 DIAGNOSIS — C569 Malignant neoplasm of unspecified ovary: Secondary | ICD-10-CM | POA: Insufficient documentation

## 2022-11-07 DIAGNOSIS — I700 Atherosclerosis of aorta: Secondary | ICD-10-CM | POA: Insufficient documentation

## 2022-11-07 DIAGNOSIS — K449 Diaphragmatic hernia without obstruction or gangrene: Secondary | ICD-10-CM | POA: Insufficient documentation

## 2022-11-07 MED ORDER — SODIUM CHLORIDE (PF) 0.9 % IJ SOLN
INTRAMUSCULAR | Status: AC
  Filled 2022-11-07: qty 50

## 2022-11-07 MED ORDER — IOHEXOL 300 MG/ML  SOLN
30.0000 mL | Freq: Once | INTRAMUSCULAR | Status: AC | PRN
  Administered 2022-11-07: 30 mL via ORAL

## 2022-11-07 MED ORDER — HEPARIN SOD (PORK) LOCK FLUSH 100 UNIT/ML IV SOLN
500.0000 [IU] | Freq: Once | INTRAVENOUS | Status: AC
  Administered 2022-11-07: 500 [IU] via INTRAVENOUS

## 2022-11-07 MED ORDER — IOHEXOL 300 MG/ML  SOLN
100.0000 mL | Freq: Once | INTRAMUSCULAR | Status: AC | PRN
  Administered 2022-11-07: 100 mL via INTRAVENOUS

## 2022-11-07 MED ORDER — HEPARIN SOD (PORK) LOCK FLUSH 100 UNIT/ML IV SOLN
INTRAVENOUS | Status: AC
  Filled 2022-11-07: qty 5

## 2022-11-08 ENCOUNTER — Other Ambulatory Visit: Payer: Self-pay | Admitting: *Deleted

## 2022-11-08 ENCOUNTER — Telehealth: Payer: Self-pay | Admitting: Gynecologic Oncology

## 2022-11-08 ENCOUNTER — Ambulatory Visit (HOSPITAL_BASED_OUTPATIENT_CLINIC_OR_DEPARTMENT_OTHER): Payer: Medicare Other

## 2022-11-08 MED ORDER — TRAZODONE HCL 50 MG PO TABS
50.0000 mg | ORAL_TABLET | Freq: Every evening | ORAL | 1 refills | Status: DC | PRN
Start: 2022-11-08 — End: 2023-05-13

## 2022-11-08 NOTE — Telephone Encounter (Signed)
Called the patient to discuss recent CT results.  There has been some decrease in her ascites but no significant change in her abdominal pelvic disease.  Pleural effusion has increased some.  She was recently treated for pneumonia, which may have contributed to increased fluid.  Discussed with her my recommendation to proceed with cycle 4.  Will see what her CA125 is.  I am tempted to proceed for at least a diagnostic laparoscopy 4 weeks after her next cycle to see if debulking is feasible.  I will wait and see what her CA125 is next week.  If this continues to drop, will plan to proceed with at least diagnostic surgery, possible tumor debulking in mid November.  If CA125 has not changed significantly or has increased, will call the patient to discuss next steps.  Eugene Garnet MD Gynecologic Oncology

## 2022-11-09 ENCOUNTER — Inpatient Hospital Stay: Payer: Medicare Other | Admitting: Gynecologic Oncology

## 2022-11-09 DIAGNOSIS — C800 Disseminated malignant neoplasm, unspecified: Secondary | ICD-10-CM

## 2022-11-13 NOTE — Progress Notes (Signed)
[facility] [address] [place] [address]    Clinic Day:  11/15/2022  Referring physician: Tower, Audrie Gallus, MD  Patient Care Team: Tower, Audrie Gallus, MD as PCP - General Doreatha Massed, MD as Medical Oncologist (Medical Oncology) Therese Sarah, RN as Oncology Nurse Navigator (Medical Oncology)   ASSESSMENT & PLAN:   Assessment: 1.  Peritoneal carcinomatosis/ovarian cancer: - Presentation: 73-month history of abdominal distention and bloating. - Last colonoscopy (01/01/2020): No evidence of malignancy. - CTAP (08/23/2022): Moderate ascites, probable omental caking in the left lower quadrant, possible small peritoneal implants seen in the pelvis.  Diverticulosis of descending and sigmoid colon with moderate wall thickening of the distal sigmoid colon.  Small bilateral pleural effusions. - 08/26/2022: CA125: 7356, CEA: 1 - Germline mutation testing (09/16/2017): Negative. - Cycle 1 of carboplatin and paclitaxel on 09/12/2022   2. Social/Family History: -She lives at home with husband and is his primary caregiver. She is independent ADLS and iADLS. Retired International aid/development worker. No tobacco use.  -She had skin squamous cell carcinoma. Her brother had colon cancer at 37 and follicular non-Hodgkin's lymphoma and DLBCL, her maternal grandfather died of prostate cancer, maternal first cousin had colon cancer, and maternal aunt died of lung cancer (smoker).     Plan: 1.  Peritoneal carcinomatosis/ovarian cancer: - She has completed 3 cycles of chemotherapy on 10/24/2022. - She was evaluated in the ER on 11/01/2022 with fever.  She reports some dry cough.  She also reported having a presyncopal episode last week. - We reviewed CT CAP from 11/07/2022: Ascites has improved.  Diffuse omental soft tissue caking and thickening and enhancement of the right deep pelvic peritoneum did not significantly change from last CT from 08/22/2022.  Small to moderate posterior left pleural effusion  increased.  No lymphadenopathy or additional metastatic disease noted. - Dr. Pricilla Holm has reached out to me about further plan.  We will proceed with cycle 4 today.  If the CA125 is improving, she will likely have surgery in mid November.  We will hold cycle 5.  If the CA125 is now improving, we will continue with 6 cycles. - RTC 3 weeks for follow-up.   2.  Intermittent nausea: - Continue Compazine 5 mg every 6 hours as needed.   3.  Left gastrocnemius/calf vein distal DVT (Dx 07/24/2022): - Continue Eliquis twice daily.  No bleeding issues.   4.  Peripheral neuropathy: - Tingling in the fingertips is constant and stable.  Occasional tingling in the toes is also stable.  Paclitaxel is already 20% dose reduced.  5. Iron deficiency anemia: -- She received Feraheme on 10/29/2022 and 11/05/2022.  Hemoglobin is 11.5 today.    No orders of the defined types were placed in this encounter.     I,Katie Daubenspeck,acting as a Neurosurgeon for Doreatha Massed, MD.,have documented all relevant documentation on the behalf of Doreatha Massed, MD,as directed by  Doreatha Massed, MD while in the presence of Doreatha Massed, MD.   I, Doreatha Massed MD, have reviewed the above documentation for accuracy and completeness, and I agree with the above.   Doreatha Massed, MD   10/17/20246:11 PM  CHIEF COMPLAINT:   Diagnosis: Peritoneal carcinomatosis/ovarian cancer    Cancer Staging  No matching staging information was found for the patient.    Prior Therapy: none  Current Therapy:  Neoadjuvant carboplatin and paclitaxel    HISTORY OF PRESENT ILLNESS:   Oncology History  Carcinomatosis (HCC)  08/31/2022 Initial Diagnosis  Carcinomatosis (HCC)   09/12/2022 -  Chemotherapy   Patient is on Treatment Plan : OVARIAN Carboplatin (AUC 6) + Paclitaxel (175) q21d X 6 Cycles        INTERVAL HISTORY:   Rachel Carlson is a 77 y.o. female presenting to clinic today for follow up of  Peritoneal carcinomatosis/ovarian cancer. She was last seen by Dr. Anders Simmonds on 10/24/22.  Since her last visit, she underwent restaging CT C/A/P on 11/07/22 showing: decreased ascites, currently small to moderate volume; no substantial change in diffuse omental soft tissue caking; increased posterior left pleural effusion, currently small to moderate; no lymphadenopathy or additional findings of metastatic disease.  Today, she states that she is doing well overall. Her appetite level is at 50%. Her energy level is at 50%.  PAST MEDICAL HISTORY:   Past Medical History: 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)    Esophageal stricture    Family history of breast cancer    Family history of colon cancer    Family history of melanoma    Family history of prostate cancer    Fibroids 1998   uterine, history of (left ovaries)   GERD (gastroesophageal reflux disease)    History of uterine prolapse    Hx of adenomatous polyp of colon 10/19/2014   Hyperlipidemia    Obesity    PVC (premature ventricular contraction)    first dx by holter in 1980's, echo (6/10) EF 55-60%, normal diastolic fxn, normal size RV and fxn, mild MR, PASP   Squamous cell carcinoma of arm    Tubal pregnancy    Ulcerative proctosigmoiditis (HCC)    Urge urinary incontinence 06/03/2007   Qualifier: Diagnosis of  By: Lindwood Qua CMA, Jerl Santos      Surgical History: Past Surgical History:  Procedure Laterality Date   BLADDER NECK SUSPENSION  1998   BREAST BIOPSY Left    BREAST LUMPECTOMY Left    benign   CATARACT EXTRACTION Right 01/2020   CATARACT EXTRACTION W/PHACO Left 08/12/2020   Procedure: CATARACT EXTRACTION PHACO AND INTRAOCULAR LENS PLACEMENT LEFT EYE;  Surgeon: Fabio Pierce, MD;  Location: [facility];  Service: Ophthalmology;  Laterality: Left;  left CDE=10.71   COLONOSCOPY   05/18/2011/10/12/14   Dr. Stan Head   ECTOPIC PREGNANCY SURGERY  1980   ESOPHAGOGASTRODUODENOSCOPY (EGD) WITH ESOPHAGEAL DILATION  04/03/2012   Dr. Stan Head   IR IMAGING GUIDED PORT INSERTION  09/06/2022   PARTIAL HYSTERECTOMY     abdominal, fibroids, and prolapse (1998) bladder tack   SKIN CANCER EXCISION     UPPER GASTROINTESTINAL ENDOSCOPY      Social History: Social History   Socioeconomic History   Marital status: Married    Spouse name: Not on file   Number of children: 3   Years of education: Not on file   Highest education level: Not on file  Occupational History   Occupation: International aid/development worker    Comment: retired 2009    Employer: unemployed  Tobacco Use   Smoking status: Never   Smokeless tobacco: Never  Vaping Use   Vaping status: Never Used  Substance and Sexual Activity   Alcohol use: No    Alcohol/week: 0.0 standard drinks of alcohol    Comment: rarely beer   Drug use: No   Sexual activity: Not Currently  Other Topics Concern   Not  on file  Social History Narrative   Retired International aid/development worker, 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)   Grown children   Never smoker no alcohol tobacco or drug use   Social Determinants of Corporate investment banker Strain: Low Risk  (08/28/2022)   Overall Financial Resource Strain (CARDIA)    Difficulty of Paying Living Expenses: Not hard at all  Food Insecurity: No Food Insecurity (08/28/2022)   Hunger Vital Sign    Worried About Running Out of Food in the Last Year: Never true    Ran Out of Food in the Last Year: Never true  Transportation Needs: No Transportation Needs (08/28/2022)   PRAPARE - Administrator, Civil Service (Medical): No    Lack of Transportation (Non-Medical): No  Physical Activity: Inactive (08/28/2022)   Exercise Vital Sign    Days of Exercise per Week: 0 days    Minutes of Exercise per Session: 0 min  Stress: No Stress Concern Present (08/28/2022)   [employer] of Occupational  Health - Occupational Stress Questionnaire    Feeling of Stress : Not at all  Social Connections: Moderately Integrated (08/28/2022)   Social Connection and Isolation Panel [NHANES]    Frequency of Communication with Friends and Family: More than three times a week    Frequency of Social Gatherings with Friends and Family: More than three times a week    Attends Religious Services: Never    Database administrator or Organizations: Yes    Attends Engineer, structural: More than 4 times per year    Marital Status: Married  Catering manager Violence: Not At Risk (08/28/2022)   Humiliation, Afraid, Rape, and Kick questionnaire    Fear of Current or Ex-Partner: No    Emotionally Abused: No    Physically Abused: No    Sexually Abused: No    Family History: Family History  Problem Relation Age of Onset   Sudden death Father 74       "coronary arteriosclerosis" on death certificate   Heart attack Mother 41   Transient ischemic attack Mother    Diabetes type II Mother    Sudden death Mother        died age 45   Diabetes Mother    Coronary artery disease Mother    Skin cancer Mother    Barrett's esophagus Mother    Arrhythmia Brother    Colon cancer Brother 38   Breast cancer Sister        dx in her 9s   Colon polyps Sister        adenomatous   Colon cancer Son 37       lynch syndrome   Melanoma Maternal Uncle    Congestive Heart Failure Maternal Grandmother    Prostate cancer Maternal Grandfather        dx in 38s   Tuberculosis Paternal Grandfather    Skin cancer Maternal Aunt    Barrett's esophagus Maternal Aunt    Other Son        parotid gland tumor   Ehlers-Danlos syndrome Daughter    Colon cancer Cousin        mid 44s; maternal cousin   Colon cancer Other        MGMs brother   Esophageal cancer Neg Hx    Stomach cancer Neg Hx    Rectal cancer Neg Hx     Current Medications:  Current Outpatient Medications:    apixaban (ELIQUIS) 5 MG  TABS tablet, Take  1 tablet (5 mg total) by mouth 2 (two) times daily., Disp: 60 tablet, Rfl: 2   Ascorbic Acid (VITAMIN C) 1000 MG tablet, Take 1,000 mg by mouth daily., Disp: , Rfl:    calcium citrate-vitamin D (CITRACAL+D) 315-200 MG-UNIT per tablet, Take 1 tablet by mouth 2 (two) times daily., Disp: , Rfl:    CARBOPLATIN IV, Inject into the vein every 21 ( twenty-one) days. port, Disp: , Rfl:    Cholecalciferol (VITAMIN D-3) 5000 UNITS TABS, Take 5,000 Units by mouth daily., Disp: , Rfl:    Coenzyme Q10 (CO Q-10) 200 MG CAPS, Take 200 mg by mouth daily., Disp: , Rfl:    CRANBERRY PO, Take 650 mg by mouth daily., Disp: , Rfl:    doxycycline (VIBRAMYCIN) 100 MG capsule, Take 1 capsule (100 mg total) by mouth 2 (two) times daily., Disp: 14 capsule, Rfl: 0   Glucosamine-Chondroitin (GLUCOSAMINE CHONDR COMPLEX PO), Take 2 capsules by mouth daily. , Disp: , Rfl:    GRAPE SEED EXTRACT PO, Take 1 capsule by mouth daily., Disp: , Rfl:    ketoconazole (NIZORAL) 2 % cream, Apply 1 Application topically daily. To yeast rash, Disp: 30 g, Rfl: 1   lansoprazole (PREVACID) 15 MG capsule, Take 1 capsule (15 mg total) by mouth 2 (two) times daily before a meal. (Patient taking differently: Take 15 mg by mouth daily. Uses over the counter brand), Disp: , Rfl:    LIALDA 1.2 g EC tablet, Take 2 tablets by mouth twice daily, Disp: 120 tablet, Rfl: 2   lidocaine-prilocaine (EMLA) cream, Apply to affected area once, Disp: 30 g, Rfl: 3   milk thistle 175 MG tablet, Take 175 mg by mouth daily., Disp: , Rfl:    Omega-3 Fatty Acids (FISH OIL TRIPLE STRENGTH PO), Take 1 capsule by mouth daily., Disp: , Rfl:    ondansetron (ZOFRAN) 8 MG tablet, Take 1 tablet (8 mg total) by mouth every 8 (eight) hours as needed for nausea or vomiting. Start on the third day after carboplatin., Disp: 30 tablet, Rfl: 1   PACLITAXEL IV, Inject into the vein every 21 ( twenty-one) days. port, Disp: , Rfl:    Polyethyl Glycol-Propyl Glycol (SYSTANE OP), Place 1  drop into both eyes daily., Disp: , Rfl:    rosuvastatin (CRESTOR) 20 MG tablet, Take 1 tablet (20 mg total) by mouth at bedtime., Disp: 90 tablet, Rfl: 3   traZODone (DESYREL) 50 MG tablet, Take 1 tablet (50 mg total) by mouth at bedtime as needed for sleep., Disp: 30 tablet, Rfl: 1   vitamin B-12 (CYANOCOBALAMIN) 1000 MCG tablet, Take 1,000 mcg by mouth daily., Disp: , Rfl:    zinc gluconate 50 MG tablet, Take 50 mg by mouth daily., Disp: , Rfl:    Allergies: Allergies  Allergen Reactions   Pantoprazole Sodium Diarrhea    Abdominal pain   Ranitidine Diarrhea    abdominal pain   Omeprazole Diarrhea    Abdominal pain   Sulfasalazine Rash    Fever, chills, headache, muscle pain    REVIEW OF SYSTEMS:   Review of Systems  Constitutional:  Negative for chills, fatigue and fever.  HENT:   Negative for lump/mass, mouth sores, nosebleeds, sore throat and trouble swallowing.   Eyes:  Negative for eye problems.  Respiratory:  Positive for cough and shortness of breath.   Cardiovascular:  Negative for chest pain, leg swelling and palpitations.  Gastrointestinal:  Positive for nausea and vomiting. Negative for abdominal  pain, constipation and diarrhea.  Genitourinary:  Negative for bladder incontinence, difficulty urinating, dysuria, frequency, hematuria and nocturia.   Musculoskeletal:  Negative for arthralgias, back pain, flank pain, myalgias and neck pain.  Skin:  Negative for itching and rash.  Neurological:  Positive for dizziness and numbness. Negative for headaches.  Hematological:  Does not bruise/bleed easily.  Psychiatric/Behavioral:  Negative for depression, sleep disturbance and suicidal ideas. The patient is not nervous/anxious.   All other systems reviewed and are negative.    VITALS:   Last menstrual period 05/29/1996.  Wt Readings from Last 3 Encounters:  11/14/22 173 lb (78.5 kg)  11/01/22 170 lb (77.1 kg)  10/29/22 170 lb (77.1 kg)    There is no height or weight  on file to calculate BMI.  Performance status (ECOG): 1 - Symptomatic but completely ambulatory  PHYSICAL EXAM:   Physical Exam Vitals and nursing note reviewed. Exam conducted with a chaperone present.  Constitutional:      Appearance: Normal appearance.  Cardiovascular:     Rate and Rhythm: Normal rate and regular rhythm.     Pulses: Normal pulses.     Heart sounds: Normal heart sounds.  Pulmonary:     Effort: Pulmonary effort is normal.     Breath sounds: Normal breath sounds.  Abdominal:     Palpations: Abdomen is soft. There is no hepatomegaly, splenomegaly or mass.     Tenderness: There is no abdominal tenderness.  Musculoskeletal:     Right lower leg: No edema.     Left lower leg: No edema.  Lymphadenopathy:     Cervical: No cervical adenopathy.     Right cervical: No superficial, deep or posterior cervical adenopathy.    Left cervical: No superficial, deep or posterior cervical adenopathy.     Upper Body:     Right upper body: No supraclavicular or axillary adenopathy.     Left upper body: No supraclavicular or axillary adenopathy.  Neurological:     General: No focal deficit present.     Mental Status: She is alert and oriented to person, place, and time.  Psychiatric:        Mood and Affect: Mood normal.        Behavior: Behavior normal.     LABS:      Latest Ref Rng & Units 11/14/2022    8:16 AM 11/01/2022    8:23 PM 10/24/2022    8:01 AM  CBC  WBC 4.0 - 10.5 K/uL 4.2  4.2  5.2   Hemoglobin 12.0 - 15.0 g/dL 91.4  78.2  95.6   Hematocrit 36.0 - 46.0 % 35.3  32.0  33.6   Platelets 150 - 400 K/uL 237  161  237       Latest Ref Rng & Units 11/14/2022    8:16 AM 11/01/2022    8:23 PM 10/24/2022    8:01 AM  CMP  Glucose 70 - 99 mg/dL 99  213  98   BUN 8 - 23 mg/dL 14  19  13    Creatinine 0.44 - 1.00 mg/dL 0.86  5.78  4.69   Sodium 135 - 145 mmol/L 136  134  137   Potassium 3.5 - 5.1 mmol/L 3.8  3.8  3.8   Chloride 98 - 111 mmol/L 103  102  105   CO2 22  - 32 mmol/L 25  23  21    Calcium 8.9 - 10.3 mg/dL 9.1  9.3  8.9   Total Protein 6.5 -  8.1 g/dL 6.6  6.9  6.3   Total Bilirubin 0.3 - 1.2 mg/dL 0.6  0.4  0.5   Alkaline Phos 38 - 126 U/L 54  59  56   AST 15 - 41 U/L 16  17  15    ALT 0 - 44 U/L 14  14  13       Lab Results  Component Value Date   CEA1 1.0 08/26/2022   /  CEA  Date Value Ref Range Status  08/26/2022 1.0 0.0 - 4.7 ng/mL Final    Comment:    (NOTE)                             Nonsmokers          <3.9                             Smokers             <5.6 Roche [employer]ostics Electrochemiluminescence Immunoassay (ECLIA) Values obtained with different assay methods or kits cannot be used interchangeably.  Results cannot be interpreted as absolute evidence of the presence or absence of malignant disease. Performed At: [facility] [address] [place], [place] [id number] Jolene Schimke MD MV:[id number]    No results found for: "PSA1" No results found for: "CAN199" Lab Results  Component Value Date   WUX324 4,552.0 (H) 11/14/2022    No results found for: "TOTALPROTELP", "ALBUMINELP", "A1GS", "A2GS", "BETS", "BETA2SER", "GAMS", "MSPIKE", "SPEI" Lab Results  Component Value Date   TIBC 369 10/24/2022   FERRITIN 22 10/24/2022   IRONPCTSAT 10 (L) 10/24/2022   No results found for: "LDH"   STUDIES:   CT CHEST ABDOMEN PELVIS W CONTRAST  Result Date: 11/08/2022 CLINICAL DATA:  Ovarian cancer, peritoneal carcinomatosis. Restaging. * Tracking Code: BO * EXAM: CT CHEST, ABDOMEN, AND PELVIS WITH CONTRAST TECHNIQUE: Multidetector CT imaging of the chest, abdomen and pelvis was performed following the standard protocol during bolus administration of intravenous contrast. RADIATION DOSE REDUCTION: This exam was performed according to the departmental dose-optimization program which includes automated exposure control, adjustment of the mA and/or kV according to patient size and/or use of iterative reconstruction  technique. CONTRAST:  OMNIPAQUE IOHEXOL 300 MG/ML  SOLN COMPARISON:  08/30/2022 chest CT.  08/23/2022 CT abdomen/pelvis. FINDINGS: CT CHEST FINDINGS Cardiovascular: Normal heart size. No significant pericardial effusion/thickening. Three-vessel coronary atherosclerosis. Right internal jugular Port-A-Cath terminates the cavoatrial junction. Atherosclerotic nonaneurysmal thoracic aorta. Normal caliber pulmonary arteries. No central pulmonary emboli. Mediastinum/Nodes: No significant thyroid nodules. Unremarkable esophagus. No pathologically enlarged axillary, mediastinal or hilar lymph nodes. Coarsely calcified nonenlarged right hilar nodes are unchanged and compatible with prior granulomatous disease. Lungs/Pleura: No pneumothorax. Trace posterior right pleural effusion is stable. Small to moderate posterior left pleural effusion is increased. Moderate left and mild right basilar atelectasis, similar. No acute consolidative airspace disease, lung masses or significant pulmonary nodules. Stable subcentimeter calcified medial right lower lobe granuloma. Musculoskeletal: No aggressive appearing focal osseous lesions. Moderate thoracic spondylosis. CT ABDOMEN PELVIS FINDINGS Hepatobiliary: Normal liver with no liver mass. Normal gallbladder with no radiopaque cholelithiasis. No biliary ductal dilatation. Pancreas: Normal, with no mass or duct dilation. Spleen: Normal size. No mass. Adrenals/Urinary Tract: Normal adrenals. Normal kidneys with no hydronephrosis and no renal mass. Small cystocele. Otherwise normal bladder. Stomach/Bowel: Moderate hiatal hernia. Stomach is nondistended and otherwise normal. Normal caliber small bowel with no small  bowel wall thickening. Normal appendix. Oral contrast transits to the transverse colon. Marked left colonic diverticulosis with no large bowel wall thickening. Vascular/Lymphatic: Atherosclerotic nonaneurysmal abdominal aorta. Patent portal, splenic, hepatic and renal veins.  No pathologically enlarged lymph nodes in the abdomen or pelvis. Reproductive: Status post hysterectomy, with no abnormal findings at the vaginal cuff. Thin-walled 1.4 cm right adnexal cyst (series 2/image 90), stable. No left adnexal mass. Other: Small to moderate volume ascites, decreased. No pneumoperitoneum. Thickening and enhancement of the right deep pelvic peritoneum up to 7 mm thickness (series 2/image 102), previously 7 mm, stable. Diffuse omental soft tissue caking is not substantially changed. Musculoskeletal: No aggressive appearing focal osseous lesions. Mild lumbar spondylosis. IMPRESSION: 1. Small to moderate volume ascites, decreased. 2. Diffuse omental soft tissue caking and thickening and enhancement of the right deep pelvic peritoneum, compatible with peritoneal carcinomatosis, not substantially changed from 08/23/2022 CT. 3. Small to moderate posterior left pleural effusion, increased. Trace posterior right pleural effusion is stable. 4. No lymphadenopathy or additional findings of metastatic disease in the chest, abdomen or pelvis. 5. Chronic findings include: Three-vessel coronary atherosclerosis. Moderate hiatal hernia. Marked left colonic diverticulosis. Small cystocele. Aortic Atherosclerosis (ICD10-I70.0). Electronically Signed   By: Delbert Phenix M.D.   On: 11/08/2022 09:26   DG Chest Portable 1 View  Result Date: 11/01/2022 CLINICAL DATA:  Fever cough EXAM: PORTABLE CHEST 1 VIEW COMPARISON:  CT 08/30/2022 FINDINGS: Right-sided central venous port tip over the SVC. Small right and small moderate left pleural effusion with possible loculation on the left. Airspace disease at left base. Hiatal hernia. Stable cardiomediastinal silhouette. No pneumothorax IMPRESSION: Small right and small to moderate left pleural effusion with possible loculation on the left. Airspace disease at left base may reflect atelectasis or pneumonia. Electronically Signed   By: Jasmine Pang M.D.   On: 11/01/2022  20:52

## 2022-11-14 ENCOUNTER — Inpatient Hospital Stay: Payer: Medicare Other

## 2022-11-14 ENCOUNTER — Inpatient Hospital Stay: Payer: Medicare Other | Admitting: Hematology

## 2022-11-14 VITALS — BP 140/69 | HR 91 | Temp 97.0°F | Resp 18 | Ht 61.5 in | Wt 173.0 lb

## 2022-11-14 VITALS — BP 141/69 | HR 92 | Temp 98.9°F | Resp 18

## 2022-11-14 DIAGNOSIS — C800 Disseminated malignant neoplasm, unspecified: Secondary | ICD-10-CM | POA: Diagnosis not present

## 2022-11-14 DIAGNOSIS — Z7901 Long term (current) use of anticoagulants: Secondary | ICD-10-CM | POA: Diagnosis not present

## 2022-11-14 DIAGNOSIS — D509 Iron deficiency anemia, unspecified: Secondary | ICD-10-CM | POA: Diagnosis not present

## 2022-11-14 DIAGNOSIS — I82409 Acute embolism and thrombosis of unspecified deep veins of unspecified lower extremity: Secondary | ICD-10-CM | POA: Diagnosis not present

## 2022-11-14 DIAGNOSIS — R059 Cough, unspecified: Secondary | ICD-10-CM | POA: Diagnosis not present

## 2022-11-14 DIAGNOSIS — G620 Drug-induced polyneuropathy: Secondary | ICD-10-CM | POA: Diagnosis not present

## 2022-11-14 DIAGNOSIS — R971 Elevated cancer antigen 125 [CA 125]: Secondary | ICD-10-CM | POA: Diagnosis not present

## 2022-11-14 DIAGNOSIS — R5383 Other fatigue: Secondary | ICD-10-CM | POA: Diagnosis not present

## 2022-11-14 DIAGNOSIS — T451X5D Adverse effect of antineoplastic and immunosuppressive drugs, subsequent encounter: Secondary | ICD-10-CM | POA: Diagnosis not present

## 2022-11-14 DIAGNOSIS — J90 Pleural effusion, not elsewhere classified: Secondary | ICD-10-CM | POA: Diagnosis not present

## 2022-11-14 DIAGNOSIS — Z79899 Other long term (current) drug therapy: Secondary | ICD-10-CM | POA: Diagnosis not present

## 2022-11-14 DIAGNOSIS — Z808 Family history of malignant neoplasm of other organs or systems: Secondary | ICD-10-CM | POA: Diagnosis not present

## 2022-11-14 DIAGNOSIS — K660 Peritoneal adhesions (postprocedural) (postinfection): Secondary | ICD-10-CM

## 2022-11-14 DIAGNOSIS — Z95828 Presence of other vascular implants and grafts: Secondary | ICD-10-CM

## 2022-11-14 DIAGNOSIS — Z800 Family history of malignant neoplasm of digestive organs: Secondary | ICD-10-CM | POA: Diagnosis not present

## 2022-11-14 DIAGNOSIS — R110 Nausea: Secondary | ICD-10-CM | POA: Diagnosis not present

## 2022-11-14 DIAGNOSIS — Z5111 Encounter for antineoplastic chemotherapy: Secondary | ICD-10-CM | POA: Diagnosis not present

## 2022-11-14 LAB — COMPREHENSIVE METABOLIC PANEL
ALT: 14 U/L (ref 0–44)
AST: 16 U/L (ref 15–41)
Albumin: 3.7 g/dL (ref 3.5–5.0)
Alkaline Phosphatase: 54 U/L (ref 38–126)
Anion gap: 8 (ref 5–15)
BUN: 14 mg/dL (ref 8–23)
CO2: 25 mmol/L (ref 22–32)
Calcium: 9.1 mg/dL (ref 8.9–10.3)
Chloride: 103 mmol/L (ref 98–111)
Creatinine, Ser: 0.55 mg/dL (ref 0.44–1.00)
GFR, Estimated: >60 mL/min (ref >60)
Glucose, Bld: 99 mg/dL (ref 70–99)
Potassium: 3.8 mmol/L (ref 3.5–5.1)
Sodium: 136 mmol/L (ref 135–145)
Total Bilirubin: 0.6 mg/dL (ref 0.3–1.2)
Total Protein: 6.6 g/dL (ref 6.5–8.1)

## 2022-11-14 LAB — CBC WITH DIFFERENTIAL/PLATELET
Abs Immature Granulocytes: 0.01 10*3/uL (ref 0.00–0.07)
Basophils Absolute: 0 10*3/uL (ref 0.0–0.1)
Basophils Relative: 1 %
Eosinophils Absolute: 0 10*3/uL (ref 0.0–0.5)
Eosinophils Relative: 0 %
HCT: 35.3 % — ABNORMAL LOW (ref 36.0–46.0)
Hemoglobin: 11.5 g/dL — ABNORMAL LOW (ref 12.0–15.0)
Immature Granulocytes: 0 %
Lymphocytes Relative: 20 %
Lymphs Abs: 0.9 10*3/uL (ref 0.7–4.0)
MCH: 29.3 pg (ref 26.0–34.0)
MCHC: 32.6 g/dL (ref 30.0–36.0)
MCV: 89.8 fL (ref 80.0–100.0)
Monocytes Absolute: 0.5 10*3/uL (ref 0.1–1.0)
Monocytes Relative: 11 %
Neutro Abs: 2.8 10*3/uL (ref 1.7–7.7)
Neutrophils Relative %: 68 %
Platelets: 237 10*3/uL (ref 150–400)
RBC: 3.93 MIL/uL (ref 3.87–5.11)
RDW: 19.8 % — ABNORMAL HIGH (ref 11.5–15.5)
WBC: 4.2 10*3/uL (ref 4.0–10.5)
nRBC: 0 % (ref 0.0–0.2)

## 2022-11-14 LAB — MAGNESIUM: Magnesium: 2.1 mg/dL (ref 1.7–2.4)

## 2022-11-14 MED ORDER — SODIUM CHLORIDE 0.9% FLUSH
10.0000 mL | INTRAVENOUS | Status: DC | PRN
  Administered 2022-11-14: 10 mL via INTRAVENOUS

## 2022-11-14 MED ORDER — SODIUM CHLORIDE 0.9 % IV SOLN
10.0000 mg | Freq: Once | INTRAVENOUS | Status: AC
  Administered 2022-11-14: 10 mg via INTRAVENOUS
  Filled 2022-11-14: qty 10

## 2022-11-14 MED ORDER — FAMOTIDINE IN NACL 20-0.9 MG/50ML-% IV SOLN
20.0000 mg | Freq: Once | INTRAVENOUS | Status: AC
  Administered 2022-11-14: 20 mg via INTRAVENOUS
  Filled 2022-11-14: qty 50

## 2022-11-14 MED ORDER — SODIUM CHLORIDE 0.9 % IV SOLN: Freq: Once | INTRAVENOUS | Status: AC

## 2022-11-14 MED ORDER — SODIUM CHLORIDE 0.9 % IV SOLN
545.4000 mg | Freq: Once | INTRAVENOUS | Status: AC
  Administered 2022-11-14: 550 mg via INTRAVENOUS
  Filled 2022-11-14: qty 55

## 2022-11-14 MED ORDER — HEPARIN SOD (PORK) LOCK FLUSH 100 UNIT/ML IV SOLN
500.0000 [IU] | Freq: Once | INTRAVENOUS | Status: AC | PRN
  Administered 2022-11-14: 500 [IU]

## 2022-11-14 MED ORDER — SODIUM CHLORIDE 0.9% FLUSH
10.0000 mL | INTRAVENOUS | Status: DC | PRN
  Administered 2022-11-14: 10 mL

## 2022-11-14 MED ORDER — PALONOSETRON HCL INJECTION 0.25 MG/5ML
0.2500 mg | Freq: Once | INTRAVENOUS | Status: AC
  Administered 2022-11-14: 0.25 mg via INTRAVENOUS
  Filled 2022-11-14: qty 5

## 2022-11-14 MED ORDER — CETIRIZINE HCL 10 MG/ML IV SOLN
10.0000 mg | Freq: Once | INTRAVENOUS | Status: AC
  Administered 2022-11-14: 10 mg via INTRAVENOUS
  Filled 2022-11-14: qty 1

## 2022-11-14 MED ORDER — SODIUM CHLORIDE 0.9 % IV SOLN
150.0000 mg | Freq: Once | INTRAVENOUS | Status: AC
  Administered 2022-11-14: 150 mg via INTRAVENOUS
  Filled 2022-11-14: qty 150

## 2022-11-14 MED ORDER — SODIUM CHLORIDE 0.9 % IV SOLN
140.0000 mg/m2 | Freq: Once | INTRAVENOUS | Status: AC
  Administered 2022-11-14: 270 mg via INTRAVENOUS
  Filled 2022-11-14: qty 45

## 2022-11-14 NOTE — Progress Notes (Signed)
Patient presents today for Taxol, Carboplatin and follow up visit with Dr. Ellin Saba. Vital signs and labs within parameters for treatment.  Treatment given today per MD orders. Tolerated infusion without adverse affects. Vital signs stable. No complaints at this time. Discharged from clinic ambulatory in stable condition. Alert and oriented x 3. F/U with [facility] as scheduled.

## 2022-11-14 NOTE — Patient Instructions (Addendum)
[facility]  Discharge Instructions  You were seen and examined today by Dr. Ellin Saba.  Dr. Ellin Saba discussed your most recent lab work, which is stable. You may proceed with treatment today as planned. Your CT scan revealed that the cancer has improved.  Your CA-125 for today is still pending. That is the tumor marker we track for ovarian cancer. If the CA-125 is decreasing, we will hold your 5th cycle of treatment and you will proceed with surgery.  Follow-up as scheduled.  Thank you for choosing [facility] to provide your oncology and hematology care.   To afford each patient quality time with our provider, please arrive at least 15 minutes before your scheduled appointment time. You may need to reschedule your appointment if you arrive late (10 or more minutes). Arriving late affects you and other patients whose appointments are after yours.  Also, if you miss three or more appointments without notifying the office, you may be dismissed from the clinic at the provider's discretion.    Again, thank you for choosing [facility].  Our hope is that these requests will decrease the amount of time that you wait before being seen by our physicians.   If you have a lab appointment with the Cancer Center - please note that after April 8th, all labs will be drawn in the cancer center.  You do not have to check in or register with the main entrance as you have in the past but will complete your check-in at the cancer center.            _____________________________________________________________  Should you have questions after your visit to [facility], please contact our office at [phone] and follow the prompts.  Our office hours are 8:00 a.m. to 4:30 p.m. Monday - Thursday and 8:00 a.m. to 2:30 p.m. Friday.  Please note that voicemails left after 4:00 p.m. may not be returned until the following business  day.  We are closed weekends and all major holidays.  You do have access to a nurse 24-7, just call the main number to the clinic [phone] and do not press any options, hold on the line and a nurse will answer the phone.    For prescription refill requests, have your pharmacy contact our office and allow 72 hours.    Masks are no longer required in the cancer centers. If you would like for your care team to wear a mask while they are taking care of you, please let them know. You may have one support person who is at least 75 years old accompany you for your appointments.

## 2022-11-14 NOTE — Progress Notes (Unsigned)
Patient has been assessed, vital signs and labs have been reviewed by Dr. Katragadda. ANC, Creatinine, LFTs, and Platelets are within treatment parameters per Dr. Katragadda. The patient is good to proceed with treatment at this time. Primary RN and pharmacy aware.  

## 2022-11-14 NOTE — Patient Instructions (Signed)
[facility]  Discharge Instructions: Thank you for choosing [facility] to provide your oncology and hematology care.  If you have a lab appointment with the Cancer Center - please note that after April 8th, 2024, all labs will be drawn in the cancer center.  You do not have to check in or register with the main entrance as you have in the past but will complete your check-in in the cancer center.  Wear comfortable clothing and clothing appropriate for easy access to any Portacath or PICC line.   We strive to give you quality time with your provider. You may need to reschedule your appointment if you arrive late (15 or more minutes).  Arriving late affects you and other patients whose appointments are after yours.  Also, if you miss three or more appointments without notifying the office, you may be dismissed from the clinic at the provider's discretion.      For prescription refill requests, have your pharmacy contact our office and allow 72 hours for refills to be completed.    Today you received the following chemotherapy and/or immunotherapy agents Taxol, Carboplatin. Carboplatin Injection What is this medication? CARBOPLATIN (KAR boe pla tin) treats some types of cancer. It works by slowing down the growth of cancer cells. This medicine may be used for other purposes; ask your health care provider or pharmacist if you have questions. COMMON BRAND NAME(S): Paraplatin What should I tell my care team before I take this medication? They need to know if you have any of these conditions: Blood disorders Hearing problems Kidney disease Recent or ongoing radiation therapy An unusual or allergic reaction to carboplatin, cisplatin, other medications, foods, dyes, or preservatives Pregnant or trying to get pregnant Breast-feeding How should I use this medication? This medication is injected into a vein. It is given by your care team in a hospital or clinic  setting. Talk to your care team about the use of this medication in children. Special care may be needed. Overdosage: If you think you have taken too much of this medicine contact a poison control center or emergency room at once. NOTE: This medicine is only for you. Do not share this medicine with others. What if I miss a dose? Keep appointments for follow-up doses. It is important not to miss your dose. Call your care team if you are unable to keep an appointment. What may interact with this medication? Medications for seizures Some antibiotics, such as amikacin, gentamicin, neomycin, streptomycin, tobramycin Vaccines This list may not describe all possible interactions. Give your health care provider a list of all the medicines, herbs, non-prescription drugs, or dietary supplements you use. Also tell them if you smoke, drink alcohol, or use illegal drugs. Some items may interact with your medicine. What should I watch for while using this medication? Your condition will be monitored carefully while you are receiving this medication. You may need blood work while taking this medication. This medication may make you feel generally unwell. This is not uncommon, as chemotherapy can affect healthy cells as well as cancer cells. Report any side effects. Continue your course of treatment even though you feel ill unless your care team tells you to stop. In some cases, you may be given additional medications to help with side effects. Follow all directions for their use. This medication may increase your risk of getting an infection. Call your care team for advice if you get a fever, chills, sore throat, or other  symptoms of a cold or flu. Do not treat yourself. Try to avoid being around people who are sick. Avoid taking medications that contain aspirin, acetaminophen, ibuprofen, naproxen, or ketoprofen unless instructed by your care team. These medications may hide a fever. Be careful brushing or  flossing your teeth or using a toothpick because you may get an infection or bleed more easily. If you have any dental work done, tell your dentist you are receiving this medication. Talk to your care team if you wish to become pregnant or think you might be pregnant. This medication can cause serious birth defects. Talk to your care team about effective forms of contraception. Do not breast-feed while taking this medication. What side effects may I notice from receiving this medication? Side effects that you should report to your care team as soon as possible: Allergic reactions--skin rash, itching, hives, swelling of the face, lips, tongue, or throat Infection--fever, chills, cough, sore throat, wounds that don't heal, pain or trouble when passing urine, general feeling of discomfort or being unwell Low red blood cell level--unusual weakness or fatigue, dizziness, headache, trouble breathing Pain, tingling, or numbness in the hands or feet, muscle weakness, change in vision, confusion or trouble speaking, loss of balance or coordination, trouble walking, seizures Unusual bruising or bleeding Side effects that usually do not require medical attention (report to your care team if they continue or are bothersome): Hair loss Nausea Unusual weakness or fatigue Vomiting This list may not describe all possible side effects. Call your doctor for medical advice about side effects. You may report side effects to FDA at 1-800-FDA-1088. Where should I keep my medication? This medication is given in a hospital or clinic. It will not be stored at home. NOTE: This sheet is a summary. It may not cover all possible information. If you have questions about this medicine, talk to your doctor, pharmacist, or health care provider.  2024 Elsevier/Gold Standard (2021-05-09 00:00:00) Paclitaxel Injection What is this medication? PACLITAXEL (PAK li TAX el) treats some types of cancer. It works by slowing down the  growth of cancer cells. This medicine may be used for other purposes; ask your health care provider or pharmacist if you have questions. COMMON BRAND NAME(S): Onxol, Taxol What should I tell my care team before I take this medication? They need to know if you have any of these conditions: Heart disease Liver disease Low white blood cell levels An unusual or allergic reaction to paclitaxel, other medications, foods, dyes, or preservatives If you or your partner are pregnant or trying to get pregnant Breast-feeding How should I use this medication? This medication is injected into a vein. It is given by your care team in a hospital or clinic setting. Talk to your care team about the use of this medication in children. While it may be given to children for selected conditions, precautions do apply. Overdosage: If you think you have taken too much of this medicine contact a poison control center or emergency room at once. NOTE: This medicine is only for you. Do not share this medicine with others. What if I miss a dose? Keep appointments for follow-up doses. It is important not to miss your dose. Call your care team if you are unable to keep an appointment. What may interact with this medication? Do not take this medication with any of the following: Live virus vaccines Other medications may affect the way this medication works. Talk with your care team about all of the medications you  take. They may suggest changes to your treatment plan to lower the risk of side effects and to make sure your medications work as intended. This list may not describe all possible interactions. Give your health care provider a list of all the medicines, herbs, non-prescription drugs, or dietary supplements you use. Also tell them if you smoke, drink alcohol, or use illegal drugs. Some items may interact with your medicine. What should I watch for while using this medication? Your condition will be monitored  carefully while you are receiving this medication. You may need blood work while taking this medication. This medication may make you feel generally unwell. This is not uncommon as chemotherapy can affect healthy cells as well as cancer cells. Report any side effects. Continue your course of treatment even though you feel ill unless your care team tells you to stop. This medication can cause serious allergic reactions. To reduce the risk, your care team may give you other medications to take before receiving this one. Be sure to follow the directions from your care team. This medication may increase your risk of getting an infection. Call your care team for advice if you get a fever, chills, sore throat, or other symptoms of a cold or flu. Do not treat yourself. Try to avoid being around people who are sick. This medication may increase your risk to bruise or bleed. Call your care team if you notice any unusual bleeding. Be careful brushing or flossing your teeth or using a toothpick because you may get an infection or bleed more easily. If you have any dental work done, tell your dentist you are receiving this medication. Talk to your care team if you may be pregnant. Serious birth defects can occur if you take this medication during pregnancy. Talk to your care team before breastfeeding. Changes to your treatment plan may be needed. What side effects may I notice from receiving this medication? Side effects that you should report to your care team as soon as possible: Allergic reactions--skin rash, itching, hives, swelling of the face, lips, tongue, or throat Heart rhythm changes--fast or irregular heartbeat, dizziness, feeling faint or lightheaded, chest pain, trouble breathing Increase in blood pressure Infection--fever, chills, cough, sore throat, wounds that don't heal, pain or trouble when passing urine, general feeling of discomfort or being unwell Low blood pressure--dizziness, feeling faint  or lightheaded, blurry vision Low red blood cell level--unusual weakness or fatigue, dizziness, headache, trouble breathing Painful swelling, warmth, or redness of the skin, blisters or sores at the infusion site Pain, tingling, or numbness in the hands or feet Slow heartbeat--dizziness, feeling faint or lightheaded, confusion, trouble breathing, unusual weakness or fatigue Unusual bruising or bleeding Side effects that usually do not require medical attention (report to your care team if they continue or are bothersome): Diarrhea Hair loss Joint pain Loss of appetite Muscle pain Nausea Vomiting This list may not describe all possible side effects. Call your doctor for medical advice about side effects. You may report side effects to FDA at 1-800-FDA-1088. Where should I keep my medication? This medication is given in a hospital or clinic. It will not be stored at home. NOTE: This sheet is a summary. It may not cover all possible information. If you have questions about this medicine, talk to your doctor, pharmacist, or health care provider.  2024 Elsevier/Gold Standard (2021-06-06 00:00:00)       To help prevent nausea and vomiting after your treatment, we encourage you to take your nausea medication  as directed.  BELOW ARE SYMPTOMS THAT SHOULD BE REPORTED IMMEDIATELY: *FEVER GREATER THAN 100.4 F (38 C) OR HIGHER *CHILLS OR SWEATING *NAUSEA AND VOMITING THAT IS NOT CONTROLLED WITH YOUR NAUSEA MEDICATION *UNUSUAL SHORTNESS OF BREATH *UNUSUAL BRUISING OR BLEEDING *URINARY PROBLEMS (pain or burning when urinating, or frequent urination) *BOWEL PROBLEMS (unusual diarrhea, constipation, pain near the anus) TENDERNESS IN MOUTH AND THROAT WITH OR WITHOUT PRESENCE OF ULCERS (sore throat, sores in mouth, or a toothache) UNUSUAL RASH, SWELLING OR PAIN  UNUSUAL VAGINAL DISCHARGE OR ITCHING   Items with * indicate a potential emergency and should be followed up as soon as possible or go to  the Emergency Department if any problems should occur.  Please show the CHEMOTHERAPY ALERT CARD or IMMUNOTHERAPY ALERT CARD at check-in to the Emergency Department and triage nurse.  Should you have questions after your visit or need to cancel or reschedule your appointment, please contact [facility] [phone]  and follow the prompts.  Office hours are 8:00 a.m. to 4:30 p.m. Monday - Friday. Please note that voicemails left after 4:00 p.m. may not be returned until the following business day.  We are closed weekends and major holidays. You have access to a nurse at all times for urgent questions. Please call the main number to the clinic [phone] and follow the prompts.  For any non-urgent questions, you may also contact your provider using MyChart. We now offer e-Visits for anyone 73 and older to request care online for non-urgent symptoms. For details visit [email or web address].   Also download the MyChart app! Go to the app store, search "MyChart", open the app, select [place], and log in with your MyChart username and password.

## 2022-11-14 NOTE — Progress Notes (Signed)
Patients port flushed without difficulty.  Good blood return noted with no bruising or swelling noted at site.  Patient remains accessed for treatment.  

## 2022-11-15 ENCOUNTER — Inpatient Hospital Stay: Payer: Medicare Other | Admitting: Dietician

## 2022-11-15 ENCOUNTER — Encounter: Payer: Self-pay | Admitting: Hematology

## 2022-11-15 LAB — CA 125: Cancer Antigen (CA) 125: 4552 U/mL — ABNORMAL HIGH (ref 0.0–38.1)

## 2022-11-16 ENCOUNTER — Telehealth: Payer: Self-pay | Admitting: Gynecologic Oncology

## 2022-11-16 NOTE — Telephone Encounter (Signed)
Called the patient.  Reviewed recent Ca1 25 which decreased but only modestly.  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  I still lean towards diagnostic laparoscopy to see if debulking is possible given extent of disease.  Patient amenable.  Will set her up for debulking surgery in mid November with a chest x-ray in early November as I suspect she will need thoracentesis preop.  Eugene Garnet MD Gynecologic Oncology

## 2022-11-16 NOTE — Progress Notes (Signed)
Message received from Doreatha Massed, MD- Jaime Grizzell, please call her with CA125 results. They have improved to 4552.   Patient aware. She requested that a copy of results be mailed to her. Confirmed her address and letter placed up front to be sent.

## 2022-11-22 ENCOUNTER — Telehealth: Payer: Self-pay | Admitting: Oncology

## 2022-11-22 NOTE — Telephone Encounter (Signed)
Rachel Carlson called and asked about appointments for surgery and to see Dr. Pricilla Holm.  She also asked about a chest x-ray.  Advised her that surgery is tentatively scheduled for 12/12/22 and that we are working on an appointment with Dr. Pricilla Holm on 11/30/22.  Advised I will call her back with that appointment and when she will need to get the chest x ray.

## 2022-11-22 NOTE — Telephone Encounter (Signed)
Called Rachel Carlson back and scheduled an appointment with Dr. Pricilla Holm tomorrow at 12:45.  Also let her know the chest x ray will need to be done 2 weeks before surgery and we can talk about timing at her appointment tomorrow.  She verbalized understanding and agreement.

## 2022-11-23 ENCOUNTER — Encounter: Payer: Self-pay | Admitting: Gynecologic Oncology

## 2022-11-23 ENCOUNTER — Inpatient Hospital Stay (HOSPITAL_BASED_OUTPATIENT_CLINIC_OR_DEPARTMENT_OTHER): Payer: Medicare Other | Admitting: Gynecologic Oncology

## 2022-11-23 ENCOUNTER — Other Ambulatory Visit: Payer: Self-pay

## 2022-11-23 VITALS — BP 137/77 | HR 97 | Temp 98.4°F | Resp 20 | Wt 171.0 lb

## 2022-11-23 DIAGNOSIS — Z5111 Encounter for antineoplastic chemotherapy: Secondary | ICD-10-CM | POA: Diagnosis not present

## 2022-11-23 DIAGNOSIS — R180 Malignant ascites: Secondary | ICD-10-CM

## 2022-11-23 DIAGNOSIS — R059 Cough, unspecified: Secondary | ICD-10-CM | POA: Diagnosis not present

## 2022-11-23 DIAGNOSIS — Z800 Family history of malignant neoplasm of digestive organs: Secondary | ICD-10-CM | POA: Diagnosis not present

## 2022-11-23 DIAGNOSIS — I82409 Acute embolism and thrombosis of unspecified deep veins of unspecified lower extremity: Secondary | ICD-10-CM | POA: Diagnosis not present

## 2022-11-23 DIAGNOSIS — R971 Elevated cancer antigen 125 [CA 125]: Secondary | ICD-10-CM | POA: Diagnosis not present

## 2022-11-23 DIAGNOSIS — J90 Pleural effusion, not elsewhere classified: Secondary | ICD-10-CM

## 2022-11-23 DIAGNOSIS — G620 Drug-induced polyneuropathy: Secondary | ICD-10-CM | POA: Diagnosis not present

## 2022-11-23 DIAGNOSIS — R110 Nausea: Secondary | ICD-10-CM | POA: Diagnosis not present

## 2022-11-23 DIAGNOSIS — Z808 Family history of malignant neoplasm of other organs or systems: Secondary | ICD-10-CM | POA: Diagnosis not present

## 2022-11-23 DIAGNOSIS — R5383 Other fatigue: Secondary | ICD-10-CM | POA: Diagnosis not present

## 2022-11-23 DIAGNOSIS — C579 Malignant neoplasm of female genital organ, unspecified: Secondary | ICD-10-CM | POA: Diagnosis not present

## 2022-11-23 DIAGNOSIS — D509 Iron deficiency anemia, unspecified: Secondary | ICD-10-CM | POA: Diagnosis not present

## 2022-11-23 DIAGNOSIS — Z7901 Long term (current) use of anticoagulants: Secondary | ICD-10-CM | POA: Diagnosis not present

## 2022-11-23 DIAGNOSIS — Z79899 Other long term (current) drug therapy: Secondary | ICD-10-CM | POA: Diagnosis not present

## 2022-11-23 DIAGNOSIS — I82402 Acute embolism and thrombosis of unspecified deep veins of left lower extremity: Secondary | ICD-10-CM

## 2022-11-23 DIAGNOSIS — T451X5D Adverse effect of antineoplastic and immunosuppressive drugs, subsequent encounter: Secondary | ICD-10-CM | POA: Diagnosis not present

## 2022-11-23 DIAGNOSIS — C800 Disseminated malignant neoplasm, unspecified: Secondary | ICD-10-CM

## 2022-11-23 MED ORDER — TRAMADOL HCL 50 MG PO TABS: 50.0000 mg | ORAL_TABLET | Freq: Four times a day (QID) | ORAL | 0 refills | Status: DC | PRN

## 2022-11-23 MED ORDER — BISACODYL 5 MG PO TBEC
DELAYED_RELEASE_TABLET | ORAL | 0 refills | Status: DC
Start: 2022-12-11 — End: 2022-12-21

## 2022-11-23 MED ORDER — POLYETHYLENE GLYCOL 3350 17 GM/SCOOP PO POWD
ORAL | 0 refills | Status: DC
Start: 2022-12-11 — End: 2022-12-21

## 2022-11-23 MED ORDER — METRONIDAZOLE 500 MG PO TABS
ORAL_TABLET | ORAL | 0 refills | Status: DC
Start: 2022-12-11 — End: 2022-12-21

## 2022-11-23 NOTE — Progress Notes (Signed)
Gynecologic Oncology Return Clinic Visit  11/23/22  Reason for Visit: treatment planning  Treatment History: Oncology History  Carcinomatosis (HCC)  08/31/2022 Initial Diagnosis   Carcinomatosis (HCC)   09/12/2022 -  Chemotherapy   Patient is on Treatment Plan : OVARIAN Carboplatin (AUC 6) + Paclitaxel (175) q21d X 6 Cycles      The patient initially presented to the emergency department on 7/8 with a couple months of worsening abdominal distention.  She had previously had outpatient ultrasound which showed mild ascites, further workup was negative for cirrhosis (elastography).  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  Diverticulosis of the descending colon and sigmoid colon noted with moderate wall thickening of the distal sigmoid colon, possibly sequela of prior diverticulitis versus acute inflammation or underlying malignancy.  Small bilateral pleural effusions with adjacent subsegmental atelectasis noted.   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  She underwent colonoscopy on 09/07/2022 with apparent extraluminal compression in the distal sigmoid colon, approximately 20 cm from the anal verge.  Mucosa in his area was edematous and biopsied.  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  CA-125 on 7/28 was 7,356. CEA was normal (1).  C4 carboplatin/taxol on 11/14/22.  Interval History: Patient reports overall doing okay.  She continues to struggle with fatigue as well as decreased appetite.  Has mild occasional nausea.  Endorses daily bowel function now, not needing any medication.  Has some shortness of breath with activity, is able to catch her breath pretty quickly.  Coughs more at night.  Past Medical/Surgical  History: 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)    Esophageal stricture    Family history of breast cancer    Family history of colon cancer    Family history of melanoma    Family history of prostate cancer    Fibroids 1998   uterine, history of (left ovaries)   GERD (gastroesophageal reflux disease)    History of uterine prolapse    Hx of adenomatous polyp of colon 10/19/2014   Hyperlipidemia    Obesity    PVC (premature ventricular contraction)    first dx by holter in 1980's, echo (6/10) EF 55-60%, normal diastolic fxn, normal size RV and fxn, mild MR, PASP   Squamous cell carcinoma of arm    Tubal pregnancy    Ulcerative proctosigmoiditis (HCC)    Urge urinary incontinence 06/03/2007   Qualifier: Diagnosis of  By: Lindwood Qua CMA, Jerl Santos      Past Surgical History:  Procedure Laterality Date   BLADDER NECK SUSPENSION  1998   BREAST BIOPSY Left    BREAST LUMPECTOMY Left    benign   CATARACT EXTRACTION Right 01/2020   CATARACT EXTRACTION W/PHACO Left 08/12/2020   Procedure: CATARACT EXTRACTION PHACO AND INTRAOCULAR LENS PLACEMENT LEFT EYE;  Surgeon: Fabio Pierce, MD;  Location: [facility];  Service: Ophthalmology;  Laterality: Left;  left CDE=10.71   COLONOSCOPY  05/18/2011/10/12/14   Dr. Stan Head   ECTOPIC PREGNANCY SURGERY  1980   ESOPHAGOGASTRODUODENOSCOPY (EGD) WITH ESOPHAGEAL DILATION  04/03/2012   Dr. Stan Head   IR IMAGING GUIDED PORT INSERTION  09/06/2022  PARTIAL HYSTERECTOMY     abdominal, fibroids, and prolapse (1998) bladder tack   SKIN CANCER EXCISION     UPPER GASTROINTESTINAL ENDOSCOPY      Family History  Problem Relation Age of Onset   Sudden death Father 63       "coronary arteriosclerosis" on death certificate   Heart attack Mother 10   Transient ischemic attack Mother    Diabetes type II Mother     Sudden death Mother        died age 71   Diabetes Mother    Coronary artery disease Mother    Skin cancer Mother    Barrett's esophagus Mother    Arrhythmia Brother    Colon cancer Brother 74   Breast cancer Sister        dx in her 52s   Colon polyps Sister        adenomatous   Colon cancer Son 52       lynch syndrome   Melanoma Maternal Uncle    Congestive Heart Failure Maternal Grandmother    Prostate cancer Maternal Grandfather        dx in 44s   Tuberculosis Paternal Grandfather    Skin cancer Maternal Aunt    Barrett's esophagus Maternal Aunt    Other Son        parotid gland tumor   Ehlers-Danlos syndrome Daughter    Colon cancer Cousin        mid 29s; maternal cousin   Colon cancer Other        MGMs brother   Esophageal cancer Neg Hx    Stomach cancer Neg Hx    Rectal cancer Neg Hx     Social History   Socioeconomic History   Marital status: Married    Spouse name: Not on file   Number of children: 3   Years of education: Not on file   Highest education level: Not on file  Occupational History   Occupation: International aid/development worker    Comment: retired 2009    Employer: unemployed  Tobacco Use   Smoking status: Never   Smokeless tobacco: Never  Vaping Use   Vaping status: Never Used  Substance and Sexual Activity   Alcohol use: No    Alcohol/week: 0.0 standard drinks of alcohol    Comment: rarely beer   Drug use: No   Sexual activity: Not Currently  Other Topics Concern   Not on file  Social History Narrative   Retired International aid/development worker, 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)   Grown children   Never smoker no alcohol tobacco or drug use   Social Determinants of Corporate investment banker Strain: Low Risk  (08/28/2022)   Overall Financial Resource Strain (CARDIA)    Difficulty of Paying Living Expenses: Not hard at all  Food Insecurity: No Food Insecurity (08/28/2022)   Hunger Vital Sign    Worried About Running Out of Food in the Last Year: Never true     Ran Out of Food in the Last Year: Never true  Transportation Needs: No Transportation Needs (08/28/2022)   PRAPARE - Administrator, Civil Service (Medical): No    Lack of Transportation (Non-Medical): No  Physical Activity: Inactive (08/28/2022)   Exercise Vital Sign    Days of Exercise per Week: 0 days    Minutes of Exercise per Session: 0 min  Stress: No Stress Concern Present (08/28/2022)   [employer] of Occupational Health - Occupational Stress Questionnaire  Feeling of Stress : Not at all  Social Connections: Moderately Integrated (08/28/2022)   Social Connection and Isolation Panel [NHANES]    Frequency of Communication with Friends and Family: More than three times a week    Frequency of Social Gatherings with Friends and Family: More than three times a week    Attends Religious Services: Never    Database administrator or Organizations: Yes    Attends Engineer, structural: More than 4 times per year    Marital Status: Married    Current Medications:  Current Outpatient Medications:    [START ON 12/11/2022] bisacodyl (DULCOLAX) 5 MG EC tablet, At 7 am the day before surgery, take four tablets by mouth once, Disp: 4 tablet, Rfl: 0   [START ON 12/11/2022] metroNIDAZOLE (FLAGYL) 500 MG tablet, On the day before surgery, plan on taking two tablets (1000 mg total) at 2 pm, 3 pm, and 10 pm., Disp: 6 tablet, Rfl: 0   [START ON 12/11/2022] polyethylene glycol powder (MIRALAX) 17 GM/SCOOP powder, Starting at 10:00 am the day before surgery, mix 1/2 bottle of Miralax with the 32 ounces of Gatorade. Drink the Gatorade/Miralax mixture gradually (8 oz glass every 15 minutes) until gone. You should finish in 4 hours, Disp: 255 g, Rfl: 0   apixaban (ELIQUIS) 5 MG TABS tablet, Take 1 tablet (5 mg total) by mouth 2 (two) times daily., Disp: 60 tablet, Rfl: 2   Ascorbic Acid (VITAMIN C) 1000 MG tablet, Take 1,000 mg by mouth daily., Disp: , Rfl:    calcium citrate-vitamin  D (CITRACAL+D) 315-200 MG-UNIT per tablet, Take 1 tablet by mouth 2 (two) times daily., Disp: , Rfl:    CARBOPLATIN IV, Inject into the vein every 21 ( twenty-one) days. port, Disp: , Rfl:    Cholecalciferol (VITAMIN D-3) 5000 UNITS TABS, Take 5,000 Units by mouth daily., Disp: , Rfl:    Coenzyme Q10 (CO Q-10) 200 MG CAPS, Take 200 mg by mouth daily., Disp: , Rfl:    CRANBERRY PO, Take 650 mg by mouth daily., Disp: , Rfl:    doxycycline (VIBRAMYCIN) 100 MG capsule, Take 1 capsule (100 mg total) by mouth 2 (two) times daily., Disp: 14 capsule, Rfl: 0   Glucosamine-Chondroitin (GLUCOSAMINE CHONDR COMPLEX PO), Take 2 capsules by mouth daily. , Disp: , Rfl:    GRAPE SEED EXTRACT PO, Take 1 capsule by mouth daily., Disp: , Rfl:    ketoconazole (NIZORAL) 2 % cream, Apply 1 Application topically daily. To yeast rash, Disp: 30 g, Rfl: 1   lansoprazole (PREVACID) 15 MG capsule, Take 1 capsule (15 mg total) by mouth 2 (two) times daily before a meal. (Patient taking differently: Take 15 mg by mouth daily. Uses over the counter brand), Disp: , Rfl:    LIALDA 1.2 g EC tablet, Take 2 tablets by mouth twice daily, Disp: 120 tablet, Rfl: 2   lidocaine-prilocaine (EMLA) cream, Apply to affected area once, Disp: 30 g, Rfl: 3   milk thistle 175 MG tablet, Take 175 mg by mouth daily., Disp: , Rfl:    Omega-3 Fatty Acids (FISH OIL TRIPLE STRENGTH PO), Take 1 capsule by mouth daily., Disp: , Rfl:    ondansetron (ZOFRAN) 8 MG tablet, Take 1 tablet (8 mg total) by mouth every 8 (eight) hours as needed for nausea or vomiting. Start on the third day after carboplatin., Disp: 30 tablet, Rfl: 1   PACLITAXEL IV, Inject into the vein every 21 ( twenty-one) days. port, Disp: ,  Rfl:    Polyethyl Glycol-Propyl Glycol (SYSTANE OP), Place 1 drop into both eyes daily., Disp: , Rfl:    rosuvastatin (CRESTOR) 20 MG tablet, Take 1 tablet (20 mg total) by mouth at bedtime., Disp: 90 tablet, Rfl: 3   traZODone (DESYREL) 50 MG tablet,  Take 1 tablet (50 mg total) by mouth at bedtime as needed for sleep., Disp: 30 tablet, Rfl: 1   vitamin B-12 (CYANOCOBALAMIN) 1000 MCG tablet, Take 1,000 mcg by mouth daily., Disp: , Rfl:    zinc gluconate 50 MG tablet, Take 50 mg by mouth daily., Disp: , Rfl:   Review of Systems: Denies appetite changes, fevers, chills, fatigue, unexplained weight changes. Denies hearing loss, neck lumps or masses, mouth sores, ringing in ears or voice changes. Denies cough or wheezing.  Denies shortness of breath. Denies chest pain or palpitations. Denies leg swelling. Denies abdominal distention, pain, blood in stools, constipation, diarrhea, nausea, vomiting, or early satiety. Denies pain with intercourse, dysuria, frequency, hematuria or incontinence. Denies hot flashes, pelvic pain, vaginal bleeding or vaginal discharge.   Denies joint pain, back pain or muscle pain/cramps. Denies itching, rash, or wounds. Denies dizziness, headaches, numbness or seizures. Denies swollen lymph nodes or glands, denies easy bruising or bleeding. Denies anxiety, depression, confusion, or decreased concentration.  Physical Exam: BP 137/77 (BP Location: Left Arm, Patient Position: Sitting)   Pulse 97   Temp 98.4 F (36.9 C) (Oral)   Resp 20   Wt 171 lb (77.6 kg)   LMP 05/29/1996   SpO2 98%   BMI 31.79 kg/m  General: Alert, oriented, no acute distress. HEENT: Normocephalic, atraumatic, sclera anicteric. Chest: Decreased breath sounds at left lung base, otherwise lungs clear to auscultation bilaterally. Cardiovascular: Regular rate and rhythm, no murmurs.  Laboratory & Radiologic Studies:      Component Ref Range & Units 9 d ago 1 mo ago 2 mo ago  Cancer Antigen (CA) 125 0.0 - 38.1 U/mL 4,552.0 High  4,916.0 High  CM 7,356.0 High     CT C/A/P on 11/07/22: 1. Small to moderate volume ascites, decreased. 2. Diffuse omental soft tissue caking and thickening and enhancement of the right deep pelvic peritoneum,  compatible with peritoneal carcinomatosis, not substantially changed from 08/23/2022 CT. 3. Small to moderate posterior left pleural effusion, increased. Trace posterior right pleural effusion is stable. 4. No lymphadenopathy or additional findings of metastatic disease in the chest, abdomen or pelvis. 5. Chronic findings include: Three-vessel coronary atherosclerosis. Moderate hiatal hernia. Marked left colonic diverticulosis. Small cystocele. Aortic Atherosclerosis (ICD10-I70.0).  Assessment & Plan: Rachel Carlson is a 77 y.o. 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  I spoke with the patient and her husband about recent imaging.  She and I while there is some evidence of treatment response (decreased ascites, decreasing CA125) there has not been a significant difference in the omental caking and carcinomatosis.  Additionally, there has been an increase of a left-sided pleural effusion.  I was hoping that her CA125 would decrease further after her most recent cycle than it did.  By phone, we discussed the option of proceeding with diagnostic laparoscopy to assess feasibility of interval debulking surgery.  I reviewed again today that if findings support that debulking surgery is possible, then I will proceed with exploratory laparotomy, bilateral oophorectomy, unilateral salpingectomy, omentectomy, and maximal tumor debulking likely to include resection of sigmoid colon, and any other indicated procedures.  If on diagnostic laparoscopy, findings do not support  that interval debulking surgery is feasible, then additional tissue biopsy may be performed laparoscopically.  We reviewed the plan for a diagnostic laparoscopy, possible total hysterectomy, bilateral salpingo-oophorectomy, tumor debulking including bowel surgery. The risks of surgery were discussed in detail and she understands these to include infection; wound separation; hernia; vaginal cuff  separation, injury to adjacent organs such as bowel, bladder, blood vessels, ureters and nerves; bleeding which may require blood transfusion; anesthesia risk; thromboembolic events; possible death; unforeseen complications; possible need for re-exploration; medical complications such as heart attack, stroke, pleural effusion and pneumonia; and, if full lymphadenectomy is performed the risk of lymphedema and lymphocyst. The patient will receive DVT and antibiotic prophylaxis as indicated. She voiced a clear understanding. She had the opportunity to ask questions. Perioperative instructions were reviewed with her. Prescriptions for post-op medications were sent to her pharmacy of choice.  In anticipation of general anesthesia with intubation, we will plan on a chest x-ray 1-2 weeks before surgery.  If at least a moderate pleural effusion, patient would benefit from thoracentesis preoperatively.  I have asked my office to reach out to her primary care provider about Eliquis recommendations.  Typically, we would stop this 3 days before surgery and restart prophylactic anticoagulation for a brief period after surgery before increasing back to therapeutic.  Given left acute DVT noticed in June, we will plan for repeat Dopplers prior to surgery.  Patient has been on Eliquis since that time.  I discussed that it would be okay for her to proceed with intravesicular Botox injection scheduled for next week.  38 minutes of total time was spent for this patient encounter, including preparation, face-to-face counseling with the patient and coordination of care, and documentation of the encounter.  Eugene Garnet, MD  Division of Gynecologic Oncology  Department of Obstetrics and Gynecology  [facility]

## 2022-11-23 NOTE — Patient Instructions (Signed)
Dr. Pricilla Holm would like for you to have a chest xray around 2 weeks before surgery (middle or end of next week). Based on these results, we may be arranging for a thoracentesis 1-2 days before surgery to draw the fluid off.   Preparing for your Surgery   Plan for surgery on December 12, 2022 with Dr. Eugene Garnet at [facility]. You will be scheduled for diagnostic laparoscopy (looking into the abdomen with a camera), possible open debulking surgery including bilateral salpingo-oophorectomy, omentectomy, debulking, possible bowel surgery.   Pre-operative Testing -You will receive a phone call from presurgical testing at [facility] to arrange for a pre-operative appointment and lab work.   -Bring your insurance card, copy of an advanced directive if applicable, medication list   -At that visit, you will be asked to sign a consent for a possible blood transfusion in case a transfusion becomes necessary during surgery.  The need for a blood transfusion is rare but having consent is a necessary part of your care.      -We typically advise to hold Eliquis three days before surgery but we will reach out to Dr. Milinda Antis for his recommendation.    -Do not take supplements such as fish oil (omega 3), red yeast rice, turmeric before your surgery. You want to avoid medications with aspirin in them including headache powders such as BC or Goody's), Excedrin migraine. STOP FISH OIL AT LEAST 7 DAYS BEFORE   Day Before Surgery at Home -Follow instructions for bowel prep the day before. You will be advised you can have clear liquids up until 3 hours before your surgery.     AVOID GAS PRODUCING FOODS AND BEVERAGES. Things to avoid include carbonated beverages (fizzy beverages, sodas).    If your bowels are filled with gas, your surgeon will have difficulty visualizing your pelvic organs which increases your surgical risks.   Your role in recovery Your role is to become active as soon as  directed by your doctor, while still giving yourself time to heal.  Rest when you feel tired. You will be asked to do the following in order to speed your recovery:   - Cough and breathe deeply. This helps to clear and expand your lungs and can prevent pneumonia after surgery.  - STAY ACTIVE WHEN YOU GET HOME. Do mild physical activity. Walking or moving your legs help your circulation and body functions return to normal. Do not try to get up or walk alone the first time after surgery.   -If you develop swelling on one leg or the other, pain in the back of your leg, redness/warmth in one of your legs, please call the office or go to the Emergency Room to have a doppler to rule out a blood clot. For shortness of breath, chest pain-seek care in the Emergency Room as soon as possible. - Actively manage your pain. Managing your pain lets you move in comfort. We will ask you to rate your pain on a scale of zero to 10. It is your responsibility to tell your doctor or nurse where and how much you hurt so your pain can be treated.   Special Considerations -If you are diabetic, you may be placed on insulin after surgery to have closer control over your blood sugars to promote healing and recovery.  This does not mean that you will be discharged on insulin.  If applicable, your oral antidiabetics will be resumed when you are tolerating a solid diet.   -  Your final pathology results from surgery should be available around one week after surgery and the results will be relayed to you when available.   -FMLA forms can be faxed to [phone] and please allow 5-7 business days for completion.   Pain Management After Surgery -You will be prescribed your pain medication and bowel regimen medications before surgery so that you can have these available when you are discharged from the hospital. The pain medication is for use ONLY AFTER surgery and a new prescription will not be given.    -Make sure that you have  Tylenol IF YOU ARE ABLE TO TAKE THESE MEDICATION at home to use on a regular basis after surgery for pain control.    -Review the attached handout on narcotic use and their risks and side effects.    Bowel Regimen -You will be prescribed Sennakot-S to take nightly to prevent constipation especially if you are taking the narcotic pain medication intermittently.  It is important to prevent constipation and drink adequate amounts of liquids. You can stop taking this medication when you are not taking pain medication and you are back on your normal bowel routine.   Risks of Surgery Risks of surgery are low but include bleeding, infection, damage to surrounding structures, re-operation, blood clots, and very rarely death.     Blood Transfusion Information (For the consent to be signed before surgery)   We will be checking your blood type before surgery so in case of emergencies, we will know what type of blood you would need.                                             WHAT IS A BLOOD TRANSFUSION?   A transfusion is the replacement of blood or some of its parts. Blood is made up of multiple cells which provide different functions. Red blood cells carry oxygen and are used for blood loss replacement. White blood cells fight against infection. Platelets control bleeding. Plasma helps clot blood. Other blood products are available for specialized needs, such as hemophilia or other clotting disorders. BEFORE THE TRANSFUSION  Who gives blood for transfusions?  You may be able to donate blood to be used at a later date on yourself (autologous donation). Relatives can be asked to donate blood. This is generally not any safer than if you have received blood from a stranger. The same precautions are taken to ensure safety when a relative's blood is donated. Healthy volunteers who are fully evaluated to make sure their blood is safe. This is blood bank blood. Transfusion therapy is the safest it has  ever been in the practice of medicine. Before blood is taken from a donor, a complete history is taken to make sure that person has no history of diseases nor engages in risky social behavior (examples are intravenous drug use or sexual activity with multiple partners). The donor's travel history is screened to minimize risk of transmitting infections, such as malaria. The donated blood is tested for signs of infectious diseases, such as HIV and hepatitis. The blood is then tested to be sure it is compatible with you in order to minimize the chance of a transfusion reaction. If you or a relative donates blood, this is often done in anticipation of surgery and is not appropriate for emergency situations. It takes many days to process the donated blood. RISKS  AND COMPLICATIONS Although transfusion therapy is very safe and saves many lives, the main dangers of transfusion include:  Getting an infectious disease. Developing a transfusion reaction. This is an allergic reaction to something in the blood you were given. Every precaution is taken to prevent this. The decision to have a blood transfusion has been considered carefully by your caregiver before blood is given. Blood is not given unless the benefits outweigh the risks.   AFTER SURGERY INSTRUCTIONS   Return to work: 4-6 weeks if applicable   You may have a white honeycomb dressing over your larger incision. This dressing can be removed 5 days after surgery and you do not need to reapply a new dressing. Once you remove the dressing, you will notice that you have the surgical glue (dermabond) on the incision and this will peel off on its own. You can get this dressing wet in the shower the days after surgery prior to removal on the 5th day.    Activity: 1. Be up and out of the bed during the day.  Take a nap if needed.  You may walk up steps but be careful and use the hand rail.  Stair climbing will tire you more than you think, you may need to stop  part way and rest.    2. No lifting or straining for 6 weeks over 10 pounds. No pushing, pulling, straining for 6 weeks.   3. No driving for around 2 week(s) if you have open surgery.  Do not drive if you are taking narcotic pain medicine and make sure that your reaction time has returned.    4. You can shower as soon as the next day after surgery. Shower daily.  Use your regular soap and water (not directly on the incision) and pat your incision(s) dry afterwards; don't rub.  No tub baths or submerging your body in water until cleared by your surgeon. If you have the soap that was given to you by pre-surgical testing that was used before surgery, you do not need to use it afterwards because this can irritate your incisions.    5. No sexual activity and nothing in the vagina for 6 weeks.   6. You may experience a small amount of clear drainage from your incisions, which is normal.  If the drainage persists, increases, or changes color please call the office.   7. Do not use creams, lotions, or ointments such as neosporin on your incisions after surgery until advised by your surgeon because they can cause removal of the dermabond glue on your incisions.     9. Take Tylenol first for pain if you are able to take these medication and only use narcotic pain medication for severe pain not relieved by the Tylenol.  Monitor your Tylenol intake to a max of 4,000 mg in a 24 hour period.    Diet: 1. Low sodium Heart Healthy Diet is recommended but you are cleared to resume your normal (before surgery) diet after your procedure.   2. It is safe to use a laxative, such as Miralax or Colace, if you have difficulty moving your bowels. You have been prescribed Sennakot-S to take at bedtime every evening after surgery to keep bowel movements regular and to prevent constipation.     Wound Care: 1. Keep clean and dry.  Shower daily.   Reasons to call the Doctor: Fever - Oral temperature greater than 100.4  degrees Fahrenheit Foul-smelling vaginal discharge Difficulty urinating Nausea and vomiting Increased  pain at the site of the incision that is unrelieved with pain medicine. Difficulty breathing with or without chest pain New calf pain especially if only on one side Sudden, continuing increased vaginal bleeding with or without clots.   Contacts: For questions or concerns you should contact:   Dr. Eugene Garnet at [phone]   Warner Mccreedy, NP at [phone]   After Hours: call [phone] and have the GYN Oncologist paged/contacted (after 5 pm or on the weekends). You will speak with an after hours RN and let he or she know you have had surgery.   Messages sent via mychart are for non-urgent matters and are not responded to after hours so for urgent needs, please call the after hours number.   COLON BOWEL PREP     FIVE DAYS PRIOR TO YOUR SURGERY   Obtain supplies for the bowel prep at a pharmacy of your choice: Office sent prescription for your antibiotic pills (Flagyl)  A bottle of Miralax (238 g)- prescription sent in A large bottle of Gatorade/Powerade (64 oz), avoid red/purple coloring- no prescription required Dulcolax tablets (4 tablets)- prescription sent in   Change your diet to make the bowel prep go more easily: Switch to a bland, low fiber diet Stop eating any nuts, popcorn, or fruit with seeds.  Stop all fiber supplements such as Metamucil, Miralax, etc.     Improve nutrition: Consider drinking 2-3 nutritional shakes every day, starting 5 days prior to surgery     DAY PRIOR TO SURGERY    Switch to a full liquid diet the day before surgery Drink plenty of liquids all day to avoid getting dehydrated      7:00 am Swallow 4 dulcolax tablets with some water   10:00 am Mix 1/2 bottle of Miralax with the 32 ounces of Gatorade Drink the Gatorade/Miralax mixture gradually (8 oz glass every 15 minutes) until gone. (You should finish in 4 hours)    2:00pm Take 2 Flagyl (total of 1000 mg) tablets   3:00pm Take 2 Flagyl (total of 1000 mg) tablets Drink plenty of clear liquids all evening to avoid getting dehydrated   10:00pm Take 2 Flagyl (total of 1000 mg) tablets Drink 2 pre-surgery nutrition drinks (ex: Gatorade 2). These will be given at pre-op appointment. Do not eat anything solid after bedtime (midnight) the night before your surgery.   BUT DO drink plenty of clear liquids (Water, Gatorade, juice, soda, coffee, tea, broths, etc.) up to 3 hours prior to surgery to avoid getting dehydrated.      MORNING OF SURGERY   Remember to not to eat anything solid that morning  Drink one final pre-surgery drink (ex: Gatorade 2) upon waking up in the morning (needs to be 3 hours before your surgery). Hold or take medications as recommended by the hospital staff at your Preoperative visit Stop drinking liquids before you leave the house (>3 hours prior to surgery)

## 2022-11-23 NOTE — H&P (View-Only) (Signed)
Gynecologic Oncology Return Clinic Visit  11/23/22  Reason for Visit: treatment planning  Treatment History: Oncology History  Carcinomatosis (HCC)  08/31/2022 Initial Diagnosis   Carcinomatosis (HCC)   09/12/2022 -  Chemotherapy   Patient is on Treatment Plan : OVARIAN Carboplatin (AUC 6) + Paclitaxel (175) q21d X 6 Cycles      The patient initially presented to the emergency department on 7/8 with a couple months of worsening abdominal distention.  She had previously had outpatient ultrasound which showed mild ascites, further workup was negative for cirrhosis (elastography).  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  Diverticulosis of the descending colon and sigmoid colon noted with moderate wall thickening of the distal sigmoid colon, possibly sequela of prior diverticulitis versus acute inflammation or underlying malignancy.  Small bilateral pleural effusions with adjacent subsegmental atelectasis noted.   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  She underwent colonoscopy on 09/07/2022 with apparent extraluminal compression in the distal sigmoid colon, approximately 20 cm from the anal verge.  Mucosa in his area was edematous and biopsied.  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  CA-125 on 7/28 was 7,356. CEA was normal (1).  C4 carboplatin/taxol on 11/14/22.  Interval History: Patient reports overall doing okay.  She continues to struggle with fatigue as well as decreased appetite.  Has mild occasional nausea.  Endorses daily bowel function now, not needing any medication.  Has some shortness of breath with activity, is able to catch her breath pretty quickly.  Coughs more at night.  Past Medical/Surgical  History: 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)    Esophageal stricture    Family history of breast cancer    Family history of colon cancer    Family history of melanoma    Family history of prostate cancer    Fibroids 1998   uterine, history of (left ovaries)   GERD (gastroesophageal reflux disease)    History of uterine prolapse    Hx of adenomatous polyp of colon 10/19/2014   Hyperlipidemia    Obesity    PVC (premature ventricular contraction)    first dx by holter in 1980's, echo (6/10) EF 55-60%, normal diastolic fxn, normal size RV and fxn, mild MR, PASP   Squamous cell carcinoma of arm    Tubal pregnancy    Ulcerative proctosigmoiditis (HCC)    Urge urinary incontinence 06/03/2007   Qualifier: Diagnosis of  By: Lindwood Qua CMA, Jerl Santos      Past Surgical History:  Procedure Laterality Date   BLADDER NECK SUSPENSION  1998   BREAST BIOPSY Left    BREAST LUMPECTOMY Left    benign   CATARACT EXTRACTION Right 01/2020   CATARACT EXTRACTION W/PHACO Left 08/12/2020   Procedure: CATARACT EXTRACTION PHACO AND INTRAOCULAR LENS PLACEMENT LEFT EYE;  Surgeon: Fabio Pierce, MD;  Location: [facility];  Service: Ophthalmology;  Laterality: Left;  left CDE=10.71   COLONOSCOPY  05/18/2011/10/12/14   Dr. Stan Head   ECTOPIC PREGNANCY SURGERY  1980   ESOPHAGOGASTRODUODENOSCOPY (EGD) WITH ESOPHAGEAL DILATION  04/03/2012   Dr. Stan Head   IR IMAGING GUIDED PORT INSERTION  09/06/2022  PARTIAL HYSTERECTOMY     abdominal, fibroids, and prolapse (1998) bladder tack   SKIN CANCER EXCISION     UPPER GASTROINTESTINAL ENDOSCOPY      Family History  Problem Relation Age of Onset   Sudden death Father 63       "coronary arteriosclerosis" on death certificate   Heart attack Mother 10   Transient ischemic attack Mother    Diabetes type II Mother     Sudden death Mother        died age 71   Diabetes Mother    Coronary artery disease Mother    Skin cancer Mother    Barrett's esophagus Mother    Arrhythmia Brother    Colon cancer Brother 74   Breast cancer Sister        dx in her 52s   Colon polyps Sister        adenomatous   Colon cancer Son 52       lynch syndrome   Melanoma Maternal Uncle    Congestive Heart Failure Maternal Grandmother    Prostate cancer Maternal Grandfather        dx in 44s   Tuberculosis Paternal Grandfather    Skin cancer Maternal Aunt    Barrett's esophagus Maternal Aunt    Other Son        parotid gland tumor   Ehlers-Danlos syndrome Daughter    Colon cancer Cousin        mid 29s; maternal cousin   Colon cancer Other        MGMs brother   Esophageal cancer Neg Hx    Stomach cancer Neg Hx    Rectal cancer Neg Hx     Social History   Socioeconomic History   Marital status: Married    Spouse name: Not on file   Number of children: 3   Years of education: Not on file   Highest education level: Not on file  Occupational History   Occupation: International aid/development worker    Comment: retired 2009    Employer: unemployed  Tobacco Use   Smoking status: Never   Smokeless tobacco: Never  Vaping Use   Vaping status: Never Used  Substance and Sexual Activity   Alcohol use: No    Alcohol/week: 0.0 standard drinks of alcohol    Comment: rarely beer   Drug use: No   Sexual activity: Not Currently  Other Topics Concern   Not on file  Social History Narrative   Retired International aid/development worker, 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)   Grown children   Never smoker no alcohol tobacco or drug use   Social Determinants of Corporate investment banker Strain: Low Risk  (08/28/2022)   Overall Financial Resource Strain (CARDIA)    Difficulty of Paying Living Expenses: Not hard at all  Food Insecurity: No Food Insecurity (08/28/2022)   Hunger Vital Sign    Worried About Running Out of Food in the Last Year: Never true     Ran Out of Food in the Last Year: Never true  Transportation Needs: No Transportation Needs (08/28/2022)   PRAPARE - Administrator, Civil Service (Medical): No    Lack of Transportation (Non-Medical): No  Physical Activity: Inactive (08/28/2022)   Exercise Vital Sign    Days of Exercise per Week: 0 days    Minutes of Exercise per Session: 0 min  Stress: No Stress Concern Present (08/28/2022)   [employer] of Occupational Health - Occupational Stress Questionnaire  Feeling of Stress : Not at all  Social Connections: Moderately Integrated (08/28/2022)   Social Connection and Isolation Panel [NHANES]    Frequency of Communication with Friends and Family: More than three times a week    Frequency of Social Gatherings with Friends and Family: More than three times a week    Attends Religious Services: Never    Database administrator or Organizations: Yes    Attends Engineer, structural: More than 4 times per year    Marital Status: Married    Current Medications:  Current Outpatient Medications:    [START ON 12/11/2022] bisacodyl (DULCOLAX) 5 MG EC tablet, At 7 am the day before surgery, take four tablets by mouth once, Disp: 4 tablet, Rfl: 0   [START ON 12/11/2022] metroNIDAZOLE (FLAGYL) 500 MG tablet, On the day before surgery, plan on taking two tablets (1000 mg total) at 2 pm, 3 pm, and 10 pm., Disp: 6 tablet, Rfl: 0   [START ON 12/11/2022] polyethylene glycol powder (MIRALAX) 17 GM/SCOOP powder, Starting at 10:00 am the day before surgery, mix 1/2 bottle of Miralax with the 32 ounces of Gatorade. Drink the Gatorade/Miralax mixture gradually (8 oz glass every 15 minutes) until gone. You should finish in 4 hours, Disp: 255 g, Rfl: 0   apixaban (ELIQUIS) 5 MG TABS tablet, Take 1 tablet (5 mg total) by mouth 2 (two) times daily., Disp: 60 tablet, Rfl: 2   Ascorbic Acid (VITAMIN C) 1000 MG tablet, Take 1,000 mg by mouth daily., Disp: , Rfl:    calcium citrate-vitamin  D (CITRACAL+D) 315-200 MG-UNIT per tablet, Take 1 tablet by mouth 2 (two) times daily., Disp: , Rfl:    CARBOPLATIN IV, Inject into the vein every 21 ( twenty-one) days. port, Disp: , Rfl:    Cholecalciferol (VITAMIN D-3) 5000 UNITS TABS, Take 5,000 Units by mouth daily., Disp: , Rfl:    Coenzyme Q10 (CO Q-10) 200 MG CAPS, Take 200 mg by mouth daily., Disp: , Rfl:    CRANBERRY PO, Take 650 mg by mouth daily., Disp: , Rfl:    doxycycline (VIBRAMYCIN) 100 MG capsule, Take 1 capsule (100 mg total) by mouth 2 (two) times daily., Disp: 14 capsule, Rfl: 0   Glucosamine-Chondroitin (GLUCOSAMINE CHONDR COMPLEX PO), Take 2 capsules by mouth daily. , Disp: , Rfl:    GRAPE SEED EXTRACT PO, Take 1 capsule by mouth daily., Disp: , Rfl:    ketoconazole (NIZORAL) 2 % cream, Apply 1 Application topically daily. To yeast rash, Disp: 30 g, Rfl: 1   lansoprazole (PREVACID) 15 MG capsule, Take 1 capsule (15 mg total) by mouth 2 (two) times daily before a meal. (Patient taking differently: Take 15 mg by mouth daily. Uses over the counter brand), Disp: , Rfl:    LIALDA 1.2 g EC tablet, Take 2 tablets by mouth twice daily, Disp: 120 tablet, Rfl: 2   lidocaine-prilocaine (EMLA) cream, Apply to affected area once, Disp: 30 g, Rfl: 3   milk thistle 175 MG tablet, Take 175 mg by mouth daily., Disp: , Rfl:    Omega-3 Fatty Acids (FISH OIL TRIPLE STRENGTH PO), Take 1 capsule by mouth daily., Disp: , Rfl:    ondansetron (ZOFRAN) 8 MG tablet, Take 1 tablet (8 mg total) by mouth every 8 (eight) hours as needed for nausea or vomiting. Start on the third day after carboplatin., Disp: 30 tablet, Rfl: 1   PACLITAXEL IV, Inject into the vein every 21 ( twenty-one) days. port, Disp: ,  Rfl:    Polyethyl Glycol-Propyl Glycol (SYSTANE OP), Place 1 drop into both eyes daily., Disp: , Rfl:    rosuvastatin (CRESTOR) 20 MG tablet, Take 1 tablet (20 mg total) by mouth at bedtime., Disp: 90 tablet, Rfl: 3   traZODone (DESYREL) 50 MG tablet,  Take 1 tablet (50 mg total) by mouth at bedtime as needed for sleep., Disp: 30 tablet, Rfl: 1   vitamin B-12 (CYANOCOBALAMIN) 1000 MCG tablet, Take 1,000 mcg by mouth daily., Disp: , Rfl:    zinc gluconate 50 MG tablet, Take 50 mg by mouth daily., Disp: , Rfl:   Review of Systems: Denies appetite changes, fevers, chills, fatigue, unexplained weight changes. Denies hearing loss, neck lumps or masses, mouth sores, ringing in ears or voice changes. Denies cough or wheezing.  Denies shortness of breath. Denies chest pain or palpitations. Denies leg swelling. Denies abdominal distention, pain, blood in stools, constipation, diarrhea, nausea, vomiting, or early satiety. Denies pain with intercourse, dysuria, frequency, hematuria or incontinence. Denies hot flashes, pelvic pain, vaginal bleeding or vaginal discharge.   Denies joint pain, back pain or muscle pain/cramps. Denies itching, rash, or wounds. Denies dizziness, headaches, numbness or seizures. Denies swollen lymph nodes or glands, denies easy bruising or bleeding. Denies anxiety, depression, confusion, or decreased concentration.  Physical Exam: BP 137/77 (BP Location: Left Arm, Patient Position: Sitting)   Pulse 97   Temp 98.4 F (36.9 C) (Oral)   Resp 20   Wt 171 lb (77.6 kg)   LMP 05/29/1996   SpO2 98%   BMI 31.79 kg/m  General: Alert, oriented, no acute distress. HEENT: Normocephalic, atraumatic, sclera anicteric. Chest: Decreased breath sounds at left lung base, otherwise lungs clear to auscultation bilaterally. Cardiovascular: Regular rate and rhythm, no murmurs.  Laboratory & Radiologic Studies:      Component Ref Range & Units 9 d ago 1 mo ago 2 mo ago  Cancer Antigen (CA) 125 0.0 - 38.1 U/mL 4,552.0 High  4,916.0 High  CM 7,356.0 High     CT C/A/P on 11/07/22: 1. Small to moderate volume ascites, decreased. 2. Diffuse omental soft tissue caking and thickening and enhancement of the right deep pelvic peritoneum,  compatible with peritoneal carcinomatosis, not substantially changed from 08/23/2022 CT. 3. Small to moderate posterior left pleural effusion, increased. Trace posterior right pleural effusion is stable. 4. No lymphadenopathy or additional findings of metastatic disease in the chest, abdomen or pelvis. 5. Chronic findings include: Three-vessel coronary atherosclerosis. Moderate hiatal hernia. Marked left colonic diverticulosis. Small cystocele. Aortic Atherosclerosis (ICD10-I70.0).  Assessment & Plan: Rachel Carlson is a 77 y.o. 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  I spoke with the patient and her husband about recent imaging.  She and I while there is some evidence of treatment response (decreased ascites, decreasing CA125) there has not been a significant difference in the omental caking and carcinomatosis.  Additionally, there has been an increase of a left-sided pleural effusion.  I was hoping that her CA125 would decrease further after her most recent cycle than it did.  By phone, we discussed the option of proceeding with diagnostic laparoscopy to assess feasibility of interval debulking surgery.  I reviewed again today that if findings support that debulking surgery is possible, then I will proceed with exploratory laparotomy, bilateral oophorectomy, unilateral salpingectomy, omentectomy, and maximal tumor debulking likely to include resection of sigmoid colon, and any other indicated procedures.  If on diagnostic laparoscopy, findings do not support  that interval debulking surgery is feasible, then additional tissue biopsy may be performed laparoscopically.  We reviewed the plan for a diagnostic laparoscopy, possible total hysterectomy, bilateral salpingo-oophorectomy, tumor debulking including bowel surgery. The risks of surgery were discussed in detail and she understands these to include infection; wound separation; hernia; vaginal cuff  separation, injury to adjacent organs such as bowel, bladder, blood vessels, ureters and nerves; bleeding which may require blood transfusion; anesthesia risk; thromboembolic events; possible death; unforeseen complications; possible need for re-exploration; medical complications such as heart attack, stroke, pleural effusion and pneumonia; and, if full lymphadenectomy is performed the risk of lymphedema and lymphocyst. The patient will receive DVT and antibiotic prophylaxis as indicated. She voiced a clear understanding. She had the opportunity to ask questions. Perioperative instructions were reviewed with her. Prescriptions for post-op medications were sent to her pharmacy of choice.  In anticipation of general anesthesia with intubation, we will plan on a chest x-ray 1-2 weeks before surgery.  If at least a moderate pleural effusion, patient would benefit from thoracentesis preoperatively.  I have asked my office to reach out to her primary care provider about Eliquis recommendations.  Typically, we would stop this 3 days before surgery and restart prophylactic anticoagulation for a brief period after surgery before increasing back to therapeutic.  Given left acute DVT noticed in June, we will plan for repeat Dopplers prior to surgery.  Patient has been on Eliquis since that time.  I discussed that it would be okay for her to proceed with intravesicular Botox injection scheduled for next week.  38 minutes of total time was spent for this patient encounter, including preparation, face-to-face counseling with the patient and coordination of care, and documentation of the encounter.  Eugene Garnet, MD  Division of Gynecologic Oncology  Department of Obstetrics and Gynecology  [facility]

## 2022-11-23 NOTE — Patient Instructions (Addendum)
Dr. Pricilla Holm would like for you to have a chest xray around 2 weeks before surgery (middle or end of next week). Based on these results, we may be arranging for a thoracentesis 1-2 days before surgery to draw the fluid off.   Preparing for your Surgery   Plan for surgery on December 12, 2022 with Dr. Eugene Garnet at [facility]. You will be scheduled for diagnostic laparoscopy (looking into the abdomen with a camera), possible open debulking surgery including bilateral salpingo-oophorectomy, omentectomy, debulking, possible bowel surgery.   Pre-operative Testing -You will receive a phone call from presurgical testing at [facility] to arrange for a pre-operative appointment and lab work.   -Bring your insurance card, copy of an advanced directive if applicable, medication list   -At that visit, you will be asked to sign a consent for a possible blood transfusion in case a transfusion becomes necessary during surgery.  The need for a blood transfusion is rare but having consent is a necessary part of your care.      -We typically advise to hold Eliquis three days before surgery but we will reach out to Dr. Milinda Antis for his recommendation.    -Do not take supplements such as fish oil (omega 3), red yeast rice, turmeric before your surgery. You want to avoid medications with aspirin in them including headache powders such as BC or Goody's), Excedrin migraine. STOP FISH OIL AT LEAST 7 DAYS BEFORE   Day Before Surgery at Home -Follow instructions for bowel prep the day before. You will be advised you can have clear liquids up until 3 hours before your surgery.     AVOID GAS PRODUCING FOODS AND BEVERAGES. Things to avoid include carbonated beverages (fizzy beverages, sodas).    If your bowels are filled with gas, your surgeon will have difficulty visualizing your pelvic organs which increases your surgical risks.   Your role in recovery Your role is to become active as soon as  directed by your doctor, while still giving yourself time to heal.  Rest when you feel tired. You will be asked to do the following in order to speed your recovery:   - Cough and breathe deeply. This helps to clear and expand your lungs and can prevent pneumonia after surgery.  - STAY ACTIVE WHEN YOU GET HOME. Do mild physical activity. Walking or moving your legs help your circulation and body functions return to normal. Do not try to get up or walk alone the first time after surgery.   -If you develop swelling on one leg or the other, pain in the back of your leg, redness/warmth in one of your legs, please call the office or go to the Emergency Room to have a doppler to rule out a blood clot. For shortness of breath, chest pain-seek care in the Emergency Room as soon as possible. - Actively manage your pain. Managing your pain lets you move in comfort. We will ask you to rate your pain on a scale of zero to 10. It is your responsibility to tell your doctor or nurse where and how much you hurt so your pain can be treated.   Special Considerations -If you are diabetic, you may be placed on insulin after surgery to have closer control over your blood sugars to promote healing and recovery.  This does not mean that you will be discharged on insulin.  If applicable, your oral antidiabetics will be resumed when you are tolerating a solid diet.   -  Your final pathology results from surgery should be available around one week after surgery and the results will be relayed to you when available.   -FMLA forms can be faxed to [phone] and please allow 5-7 business days for completion.   Pain Management After Surgery -You will be prescribed your pain medication and bowel regimen medications before surgery so that you can have these available when you are discharged from the hospital. The pain medication is for use ONLY AFTER surgery and a new prescription will not be given.    -Make sure that you have  Tylenol IF YOU ARE ABLE TO TAKE THESE MEDICATION at home to use on a regular basis after surgery for pain control.    -Review the attached handout on narcotic use and their risks and side effects.    Bowel Regimen -You will be prescribed Sennakot-S to take nightly to prevent constipation especially if you are taking the narcotic pain medication intermittently.  It is important to prevent constipation and drink adequate amounts of liquids. You can stop taking this medication when you are not taking pain medication and you are back on your normal bowel routine.   Risks of Surgery Risks of surgery are low but include bleeding, infection, damage to surrounding structures, re-operation, blood clots, and very rarely death.     Blood Transfusion Information (For the consent to be signed before surgery)   We will be checking your blood type before surgery so in case of emergencies, we will know what type of blood you would need.                                             WHAT IS A BLOOD TRANSFUSION?   A transfusion is the replacement of blood or some of its parts. Blood is made up of multiple cells which provide different functions. Red blood cells carry oxygen and are used for blood loss replacement. White blood cells fight against infection. Platelets control bleeding. Plasma helps clot blood. Other blood products are available for specialized needs, such as hemophilia or other clotting disorders. BEFORE THE TRANSFUSION  Who gives blood for transfusions?  You may be able to donate blood to be used at a later date on yourself (autologous donation). Relatives can be asked to donate blood. This is generally not any safer than if you have received blood from a stranger. The same precautions are taken to ensure safety when a relative's blood is donated. Healthy volunteers who are fully evaluated to make sure their blood is safe. This is blood bank blood. Transfusion therapy is the safest it has  ever been in the practice of medicine. Before blood is taken from a donor, a complete history is taken to make sure that person has no history of diseases nor engages in risky social behavior (examples are intravenous drug use or sexual activity with multiple partners). The donor's travel history is screened to minimize risk of transmitting infections, such as malaria. The donated blood is tested for signs of infectious diseases, such as HIV and hepatitis. The blood is then tested to be sure it is compatible with you in order to minimize the chance of a transfusion reaction. If you or a relative donates blood, this is often done in anticipation of surgery and is not appropriate for emergency situations. It takes many days to process the donated blood. RISKS  AND COMPLICATIONS Although transfusion therapy is very safe and saves many lives, the main dangers of transfusion include:  Getting an infectious disease. Developing a transfusion reaction. This is an allergic reaction to something in the blood you were given. Every precaution is taken to prevent this. The decision to have a blood transfusion has been considered carefully by your caregiver before blood is given. Blood is not given unless the benefits outweigh the risks.   AFTER SURGERY INSTRUCTIONS   Return to work: 4-6 weeks if applicable   You may have a white honeycomb dressing over your larger incision. This dressing can be removed 5 days after surgery and you do not need to reapply a new dressing. Once you remove the dressing, you will notice that you have the surgical glue (dermabond) on the incision and this will peel off on its own. You can get this dressing wet in the shower the days after surgery prior to removal on the 5th day.    Activity: 1. Be up and out of the bed during the day.  Take a nap if needed.  You may walk up steps but be careful and use the hand rail.  Stair climbing will tire you more than you think, you may need to stop  part way and rest.    2. No lifting or straining for 6 weeks over 10 pounds. No pushing, pulling, straining for 6 weeks.   3. No driving for around 2 week(s) if you have open surgery.  Do not drive if you are taking narcotic pain medicine and make sure that your reaction time has returned.    4. You can shower as soon as the next day after surgery. Shower daily.  Use your regular soap and water (not directly on the incision) and pat your incision(s) dry afterwards; don't rub.  No tub baths or submerging your body in water until cleared by your surgeon. If you have the soap that was given to you by pre-surgical testing that was used before surgery, you do not need to use it afterwards because this can irritate your incisions.    5. No sexual activity and nothing in the vagina for 6 weeks.   6. You may experience a small amount of clear drainage from your incisions, which is normal.  If the drainage persists, increases, or changes color please call the office.   7. Do not use creams, lotions, or ointments such as neosporin on your incisions after surgery until advised by your surgeon because they can cause removal of the dermabond glue on your incisions.     9. Take Tylenol first for pain if you are able to take these medication and only use narcotic pain medication for severe pain not relieved by the Tylenol.  Monitor your Tylenol intake to a max of 4,000 mg in a 24 hour period.    Diet: 1. Low sodium Heart Healthy Diet is recommended but you are cleared to resume your normal (before surgery) diet after your procedure.   2. It is safe to use a laxative, such as Miralax or Colace, if you have difficulty moving your bowels. You have been prescribed Sennakot-S to take at bedtime every evening after surgery to keep bowel movements regular and to prevent constipation.     Wound Care: 1. Keep clean and dry.  Shower daily.   Reasons to call the Doctor: Fever - Oral temperature greater than 100.4  degrees Fahrenheit Foul-smelling vaginal discharge Difficulty urinating Nausea and vomiting Increased  pain at the site of the incision that is unrelieved with pain medicine. Difficulty breathing with or without chest pain New calf pain especially if only on one side Sudden, continuing increased vaginal bleeding with or without clots.   Contacts: For questions or concerns you should contact:   Dr. Eugene Garnet at [phone]   Warner Mccreedy, NP at [phone]   After Hours: call [phone] and have the GYN Oncologist paged/contacted (after 5 pm or on the weekends). You will speak with an after hours RN and let he or she know you have had surgery.   Messages sent via mychart are for non-urgent matters and are not responded to after hours so for urgent needs, please call the after hours number.   COLON BOWEL PREP     FIVE DAYS PRIOR TO YOUR SURGERY   Obtain supplies for the bowel prep at a pharmacy of your choice: Office sent prescription for your antibiotic pills (Flagyl)  A bottle of Miralax (238 g)- prescription sent in A large bottle of Gatorade/Powerade (64 oz), avoid red/purple coloring- no prescription required Dulcolax tablets (4 tablets)- prescription sent in   Change your diet to make the bowel prep go more easily: Switch to a bland, low fiber diet Stop eating any nuts, popcorn, or fruit with seeds.  Stop all fiber supplements such as Metamucil, Miralax, etc.     Improve nutrition: Consider drinking 2-3 nutritional shakes every day, starting 5 days prior to surgery     DAY PRIOR TO SURGERY    Switch to a full liquid diet the day before surgery Drink plenty of liquids all day to avoid getting dehydrated      7:00 am Swallow 4 dulcolax tablets with some water   10:00 am Mix 1/2 bottle of Miralax with the 32 ounces of Gatorade Drink the Gatorade/Miralax mixture gradually (8 oz glass every 15 minutes) until gone. (You should finish in 4 hours)    2:00pm Take 2 Flagyl (total of 1000 mg) tablets   3:00pm Take 2 Flagyl (total of 1000 mg) tablets Drink plenty of clear liquids all evening to avoid getting dehydrated   10:00pm Take 2 Flagyl (total of 1000 mg) tablets Drink 2 pre-surgery nutrition drinks (ex: Gatorade 2). These will be given at pre-op appointment. Do not eat anything solid after bedtime (midnight) the night before your surgery.   BUT DO drink plenty of clear liquids (Water, Gatorade, juice, soda, coffee, tea, broths, etc.) up to 3 hours prior to surgery to avoid getting dehydrated.      MORNING OF SURGERY   Remember to not to eat anything solid that morning  Drink one final pre-surgery drink (ex: Gatorade 2) upon waking up in the morning (needs to be 3 hours before your surgery). Hold or take medications as recommended by the hospital staff at your Preoperative visit Stop drinking liquids before you leave the house (>3 hours prior to surgery)

## 2022-11-23 NOTE — Progress Notes (Signed)
Patient here for follow up with Dr. Pricilla Holm and for a pre-operative appointment prior to her scheduled surgery on December 12, 2022. She is scheduled for diagnostic laparoscopy, possible open debulking surgery including bilateral salpingo-oophorectomy, omentectomy, debulking, possible bowel surgery. The surgery was discussed in detail.  See after visit summary for additional details. Visual aids used to discuss items related to surgery including the incentive spirometer, sequential compression stockings.   Discussed post-op pain management in detail including the aspects of the enhanced recovery pathway.  Advised her that a new prescription would be sent in for tramadol and it is only to be used for after her upcoming surgery.  We discussed the use of tylenol post-op and to monitor for a maximum of 4,000 mg in a 24 hour period.  Also prescribed sennakot to be used after surgery and to hold if having loose stools.  Discussed bowel regimen in detail.     Discussed the use of SCDs and measures to take at home to prevent DVT including frequent mobility.  Reportable signs and symptoms of DVT discussed. Post-operative instructions discussed and expectations for after surgery. Incisional care discussed as well including reportable signs and symptoms including erythema, drainage, wound separation.     10 minutes spent with the patient.  Verbalizing understanding of material discussed. No needs or concerns voiced at the end of the visit.   Advised patient to call for any needs.  Advised that her post-operative medications would be prescribed.  This appointment is included in the global surgical bundle as pre-operative teaching and has no charge.

## 2022-11-26 ENCOUNTER — Telehealth: Payer: Self-pay | Admitting: *Deleted

## 2022-11-26 NOTE — Telephone Encounter (Signed)
Per Dr Pricilla Holm fax surgical optimization form to PCP

## 2022-11-27 NOTE — Patient Instructions (Signed)
SURGICAL WAITING ROOM VISITATION Patients having surgery or a procedure may have no more than 2 support people in the waiting area - these visitors may rotate.    Children under the age of 33 must have an adult with them who is not the patient.  If the patient needs to stay at the hospital during part of their recovery, the visitor guidelines for inpatient rooms apply. Pre-op nurse will coordinate an appropriate time for 1 support person to accompany patient in pre-op.  This support person may not rotate.    Please refer to the [facility] website for the visitor guidelines for Inpatients (after your surgery is over and you are in a regular room).       Your procedure is scheduled on: 12-12-22   Report to [facility] Main Entrance    Report to admitting at 6:15 AM   Call this number if you have problems the morning of surgery [phone]   Do not eat food :After Midnight.   After Midnight you may have the following liquids until 5:30 AM DAY OF SURGERY  Water Non-Citrus Juices (without pulp, NO RED-Apple, White grape, White cranberry) Black Coffee (NO MILK/CREAM OR CREAMERS, sugar ok)  Clear Tea (NO MILK/CREAM OR CREAMERS, sugar ok) regular and decaf                             Plain Jell-O (NO RED)                                           Fruit ices (not with fruit pulp, NO RED)                                     Popsicles (NO RED)                                                               Sports drinks like Gatorade (NO RED)  Drink 2 Pre-surgery Ensure the evening before surgery                    The day of surgery:  Drink ONE (1) Pre-Surgery Clear Ensure by 5:30 AM the morning of surgery. Drink in one sitting. Do not sip.  This drink was given to you during your hospital  pre-op appointment visit. Nothing else to drink after completing the Pre-Surgery Clear Ensure.          If you have questions, please contact your surgeon's office.   FOLLOW BOWEL PREP  AND ANY ADDITIONAL PRE OP INSTRUCTIONS YOU RECEIVED FROM YOUR SURGEON'S OFFICE!!!     Oral Hygiene is also important to reduce your risk of infection.                                    Remember - BRUSH YOUR TEETH THE MORNING OF SURGERY WITH YOUR REGULAR TOOTHPASTE   Do NOT smoke after Midnight   Take these medicines the morning of surgery  with A SIP OF WATER:   Prevacid  Lialda  Ondansetron if needed  Stop all vitamins and herbal supplements 7 days before surgery  DO NOT TAKE ANY ORAL DIABETIC MEDICATIONS DAY OF YOUR SURGERY  Bring CPAP mask and tubing day of surgery.                              You may not have any metal on your body including hair pins, jewelry, and body piercing             Do not wear make-up, lotions, powders, perfumes or deodorant  Do not wear nail polish including gel and S&S, artificial/acrylic nails, or any other type of covering on natural nails including finger and toenails. If you have artificial nails, gel coating, etc. that needs to be removed by a nail salon please have this removed prior to surgery or surgery may need to be canceled/ delayed if the surgeon/ anesthesia feels like they are unable to be safely monitored.   Do not shave  48 hours prior to surgery.            Do not bring valuables to the hospital. [facility] IS NOT RESPONSIBLE   FOR VALUABLES.   Contacts, dentures or bridgework may not be worn into surgery.   Bring small overnight bag day of surgery.   DO NOT BRING YOUR HOME MEDICATIONS TO THE HOSPITAL. PHARMACY WILL DISPENSE MEDICATIONS LISTED ON YOUR MEDICATION LIST TO YOU DURING YOUR ADMISSION IN THE HOSPITAL!   Special Instructions: Bring a copy of your healthcare power of attorney and living will documents the day of surgery if you haven't scanned them before.              Please read over the following fact sheets you were given: IF YOU HAVE QUESTIONS ABOUT YOUR PRE-OP INSTRUCTIONS PLEASE CALL [phone]  If you received  a COVID test during your pre-op visit  it is requested that you wear a mask when out in public, stay away from anyone that may not be feeling well and notify your surgeon if you develop symptoms. If you test positive for Covid or have been in contact with anyone that has tested positive in the last 10 days please notify you surgeon.  [facility] - Preparing for Surgery Before surgery, you can play an important role.  Because skin is not sterile, your skin needs to be as free of germs as possible.  You can reduce the number of germs on your skin by washing with CHG (chlorahexidine gluconate) soap before surgery.  CHG is an antiseptic cleaner which kills germs and bonds with the skin to continue killing germs even after washing. Please DO NOT use if you have an allergy to CHG or antibacterial soaps.  If your skin becomes reddened/irritated stop using the CHG and inform your nurse when you arrive at Short Stay. Do not shave (including legs and underarms) for at least 48 hours prior to the first CHG shower.  You may shave your face/neck.  Please follow these instructions carefully:  1.  Shower with CHG Soap the night before surgery and the  morning of surgery.  2.  If you choose to wash your hair, wash your hair first as usual with your normal  shampoo.  3.  After you shampoo, rinse your hair and body thoroughly to remove the shampoo.  4.  Use CHG as you would any other liquid soap.  You can apply chg directly to the skin and wash.  Gently with a scrungie or clean washcloth.  5.  Apply the CHG Soap to your body ONLY FROM THE NECK DOWN.   Do   not use on face/ open                           Wound or open sores. Avoid contact with eyes, ears mouth and   genitals (private parts).                       Wash face,  Genitals (private parts) with your normal soap.             6.  Wash thoroughly, paying special attention to the area where your    surgery  will be performed.  7.   Thoroughly rinse your body with warm water from the neck down.  8.  DO NOT shower/wash with your normal soap after using and rinsing off the CHG Soap.                9.  Pat yourself dry with a clean towel.            10.  Wear clean pajamas.            11.  Place clean sheets on your bed the night of your first shower and do not  sleep with pets. Day of Surgery : Do not apply any lotions/deodorants the morning of surgery.  Please wear clean clothes to the hospital/surgery center.  FAILURE TO FOLLOW THESE INSTRUCTIONS MAY RESULT IN THE CANCELLATION OF YOUR SURGERY  PATIENT SIGNATURE_________________________________  NURSE SIGNATURE__________________________________  ________________________________________________________________________   WHAT IS A BLOOD TRANSFUSION? Blood Transfusion Information  A transfusion is the replacement of blood or some of its parts. Blood is made up of multiple cells which provide different functions. Red blood cells carry oxygen and are used for blood loss replacement. White blood cells fight against infection. Platelets control bleeding. Plasma helps clot blood. Other blood products are available for specialized needs, such as hemophilia or other clotting disorders. BEFORE THE TRANSFUSION  Who gives blood for transfusions?  Healthy volunteers who are fully evaluated to make sure their blood is safe. This is blood bank blood. Transfusion therapy is the safest it has ever been in the practice of medicine. Before blood is taken from a donor, a complete history is taken to make sure that person has no history of diseases nor engages in risky social behavior (examples are intravenous drug use or sexual activity with multiple partners). The donor's travel history is screened to minimize risk of transmitting infections, such as malaria. The donated blood is tested for signs of infectious diseases, such as HIV and hepatitis. The blood is then tested to be sure it is  compatible with you in order to minimize the chance of a transfusion reaction. If you or a relative donates blood, this is often done in anticipation of surgery and is not appropriate for emergency situations. It takes many days to process the donated blood. RISKS AND COMPLICATIONS Although transfusion therapy is very safe and saves many lives, the main dangers of transfusion include:  Getting an infectious disease. Developing a transfusion reaction. This is an allergic reaction to something in the blood you were given. Every precaution is taken to prevent this. The decision  to have a blood transfusion has been considered carefully by your caregiver before blood is given. Blood is not given unless the benefits outweigh the risks. AFTER THE TRANSFUSION Right after receiving a blood transfusion, you will usually feel much better and more energetic. This is especially true if your red blood cells have gotten low (anemic). The transfusion raises the level of the red blood cells which carry oxygen, and this usually causes an energy increase. The nurse administering the transfusion will monitor you carefully for complications. HOME CARE INSTRUCTIONS  No special instructions are needed after a transfusion. You may find your energy is better. Speak with your caregiver about any limitations on activity for underlying diseases you may have. SEEK MEDICAL CARE IF:  Your condition is not improving after your transfusion. You develop redness or irritation at the intravenous (IV) site. SEEK IMMEDIATE MEDICAL CARE IF:  Any of the following symptoms occur over the next 12 hours: Shaking chills. You have a temperature by mouth above 102 F (38.9 C), not controlled by medicine. Chest, back, or muscle pain. People around you feel you are not acting correctly or are confused. Shortness of breath or difficulty breathing. Dizziness and fainting. You get a rash or develop hives. You have a decrease in urine  output. Your urine turns a dark color or changes to pink, red, or brown. Any of the following symptoms occur over the next 10 days: You have a temperature by mouth above 102 F (38.9 C), not controlled by medicine. Shortness of breath. Weakness after normal activity. The white part of the eye turns yellow (jaundice). You have a decrease in the amount of urine or are urinating less often. Your urine turns a dark color or changes to pink, red, or brown. Document Released: 01/13/2000 Document Revised: 04/09/2011 Document Reviewed: 09/01/2007 [facility] Patient Information 2014 [place], [place].  _______________________________________________________________________

## 2022-11-28 ENCOUNTER — Telehealth: Payer: Self-pay

## 2022-11-28 ENCOUNTER — Encounter: Payer: Self-pay | Admitting: Hematology

## 2022-11-28 NOTE — Telephone Encounter (Signed)
Ms.Lafrance called office asking to speak to [name] Clinical research associate). She states she received a call from [facility] this morning confirming upcoming appointments. Reports being under the impression those were going to be canceled. Pt is aware [name] is out of the office today. I will send message and [name] will return call tomorrow once in office. Pt voiced an understanding stating she has a lot of appointments tomorrow and the best time for [name] to call would be between 8:00-9:00am.   Per Warner Mccreedy NP, I notified pt our office heard back from Dr.Tower's office. The recommendation continues to hold Eliquis 3 days before surgery if repeat doppler is ok. Also regarding closure of incision, Per Melissa, if Dr.Tucker does bowel surgery, staples may be used. Pt voices an understanding and was thankful for the call.

## 2022-11-28 NOTE — Telephone Encounter (Signed)
Received clearance from PCP

## 2022-11-29 ENCOUNTER — Ambulatory Visit (HOSPITAL_COMMUNITY)
Admission: RE | Admit: 2022-11-29 | Discharge: 2022-11-29 | Disposition: A | Discharge status: Home / Self Care | Payer: Medicare Other | Source: Ambulatory Visit | Attending: Gynecologic Oncology | Admitting: Gynecologic Oncology

## 2022-11-29 ENCOUNTER — Ambulatory Visit: Payer: Medicare Other | Admitting: Obstetrics and Gynecology

## 2022-11-29 ENCOUNTER — Telehealth: Payer: Self-pay | Admitting: Oncology

## 2022-11-29 ENCOUNTER — Telehealth: Payer: Self-pay | Admitting: Obstetrics and Gynecology

## 2022-11-29 DIAGNOSIS — I82402 Acute embolism and thrombosis of unspecified deep veins of left lower extremity: Secondary | ICD-10-CM | POA: Insufficient documentation

## 2022-11-29 DIAGNOSIS — C800 Disseminated malignant neoplasm, unspecified: Secondary | ICD-10-CM | POA: Diagnosis not present

## 2022-11-29 DIAGNOSIS — R180 Malignant ascites: Secondary | ICD-10-CM | POA: Insufficient documentation

## 2022-11-29 NOTE — Telephone Encounter (Signed)
Called patient because on chart review noted that she had been started on Eliquis. We usually recommend stopping the Eliquis prior to any procedure and she is scheduled for Botox in the office. She has surgery with Dr Pricilla Holm scheduled on 11/13 and is planning on stopping the eliquis after 11/9. Therefore, will have her come to the office for botox on 11/12 with Dr Olena Leatherwood. Will still plan to do 150 units as was done the last injection. Pt reports it has really helped her symptoms.   Marguerita Beards, MD

## 2022-11-29 NOTE — Telephone Encounter (Signed)
Called Rachel Carlson and discussed that her appointments on 12/05/22 at [facility] will be canceled because she is scheduled for surgery.  Discussed that we will coordinate with Dr. Marice Potter office after surgery to have them rescheduled.  She verbalized understanding.  Called [facility] and advised that appointments for 12/05/22 will need to be canceled since patient is scheduled for surgery on 12/12/22.  They will send a note to Kennith Gain, RN Navigator and will reach out to Rachel Carlson.

## 2022-11-30 ENCOUNTER — Encounter (HOSPITAL_COMMUNITY): Payer: Self-pay

## 2022-11-30 ENCOUNTER — Ambulatory Visit (HOSPITAL_COMMUNITY)
Admission: RE | Admit: 2022-11-30 | Discharge: 2022-11-30 | Disposition: A | Discharge status: Home / Self Care | Payer: Medicare Other | Source: Ambulatory Visit | Attending: Gynecologic Oncology | Admitting: Gynecologic Oncology

## 2022-11-30 ENCOUNTER — Encounter (HOSPITAL_COMMUNITY)
Admission: RE | Admit: 2022-11-30 | Discharge: 2022-11-30 | Disposition: A | Discharge status: Home / Self Care | Payer: Medicare Other | Source: Ambulatory Visit | Attending: Anesthesiology | Admitting: Anesthesiology

## 2022-11-30 ENCOUNTER — Telehealth: Payer: Self-pay | Admitting: Oncology

## 2022-11-30 DIAGNOSIS — J90 Pleural effusion, not elsewhere classified: Secondary | ICD-10-CM | POA: Insufficient documentation

## 2022-11-30 DIAGNOSIS — K449 Diaphragmatic hernia without obstruction or gangrene: Secondary | ICD-10-CM | POA: Diagnosis not present

## 2022-11-30 DIAGNOSIS — M40204 Unspecified kyphosis, thoracic region: Secondary | ICD-10-CM | POA: Diagnosis not present

## 2022-11-30 NOTE — Telephone Encounter (Signed)
Called Rachel Carlson back and advised according to Dr. Milinda Antis, to hold her Eliquis for 3 days before surgery.  Rachel Carlson verbalized agreement and will restart the Eliquis and will hold it starting 12/08/22 before her Botox procedure.

## 2022-11-30 NOTE — Telephone Encounter (Signed)
Rachel Carlson called regarding canceled pre admission testing appointment today.  Advised her that I talked with pre admission testing and they will call her to reschedule an appointment for early next week.  Rachel Carlson verbalized understanding and said she was planning on going to [facility] today for the chest x-ray but is going to go to [facility] instead since the preop apt was canceled.    She also said she stopped taking Eliquis yesterday since her botox procedure was canceled and rescheduled for 11/12. Advised her that I will check to see how long she needs to hold it before surgery and her botox procedure and will call her back.

## 2022-12-03 ENCOUNTER — Telehealth: Payer: Self-pay

## 2022-12-03 NOTE — Telephone Encounter (Signed)
Rachel Carlson aware of negative doppler results. She is aware our office will notify her of the chest x-ray results once we get them. She was thankful for the call.

## 2022-12-03 NOTE — Telephone Encounter (Signed)
-----   Message from Doylene Bode sent at 12/03/2022  9:33 AM EST ----- Please let her know her doppler was negative. Good news. We are still waiting for the chest xray to be read. ----- Message ----- From: Interface, [employer] Sent: 11/29/2022   3:37 PM EST To: Doylene Bode, NP

## 2022-12-05 ENCOUNTER — Inpatient Hospital Stay: Payer: Medicare Other

## 2022-12-05 ENCOUNTER — Telehealth: Payer: Self-pay | Admitting: Oncology

## 2022-12-05 ENCOUNTER — Other Ambulatory Visit: Payer: Self-pay | Admitting: Gynecologic Oncology

## 2022-12-05 ENCOUNTER — Inpatient Hospital Stay: Payer: Medicare Other | Admitting: Hematology

## 2022-12-05 DIAGNOSIS — J90 Pleural effusion, not elsewhere classified: Secondary | ICD-10-CM

## 2022-12-05 NOTE — Progress Notes (Signed)
Great thank you!

## 2022-12-05 NOTE — Telephone Encounter (Signed)
Called Rachel Carlson and advised her of the CT chest results and that we have scheduled a thoracentesis at [facility] at 8:00 (7:30 arrival) on Monday, 12/10/2022.  Advised she does not need to fast for the procedure.  She verbalized understanding and agreement.

## 2022-12-05 NOTE — Progress Notes (Signed)
Based on chest xray, plan for thoracentesis preop. To be set up at [facility].

## 2022-12-06 ENCOUNTER — Inpatient Hospital Stay: Payer: Medicare Other | Attending: Hematology | Admitting: Dietician

## 2022-12-06 ENCOUNTER — Encounter (HOSPITAL_COMMUNITY)
Admission: RE | Admit: 2022-12-06 | Discharge: 2022-12-06 | Disposition: A | Discharge status: Home / Self Care | Payer: Medicare Other | Source: Ambulatory Visit | Attending: Gynecologic Oncology | Admitting: Gynecologic Oncology

## 2022-12-06 ENCOUNTER — Encounter (HOSPITAL_COMMUNITY): Payer: Self-pay

## 2022-12-06 ENCOUNTER — Other Ambulatory Visit: Payer: Self-pay

## 2022-12-06 ENCOUNTER — Telehealth: Payer: Self-pay | Admitting: Dietician

## 2022-12-06 DIAGNOSIS — C579 Malignant neoplasm of female genital organ, unspecified: Secondary | ICD-10-CM | POA: Insufficient documentation

## 2022-12-06 DIAGNOSIS — Z01812 Encounter for preprocedural laboratory examination: Secondary | ICD-10-CM | POA: Diagnosis not present

## 2022-12-06 DIAGNOSIS — L539 Erythematous condition, unspecified: Secondary | ICD-10-CM | POA: Insufficient documentation

## 2022-12-06 DIAGNOSIS — C800 Disseminated malignant neoplasm, unspecified: Secondary | ICD-10-CM | POA: Insufficient documentation

## 2022-12-06 HISTORY — DX: Essential (primary) hypertension: I10

## 2022-12-06 HISTORY — DX: Myoneural disorder, unspecified: G70.9

## 2022-12-06 HISTORY — DX: Personal history of other diseases of the digestive system: Z87.19

## 2022-12-06 LAB — CBC WITH DIFFERENTIAL/PLATELET
Abs Immature Granulocytes: 0.01 10*3/uL (ref 0.00–0.07)
Basophils Absolute: 0 10*3/uL (ref 0.0–0.1)
Basophils Relative: 1 %
Eosinophils Absolute: 0 10*3/uL (ref 0.0–0.5)
Eosinophils Relative: 0 %
HCT: 38.4 % (ref 36.0–46.0)
Hemoglobin: 12.7 g/dL (ref 12.0–15.0)
Immature Granulocytes: 0 %
Lymphocytes Relative: 20 %
Lymphs Abs: 1.1 10*3/uL (ref 0.7–4.0)
MCH: 30.8 pg (ref 26.0–34.0)
MCHC: 33.1 g/dL (ref 30.0–36.0)
MCV: 93.2 fL (ref 80.0–100.0)
Monocytes Absolute: 0.6 10*3/uL (ref 0.1–1.0)
Monocytes Relative: 12 %
Neutro Abs: 3.5 10*3/uL (ref 1.7–7.7)
Neutrophils Relative %: 67 %
Platelets: 212 10*3/uL (ref 150–400)
RBC: 4.12 MIL/uL (ref 3.87–5.11)
RDW: 19.3 % — ABNORMAL HIGH (ref 11.5–15.5)
WBC: 5.3 10*3/uL (ref 4.0–10.5)
nRBC: 0 % (ref 0.0–0.2)

## 2022-12-06 LAB — COMPREHENSIVE METABOLIC PANEL
ALT: 12 U/L (ref 0–44)
AST: 14 U/L — ABNORMAL LOW (ref 15–41)
Albumin: 3.8 g/dL (ref 3.5–5.0)
Alkaline Phosphatase: 52 U/L (ref 38–126)
Anion gap: 12 (ref 5–15)
BUN: 13 mg/dL (ref 8–23)
CO2: 23 mmol/L (ref 22–32)
Calcium: 9.8 mg/dL (ref 8.9–10.3)
Chloride: 105 mmol/L (ref 98–111)
Creatinine, Ser: 0.38 mg/dL — ABNORMAL LOW (ref 0.44–1.00)
GFR, Estimated: >60 mL/min (ref >60)
Glucose, Bld: 91 mg/dL (ref 70–99)
Potassium: 4.3 mmol/L (ref 3.5–5.1)
Sodium: 140 mmol/L (ref 135–145)
Total Bilirubin: 0.6 mg/dL (ref <1.2)
Total Protein: 7.1 g/dL (ref 6.5–8.1)

## 2022-12-06 NOTE — Telephone Encounter (Signed)
Attempted to contact patient via telephone for nutrition follow-up. Patient did not answer. Left VM with request for return call. Contact information provided.  

## 2022-12-06 NOTE — Progress Notes (Addendum)
PCP - Roxy Manns, MD Theron Arista 08-20-22 epic  Clearance form received per note in epic  Cardiologist - Rollene Rotunda, MD  LOV 04-17-22 epic Pulm- [name] , MD  LOV 05-07-22  PPM/ICD -  Device Orders -  Rep Notified -   Chest x-ray - 11-30-22 epic EKG - 04-17-22 epic Stress Test - 05-10-21 epic ECHO - 04-17-21 epic Cardiac Cath -   Sleep Study -  CPAP -   Fasting Blood Sugar -  Checks Blood Sugar _____ times a day  Blood Thinner Instructions:Elequis   Hold 3 days last dose a week ago stopped because of a botox procedure for overactive bladder Aspirin Instructions:  ERAS Protcol - PRE-SURGERY Ensure    COVID vaccine -yes  Activity--Able to complete ADL's without CP or SOB  Anesthesia review: Palpitations, PVC's, HTN, ,lung scarring , chronic cough, current chemo , Porta cath R chest, DVT left lower leg  Patient denies shortness of breath, fever, cough and chest pain at PAT appointment   All instructions explained to the patient, with a verbal understanding of the material. Patient agrees to go over the instructions while at home for a better understanding. Patient also instructed to self quarantine after being tested for COVID-19. The opportunity to ask questions was provided.

## 2022-12-06 NOTE — Patient Instructions (Addendum)
SURGICAL WAITING ROOM VISITATION Patients having surgery or a procedure may have no more than 2 support people in the waiting area - these visitors may rotate.    Children under the age of 62 must have an adult with them who is not the patient.  If the patient needs to stay at the hospital during part of their recovery, the visitor guidelines for inpatient rooms apply. Pre-op nurse will coordinate an appropriate time for 1 support person to accompany patient in pre-op.  This support person may not rotate.    Please refer to the [facility] website for the visitor guidelines for Inpatients (after your surgery is over and you are in a regular room).       Your procedure is scheduled on: 12-12-22   Report to [facility] Main Entrance    Report to admitting at    1030  AM   Call this number if you have problems the morning of surgery [phone]   Do not eat food :After Midnight.   After Midnight you may have the following liquids until 0945 AM DAY OF SURGERY   then nothing by mouth  Water Non-Citrus Juices (without pulp, NO RED-Apple, White grape, White cranberry) Black Coffee (NO MILK/CREAM OR CREAMERS, sugar ok)  Clear Tea (NO MILK/CREAM OR CREAMERS, sugar ok) regular and decaf                             Plain Jell-O (NO RED)                                           Fruit ices (not with fruit pulp, NO RED)                                     Popsicles (NO RED)                                                               Sports drinks like Gatorade (NO RED)  Drink 2 Pre-surgery Ensure the evening before surgery                    The day of surgery:  Drink ONE (1) Pre-Surgery Clear Ensure by 5:30 AM the morning of surgery. Drink in one sitting. Do not sip.  This drink was given to you during your hospital  pre-op appointment visit. Nothing else to drink after completing the Pre-Surgery Clear Ensure.          If you have questions, please contact your surgeon's  office.   FOLLOW BOWEL PREP AND ANY ADDITIONAL PRE OP INSTRUCTIONS YOU RECEIVED FROM YOUR SURGEON'S OFFICE!!!     Oral Hygiene is also important to reduce your risk of infection.                                    Remember - BRUSH YOUR TEETH THE MORNING OF SURGERY WITH YOUR REGULAR TOOTHPASTE   Do NOT smoke after  Midnight   Take these medicines the morning of surgery with A SIP OF WATER:   Prevacid  Lialda  Ondansetron if needed  Stop all vitamins and herbal supplements 7 days before surgery  DO NOT TAKE ANY ORAL DIABETIC MEDICATIONS DAY OF YOUR SURGERY  Bring CPAP mask and tubing day of surgery.                              You may not have any metal on your body including hair pins, jewelry, and body piercing             Do not wear make-up, lotions, powders, perfumes or deodorant  Do not wear nail polish including gel and S&S, artificial/acrylic nails, or any other type of covering on natural nails including finger and toenails. If you have artificial nails, gel coating, etc. that needs to be removed by a nail salon please have this removed prior to surgery or surgery may need to be canceled/ delayed if the surgeon/ anesthesia feels like they are unable to be safely monitored.   Do not shave  48 hours prior to surgery.            Do not bring valuables to the hospital. [facility] IS NOT RESPONSIBLE   FOR VALUABLES.   Contacts, dentures or bridgework may not be worn into surgery.   Bring small overnight bag day of surgery.   DO NOT BRING YOUR HOME MEDICATIONS TO THE HOSPITAL. PHARMACY WILL DISPENSE MEDICATIONS LISTED ON YOUR MEDICATION LIST TO YOU DURING YOUR ADMISSION IN THE HOSPITAL!   Special Instructions: Bring a copy of your healthcare power of attorney and living will documents the day of surgery if you haven't scanned them before.              Please read over the following fact sheets you were given: IF YOU HAVE QUESTIONS ABOUT YOUR PRE-OP INSTRUCTIONS PLEASE CALL  [phone]  . If you test positive for Covid or have been in contact with anyone that has tested positive in the last 10 days please notify you surgeon.  [facility] - Preparing for Surgery Before surgery, you can play an important role.  Because skin is not sterile, your skin needs to be as free of germs as possible.  You can reduce the number of germs on your skin by washing with CHG (chlorahexidine gluconate) soap before surgery.  CHG is an antiseptic cleaner which kills germs and bonds with the skin to continue killing germs even after washing. Please DO NOT use if you have an allergy to CHG or antibacterial soaps.  If your skin becomes reddened/irritated stop using the CHG and inform your nurse when you arrive at Short Stay. Do not shave (including legs and underarms) for at least 48 hours prior to the first CHG shower.  You may shave your face/neck.  Please follow these instructions carefully:  1.  Shower with CHG Soap the night before surgery and the  morning of surgery.  2.  If you choose to wash your hair, wash your hair first as usual with your normal  shampoo.  3.  After you shampoo, rinse your hair and body thoroughly to remove the shampoo.                             4.  Use CHG as you would any other liquid soap.  You can apply chg directly to the skin and wash.  Gently with a scrungie or clean washcloth.  5.  Apply the CHG Soap to your body ONLY FROM THE NECK DOWN.   Do   not use on face/ open                           Wound or open sores. Avoid contact with eyes, ears mouth and   genitals (private parts).                       Wash face,  Genitals (private parts) with your normal soap.             6.  Wash thoroughly, paying special attention to the area where your    surgery  will be performed.  7.  Thoroughly rinse your body with warm water from the neck down.  8.  DO NOT shower/wash with your normal soap after using and rinsing off the CHG Soap.                9.  Pat  yourself dry with a clean towel.            10.  Wear clean pajamas.            11.  Place clean sheets on your bed the night of your first shower and do not  sleep with pets. Day of Surgery : Do not apply any lotions/deodorants the morning of surgery.  Please wear clean clothes to the hospital/surgery center.  FAILURE TO FOLLOW THESE INSTRUCTIONS MAY RESULT IN THE CANCELLATION OF YOUR SURGERY  PATIENT SIGNATURE_________________________________  NURSE SIGNATURE__________________________________  ________________________________________________________________________   WHAT IS A BLOOD TRANSFUSION? Blood Transfusion Information  A transfusion is the replacement of blood or some of its parts. Blood is made up of multiple cells which provide different functions. Red blood cells carry oxygen and are used for blood loss replacement. White blood cells fight against infection. Platelets control bleeding. Plasma helps clot blood. Other blood products are available for specialized needs, such as hemophilia or other clotting disorders. BEFORE THE TRANSFUSION  Who gives blood for transfusions?  Healthy volunteers who are fully evaluated to make sure their blood is safe. This is blood bank blood. Transfusion therapy is the safest it has ever been in the practice of medicine. Before blood is taken from a donor, a complete history is taken to make sure that person has no history of diseases nor engages in risky social behavior (examples are intravenous drug use or sexual activity with multiple partners). The donor's travel history is screened to minimize risk of transmitting infections, such as malaria. The donated blood is tested for signs of infectious diseases, such as HIV and hepatitis. The blood is then tested to be sure it is compatible with you in order to minimize the chance of a transfusion reaction. If you or a relative donates blood, this is often done in anticipation of surgery and is not  appropriate for emergency situations. It takes many days to process the donated blood. RISKS AND COMPLICATIONS Although transfusion therapy is very safe and saves many lives, the main dangers of transfusion include:  Getting an infectious disease. Developing a transfusion reaction. This is an allergic reaction to something in the blood you were given. Every precaution is taken to prevent this. The decision to have a blood transfusion has been considered carefully by your caregiver  before blood is given. Blood is not given unless the benefits outweigh the risks. AFTER THE TRANSFUSION Right after receiving a blood transfusion, you will usually feel much better and more energetic. This is especially true if your red blood cells have gotten low (anemic). The transfusion raises the level of the red blood cells which carry oxygen, and this usually causes an energy increase. The nurse administering the transfusion will monitor you carefully for complications. HOME CARE INSTRUCTIONS  No special instructions are needed after a transfusion. You may find your energy is better. Speak with your caregiver about any limitations on activity for underlying diseases you may have. SEEK MEDICAL CARE IF:  Your condition is not improving after your transfusion. You develop redness or irritation at the intravenous (IV) site. SEEK IMMEDIATE MEDICAL CARE IF:  Any of the following symptoms occur over the next 12 hours: Shaking chills. You have a temperature by mouth above 102 F (38.9 C), not controlled by medicine. Chest, back, or muscle pain. People around you feel you are not acting correctly or are confused. Shortness of breath or difficulty breathing. Dizziness and fainting. You get a rash or develop hives. You have a decrease in urine output. Your urine turns a dark color or changes to pink, red, or brown. Any of the following symptoms occur over the next 10 days: You have a temperature by mouth above 102 F  (38.9 C), not controlled by medicine. Shortness of breath. Weakness after normal activity. The white part of the eye turns yellow (jaundice). You have a decrease in the amount of urine or are urinating less often. Your urine turns a dark color or changes to pink, red, or brown. Document Released: 01/13/2000 Document Revised: 04/09/2011 Document Reviewed: 09/01/2007 [facility] Patient Information 2014 [place], [place].  _______________________________________________________________________

## 2022-12-07 ENCOUNTER — Telehealth: Payer: Self-pay | Admitting: *Deleted

## 2022-12-07 NOTE — Telephone Encounter (Signed)
Patient called and stated that "I have a bowel prep on Tuesday for my procedure on Wednesday. I also have the Botox injection procedure on Tuesday too. My question is can I start the bowel prep two hours earlier so I will be more comfort at that procedure in the afternoon. (Not using the restroom, procedure at 2:40 pm) Explained that the message would be given to Dr Dorothyann Gibbs NP and the office will call her back later today.

## 2022-12-07 NOTE — Telephone Encounter (Signed)
Spoke with the patient and explained per Rachel Mccreedy NP "Yes she can. She may still be having bowel movements then as well ."

## 2022-12-07 NOTE — Progress Notes (Signed)
Case: [id number] Date/Time: 12/12/22 1230   Procedures:      LAPAROSCOPY DIAGNOSTIC     POSSIBLE OPEN BILATERAL SALPINGO OOPHORECTOMY (Bilateral)     POSSIBLE OMENTECTOMY     POSSIBLE DEBULKING, POSSIBLE BOWEL SURGERY   Anesthesia type: General   Pre-op diagnosis: ADVANCED GYN MALIGNANCY   Location: [facility] ROOM 05 / [facility]   Surgeons: Carver Fila, MD       DISCUSSION: Rachel Carlson is a 77 yo female who presents to PAT prior to surgery above. 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  Patient diagnosed with peritoneal carcinomatosis/ovarian cancer in July 2024. Follows with Oncology and is currently undergoing chemo. Also undergoing iron infusions for IDA. Last seen in clinic on 11/14/22. Seen in the ED 11/01/22 for fever/cough. CXR showed mild-mod L pleural effusion. Was treated with antibiotics for possible pneumonia. Repeat CXR on 11/1 showing moderate left and small right pleural effusions which do not appear significantly changed. Scheduled for thoracentesis on 11/11.  Takes Eliquis for DVT in LLE diagnosed in 06/2022. Repeat DVT study on 11/29/22 showed DVT has resolved. Eliquis hold***  VS: BP 139/81   Pulse 98   Temp 36.7 C (Oral)   Resp 17   Ht 5\' 2"  (1.575 m)   Wt 77.6 kg   LMP 05/29/1996   SpO2 98%   BMI 31.28 kg/m   PROVIDERS: [facility], Audrie Gallus, MD Oncology: Doreatha Massed, MD  Cardiologist - Rollene Rotunda, MD  LOV 04-17-22 epic Pulm- [name], MD  LOV 05-07-22  LABS: Labs reviewed: Acceptable for surgery. (all labs ordered are listed, but only abnormal results are displayed)  Labs Reviewed  CBC WITH DIFFERENTIAL/PLATELET - Abnormal; Notable for the following components:      Result Value   RDW 19.3 (*)    All other components within normal limits  COMPREHENSIVE METABOLIC  PANEL - Abnormal; Notable for the following components:   Creatinine, Ser 0.38 (*)    AST 14 (*)    All other components within normal limits  TYPE AND SCREEN     IMAGES:  CXR 11/30/22:  IMPRESSION: 1. Moderate left and small right pleural effusions not significantly changed since last month. 2. No new cardiopulmonary abnormality. Stable gastric hiatal hernia.  CT C/A/P 11/07/22:  IMPRESSION: 1. Small to moderate volume ascites, decreased. 2. Diffuse omental soft tissue caking and thickening and enhancement of the right deep pelvic peritoneum, compatible with peritoneal carcinomatosis, not substantially changed from 08/23/2022 CT. 3. Small to moderate posterior left pleural effusion, increased. Trace posterior right pleural effusion is stable. 4. No lymphadenopathy or additional findings of metastatic disease in the chest, abdomen or pelvis. 5. Chronic findings include: Three-vessel coronary atherosclerosis. Moderate hiatal hernia. Marked left colonic diverticulosis. Small cystocele. Aortic Atherosclerosis (ICD10-I70.0).   EKG:   CV:  Stress test 05/10/2021:    The study is normal. The study is low risk.   No ST deviation was noted.   Left ventricular function is normal. Nuclear stress EF: 64 %. The left ventricular ejection fraction is normal (55-65%). End diastolic cavity size is normal.   Prior study not available for comparison.  Echo 04/28/22:  IMPRESSIONS     1. Left ventricular ejection fraction, by estimation, is 60 to 65%. The  left ventricle has normal function. The left ventricle has no regional  wall motion abnormalities. Left ventricular diastolic  parameters were  normal.   2. Right ventricular systolic function is normal. The right ventricular  size is normal. There is normal pulmonary artery systolic pressure.   3. The mitral valve is normal in structure. Mild mitral valve  regurgitation. No evidence of mitral stenosis.   4. The aortic valve is  tricuspid. Aortic valve regurgitation is not  visualized. No aortic stenosis is present.   5. The inferior vena cava is normal in size with greater than 50%  respiratory variability, suggesting right atrial pressure of 3 mmHg.   Comparison(s): No significant change from prior study.   Conclusion(s)/Recommendation(s): Otherwise normal echocardiogram, with  minor abnormalities described in the report.     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)    Esophageal stricture    Family history of breast cancer    Family history of colon cancer    Family history of melanoma    Family history of prostate cancer    Fibroids 1998   uterine, history of (left ovaries)   GERD (gastroesophageal reflux disease)    History of hiatal hernia    History of uterine prolapse    Hx of adenomatous polyp of colon 10/19/2014   Hyperlipidemia    Hypertension    borderline no meds diet and exercise   Neuromuscular disorder (HCC)    neuropathy from chemo   Obesity    PVC (premature ventricular contraction)    first dx by holter in 1980's, echo (6/10) EF 55-60%, normal diastolic fxn, normal size RV and fxn, mild MR, PASP   Squamous cell carcinoma of arm    ovarian   Tubal pregnancy    Ulcerative proctosigmoiditis (HCC)    Urge urinary incontinence 06/03/2007   Qualifier: Diagnosis of  By: Lindwood Qua CMA, Jerl Santos      Past Surgical History:  Procedure Laterality Date   BLADDER NECK SUSPENSION  1998   BREAST BIOPSY Left    BREAST LUMPECTOMY Left    benign   CATARACT EXTRACTION Right 01/2020   CATARACT EXTRACTION W/PHACO Left 08/12/2020   Procedure: CATARACT EXTRACTION PHACO AND INTRAOCULAR LENS PLACEMENT LEFT EYE;  Surgeon: Fabio Pierce, MD;  Location: [facility];  Service: Ophthalmology;  Laterality: Left;  left CDE=10.71   COLONOSCOPY  05/18/2011/10/12/14   Dr.  Stan Head   ECTOPIC PREGNANCY SURGERY  1980   ESOPHAGOGASTRODUODENOSCOPY (EGD) WITH ESOPHAGEAL DILATION  04/03/2012   Dr. Stan Head   IR IMAGING GUIDED PORT INSERTION  09/06/2022   PARTIAL HYSTERECTOMY     abdominal, fibroids, and prolapse (1998) bladder tack   SKIN CANCER EXCISION     UPPER GASTROINTESTINAL ENDOSCOPY      MEDICATIONS:  apixaban (ELIQUIS) 5 MG TABS tablet   Ascorbic Acid (VITAMIN C) 1000 MG tablet   [START ON 12/11/2022] bisacodyl (DULCOLAX) 5 MG EC tablet   calcium citrate-vitamin D (CITRACAL+D) 315-200 MG-UNIT per tablet   CARBOPLATIN IV   Cholecalciferol (VITAMIN D-3) 5000 UNITS TABS   Coenzyme Q10 (CO Q-10) 200 MG CAPS   CRANBERRY PO   doxycycline (VIBRAMYCIN) 100 MG capsule   Glucosamine-Chondroitin (GLUCOSAMINE CHONDR COMPLEX PO)   GRAPE SEED EXTRACT PO   ketoconazole (NIZORAL) 2 % cream   lansoprazole (PREVACID) 15 MG capsule   LIALDA 1.2 g EC tablet   lidocaine-prilocaine (EMLA) cream   [START ON 12/11/2022] metroNIDAZOLE (FLAGYL) 500  MG tablet   milk thistle 175 MG tablet   Omega-3 Fatty Acids (FISH OIL TRIPLE STRENGTH PO)   ondansetron (ZOFRAN) 8 MG tablet   PACLITAXEL IV   Polyethyl Glycol-Propyl Glycol (SYSTANE OP)   [START ON 12/11/2022] polyethylene glycol powder (MIRALAX) 17 GM/SCOOP powder   rosuvastatin (CRESTOR) 20 MG tablet   traMADol (ULTRAM) 50 MG tablet   traZODone (DESYREL) 50 MG tablet   vitamin B-12 (CYANOCOBALAMIN) 1000 MCG tablet   zinc gluconate 50 MG tablet   No current facility-administered medications for this encounter.

## 2022-12-10 ENCOUNTER — Encounter (HOSPITAL_COMMUNITY): Payer: Self-pay

## 2022-12-10 ENCOUNTER — Inpatient Hospital Stay (HOSPITAL_COMMUNITY)
Admission: RE | Admit: 2022-12-10 | Discharge: 2022-12-10 | Disposition: A | Discharge status: Home / Self Care | Payer: Medicare Other | Source: Ambulatory Visit | Attending: Gynecologic Oncology | Admitting: Gynecologic Oncology

## 2022-12-10 ENCOUNTER — Ambulatory Visit (HOSPITAL_COMMUNITY)
Admission: RE | Admit: 2022-12-10 | Discharge: 2022-12-10 | Disposition: A | Discharge status: Home / Self Care | Payer: Medicare Other | Source: Ambulatory Visit | Attending: Interventional Radiology | Admitting: Interventional Radiology

## 2022-12-10 DIAGNOSIS — R000 Tachycardia, unspecified: Secondary | ICD-10-CM | POA: Diagnosis not present

## 2022-12-10 DIAGNOSIS — Z800 Family history of malignant neoplasm of digestive organs: Secondary | ICD-10-CM | POA: Diagnosis not present

## 2022-12-10 DIAGNOSIS — Z8249 Family history of ischemic heart disease and other diseases of the circulatory system: Secondary | ICD-10-CM | POA: Diagnosis not present

## 2022-12-10 DIAGNOSIS — J90 Pleural effusion, not elsewhere classified: Secondary | ICD-10-CM | POA: Insufficient documentation

## 2022-12-10 DIAGNOSIS — K219 Gastro-esophageal reflux disease without esophagitis: Secondary | ICD-10-CM | POA: Diagnosis not present

## 2022-12-10 DIAGNOSIS — C579 Malignant neoplasm of female genital organ, unspecified: Secondary | ICD-10-CM | POA: Diagnosis not present

## 2022-12-10 DIAGNOSIS — Z961 Presence of intraocular lens: Secondary | ICD-10-CM | POA: Diagnosis not present

## 2022-12-10 DIAGNOSIS — C488 Malignant neoplasm of overlapping sites of retroperitoneum and peritoneum: Secondary | ICD-10-CM | POA: Diagnosis not present

## 2022-12-10 DIAGNOSIS — E871 Hypo-osmolality and hyponatremia: Secondary | ICD-10-CM | POA: Diagnosis not present

## 2022-12-10 DIAGNOSIS — J910 Malignant pleural effusion: Secondary | ICD-10-CM | POA: Diagnosis not present

## 2022-12-10 DIAGNOSIS — K5730 Diverticulosis of large intestine without perforation or abscess without bleeding: Secondary | ICD-10-CM | POA: Diagnosis not present

## 2022-12-10 DIAGNOSIS — N3281 Overactive bladder: Secondary | ICD-10-CM | POA: Diagnosis not present

## 2022-12-10 DIAGNOSIS — I959 Hypotension, unspecified: Secondary | ICD-10-CM | POA: Diagnosis not present

## 2022-12-10 DIAGNOSIS — I2510 Atherosclerotic heart disease of native coronary artery without angina pectoris: Secondary | ICD-10-CM | POA: Diagnosis not present

## 2022-12-10 DIAGNOSIS — E785 Hyperlipidemia, unspecified: Secondary | ICD-10-CM | POA: Diagnosis not present

## 2022-12-10 DIAGNOSIS — Z860101 Personal history of adenomatous and serrated colon polyps: Secondary | ICD-10-CM | POA: Diagnosis not present

## 2022-12-10 DIAGNOSIS — E876 Hypokalemia: Secondary | ICD-10-CM | POA: Diagnosis not present

## 2022-12-10 DIAGNOSIS — Z86718 Personal history of other venous thrombosis and embolism: Secondary | ICD-10-CM | POA: Diagnosis not present

## 2022-12-10 DIAGNOSIS — Z48813 Encounter for surgical aftercare following surgery on the respiratory system: Secondary | ICD-10-CM | POA: Diagnosis not present

## 2022-12-10 DIAGNOSIS — R188 Other ascites: Secondary | ICD-10-CM | POA: Diagnosis not present

## 2022-12-10 DIAGNOSIS — Z85828 Personal history of other malignant neoplasm of skin: Secondary | ICD-10-CM | POA: Diagnosis not present

## 2022-12-10 DIAGNOSIS — K682 Retroperitoneal fibrosis: Secondary | ICD-10-CM | POA: Diagnosis not present

## 2022-12-10 DIAGNOSIS — I700 Atherosclerosis of aorta: Secondary | ICD-10-CM | POA: Diagnosis not present

## 2022-12-10 DIAGNOSIS — Z7901 Long term (current) use of anticoagulants: Secondary | ICD-10-CM | POA: Diagnosis not present

## 2022-12-10 DIAGNOSIS — C384 Malignant neoplasm of pleura: Secondary | ICD-10-CM | POA: Insufficient documentation

## 2022-12-10 DIAGNOSIS — C801 Malignant (primary) neoplasm, unspecified: Secondary | ICD-10-CM | POA: Diagnosis not present

## 2022-12-10 DIAGNOSIS — Z808 Family history of malignant neoplasm of other organs or systems: Secondary | ICD-10-CM | POA: Diagnosis not present

## 2022-12-10 DIAGNOSIS — R140 Abdominal distension (gaseous): Secondary | ICD-10-CM | POA: Diagnosis not present

## 2022-12-10 DIAGNOSIS — H9201 Otalgia, right ear: Secondary | ICD-10-CM | POA: Diagnosis not present

## 2022-12-10 MED ORDER — LIDOCAINE HCL (PF) 2 % IJ SOLN
10.0000 mL | Freq: Once | INTRAMUSCULAR | Status: AC
  Administered 2022-12-10: 10 mL

## 2022-12-10 MED ORDER — LIDOCAINE HCL (PF) 2 % IJ SOLN
INTRAMUSCULAR | Status: AC
  Filled 2022-12-10: qty 40

## 2022-12-10 NOTE — Procedures (Signed)
Pre procedural Dx: Symptomatic pleural effusion Post procedural Dx: Same  Successful US guided left sided thoracentesis yielding 1.4 L of serous pleural fluid.   Samples sent to lab for analysis.  EBL: None Complications: None immediate.  Ronny Bacon, MD Pager #: [phone]

## 2022-12-10 NOTE — Anesthesia Preprocedure Evaluation (Signed)
Anesthesia Evaluation    Airway        Dental   Pulmonary           Cardiovascular hypertension,      Neuro/Psych    GI/Hepatic   Endo/Other    Renal/GU      Musculoskeletal   Abdominal   Peds  Hematology   Anesthesia Other Findings   Reproductive/Obstetrics                              Anesthesia Physical Anesthesia Plan  ASA:   Anesthesia Plan:    Post-op Pain Management:    Induction:   PONV Risk Score and Plan:   Airway Management Planned:   Additional Equipment:   Intra-op Plan:   Post-operative Plan:   Informed Consent:   Plan Discussed with:   Anesthesia Plan Comments: (See PAT note from 11/7 by Sherlie Ban PA-C )         Anesthesia Quick Evaluation  12/06/2022           eliquis   Anesthesia Other Findings   Reproductive/Obstetrics                              Anesthesia Physical Anesthesia Plan  ASA: 3  Anesthesia Plan: General   Post-op Pain Management: Tylenol PO (pre-op)*   Induction: Intravenous  PONV Risk Score and Plan: 4 or greater and Ondansetron, Dexamethasone and Propofol infusion  Airway Management Planned: Oral ETT  Additional Equipment: None  Intra-op Plan:   Post-operative Plan: Extubation in OR  Informed Consent: I have reviewed the patients History and Physical, chart, labs and discussed the procedure including the risks, benefits and alternatives for the proposed anesthesia with the patient or authorized representative who has indicated his/her understanding and acceptance.     Dental advisory given  Plan Discussed with: CRNA  Anesthesia Plan Comments: (See PAT note from 11/7 by Sherlie Ban PA-C )         Anesthesia Quick Evaluation

## 2022-12-10 NOTE — Progress Notes (Signed)
Dr. Grace Isaac evaluated patient multiple times after thoracentesis procedure was completed.

## 2022-12-10 NOTE — Progress Notes (Signed)
Left sided thoracentesis complete and 1.4 Liters of red colored pleural fluid removed with labs collected and sent for processing. Patient became diaphoretic and experienced shortness of breath post procedure and held an extra 40 minutes to monitor vital signs. Patient at 0940 ambulated down the hallway on room air and expressed to Dr. Grace Isaac she felt better and ready to be discharged. Patient verbalized understanding of discharge instructions and ambulatory at [phone] with no acute distress noted.

## 2022-12-11 ENCOUNTER — Encounter: Payer: Self-pay | Admitting: Obstetrics

## 2022-12-11 ENCOUNTER — Telehealth: Payer: Self-pay | Admitting: *Deleted

## 2022-12-11 ENCOUNTER — Ambulatory Visit (INDEPENDENT_AMBULATORY_CARE_PROVIDER_SITE_OTHER): Payer: Medicare Other | Admitting: Obstetrics

## 2022-12-11 VITALS — BP 131/81 | HR 81

## 2022-12-11 DIAGNOSIS — N3281 Overactive bladder: Secondary | ICD-10-CM

## 2022-12-11 DIAGNOSIS — R350 Frequency of micturition: Secondary | ICD-10-CM

## 2022-12-11 DIAGNOSIS — R102 Pelvic and perineal pain: Secondary | ICD-10-CM

## 2022-12-11 LAB — POCT URINALYSIS DIPSTICK
Bilirubin, UA: NEGATIVE
Blood, UA: NEGATIVE
Glucose, UA: NEGATIVE
Ketones, UA: NEGATIVE
Leukocytes, UA: NEGATIVE
Nitrite, UA: NEGATIVE
Protein, UA: NEGATIVE
Spec Grav, UA: >=1.03 — AB (ref 1.010–1.025)
Urobilinogen, UA: 0.2 U/dL
pH, UA: 7.5 (ref 5.0–8.0)

## 2022-12-11 LAB — CYTOLOGY - NON PAP

## 2022-12-11 MED ORDER — LIDOCAINE HCL URETHRAL/MUCOSAL 2 % EX GEL
1.0000 | Freq: Once | CUTANEOUS | Status: AC
  Administered 2022-12-11: 1 via URETHRAL

## 2022-12-11 MED ORDER — SULFAMETHOXAZOLE-TRIMETHOPRIM 800-160 MG PO TABS: 1.0000 | ORAL_TABLET | Freq: Once | ORAL | 0 refills | Status: AC

## 2022-12-11 MED ORDER — ONABOTULINUMTOXINA 100 UNITS IJ SOLR: 200.0000 [IU] | Freq: Once | INTRAMUSCULAR | Status: DC

## 2022-12-11 MED ORDER — LIDOCAINE HCL 2 % IJ SOLN
40.0000 mL | Freq: Once | INTRAMUSCULAR | Status: AC
  Administered 2022-12-11: 800 mg

## 2022-12-11 MED ORDER — PHENAZOPYRIDINE HCL 200 MG PO TABS
200.0000 mg | ORAL_TABLET | Freq: Three times a day (TID) | ORAL | 0 refills | Status: DC | PRN
Start: 2022-12-11 — End: 2023-04-26

## 2022-12-11 NOTE — Progress Notes (Signed)
Intravesical Botox Procedure:  77 y.o. yo F with OAB presenting today for intravesical botox.   Last botox injection 150U was 05/28/22, reports return of urinary urgency that returned 4 weeks ago Held Eliquis for pending diagnostic laparoscopy, possible open BSO, omentectomy, and possible debulking tomorrow for papillary serous carcinoma with peritoneal carcinomatosis.  Started bowel prep. Reports taking sulfa drugs in the past without issues.   Vitals:   12/11/22 1445  BP: 131/81  Pulse: 81   Results for orders placed or performed in visit on 12/11/22 (from the past 48 hour(s))  POCT Urinalysis Dipstick     Status: Abnormal   Collection Time: 12/11/22  3:27 PM  Result Value Ref Range   Color, UA Yellow    Clarity, UA Clear    Glucose, UA Negative Negative   Bilirubin, UA Negative    Ketones, UA Negative    Spec Grav, UA >=1.030 (A) 1.010 - 1.025   Blood, UA Negative    pH, UA 7.5 5.0 - 8.0   Protein, UA Negative Negative   Urobilinogen, UA 0.2 0.2 or 1.0 E.U./dL   Nitrite, UA Negative    Leukocytes, UA Negative Negative   Appearance     Odor      Prior to the procedure, the patient took Bactrim for antibiotic prophylaxis in the office. Time out was performed. The bladder was catherized and 50 ml of 2% lidocaine was placed in the bladder and 10 ml of 2% lidocaine jelly placed in the urethra. After 30 minutes the lidocaine was drained. Cystoscopy was performed with sterile H2O and a 70 degree scope. Bladder mucosa was noted to be within normal limits. A total of 15 ml / 150 units of Botox A,  Lot # [id number]  Exp 03/2025  was injected in the detrusor muscle via 5 injections of 3ml each. These were spaced about 1 cm apart. Care was taken to avoid the ureteral orifices. Patient tolerated the procedure well.  Impression: 77 y.o. 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 Patient tolerated procedure well.  Plan: Post-procedure instructions given regarding  bleeding, infection, urinary retention.  Self-catheterization teaching was previously performed.   - Allergy to sulfasalazine with fever, chills, muscle ache and rash. However reports prior sulfa use without complications. Advised pt to take benadryl and seek urgent evaluation if she develops any difficulty swallowing or breathing.   Patient will follow up in 4 weeks All questions answered.  Loleta Chance, MD

## 2022-12-11 NOTE — Telephone Encounter (Addendum)
Telephone call to check on pre-operative status.  Patient compliant with pre-operative instructions.  Reinforced bowel prep instructions. Clear liquids until 0930. Patient to arrive at 1030.  No questions or concerns voiced.  Instructed to call for any needs.

## 2022-12-11 NOTE — Patient Instructions (Addendum)
Taking Care of Yourself after Urodynamics, Cystoscopy, Bulkamid Injection, or Botox Injection   Drink plenty of water for a day or two following your procedure. Try to have about 8 ounces (one cup) at a time, and do this 6 times or more per day unless you have fluid restrictitons AVOID irritative beverages such as coffee, tea, soda, alcoholic or citrus drinks for a day or two, as this may cause burning with urination.  For the first 1-2 days after the procedure, your urine may be pink or red in color. You may have some blood in your urine as a normal side effect of the procedure. Large amounts of bleeding or difficulty urinating are NOT normal. Call the nurse line if this happens or go to the nearest Emergency Room if the bleeding is heavy or you cannot urinate at all and it is after hours. If you had a Bulkamid injection in the urethra and need to be catheterized, ask for a pediatric catheter to be used (size 10 or 12-French) so the material is not pushed out of place.   You may experience some discomfort or a burning sensation with urination after having this procedure. You can use over the counter Azo or pyridium to help with burning and follow the instructions on the packaging. If it does not improve within 1-2 days, or other symptoms appear (fever, chills, or difficulty urinating) call the office to speak to a nurse.  You may return to normal daily activities such as work, school, driving, exercising and housework on the day of the procedure. If your doctor gave you a prescription, take it as ordered.   Start pyridium by mouth with food up to 3 times a day for burning with urination

## 2022-12-12 ENCOUNTER — Inpatient Hospital Stay (HOSPITAL_COMMUNITY): Payer: Medicare Other

## 2022-12-12 ENCOUNTER — Encounter (HOSPITAL_COMMUNITY): Payer: Self-pay | Admitting: Gynecologic Oncology

## 2022-12-12 ENCOUNTER — Inpatient Hospital Stay (HOSPITAL_COMMUNITY)
Admission: RE | Admit: 2022-12-12 | Discharge: 2022-12-21 | DRG: 329 | Disposition: A | Discharge status: Home Health | Payer: Medicare Other | Attending: Gynecologic Oncology | Admitting: Gynecologic Oncology

## 2022-12-12 ENCOUNTER — Other Ambulatory Visit: Payer: Self-pay

## 2022-12-12 ENCOUNTER — Encounter (HOSPITAL_COMMUNITY)
Admission: RE | Disposition: A | Discharge status: Home / Self Care | Payer: Self-pay | Source: Home / Self Care | Attending: Gynecologic Oncology

## 2022-12-12 ENCOUNTER — Inpatient Hospital Stay (HOSPITAL_COMMUNITY): Payer: Medicare Other | Admitting: Physician Assistant

## 2022-12-12 DIAGNOSIS — Z8249 Family history of ischemic heart disease and other diseases of the circulatory system: Secondary | ICD-10-CM | POA: Diagnosis not present

## 2022-12-12 DIAGNOSIS — Z800 Family history of malignant neoplasm of digestive organs: Secondary | ICD-10-CM | POA: Diagnosis not present

## 2022-12-12 DIAGNOSIS — R000 Tachycardia, unspecified: Secondary | ICD-10-CM | POA: Diagnosis not present

## 2022-12-12 DIAGNOSIS — K5730 Diverticulosis of large intestine without perforation or abscess without bleeding: Secondary | ICD-10-CM | POA: Diagnosis present

## 2022-12-12 DIAGNOSIS — Z6831 Body mass index (BMI) 31.0-31.9, adult: Secondary | ICD-10-CM

## 2022-12-12 DIAGNOSIS — Z8042 Family history of malignant neoplasm of prostate: Secondary | ICD-10-CM

## 2022-12-12 DIAGNOSIS — C785 Secondary malignant neoplasm of large intestine and rectum: Secondary | ICD-10-CM | POA: Diagnosis not present

## 2022-12-12 DIAGNOSIS — I959 Hypotension, unspecified: Secondary | ICD-10-CM | POA: Diagnosis not present

## 2022-12-12 DIAGNOSIS — I700 Atherosclerosis of aorta: Secondary | ICD-10-CM | POA: Diagnosis present

## 2022-12-12 DIAGNOSIS — R198 Other specified symptoms and signs involving the digestive system and abdomen: Secondary | ICD-10-CM

## 2022-12-12 DIAGNOSIS — C482 Malignant neoplasm of peritoneum, unspecified: Secondary | ICD-10-CM | POA: Diagnosis not present

## 2022-12-12 DIAGNOSIS — I2510 Atherosclerotic heart disease of native coronary artery without angina pectoris: Secondary | ICD-10-CM | POA: Diagnosis present

## 2022-12-12 DIAGNOSIS — R188 Other ascites: Secondary | ICD-10-CM | POA: Diagnosis present

## 2022-12-12 DIAGNOSIS — C800 Disseminated malignant neoplasm, unspecified: Principal | ICD-10-CM

## 2022-12-12 DIAGNOSIS — E876 Hypokalemia: Secondary | ICD-10-CM | POA: Diagnosis not present

## 2022-12-12 DIAGNOSIS — C801 Malignant (primary) neoplasm, unspecified: Secondary | ICD-10-CM | POA: Diagnosis not present

## 2022-12-12 DIAGNOSIS — C772 Secondary and unspecified malignant neoplasm of intra-abdominal lymph nodes: Secondary | ICD-10-CM | POA: Diagnosis not present

## 2022-12-12 DIAGNOSIS — Z808 Family history of malignant neoplasm of other organs or systems: Secondary | ICD-10-CM

## 2022-12-12 DIAGNOSIS — H9201 Otalgia, right ear: Secondary | ICD-10-CM | POA: Diagnosis present

## 2022-12-12 DIAGNOSIS — Z86718 Personal history of other venous thrombosis and embolism: Secondary | ICD-10-CM | POA: Diagnosis not present

## 2022-12-12 DIAGNOSIS — Z85828 Personal history of other malignant neoplasm of skin: Secondary | ICD-10-CM | POA: Diagnosis not present

## 2022-12-12 DIAGNOSIS — Z803 Family history of malignant neoplasm of breast: Secondary | ICD-10-CM

## 2022-12-12 DIAGNOSIS — E785 Hyperlipidemia, unspecified: Secondary | ICD-10-CM | POA: Diagnosis present

## 2022-12-12 DIAGNOSIS — E669 Obesity, unspecified: Secondary | ICD-10-CM | POA: Diagnosis present

## 2022-12-12 DIAGNOSIS — G8918 Other acute postprocedural pain: Secondary | ICD-10-CM

## 2022-12-12 DIAGNOSIS — R109 Unspecified abdominal pain: Secondary | ICD-10-CM

## 2022-12-12 DIAGNOSIS — E871 Hypo-osmolality and hyponatremia: Secondary | ICD-10-CM | POA: Diagnosis not present

## 2022-12-12 DIAGNOSIS — Z83719 Family history of colon polyps, unspecified: Secondary | ICD-10-CM

## 2022-12-12 DIAGNOSIS — K682 Retroperitoneal fibrosis: Secondary | ICD-10-CM | POA: Diagnosis present

## 2022-12-12 DIAGNOSIS — R0989 Other specified symptoms and signs involving the circulatory and respiratory systems: Secondary | ICD-10-CM

## 2022-12-12 DIAGNOSIS — J90 Pleural effusion, not elsewhere classified: Secondary | ICD-10-CM | POA: Diagnosis present

## 2022-12-12 DIAGNOSIS — Z90711 Acquired absence of uterus with remaining cervical stump: Secondary | ICD-10-CM

## 2022-12-12 DIAGNOSIS — C488 Malignant neoplasm of overlapping sites of retroperitoneum and peritoneum: Principal | ICD-10-CM | POA: Diagnosis present

## 2022-12-12 DIAGNOSIS — Z860101 Personal history of adenomatous and serrated colon polyps: Secondary | ICD-10-CM

## 2022-12-12 DIAGNOSIS — C579 Malignant neoplasm of female genital organ, unspecified: Secondary | ICD-10-CM | POA: Diagnosis present

## 2022-12-12 DIAGNOSIS — K219 Gastro-esophageal reflux disease without esophagitis: Secondary | ICD-10-CM | POA: Diagnosis present

## 2022-12-12 DIAGNOSIS — C784 Secondary malignant neoplasm of small intestine: Secondary | ICD-10-CM | POA: Diagnosis not present

## 2022-12-12 DIAGNOSIS — Z9221 Personal history of antineoplastic chemotherapy: Secondary | ICD-10-CM

## 2022-12-12 DIAGNOSIS — Z7901 Long term (current) use of anticoagulants: Secondary | ICD-10-CM

## 2022-12-12 DIAGNOSIS — R110 Nausea: Secondary | ICD-10-CM

## 2022-12-12 DIAGNOSIS — Z961 Presence of intraocular lens: Secondary | ICD-10-CM | POA: Diagnosis present

## 2022-12-12 DIAGNOSIS — I10 Essential (primary) hypertension: Secondary | ICD-10-CM | POA: Diagnosis not present

## 2022-12-12 DIAGNOSIS — Z79899 Other long term (current) drug therapy: Secondary | ICD-10-CM

## 2022-12-12 DIAGNOSIS — R34 Anuria and oliguria: Secondary | ICD-10-CM

## 2022-12-12 HISTORY — PX: BILATERAL SALPINGECTOMY: SHX5743

## 2022-12-12 HISTORY — PX: OMENTECTOMY: SHX5985

## 2022-12-12 HISTORY — PX: SALPINGOOPHORECTOMY: SHX82

## 2022-12-12 HISTORY — PX: DEBULKING: SHX6277

## 2022-12-12 LAB — BASIC METABOLIC PANEL
Anion gap: 10 (ref 5–15)
BUN: 9 mg/dL (ref 8–23)
CO2: 20 mmol/L — ABNORMAL LOW (ref 22–32)
Calcium: 8.5 mg/dL — ABNORMAL LOW (ref 8.9–10.3)
Chloride: 107 mmol/L (ref 98–111)
Creatinine, Ser: 0.59 mg/dL (ref 0.44–1.00)
GFR, Estimated: >60 mL/min (ref >60)
Glucose, Bld: 177 mg/dL — ABNORMAL HIGH (ref 70–99)
Potassium: 3.6 mmol/L (ref 3.5–5.1)
Sodium: 137 mmol/L (ref 135–145)

## 2022-12-12 LAB — CBC
HCT: 38.5 % (ref 36.0–46.0)
Hemoglobin: 12.4 g/dL (ref 12.0–15.0)
MCH: 31 pg (ref 26.0–34.0)
MCHC: 32.2 g/dL (ref 30.0–36.0)
MCV: 96.3 fL (ref 80.0–100.0)
Platelets: 155 10*3/uL (ref 150–400)
RBC: 4 MIL/uL (ref 3.87–5.11)
RDW: 17.8 % — ABNORMAL HIGH (ref 11.5–15.5)
WBC: 10.7 10*3/uL — ABNORMAL HIGH (ref 4.0–10.5)
nRBC: 0 % (ref 0.0–0.2)

## 2022-12-12 LAB — TYPE AND SCREEN
ABO/RH(D): A POS
Antibody Screen: NEGATIVE

## 2022-12-12 LAB — MRSA NEXT GEN BY PCR, NASAL: MRSA by PCR Next Gen: NOT DETECTED

## 2022-12-12 LAB — ABO/RH: ABO/RH(D): A POS

## 2022-12-12 SURGERY — SALPINGO-OOPHORECTOMY, OPEN
Anesthesia: General

## 2022-12-12 MED ORDER — ROCURONIUM BROMIDE 100 MG/10ML IV SOLN
INTRAVENOUS | Status: DC | PRN
  Administered 2022-12-12: 50 mg via INTRAVENOUS
  Administered 2022-12-12: 10 mg via INTRAVENOUS
  Administered 2022-12-12 (×2): 20 mg via INTRAVENOUS

## 2022-12-12 MED ORDER — ACETAMINOPHEN 500 MG PO TABS: 1000.0000 mg | ORAL_TABLET | Freq: Once | ORAL | Status: DC | PRN

## 2022-12-12 MED ORDER — METRONIDAZOLE 500 MG/100ML IV SOLN
INTRAVENOUS | Status: AC
  Filled 2022-12-12: qty 100

## 2022-12-12 MED ORDER — FENTANYL CITRATE PF 50 MCG/ML IJ SOSY: 25.0000 ug | PREFILLED_SYRINGE | INTRAMUSCULAR | Status: DC | PRN

## 2022-12-12 MED ORDER — SODIUM CHLORIDE 0.9 % IV SOLN
2.0000 g | INTRAVENOUS | Status: DC
  Filled 2022-12-12: qty 2

## 2022-12-12 MED ORDER — ACETAMINOPHEN 10 MG/ML IV SOLN
INTRAVENOUS | Status: AC
  Filled 2022-12-12: qty 100

## 2022-12-12 MED ORDER — HYDROMORPHONE HCL 1 MG/ML IJ SOLN
INTRAMUSCULAR | Status: DC | PRN
  Administered 2022-12-12: 1 mg via INTRAVENOUS

## 2022-12-12 MED ORDER — OXYCODONE HCL 5 MG PO TABS: 5.0000 mg | ORAL_TABLET | Freq: Once | ORAL | Status: DC | PRN

## 2022-12-12 MED ORDER — ENSURE PRE-SURGERY PO LIQD: 592.0000 mL | Freq: Once | ORAL | Status: DC

## 2022-12-12 MED ORDER — LIDOCAINE HCL (PF) 2 % IJ SOLN
INTRAMUSCULAR | Status: DC | PRN
  Administered 2022-12-12: 1.5 mg/kg/h via INTRADERMAL

## 2022-12-12 MED ORDER — SODIUM CHLORIDE 0.9% FLUSH: 10.0000 mL | INTRAVENOUS | Status: DC | PRN

## 2022-12-12 MED ORDER — PANTOPRAZOLE SODIUM 40 MG IV SOLR
40.0000 mg | Freq: Every day | INTRAVENOUS | Status: DC
Start: 2022-12-12 — End: 2022-12-21
  Administered 2022-12-12 – 2022-12-20 (×9): 40 mg via INTRAVENOUS
  Filled 2022-12-12 (×10): qty 10

## 2022-12-12 MED ORDER — ORAL CARE MOUTH RINSE: 15.0000 mL | Freq: Once | OROMUCOSAL | Status: AC

## 2022-12-12 MED ORDER — ONDANSETRON HCL 4 MG PO TABS
4.0000 mg | ORAL_TABLET | Freq: Four times a day (QID) | ORAL | Status: DC | PRN
Start: 2022-12-12 — End: 2022-12-21

## 2022-12-12 MED ORDER — POVIDONE-IODINE 10 % EX SWAB: 2.0000 | Freq: Once | CUTANEOUS | Status: DC

## 2022-12-12 MED ORDER — ALBUMIN HUMAN 5 % IV SOLN
INTRAVENOUS | Status: AC
  Filled 2022-12-12: qty 250

## 2022-12-12 MED ORDER — DEXAMETHASONE SODIUM PHOSPHATE 10 MG/ML IJ SOLN
INTRAMUSCULAR | Status: AC
  Filled 2022-12-12: qty 1

## 2022-12-12 MED ORDER — INFLUENZA VAC A&B SURF ANT ADJ 0.5 ML IM SUSY
0.5000 mL | PREFILLED_SYRINGE | INTRAMUSCULAR | Status: DC
  Filled 2022-12-12: qty 0.5

## 2022-12-12 MED ORDER — PROPOFOL 10 MG/ML IV BOLUS
INTRAVENOUS | Status: AC
  Filled 2022-12-12: qty 20

## 2022-12-12 MED ORDER — BUPIVACAINE LIPOSOME 1.3 % IJ SUSP
INTRAMUSCULAR | Status: DC | PRN
  Administered 2022-12-12: 20 mL

## 2022-12-12 MED ORDER — PHENYLEPHRINE HCL-NACL 20-0.9 MG/250ML-% IV SOLN
0.0000 ug/min | INTRAVENOUS | Status: DC
Start: 2022-12-12 — End: 2022-12-12
  Administered 2022-12-12: 20 ug/min via INTRAVENOUS

## 2022-12-12 MED ORDER — ONDANSETRON HCL 4 MG/2ML IJ SOLN
4.0000 mg | Freq: Four times a day (QID) | INTRAMUSCULAR | Status: DC | PRN
  Administered 2022-12-12 – 2022-12-18 (×8): 4 mg via INTRAVENOUS
  Filled 2022-12-12 (×9): qty 2

## 2022-12-12 MED ORDER — ORAL CARE MOUTH RINSE: 15.0000 mL | OROMUCOSAL | Status: DC | PRN

## 2022-12-12 MED ORDER — LIDOCAINE HCL 2 % IJ SOLN
INTRAMUSCULAR | Status: AC
  Filled 2022-12-12: qty 20

## 2022-12-12 MED ORDER — LACTATED RINGERS IV SOLN: INTRAVENOUS | Status: DC | PRN

## 2022-12-12 MED ORDER — FENTANYL CITRATE (PF) 100 MCG/2ML IJ SOLN
INTRAMUSCULAR | Status: AC
  Filled 2022-12-12: qty 2

## 2022-12-12 MED ORDER — ENOXAPARIN SODIUM 40 MG/0.4ML IJ SOSY: 40.0000 mg | PREFILLED_SYRINGE | INTRAMUSCULAR | Status: DC

## 2022-12-12 MED ORDER — ONDANSETRON HCL 4 MG/2ML IJ SOLN
INTRAMUSCULAR | Status: DC | PRN
  Administered 2022-12-12: 4 mg via INTRAVENOUS

## 2022-12-12 MED ORDER — ACETAMINOPHEN 10 MG/ML IV SOLN
1000.0000 mg | Freq: Once | INTRAVENOUS | Status: DC | PRN
Start: 2022-12-12 — End: 2022-12-12
  Administered 2022-12-12: 1000 mg via INTRAVENOUS

## 2022-12-12 MED ORDER — ONDANSETRON HCL 4 MG/2ML IJ SOLN
INTRAMUSCULAR | Status: AC
  Filled 2022-12-12: qty 2

## 2022-12-12 MED ORDER — HEMOSTATIC AGENTS (NO CHARGE) OPTIME
TOPICAL | Status: DC | PRN
  Administered 2022-12-12: 1 via TOPICAL

## 2022-12-12 MED ORDER — BUPIVACAINE HCL 0.25 % IJ SOLN
INTRAMUSCULAR | Status: AC
  Filled 2022-12-12: qty 1

## 2022-12-12 MED ORDER — DEXMEDETOMIDINE HCL IN NACL 80 MCG/20ML IV SOLN
INTRAVENOUS | Status: AC
Start: 2022-12-12 — End: ?
  Filled 2022-12-12: qty 20

## 2022-12-12 MED ORDER — DEXAMETHASONE SODIUM PHOSPHATE 4 MG/ML IJ SOLN
4.0000 mg | INTRAMUSCULAR | Status: AC
Start: 2022-12-12 — End: 2022-12-12
  Administered 2022-12-12: 4 mg via INTRAVENOUS

## 2022-12-12 MED ORDER — ACETAMINOPHEN 160 MG/5ML PO SOLN: 1000.0000 mg | Freq: Once | ORAL | Status: DC | PRN

## 2022-12-12 MED ORDER — CHLORHEXIDINE GLUCONATE CLOTH 2 % EX PADS
6.0000 | MEDICATED_PAD | Freq: Every day | CUTANEOUS | Status: DC
  Administered 2022-12-12 – 2022-12-21 (×10): 6 via TOPICAL

## 2022-12-12 MED ORDER — HEPARIN SODIUM (PORCINE) 5000 UNIT/ML IJ SOLN
5000.0000 [IU] | INTRAMUSCULAR | Status: AC
  Administered 2022-12-12: 5000 [IU] via SUBCUTANEOUS
  Filled 2022-12-12: qty 1

## 2022-12-12 MED ORDER — HYDROMORPHONE HCL 1 MG/ML IJ SOLN
0.5000 mg | INTRAMUSCULAR | Status: DC | PRN
  Administered 2022-12-12 – 2022-12-14 (×7): 1 mg via INTRAVENOUS
  Filled 2022-12-12 (×7): qty 1

## 2022-12-12 MED ORDER — SODIUM CHLORIDE 0.9% FLUSH
10.0000 mL | Freq: Two times a day (BID) | INTRAVENOUS | Status: DC
  Administered 2022-12-12: 10 mL
  Administered 2022-12-13: 20 mL
  Administered 2022-12-13 – 2022-12-20 (×12): 10 mL

## 2022-12-12 MED ORDER — OXYCODONE HCL 5 MG/5ML PO SOLN
5.0000 mg | Freq: Once | ORAL | Status: DC | PRN
Start: 2022-12-12 — End: 2022-12-12

## 2022-12-12 MED ORDER — LACTATED RINGERS IV SOLN: INTRAVENOUS | Status: DC

## 2022-12-12 MED ORDER — FENTANYL CITRATE (PF) 100 MCG/2ML IJ SOLN
INTRAMUSCULAR | Status: DC | PRN
  Administered 2022-12-12 (×2): 50 ug via INTRAVENOUS

## 2022-12-12 MED ORDER — METRONIDAZOLE 500 MG/100ML IV SOLN
INTRAVENOUS | Status: DC | PRN
  Administered 2022-12-12: 500 mg via INTRAVENOUS

## 2022-12-12 MED ORDER — PHENYLEPHRINE HCL-NACL 20-0.9 MG/250ML-% IV SOLN
INTRAVENOUS | Status: DC | PRN
  Administered 2022-12-12 (×2): 80 ug via INTRAVENOUS
  Administered 2022-12-12 (×2): 160 ug via INTRAVENOUS
  Administered 2022-12-12: 80 ug via INTRAVENOUS
  Administered 2022-12-12: 160 ug via INTRAVENOUS
  Administered 2022-12-12: 240 ug via INTRAVENOUS
  Administered 2022-12-12: 80 ug via INTRAVENOUS
  Administered 2022-12-12: 50 ug/min via INTRAVENOUS

## 2022-12-12 MED ORDER — HYDROMORPHONE HCL 2 MG/ML IJ SOLN
INTRAMUSCULAR | Status: AC
  Filled 2022-12-12: qty 1

## 2022-12-12 MED ORDER — PROPOFOL 500 MG/50ML IV EMUL
INTRAVENOUS | Status: AC
  Filled 2022-12-12: qty 50

## 2022-12-12 MED ORDER — ALBUMIN HUMAN 5 % IV SOLN: INTRAVENOUS | Status: DC | PRN

## 2022-12-12 MED ORDER — LIDOCAINE HCL (CARDIAC) PF 100 MG/5ML IV SOSY
PREFILLED_SYRINGE | INTRAVENOUS | Status: DC | PRN
  Administered 2022-12-12: 60 mg via INTRAVENOUS

## 2022-12-12 MED ORDER — POLYVINYL ALCOHOL 1.4 % OP SOLN
1.0000 [drp] | Freq: Every day | OPHTHALMIC | Status: DC | PRN
Start: 2022-12-12 — End: 2022-12-21

## 2022-12-12 MED ORDER — LIDOCAINE HCL (PF) 2 % IJ SOLN
INTRAMUSCULAR | Status: AC
  Filled 2022-12-12: qty 5

## 2022-12-12 MED ORDER — ORAL CARE MOUTH RINSE
15.0000 mL | OROMUCOSAL | Status: DC
  Administered 2022-12-12 – 2022-12-14 (×6): 15 mL via OROMUCOSAL

## 2022-12-12 MED ORDER — CHLORHEXIDINE GLUCONATE 0.12 % MT SOLN
15.0000 mL | Freq: Once | OROMUCOSAL | Status: AC
  Administered 2022-12-12: 15 mL via OROMUCOSAL

## 2022-12-12 MED ORDER — PROPOFOL 1000 MG/100ML IV EMUL
INTRAVENOUS | Status: AC
Start: 2022-12-12 — End: ?
  Filled 2022-12-12: qty 100

## 2022-12-12 MED ORDER — PROPOFOL 10 MG/ML IV BOLUS
INTRAVENOUS | Status: DC | PRN
  Administered 2022-12-12: 60 mg via INTRAVENOUS
  Administered 2022-12-12: 100 mg via INTRAVENOUS
  Administered 2022-12-12: 40 mg via INTRAVENOUS

## 2022-12-12 MED ORDER — ROCURONIUM BROMIDE 10 MG/ML (PF) SYRINGE
PREFILLED_SYRINGE | INTRAVENOUS | Status: AC
  Filled 2022-12-12: qty 10

## 2022-12-12 MED ORDER — ACETAMINOPHEN 500 MG PO TABS
1000.0000 mg | ORAL_TABLET | ORAL | Status: AC
Start: 2022-12-12 — End: 2022-12-12
  Administered 2022-12-12: 1000 mg via ORAL
  Filled 2022-12-12: qty 2

## 2022-12-12 MED ORDER — BUPIVACAINE LIPOSOME 1.3 % IJ SUSP
INTRAMUSCULAR | Status: AC
  Filled 2022-12-12: qty 20

## 2022-12-12 MED ORDER — PROPOFOL 500 MG/50ML IV EMUL
INTRAVENOUS | Status: DC | PRN
  Administered 2022-12-12: 50 ug/kg/min via INTRAVENOUS

## 2022-12-12 MED ORDER — KCL IN DEXTROSE-NACL 20-5-0.45 MEQ/L-%-% IV SOLN
INTRAVENOUS | Status: DC
  Filled 2022-12-12 (×7): qty 1000

## 2022-12-12 MED ORDER — SODIUM CHLORIDE 0.9 % IV SOLN
2.0000 g | INTRAVENOUS | Status: AC
  Administered 2022-12-12 (×2): 2 g via INTRAVENOUS
  Filled 2022-12-12: qty 2

## 2022-12-12 MED ORDER — BUPIVACAINE HCL 0.25 % IJ SOLN
INTRAMUSCULAR | Status: DC | PRN
  Administered 2022-12-12: 10 mL

## 2022-12-12 MED ORDER — ENSURE PRE-SURGERY PO LIQD: 296.0000 mL | Freq: Once | ORAL | Status: DC

## 2022-12-12 MED ORDER — 0.9 % SODIUM CHLORIDE (POUR BTL) OPTIME
TOPICAL | Status: DC | PRN
  Administered 2022-12-12: 4000 mL

## 2022-12-12 SURGICAL SUPPLY — 86 items
ATTRACTOMAT 16X20 MAGNETIC DRP (DRAPES) IMPLANT
BAG COUNTER SPONGE SURGICOUNT (BAG) IMPLANT
BLADE EXTENDED COATED 6.5IN (ELECTRODE) ×3 IMPLANT
CELLS DAT CNTRL 66122 CELL SVR (MISCELLANEOUS) IMPLANT
CHLORAPREP W/TINT 26 (MISCELLANEOUS) ×3 IMPLANT
CLIP TI LARGE 6 (CLIP) ×3 IMPLANT
CLIP TI MEDIUM 6 (CLIP) ×3 IMPLANT
CLIP TI MEDIUM LARGE 6 (CLIP) ×3 IMPLANT
CNTNR URN SCR LID CUP LEK RST (MISCELLANEOUS) IMPLANT
CONT SPEC 4OZ STRL OR WHT (MISCELLANEOUS)
DERMABOND ADVANCED .7 DNX12 (GAUZE/BANDAGES/DRESSINGS) IMPLANT
DRAIN CHANNEL RND F F (WOUND CARE) IMPLANT
DRAPE INCISE IOBAN 66X45 STRL (DRAPES) IMPLANT
DRAPE SHEET LG 3/4 BI-LAMINATE (DRAPES) IMPLANT
DRAPE SURG IRRIG POUCH 19X23 (DRAPES) ×3 IMPLANT
DRAPE WARM FLUID 44X44 (DRAPES) ×3 IMPLANT
DRSG OPSITE POSTOP 4X10 (GAUZE/BANDAGES/DRESSINGS) IMPLANT
DRSG OPSITE POSTOP 4X6 (GAUZE/BANDAGES/DRESSINGS) IMPLANT
DRSG OPSITE POSTOP 4X8 (GAUZE/BANDAGES/DRESSINGS) IMPLANT
DRSG TEGADERM 4X4.75 (GAUZE/BANDAGES/DRESSINGS) IMPLANT
ELECT BLADE 6.5 EXT (BLADE) IMPLANT
ELECT REM PT RETURN 15FT ADLT (MISCELLANEOUS) ×3 IMPLANT
EVACUATOR SILICONE 100CC (DRAIN) IMPLANT
GAUZE 4X4 16PLY ~~LOC~~+RFID DBL (SPONGE) ×3 IMPLANT
GAUZE SPONGE 2X2 8PLY STRL LF (GAUZE/BANDAGES/DRESSINGS) IMPLANT
GLOVE BIO SURGEON STRL SZ 6 (GLOVE) ×6 IMPLANT
GLOVE BIO SURGEON STRL SZ 6.5 (GLOVE) ×6 IMPLANT
GOWN STRL REUS W/ TWL LRG LVL3 (GOWN DISPOSABLE) ×6 IMPLANT
GOWN STRL REUS W/TWL LRG LVL3 (GOWN DISPOSABLE) ×4
HANDLE SUCTION POOLE (INSTRUMENTS) IMPLANT
HEMOSTAT ARISTA ABSORB 3G PWDR (HEMOSTASIS) IMPLANT
KIT BASIN OR (CUSTOM PROCEDURE TRAY) ×3 IMPLANT
KIT TURNOVER KIT A (KITS) IMPLANT
LIGASURE IMPACT 36 18CM CVD LR (INSTRUMENTS) IMPLANT
LOOP VESSEL MAXI BLUE (MISCELLANEOUS) IMPLANT
NDL HYPO 21X1.5 SAFETY (NEEDLE) ×6 IMPLANT
NEEDLE HYPO 21X1.5 SAFETY (NEEDLE) ×4 IMPLANT
NS IRRIG 1000ML POUR BTL (IV SOLUTION) ×6 IMPLANT
PACK GENERAL/GYN (CUSTOM PROCEDURE TRAY) ×3 IMPLANT
RELOAD PROXIMATE 75MM BLUE (ENDOMECHANICALS) ×8 IMPLANT
RELOAD PROXIMATE TA60MM BLUE (ENDOMECHANICALS) IMPLANT
RELOAD STAPLE 60 BLU REG PROX (ENDOMECHANICALS) IMPLANT
RELOAD STAPLE 75 3.8 BLU REG (ENDOMECHANICALS) IMPLANT
RETRACTOR WND ALEXIS 18 MED (MISCELLANEOUS) IMPLANT
RETRACTOR WND ALEXIS 25 LRG (MISCELLANEOUS) IMPLANT
RTRCTR WOUND ALEXIS 18CM MED (MISCELLANEOUS)
RTRCTR WOUND ALEXIS 25CM LRG (MISCELLANEOUS)
SET TRI-LUMEN FLTR TB AIRSEAL (TUBING) IMPLANT
SHEET LAVH (DRAPES) ×3 IMPLANT
SLEEVE SUCTION CATH 165 (SLEEVE) ×3 IMPLANT
SOL PREP POV-IOD 4OZ 10% (MISCELLANEOUS) ×3 IMPLANT
SPONGE SURGIFLO 8M (HEMOSTASIS) IMPLANT
SPONGE T-LAP 18X18 ~~LOC~~+RFID (SPONGE) IMPLANT
STAPLER CVD CUT GN 40 RELOAD (ENDOMECHANICALS) ×2 IMPLANT
STAPLER CVD CUT GRN 40 RELOAD (ENDOMECHANICALS) IMPLANT
STAPLER GUN LINEAR PROX 60 (STAPLE) IMPLANT
STAPLER PROXIMATE 75MM BLUE (STAPLE) IMPLANT
STAPLER SKIN PROX WIDE 3.9 (STAPLE) IMPLANT
SUCTION POOLE HANDLE (INSTRUMENTS) ×2
SURGIFLO W/THROMBIN 8M KIT (HEMOSTASIS) IMPLANT
SUT ETHILON 3 0 PS 1 (SUTURE) IMPLANT
SUT MNCRL AB 4-0 PS2 18 (SUTURE) ×6 IMPLANT
SUT PDS AB 1 TP1 96 (SUTURE) ×6 IMPLANT
SUT PROLENE 0 CT 2 (SUTURE) IMPLANT
SUT PROLENE 0 SH 30 (SUTURE) IMPLANT
SUT SILK 3 0 SH CR/8 (SUTURE) IMPLANT
SUT VIC AB 0 CT1 36 (SUTURE) ×12 IMPLANT
SUT VIC AB 2-0 CT1 27 (SUTURE) ×6
SUT VIC AB 2-0 CT1 36 (SUTURE) ×6 IMPLANT
SUT VIC AB 2-0 CT1 TAPERPNT 27 (SUTURE) ×6 IMPLANT
SUT VIC AB 2-0 CT2 27 (SUTURE) ×18 IMPLANT
SUT VIC AB 2-0 SH 18 (SUTURE) IMPLANT
SUT VIC AB 2-0 SH 27 (SUTURE) ×2
SUT VIC AB 2-0 SH 27X BRD (SUTURE) IMPLANT
SUT VIC AB 3-0 CTX 36 (SUTURE) IMPLANT
SUT VIC AB 3-0 SH 18 (SUTURE) IMPLANT
SUT VIC AB 3-0 SH 27 (SUTURE)
SUT VIC AB 3-0 SH 27X BRD (SUTURE) ×3 IMPLANT
SUT VIC AB 4-0 PS2 27 (SUTURE) IMPLANT
SYR 20ML LL LF (SYRINGE) IMPLANT
SYR 30ML LL (SYRINGE) ×6 IMPLANT
SYR 50ML LL SCALE MARK (SYRINGE) IMPLANT
TOWEL OR 17X26 10 PK STRL BLUE (TOWEL DISPOSABLE) ×3 IMPLANT
TOWEL OR NON WOVEN STRL DISP B (DISPOSABLE) ×3 IMPLANT
TRAY FOLEY MTR SLVR 16FR STAT (SET/KITS/TRAYS/PACK) ×3 IMPLANT
UNDERPAD 30X36 HEAVY ABSORB (UNDERPADS AND DIAPERS) ×3 IMPLANT

## 2022-12-12 NOTE — Anesthesia Procedure Notes (Signed)
Procedure Name: Intubation Date/Time: 12/12/2022 1:01 PM  Performed by: Maurene Capes, CRNAPre-anesthesia Checklist: Patient identified, Emergency Drugs available, Suction available and Patient being monitored Patient Re-evaluated:Patient Re-evaluated prior to induction Oxygen Delivery Method: Circle System Utilized Preoxygenation: Pre-oxygenation with 100% oxygen Induction Type: IV induction Ventilation: Mask ventilation without difficulty Laryngoscope Size: Mac and 4 Tube type: Oral Tube size: 7.0 mm Number of attempts: 1 Airway Equipment and Method: Stylet Placement Confirmation: ETT inserted through vocal cords under direct vision, positive ETCO2 and breath sounds checked- equal and bilateral Secured at: 20 cm Tube secured with: Tape Dental Injury: Teeth and Oropharynx as per pre-operative assessment

## 2022-12-12 NOTE — Op Note (Addendum)
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    Pathology: Omentum, cecal nodule, ileum, left lower anterior abdominal wall nodule, right tube and ovary, left tube and ovary en bloc with rectosigmoid colon  Operative findings: On diagnostic laparoscopy, some miliary disease noted along the diaphragm, treated disease versus active.  There was approximately 2 and half liters of almost chylous appearing ascites within the abdominal cavity.  Omentum with visible tumor adherent to the anterior abdominal wall in the mid abdomen.  Given findings, decision to made to convert to open for debulking.  Significant omental cake still in the infracolic omentectomy.  There was some thickness of the infra gastric omentectomy although no obvious tumor deposits.  Smooth liver, very small miliary disease on bilateral diaphragms.  Miliary disease versus treated tumor on vast majority of the small bowel and small bowel mesentery as well as the cecum.  1 of these areas was resected from the cecal mesentery.  All of his miliary disease was less than 2-3 mm in size.  Proximately 20 cm from the ileocecal junction, there was a matted loop of ileum that appeared to be involved by tumor which was  resected.  Otherwise no larger tumor involving the small bowel.  Right fallopian tube and ovary normal in appearance.  Left fallopian tube and ovary adherent to and visually inseparable from the left distal sigmoid and rectum.  Multiple areas of disease noted on the rectosigmoid colon requiring a low anterior resection, about 8-10 cm from the anal verge.  During mobilization of the descending colon and the splenic flexure, the patient again became hypotensive.  Given underlying diverticular disease and her history of ulcerative proctosigmoiditis as well as desire to complete the surgery given her hemodynamic status, decision made not to reanastomose her colon. R1 resection at the end of surgery due to what is presumed to be miliary disease on small bowel, small bowel mesentery, and diaphragms.  Procedure Details  The patient was seen in the Holding Room. The risks, benefits, complications, treatment options, and expected outcomes were discussed with the patient.  The patient concurred with the proposed plan, giving informed consent.  The site of surgery properly noted/marked. The patient was identified as Rachel Carlson and the procedure verified as a diagnostic laparoscopy, possible tumor debulking.   After induction of anesthesia, the patient was draped and prepped in the usual sterile manner. Patient was placed in supine position after anesthesia and draped and prepped in the usual sterile manner as follows: Her left arm was tucked to her side with all appropriate precautions.  The patient was secured to the bed using padding and tape across her chest.  The patient was placed in the semi-lithotomy position in [place] stirrups.  The perineum and vagina were prepped with Betadine. The patient's abdomen was prepped with ChloraPrep and then she was draped after the prep had been allowed to  dry for 3 minutes.  A Time Out was held and the above information confirmed.  The urethra was prepped with Betadine.  Foley catheter was placed.  OG tube placement was confirmed and to suction.   Next, a 5 mm skin incision was made 1 cm below the subcostal margin in the midclavicular line.  The 5 mm Optiview port and scope was used for direct entry.  Opening pressure was under 10 mm CO2.  The abdomen was insufflated and the findings were noted as above.    At this time, decision was made to proceed with exploratory laparotomy.  The patient's arm was untucked.  With the abdomen still insufflated, a vertical paramedian incision was and carried down to the underlying fascia using Bovie cautery. The fascia was scored in the fascial incision was extended superiorly and inferiorly using Bovie cautery. The rectus bellies were dissected off the overlying fascia. The peritoneum was tented and entered. The peritoneal incision was extended superiorly and inferiorly with visualization of the underlying peritoneal cavity.  Attention was first turned to the area of omental caking that was adherent to the left anterior abdominal wall.  Using monopolar electrocautery, this was dissected free.  The entire length of the transverse colon was identified.  Using monopolar electrocautery, the omental cake and omentum was dissected free from the transverse colon.  The lesser sac was eventually identified and developed and free of disease.  The stomach was also free of disease.  Given some thicker omentum just inferior to the stomach, a total omentectomy was performed using the LigaSure device.  This was then handed off the field.  Upper abdomen was explored.  The small bowel was run from the cecum to the ligament of Treitz.  A nodule from the cecal mesentery was excised and handed off the field.  The segment of ileum that appeared to be involved by tumor was identified.  A window was created at the junction of the bowel wall and mesentery distal to this area. The bowel stapler was passed through the window and and the 60 mm GIA stapler was  deployed and fired. At the proximal margin a similar process took place. The mesentery was scored then sealed and transected with ligasure to free up the sgment of ileum. The Bookwalter was set up small bowel was packed into the upper abdomen.  The nodule along the left lower abdominal wall was again noted and excised from the overlying rectus.  This was similarly handed off the field.   The pelvis was inspected.,  The left fallopian tube and ovary were grasped with a Babcock.  The retroperitoneal space was opened parallel to the infundibulopelvic ligament and the remanent round ligament was transected.  Retroperitoneal space was opened and the ureter was identified along the medial leaf the broad ligament.  A window was made superior to the ureter along the medial leaf of the broad ligament.  The infundibular ligament was isolated, cauterized, and transected.  The remaining peritoneal attachments of the right tube and ovary were cauterized using monopolar electrocautery.  The right tube and ovary were handed off the field.  On the left, the retroperitoneum was opened parallel to what appeared to be the infundibulopelvic ligament.  The left tube and ovary were not easily identifiable given the location of the sigmoid colon and its involvement.  With retraction of the sigmoid colon, the retroperitoneum was further opened and the external and internal iliac vessels identified.  The ureter was identified along the medial leaf  of the broad ligament as were the ovarian vessels.  The infundibulopelvic ligament was isolated, cauterized, and transected.  Given some retroperitoneal fibrosis and proximity of the ureter to the left adnexal mass, ureterolysis was performed at the level just below the pelvic brim using a right angle clamp to 360 the ureter.  The ureter was tagged with a vessel loop.  The proximal aspect of the sigmoid colon was identified where there was clearly no disease.  The descending colon and  sigmoid were mobilized free from the sidewall along the white line of Toldt.  With visualization of the ureter, a window was made in the mesentery of the proximal sigmoid colon and the GIA 60 blue load was used to staple and transect the sigmoid colon.  With visualization of bilateral ureters, the LigaSure device was used to cauterize and transect the left colonic mesentery.  Distal branches of the IMA and IMV were identified during this dissection and sealed with bipolar electrocautery.  The proximal end of the sigmoid was then packed into the upper abdomen.   Retraction on the sigmoid, combination of blunt dissection and short bursts of monopolar electrocautery were used to slowly mobilize the left adnexa from the underlying ureter.  An area of bladder peritoneum was adherent to the anterior aspect of the left adnexal mass and sigmoid colon.  This was slowly mobilized using monopolar electrocautery.  Once the adnexal mass and sigmoid had been freed from the sidewall, the prerectal space was developed using monopolar and bipolar electrocautery, ensuring both ureters were seen and lateral to this dissection.  The LigaSure device was used to cauterize and transect the mesentery posteriorly down to the level of the rectum.  Area of the rectum to be transected was identified below the level of involved colon.  The rectum was tubularized at this level using the LigaSure device.  The curved contour stapler with a blue load was then used to staple and transect across the rectal tube, approximately 8-10 cm from the anal verge, ultimately freeing the en bloc rectosigmoid colon, left tube and ovary.  This was handed off the field.  The distal rectal stump was tagged with Prolene.  Several small clips were used along the left pelvic sidewall to control small bleeding vessels.  Pelvis was irrigated and good hemostasis noted.  The small bowel and descending colon were unpacked.  During your proximal mobilization of the  descending colon and the splenic flexure, the patient hypotensive.  Although she was stabilized, was alerted that she has been intermittently hypotensive since the start of surgery.  In the setting of still needing to reanastomose her small bowel and given her ulcerative disease and diverticular disease, decision made not to proceed with 3 anastomosis of her colon and rather and descending colostomy.  Small bowel was repacked.  Cystoscopy was given concerned that there had been some blood in the urine and due to the difficult dissection near the left ureter and bladder.  The bladder was backfilled with 200 cc of sterile fluid and the Foley catheter removed.  On cystoscopy, bladder dome was noted to be intact and there was good efflux from bilateral ureteral orifices.  Gloves were then changed.  Pelvis was again irrigated and good hemostasis noted.  Giving raw peritoneal edges, Floseal was placed along the pelvic surgical beds and pressure held.  During this time, a small incision was placed in the right lower quadrant and a 63F JP was brought into the pelvis and secured to the skin  using a drain stitch.  With hemostasis noted in the pelvis.  Small bowel was unpacked again.  The distal and proximal ends of the ileum were identified and grasped with Allis clamps. The corners of the bowel ends were opened. The GIA stapler was passed into these limbs, deployed and fired to create a stapled functional side-to-side anastamosis. The opening of the two limbs was reapproximated with allis clamps. A 60mm TA stapler was used to seal the top of the anastamosis with a double layer of staples.  The anastomosis was oversewn with 2-0 Vicryl.  The mesenteric window was closed with 2-0vicryl interrupted.   After abdomen was inspected again and hemostasis noted.  An area in the left upper quadrant was chosen for the colostomy to assure that the descending colon would not be on too much tension.  With the skin elevated using  a Kocher, an ostomy incision was made in the skin using electrocautery. This was carried down through the subcutaneous tissues and the fascia was divided in a cruciate manner with electrocautery. The rectus muscle was then divided with blunt dissection and the posterior fascia and peritoneum were entered.  The incision was stretched to accommodate 2 fingerbreadths and the descending colon was brought out through this and secured with a Babcock.    The retractor and laparotomy sponges were removed. The fascia was closed using running mass closure of #1 PDS tied in the midline.  Subcutaneous tissue was irrigated.  Exparel mixed with 0.25% Marcaine was then injected for local anesthesia.  The skin was closed with staples.  Sterile dressing was applied.  The trocar incision was closed with 4-0 Monocryl in subcuticular fashion and Dermabond was applied.  Once the Dermabond had dried, the staple line of the descending colon was excised and the ostomy was matured in standard fashion using interrupted 2-0 Vicryl sutures.  Ostomy appliance was applied.  All instrument, suture, laparotomy, Ray-Tec, and needle counts were correct x2. The patient tolerated the procedure well and was taken recovery room in stable condition.  Eugene Garnet MD Gynecologic Oncology

## 2022-12-12 NOTE — Brief Op Note (Signed)
12/12/2022  5:45 PM  PATIENT:  Rachel Carlson  77 y.o. female  PRE-OPERATIVE DIAGNOSIS:  ADVANCED GYN MALIGNANCY  POST-OPERATIVE DIAGNOSIS:  ADVANCED GYN MALIGNANCY  PROCEDURE:  Procedure(s): DIAGNOSTIC LAPAROSCOPY (Bilateral) OMENTECTOMY (N/A) RADICAL TUMOR DEBULKING, PERITONEAL STRIPPING, MOBILIZATION SPLENIC FLEXURE, RECTOSIGMOID RESECTION,SMALL BOWEL RESECTION AND REANASTOMOSIS ,END TO END DESCENDING COLOSTOMY (N/A) OPEN BILATERAL SALPINGECTOMY-OOPHORECTOMY (Bilateral) Cystoscopy  SURGEON:  Surgeons and Role:    Carver Fila, MD - Primary  ASSISTANTS: Warner Mccreedy, NP   ANESTHESIA:   general  EBL:  500 mL   BLOOD ADMINISTERED:none  DRAINS: 19F JP   LOCAL MEDICATIONS USED:  MARCAINE     SPECIMEN:  omentum, cecal nodule, left lower anterior abdominal wall nodule, segment of ileum, right tube and ovary, left tube and ovary removed end bloc with rectosigmoid colon  DISPOSITION OF SPECIMEN:  PATHOLOGY  COUNTS:  YES  TOURNIQUET:  * No tourniquets in log *  DICTATION: .Note written in EPIC  PLAN OF CARE: Admit to inpatient   PATIENT DISPOSITION:  PACU - hemodynamically stable.   Delay start of Pharmacological VTE agent (>24hrs) due to surgical blood loss or risk of bleeding: no

## 2022-12-12 NOTE — Interval H&P Note (Signed)
History and Physical Interval Note:  12/12/2022 10:44 AM  Rachel Carlson  has presented today for surgery, with the diagnosis of ADVANCED GYN MALIGNANCY.  The various methods of treatment have been discussed with the patient and family. After consideration of risks, benefits and other options for treatment, the patient has consented to  Procedure(s): DIAGNOSTIC LAPAROSCOPY POSSIBLE OPEN BILATERAL SALPINGO OOPHORECTOMY (Bilateral) POSSIBLE OMENTECTOMY (N/A) POSSIBLE DEBULKING, POSSIBLE BOWEL SURGERY (N/A) as a surgical intervention.  The patient's history has been reviewed, patient examined, no change in status, stable for surgery.  I have reviewed the patient's chart and labs.  Questions were answered to the patient's satisfaction.     Carver Fila

## 2022-12-12 NOTE — Transfer of Care (Signed)
Immediate Anesthesia Transfer of Care Note  Patient: Rachel Carlson  Procedure(s) Performed: DIAGNOSTIC LAPAROSCOPY (Bilateral) OMENTECTOMY RADICAL TUMOR DEBULKING, PERITONEAL STRIPPING, MOBILIZATION SPLENIC FLEXURE, RECTOSIGMOID RESECTION,SMALL BOWEL RESECTION AND REANASTOMOSIS ,END TO END DESCENDING COLOSTOMY OPEN BILATERAL SALPINGECTOMY-OOPHORECTOMY (Bilateral)  Patient Location: PACU  Anesthesia Type:General  Level of Consciousness: sedated  Airway & Oxygen Therapy: Patient Spontanous Breathing and Patient connected to face mask oxygen  Post-op Assessment: Report given to RN and Post -op Vital signs reviewed and unstable, Anesthesiologist notified  Post vital signs: Reviewed and stable  Last Vitals:  Vitals Value Taken Time  BP 111/69 12/12/22 1800  Temp    Pulse 64 12/12/22 1805  Resp 14 12/12/22 1805  SpO2 100 % 12/12/22 1805  Vitals shown include unfiled device data.  Last Pain:  Vitals:   12/12/22 1104  TempSrc: Oral  PainSc:          Complications: No notable events documented.

## 2022-12-13 ENCOUNTER — Encounter (HOSPITAL_COMMUNITY): Payer: Self-pay | Admitting: Gynecologic Oncology

## 2022-12-13 LAB — BASIC METABOLIC PANEL
Anion gap: 7 (ref 5–15)
Anion gap: 5 (ref 5–15)
BUN: 10 mg/dL (ref 8–23)
BUN: 12 mg/dL (ref 8–23)
CO2: 22 mmol/L (ref 22–32)
CO2: 24 mmol/L (ref 22–32)
Calcium: 8.5 mg/dL — ABNORMAL LOW (ref 8.9–10.3)
Calcium: 8.3 mg/dL — ABNORMAL LOW (ref 8.9–10.3)
Chloride: 105 mmol/L (ref 98–111)
Chloride: 104 mmol/L (ref 98–111)
Creatinine, Ser: 0.6 mg/dL (ref 0.44–1.00)
Creatinine, Ser: 0.58 mg/dL (ref 0.44–1.00)
GFR, Estimated: >60 mL/min (ref >60)
GFR, Estimated: >60 mL/min (ref >60)
Glucose, Bld: 212 mg/dL — ABNORMAL HIGH (ref 70–99)
Glucose, Bld: 182 mg/dL — ABNORMAL HIGH (ref 70–99)
Potassium: 4.3 mmol/L (ref 3.5–5.1)
Potassium: 4.3 mmol/L (ref 3.5–5.1)
Sodium: 134 mmol/L — ABNORMAL LOW (ref 135–145)
Sodium: 133 mmol/L — ABNORMAL LOW (ref 135–145)

## 2022-12-13 LAB — CBC
HCT: 32.9 % — ABNORMAL LOW (ref 36.0–46.0)
HCT: 32.2 % — ABNORMAL LOW (ref 36.0–46.0)
Hemoglobin: 11 g/dL — ABNORMAL LOW (ref 12.0–15.0)
Hemoglobin: 10.5 g/dL — ABNORMAL LOW (ref 12.0–15.0)
MCH: 31.3 pg (ref 26.0–34.0)
MCH: 30.9 pg (ref 26.0–34.0)
MCHC: 33.4 g/dL (ref 30.0–36.0)
MCHC: 32.6 g/dL (ref 30.0–36.0)
MCV: 93.7 fL (ref 80.0–100.0)
MCV: 94.7 fL (ref 80.0–100.0)
Platelets: 145 10*3/uL — ABNORMAL LOW (ref 150–400)
Platelets: 144 10*3/uL — ABNORMAL LOW (ref 150–400)
RBC: 3.51 MIL/uL — ABNORMAL LOW (ref 3.87–5.11)
RBC: 3.4 MIL/uL — ABNORMAL LOW (ref 3.87–5.11)
RDW: 17.9 % — ABNORMAL HIGH (ref 11.5–15.5)
RDW: 18.2 % — ABNORMAL HIGH (ref 11.5–15.5)
WBC: 12.9 10*3/uL — ABNORMAL HIGH (ref 4.0–10.5)
WBC: 13.1 10*3/uL — ABNORMAL HIGH (ref 4.0–10.5)
nRBC: 0 % (ref 0.0–0.2)
nRBC: 0 % (ref 0.0–0.2)

## 2022-12-13 MED ORDER — ENOXAPARIN SODIUM 40 MG/0.4ML IJ SOSY: 40.0000 mg | PREFILLED_SYRINGE | INTRAMUSCULAR | Status: DC

## 2022-12-13 MED ORDER — SODIUM CHLORIDE 0.9 % IV BOLUS
500.0000 mL | Freq: Once | INTRAVENOUS | Status: AC
Start: 2022-12-13 — End: 2022-12-13
  Administered 2022-12-13: 500 mL via INTRAVENOUS

## 2022-12-13 NOTE — Progress Notes (Signed)
GYN Oncology Progress Note  Patient is alert, oriented, in no acute distress, resting in bed. Husband and son at the bedside. Patient reports having slightly more incisional pain this afternoon. Does not want a kpad at this time. 150 cc of light tea colored urine emptied from foley. Foley checked for flow obstructions. 25 cc of serosanguinous drainage emptied from JP drain. Trace amounts of serosanguinous drainage noted in ostomy. 250 cc of output noted in the NG canister. HR at 114. Patient asking about ambulating this afternoon. RN updated on request and output values. No needs voiced per patient at this time. Dr. Pricilla Holm updated on current status. Given low output, mild tachycardia, plan for normal saline bolus. Earlier labs have been reviewed. Continue with current plan of care, step down monitoring.   CBC    Component Value Date/Time   WBC 13.1 (H) 12/13/2022 1054   RBC 3.40 (L) 12/13/2022 1054   HGB 10.5 (L) 12/13/2022 1054   HCT 32.2 (L) 12/13/2022 1054   PLT 144 (L) 12/13/2022 1054   MCV 94.7 12/13/2022 1054   MCH 30.9 12/13/2022 1054   MCHC 32.6 12/13/2022 1054   RDW 18.2 (H) 12/13/2022 1054   LYMPHSABS 1.1 12/06/2022 1047   MONOABS 0.6 12/06/2022 1047   EOSABS 0.0 12/06/2022 1047   BASOSABS 0.0 12/06/2022 1047    BMET    Component Value Date/Time   NA 133 (L) 12/13/2022 1054   K 4.3 12/13/2022 1054   CL 104 12/13/2022 1054   CO2 24 12/13/2022 1054   GLUCOSE 182 (H) 12/13/2022 1054   BUN 12 12/13/2022 1054   CREATININE 0.58 12/13/2022 1054   CALCIUM 8.3 (L) 12/13/2022 1054   GFRNONAA >60 12/13/2022 1054

## 2022-12-13 NOTE — Progress Notes (Signed)
GYN Oncology Progress Note  1 Day Post-Op Procedure(s) (LRB): DIAGNOSTIC LAPAROSCOPY (Bilateral) OMENTECTOMY (N/A) RADICAL TUMOR DEBULKING, PERITONEAL STRIPPING, MOBILIZATION SPLENIC FLEXURE, RECTOSIGMOID RESECTION,SMALL BOWEL RESECTION AND REANASTOMOSIS ,END TO END DESCENDING COLOSTOMY (N/A) OPEN BILATERAL SALPINGECTOMY-OOPHORECTOMY (Bilateral)  Subjective: Patient reports nausea this am. Abdominal pain reported as mild this am. No concerns voiced at this time.    Objective: Vital signs in last 24 hours: Temp:  [96.4 F (35.8 C)-99 F (37.2 C)] 99 F (37.2 C) (11/14 0351) Pulse Rate:  [59-105] 105 (11/14 0300) Resp:  [13-19] 18 (11/14 0300) BP: (56-137)/(43-82) 115/64 (11/14 0300) SpO2:  [94 %-100 %] 95 % (11/14 0300) Weight:  [171 lb (77.6 kg)] 171 lb (77.6 kg) (11/13 1051)    Intake/Output from previous day: 11/13 0701 - 11/14 0700 In: 3451.4 [I.V.:2301.4; IV Piggyback:1150] Out: 4345 [Urine:1050; Emesis/NG output:35; Drains:110; Blood:500]  Physical Examination (performed by Dr. Pricilla Holm): General: alert, cooperative, and no distress Resp: clear to auscultation bilaterally Cardio: tachycardic at 121 beats per min, regular in rhythm GI: incision: midline abdominal incision with staples with op site dressing in place with no drainage noted underneath, laparoscopic incision with dermabond to the left upper abdomen intact and right abdominal JP drain charged with sanguinous drainage, ostomy to the left abdomen with applicance intact, stoma pink, no output in bag. NG tube to intermittent suction, around 150 in the canister, checked suction and flushed NG tube with 30 cc sterile saline given pt's nausea, slow drainage of contents in tubing. RN to assess as well. Faint abdominal bowel sounds, abdomen is soft. Extremities: extremities normal, atraumatic, no cyanosis or edema Urine in foley yellow. NG to intermittent suction with 150 cc.  Labs: WBC/Hgb/Hct/Plts:  12.9/11.0/32.9/145  (11/14 0347) BUN/Cr/glu/ALT/AST/amyl/lip:  10/0.60/--/--/--/--/-- (11/14 3875)  Assessment: 77 y.o. s/p Procedure(s): DIAGNOSTIC LAPAROSCOPY, OMENTECTOMY, RADICAL TUMOR DEBULKING, PERITONEAL STRIPPING, MOBILIZATION SPLENIC FLEXURE, RECTOSIGMOID RESECTION,SMALL BOWEL RESECTION AND REANASTOMOSIS ,END TO END DESCENDING COLOSTOMY. OPEN BILATERAL SALPINGECTOMY-OOPHORECTOMY: stable Pain:  Pain is well-controlled on PRN medications.  Heme: Hgb 11.0 and Hct 32.9 this am. Given tachycardia, plan for repeat labs later this am.  ID: WBC 12.9 this am, given decadron and intra-op IV abxs. No evidence of infection at this time.  CV: BP and HR monitored while in stepdown. Tachycardic at 120-121 bpm.   GI:  Tolerating po: No, NPO. NG tube in place to intermittent suction. S/P rectosigmoid resection, small bowel resection with reanastomosis, end to end descending colostomy. IV protonix ordered.  GU: Creatinine is 0.60 this am. Foley in place and will continue for close output monitoring. May discontinue later today based on repeat labs, output values.  FEN: No critical values on am labs. Na+ 134.  Prophylaxis: SCDs, lovenox on hold pending repeat am labs  Plan: Plan for repeat labs later this am Continue stepdown monitoring today NG tube set up assessed. RN to assess as well given slow drainage and pt with nausea Continue plan of care   LOS: 1 day    Doylene Bode 12/13/2022, 8:26 AM

## 2022-12-13 NOTE — Consult Note (Signed)
WOC Nurse ostomy consult note Stoma type/location: LMQ descending colostomy Stomal assessment/size: 2" edematous wel budded Peristomal assessment: not assessed  <1day post op. Honeycomb dressing in place and pouch overlaps this dressing.  Will change Friday.  Treatment options for stomal/peristomal skin: Likely will be fine with 2 piece flat.   Output NOne at this time.  Ostomy pouching: 1pc. Convex in place  2 piece flat will likely be sufficient Education provided: Spouse and son at bedside.  Explained role of WOC nurse team for ostomy teaching and management.  Will begin teaching Friday.  Today, patient is lying in bed with eyes closed and does not respond to questions.   Enrolled patient in [employer] DC program: No will enroll after assessment.  Will follow.  Mike Gip MSN, RN, FNP-BC CWON Wound, Ostomy, Continence Nurse [facility] [phone] Pager [phone]

## 2022-12-14 LAB — CBC
HCT: 26.7 % — ABNORMAL LOW (ref 36.0–46.0)
HCT: 26.1 % — ABNORMAL LOW (ref 36.0–46.0)
Hemoglobin: 8.6 g/dL — ABNORMAL LOW (ref 12.0–15.0)
Hemoglobin: 8.6 g/dL — ABNORMAL LOW (ref 12.0–15.0)
MCH: 30.9 pg (ref 26.0–34.0)
MCH: 31.5 pg (ref 26.0–34.0)
MCHC: 32.2 g/dL (ref 30.0–36.0)
MCHC: 33 g/dL (ref 30.0–36.0)
MCV: 96 fL (ref 80.0–100.0)
MCV: 95.6 fL (ref 80.0–100.0)
Platelets: 122 10*3/uL — ABNORMAL LOW (ref 150–400)
Platelets: 115 10*3/uL — ABNORMAL LOW (ref 150–400)
RBC: 2.78 MIL/uL — ABNORMAL LOW (ref 3.87–5.11)
RBC: 2.73 MIL/uL — ABNORMAL LOW (ref 3.87–5.11)
RDW: 18.3 % — ABNORMAL HIGH (ref 11.5–15.5)
RDW: 18.2 % — ABNORMAL HIGH (ref 11.5–15.5)
WBC: 11.8 10*3/uL — ABNORMAL HIGH (ref 4.0–10.5)
WBC: 11.6 10*3/uL — ABNORMAL HIGH (ref 4.0–10.5)
nRBC: 0 % (ref 0.0–0.2)
nRBC: 0 % (ref 0.0–0.2)

## 2022-12-14 LAB — BASIC METABOLIC PANEL
Anion gap: 4 — ABNORMAL LOW (ref 5–15)
BUN: 10 mg/dL (ref 8–23)
CO2: 26 mmol/L (ref 22–32)
Calcium: 8.6 mg/dL — ABNORMAL LOW (ref 8.9–10.3)
Chloride: 103 mmol/L (ref 98–111)
Creatinine, Ser: 0.47 mg/dL (ref 0.44–1.00)
GFR, Estimated: >60 mL/min (ref >60)
Glucose, Bld: 152 mg/dL — ABNORMAL HIGH (ref 70–99)
Potassium: 4.4 mmol/L (ref 3.5–5.1)
Sodium: 133 mmol/L — ABNORMAL LOW (ref 135–145)

## 2022-12-14 MED ORDER — METHOCARBAMOL 1000 MG/10ML IJ SOLN
500.0000 mg | Freq: Three times a day (TID) | INTRAMUSCULAR | Status: DC | PRN
Start: 2022-12-14 — End: 2022-12-21
  Administered 2022-12-14 – 2022-12-16 (×4): 500 mg via INTRAVENOUS
  Filled 2022-12-14 (×4): qty 10

## 2022-12-14 MED ORDER — OXYCODONE HCL 5 MG PO TABS
5.0000 mg | ORAL_TABLET | ORAL | Status: DC | PRN
  Administered 2022-12-15 – 2022-12-21 (×9): 5 mg via ORAL
  Filled 2022-12-14 (×11): qty 1

## 2022-12-14 MED ORDER — BOOST / RESOURCE BREEZE PO LIQD CUSTOM
1.0000 | Freq: Three times a day (TID) | ORAL | Status: DC
  Administered 2022-12-14: 1 via ORAL

## 2022-12-14 MED ORDER — HYDROMORPHONE HCL 1 MG/ML IJ SOLN: 0.2500 mg | INTRAMUSCULAR | Status: DC | PRN

## 2022-12-14 MED ORDER — ENOXAPARIN SODIUM 40 MG/0.4ML IJ SOSY
40.0000 mg | PREFILLED_SYRINGE | INTRAMUSCULAR | Status: DC
Start: 2022-12-14 — End: 2022-12-15
  Administered 2022-12-14: 40 mg via SUBCUTANEOUS
  Filled 2022-12-14: qty 0.4

## 2022-12-14 MED ORDER — ADULT MULTIVITAMIN W/MINERALS CH
1.0000 | ORAL_TABLET | Freq: Every day | ORAL | Status: DC
  Administered 2022-12-15 – 2022-12-21 (×7): 1 via ORAL
  Filled 2022-12-14 (×7): qty 1

## 2022-12-14 NOTE — Progress Notes (Signed)
GYN Oncology Progress Note  Patient has just been transferred from ICU. Currently sitting in the chair. Has had some nausea since NG tube removal. States drinking cold water earlier hurt her stomach. No emesis. Abdominal pain is managed. No lightheadedness or dizziness.   Pt pale appearing, alert, oriented, in no acute distress, husband and son at the bedside. Moderately hypo bowel sounds, abdomen soft, ostomy with minimal amount of serosanguinous liquid. JP with continued serosanguinous drainage. 30 cc of concentrated yellow urine in the foley. Pulse at 95-96 bpm.  RN to bring in another set of SCDs and an IS. Continue with current plan of care. Antiemetics ordered if needed. Repeat CBC today stable. Plan for continued IVF, am labs.   CBC    Component Value Date/Time   WBC 11.6 (H) 12/14/2022 1245   RBC 2.73 (L) 12/14/2022 1245   HGB 8.6 (L) 12/14/2022 1245   HCT 26.1 (L) 12/14/2022 1245   PLT 115 (L) 12/14/2022 1245   MCV 95.6 12/14/2022 1245   MCH 31.5 12/14/2022 1245   MCHC 33.0 12/14/2022 1245   RDW 18.2 (H) 12/14/2022 1245   LYMPHSABS 1.1 12/06/2022 1047   MONOABS 0.6 12/06/2022 1047   EOSABS 0.0 12/06/2022 1047   BASOSABS 0.0 12/06/2022 1047

## 2022-12-14 NOTE — Progress Notes (Signed)
Initial Nutrition Assessment  DOCUMENTATION CODES:   Obesity unspecified  INTERVENTION:  - Clear Liquid diet per MD.  - Boost Breeze po TID, each supplement provides 250 kcal and 9 grams of protein - Encourage intake as tolerated.  - Add Multivitamin with minerals daily - Monitor weight trends.   - Recommend changing to Ensure Plus High Protein po BID once diet advanced past clears. Each supplement provides 350 kcal and 20 grams of protein.   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  GOAL:   Patient will meet greater than or equal to 90% of their needs  MONITOR:   PO intake, Supplement acceptance, Diet advancement, Weight trends  REASON FOR ASSESSMENT:   Malnutrition Screening Tool    ASSESSMENT:   77 y.o. 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   11/13 s/p ex-lap, radical tumor debulking, total omentectomy,  resection of peritoneal disease, small bowel resection with 3 anastomosis, en bloc resection of the left fallopian tube and ovary as well as rectosigmoid colon, right salpingo-oophorectomy, end descending colostomy; NGT placed 11/15 NGT removed, CLD  Patient sleeping in chair at time of visit, breakfast tray untouched.  Husband and son at bedside provided all nutrition history.   UBW reported to be 195# and they report she has been losing weight since at least June. They noted the weight has slowed down some over the past ~1 month.  Per EMR, patient weighed at 198# in April and weighed at 170# at the end of September. This is a 28# or 14% weight loss in 6 months, which is significant for the time frame. Weight appears stable the past 1 month.  They share patient has not been eating well for several months.  Typically only eats 2 meals a day but recently they have been very small meals. Drinking Ensure once a day since August. Appetite has been poor.   Patient did not appear to have touched her breakfast tray this AM. Discussed with family and RN about ordering fresh lunch tray which was done. They feel she would be open to trying [employer]. Would like Ensure once diet advanced further.     Medications reviewed and include: -  Labs reviewed:  Na 133   NUTRITION - FOCUSED PHYSICAL EXAM:  Unable to perform, patient sleeping  Diet Order:   Diet Order             Diet clear liquid Room service appropriate? Yes; Fluid consistency: Thin  Diet effective now                   EDUCATION NEEDS:  Education needs have been addressed  Skin:  Skin Assessment: Skin Integrity Issues: Skin Integrity Issues:: Incisions Incisions: Abdomen  Last BM:  PTA - new colostomy  Height:  Ht Readings from Last 1 Encounters:  12/12/22 5\' 2"  (1.575 m)   Weight:  Wt Readings from Last 1 Encounters:  12/12/22 77.6 kg   Ideal Body Weight:  50 kg  BMI:  Body mass index is 31.28 kg/m.  Estimated Nutritional Needs:  Kcal:  1700-2000 kcals Protein:  95-115 grams Fluid:  >/= 1.7L    Shelle Iron RD, LDN For contact information, refer to [facility].

## 2022-12-14 NOTE — Consult Note (Signed)
WOC Nurse ostomy follow up Stoma type/location: LLQ, end colostomy Stomal assessment/size: 1 3/4", pink, moist, visualized through pouch  Peristomal assessment: NA Treatment options for stomal/peristomal skin: NA Output none Ostomy pouching: 2pc. In place from OR, I have left 2pc 2 3/4" with barrier rings in the room for the weekend should the staff need them  Education provided: no family in the room. Patient up in the chair. She can not keep eyes open during my introduction. Or during my visit. Assessed pouch, intact from OR. Educational materials and extra supplies left in the patient's room. Will follow up later in the day to see if patient back in bed and able to participate in teaching.   Enrolled patient in [place] [employer] Discharge program: Yes, verified address  WOC Nurse will follow along with you for continued support with ostomy teaching and care Andilyn Bettcher [facility] MSN, RN, [place], CNS, [place] [phone]

## 2022-12-14 NOTE — Progress Notes (Signed)
GYN Oncology Progress Note  2 Days Post-Op Procedure(s) (LRB): DIAGNOSTIC LAPAROSCOPY (Bilateral) OMENTECTOMY (N/A) RADICAL TUMOR DEBULKING, PERITONEAL STRIPPING, MOBILIZATION SPLENIC FLEXURE, RECTOSIGMOID RESECTION,SMALL BOWEL RESECTION AND REANASTOMOSIS ,END TO END DESCENDING COLOSTOMY (N/A) OPEN BILATERAL SALPINGECTOMY-OOPHORECTOMY (Bilateral)  Subjective: Patient reports no nausea this am. Dangled on the side of the bed yesterday. Abdominal pain managed with prn medications. No flatus. No concerns voiced at this time.    Objective: Vital signs in last 24 hours: Temp:  [98 F (36.7 C)-99 F (37.2 C)] 98.2 F (36.8 C) (11/15 0410) Pulse Rate:  [101-120] 101 (11/15 0600) Resp:  [8-20] 15 (11/15 0600) BP: (105-137)/(43-62) 135/43 (11/15 0600) SpO2:  [91 %-97 %] 94 % (11/15 0600) Last BM Date :  (PTA)  Intake/Output from previous day: 11/14 0701 - 11/15 0700 In: 3975.3 [I.V.:3493.8; IV Piggyback:481.5] Out: 1500 [Urine:645; Emesis/NG output:680; Drains:175]  Physical Examination (performed by Dr. Pricilla Holm): General: alert, cooperative, and no distress Resp: clear to auscultation bilaterally Cardio: mildly tachycardic at 104 beats per min, regular in rhythm GI: incision: midline abdominal incision with staples with op site dressing in place with no drainage noted underneath, laparoscopic incision with dermabond to the left upper abdomen intact and right abdominal JP drain charged with sanguinous drainage, ostomy to the left abdomen with applicance intact, stoma pink, no output in bag. NG tube not connected to suction. Moderately hypoactive abdominal bowel sounds, abdomen is soft. Extremities: extremities normal, atraumatic, no cyanosis or edema Urine in foley dark yellow. NG tube removed without difficulty  Labs: WBC/Hgb/Hct/Plts:  11.8/8.6/26.7/122 (11/15 0981) BUN/Cr/glu/ALT/AST/amyl/lip:  10/0.47/--/--/--/--/-- (11/15 1914)  Assessment: 77 y.o. s/p Procedure(s): DIAGNOSTIC  LAPAROSCOPY, OMENTECTOMY, RADICAL TUMOR DEBULKING, PERITONEAL STRIPPING, MOBILIZATION SPLENIC FLEXURE, RECTOSIGMOID RESECTION,SMALL BOWEL RESECTION AND REANASTOMOSIS ,END TO END DESCENDING COLOSTOMY. OPEN BILATERAL SALPINGECTOMY-OOPHORECTOMY: stable Pain:  Pain is well-controlled on PRN medications.  Heme: Hgb 8.6 and Hct 26.7 from 11.0 and Hct 32.9 yesterday am. Plan for repeat labs later this am.  ID: WBC 11.8 from 12.9 yesterday am, given decadron and intra-op IV abxs. No evidence of infection at this time.  CV: BP and HR monitored while in stepdown. Tachycardic improving now at 104 bpm.   GI:  Tolerating po: No, NPO. NG tube removed this am-diet to clears. S/P rectosigmoid resection, small bowel resection with reanastomosis, end to end descending colostomy. IV protonix ordered.  GU: Creatinine is 0.47 this am. Foley in place and will continue for close output monitoring.   FEN: No critical values on am labs. Na+ 133.  Prophylaxis: SCDs, lovenox on hold pending repeat am labs  Plan: NG tube removed this am Diet to clears Plan for repeat labs later this am Continue stepdown monitoring today Encourage increasing mobility today IS to the bedside Continue plan of care   LOS: 2 days    Doylene Bode 12/14/2022, 7:17 AM

## 2022-12-14 NOTE — Plan of Care (Signed)
?  Problem: Clinical Measurements: ?Goal: Will remain free from infection ?Outcome: Progressing ?  ?

## 2022-12-15 LAB — BASIC METABOLIC PANEL
Anion gap: 3 — ABNORMAL LOW (ref 5–15)
BUN: <5 mg/dL — ABNORMAL LOW (ref 8–23)
CO2: 24 mmol/L (ref 22–32)
Calcium: 7.8 mg/dL — ABNORMAL LOW (ref 8.9–10.3)
Chloride: 102 mmol/L (ref 98–111)
Creatinine, Ser: 0.39 mg/dL — ABNORMAL LOW (ref 0.44–1.00)
GFR, Estimated: >60 mL/min (ref >60)
Glucose, Bld: 159 mg/dL — ABNORMAL HIGH (ref 70–99)
Potassium: 3.6 mmol/L (ref 3.5–5.1)
Sodium: 129 mmol/L — ABNORMAL LOW (ref 135–145)

## 2022-12-15 LAB — CBC
HCT: 23.6 % — ABNORMAL LOW (ref 36.0–46.0)
HCT: 23.1 % — ABNORMAL LOW (ref 36.0–46.0)
Hemoglobin: 7.5 g/dL — ABNORMAL LOW (ref 12.0–15.0)
Hemoglobin: 7.7 g/dL — ABNORMAL LOW (ref 12.0–15.0)
MCH: 30.6 pg (ref 26.0–34.0)
MCH: 31 pg (ref 26.0–34.0)
MCHC: 31.8 g/dL (ref 30.0–36.0)
MCHC: 33.3 g/dL (ref 30.0–36.0)
MCV: 96.3 fL (ref 80.0–100.0)
MCV: 93.1 fL (ref 80.0–100.0)
Platelets: 109 10*3/uL — ABNORMAL LOW (ref 150–400)
Platelets: 131 10*3/uL — ABNORMAL LOW (ref 150–400)
RBC: 2.45 MIL/uL — ABNORMAL LOW (ref 3.87–5.11)
RBC: 2.48 MIL/uL — ABNORMAL LOW (ref 3.87–5.11)
RDW: 17.8 % — ABNORMAL HIGH (ref 11.5–15.5)
RDW: 17.7 % — ABNORMAL HIGH (ref 11.5–15.5)
WBC: 8 10*3/uL (ref 4.0–10.5)
WBC: 6 10*3/uL (ref 4.0–10.5)
nRBC: 0 % (ref 0.0–0.2)
nRBC: 0 % (ref 0.0–0.2)

## 2022-12-15 LAB — GLUCOSE, CAPILLARY: Glucose-Capillary: 129 mg/dL — ABNORMAL HIGH (ref 70–99)

## 2022-12-15 MED ORDER — KCL-LACTATED RINGERS-D5W 20 MEQ/L IV SOLN
INTRAVENOUS | Status: AC
Start: 2022-12-15 — End: 2022-12-17
  Filled 2022-12-15 (×3): qty 1000

## 2022-12-15 MED ORDER — ENOXAPARIN SODIUM 40 MG/0.4ML IJ SOSY
40.0000 mg | PREFILLED_SYRINGE | INTRAMUSCULAR | Status: DC
Start: 2022-12-15 — End: 2022-12-20
  Administered 2022-12-15 – 2022-12-19 (×5): 40 mg via SUBCUTANEOUS
  Filled 2022-12-15 (×5): qty 0.4

## 2022-12-15 MED ORDER — GUAIFENESIN ER 600 MG PO TB12
600.0000 mg | ORAL_TABLET | Freq: Two times a day (BID) | ORAL | Status: DC | PRN
Start: 2022-12-15 — End: 2022-12-21
  Administered 2022-12-15: 600 mg via ORAL
  Filled 2022-12-15: qty 1

## 2022-12-15 NOTE — Plan of Care (Signed)
°  Problem: Coping: °Goal: Level of anxiety will decrease °Outcome: Progressing °  °

## 2022-12-15 NOTE — Progress Notes (Signed)
GYN Oncology Progress Note  3 Days Post-Op Procedure(s) (LRB): DIAGNOSTIC LAPAROSCOPY (Bilateral) OMENTECTOMY (N/A) RADICAL TUMOR DEBULKING, PERITONEAL STRIPPING, MOBILIZATION SPLENIC FLEXURE, RECTOSIGMOID RESECTION,SMALL BOWEL RESECTION AND REANASTOMOSIS ,END TO END DESCENDING COLOSTOMY (N/A) OPEN BILATERAL SALPINGECTOMY-OOPHORECTOMY (Bilateral)  Subjective: Patient reports episode of nausea overnight but no vomiting.  Abdominal pain managed with as needed medications.  Does note phlegm stuck in her throat that she has been having a hard time coughing out and wishes to have some Mucinex.  Denies flatus in her ostomy.  Has not ambulated.  Tolerating water.  Objective: Vital signs in last 24 hours: Temp:  [98.3 F (36.8 C)-99.2 F (37.3 C)] 98.3 F (36.8 C) (11/16 0509) Pulse Rate:  [95-103] 95 (11/16 0509) Resp:  [8-18] 18 (11/16 0509) BP: (111-136)/(46-79) 136/71 (11/16 0509) SpO2:  [92 %-98 %] 97 % (11/16 0509) Last BM Date : 12/12/22  Intake/Output from previous day: 11/15 0701 - 11/16 0700 In: 1915.3 [P.O.:450; I.V.:1465.3] Out: 2310 [Urine:2100; Drains:210]  Physical Examination: General: alert, cooperative, and no distress Resp: clear to auscultation bilaterally Cardio: regular rate, regular in rhythm GI: incision: midline abdominal incision with staples with op site dressing in place with no drainage noted underneath, laparoscopic incision with dermabond to the left upper abdomen intact and right abdominal JP drain charged with serosanguinous drainage, ostomy to the left abdomen with applicance intact, stoma pink, serosanguinous output in bag but no stool or flatus.  Normal bowel sounds present, abdomen is soft. Extremities: extremities normal, atraumatic, no cyanosis or edema Urine in foley clear yellow.   Labs: WBC/Hgb/Hct/Plts:  8.0/7.5/23.6/109 (11/16 1914) BUN/Cr/glu/ALT/AST/amyl/lip:  <5/0.39/--/--/--/--/-- (11/16 7829)  Assessment: 77 y.o. s/p  Procedure(s): DIAGNOSTIC LAPAROSCOPY, OMENTECTOMY, RADICAL TUMOR DEBULKING, PERITONEAL STRIPPING, MOBILIZATION SPLENIC FLEXURE, RECTOSIGMOID RESECTION,SMALL BOWEL RESECTION AND REANASTOMOSIS ,END TO END DESCENDING COLOSTOMY. OPEN BILATERAL SALPINGECTOMY-OOPHORECTOMY: stable Pain:  Pain is well-controlled on PRN medications.  Heme: Hgb 7.5. Repeat CBC this afternoon. If still <8 or symptomatic, will consider blood transfusion.   ID: WBC 8.0, given decadron and intra-op IV abxs. No evidence of infection at this time.  CV: BP and HR improved. HR normal range  GI:  Tolerating po: clear liquids. S/P rectosigmoid resection, small bowel resection with reanastomosis, end to end descending colostomy. IV protonix ordered. Clears until flatus.  GU: Creatinine is 0.39 this am. Foley in place and will continue for close output monitoring.   FEN: No critical values on am labs. Na+ 129. Will change IVF  Prophylaxis: SCDs, lovenox given yesterday, on hold pending repeat labs  Plan: Mucinex for phlegm Change mIVF given hyponatremia. Continue for now given only small PO intake with water Clears until flatus Continue foley for now for close I&Os and until pt more mobile Encourage ambulation today Repeat CBC at noon. If stable, resume ppx lovenox. Will consider blood transfusion if persistently below Hgb 8 or symptomatic.  IS to the bedside Continue plan of care   LOS: 3 days    Azadeh Hyder 12/15/2022, 7:21 AM

## 2022-12-15 NOTE — Progress Notes (Signed)
Patient complaining of secretions on her throat ,patient was educated on continuous use of IS.  Patient called back after 30  mins and reported the same thing verbalizes " I can't still get it out I've been using this IS but its not working." Patient

## 2022-12-16 LAB — CBC
HCT: 24.5 % — ABNORMAL LOW (ref 36.0–46.0)
Hemoglobin: 8 g/dL — ABNORMAL LOW (ref 12.0–15.0)
MCH: 30.9 pg (ref 26.0–34.0)
MCHC: 32.7 g/dL (ref 30.0–36.0)
MCV: 94.6 fL (ref 80.0–100.0)
Platelets: 149 10*3/uL — ABNORMAL LOW (ref 150–400)
RBC: 2.59 MIL/uL — ABNORMAL LOW (ref 3.87–5.11)
RDW: 17.9 % — ABNORMAL HIGH (ref 11.5–15.5)
WBC: 5.5 10*3/uL (ref 4.0–10.5)
nRBC: 0 % (ref 0.0–0.2)

## 2022-12-16 LAB — BASIC METABOLIC PANEL
Anion gap: 8 (ref 5–15)
BUN: <5 mg/dL — ABNORMAL LOW (ref 8–23)
CO2: 24 mmol/L (ref 22–32)
Calcium: 8.8 mg/dL — ABNORMAL LOW (ref 8.9–10.3)
Chloride: 101 mmol/L (ref 98–111)
Creatinine, Ser: 0.37 mg/dL — ABNORMAL LOW (ref 0.44–1.00)
GFR, Estimated: >60 mL/min (ref >60)
Glucose, Bld: 131 mg/dL — ABNORMAL HIGH (ref 70–99)
Potassium: 3.4 mmol/L — ABNORMAL LOW (ref 3.5–5.1)
Sodium: 133 mmol/L — ABNORMAL LOW (ref 135–145)

## 2022-12-16 MED ORDER — PROCHLORPERAZINE EDISYLATE 10 MG/2ML IJ SOLN
10.0000 mg | Freq: Four times a day (QID) | INTRAMUSCULAR | Status: DC | PRN
Start: 2022-12-16 — End: 2022-12-21
  Administered 2022-12-16 – 2022-12-20 (×3): 10 mg via INTRAVENOUS
  Filled 2022-12-16 (×3): qty 2

## 2022-12-16 MED ORDER — CHEWING GUM (ORBIT) SUGAR FREE
1.0000 | CHEWING_GUM | Freq: Three times a day (TID) | ORAL | Status: DC
  Administered 2022-12-16 – 2022-12-21 (×15): 1 via ORAL
  Filled 2022-12-16: qty 1

## 2022-12-16 MED ORDER — POTASSIUM CHLORIDE CRYS ER 20 MEQ PO TBCR
20.0000 meq | EXTENDED_RELEASE_TABLET | Freq: Once | ORAL | Status: AC
Start: 2022-12-16 — End: 2022-12-16
  Administered 2022-12-16: 20 meq via ORAL
  Filled 2022-12-16: qty 1

## 2022-12-16 NOTE — Progress Notes (Signed)
Pt remains very reluctant to be OOB, in chair or even walk. Insisted that pt sit up in recliner this am - was able to tolerate for 1.5 hours, then demanded that she go back to bed. Reviewed complications of bedrest w pt, to include ileus, pneumonia and blood clots, she verbalized understanding. No gas or stool from ostomy thus far, pt vomited 200 cc's greenish material, 2nd prn med given. Taking po's fair to poorly. JP drain will not hold suction, MD notifed and JP bulb replaced but still will not hold suction. MD aware. Family at bedside.

## 2022-12-16 NOTE — Progress Notes (Signed)
GYN Oncology Progress Note  4 Days Post-Op Procedure(s) (LRB): DIAGNOSTIC LAPAROSCOPY (Bilateral) OMENTECTOMY (N/A) RADICAL TUMOR DEBULKING, PERITONEAL STRIPPING, MOBILIZATION SPLENIC FLEXURE, RECTOSIGMOID RESECTION,SMALL BOWEL RESECTION AND REANASTOMOSIS ,END TO END DESCENDING COLOSTOMY (N/A) OPEN BILATERAL SALPINGECTOMY-OOPHORECTOMY (Bilateral)  Subjective: Patient overall doing well. Pain appropriately controlled. Was able to get to the chair but did no walk yesterday. Has has some nausea controlled with meds, no vomiting. Has been trying to drink, thinks about 3 small cups of water. Didn't like the broth. Also had some tea. Phelgm improved after starting mucinex. Denies flatus, stool output from ostomy.    Objective: Vital signs in last 24 hours: Temp:  [98 F (36.7 C)-98.8 F (37.1 C)] 98 F (36.7 C) (11/17 0521) Pulse Rate:  [82-100] 89 (11/17 0521) Resp:  [16-20] 18 (11/17 0521) BP: (124-138)/(60-65) 138/65 (11/17 0521) SpO2:  [94 %-96 %] 94 % (11/17 0521) Last BM Date : 12/12/22  Intake/Output from previous day: 11/16 0701 - 11/17 0700 In: 2806.1 [P.O.:720; I.V.:2076.1] Out: 4676 [Urine:4625; Drains:45; Stool:6]  Physical Examination: General: alert, cooperative, and no distress Resp: clear to auscultation bilaterally Cardio: regular rate, regular in rhythm GI: incision: midline abdominal incision with staples with op site dressing in place with no drainage noted underneath, laparoscopic incision with dermabond to the left upper abdomen intact and right abdominal JP drain charged with serosanguinous drainage, ostomy to the left abdomen with applicance intact, stoma pink, serosanguinous output in bag but no stool or flatus.  Normal bowel sounds present, abdomen is soft. Extremities: extremities normal, atraumatic, no cyanosis or edema. SCDs in place. Urine in foley clear yellow.   Labs: WBC/Hgb/Hct/Plts:  5.5/8.0/24.5/149 (11/17 1610) BUN/Cr/glu/ALT/AST/amyl/lip:   <5/0.37/--/--/--/--/-- (11/17 9604)  Assessment: 77 y.o. s/p Procedure(s): DIAGNOSTIC LAPAROSCOPY, OMENTECTOMY, RADICAL TUMOR DEBULKING, PERITONEAL STRIPPING, MOBILIZATION SPLENIC FLEXURE, RECTOSIGMOID RESECTION,SMALL BOWEL RESECTION AND REANASTOMOSIS ,END TO END DESCENDING COLOSTOMY. OPEN BILATERAL SALPINGECTOMY-OOPHORECTOMY: stable Pain:  Pain is well-controlled on PRN medications.  Heme: Hgb 8. Stable. Asymptomatic. If symptomatic when ambulates, will consider blood transfusion.   ID: WBC 5.5, given decadron and intra-op IV abxs. No evidence of infection at this time.  CV: BP and HR improved. HR normal range  GI:  Tolerating po: clear liquids. S/P rectosigmoid resection, small bowel resection with reanastomosis, end to end descending colostomy. IV protonix ordered. Clears until flatus.  GU: Creatinine is 0.37 this am. Excellent urine output. Will plan for foley removal this afternoon once patient has been able to get up out of bed.    FEN: No critical values on am labs. K3.4 Na+ 133. Decrease IVF rate given excellent UOP, taking in small PO clears.   Prophylaxis: SCDs, lovenox  Plan: Decrease mIVF given excellent uop, some PO intake. Encourage increased PO intake. Potassium chloride table for hypokalemia and K in IVF. Clears until flatus. Foley removal this afternoon once pt is able to get up out of bed. Encourage ambulation Lovenox resumed given stable Hgb.  IS to the bedside Continue plan of care   LOS: 4 days    Kohei Antonellis 12/16/2022, 7:02 AM

## 2022-12-16 NOTE — Plan of Care (Signed)
  Problem: Education: Goal: Knowledge of General Education information will improve Description: Including pain rating scale, medication(s)/side effects and non-pharmacologic comfort measures Outcome: Progressing   Problem: Health Behavior/Discharge Planning: Goal: Ability to manage health-related needs will improve Outcome: Progressing   Problem: Clinical Measurements: Goal: Ability to maintain clinical measurements within normal limits will improve Outcome: Progressing Goal: Will remain free from infection Outcome: Progressing Goal: Diagnostic test results will improve Outcome: Progressing Goal: Respiratory complications will improve Outcome: Progressing Goal: Cardiovascular complication will be avoided Outcome: Progressing   Problem: Activity: Goal: Risk for activity intolerance will decrease Outcome: Progressing   Problem: Nutrition: Goal: Adequate nutrition will be maintained Outcome: Progressing   Problem: Coping: Goal: Level of anxiety will decrease Outcome: Progressing   Problem: Elimination: Goal: Will not experience complications related to bowel motility Outcome: Progressing Goal: Will not experience complications related to urinary retention Outcome: Progressing   Problem: Pain Management: Goal: General experience of comfort will improve Outcome: Progressing   Problem: Safety: Goal: Ability to remain free from injury will improve Outcome: Progressing   Problem: Skin Integrity: Goal: Risk for impaired skin integrity will decrease Outcome: Progressing   Problem: Education: Goal: Knowledge of the prescribed therapeutic regimen will improve Outcome: Progressing Goal: Understanding of sexual limitations or changes related to disease process or condition will improve Outcome: Progressing Goal: Individualized Educational Video(s) Outcome: Progressing   Problem: Self-Concept: Goal: Communication of feelings regarding changes in body function or  appearance will improve Outcome: Progressing   Problem: Skin Integrity: Goal: Demonstration of wound healing without infection will improve Outcome: Progressing

## 2022-12-17 LAB — CBC
HCT: 19.2 % — ABNORMAL LOW (ref 36.0–46.0)
HCT: 26.1 % — ABNORMAL LOW (ref 36.0–46.0)
Hemoglobin: 6.2 g/dL — CL (ref 12.0–15.0)
Hemoglobin: 8.4 g/dL — ABNORMAL LOW (ref 12.0–15.0)
MCH: 31.3 pg (ref 26.0–34.0)
MCH: 30.5 pg (ref 26.0–34.0)
MCHC: 32.3 g/dL (ref 30.0–36.0)
MCHC: 32.2 g/dL (ref 30.0–36.0)
MCV: 97 fL (ref 80.0–100.0)
MCV: 94.9 fL (ref 80.0–100.0)
Platelets: 143 10*3/uL — ABNORMAL LOW (ref 150–400)
Platelets: 201 10*3/uL (ref 150–400)
RBC: 1.98 MIL/uL — ABNORMAL LOW (ref 3.87–5.11)
RBC: 2.75 MIL/uL — ABNORMAL LOW (ref 3.87–5.11)
RDW: 18.3 % — ABNORMAL HIGH (ref 11.5–15.5)
RDW: 17.7 % — ABNORMAL HIGH (ref 11.5–15.5)
WBC: 4 10*3/uL (ref 4.0–10.5)
WBC: 6.1 10*3/uL (ref 4.0–10.5)
nRBC: 0 % (ref 0.0–0.2)
nRBC: 0 % (ref 0.0–0.2)

## 2022-12-17 LAB — BASIC METABOLIC PANEL
Anion gap: 8 (ref 5–15)
BUN: 6 mg/dL — ABNORMAL LOW (ref 8–23)
CO2: 22 mmol/L (ref 22–32)
Calcium: 8.4 mg/dL — ABNORMAL LOW (ref 8.9–10.3)
Chloride: 105 mmol/L (ref 98–111)
Creatinine, Ser: 0.39 mg/dL — ABNORMAL LOW (ref 0.44–1.00)
GFR, Estimated: >60 mL/min (ref >60)
Glucose, Bld: 110 mg/dL — ABNORMAL HIGH (ref 70–99)
Potassium: 3.4 mmol/L — ABNORMAL LOW (ref 3.5–5.1)
Sodium: 135 mmol/L (ref 135–145)

## 2022-12-17 MED ORDER — POLYETHYLENE GLYCOL 3350 17 G PO PACK
17.0000 g | PACK | Freq: Every day | ORAL | Status: DC
Start: 2022-12-17 — End: 2022-12-21
  Administered 2022-12-17 – 2022-12-21 (×5): 17 g via ORAL
  Filled 2022-12-17 (×5): qty 1

## 2022-12-17 MED ORDER — ORAL CARE MOUTH RINSE: 15.0000 mL | OROMUCOSAL | Status: DC | PRN

## 2022-12-17 MED ORDER — KCL-LACTATED RINGERS-D5W 20 MEQ/L IV SOLN
INTRAVENOUS | Status: DC
  Filled 2022-12-17: qty 1000

## 2022-12-17 NOTE — Evaluation (Addendum)
Physical Therapy Evaluation Patient Details Name: Rachel Carlson MRN: [id number] DOB: 22-Jan-1946 Today's Date: 12/17/2022  History of Present Illness  Pt is 77 yo female admitted on 12/12/22 for  Diagnostic laparoscopy, conversion to exploratory laparotomy with radical tumor debulking including total omentectomy, resection of peritoneal disease, small bowel resection with 3 anastomosis, en bloc resection of the left fallopian tube and ovary as well as rectosigmoid colon, left ureterolysis, right salpingo-oophorectomy, end descending colostomy, cystoscopy.  Pt with hx inclduing but not limited to HLD, DVT, GERD, uterine prolapse  Clinical Impression  Pt admitted with above diagnosis. At baseline she is independent.  Her home is accessible and she has all necessary DME -even hospital bed.  Today, pt was supervision for transfers and to ambulate 400' with RW.  She demonstrated good balance, safety, motivation, and pain control.  Pt has no further acute PT needs.  No immediate PT needs at d/c.  Did discuss could pursue outpt PT in future if having difficulty returning to baseline, ongoing pain, or core weakness.        If plan is discharge home, recommend the following: A little help with walking and/or transfers;A little help with bathing/dressing/bathroom;Assistance with cooking/housework   Can travel by private vehicle        Equipment Recommendations None recommended by PT  Recommendations for Other Services       Functional Status Assessment Patient has not had a recent decline in their functional status     Precautions / Restrictions Precautions Precautions: Fall Precaution Comments: colostomy, JP drain      Mobility  Bed Mobility Overal bed mobility: Needs Assistance Bed Mobility: Supine to Sit, Sit to Supine     Supine to sit: Supervision, HOB elevated, Used rails Sit to supine: Supervision, HOB elevated   General bed mobility comments: Educated on transfer  techniques, use of rail and bed features as needed, pt performed partial log roll technique    Transfers Overall transfer level: Needs assistance Equipment used: Rolling walker (2 wheels) Transfers: Sit to/from Stand Sit to Stand: Supervision           General transfer comment: from bed and toilet    Ambulation/Gait Ambulation/Gait assistance: Supervision Gait Distance (Feet): 400 Feet Assistive device: Rolling walker (2 wheels) Gait Pattern/deviations: Step-through pattern Gait velocity: decreased from baseline but functional     General Gait Details: steady gait  Stairs            Wheelchair Mobility     Tilt Bed    Modified Rankin (Stroke Patients Only)       Balance Overall balance assessment: Needs assistance Sitting-balance support: No upper extremity supported Sitting balance-Leahy Scale: Good     Standing balance support: No upper extremity supported Standing balance-Leahy Scale: Good Standing balance comment: RW to ambulate but ADLs and a few steps wtihout AD                             Pertinent Vitals/Pain Pain Assessment Pain Assessment: 0-10 Pain Score:  (0/10 rest, 5/10 transfers) Pain Descriptors / Indicators: Burning, Discomfort Pain Intervention(s): Limited activity within patient's tolerance, Monitored during session, Repositioned    Home Living Family/patient expects to be discharged to:: Private residence Living Arrangements: Spouse/significant other;Children (son who is in 64's) Available Help at Discharge: Family;Available 24 hours/day Type of Home: House Home Access: Ramped entrance       Home Layout: One level Home Equipment: Shower seat -  built in;Grab bars - tub/shower;Grab bars - toilet;Rolling Walker (2 wheels);Cane - single point;Rollator (4 wheels);Hospital bed Additional Comments: Reports home accessible as spouse is handicap; reports has hospital bed with rails and trapeze that she can use    Prior  Function Prior Level of Function : Independent/Modified Independent;Driving             Mobility Comments: Could ambulate without AD in community ADLs Comments: independent with adls and iadls; spouse is disabled (she doesn't have to physically strain other than helping with compression socks and aware she will not be able to at d/c)     Extremity/Trunk Assessment   Upper Extremity Assessment Upper Extremity Assessment: Overall WFL for tasks assessed    Lower Extremity Assessment Lower Extremity Assessment: Overall WFL for tasks assessed    Cervical / Trunk Assessment Cervical / Trunk Assessment: Normal  Communication      Cognition Arousal: Alert Behavior During Therapy: WFL for tasks assessed/performed Overall Cognitive Status: Within Functional Limits for tasks assessed                                          General Comments General comments (skin integrity, edema, etc.): VSS Educated on positions of comfort for rest and transfer techniques.  Educated on precautions of no heavy lifting or straining.     Exercises     Assessment/Plan    PT Assessment Patient does not need any further PT services  PT Problem List         PT Treatment Interventions      PT Goals (Current goals can be found in the Care Plan section)  Acute Rehab PT Goals Patient Stated Goal: return home PT Goal Formulation: All assessment and education complete, DC therapy    Frequency       Co-evaluation               AM-PAC PT "6 Clicks" Mobility  Outcome Measure Help needed turning from your back to your side while in a flat bed without using bedrails?: A Little Help needed moving from lying on your back to sitting on the side of a flat bed without using bedrails?: A Little Help needed moving to and from a bed to a chair (including a wheelchair)?: A Little Help needed standing up from a chair using your arms (e.g., wheelchair or bedside chair)?: A Little Help  needed to walk in hospital room?: A Little Help needed climbing 3-5 steps with a railing? : A Little 6 Click Score: 18    End of Session   Activity Tolerance: Patient tolerated treatment well Patient left: in bed;with call bell/phone within reach;with bed alarm set;with SCD's reapplied Nurse Communication: Mobility status PT Visit Diagnosis: Other abnormalities of gait and mobility (R26.89)    Time: 9562-1308 PT Time Calculation (min) (ACUTE ONLY): 21 min   Charges:   PT Evaluation $PT Eval Low Complexity: 1 Low   PT General Charges $$ ACUTE PT VISIT: 1 Visit         Anise Salvo, PT Acute Rehab Services [facility] [phone]   Rayetta Humphrey 12/17/2022, 5:54 PM

## 2022-12-17 NOTE — Progress Notes (Signed)
GYN Oncology Progress Note  5 Days Post-Op Procedure(s) (LRB): DIAGNOSTIC LAPAROSCOPY (Bilateral) OMENTECTOMY (N/A) RADICAL TUMOR DEBULKING, PERITONEAL STRIPPING, MOBILIZATION SPLENIC FLEXURE, RECTOSIGMOID RESECTION,SMALL BOWEL RESECTION AND REANASTOMOSIS ,END TO END DESCENDING COLOSTOMY (N/A) OPEN BILATERAL SALPINGECTOMY-OOPHORECTOMY (Bilateral)  Subjective: Patient reports more pain in her lower abdomen this am. Had an episode of emesis yesterday after taking medications. The patient reports having a pill stuck on her tonsil which contributed to nausea and emesis episode. No nausea this am but has not been taking in significant amounts. She has been sitting up in the chair. She ambulated down the hall yesterday. Denies lightheadedness when up moving around. Reports voiding adequate amounts with around 300 cc of urine measured each void. No concerns voiced at this time.    Objective: Vital signs in last 24 hours: Temp:  [98.5 F (36.9 C)-99 F (37.2 C)] 98.5 F (36.9 C) (11/18 0511) Pulse Rate:  [93-102] 93 (11/18 0511) Resp:  [16-18] 18 (11/18 0511) BP: (115-126)/(60-69) 118/60 (11/18 0511) SpO2:  [95 %-96 %] 96 % (11/18 0511) Last BM Date : 12/12/22  Intake/Output from previous day: 11/17 0701 - 11/18 0700 In: 1988.9 [P.O.:780; I.V.:1198.9] Out: 2560 [Urine:2200; Emesis/NG output:200; Drains:155; Stool:5]  Physical Examination (performed by Dr. Pricilla Holm): General: alert, cooperative, and no distress Resp: clear to auscultation bilaterally Cardio: regular in rhythm and rate GI: incision: midline abdominal incision with staples with op site dressing in place with no drainage noted underneath, laparoscopic incision with dermabond to the left upper abdomen intact and right abdominal JP drain slightly charged with serous drainage, ostomy to the left abdomen with applicance intact, stoma pink, minimal dark serosanguinous output in bag. Active abdominal bowel sounds, abdomen is  soft. Extremities: extremities normal, atraumatic, no cyanosis or edema  Labs: WBC/Hgb/Hct/Plts:  4.0/6.2/19.2/143 (11/18 0430) BUN/Cr/glu/ALT/AST/amyl/lip:  6/0.39/--/--/--/--/-- (11/18 0430)  Assessment: 77 y.o. s/p Procedure(s): DIAGNOSTIC LAPAROSCOPY, OMENTECTOMY, RADICAL TUMOR DEBULKING, PERITONEAL STRIPPING, MOBILIZATION SPLENIC FLEXURE, RECTOSIGMOID RESECTION,SMALL BOWEL RESECTION AND REANASTOMOSIS ,END TO END DESCENDING COLOSTOMY. OPEN BILATERAL SALPINGECTOMY-OOPHORECTOMY: stable Pain:  Pain is well-controlled on PRN medications.  Heme: Hgb 6.2 and Hct 19.2 this am. Plan for repeat labs later this am.  ID: WBC 4.0 this am. Given decadron and intra-op IV abxs. No evidence of infection at this time.  CV: BP and HR stable. Tachycardia improved.   GI:  Tolerating po: sips of liquids. Maintain IVF. S/P rectosigmoid resection, small bowel resection with reanastomosis, end to end descending colostomy. IV protonix ordered.  GU: Creatinine is 0.39 this am. Adequate output reported.   FEN: No critical values on am labs. Na+ 135, K+ 3.4.  Prophylaxis: SCDs, lovenox   Plan: Repeat CBC to follow up on Hgb 6.2 Maintain IVF  Continue sips of clears Encourage increasing mobility IS to the bedside Continue plan of care   LOS: 5 days    Doylene Bode 12/17/2022, 8:20 AM

## 2022-12-17 NOTE — Progress Notes (Signed)
Dr Alvester Morin was paged about pt's current hgb of 6.2. Per her inquiry, updated her on the color of the JP drainage being mostly serous in color and leaking at the insertion site- JP bulb will not keep suction and dressing has had to be changed. Also, per her inquiry, informed her that pt's vital signs have been stable. No orders received at present time.

## 2022-12-17 NOTE — TOC Initial Note (Signed)
Transition of Care [facility]) - Initial/Assessment Note   Patient Details  Name: Rachel Carlson MRN: [id number] Date of Birth: 10-Oct-1945  Transition of Care [facility]) CM/SW Contact:    Ewing Schlein, LCSW Phone Number: 12/17/2022, 2:56 PM  Clinical Narrative: Patient has a new ostomy and will need HHRN after discharge. Patient agreeable to [facility] referral and does not have agency preference. CSW made [facility] referral to [facility] with [facility], which was accepted. CSW updated patient.                 Expected Discharge Plan: Home w Home Health Services Barriers to Discharge: Continued Medical Work up  Patient Goals and CMS Choice Patient states their goals for this hospitalization and ongoing recovery are:: Return home CMS Medicare.gov Compare Post Acute Care list provided to:: Patient Choice offered to / list presented to : Patient  Expected Discharge Plan and Services In-house Referral: Clinical Social Work Post Acute Care Choice: Home Health Living arrangements for the past 2 months: Single Family Home            DME Arranged: N/A DME Agency: NA HH Arranged: RN HH Agency: [facility] ([facility]) Date HH Agency Contacted: 12/17/22 Time HH Agency Contacted: 1012 Representative spoke with at [facility] Agency: Morrie Sheldon  Prior Living Arrangements/Services Living arrangements for the past 2 months: Single Family Home Lives with:: Spouse Patient language and need for interpreter reviewed:: Yes Do you feel safe going back to the place where you live?: Yes      Need for Family Participation in Patient Care: No (Comment) Care giver support system in place?: Yes (comment) Criminal Activity/Legal Involvement Pertinent to Current Situation/Hospitalization: No - Comment as needed  Activities of Daily Living ADL Screening (condition at time of admission) Independently performs ADLs?: Yes (appropriate for developmental age) Is the patient deaf or have difficulty hearing?: No Does the patient have  difficulty seeing, even when wearing glasses/contacts?: No Does the patient have difficulty concentrating, remembering, or making decisions?: No  Permission Sought/Granted Permission sought to share information with : Other (comment) Permission granted to share information with : Yes, Verbal Permission Granted Permission granted to share info w AGENCY: HH agencies  Emotional Assessment Attitude/Demeanor/Rapport: Engaged Affect (typically observed): Accepting Orientation: : Oriented to Self, Oriented to Place, Oriented to  Time, Oriented to Situation Alcohol / Substance Use: Not Applicable Psych Involvement: No (comment)  Admission diagnosis:  Gynecologic malignancy [facility]) [C57.9] Patient Active Problem List   Diagnosis Date Noted   Gynecologic malignancy (HCC) 12/12/2022   Iron deficiency anemia 10/25/2022   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   Elevated tumor markers 08/31/2022   DVT, lower extremity, distal (HCC) 08/01/2022   Ascites 07/27/2022   Left leg swelling 07/23/2022   Swelling abdomen 07/23/2022   History of vaginal bleeding 08/17/2021   Nonrheumatic mitral valve regurgitation 04/16/2019   Essential hypertension 04/11/2018   PVC's (premature ventricular contractions) 04/11/2018   Class 1 obesity due to excess calories with serious comorbidity and body mass index (BMI) of 34.0 to 34.9 in adult 12/06/2017   Genetic testing 09/26/2017   Family history of breast cancer    Family history of colon cancer    Family history of prostate cancer    Family history of melanoma    Family history of Lynch syndrome - son 08/04/2017   Prediabetes 07/20/2015   Estrogen deficiency 04/27/2015   GERD (gastroesophageal reflux disease) 03/23/2015   Hx of adenomatous polyp of colon 10/19/2014   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   Pain in joint, pelvic region and thigh 07/24/2014   Need for hepatitis C screening test 07/12/2014   Pelvic pain in female  07/12/2014   Vitamin D deficiency 07/03/2014   Encounter for Medicare annual wellness exam 07/08/2013   Osteoporosis 05/24/2013   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 [place] [address] Phone: [phone] Fax: [phone]  Social Determinants of Health (SDOH) Social History: SDOH Screenings   Food Insecurity: No Food Insecurity (12/12/2022)  Housing: Low Risk  (12/12/2022)  Transportation Needs: No Transportation Needs (12/12/2022)  Utilities: Not At Risk (12/12/2022)  Alcohol Screen: Low Risk  (08/28/2022)  Depression (PHQ2-9): Low Risk  (08/28/2022)  Financial Resource Strain: Low Risk  (08/28/2022)  Physical Activity: Inactive (08/28/2022)  Social Connections: Moderately Integrated (08/28/2022)  Stress: No Stress Concern Present (08/28/2022)  Tobacco Use: Low Risk  (12/12/2022)  Health Literacy: Adequate Health Literacy (08/28/2022)   SDOH Interventions:    Readmission Risk Interventions     No data to display

## 2022-12-17 NOTE — Consult Note (Addendum)
WOC Nurse ostomy follow up; patient sitting up in chair engaged to learn for the first time since surgery  Stoma type/location: LLQ colostomy  Stomal assessment/size:  1 3/8" round, pink and moist, slightly above skin level  Peristomal assessment:  intact, some bruising to abdomen noted, an intact puncture site is at 11 o'clock, midline incision with staples to R of ostomy, does not interfere with pouching  Treatment options for stomal/peristomal skin: no sting skin prep; 2" barrier ring  Output  patient with minimal bloody effluent in pouch, has had no ostomy output, receiving Miralax at time of this visit  Ostomy pouching: 2 piece 2 1/4" skin barrier and pouch applied today; will likely change back to 1 1/2" 1 piece convex at next visit  Education provided: Discussed with patient emptying pouch when 1/3 to 1/2 full.  Patient is able to open pouch using lock and roll mechanism, clean spout with toilet paper wick and close pouch.  We discussed changing entire pouching system 2 times a week or as needed for leaking. This RN cut away pouch loose from honey comb dressing.  Removed pouch using push pull method revealing a pink moist slightly above skin level stoma.  Stoma covered with mucous, not all able to be removed.  We discussed some bleeding with cleaning a normal finding.  We sized stoma at 1 3/8" round today.  Discussed that stoma will likely change in size some over the next 1-2 months. Recommended sizing with each change for first month.  We discussed pre cut skin barriers once changes subside (patient was interested in this).  We discussed cleaning around stoma with water moistened washcloth only, no baby wipes or lotions.  I demonstrated to patient how to cut skin barrier 1 3/8" round.  I stretched 2" barrier ring to fit snugly around stoma.  We placed new skin barrier on 2" barrier ring and snapped pouch to skin barrier. Patient closed new pouch.    Patient lives at home with her husband but feels  he does not have the manual dexterity to assist with pouch changes. Patient appears completely able to perform independently.  Will need hand held mirror at next visit to assist with visualization.   We discussed emptying in toilet at home  We also discussed bathing with pouch on or off.  Reviewed all educational materials with patient including step by step instructions on applying a 1 piece and 2 piece pouch.     Enrolled patient in [employer] Start Discharge program: Yes on previous visit.   WOC team will continue to follow patient for ostomy support and education.   Thank you,    Priscella Mann MSN, RN-BC, [employer] [phone]

## 2022-12-17 NOTE — Plan of Care (Signed)
  Problem: Education: Goal: Knowledge of General Education information will improve Description: Including pain rating scale, medication(s)/side effects and non-pharmacologic comfort measures Outcome: Progressing   Problem: Health Behavior/Discharge Planning: Goal: Ability to manage health-related needs will improve Outcome: Progressing   Problem: Clinical Measurements: Goal: Respiratory complications will improve Outcome: Progressing   Problem: Nutrition: Goal: Adequate nutrition will be maintained Outcome: Progressing   Problem: Coping: Goal: Level of anxiety will decrease Outcome: Progressing   Problem: Elimination: Goal: Will not experience complications related to bowel motility Outcome: Progressing Goal: Will not experience complications related to urinary retention Outcome: Progressing   Problem: Pain Management: Goal: General experience of comfort will improve Outcome: Progressing   Problem: Safety: Goal: Ability to remain free from injury will improve Outcome: Progressing

## 2022-12-17 NOTE — Progress Notes (Signed)
GYN Oncology Progress Note  Patient doing well. Repeat CBC stable with previous values (not including today's am lab- could have been dilutional if drawn from port.) Small amount of flatus in ostomy bag. Plan for diet advancement as tolerated to soft diet per Dr. Alvester Morin.   When assessing the JP drain, there was a cut in the tubing close to the insertion site. Pink tape applied and JP able to be charged. Continue plan of care.

## 2022-12-17 NOTE — Anesthesia Postprocedure Evaluation (Signed)
Anesthesia Post Note  Patient: Rachel Carlson  Procedure(s) Performed: DIAGNOSTIC LAPAROSCOPY (Bilateral) OMENTECTOMY RADICAL TUMOR DEBULKING, PERITONEAL STRIPPING, MOBILIZATION SPLENIC FLEXURE, RECTOSIGMOID RESECTION,SMALL BOWEL RESECTION AND REANASTOMOSIS ,END TO END DESCENDING COLOSTOMY OPEN BILATERAL SALPINGECTOMY-OOPHORECTOMY (Bilateral)     Patient location during evaluation: PACU Anesthesia Type: General Level of consciousness: awake and patient cooperative Pain management: pain level controlled Vital Signs Assessment: post-procedure vital signs reviewed and stable Respiratory status: spontaneous breathing, nonlabored ventilation, respiratory function stable and patient connected to nasal cannula oxygen Cardiovascular status: blood pressure returned to baseline and stable Postop Assessment: no apparent nausea or vomiting Anesthetic complications: no   No notable events documented.        Piero Mustard

## 2022-12-18 LAB — CBC
HCT: 24.4 % — ABNORMAL LOW (ref 36.0–46.0)
Hemoglobin: 7.8 g/dL — ABNORMAL LOW (ref 12.0–15.0)
MCH: 30.6 pg (ref 26.0–34.0)
MCHC: 32 g/dL (ref 30.0–36.0)
MCV: 95.7 fL (ref 80.0–100.0)
Platelets: 205 10*3/uL (ref 150–400)
RBC: 2.55 MIL/uL — ABNORMAL LOW (ref 3.87–5.11)
RDW: 18.2 % — ABNORMAL HIGH (ref 11.5–15.5)
WBC: 5.9 10*3/uL (ref 4.0–10.5)
nRBC: 0 % (ref 0.0–0.2)

## 2022-12-18 LAB — BASIC METABOLIC PANEL
Anion gap: 7 (ref 5–15)
BUN: 7 mg/dL — ABNORMAL LOW (ref 8–23)
CO2: 24 mmol/L (ref 22–32)
Calcium: 8.6 mg/dL — ABNORMAL LOW (ref 8.9–10.3)
Chloride: 103 mmol/L (ref 98–111)
Creatinine, Ser: 0.39 mg/dL — ABNORMAL LOW (ref 0.44–1.00)
GFR, Estimated: >60 mL/min (ref >60)
Glucose, Bld: 129 mg/dL — ABNORMAL HIGH (ref 70–99)
Potassium: 3.5 mmol/L (ref 3.5–5.1)
Sodium: 134 mmol/L — ABNORMAL LOW (ref 135–145)

## 2022-12-18 NOTE — Progress Notes (Signed)
Mobility Specialist - Progress Note   12/18/22 0902  Mobility  Activity Ambulated with assistance in hallway  Level of Assistance Modified independent, requires aide device or extra time  Assistive Device Front wheel walker  Distance Ambulated (ft) 1000 ft  Range of Motion/Exercises Active  Activity Response Tolerated well  Mobility Referral Yes  $Mobility charge 1 Mobility  Mobility Specialist Start Time (ACUTE ONLY) 0840  Mobility Specialist Stop Time (ACUTE ONLY) 0902  Mobility Specialist Time Calculation (min) (ACUTE ONLY) 22 min   Pt was found in bed and agreeable to ambulate. No complaints with session. At EOS returned to bed with all needs met. Call bell in reach.  Billey Chang Mobility Specialist

## 2022-12-18 NOTE — TOC Progression Note (Signed)
Transition of Care [facility]) - Progression Note   Patient Details  Name: Rachel Carlson MRN: [id number] Date of Birth: 09-15-1945  Transition [facility]) CM/SW Contact  Ewing Schlein, LCSW Phone Number: 12/18/2022, 1:55 PM  Clinical Narrative: HHRN orders placed. CSW updated Morrie Sheldon with [facility].  Expected Discharge Plan: Home w Home Health Services Barriers to Discharge: Continued Medical Work up  Expected Discharge Plan and Services In-house Referral: Clinical Social Work Post Acute Care Choice: Home Health Living arrangements for the past 2 months: Single Family Home              DME Arranged: N/A DME Agency: NA HH Arranged: RN HH Agency: [facility] ([facility]) Date HH Agency Contacted: 12/17/22 Time HH Agency Contacted: 1012 Representative spoke with at [facility] Agency: Morrie Sheldon  Social Determinants of Health (SDOH) Interventions SDOH Screenings   Food Insecurity: No Food Insecurity (12/12/2022)  Housing: Low Risk  (12/12/2022)  Transportation Needs: No Transportation Needs (12/12/2022)  Utilities: Not At Risk (12/12/2022)  Alcohol Screen: Low Risk  (08/28/2022)  Depression (PHQ2-9): Low Risk  (08/28/2022)  Financial Resource Strain: Low Risk  (08/28/2022)  Physical Activity: Inactive (08/28/2022)  Social Connections: Moderately Integrated (08/28/2022)  Stress: No Stress Concern Present (08/28/2022)  Tobacco Use: Low Risk  (12/12/2022)  Health Literacy: Adequate Health Literacy (08/28/2022)   Readmission Risk Interventions     No data to display

## 2022-12-18 NOTE — Progress Notes (Signed)
GYN Oncology Progress Note  6 Days Post-Op Procedure(s) (LRB): DIAGNOSTIC LAPAROSCOPY (Bilateral) OMENTECTOMY (N/A) RADICAL TUMOR DEBULKING, PERITONEAL STRIPPING, MOBILIZATION SPLENIC FLEXURE, RECTOSIGMOID RESECTION,SMALL BOWEL RESECTION AND REANASTOMOSIS ,END TO END DESCENDING COLOSTOMY (N/A) OPEN BILATERAL SALPINGECTOMY-OOPHORECTOMY (Bilateral)  Subjective: Patient reports doing well this am. Abdominal pain is managed and patient has not had to take narcotics frequently. She is tolerating soft diet with no nausea or emesis. She is having flatus in the ostomy. No stool at this time. JP drain has stayed charged and is no longer losing suction. She sat in the chair and has been ambulating in the halls. Voiding without difficulty. Asking for copy of pathology report. All questions answered. No concerns voiced at this time. Denies chest pain, dyspnea.   Objective: Vital signs in last 24 hours: Temp:  [98.3 F (36.8 C)-98.7 F (37.1 C)] 98.3 F (36.8 C) (11/19 0517) Pulse Rate:  [95-99] 95 (11/19 0517) Resp:  [16-18] 18 (11/19 0517) BP: (100-122)/(57-80) 122/63 (11/19 0517) SpO2:  [94 %-99 %] 94 % (11/19 0517) Last BM Date : 12/12/22  Intake/Output from previous day: 11/18 0701 - 11/19 0700 In: 1558.4 [P.O.:1320; I.V.:238.4] Out: 1105 [Urine:1100; Drains:5]  Physical Examination (performed by Dr. Pricilla Holm): General: alert, cooperative, and no distress Resp: clear to auscultation bilaterally Cardio: regular in rhythm and rate GI: incision: midline abdominal incision with staples with op site dressing in place with no drainage noted underneath, laparoscopic incision with dermabond to the left upper abdomen intact and right abdominal JP drain charged with minimal amount of serous drainage, ostomy to the left abdomen with applicance intact, stoma pink, flatus in the bag. Active abdominal bowel sounds, abdomen is soft. Extremities: extremities normal, atraumatic, no cyanosis or  edema  Labs: WBC/Hgb/Hct/Plts:  5.9/7.8/24.4/205 (11/19 4166) BUN/Cr/glu/ALT/AST/amyl/lip:  7/0.39/--/--/--/--/-- (11/19 0630)  Assessment: 77 y.o. s/p Procedure(s): DIAGNOSTIC LAPAROSCOPY, OMENTECTOMY, RADICAL TUMOR DEBULKING, PERITONEAL STRIPPING, MOBILIZATION SPLENIC FLEXURE, RECTOSIGMOID RESECTION,SMALL BOWEL RESECTION AND REANASTOMOSIS ,END TO END DESCENDING COLOSTOMY. OPEN BILATERAL SALPINGECTOMY-OOPHORECTOMY: stable Pain:  Pain is well-controlled on PRN medications.  Heme: Hgb 7.8 and Hct 24.4 this am-overall stable.  ID: WBC 5.9 this am. Given decadron and intra-op IV abxs. No evidence of infection at this time, afebrile.  CV: BP and HR stable. Tachycardia improved.   GI:  Tolerating po: yes. Antiemetics ordered PRN. S/P rectosigmoid resection, small bowel resection with reanastomosis, end to end descending colostomy. IV protonix ordered.  GU: Creatinine is 0.39 this am. Adequate output reported.   FEN: No critical values on am labs. Na+ 134, K+ 3.5.  Prophylaxis: SCDs, lovenox   Plan: HH orders placed for ostomy teaching/care Continue with soft diet. +flatus, no stool in ostomy at this time Encourage increasing mobility Continue plan of care   LOS: 6 days    Doylene Bode 12/18/2022, 8:37 AM

## 2022-12-19 ENCOUNTER — Inpatient Hospital Stay: Payer: Medicare Other | Admitting: Gynecologic Oncology

## 2022-12-19 ENCOUNTER — Encounter: Payer: Self-pay | Admitting: Oncology

## 2022-12-19 LAB — CBC
HCT: 24.4 % — ABNORMAL LOW (ref 36.0–46.0)
Hemoglobin: 7.7 g/dL — ABNORMAL LOW (ref 12.0–15.0)
MCH: 30.3 pg (ref 26.0–34.0)
MCHC: 31.6 g/dL (ref 30.0–36.0)
MCV: 96.1 fL (ref 80.0–100.0)
Platelets: 246 10*3/uL (ref 150–400)
RBC: 2.54 MIL/uL — ABNORMAL LOW (ref 3.87–5.11)
RDW: 18.2 % — ABNORMAL HIGH (ref 11.5–15.5)
WBC: 5.8 10*3/uL (ref 4.0–10.5)
nRBC: 0 % (ref 0.0–0.2)

## 2022-12-19 LAB — BASIC METABOLIC PANEL
Anion gap: 6 (ref 5–15)
BUN: 10 mg/dL (ref 8–23)
CO2: 26 mmol/L (ref 22–32)
Calcium: 8.5 mg/dL — ABNORMAL LOW (ref 8.9–10.3)
Chloride: 103 mmol/L (ref 98–111)
Creatinine, Ser: 0.45 mg/dL (ref 0.44–1.00)
GFR, Estimated: >60 mL/min (ref >60)
Glucose, Bld: 119 mg/dL — ABNORMAL HIGH (ref 70–99)
Potassium: 3.5 mmol/L (ref 3.5–5.1)
Sodium: 135 mmol/L (ref 135–145)

## 2022-12-19 NOTE — Progress Notes (Signed)
GYN Oncology Progress Note  7 Days Post-Op Procedure(s) (LRB): DIAGNOSTIC LAPAROSCOPY (Bilateral) OMENTECTOMY (N/A) RADICAL TUMOR DEBULKING, PERITONEAL STRIPPING, MOBILIZATION SPLENIC FLEXURE, RECTOSIGMOID RESECTION,SMALL BOWEL RESECTION AND REANASTOMOSIS ,END TO END DESCENDING COLOSTOMY (N/A) OPEN BILATERAL SALPINGECTOMY-OOPHORECTOMY (Bilateral)  Subjective: Patient reports doing well this am. Had nausea and pain mid day yesterday. Didn't eat lunch due to this but did tolerate mac and cheese in the pm. Abdominal pain is managed with oral medications. No emesis. She is having flatus and small amount of loose stool in the ostomy. She sat in the chair for 4 hours and has been ambulating in the halls. Voiding without difficulty. All questions answered. No concerns voiced at this time. .   Objective: Vital signs in last 24 hours: Temp:  [98.7 F (37.1 C)-99.3 F (37.4 C)] 99.3 F (37.4 C) (11/20 0541) Pulse Rate:  [92-108] 92 (11/20 0541) Resp:  [16-17] 17 (11/20 0541) BP: (117-127)/(55-67) 120/67 (11/20 0541) SpO2:  [94 %-95 %] 94 % (11/20 0541) Last BM Date : 12/12/22  Intake/Output from previous day: 11/19 0701 - 11/20 0700 In: 1050 [P.O.:1050] Out: 1466 [Urine:1450; Drains:1; Stool:15]  Physical Examination (performed by Dr. Pricilla Holm): General: alert, cooperative, and no distress Resp: clear to auscultation bilaterally Cardio: regular in rhythm and rate GI: incision: midline abdominal incision with staples with op site dressing in place with no drainage noted underneath, laparoscopic incision with dermabond to the left upper abdomen intact and right abdominal JP drain charged with minimal amount of serous drainage, ostomy to the left abdomen with applicance intact, stoma pink, flatus and small amount of loose brown stool in the bag. Active abdominal bowel sounds, abdomen is soft. Extremities: extremities normal, atraumatic, no cyanosis or edema  Labs: WBC/Hgb/Hct/Plts:   5.8/7.7/24.4/246 (11/20 0106) BUN/Cr/glu/ALT/AST/amyl/lip:  10/0.45/--/--/--/--/-- (11/20 0106)  Assessment: 77 y.o. s/p Procedure(s): DIAGNOSTIC LAPAROSCOPY, OMENTECTOMY, RADICAL TUMOR DEBULKING, PERITONEAL STRIPPING, MOBILIZATION SPLENIC FLEXURE, RECTOSIGMOID RESECTION,SMALL BOWEL RESECTION AND REANASTOMOSIS ,END TO END DESCENDING COLOSTOMY. OPEN BILATERAL SALPINGECTOMY-OOPHORECTOMY: stable Pain:  Pain is well-controlled on PRN medications.  Heme: Hgb 7.7 and Hct 24.4 this am-overall stable.  ID: WBC 5.8 this am. Given decadron and intra-op IV abxs. No evidence of infection at this time, afebrile.  CV: BP and HR stable. Tachycardia improved.   GI:  Tolerating po: yes. Antiemetics ordered PRN. S/P rectosigmoid resection, small bowel resection with reanastomosis, end to end descending colostomy. IV protonix ordered.  GU: Creatinine is 0.45 this am. Adequate output reported.   FEN: No critical values on am labs. Na+ 135, K+ 3.5.  Prophylaxis: SCDs, lovenox   Plan: HH orders placed for ostomy teaching/care Continue with soft diet. +flatus, minimal stool in ostomy at this time Encourage increasing mobility Continue plan of care. When increased stool in ostomy and meeting milestones, plan for discharge   LOS: 7 days    Doylene Bode 12/19/2022, 7:21 AM

## 2022-12-19 NOTE — Progress Notes (Signed)
Met with Rachel Carlson and her husband and son.  She said she is feeling nauseated this afternoon and just received nausea medication.  She has been up walking and in the chair for most of the day.  She has not had any output from her ostomy since it was changed this morning.  Her JP drain has a small amount of milky drainage and is charged.  Advised her to call if she has any questions or needs.

## 2022-12-19 NOTE — Consult Note (Addendum)
WOC Nurse ostomy follow up Stoma type/location:  colostomy LLQ  Stomal assessment/size: 1 3/8" round, above skin level, mildly edematous today, pink moist productive  Peristomal assessment:  intact, puncture site at 11 o'clock  Treatment options for stomal/peristomal skin: 2" barrier ring, no sting barrier wipe  Output  minimal brown liquid stool  Ostomy pouching: put in 1 piece flat flexible today Education provided: Patient was able to empty pouch using lock and roll mechanism, cleaned spout with toilet paper wick.  We reviewed emptying pouch when 1/3 to 1/2 full and changing entire pouching system 2 times a week and if leaking.   Patient removed old pouch using push pull technique.  She cleaned around stoma with water moistened washcloth.  We sized stoma today at 1 3/8", is protruding more at this visit so opted for a flat flexible one piece.  Due to neuropathy in fingers it appears a one piece may be more beneficial to her. Patient stretched 2" barrier ring and placed around stoma. She was able to cut new skin barrier at 1 3/8".  She held hand held mirror and we placed new pouch onto skin barrier ring.  Patient closed new pouch.   I answered patients questions.  I am unsure at this time if she will require convexity so did place in room a combination of flat and convex pouches.  She has (4) 1 piece flat pouches Lawson #[id number], (4) 1 piece convex Hart [place] #[id number].  Also (8) 2" skin barriers Hart [place] [phone] and stoma powder Lawson #6.  Did go over crusting technique with patient using stoma powder and no sting barrier wipes.    Reviewed again ordering of supplies with [employer], [facility] or through ostomy clinic.  Patient will have home health services at home at discharge.    WOC team will follow patient as long as she remains inpatient for education and support.   Thank you,    Priscella Mann MSN, RN-BC, CWOCN [phone]  Enrolled patient in [facility] Discharge program: Yes

## 2022-12-19 NOTE — Progress Notes (Signed)
Mobility Specialist - Progress Note   12/19/22 1045  Mobility  Activity Ambulated with assistance in hallway  Level of Assistance Standby assist, set-up cues, supervision of patient - no hands on  Assistive Device Front wheel walker  Distance Ambulated (ft) 1000 ft  Activity Response Tolerated well  Mobility Referral Yes  $Mobility charge 1 Mobility  Mobility Specialist Start Time (ACUTE ONLY) 1006  Mobility Specialist Stop Time (ACUTE ONLY) 1030  Mobility Specialist Time Calculation (min) (ACUTE ONLY) 24 min   Pt received in recliner and agreeable to mobility. No complaints during session. Pt to bed after session with all needs met. IV team in room.     [facility]

## 2022-12-19 NOTE — Plan of Care (Signed)

## 2022-12-19 NOTE — Plan of Care (Signed)
  Problem: Nutrition: Goal: Adequate nutrition will be maintained Outcome: Adequate for Discharge   

## 2022-12-20 LAB — CBC
HCT: 22.7 % — ABNORMAL LOW (ref 36.0–46.0)
Hemoglobin: 7.5 g/dL — ABNORMAL LOW (ref 12.0–15.0)
MCH: 31.6 pg (ref 26.0–34.0)
MCHC: 33 g/dL (ref 30.0–36.0)
MCV: 95.8 fL (ref 80.0–100.0)
Platelets: 247 10*3/uL (ref 150–400)
RBC: 2.37 MIL/uL — ABNORMAL LOW (ref 3.87–5.11)
RDW: 18.4 % — ABNORMAL HIGH (ref 11.5–15.5)
WBC: 5.5 10*3/uL (ref 4.0–10.5)
nRBC: 0 % (ref 0.0–0.2)

## 2022-12-20 LAB — BASIC METABOLIC PANEL
Anion gap: 8 (ref 5–15)
BUN: 8 mg/dL (ref 8–23)
CO2: 25 mmol/L (ref 22–32)
Calcium: 8.4 mg/dL — ABNORMAL LOW (ref 8.9–10.3)
Chloride: 101 mmol/L (ref 98–111)
Creatinine, Ser: 0.38 mg/dL — ABNORMAL LOW (ref 0.44–1.00)
GFR, Estimated: >60 mL/min (ref >60)
Glucose, Bld: 131 mg/dL — ABNORMAL HIGH (ref 70–99)
Potassium: 3.6 mmol/L (ref 3.5–5.1)
Sodium: 134 mmol/L — ABNORMAL LOW (ref 135–145)

## 2022-12-20 MED ORDER — APIXABAN 5 MG PO TABS
5.0000 mg | ORAL_TABLET | Freq: Two times a day (BID) | ORAL | Status: DC
  Administered 2022-12-20 – 2022-12-21 (×3): 5 mg via ORAL
  Filled 2022-12-20 (×3): qty 1

## 2022-12-20 NOTE — Plan of Care (Signed)
  Problem: Activity: Goal: Risk for activity intolerance will decrease Outcome: Progressing   Problem: Nutrition: Goal: Adequate nutrition will be maintained Outcome: Progressing   

## 2022-12-20 NOTE — Progress Notes (Addendum)
GYN Oncology Progress Note  8 Days Post-Op Procedure(s) (LRB): DIAGNOSTIC LAPAROSCOPY (Bilateral) OMENTECTOMY (N/A) RADICAL TUMOR DEBULKING, PERITONEAL STRIPPING, MOBILIZATION SPLENIC FLEXURE, RECTOSIGMOID RESECTION,SMALL BOWEL RESECTION AND REANASTOMOSIS ,END TO END DESCENDING COLOSTOMY (N/A) OPEN BILATERAL SALPINGECTOMY-OOPHORECTOMY (Bilateral)  Subjective: Patient reports sleeping well last pm. Abdominal tenderness improving. Had some stool in her ostomy bag from yesterday. No flatus or stool in bag this am. Took zofran yesterday. Started out nauseated, able to eat off and on. Ambulating in the halls.   Objective: Vital signs in last 24 hours: Temp:  [98.7 F (37.1 C)-99.7 F (37.6 C)] 98.7 F (37.1 C) (11/21 0532) Pulse Rate:  [94-100] 94 (11/21 0532) Resp:  [16-18] 18 (11/21 0532) BP: (107-124)/(53-61) 107/61 (11/21 0532) SpO2:  [94 %-96 %] 95 % (11/21 0532) Last BM Date : 12/12/22  Intake/Output from previous day: 11/20 0701 - 11/21 0700 In: 1080 [P.O.:1080] Out: 1310 [Urine:950; Drains:160; Stool:200]  Physical Examination (performed by Dr. Pricilla Holm): General: alert, cooperative, and no distress Resp: clear to auscultation bilaterally Cardio: regular in rhythm and rate GI: incision: midline abdominal incision with staples with op site dressing in place with no drainage noted underneath, laparoscopic incision with dermabond to the left upper abdomen intact and right abdominal JP drain charged with minimal amount of serous drainage, ostomy to the left abdomen with applicance intact, stoma pink, no flatus and bag empty. Active abdominal bowel sounds, abdomen is soft. Extremities: extremities normal, atraumatic, no cyanosis or edema R chest port accessed  Labs: WBC/Hgb/Hct/Plts:  5.5/7.5/22.7/247 (11/21 1610) BUN/Cr/glu/ALT/AST/amyl/lip:  8/0.38/--/--/--/--/-- (11/21 9604)  Assessment: 76 y.o. s/p Procedure(s): DIAGNOSTIC LAPAROSCOPY, OMENTECTOMY, RADICAL TUMOR DEBULKING,  PERITONEAL STRIPPING, MOBILIZATION SPLENIC FLEXURE, RECTOSIGMOID RESECTION,SMALL BOWEL RESECTION AND REANASTOMOSIS ,END TO END DESCENDING COLOSTOMY. OPEN BILATERAL SALPINGECTOMY-OOPHORECTOMY: stable Pain:  Pain is well-controlled on PRN medications.  Heme: Hgb 7.5 and Hct 22.7 this am-overall stable.  ID: WBC 5.5 this am. Given decadron and intra-op IV abxs. No evidence of infection at this time, afebrile.  CV: BP and HR stable. Tachycardia improved.   GI:  Tolerating po: yes. Antiemetics ordered PRN. S/P rectosigmoid resection, small bowel resection with reanastomosis, end to end descending colostomy. IV protonix ordered.  GU: Creatinine is 0.38 this am. Adequate output reported.   FEN: No critical values on am labs. Na+ 134, K+ 3.6.  Prophylaxis: SCDs, lovenox   Plan: Diet to regular.  +flatus, continuing to monitor for stool in ostomy Encourage increasing mobility Continue plan of care. When increased stool in ostomy and meeting milestones, plan for discharge, possibly tomorrow   LOS: 8 days    Rachel Carlson 12/20/2022, 6:47 AM

## 2022-12-20 NOTE — Progress Notes (Signed)
Mobility Specialist - Progress Note   12/20/22 1007  Mobility  Activity Ambulated with assistance in hallway  Level of Assistance Modified independent, requires aide device or extra time  Assistive Device Front wheel walker  Distance Ambulated (ft) 1000 ft  Activity Response Tolerated well  Mobility Referral Yes  $Mobility charge 1 Mobility  Mobility Specialist Start Time (ACUTE ONLY) 0902  Mobility Specialist Stop Time (ACUTE ONLY) [id number]  Mobility Specialist Time Calculation (min) (ACUTE ONLY) 16 min   Pt received in recliner and agreeable to mobility. No complaints during session. Pt to bathroom after session with all needs met.    [facility]

## 2022-12-20 NOTE — Plan of Care (Signed)
  Problem: Clinical Measurements: Goal: Diagnostic test results will improve Outcome: Progressing   Problem: Pain Management: Goal: General experience of comfort will improve Outcome: Progressing   Problem: Skin Integrity: Goal: Risk for impaired skin integrity will decrease Outcome: Progressing

## 2022-12-20 NOTE — Progress Notes (Signed)
GYN Oncology Progress Note  Patient reports nausea improved compared to yesterday, mild today. Having brown output from ostomy. Continuing to eat. Continue plan of care. Plan for possible discharge tomorrow if continuing to do well.

## 2022-12-21 ENCOUNTER — Telehealth: Payer: Self-pay | Admitting: Gastroenterology

## 2022-12-21 ENCOUNTER — Telehealth: Payer: Self-pay | Admitting: *Deleted

## 2022-12-21 LAB — CBC
HCT: 23.2 % — ABNORMAL LOW (ref 36.0–46.0)
Hemoglobin: 7.7 g/dL — ABNORMAL LOW (ref 12.0–15.0)
MCH: 31.8 pg (ref 26.0–34.0)
MCHC: 33.2 g/dL (ref 30.0–36.0)
MCV: 95.9 fL (ref 80.0–100.0)
Platelets: 283 10*3/uL (ref 150–400)
RBC: 2.42 MIL/uL — ABNORMAL LOW (ref 3.87–5.11)
RDW: 18.5 % — ABNORMAL HIGH (ref 11.5–15.5)
WBC: 5.2 10*3/uL (ref 4.0–10.5)
nRBC: 0 % (ref 0.0–0.2)

## 2022-12-21 MED ORDER — HEPARIN SOD (PORK) LOCK FLUSH 100 UNIT/ML IV SOLN
500.0000 [IU] | INTRAVENOUS | Status: AC | PRN
  Administered 2022-12-21: 500 [IU]

## 2022-12-21 NOTE — Telephone Encounter (Signed)
[facility] requesting a call back to discuss if patient needs to stay on Lialda. Please advise.

## 2022-12-21 NOTE — Consult Note (Signed)
WOC Nurse ostomy follow up Stoma type/location: LLQ colostomy  Stomal assessment/size: 1 3/8" round, above skin level, edematous, pink moist productive of brown soft stool  Peristomal assessment:  intact, puncture site noted as previous  Treatment options for stomal/peristomal skin: 2" barrier ring  Output  approximately 20 mls soft brown stool  Ostomy pouching: 2 piece 2 1/4" skin barrier Hart [place] [phone]), 2 1/4" pouch Hart [place] [phone]) and 2" barrier ring Hart [place] [phone])  Education provided:  Reviewed emptying when 1/3 to 1/2 full. Patient was able to demonstrate emptying pouch today using lock and roll mechanism and cleaning spout with toilet paper wick. Patient removed old skin barrier/;pouch.  Cleaned around stoma with water moistened washcloth.  We sized stoma at 1 3/8" round.  Patient cut her own skin barrier to 1 3/8".  Patient stretched and placed 2" barrier ring around stoma and then placed new skin barrier onto barrier ring.  Patient closed new pouch.    Answered patients questions. All supplies in room for discharge home.   Enrolled patient in [facility] [employer] Discharge program: Yes  WOC team will continue to follow if patient remains inpatient.  Plans to discharge this afternoon or in morning.  Patient is going home with home health follow-up as well.   Thank you,    Priscella Mann MSN, RN-BC, [employer] [phone]

## 2022-12-21 NOTE — Discharge Summary (Signed)
Physician Discharge Summary  Patient ID: Rachel Carlson MRN: [id number] DOB/AGE: 1946-01-29 77 y.o.  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    Hospital Course: On 12/12/2022, the patient underwent the following: Procedure(s): DIAGNOSTIC LAPAROSCOPY, OMENTECTOMY, RADICAL TUMOR DEBULKING, PERITONEAL STRIPPING, MOBILIZATION SPLENIC FLEXURE, RECTOSIGMOID RESECTION,SMALL BOWEL RESECTION AND, REANASTOMOSIS ,END TO END DESCENDING COLOSTOMY, OPEN BILATERAL SALPINGECTOMY-OOPHORECTOMY. The postoperative course was overall uneventful.  She was discharged to home on postoperative day 9 tolerating a regular diet, ostomy with output and flatus/stoma pink, ambulating, voiding, pain controlled with oral medications. She is discharged home with home health for ostomy teaching/care. She has also been advised to resume her Eliquis. JP drain ordered for removal at discharge. She has follow up arranged for Monday, November 25 for staple removal.  Consults: None  Significant Diagnostic Studies: Labs  Treatments: IV hydration, surgery  Discharge Exam (from am assessment): Blood pressure 114/60, pulse 98, temperature 98.2 F (36.8 C), temperature source Oral, resp. rate 18, height 5\' 2"  (1.575 m), weight 171 lb (77.6 kg), last menstrual period 05/29/1996, SpO2 95%. General: alert, cooperative, and no distress Resp: clear to auscultation bilaterally Cardio: regular in rhythm and rate GI: incision: midline abdominal incision with staples with op site dressing in place with no drainage noted underneath, laparoscopic incision with dermabond to the left upper abdomen intact and right abdominal JP drain charged with minimal amount of serous drainage (to be removed at discharge), ostomy to the  left abdomen with appliance intact, stoma pink, with brown stool and flatus. Active abdominal bowel sounds, abdomen is soft. Extremities: extremities normal, atraumatic, no cyanosis or edema  Disposition: Discharge disposition: 01-Home or Self Care       Discharge Instructions     Call MD for:  difficulty breathing, headache or visual disturbances   Complete by: As directed    Call MD for:  extreme fatigue   Complete by: As directed    Call MD for:  hives   Complete by: As directed    Call MD for:  persistant dizziness or light-headedness   Complete by: As directed    Call MD for:  persistant nausea and vomiting   Complete by: As directed    Call MD for:  redness, tenderness, or signs of infection (pain, swelling, redness, odor or green/yellow discharge around incision site)   Complete by: As directed    Call MD for:  severe uncontrolled pain   Complete by: As directed    Call MD for:  temperature >100.4   Complete by: As directed    Diet - low sodium heart healthy   Complete by: As directed    Driving Restrictions   Complete by: As directed    No driving for around 2-95 days when the following criteria have been met: Do not take narcotics and drive. You need to make sure your reaction time has returned.   Increase activity slowly   Complete by: As directed    Lifting restrictions   Complete by: As directed    No lifting greater than 10 lbs, pushing, pulling, straining for 6 weeks.   Sexual Activity Restrictions   Complete by: As directed    No sexual activity, nothing in the vagina, for 6 weeks.      Allergies as of 12/21/2022  Reactions   Aspartame    Intolerance to artifical sweeteners, pt states caused burning of bladder   Pantoprazole Sodium Diarrhea   Abdominal pain   Ranitidine Diarrhea   abdominal pain   Omeprazole Diarrhea   Abdominal pain   Sulfasalazine Rash   Fever, chills, headache, muscle pain        Medication List     STOP taking  these medications    bisacodyl 5 MG EC tablet Commonly known as: DULCOLAX   doxycycline 100 MG capsule Commonly known as: VIBRAMYCIN   Lialda 1.2 g EC tablet Generic drug: mesalamine   metroNIDAZOLE 500 MG tablet Commonly known as: FLAGYL   polyethylene glycol powder 17 GM/SCOOP powder Commonly known as: MiraLax       TAKE these medications    apixaban 5 MG Tabs tablet Commonly known as: ELIQUIS Take 1 tablet (5 mg total) by mouth 2 (two) times daily.   calcium citrate-vitamin D 315-200 MG-UNIT tablet Commonly known as: CITRACAL+D Take 1 tablet by mouth 2 (two) times daily.   CARBOPLATIN IV Inject into the vein every 21 ( twenty-one) days. port   Co Q-10 200 MG Caps Take 200 mg by mouth daily.   CRANBERRY PO Take 650 mg by mouth daily.   cyanocobalamin 1000 MCG tablet Commonly known as: VITAMIN B12 Take 1,000 mcg by mouth daily.   FISH OIL TRIPLE STRENGTH PO Take 1 capsule by mouth daily.   GLUCOSAMINE CHONDR COMPLEX PO Take 2 capsules by mouth daily.   GRAPE SEED EXTRACT PO Take 1 capsule by mouth daily.   ketoconazole 2 % cream Commonly known as: NIZORAL Apply 1 Application topically daily. To yeast rash What changed:  when to take this reasons to take this additional instructions   lansoprazole 15 MG capsule Commonly known as: PREVACID Take 1 capsule (15 mg total) by mouth 2 (two) times daily before a meal. What changed: when to take this   lidocaine-prilocaine cream Commonly known as: EMLA Apply to affected area once What changed:  how much to take how to take this when to take this reasons to take this additional instructions   milk thistle 175 MG tablet Take 175 mg by mouth daily.   ondansetron 8 MG tablet Commonly known as: Zofran Take 1 tablet (8 mg total) by mouth every 8 (eight) hours as needed for nausea or vomiting. Start on the third day after carboplatin.   PACLITAXEL IV Inject into the vein every 21 ( twenty-one) days.  port   phenazopyridine 200 MG tablet Commonly known as: Pyridium Take 1 tablet (200 mg total) by mouth 3 (three) times daily as needed for pain.   rosuvastatin 20 MG tablet Commonly known as: CRESTOR Take 1 tablet (20 mg total) by mouth at bedtime.   SYSTANE OP Place 1 drop into both eyes daily.   traMADol 50 MG tablet Commonly known as: ULTRAM Take 1 tablet (50 mg total) by mouth every 6 (six) hours as needed for severe pain (pain score 7-10). For AFTER surgery only, do not take and drive   traZODone 50 MG tablet Commonly known as: DESYREL Take 1 tablet (50 mg total) by mouth at bedtime as needed for sleep.   vitamin C 1000 MG tablet Take 1,000 mg by mouth daily.   Vitamin D-3 125 MCG (5000 UT) Tabs Take 5,000 Units by mouth daily.   zinc gluconate 50 MG tablet Take 50 mg by mouth daily.        Follow-up Information  [facility] Follow up.   Why: [facility] will provide a few nursing visits after discharge for the new ostomy.        [facility] [place] Follow up on 12/24/2022.   Specialty: Gynecologic Oncology Why: with nurse at the [facility] in GYN ONC for staple removal Contact information: [address] [place] [address] [phone]        Carver Fila, MD Follow up on 01/18/2023.   Specialty: Gynecologic Oncology Why: for post-op check at the [facility] information: [address] [place] [address] [phone]                 Greater than thirty minutes were spend for face to face discharge instructions and discharge orders/summary in EPIC.   Signed: Doylene Bode 12/21/2022, 2:07 PM

## 2022-12-21 NOTE — Telephone Encounter (Signed)
===  View-only below this line=== ----- Message ----- From: Shellia Cleverly, DO Sent: 12/21/2022   3:56 PM EST To: Richardson Chiquito, RN  No, does not need to stay on Lialda.

## 2022-12-21 NOTE — Telephone Encounter (Signed)
Spoke with Dottie from patient's Gastroenterologist office Dr. Leone Payor and she relayed message from Dr. Barron Alvine after reviewing patient's recent surgery,  the patient Rachel Carlson no longer needs to take her Lialda. Advised we would let the patient know and message relayed to providers.

## 2022-12-21 NOTE — Telephone Encounter (Signed)
-----   Message from Doylene Bode sent at 12/21/2022  2:08 PM EST ----- Please reach out to her GI provider and find out about the following:  She was previously taking Lialda for ulcerative colitis. Her recent surgery included rectosigmoid removal with end descending colostomy:  Diagnostic laparoscopy, conversion to exploratory laparotomy with radical tumor debulking including total omentectomy, resection of peritoneal disease, small bowel resection with 3 anastomosis, en bloc resection of the left fallopian tube and ovary as well as rectosigmoid colon, left ureterolysis, right salpingo-oophorectomy, end descending colostomy, cystoscopy.  Does she still need to take the Lialda? Dr. Pricilla Holm said she felt she did not but we wanted their input.  Thanks and please let the pt know once you find out.

## 2022-12-21 NOTE — Progress Notes (Signed)
GYN Oncology Progress Note  9 Days Post-Op Procedure(s) (LRB): DIAGNOSTIC LAPAROSCOPY (Bilateral) OMENTECTOMY (N/A) RADICAL TUMOR DEBULKING, PERITONEAL STRIPPING, MOBILIZATION SPLENIC FLEXURE, RECTOSIGMOID RESECTION,SMALL BOWEL RESECTION AND REANASTOMOSIS ,END TO END DESCENDING COLOSTOMY (N/A) OPEN BILATERAL SALPINGECTOMY-OOPHORECTOMY (Bilateral)  Subjective: Patient reports having night sweats and waking up soaking wet. Eating small amounts, working on controlling nausea. Ambulated in the halls frequently and sat in the chair. No concerns voiced.   Objective: Vital signs in last 24 hours: Temp:  [98.2 F (36.8 C)-98.3 F (36.8 C)] 98.2 F (36.8 C) (11/22 0511) Pulse Rate:  [90-100] 90 (11/22 0511) Resp:  [16-18] 18 (11/22 0511) BP: (117-129)/(61-65) 123/61 (11/22 0511) SpO2:  [93 %-97 %] 93 % (11/22 0511) Last BM Date : 12/20/22  Intake/Output from previous day: 11/21 0701 - 11/22 0700 In: 960 [P.O.:960] Out: 2080 [Urine:1900; Drains:70; Stool:110]  Physical Examination (performed by Dr. Pricilla Holm): General: alert, cooperative, and no distress Resp: clear to auscultation bilaterally Cardio: regular in rhythm and rate GI: incision: midline abdominal incision with staples with op site dressing in place with no drainage noted underneath, laparoscopic incision with dermabond to the left upper abdomen intact and right abdominal JP drain charged with minimal amount of serous drainage, ostomy to the left abdomen with applicance intact, stoma pink, with brown stool and flatus. Active abdominal bowel sounds, abdomen is soft. Extremities: extremities normal, atraumatic, no cyanosis or edema R chest port accessed  Labs: CBC pending  Assessment: 77 y.o. s/p Procedure(s): DIAGNOSTIC LAPAROSCOPY, OMENTECTOMY, RADICAL TUMOR DEBULKING, PERITONEAL STRIPPING, MOBILIZATION SPLENIC FLEXURE, RECTOSIGMOID RESECTION,SMALL BOWEL RESECTION AND REANASTOMOSIS ,END TO END DESCENDING COLOSTOMY. OPEN  BILATERAL SALPINGECTOMY-OOPHORECTOMY: stable Pain:  Pain is well-controlled on PRN medications.  Heme: Hgb 7.5 and Hct 22.7 12/20/2022 am-overall stable.  ID: WBC 5.5 12/20/22 am. Given decadron and intra-op IV abxs. No evidence of infection at this time, afebrile. Given symptoms, awaiting WBC count.  CV: BP and HR stable. Tachycardia improved.   GI:  Tolerating po: yes. Antiemetics ordered PRN. S/P rectosigmoid resection, small bowel resection with reanastomosis, end to end descending colostomy. IV protonix ordered.  GU: Creatinine is 0.38 12/20/22 am. Adequate output reported.   FEN: No critical values on am lab from 12/20/22. Na+ 134, K+ 3.6.  Prophylaxis: SCDs, lovenox   Plan: Awaiting am CBC results Needs staple removal appt for Monday in our office. This will be arranged.  Continue current plan of care Encourage increasing mobility Plan for discharge later today or in the am    LOS: 9 days    Veronica Guerrant D Javione Gunawan 12/21/2022, 7:04 AM

## 2022-12-21 NOTE — Telephone Encounter (Signed)
GYN Onc has been advised that patient no longer needs to take Lialda.

## 2022-12-21 NOTE — Discharge Instructions (Addendum)
AFTER SURGERY INSTRUCTIONS   Return to work: 4-6 weeks if applicable  We have made an appointment for the office for Monday, Nov 25 for staple removal.   You can continue on your Eliquis 5 mg twice daily dosing. Call the office for any abnormal bleeding.  We reached out to your GI provider about lialda and you do not have to take this anymore given your recent bowel surgery.   Activity: 1. Be up and out of the bed during the day.  Take a nap if needed.  You may walk up steps but be careful and use the hand rail.  Stair climbing will tire you more than you think, you may need to stop part way and rest.    2. No lifting or straining for 6 weeks over 10 pounds. No pushing, pulling, straining for 6 weeks.   3. No driving for around 2 week(s) if you have open surgery or once the following criteria have been met: Do not drive if you are taking narcotic pain medicine and make sure that your reaction time has returned.    4. You can shower as soon as the next day after surgery. Shower daily.  Use your regular soap and water (not directly on the incision) and pat your incision(s) dry afterwards; don't rub.  No tub baths or submerging your body in water until cleared by your surgeon. If you have the soap that was given to you by pre-surgical testing that was used before surgery, you do not need to use it afterwards because this can irritate your incisions.    5. No sexual activity and nothing in the vagina for 6 weeks.   6. You may experience a small amount of clear drainage from your incisions, which is normal.  If the drainage persists, increases, or changes color please call the office.   7. Do not use creams, lotions, or ointments such as neosporin on your incisions after surgery until advised by your surgeon because they can cause removal of the dermabond glue on your incisions.     9. Take Tylenol first for pain if you are able to take these medication and only use narcotic pain medication for  severe pain not relieved by the Tylenol.  Monitor your Tylenol intake to a max of 4,000 mg in a 24 hour period.    Diet: 1. Low sodium Heart Healthy Diet is recommended but you are cleared to resume your normal (before surgery) diet after your procedure.   2. Monitor your ostomy output. If you need to take a stool softener, you can begin taking this as needed.     Wound Care: 1. Keep clean and dry.  Shower daily.   Reasons to call the Doctor: Fever - Oral temperature greater than 100.4 degrees Fahrenheit Foul-smelling vaginal discharge Difficulty urinating Nausea and vomiting Increased pain at the site of the incision that is unrelieved with pain medicine. Difficulty breathing with or without chest pain New calf pain especially if only on one side Sudden, continuing increased vaginal bleeding with or without clots.   Contacts: For questions or concerns you should contact:   Dr. Eugene Garnet at [phone]   Warner Mccreedy, NP at [phone]   After Hours: call [phone] and have the GYN Oncologist paged/contacted (after 5 pm or on the weekends). You will speak with an after hours RN and let he or she know you have had surgery.   Messages sent via mychart are for non-urgent matters and are not  responded to after hours so for urgent needs, please call the after hours number.

## 2022-12-21 NOTE — Telephone Encounter (Signed)
Dr C-  Patient appears to have had extensive surgery 12/12/22 with: DIAGNOSTIC LAPAROSCOPY, OMENTECTOMY, RADICAL TUMOR DEBULKING, PERITONEAL STRIPPING, MOBILIZATION SPLENIC FLEXURE, RECTOSIGMOID RESECTION,SMALL BOWEL RESECTION AND, REANASTOMOSIS ,END TO END DESCENDING COLOSTOMY, OPEN BILATERAL SALPINGECTOMY-OOPHORECTOMY.  Does she need to continue Lialda?

## 2022-12-21 NOTE — TOC Transition Note (Signed)
Transition of Care [facility]) - CM/SW Discharge Note   Patient Details  Name: Rachel Carlson MRN: [id number] Date of Birth: 06-28-1945  Transition of Care [facility]) CM/SW Contact:  Erin Sons, LCSW Phone Number: 12/21/2022, 2:24 PM   Clinical Narrative:     CSW notified [facility] that pt is discharging today. HH orders have already been entered.     Barriers to Discharge: No Barriers Identified   Patient Goals and CMS Choice CMS Medicare.gov Compare Post Acute Care list provided to:: Patient Choice offered to / list presented to : Patient   Discharge Plan and Services Additional resources added to the After Visit Summary for   In-house Referral: Clinical Social Work   Post Acute Care Choice: Home Health          DME Arranged: N/A DME Agency: NA       HH Arranged: RN HH Agency: [facility] ([facility] Date HH Agency Contacted: 12/17/22 Time HH Agency Contacted: 1012 Representative spoke with at [facility] Agency: Morrie Sheldon  Social Determinants of Health (SDOH) Interventions SDOH Screenings   Food Insecurity: No Food Insecurity (12/12/2022)  Housing: Low Risk  (12/12/2022)  Transportation Needs: No Transportation Needs (12/12/2022)  Utilities: Not At Risk (12/12/2022)  Alcohol Screen: Low Risk  (08/28/2022)  Depression (PHQ2-9): Low Risk  (08/28/2022)  Financial Resource Strain: Low Risk  (08/28/2022)  Physical Activity: Inactive (08/28/2022)  Social Connections: Moderately Integrated (08/28/2022)  Stress: No Stress Concern Present (08/28/2022)  Tobacco Use: Low Risk  (12/12/2022)  Health Literacy: Adequate Health Literacy (08/28/2022)     Readmission Risk Interventions     No data to display

## 2022-12-23 DIAGNOSIS — Z9049 Acquired absence of other specified parts of digestive tract: Secondary | ICD-10-CM | POA: Diagnosis not present

## 2022-12-23 DIAGNOSIS — Z7901 Long term (current) use of anticoagulants: Secondary | ICD-10-CM | POA: Diagnosis not present

## 2022-12-23 DIAGNOSIS — Z433 Encounter for attention to colostomy: Secondary | ICD-10-CM | POA: Diagnosis not present

## 2022-12-23 DIAGNOSIS — Z483 Aftercare following surgery for neoplasm: Secondary | ICD-10-CM | POA: Diagnosis not present

## 2022-12-23 DIAGNOSIS — Z452 Encounter for adjustment and management of vascular access device: Secondary | ICD-10-CM | POA: Diagnosis not present

## 2022-12-23 DIAGNOSIS — K682 Retroperitoneal fibrosis: Secondary | ICD-10-CM | POA: Diagnosis not present

## 2022-12-24 ENCOUNTER — Other Ambulatory Visit: Payer: Self-pay | Admitting: Oncology

## 2022-12-24 ENCOUNTER — Encounter: Payer: Self-pay | Admitting: Oncology

## 2022-12-24 ENCOUNTER — Encounter: Payer: Self-pay | Admitting: Hematology

## 2022-12-24 ENCOUNTER — Inpatient Hospital Stay (HOSPITAL_BASED_OUTPATIENT_CLINIC_OR_DEPARTMENT_OTHER): Payer: Medicare Other | Admitting: Gynecologic Oncology

## 2022-12-24 ENCOUNTER — Telehealth: Payer: Self-pay

## 2022-12-24 VITALS — BP 134/67 | HR 85 | Temp 98.5°F | Resp 20 | Wt 159.2 lb

## 2022-12-24 DIAGNOSIS — C482 Malignant neoplasm of peritoneum, unspecified: Secondary | ICD-10-CM

## 2022-12-24 DIAGNOSIS — C579 Malignant neoplasm of female genital organ, unspecified: Secondary | ICD-10-CM | POA: Insufficient documentation

## 2022-12-24 DIAGNOSIS — Z9889 Other specified postprocedural states: Secondary | ICD-10-CM

## 2022-12-24 DIAGNOSIS — L539 Erythematous condition, unspecified: Secondary | ICD-10-CM | POA: Diagnosis not present

## 2022-12-24 LAB — BASIC METABOLIC PANEL
Anion gap: 10 (ref 5–15)
BUN: 13 mg/dL (ref 8–23)
CO2: 24 mmol/L (ref 22–32)
Calcium: 9.4 mg/dL (ref 8.9–10.3)
Chloride: 104 mmol/L (ref 98–111)
Creatinine, Ser: 0.62 mg/dL (ref 0.44–1.00)
GFR, Estimated: >60 mL/min (ref >60)
Glucose, Bld: 109 mg/dL — ABNORMAL HIGH (ref 70–99)
Potassium: 3.8 mmol/L (ref 3.5–5.1)
Sodium: 138 mmol/L (ref 135–145)

## 2022-12-24 LAB — CBC WITH DIFFERENTIAL/PLATELET
Abs Immature Granulocytes: 0.02 10*3/uL (ref 0.00–0.07)
Basophils Absolute: 0 10*3/uL (ref 0.0–0.1)
Basophils Relative: 1 %
Eosinophils Absolute: 0.2 10*3/uL (ref 0.0–0.5)
Eosinophils Relative: 3 %
HCT: 26.5 % — ABNORMAL LOW (ref 36.0–46.0)
Hemoglobin: 8.6 g/dL — ABNORMAL LOW (ref 12.0–15.0)
Immature Granulocytes: 0 %
Lymphocytes Relative: 19 %
Lymphs Abs: 1.2 10*3/uL (ref 0.7–4.0)
MCH: 31.6 pg (ref 26.0–34.0)
MCHC: 32.5 g/dL (ref 30.0–36.0)
MCV: 97.4 fL (ref 80.0–100.0)
Monocytes Absolute: 0.5 10*3/uL (ref 0.1–1.0)
Monocytes Relative: 7 %
Neutro Abs: 4.4 10*3/uL (ref 1.7–7.7)
Neutrophils Relative %: 70 %
Platelets: 404 10*3/uL — ABNORMAL HIGH (ref 150–400)
RBC: 2.72 MIL/uL — ABNORMAL LOW (ref 3.87–5.11)
RDW: 18.2 % — ABNORMAL HIGH (ref 11.5–15.5)
WBC: 6.3 10*3/uL (ref 4.0–10.5)
nRBC: 0 % (ref 0.0–0.2)

## 2022-12-24 MED ORDER — CEPHALEXIN 500 MG PO CAPS: 500.0000 mg | ORAL_CAPSULE | Freq: Four times a day (QID) | ORAL | 0 refills | Status: DC

## 2022-12-24 NOTE — Transitions of Care (Post Inpatient/ED Visit) (Unsigned)
   12/24/2022  Name: Rachel Carlson MRN: [id number] DOB: 11-Oct-1945  Today's TOC FU Call Status: Today's TOC FU Call Status:: Unsuccessful Call (1st Attempt) Unsuccessful Call (1st Attempt) Date: 12/24/22  Attempted to reach the patient regarding the most recent Inpatient/ED visit.  Follow Up Plan: Additional outreach attempts will be made to reach the patient to complete the Transitions of Care (Post Inpatient/ED visit) call.   Abby Kolt Mcwhirter, CMA  [facility] AWV Team Direct Dial: [phone]

## 2022-12-24 NOTE — Progress Notes (Signed)
Sent order requisition for [facility] testing on accession [id number] per Dr. Pricilla Holm.

## 2022-12-24 NOTE — Patient Instructions (Signed)
We plan on obtaining a CBC and metabolic panel today and we will contact you with the results.   I have sent in keflex for incisional redness and firmness. Please call the office for worsening symptoms, increased redness past the marked lines, any new symptoms such as fever etc.  We will plan on seeing you in the office on Monday or sooner if needed.

## 2022-12-24 NOTE — Progress Notes (Unsigned)
Patient seen in clinic for surgical site staple removal. Pt also states she is eating and drinking ok, urinating without difficulty, passing gas and ostomy with stools. Pt denies fever and or chills. Rates her pain 5/10 and is taking tylenol and only has used tramadol twice.   Vertical abdominal incision clean and dry, slight redness to upper and lower portion. Areas of redness marked. Staples removed and steri strips applied. Pt tolerated procedure well.

## 2022-12-24 NOTE — Progress Notes (Signed)
Gynecologic Oncology Multi-Disciplinary Disposition Conference Note  Date of the Conference: 12/24/2022  Patient Name: Rachel Carlson  Referring Provider: Dr. Ellin Saba Primary GYN Oncologist: Dr. 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 Disposition is to adjuvant chemotherapy. MMR testing requested on surgical pathology. Also somatic genetic testing needed.   This Multidisciplinary conference took place involving physicians from Gynecologic Oncology, Medical Oncology, Radiation Oncology, Pathology, Radiology along with the Gynecologic Oncology Nurse Practitioner and Gynecologic Oncology Nurse Navigator.  Comprehensive assessment of the patient's malignancy, staging, need for surgery, chemotherapy, radiation therapy, and need for further testing were reviewed. Supportive measures, both inpatient and following discharge were also discussed. The recommended plan of care is documented. Greater than 35 minutes were spent correlating and coordinating this patient's care.

## 2022-12-25 ENCOUNTER — Encounter: Payer: Self-pay | Admitting: Oncology

## 2022-12-25 ENCOUNTER — Telehealth: Payer: Self-pay | Admitting: *Deleted

## 2022-12-25 ENCOUNTER — Telehealth: Payer: Self-pay | Admitting: Family Medicine

## 2022-12-25 DIAGNOSIS — C786 Secondary malignant neoplasm of retroperitoneum and peritoneum: Secondary | ICD-10-CM

## 2022-12-25 LAB — SURGICAL PATHOLOGY

## 2022-12-25 NOTE — Telephone Encounter (Signed)
Called patient home number was busy and cell number mail box is full. Will call back later time.

## 2022-12-25 NOTE — Telephone Encounter (Signed)
-----   Message from Doylene Bode sent at 12/25/2022  9:37 AM EST ----- Could someone please reach out and see how the redness is around the incision? Also please let her know her labs are stable. Her white count is at 6.3. Would make sure she is staying hydrated. Please have her call for any new symptoms, worsening of the redness etc. Please make sure she has the after hours number for the holiday and weekend coming up

## 2022-12-25 NOTE — Progress Notes (Signed)
Referral placed to genetic counseling at [facility].

## 2022-12-25 NOTE — Transitions of Care (Post Inpatient/ED Visit) (Signed)
   12/25/2022  Name: Rachel Carlson MRN: [id number] DOB: [birth year]  Today's TOC FU Call Status: Today's TOC FU Call Status:: Successful TOC FU Call Completed Unsuccessful Call (1st Attempt) Date: 12/24/22 Rachel Carlson Carlson FU Call Complete Date: 12/25/22 Patient's Name and Date of Birth confirmed.  Transition Care Management Follow-up Telephone Call Date of Discharge: 12/21/22 Discharge Facility: [facility]) Type of Discharge: Inpatient Admission Primary Inpatient Discharge Diagnosis:: Partial intestinal obstruction, unspecified as to cause How have you been since you were released from the [facility]?: Better Any questions or concerns?: No  Items Reviewed: Did you receive and understand the discharge instructions provided?: Yes Medications obtained,verified, and reconciled?: Yes (Medications Reviewed) Any new allergies since your discharge?: No Dietary orders reviewed?: Yes Type of Diet Ordered:: regular Do you have support at home?: Yes People in Home: spouse, child(ren), adult  Medications Reviewed Today: Medications Reviewed Today   Medications were not reviewed in this encounter     Home Care and Equipment/Supplies: Were Home Health Services Ordered?: Yes Name of Home Health Agency:: [facility] Has Agency set up a time to come to your home?: Yes First Home Health Visit Date: 12/23/22 Any new equipment or medical supplies ordered?: No (patient states that [facility] told her HH would order ostomy supplies for her but [facility] stated they do not order the ostomy supplies.)  Functional Questionnaire: Do you need assistance with bathing/showering or dressing?: No Do you need assistance with meal preparation?: No Do you need assistance with eating?: No Do you have difficulty maintaining continence: No Do you need assistance with getting out of bed/getting out of a chair/moving?: No Do you have difficulty managing or taking your medications?: No  Follow up appointments reviewed: PCP  Follow-up appointment confirmed?: NA Specialist Carlson Follow-up appointment confirmed?: Yes Date of Specialist follow-up appointment?: 12/31/22 Follow-Up Specialty Provider:: Dr. Pricilla Holm Do you need transportation to your follow-up appointment?: No Do you understand care options if your condition(s) worsen?: Yes-patient verbalized understanding    Rachel Carlson, CMA  Rachel Carlson AWV Team Direct Dial: [phone]

## 2022-12-25 NOTE — Telephone Encounter (Signed)
Pt called in and stated that she would like a call back regarding her meditation

## 2022-12-25 NOTE — Telephone Encounter (Signed)
Spoke with Ms. Canals this morning. Pt states there has been no change to the redness on her incision, "looks the same, not worse" Pt denies fever and or chills. Pt also denies all urinary symptoms. Relayed message from Warner Mccreedy, NP that her labs are stable. White count is 6.3. Pt encouraged to keep herself hydrated and Pt advised to call the office with any new symptoms and or worsening redness. Pt was given the after hours number to call with any concerns over Thanksgiving and the weekend. [phone]. Pt verbalized understanding and reminded of her appt. With Warner Mccreedy, NP on Monday 12/2 at 3:30 and we are working on moving her post op appt. Up from 12/20 with Dr. Pricilla Holm. Pt thanked the office for calling.

## 2022-12-25 NOTE — Telephone Encounter (Signed)
Order for this came in the s-drive for PCP, I have printed it and placed it in your inbox for review

## 2022-12-25 NOTE — Telephone Encounter (Signed)
Thanks for the heads up  Her GI or surgeon should be able to order ostomy supplies but if not please let me know (I would not know what to order)  Thanks  Hope she is doing ok

## 2022-12-26 ENCOUNTER — Inpatient Hospital Stay: Payer: Medicare Other

## 2022-12-26 ENCOUNTER — Encounter: Payer: Self-pay | Admitting: Hematology

## 2022-12-26 DIAGNOSIS — Z7901 Long term (current) use of anticoagulants: Secondary | ICD-10-CM | POA: Diagnosis not present

## 2022-12-26 DIAGNOSIS — L539 Erythematous condition, unspecified: Secondary | ICD-10-CM | POA: Insufficient documentation

## 2022-12-26 DIAGNOSIS — Z483 Aftercare following surgery for neoplasm: Secondary | ICD-10-CM | POA: Diagnosis not present

## 2022-12-26 DIAGNOSIS — Z9049 Acquired absence of other specified parts of digestive tract: Secondary | ICD-10-CM | POA: Diagnosis not present

## 2022-12-26 DIAGNOSIS — Z433 Encounter for attention to colostomy: Secondary | ICD-10-CM | POA: Diagnosis not present

## 2022-12-26 DIAGNOSIS — Z452 Encounter for adjustment and management of vascular access device: Secondary | ICD-10-CM | POA: Diagnosis not present

## 2022-12-26 DIAGNOSIS — K682 Retroperitoneal fibrosis: Secondary | ICD-10-CM | POA: Diagnosis not present

## 2022-12-26 MED ORDER — APIXABAN 5 MG PO TABS: 5.0000 mg | ORAL_TABLET | Freq: Two times a day (BID) | ORAL | 0 refills | Status: DC

## 2022-12-26 NOTE — Telephone Encounter (Signed)
Form faxed

## 2022-12-26 NOTE — Progress Notes (Signed)
GYN Oncology Post-operative Follow Up  Treatment History:    Oncology History  Carcinomatosis (HCC)  08/31/2022 Initial Diagnosis    Carcinomatosis (HCC)    09/12/2022 -  Chemotherapy    Patient is on Treatment Plan : OVARIAN Carboplatin (AUC 6) + Paclitaxel (175) q21d X 6 Cycles       The patient initially presented to the emergency department on 7/8 with a couple months of worsening abdominal distention.  She had previously had outpatient ultrasound which showed mild ascites, further workup was negative for cirrhosis (elastography).  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  Diverticulosis of the descending colon and sigmoid colon noted with moderate wall thickening of the distal sigmoid colon, possibly sequela of prior diverticulitis versus acute inflammation or underlying malignancy.  Small bilateral pleural effusions with adjacent subsegmental atelectasis noted.   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   She underwent colonoscopy on 09/07/2022 with apparent extraluminal compression in the distal sigmoid colon, approximately 20 cm from the anal verge.  Mucosa in his area was edematous and biopsied.  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   CA-125 on 7/28 was 7,356. CEA was normal (1). C4 carboplatin/taxol on 11/14/22. CA 125 at that time was 4,552.  CT scan on 11/07/2022 returned with: 1. Small to moderate volume ascites, decreased. 2. Diffuse omental soft tissue caking and thickening and enhancement of the right deep pelvic peritoneum, compatible with peritoneal carcinomatosis, not substantially changed from 08/23/2022 CT. 3. Small to moderate posterior left pleural effusion, increased. Trace posterior right  pleural effusion is stable. 4. No lymphadenopathy or additional findings of metastatic disease in the chest, abdomen or pelvis. 5. Chronic findings include: Three-vessel coronary atherosclerosis. Moderate hiatal hernia. Marked left colonic diverticulosis. Small cystocele. Aortic Atherosclerosis.  Pre-operatively, she did undergo a chest xray on 11/30/2022 along with a thoracentesis on 12/10/2022 with 1.4 L removed with cytology returning with adenocarcinoma.  On 12/12/2022, she underwent diagnostic laparoscopy, conversion to exploratory laparotomy with radical tumor debulking including total omentectomy, resection of peritoneal disease, small bowel resection with 3 anastomosis, en bloc resection of the left fallopian tube and ovary as well as rectosigmoid colon, left ureterolysis, right salpingo-oophorectomy, end descending colostomy, cystoscopy with Dr. Eugene Garnet. Her hospital admission was from 12/12/2022 to 12/21/2022.   Interval: She presents today to the office with her son for post-operative follow up and staple removal. She has been eating and drinking without emesis. Mild intermittent nausea controlled. Urinating without difficulty. Ostomy with output and flatus. Appliance last changed on Friday. Asking about foods to avoid to limit gas and odor control measures. Pt denies fever and or chills. Rates her pain 5/10 and is taking tylenol and only has used tramadol twice. No concerns voiced about incision. Patient's home health agency does not order ostomy supplies. The patient states she was given several different ostomy appliance bags in the hospital. She will plan on trying the different bags to see what works best for her. She does have the contact information for the ostomy clinic as well.     Exam: Alert, oriented, in no acute distress.  Lungs clear. Heart regular in rate and rhythm. Abdomen is soft, non-tender, non-distended. Ostomy appliance is intact with flatus and loose brown  stool in bag. Stoma is pink/red. Midline abdominal  incision with erythema, increased warmth, firmness at the upper aspect between the incision and the stoma. No obvious fluctuance. Erythema with trace edema noted at the lower aspect of the incision as well. No lower extrem edema noted. Staples removed by RN, Louis Matte No drainage noted from incision. No incisional separation noted after staple removal. Erythema marked. See media for photo. Steri strips applied.   Assessment/Plan: 77 year old female with primary peritoneal carcinoma s/p surgery presenting to the office today for post-op follow up and staple removal. Concern for beginnings of incisional cellulitis on examination. No drainage to obtain for culture. Will obtain lab work today. Plan to begin keflex for 7 days. Erythema marked. Patient is advised to call for any changes in erythema, new symptoms such as fever, or needs. A follow up appointment has been made in the office for Monday, Dec 2. Patient has contact information for after hours support. She has been given a handout about dietary choices with an ostomy. Reportable signs and symptoms reviewed. Patient escorted to [facility] to obtain labs. She will be contacted with the results.

## 2022-12-26 NOTE — Telephone Encounter (Signed)
Patient states she was started on Eliquis in may or June. Dr. Pricilla Holm asked her to start back after surgery but was told that if her PCP wanted her to continue we would need to refill for patient.

## 2022-12-26 NOTE — Telephone Encounter (Signed)
Done and in IN box

## 2022-12-26 NOTE — Telephone Encounter (Signed)
I do want her to continue it   I hope she is doing ok through surgery and treatment Let us know if she needs anything else

## 2022-12-26 NOTE — Addendum Note (Signed)
Addended by: Roxy Manns A on: 12/26/2022 01:30 PM   Modules accepted: Orders

## 2022-12-26 NOTE — Telephone Encounter (Signed)
Called patient reviewed all information and repeated back to me. Will call if any questions.  ? ?

## 2022-12-31 ENCOUNTER — Inpatient Hospital Stay: Payer: Medicare Other | Attending: Gynecologic Oncology | Admitting: Gynecologic Oncology

## 2022-12-31 ENCOUNTER — Encounter: Payer: Self-pay | Admitting: Hematology

## 2022-12-31 ENCOUNTER — Telehealth: Payer: Self-pay | Admitting: *Deleted

## 2022-12-31 ENCOUNTER — Inpatient Hospital Stay: Payer: Medicare Other

## 2022-12-31 VITALS — BP 119/60 | HR 95 | Temp 98.5°F | Resp 19 | Wt 160.0 lb

## 2022-12-31 DIAGNOSIS — T8143XA Infection following a procedure, organ and space surgical site, initial encounter: Secondary | ICD-10-CM | POA: Diagnosis not present

## 2022-12-31 DIAGNOSIS — Z933 Colostomy status: Secondary | ICD-10-CM | POA: Diagnosis not present

## 2022-12-31 DIAGNOSIS — Z800 Family history of malignant neoplasm of digestive organs: Secondary | ICD-10-CM | POA: Diagnosis not present

## 2022-12-31 DIAGNOSIS — C482 Malignant neoplasm of peritoneum, unspecified: Secondary | ICD-10-CM | POA: Insufficient documentation

## 2022-12-31 DIAGNOSIS — Z7901 Long term (current) use of anticoagulants: Secondary | ICD-10-CM | POA: Diagnosis not present

## 2022-12-31 DIAGNOSIS — Z86718 Personal history of other venous thrombosis and embolism: Secondary | ICD-10-CM | POA: Diagnosis not present

## 2022-12-31 DIAGNOSIS — Z8249 Family history of ischemic heart disease and other diseases of the circulatory system: Secondary | ICD-10-CM | POA: Diagnosis not present

## 2022-12-31 DIAGNOSIS — L539 Erythematous condition, unspecified: Secondary | ICD-10-CM

## 2022-12-31 DIAGNOSIS — R1084 Generalized abdominal pain: Secondary | ICD-10-CM

## 2022-12-31 DIAGNOSIS — Z833 Family history of diabetes mellitus: Secondary | ICD-10-CM | POA: Diagnosis not present

## 2022-12-31 DIAGNOSIS — Z79899 Other long term (current) drug therapy: Secondary | ICD-10-CM | POA: Diagnosis not present

## 2022-12-31 DIAGNOSIS — Z888 Allergy status to other drugs, medicaments and biological substances status: Secondary | ICD-10-CM | POA: Diagnosis not present

## 2022-12-31 DIAGNOSIS — Z90710 Acquired absence of both cervix and uterus: Secondary | ICD-10-CM | POA: Insufficient documentation

## 2022-12-31 DIAGNOSIS — Z8589 Personal history of malignant neoplasm of other organs and systems: Secondary | ICD-10-CM | POA: Diagnosis not present

## 2022-12-31 DIAGNOSIS — Z808 Family history of malignant neoplasm of other organs or systems: Secondary | ICD-10-CM | POA: Diagnosis not present

## 2022-12-31 DIAGNOSIS — Z90722 Acquired absence of ovaries, bilateral: Secondary | ICD-10-CM | POA: Insufficient documentation

## 2022-12-31 DIAGNOSIS — K651 Peritoneal abscess: Secondary | ICD-10-CM | POA: Diagnosis not present

## 2022-12-31 DIAGNOSIS — Z803 Family history of malignant neoplasm of breast: Secondary | ICD-10-CM | POA: Diagnosis not present

## 2022-12-31 DIAGNOSIS — T8149XA Infection following a procedure, other surgical site, initial encounter: Secondary | ICD-10-CM

## 2022-12-31 DIAGNOSIS — L03311 Cellulitis of abdominal wall: Secondary | ICD-10-CM | POA: Diagnosis not present

## 2022-12-31 DIAGNOSIS — L7634 Postprocedural seroma of skin and subcutaneous tissue following other procedure: Secondary | ICD-10-CM | POA: Diagnosis not present

## 2022-12-31 DIAGNOSIS — K219 Gastro-esophageal reflux disease without esophagitis: Secondary | ICD-10-CM | POA: Diagnosis not present

## 2022-12-31 DIAGNOSIS — Z85828 Personal history of other malignant neoplasm of skin: Secondary | ICD-10-CM | POA: Diagnosis not present

## 2022-12-31 DIAGNOSIS — Z882 Allergy status to sulfonamides status: Secondary | ICD-10-CM | POA: Diagnosis not present

## 2022-12-31 DIAGNOSIS — E785 Hyperlipidemia, unspecified: Secondary | ICD-10-CM | POA: Diagnosis not present

## 2022-12-31 DIAGNOSIS — I10 Essential (primary) hypertension: Secondary | ICD-10-CM | POA: Diagnosis not present

## 2022-12-31 LAB — BASIC METABOLIC PANEL
Anion gap: 7 (ref 5–15)
BUN: 19 mg/dL (ref 8–23)
CO2: 27 mmol/L (ref 22–32)
Calcium: 9.5 mg/dL (ref 8.9–10.3)
Chloride: 104 mmol/L (ref 98–111)
Creatinine, Ser: 0.52 mg/dL (ref 0.44–1.00)
GFR, Estimated: >60 mL/min (ref >60)
Glucose, Bld: 100 mg/dL — ABNORMAL HIGH (ref 70–99)
Potassium: 3.9 mmol/L (ref 3.5–5.1)
Sodium: 138 mmol/L (ref 135–145)

## 2022-12-31 LAB — CBC WITH DIFFERENTIAL (CANCER CENTER ONLY)
Abs Immature Granulocytes: 0.01 10*3/uL (ref 0.00–0.07)
Basophils Absolute: 0 10*3/uL (ref 0.0–0.1)
Basophils Relative: 1 %
Eosinophils Absolute: 0.1 10*3/uL (ref 0.0–0.5)
Eosinophils Relative: 1 %
HCT: 30.1 % — ABNORMAL LOW (ref 36.0–46.0)
Hemoglobin: 9.8 g/dL — ABNORMAL LOW (ref 12.0–15.0)
Immature Granulocytes: 0 %
Lymphocytes Relative: 16 %
Lymphs Abs: 0.9 10*3/uL (ref 0.7–4.0)
MCH: 31.1 pg (ref 26.0–34.0)
MCHC: 32.6 g/dL (ref 30.0–36.0)
MCV: 95.6 fL (ref 80.0–100.0)
Monocytes Absolute: 0.6 10*3/uL (ref 0.1–1.0)
Monocytes Relative: 11 %
Neutro Abs: 4 10*3/uL (ref 1.7–7.7)
Neutrophils Relative %: 71 %
Platelet Count: 361 10*3/uL (ref 150–400)
RBC: 3.15 MIL/uL — ABNORMAL LOW (ref 3.87–5.11)
RDW: 17.6 % — ABNORMAL HIGH (ref 11.5–15.5)
WBC Count: 5.6 10*3/uL (ref 4.0–10.5)
nRBC: 0 % (ref 0.0–0.2)

## 2022-12-31 MED ORDER — DOXYCYCLINE HYCLATE 100 MG PO TABS: 100.0000 mg | ORAL_TABLET | Freq: Two times a day (BID) | ORAL | 0 refills | Status: DC

## 2022-12-31 MED ORDER — METRONIDAZOLE 500 MG PO TABS: 500.0000 mg | ORAL_TABLET | Freq: Three times a day (TID) | ORAL | 0 refills | Status: DC

## 2022-12-31 MED ORDER — CLINDAMYCIN HCL 300 MG PO CAPS: 300.0000 mg | ORAL_CAPSULE | Freq: Three times a day (TID) | ORAL | 0 refills | Status: DC

## 2022-12-31 NOTE — Patient Instructions (Addendum)
We are going to plan on changing antibiotics to clindamycin given worsening in redness of the lower abdominal incision.   On exam today, there was no opening appreciated in the well healed incision. Given this, we will plan on a CT scan to evaluate for an underlying fluid collection.   We also checked lab work today and will notify you of the results.

## 2022-12-31 NOTE — Telephone Encounter (Signed)
[facility] pharmacy was called and verified Rx. For doxy + flagyl. Clindamycin Rx canceled. Advised they will have Rx ready for patient today.

## 2023-01-01 ENCOUNTER — Telehealth: Payer: Self-pay | Admitting: Gynecologic Oncology

## 2023-01-01 ENCOUNTER — Encounter: Payer: Self-pay | Admitting: Hematology

## 2023-01-01 ENCOUNTER — Encounter (HOSPITAL_COMMUNITY): Payer: Self-pay

## 2023-01-01 ENCOUNTER — Ambulatory Visit (HOSPITAL_COMMUNITY)
Admission: RE | Admit: 2023-01-01 | Discharge: 2023-01-01 | Discharge status: Home / Self Care | Payer: Medicare Other | Source: Ambulatory Visit | Attending: Gynecologic Oncology | Admitting: Gynecologic Oncology

## 2023-01-01 ENCOUNTER — Telehealth: Payer: Self-pay | Admitting: *Deleted

## 2023-01-01 ENCOUNTER — Other Ambulatory Visit: Payer: Self-pay | Admitting: Gynecologic Oncology

## 2023-01-01 ENCOUNTER — Inpatient Hospital Stay (HOSPITAL_COMMUNITY)
Admission: AD | Admit: 2023-01-01 | Discharge: 2023-01-09 | DRG: 862 | Disposition: A | Discharge status: Home Health | Payer: Medicare Other | Source: Other Acute Inpatient Hospital | Attending: Gynecologic Oncology | Admitting: Gynecologic Oncology

## 2023-01-01 DIAGNOSIS — R180 Malignant ascites: Secondary | ICD-10-CM

## 2023-01-01 DIAGNOSIS — R1084 Generalized abdominal pain: Secondary | ICD-10-CM | POA: Insufficient documentation

## 2023-01-01 DIAGNOSIS — Z833 Family history of diabetes mellitus: Secondary | ICD-10-CM | POA: Diagnosis not present

## 2023-01-01 DIAGNOSIS — Z8249 Family history of ischemic heart disease and other diseases of the circulatory system: Secondary | ICD-10-CM | POA: Diagnosis not present

## 2023-01-01 DIAGNOSIS — L03311 Cellulitis of abdominal wall: Secondary | ICD-10-CM | POA: Diagnosis present

## 2023-01-01 DIAGNOSIS — C800 Disseminated malignant neoplasm, unspecified: Secondary | ICD-10-CM

## 2023-01-01 DIAGNOSIS — Z803 Family history of malignant neoplasm of breast: Secondary | ICD-10-CM

## 2023-01-01 DIAGNOSIS — L0390 Cellulitis, unspecified: Secondary | ICD-10-CM | POA: Diagnosis present

## 2023-01-01 DIAGNOSIS — Z808 Family history of malignant neoplasm of other organs or systems: Secondary | ICD-10-CM | POA: Diagnosis not present

## 2023-01-01 DIAGNOSIS — Z8042 Family history of malignant neoplasm of prostate: Secondary | ICD-10-CM

## 2023-01-01 DIAGNOSIS — Z8543 Personal history of malignant neoplasm of ovary: Secondary | ICD-10-CM | POA: Diagnosis not present

## 2023-01-01 DIAGNOSIS — T8149XA Infection following a procedure, other surgical site, initial encounter: Secondary | ICD-10-CM | POA: Insufficient documentation

## 2023-01-01 DIAGNOSIS — K651 Peritoneal abscess: Secondary | ICD-10-CM | POA: Diagnosis present

## 2023-01-01 DIAGNOSIS — Z800 Family history of malignant neoplasm of digestive organs: Secondary | ICD-10-CM

## 2023-01-01 DIAGNOSIS — K219 Gastro-esophageal reflux disease without esophagitis: Secondary | ICD-10-CM | POA: Diagnosis present

## 2023-01-01 DIAGNOSIS — Z882 Allergy status to sulfonamides status: Secondary | ICD-10-CM | POA: Diagnosis not present

## 2023-01-01 DIAGNOSIS — Z79899 Other long term (current) drug therapy: Secondary | ICD-10-CM

## 2023-01-01 DIAGNOSIS — T8143XA Infection following a procedure, organ and space surgical site, initial encounter: Principal | ICD-10-CM | POA: Diagnosis present

## 2023-01-01 DIAGNOSIS — I10 Essential (primary) hypertension: Secondary | ICD-10-CM | POA: Diagnosis not present

## 2023-01-01 DIAGNOSIS — Z8589 Personal history of malignant neoplasm of other organs and systems: Secondary | ICD-10-CM | POA: Diagnosis not present

## 2023-01-01 DIAGNOSIS — Z933 Colostomy status: Secondary | ICD-10-CM | POA: Diagnosis not present

## 2023-01-01 DIAGNOSIS — E669 Obesity, unspecified: Secondary | ICD-10-CM | POA: Diagnosis present

## 2023-01-01 DIAGNOSIS — Z86718 Personal history of other venous thrombosis and embolism: Secondary | ICD-10-CM | POA: Diagnosis not present

## 2023-01-01 DIAGNOSIS — Y838 Other surgical procedures as the cause of abnormal reaction of the patient, or of later complication, without mention of misadventure at the time of the procedure: Secondary | ICD-10-CM | POA: Diagnosis present

## 2023-01-01 DIAGNOSIS — R110 Nausea: Secondary | ICD-10-CM

## 2023-01-01 DIAGNOSIS — Z888 Allergy status to other drugs, medicaments and biological substances status: Secondary | ICD-10-CM

## 2023-01-01 DIAGNOSIS — R638 Other symptoms and signs concerning food and fluid intake: Secondary | ICD-10-CM

## 2023-01-01 DIAGNOSIS — Y813 Surgical instruments, materials and general- and plastic-surgery devices (including sutures) associated with adverse incidents: Secondary | ICD-10-CM | POA: Diagnosis present

## 2023-01-01 DIAGNOSIS — T8141XD Infection following a procedure, superficial incisional surgical site, subsequent encounter: Secondary | ICD-10-CM

## 2023-01-01 DIAGNOSIS — L02211 Cutaneous abscess of abdominal wall: Secondary | ICD-10-CM | POA: Diagnosis not present

## 2023-01-01 DIAGNOSIS — Z7901 Long term (current) use of anticoagulants: Secondary | ICD-10-CM

## 2023-01-01 DIAGNOSIS — Z83719 Family history of colon polyps, unspecified: Secondary | ICD-10-CM | POA: Diagnosis not present

## 2023-01-01 DIAGNOSIS — T8142XA Infection following a procedure, deep incisional surgical site, initial encounter: Secondary | ICD-10-CM

## 2023-01-01 DIAGNOSIS — K449 Diaphragmatic hernia without obstruction or gangrene: Secondary | ICD-10-CM | POA: Diagnosis not present

## 2023-01-01 DIAGNOSIS — B9689 Other specified bacterial agents as the cause of diseases classified elsewhere: Secondary | ICD-10-CM | POA: Diagnosis present

## 2023-01-01 DIAGNOSIS — B952 Enterococcus as the cause of diseases classified elsewhere: Secondary | ICD-10-CM | POA: Diagnosis present

## 2023-01-01 DIAGNOSIS — E785 Hyperlipidemia, unspecified: Secondary | ICD-10-CM | POA: Diagnosis present

## 2023-01-01 DIAGNOSIS — L539 Erythematous condition, unspecified: Secondary | ICD-10-CM

## 2023-01-01 DIAGNOSIS — F4321 Adjustment disorder with depressed mood: Secondary | ICD-10-CM

## 2023-01-01 DIAGNOSIS — R519 Headache, unspecified: Secondary | ICD-10-CM

## 2023-01-01 DIAGNOSIS — K5900 Constipation, unspecified: Secondary | ICD-10-CM

## 2023-01-01 DIAGNOSIS — Z9889 Other specified postprocedural states: Secondary | ICD-10-CM | POA: Diagnosis not present

## 2023-01-01 DIAGNOSIS — R630 Anorexia: Secondary | ICD-10-CM

## 2023-01-01 DIAGNOSIS — L7634 Postprocedural seroma of skin and subcutaneous tissue following other procedure: Secondary | ICD-10-CM | POA: Diagnosis present

## 2023-01-01 DIAGNOSIS — C579 Malignant neoplasm of female genital organ, unspecified: Secondary | ICD-10-CM | POA: Diagnosis not present

## 2023-01-01 DIAGNOSIS — R141 Gas pain: Secondary | ICD-10-CM

## 2023-01-01 DIAGNOSIS — C786 Secondary malignant neoplasm of retroperitoneum and peritoneum: Secondary | ICD-10-CM | POA: Diagnosis not present

## 2023-01-01 DIAGNOSIS — T8140XA Infection following a procedure, unspecified, initial encounter: Secondary | ICD-10-CM | POA: Diagnosis present

## 2023-01-01 DIAGNOSIS — Z85828 Personal history of other malignant neoplasm of skin: Secondary | ICD-10-CM | POA: Diagnosis not present

## 2023-01-01 MED ORDER — ENOXAPARIN SODIUM 40 MG/0.4ML IJ SOSY: 40.0000 mg | PREFILLED_SYRINGE | INTRAMUSCULAR | Status: DC

## 2023-01-01 MED ORDER — SIMETHICONE 80 MG PO CHEW
80.0000 mg | CHEWABLE_TABLET | Freq: Four times a day (QID) | ORAL | Status: DC | PRN
Start: 2023-01-01 — End: 2023-01-02

## 2023-01-01 MED ORDER — SODIUM CHLORIDE 0.9% FLUSH: 3.0000 mL | Freq: Two times a day (BID) | INTRAVENOUS | Status: DC

## 2023-01-01 MED ORDER — VANCOMYCIN HCL IN DEXTROSE 1-5 GM/200ML-% IV SOLN: 1000.0000 mg | Freq: Two times a day (BID) | INTRAVENOUS | Status: DC

## 2023-01-01 MED ORDER — CHLORHEXIDINE GLUCONATE CLOTH 2 % EX PADS
6.0000 | MEDICATED_PAD | Freq: Every day | CUTANEOUS | Status: DC
  Administered 2023-01-02: 6 via TOPICAL

## 2023-01-01 MED ORDER — ROSUVASTATIN CALCIUM 20 MG PO TABS
20.0000 mg | ORAL_TABLET | Freq: Every day | ORAL | Status: DC
  Administered 2023-01-01 – 2023-01-08 (×8): 20 mg via ORAL
  Filled 2023-01-01: qty 2
  Filled 2023-01-01 (×3): qty 1
  Filled 2023-01-01: qty 2
  Filled 2023-01-01 (×2): qty 1
  Filled 2023-01-01: qty 2

## 2023-01-01 MED ORDER — IOHEXOL 300 MG/ML  SOLN
100.0000 mL | Freq: Once | INTRAMUSCULAR | Status: AC | PRN
  Administered 2023-01-01: 100 mL via INTRAVENOUS

## 2023-01-01 MED ORDER — ONDANSETRON HCL 4 MG/2ML IJ SOLN
4.0000 mg | Freq: Four times a day (QID) | INTRAMUSCULAR | Status: DC | PRN
Start: 2023-01-01 — End: 2023-01-09
  Administered 2023-01-02 – 2023-01-04 (×3): 4 mg via INTRAVENOUS
  Filled 2023-01-01 (×3): qty 2

## 2023-01-01 MED ORDER — TRAZODONE HCL 50 MG PO TABS: 50.0000 mg | ORAL_TABLET | Freq: Every evening | ORAL | Status: DC | PRN

## 2023-01-01 MED ORDER — VANCOMYCIN HCL IN DEXTROSE 1-5 GM/200ML-% IV SOLN
1000.0000 mg | INTRAVENOUS | Status: DC
  Administered 2023-01-01 – 2023-01-02 (×2): 1000 mg via INTRAVENOUS
  Filled 2023-01-01 (×2): qty 200

## 2023-01-01 MED ORDER — PIPERACILLIN-TAZOBACTAM 3.375 G IVPB
3.3750 g | Freq: Three times a day (TID) | INTRAVENOUS | Status: DC
  Administered 2023-01-02 – 2023-01-03 (×5): 3.375 g via INTRAVENOUS
  Filled 2023-01-01 (×5): qty 50

## 2023-01-01 MED ORDER — TRAMADOL HCL 50 MG PO TABS: 50.0000 mg | ORAL_TABLET | Freq: Four times a day (QID) | ORAL | 0 refills | Status: DC | PRN

## 2023-01-01 MED ORDER — SODIUM CHLORIDE 0.9% FLUSH
10.0000 mL | Freq: Two times a day (BID) | INTRAVENOUS | Status: DC
  Administered 2023-01-02: 10 mL via INTRAVENOUS

## 2023-01-01 MED ORDER — SODIUM CHLORIDE 0.9 % IV SOLN
250.0000 mL | INTRAVENOUS | Status: DC | PRN
Start: 2023-01-01 — End: 2023-01-01

## 2023-01-01 MED ORDER — SODIUM CHLORIDE 0.9% FLUSH
10.0000 mL | Freq: Two times a day (BID) | INTRAVENOUS | Status: DC
Start: 2023-01-02 — End: 2023-01-02

## 2023-01-01 MED ORDER — OXYCODONE-ACETAMINOPHEN 5-325 MG PO TABS
1.0000 | ORAL_TABLET | ORAL | Status: DC | PRN
  Administered 2023-01-04 (×2): 1 via ORAL
  Filled 2023-01-01 (×2): qty 1
  Filled 2023-01-01: qty 2
  Filled 2023-01-01: qty 1

## 2023-01-01 MED ORDER — SODIUM CHLORIDE 0.9% FLUSH
3.0000 mL | INTRAVENOUS | Status: DC | PRN
Start: 2023-01-01 — End: 2023-01-01

## 2023-01-01 MED ORDER — ACETAMINOPHEN 325 MG PO TABS
650.0000 mg | ORAL_TABLET | ORAL | Status: DC | PRN
Start: 2023-01-01 — End: 2023-01-02

## 2023-01-01 MED ORDER — ONDANSETRON HCL 4 MG PO TABS: 4.0000 mg | ORAL_TABLET | Freq: Four times a day (QID) | ORAL | Status: DC | PRN

## 2023-01-01 NOTE — Telephone Encounter (Signed)
Per provider moved appty from 12/20 to 12/6, patient aware

## 2023-01-01 NOTE — Telephone Encounter (Signed)
Spoke with Ms. Fosnaugh who states she is not feeling well. Pt rates her pain 5/10 and took a tramadol this morning and only has 1 pill left. Pt denies fever and or chills. Pt states lower portion of her incision is looking darker pink today than it was yesterday. Upper portion of incision is less pink today. Pt states ostomy has decreased stool output since Sunday, "the stool is runny". Pt is trying to increase solid food intake, she had eggs and toast this morning. Food intake makes her stomach cramp. Pt is also staying well hydrated and drinking ensure protein max. Pt continues to take her antibiotics and started the flagyl and doxycycline yesterday evening. Pt is scheduled for Ct scan at 5 pm today. Advised patient message would be relayed to providers. Pt thanked the office for calling.

## 2023-01-01 NOTE — Progress Notes (Signed)
GYN Oncology Post-operative Follow Up  Treatment History:    Oncology History  Carcinomatosis (HCC)  08/31/2022 Initial Diagnosis    Carcinomatosis (HCC)    09/12/2022 -  Chemotherapy    Patient is on Treatment Plan : OVARIAN Carboplatin (AUC 6) + Paclitaxel (175) q21d X 6 Cycles       The patient initially presented to the emergency department on 7/8 with a couple months of worsening abdominal distention.  She had previously had outpatient ultrasound which showed mild ascites, further workup was negative for cirrhosis (elastography).  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  Diverticulosis of the descending colon and sigmoid colon noted with moderate wall thickening of the distal sigmoid colon, possibly sequela of prior diverticulitis versus acute inflammation or underlying malignancy.  Small bilateral pleural effusions with adjacent subsegmental atelectasis noted.   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   She underwent colonoscopy on 09/07/2022 with apparent extraluminal compression in the distal sigmoid colon, approximately 20 cm from the anal verge.  Mucosa in his area was edematous and biopsied.  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   CA-125 on 7/28 was 7,356. CEA was normal (1). C4 carboplatin/taxol on 11/14/22. CA 125 at that time was 4,552.  CT scan on 11/07/2022 returned with: 1. Small to moderate volume ascites, decreased. 2. Diffuse omental soft tissue caking and thickening and enhancement of the right deep pelvic peritoneum, compatible with peritoneal carcinomatosis, not substantially changed from 08/23/2022 CT. 3. Small to moderate posterior left pleural effusion, increased. Trace posterior right  pleural effusion is stable. 4. No lymphadenopathy or additional findings of metastatic disease in the chest, abdomen or pelvis. 5. Chronic findings include: Three-vessel coronary atherosclerosis. Moderate hiatal hernia. Marked left colonic diverticulosis. Small cystocele. Aortic Atherosclerosis.  Pre-operatively, she did undergo a chest xray on 11/30/2022 along with a thoracentesis on 12/10/2022 with 1.4 L removed with cytology returning with adenocarcinoma.  On 12/12/2022, she underwent diagnostic laparoscopy, conversion to exploratory laparotomy with radical tumor debulking including total omentectomy, resection of peritoneal disease, small bowel resection with 3 anastomosis, en bloc resection of the left fallopian tube and ovary as well as rectosigmoid colon, left ureterolysis, right salpingo-oophorectomy, end descending colostomy, cystoscopy with Dr. Eugene Garnet. Her hospital admission was from 12/12/2022 to 12/21/2022.   She presented to the office on 12/24/2022 for post-operative follow up and staple removal. At that time, her abdominal incision was erythematous with concerns for early incisional cellulitis. She was started on keflex at that time. Labs were obtained with WBC count at 6.3, Hgb 8.6, Hct 26.5. The incision remained intact after staple removal and there was no drainage to obtain for culture. Follow up was arranged one week later for follow up.  Interval: She presents today to the office with her son for continued post-operative follow up and incision assessment after one week of oral antibiotic use, keflex. She reports having a decreased appetite. Nausea is intermittent, no emesis. Ate small piece of ham with small amount of sides at Thanksgiving and reports having severe abdominal cramping after. Her ostomy is having brown output with flatus. Emptying her bladder without difficulty. Feels fatigued. Her son feels she may be doing too much at home. Starting yesterday, she noted an  increase in erythema at the lower aspect of her  incision. She feels the erythema at the upper aspect of the incision has receded. No drainage noted from incision. No fever or chills reported but stays cold per pt.       Exam: Alert, oriented, in no acute distress.  Lungs clear. Heart regular in rate and rhythm. Abdomen is soft, non-tender, non-distended. Ostomy appliance is intact with flatus and loose brown stool in bag. Stoma is pink/red. Midline abdominal incision with increased erythema, increased warmth, firmness at the lower aspect of the incision. Erythema has extended past the previously marked borders. Erythema at the upper aspect of the incision has decreased. No lower extrem edema noted. No drainage noted from incision. After incisional probing, incision is well healed with no separation or open aspect created for culture or fluid expression. See media for photo.      Latest Ref Rng & Units 12/31/2022    3:51 PM 12/24/2022    5:01 PM 12/21/2022    7:50 AM  CBC  WBC 4.0 - 10.5 K/uL 5.6  6.3  5.2   Hemoglobin 12.0 - 15.0 g/dL 9.8  8.6  7.7   Hematocrit 36.0 - 46.0 % 30.1  26.5  23.2   Platelets 150 - 400 K/uL 361  404  283        Latest Ref Rng & Units 12/31/2022    3:51 PM 12/24/2022    5:01 PM 12/20/2022    3:34 AM  BMP  Glucose 70 - 99 mg/dL 098  119  147   BUN 8 - 23 mg/dL 19  13  8    Creatinine 0.44 - 1.00 mg/dL 8.29  5.62  1.30   Sodium 135 - 145 mmol/L 138  138  134   Potassium 3.5 - 5.1 mmol/L 3.9  3.8  3.6   Chloride 98 - 111 mmol/L 104  104  101   CO2 22 - 32 mmol/L 27  24  25    Calcium 8.9 - 10.3 mg/dL 9.5  9.4  8.4      Assessment/Plan: 77 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 Worsening erythema of the lower aspect of the incision. No drainage to obtain for culture. Lab work obtained. Situation reviewed with Dr. Clide Cliff. Plan for stat CT scan to evaluate  for underlying fluid collection. Erythema marked-see media. Labs obtained with CBC discussed with patient. She will be contacted with the results of her Bmet. Patient is advised to call for any changes, new symptoms such as fever, or needs before her CT which is scheduled for tomorrow at 5 pm. The patient has a follow up appointment. Patient has contact information for after hours support.

## 2023-01-01 NOTE — H&P (Incomplete)
Rachel Carlson is an 77 y.o. female.   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  On 12/24/2022 she had a routine post-operative follow up and staple removal. At that time, her abdominal incision was erythematous with concerns for early incisional cellulitis. She was started on keflex at that time. Labs were obtained with WBC count at 6.3, Hgb 8.6, Hct 26.5. The incision remained intact after staple removal and there was no drainage to obtain for culture. Follow up was arranged one week later for follow up. At the follow-up visit on the day of admission the exam in the office was remarkable for worsening cellulitis.  The also complained of loss of appetite, fatigue.  She denies any fever or chills.     Past Medical History:  Diagnosis Date  . Blood transfusion without reported diagnosis   . Chronic right ear pain    normal MRI  . DVT of leg (deep venous thrombosis) (HCC)   . Esophageal stricture   . Family history of breast cancer   . Family history of colon cancer   . Family history of melanoma   . Family history of prostate cancer   . Fibroids 1998   uterine, history of (left ovaries)  . GERD (gastroesophageal reflux disease)   . History of hiatal hernia   . History of uterine prolapse   . Hx of adenomatous polyp of colon 10/19/2014  . Hyperlipidemia   . Hypertension    borderline no meds diet and exercise  . 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 Obesity   . Ovarian cancer (HCC)   . PVC (premature ventricular contraction)    first dx by holter in 1980's, echo (6/10) EF 55-60%, normal diastolic fxn, normal size RV and fxn, mild MR, PASP  . Squamous cell carcinoma of arm    ovarian  . Tubal pregnancy   . Ulcerative proctosigmoiditis (HCC)   . Urge urinary incontinence 06/03/2007   Qualifier: Diagnosis of   By: Lindwood Qua CMA, Jerl Santos      Past Surgical History:  Procedure Laterality Date  . BILATERAL SALPINGECTOMY Bilateral 12/12/2022   Procedure: OPEN BILATERAL SALPINGECTOMY-OOPHORECTOMY;  Surgeon: Carver Fila, MD;  Location: [facility];  Service: Gynecology;  Laterality: Bilateral;  . BLADDER NECK SUSPENSION  1998  . BREAST BIOPSY Left   . BREAST LUMPECTOMY Left    benign  . CATARACT EXTRACTION Right 01/2020  . CATARACT EXTRACTION W/PHACO Left 08/12/2020   Procedure: CATARACT EXTRACTION PHACO AND INTRAOCULAR LENS PLACEMENT LEFT EYE;  Surgeon: Fabio Pierce, MD;  Location: [facility];  Service: Ophthalmology;  Laterality: Left;  left CDE=10.71  . COLONOSCOPY  05/18/2011/10/12/14   Dr. Stan Head  . DEBULKING N/A 12/12/2022   Procedure: RADICAL TUMOR DEBULKING, PERITONEAL STRIPPING, MOBILIZATION SPLENIC FLEXURE, RECTOSIGMOID RESECTION,SMALL BOWEL RESECTION AND REANASTOMOSIS ,END TO END DESCENDING COLOSTOMY;  Surgeon: Carver Fila, MD;  Location: [facility];  Service: Gynecology;  Laterality: N/A;  . ECTOPIC PREGNANCY SURGERY  1980  . ESOPHAGOGASTRODUODENOSCOPY (EGD) WITH ESOPHAGEAL DILATION  04/03/2012   Dr. Stan Head  . IR IMAGING GUIDED PORT INSERTION  09/06/2022  . OMENTECTOMY N/A 12/12/2022   Procedure: OMENTECTOMY;  Surgeon: Carver Fila, MD;  Location: [facility];  Service: Gynecology;  Laterality: N/A;  . PARTIAL HYSTERECTOMY     abdominal, fibroids, and prolapse (1998) bladder tack  . SALPINGOOPHORECTOMY Bilateral  12/12/2022   Procedure: DIAGNOSTIC LAPAROSCOPY;  Surgeon: Carver Fila, MD;  Location: [facility];  Service: Gynecology;  Laterality: Bilateral;  . SKIN CANCER EXCISION    . UPPER GASTROINTESTINAL ENDOSCOPY      Family History  Problem Relation Age of Onset  . Sudden death Father 32       "coronary arteriosclerosis" on death certificate  . Heart attack Mother 67  . Transient ischemic attack Mother   . Diabetes type II Mother   . Sudden death Mother         died age 73  . Diabetes Mother   . Coronary artery disease Mother   . Skin cancer Mother   . Barrett's esophagus Mother   . Arrhythmia Brother   . Colon cancer Brother 62  . Breast cancer Sister        dx in her 1s  . Colon polyps Sister        adenomatous  . Colon cancer Son 49       lynch syndrome  . Melanoma Maternal Uncle   . Congestive Heart Failure Maternal Grandmother   . Prostate cancer Maternal Grandfather        dx in 66s  . Tuberculosis Paternal Grandfather   . Skin cancer Maternal Aunt   . Barrett's esophagus Maternal Aunt   . Other Son        parotid gland tumor  . Ehlers-Danlos syndrome Daughter   . Colon cancer Cousin        mid 77s; maternal cousin  . Colon cancer Other        MGMs brother  . Esophageal cancer Neg Hx   . Stomach cancer Neg Hx   . Rectal cancer Neg Hx    Social History:  reports that she has never smoked. She has never used smokeless tobacco. She reports that she does not drink alcohol and does not use drugs.  Allergies:  Allergies  Allergen Reactions  . Aspartame     Intolerance to artifical sweeteners, pt states caused burning of bladder  . Pantoprazole Sodium Diarrhea    Abdominal pain  . Ranitidine Diarrhea    abdominal pain  . Omeprazole Diarrhea    Abdominal pain  . Sulfasalazine Rash and Other (See Comments)    Fever, chills, headache, muscle pain also    Facility-Administered Medications Prior to Admission  Medication Dose Route Frequency Provider Last Rate Last Admin  . botulinum toxin Type A (BOTOX) injection 200 Units  200 Units Intramuscular Once        Medications Prior to Admission  Medication Sig Dispense Refill  . apixaban (ELIQUIS) 5 MG TABS tablet Take 1 tablet (5 mg total) by mouth 2 (two) times daily. 180 tablet 0  . CARBOPLATIN IV Inject into the vein every 21 ( twenty-one) days. port    . doxycycline (VIBRA-TABS) 100 MG tablet Take 1 tablet (100 mg total) by mouth 2 (two) times daily. 20 tablet 0   . ketoconazole (NIZORAL) 2 % cream Apply 1 Application topically daily. To yeast rash (Patient taking differently: Apply 1 Application topically daily as needed for irritation.) 30 g 1  . lidocaine-prilocaine (EMLA) cream Apply to affected area once (Patient taking differently: Apply 1 Application topically daily as needed (prior to port access).) 30 g 3  . metroNIDAZOLE (FLAGYL) 500 MG tablet Take 1 tablet (500 mg total) by mouth 3 (three) times daily. 30 tablet 0  . ondansetron (ZOFRAN) 8 MG tablet Take 1 tablet (8 mg  total) by mouth every 8 (eight) hours as needed for nausea or vomiting. Start on the third day after carboplatin. 30 tablet 1  . PACLITAXEL IV Inject into the vein every 21 ( twenty-one) days. port    . Polyethyl Glycol-Propyl Glycol (SYSTANE OP) Place 1 drop into both eyes 4 (four) times daily as needed (for dryness).    Marland Kitchen PREVACID 24HR 15 MG capsule Take 15 mg by mouth See admin instructions. Take 15 mg by mouth in the morning before any food and an additional 15 mg once a day as needed for unresolved reflux    . prochlorperazine (COMPAZINE) 10 MG tablet Take 10 mg by mouth every 6 (six) hours as needed for nausea or vomiting.    . rosuvastatin (CRESTOR) 20 MG tablet Take 1 tablet (20 mg total) by mouth at bedtime. 90 tablet 3  . traMADol (ULTRAM) 50 MG tablet Take 1 tablet (50 mg total) by mouth every 6 (six) hours as needed for severe pain (pain score 7-10). For AFTER surgery only, do not take and drive 15 tablet 0  . traZODone (DESYREL) 50 MG tablet Take 1 tablet (50 mg total) by mouth at bedtime as needed for sleep. 30 tablet 1  . Ascorbic Acid (VITAMIN C) 1000 MG tablet Take 1,000 mg by mouth daily. (Patient not taking: Reported on 01/01/2023)    . calcium citrate-vitamin D (CITRACAL+D) 315-200 MG-UNIT per tablet Take 1 tablet by mouth 2 (two) times daily. (Patient not taking: Reported on 01/01/2023)    . Cholecalciferol (VITAMIN D-3) 5000 UNITS TABS Take 5,000 Units by mouth  daily. (Patient not taking: Reported on 01/01/2023)    . Coenzyme Q10 (CO Q-10) 200 MG CAPS Take 200 mg by mouth daily. (Patient not taking: Reported on 01/01/2023)    . CRANBERRY PO Take 650 mg by mouth daily. (Patient not taking: Reported on 01/01/2023)    . Glucosamine-Chondroitin (GLUCOSAMINE CHONDR COMPLEX PO) Take 2 capsules by mouth daily.  (Patient not taking: Reported on 01/01/2023)    . GRAPE SEED EXTRACT PO Take 1 capsule by mouth daily. (Patient not taking: Reported on 01/01/2023)    . lansoprazole (PREVACID) 15 MG capsule Take 1 capsule (15 mg total) by mouth 2 (two) times daily before a meal. (Patient not taking: Reported on 01/01/2023)    . milk thistle 175 MG tablet Take 175 mg by mouth daily. (Patient not taking: Reported on 01/01/2023)    . Omega-3 Fatty Acids (FISH OIL TRIPLE STRENGTH PO) Take 1 capsule by mouth daily. (Patient not taking: Reported on 01/01/2023)    . phenazopyridine (PYRIDIUM) 200 MG tablet Take 1 tablet (200 mg total) by mouth 3 (three) times daily as needed for pain. (Patient not taking: Reported on 01/01/2023) 10 tablet 0  . vitamin B-12 (CYANOCOBALAMIN) 1000 MCG tablet Take 1,000 mcg by mouth daily. (Patient not taking: Reported on 01/01/2023)    . zinc gluconate 50 MG tablet Take 50 mg by mouth daily. (Patient not taking: Reported on 01/01/2023)      Results for orders placed or performed in visit on 12/31/22 (from the past 48 hour(s))  Basic metabolic panel     Status: Abnormal   Collection Time: 12/31/22  3:51 PM  Result Value Ref Range   Sodium 138 135 - 145 mmol/L   Potassium 3.9 3.5 - 5.1 mmol/L   Chloride 104 98 - 111 mmol/L   CO2 27 22 - 32 mmol/L   Glucose, Bld 100 (H) 70 - 99 mg/dL  Comment: Glucose reference range applies only to samples taken after fasting for at least 8 hours.   BUN 19 8 - 23 mg/dL   Creatinine, Ser 9.56 0.44 - 1.00 mg/dL   Calcium 9.5 8.9 - 21.3 mg/dL   GFR, Estimated >08 >65 mL/min    Comment: (NOTE) Calculated using the  CKD-EPI Creatinine Equation (2021)    Anion gap 7 5 - 15    Comment: Performed at [facility], [address] [place], [place] [address]  CBC with Differential (Cancer Center Only)     Status: Abnormal   Collection Time: 12/31/22  3:51 PM  Result Value Ref Range   WBC Count 5.6 4.0 - 10.5 K/uL   RBC 3.15 (L) 3.87 - 5.11 MIL/uL   Hemoglobin 9.8 (L) 12.0 - 15.0 g/dL   HCT 62.9 (L) 52.8 - 41.3 %   MCV 95.6 80.0 - 100.0 fL   MCH 31.1 26.0 - 34.0 pg   MCHC 32.6 30.0 - 36.0 g/dL   RDW 24.4 (H) 01.0 - 27.2 %   Platelet Count 361 150 - 400 K/uL   nRBC 0.0 0.0 - 0.2 %   Neutrophils Relative % 71 %   Neutro Abs 4.0 1.7 - 7.7 K/uL   Lymphocytes Relative 16 %   Lymphs Abs 0.9 0.7 - 4.0 K/uL   Monocytes Relative 11 %   Monocytes Absolute 0.6 0.1 - 1.0 K/uL   Eosinophils Relative 1 %   Eosinophils Absolute 0.1 0.0 - 0.5 K/uL   Basophils Relative 1 %   Basophils Absolute 0.0 0.0 - 0.1 K/uL   Immature Granulocytes 0 %   Abs Immature Granulocytes 0.01 0.00 - 0.07 K/uL    Comment: Performed at [facility], [address], [place], [place] [address]   CT ABDOMEN PELVIS W CONTRAST  Result Date: 01/01/2023 CLINICAL DATA:  Postoperative abdominal pain with worsening cellulitis surround the abdominal incision site. History of recent surgery for ovarian cancer and peritoneal carcinomatosis. EXAM: CT ABDOMEN AND PELVIS WITH CONTRAST TECHNIQUE: Multidetector CT imaging of the abdomen and pelvis was performed using the standard protocol following bolus administration of intravenous contrast. RADIATION DOSE REDUCTION: This exam was performed according to the departmental dose-optimization program which includes automated exposure control, adjustment of the mA and/or kV according to patient size and/or use of iterative reconstruction technique. CONTRAST:  OMNIPAQUE IOHEXOL 300 MG/ML  SOLN COMPARISON:  CT scan 11/07/2022 FINDINGS: Lower chest: Persistent  moderate-sized left pleural effusion and small right pleural effusion with overlying atelectasis. Stable large hiatal hernia. No pulmonary nodules or enhancing pleural nodules. Hepatobiliary: No hepatic lesions or intrahepatic biliary dilatation. No obvious peritoneal surface disease. The gallbladder is moderately contracted. No common bile duct dilatation. Pancreas: No mass, inflammation or ductal dilatation. Spleen: Normal size. No focal lesions or peritoneal surface disease. Adrenals/Urinary Tract: The adrenal glands and kidneys are unremarkable. Bladder is grossly normal. Stomach/Bowel: Extensive surgical changes with small bowel anastomosis in the pelvis and a Hartmann's pouch and left sided colostomy. The stomach, duodenum, small bowel and colon are grossly normal. No acute inflammatory process or obstructive findings. Vascular/Lymphatic: Atherosclerotic calcifications involving the aorta and branch vessels but no aneurysm or dissection. The major venous structures are patent. Small scattered mesenteric and retroperitoneal lymph nodes but no mass or overt adenopathy. I do not see any obvious residual omental disease or obvious peritoneal implants. Reproductive: Surgically absent. Other: Mild mesenteric interstitial changes and a small amount of free pelvic fluid.  There is a very thin slightly elongated rim enhancing fluid collection in the anterior abdomen adjacent to the liver on image number 30/2 measuring a maximum of 4 cm. This could be a small intra-abdominal abscess. There is a tiny focus of free air nearby laterally and also medially. There is a superficial abscess noted in the subcutaneous fat with shaggy rim like enhancement and some associated gas. This measures approximately 5.1 x 2.3 x 3.4 cm and is just below the umbilicus. Associated surrounding skin thickening and cellulitis. Musculoskeletal: No significant bony findings. IMPRESSION: 1. 5.1 x 2.3 x 3.4 cm superficial abscess in the subcutaneous  fat just below the umbilicus with surrounding skin thickening and cellulitis. 2. 4 cm thin elongated intra-abdominal abscess anteriorly near the liver. This appears to be too small to drain percutaneously. 3. Extensive surgical changes with small bowel anastomosis in the pelvis and a left-sided colostomy. No acute bowel findings. 4. Persistent moderate-sized left pleural effusion and small right pleural effusion with overlying atelectasis. 5. Stable hiatal hernia. 6. No obvious residual omental disease or peritoneal implants. Aortic Atherosclerosis (ICD10-I70.0). Electronically Signed   By: Rudie Meyer M.D.   On: 01/01/2023 18:33    Review of Systems  Constitutional:  Positive for appetite change and fatigue. Negative for chills and fever.  Gastrointestinal:  Positive for nausea. Negative for vomiting.    Blood pressure (!) 144/80, pulse 83, temperature 98 F (36.7 C), temperature source Oral, resp. rate 14, last menstrual period 05/29/1996, SpO2 95%. Physical Exam  Exam in th office: by Warner Mccreedy NP:  Alert, oriented, in no acute distress.  Lungs clear. Heart regular in rate and rhythm. Abdomen is soft, non-tender, non-distended. Ostomy appliance is intact with flatus and loose brown stool in bag. Stoma is pink/red. Midline abdominal incision with increased erythema, increased warmth, firmness at the lower aspect of the incision. Erythema has extended past the previously marked borders. Erythema at the upper aspect of the incision has decreased. No lower extrem edema noted. No drainage noted from incision. 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 Imaging also suggestive of an intra-abdominal infectious process--not felt to be amenable to image-guided drainage -Admit -Broad spectrum abx to  cover MRSA -I&D of wound  Antionette Char, MD 01/01/2023, 11:36 PM

## 2023-01-01 NOTE — Progress Notes (Signed)
MD Tamela Oddi was notified that patient arrived to the floor.

## 2023-01-01 NOTE — Progress Notes (Signed)
Called to check-in with patient.  She states "I wish I was feeling better."  She reports feeling poorly and describes this as having a feeling like she had major surgery.  She thought she would have been progressing further by now.  She has been trying to eat more but reports having abdominal cramping after eating.  She does have flatus and 2 small bowel movements in her ostomy.  She reports the lower abdominal incision erythema has not spread beyond the marked areas from yesterday's appointment.  She states she feels okay if she is laying down but has incisional discomfort when she starts moving around.  This pain does resolve when she lays down or sits.  No fever or chills reported. No incisional drainage. She started the new antibiotics, doxy and flagyl last pm.   Discussed the option of emergency room evaluation to begin IV antibiotics along with CT imaging.  The patient states she has CT imaging ordered and scheduled for this afternoon at 5 PM as an outpatient.  She would like to stick with this plan.  Advised patient to stay at [facility] long radiology until her scan has been read and she has been contacted by Dr. Tamela Oddi with recommendations for next steps.  Advised patient this may include seeking care in the emergency room to begin IV antibiotics with potential admission.  Patient is aware. She has been taking one tramadol a day as needed and is almost finished with supply. Refill has been sent in. Advised patient to call for any needs.

## 2023-01-01 NOTE — H&P (Signed)
Rachel Carlson is an 77 y.o. female.   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  On 12/24/2022 she had a routine post-operative follow up and staple removal. At that time, her abdominal incision was erythematous with concerns for early incisional cellulitis. She was started on keflex at that time. Labs were obtained with WBC count at 6.3, Hgb 8.6, Hct 26.5. The incision remained intact after staple removal and there was no drainage to obtain for culture. Follow up was arranged one week later for follow up. At the follow-up visit on the day of admission the exam in the office was remarkable for worsening cellulitis.  The also complained of loss of appetite, fatigue.  She denies any fever or chills.     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)    Esophageal stricture    Family history of breast cancer    Family history of colon cancer    Family history of melanoma    Family history of prostate cancer    Fibroids 1998   uterine, history of (left ovaries)   GERD (gastroesophageal reflux disease)    History of hiatal hernia    History of uterine prolapse    Hx of adenomatous polyp of colon 10/19/2014   Hyperlipidemia    Hypertension    borderline no meds diet and exercise   Neuromuscular disorder (HCC)    neuropathy from chemo   Obesity    Ovarian cancer (HCC)    PVC (premature ventricular contraction)    first dx by holter in 1980's, echo (6/10) EF 55-60%, normal diastolic fxn, normal size RV and fxn, mild MR, PASP   Squamous cell carcinoma of arm    ovarian   Tubal pregnancy    Ulcerative proctosigmoiditis (HCC)    Urge urinary incontinence 06/03/2007   Qualifier: Diagnosis of  By: Lindwood Qua CMA, Jerl Santos      Past Surgical History:  Procedure Laterality Date   BILATERAL SALPINGECTOMY Bilateral 12/12/2022   Procedure: OPEN BILATERAL SALPINGECTOMY-OOPHORECTOMY;  Surgeon: Carver Fila, MD;  Location: [facility];  Service: Gynecology;  Laterality: Bilateral;   BLADDER NECK SUSPENSION  1998   BREAST BIOPSY Left    BREAST LUMPECTOMY Left    benign   CATARACT EXTRACTION Right 01/2020   CATARACT EXTRACTION W/PHACO Left 08/12/2020   Procedure: CATARACT EXTRACTION PHACO AND INTRAOCULAR LENS PLACEMENT LEFT EYE;  Surgeon: Fabio Pierce, MD;  Location: [facility];  Service: Ophthalmology;  Laterality: Left;  left CDE=10.71   COLONOSCOPY  05/18/2011/10/12/14   Dr. Stan Head   DEBULKING N/A 12/12/2022   Procedure: RADICAL TUMOR DEBULKING, PERITONEAL STRIPPING, MOBILIZATION SPLENIC FLEXURE, RECTOSIGMOID RESECTION,SMALL BOWEL RESECTION AND REANASTOMOSIS ,END TO END DESCENDING COLOSTOMY;  Surgeon: Carver Fila, MD;  Location: [facility];  Service: Gynecology;  Laterality: N/A;   ECTOPIC PREGNANCY SURGERY  1980   ESOPHAGOGASTRODUODENOSCOPY (EGD) WITH ESOPHAGEAL DILATION  04/03/2012   Dr. Stan Head   IR IMAGING GUIDED PORT INSERTION  09/06/2022   OMENTECTOMY N/A 12/12/2022   Procedure: OMENTECTOMY;  Surgeon: Carver Fila, MD;  Location: [facility];  Service: Gynecology;  Laterality: N/A;   PARTIAL HYSTERECTOMY     abdominal, fibroids, and prolapse (1998) bladder tack   SALPINGOOPHORECTOMY Bilateral  12/12/2022   Procedure: DIAGNOSTIC LAPAROSCOPY;  Surgeon: Carver Fila, MD;  Location: [facility];  Service: Gynecology;  Laterality: Bilateral;   SKIN CANCER EXCISION     UPPER GASTROINTESTINAL ENDOSCOPY      Family History  Problem Relation Age of Onset   Sudden death Father 23       "coronary arteriosclerosis" on death certificate   Heart attack Mother 21   Transient ischemic attack Mother    Diabetes type II Mother    Sudden death Mother        died age 28   Diabetes Mother     Coronary artery disease Mother    Skin cancer Mother    Barrett's esophagus Mother    Arrhythmia Brother    Colon cancer Brother 66   Breast cancer Sister        dx in her 59s   Colon polyps Sister        adenomatous   Colon cancer Son 56       lynch syndrome   Melanoma Maternal Uncle    Congestive Heart Failure Maternal Grandmother    Prostate cancer Maternal Grandfather        dx in 69s   Tuberculosis Paternal Grandfather    Skin cancer Maternal Aunt    Barrett's esophagus Maternal Aunt    Other Son        parotid gland tumor   Ehlers-Danlos syndrome Daughter    Colon cancer Cousin        mid 71s; maternal cousin   Colon cancer Other        MGMs brother   Esophageal cancer Neg Hx    Stomach cancer Neg Hx    Rectal cancer Neg Hx    Social History:  reports that she has never smoked. She has never used smokeless tobacco. She reports that she does not drink alcohol and does not use drugs.  Allergies:  Allergies  Allergen Reactions   Aspartame     Intolerance to artifical sweeteners, pt states caused burning of bladder   Pantoprazole Sodium Diarrhea    Abdominal pain   Ranitidine Diarrhea    abdominal pain   Omeprazole Diarrhea    Abdominal pain   Sulfasalazine Rash and Other (See Comments)    Fever, chills, headache, muscle pain also    Facility-Administered Medications Prior to Admission  Medication Dose Route Frequency Provider Last Rate Last Admin   botulinum toxin Type A (BOTOX) injection 200 Units  200 Units Intramuscular Once        Medications Prior to Admission  Medication Sig Dispense Refill   apixaban (ELIQUIS) 5 MG TABS tablet Take 1 tablet (5 mg total) by mouth 2 (two) times daily. 180 tablet 0   CARBOPLATIN IV Inject into the vein every 21 ( twenty-one) days. port     doxycycline (VIBRA-TABS) 100 MG tablet Take 1 tablet (100 mg total) by mouth 2 (two) times daily. 20 tablet 0   ketoconazole (NIZORAL) 2 % cream Apply 1 Application topically daily. To  yeast rash (Patient taking differently: Apply 1 Application topically daily as needed for irritation.) 30 g 1   lidocaine-prilocaine (EMLA) cream Apply to affected area once (Patient taking differently: Apply 1 Application topically daily as needed (prior to port access).) 30 g 3   metroNIDAZOLE (FLAGYL) 500 MG tablet Take 1 tablet (500 mg total) by mouth 3 (three) times daily. 30 tablet 0   ondansetron (ZOFRAN) 8 MG tablet Take 1 tablet (8 mg  total) by mouth every 8 (eight) hours as needed for nausea or vomiting. Start on the third day after carboplatin. 30 tablet 1   PACLITAXEL IV Inject into the vein every 21 ( twenty-one) days. port     Polyethyl Glycol-Propyl Glycol (SYSTANE OP) Place 1 drop into both eyes 4 (four) times daily as needed (for dryness).     PREVACID 24HR 15 MG capsule Take 15 mg by mouth See admin instructions. Take 15 mg by mouth in the morning before any food and an additional 15 mg once a day as needed for unresolved reflux     prochlorperazine (COMPAZINE) 10 MG tablet Take 10 mg by mouth every 6 (six) hours as needed for nausea or vomiting.     rosuvastatin (CRESTOR) 20 MG tablet Take 1 tablet (20 mg total) by mouth at bedtime. 90 tablet 3   traMADol (ULTRAM) 50 MG tablet Take 1 tablet (50 mg total) by mouth every 6 (six) hours as needed for severe pain (pain score 7-10). For AFTER surgery only, do not take and drive 15 tablet 0   traZODone (DESYREL) 50 MG tablet Take 1 tablet (50 mg total) by mouth at bedtime as needed for sleep. 30 tablet 1   Ascorbic Acid (VITAMIN C) 1000 MG tablet Take 1,000 mg by mouth daily. (Patient not taking: Reported on 01/01/2023)     calcium citrate-vitamin D (CITRACAL+D) 315-200 MG-UNIT per tablet Take 1 tablet by mouth 2 (two) times daily. (Patient not taking: Reported on 01/01/2023)     Cholecalciferol (VITAMIN D-3) 5000 UNITS TABS Take 5,000 Units by mouth daily. (Patient not taking: Reported on 01/01/2023)     Coenzyme Q10 (CO Q-10) 200 MG CAPS  Take 200 mg by mouth daily. (Patient not taking: Reported on 01/01/2023)     CRANBERRY PO Take 650 mg by mouth daily. (Patient not taking: Reported on 01/01/2023)     Glucosamine-Chondroitin (GLUCOSAMINE CHONDR COMPLEX PO) Take 2 capsules by mouth daily.  (Patient not taking: Reported on 01/01/2023)     GRAPE SEED EXTRACT PO Take 1 capsule by mouth daily. (Patient not taking: Reported on 01/01/2023)     lansoprazole (PREVACID) 15 MG capsule Take 1 capsule (15 mg total) by mouth 2 (two) times daily before a meal. (Patient not taking: Reported on 01/01/2023)     milk thistle 175 MG tablet Take 175 mg by mouth daily. (Patient not taking: Reported on 01/01/2023)     Omega-3 Fatty Acids (FISH OIL TRIPLE STRENGTH PO) Take 1 capsule by mouth daily. (Patient not taking: Reported on 01/01/2023)     phenazopyridine (PYRIDIUM) 200 MG tablet Take 1 tablet (200 mg total) by mouth 3 (three) times daily as needed for pain. (Patient not taking: Reported on 01/01/2023) 10 tablet 0   vitamin B-12 (CYANOCOBALAMIN) 1000 MCG tablet Take 1,000 mcg by mouth daily. (Patient not taking: Reported on 01/01/2023)     zinc gluconate 50 MG tablet Take 50 mg by mouth daily. (Patient not taking: Reported on 01/01/2023)      Results for orders placed or performed in visit on 12/31/22 (from the past 48 hour(s))  Basic metabolic panel     Status: Abnormal   Collection Time: 12/31/22  3:51 PM  Result Value Ref Range   Sodium 138 135 - 145 mmol/L   Potassium 3.9 3.5 - 5.1 mmol/L   Chloride 104 98 - 111 mmol/L   CO2 27 22 - 32 mmol/L   Glucose, Bld 100 (H) 70 - 99 mg/dL  Comment: Glucose reference range applies only to samples taken after fasting for at least 8 hours.   BUN 19 8 - 23 mg/dL   Creatinine, Ser 1.61 0.44 - 1.00 mg/dL   Calcium 9.5 8.9 - 09.6 mg/dL   GFR, Estimated >04 >54 mL/min    Comment: (NOTE) Calculated using the CKD-EPI Creatinine Equation (2021)    Anion gap 7 5 - 15    Comment: Performed at [facility], [address], [place], [place] [address]  CBC with Differential (Cancer Center Only)     Status: Abnormal   Collection Time: 12/31/22  3:51 PM  Result Value Ref Range   WBC Count 5.6 4.0 - 10.5 K/uL   RBC 3.15 (L) 3.87 - 5.11 MIL/uL   Hemoglobin 9.8 (L) 12.0 - 15.0 g/dL   HCT 91.4 (L) 78.2 - 95.6 %   MCV 95.6 80.0 - 100.0 fL   MCH 31.1 26.0 - 34.0 pg   MCHC 32.6 30.0 - 36.0 g/dL   RDW 21.3 (H) 08.6 - 57.8 %   Platelet Count 361 150 - 400 K/uL   nRBC 0.0 0.0 - 0.2 %   Neutrophils Relative % 71 %   Neutro Abs 4.0 1.7 - 7.7 K/uL   Lymphocytes Relative 16 %   Lymphs Abs 0.9 0.7 - 4.0 K/uL   Monocytes Relative 11 %   Monocytes Absolute 0.6 0.1 - 1.0 K/uL   Eosinophils Relative 1 %   Eosinophils Absolute 0.1 0.0 - 0.5 K/uL   Basophils Relative 1 %   Basophils Absolute 0.0 0.0 - 0.1 K/uL   Immature Granulocytes 0 %   Abs Immature Granulocytes 0.01 0.00 - 0.07 K/uL    Comment: Performed at [facility], [address], [place], [place] [address]   CT ABDOMEN PELVIS W CONTRAST  Result Date: 01/01/2023 CLINICAL DATA:  Postoperative abdominal pain with worsening cellulitis surround the abdominal incision site. History of recent surgery for ovarian cancer and peritoneal carcinomatosis. EXAM: CT ABDOMEN AND PELVIS WITH CONTRAST TECHNIQUE: Multidetector CT imaging of the abdomen and pelvis was performed using the standard protocol following bolus administration of intravenous contrast. RADIATION DOSE REDUCTION: This exam was performed according to the departmental dose-optimization program which includes automated exposure control, adjustment of the mA and/or kV according to patient size and/or use of iterative reconstruction technique. CONTRAST:  OMNIPAQUE IOHEXOL 300 MG/ML  SOLN COMPARISON:  CT scan 11/07/2022 FINDINGS: Lower chest: Persistent moderate-sized left pleural effusion and small right pleural effusion with overlying atelectasis. Stable  large hiatal hernia. No pulmonary nodules or enhancing pleural nodules. Hepatobiliary: No hepatic lesions or intrahepatic biliary dilatation. No obvious peritoneal surface disease. The gallbladder is moderately contracted. No common bile duct dilatation. Pancreas: No mass, inflammation or ductal dilatation. Spleen: Normal size. No focal lesions or peritoneal surface disease. Adrenals/Urinary Tract: The adrenal glands and kidneys are unremarkable. Bladder is grossly normal. Stomach/Bowel: Extensive surgical changes with small bowel anastomosis in the pelvis and a Hartmann's pouch and left sided colostomy. The stomach, duodenum, small bowel and colon are grossly normal. No acute inflammatory process or obstructive findings. Vascular/Lymphatic: Atherosclerotic calcifications involving the aorta and branch vessels but no aneurysm or dissection. The major venous structures are patent. Small scattered mesenteric and retroperitoneal lymph nodes but no mass or overt adenopathy. I do not see any obvious residual omental disease or obvious peritoneal implants. Reproductive: Surgically absent. Other: Mild mesenteric interstitial changes and a small amount of free pelvic fluid.  There is a very thin slightly elongated rim enhancing fluid collection in the anterior abdomen adjacent to the liver on image number 30/2 measuring a maximum of 4 cm. This could be a small intra-abdominal abscess. There is a tiny focus of free air nearby laterally and also medially. There is a superficial abscess noted in the subcutaneous fat with shaggy rim like enhancement and some associated gas. This measures approximately 5.1 x 2.3 x 3.4 cm and is just below the umbilicus. Associated surrounding skin thickening and cellulitis. Musculoskeletal: No significant bony findings. IMPRESSION: 1. 5.1 x 2.3 x 3.4 cm superficial abscess in the subcutaneous fat just below the umbilicus with surrounding skin thickening and cellulitis. 2. 4 cm thin elongated  intra-abdominal abscess anteriorly near the liver. This appears to be too small to drain percutaneously. 3. Extensive surgical changes with small bowel anastomosis in the pelvis and a left-sided colostomy. No acute bowel findings. 4. Persistent moderate-sized left pleural effusion and small right pleural effusion with overlying atelectasis. 5. Stable hiatal hernia. 6. No obvious residual omental disease or peritoneal implants. Aortic Atherosclerosis (ICD10-I70.0). Electronically Signed   By: Rudie Meyer M.D.   On: 01/01/2023 18:33    Review of Systems  Constitutional:  Positive for appetite change and fatigue. Negative for chills and fever.  Gastrointestinal:  Positive for nausea. Negative for vomiting.    Blood pressure (!) 144/80, pulse 83, temperature 98 F (36.7 C), temperature source Oral, resp. rate 14, last menstrual period 05/29/1996, SpO2 95%. Physical Exam  Exam in th office: by Warner Mccreedy NP:  Alert, oriented, in no acute distress.  Lungs clear. Heart regular in rate and rhythm. Abdomen is soft, non-tender, non-distended. Ostomy appliance is intact with flatus and loose brown stool in bag. Stoma is pink/red. Midline abdominal incision with increased erythema, increased warmth, firmness at the lower aspect of the incision. Erythema has extended past the previously marked borders. Erythema at the upper aspect of the incision has decreased. No lower extrem edema noted. No drainage noted from incision. 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 Imaging also suggestive of an intra-abdominal infectious process--not felt to be amenable to image-guided drainage No alarm signs/sxs;for a SIRS picture.. -Admit -Broad spectrum abx to cover MRSA -I&D of wound -Monitor closely for clinical  improvement  Antionette Char, MD 01/01/2023, 11:36 PM

## 2023-01-01 NOTE — Telephone Encounter (Signed)
Called patient and discussed Bmet results. Discussed recommendation from Dr. Alvester Morin for doxycycline and flagyl for treatment of incisional cellulitis. Clindamycin has been cancelled with [facility]. Advised to continue with plan for CT scan tomorrow. Advised to seek care in the ER for worsening symptoms. Patient verbalizes understanding. Advised to call for any needs.

## 2023-01-01 NOTE — Progress Notes (Signed)
Pharmacy Antibiotic Note  Rachel Carlson is a 77 y.o. female admitted on 01/01/2023 s/p exploratory laparotomy with radical tumor debulking including total omentectomy, resection of peritoneal disease, small bowel resection with 3 anastomoses, en bloc resection of the left fallopian tube and ovary as well as rectosigmoid colon, left ureterolysis, right salpingo-oophorectomy, end descending colostomy, cystoscopy on 11/13 and discharged 11/22. At follow up appointment 12/2, concern for cellulitis at incision site and patient started on doxycycline and metronidazole. On 12/3 patient reports feeling poorly. CT abdomen/pelvis with superficial abscess in the subcutaneous fat just below the umbilicus and elongated intra-abdominal abscess anteriorly near the liver. Pharmacy has been consulted for vancomycin and Zosyn dosing.  Plan: -Vancomycin 1000 mg IV q24h -Zosyn 3.375 g IV q8h extended infusion -Continue to follow renal function, cultures and clinical progress for dose adjustments and de-escalation as indicated     Temp (24hrs), Avg:98.6 F (37 C), Min:98.6 F (37 C), Max:98.6 F (37 C)  Recent Labs  Lab 12/31/22 1551  WBC 5.6  CREATININE 0.52    Estimated Creatinine Clearance: 56.7 mL/min (by C-G formula based on SCr of 0.52 mg/dL).    Allergies  Allergen Reactions   Aspartame     Intolerance to artifical sweeteners, pt states caused burning of bladder   Pantoprazole Sodium Diarrhea    Abdominal pain   Ranitidine Diarrhea    abdominal pain   Omeprazole Diarrhea    Abdominal pain   Sulfasalazine Rash and Other (See Comments)    Fever, chills, headache, muscle pain also    Antimicrobials this admission: Vancomycin 12/3 >> Zosyn 12/3 >>  Dose adjustments this admission: NA  Microbiology results: NA  Thank you for allowing pharmacy to be a part of this patient's care.  Pricilla Riffle, PharmD, BCPS Clinical Pharmacist 01/01/2023 8:58 PM

## 2023-01-02 ENCOUNTER — Encounter: Payer: Self-pay | Admitting: Gynecologic Oncology

## 2023-01-02 ENCOUNTER — Inpatient Hospital Stay (HOSPITAL_COMMUNITY): Payer: Medicare Other | Admitting: Anesthesiology

## 2023-01-02 ENCOUNTER — Telehealth: Payer: Self-pay | Admitting: Oncology

## 2023-01-02 ENCOUNTER — Other Ambulatory Visit: Payer: Self-pay

## 2023-01-02 ENCOUNTER — Encounter (HOSPITAL_COMMUNITY)
Admission: AD | Disposition: A | Discharge status: Home Health | Payer: Self-pay | Source: Other Acute Inpatient Hospital | Attending: Gynecologic Oncology

## 2023-01-02 ENCOUNTER — Encounter (HOSPITAL_COMMUNITY): Payer: Self-pay | Admitting: Obstetrics & Gynecology

## 2023-01-02 DIAGNOSIS — T8142XA Infection following a procedure, deep incisional surgical site, initial encounter: Secondary | ICD-10-CM | POA: Diagnosis not present

## 2023-01-02 DIAGNOSIS — Z9889 Other specified postprocedural states: Secondary | ICD-10-CM | POA: Diagnosis not present

## 2023-01-02 DIAGNOSIS — C579 Malignant neoplasm of female genital organ, unspecified: Secondary | ICD-10-CM | POA: Diagnosis not present

## 2023-01-02 DIAGNOSIS — T8149XA Infection following a procedure, other surgical site, initial encounter: Secondary | ICD-10-CM | POA: Diagnosis present

## 2023-01-02 DIAGNOSIS — L02211 Cutaneous abscess of abdominal wall: Secondary | ICD-10-CM | POA: Diagnosis not present

## 2023-01-02 HISTORY — PX: INCISION AND DRAINAGE ABSCESS: SHX5864

## 2023-01-02 LAB — CBC WITH DIFFERENTIAL/PLATELET
Abs Immature Granulocytes: 0.01 10*3/uL (ref 0.00–0.07)
Basophils Absolute: 0 10*3/uL (ref 0.0–0.1)
Basophils Relative: 1 %
Eosinophils Absolute: 0.1 10*3/uL (ref 0.0–0.5)
Eosinophils Relative: 2 %
HCT: 28.6 % — ABNORMAL LOW (ref 36.0–46.0)
Hemoglobin: 9.1 g/dL — ABNORMAL LOW (ref 12.0–15.0)
Immature Granulocytes: 0 %
Lymphocytes Relative: 22 %
Lymphs Abs: 0.9 10*3/uL (ref 0.7–4.0)
MCH: 31.3 pg (ref 26.0–34.0)
MCHC: 31.8 g/dL (ref 30.0–36.0)
MCV: 98.3 fL (ref 80.0–100.0)
Monocytes Absolute: 0.4 10*3/uL (ref 0.1–1.0)
Monocytes Relative: 11 %
Neutro Abs: 2.5 10*3/uL (ref 1.7–7.7)
Neutrophils Relative %: 64 %
Platelets: 300 10*3/uL (ref 150–400)
RBC: 2.91 MIL/uL — ABNORMAL LOW (ref 3.87–5.11)
RDW: 17.6 % — ABNORMAL HIGH (ref 11.5–15.5)
WBC: 3.9 10*3/uL — ABNORMAL LOW (ref 4.0–10.5)
nRBC: 0 % (ref 0.0–0.2)

## 2023-01-02 LAB — CREATININE, SERUM
Creatinine, Ser: 0.44 mg/dL (ref 0.44–1.00)
GFR, Estimated: >60 mL/min (ref >60)

## 2023-01-02 SURGERY — INCISION AND DRAINAGE, ABSCESS
Anesthesia: General

## 2023-01-02 MED ORDER — ONDANSETRON HCL 4 MG/2ML IJ SOLN
INTRAMUSCULAR | Status: AC
  Filled 2023-01-02: qty 2

## 2023-01-02 MED ORDER — PHENYLEPHRINE 80 MCG/ML (10ML) SYRINGE FOR IV PUSH (FOR BLOOD PRESSURE SUPPORT)
PREFILLED_SYRINGE | INTRAVENOUS | Status: AC
  Filled 2023-01-02: qty 10

## 2023-01-02 MED ORDER — 0.9 % SODIUM CHLORIDE (POUR BTL) OPTIME
TOPICAL | Status: DC | PRN
  Administered 2023-01-02: 1000 mL

## 2023-01-02 MED ORDER — DEXAMETHASONE SODIUM PHOSPHATE 10 MG/ML IJ SOLN
INTRAMUSCULAR | Status: AC
Start: 2023-01-02 — End: ?
  Filled 2023-01-02: qty 1

## 2023-01-02 MED ORDER — ROCURONIUM BROMIDE 10 MG/ML (PF) SYRINGE
PREFILLED_SYRINGE | INTRAVENOUS | Status: AC
  Filled 2023-01-02: qty 10

## 2023-01-02 MED ORDER — LACTATED RINGERS IV SOLN: INTRAVENOUS | Status: DC | PRN

## 2023-01-02 MED ORDER — KCL IN DEXTROSE-NACL 20-5-0.45 MEQ/L-%-% IV SOLN
INTRAVENOUS | Status: DC
  Filled 2023-01-02: qty 1000

## 2023-01-02 MED ORDER — SODIUM CHLORIDE 0.9% FLUSH
3.0000 mL | Freq: Two times a day (BID) | INTRAVENOUS | Status: AC
  Administered 2023-01-02: 3 mL via INTRAVENOUS

## 2023-01-02 MED ORDER — DEXAMETHASONE SODIUM PHOSPHATE 10 MG/ML IJ SOLN
INTRAMUSCULAR | Status: DC | PRN
  Administered 2023-01-02: 10 mg via INTRAVENOUS

## 2023-01-02 MED ORDER — PROPOFOL 10 MG/ML IV BOLUS
INTRAVENOUS | Status: AC
Start: 2023-01-02 — End: ?
  Filled 2023-01-02: qty 20

## 2023-01-02 MED ORDER — FENTANYL CITRATE (PF) 100 MCG/2ML IJ SOLN
INTRAMUSCULAR | Status: AC
  Filled 2023-01-02: qty 2

## 2023-01-02 MED ORDER — SODIUM CHLORIDE 0.9% FLUSH
10.0000 mL | Freq: Two times a day (BID) | INTRAVENOUS | Status: DC
  Administered 2023-01-04 – 2023-01-08 (×4): 10 mL via INTRAVENOUS

## 2023-01-02 MED ORDER — ROCURONIUM BROMIDE 10 MG/ML (PF) SYRINGE
PREFILLED_SYRINGE | INTRAVENOUS | Status: DC | PRN
  Administered 2023-01-02: 50 mg via INTRAVENOUS

## 2023-01-02 MED ORDER — BUPIVACAINE HCL 0.25 % IJ SOLN
INTRAMUSCULAR | Status: AC
  Filled 2023-01-02: qty 1

## 2023-01-02 MED ORDER — ACETAMINOPHEN 10 MG/ML IV SOLN: 1000.0000 mg | Freq: Once | INTRAVENOUS | Status: DC | PRN

## 2023-01-02 MED ORDER — FENTANYL CITRATE PF 50 MCG/ML IJ SOSY
PREFILLED_SYRINGE | INTRAMUSCULAR | Status: AC
  Filled 2023-01-02: qty 1

## 2023-01-02 MED ORDER — FENTANYL CITRATE PF 50 MCG/ML IJ SOSY
25.0000 ug | PREFILLED_SYRINGE | INTRAMUSCULAR | Status: DC | PRN
  Administered 2023-01-02 (×2): 50 ug via INTRAVENOUS

## 2023-01-02 MED ORDER — SUGAMMADEX SODIUM 200 MG/2ML IV SOLN
INTRAVENOUS | Status: DC | PRN
  Administered 2023-01-02: 150 mg via INTRAVENOUS

## 2023-01-02 MED ORDER — PHENYLEPHRINE 80 MCG/ML (10ML) SYRINGE FOR IV PUSH (FOR BLOOD PRESSURE SUPPORT)
PREFILLED_SYRINGE | INTRAVENOUS | Status: DC | PRN
  Administered 2023-01-02 (×4): 160 ug via INTRAVENOUS

## 2023-01-02 MED ORDER — PROPOFOL 10 MG/ML IV BOLUS
INTRAVENOUS | Status: DC | PRN
  Administered 2023-01-02: 140 mg via INTRAVENOUS

## 2023-01-02 MED ORDER — SODIUM CHLORIDE 0.9% FLUSH: 3.0000 mL | Freq: Two times a day (BID) | INTRAVENOUS | Status: DC

## 2023-01-02 MED ORDER — LIDOCAINE HCL (PF) 2 % IJ SOLN
INTRAMUSCULAR | Status: AC
Start: 2023-01-02 — End: ?
  Filled 2023-01-02: qty 5

## 2023-01-02 MED ORDER — FENTANYL CITRATE (PF) 100 MCG/2ML IJ SOLN
INTRAMUSCULAR | Status: DC | PRN
  Administered 2023-01-02 (×2): 50 ug via INTRAVENOUS

## 2023-01-02 MED ORDER — BUPIVACAINE-EPINEPHRINE 0.25% -1:200000 IJ SOLN
INTRAMUSCULAR | Status: DC | PRN
  Administered 2023-01-02: 30 mL

## 2023-01-02 MED ORDER — APIXABAN 5 MG PO TABS
5.0000 mg | ORAL_TABLET | Freq: Two times a day (BID) | ORAL | Status: DC
  Administered 2023-01-03 – 2023-01-09 (×13): 5 mg via ORAL
  Filled 2023-01-02 (×13): qty 1

## 2023-01-02 MED ORDER — LIDOCAINE 2% (20 MG/ML) 5 ML SYRINGE
INTRAMUSCULAR | Status: DC | PRN
  Administered 2023-01-02: 60 mg via INTRAVENOUS

## 2023-01-02 MED ORDER — ONDANSETRON HCL 4 MG/2ML IJ SOLN
INTRAMUSCULAR | Status: DC | PRN
  Administered 2023-01-02: 4 mg via INTRAVENOUS

## 2023-01-02 MED ORDER — FENTANYL CITRATE PF 50 MCG/ML IJ SOSY
PREFILLED_SYRINGE | INTRAMUSCULAR | Status: AC
  Administered 2023-01-02: 50 ug via INTRAVENOUS
  Filled 2023-01-02: qty 2

## 2023-01-02 SURGICAL SUPPLY — 29 items
BAG COUNTER SPONGE SURGICOUNT (BAG) IMPLANT
BLADE SURG 15 STRL LF DISP TIS (BLADE) ×2 IMPLANT
BNDG GAUZE DERMACEA FLUFF 4 (GAUZE/BANDAGES/DRESSINGS) IMPLANT
DRAPE LAPAROSCOPIC ABDOMINAL (DRAPES) IMPLANT
DRSG TEGADERM 4X4.75 (GAUZE/BANDAGES/DRESSINGS) IMPLANT
DRSG VAC GRANUFOAM SM (GAUZE/BANDAGES/DRESSINGS) IMPLANT
ELECT REM PT RETURN 15FT ADLT (MISCELLANEOUS) ×2 IMPLANT
GAUZE PAD ABD 8X10 STRL (GAUZE/BANDAGES/DRESSINGS) IMPLANT
GAUZE SPONGE 4X4 12PLY STRL (GAUZE/BANDAGES/DRESSINGS) IMPLANT
GLOVE BIO SURGEON STRL SZ7.5 (GLOVE) ×2 IMPLANT
GOWN STRL REUS W/ TWL LRG LVL3 (GOWN DISPOSABLE) ×4 IMPLANT
KIT BASIN OR (CUSTOM PROCEDURE TRAY) ×2 IMPLANT
KIT TURNOVER KIT A (KITS) IMPLANT
NDL HYPO 22X1.5 SAFETY MO (MISCELLANEOUS) IMPLANT
NDL HYPO 25X1 1.5 SAFETY (NEEDLE) IMPLANT
NEEDLE HYPO 22X1.5 SAFETY MO (MISCELLANEOUS) ×1 IMPLANT
NEEDLE HYPO 25X1 1.5 SAFETY (NEEDLE) IMPLANT
NS IRRIG 1000ML POUR BTL (IV SOLUTION) ×2 IMPLANT
PENCIL SMOKE EVACUATOR (MISCELLANEOUS) IMPLANT
SPIKE FLUID TRANSFER (MISCELLANEOUS) IMPLANT
SUT MNCRL AB 4-0 PS2 18 (SUTURE) IMPLANT
SUT VIC AB 3-0 SH 27XBRD (SUTURE) IMPLANT
SWAB COLLECTION DEVICE MRSA (MISCELLANEOUS) IMPLANT
SWAB CULTURE ESWAB REG 1ML (MISCELLANEOUS) IMPLANT
SYR 10ML LL (SYRINGE) IMPLANT
SYR BULB EAR ULCER 3OZ GRN STR (SYRINGE) ×2 IMPLANT
SYR CONTROL 10ML LL (SYRINGE) IMPLANT
TOWEL OR 17X26 10 PK STRL BLUE (TOWEL DISPOSABLE) ×2 IMPLANT
YANKAUER SUCT BULB TIP NO VENT (SUCTIONS) ×2 IMPLANT

## 2023-01-02 NOTE — Anesthesia Procedure Notes (Signed)
Procedure Name: Intubation Date/Time: 01/02/2023 12:22 PM  Performed by: [name], CRNAPre-anesthesia Checklist: Patient identified, Emergency Drugs available, Suction available and Patient being monitored Patient Re-evaluated:Patient Re-evaluated prior to induction Oxygen Delivery Method: Circle system utilized Preoxygenation: Pre-oxygenation with 100% oxygen Induction Type: IV induction Ventilation: Mask ventilation without difficulty and Oral airway inserted - appropriate to patient size Laryngoscope Size: 2 and Miller Tube type: Oral Tube size: 7.0 mm Number of attempts: 1 Airway Equipment and Method: Stylet and Oral airway Placement Confirmation: ETT inserted through vocal cords under direct vision, positive ETCO2 and breath sounds checked- equal and bilateral Secured at: 20 cm Tube secured with: Tape Dental Injury: Teeth and Oropharynx as per pre-operative assessment

## 2023-01-02 NOTE — Interval H&P Note (Signed)
History and Physical Interval Note:  01/02/2023 11:31 AM  Rachel Carlson  has presented today for surgery, with the diagnosis of SUPERFICIAL ABDOMIAL ABSCESS.  The various methods of treatment have been discussed with the patient and family. After consideration of risks, benefits and other options for treatment, the patient has consented to  Procedure(s): INCISION AND DRAINAGE OF ABDOMINAL ABSCESS (N/A) as a surgical intervention.  The patient's history has been reviewed, patient examined, no change in status, stable for surgery.  I have reviewed the patient's chart and labs.  Questions were answered to the patient's satisfaction.     Antionette Char

## 2023-01-02 NOTE — Anesthesia Postprocedure Evaluation (Signed)
Anesthesia Post Note  Patient: Rachel Carlson  Procedure(s) Performed: INCISION AND DRAINAGE OF ABDOMINAL WOUND SEROMA     Anesthesia Type: General Anesthetic complications: no   No notable events documented.  Last Vitals:  Vitals:   01/02/23 0915 01/02/23 1138  BP: 130/65 137/77  Pulse: 87 84  Resp:  18  Temp: 36.9 C 37.3 C  SpO2: 95% 95%    Last Pain:  Vitals:   01/02/23 1138  TempSrc: Oral  PainSc:                  [name]

## 2023-01-02 NOTE — Progress Notes (Signed)
GYN Oncology Progress Note  Subjective: Patient reports feeling better when laying down. Was previously experiencing more pain with movement in the abdominal incision. Denies symptoms of fevers. Has been NPO. Ostomy with output and flatus. Patient currently has home health coming to her home. She states they noted the increase in incisional erythema with no interventions/recommendations given. No concerns voiced.  Objective: Vital signs in last 24 hours: Temp:  [98 F (36.7 C)-98.6 F (37 C)] 98.3 F (36.8 C) (12/04 0715) Pulse Rate:  [83-91] 87 (12/04 0715) Resp:  [14-18] 14 (12/04 0715) BP: (130-150)/(76-82) 139/76 (12/04 0715) SpO2:  [95 %-96 %] 96 % (12/04 0528) Weight:  [158 lb 15.2 oz (72.1 kg)] 158 lb 15.2 oz (72.1 kg) (12/04 0715)    Intake/Output from previous day: 12/03 0701 - 12/04 0700 In: 380.3 [P.O.:330; IV Piggyback:50.3] Out: 600 [Urine:600]  Physical Examination (performed by Dr.Tucker): Alert, oriented, in no acute distress.  Lungs clear. Heart regular in rate and rhythm. Abdomen is soft, non-tender, non-distended. Ostomy appliance is intact with flatus and loose brown stool in bag. Stoma is pink/red. Midline abdominal incision under the umbilicus with decrease in marked erythema, firmness/fluctuance noted in this area. Erythema at the upper aspect of the incision has decreased. No lower extrem edema noted. No drainage noted from incision.   Labs: WBC/Hgb/Hct/Plts:  3.9/9.1/28.6/300 (12/04 0240) BUN/Cr/glu/ALT/AST/amyl/lip:  --/0.44/--/--/--/--/-- (12/04 0240)   Assessment: 77 y.o. 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 On 12/12/2022, she underwent diagnostic laparoscopy, conversion to exploratory laparotomy with radical tumor debulking including total omentectomy, resection of peritoneal disease, small bowel resection with 3 anastomosis, en bloc resection of the left fallopian tube and ovary as well as rectosigmoid  colon, left ureterolysis, right salpingo-oophorectomy, end descending colostomy, cystoscopy with Dr. Eugene Garnet. Her hospital admission was from 12/12/2022 to 12/21/2022. When presenting to the office for staple removal, mild incisional erythema concerning for early cellulitis was noted and she was started on keflex. She presented to the office for follow up and incision assessment on 12/31/2022 with worsening cellulitis of the lower aspect of the incision. A CT AP was ordered to further evaluate and antibiotics were changed to doxycycline and flagyl.   Her CT scan was performed on 01/01/2023 resulting:   1. 5.1 x 2.3 x 3.4 cm superficial abscess in the subcutaneous fat just below the umbilicus with surrounding skin thickening and cellulitis. 2. 4 cm thin elongated intra-abdominal abscess anteriorly near the liver. This appears to be too small to drain percutaneously. 3. Extensive surgical changes with small bowel anastomosis in the pelvis and a left-sided colostomy. No acute bowel findings. 4. Persistent moderate-sized left pleural effusion and small right pleural effusion with overlying atelectasis. 5. Stable hiatal hernia. 6. No obvious residual omental disease or peritoneal implants. 7. Aortic Atherosclerosis  After scan results, the patient was admitted to the hospital for IV antibiotics and possible incision and drainage. After discussion with patient and Dr. Pricilla Holm this am, plan will be for incision and drainage of abdominal incision fluid collection, suspected abscess. There may be consideration of wound VAC placement based on the outcome and findings of surgical procedure.    Pain:  Pain is well-controlled on PCA or oral medications.  Heme: Hgb 9.1 and Hct 28.6 early this am. Overall stable compared with previous values.   ID: WBC 3.9, previously 5.6. Admitted with incisional abscess and intra-abdominal abscess. Currently on zosyn and vancomycin. Plan for incision and drainage today in  the OR.  CV: BP and HR are stable. Continue to monitor with vital signs while inpt.  GI:  Tolerating po: currently NPO. Decreased appetite prior to admission. Has ostomy s/p bowel surgery listed above.  FEN: No critical values on 12/31/22 labs.   Prophylaxis: SCDs ordered. Lovenox/Eliquis on hold given procedure today   Plan: Plan for I&D of abdominal incision collection today Continue IV antibiotics Goal to obtain cultures in OR today Continue plan of care   LOS: 1 day    Doylene Bode 01/02/2023, 7:37 AM

## 2023-01-02 NOTE — Telephone Encounter (Signed)
Called [facility] and spoke to Fairfield Glade, Production designer, theatre/television/film about wound vac dressing changes.  She said they do provide wound vac dressing changes and we would just need to send in a referral and to contact their hospital representative, Duwaine Maxin at [phone] when we are ready to send in the referral.

## 2023-01-02 NOTE — Transfer of Care (Signed)
Immediate Anesthesia Transfer of Care Note  Patient: Rachel Carlson  Procedure(s) Performed: INCISION AND DRAINAGE OF ABDOMINAL WOUND SEROMA  Patient Location: PACU  Anesthesia Type:General  Level of Consciousness: awake  Airway & Oxygen Therapy: Patient Spontanous Breathing and Patient connected to face mask oxygen  Post-op Assessment: Report given to RN and Post -op Vital signs reviewed and stable  Post vital signs: Reviewed and stable  Last Vitals:  Vitals Value Taken Time  BP 121/75 01/02/23 1319  Temp    Pulse 80 01/02/23 1321  Resp 14 01/02/23 1321  SpO2 100 % 01/02/23 1321  Vitals shown include unfiled device data.  Last Pain:  Vitals:   01/02/23 1138  TempSrc: Oral  PainSc:       Patients Stated Pain Goal: 0 (01/02/23 0754)  Complications: No notable events documented.

## 2023-01-02 NOTE — Plan of Care (Signed)
  Problem: Education: Goal: Knowledge of General Education information will improve Description: Including pain rating scale, medication(s)/side effects and non-pharmacologic comfort measures Outcome: Progressing   Problem: Clinical Measurements: Goal: Ability to maintain clinical measurements within normal limits will improve Outcome: Progressing   

## 2023-01-02 NOTE — Anesthesia Preprocedure Evaluation (Signed)
Anesthesia Evaluation  Patient identified by MRN, date of birth, ID band Patient awake    Reviewed: Allergy & Precautions, NPO status , Patient's Chart, lab work & pertinent test results  Airway Mallampati: II  TM Distance: >3 FB Neck ROM: Full    Dental no notable dental hx.    Pulmonary neg pulmonary ROS   Pulmonary exam normal        Cardiovascular hypertension, + DVT   Rhythm:Regular Rate:Normal     Neuro/Psych negative neurological ROS  negative psych ROS   GI/Hepatic hiatal hernia, PUD,GERD  Medicated,,Abdominal abscess   Endo/Other  negative endocrine ROS    Renal/GU negative Renal ROS  negative genitourinary   Musculoskeletal negative musculoskeletal ROS (+)    Abdominal Normal abdominal exam  (+)   Peds  Hematology  (+) Blood dyscrasia, anemia Lab Results      Component                Value               Date                      WBC                      3.9 (L)             01/02/2023                HGB                      9.1 (L)             01/02/2023                HCT                      28.6 (L)            01/02/2023                MCV                      98.3                01/02/2023                PLT                      300                 01/02/2023             Lab Results      Component                Value               Date                      NA                       138                 12/31/2022                K  3.9                 12/31/2022                CO2                      27                  12/31/2022                GLUCOSE                  100 (H)             12/31/2022                BUN                      19                  12/31/2022                CREATININE               0.44                01/02/2023                CALCIUM                  9.5                 12/31/2022                GFR                      85.66                08/13/2022                GFRNONAA                 >60                 01/02/2023              Anesthesia Other Findings   Reproductive/Obstetrics                             Anesthesia Physical Anesthesia Plan  ASA: 3  Anesthesia Plan: General   Post-op Pain Management:    Induction: Intravenous  PONV Risk Score and Plan: 3 and Ondansetron, Dexamethasone and Treatment may vary due to age or medical condition  Airway Management Planned: Mask and Oral ETT  Additional Equipment: None  Intra-op Plan:   Post-operative Plan: Extubation in OR  Informed Consent: I have reviewed the patients History and Physical, chart, labs and discussed the procedure including the risks, benefits and alternatives for the proposed anesthesia with the patient or authorized representative who has indicated his/her understanding and acceptance.     Dental advisory given  Plan Discussed with: CRNA  Anesthesia Plan Comments:        Anesthesia Quick Evaluation

## 2023-01-02 NOTE — Op Note (Signed)
Incision and Drainage Procedure Note  Pre-operative Diagnosis: Postoperative wound abscess, cellulitis  Post-operative Diagnosis: Postoperative wound seromas, cellulitis  Indications: Postoperative wound with surrounding cellulitis refractory to course of oral antibiotics.  Imaging findings suggestive of an abscess(es)  Surgeon: Antionette Char, MD  Procedure:: Incision and drainage of a postoperative wound  Anesthesia: General, local  Procedure Details  The procedure, risks and complications have been discussed in detail (including, but not limited to airway compromise, infection, bleeding) with the patient, and the patient has signed consent to the procedure.  The skin was sterilely prepped and draped over the affected area in the usual fashion. After adequate anesthesia, I&D with a #11 blade was performed of the caudad pocket.  The wound was probed and sharply debrided. The wound was irrigated.  Adequate hemostasis was noted.  The more cephalad pocket was manipulated in a similar fashion. A wound vac sponge was placed in the larger, caudad defect.  A pack was placed in the more cephalad defect. The wounds were infiltrated with 0.25% Marcaine.  At the close of the procedure the instrument counts were correct x 2.  She was awakened from anesthesia and transferred to the PACU awake and in stable condition.  Findings: Seromas x 2 at opposite ends of the wound; the more cephalad pocket was smaller and was pointing.  Surrounding erythema. The defects were probed.  The fascia was intact.  There were areas of fibrinous exudate in the subcutaneous layer overlying the fascia.  No necrotic areas or purulent drainage.  EBL: Minimal  Specimens: Wound cultures--aerobic, anaerobic  Condition: Stable   Complications: none.

## 2023-01-02 NOTE — Consult Note (Addendum)
WOC Nurse ostomy consult note; patient well known to [facility] team from previous admission; has colostomy placed by Dr. Pricilla Holm 12/12/2022  Stoma type/location: LUQ colostomy  Stomal assessment/size: 1 3/8" pink moist productive, above skin level  Peristomal assessment:  pouch intact that patient placed 12/2 so not assessed  Treatment options for stomal/peristomal skin: 2" barrier ring  Output soft brown stool in pouch  Ostomy pouching: 2 piece 2 1/4" skin barrier [place] [phone]), 2 1/4" pouch [place] [phone]) and 2" barrier ring [place] [phone])  Education provided: patient leaving room for OR therefore brief visit.  Will follow patient while inpatient for ostomy support. Patient has been educated on care of ostomy and says she has changed pouching system twice at home independently.  Wants to try a one piece pouch as having difficulty snapping 2 piece pouch together due to neuropathy in fingers. Will bring 1 piece flat flexible in room for next change.   Enrolled patient in [employer] DC program: no, enrolled previous admit   Placed 2 1/4" skin barrier, pouch and barrier rings in room.    Thank you,    Priscella Mann MSN, RN-BC, [employer] [phone]

## 2023-01-03 ENCOUNTER — Encounter: Payer: Self-pay | Admitting: Hematology

## 2023-01-03 ENCOUNTER — Encounter (HOSPITAL_COMMUNITY): Payer: Self-pay | Admitting: Obstetrics & Gynecology

## 2023-01-03 ENCOUNTER — Telehealth: Payer: Self-pay

## 2023-01-03 LAB — CBC
HCT: 32.2 % — ABNORMAL LOW (ref 36.0–46.0)
Hemoglobin: 10.1 g/dL — ABNORMAL LOW (ref 12.0–15.0)
MCH: 30.9 pg (ref 26.0–34.0)
MCHC: 31.4 g/dL (ref 30.0–36.0)
MCV: 98.5 fL (ref 80.0–100.0)
Platelets: 345 10*3/uL (ref 150–400)
RBC: 3.27 MIL/uL — ABNORMAL LOW (ref 3.87–5.11)
RDW: 17.4 % — ABNORMAL HIGH (ref 11.5–15.5)
WBC: 5.7 10*3/uL (ref 4.0–10.5)
nRBC: 0 % (ref 0.0–0.2)

## 2023-01-03 LAB — BASIC METABOLIC PANEL
Anion gap: 11 (ref 5–15)
BUN: 9 mg/dL (ref 8–23)
CO2: 20 mmol/L — ABNORMAL LOW (ref 22–32)
Calcium: 8.9 mg/dL (ref 8.9–10.3)
Chloride: 108 mmol/L (ref 98–111)
Creatinine, Ser: 0.46 mg/dL (ref 0.44–1.00)
GFR, Estimated: >60 mL/min (ref >60)
Glucose, Bld: 151 mg/dL — ABNORMAL HIGH (ref 70–99)
Potassium: 3.1 mmol/L — ABNORMAL LOW (ref 3.5–5.1)
Sodium: 139 mmol/L (ref 135–145)

## 2023-01-03 MED ORDER — LANSOPRAZOLE 15 MG PO CPDR: 15.0000 mg | DELAYED_RELEASE_CAPSULE | Freq: Every day | ORAL | Status: DC

## 2023-01-03 MED ORDER — ACETAMINOPHEN 325 MG PO TABS
650.0000 mg | ORAL_TABLET | Freq: Four times a day (QID) | ORAL | Status: DC | PRN
  Administered 2023-01-03: 650 mg via ORAL
  Filled 2023-01-03: qty 2

## 2023-01-03 MED ORDER — METRONIDAZOLE 500 MG PO TABS
500.0000 mg | ORAL_TABLET | Freq: Three times a day (TID) | ORAL | Status: DC
  Administered 2023-01-03 – 2023-01-04 (×4): 500 mg via ORAL
  Filled 2023-01-03 (×4): qty 1

## 2023-01-03 MED ORDER — CHLORHEXIDINE GLUCONATE CLOTH 2 % EX PADS
6.0000 | MEDICATED_PAD | Freq: Every day | CUTANEOUS | Status: DC
  Administered 2023-01-03 – 2023-01-09 (×7): 6 via TOPICAL

## 2023-01-03 MED ORDER — ENSURE ENLIVE PO LIQD
237.0000 mL | Freq: Two times a day (BID) | ORAL | Status: DC
  Administered 2023-01-03 – 2023-01-09 (×11): 237 mL via ORAL

## 2023-01-03 MED ORDER — LEVOFLOXACIN 500 MG PO TABS
500.0000 mg | ORAL_TABLET | Freq: Every day | ORAL | Status: DC
  Administered 2023-01-03 – 2023-01-04 (×2): 500 mg via ORAL
  Filled 2023-01-03 (×2): qty 1

## 2023-01-03 NOTE — Consult Note (Signed)
[facility] Liaison Consult Note    01/03/2023  Rachel Carlson Dec 03, 1945 [id number]  Insurance: [employer]   Primary Care Provider: Milinda Antis, Audrie Gallus, MD with Corinda Gubler at [facility]   this provider is listed for the transition of care follow up appointments  and Transition of care calls   [facility] Liaison screened the patient remotely at [facility]    The patient was screened for 30 day readmission hospitalization with noted medium risk score for unplanned readmission risk 2 ED visits and 2 hospital admissions in 6 months.  The patient was assessed for potential Community Care Coordination service needs for post hospital transition for care coordination. Review of patient's electronic medical record reveals patient is followed closely by Oncology team. Extensive review of electronic medical record to assess for additional needs.  Plan: [facility] Liaison will continue to follow progress and disposition to asess for post hospital community care coordination/management needs.  Referral request for community care coordination: currently anticipate follow up with [facility] team with provider to address any additional ongoing needs.   [facility] Community Care, Population Health does not replace or interfere with any arrangements made by the Inpatient Transition of Care team.   For questions contact:   Charlesetta Shanks, RN, BSN, CCM [place]  [facility] Liaison Direct Dial: [phone] or secure chat Email: [email or web address]

## 2023-01-03 NOTE — Telephone Encounter (Signed)
Per request of Warner Mccreedy NP, pt is scheduled for 12/13 @ 1:00pm

## 2023-01-03 NOTE — Progress Notes (Signed)
Mobility Specialist - Progress Note   01/03/23 1257  Mobility  Activity Ambulated independently in hallway  Level of Assistance Independent  Assistive Device None  Distance Ambulated (ft) 1000 ft  Activity Response Tolerated well  Mobility Referral Yes  Mobility visit 1 Mobility  Mobility Specialist Start Time (ACUTE ONLY) 1238  Mobility Specialist Stop Time (ACUTE ONLY) 1257  Mobility Specialist Time Calculation (min) (ACUTE ONLY) 19 min   Pt received in bathroom and agreeable to mobility. No complaints during session. Pt to bed after session with all needs met.    [facility]

## 2023-01-03 NOTE — Plan of Care (Signed)
  Problem: Clinical Measurements: Goal: Diagnostic test results will improve Outcome: Progressing   Problem: Activity: Goal: Risk for activity intolerance will decrease Outcome: Progressing   Problem: Nutrition: Goal: Adequate nutrition will be maintained Outcome: Adequate for Discharge

## 2023-01-03 NOTE — Telephone Encounter (Signed)
-----   Message from Doylene Bode sent at 01/03/2023  7:44 AM EST ----- You can move Fridays appt out for another week. Can go on day I am in clinic with tucker on my sch

## 2023-01-03 NOTE — Plan of Care (Signed)

## 2023-01-03 NOTE — TOC Initial Note (Addendum)
Transition of Care [facility]) - Initial/Assessment Note    Patient Details  Name: Rachel Carlson MRN: [id number] Date of Birth: 1945/02/27  Transition of Care [facility]) CM/SW Contact:    Adrian Prows, RN Phone Number: 01/03/2023, 3:07 PM  Clinical Narrative:                 TOC for d/c planning; spoke w/ pt, husband, and son in room; pt says she lives at home; she plans to return at d/c; she identified POC son [name] [phone]); pt verified she has PCP/insurance; she denies SDOH risks; pt says she has a walker, hospital bed, and a handicapped accessible bathroom; she also has [facility] w/ [facility] for wound vac; spoke w/ Art[facility] at [facility]; she says pt is active w/ agency, and resumption of care orders will be needed; per GYN ONC, RN's note dated 01/02/23, she spoke to [place], Production designer, theatre/television/film regarding wound vac dressing changes; she was told agency can provide  wound vac dressing changes, and referral should be sent agency's hospital rep, Duwaine Maxin [phone]) when ready to send in the referral; Warner Mccreedy, NP notified resumption of care orders needed for [facility] services; TOC is following.   Expected Discharge Plan: Home w Home Health Services Barriers to Discharge: Continued Medical Work up   Patient Goals and CMS Choice Patient states their goals for this hospitalization and ongoing recovery are:: home CMS Medicare.gov Compare Post Acute Care list provided to:: Patient        Expected Discharge Plan and Services   Discharge Planning Services: CM Consult Post Acute Care Choice: Resumption of Svcs/PTA Provider [facility] services w/ [facility]) Living arrangements for the past 2 months: Single Family Home                           HH Arranged: RN HH Agency: [facility] ([facility]) Date HH Agency Contacted: 01/03/23 Time HH Agency Contacted: 1505 Representative spoke with at [facility] Agency: Adele Dan  Prior Living Arrangements/Services Living arrangements for the past  2 months: Single Family Home Lives with:: Spouse Patient language and need for interpreter reviewed:: Yes Do you feel safe going back to the place where you live?: Yes      Need for Family Participation in Patient Care: Yes (Comment) Care giver support system in place?: Yes (comment) Current home services: DME, Home RN (hospital bed, walker, HHRN w/ [facility]) Criminal Activity/Legal Involvement Pertinent to Current Situation/Hospitalization: No - Comment as needed  Activities of Daily Living   ADL Screening (condition at time of admission) Independently performs ADLs?: Yes (appropriate for developmental age) Is the patient deaf or have difficulty hearing?: No Does the patient have difficulty seeing, even when wearing glasses/contacts?: No Does the patient have difficulty concentrating, remembering, or making decisions?: No  Permission Sought/Granted Permission sought to share information with : Case Manager Permission granted to share information with : Yes, Verbal Permission Granted  Share Information with NAME: Case Manager     Permission granted to share info w Relationship: [name] (son) [phone]     Emotional Assessment Appearance:: Appears stated age Attitude/Demeanor/Rapport: Gracious Affect (typically observed): Accepting Orientation: : Oriented to Self, Oriented to Place, Oriented to  Time, Oriented to Situation Alcohol / Substance Use: Not Applicable Psych Involvement: No (comment)  Admission diagnosis:  Postoperative infection [T81.40XA] Wound infection after surgery [T81.49XA] Patient Active Problem List   Diagnosis Date Noted   Wound infection after surgery 01/02/2023   Postoperative infection 01/01/2023  Erythema of wound 12/26/2022   Gynecologic malignancy (HCC) 12/12/2022   Iron deficiency anemia 10/25/2022   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   Elevated tumor markers 08/31/2022   DVT, lower extremity, distal (HCC) 08/01/2022   Ascites  07/27/2022   Left leg swelling 07/23/2022   Swelling abdomen 07/23/2022   History of vaginal bleeding 08/17/2021   Nonrheumatic mitral valve regurgitation 04/16/2019   Essential hypertension 04/11/2018   PVC's (premature ventricular contractions) 04/11/2018   Class 1 obesity due to excess calories with serious comorbidity and body mass index (BMI) of 34.0 to 34.9 in adult 12/06/2017   Genetic testing 09/26/2017   Family history of breast cancer    Family history of colon cancer    Family history of prostate cancer    Family history of melanoma    Family history of Lynch syndrome - son 08/04/2017   Prediabetes 07/20/2015   Estrogen deficiency 04/27/2015   GERD (gastroesophageal reflux disease) 03/23/2015   Hx of adenomatous polyp of colon 10/19/2014   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   Pain in joint, pelvic region and thigh 07/24/2014   Need for hepatitis C screening test 07/12/2014   Pelvic pain in female 07/12/2014   Vitamin D deficiency 07/03/2014   Encounter for Medicare annual wellness exam 07/08/2013   Osteoporosis 05/24/2013   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 [place] [address] Phone: [phone] Fax: [phone]     Social Determinants of Health (SDOH) Social History: SDOH Screenings   Food Insecurity: No Food Insecurity (01/03/2023)  Housing: Low Risk  (01/03/2023)  Transportation Needs: No Transportation Needs (01/03/2023)  Utilities: Not At Risk  (01/03/2023)  Alcohol Screen: Low Risk  (08/28/2022)  Depression (PHQ2-9): Low Risk  (08/28/2022)  Financial Resource Strain: Low Risk  (08/28/2022)  Physical Activity: Inactive (08/28/2022)  Social Connections: Moderately Integrated (08/28/2022)  Stress: No Stress Concern Present (08/28/2022)  Tobacco Use: Low Risk  (01/02/2023)  Health Literacy: Adequate Health Literacy (08/28/2022)   SDOH Interventions: Food Insecurity Interventions: Intervention Not Indicated, Inpatient TOC Housing Interventions: Intervention Not Indicated, Inpatient TOC Transportation Interventions: Intervention Not Indicated, Inpatient TOC Utilities Interventions: Intervention Not Indicated, Inpatient TOC   Readmission Risk Interventions    01/03/2023    2:59 PM  Readmission Risk Prevention Plan  Transportation Screening Complete  PCP or Specialist Appt within 5-7 Days Complete  Home Care Screening Complete  Medication Review (RN CM) Complete

## 2023-01-03 NOTE — Progress Notes (Addendum)
GYN Oncology Progress Note  Subjective: Ready to eat this am. No nausea or emesis. Decreased appetite at times. Pain is managed.  Tolerating wound VAC in place.   Objective: Vital signs in last 24 hours: Temp:  [97.5 F (36.4 C)-99.2 F (37.3 C)] 97.6 F (36.4 C) (12/05 0541) Pulse Rate:  [70-99] 83 (12/05 0541) Resp:  [11-20] 16 (12/05 0541) BP: (112-139)/(64-86) 130/75 (12/05 0541) SpO2:  [93 %-99 %] 93 % (12/05 0541) Weight:  [158 lb 15.2 oz (72.1 kg)] 158 lb 15.2 oz (72.1 kg) (12/04 1131) Last BM Date : 01/02/23 (emptied bag today)  Intake/Output from previous day: 12/04 0701 - 12/05 0700 In: 2072.5 [P.O.:1210; I.V.:503.9; IV Piggyback:358.6] Out: 1575 [Urine:1450; Drains:50; Stool:50; Blood:25]  Physical Examination (performed by Dr.Tucker): Alert, oriented, in no acute distress.  Lungs clear. Heart regular in rate and rhythm. Abdomen is soft, non-tender, non-distended. Ostomy appliance is intact with flatus and loose brown stool in bag. Stoma is pink/red. Incisional erythema has improved. Wound VAC in place to lower aspect of open midline abdominal incision with dressing intact. Upper abdominal dressing over upper aspect of incision is lightly stained with serosanguinous drainage.   Labs: Awaiting return of am labs.  Assessment: 77 y.o. 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 On 12/12/2022, she underwent diagnostic laparoscopy, conversion to exploratory laparotomy with radical tumor debulking including total omentectomy, resection of peritoneal disease, small bowel resection with 3 anastomosis, en bloc resection of the left fallopian tube and ovary as well as rectosigmoid colon, left ureterolysis, right salpingo-oophorectomy, end descending colostomy, cystoscopy with Dr. Eugene Garnet. Her hospital admission was from 12/12/2022 to 12/21/2022. When presenting to the office for staple removal, mild incisional erythema concerning  for early cellulitis was noted and she was started on keflex. She presented to the office for follow up and incision assessment on 12/31/2022 with worsening cellulitis of the lower aspect of the incision. A CT AP was ordered to further evaluate and antibiotics were changed to doxycycline and flagyl.   Her CT scan was performed on 01/01/2023 resulting:   1. 5.1 x 2.3 x 3.4 cm superficial abscess in the subcutaneous fat just below the umbilicus with surrounding skin thickening and cellulitis. 2. 4 cm thin elongated intra-abdominal abscess anteriorly near the liver. This appears to be too small to drain percutaneously. 3. Extensive surgical changes with small bowel anastomosis in the pelvis and a left-sided colostomy. No acute bowel findings. 4. Persistent moderate-sized left pleural effusion and small right pleural effusion with overlying atelectasis. 5. Stable hiatal hernia. 6. No obvious residual omental disease or peritoneal implants. 7. Aortic Atherosclerosis  After scan results, the patient was admitted to the hospital for IV antibiotics and incision and drainage. She is s/p incision and drainage on 01/02/2023.    Pain:  Pain is well-controlled on prn medications.  Heme: Hgb 9.1 and Hct 28.6 early yesterday am. Awaiting am labs.  ID: WBC 3.9 yesterday, previously 5.6. Admitted with incisional abscess and intra-abdominal abscess. Currently on zosyn and vancomycin. S/P incision and drainage in the OR on 01/02/23. Wound culture returned with few gram negative rods and reincubated.  CV: BP and HR are stable. Continue to monitor with vital signs while inpt.  GI:  Tolerating po: yes. Decreased appetite prior to admission. Has ostomy s/p bowel surgery listed above.  FEN: No critical values on 12/31/22 labs.   Prophylaxis: SCDs ordered. Eliquis reordered  Plan: Wound ostomy to change VAC tomorrow (01/04/23) and measure wound  Awiating culture results Continue antibiotics-will reach out to pharmacy  for input per Dr. Pricilla Holm. Plan for 7-10 day course of oral antibiotics when discharged Ensure ordered Continue plan of care   LOS: 2 days    Doylene Bode 01/03/2023, 6:53 AM

## 2023-01-03 NOTE — Consult Note (Signed)
WOC Nurse ostomy follow up; patient known to [facility] team from previous admission; end colostomy placed by Dr. Pricilla Holm 12/12/2022  Stoma type/location:  LUQ colostomy  Stomal assessment/size: 1  3/8" slightly oval, pink moist, slightly above skin level  Peristomal assessment: intact, has midline incision but this does not interfere with pouching  Treatment options for stomal/peristomal skin: 2" barrier ring  Output minimal soft stool  Ostomy pouching: 1pc flat flexible; did place a 1 piece convex [place] [phone]) today as stoma not as protruding as previously; may be due to abdominal topography with edema  Education provided: Patient has been educated on ostomy care and can tell me she empties pouch when 1/3 to 1/2 full and changes pouching system 2 times a week and prn soiling.  Has changed independently twice at home without difficulty.  We removed current pouch and cleaned around area with water moistened washcloth.  We sized today at 1 3/8", cut slightly oval.  Placed 2" barrier ring around stoma and then placed new skin barrier/pouch over barrier ring. Patient closed new pouch.     Enrolled patient in [place] Secure Start Discharge program: Yes on previous admission   WOC team will follow for ostomy support and NPWT M-W-F.  NPWT does not interfere with ostomy pouching.    Thank you,    Priscella Mann MSN, RN-BC, [employer] [phone]

## 2023-01-04 ENCOUNTER — Encounter: Payer: Medicare Other | Admitting: Gynecologic Oncology

## 2023-01-04 LAB — BASIC METABOLIC PANEL
Anion gap: 7 (ref 5–15)
BUN: 14 mg/dL (ref 8–23)
CO2: 23 mmol/L (ref 22–32)
Calcium: 8.8 mg/dL — ABNORMAL LOW (ref 8.9–10.3)
Chloride: 106 mmol/L (ref 98–111)
Creatinine, Ser: 0.52 mg/dL (ref 0.44–1.00)
GFR, Estimated: >60 mL/min (ref >60)
Glucose, Bld: 115 mg/dL — ABNORMAL HIGH (ref 70–99)
Potassium: 3.5 mmol/L (ref 3.5–5.1)
Sodium: 136 mmol/L (ref 135–145)

## 2023-01-04 LAB — CBC
HCT: 29.5 % — ABNORMAL LOW (ref 36.0–46.0)
Hemoglobin: 9.6 g/dL — ABNORMAL LOW (ref 12.0–15.0)
MCH: 31.7 pg (ref 26.0–34.0)
MCHC: 32.5 g/dL (ref 30.0–36.0)
MCV: 97.4 fL (ref 80.0–100.0)
Platelets: 285 10*3/uL (ref 150–400)
RBC: 3.03 MIL/uL — ABNORMAL LOW (ref 3.87–5.11)
RDW: 17.6 % — ABNORMAL HIGH (ref 11.5–15.5)
WBC: 5.2 10*3/uL (ref 4.0–10.5)
nRBC: 0 % (ref 0.0–0.2)

## 2023-01-04 MED ORDER — FAMOTIDINE 20 MG PO TABS
10.0000 mg | ORAL_TABLET | Freq: Every day | ORAL | Status: DC
  Administered 2023-01-04 – 2023-01-09 (×6): 10 mg via ORAL
  Filled 2023-01-04 (×6): qty 1

## 2023-01-04 MED ORDER — SIMETHICONE 80 MG PO CHEW
80.0000 mg | CHEWABLE_TABLET | Freq: Four times a day (QID) | ORAL | Status: DC
  Administered 2023-01-04 – 2023-01-09 (×19): 80 mg via ORAL
  Filled 2023-01-04 (×19): qty 1

## 2023-01-04 MED ORDER — PROCHLORPERAZINE MALEATE 5 MG PO TABS
5.0000 mg | ORAL_TABLET | Freq: Four times a day (QID) | ORAL | Status: DC | PRN
  Administered 2023-01-04 (×2): 5 mg via ORAL
  Filled 2023-01-04 (×3): qty 1

## 2023-01-04 MED ORDER — AMOXICILLIN-POT CLAVULANATE 875-125 MG PO TABS
1.0000 | ORAL_TABLET | Freq: Two times a day (BID) | ORAL | Status: DC
  Administered 2023-01-04 – 2023-01-09 (×10): 1 via ORAL
  Filled 2023-01-04 (×10): qty 1

## 2023-01-04 MED ORDER — PROCHLORPERAZINE EDISYLATE 10 MG/2ML IJ SOLN
5.0000 mg | INTRAMUSCULAR | Status: AC
  Administered 2023-01-04: 5 mg via INTRAVENOUS
  Filled 2023-01-04: qty 2

## 2023-01-04 NOTE — Plan of Care (Signed)
  Problem: Education: Goal: Knowledge of General Education information will improve Description Including pain rating scale, medication(s)/side effects and non-pharmacologic comfort measures Outcome: Progressing   

## 2023-01-04 NOTE — TOC Progression Note (Addendum)
Transition of Care [facility]) - Progression Note    Patient Details  Name: Rachel Carlson MRN: [id number] Date of Birth: 11/23/1945  Transition of Care [facility]) CM/SW Contact  Adrian Prows, RN Phone Number: 01/04/2023, 3:07 PM  Clinical Narrative:    Orders for wound vac signed; notified Blossom Hoops, [employer] rep; she requested demographics, orders, op note and H&P be faxed to [phone]; documents faxed per request; awaiting electronic confirmation.   -1527- electronic confirmation received  -1636- op noted dated 01/02/23 faxed to [phone]; electronic confirmation received.  Expected Discharge Plan: Home w Home Health Services Barriers to Discharge: Continued Medical Work up  Expected Discharge Plan and Services   Discharge Planning Services: CM Consult Post Acute Care Choice: Resumption of Svcs/PTA Provider [facility] services w/ [facility]) Living arrangements for the past 2 months: Single Family Home                           HH Arranged: RN [facility] Agency: [facility] ([facility]) Date HH Agency Contacted: 01/03/23 Time HH Agency Contacted: 1505 Representative spoke with at [facility] Agency: Adele Dan   Social Determinants of Health (SDOH) Interventions SDOH Screenings   Food Insecurity: No Food Insecurity (01/03/2023)  Housing: Low Risk  (01/03/2023)  Transportation Needs: No Transportation Needs (01/03/2023)  Utilities: Not At Risk (01/03/2023)  Alcohol Screen: Low Risk  (08/28/2022)  Depression (PHQ2-9): Low Risk  (08/28/2022)  Financial Resource Strain: Low Risk  (08/28/2022)  Physical Activity: Inactive (08/28/2022)  Social Connections: Moderately Integrated (08/28/2022)  Stress: No Stress Concern Present (08/28/2022)  Tobacco Use: Low Risk  (01/02/2023)  Health Literacy: Adequate Health Literacy (08/28/2022)    Readmission Risk Interventions    01/03/2023    2:59 PM  Readmission Risk Prevention Plan  Transportation Screening Complete  PCP or Specialist Appt  within 5-7 Days Complete  Home Care Screening Complete  Medication Review (RN CM) Complete

## 2023-01-04 NOTE — Consult Note (Signed)
WOC Nurse Consult Note: postop I&D wound abscess 01/02/2023 by Dr. Tamela Oddi with NPWT placement  Reason for Consult: change NPWT  Wound type: full thickness post I&D  Pressure Injury POA: NA  Measurement: 4.5 cm x 2.2 cm x 3.5 cm  Wound bed: 100% red moist, some subcutaneous tissue visualized  Drainage (amount, consistency, odor) minimal serosanguinous  Periwound: mild erythema  Dressing procedure/placement/frequency: Removed old NPWT dressing Cleansed wound with normal saline  Filled wound with 1 piece of black foam   Sealed NPWT dressing at HG Patient received no pain medication  prior to dressing change Patient tolerated procedure well   WOC nurse will continue to provide NPWT dressing changes  due to the complexity of the dressing change, next change Monday 01/07/2023.    1 small black foam kit left at bedside.    I also performed NS wet to dry dressing to superior wound measured at 3.2 cm x 1.6 cm x 2.3 cm; 100% red moist.  Discussed with patient right now surgeon has ordered twice daily NS wet to dry dressings and instructed her on how to perform at home if this is still required.    WOC team will follow as above.   Thank you,    Priscella Mann MSN, RN-BC, [employer] [phone]

## 2023-01-04 NOTE — Progress Notes (Signed)
GYN Oncology Progress Note  Subjective: Pt had nausea yesterday and this am. Decreased appetite. Feels poorly this am. Has not had antiemetic recently. States she does not want to talk which triggers nausea and feelings of emesis. Pain is managed.  Tolerating wound VAC in place.   Objective: Vital signs in last 24 hours: Temp:  [97.7 F (36.5 C)-98.3 F (36.8 C)] 98 F (36.7 C) (12/06 0528) Pulse Rate:  [80-103] 82 (12/06 0528) Resp:  [17-18] 18 (12/06 0528) BP: (116-139)/(57-73) 131/69 (12/06 0528) SpO2:  [97 %-98 %] 97 % (12/06 0528) Last BM Date : 01/03/23  Intake/Output from previous day: 12/05 0701 - 12/06 0700 In: 900 [P.O.:900] Out: 2325 [Urine:2300; Drains:25]  Physical Examination (performed by Dr.Tucker): Alert, oriented, in no acute distress.  Lungs clear. Heart regular in rate and rhythm. Abdomen is soft, non-tender, non-distended. Ostomy appliance is intact with flatus and loose brown stool in bag. Stoma is pink/red. Incisional erythema has improved. Wound VAC in place to lower aspect of open midline abdominal incision with dressing intact. Upper abdominal dressing over upper aspect of incision is lightly stained with serosanguinous drainage. Wond margins are free of visible cancer.  Labs: CBC    Component Value Date/Time   WBC 5.2 01/04/2023 0141   RBC 3.03 (L) 01/04/2023 0141   HGB 9.6 (L) 01/04/2023 0141   HGB 9.8 (L) 12/31/2022 1551   HCT 29.5 (L) 01/04/2023 0141   PLT 285 01/04/2023 0141   PLT 361 12/31/2022 1551   MCV 97.4 01/04/2023 0141   MCH 31.7 01/04/2023 0141   MCHC 32.5 01/04/2023 0141   RDW 17.6 (H) 01/04/2023 0141   LYMPHSABS 0.9 01/02/2023 0240   MONOABS 0.4 01/02/2023 0240   EOSABS 0.1 01/02/2023 0240   BASOSABS 0.0 01/02/2023 0240    BMET    Component Value Date/Time   NA 136 01/04/2023 0141   K 3.5 01/04/2023 0141   CL 106 01/04/2023 0141   CO2 23 01/04/2023 0141   GLUCOSE 115 (H) 01/04/2023 0141   BUN 14 01/04/2023 0141    CREATININE 0.52 01/04/2023 0141   CALCIUM 8.8 (L) 01/04/2023 0141   GFRNONAA >60 01/04/2023 0141    Assessment: 77 y.o. 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 On 12/12/2022, she underwent diagnostic laparoscopy, conversion to exploratory laparotomy with radical tumor debulking including total omentectomy, resection of peritoneal disease, small bowel resection with 3 anastomosis, en bloc resection of the left fallopian tube and ovary as well as rectosigmoid colon, left ureterolysis, right salpingo-oophorectomy, end descending colostomy, cystoscopy with Dr. Eugene Garnet. Her hospital admission was from 12/12/2022 to 12/21/2022. When presenting to the office for staple removal, mild incisional erythema concerning for early cellulitis was noted and she was started on keflex. She presented to the office for follow up and incision assessment on 12/31/2022 with worsening cellulitis of the lower aspect of the incision. A CT AP was ordered to further evaluate and antibiotics were changed to doxycycline and flagyl.   Her CT scan was performed on 01/01/2023 resulting:   1. 5.1 x 2.3 x 3.4 cm superficial abscess in the subcutaneous fat just below the umbilicus with surrounding skin thickening and cellulitis. 2. 4 cm thin elongated intra-abdominal abscess anteriorly near the liver. This appears to be too small to drain percutaneously. 3. Extensive surgical changes with small bowel anastomosis in the pelvis and a left-sided colostomy. No acute bowel findings. 4. Persistent moderate-sized left pleural effusion and small right pleural effusion with  overlying atelectasis. 5. Stable hiatal hernia. 6. No obvious residual omental disease or peritoneal implants. 7. Aortic Atherosclerosis  After scan results, the patient was admitted to the hospital for IV antibiotics and incision and drainage. She is s/p incision and drainage on 01/02/2023. Culture prelim with few gram  neg rods, reincubated.   Pain:  Pain is well-controlled on prn medications.  Heme: Hgb 9.6 and Hct 29.5 this am-overall stable.  ID: WBC 5.2 this am. Admitted with incisional abscess and intra-abdominal abscess. Initially on zosyn and vancomycin, transitioned to oral levaquin and flagyl yesterday-may be contributing to worsening nausea. Will see if nausea improves with antiemetics, if not may consider changing oral antibiotics to Augmentin. S/P incision and drainage in the OR on 01/02/23. Wound culture returned with few gram negative rods and reincubated.  CV: BP and HR are stable. Continue to monitor with vital signs while inpt.  GI:  Tolerating po: yes. Decreased appetite prior to admission. Has ostomy s/p bowel surgery listed above. Discussed use of antiemetics.  FEN: No critical values on 01/04/23 labs.   Prophylaxis: SCDs ordered. Eliquis ordered.  Plan: -Recommended to take antiemetics if needed. -Change dressing twice daily wet to dry in upper aspect of incision -Plan for wound ostomy to change VAC today (01/04/23) and measure wound. Form on chart for recommendations in regards to home VAC. -Awaiting final culture results -Will see if nausea improves with antiemetics. If not, consider changing oral antibiotics. Plan for 7-10 day course of oral antibiotics when discharged -Continue plan of care   LOS: 3 days    Rachel Carlson 01/04/2023, 7:13 AM

## 2023-01-05 ENCOUNTER — Encounter: Payer: Self-pay | Admitting: Hematology

## 2023-01-05 ENCOUNTER — Other Ambulatory Visit (HOSPITAL_COMMUNITY): Payer: Self-pay

## 2023-01-05 ENCOUNTER — Other Ambulatory Visit (HOSPITAL_BASED_OUTPATIENT_CLINIC_OR_DEPARTMENT_OTHER): Payer: Self-pay

## 2023-01-05 MED ORDER — CIPROFLOXACIN HCL 500 MG PO TABS
500.0000 mg | ORAL_TABLET | Freq: Two times a day (BID) | ORAL | Status: DC
  Administered 2023-01-05 – 2023-01-09 (×8): 500 mg via ORAL
  Filled 2023-01-05 (×8): qty 1

## 2023-01-05 MED ORDER — AMOXICILLIN-POT CLAVULANATE 875-125 MG PO TABS
1.0000 | ORAL_TABLET | Freq: Two times a day (BID) | ORAL | 0 refills | Status: AC
  Filled 2023-01-05: qty 20, 10d supply, fill #0

## 2023-01-05 MED ORDER — MIRTAZAPINE 15 MG PO TABS
15.0000 mg | ORAL_TABLET | Freq: Every day | ORAL | Status: DC
  Administered 2023-01-05 – 2023-01-08 (×4): 15 mg via ORAL
  Filled 2023-01-05 (×4): qty 1

## 2023-01-05 MED ORDER — MIRTAZAPINE 15 MG PO TABS
15.0000 mg | ORAL_TABLET | Freq: Every day | ORAL | 4 refills | Status: DC
  Filled 2023-01-05: qty 30, 30d supply, fill #0

## 2023-01-05 MED ORDER — SIMETHICONE 80 MG PO CHEW
80.0000 mg | CHEWABLE_TABLET | Freq: Four times a day (QID) | ORAL | 4 refills | Status: DC | PRN
  Filled 2023-01-05: qty 30, 8d supply, fill #0

## 2023-01-05 NOTE — Progress Notes (Signed)
3 Days Post-Op Procedure(s) (LRB): INCISION AND DRAINAGE OF ABDOMINAL WOUND SEROMA (N/A)  Subjective: Patient reports feeling much better.  Denies any nausea this morning.  Increase in bowel function overnight.  Denies cramping that she was feeling yesterday afternoon.  Denies urinary symptoms.  Denies significant abdominal pain.  Objective: Vital signs in last 24 hours: Temp:  [97.4 F (36.3 C)-97.9 F (36.6 C)] 97.9 F (36.6 C) (12/07 0603) Pulse Rate:  [88-95] 95 (12/07 0603) Resp:  [17-19] 17 (12/07 0603) BP: (124-130)/(63-73) 130/73 (12/07 0603) SpO2:  [96 %-97 %] 96 % (12/07 0603) Last BM Date : 01/03/23  Intake/Output from previous day: 12/06 0701 - 12/07 0700 In: 480 [P.O.:480] Out: 2820 [Urine:1900; Drains:20; Stool:900]  Physical Examination: General: Alert, oriented, no acute distress. HEENT: Posterior oropharynx clear, sclera anicteric. Chest: Decreased breath sounds in the lung bases, left greater than right, upper lung fields are clear to auscultation bilaterally.  Port site clean. Cardiovascular: Regular rate and rhythm, no murmurs. Abdomen: soft, nontender.  Normoactive bowel sounds.  No masses or hepatosplenomegaly appreciated.  Wound VAC in place with no surrounding erythema of the skin.  Wet-to-dry dressing in place at the upper aspect of the incision.  Ostomy was some brown stool, pink and viable. Extremities: Grossly normal range of motion.  Warm, well perfused.  No edema bilaterally.  Labs: None new  Aerobic/anaerobic culture 12/4:  Gram stain - No WBC, no organisms Culture: few enterobacter species, few enterococcus faecalis, no anaerobes isolated Culture in progress for 5 days  Assessment:  77 y.o. 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 Started on Keflex  at the time of her staple removal visit for incisional erythema concerning for early cellulitis. Given worsening cellulitis of the lower aspect of the incision at her visit on 12/2, the patient was transitioned to doxycycline and flagyl. CT A/P on 12/3 revealed a 5/1 cm superficial abscess in the subcutaneous issue below the umbilicus and a 4 cm thin elongated intra-abdominal abscess anterior to the liver that would be too small to drain percutaneously.  Now s/p I&D on 12/4. Started on IV Zosyn/vanc with transition to levo/flagyl PO on 12/5. Given significant nausea, transitioned to Augmentin on 12/6.  Superficial surgical site infection: Excellent source control after I&D.  Patient had significant nausea with Levaquin and Flagyl, transition to Bactrim yesterday which she is tolerating very well.  Plan to discharge with a total course of approximately 10 days.  Awaiting insurance approval for wound VAC.  Ready for discharge otherwise as long as she tolerates improved p.o. intake today.  Nausea: Appears to be resolved after discontinuing Levaquin and Flagyl.  Primary peritoneal cancer: Message sent to medical oncologist regarding superficial wound infection and need to delay restarting chemotherapy by 1-2 weeks.  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  She was interested in starting this today.  Plan: Advance diet, awaiting insurance approval for home wound vac. Dispo:  Discharge plan to include home health The patient is to be discharged to home. Possibly later today vs Monday.   LOS: 4 days    Carver Fila 01/05/2023, 8:13 AM

## 2023-01-05 NOTE — Progress Notes (Signed)
Pt awaiting insurance authorization for home wound vac before she discharges home per secure chat with Dr Pricilla Holm

## 2023-01-05 NOTE — Plan of Care (Signed)
  Problem: Education: Goal: Knowledge of General Education information will improve Description: Including pain rating scale, medication(s)/side effects and non-pharmacologic comfort measures Outcome: Adequate for Discharge   Problem: Health Behavior/Discharge Planning: Goal: Ability to manage health-related needs will improve Outcome: Adequate for Discharge   Problem: Clinical Measurements: Goal: Ability to maintain clinical measurements within normal limits will improve Outcome: Adequate for Discharge Goal: Will remain free from infection Outcome: Adequate for Discharge Goal: Diagnostic test results will improve Outcome: Adequate for Discharge Goal: Respiratory complications will improve Outcome: Adequate for Discharge Goal: Cardiovascular complication will be avoided Outcome: Adequate for Discharge   Problem: Activity: Goal: Risk for activity intolerance will decrease Outcome: Adequate for Discharge   Problem: Nutrition: Goal: Adequate nutrition will be maintained Outcome: Adequate for Discharge   Problem: Coping: Goal: Level of anxiety will decrease Outcome: Adequate for Discharge   Problem: Elimination: Goal: Will not experience complications related to bowel motility Outcome: Adequate for Discharge Goal: Will not experience complications related to urinary retention Outcome: Adequate for Discharge   Problem: Pain Management: Goal: General experience of comfort will improve Outcome: Adequate for Discharge   Problem: Safety: Goal: Ability to remain free from injury will improve Outcome: Adequate for Discharge   Problem: Skin Integrity: Goal: Risk for impaired skin integrity will decrease Outcome: Adequate for Discharge   Problem: Education: Goal: Knowledge of the prescribed therapeutic regimen will improve Outcome: Adequate for Discharge Goal: Understanding of sexual limitations or changes related to disease process or condition will improve Outcome:  Adequate for Discharge Goal: Individualized Educational Video(s) Outcome: Adequate for Discharge   Problem: Self-Concept: Goal: Communication of feelings regarding changes in body function or appearance will improve Outcome: Adequate for Discharge   Problem: Skin Integrity: Goal: Demonstration of wound healing without infection will improve Outcome: Adequate for Discharge

## 2023-01-05 NOTE — Progress Notes (Signed)
Wet to dry dressing change complete. Dressing had minimal drainage.  Pt tolerated well, and verbalized understanding to complete this on her own at home.  Pt was given supplies to last about 2 weeks.  Pt received ordered prescriptions from pharmacy that have been picked up for her by staff.  Pt is also passing gas

## 2023-01-05 NOTE — TOC Progression Note (Signed)
Transition of Care [facility]) - Progression Note    Patient Details  Name: Rachel Carlson MRN: [id number] Date of Birth: [birth year]  [facility]) CM/SW Contact  Adrian Prows, RN Phone Number: 01/05/2023, 1:02 PM  Clinical Narrative:    [name] w/ Blossom Hoops at [facility]; she says DME has not yet been approved; she also says if approved, the device will have to be delivered to hospital room by courier; Dr Pricilla Holm notified; awaiting auth.   Expected Discharge Plan: Home w Home Health Services Barriers to Discharge: Continued Medical Work up  Expected Discharge Plan and Services   Discharge Planning Services: CM Consult Post Acute Care Choice: Resumption of Svcs/PTA Provider [facility] services w/ [facility]) Living arrangements for the past 2 months: Single Family Home                           HH Arranged: RN [facility] Agency: [facility] ([facility]) Date HH Agency Contacted: 01/03/23 Time HH Agency Contacted: [phone] Representative spoke with at [facility] Agency: Adele Dan   Social Determinants of Health (SDOH) Interventions SDOH Screenings   Food Insecurity: No Food Insecurity (01/03/2023)  Housing: Low Risk  (01/03/2023)  Transportation Needs: No Transportation Needs (01/03/2023)  Utilities: Not At Risk (01/03/2023)  Alcohol Screen: Low Risk  (08/28/2022)  Depression (PHQ2-9): Low Risk  (08/28/2022)  Financial Resource Strain: Low Risk  (08/28/2022)  Physical Activity: Inactive (08/28/2022)  Social Connections: Moderately Integrated (08/28/2022)  Stress: No Stress Concern Present (08/28/2022)  Tobacco Use: Low Risk  (01/02/2023)  Health Literacy: Adequate Health Literacy (08/28/2022)    Readmission Risk Interventions    01/03/2023    2:59 PM  Readmission Risk Prevention Plan  Transportation Screening Complete  PCP or Specialist Appt within 5-7 Days Complete  Home Care Screening Complete  Medication Review (RN CM) Complete

## 2023-01-05 NOTE — Discharge Instructions (Signed)
Please don't take the trazodone at night while you are trying the mirtazapine.  Continue taking Augmentin and Cipro. If your incision is healing well with no signs of infection at your Tuesday appt with Ahliya Glatt, we may recommend stopping the antibiotics at that time. If you start feeling poorly, develop fever, wound redness etc, please call the office to be seen right away.  Remove topper dressings and allow water to run over dressings allowing them to "fall" out and replace with clean packing topper when out of shower.   Activity: 1. Be up and out of the bed during the day.    2. No lifting or straining for 6 weeks.  Medications:  - Take ibuprofen and tylenol first line for pain control. Take these regularly (every 6 hours) to decrease the build up of pain.  Diet: 1. Low sodium Heart Healthy Diet is recommended.  2. It is safe to use a laxative if you have difficulty moving your bowels. Keep an eye on your ostomy output. If you need to take miralax you can. We want to try to avoid dehydration.   Wound Care: 1. Keep clean and dry.  Change both dressings in openings in your abdominal incisional once daily and more if soiled. You can perform a wet to dry dressing change on both areas. You can remove the dressings, shower, then replace. Home health will assist with the ostomy, wound assessments.   Reasons to call the Doctor:  Fever - Oral temperature greater than 100.4 degrees Fahrenheit Foul-smelling vaginal discharge Difficulty urinating Nausea and vomiting Increased pain at the site of the incision that is unrelieved with pain medicine. Difficulty breathing with or without chest pain New calf pain especially if only on one side Sudden, continuing increased vaginal bleeding with or without clots.   Follow-up: 1. See Dr. Pricilla Holm or Efraim Kaufmann for follow-up in one week (the office will call you with this appointment)  Contacts: For questions or concerns you should contact:  Dr.  Eugene Garnet at [phone]  After hours and on week-ends call [phone] and ask to speak to the physician on call for Gynecologic Oncology

## 2023-01-06 LAB — MAGNESIUM: Magnesium: 2.2 mg/dL (ref 1.7–2.4)

## 2023-01-06 LAB — BASIC METABOLIC PANEL
Anion gap: 5 (ref 5–15)
BUN: 14 mg/dL (ref 8–23)
CO2: 26 mmol/L (ref 22–32)
Calcium: 8.9 mg/dL (ref 8.9–10.3)
Chloride: 105 mmol/L (ref 98–111)
Creatinine, Ser: 0.5 mg/dL (ref 0.44–1.00)
GFR, Estimated: >60 mL/min (ref >60)
Glucose, Bld: 109 mg/dL — ABNORMAL HIGH (ref 70–99)
Potassium: 3.5 mmol/L (ref 3.5–5.1)
Sodium: 136 mmol/L (ref 135–145)

## 2023-01-06 LAB — PHOSPHORUS: Phosphorus: 4.3 mg/dL (ref 2.5–4.6)

## 2023-01-06 LAB — CBC
HCT: 33.9 % — ABNORMAL LOW (ref 36.0–46.0)
Hemoglobin: 10.9 g/dL — ABNORMAL LOW (ref 12.0–15.0)
MCH: 31.3 pg (ref 26.0–34.0)
MCHC: 32.2 g/dL (ref 30.0–36.0)
MCV: 97.4 fL (ref 80.0–100.0)
Platelets: 344 10*3/uL (ref 150–400)
RBC: 3.48 MIL/uL — ABNORMAL LOW (ref 3.87–5.11)
RDW: 17.2 % — ABNORMAL HIGH (ref 11.5–15.5)
WBC: 4.7 10*3/uL (ref 4.0–10.5)
nRBC: 0 % (ref 0.0–0.2)

## 2023-01-06 NOTE — Progress Notes (Signed)
Nurse allowed pt to do her wet to dry dressing change to mid upper abdomen.  Pt was able to do dressing change without difficulty.

## 2023-01-06 NOTE — TOC Progression Note (Signed)
Transition of Care [facility]) - Progression Note    Patient Details  Name: Rachel Carlson MRN: [id number] Date of Birth: 07-16-1945  [facility] CM/SW Contact  Adrian Prows, RN Phone Number: 01/06/2023, 3:29 PM  Clinical Narrative:    [name] w/ Blossom Hoops, [employer] rep; she says auth for wound vac still pending.   Expected Discharge Plan: Home w Home Health Services Barriers to Discharge: Continued Medical Work up  Expected Discharge Plan and Services   Discharge Planning Services: CM Consult Post Acute Care Choice: Resumption of Svcs/PTA Provider [facility] services w/ [facility]) Living arrangements for the past 2 months: Single Family Home                           HH Arranged: RN [facility] Agency: [facility] ([facility]) Date HH Agency Contacted: 01/03/23 Time HH Agency Contacted: [phone] Representative spoke with at [facility] Agency: Adele Dan   Social Determinants of Health (SDOH) Interventions SDOH Screenings   Food Insecurity: No Food Insecurity (01/03/2023)  Housing: Low Risk  (01/03/2023)  Transportation Needs: No Transportation Needs (01/03/2023)  Utilities: Not At Risk (01/03/2023)  Alcohol Screen: Low Risk  (08/28/2022)  Depression (PHQ2-9): Low Risk  (08/28/2022)  Financial Resource Strain: Low Risk  (08/28/2022)  Physical Activity: Inactive (08/28/2022)  Social Connections: Moderately Integrated (08/28/2022)  Stress: No Stress Concern Present (08/28/2022)  Tobacco Use: Low Risk  (01/02/2023)  Health Literacy: Adequate Health Literacy (08/28/2022)    Readmission Risk Interventions    01/03/2023    2:59 PM  Readmission Risk Prevention Plan  Transportation Screening Complete  PCP or Specialist Appt within 5-7 Days Complete  Home Care Screening Complete  Medication Review (RN CM) Complete

## 2023-01-06 NOTE — Progress Notes (Signed)
4 Days Post-Op Procedure(s) (LRB): INCISION AND DRAINAGE OF ABDOMINAL WOUND SEROMA (N/A)  Subjective: Patient reports doing well. Denies nausea yesterday. Slept very well last night. Voiding without difficult. Improving output from ostomy.  Objective: Vital signs in last 24 hours: Temp:  [97.8 F (36.6 C)-97.9 F (36.6 C)] 97.9 F (36.6 C) (12/08 0451) Pulse Rate:  [83-90] 90 (12/08 0451) Resp:  [16-18] 18 (12/08 0451) BP: (108-123)/(56-78) 123/56 (12/08 0451) SpO2:  [96 %-98 %] 96 % (12/08 0451) Last BM Date : 01/05/23  Intake/Output from previous day: 12/07 0701 - 12/08 0700 In: 360 [P.O.:360] Out: 2250 [Urine:1600; Drains:50; Stool:600]  Physical Examination: General: Alert, oriented, no acute distress. HEENT: Posterior oropharynx clear, sclera anicteric. Chest: Decreased breath sounds in the lung bases, left greater than right, upper lung fields are clear to auscultation bilaterally.  Port site clean. Cardiovascular: Regular rate and rhythm, no murmurs. Abdomen: soft, nontender.  Normoactive bowel sounds.  No masses or hepatosplenomegaly appreciated.  Wound VAC in place with no surrounding erythema of the skin.  Wet-to-dry dressing in place at the upper aspect of the incision.  Ostomy was some brown stool, pink and viable. Extremities: Grossly normal range of motion.  Warm, well perfused.  No edema bilaterally.  Labs: Am labs pending  Aerobic/anaerobic culture 12/4:  Gram stain - No WBC, no organisms Culture: few enterobacter cloacae, few enterococcus faecalis, rare serratia marcescens, rare pseudomonas aeruginosa Culture in progress for 5 days  Assessment:  77 y.o. 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 Started on Keflex at the time of her staple removal visit for incisional  erythema concerning for early cellulitis. Given worsening cellulitis of the lower aspect of the incision at her visit on 12/2, the patient was transitioned to doxycycline and flagyl. CT A/P on 12/3 revealed a 5/1 cm superficial abscess in the subcutaneous issue below the umbilicus and a 4 cm thin elongated intra-abdominal abscess anterior to the liver that would be too small to drain percutaneously.  Now s/p I&D on 12/4. Started on IV Zosyn/vanc with transition to levo/flagyl PO on 12/5. Given significant nausea, transitioned to Augmentin on 12/6, Cipro added 12/7.   Superficial surgical site infection: Excellent source control after I&D.  Patient had significant nausea with Levaquin and Flagyl, transitioned to Augmentin 12/6 which she is tolerating very well.  Cipro added on 12/7 given sensitivities. Plan to discharge with a total course of approximately 10 days.  Awaiting insurance approval for wound VAC.  Ready for discharge otherwise.   Nausea: Resolved after discontinuing Levaquin and Flagyl.   Primary peritoneal cancer: Message sent to medical oncologist regarding superficial wound infection and need to delay restarting chemotherapy by 1-2 weeks.   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    Plan: Awaiting insurance approval for home wound vac. Dispo:  Discharge plan to include home health The patient is to be discharged to home.    LOS: 5 days    Carver Fila 01/06/2023, 8:12 AM

## 2023-01-07 ENCOUNTER — Other Ambulatory Visit (HOSPITAL_COMMUNITY): Payer: Self-pay

## 2023-01-07 LAB — AEROBIC/ANAEROBIC CULTURE W GRAM STAIN (SURGICAL/DEEP WOUND): Gram Stain: NONE SEEN

## 2023-01-07 MED ORDER — POLYETHYLENE GLYCOL 3350 17 G PO PACK
17.0000 g | PACK | Freq: Every day | ORAL | Status: DC
  Administered 2023-01-07 – 2023-01-09 (×3): 17 g via ORAL
  Filled 2023-01-07 (×3): qty 1

## 2023-01-07 MED ORDER — CIPROFLOXACIN HCL 500 MG PO TABS
500.0000 mg | ORAL_TABLET | Freq: Two times a day (BID) | ORAL | 0 refills | Status: DC
  Filled 2023-01-07: qty 20, 10d supply, fill #0

## 2023-01-07 NOTE — Consult Note (Signed)
WOC Nurse wound follow up Wound type: surgical  Measurement: 4cm x 2cm x 2cm  Wound bed:100% clean Drainage (amount, consistency, odor) serosanguinous  Periwound: intact  Dressing procedure/placement/frequency: Removed old NPWT dressing Filled wound with  _1___ piece of black foam  Sealed NPWT dressing at HG Patient tolerated procedure well  WOC nurse will continue to provide NPWT dressing changed due to the complexity of the dressing change.  And continued support with ostomy care  Rachel Carlson [facility], CNS, CWON-AP [phone]

## 2023-01-07 NOTE — Plan of Care (Signed)
  Problem: Education: Goal: Knowledge of General Education information will improve Description: Including pain rating scale, medication(s)/side effects and non-pharmacologic comfort measures Outcome: Progressing   Problem: Nutrition: Goal: Adequate nutrition will be maintained Outcome: Progressing   

## 2023-01-07 NOTE — Plan of Care (Signed)
  Problem: Education: Goal: Knowledge of General Education information will improve Description: Including pain rating scale, medication(s)/side effects and non-pharmacologic comfort measures Outcome: Progressing   Problem: Pain Management: Goal: General experience of comfort will improve Outcome: Progressing

## 2023-01-07 NOTE — Progress Notes (Signed)
GYN Oncology Progress Note  5 Days Post-Op Procedure(s) (LRB): INCISION AND DRAINAGE OF ABDOMINAL WOUND SEROMA (N/A)  Subjective: Patient reports having nausea this am that resolved without medication. No emesis. Reports no output including flatus in ostomy since yesterday. She has been ambulating in the halls with assist. Voiding without difficulty. No pain reported. Does not feel her abdomen is more distended than usual.   Objective: Vital signs in last 24 hours: Temp:  [97.9 F (36.6 C)-98.3 F (36.8 C)] 98.2 F (36.8 C) (12/09 0534) Pulse Rate:  [91-105] 91 (12/09 0534) Resp:  [15-18] 18 (12/09 0534) BP: (116-125)/(66-82) 116/69 (12/09 0534) SpO2:  [96 %-98 %] 96 % (12/09 0534) Last BM Date : 01/06/23  Intake/Output from previous day: 12/08 0701 - 12/09 0700 In: 660 [P.O.:660] Out: 2250 [Urine:2200; Stool:50]  Physical Examination: General: Alert, oriented, no acute distress. Chest: Decreased breath sounds in the left lung base. Upper lung fields and RLL are clear to auscultation bilaterally.   Cardiovascular: Regular rate and rhythm, no murmurs. Abdomen: soft, nontender.  Normoactive bowel sounds.  No masses or hepatosplenomegaly appreciated.  Wound VAC in place with no surrounding erythema of the skin.  Wet-to-dry dressing in place at the upper aspect of the incision-wound underneath clean/pink.  Ostomy appliance with no flatus, no stool except for dried stool on lining. Stoma pink and viable. Extremities: Grossly normal range of motion.  Warm, well perfused.  No edema bilaterally.  Labs: Labs last yesterday afternoon  Aerobic/anaerobic culture 12/4:  Gram stain - No WBC, no organisms Culture: few enterobacter cloacae, few enterococcus faecalis, rare serratia marcescens, rare pseudomonas aeruginosa Culture in progress for 5 days  Assessment: 77 y.o. 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 Started on Keflex at the time of her staple removal visit for incisional erythema concerning for early cellulitis. Given worsening cellulitis of the lower aspect of the incision at her visit on 12/2, the patient was transitioned to doxycycline and flagyl. CT A/P on 12/3 revealed a 5/1 cm superficial abscess in the subcutaneous issue below the umbilicus and a 4 cm thin elongated intra-abdominal abscess anterior to the liver that would be too small to drain percutaneously. Now s/p I&D on 12/4. Started on IV Zosyn/vanc with transition to levo/flagyl PO on 12/5. Given significant nausea, transitioned to Augmentin on 12/6, Cipro added 12/7.   Superficial surgical site infection: Excellent source control after I&D.  Patient had significant nausea with Levaquin and Flagyl, transitioned to Augmentin 12/6 which she is tolerating very well.  Cipro added on 12/7 given sensitivities. Plan to discharge with a total course of approximately 10 days.  Awaiting insurance approval for wound VAC.     GI/Nausea: No output from ostomy. Nausea this am, previously improved after discontinuing Levaquin and Flagyl.   Primary peritoneal cancer: Message sent to medical oncologist regarding superficial wound infection and need to delay restarting chemotherapy by 1-2 weeks.   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    Plan: Awaiting insurance approval for home wound vac. Plan to add miralax. Given nausea this am with no output from ostomy, discharge on hold at this time. Dispo:  Discharge plan to include home health. The patient is  to be discharged to home when ready.    LOS: 6 days    Jadrien Narine D Kesley Gaffey 01/07/2023, 9:00  AM

## 2023-01-07 NOTE — Progress Notes (Signed)
Mobility Specialist - Progress Note   01/07/23 1107  Mobility  Activity Ambulated independently in hallway;Ambulated independently to bathroom  Level of Assistance Independent  Assistive Device None  Distance Ambulated (ft) 1000 ft  Activity Response Tolerated well  Mobility Referral Yes  Mobility visit 1 Mobility  Mobility Specialist Start Time (ACUTE ONLY) 1047  Mobility Specialist Stop Time (ACUTE ONLY) 1104  Mobility Specialist Time Calculation (min) (ACUTE ONLY) 17 min   Pt received in bed and agreeable to mobility. No complaints during session.  Pt to recliner after session with all needs met.    [facility]

## 2023-01-08 LAB — BASIC METABOLIC PANEL
Anion gap: 9 (ref 5–15)
BUN: 13 mg/dL (ref 8–23)
CO2: 26 mmol/L (ref 22–32)
Calcium: 9.6 mg/dL (ref 8.9–10.3)
Chloride: 103 mmol/L (ref 98–111)
Creatinine, Ser: 0.5 mg/dL (ref 0.44–1.00)
GFR, Estimated: >60 mL/min (ref >60)
Glucose, Bld: 110 mg/dL — ABNORMAL HIGH (ref 70–99)
Potassium: 3.8 mmol/L (ref 3.5–5.1)
Sodium: 138 mmol/L (ref 135–145)

## 2023-01-08 LAB — CBC
HCT: 35.8 % — ABNORMAL LOW (ref 36.0–46.0)
Hemoglobin: 11.7 g/dL — ABNORMAL LOW (ref 12.0–15.0)
MCH: 31.6 pg (ref 26.0–34.0)
MCHC: 32.7 g/dL (ref 30.0–36.0)
MCV: 96.8 fL (ref 80.0–100.0)
Platelets: 299 10*3/uL (ref 150–400)
RBC: 3.7 MIL/uL — ABNORMAL LOW (ref 3.87–5.11)
RDW: 16.9 % — ABNORMAL HIGH (ref 11.5–15.5)
WBC: 6.5 10*3/uL (ref 4.0–10.5)
nRBC: 0 % (ref 0.0–0.2)

## 2023-01-08 NOTE — Progress Notes (Addendum)
GYN Oncology Progress Note  6 Days Post-Op Procedure(s) (LRB): INCISION AND DRAINAGE OF ABDOMINAL WOUND SEROMA (N/A)  Subjective: Patient reports having mild intermittent nausea that resolves without medication. She can't tell if the nausea may be related to having an empty stomach. No emesis. Reports improved output from ostomy since yesterday. Small amount of flatus out with patient reporting decrease in flatus with taking simethicone. Tolerating diet. Continues to tolerate oral antibiotics. She has been ambulating in the halls with assist. Voiding without difficulty. No pain reported.   Objective: Vital signs in last 24 hours: Temp:  [98.1 F (36.7 C)-98.9 F (37.2 C)] 98.3 F (36.8 C) (12/10 0603) Pulse Rate:  [91-100] 100 (12/10 0603) Resp:  [17-18] 18 (12/10 0603) BP: (125-137)/(64-77) 127/76 (12/10 0603) SpO2:  [93 %-97 %] 93 % (12/10 0603) Last BM Date : 01/07/23  Intake/Output from previous day: 12/09 0701 - 12/10 0700 In: 2040 [P.O.:2040] Out: 3370 [Urine:3200; Stool:170]  Physical Examination: General: Alert, oriented, no acute distress. Chest: Lungs clear. Breathing unlabored.   Cardiovascular: Regular rate and rhythm, no murmurs. Abdomen: soft, nontender.  Normoactive bowel sounds. Wound VAC in place with no surrounding erythema of the skin.  Wet-to-dry dressing in place at the upper aspect of the incision.  Ostomy appliance with minimal flatus, loose brown stool in the bag. Stoma pink and viable. Extremities: Grossly normal range of motion.  Warm, well perfused.  No edema bilaterally.  Labs: Labs from 01/06/2023  Aerobic/anaerobic culture 12/4:  Gram stain - No WBC, no organisms Culture: few enterobacter cloacae, few enterococcus faecalis, rare serratia marcescens, rare pseudomonas aeruginosa. Final with sensitivities.  Assessment: 77 y.o. 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 Started on Keflex at the time of her staple removal visit for incisional erythema concerning for early cellulitis. Given worsening cellulitis of the lower aspect of the incision at her visit on 12/2, the patient was transitioned to doxycycline and flagyl. CT A/P on 12/3 revealed a 5/1 cm superficial abscess in the subcutaneous issue below the umbilicus and a 4 cm thin elongated intra-abdominal abscess anterior to the liver that would be too small to drain percutaneously. Now s/p I&D on 12/4. Started on IV Zosyn/vanc with transition to levo/flagyl PO on 12/5. Given significant nausea, transitioned to Augmentin on 12/6, Cipro added 12/7.   Superficial surgical site infection: Excellent source control after I&D.  Patient had significant nausea with Levaquin and Flagyl, transitioned to Augmentin 12/6 which she is tolerating very well.  Cipro added on 12/7 given sensitivities. Plan to discharge with a total course of approximately 10 days.  Awaiting insurance approval for wound VAC.     GI/Nausea: Improved output from ostomy. Mild intermittent nausea, previously improved after discontinuing Levaquin and Flagyl.   Primary peritoneal cancer: Message sent to medical oncologist regarding superficial wound infection and need to delay restarting chemotherapy by 1-2 weeks.   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    Plan: Awaiting insurance approval for home wound vac. Will check labs this am for updated values. Mild tachycardia with overnight and early am vitals with HR at 100. Normal rate at this time. Will have vitals rechecked. Dispo:  Discharge plan to include home  health. The patient is to be discharged to  home when ready.    LOS: 7 days    Rachel Carlson 01/08/2023, 8:09 AM

## 2023-01-08 NOTE — Plan of Care (Signed)
  Problem: Education: Goal: Knowledge of General Education information will improve Description Including pain rating scale, medication(s)/side effects and non-pharmacologic comfort measures Outcome: Progressing   Problem: Health Behavior/Discharge Planning: Goal: Ability to manage health-related needs will improve Outcome: Progressing   

## 2023-01-08 NOTE — TOC Progression Note (Signed)
Transition of Care [facility]) - Progression Note    Patient Details  Name: Rachel Carlson MRN: [id number] Date of Birth: 10-Mar-1945  [facility]) CM/SW Contact  Darleene Cleaver, [place] Phone Number: 01/08/2023, 9:41 AM  Clinical Narrative:     CSW contacted French Ana at [facility] to get an updated on the wound vac authorization.  Per French Ana, she is still waiting for auth, and sent them a message this morning.  She will keep TOC posted on progress of wound vac.  TOC to continue to follow patient's progress throughout discharge planning.  Expected Discharge Plan: Home w Home Health Services Barriers to Discharge: Continued Medical Work up  Expected Discharge Plan and Services   Discharge Planning Services: CM Consult Post Acute Care Choice: Resumption of Svcs/PTA Provider [facility] services w/ [facility]) Living arrangements for the past 2 months: Single Family Home                           HH Arranged: RN [facility] Agency: [facility] ([facility]) Date HH Agency Contacted: 01/03/23 Time HH Agency Contacted: [phone] Representative spoke with at [facility] Agency: Adele Dan   Social Determinants of Health (SDOH) Interventions SDOH Screenings   Food Insecurity: No Food Insecurity (01/03/2023)  Housing: Low Risk  (01/03/2023)  Transportation Needs: No Transportation Needs (01/03/2023)  Utilities: Not At Risk (01/03/2023)  Alcohol Screen: Low Risk  (08/28/2022)  Depression (PHQ2-9): Low Risk  (08/28/2022)  Financial Resource Strain: Low Risk  (08/28/2022)  Physical Activity: Inactive (08/28/2022)  Social Connections: Moderately Integrated (08/28/2022)  Stress: No Stress Concern Present (08/28/2022)  Tobacco Use: Low Risk  (01/02/2023)  Health Literacy: Adequate Health Literacy (08/28/2022)    Readmission Risk Interventions    01/03/2023    2:59 PM  Readmission Risk Prevention Plan  Transportation Screening Complete  PCP or Specialist Appt within 5-7 Days Complete  Home Care Screening  Complete  Medication Review (RN CM) Complete

## 2023-01-09 MED ORDER — HEPARIN SOD (PORK) LOCK FLUSH 100 UNIT/ML IV SOLN
500.0000 [IU] | INTRAVENOUS | Status: AC | PRN
  Administered 2023-01-09: 500 [IU]

## 2023-01-09 NOTE — Care Management Important Message (Signed)
Important Message  Patient Details IM Letter given. Name: Rachel Carlson MRN: [id number] Date of Birth: 1945/08/05   Important Message Given:  Yes - Medicare IM     Caren Macadam 01/09/2023, 2:55 PM

## 2023-01-09 NOTE — TOC Transition Note (Signed)
Transition of Care [facility]) - CM/SW Discharge Note   Patient Details  Name: Rachel Carlson MRN: [id number] Date of Birth: 1945-08-01  Transition of Care [facility]) CM/SW Contact:  Lanier Clam, RN Phone Number: 01/09/2023, 11:30 AM   Clinical Narrative:Informed [employer] rep Tracey-left vm.  Noted no wound vac needed @ d/c.  [facility] rep Artavia informed of HHRN-ostomy instruction. No further CM needs.  No further CM needs.      Final next level of care: Home w Home Health Services Barriers to Discharge: No Barriers Identified   Patient Goals and CMS Choice CMS Medicare.gov Compare Post Acute Care list provided to:: Patient    Discharge Placement                         Discharge Plan and Services Additional resources added to the After Visit Summary for     Discharge Planning Services: CM Consult Post Acute Care Choice: Resumption of Svcs/PTA Provider [facility] services w/ [facility])                    HH Arranged: RN HH Agency: [facility] Date HH Agency Contacted: 01/03/23 Time HH Agency Contacted: [phone] Representative spoke with at [facility] Agency: Adele Dan  Social Determinants of Health (SDOH) Interventions SDOH Screenings   Food Insecurity: No Food Insecurity (01/03/2023)  Housing: Low Risk  (01/03/2023)  Transportation Needs: No Transportation Needs (01/03/2023)  Utilities: Not At Risk (01/03/2023)  Alcohol Screen: Low Risk  (08/28/2022)  Depression (PHQ2-9): Low Risk  (08/28/2022)  Financial Resource Strain: Low Risk  (08/28/2022)  Physical Activity: Inactive (08/28/2022)  Social Connections: Moderately Integrated (08/28/2022)  Stress: No Stress Concern Present (08/28/2022)  Tobacco Use: Low Risk  (01/02/2023)  Health Literacy: Adequate Health Literacy (08/28/2022)     Readmission Risk Interventions    01/03/2023    2:59 PM  Readmission Risk Prevention Plan  Transportation Screening Complete  PCP or Specialist Appt within 5-7 Days Complete  Home  Care Screening Complete  Medication Review (RN CM) Complete

## 2023-01-09 NOTE — Plan of Care (Signed)
  Problem: Education: Goal: Knowledge of General Education information will improve Description Including pain rating scale, medication(s)/side effects and non-pharmacologic comfort measures Outcome: Progressing   Problem: Health Behavior/Discharge Planning: Goal: Ability to manage health-related needs will improve Outcome: Progressing   

## 2023-01-09 NOTE — Discharge Summary (Signed)
Physician Discharge Summary  Patient ID: Rachel Carlson MRN: [id number] DOB/AGE: 05-Dec-1945 77 y.o.  Admit date: 01/01/2023 Discharge date: 01/09/2023  Admission Diagnoses: Postoperative infection  Discharge Diagnoses:  Principal Problem:   Postoperative infection Active Problems:   Wound infection after surgery   Discharged Condition:  The patient is in good condition and stable for discharge.    Hospital Course: 77 y.o. 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 On 12/12/2022, she underwent diagnostic laparoscopy, conversion to exploratory laparotomy with radical tumor debulking including total omentectomy, resection of peritoneal disease, small bowel resection with 3 anastomosis, en bloc resection of the left fallopian tube and ovary as well as rectosigmoid colon, left ureterolysis, right salpingo-oophorectomy, end descending colostomy, cystoscopy with Dr. Eugene Garnet. Her hospital admission was from 12/12/2022 to 12/21/2022. When presenting to the office for staple removal, mild incisional erythema concerning for early cellulitis was noted and she was started on keflex. She presented to the office for follow up and incision assessment on 12/31/2022 with worsening cellulitis of the lower aspect of the incision. A CT AP was ordered to further evaluate and antibiotics were changed to doxycycline and flagyl.    Her CT scan was performed on 01/01/2023 resulting:   1. 5.1 x 2.3 x 3.4 cm superficial abscess in the subcutaneous fat just below the umbilicus with surrounding skin thickening and cellulitis. 2. 4 cm thin elongated intra-abdominal abscess anteriorly near the liver. This appears to be too small to drain percutaneously. 3. Extensive surgical changes with small bowel anastomosis in the pelvis and a left-sided colostomy. No acute bowel findings. 4. Persistent moderate-sized left pleural effusion and small right pleural effusion with  overlying atelectasis. 5. Stable hiatal hernia. 6. No obvious residual omental disease or peritoneal implants. 7. Aortic Atherosclerosis   After scan results, the patient was admitted to the hospital for IV antibiotics (zosyn and vancomycin) and incision and drainage. She is s/p incision and drainage and wound VAC placement on 01/02/2023. Culture returned with few enterobacter cloacae, few enterococcus faecalis, rare serratia marcescens, rare pseudomonas aeruginosa. Changed to oral levaquin and flagyl. Due to significant nausea with these medications, her antibiotics were changed to Augmentin and Cipro which were better tolerated. Post-operatively, she had challenges with slowed ostomy function, nausea, decreased PO intake. At her dressing change with the wound RN on 01/09/2023, the open wound in the lower aspect of her incision had healed significantly after 7 days of wound VAC therapy and no longer required the [facility] dressing. She was discharged home today on POD 7 with home health services being resumed, tolerating her diet, tolerating oral antibiotics (Augmentin and Cipro), having bowel function with ostomy, voiding, ambulating, instructed on wet to dry dressing changes to two open areas of abdominal incision once daily.  Consults: Wound/ostomy  Significant Diagnostic Studies: Labs  Treatments: surgery: see above, IV antibiotics, IVF hydration, wound VAC application and changes  Discharge Exam (am assessment by Dr. Pricilla Holm): Blood pressure 132/77, pulse 87, temperature 98.9 F (37.2 C), temperature source Oral, resp. rate 18, height 5\' 2"  (1.575 m), weight 158 lb 15.2 oz (72.1 kg), last menstrual period 05/29/1996, SpO2 94%. General: Alert, oriented, no acute distress. Chest: Lungs clear. Breathing unlabored.   Cardiovascular: Regular rate and rhythm, no murmurs. Abdomen: soft, nontender.  Normoactive bowel sounds. Wound VAC in place with no surrounding erythema of the skin.  Wet-to-dry dressing  in place at the upper aspect of the incision, wound margins are without erythema.  Ostomy  appliance with flatus, loose brown stool in the bag. Stoma pink and viable. Extremities: Grossly normal range of motion.  Warm, well perfused.  No edema bilaterally. Update: wound VAC later removed by wound RN and wet to dry placed in lower abdominal incision. Wet to dry also present in upper aspect of abdominal incision.  Disposition: Discharge disposition: 06-Home-Health Care Svc       Discharge Instructions     Call MD for:  difficulty breathing, headache or visual disturbances   Complete by: As directed    Call MD for:  extreme fatigue   Complete by: As directed    Call MD for:  hives   Complete by: As directed    Call MD for:  persistant dizziness or light-headedness   Complete by: As directed    Call MD for:  persistant nausea and vomiting   Complete by: As directed    Call MD for:  redness, tenderness, or signs of infection (pain, swelling, redness, odor or green/yellow discharge around incision site)   Complete by: As directed    Call MD for:  severe uncontrolled pain   Complete by: As directed    Call MD for:  temperature >100.4   Complete by: As directed    De-access Port A Cath   Complete by: As directed    Diet - low sodium heart healthy   Complete by: As directed    Discharge wound care:   Complete by: As directed    Change both dressings in openings in your abdominal incisional once daily and more if soiled. You can perform a wet to dry dressing change on both areas. You can remove the dressings, shower, then replace.   Driving Restrictions   Complete by: As directed    No driving until the following have been met: Do not take narcotics and drive. You need to make sure your reaction time has returned.   Increase activity slowly   Complete by: As directed    Lifting restrictions   Complete by: As directed    No lifting greater than 10 lbs, pushing, pulling, straining for 6  weeks.      Allergies as of 01/09/2023       Reactions   Aspartame    Intolerance to artifical sweeteners, pt states caused burning of bladder   Levaquin [levofloxacin] Nausea Only   Pantoprazole Sodium Diarrhea   Abdominal pain   Ranitidine Diarrhea   abdominal pain   Omeprazole Diarrhea   Abdominal pain   Sulfasalazine Rash, Other (See Comments)   Fever, chills, headache, muscle pain also        Medication List     STOP taking these medications    doxycycline 100 MG tablet Commonly known as: VIBRA-TABS   metroNIDAZOLE 500 MG tablet Commonly known as: FLAGYL       TAKE these medications    amoxicillin-clavulanate 875-125 MG tablet Commonly known as: AUGMENTIN Take 1 tablet by mouth 2 (two) times daily for 10 days.   apixaban 5 MG Tabs tablet Commonly known as: ELIQUIS Take 1 tablet (5 mg total) by mouth 2 (two) times daily.   calcium citrate-vitamin D 315-200 MG-UNIT tablet Commonly known as: CITRACAL+D Take 1 tablet by mouth 2 (two) times daily.   CARBOPLATIN IV Inject into the vein every 21 ( twenty-one) days. port   ciprofloxacin 500 MG tablet Commonly known as: Cipro Take 1 tablet (500 mg total) by mouth 2 (two) times daily.   Co Q-10 200 MG  Caps Take 200 mg by mouth daily.   CRANBERRY PO Take 650 mg by mouth daily.   cyanocobalamin 1000 MCG tablet Commonly known as: VITAMIN B12 Take 1,000 mcg by mouth daily.   FISH OIL TRIPLE STRENGTH PO Take 1 capsule by mouth daily.   GLUCOSAMINE CHONDR COMPLEX PO Take 2 capsules by mouth daily.   GRAPE SEED EXTRACT PO Take 1 capsule by mouth daily.   ketoconazole 2 % cream Commonly known as: NIZORAL Apply 1 Application topically daily. To yeast rash What changed:  when to take this reasons to take this additional instructions   Prevacid 24HR 15 MG capsule Generic drug: lansoprazole Take 15 mg by mouth See admin instructions. Take 15 mg by mouth in the morning before any food and an  additional 15 mg once a day as needed for unresolved reflux   lansoprazole 15 MG capsule Commonly known as: PREVACID Take 1 capsule (15 mg total) by mouth 2 (two) times daily before a meal.   lidocaine-prilocaine cream Commonly known as: EMLA Apply to affected area once What changed:  how much to take how to take this when to take this reasons to take this additional instructions   milk thistle 175 MG tablet Take 175 mg by mouth daily.   mirtazapine 15 MG tablet Commonly known as: REMERON Take 1 tablet (15 mg total) by mouth at bedtime.   ondansetron 8 MG tablet Commonly known as: Zofran Take 1 tablet (8 mg total) by mouth every 8 (eight) hours as needed for nausea or vomiting. Start on the third day after carboplatin.   PACLITAXEL IV Inject into the vein every 21 ( twenty-one) days. port   phenazopyridine 200 MG tablet Commonly known as: Pyridium Take 1 tablet (200 mg total) by mouth 3 (three) times daily as needed for pain.   prochlorperazine 10 MG tablet Commonly known as: COMPAZINE Take 10 mg by mouth every 6 (six) hours as needed for nausea or vomiting.   rosuvastatin 20 MG tablet Commonly known as: CRESTOR Take 1 tablet (20 mg total) by mouth at bedtime.   simethicone 80 MG chewable tablet Commonly known as: MYLICON Chew 1 tablet (80 mg total) by mouth 4 (four) times daily as needed for flatulence.   SYSTANE OP Place 1 drop into both eyes 4 (four) times daily as needed (for dryness).   traMADol 50 MG tablet Commonly known as: ULTRAM Take 1 tablet (50 mg total) by mouth every 6 (six) hours as needed for severe pain (pain score 7-10). For AFTER surgery only, do not take and drive   traZODone 50 MG tablet Commonly known as: DESYREL Take 1 tablet (50 mg total) by mouth at bedtime as needed for sleep.   vitamin C 1000 MG tablet Take 1,000 mg by mouth daily.   Vitamin D-3 125 MCG (5000 UT) Tabs Take 5,000 Units by mouth daily.   zinc gluconate 50 MG  tablet Take 50 mg by mouth daily.               Discharge Care Instructions  (From admission, onward)           Start     Ordered   01/09/23 0000  Discharge wound care:       Comments: Change both dressings in openings in your abdominal incisional once daily and more if soiled. You can perform a wet to dry dressing change on both areas. You can remove the dressings, shower, then replace.   01/09/23 1116  Follow-up Information     Warner Mccreedy D, NP Follow up on 01/15/2023.   Specialty: Gynecologic Oncology Why: at 3pm at the [facility] information: [address] [place] [address] [phone]         Doreatha Massed, MD Follow up on 01/17/2023.   Specialty: Hematology Contact information: [address] [place] [address] [phone]         [facility] Follow up.   Why: HH nursing Contact information: [address] [place] [address] [phone]                 Greater than thirty minutes were spend for face to face discharge instructions and discharge orders/summary in EPIC.   Signed: Doylene Bode 01/10/2023, 10:01 AM

## 2023-01-09 NOTE — Consult Note (Signed)
WOC Nurse wound follow up Wound type: surgical Measurement: 3cm x 2cm x 2cm  Wound bed:100% clean, early granulation tissue Drainage (amount, consistency, odor) scant, serosanguinous  Periwound: intact  Dressing procedure/placement/frequency: Removed old NPWT dressing (1) pc black foam Taught patient to replace with saline moist 2x2 (2), packing, topped with 2 4x4 gauze folded in half and secured with tape. Patient verbalized comfort with this. Supplies gathered for DC to home.   WOC Nurse ostomy follow up Stoma type/location: LLQ, colostomy Stomal assessment/size: slightly oval, 1 3/8", flush Peristomal assessment: intact, sutures still present Treatment options for stomal/peristomal skin: 2" barrier ring Output pasty brown stool  Ostomy pouching: 1pc.soft convex with 2" barrier ring  Education provided:  Patient cut new skin barrier Cleansed skin, applied new barrier ring Apply new pouch, independent with lock and roll closure.  Enrolled patient in [place] Secure Start Discharge program: Yes, previously, new samples sent today  Catalog marked with items she is using, written materials provided.  Patient to include in DC instructions release to shower and drive.    Notified bedside nursing, [facility] staff, MD above.   Lian Pounds [facility], CNS, [employer] [phone]

## 2023-01-09 NOTE — Progress Notes (Signed)
GYN Oncology Progress Note  7 Days Post-Op Procedure(s) (LRB): INCISION AND DRAINAGE OF ABDOMINAL WOUND SEROMA (N/A)  Subjective: Patient reports doing better. Had moderate output from the ostomy last pm. Nausea has improved, is mild, intermittent, and manageable. Relates feeling to having an "empty stomach." Continuing to void without difficulty. Ambulating in the room and in the halls with assist. She is tolerating her diet and eating small amounts. No increase in appetite at this time.   Objective: Vital signs in last 24 hours: Temp:  [97.5 F (36.4 C)-98.9 F (37.2 C)] 98.9 F (37.2 C) (12/11 0559) Pulse Rate:  [87-110] 87 (12/11 0559) Resp:  [16-18] 18 (12/11 0559) BP: (119-150)/(67-78) 132/77 (12/11 0559) SpO2:  [94 %-98 %] 94 % (12/11 0559) Last BM Date : 01/09/23  Intake/Output from previous day: 12/10 0701 - 12/11 0700 In: 1570 [P.O.:1560; I.V.:10] Out: 3200 [Urine:1750; Stool:1450]  Physical Examination: General: Alert, oriented, no acute distress. Chest: Lungs clear. Breathing unlabored.   Cardiovascular: Regular rate and rhythm, no murmurs. Abdomen: soft, nontender.  Normoactive bowel sounds. Wound VAC in place with no surrounding erythema of the skin.  Wet-to-dry dressing in place at the upper aspect of the incision, wound margins are without erythema.  Ostomy appliance with flatus, loose brown stool in the bag. Stoma pink and viable. Extremities: Grossly normal range of motion.  Warm, well perfused.  No edema bilaterally.  Labs: Labs from 01/06/2023  Aerobic/anaerobic culture 12/4:  Gram stain - No WBC, no organisms Culture: few enterobacter cloacae, few enterococcus faecalis, rare serratia marcescens, rare pseudomonas aeruginosa. Final with sensitivities.  Assessment: 77 y.o. 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 Started on Keflex at the time of her staple removal visit for incisional erythema concerning for early cellulitis. Given worsening cellulitis of the lower aspect of the incision at her visit on 12/2, the patient was transitioned to doxycycline and flagyl. CT A/P on 12/3 revealed a 5/1 cm superficial abscess in the subcutaneous issue below the umbilicus and a 4 cm thin elongated intra-abdominal abscess anterior to the liver that would be too small to drain percutaneously. Now s/p I&D on 12/4. Started on IV Zosyn/vanc with transition to levo/flagyl PO on 12/5. Given significant nausea, transitioned to Augmentin on 12/6, Cipro added 12/7.   Superficial surgical site infection: Excellent source control after I&D.  Patient had significant nausea with Levaquin and Flagyl, transitioned to Augmentin 12/6 which she is tolerating very well.  Cipro added on 12/7 given sensitivities. Plan to discharge with a total course of approximately 10 days. Plan for wound RN eval this am. If lower abdominal wound is shallow and no longer needing VAC placement, will plan for discharge home possibly with wet to dry dressing changes. Still awaiting insurance approval for wound VAC.     GI/Nausea: Improved output from ostomy. Mild intermittent nausea improving, previously improved after discontinuing Levaquin and Flagyl.   Primary peritoneal cancer: Appt made for 12/19 for patient to see her medical oncologist and resume chemotherapy.   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    Plan: Awaiting insurance approval for home wound vac. Tachycardia improving Wound RN eval this am and dressing change. If lower incision is  shallow and no longer needing VAC placement, will change plan for  discharge home with once daily wet to dry dressing changes. Dispo:  Discharge plan to include home health. The patient is to be discharged to home when ready.    LOS: 8 days    Rachel Carlson 01/09/2023, 8:29 AM

## 2023-01-09 NOTE — Progress Notes (Signed)
Mobility Specialist - Progress Note   01/09/23 0856  Mobility  Activity Ambulated independently in hallway;Ambulated independently to bathroom  Level of Assistance Independent  Assistive Device None  Distance Ambulated (ft) 1000 ft  Activity Response Tolerated well  Mobility Referral Yes  Mobility visit 1 Mobility  Mobility Specialist Start Time (ACUTE ONLY) 0840  Mobility Specialist Stop Time (ACUTE ONLY) 0856  Mobility Specialist Time Calculation (min) (ACUTE ONLY) 16 min   Pt received in bed and agreeable to mobility. No complaints during session. Pt to recliner after session with all needs met.    [facility]

## 2023-01-10 ENCOUNTER — Inpatient Hospital Stay: Payer: Medicare Other

## 2023-01-10 ENCOUNTER — Encounter: Payer: Self-pay | Admitting: Gynecologic Oncology

## 2023-01-10 ENCOUNTER — Telehealth: Payer: Self-pay | Admitting: *Deleted

## 2023-01-10 ENCOUNTER — Telehealth: Payer: Self-pay

## 2023-01-10 ENCOUNTER — Inpatient Hospital Stay: Payer: Medicare Other | Admitting: Hematology

## 2023-01-10 ENCOUNTER — Other Ambulatory Visit: Payer: Self-pay | Admitting: Gynecologic Oncology

## 2023-01-10 DIAGNOSIS — C800 Disseminated malignant neoplasm, unspecified: Secondary | ICD-10-CM

## 2023-01-10 DIAGNOSIS — K9419 Other complications of enterostomy: Secondary | ICD-10-CM

## 2023-01-10 NOTE — Patient Instructions (Signed)
Visit Information  Thank you for taking time to visit with me today. Please don't hesitate to contact me if I can be of assistance to you before our next scheduled telephone appointment.  Your next appointment is by telephone on 01/16/23 at 10am   Following is a copy of your care plan:   Goals Addressed             This Visit's Progress    TOC Care Plan       Current Barriers:  Chronic Disease Management support and education needs related to post operative wound care    RNCM Clinical Goal(s):  Patient will verbalize understanding of plan for management of current Chemotherapy, treatments. Post op wound care  as evidenced by no missed Infusions, wound care completed daily as ordered  take all medications exactly as prescribed and will call provider for medication related questions as evidenced by no missed medications and no unmanaged side effects  attend all scheduled medical appointments: wit Oncology, Hematology, PCP and Surgeon  as evidenced by no missed appointment sand cancelled appointments will be rescheduled if indicated  continue to work with RN Care Manager to address care management and care coordination needs related to  post-op wound infection  as evidenced by adherence to CM Team Scheduled appointments through collaboration with RN Care manager, provider, and care team.   Interventions: Evaluation of current treatment plan related to  self management and patient's adherence to plan as established by provider  Transitions of Care:  New goal. Doctor Visits  - discussed the importance of doctor visits Reviewed Signs and symptoms of infection Reviewed wound care orders and [facility]   Patient Goals/Self-Care Activities: Participate in Transition of Care Program/Attend TOC scheduled calls Take all medications as prescribed Attend all scheduled provider appointments Call pharmacy for medication refills 3-7 days in advance of running out of medications Perform all self care  activities independently  Perform IADL's (shopping, preparing meals, housekeeping, managing finances) independently Call provider office for new concerns or questions   Follow Up Plan:  Telephone follow up appointment with care management team member scheduled for:  01/16/23 @ 10 am  with [facility]  The patient has been provided with contact information for the care management team and has been advised to call with any health related questions or concerns.         The patient verbalized understanding of instructions, educational materials, and care plan provided today      Please call the care guide team at [phone] if you need to cancel or reschedule your appointment.   Please call the [place] National Suicide Prevention Lifeline: 657-196-9872 or TTY: 5641372884 TTY [phone]) to talk to a trained counselor if you are experiencing a Mental Health or Behavioral Health Crisis or need someone to talk to.  Susa Loffler , BSN, RN Care Management Coordinator [place]   [facility] [email or web address] Direct Dial: [phone]

## 2023-01-10 NOTE — Telephone Encounter (Signed)
Spoke with Ms. Hageman this afternoon. Pt states she is feeling better and her appetite is slowly improving. Pt denies fever and or chills. Pt is changing wound dressing wet to dry and abdominal wound has no drainage, pink tissue present without swelling or tenderness. Pt would like to start driving again, she is not taking any narcotics.   Relayed message from Warner Mccreedy, NP that patient can drive as tolerated.   Pt also states she has not listened to messages on her answering machine yet, so is not sure if Home Health nurses have tried to call. Advised patient to listen to her messages and let us know if home health hasn't reach out for ostomy care. Pt was also given the number to [facility] # [phone]. - a referral has been sent and patient was receptive to this information for Ostomy care and needs. Pt thanked the office for calling and reminded patient to call with any concerns or questions.

## 2023-01-10 NOTE — Telephone Encounter (Signed)
Attempted to reach patient to check on post op status. Unable to leave a message due to mailbox being full.

## 2023-01-10 NOTE — Transitions of Care (Post Inpatient/ED Visit) (Signed)
01/10/2023  Name: Rachel Carlson MRN: [id number] DOB: 11/14/1945  Today's TOC FU Call Status: TOC FU Call Complete Date: 01/10/23 Patient's Name and Date of Birth confirmed.  Transition Care Management Follow-up Telephone Call Date of Discharge: 01/09/23 Discharge Facility: [facility]) Type of Discharge: Inpatient Admission Primary Inpatient Discharge Diagnosis:: Postoperative infection How have you been since you were released from the hospital?: Better Any questions or concerns?: No  Items Reviewed: Did you receive and understand the discharge instructions provided?: Yes Medications obtained,verified, and reconciled?: Yes (Medications Reviewed) Any new allergies since your discharge?: No (sensitivity to Flagyl and Levaquin) Dietary orders reviewed?: Yes Type of Diet Ordered:: Reg NAS Heart Healthy Do you have support at home?: No (Spouse Mosetta Putt is handicapped  and son Caryn Bee works day/ night) People in Home: spouse, child(ren), adult  Medications Reviewed Today: Medications Reviewed Today     Reviewed by Johnnette Barrios, RN (Registered Nurse) on 01/10/23 at 1030  Med List Status: <None>   Medication Order Taking? Sig Documenting Provider Last Dose Status Informant  amoxicillin-clavulanate (AUGMENTIN) 875-125 MG tablet [id number] Yes Take 1 tablet by mouth 2 (two) times daily for 10 days. Carver Fila, MD Taking Active   apixaban [facility] 5 MG TABS tablet [id number] Yes Take 1 tablet (5 mg total) by mouth 2 (two) times daily. Tower, Audrie Gallus, MD Taking Active Self  Ascorbic Acid (VITAMIN C) 1000 MG tablet [id number] No Take 1,000 mg by mouth daily.  Patient not taking: Reported on 01/01/2023   [provider] Not Taking Active Self           Med Note Floria Raveling, [place] A   Tue Dec 25, 2022  3:31 PM)    botulinum toxin Type A (BOTOX) injection 200 Units [id number]   Wyatt Haste T, MD  Active   calcium citrate-vitamin D (CITRACAL+D) 315-200 MG-UNIT per tablet  [id number]  Take 1 tablet by mouth 2 (two) times daily.  Patient not taking: Reported on 01/01/2023   [provider]  Active Self  CARBOPLATIN IV [id number] No Inject into the vein every 21 ( twenty-one) days. port  Patient not taking: Reported on 01/10/2023   [provider] Not Taking Active Self  Cholecalciferol (VITAMIN D-3) 5000 UNITS TABS [id number] No Take 5,000 Units by mouth daily.  Patient not taking: Reported on 01/01/2023   [provider] Not Taking Active Self           Med Note Antony Madura, Arn Medal   Tue Jan 01, 2023  6:54 PM)    ciprofloxacin (CIPRO) 500 MG tablet [id number] Yes Take 1 tablet (500 mg total) by mouth 2 (two) times daily. Warner Mccreedy D, NP Taking Active   Coenzyme Q10 (CO Q-10) 200 MG CAPS [id number] No Take 200 mg by mouth daily.  Patient not taking: Reported on 01/01/2023   [provider] Not Taking Active Self           Med Note Floria Raveling, [place] A   Tue Dec 25, 2022  3:31 PM)    CRANBERRY PO [id number] No Take 650 mg by mouth daily.  Patient not taking: Reported on 01/01/2023   [provider] Not Taking Active Self           Med Note Floria Raveling, [place] A   Tue Dec 25, 2022  3:31 PM)    Glucosamine-Chondroitin (GLUCOSAMINE CHONDR COMPLEX PO) [id number] No Take 2 capsules by mouth daily.   Patient not taking: Reported on 01/01/2023  [provider] Not Taking Active Self           Med Note Floria Raveling, [place] A   Tue Dec 25, 2022  3:31 PM)    GRAPE SEED EXTRACT PO [id number] No Take 1 capsule by mouth daily.  Patient not taking: Reported on 01/01/2023   [provider] Not Taking Active Self           Med Note Floria Raveling, [place] A   Tue Dec 25, 2022  3:31 PM)    ketoconazole (NIZORAL) 2 % cream [id number] Yes Apply 1 Application topically daily. To yeast rash Tower, Audrie Gallus, MD Taking Active Self  lansoprazole (PREVACID) 15 MG capsule [id number] Yes Take 1 capsule (15 mg total) by mouth 2 (two) times daily before a  meal. Iva Boop, MD Taking Active Self           Med Note Tawanna Cooler, TONI V   Tue May 15, 2022 10:42 AM)    lidocaine-prilocaine (EMLA) cream [id number] Yes Apply to affected area once  Patient taking differently: Apply 1 Application topically daily as needed (prior to port access).   Doreatha Massed, MD Taking Active Self  milk thistle 175 MG tablet [id number] No Take 175 mg by mouth daily.  Patient not taking: Reported on 01/01/2023   [provider] Not Taking Active Self           Med Note Floria Raveling, [place] A   Tue Dec 25, 2022  3:31 PM)    mirtazapine (REMERON) 15 MG tablet [id number] No Take 1 tablet (15 mg total) by mouth at bedtime.  Patient not taking: Reported on 01/10/2023   Carver Fila, MD Not Taking Active   Omega-3 Fatty Acids (FISH OIL TRIPLE STRENGTH PO) [id number] No Take 1 capsule by mouth daily.  Patient not taking: Reported on 01/01/2023   [provider] Not Taking Active Self           Med Note Floria Raveling, [place] A   Tue Dec 25, 2022  3:31 PM)    ondansetron (ZOFRAN) 8 MG tablet [id number] No Take 1 tablet (8 mg total) by mouth every 8 (eight) hours as needed for nausea or vomiting. Start on the third day after carboplatin.  Patient not taking: Reported on 01/10/2023   Doreatha Massed, MD Not Taking Active Self  PACLITAXEL IV [id number] Yes Inject into the vein every 21 ( twenty-one) days. port [provider] Taking Active Self  phenazopyridine (PYRIDIUM) 200 MG tablet [id number] No Take 1 tablet (200 mg total) by mouth 3 (three) times daily as needed for pain.  Patient not taking: Reported on 01/01/2023   Loleta Chance, MD Not Taking Active Self  Polyethyl Glycol-Propyl Glycol [facility] OP) [id number] Yes Place 1 drop into both eyes 4 (four) times daily as needed (for dryness). [provider] Taking Active Self  PREVACID 24HR 15 MG capsule [id number] Yes Take 15 mg by mouth See admin instructions. Take 15 mg by mouth in the  morning before any food and an additional 15 mg once a day as needed for unresolved reflux [provider] Taking Active Self  prochlorperazine (COMPAZINE) 10 MG tablet [id number] Yes Take 10 mg by mouth every 6 (six) hours as needed for nausea or vomiting. [provider] Taking Active Self  rosuvastatin (CRESTOR) 20 MG tablet [id number] Yes Take 1 tablet (20 mg total) by mouth at bedtime. Tower, Audrie Gallus, MD Taking Active Self  simethicone (MYLICON) 80 MG chewable tablet [id number] Yes  Chew 1 tablet (80 mg total) by mouth 4 (four) times daily as needed for flatulence. Carver Fila, MD Taking Active   traMADol Janean Sark) 50 MG tablet [id number] Yes Take 1 tablet (50 mg total) by mouth every 6 (six) hours as needed for severe pain (pain score 7-10). For AFTER surgery only, do not take and drive Cross, Melissa D, NP Taking Active Self  traZODone (DESYREL) 50 MG tablet [id number] Yes Take 1 tablet (50 mg total) by mouth at bedtime as needed for sleep. Doreatha Massed, MD Taking Active Self  vitamin B-12 (CYANOCOBALAMIN) 1000 MCG tablet [id number] No Take 1,000 mcg by mouth daily.  Patient not taking: Reported on 01/01/2023   [provider] Not Taking Active Self           Med Note Floria Raveling, [place] A   Tue Dec 25, 2022  3:31 PM)    zinc gluconate 50 MG tablet [id number] No Take 50 mg by mouth daily.  Patient not taking: Reported on 01/01/2023   [provider] Not Taking Active Self           Med Note Floria Raveling, [place] A   Tue Dec 25, 2022  3:31 PM)              Home Care and Equipment/Supplies: Were Home Health Services Ordered?: Yes Name of Home Health Agency:: [facility] [phone] Has Agency set up a time to come to your home?: Yes First Home Health Visit Date: 01/11/23 Any new equipment or medical supplies ordered?: No  Functional Questionnaire: Do you need assistance with bathing/showering or dressing?: No Do you need assistance with meal  preparation?: No Do you need assistance with eating?: No Do you have difficulty maintaining continence: No Do you need assistance with getting out of bed/getting out of a chair/moving?: No Do you have difficulty managing or taking your medications?: No  Follow up appointments reviewed: PCP Follow-up appointment confirmed?: No (patient will call PCP) MD Provider Line Number:[phone] Given: No Follow-up Provider: Roxy Manns- patient will call Specialist Hospital Follow-up appointment confirmed?: Yes Date of Specialist follow-up appointment?: 01/15/23 Follow-Up Specialty Provider:: Oncology 12/17, Hematology 12/19 Do you need transportation to your follow-up appointment?: No (son will transport her , she usually is able to drive) Do you understand care options if your condition(s) worsen?: Yes-patient verbalized understanding  SDOH Interventions Today    Flowsheet Row Most Recent Value  SDOH Interventions   Food Insecurity Interventions Intervention Not Indicated  Housing Interventions Intervention Not Indicated  Transportation Interventions Intervention Not Indicated, Patient Resources (Friends/Family)  Utilities Interventions Intervention Not Indicated       Goals Addressed             This Visit's Progress    TOC Care Plan       Current Barriers:  Chronic Disease Management support and education needs related to post operative wound care    RNCM Clinical Goal(s):  Patient will verbalize understanding of plan for management of current Chemotherapy, treatments. Post op wound care  as evidenced by no missed Infusions, wound care completed daily as ordered  take all medications exactly as prescribed and will call provider for medication related questions as evidenced by no missed medications and no unmanaged side effects  attend all scheduled medical appointments: wit Oncology, Hematology, PCP and Surgeon  as evidenced by no missed appointment sand cancelled appointments  will be rescheduled if indicated  continue to work with RN Care Manager to address care management and care coordination needs  related to  post-op wound infection  as evidenced by adherence to CM Team Scheduled appointments through collaboration with RN Care manager, provider, and care team.   Interventions: Evaluation of current treatment plan related to  self management and patient's adherence to plan as established by provider  Transitions of Care:  New goal. Doctor Visits  - discussed the importance of doctor visits Reviewed Signs and symptoms of infection Reviewed wound care orders and [facility]   Patient Goals/Self-Care Activities: Participate in Transition of Care Program/Attend TOC scheduled calls Take all medications as prescribed Attend all scheduled provider appointments Call pharmacy for medication refills 3-7 days in advance of running out of medications Perform all self care activities independently  Perform IADL's (shopping, preparing meals, housekeeping, managing finances) independently Call provider office for new concerns or questions   Follow Up Plan:  Telephone follow up appointment with care management team member scheduled for:  01/16/23 @ 10 am  with [facility]  The patient has been provided with contact information for the care management team and has been advised to call with any health related questions or concerns.          Patient is at high risk for readmission and/or has history of  high utilization  Discussed VBCI  TOC program and weekly calls to patient to assess condition/status, medication management  and provide support/education as indicated . Patient/ Caregiver voiced understanding and is  agreeable to 30 day program     Susa Loffler , Scientist, research (physical sciences), RN Care Management Coordinator [place]   [facility] [email or web address] Direct Dial: [phone]

## 2023-01-11 ENCOUNTER — Ambulatory Visit: Payer: Medicare Other | Admitting: Gynecologic Oncology

## 2023-01-11 ENCOUNTER — Encounter: Payer: Self-pay | Admitting: Hematology

## 2023-01-11 DIAGNOSIS — K682 Retroperitoneal fibrosis: Secondary | ICD-10-CM | POA: Diagnosis not present

## 2023-01-11 DIAGNOSIS — Z9049 Acquired absence of other specified parts of digestive tract: Secondary | ICD-10-CM | POA: Diagnosis not present

## 2023-01-11 DIAGNOSIS — Z7901 Long term (current) use of anticoagulants: Secondary | ICD-10-CM | POA: Diagnosis not present

## 2023-01-11 DIAGNOSIS — Z452 Encounter for adjustment and management of vascular access device: Secondary | ICD-10-CM | POA: Diagnosis not present

## 2023-01-11 DIAGNOSIS — Z483 Aftercare following surgery for neoplasm: Secondary | ICD-10-CM | POA: Diagnosis not present

## 2023-01-11 DIAGNOSIS — Z433 Encounter for attention to colostomy: Secondary | ICD-10-CM | POA: Diagnosis not present

## 2023-01-14 ENCOUNTER — Ambulatory Visit (HOSPITAL_COMMUNITY)
Admission: RE | Admit: 2023-01-14 | Discharge: 2023-01-14 | Disposition: A | Discharge status: Home / Self Care | Payer: Medicare Other | Source: Ambulatory Visit | Attending: Nurse Practitioner | Admitting: Nurse Practitioner

## 2023-01-14 DIAGNOSIS — L24B3 Irritant contact dermatitis related to fecal or urinary stoma or fistula: Secondary | ICD-10-CM

## 2023-01-14 DIAGNOSIS — R5383 Other fatigue: Secondary | ICD-10-CM | POA: Insufficient documentation

## 2023-01-14 DIAGNOSIS — Y833 Surgical operation with formation of external stoma as the cause of abnormal reaction of the patient, or of later complication, without mention of misadventure at the time of the procedure: Secondary | ICD-10-CM | POA: Insufficient documentation

## 2023-01-14 DIAGNOSIS — C480 Malignant neoplasm of retroperitoneum: Secondary | ICD-10-CM | POA: Insufficient documentation

## 2023-01-14 DIAGNOSIS — Z433 Encounter for attention to colostomy: Secondary | ICD-10-CM | POA: Insufficient documentation

## 2023-01-14 DIAGNOSIS — T8131XA Disruption of external operation (surgical) wound, not elsewhere classified, initial encounter: Secondary | ICD-10-CM | POA: Insufficient documentation

## 2023-01-14 NOTE — Progress Notes (Signed)
[facility]   Reason for visit:  LUQ colostomy HPI:  Peritoneal carcinoma with resection and colostomy Past Medical History:  Diagnosis Date  . Blood transfusion without reported diagnosis   . Chronic right ear pain    normal MRI  . DVT of leg (deep venous thrombosis) (HCC)   . Esophageal stricture   . Family history of breast cancer   . Family history of colon cancer   . Family history of melanoma   . Family history of prostate cancer   . Fibroids 1998   uterine, history of (left ovaries)  . GERD (gastroesophageal reflux disease)   . History of hiatal hernia   . History of uterine prolapse   . Hx of adenomatous polyp of colon 10/19/2014  . Hyperlipidemia   . Hypertension    borderline no meds diet and exercise  . 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 Obesity   . Ovarian cancer (HCC)   . PVC (premature ventricular contraction)    first dx by holter in 1980's, echo (6/10) EF 55-60%, normal diastolic fxn, normal size RV and fxn, mild MR, PASP  . Squamous cell carcinoma of arm    ovarian  . Tubal pregnancy   . Ulcerative proctosigmoiditis (HCC)   . Urge urinary incontinence 06/03/2007   Qualifier: Diagnosis of  By: Lindwood Qua CMA, Jerl Santos     Family History  Problem Relation Age of Onset  . Sudden death Father 37       "coronary arteriosclerosis" on death certificate  . Heart attack Mother 96  . Transient ischemic attack Mother   . Diabetes type II Mother   . Sudden death Mother        died age 23  . Diabetes Mother   . Coronary artery disease Mother   . Skin cancer Mother   . Barrett's esophagus Mother   . Arrhythmia Brother   . Colon cancer Brother 56  . Breast cancer Sister        dx in her 68s  . Colon polyps Sister        adenomatous  . Colon cancer Son 53       lynch syndrome  . Melanoma Maternal Uncle   . Congestive Heart Failure Maternal Grandmother   . Prostate cancer Maternal Grandfather        dx in 69s   . Tuberculosis Paternal Grandfather   . Skin cancer Maternal Aunt   . Barrett's esophagus Maternal Aunt   . Other Son        parotid gland tumor  . Ehlers-Danlos syndrome Daughter   . Colon cancer Cousin        mid 66s; maternal cousin  . Colon cancer Other        MGMs brother  . Esophageal cancer Neg Hx   . Stomach cancer Neg Hx   . Rectal cancer Neg Hx    Allergies  Allergen Reactions  . Aspartame     Intolerance to artifical sweeteners, pt states caused burning of bladder  . Levaquin [Levofloxacin] Nausea Only  . Pantoprazole Sodium Diarrhea    Abdominal pain  . Ranitidine Diarrhea    abdominal pain  . Omeprazole Diarrhea    Abdominal pain  . Sulfasalazine Rash and Other (See Comments)    Fever, chills, headache, muscle pain also   Current Outpatient Medications  Medication Sig Dispense Refill Last Dose/Taking  . apixaban (ELIQUIS) 5 MG TABS tablet Take 1 tablet (  5 mg total) by mouth 2 (two) times daily. 180 tablet 0   . Ascorbic Acid (VITAMIN C) 1000 MG tablet Take 1,000 mg by mouth daily.     . calcium citrate-vitamin D (CITRACAL+D) 315-200 MG-UNIT per tablet Take 1 tablet by mouth 2 (two) times daily.     Marland Kitchen CARBOPLATIN IV Inject into the vein every 21 ( twenty-one) days. port     . Cholecalciferol (VITAMIN D-3) 5000 UNITS TABS Take 5,000 Units by mouth daily.     . ciprofloxacin (CIPRO) 500 MG tablet Take 1 tablet (500 mg total) by mouth 2 (two) times daily. 20 tablet 0   . Coenzyme Q10 (CO Q-10) 200 MG CAPS Take 200 mg by mouth daily.     Marland Kitchen CRANBERRY PO Take 650 mg by mouth daily.     . Glucosamine-Chondroitin (GLUCOSAMINE CHONDR COMPLEX PO) Take 2 capsules by mouth daily.     Marland Kitchen GRAPE SEED EXTRACT PO Take 1 capsule by mouth daily.     Marland Kitchen ketoconazole (NIZORAL) 2 % cream Apply 1 Application topically daily. To yeast rash 30 g 1   . lansoprazole (PREVACID) 15 MG capsule Take 1 capsule (15 mg total) by mouth 2 (two) times daily before a meal.     . lidocaine-prilocaine  (EMLA) cream Apply to affected area once (Patient taking differently: Apply 1 Application topically daily as needed (prior to port access).) 30 g 3   . milk thistle 175 MG tablet Take 175 mg by mouth daily.     . mirtazapine (REMERON) 15 MG tablet Take 1 tablet (15 mg total) by mouth at bedtime. 30 tablet 4   . Omega-3 Fatty Acids (FISH OIL TRIPLE STRENGTH PO) Take 1 capsule by mouth daily.     . ondansetron (ZOFRAN) 8 MG tablet Take 1 tablet (8 mg total) by mouth every 8 (eight) hours as needed for nausea or vomiting. Start on the third day after carboplatin. 30 tablet 1   . PACLITAXEL IV Inject into the vein every 21 ( twenty-one) days. port     . phenazopyridine (PYRIDIUM) 200 MG tablet Take 1 tablet (200 mg total) by mouth 3 (three) times daily as needed for pain. 10 tablet 0   . Polyethyl Glycol-Propyl Glycol (SYSTANE OP) Place 1 drop into both eyes 4 (four) times daily as needed (for dryness).     Marland Kitchen PREVACID 24HR 15 MG capsule Take 15 mg by mouth See admin instructions. Take 15 mg by mouth in the morning before any food and an additional 15 mg once a day as needed for unresolved reflux     . prochlorperazine (COMPAZINE) 10 MG tablet Take 10 mg by mouth every 6 (six) hours as needed for nausea or vomiting.     . rosuvastatin (CRESTOR) 20 MG tablet Take 1 tablet (20 mg total) by mouth at bedtime. 90 tablet 3   . simethicone (MYLICON) 80 MG chewable tablet Chew 1 tablet (80 mg total) by mouth 4 (four) times daily as needed for flatulence. 30 tablet 4   . traMADol (ULTRAM) 50 MG tablet Take 1 tablet (50 mg total) by mouth every 6 (six) hours as needed for severe pain (pain score 7-10). For AFTER surgery only, do not take and drive 15 tablet 0   . traZODone (DESYREL) 50 MG tablet Take 1 tablet (50 mg total) by mouth at bedtime as needed for sleep. 30 tablet 1   . vitamin B-12 (CYANOCOBALAMIN) 1000 MCG tablet Take 1,000 mcg by mouth daily.     Marland Kitchen  zinc gluconate 50 MG tablet Take 50 mg by mouth daily.       Current Facility-Administered Medications  Medication Dose Route Frequency Provider Last Rate Last Admin  . botulinum toxin Type A (BOTOX) injection 200 Units  200 Units Intramuscular Once        ROS  Review of Systems  Constitutional:  Positive for fatigue.  Gastrointestinal:        LUQ colostomy   Skin:  Positive for wound.       Midline wound dehiscence at proximal and distal end.   Psychiatric/Behavioral: Negative.    All other systems reviewed and are negative.  Vital signs:  LMP 05/29/1996  Exam:  Physical Exam Constitutional:      Appearance: Normal appearance.  Cardiovascular:     Rate and Rhythm: Normal rate and regular rhythm.  Abdominal:     Palpations: Abdomen is soft.  Skin:    General: Skin is warm and dry.  Neurological:     Mental Status: She is alert and oriented to person, place, and time.  Psychiatric:        Mood and Affect: Mood normal.        Behavior: Behavior normal.     Comments: Feeling more comfortable with ostomy care    Stoma type/location:  LUQ colostomy, slightly budded Stomal assessment/size:  1 1/2" pink moist and producing soft brown stool Peristomal assessment:  midline wound dehisced in proximal and distal ends. Healing well with NS moist gauze twice daily.  WOund care performed and photo in chart Treatment options for stomal/peristomal skin: barrier ring  skin prep to peristomal skin and 1 piece convex pouch Output: soft brown stool Ostomy pouching: 1pc.convex  Education provided:  HH remains in place and they will provide pouches.  Once discharged, patient will be set up with [facility] for supplies. I will send prescription to establish account and be ready when [facility] is discharged.      Impression/dx  Colostomy Surgical wound  Discussion  See above Set up for supplies. Patient is gaining confidence in ostomy care.  Plan  Set up with [facility], to begin when [facility] is out.     Visit time: 55 minutes.   Mike Gip FNP-BC

## 2023-01-14 NOTE — Discharge Instructions (Signed)
1 piece convex Barrier ring Skin prep

## 2023-01-15 ENCOUNTER — Inpatient Hospital Stay (HOSPITAL_BASED_OUTPATIENT_CLINIC_OR_DEPARTMENT_OTHER): Payer: Medicare Other | Admitting: Gynecologic Oncology

## 2023-01-15 VITALS — BP 120/77 | HR 94 | Temp 98.5°F | Resp 18 | Wt 159.0 lb

## 2023-01-15 DIAGNOSIS — C482 Malignant neoplasm of peritoneum, unspecified: Secondary | ICD-10-CM

## 2023-01-15 DIAGNOSIS — T148XXA Other injury of unspecified body region, initial encounter: Secondary | ICD-10-CM

## 2023-01-15 DIAGNOSIS — Z9889 Other specified postprocedural states: Secondary | ICD-10-CM

## 2023-01-15 NOTE — Progress Notes (Unsigned)
GYN Oncology Post-operative Follow Up  01/15/2023  Treatment History:    Oncology History  Carcinomatosis (HCC)  08/31/2022 Initial Diagnosis    Carcinomatosis (HCC)    09/12/2022 -  Chemotherapy    Patient is on Treatment Plan : OVARIAN Carboplatin (AUC 6) + Paclitaxel (175) q21d X 6 Cycles       The patient initially presented to the emergency department on 7/8 with a couple months of worsening abdominal distention.  She had previously had outpatient ultrasound which showed mild ascites, further workup was negative for cirrhosis (elastography).  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  Diverticulosis of the descending colon and sigmoid colon noted with moderate wall thickening of the distal sigmoid colon, possibly sequela of prior diverticulitis versus acute inflammation or underlying malignancy.  Small bilateral pleural effusions with adjacent subsegmental atelectasis noted.   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   She underwent colonoscopy on 09/07/2022 with apparent extraluminal compression in the distal sigmoid colon, approximately 20 cm from the anal verge.  Mucosa in his area was edematous and biopsied.  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   CA-125 on 7/28 was 7,356. CEA was normal (1). C4 carboplatin/taxol on 11/14/22. CA 125 at that time was 4,552.  CT scan on 11/07/2022 returned with: 1. Small to moderate volume ascites, decreased. 2. Diffuse omental soft tissue caking and thickening and enhancement of the right deep pelvic peritoneum, compatible with peritoneal carcinomatosis, not substantially changed from 08/23/2022 CT. 3. Small to moderate posterior left pleural effusion, increased. Trace  posterior right pleural effusion is stable. 4. No lymphadenopathy or additional findings of metastatic disease in the chest, abdomen or pelvis. 5. Chronic findings include: Three-vessel coronary atherosclerosis. Moderate hiatal hernia. Marked left colonic diverticulosis. Small cystocele. Aortic Atherosclerosis.  Pre-operatively, she did undergo a chest xray on 11/30/2022 along with a thoracentesis on 12/10/2022 with 1.4 L removed with cytology returning with adenocarcinoma.  On 12/12/2022, she underwent diagnostic laparoscopy, conversion to exploratory laparotomy with radical tumor debulking including total omentectomy, resection of peritoneal disease, small bowel resection with 3 anastomosis, en bloc resection of the left fallopian tube and ovary as well as rectosigmoid colon, left ureterolysis, right salpingo-oophorectomy, end descending colostomy, cystoscopy with Dr. Eugene Garnet. Her hospital admission was from 12/12/2022 to 12/21/2022.   She presented to the office on 12/24/2022 for post-operative follow up and staple removal. At that time, her abdominal incision was erythematous with concerns for early incisional cellulitis. She was started on keflex at that time. Labs were obtained with WBC count at 6.3, Hgb 8.6, Hct 26.5. The incision remained intact after staple removal and there was no drainage to obtain for culture. She re-presented to the office on 12/31/2022 for follow up and incision assessment. At that time, the incision had worsening erythema. Recommendations at that visit included changing oral antibiotics to doxycycline and flagyl with a stat CT scan ordered to evaluate for underlying fluid collection.   The CT scan was performed on 01/01/2023 and resulted: 1. 5.1 x 2.3 x 3.4 cm superficial abscess in the subcutaneous fat just below the umbilicus with surrounding skin thickening and cellulitis. 2. 4 cm thin elongated intra-abdominal abscess anteriorly near the liver. This appears to be  too small to drain percutaneously. 3. Extensive surgical changes with small  bowel anastomosis in the pelvis and a left-sided colostomy. No acute bowel findings. 4. Persistent moderate-sized left pleural effusion and small right pleural effusion with overlying atelectasis. 5. Stable hiatal hernia. 6. No obvious residual omental disease or peritoneal implants. 7. Aortic Atherosclerosis  Based on the results, she was admitted to the hospital to start IV antibiotics and underwent incision and drainage of her postoperative wound with application of wound VAC dressing by Dr. Antionette Char on 01/02/2023. Culture from the fluid/wound returned with few enterobacter cloacae, few enterococcus faecalis, rare serratia marcescens, rare pseudomonas aeruginosa. She was eventually transitioned to oral antibiotics, Augmentin and Cipro, since these were tolerated better than initial levaquin and flagyl. The wound VAC remained in place for 7 days while in the hospital. On the 01/09/2023, her wound had decreased in size significantly and wet to dry dressing changes to the two open areas on the incision were recommended. She was discharged home with home health in place on 01/09/2023 (admission date of 01/01/23).      Interval: She presents today to the office with her son for continued post-operative follow up and incision assessment. She has improved since her last visit. She reports having a decreased appetite at times and if she is not eating, she will try to drink an ensure. Nausea is intermittent, no emesis. Her ostomy is having thicker brown output with flatus. Emptying her bladder without difficulty. Feels fatigued by the end of the day. Minimal drainage noted from incision. No fever or chills reported but stays cold per pt. She has been changing the openings on her abdominal incision with wet to dry dressing changes and this seems to be going well. She went to the wound ostomy clinic yesterday and states a photo was  taken of the wound. Home health came recently and only reviewed her meds.      Exam: Alert, oriented, in no acute distress.  Lungs clear. Heart regular in rate and rhythm. Abdomen is soft, non-tender, non-distended. Ostomy appliance is intact with flatus and soft brown stool in bag. Stoma is pink/red. Midline abdominal incision with dry dressing in place. Erythema has improved significantly. Wound measurements below. No lower extrem edema noted. No drainage noted from incision. Wound measured and wet to dry dressings applied to upper and lower openings in the midline incision. Wound beds are shallow/pink/red, no tunneling present. Recent photo in media from ostomy appointment.   Upper abdominal wound 1.75 cm in length, 1 cm in width Lower abdominal incision wound measuring 3 cm in length, 1.5 cm in width     Latest Ref Rng & Units 01/08/2023    8:54 AM 01/06/2023    3:22 PM 01/04/2023    1:41 AM  CBC  WBC 4.0 - 10.5 K/uL 6.5  4.7  5.2   Hemoglobin 12.0 - 15.0 g/dL 46.9  62.9  9.6   Hematocrit 36.0 - 46.0 % 35.8  33.9  29.5   Platelets 150 - 400 K/uL 299  344  285        Latest Ref Rng & Units 01/08/2023    8:54 AM 01/06/2023    3:22 PM 01/04/2023    1:41 AM  BMP  Glucose 70 - 99 mg/dL 528  413  244   BUN 8 - 23 mg/dL 13  14  14    Creatinine 0.44 - 1.00 mg/dL 0.10  2.72  5.36   Sodium 135 - 145 mmol/L 138  136  136   Potassium 3.5 - 5.1 mmol/L 3.8  3.5  3.5   Chloride 98 - 111 mmol/L 103  105  106   CO2 22 - 32 mmol/L 26  26  23    Calcium 8.9 - 10.3 mg/dL 9.6  8.9  8.8      Assessment/Plan: 77 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 She is improving and her abdominal incision with two open wounds is healing well. She is advised to stop taking oral antibiotics today since improved and has completed 10 course. She has an appointment later in the week with Dr. Kirtland Bouchard at [facility] to resume  chemotherapy. Patient is advised to call for any changes, new symptoms such as fever, or needs. Advised we would plan on seeing her in the office at the completion of treatment or after interval imaging, or sooner if needed.

## 2023-01-15 NOTE — Patient Instructions (Addendum)
Your incision is healing well without signs of infection. You can stop taking the oral antibiotics since you have completed over 10 days worth of treatment.   Please call for any needs or concerns or new symptoms.   We will plan on seeing you after chemotherapy completion.

## 2023-01-16 ENCOUNTER — Other Ambulatory Visit: Payer: Self-pay

## 2023-01-16 NOTE — Progress Notes (Signed)
[facility] [address] [place] [address]    Clinic Day:  01/17/2023  Referring physician: Tower, Audrie Gallus, MD  Patient Care Team: Tower, Audrie Gallus, MD as PCP - General Doreatha Massed, MD as Medical Oncologist (Medical Oncology) Therese Sarah, RN as Oncology Nurse Navigator (Medical Oncology)   ASSESSMENT & PLAN:   Assessment: 1.  Peritoneal carcinomatosis/ovarian cancer: - Presentation: 57-month history of abdominal distention and bloating. - Last colonoscopy (01/01/2020): No evidence of malignancy. - CTAP (08/23/2022): Moderate ascites, probable omental caking in the left lower quadrant, possible small peritoneal implants seen in the pelvis.  Diverticulosis of descending and sigmoid colon with moderate wall thickening of the distal sigmoid colon.  Small bilateral pleural effusions. - 08/26/2022: CA125: 7356, CEA: 1 - Germline mutation testing (09/16/2017): Negative. - 4 cycles of carboplatin and paclitaxel from 09/12/2022 through 11/14/2022 - 12/12/2022: Tumor debulking surgery including total omentectomy, resection of peritoneal disease, small bowel resection with reanastomosis, en bloc resection of the left fallopian tube and ovary as well as rectosigmoid colon, left ureterolysis, right salpingo-oophorectomy, end descending colostomy by Dr. Pricilla Holm - Pathology: Omental resection-high-grade serous carcinoma, cecal implant-high-grade serous carcinoma, ileal resection-mesenteric and serosal involvement by high-grade serous carcinoma, left lower abdominal wall nodule-high-grade serous carcinoma, right tube and ovary resection-high-grade serous carcinoma involving serosa, bladder peritoneum and rectosigmoid colon-high-grade serous carcinoma involving colonic wall and extensive involvement of the serosa and mesenteric adipose tissue.  Metastatic carcinoma involving 8/13 nodes.  pT3 cpN1a. - NGS: MSI-stable, TMB-low, HRD-negative, T p53 pathogenic variant, PD-L1 (22  C3) CPS 5, ER 2+ positive, FOLR1 2+, 75% (mirvetuximab) - Cycle 5 of carboplatin and paclitaxel on 01/17/2023   2. Social/Family History: -She lives at home with husband and is his primary caregiver. She is independent ADLS and iADLS. Retired International aid/development worker. No tobacco use.  -She had skin squamous cell carcinoma. Her brother had colon cancer at 20 and follicular non-Hodgkin's lymphoma and DLBCL, her maternal grandfather died of prostate cancer, maternal first cousin had colon cancer, and maternal aunt died of lung cancer (smoker).     Plan: 1.  Peritoneal carcinomatosis/ovarian cancer: - I have reviewed procedure note and pathology report in detail. - I have also reviewed hospitalization records. - We discussed NGS results. - She has a small abdominal wound at the superior aspect of the midline scar and inferior aspect of the scar with pink granulation tissue. - She has completed antibiotics. - We reviewed labs today: Normal LFTs and creatinine.  CBC grossly normal.  CA125 is pending. - She may proceed with cycle 5 of chemotherapy without G-CSF support at today.  RTC 3 weeks for follow-up.   2.  Intermittent nausea: - Continue Compazine 5 mg every 6 hours as needed for intermittent nausea.   3.  Left gastrocnemius/calf vein distal DVT (Dx 07/24/2022): - Continue Eliquis twice daily.  No bleeding issues reported.   4.  Peripheral neuropathy: - She has tingling in the fingertips which is constant and stable.  Also tingling in the toes which is stable.  Paclitaxel is 20% dose reduced.  5. Iron deficiency anemia: -- Last Feraheme on 11/05/2022.  Hemoglobin is 11.1 today.    Orders Placed This Encounter  Procedures   Magnesium    Standing Status:   Future    Expected Date:   02/21/2023    Expiration Date:   02/21/2024   CA 125    Standing Status:   Future    Expected Date:   02/21/2023  Expiration Date:   02/21/2024   CBC with Differential    Standing Status:   Future    Expected  Date:   02/21/2023    Expiration Date:   02/21/2024   Comprehensive metabolic panel    Standing Status:   Future    Expected Date:   02/21/2023    Expiration Date:   02/21/2024      Mikeal Hawthorne R Teague,acting as a scribe for Doreatha Massed, MD.,have documented all relevant documentation on the behalf of Doreatha Massed, MD,as directed by  Doreatha Massed, MD while in the presence of Doreatha Massed, MD.  I, Doreatha Massed MD, have reviewed the above documentation for accuracy and completeness, and I agree with the above.    Doreatha Massed, MD   12/19/20243:55 PM  CHIEF COMPLAINT:   Diagnosis: Peritoneal carcinomatosis/ovarian cancer    Cancer Staging  No matching staging information was found for the patient.    Prior Therapy: none  Current Therapy:  Neoadjuvant carboplatin and paclitaxel    HISTORY OF PRESENT ILLNESS:   Oncology History  Carcinomatosis (HCC)  08/31/2022 Initial Diagnosis   Carcinomatosis (HCC)   09/12/2022 -  Chemotherapy   Patient is on Treatment Plan : OVARIAN Carboplatin (AUC 6) + Paclitaxel (175) q21d X 6 Cycles        INTERVAL HISTORY:   Rachel Carlson is a 77 y.o. female presenting to clinic today for follow up of Peritoneal carcinomatosis/ovarian cancer. She was last seen by Dr. Anders Simmonds on 11/14/22.  Since her last visit, she underwent diagnostic laparoscopy with omentectomy and bilateral salpingo-oophorectomy on 12/12/22. Surgical pathology of the omentum resection revealed: high-grade serous carcinoma with extensive involvement. The cecal implant revealed: high-grade serous carcinoma. The ileum resection revealed: mesenteric and serosal involvement by high-grade serous carcinoma and metastatic carcinoma in one of three lymph nodes (1/3). The left lower abdominal wall nodule revealed: high-grade serous carcinoma. The right tube and ovary revealed: high-grade serous carcinoma involving serosa of adnexal connective tissue, with the  ovarian parenchyma and fallopian tube negative for carcinoma. The bladder peritoneum biopsy revealed: focal high-grade serous carcinoma. The rectosigmoid colon with left tube and ovary revealed: high-grade serous carcinoma involving the colonic wall with extensive involvement of the serosa and mesenteric adipose tissue, metastatic carcinoma in eight of thirteen lymph nodes (8/13), and high-grade serous carcinoma involving the right ovary, predominantly serosal and paraovarian. Caris testing was positive for FOLR1 (2+), ER (2+), and PD-L1.   She was admitted to the hospital from 01/01/23 to 01/09/23 for a postoperative wound infection. She underwent an incision and drainage of the abdominal wound seroma on 12/4. She was given IV zosyn and vancomycin, then oral Augmentin and Cipro for 10 days.   Today, she states that she is doing well overall. Her appetite level is at 60%. Her energy level is at 50%.  She was by Warner Mccreedy while hospitalized who recommended she start chemotherapy. Her incision site has been healing well. She denies any diarrhea. Peripheral neuropathy in fingers is mildly numb and in toes is tingling at night. Neuropathy in fingers makes it difficult to grasp objects, though she denies any pain to the area. She has intermittent nausea, and is taking compazine prn. She has restarted Eliquis after surgery.   PAST MEDICAL HISTORY:   Past Medical History: 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)  Esophageal stricture    Family history of breast cancer    Family history of colon cancer    Family history of melanoma    Family history of prostate cancer    Fibroids 1998   uterine, history of (left ovaries)   GERD (gastroesophageal reflux disease)    History of hiatal hernia    History of uterine prolapse    Hx of adenomatous polyp of colon 10/19/2014    Hyperlipidemia    Hypertension    borderline no meds diet and exercise   Neuromuscular disorder (HCC)    neuropathy from chemo   Obesity    Ovarian cancer (HCC)    PVC (premature ventricular contraction)    first dx by holter in 1980's, echo (6/10) EF 55-60%, normal diastolic fxn, normal size RV and fxn, mild MR, PASP   Squamous cell carcinoma of arm    ovarian   Tubal pregnancy    Ulcerative proctosigmoiditis (HCC)    Urge urinary incontinence 06/03/2007   Qualifier: Diagnosis of  By: Lindwood Qua CMA, Jerl Santos      Surgical History: Past Surgical History:  Procedure Laterality Date   BILATERAL SALPINGECTOMY Bilateral 12/12/2022   Procedure: OPEN BILATERAL SALPINGECTOMY-OOPHORECTOMY;  Surgeon: Carver Fila, MD;  Location: [facility];  Service: Gynecology;  Laterality: Bilateral;   BLADDER NECK SUSPENSION  1998   BREAST BIOPSY Left    BREAST LUMPECTOMY Left    benign   CATARACT EXTRACTION Right 01/2020   CATARACT EXTRACTION W/PHACO Left 08/12/2020   Procedure: CATARACT EXTRACTION PHACO AND INTRAOCULAR LENS PLACEMENT LEFT EYE;  Surgeon: Fabio Pierce, MD;  Location: [facility];  Service: Ophthalmology;  Laterality: Left;  left CDE=10.71   COLONOSCOPY  05/18/2011/10/12/14   Dr. Stan Head   DEBULKING N/A 12/12/2022   Procedure: RADICAL TUMOR DEBULKING, PERITONEAL STRIPPING, MOBILIZATION SPLENIC FLEXURE, RECTOSIGMOID RESECTION,SMALL BOWEL RESECTION AND REANASTOMOSIS ,END TO END DESCENDING COLOSTOMY;  Surgeon: Carver Fila, MD;  Location: [facility];  Service: Gynecology;  Laterality: N/A;   ECTOPIC PREGNANCY SURGERY  1980   ESOPHAGOGASTRODUODENOSCOPY (EGD) WITH ESOPHAGEAL DILATION  04/03/2012   Dr. Stan Head   INCISION AND DRAINAGE ABSCESS N/A 01/02/2023   Procedure: INCISION AND DRAINAGE OF ABDOMINAL WOUND SEROMA;  Surgeon: Antionette Char, MD;  Location: [facility];  Service: Gynecology;  Laterality: N/A;   IR IMAGING GUIDED PORT INSERTION  09/06/2022   OMENTECTOMY  N/A 12/12/2022   Procedure: OMENTECTOMY;  Surgeon: Carver Fila, MD;  Location: [facility];  Service: Gynecology;  Laterality: N/A;   PARTIAL HYSTERECTOMY     abdominal, fibroids, and prolapse (1998) bladder tack   SALPINGOOPHORECTOMY Bilateral 12/12/2022   Procedure: DIAGNOSTIC LAPAROSCOPY;  Surgeon: Carver Fila, MD;  Location: [facility];  Service: Gynecology;  Laterality: Bilateral;   SKIN CANCER EXCISION     UPPER GASTROINTESTINAL ENDOSCOPY      Social History: Social History   Socioeconomic History   Marital status: Married    Spouse name: Not on file   Number of children: 3   Years of education: Not on file   Highest education level: Not on file  Occupational History   Occupation: International aid/development worker    Comment: retired 2009    Employer: unemployed  Tobacco Use   Smoking status: Never   Smokeless tobacco: Never  Vaping Use   Vaping status: Never Used  Substance and Sexual Activity   Alcohol use: No    Alcohol/week: 0.0 standard drinks of alcohol    Comment: rarely beer  Drug use: No   Sexual activity: Not Currently  Other Topics Concern   Not on file  Social History Narrative   Retired International aid/development worker, 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)   Grown children   Never smoker no alcohol tobacco or drug use   Social Drivers of Corporate investment banker Strain: Low Risk  (08/28/2022)   Overall Financial Resource Strain (CARDIA)    Difficulty of Paying Living Expenses: Not hard at all  Food Insecurity: No Food Insecurity (01/10/2023)   Hunger Vital Sign    Worried About Running Out of Food in the Last Year: Never true    Ran Out of Food in the Last Year: Never true  Transportation Needs: No Transportation Needs (01/10/2023)   PRAPARE - Administrator, Civil Service (Medical): No    Lack of Transportation (Non-Medical): No  Physical Activity: Inactive (08/28/2022)   Exercise Vital Sign    Days of Exercise per Week: 0 days    Minutes of Exercise per  Session: 0 min  Stress: No Stress Concern Present (08/28/2022)   [employer] of Occupational Health - Occupational Stress Questionnaire    Feeling of Stress : Not at all  Social Connections: Moderately Integrated (08/28/2022)   Social Connection and Isolation Panel [NHANES]    Frequency of Communication with Friends and Family: More than three times a week    Frequency of Social Gatherings with Friends and Family: More than three times a week    Attends Religious Services: Never    Database administrator or Organizations: Yes    Attends Engineer, structural: More than 4 times per year    Marital Status: Married  Catering manager Violence: Not At Risk (01/10/2023)   Humiliation, Afraid, Rape, and Kick questionnaire    Fear of Current or Ex-Partner: No    Emotionally Abused: No    Physically Abused: No    Sexually Abused: No    Family History: Family History  Problem Relation Age of Onset   Sudden death Father 68       "coronary arteriosclerosis" on death certificate   Heart attack Mother 108   Transient ischemic attack Mother    Diabetes type II Mother    Sudden death Mother        died age 20   Diabetes Mother    Coronary artery disease Mother    Skin cancer Mother    Barrett's esophagus Mother    Arrhythmia Brother    Colon cancer Brother 21   Breast cancer Sister        dx in her 37s   Colon polyps Sister        adenomatous   Colon cancer Son 4       lynch syndrome   Melanoma Maternal Uncle    Congestive Heart Failure Maternal Grandmother    Prostate cancer Maternal Grandfather        dx in 70s   Tuberculosis Paternal Grandfather    Skin cancer Maternal Aunt    Barrett's esophagus Maternal Aunt    Other Son        parotid gland tumor   Ehlers-Danlos syndrome Daughter    Colon cancer Cousin        mid 69s; maternal cousin   Colon cancer Other        MGMs brother   Esophageal cancer Neg Hx    Stomach cancer Neg Hx    Rectal cancer Neg Hx  Current Medications:  Current Outpatient Medications:    apixaban (ELIQUIS) 5 MG TABS tablet, Take 1 tablet (5 mg total) by mouth 2 (two) times daily., Disp: 180 tablet, Rfl: 0   Ascorbic Acid (VITAMIN C) 1000 MG tablet, Take 1,000 mg by mouth daily., Disp: , Rfl:    calcium citrate-vitamin D (CITRACAL+D) 315-200 MG-UNIT per tablet, Take 1 tablet by mouth 2 (two) times daily., Disp: , Rfl:    CARBOPLATIN IV, Inject into the vein every 21 ( twenty-one) days. port, Disp: , Rfl:    Cholecalciferol (VITAMIN D-3) 5000 UNITS TABS, Take 5,000 Units by mouth daily., Disp: , Rfl:    ciprofloxacin (CIPRO) 500 MG tablet, Take 1 tablet (500 mg total) by mouth 2 (two) times daily., Disp: 20 tablet, Rfl: 0   Coenzyme Q10 (CO Q-10) 200 MG CAPS, Take 200 mg by mouth daily., Disp: , Rfl:    CRANBERRY PO, Take 650 mg by mouth daily., Disp: , Rfl:    Glucosamine-Chondroitin (GLUCOSAMINE CHONDR COMPLEX PO), Take 2 capsules by mouth daily., Disp: , Rfl:    GRAPE SEED EXTRACT PO, Take 1 capsule by mouth daily., Disp: , Rfl:    ketoconazole (NIZORAL) 2 % cream, Apply 1 Application topically daily. To yeast rash, Disp: 30 g, Rfl: 1   lansoprazole (PREVACID) 15 MG capsule, Take 1 capsule (15 mg total) by mouth 2 (two) times daily before a meal., Disp: , Rfl:    lidocaine-prilocaine (EMLA) cream, Apply to affected area once (Patient taking differently: Apply 1 Application topically daily as needed (prior to port access).), Disp: 30 g, Rfl: 3   milk thistle 175 MG tablet, Take 175 mg by mouth daily., Disp: , Rfl:    mirtazapine (REMERON) 15 MG tablet, Take 1 tablet (15 mg total) by mouth at bedtime., Disp: 30 tablet, Rfl: 4   Omega-3 Fatty Acids (FISH OIL TRIPLE STRENGTH PO), Take 1 capsule by mouth daily., Disp: , Rfl:    ondansetron (ZOFRAN) 8 MG tablet, Take 1 tablet (8 mg total) by mouth every 8 (eight) hours as needed for nausea or vomiting. Start on the third day after carboplatin., Disp: 30 tablet, Rfl: 1    PACLITAXEL IV, Inject into the vein every 21 ( twenty-one) days. port, Disp: , Rfl:    phenazopyridine (PYRIDIUM) 200 MG tablet, Take 1 tablet (200 mg total) by mouth 3 (three) times daily as needed for pain., Disp: 10 tablet, Rfl: 0   Polyethyl Glycol-Propyl Glycol (SYSTANE OP), Place 1 drop into both eyes 4 (four) times daily as needed (for dryness)., Disp: , Rfl:    PREVACID 24HR 15 MG capsule, Take 15 mg by mouth See admin instructions. Take 15 mg by mouth in the morning before any food and an additional 15 mg once a day as needed for unresolved reflux, Disp: , Rfl:    prochlorperazine (COMPAZINE) 10 MG tablet, Take 10 mg by mouth every 6 (six) hours as needed for nausea or vomiting., Disp: , Rfl:    rosuvastatin (CRESTOR) 20 MG tablet, Take 1 tablet (20 mg total) by mouth at bedtime., Disp: 90 tablet, Rfl: 3   simethicone (MYLICON) 80 MG chewable tablet, Chew 1 tablet (80 mg total) by mouth 4 (four) times daily as needed for flatulence., Disp: 30 tablet, Rfl: 4   traMADol (ULTRAM) 50 MG tablet, Take 1 tablet (50 mg total) by mouth every 6 (six) hours as needed for severe pain (pain score 7-10). For AFTER surgery only, do not take and drive, Disp:  15 tablet, Rfl: 0   traZODone (DESYREL) 50 MG tablet, Take 1 tablet (50 mg total) by mouth at bedtime as needed for sleep., Disp: 30 tablet, Rfl: 1   vitamin B-12 (CYANOCOBALAMIN) 1000 MCG tablet, Take 1,000 mcg by mouth daily., Disp: , Rfl:    zinc gluconate 50 MG tablet, Take 50 mg by mouth daily., Disp: , Rfl:   Current Facility-Administered Medications:    botulinum toxin Type A (BOTOX) injection 200 Units, 200 Units, Intramuscular, Once,   Facility-Administered Medications Ordered in Other Visits:    sodium chloride flush (NS) 0.9 % injection 10 mL, 10 mL, Intracatheter, PRN, Doreatha Massed, MD, 10 mL at 01/17/23 1438   Allergies: Allergies  Allergen Reactions   Aspartame     Intolerance to artifical sweeteners, pt states caused burning  of bladder   Levaquin [Levofloxacin] Nausea Only   Pantoprazole Sodium Diarrhea    Abdominal pain   Ranitidine Diarrhea    abdominal pain   Omeprazole Diarrhea    Abdominal pain   Sulfasalazine Rash and Other (See Comments)    Fever, chills, headache, muscle pain also    REVIEW OF SYSTEMS:   Review of Systems  Constitutional:  Negative for chills, fatigue and fever.  HENT:   Positive for trouble swallowing (occasional). Negative for lump/mass, mouth sores, nosebleeds and sore throat.   Eyes:  Negative for eye problems.  Respiratory:  Negative for cough and shortness of breath.   Cardiovascular:  Negative for chest pain, leg swelling and palpitations.  Gastrointestinal:  Positive for nausea. Negative for abdominal pain, constipation, diarrhea and vomiting.  Genitourinary:  Negative for bladder incontinence, difficulty urinating, dysuria, frequency, hematuria and nocturia.   Musculoskeletal:  Negative for arthralgias, back pain, flank pain, myalgias and neck pain.  Skin:  Negative for itching and rash.  Neurological:  Positive for numbness (and tingling in hands and feet). Negative for dizziness and headaches.  Hematological:  Does not bruise/bleed easily.  Psychiatric/Behavioral:  Negative for depression, sleep disturbance and suicidal ideas. The patient is not nervous/anxious.   All other systems reviewed and are negative.    VITALS:   Blood pressure (!) 146/72, pulse (!) 106, temperature 99 F (37.2 C), temperature source Tympanic, resp. rate 19, height 5\' 2"  (1.575 m), weight 160 lb 3.2 oz (72.7 kg), last menstrual period 05/29/1996, SpO2 98%.  Wt Readings from Last 3 Encounters:  01/17/23 160 lb 3.2 oz (72.7 kg)  01/15/23 159 lb (72.1 kg)  01/02/23 158 lb 15.2 oz (72.1 kg)    Body mass index is 29.3 kg/m.  Performance status (ECOG): 1 - Symptomatic but completely ambulatory  PHYSICAL EXAM:   Physical Exam Vitals and nursing note reviewed. Exam conducted with a  chaperone present.  Constitutional:      Appearance: Normal appearance.  Cardiovascular:     Rate and Rhythm: Normal rate and regular rhythm.     Pulses: Normal pulses.     Heart sounds: Normal heart sounds.  Pulmonary:     Effort: Pulmonary effort is normal.     Breath sounds: Normal breath sounds.  Abdominal:     Palpations: Abdomen is soft. There is no hepatomegaly, splenomegaly or mass.     Tenderness: There is no abdominal tenderness.  Musculoskeletal:     Right lower leg: No edema.     Left lower leg: No edema.  Lymphadenopathy:     Cervical: No cervical adenopathy.     Right cervical: No superficial, deep or posterior cervical adenopathy.  Left cervical: No superficial, deep or posterior cervical adenopathy.     Upper Body:     Right upper body: No supraclavicular or axillary adenopathy.     Left upper body: No supraclavicular or axillary adenopathy.  Neurological:     General: No focal deficit present.     Mental Status: She is alert and oriented to person, place, and time.  Psychiatric:        Mood and Affect: Mood normal.        Behavior: Behavior normal.     LABS:      Latest Ref Rng & Units 01/17/2023    8:07 AM 01/08/2023    8:54 AM 01/06/2023    3:22 PM  CBC  WBC 4.0 - 10.5 K/uL 4.7  6.5  4.7   Hemoglobin 12.0 - 15.0 g/dL 65.7  84.6  96.2   Hematocrit 36.0 - 46.0 % 33.9  35.8  33.9   Platelets 150 - 400 K/uL 257  299  344       Latest Ref Rng & Units 01/17/2023    8:07 AM 01/08/2023    8:54 AM 01/06/2023    3:22 PM  CMP  Glucose 70 - 99 mg/dL 952  841  324   BUN 8 - 23 mg/dL 11  13  14    Creatinine 0.44 - 1.00 mg/dL 4.01  0.27  2.53   Sodium 135 - 145 mmol/L 137  138  136   Potassium 3.5 - 5.1 mmol/L 3.1  3.8  3.5   Chloride 98 - 111 mmol/L 104  103  105   CO2 22 - 32 mmol/L 22  26  26    Calcium 8.9 - 10.3 mg/dL 9.3  9.6  8.9   Total Protein 6.5 - 8.1 g/dL 6.2     Total Bilirubin <1.2 mg/dL 0.6     Alkaline Phos 38 - 126 U/L 48     AST 15 -  41 U/L 17     ALT 0 - 44 U/L 12        Lab Results  Component Value Date   CEA1 1.0 08/26/2022   /  CEA  Date Value Ref Range Status  08/26/2022 1.0 0.0 - 4.7 ng/mL Final    Comment:    (NOTE)                             Nonsmokers          <3.9                             Smokers             <5.6 [employer] Electrochemiluminescence Immunoassay (ECLIA) Values obtained with different assay methods or kits cannot be used interchangeably.  Results cannot be interpreted as absolute evidence of the presence or absence of malignant disease. Performed At: [facility] [address] [place], [place] [id number] Jolene Schimke MD QV:[id number]    No results found for: "PSA1" No results found for: "CAN199" Lab Results  Component Value Date   CAN125 4,552.0 (H) 11/14/2022    No results found for: "TOTALPROTELP", "ALBUMINELP", "A1GS", "A2GS", "BETS", "BETA2SER", "GAMS", "MSPIKE", "SPEI" Lab Results  Component Value Date   TIBC 369 10/24/2022   FERRITIN 22 10/24/2022   IRONPCTSAT 10 (L) 10/24/2022   No results found for: "LDH"   STUDIES:   CT ABDOMEN PELVIS  W CONTRAST Result Date: 01/01/2023 CLINICAL DATA:  Postoperative abdominal pain with worsening cellulitis surround the abdominal incision site. History of recent surgery for ovarian cancer and peritoneal carcinomatosis. EXAM: CT ABDOMEN AND PELVIS WITH CONTRAST TECHNIQUE: Multidetector CT imaging of the abdomen and pelvis was performed using the standard protocol following bolus administration of intravenous contrast. RADIATION DOSE REDUCTION: This exam was performed according to the departmental dose-optimization program which includes automated exposure control, adjustment of the mA and/or kV according to patient size and/or use of iterative reconstruction technique. CONTRAST:  OMNIPAQUE IOHEXOL 300 MG/ML  SOLN COMPARISON:  CT scan 11/07/2022 FINDINGS: Lower chest: Persistent moderate-sized left pleural  effusion and small right pleural effusion with overlying atelectasis. Stable large hiatal hernia. No pulmonary nodules or enhancing pleural nodules. Hepatobiliary: No hepatic lesions or intrahepatic biliary dilatation. No obvious peritoneal surface disease. The gallbladder is moderately contracted. No common bile duct dilatation. Pancreas: No mass, inflammation or ductal dilatation. Spleen: Normal size. No focal lesions or peritoneal surface disease. Adrenals/Urinary Tract: The adrenal glands and kidneys are unremarkable. Bladder is grossly normal. Stomach/Bowel: Extensive surgical changes with small bowel anastomosis in the pelvis and a Hartmann's pouch and left sided colostomy. The stomach, duodenum, small bowel and colon are grossly normal. No acute inflammatory process or obstructive findings. Vascular/Lymphatic: Atherosclerotic calcifications involving the aorta and branch vessels but no aneurysm or dissection. The major venous structures are patent. Small scattered mesenteric and retroperitoneal lymph nodes but no mass or overt adenopathy. I do not see any obvious residual omental disease or obvious peritoneal implants. Reproductive: Surgically absent. Other: Mild mesenteric interstitial changes and a small amount of free pelvic fluid. There is a very thin slightly elongated rim enhancing fluid collection in the anterior abdomen adjacent to the liver on image number 30/2 measuring a maximum of 4 cm. This could be a small intra-abdominal abscess. There is a tiny focus of free air nearby laterally and also medially. There is a superficial abscess noted in the subcutaneous fat with shaggy rim like enhancement and some associated gas. This measures approximately 5.1 x 2.3 x 3.4 cm and is just below the umbilicus. Associated surrounding skin thickening and cellulitis. Musculoskeletal: No significant bony findings. IMPRESSION: 1. 5.1 x 2.3 x 3.4 cm superficial abscess in the subcutaneous fat just below the umbilicus  with surrounding skin thickening and cellulitis. 2. 4 cm thin elongated intra-abdominal abscess anteriorly near the liver. This appears to be too small to drain percutaneously. 3. Extensive surgical changes with small bowel anastomosis in the pelvis and a left-sided colostomy. No acute bowel findings. 4. Persistent moderate-sized left pleural effusion and small right pleural effusion with overlying atelectasis. 5. Stable hiatal hernia. 6. No obvious residual omental disease or peritoneal implants. Aortic Atherosclerosis (ICD10-I70.0). Electronically Signed   By: Rudie Meyer M.D.   On: 01/01/2023 18:33

## 2023-01-17 ENCOUNTER — Inpatient Hospital Stay: Payer: Medicare Other

## 2023-01-17 ENCOUNTER — Encounter: Payer: Self-pay | Admitting: Hematology

## 2023-01-17 ENCOUNTER — Inpatient Hospital Stay: Payer: Medicare Other | Admitting: Hematology

## 2023-01-17 VITALS — BP 135/72 | HR 91 | Temp 97.8°F | Resp 18

## 2023-01-17 VITALS — BP 146/72 | HR 106 | Temp 99.0°F | Resp 19 | Ht 62.0 in | Wt 160.2 lb

## 2023-01-17 DIAGNOSIS — C800 Disseminated malignant neoplasm, unspecified: Secondary | ICD-10-CM | POA: Diagnosis not present

## 2023-01-17 DIAGNOSIS — Z90710 Acquired absence of both cervix and uterus: Secondary | ICD-10-CM | POA: Diagnosis not present

## 2023-01-17 DIAGNOSIS — Z95828 Presence of other vascular implants and grafts: Secondary | ICD-10-CM

## 2023-01-17 DIAGNOSIS — T8149XA Infection following a procedure, other surgical site, initial encounter: Secondary | ICD-10-CM | POA: Diagnosis not present

## 2023-01-17 DIAGNOSIS — C482 Malignant neoplasm of peritoneum, unspecified: Secondary | ICD-10-CM | POA: Diagnosis not present

## 2023-01-17 DIAGNOSIS — Z90722 Acquired absence of ovaries, bilateral: Secondary | ICD-10-CM | POA: Diagnosis not present

## 2023-01-17 LAB — CBC WITH DIFFERENTIAL/PLATELET
Abs Immature Granulocytes: 0.01 10*3/uL (ref 0.00–0.07)
Basophils Absolute: 0 10*3/uL (ref 0.0–0.1)
Basophils Relative: 0 %
Eosinophils Absolute: 0.3 10*3/uL (ref 0.0–0.5)
Eosinophils Relative: 7 %
HCT: 33.9 % — ABNORMAL LOW (ref 36.0–46.0)
Hemoglobin: 11.1 g/dL — ABNORMAL LOW (ref 12.0–15.0)
Immature Granulocytes: 0 %
Lymphocytes Relative: 25 %
Lymphs Abs: 1.2 10*3/uL (ref 0.7–4.0)
MCH: 31.3 pg (ref 26.0–34.0)
MCHC: 32.7 g/dL (ref 30.0–36.0)
MCV: 95.5 fL (ref 80.0–100.0)
Monocytes Absolute: 0.4 10*3/uL (ref 0.1–1.0)
Monocytes Relative: 9 %
Neutro Abs: 2.7 10*3/uL (ref 1.7–7.7)
Neutrophils Relative %: 59 %
Platelets: 257 10*3/uL (ref 150–400)
RBC: 3.55 MIL/uL — ABNORMAL LOW (ref 3.87–5.11)
RDW: 15.6 % — ABNORMAL HIGH (ref 11.5–15.5)
WBC: 4.7 10*3/uL (ref 4.0–10.5)
nRBC: 0 % (ref 0.0–0.2)

## 2023-01-17 LAB — COMPREHENSIVE METABOLIC PANEL
ALT: 12 U/L (ref 0–44)
AST: 17 U/L (ref 15–41)
Albumin: 3.3 g/dL — ABNORMAL LOW (ref 3.5–5.0)
Alkaline Phosphatase: 48 U/L (ref 38–126)
Anion gap: 11 (ref 5–15)
BUN: 11 mg/dL (ref 8–23)
CO2: 22 mmol/L (ref 22–32)
Calcium: 9.3 mg/dL (ref 8.9–10.3)
Chloride: 104 mmol/L (ref 98–111)
Creatinine, Ser: 0.59 mg/dL (ref 0.44–1.00)
GFR, Estimated: >60 mL/min (ref >60)
Glucose, Bld: 136 mg/dL — ABNORMAL HIGH (ref 70–99)
Potassium: 3.1 mmol/L — ABNORMAL LOW (ref 3.5–5.1)
Sodium: 137 mmol/L (ref 135–145)
Total Bilirubin: 0.6 mg/dL (ref <1.2)
Total Protein: 6.2 g/dL — ABNORMAL LOW (ref 6.5–8.1)

## 2023-01-17 LAB — MAGNESIUM: Magnesium: 1.8 mg/dL (ref 1.7–2.4)

## 2023-01-17 MED ORDER — HEPARIN SOD (PORK) LOCK FLUSH 100 UNIT/ML IV SOLN
500.0000 [IU] | Freq: Once | INTRAVENOUS | Status: AC | PRN
Start: 2023-01-17 — End: 2023-01-17
  Administered 2023-01-17: 500 [IU]

## 2023-01-17 MED ORDER — SODIUM CHLORIDE FLUSH 0.9 % IV SOLN
10.0000 mL | Freq: Once | INTRAVENOUS | Status: AC
Start: 2023-01-17 — End: 2023-01-17
  Administered 2023-01-17: 10 mL via INTRAVENOUS
  Filled 2023-01-17: qty 10

## 2023-01-17 MED ORDER — CETIRIZINE HCL 10 MG/ML IV SOLN
10.0000 mg | Freq: Once | INTRAVENOUS | Status: AC
  Administered 2023-01-17: 10 mg via INTRAVENOUS
  Filled 2023-01-17: qty 1

## 2023-01-17 MED ORDER — DEXAMETHASONE SODIUM PHOSPHATE 10 MG/ML IJ SOLN
10.0000 mg | Freq: Once | INTRAMUSCULAR | Status: AC
  Administered 2023-01-17: 10 mg via INTRAVENOUS
  Filled 2023-01-17: qty 1

## 2023-01-17 MED ORDER — SODIUM CHLORIDE 0.9 % IV SOLN
539.4000 mg | Freq: Once | INTRAVENOUS | Status: AC
Start: 2023-01-17 — End: 2023-01-17
  Administered 2023-01-17: 540 mg via INTRAVENOUS
  Filled 2023-01-17: qty 54

## 2023-01-17 MED ORDER — SODIUM CHLORIDE 0.9 % IV SOLN
150.0000 mg | Freq: Once | INTRAVENOUS | Status: AC
  Administered 2023-01-17: 150 mg via INTRAVENOUS
  Filled 2023-01-17: qty 150

## 2023-01-17 MED ORDER — PALONOSETRON HCL INJECTION 0.25 MG/5ML
0.2500 mg | Freq: Once | INTRAVENOUS | Status: AC
  Administered 2023-01-17: 0.25 mg via INTRAVENOUS
  Filled 2023-01-17: qty 5

## 2023-01-17 MED ORDER — FAMOTIDINE IN NACL 20-0.9 MG/50ML-% IV SOLN
20.0000 mg | Freq: Once | INTRAVENOUS | Status: AC
  Administered 2023-01-17: 20 mg via INTRAVENOUS
  Filled 2023-01-17: qty 50

## 2023-01-17 MED ORDER — SODIUM CHLORIDE 0.9 % IV SOLN: Freq: Once | INTRAVENOUS | Status: AC

## 2023-01-17 MED ORDER — SODIUM CHLORIDE 0.9% FLUSH
10.0000 mL | INTRAVENOUS | Status: DC | PRN
Start: 2023-01-17 — End: 2023-01-17
  Administered 2023-01-17: 10 mL

## 2023-01-17 MED ORDER — SODIUM CHLORIDE 0.9 % IV SOLN: 10.0000 mg | Freq: Once | INTRAVENOUS | Status: DC

## 2023-01-17 MED ORDER — PACLITAXEL CHEMO INJECTION 300 MG/50ML
140.0000 mg/m2 | Freq: Once | INTRAVENOUS | Status: AC
  Administered 2023-01-17: 270 mg via INTRAVENOUS
  Filled 2023-01-17: qty 45

## 2023-01-17 NOTE — Progress Notes (Signed)
Patient presents today for Taxol and Carboplatin infusions per providers order.  Vital signs and labs reviewed by MD.  Message received from Chapman Moss RN/Dr. Ellin Saba, patient okay for treatment.  Treatment given today per MD orders.  Stable during infusion without adverse affects.  Vital signs stable.  No complaints at this time.  Discharge from clinic ambulatory in stable condition.  Alert and oriented X 3.  Follow up with [facility] as scheduled.

## 2023-01-17 NOTE — Patient Instructions (Signed)

## 2023-01-17 NOTE — Progress Notes (Signed)
Patient has been examined by Dr. Katragadda. Vital signs and labs have been reviewed by MD - ANC, Creatinine, LFTs, hemoglobin, and platelets are within treatment parameters per M.D. - pt may proceed with treatment.  Primary RN and pharmacy notified.  

## 2023-01-17 NOTE — Patient Instructions (Signed)
 [facility] [place] - A DEPT OF [facility]  Discharge Instructions: Thank you for choosing [facility] to provide your oncology and hematology care.  If you have a lab appointment with the Cancer Center - please note that after April 8th, 2024, all labs will be drawn in the cancer center.  You do not have to check in or register with the main entrance as you have in the past but will complete your check-in in the cancer center.  Wear comfortable clothing and clothing appropriate for easy access to any Portacath or PICC line.   We strive to give you quality time with your provider. You may need to reschedule your appointment if you arrive late (15 or more minutes).  Arriving late affects you and other patients whose appointments are after yours.  Also, if you miss three or more appointments without notifying the office, you may be dismissed from the clinic at the provider's discretion.      For prescription refill requests, have your pharmacy contact our office and allow 72 hours for refills to be completed.    Today you received the following chemotherapy and/or immunotherapy agents Taxol/Carboplatin      To help prevent nausea and vomiting after your treatment, we encourage you to take your nausea medication as directed.  BELOW ARE SYMPTOMS THAT SHOULD BE REPORTED IMMEDIATELY: *FEVER GREATER THAN 100.4 F (38 C) OR HIGHER *CHILLS OR SWEATING *NAUSEA AND VOMITING THAT IS NOT CONTROLLED WITH YOUR NAUSEA MEDICATION *UNUSUAL SHORTNESS OF BREATH *UNUSUAL BRUISING OR BLEEDING *URINARY PROBLEMS (pain or burning when urinating, or frequent urination) *BOWEL PROBLEMS (unusual diarrhea, constipation, pain near the anus) TENDERNESS IN MOUTH AND THROAT WITH OR WITHOUT PRESENCE OF ULCERS (sore throat, sores in mouth, or a toothache) UNUSUAL RASH, SWELLING OR PAIN  UNUSUAL VAGINAL DISCHARGE OR ITCHING   Items with * indicate a potential emergency and should be  followed up as soon as possible or go to the Emergency Department if any problems should occur.  Please show the CHEMOTHERAPY ALERT CARD or IMMUNOTHERAPY ALERT CARD at check-in to the Emergency Department and triage nurse.  Should you have questions after your visit or need to cancel or reschedule your appointment, please contact [facility] [place] - A DEPT OF Eligha Bridegroom [facility] [phone]  and follow the prompts.  Office hours are 8:00 a.m. to 4:30 p.m. Monday - Friday. Please note that voicemails left after 4:00 p.m. may not be returned until the following business day.  We are closed weekends and major holidays. You have access to a nurse at all times for urgent questions. Please call the main number to the clinic [phone] and follow the prompts.  For any non-urgent questions, you may also contact your provider using MyChart. We now offer e-Visits for anyone 45 and older to request care online for non-urgent symptoms. For details visit [email or web address].   Also download the MyChart app! Go to the app store, search "MyChart", open the app, select [place], and log in with your MyChart username and password.

## 2023-01-18 ENCOUNTER — Encounter: Payer: Medicare Other | Admitting: Gynecologic Oncology

## 2023-01-18 ENCOUNTER — Ambulatory Visit: Payer: Medicare Other

## 2023-01-18 LAB — CA 125: Cancer Antigen (CA) 125: 974 U/mL — ABNORMAL HIGH (ref 0.0–38.1)

## 2023-01-24 ENCOUNTER — Ambulatory Visit: Payer: Medicare Other | Admitting: Obstetrics and Gynecology

## 2023-01-24 ENCOUNTER — Other Ambulatory Visit (HOSPITAL_COMMUNITY): Payer: Self-pay | Admitting: Nurse Practitioner

## 2023-01-24 DIAGNOSIS — Z433 Encounter for attention to colostomy: Secondary | ICD-10-CM | POA: Insufficient documentation

## 2023-01-24 DIAGNOSIS — T148XXA Other injury of unspecified body region, initial encounter: Secondary | ICD-10-CM | POA: Insufficient documentation

## 2023-01-24 DIAGNOSIS — L24B3 Irritant contact dermatitis related to fecal or urinary stoma or fistula: Secondary | ICD-10-CM | POA: Insufficient documentation

## 2023-01-25 DIAGNOSIS — Z9049 Acquired absence of other specified parts of digestive tract: Secondary | ICD-10-CM | POA: Diagnosis not present

## 2023-01-25 DIAGNOSIS — Z433 Encounter for attention to colostomy: Secondary | ICD-10-CM | POA: Diagnosis not present

## 2023-01-25 DIAGNOSIS — Z483 Aftercare following surgery for neoplasm: Secondary | ICD-10-CM | POA: Diagnosis not present

## 2023-01-25 DIAGNOSIS — K682 Retroperitoneal fibrosis: Secondary | ICD-10-CM | POA: Diagnosis not present

## 2023-01-25 DIAGNOSIS — Z7901 Long term (current) use of anticoagulants: Secondary | ICD-10-CM | POA: Diagnosis not present

## 2023-01-25 DIAGNOSIS — Z452 Encounter for adjustment and management of vascular access device: Secondary | ICD-10-CM | POA: Diagnosis not present

## 2023-01-28 ENCOUNTER — Telehealth: Payer: Self-pay | Admitting: *Deleted

## 2023-01-28 NOTE — Telephone Encounter (Signed)
Spoke with Rachel Carlson who called the office in regards to questions about her ostomy. Pt states the waist band of her pants cuts right below the ostomy and it blocks off half the pouch. Pt advised to roll her pants down and or try wearing looser pants and longer shirts to allow the pouch to be on the outside of her pants. Pt would like to try using an ostomy support belt that would keep the bag in place. Pt was given some options and advised to follow up with outpatient ostomy clinic at [name] [phone]. for advice and or local medical supply store.   Patient also concerned about the urgency to have a bowel movement almost on a daily basis. Pt states she has even passed "mucous balls" from rectum. Advised patient this is normal and often can happen.  Pt thanked the office for the advice and instructed to call with any questions or concerns.

## 2023-01-29 ENCOUNTER — Telehealth: Payer: Self-pay | Admitting: Oncology

## 2023-01-29 NOTE — Telephone Encounter (Signed)
 Rachel Carlson called and said she is trying to order ostomy supplies from [facility] but needs to have a prescription sent before she can order.  Advised her that per Darice Cooley, FNP's note, she will send a prescription when home health has been discharged.  Rachel Carlson said she has asked her home health nurse for supplies and they do not have any.  She does have enough supplies for another week. Advised her to try calling the ostomy clinic for assistance and that I will also send a note to Darice Cooley, FNP to see if she can send the prescription.

## 2023-01-29 NOTE — Telephone Encounter (Signed)
 Called Rachel Carlson back and let her know that everything has been sent to [facility] but that she needs to be discharged from [facility] before she can order supplies.  Rachel Carlson said that the Home Health nurse is coming to see her on Friday so she will ask then about getting supplies.

## 2023-01-31 ENCOUNTER — Ambulatory Visit: Payer: Medicare Other | Admitting: Hematology

## 2023-01-31 ENCOUNTER — Other Ambulatory Visit: Payer: Medicare Other

## 2023-01-31 ENCOUNTER — Ambulatory Visit: Payer: Medicare Other

## 2023-01-31 ENCOUNTER — Telehealth: Payer: Self-pay | Admitting: *Deleted

## 2023-01-31 NOTE — Telephone Encounter (Signed)
-----   Message from [place] JONELLE Dollar sent at 01/30/2023  2:58 PM EST ----- Regarding: Call 12/2 [place], This patient called into the nurse line today with edema versus prolapse of her stoma. Small area of what she described as hemorrhage underneath the skin in one area. She denies any change to her stool output. Notes that stool has been a little bit on the looser side the last two days. Denies any nausea, no change in appetite. We discussed adding something for bowel regimen as chemotherapy can cause constipation. Would you please call her tomorrow to check in? If she is still worried about how it looks, we can schedule her for an appointment with Melissa on Friday afternoon. Thank you, Kat

## 2023-01-31 NOTE — Telephone Encounter (Signed)
 Spoke with Rachel Carlson and patient states her stoma is still swollen and pink in color. Pt states it's not anymore swollen than yesterday. She denies fever, chills or pain. Her ostomy has loose stool, and she is not having any nausea or vomiting. Pt is taking daily miralax  to prevent constipation. Advised patient if she is concerned about how stoma is looking we can schedule her appt. Tomorrow with nurse practitioner.  Pt states she would rather wait and see how it looks in the morning. Pt also advised to call the outpatient ostomy clinic tomorrow, they are open from 9-1pm on Fridays. Advised patient the office will call tomorrow morning to check in. Pt thanked the office for calling.

## 2023-02-01 ENCOUNTER — Encounter: Payer: Self-pay | Admitting: Hematology

## 2023-02-01 NOTE — Telephone Encounter (Signed)
 Spoke with Rachel Carlson and she states the swelling of her stoma has stayed the same. Pt denies pain, fever and or chills. States her ostomy is producing stool but not as much as it had been. Advised patient to continue with the miralax  as prescribed. Pt also spoke with [facility] but didn't mention her stoma swelling. Offered patient an appointment but she declined at this time and states she will call the office early next week to let us  know.

## 2023-02-05 ENCOUNTER — Ambulatory Visit (HOSPITAL_COMMUNITY)
Admission: RE | Admit: 2023-02-05 | Discharge: 2023-02-05 | Disposition: A | Discharge status: Home / Self Care | Payer: Medicare Other | Source: Ambulatory Visit | Attending: Plastic Surgery | Admitting: Plastic Surgery

## 2023-02-05 DIAGNOSIS — Z933 Colostomy status: Secondary | ICD-10-CM | POA: Diagnosis not present

## 2023-02-05 DIAGNOSIS — Z433 Encounter for attention to colostomy: Secondary | ICD-10-CM

## 2023-02-05 NOTE — Discharge Instructions (Signed)
 Patient was seen in ostomy clinic 02/04/22  She was accompanied by her son.  She cannot drive herself to appointments.  Mike Gip, FNP-BC [place] [phone]

## 2023-02-05 NOTE — Progress Notes (Signed)
 [facility]   Reason for visit:  LUQ colostomy HPI:  Peritoneal carcinoma with resection and colostomy 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)    Esophageal stricture    Family history of breast cancer    Family history of colon cancer    Family history of melanoma    Family history of prostate cancer    Fibroids 1998   uterine, history of (left ovaries)   GERD (gastroesophageal reflux disease)    History of hiatal hernia    History of uterine prolapse    Hx of adenomatous polyp of colon 10/19/2014   Hyperlipidemia    Hypertension    borderline no meds diet and exercise   Neuromuscular disorder (HCC)    neuropathy from chemo   Obesity    Ovarian cancer (HCC)    PVC (premature ventricular contraction)    first dx by holter in 1980's, echo (6/10) EF 55-60%, normal diastolic fxn, normal size RV and fxn, mild MR, PASP   Squamous cell carcinoma of arm    ovarian   Tubal pregnancy    Ulcerative proctosigmoiditis (HCC)    Urge urinary incontinence 06/03/2007   Qualifier: Diagnosis of  By: Cecilie CMA, NANNIE Ivy     Family History  Problem Relation Age of Onset   Sudden death Father 27       coronary arteriosclerosis on death certificate   Heart attack Mother 78   Transient ischemic attack Mother    Diabetes type II Mother    Sudden death Mother        died age 84   Diabetes Mother    Coronary artery disease Mother    Skin cancer Mother    Barrett's esophagus Mother    Arrhythmia Brother    Colon cancer Brother 13   Breast cancer Sister        dx in her 31s   Colon polyps Sister        adenomatous   Colon cancer Son 37       lynch syndrome   Melanoma Maternal Uncle    Congestive Heart Failure Maternal Grandmother    Prostate cancer Maternal Grandfather        dx in 93s   Tuberculosis Paternal Grandfather     Skin cancer Maternal Aunt    Barrett's esophagus Maternal Aunt    Other Son        parotid gland tumor   Ehlers-Danlos syndrome Daughter    Colon cancer Cousin        mid 53s; maternal cousin   Colon cancer Other        MGMs brother   Esophageal cancer Neg Hx    Stomach cancer Neg Hx    Rectal cancer Neg Hx    Allergies  Allergen Reactions   Aspartame     Intolerance to artifical sweeteners, pt states caused burning of bladder   Levaquin  [Levofloxacin ] Nausea Only   Pantoprazole  Sodium Diarrhea    Abdominal pain   Ranitidine Diarrhea    abdominal pain   Omeprazole  Diarrhea    Abdominal pain   Sulfasalazine  Rash and Other (See Comments)    Fever, chills, headache, muscle pain also   Current Outpatient Medications  Medication Sig Dispense Refill Last Dose/Taking   apixaban  (ELIQUIS ) 5 MG TABS tablet Take 1 tablet (  5 mg total) by mouth 2 (two) times daily. 180 tablet 0    Ascorbic Acid  (VITAMIN C ) 1000 MG tablet Take 1,000 mg by mouth daily.      calcium  citrate-vitamin D  (CITRACAL+D) 315-200 MG-UNIT per tablet Take 1 tablet by mouth 2 (two) times daily.      CARBOPLATIN  IV Inject into the vein every 21 ( twenty-one) days. port      Cholecalciferol (VITAMIN D -3) 5000 UNITS TABS Take 5,000 Units by mouth daily.      ciprofloxacin  (CIPRO ) 500 MG tablet Take 1 tablet (500 mg total) by mouth 2 (two) times daily. 20 tablet 0    Coenzyme Q10 (CO Q-10) 200 MG CAPS Take 200 mg by mouth daily.      CRANBERRY PO Take 650 mg by mouth daily.      Glucosamine-Chondroitin (GLUCOSAMINE CHONDR COMPLEX PO) Take 2 capsules by mouth daily.      GRAPE SEED EXTRACT PO Take 1 capsule by mouth daily.      ketoconazole  (NIZORAL ) 2 % cream Apply 1 Application topically daily. To yeast rash 30 g 1    lansoprazole  (PREVACID ) 15 MG capsule Take 1 capsule (15 mg total) by mouth 2 (two) times daily before a meal.      lidocaine -prilocaine  (EMLA ) cream Apply to affected area once (Patient taking differently:  Apply 1 Application topically daily as needed (prior to port access).) 30 g 3    milk thistle 175 MG tablet Take 175 mg by mouth daily.      mirtazapine  (REMERON ) 15 MG tablet Take 1 tablet (15 mg total) by mouth at bedtime. 30 tablet 4    Omega-3 Fatty Acids (FISH OIL TRIPLE STRENGTH PO) Take 1 capsule by mouth daily.      ondansetron  (ZOFRAN ) 8 MG tablet Take 1 tablet (8 mg total) by mouth every 8 (eight) hours as needed for nausea or vomiting. Start on the third day after carboplatin . 30 tablet 1    PACLITAXEL  IV Inject into the vein every 21 ( twenty-one) days. port      phenazopyridine  (PYRIDIUM ) 200 MG tablet Take 1 tablet (200 mg total) by mouth 3 (three) times daily as needed for pain. 10 tablet 0    Polyethyl Glycol-Propyl Glycol (SYSTANE OP) Place 1 drop into both eyes 4 (four) times daily as needed (for dryness).      PREVACID  24HR 15 MG capsule Take 15 mg by mouth See admin instructions. Take 15 mg by mouth in the morning before any food and an additional 15 mg once a day as needed for unresolved reflux      prochlorperazine  (COMPAZINE ) 10 MG tablet Take 10 mg by mouth every 6 (six) hours as needed for nausea or vomiting.      rosuvastatin  (CRESTOR ) 20 MG tablet Take 1 tablet (20 mg total) by mouth at bedtime. 90 tablet 3    simethicone  (MYLICON) 80 MG chewable tablet Chew 1 tablet (80 mg total) by mouth 4 (four) times daily as needed for flatulence. 30 tablet 4    traMADol  (ULTRAM ) 50 MG tablet Take 1 tablet (50 mg total) by mouth every 6 (six) hours as needed for severe pain (pain score 7-10). For AFTER surgery only, do not take and drive 15 tablet 0    traZODone  (DESYREL ) 50 MG tablet Take 1 tablet (50 mg total) by mouth at bedtime as needed for sleep. 30 tablet 1    vitamin B-12 (CYANOCOBALAMIN ) 1000 MCG tablet Take 1,000 mcg by mouth daily.  zinc gluconate 50 MG tablet Take 50 mg by mouth daily.      Current Facility-Administered Medications  Medication Dose Route Frequency  Provider Last Rate Last Admin   botulinum toxin Type A  (BOTOX ) injection 200 Units  200 Units Intramuscular Once        ROS  Review of Systems  Constitutional:  Positive for fatigue.  Gastrointestinal:        LUQ colostomy Midline incision, dehiscence.  Healing  Musculoskeletal:  Positive for myalgias.  Skin:  Positive for color change.  Allergic/Immunologic: Positive for immunocompromised state.  Psychiatric/Behavioral: Negative.    All other systems reviewed and are negative.  Vital signs:  LMP 05/29/1996  Exam:  Physical Exam Constitutional:      Appearance: Normal appearance.  Cardiovascular:     Rate and Rhythm: Normal rate and regular rhythm.  Abdominal:     Palpations: Abdomen is soft.  Skin:    General: Skin is warm and dry.     Findings: Erythema present.     Comments: Peristomal redness  Neurological:     Mental Status: She is alert and oriented to person, place, and time. Mental status is at baseline.  Psychiatric:        Mood and Affect: Mood normal.        Behavior: Behavior normal.     Stoma type/location:  LUQ colostomy Stomal assessment/size:  1 1/2 budded Peristomal assessment:  intact Treatment options for stomal/peristomal skin: barrier ring and 1 piece convex pouch Output: soft brown stool Ostomy pouching: 1pc. convex Education provided:  Is doing well with pouch changes.  HH is discharged. I sent her prescription to [facility] to establish mail order supplies.  She was confused and told them she was not aware of an order.  I explained that I had sent that order and she needed to call [facility] and clarify to get her supplies ordered.  I provided her with the phone number  I also provided her with ostomy supplies today.     Impression/dx  colostomy Discussion  See above Pouching going well.  Getting established with [facility]. [phone] Plan  See back as needed.  Call clinic with any further supply issues.     Visit time: 45 minutes.    Darice Cooley FNP-BC

## 2023-02-07 ENCOUNTER — Other Ambulatory Visit: Payer: Self-pay

## 2023-02-07 ENCOUNTER — Inpatient Hospital Stay: Payer: Medicare Other | Attending: Genetic Counselor | Admitting: Genetic Counselor

## 2023-02-07 ENCOUNTER — Inpatient Hospital Stay: Payer: Medicare Other

## 2023-02-07 DIAGNOSIS — Z800 Family history of malignant neoplasm of digestive organs: Secondary | ICD-10-CM

## 2023-02-07 DIAGNOSIS — R110 Nausea: Secondary | ICD-10-CM | POA: Insufficient documentation

## 2023-02-07 DIAGNOSIS — D509 Iron deficiency anemia, unspecified: Secondary | ICD-10-CM | POA: Insufficient documentation

## 2023-02-07 DIAGNOSIS — Z90722 Acquired absence of ovaries, bilateral: Secondary | ICD-10-CM | POA: Insufficient documentation

## 2023-02-07 DIAGNOSIS — C482 Malignant neoplasm of peritoneum, unspecified: Secondary | ICD-10-CM | POA: Insufficient documentation

## 2023-02-07 DIAGNOSIS — Z808 Family history of malignant neoplasm of other organs or systems: Secondary | ICD-10-CM | POA: Diagnosis not present

## 2023-02-07 DIAGNOSIS — C786 Secondary malignant neoplasm of retroperitoneum and peritoneum: Secondary | ICD-10-CM | POA: Diagnosis not present

## 2023-02-07 DIAGNOSIS — Z803 Family history of malignant neoplasm of breast: Secondary | ICD-10-CM

## 2023-02-07 DIAGNOSIS — Z86718 Personal history of other venous thrombosis and embolism: Secondary | ICD-10-CM | POA: Insufficient documentation

## 2023-02-07 DIAGNOSIS — Z8042 Family history of malignant neoplasm of prostate: Secondary | ICD-10-CM | POA: Diagnosis not present

## 2023-02-07 DIAGNOSIS — Z801 Family history of malignant neoplasm of trachea, bronchus and lung: Secondary | ICD-10-CM | POA: Insufficient documentation

## 2023-02-07 DIAGNOSIS — Z90710 Acquired absence of both cervix and uterus: Secondary | ICD-10-CM | POA: Insufficient documentation

## 2023-02-07 DIAGNOSIS — Z7901 Long term (current) use of anticoagulants: Secondary | ICD-10-CM | POA: Insufficient documentation

## 2023-02-07 DIAGNOSIS — Z5111 Encounter for antineoplastic chemotherapy: Secondary | ICD-10-CM | POA: Insufficient documentation

## 2023-02-07 DIAGNOSIS — Z807 Family history of other malignant neoplasms of lymphoid, hematopoietic and related tissues: Secondary | ICD-10-CM | POA: Insufficient documentation

## 2023-02-07 DIAGNOSIS — G629 Polyneuropathy, unspecified: Secondary | ICD-10-CM | POA: Insufficient documentation

## 2023-02-07 DIAGNOSIS — Z85828 Personal history of other malignant neoplasm of skin: Secondary | ICD-10-CM | POA: Insufficient documentation

## 2023-02-07 NOTE — Progress Notes (Signed)
 REFERRING PROVIDER: Rogers Hai, MD [address] [place],  [place] [address]  PRIMARY PROVIDER:  Randeen Laine LABOR, MD  PRIMARY REASON FOR VISIT:  1. Peritoneal carcinomatosis (HCC)   2. Family history of breast cancer   3. Family history of colon cancer   4. Family history of prostate cancer   5. Family history of melanoma   6. Family history of Lynch syndrome - son      HISTORY OF PRESENT ILLNESS:   Rachel Carlson, a 78 y.o. female, was seen for a [place] cancer genetics consultation at the request of Dr. Rogers due to a personal and family history of cancer.  [name] presents to clinic today to discuss the possibility of a hereditary predisposition to cancer, genetic testing, and to further clarify her future cancer risks, as well as potential cancer risks for family members.   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  She has a strong family history of cancer, with her son reportedly having genetic testing that indicates he has Lynch syndrome. This report has not been verified.  The patient had negative genetic testing in 2019, but with her recent diagnosis of peritoneal cancer and the updated technology in genetic testing, she wants to repeat her testing.    CANCER HISTORY:  Oncology History Overview Note  CARIS: FOLR1 2+ (75%) ER(2+) HRD-, PDL1 CPS 5%, p53 mutation No mutation in BRCA1/2, ATM, ERBB2, KRAS PR negative   Carcinomatosis (HCC)  08/31/2022 Initial Diagnosis   Carcinomatosis (HCC)   09/12/2022 -  Chemotherapy   Patient is on Treatment Plan : OVARIAN Carboplatin  (AUC 6) + Paclitaxel  (175) q21d X 6 Cycles     Gynecologic malignancy (HCC)  12/12/2022 Initial Diagnosis   Gynecologic malignancy (HCC)      RISK FACTORS:  Menarche was at age 61.  First live birth at age 82.  OCP use for approximately 0 years.  Ovaries intact: yes.  Hysterectomy: yes.  Menopausal status: postmenopausal.  HRT use: 0  years. Colonoscopy: yes; 2-3 polyps. Mammogram within the last year: yes. Number of breast biopsies: 1. Up to date with pelvic exams:  no. Any excessive radiation exposure in the past:  no  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)    Esophageal stricture    Family history of breast cancer    Family history of colon cancer    Family history of melanoma    Family history of prostate cancer    Fibroids 1998   uterine, history of (left ovaries)   GERD (gastroesophageal reflux disease)    History of hiatal hernia    History of uterine prolapse    Hx of adenomatous polyp of colon 10/19/2014   Hyperlipidemia    Hypertension    borderline no meds diet and exercise   Neuromuscular disorder (HCC)    neuropathy from chemo   Obesity    Ovarian cancer (HCC)    PVC (premature ventricular contraction)    first dx by holter in 1980's, echo (6/10) EF 55-60%, normal diastolic fxn, normal size RV and fxn, mild MR, PASP   Squamous cell carcinoma of arm    ovarian   Tubal pregnancy    Ulcerative proctosigmoiditis (HCC)    Urge urinary incontinence 06/03/2007   Qualifier: Diagnosis of  By: Cecilie  CMA, NANNIE Ivy      Past Surgical History:  Procedure Laterality Date   BILATERAL SALPINGECTOMY Bilateral 12/12/2022   Procedure: OPEN BILATERAL SALPINGECTOMY-OOPHORECTOMY;  Surgeon: Viktoria Comer SAUNDERS, MD;  Location: [facility];  Service: Gynecology;  Laterality: Bilateral;   BLADDER NECK SUSPENSION  1998   BREAST BIOPSY Left    BREAST LUMPECTOMY Left    benign   CATARACT EXTRACTION Right 01/2020   CATARACT EXTRACTION W/PHACO Left 08/12/2020   Procedure: CATARACT EXTRACTION PHACO AND INTRAOCULAR LENS PLACEMENT LEFT EYE;  Surgeon: Harrie Agent, MD;  Location: [facility];  Service: Ophthalmology;  Laterality: Left;  left CDE=10.71   COLONOSCOPY  05/18/2011/10/12/14    Dr. Lupita Commander   DEBULKING N/A 12/12/2022   Procedure: RADICAL TUMOR DEBULKING, PERITONEAL STRIPPING, MOBILIZATION SPLENIC FLEXURE, RECTOSIGMOID RESECTION,SMALL BOWEL RESECTION AND REANASTOMOSIS ,END TO END DESCENDING COLOSTOMY;  Surgeon: Viktoria Comer SAUNDERS, MD;  Location: [facility];  Service: Gynecology;  Laterality: N/A;   ECTOPIC PREGNANCY SURGERY  1980   ESOPHAGOGASTRODUODENOSCOPY (EGD) WITH ESOPHAGEAL DILATION  04/03/2012   Dr. Lupita Commander   INCISION AND DRAINAGE ABSCESS N/A 01/02/2023   Procedure: INCISION AND DRAINAGE OF ABDOMINAL WOUND SEROMA;  Surgeon: Rogelio Planas, MD;  Location: [facility];  Service: Gynecology;  Laterality: N/A;   IR IMAGING GUIDED PORT INSERTION  09/06/2022   OMENTECTOMY N/A 12/12/2022   Procedure: OMENTECTOMY;  Surgeon: Viktoria Comer SAUNDERS, MD;  Location: [facility];  Service: Gynecology;  Laterality: N/A;   PARTIAL HYSTERECTOMY     abdominal, fibroids, and prolapse (1998) bladder tack   SALPINGOOPHORECTOMY Bilateral 12/12/2022   Procedure: DIAGNOSTIC LAPAROSCOPY;  Surgeon: Viktoria Comer SAUNDERS, MD;  Location: [facility];  Service: Gynecology;  Laterality: Bilateral;   SKIN CANCER EXCISION     UPPER GASTROINTESTINAL ENDOSCOPY      Social History   Socioeconomic History   Marital status: Married    Spouse name: Not on file   Number of children: 3   Years of education: Not on file   Highest education level: Not on file  Occupational History   Occupation: international aid/development worker    Comment: retired 2009    Employer: unemployed  Tobacco Use   Smoking status: Never   Smokeless tobacco: Never  Vaping Use   Vaping status: Never Used  Substance and Sexual Activity   Alcohol  use: No    Alcohol /week: 0.0 standard drinks of alcohol     Comment: rarely beer   Drug use: No   Sexual activity: Not Currently  Other Topics Concern   Not on file  Social History Narrative   Retired international aid/development worker, 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)   Grown children   Never smoker no  alcohol  tobacco or drug use   Social Drivers of Corporate Investment Banker Strain: Low Risk  (08/28/2022)   Overall Financial Resource Strain (CARDIA)    Difficulty of Paying Living Expenses: Not hard at all  Food Insecurity: No Food Insecurity (01/10/2023)   Hunger Vital Sign    Worried About Running Out of Food in the Last Year: Never true    Ran Out of Food in the Last Year: Never true  Transportation Needs: No Transportation Needs (01/10/2023)   PRAPARE - Administrator, Civil Service (Medical): No    Lack of Transportation (Non-Medical): No  Physical Activity: Inactive (08/28/2022)   Exercise Vital Sign    Days of Exercise per Week: 0 days    Minutes of Exercise per Session: 0 min  Stress: No  Stress Concern Present (08/28/2022)   [employer] of Occupational Health - Occupational Stress Questionnaire    Feeling of Stress : Not at all  Social Connections: Moderately Integrated (08/28/2022)   Social Connection and Isolation Panel [NHANES]    Frequency of Communication with Friends and Family: More than three times a week    Frequency of Social Gatherings with Friends and Family: More than three times a week    Attends Religious Services: Never    Database Administrator or Organizations: Yes    Attends Engineer, Structural: More than 4 times per year    Marital Status: Married     FAMILY HISTORY:  We obtained a detailed, 4-generation family history.  Significant diagnoses are listed below: Family History  Problem Relation Age of Onset   Sudden death Father 73       coronary arteriosclerosis on death certificate   Heart attack Mother 61   Transient ischemic attack Mother    Diabetes type II Mother    Sudden death Mother        died age 56   Diabetes Mother    Coronary artery disease Mother    Skin cancer Mother    Barrett's esophagus Mother    Arrhythmia Brother    Colon cancer Brother 22   Breast cancer Sister        dx in her 65s   Colon  polyps Sister        adenomatous   Colon cancer Son 53       lynch syndrome   Melanoma Maternal Uncle    Congestive Heart Failure Maternal Grandmother    Prostate cancer Maternal Grandfather        dx in 21s   Tuberculosis Paternal Grandfather    Skin cancer Maternal Aunt    Barrett's esophagus Maternal Aunt    Other Son        parotid gland tumor   Ehlers-Danlos syndrome Daughter    Colon cancer Cousin        mid 55s; maternal cousin   Colon cancer Other        MGMs brother   Esophageal cancer Neg Hx    Stomach cancer Neg Hx    Rectal cancer Neg Hx      The patient has two sons and a daughter who are cancer free.  One son had colon cancer at 108 and reportedly has a diagnosis of Lynch syndrome.  A second son had a parotid gland tumor and has had negative genetic testing.  Her daughter has a diagnosis of EDS.  She has one sister and two brothers.  Her sister had breast cancer and her sisters daughter had an unknown cancer.  One brother had colon cancer in his 51's and two different types of lymphoma.  Her parents are both deceased.  The patient's mother had skin cancer.  She had one brother and two sisters.  The brother had melanoma and one sister had lung cancer.  Another sister had a daughter with colon cancer in her 39's.  The maternal grandparents are deceased.  The grandfather had prostate cancer.  The patient's father died of a heart attack at 27.  He had two brothers who were cancer free.  The paternal grandparents died of heart disease, but her father's maternal uncle had colon cancer.  Ms. Lepp is unaware of previous family history of genetic testing for hereditary cancer risks other than what is mentioned above. There is no reported Ashkenazi  Jewish ancestry. There is no known consanguinity.  GENETIC COUNSELING ASSESSMENT: Ms. Tenorio is a 78 y.o. female with a personal and family history of cancer which is somewhat suggestive of a hereditary cancer syndrome and  predisposition to cancer given young ages of onset of colon cancer and the combination of cancer in the family. We, therefore, discussed and recommended the following at today's visit.   DISCUSSION: We discussed that, in general, most cancer is not inherited in families, but instead is sporadic or familial. Sporadic cancers occur by chance and typically happen at older ages (>50 years) as this type of cancer is caused by genetic changes acquired during an individual's lifetime. Some families have more cancers than would be expected by chance; however, the ages or types of cancer are not consistent with a known genetic mutation or known genetic mutations have been ruled out. This type of familial cancer is thought to be due to a combination of multiple genetic, environmental, hormonal, and lifestyle factors. While this combination of factors likely increases the risk of cancer, the exact source of this risk is not currently identifiable or testable.  We discussed that 5 - 10% of cancer is hereditary, with most cases peritoneal/ovarian cancer associated with BRCA mutations or even Lynch syndrome.  There are other genes that can be associated with hereditary peritoneal/ovarian cancer syndromes.  These include BRIP1, RAD51C, RAD51D and a few others.  Ms. Herbert had negative genetic testing in 2019, however since that time new technology adding RNA testing onto the DNA testing has identified patients who were missed in the past.  Additionally, having a diagnosis of peritoneal cancer suggests that there is something could have been missed and it would be good to repeat.  We discussed that testing is beneficial for several reasons including knowing how to follow individuals after completing their treatment, identifying whether potential treatment options such as PARP inhibitors would be beneficial, and understand if other family members could be at risk for cancer and allow them to undergo genetic testing.   We  reviewed the characteristics, features and inheritance patterns of hereditary cancer syndromes. We also discussed genetic testing, including the appropriate family members to test, the process of testing, insurance coverage and turn-around-time for results. We discussed the implications of a negative, positive, carrier and/or variant of uncertain significant result. Ms. Mckinlay  was offered a common hereditary cancer panel (36+ genes) and an expanded pan-cancer panel (70+ genes). Ms. Veenstra was informed of the benefits and limitations of each panel, including that expanded pan-cancer panels contain genes that do not have clear management guidelines at this point in time.  We also discussed that as the number of genes included on a panel increases, the chances of variants of uncertain significance increases. Ms. Cerullo decided to pursue genetic testing for the Multi-cancer + RNA gene panel.   The Multi-Cancer + RNA Panel offered by [facility] includes sequencing and/or deletion/duplication analysis of the following 70 genes:  AIP*, ALK, APC*, ATM*, AXIN2*, BAP1*, BARD1*, BLM*, BMPR1A*, BRCA1*, BRCA2*, BRIP1*, CDC73*, CDH1*, CDK4, CDKN1B*, CDKN2A, CHEK2*, CTNNA1*, DICER1*, EPCAM (del/dup only), EGFR, FH*, FLCN*, GREM1 (promoter dup only), HOXB13, KIT, LZTR1, MAX*, MBD4, MEN1*, MET, MITF, MLH1*, MSH2*, MSH3*, MSH6*, MUTYH*, NF1*, NF2*, NTHL1*, PALB2*, PDGFRA, PMS2*, POLD1*, POLE*, POT1*, PRKAR1A*, PTCH1*, PTEN*, RAD51C*, RAD51D*, RB1*, RET, SDHA* (sequencing only), SDHAF2*, SDHB*, SDHC*, SDHD*, SMAD4*, SMARCA4*, SMARCB1*, SMARCE1*, STK11*, SUFU*, TMEM127*, TP53*, TSC1*, TSC2*, VHL*. RNA analysis is performed for * genes.   Based on Ms. Christoffersen's personal and family  history of cancer, she meets medical criteria for genetic testing. Despite that she meets criteria, she may still have an out of pocket cost. We discussed that if her out of pocket cost for testing is over $100, the laboratory will call and  confirm whether she wants to proceed with testing.  If the out of pocket cost of testing is less than $100 she will be billed by the genetic testing laboratory.   We discussed that some people do not want to undergo genetic testing due to fear of genetic discrimination.  The Genetic Information Nondiscrimination Act (GINA) was signed into federal law in 2008. GINA prohibits health insurers and most employers from discriminating against individuals based on genetic information (including the results of genetic tests and family history information). According to GINA, health insurance companies cannot consider genetic information to be a preexisting condition, nor can they use it to make decisions regarding coverage or rates. GINA also makes it illegal for most employers to use genetic information in making decisions about hiring, firing, promotion, or terms of employment. It is important to note that GINA does not offer protections for life insurance, disability insurance, or long-term care insurance. GINA does not apply to those in the [employer], those who work for companies with less than 15 employees, and new life insurance or long-term disability insurance policies.  Health status due to a cancer diagnosis is not protected under GINA. More information about GINA can be found by visiting [email or web address].be.  PLAN: After considering the risks, benefits, and limitations, Ms. Crocket provided informed consent to pursue genetic testing and the blood sample was sent to [facility] for analysis of the Multi-cancer+RNA. Results should be available within approximately 2-3 weeks' time, at which point they will be disclosed by telephone to Ms. Milner, as will any additional recommendations warranted by these results. Ms. Harkless will receive a summary of her genetic counseling visit and a copy of her results once available. This information will also be available in Epic.   Based on Ms. Chanthavong's  family history, we recommended her son Mose, who was diagnosed with colon cancer at age 61, have genetic counseling and testing if it has not been performed in the past, or if results are not able to be obtained. Ms. Ilg will let us  know if we can be of any assistance in coordinating genetic counseling and/or testing for this family member.   Lastly, we encouraged Ms. Sturgell to remain in contact with cancer genetics annually so that we can continuously update the family history and inform her of any changes in cancer genetics and testing that may be of benefit for this family.   Ms. Canby questions were answered to her satisfaction today. Our contact information was provided should additional questions or concerns arise. Thank you for the referral and allowing us  to share in the care of your patient.   Nylia Gavina P. Perri, MS, CGC Licensed, Patent Attorney [email or web address] phone: [phone]  80 minutes were spent on the date of the encounter in service to the patient including preparation, face-to-face consultation, documentation and care coordination.  The patient was seen alone.  Drs. Lanny Stalls, and/or Gudena were available for questions, if needed..    _______________________________________________________________________ For Office Staff:  Number of people involved in session: 1 Was an Intern/ student involved with case: no

## 2023-02-08 ENCOUNTER — Encounter: Payer: Self-pay | Admitting: Hematology

## 2023-02-08 DIAGNOSIS — Z452 Encounter for adjustment and management of vascular access device: Secondary | ICD-10-CM | POA: Diagnosis not present

## 2023-02-08 DIAGNOSIS — Z483 Aftercare following surgery for neoplasm: Secondary | ICD-10-CM | POA: Diagnosis not present

## 2023-02-08 DIAGNOSIS — Z433 Encounter for attention to colostomy: Secondary | ICD-10-CM | POA: Diagnosis not present

## 2023-02-08 DIAGNOSIS — Z7901 Long term (current) use of anticoagulants: Secondary | ICD-10-CM | POA: Diagnosis not present

## 2023-02-08 DIAGNOSIS — K682 Retroperitoneal fibrosis: Secondary | ICD-10-CM | POA: Diagnosis not present

## 2023-02-08 DIAGNOSIS — Z9049 Acquired absence of other specified parts of digestive tract: Secondary | ICD-10-CM | POA: Diagnosis not present

## 2023-02-10 NOTE — Progress Notes (Signed)
 [facility] [place] [address] [place] [address]    Clinic Day:  02/11/2023  Referring physician: Tower, Laine LABOR, MD  Patient Care Team: Tower, Laine LABOR, MD as PCP - General Rogers Hai, MD as Medical Oncologist (Medical Oncology) Celestia Joesph SQUIBB, RN as Oncology Nurse Navigator (Medical Oncology)   ASSESSMENT & PLAN:   Assessment: 1.  Peritoneal carcinomatosis/ovarian cancer: - Presentation: 77-month history of abdominal distention and bloating. - Last colonoscopy (01/01/2020): No evidence of malignancy. - CTAP (08/23/2022): Moderate ascites, probable omental caking in the left lower quadrant, possible small peritoneal implants seen in the pelvis.  Diverticulosis of descending and sigmoid colon with moderate wall thickening of the distal sigmoid colon.  Small bilateral pleural effusions. - 08/26/2022: CA125: 7356, CEA: 1 - Germline mutation testing (09/16/2017): Negative. - 4 cycles of carboplatin  and paclitaxel  from 09/12/2022 through 11/14/2022 - 12/12/2022: Tumor debulking surgery including total omentectomy, resection of peritoneal disease, small bowel resection with reanastomosis, en bloc resection of the left fallopian tube and ovary as well as rectosigmoid colon, left ureterolysis, right salpingo-oophorectomy, end descending colostomy by Dr. Viktoria - Pathology: Omental resection-high-grade serous carcinoma, cecal implant-high-grade serous carcinoma, ileal resection-mesenteric and serosal involvement by high-grade serous carcinoma, left lower abdominal wall nodule-high-grade serous carcinoma, right tube and ovary resection-high-grade serous carcinoma involving serosa, bladder peritoneum and rectosigmoid colon-high-grade serous carcinoma involving colonic wall and extensive involvement of the serosa and mesenteric adipose tissue.  Metastatic carcinoma involving 8/13 nodes.  pT3 cpN1a. - NGS: MSI-stable, TMB-low, HRD-negative, T p53 pathogenic variant, PD-L1 (22  C3) CPS 5, ER 2+ positive, FOLR1 2+, 75% (mirvetuximab) - Cycle 5 of carboplatin  and paclitaxel  on 01/17/2023   2. Social/Family History: -She lives at home with husband and is his primary caregiver. She is independent ADLS and iADLS. Retired international aid/development worker. No tobacco use.  -She had skin squamous cell carcinoma. Her brother had colon cancer at 84 and follicular non-Hodgkin's lymphoma and DLBCL, her maternal grandfather died of prostate cancer, maternal first cousin had colon cancer, and maternal aunt died of lung cancer (smoker).     Plan: 1.  Peritoneal carcinomatosis/ovarian cancer: - She has tolerated cycle 5 on 01/17/2023 reasonably well. - Abdominal wounds have completely healed. - Reviewed labs today: Normal LFTs and creatinine.  CBC was grossly normal.  CA125 was 974 on 01/17/2023. - She will proceed with cycle 6 today.  RTC 3 weeks for follow-up for cycle 7 which will be third cycle after surgery.  Will consider repeating scan after that and consider maintenance therapy. - Repeat germline mutation testing results are pending at this time.   2.  Intermittent nausea: - Continue Compazine  5 mg every 6 hours as needed.  Nausea well-controlled.   3.  Left gastrocnemius/calf vein distal DVT (Dx 07/24/2022): - Continue Eliquis  twice daily.  No bleeding issues reported.   4.  Peripheral neuropathy: - Tingling in the fingertips is constant and stable and toes is also stable.  Paclitaxel  reduced by 20%.   5. Iron deficiency anemia: -Last Feraheme on 11/05/2022.  Hemoglobin today is 11.4.    No orders of the defined types were placed in this encounter.     I,Katie Daubenspeck,acting as a neurosurgeon for Hai Rogers, MD.,have documented all relevant documentation on the behalf of Hai Rogers, MD,as directed by  Hai Rogers, MD while in the presence of Hai Rogers, MD.   I, Hai Rogers MD, have reviewed the above documentation for accuracy and  completeness, and I agree with the  above.   Alean Stands, MD   1/13/20254:06 PM  CHIEF COMPLAINT:   Diagnosis: Peritoneal carcinomatosis/ovarian cancer    Cancer Staging  No matching staging information was found for the patient.    Prior Therapy: none  Current Therapy:  Neoadjuvant carboplatin  and paclitaxel     HISTORY OF PRESENT ILLNESS:   Oncology History Overview Note  CARIS: FOLR1 2+ (75%) ER(2+) HRD-, PDL1 CPS 5%, p53 mutation No mutation in BRCA1/2, ATM, ERBB2, KRAS PR negative   Carcinomatosis (HCC)  08/31/2022 Initial Diagnosis   Carcinomatosis (HCC)   09/12/2022 -  Chemotherapy   Patient is on Treatment Plan : OVARIAN Carboplatin  (AUC 6) + Paclitaxel  (175) q21d X 6 Cycles     Gynecologic malignancy (HCC)  12/12/2022 Initial Diagnosis   Gynecologic malignancy [facility])      INTERVAL HISTORY:   Rachel Carlson is a 78 y.o. female presenting to clinic today for follow up of Peritoneal carcinomatosis/ovarian cancer. She was last seen by me on 01/17/23.  Today, she states that she is doing well overall. Her appetite level is at 65%. Her energy level is at 75%.  PAST MEDICAL HISTORY:   Past Medical History: 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)    Esophageal stricture    Family history of breast cancer    Family history of colon cancer    Family history of melanoma    Family history of prostate cancer    Fibroids 1998   uterine, history of (left ovaries)   GERD (gastroesophageal reflux disease)    History of hiatal hernia    History of uterine prolapse    Hx of adenomatous polyp of colon 10/19/2014   Hyperlipidemia    Hypertension    borderline no meds diet and exercise   Neuromuscular disorder (HCC)    neuropathy from chemo   Obesity    Ovarian cancer (HCC)    PVC (premature ventricular contraction)    first dx by  holter in 1980's, echo (6/10) EF 55-60%, normal diastolic fxn, normal size RV and fxn, mild MR, PASP   Squamous cell carcinoma of arm    ovarian   Tubal pregnancy    Ulcerative proctosigmoiditis (HCC)    Urge urinary incontinence 06/03/2007   Qualifier: Diagnosis of  By: Cecilie CMA, NANNIE Ivy      Surgical History: Past Surgical History:  Procedure Laterality Date   BILATERAL SALPINGECTOMY Bilateral 12/12/2022   Procedure: OPEN BILATERAL SALPINGECTOMY-OOPHORECTOMY;  Surgeon: Viktoria Comer SAUNDERS, MD;  Location: [facility];  Service: Gynecology;  Laterality: Bilateral;   BLADDER NECK SUSPENSION  1998   BREAST BIOPSY Left    BREAST LUMPECTOMY Left    benign   CATARACT EXTRACTION Right 01/2020   CATARACT EXTRACTION W/PHACO Left 08/12/2020   Procedure: CATARACT EXTRACTION PHACO AND INTRAOCULAR LENS PLACEMENT LEFT EYE;  Surgeon: Harrie Agent, MD;  Location: [facility];  Service: Ophthalmology;  Laterality: Left;  left CDE=10.71   COLONOSCOPY  05/18/2011/10/12/14   Dr. Lupita Commander   DEBULKING N/A 12/12/2022   Procedure: RADICAL TUMOR DEBULKING, PERITONEAL STRIPPING, MOBILIZATION SPLENIC FLEXURE, RECTOSIGMOID RESECTION,SMALL BOWEL RESECTION AND REANASTOMOSIS ,END TO END DESCENDING COLOSTOMY;  Surgeon: Viktoria Comer SAUNDERS, MD;  Location: [facility];  Service: Gynecology;  Laterality: N/A;   ECTOPIC PREGNANCY SURGERY  1980   ESOPHAGOGASTRODUODENOSCOPY (EGD) WITH ESOPHAGEAL DILATION  04/03/2012   Dr. Lupita  Gessner   INCISION AND DRAINAGE ABSCESS N/A 01/02/2023   Procedure: INCISION AND DRAINAGE OF ABDOMINAL WOUND SEROMA;  Surgeon: Rogelio Planas, MD;  Location: [facility];  Service: Gynecology;  Laterality: N/A;   IR IMAGING GUIDED PORT INSERTION  09/06/2022   OMENTECTOMY N/A 12/12/2022   Procedure: OMENTECTOMY;  Surgeon: Viktoria Comer SAUNDERS, MD;  Location: [facility];  Service: Gynecology;  Laterality: N/A;   PARTIAL HYSTERECTOMY     abdominal, fibroids, and prolapse (1998) bladder tack    SALPINGOOPHORECTOMY Bilateral 12/12/2022   Procedure: DIAGNOSTIC LAPAROSCOPY;  Surgeon: Viktoria Comer SAUNDERS, MD;  Location: [facility];  Service: Gynecology;  Laterality: Bilateral;   SKIN CANCER EXCISION     UPPER GASTROINTESTINAL ENDOSCOPY      Social History: Social History   Socioeconomic History   Marital status: Married    Spouse name: Not on file   Number of children: 3   Years of education: Not on file   Highest education level: Not on file  Occupational History   Occupation: international aid/development worker    Comment: retired 2009    Employer: unemployed  Tobacco Use   Smoking status: Never   Smokeless tobacco: Never  Vaping Use   Vaping status: Never Used  Substance and Sexual Activity   Alcohol  use: No    Alcohol /week: 0.0 standard drinks of alcohol     Comment: rarely beer   Drug use: No   Sexual activity: Not Currently  Other Topics Concern   Not on file  Social History Narrative   Retired international aid/development worker, 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)   Grown children   Never smoker no alcohol  tobacco or drug use   Social Drivers of Corporate Investment Banker Strain: Low Risk  (08/28/2022)   Overall Financial Resource Strain (CARDIA)    Difficulty of Paying Living Expenses: Not hard at all  Food Insecurity: No Food Insecurity (01/10/2023)   Hunger Vital Sign    Worried About Running Out of Food in the Last Year: Never true    Ran Out of Food in the Last Year: Never true  Transportation Needs: No Transportation Needs (01/10/2023)   PRAPARE - Administrator, Civil Service (Medical): No    Lack of Transportation (Non-Medical): No  Physical Activity: Inactive (08/28/2022)   Exercise Vital Sign    Days of Exercise per Week: 0 days    Minutes of Exercise per Session: 0 min  Stress: No Stress Concern Present (08/28/2022)   [employer] of Occupational Health - Occupational Stress Questionnaire    Feeling of Stress : Not at all  Social Connections: Moderately Integrated  (08/28/2022)   Social Connection and Isolation Panel [NHANES]    Frequency of Communication with Friends and Family: More than three times a week    Frequency of Social Gatherings with Friends and Family: More than three times a week    Attends Religious Services: Never    Database Administrator or Organizations: Yes    Attends Engineer, Structural: More than 4 times per year    Marital Status: Married  Catering Manager Violence: Not At Risk (01/10/2023)   Humiliation, Afraid, Rape, and Kick questionnaire    Fear of Current or Ex-Partner: No    Emotionally Abused: No    Physically Abused: No    Sexually Abused: No    Family History: Family History  Problem Relation Age of Onset   Sudden death Father 12       coronary arteriosclerosis  on death certificate   Heart attack Mother 80   Transient ischemic attack Mother    Diabetes type II Mother    Sudden death Mother        died age 40   Diabetes Mother    Coronary artery disease Mother    Skin cancer Mother    Barrett's esophagus Mother    Arrhythmia Brother    Colon cancer Brother 65   Breast cancer Sister        dx in her 44s   Colon polyps Sister        adenomatous   Colon cancer Son 45       lynch syndrome   Melanoma Maternal Uncle    Congestive Heart Failure Maternal Grandmother    Prostate cancer Maternal Grandfather        dx in 47s   Tuberculosis Paternal Grandfather    Skin cancer Maternal Aunt    Barrett's esophagus Maternal Aunt    Other Son        parotid gland tumor   Ehlers-Danlos syndrome Daughter    Colon cancer Cousin        mid 69s; maternal cousin   Colon cancer Other        MGMs brother   Esophageal cancer Neg Hx    Stomach cancer Neg Hx    Rectal cancer Neg Hx     Current Medications:  Current Outpatient Medications:    apixaban  (ELIQUIS ) 5 MG TABS tablet, Take 1 tablet (5 mg total) by mouth 2 (two) times daily., Disp: 180 tablet, Rfl: 0   Ascorbic Acid  (VITAMIN C ) 1000 MG  tablet, Take 1,000 mg by mouth daily., Disp: , Rfl:    calcium  citrate-vitamin D  (CITRACAL+D) 315-200 MG-UNIT per tablet, Take 1 tablet by mouth 2 (two) times daily., Disp: , Rfl:    CARBOPLATIN  IV, Inject into the vein every 21 ( twenty-one) days. port, Disp: , Rfl:    Cholecalciferol (VITAMIN D -3) 5000 UNITS TABS, Take 5,000 Units by mouth daily., Disp: , Rfl:    ciprofloxacin  (CIPRO ) 500 MG tablet, Take 1 tablet (500 mg total) by mouth 2 (two) times daily., Disp: 20 tablet, Rfl: 0   Coenzyme Q10 (CO Q-10) 200 MG CAPS, Take 200 mg by mouth daily., Disp: , Rfl:    CRANBERRY PO, Take 650 mg by mouth daily., Disp: , Rfl:    Glucosamine-Chondroitin (GLUCOSAMINE CHONDR COMPLEX PO), Take 2 capsules by mouth daily., Disp: , Rfl:    GRAPE SEED EXTRACT PO, Take 1 capsule by mouth daily., Disp: , Rfl:    ketoconazole  (NIZORAL ) 2 % cream, Apply 1 Application topically daily. To yeast rash, Disp: 30 g, Rfl: 1   lansoprazole  (PREVACID ) 15 MG capsule, Take 1 capsule (15 mg total) by mouth 2 (two) times daily before a meal., Disp: , Rfl:    lidocaine -prilocaine  (EMLA ) cream, Apply to affected area once (Patient taking differently: Apply 1 Application topically daily as needed (prior to port access).), Disp: 30 g, Rfl: 3   milk thistle 175 MG tablet, Take 175 mg by mouth daily., Disp: , Rfl:    mirtazapine  (REMERON ) 15 MG tablet, Take 1 tablet (15 mg total) by mouth at bedtime., Disp: 30 tablet, Rfl: 4   Omega-3 Fatty Acids (FISH OIL TRIPLE STRENGTH PO), Take 1 capsule by mouth daily., Disp: , Rfl:    ondansetron  (ZOFRAN ) 8 MG tablet, Take 1 tablet (8 mg total) by mouth every 8 (eight) hours as needed for nausea or vomiting.  Start on the third day after carboplatin ., Disp: 30 tablet, Rfl: 1   PACLITAXEL  IV, Inject into the vein every 21 ( twenty-one) days. port, Disp: , Rfl:    phenazopyridine  (PYRIDIUM ) 200 MG tablet, Take 1 tablet (200 mg total) by mouth 3 (three) times daily as needed for pain., Disp: 10  tablet, Rfl: 0   Polyethyl Glycol-Propyl Glycol (SYSTANE OP), Place 1 drop into both eyes 4 (four) times daily as needed (for dryness)., Disp: , Rfl:    PREVACID  24HR 15 MG capsule, Take 15 mg by mouth See admin instructions. Take 15 mg by mouth in the morning before any food and an additional 15 mg once a day as needed for unresolved reflux, Disp: , Rfl:    prochlorperazine  (COMPAZINE ) 10 MG tablet, Take 10 mg by mouth every 6 (six) hours as needed for nausea or vomiting., Disp: , Rfl:    rosuvastatin  (CRESTOR ) 20 MG tablet, Take 1 tablet (20 mg total) by mouth at bedtime., Disp: 90 tablet, Rfl: 3   simethicone  (MYLICON) 80 MG chewable tablet, Chew 1 tablet (80 mg total) by mouth 4 (four) times daily as needed for flatulence., Disp: 30 tablet, Rfl: 4   traMADol  (ULTRAM ) 50 MG tablet, Take 1 tablet (50 mg total) by mouth every 6 (six) hours as needed for severe pain (pain score 7-10). For AFTER surgery only, do not take and drive, Disp: 15 tablet, Rfl: 0   traZODone  (DESYREL ) 50 MG tablet, Take 1 tablet (50 mg total) by mouth at bedtime as needed for sleep., Disp: 30 tablet, Rfl: 1   vitamin B-12 (CYANOCOBALAMIN ) 1000 MCG tablet, Take 1,000 mcg by mouth daily., Disp: , Rfl:    zinc gluconate 50 MG tablet, Take 50 mg by mouth daily., Disp: , Rfl:   Current Facility-Administered Medications:    botulinum toxin Type A  (BOTOX ) injection 200 Units, 200 Units, Intramuscular, Once,   Facility-Administered Medications Ordered in Other Visits:    CARBOplatin  (PARAPLATIN ) 520 mg in sodium chloride  0.9 % 250 mL chemo infusion, 520 mg, Intravenous, Once, Rogers Hai, MD   heparin  lock flush 100 unit/mL, 500 Units, Intracatheter, Once PRN, Jacalyn Biggs, MD   sodium chloride  flush (NS) 0.9 % injection 10 mL, 10 mL, Intracatheter, PRN, Varina Hulon, MD   Allergies: Allergies  Allergen Reactions   Aspartame     Intolerance to artifical sweeteners, pt states caused burning of bladder    Levaquin  [Levofloxacin ] Nausea Only   Pantoprazole  Sodium Diarrhea    Abdominal pain   Ranitidine Diarrhea    abdominal pain   Omeprazole  Diarrhea    Abdominal pain   Sulfasalazine  Rash and Other (See Comments)    Fever, chills, headache, muscle pain also    REVIEW OF SYSTEMS:   Review of Systems  Constitutional:  Negative for chills, fatigue and fever.  HENT:   Negative for lump/mass, mouth sores, nosebleeds, sore throat and trouble swallowing.   Eyes:  Negative for eye problems.  Respiratory:  Negative for cough and shortness of breath.   Cardiovascular:  Negative for chest pain, leg swelling and palpitations.  Gastrointestinal:  Positive for nausea. Negative for abdominal pain, constipation, diarrhea and vomiting.  Genitourinary:  Negative for bladder incontinence, difficulty urinating, dysuria, frequency, hematuria and nocturia.   Musculoskeletal:  Negative for arthralgias, back pain, flank pain, myalgias and neck pain.  Skin:  Negative for itching and rash.  Neurological:  Positive for numbness. Negative for dizziness and headaches.  Hematological:  Does not bruise/bleed easily.  Psychiatric/Behavioral:  Negative for depression, sleep disturbance and suicidal ideas. The patient is not nervous/anxious.   All other systems reviewed and are negative.    VITALS:   Blood pressure 135/72, pulse 95, temperature 99 F (37.2 C), temperature source Tympanic, resp. rate 18, weight 159 lb 9.8 oz (72.4 kg), last menstrual period 05/29/1996, SpO2 98%.  Wt Readings from Last 3 Encounters:  02/11/23 159 lb 9.8 oz (72.4 kg)  01/17/23 160 lb 3.2 oz (72.7 kg)  01/15/23 159 lb (72.1 kg)    Body mass index is 29.19 kg/m.  Performance status (ECOG): 1 - Symptomatic but completely ambulatory  PHYSICAL EXAM:   Physical Exam Vitals and nursing note reviewed. Exam conducted with a chaperone present.  Constitutional:      Appearance: Normal appearance.  Cardiovascular:     Rate and  Rhythm: Normal rate and regular rhythm.     Pulses: Normal pulses.     Heart sounds: Normal heart sounds.  Pulmonary:     Effort: Pulmonary effort is normal.     Breath sounds: Normal breath sounds.  Abdominal:     Palpations: Abdomen is soft. There is no hepatomegaly, splenomegaly or mass.     Tenderness: There is no abdominal tenderness.  Musculoskeletal:     Right lower leg: No edema.     Left lower leg: No edema.  Lymphadenopathy:     Cervical: No cervical adenopathy.     Right cervical: No superficial, deep or posterior cervical adenopathy.    Left cervical: No superficial, deep or posterior cervical adenopathy.     Upper Body:     Right upper body: No supraclavicular or axillary adenopathy.     Left upper body: No supraclavicular or axillary adenopathy.  Neurological:     General: No focal deficit present.     Mental Status: She is alert and oriented to person, place, and time.  Psychiatric:        Mood and Affect: Mood normal.        Behavior: Behavior normal.     LABS:   CBC     Component Value Date/Time   WBC 6.5 02/11/2023 0944   RBC 3.67 (L) 02/11/2023 0944   HGB 11.4 (L) 02/11/2023 0944   HGB 9.8 (L) 12/31/2022 1551   HCT 34.5 (L) 02/11/2023 0944   PLT 200 02/11/2023 0944   PLT 361 12/31/2022 1551   MCV 94.0 02/11/2023 0944   MCH 31.1 02/11/2023 0944   MCHC 33.0 02/11/2023 0944   RDW 14.4 02/11/2023 0944   LYMPHSABS 0.9 02/11/2023 0944   MONOABS 0.6 02/11/2023 0944   EOSABS 0.0 02/11/2023 0944   BASOSABS 0.0 02/11/2023 0944    CMP      Component Value Date/Time   NA 134 (L) 02/11/2023 0944   K 3.8 02/11/2023 0944   CL 104 02/11/2023 0944   CO2 24 02/11/2023 0944   GLUCOSE 99 02/11/2023 0944   BUN 15 02/11/2023 0944   CREATININE 0.50 02/11/2023 0944   CALCIUM  9.1 02/11/2023 0944   PROT 6.5 02/11/2023 0944   ALBUMIN  3.3 (L) 02/11/2023 0944   AST 15 02/11/2023 0944   ALT 11 02/11/2023 0944   ALKPHOS 58 02/11/2023 0944   BILITOT 0.6  02/11/2023 0944   GFRNONAA >60 02/11/2023 0944   GFRAA 94 06/04/2007 1522     Lab Results  Component Value Date   CEA1 1.0 08/26/2022   /  CEA  Date Value Ref Range Status  08/26/2022 1.0 0.0 -  4.7 ng/mL Final    Comment:    (NOTE)                             Nonsmokers          <3.9                             Smokers             <5.6 [employer] Electrochemiluminescence Immunoassay (ECLIA) Values obtained with different assay methods or kits cannot be used interchangeably.  Results cannot be interpreted as absolute evidence of the presence or absence of malignant disease. Performed At: [facility] [address] [place], [place] [id number] Jennette Shorter MD Ey:[phone]    No results found for: PSA1 No results found for: CAN199 Lab Results  Component Value Date   CAN125 974.0 (H) 01/17/2023    No results found for: TOTALPROTELP, ALBUMINELP, A1GS, A2GS, BETS, BETA2SER, GAMS, MSPIKE, SPEI Lab Results  Component Value Date   TIBC 369 10/24/2022   FERRITIN 22 10/24/2022   IRONPCTSAT 10 (L) 10/24/2022   No results found for: LDH   STUDIES:   No results found.

## 2023-02-11 ENCOUNTER — Inpatient Hospital Stay: Payer: Medicare Other | Admitting: Hematology

## 2023-02-11 ENCOUNTER — Inpatient Hospital Stay: Payer: Medicare Other

## 2023-02-11 ENCOUNTER — Inpatient Hospital Stay: Payer: Medicare Other | Admitting: Dietician

## 2023-02-11 ENCOUNTER — Encounter (HOSPITAL_COMMUNITY): Payer: Self-pay | Admitting: *Deleted

## 2023-02-11 VITALS — BP 135/77 | HR 83 | Resp 18

## 2023-02-11 VITALS — BP 135/72 | HR 95 | Temp 99.0°F | Resp 18 | Wt 159.6 lb

## 2023-02-11 DIAGNOSIS — D509 Iron deficiency anemia, unspecified: Secondary | ICD-10-CM | POA: Diagnosis not present

## 2023-02-11 DIAGNOSIS — Z86718 Personal history of other venous thrombosis and embolism: Secondary | ICD-10-CM | POA: Diagnosis not present

## 2023-02-11 DIAGNOSIS — C800 Disseminated malignant neoplasm, unspecified: Secondary | ICD-10-CM | POA: Diagnosis not present

## 2023-02-11 DIAGNOSIS — Z5111 Encounter for antineoplastic chemotherapy: Secondary | ICD-10-CM | POA: Diagnosis not present

## 2023-02-11 DIAGNOSIS — Z807 Family history of other malignant neoplasms of lymphoid, hematopoietic and related tissues: Secondary | ICD-10-CM | POA: Diagnosis not present

## 2023-02-11 DIAGNOSIS — C579 Malignant neoplasm of female genital organ, unspecified: Secondary | ICD-10-CM | POA: Diagnosis not present

## 2023-02-11 DIAGNOSIS — Z90722 Acquired absence of ovaries, bilateral: Secondary | ICD-10-CM | POA: Diagnosis not present

## 2023-02-11 DIAGNOSIS — Z7901 Long term (current) use of anticoagulants: Secondary | ICD-10-CM | POA: Diagnosis not present

## 2023-02-11 DIAGNOSIS — G629 Polyneuropathy, unspecified: Secondary | ICD-10-CM | POA: Diagnosis not present

## 2023-02-11 DIAGNOSIS — Z90710 Acquired absence of both cervix and uterus: Secondary | ICD-10-CM | POA: Diagnosis not present

## 2023-02-11 DIAGNOSIS — R110 Nausea: Secondary | ICD-10-CM | POA: Diagnosis not present

## 2023-02-11 DIAGNOSIS — C482 Malignant neoplasm of peritoneum, unspecified: Secondary | ICD-10-CM | POA: Diagnosis not present

## 2023-02-11 DIAGNOSIS — Z800 Family history of malignant neoplasm of digestive organs: Secondary | ICD-10-CM | POA: Diagnosis not present

## 2023-02-11 DIAGNOSIS — Z801 Family history of malignant neoplasm of trachea, bronchus and lung: Secondary | ICD-10-CM | POA: Diagnosis not present

## 2023-02-11 DIAGNOSIS — Z85828 Personal history of other malignant neoplasm of skin: Secondary | ICD-10-CM | POA: Diagnosis not present

## 2023-02-11 LAB — COMPREHENSIVE METABOLIC PANEL
ALT: 11 U/L (ref 0–44)
AST: 15 U/L (ref 15–41)
Albumin: 3.3 g/dL — ABNORMAL LOW (ref 3.5–5.0)
Alkaline Phosphatase: 58 U/L (ref 38–126)
Anion gap: 6 (ref 5–15)
BUN: 15 mg/dL (ref 8–23)
CO2: 24 mmol/L (ref 22–32)
Calcium: 9.1 mg/dL (ref 8.9–10.3)
Chloride: 104 mmol/L (ref 98–111)
Creatinine, Ser: 0.5 mg/dL (ref 0.44–1.00)
GFR, Estimated: >60 mL/min (ref >60)
Glucose, Bld: 99 mg/dL (ref 70–99)
Potassium: 3.8 mmol/L (ref 3.5–5.1)
Sodium: 134 mmol/L — ABNORMAL LOW (ref 135–145)
Total Bilirubin: 0.6 mg/dL (ref 0.0–1.2)
Total Protein: 6.5 g/dL (ref 6.5–8.1)

## 2023-02-11 LAB — CBC WITH DIFFERENTIAL/PLATELET
Abs Immature Granulocytes: 0.02 10*3/uL (ref 0.00–0.07)
Basophils Absolute: 0 10*3/uL (ref 0.0–0.1)
Basophils Relative: 1 %
Eosinophils Absolute: 0 10*3/uL (ref 0.0–0.5)
Eosinophils Relative: 0 %
HCT: 34.5 % — ABNORMAL LOW (ref 36.0–46.0)
Hemoglobin: 11.4 g/dL — ABNORMAL LOW (ref 12.0–15.0)
Immature Granulocytes: 0 %
Lymphocytes Relative: 13 %
Lymphs Abs: 0.9 10*3/uL (ref 0.7–4.0)
MCH: 31.1 pg (ref 26.0–34.0)
MCHC: 33 g/dL (ref 30.0–36.0)
MCV: 94 fL (ref 80.0–100.0)
Monocytes Absolute: 0.6 10*3/uL (ref 0.1–1.0)
Monocytes Relative: 9 %
Neutro Abs: 5.1 10*3/uL (ref 1.7–7.7)
Neutrophils Relative %: 77 %
Platelets: 200 10*3/uL (ref 150–400)
RBC: 3.67 MIL/uL — ABNORMAL LOW (ref 3.87–5.11)
RDW: 14.4 % (ref 11.5–15.5)
WBC: 6.5 10*3/uL (ref 4.0–10.5)
nRBC: 0 % (ref 0.0–0.2)

## 2023-02-11 LAB — MAGNESIUM: Magnesium: 2 mg/dL (ref 1.7–2.4)

## 2023-02-11 MED ORDER — SODIUM CHLORIDE 0.9% FLUSH
10.0000 mL | INTRAVENOUS | Status: DC | PRN
  Administered 2023-02-11: 10 mL

## 2023-02-11 MED ORDER — SODIUM CHLORIDE 0.9 % IV SOLN: Freq: Once | INTRAVENOUS | Status: AC

## 2023-02-11 MED ORDER — PALONOSETRON HCL INJECTION 0.25 MG/5ML
0.2500 mg | Freq: Once | INTRAVENOUS | Status: AC
  Administered 2023-02-11: 0.25 mg via INTRAVENOUS
  Filled 2023-02-11: qty 5

## 2023-02-11 MED ORDER — FAMOTIDINE IN NACL 20-0.9 MG/50ML-% IV SOLN
20.0000 mg | Freq: Once | INTRAVENOUS | Status: AC
Start: 2023-02-11 — End: 2023-02-11
  Administered 2023-02-11: 20 mg via INTRAVENOUS
  Filled 2023-02-11: qty 50

## 2023-02-11 MED ORDER — SODIUM CHLORIDE 0.9 % IV SOLN
520.0000 mg | Freq: Once | INTRAVENOUS | Status: AC
  Administered 2023-02-11: 520 mg via INTRAVENOUS
  Filled 2023-02-11: qty 52

## 2023-02-11 MED ORDER — DEXAMETHASONE SODIUM PHOSPHATE 10 MG/ML IJ SOLN
10.0000 mg | Freq: Once | INTRAMUSCULAR | Status: AC
  Administered 2023-02-11: 10 mg via INTRAVENOUS
  Filled 2023-02-11: qty 1

## 2023-02-11 MED ORDER — HEPARIN SOD (PORK) LOCK FLUSH 100 UNIT/ML IV SOLN
500.0000 [IU] | Freq: Once | INTRAVENOUS | Status: AC | PRN
  Administered 2023-02-11: 500 [IU]

## 2023-02-11 MED ORDER — PACLITAXEL CHEMO INJECTION 300 MG/50ML
140.0000 mg/m2 | Freq: Once | INTRAVENOUS | Status: AC
  Administered 2023-02-11: 270 mg via INTRAVENOUS
  Filled 2023-02-11: qty 45

## 2023-02-11 MED ORDER — CETIRIZINE HCL 10 MG/ML IV SOLN
10.0000 mg | Freq: Once | INTRAVENOUS | Status: AC
Start: 2023-02-11 — End: 2023-02-11
  Administered 2023-02-11: 10 mg via INTRAVENOUS
  Filled 2023-02-11: qty 1

## 2023-02-11 MED ORDER — FOSAPREPITANT DIMEGLUMINE INJECTION 150 MG
150.0000 mg | Freq: Once | INTRAVENOUS | Status: AC
  Administered 2023-02-11: 150 mg via INTRAVENOUS
  Filled 2023-02-11: qty 150

## 2023-02-11 NOTE — Progress Notes (Signed)
 Nutrition Follow-up:  Pt with peritoneal carcinomatosis. S/p neoadjuvant chemotherapy followed by debulking surgery 11/13 under the care of Dr. Viktoria. Patient is currently receiving carbo/taxol  q21d.   Met with patient in infusion. She reports overall doing okay. She has been able to maintain weight since surgery. States she has been eating a little bit of junk food to help with weight. Patient eating breakfast and dinner meals. She likes a lot of vegetables and reports needing to increase protein foods. Patient is drinking once Ensure Complete. Patient with ileostomy s/p surgery. She is still adjusting and feeling frustrated. Patient would like speak with someone who has one and understands what she is going through.    Medications: reviewed   Labs: Na 134, albumin  3.3  Anthropometrics: Wt 159 lb 9.8 oz today - stable   12/19 - 160 lb 3.2 oz 11/25 - 159 lb 3.2 oz   NUTRITION DIAGNOSIS: Unintended wt loss - stable s/p surgery   INTERVENTION:  Encouraged high protein snacks in between meals Continue drinking Ensure Complete/equivalent - samples + coupons Secure chat sent to LCSW to assist with possible mentorship/support group for ostomy pts Support and encouragement     MONITORING, EVALUATION, GOAL: wt trends, intake   NEXT VISIT: Monday February 3 during infusion

## 2023-02-11 NOTE — Patient Instructions (Signed)

## 2023-02-11 NOTE — Progress Notes (Signed)
 Patient has been examined by Dr. Ellin Saba. Vital signs and labs have been reviewed by MD - ANC, Creatinine, LFTs, hemoglobin, and platelets are within treatment parameters per M.D. - pt may proceed with treatment.  Primary RN and pharmacy notified.

## 2023-02-11 NOTE — Progress Notes (Signed)

## 2023-02-11 NOTE — Patient Instructions (Signed)
 [facility] - A DEPT OF [facility]  Discharge Instructions: Thank you for choosing [facility] to provide your oncology and hematology care.  If you have a lab appointment with the Cancer Center - please note that after April 8th, 2024, all labs will be drawn in the cancer center.  You do not have to check in or register with the main entrance as you have in the past but will complete your check-in in the cancer center.  Wear comfortable clothing and clothing appropriate for easy access to any Portacath or PICC line.   We strive to give you quality time with your provider. You may need to reschedule your appointment if you arrive late (15 or more minutes).  Arriving late affects you and other patients whose appointments are after yours.  Also, if you miss three or more appointments without notifying the office, you may be dismissed from the clinic at the provider's discretion.      For prescription refill requests, have your pharmacy contact our office and allow 72 hours for refills to be completed.    Today you received the following chemotherapy and/or immunotherapy agents taxol  and  carbo0platin.       To help prevent nausea and vomiting after your treatment, we encourage you to take your nausea medication as directed.  BELOW ARE SYMPTOMS THAT SHOULD BE REPORTED IMMEDIATELY: *FEVER GREATER THAN 100.4 F (38 C) OR HIGHER *CHILLS OR SWEATING *NAUSEA AND VOMITING THAT IS NOT CONTROLLED WITH YOUR NAUSEA MEDICATION *UNUSUAL SHORTNESS OF BREATH *UNUSUAL BRUISING OR BLEEDING *URINARY PROBLEMS (pain or burning when urinating, or frequent urination) *BOWEL PROBLEMS (unusual diarrhea, constipation, pain near the anus) TENDERNESS IN MOUTH AND THROAT WITH OR WITHOUT PRESENCE OF ULCERS (sore throat, sores in mouth, or a toothache) UNUSUAL RASH, SWELLING OR PAIN  UNUSUAL VAGINAL DISCHARGE OR ITCHING   Items with * indicate a potential emergency and  should be followed up as soon as possible or go to the Emergency Department if any problems should occur.  Please show the CHEMOTHERAPY ALERT CARD or IMMUNOTHERAPY ALERT CARD at check-in to the Emergency Department and triage nurse.  Should you have questions after your visit or need to cancel or reschedule your appointment, please contact [facility]  - A DEPT OF [facility] [phone]  and follow the prompts.  Office hours are 8:00 a.m. to 4:30 p.m. Monday - Friday. Please note that voicemails left after 4:00 p.m. may not be returned until the following business day.  We are closed weekends and major holidays. You have access to a nurse at all times for urgent questions. Please call the main number to the clinic [phone] and follow the prompts.  For any non-urgent questions, you may also contact your provider using MyChart. We now offer e-Visits for anyone 36 and older to request care online for non-urgent symptoms. For details visit [email or web address].   Also download the MyChart app! Go to the app store, search MyChart, open the app, select [place], and log in with your MyChart username and password.

## 2023-02-11 NOTE — Progress Notes (Signed)
 Patient presents today for Paclitaxel /Carboplatin  infusion. Patient is in satisfactory condition with no new complaints voiced.  Vital signs are stable.  Labs reviewed by Dr. Rogers during the office visit and all labs are within treatment parameters.  We will proceed with treatment per MD orders.

## 2023-02-12 ENCOUNTER — Ambulatory Visit (INDEPENDENT_AMBULATORY_CARE_PROVIDER_SITE_OTHER): Payer: Medicare Other | Admitting: Obstetrics

## 2023-02-12 ENCOUNTER — Encounter: Payer: Self-pay | Admitting: Obstetrics

## 2023-02-12 VITALS — BP 128/85 | HR 96

## 2023-02-12 DIAGNOSIS — N3941 Urge incontinence: Secondary | ICD-10-CM

## 2023-02-12 DIAGNOSIS — I824Z2 Acute embolism and thrombosis of unspecified deep veins of left distal lower extremity: Secondary | ICD-10-CM

## 2023-02-12 LAB — CA 125: Cancer Antigen (CA) 125: 1404 U/mL — ABNORMAL HIGH (ref 0.0–38.1)

## 2023-02-12 NOTE — Assessment & Plan Note (Signed)
-   on eliquis due to history of DVT and current cancer - discussed holding Eliquis for at least 72hrs prior to procedure and resume after

## 2023-02-12 NOTE — Progress Notes (Signed)
 [facility] Return Visit  SUBJECTIVE  History of Present Illness: Rachel Carlson is a 78 y.o. female seen in follow-up for botox  injection.   Underwent chemotherapy yesterday, s/p 6 sessions.  Bladder botox  injection 150U on 12/11/22 with relief of symptoms.  Prior botox  injection 05/28/22 Continues to report relief of urgency from botox  injections.  Uses depends for comfort, denies leakage.  Reports leakage starts to return around 6 months and desires to schedule for another repeat in 6 months.  S/p Dx lap, open BSO, omentectomy, radical tumor debulking, peritoneal stripping, mobilization of splenic flexure, rectosigmoid resection, small bowel resection and reanatomsis, end of end descending colostomy by Dr. Viktoria on 12/12/22  Past Medical History: Patient  has a past medical history of Blood transfusion without reported diagnosis, Chronic right ear pain, DVT of leg (deep venous thrombosis) (HCC), Esophageal stricture, Family history of breast cancer, Family history of colon cancer, Family history of melanoma, Family history of prostate cancer, Fibroids (1998), GERD (gastroesophageal reflux disease), History of hiatal hernia, History of uterine prolapse, adenomatous polyp of colon (10/19/2014), Hyperlipidemia, Hypertension, Neuromuscular disorder (HCC), Obesity, Ovarian cancer (HCC), PVC (premature ventricular contraction), Squamous cell carcinoma of arm, Tubal pregnancy, Ulcerative proctosigmoiditis (HCC), and Urge urinary incontinence (06/03/2007).   Past Surgical History: She  has a past surgical history that includes Ectopic pregnancy surgery (1980); Partial hysterectomy; Bladder neck suspension (1998); Esophagogastroduodenoscopy (egd) with esophageal dilation (04/03/2012); Upper gastrointestinal endoscopy; Breast biopsy (Left); Colonoscopy (05/18/2011/10/12/14); Cataract extraction (Right, 01/2020); Cataract extraction w/PHACO (Left, 08/12/2020); Skin cancer excision; Breast  lumpectomy (Left); IR IMAGING GUIDED PORT INSERTION (09/06/2022); Salpingoophorectomy (Bilateral, 12/12/2022); Omentectomy (N/A, 12/12/2022); Debulking (N/A, 12/12/2022); Bilateral salpingectomy (Bilateral, 12/12/2022); and Incision and drainage abscess (N/A, 01/02/2023).   Medications: She has a current medication list which includes the following prescription(s): apixaban , vitamin c , calcium  citrate-vitamin d , carboplatin , vitamin d -3, ciprofloxacin , co q-10, cranberry, glucosamine-chondroitin, bioflavonoid products, ketoconazole , lansoprazole , lidocaine -prilocaine , milk thistle, mirtazapine , omega-3 fatty acids, ondansetron , paclitaxel , phenazopyridine , polyethyl glycol-propyl glycol, prevacid  24hr, prochlorperazine , rosuvastatin , simethicone , tramadol , trazodone , cyanocobalamin , and zinc gluconate.   Allergies: Patient is allergic to aspartame, levaquin  [levofloxacin ], pantoprazole  sodium, ranitidine, omeprazole , and sulfasalazine .   Social History: Patient  reports that she has never smoked. She has never used smokeless tobacco. She reports that she does not drink alcohol  and does not use drugs.     OBJECTIVE     Physical Exam: Vitals:   02/12/23 1449  BP: 128/85  Pulse: 96   Gen: No apparent distress, A&O x 3.  Detailed Urogynecologic Evaluation:  Deferred.       No data to display             ASSESSMENT AND PLAN    Rachel Carlson is a 78 y.o. with:  1. Urge incontinence   2. Acute deep vein thrombosis (DVT) of distal vein of left lower extremity (HCC)     Urge incontinence Assessment & Plan: - Bladder botox  injection 150U on 12/11/22  - reports resolution of leakage and urgency - denies UTI symptoms or sensation of incomplete emptying - encouraged to discontinue diaper use if patient feels comfortable - pt desires to schedule repeat botox  injection 150U in 6 months. - encouraged pt to monitor for change in urinary symptoms or UTI symptoms that will require  evaluation.   Acute deep vein thrombosis (DVT) of distal vein of left lower extremity (HCC) Assessment & Plan: - on eliquis  due to history of DVT and current cancer - discussed holding Eliquis  for at least 72hrs prior  to procedure and resume after   Time spent: I spent 36 minutes dedicated to the care of this patient on the date of this encounter to include pre-visit review of records, face-to-face time with the patient discussing urgency urinary incontinence, and post visit documentation.    Rachel ONEIDA Gillis, MD

## 2023-02-12 NOTE — Assessment & Plan Note (Addendum)
-   Bladder botox  injection 150U on 12/11/22  - reports resolution of leakage and urgency - denies UTI symptoms or sensation of incomplete emptying - encouraged to discontinue diaper use if patient feels comfortable - pt desires to schedule repeat botox  injection 150U in 6 months. - encouraged pt to monitor for change in urinary symptoms or UTI symptoms that will require evaluation.

## 2023-02-12 NOTE — Patient Instructions (Signed)
 Please hold your Eliquis for 72 hours prior to your next botox injection.   Call and seek evaluation if you experience any change in urinary symptoms prior to your procedure.

## 2023-02-13 ENCOUNTER — Ambulatory Visit: Payer: Medicare Other

## 2023-02-13 ENCOUNTER — Inpatient Hospital Stay: Payer: Medicare Other | Admitting: Licensed Clinical Social Worker

## 2023-02-13 DIAGNOSIS — C800 Disseminated malignant neoplasm, unspecified: Secondary | ICD-10-CM

## 2023-02-13 NOTE — Progress Notes (Signed)
 [facility] CSW Progress Note  Clinical Child psychotherapist  contacted pt to check in.  Pt will have her 7th cycle of treatment at the beginning of February and then will have scans to determine next steps.  Pt states it would be helpful to have someone to talk to regarding what she is experiencing w/ treatment as many things have been unexpected.  CSW spoke to pt about the [facility] and offered to send a referral to which pt verbalized agreement.  Referral sent on behalf of pt.  CSW to check in on pt following the results of her next scan.        Quenton Bruns, LCSW Clinical Social Worker [facility]

## 2023-02-14 ENCOUNTER — Telehealth: Payer: Self-pay | Admitting: *Deleted

## 2023-02-14 ENCOUNTER — Other Ambulatory Visit: Payer: Self-pay | Admitting: Gynecologic Oncology

## 2023-02-14 DIAGNOSIS — Z933 Colostomy status: Secondary | ICD-10-CM | POA: Diagnosis not present

## 2023-02-14 DIAGNOSIS — R630 Anorexia: Secondary | ICD-10-CM

## 2023-02-14 DIAGNOSIS — F4321 Adjustment disorder with depressed mood: Secondary | ICD-10-CM

## 2023-02-14 MED ORDER — MIRTAZAPINE 15 MG PO TABS: 15.0000 mg | ORAL_TABLET | Freq: Every day | ORAL | 4 refills | Status: DC

## 2023-02-14 NOTE — Telephone Encounter (Signed)
Spoke with Rachel Carlson who states she has been off of her mirtazapine for three days and needs a refill.  Pt also states that she hasn't had any output from her ostomy since Monday night.  Pt states she has been  having a normal appetite and eating solid foods but when she has to go out like on Monday all day she had chemo appt. And she hasn't felt quite comfortable with emptying her ostomy bag in public because at home she uses a triangle cup and doesn't empty it directly into the toilet. Pt doesn't eat solid food when she has to go out for appts.  Pt didn't eat anything solid the morning on Monday, she drank an ensure for breakfast and lunch while receiving chemo so she wouldn't have to empty her colostomy bag. She had a regular solid meal Monday evening and then again on Tuesday drank only ensure because she left the house again for an MD's appt. Then again Tuesday evening had solid food again and no stool or liquid in ostomy pouch. Pt then took a dose of miralax Tuesday evening and Wednesday only had a "little turd" Pt took a dose of miralax once yesterday and then once today. No stool as of yet today.   Pt denies fever, chills, pain and states she is passing a lot of gas. States her abdomin in not distended, and doesn't feel hard.   Pt states she has been to the outpatient ostomy clinic twice so far and was there last week and the nurse said her ostomy and stoma look good. Her stoma is about 1" above the skin and has stayed the same size. Pt has called the ostomy clinic and hasn't heard back from them as of yet.   Pt advised to take a dose of miralax twice a day and reinforced good hydration, also advised her to try emptying her colostomy pouch into the toilet at home so to get used to using a public restroom. Also advised patient that message would be relayed to providers and the office would call back with any new recommendations.

## 2023-02-14 NOTE — Telephone Encounter (Signed)
Spoke with Rachel Carlson and relayed message from Dr. Pricilla Holm that she sent 4 refills of mirtazapine to pharmacy. After realizing Rx was not at [facility] in [place], but at [facility], will have Rx transferred to [facility] in [place].   Also patient needs to be more aggressive with bowel regime. Reiterated to patient to take Miralax twice daily  and add stool softener as well. Pt verbalized understanding and thanked the office for calling. Advised patient that the office will call her tomorrow to check in.

## 2023-02-14 NOTE — Telephone Encounter (Signed)
Patient called and stated "I was in the hospital and was given a script for mirtazapine to take for 30 days. I am now out. Does Dr Pricilla Holm want me to keep taking this? If so I need a refill sent to [facility] in [place]."   Patient then stated "I also need to let Dr Pricilla Holm know I have had no stool output from my colostomy since Monday night."   Message forward to both the desk RN and Dr Pricilla Holm

## 2023-02-15 ENCOUNTER — Telehealth: Payer: Self-pay | Admitting: *Deleted

## 2023-02-15 NOTE — Telephone Encounter (Signed)
-----   Message from Nurse Louis Matte sent at 02/14/2023  4:25 PM EST ----- Regarding: call pt ask about output in ostomy

## 2023-02-15 NOTE — Telephone Encounter (Signed)
Spoke with Rachel Carlson who called the office stating she had good stool results from ostomy early this morning. Pt states it's starting to be more liquid like today. Pt is going to stay on the miralax at least once a day and asked about stool softeners?   Advised patient that she can adjust her bowel regime as needed and make sure she stays well hydrated. Encouraged patient to try and not skip meals especially when she has appointments and try to practice emptying ostomy pouch in toilet at home so to get used to this when having to use a public restroom. Pt verbalized understanding and  states she has an outpatient ostomy clinic follow up appt. With Mike Gip, NP next month.   Advised patient to call with any concerns or needs.

## 2023-02-18 ENCOUNTER — Encounter: Payer: Self-pay | Admitting: Hematology

## 2023-02-18 DIAGNOSIS — H04123 Dry eye syndrome of bilateral lacrimal glands: Secondary | ICD-10-CM | POA: Diagnosis not present

## 2023-02-25 ENCOUNTER — Telehealth: Payer: Self-pay | Admitting: Genetic Counselor

## 2023-02-25 ENCOUNTER — Ambulatory Visit: Payer: Self-pay | Admitting: Genetic Counselor

## 2023-02-25 DIAGNOSIS — Z1379 Encounter for other screening for genetic and chromosomal anomalies: Secondary | ICD-10-CM

## 2023-02-25 NOTE — Telephone Encounter (Signed)
Revealed negative genetic testing.  Discussed that we do not know why she has a GYN cancer or why there is cancer in the family. It could be due to a different gene that we are not testing, or maybe our current technology may not be able to pick something up.  It will be important for her to keep in contact with genetics to keep up with whether additional testing may be needed.

## 2023-02-25 NOTE — Progress Notes (Signed)
HPI:  [name] was previously seen in the [facility] due to a personal and family history of cancer and concerns regarding a hereditary predisposition to cancer. Please refer to our prior cancer genetics clinic note for more information regarding our discussion, assessment and recommendations, at the time. [name] recent genetic test results were disclosed to her, as were recommendations warranted by these results. These results and recommendations are discussed in more detail below.  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  The report date is February 19, 2023.  The Multi-Cancer + RNA Panel offered by [facility] includes sequencing and/or deletion/duplication analysis of the following 70 genes:  AIP*, ALK, APC*, ATM*, AXIN2*, BAP1*, BARD1*, BLM*, BMPR1A*, BRCA1*, BRCA2*, BRIP1*, CDC73*, CDH1*, CDK4, CDKN1B*, CDKN2A, CHEK2*, CTNNA1*, DICER1*, EPCAM (del/dup only), EGFR, FH*, FLCN*, GREM1 (promoter dup only), HOXB13, KIT, LZTR1, MAX*, MBD4, MEN1*, MET, MITF, MLH1*, MSH2*, MSH3*, MSH6*, MUTYH*, NF1*, NF2*, NTHL1*, PALB2*, PDGFRA, PMS2*, POLD1*, POLE*, POT1*, PRKAR1A*, PTCH1*, PTEN*, RAD51C*, RAD51D*, RB1*, RET, SDHA* (sequencing only), SDHAF2*, SDHB*, SDHC*, SDHD*, SMAD4*, SMARCA4*, SMARCB1*, SMARCE1*, STK11*, SUFU*, TMEM127*, TP53*, TSC1*, TSC2*, VHL*. RNA analysis is performed for * genes.   Negative genetic testing on the common hereditary cancer panel.  The  Hereditary Gene Panel offered by [facility] includes sequencing and/or deletion duplication testing of the following 47 genes: APC, ATM, AXIN2, BARD1, BMPR1A, BRCA1, BRCA2, BRIP1, CDH1, CDK4, CDKN2A (p14ARF), CDKN2A (p16INK4a), CHEK2, CTNNA1, DICER1, EPCAM (Deletion/duplication testing only), GREM1 (promoter region deletion/duplication testing only), KIT, MEN1, MLH1, MSH2, MSH3, MSH6, MUTYH, NBN, NF1, NHTL1, PALB2, PDGFRA, PMS2, POLD1, POLE, PTEN, RAD50, RAD51C, RAD51D, SDHB, SDHC, SDHD, SMAD4, SMARCA4. STK11, TP53, TSC1, TSC2, and VHL.  The following genes were evaluated for sequence changes only: SDHA and HOXB13 c.251G>A variant only. The report date is September 25, 2017.     FAMILY HISTORY:  We obtained a detailed, 4-generation family history.  Significant diagnoses are listed below: Family History  Problem Relation Age of Onset   Heart attack Mother 74   Transient ischemic attack Mother    Diabetes type II Mother    Sudden death Mother        died age 45   Diabetes Mother    Coronary artery disease Mother    Skin cancer Mother    Barrett's esophagus Mother    Sudden death Father 67       "coronary arteriosclerosis" on death certificate   Breast cancer Sister        dx in her 66s   Colon polyps Sister        adenomatous   Arrhythmia Brother    Colon cancer Brother 49   Congestive Heart Failure Maternal Grandmother    Prostate cancer Maternal Grandfather        dx in 68s   Tuberculosis Paternal Grandfather    Ehlers-Danlos syndrome Daughter    Colon cancer Son 39       lynch syndrome   Other Son  parotid gland tumor   Skin cancer Maternal Aunt    Barrett's esophagus Maternal Aunt    Melanoma Maternal Uncle    Colon cancer Cousin        mid 43s; maternal cousin   Colon cancer Other        MGMs brother   Esophageal cancer Neg Hx    Stomach cancer Neg Hx    Rectal cancer Neg Hx    Uterine cancer Neg Hx    Bladder Cancer Neg Hx        The patient has two sons and a  daughter who are cancer free.  One son had colon cancer at 58 and reportedly has a diagnosis of Lynch syndrome.  A second son had a parotid gland tumor and has had negative genetic testing.  Her daughter has a diagnosis of EDS.  She has one sister and two brothers.  Her sister had breast cancer and her sisters daughter had an unknown cancer.  One brother had colon cancer in his 53's and two different types of lymphoma.  Her parents are both deceased.   The patient's mother had skin cancer.  She had one brother and two sisters.  The brother had melanoma and one sister had lung cancer.  Another sister had a daughter with colon cancer in her 17's.  The maternal grandparents are deceased.  The grandfather had prostate cancer.   The patient's father died of a heart attack at 36.  He had two brothers who were cancer free.  The paternal grandparents died of heart disease, but her father's maternal uncle had colon cancer.   [name] is unaware of previous family history of genetic testing for hereditary cancer risks other than what is mentioned above. There is no reported Ashkenazi Jewish ancestry. There is no known consanguinity  GENETIC TEST RESULTS: Genetic testing reported out on February 20, 2023 through the Multicancer + RNA cancer panel found no pathogenic mutations. The Multi-Cancer + RNA Panel offered by [facility] includes sequencing and/or deletion/duplication analysis of the following 70 genes:  AIP*, ALK, APC*, ATM*, AXIN2*, BAP1*, BARD1*, BLM*, BMPR1A*, BRCA1*, BRCA2*, BRIP1*, CDC73*, CDH1*, CDK4, CDKN1B*, CDKN2A, CHEK2*, CTNNA1*, DICER1*, EPCAM (del/dup only), EGFR, FH*, FLCN*, GREM1 (promoter dup only), HOXB13, KIT, LZTR1, MAX*, MBD4, MEN1*, MET, MITF, MLH1*, MSH2*, MSH3*, MSH6*, MUTYH*, NF1*, NF2*, NTHL1*, PALB2*, PDGFRA, PMS2*, POLD1*, POLE*, POT1*, PRKAR1A*, PTCH1*, PTEN*, RAD51C*, RAD51D*, RB1*, RET, SDHA* (sequencing only), SDHAF2*, SDHB*, SDHC*, SDHD*, SMAD4*, SMARCA4*, SMARCB1*, SMARCE1*,  STK11*, SUFU*, TMEM127*, TP53*, TSC1*, TSC2*, VHL*. RNA analysis is performed for * genes. The test report has been scanned into EPIC and is located under the Molecular Pathology section of the Results Review tab.  A portion of the result report is included below for reference.     We discussed with [name] that because current genetic testing is not perfect, it is possible there may be a gene mutation in one of these genes that current testing cannot detect, but that chance is small.  We also discussed, that there could be another gene that has not yet been discovered, or that we have not yet tested, that is responsible for the cancer diagnoses in the family. It is also possible there is a hereditary cause for the cancer in the family that [name] did not inherit and therefore was not identified in her testing.  Therefore, it is important to remain in touch with cancer genetics in the future so that we can continue to offer [name] the  most up to date genetic testing.   ADDITIONAL GENETIC TESTING: We discussed with [name] that her genetic testing was fairly extensive.  If there are genes identified to increase cancer risk that can be analyzed in the future, we would be happy to discuss and coordinate this testing at that time.    CANCER SCREENING RECOMMENDATIONS: [name] test result is considered negative (normal).  This means that we have not identified a hereditary cause for her personal and family history of cancer at this time. Most cancers happen by chance and this negative test suggests that her personal and family history of cancer may fall into this category.    Possible reasons for [name] negative genetic test include:  1. There may be a gene mutation in one of these genes that current testing methods cannot detect but that chance is small.  2. There could be another gene that has not yet been discovered, or that we have not yet tested, that is  responsible for the cancer diagnoses in the family.  3.  There may be no hereditary risk for cancer in the family. The cancers in [name] and/or her family may be sporadic/familial or due to other genetic and environmental factors. 4. It is also possible there is a hereditary cause for the cancer in the family that [name] did not inherit.  Therefore, it is recommended she continue to follow the cancer management and screening guidelines provided by her oncology and primary healthcare provider. An individual's cancer risk and medical management are not determined by genetic test results alone. Overall cancer risk assessment incorporates additional factors, including personal medical history, family history, and any available genetic information that may result in a personalized plan for cancer prevention and surveillance  RECOMMENDATIONS FOR FAMILY MEMBERS:   Since she did not inherit a identifiable mutation in a cancer predisposition gene included on this panel, her children could not have inherited a known mutation from her in one of these genes. Individuals in this family might be at some increased risk of developing cancer, over the general population risk, simply due to the family history of cancer.  We recommended women in this family have a yearly mammogram beginning at age 33, or 35 years younger than the earliest onset of cancer, an annual clinical breast exam, and perform monthly breast self-exams. Women in this family should also have a gynecological exam as recommended by their primary provider. All family members should be referred for colonoscopy starting at age 67, or 56 years younger than the earliest onset of cancer. It is also possible there is a hereditary cause for the cancer in [name] family that she did not inherit and therefore was not identified in her.  Based on [name]'s family history, we recommended her sister, who was diagnosed with breast cancer, or  her brother who was diagnosed with colon cancer, have genetic counseling and testing. [name] will let us know if we can be of any assistance in coordinating genetic counseling and/or testing for this family member.   FOLLOW-UP: Lastly, we discussed with [name] that cancer genetics is a rapidly advancing field and it is possible that new genetic tests will be appropriate for her and/or her family members in the future. We encouraged her to remain in contact with cancer genetics on an annual basis so we can update her personal and family histories and let her know of advances in cancer genetics that may benefit this family.  Our contact number was provided. [name] questions were answered to her satisfaction, and she knows she is welcome to call us at anytime with additional questions or concerns.   Maylon Cos, MS, [facility] Licensed, Certified Genetic Counselor [name]

## 2023-03-03 NOTE — Progress Notes (Signed)
[facility] [address] [place] [address]    Clinic Day:  03/04/2023  Referring physician: Tower, Audrie Gallus, MD  Patient Care Team: Tower, Audrie Gallus, MD as PCP - General Doreatha Massed, MD as Medical Oncologist (Medical Oncology) Therese Sarah, RN as Oncology Nurse Navigator (Medical Oncology)   ASSESSMENT & PLAN:   Assessment: 1.  Peritoneal carcinomatosis/ovarian cancer: - Presentation: 56-month history of abdominal distention and bloating. - Last colonoscopy (01/01/2020): No evidence of malignancy. - CTAP (08/23/2022): Moderate ascites, probable omental caking in the left lower quadrant, possible small peritoneal implants seen in the pelvis.  Diverticulosis of descending and sigmoid colon with moderate wall thickening of the distal sigmoid colon.  Small bilateral pleural effusions. - 08/26/2022: CA125: 7356, CEA: 1 - Germline mutation testing (09/16/2017): Negative. - 4 cycles of carboplatin and paclitaxel from 09/12/2022 through 11/14/2022 - 12/12/2022: Tumor debulking surgery including total omentectomy, resection of peritoneal disease, small bowel resection with reanastomosis, en bloc resection of the left fallopian tube and ovary as well as rectosigmoid colon, left ureterolysis, right salpingo-oophorectomy, end descending colostomy by Dr. Pricilla Holm - Pathology: Omental resection-high-grade serous carcinoma, cecal implant-high-grade serous carcinoma, ileal resection-mesenteric and serosal involvement by high-grade serous carcinoma, left lower abdominal wall nodule-high-grade serous carcinoma, right tube and ovary resection-high-grade serous carcinoma involving serosa, bladder peritoneum and rectosigmoid colon-high-grade serous carcinoma involving colonic wall and extensive involvement of the serosa and mesenteric adipose tissue.  Metastatic carcinoma involving 8/13 nodes.  pT3 cpN1a. - NGS: MSI-stable, TMB-low, HRD-negative, T p53 pathogenic variant, PD-L1 (22 C3)  CPS 5, ER 2+ positive, FOLR1 2+, 75% (mirvetuximab) - Cycle 5 of carboplatin and paclitaxel on 01/17/2023 - Germline mutation testing: Negative   2. Social/Family History: -She lives at home with husband and is his primary caregiver. She is independent ADLS and iADLS. Retired International aid/development worker. No tobacco use.  -She had skin squamous cell carcinoma. Her brother had colon cancer at 24 and follicular non-Hodgkin's lymphoma and DLBCL, her maternal grandfather died of prostate cancer, maternal first cousin had colon cancer, and maternal aunt died of lung cancer (smoker).     Plan: 1.  Peritoneal carcinomatosis/ovarian cancer: - She has tolerated cycle 6 reasonably well. - Reviewed labs today: White count 3.9 with ANC normal.  Platelet count is normal.  LFTs and creatinine are normal.  CA125 is 1404, up from 974. - She complained shortness of breath.  On examination, decreased breath sounds in the left base from effusion. - Recommend proceeding with C7 (third cycle postsurgery) without any dose modifications.  We talked about maintenance with niraparib which showed modest benefit even in women without HRD and BRCA mutation.  Also talked about bevacizumab maintenance which is known to benefit in people with pleural effusions.  Combination therapy was also discussed. - RTC 3 weeks with CT CAP with contrast and tumor marker.   2.  Intermittent nausea: - Continue to use Compazine every 6 hours as needed.  Nausea is well-controlled.   3.  Left gastrocnemius/calf vein distal DVT (Dx 07/24/2022): - Continue Eliquis twice daily.  No bleeding issues.   4.  Peripheral neuropathy: - Tingling in the fingertips is constant and stable and toes is also stable.  Paclitaxel is dose reduced.   5.  Insomnia: -She has tried trazodone 50 mg without success. - Will start her on temazepam 30 mg at bedtime.  She may cut back to 15 mg if needed.    Orders Placed This Encounter  Procedures   CT  CHEST ABDOMEN PELVIS W  CONTRAST    Standing Status:   Future    Expected Date:   03/18/2023    Expiration Date:   03/03/2024    If indicated for the ordered procedure, I authorize the administration of contrast media per Radiology protocol:   Yes    Does the patient have a contrast media/X-ray dye allergy?:   No    Preferred imaging location?:   [facility]    If indicated for the ordered procedure, I authorize the administration of oral contrast media per Radiology protocol:   Yes      I,Katie Daubenspeck,acting as a scribe for Doreatha Massed, MD.,have documented all relevant documentation on the behalf of Doreatha Massed, MD,as directed by  Doreatha Massed, MD while in the presence of Doreatha Massed, MD.   I, Doreatha Massed MD, have reviewed the above documentation for accuracy and completeness, and I agree with the above.   Doreatha Massed, MD   2/3/20259:11 AM  CHIEF COMPLAINT:   Diagnosis: Peritoneal carcinomatosis/ovarian cancer    Cancer Staging  No matching staging information was found for the patient.    Prior Therapy: 1. Neoadjuvant carboplatin and paclitaxel, 4 cycles, 09/12/22 - 11/14/22 2. Tumor debulking surgery on 12/12/22  Current Therapy:  adjuvant carboplatin and paclitaxel    HISTORY OF PRESENT ILLNESS:   Oncology History Overview Note  CARIS: FOLR1 2+ (75%) ER(2+) HRD-, PDL1 CPS 5%, p53 mutation No mutation in BRCA1/2, ATM, ERBB2, KRAS PR negative   Carcinomatosis (HCC)  08/31/2022 Initial Diagnosis   Carcinomatosis (HCC)   09/12/2022 -  Chemotherapy   Patient is on Treatment Plan : OVARIAN Carboplatin (AUC 6) + Paclitaxel (175) q21d X 6 Cycles     Gynecologic malignancy (HCC)  12/12/2022 Initial Diagnosis   Gynecologic malignancy (HCC)   02/19/2023 Genetic Testing   Negative genetic testing on the Multi-cancer + RNA panel.  The report date is February 19, 2023.  The Multi-Cancer + RNA Panel offered by [facility] includes sequencing  and/or deletion/duplication analysis of the following 70 genes:  AIP*, ALK, APC*, ATM*, AXIN2*, BAP1*, BARD1*, BLM*, BMPR1A*, BRCA1*, BRCA2*, BRIP1*, CDC73*, CDH1*, CDK4, CDKN1B*, CDKN2A, CHEK2*, CTNNA1*, DICER1*, EPCAM (del/dup only), EGFR, FH*, FLCN*, GREM1 (promoter dup only), HOXB13, KIT, LZTR1, MAX*, MBD4, MEN1*, MET, MITF, MLH1*, MSH2*, MSH3*, MSH6*, MUTYH*, NF1*, NF2*, NTHL1*, PALB2*, PDGFRA, PMS2*, POLD1*, POLE*, POT1*, PRKAR1A*, PTCH1*, PTEN*, RAD51C*, RAD51D*, RB1*, RET, SDHA* (sequencing only), SDHAF2*, SDHB*, SDHC*, SDHD*, SMAD4*, SMARCA4*, SMARCB1*, SMARCE1*, STK11*, SUFU*, TMEM127*, TP53*, TSC1*, TSC2*, VHL*. RNA analysis is performed for * genes.   Negative genetic testing on the common hereditary cancer panel.  The Hereditary Gene Panel offered by [facility] includes sequencing and/or deletion duplication testing of the following 47 genes: APC, ATM, AXIN2, BARD1, BMPR1A, BRCA1, BRCA2, BRIP1, CDH1, CDK4, CDKN2A (p14ARF), CDKN2A (p16INK4a), CHEK2, CTNNA1, DICER1, EPCAM (Deletion/duplication testing only), GREM1 (promoter region deletion/duplication testing only), KIT, MEN1, MLH1, MSH2, MSH3, MSH6, MUTYH, NBN, NF1, NHTL1, PALB2, PDGFRA, PMS2, POLD1, POLE, PTEN, RAD50, RAD51C, RAD51D, SDHB, SDHC, SDHD, SMAD4, SMARCA4. STK11, TP53, TSC1, TSC2, and VHL.  The following genes were evaluated for sequence changes only: SDHA and HOXB13 c.251G>A variant only. The report date is September 25, 2017.      INTERVAL HISTORY:   Rachel Carlson is a 78 y.o. female presenting to clinic today for follow up of Peritoneal carcinomatosis/ovarian cancer. She was last seen by me on 02/11/23.  Since her last visit, results from her genetic testing returned on 02/19/23 and were negative for  any mutations.  Today, she states that she is doing well overall. Her appetite level is at 50%. Her energy level is at 10%.  PAST MEDICAL HISTORY:   Past Medical History: Past Medical History:  Diagnosis Date   Blood transfusion without  reported diagnosis    Chronic right ear pain    normal MRI   DVT of leg (deep venous thrombosis) (HCC)    Esophageal stricture    Family history of breast cancer    Family history of colon cancer    Family history of melanoma    Family history of prostate cancer    Fibroids 1998   uterine, history of (left ovaries)   GERD (gastroesophageal reflux disease)    History of hiatal hernia    History of uterine prolapse    Hx of adenomatous polyp of colon 10/19/2014   Hyperlipidemia    Hypertension    borderline no meds diet and exercise   Neuromuscular disorder (HCC)    neuropathy from chemo   Obesity    Ovarian cancer (HCC)    PVC (premature ventricular contraction)    first dx by holter in 1980's, echo (6/10) EF 55-60%, normal diastolic fxn, normal size RV and fxn, mild MR, PASP   Squamous cell carcinoma of arm    ovarian   Tubal pregnancy    Ulcerative proctosigmoiditis (HCC)    Urge urinary incontinence 06/03/2007   Qualifier: Diagnosis of  By: Lindwood Qua CMA, Jerl Santos      Surgical History: Past Surgical History:  Procedure Laterality Date   BILATERAL SALPINGECTOMY Bilateral 12/12/2022   Procedure: OPEN BILATERAL SALPINGECTOMY-OOPHORECTOMY;  Surgeon: Carver Fila, MD;  Location: [facility];  Service: Gynecology;  Laterality: Bilateral;   BLADDER NECK SUSPENSION  1998   BREAST BIOPSY Left    BREAST LUMPECTOMY Left    benign   CATARACT EXTRACTION Right 01/2020   CATARACT EXTRACTION W/PHACO Left 08/12/2020   Procedure: CATARACT EXTRACTION PHACO AND INTRAOCULAR LENS PLACEMENT LEFT EYE;  Surgeon: Fabio Pierce, MD;  Location: [facility];  Service: Ophthalmology;  Laterality: Left;  left CDE=10.71   COLONOSCOPY  05/18/2011/10/12/14   Dr. Stan Head   DEBULKING N/A 12/12/2022   Procedure: RADICAL TUMOR DEBULKING, PERITONEAL STRIPPING, MOBILIZATION SPLENIC FLEXURE, RECTOSIGMOID RESECTION,SMALL BOWEL RESECTION AND REANASTOMOSIS ,END TO END DESCENDING COLOSTOMY;  Surgeon:  Carver Fila, MD;  Location: [facility];  Service: Gynecology;  Laterality: N/A;   ECTOPIC PREGNANCY SURGERY  1980   ESOPHAGOGASTRODUODENOSCOPY (EGD) WITH ESOPHAGEAL DILATION  04/03/2012   Dr. Stan Head   INCISION AND DRAINAGE ABSCESS N/A 01/02/2023   Procedure: INCISION AND DRAINAGE OF ABDOMINAL WOUND SEROMA;  Surgeon: Antionette Char, MD;  Location: [facility];  Service: Gynecology;  Laterality: N/A;   IR IMAGING GUIDED PORT INSERTION  09/06/2022   OMENTECTOMY N/A 12/12/2022   Procedure: OMENTECTOMY;  Surgeon: Carver Fila, MD;  Location: [facility];  Service: Gynecology;  Laterality: N/A;   PARTIAL HYSTERECTOMY     abdominal, fibroids, and prolapse (1998) bladder tack   SALPINGOOPHORECTOMY Bilateral 12/12/2022   Procedure: DIAGNOSTIC LAPAROSCOPY;  Surgeon: Carver Fila, MD;  Location: [facility];  Service: Gynecology;  Laterality: Bilateral;   SKIN CANCER EXCISION     UPPER GASTROINTESTINAL ENDOSCOPY      Social History: Social History   Socioeconomic History   Marital status: Married    Spouse name: Not on file   Number of children: 3   Years of education: Not on file   Highest education level:  Not on file  Occupational History   Occupation: International aid/development worker    Comment: retired 2009    Employer: unemployed  Tobacco Use   Smoking status: Never   Smokeless tobacco: Never  Vaping Use   Vaping status: Never Used  Substance and Sexual Activity   Alcohol use: No    Alcohol/week: 0.0 standard drinks of alcohol    Comment: rarely beer   Drug use: No   Sexual activity: Not Currently  Other Topics Concern   Not on file  Social History Narrative   Retired International aid/development worker, 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)   Grown children   Never smoker no alcohol tobacco or drug use   Social Drivers of Corporate investment banker Strain: Low Risk  (08/28/2022)   Overall Financial Resource Strain (CARDIA)    Difficulty of Paying Living Expenses: Not hard at all  Food Insecurity:  No Food Insecurity (01/10/2023)   Hunger Vital Sign    Worried About Running Out of Food in the Last Year: Never true    Ran Out of Food in the Last Year: Never true  Transportation Needs: No Transportation Needs (01/10/2023)   PRAPARE - Administrator, Civil Service (Medical): No    Lack of Transportation (Non-Medical): No  Physical Activity: Inactive (08/28/2022)   Exercise Vital Sign    Days of Exercise per Week: 0 days    Minutes of Exercise per Session: 0 min  Stress: No Stress Concern Present (08/28/2022)   [employer] of Occupational Health - Occupational Stress Questionnaire    Feeling of Stress : Not at all  Social Connections: Moderately Integrated (08/28/2022)   Social Connection and Isolation Panel [NHANES]    Frequency of Communication with Friends and Family: More than three times a week    Frequency of Social Gatherings with Friends and Family: More than three times a week    Attends Religious Services: Never    Database administrator or Organizations: Yes    Attends Engineer, structural: More than 4 times per year    Marital Status: Married  Catering manager Violence: Not At Risk (01/10/2023)   Humiliation, Afraid, Rape, and Kick questionnaire    Fear of Current or Ex-Partner: No    Emotionally Abused: No    Physically Abused: No    Sexually Abused: No    Family History: Family History  Problem Relation Age of Onset   Heart attack Mother 79   Transient ischemic attack Mother    Diabetes type II Mother    Sudden death Mother        died age 78   Diabetes Mother    Coronary artery disease Mother    Skin cancer Mother    Barrett's esophagus Mother    Sudden death Father 68       "coronary arteriosclerosis" on death certificate   Breast cancer Sister        dx in her 63s   Colon polyps Sister        adenomatous   Arrhythmia Brother    Colon cancer Brother 79   Congestive Heart Failure Maternal Grandmother    Prostate cancer  Maternal Grandfather        dx in 41s   Tuberculosis Paternal Grandfather    Ehlers-Danlos syndrome Daughter    Colon cancer Son 40       lynch syndrome   Other Son        parotid gland tumor  Skin cancer Maternal Aunt    Barrett's esophagus Maternal Aunt    Melanoma Maternal Uncle    Colon cancer Cousin        mid 39s; maternal cousin   Colon cancer Other        MGMs brother   Esophageal cancer Neg Hx    Stomach cancer Neg Hx    Rectal cancer Neg Hx    Uterine cancer Neg Hx    Bladder Cancer Neg Hx     Current Medications:  Current Outpatient Medications:    apixaban (ELIQUIS) 5 MG TABS tablet, Take 1 tablet (5 mg total) by mouth 2 (two) times daily., Disp: 180 tablet, Rfl: 0   Ascorbic Acid (VITAMIN C) 1000 MG tablet, Take 1,000 mg by mouth daily., Disp: , Rfl:    calcium citrate-vitamin D (CITRACAL+D) 315-200 MG-UNIT per tablet, Take 1 tablet by mouth 2 (two) times daily., Disp: , Rfl:    CARBOPLATIN IV, Inject into the vein every 21 ( twenty-one) days. port, Disp: , Rfl:    Cholecalciferol (VITAMIN D-3) 5000 UNITS TABS, Take 5,000 Units by mouth daily., Disp: , Rfl:    ciprofloxacin (CIPRO) 500 MG tablet, Take 1 tablet (500 mg total) by mouth 2 (two) times daily., Disp: 20 tablet, Rfl: 0   Coenzyme Q10 (CO Q-10) 200 MG CAPS, Take 200 mg by mouth daily., Disp: , Rfl:    CRANBERRY PO, Take 650 mg by mouth daily., Disp: , Rfl:    Glucosamine-Chondroitin (GLUCOSAMINE CHONDR COMPLEX PO), Take 2 capsules by mouth daily., Disp: , Rfl:    GRAPE SEED EXTRACT PO, Take 1 capsule by mouth daily., Disp: , Rfl:    ketoconazole (NIZORAL) 2 % cream, Apply 1 Application topically daily. To yeast rash, Disp: 30 g, Rfl: 1   lansoprazole (PREVACID) 15 MG capsule, Take 1 capsule (15 mg total) by mouth 2 (two) times daily before a meal., Disp: , Rfl:    lidocaine-prilocaine (EMLA) cream, Apply to affected area once (Patient taking differently: Apply 1 Application topically daily as needed (prior  to port access).), Disp: 30 g, Rfl: 3   milk thistle 175 MG tablet, Take 175 mg by mouth daily., Disp: , Rfl:    mirtazapine (REMERON) 15 MG tablet, Take 1 tablet (15 mg total) by mouth at bedtime., Disp: 30 tablet, Rfl: 4   Omega-3 Fatty Acids (FISH OIL TRIPLE STRENGTH PO), Take 1 capsule by mouth daily., Disp: , Rfl:    ondansetron (ZOFRAN) 8 MG tablet, Take 1 tablet (8 mg total) by mouth every 8 (eight) hours as needed for nausea or vomiting. Start on the third day after carboplatin., Disp: 30 tablet, Rfl: 1   PACLITAXEL IV, Inject into the vein every 21 ( twenty-one) days. port, Disp: , Rfl:    phenazopyridine (PYRIDIUM) 200 MG tablet, Take 1 tablet (200 mg total) by mouth 3 (three) times daily as needed for pain., Disp: 10 tablet, Rfl: 0   Polyethyl Glycol-Propyl Glycol (SYSTANE OP), Place 1 drop into both eyes 4 (four) times daily as needed (for dryness)., Disp: , Rfl:    PREVACID 24HR 15 MG capsule, Take 15 mg by mouth See admin instructions. Take 15 mg by mouth in the morning before any food and an additional 15 mg once a day as needed for unresolved reflux, Disp: , Rfl:    prochlorperazine (COMPAZINE) 10 MG tablet, Take 10 mg by mouth every 6 (six) hours as needed for nausea or vomiting., Disp: , Rfl:  rosuvastatin (CRESTOR) 20 MG tablet, Take 1 tablet (20 mg total) by mouth at bedtime., Disp: 90 tablet, Rfl: 3   simethicone (MYLICON) 80 MG chewable tablet, Chew 1 tablet (80 mg total) by mouth 4 (four) times daily as needed for flatulence., Disp: 30 tablet, Rfl: 4   temazepam (RESTORIL) 15 MG capsule, Take 2 capsules (30 mg total) by mouth at bedtime as needed for sleep., Disp: 60 capsule, Rfl: 0   traMADol (ULTRAM) 50 MG tablet, Take 1 tablet (50 mg total) by mouth every 6 (six) hours as needed for severe pain (pain score 7-10). For AFTER surgery only, do not take and drive, Disp: 15 tablet, Rfl: 0   traZODone (DESYREL) 50 MG tablet, Take 1 tablet (50 mg total) by mouth at bedtime as needed  for sleep., Disp: 30 tablet, Rfl: 1   vitamin B-12 (CYANOCOBALAMIN) 1000 MCG tablet, Take 1,000 mcg by mouth daily., Disp: , Rfl:    zinc gluconate 50 MG tablet, Take 50 mg by mouth daily., Disp: , Rfl:    Allergies: Allergies  Allergen Reactions   Aspartame     Intolerance to artifical sweeteners, pt states caused burning of bladder   Levaquin [Levofloxacin] Nausea Only   Pantoprazole Sodium Diarrhea    Abdominal pain   Ranitidine Diarrhea    abdominal pain   Omeprazole Diarrhea    Abdominal pain  Other Reaction(s): Not available   Sulfasalazine Rash and Other (See Comments)    Fever, chills, headache, muscle pain also    REVIEW OF SYSTEMS:   Review of Systems  Constitutional:  Negative for chills, fatigue and fever.  HENT:   Negative for lump/mass, mouth sores, nosebleeds, sore throat and trouble swallowing.   Eyes:  Negative for eye problems.  Respiratory:  Positive for shortness of breath. Negative for cough.   Cardiovascular:  Negative for chest pain, leg swelling and palpitations.  Gastrointestinal:  Negative for abdominal pain, constipation, diarrhea, nausea and vomiting.  Genitourinary:  Negative for bladder incontinence, difficulty urinating, dysuria, frequency, hematuria and nocturia.   Musculoskeletal:  Negative for arthralgias, back pain, flank pain, myalgias and neck pain.  Skin:  Negative for itching and rash.  Neurological:  Positive for numbness. Negative for dizziness and headaches.  Hematological:  Does not bruise/bleed easily.  Psychiatric/Behavioral:  Positive for sleep disturbance. Negative for depression and suicidal ideas. The patient is not nervous/anxious.   All other systems reviewed and are negative.    VITALS:   Weight 161 lb 13.1 oz (73.4 kg), last menstrual period 05/29/1996.  Wt Readings from Last 3 Encounters:  03/04/23 161 lb 13.1 oz (73.4 kg)  02/11/23 159 lb 9.8 oz (72.4 kg)  01/17/23 160 lb 3.2 oz (72.7 kg)    Body mass index is 29.6  kg/m.  Performance status (ECOG): 1 - Symptomatic but completely ambulatory  PHYSICAL EXAM:   Physical Exam Vitals and nursing note reviewed. Exam conducted with a chaperone present.  Constitutional:      Appearance: Normal appearance.  Cardiovascular:     Rate and Rhythm: Normal rate and regular rhythm.     Pulses: Normal pulses.     Heart sounds: Normal heart sounds.  Pulmonary:     Effort: Pulmonary effort is normal.     Breath sounds: Normal breath sounds.  Abdominal:     Palpations: Abdomen is soft. There is no hepatomegaly, splenomegaly or mass.     Tenderness: There is no abdominal tenderness.  Musculoskeletal:     Right lower leg:  No edema.     Left lower leg: No edema.  Lymphadenopathy:     Cervical: No cervical adenopathy.     Right cervical: No superficial, deep or posterior cervical adenopathy.    Left cervical: No superficial, deep or posterior cervical adenopathy.     Upper Body:     Right upper body: No supraclavicular or axillary adenopathy.     Left upper body: No supraclavicular or axillary adenopathy.  Neurological:     General: No focal deficit present.     Mental Status: She is alert and oriented to person, place, and time.  Psychiatric:        Mood and Affect: Mood normal.        Behavior: Behavior normal.     LABS:   CBC     Component Value Date/Time   WBC 3.9 (L) 03/04/2023 0820   RBC 3.57 (L) 03/04/2023 0820   HGB 10.9 (L) 03/04/2023 0820   HGB 9.8 (L) 12/31/2022 1551   HCT 33.1 (L) 03/04/2023 0820   PLT 219 03/04/2023 0820   PLT 361 12/31/2022 1551   MCV 92.7 03/04/2023 0820   MCH 30.5 03/04/2023 0820   MCHC 32.9 03/04/2023 0820   RDW 15.0 03/04/2023 0820   LYMPHSABS 0.7 03/04/2023 0820   MONOABS 0.6 03/04/2023 0820   EOSABS 0.0 03/04/2023 0820   BASOSABS 0.0 03/04/2023 0820    CMP      Component Value Date/Time   NA 137 03/04/2023 0820   K 3.8 03/04/2023 0820   CL 105 03/04/2023 0820   CO2 23 03/04/2023 0820   GLUCOSE  104 (H) 03/04/2023 0820   BUN 14 03/04/2023 0820   CREATININE 0.56 03/04/2023 0820   CALCIUM 9.3 03/04/2023 0820   PROT 6.5 03/04/2023 0820   ALBUMIN 3.5 03/04/2023 0820   AST 15 03/04/2023 0820   ALT 9 03/04/2023 0820   ALKPHOS 57 03/04/2023 0820   BILITOT 0.7 03/04/2023 0820   GFRNONAA >60 03/04/2023 0820   GFRAA 94 06/04/2007 1522     Lab Results  Component Value Date   CEA1 1.0 08/26/2022   /  CEA  Date Value Ref Range Status  08/26/2022 1.0 0.0 - 4.7 ng/mL Final    Comment:    (NOTE)                             Nonsmokers          <3.9                             Smokers             <5.6 [employer] Electrochemiluminescence Immunoassay (ECLIA) Values obtained with different assay methods or kits cannot be used interchangeably.  Results cannot be interpreted as absolute evidence of the presence or absence of malignant disease. Performed At: [facility] [address] [place], [place] [id number] Jolene Schimke MD [id number]    No results found for: "PSA1" No results found for: "CAN199" Lab Results  Component Value Date   CAN125 1,404.0 (H) 02/11/2023    No results found for: "TOTALPROTELP", "ALBUMINELP", "A1GS", "A2GS", "BETS", "BETA2SER", "GAMS", "MSPIKE", "SPEI" Lab Results  Component Value Date   TIBC 369 10/24/2022   FERRITIN 22 10/24/2022   IRONPCTSAT 10 (L) 10/24/2022   No results found for: "LDH"   STUDIES:   No results found.

## 2023-03-04 ENCOUNTER — Inpatient Hospital Stay: Payer: Medicare Other | Admitting: Dietician

## 2023-03-04 ENCOUNTER — Inpatient Hospital Stay: Payer: Medicare Other | Attending: Hematology

## 2023-03-04 ENCOUNTER — Ambulatory Visit (HOSPITAL_COMMUNITY)
Admission: RE | Admit: 2023-03-04 | Discharge: 2023-03-04 | Disposition: A | Discharge status: Home / Self Care | Payer: Medicare Other | Source: Ambulatory Visit | Attending: Hematology | Admitting: Hematology

## 2023-03-04 ENCOUNTER — Inpatient Hospital Stay: Payer: Medicare Other

## 2023-03-04 ENCOUNTER — Encounter: Payer: Self-pay | Admitting: *Deleted

## 2023-03-04 ENCOUNTER — Other Ambulatory Visit: Payer: Self-pay | Admitting: *Deleted

## 2023-03-04 ENCOUNTER — Other Ambulatory Visit: Payer: Self-pay | Admitting: Hematology

## 2023-03-04 ENCOUNTER — Inpatient Hospital Stay: Payer: Medicare Other | Admitting: Hematology

## 2023-03-04 VITALS — Wt 161.8 lb

## 2023-03-04 VITALS — BP 138/77 | HR 87 | Temp 97.8°F | Resp 18

## 2023-03-04 DIAGNOSIS — Z86718 Personal history of other venous thrombosis and embolism: Secondary | ICD-10-CM | POA: Diagnosis not present

## 2023-03-04 DIAGNOSIS — R0602 Shortness of breath: Secondary | ICD-10-CM | POA: Diagnosis not present

## 2023-03-04 DIAGNOSIS — C800 Disseminated malignant neoplasm, unspecified: Secondary | ICD-10-CM | POA: Diagnosis not present

## 2023-03-04 DIAGNOSIS — G629 Polyneuropathy, unspecified: Secondary | ICD-10-CM | POA: Insufficient documentation

## 2023-03-04 DIAGNOSIS — Z807 Family history of other malignant neoplasms of lymphoid, hematopoietic and related tissues: Secondary | ICD-10-CM | POA: Diagnosis not present

## 2023-03-04 DIAGNOSIS — J90 Pleural effusion, not elsewhere classified: Secondary | ICD-10-CM | POA: Insufficient documentation

## 2023-03-04 DIAGNOSIS — Z90710 Acquired absence of both cervix and uterus: Secondary | ICD-10-CM | POA: Diagnosis not present

## 2023-03-04 DIAGNOSIS — C786 Secondary malignant neoplasm of retroperitoneum and peritoneum: Secondary | ICD-10-CM

## 2023-03-04 DIAGNOSIS — Z79899 Other long term (current) drug therapy: Secondary | ICD-10-CM | POA: Diagnosis not present

## 2023-03-04 DIAGNOSIS — Z90722 Acquired absence of ovaries, bilateral: Secondary | ICD-10-CM | POA: Diagnosis not present

## 2023-03-04 DIAGNOSIS — Z5111 Encounter for antineoplastic chemotherapy: Secondary | ICD-10-CM | POA: Insufficient documentation

## 2023-03-04 DIAGNOSIS — G4700 Insomnia, unspecified: Secondary | ICD-10-CM | POA: Diagnosis not present

## 2023-03-04 DIAGNOSIS — Z800 Family history of malignant neoplasm of digestive organs: Secondary | ICD-10-CM | POA: Insufficient documentation

## 2023-03-04 DIAGNOSIS — Z801 Family history of malignant neoplasm of trachea, bronchus and lung: Secondary | ICD-10-CM | POA: Diagnosis not present

## 2023-03-04 DIAGNOSIS — C482 Malignant neoplasm of peritoneum, unspecified: Secondary | ICD-10-CM | POA: Diagnosis not present

## 2023-03-04 DIAGNOSIS — R140 Abdominal distension (gaseous): Secondary | ICD-10-CM | POA: Diagnosis not present

## 2023-03-04 DIAGNOSIS — Z85828 Personal history of other malignant neoplasm of skin: Secondary | ICD-10-CM | POA: Diagnosis not present

## 2023-03-04 DIAGNOSIS — Z7901 Long term (current) use of anticoagulants: Secondary | ICD-10-CM | POA: Diagnosis not present

## 2023-03-04 DIAGNOSIS — R110 Nausea: Secondary | ICD-10-CM | POA: Insufficient documentation

## 2023-03-04 LAB — COMPREHENSIVE METABOLIC PANEL
ALT: 9 U/L (ref 0–44)
AST: 15 U/L (ref 15–41)
Albumin: 3.5 g/dL (ref 3.5–5.0)
Alkaline Phosphatase: 57 U/L (ref 38–126)
Anion gap: 9 (ref 5–15)
BUN: 14 mg/dL (ref 8–23)
CO2: 23 mmol/L (ref 22–32)
Calcium: 9.3 mg/dL (ref 8.9–10.3)
Chloride: 105 mmol/L (ref 98–111)
Creatinine, Ser: 0.56 mg/dL (ref 0.44–1.00)
GFR, Estimated: >60 mL/min (ref >60)
Glucose, Bld: 104 mg/dL — ABNORMAL HIGH (ref 70–99)
Potassium: 3.8 mmol/L (ref 3.5–5.1)
Sodium: 137 mmol/L (ref 135–145)
Total Bilirubin: 0.7 mg/dL (ref 0.0–1.2)
Total Protein: 6.5 g/dL (ref 6.5–8.1)

## 2023-03-04 LAB — CBC WITH DIFFERENTIAL/PLATELET
Abs Immature Granulocytes: 0.01 10*3/uL (ref 0.00–0.07)
Basophils Absolute: 0 10*3/uL (ref 0.0–0.1)
Basophils Relative: 1 %
Eosinophils Absolute: 0 10*3/uL (ref 0.0–0.5)
Eosinophils Relative: 0 %
HCT: 33.1 % — ABNORMAL LOW (ref 36.0–46.0)
Hemoglobin: 10.9 g/dL — ABNORMAL LOW (ref 12.0–15.0)
Immature Granulocytes: 0 %
Lymphocytes Relative: 19 %
Lymphs Abs: 0.7 10*3/uL (ref 0.7–4.0)
MCH: 30.5 pg (ref 26.0–34.0)
MCHC: 32.9 g/dL (ref 30.0–36.0)
MCV: 92.7 fL (ref 80.0–100.0)
Monocytes Absolute: 0.6 10*3/uL (ref 0.1–1.0)
Monocytes Relative: 14 %
Neutro Abs: 2.6 10*3/uL (ref 1.7–7.7)
Neutrophils Relative %: 66 %
Platelets: 219 10*3/uL (ref 150–400)
RBC: 3.57 MIL/uL — ABNORMAL LOW (ref 3.87–5.11)
RDW: 15 % (ref 11.5–15.5)
WBC: 3.9 10*3/uL — ABNORMAL LOW (ref 4.0–10.5)
nRBC: 0 % (ref 0.0–0.2)

## 2023-03-04 LAB — MAGNESIUM: Magnesium: 1.9 mg/dL (ref 1.7–2.4)

## 2023-03-04 MED ORDER — SODIUM CHLORIDE 0.9 % IV SOLN
Freq: Once | INTRAVENOUS | Status: AC
Start: 2023-03-04 — End: 2023-03-04

## 2023-03-04 MED ORDER — CETIRIZINE HCL 10 MG/ML IV SOLN
10.0000 mg | Freq: Once | INTRAVENOUS | Status: AC
  Administered 2023-03-04: 10 mg via INTRAVENOUS
  Filled 2023-03-04: qty 1

## 2023-03-04 MED ORDER — SODIUM CHLORIDE 0.9% FLUSH
10.0000 mL | INTRAVENOUS | Status: DC | PRN
  Administered 2023-03-04: 10 mL

## 2023-03-04 MED ORDER — SODIUM CHLORIDE 0.9% FLUSH
10.0000 mL | Freq: Once | INTRAVENOUS | Status: AC
  Administered 2023-03-04: 10 mL

## 2023-03-04 MED ORDER — TEMAZEPAM 15 MG PO CAPS
30.0000 mg | ORAL_CAPSULE | Freq: Every evening | ORAL | 0 refills | Status: DC | PRN
Start: 2023-03-04 — End: 2023-03-04

## 2023-03-04 MED ORDER — DEXAMETHASONE SODIUM PHOSPHATE 10 MG/ML IJ SOLN
10.0000 mg | Freq: Once | INTRAMUSCULAR | Status: AC
Start: 2023-03-04 — End: 2023-03-04
  Administered 2023-03-04: 10 mg via INTRAVENOUS
  Filled 2023-03-04: qty 1

## 2023-03-04 MED ORDER — FAMOTIDINE IN NACL 20-0.9 MG/50ML-% IV SOLN
20.0000 mg | Freq: Once | INTRAVENOUS | Status: AC
  Administered 2023-03-04: 20 mg via INTRAVENOUS
  Filled 2023-03-04: qty 50

## 2023-03-04 MED ORDER — CARBOPLATIN CHEMO INJECTION 600 MG/60ML
530.0000 mg | Freq: Once | INTRAVENOUS | Status: AC
  Administered 2023-03-04: 530 mg via INTRAVENOUS
  Filled 2023-03-04: qty 53

## 2023-03-04 MED ORDER — SODIUM CHLORIDE 0.9 % IV SOLN
150.0000 mg | Freq: Once | INTRAVENOUS | Status: AC
  Administered 2023-03-04: 150 mg via INTRAVENOUS
  Filled 2023-03-04: qty 150

## 2023-03-04 MED ORDER — HEPARIN SOD (PORK) LOCK FLUSH 100 UNIT/ML IV SOLN
500.0000 [IU] | Freq: Once | INTRAVENOUS | Status: AC | PRN
  Administered 2023-03-04: 500 [IU]

## 2023-03-04 MED ORDER — PALONOSETRON HCL INJECTION 0.25 MG/5ML
0.2500 mg | Freq: Once | INTRAVENOUS | Status: AC
  Administered 2023-03-04: 0.25 mg via INTRAVENOUS
  Filled 2023-03-04: qty 5

## 2023-03-04 MED ORDER — SODIUM CHLORIDE 0.9 % IV SOLN
140.0000 mg/m2 | Freq: Once | INTRAVENOUS | Status: AC
  Administered 2023-03-04: 270 mg via INTRAVENOUS
  Filled 2023-03-04: qty 45

## 2023-03-04 NOTE — Patient Instructions (Signed)
 [facility] at [facility] Discharge Instructions   You were seen and examined today by Dr. Ellin Saba.  He reviewed the results of your lab work which are normal/stable.   We will proceed with your treatment today.  Return as scheduled.    Thank you for choosing [facility] at [facility] to provide your oncology and hematology care.  To afford each patient quality time with our provider, please arrive at least 15 minutes before your scheduled appointment time.   If you have a lab appointment with the Cancer Center please come in thru the Main Entrance and check in at the main information desk.  You need to re-schedule your appointment should you arrive 10 or more minutes late.  We strive to give you quality time with our providers, and arriving late affects you and other patients whose appointments are after yours.  Also, if you no show three or more times for appointments you may be dismissed from the clinic at the providers discretion.     Again, thank you for choosing [facility].  Our hope is that these requests will decrease the amount of time that you wait before being seen by our physicians.       _____________________________________________________________  Should you have questions after your visit to [facility], please contact our office at [phone] and follow the prompts.  Our office hours are 8:00 a.m. and 4:30 p.m. Monday - Friday.  Please note that voicemails left after 4:00 p.m. may not be returned until the following business day.  We are closed weekends and major holidays.  You do have access to a nurse 24-7, just call the main number to the clinic [phone] and do not press any options, hold on the line and a nurse will answer the phone.    For prescription refill requests, have your pharmacy contact our office and allow 72 hours.    Due to Covid, you will need to wear a mask upon entering  the hospital. If you do not have a mask, a mask will be given to you at the Main Entrance upon arrival. For doctor visits, patients may have 1 support person age 67 or older with them. For treatment visits, patients can not have anyone with them due to social distancing guidelines and our immunocompromised population.

## 2023-03-04 NOTE — Progress Notes (Signed)
 Patient has been examined by Dr. Ellin Saba. Vital signs and labs have been reviewed by MD - ANC, Creatinine, LFTs, hemoglobin, and platelets are within treatment parameters per M.D. - pt may proceed with treatment.  Primary RN and pharmacy notified.

## 2023-03-04 NOTE — Patient Instructions (Addendum)
[facility] [place] - A DEPT OF [facility]  Discharge Instructions: Thank you for choosing [facility] to provide your oncology and hematology care.  If you have a lab appointment with the Cancer Center - please note that after April 8th, 2024, all labs will be drawn in the cancer center.  You do not have to check in or register with the main entrance as you have in the past but will complete your check-in in the cancer center.  Wear comfortable clothing and clothing appropriate for easy access to any Portacath or PICC line.   We strive to give you quality time with your provider. You may need to reschedule your appointment if you arrive late (15 or more minutes).  Arriving late affects you and other patients whose appointments are after yours.  Also, if you miss three or more appointments without notifying the office, you may be dismissed from the clinic at the provider's discretion.      For prescription refill requests, have your pharmacy contact our office and allow 72 hours for refills to be completed.    Today you received the following chemotherapy and/or immunotherapy agents Taxol/Carboplatin.  Paclitaxel Injection What is this medication? PACLITAXEL (PAK li TAX el) treats some types of cancer. It works by slowing down the growth of cancer cells. This medicine may be used for other purposes; ask your health care provider or pharmacist if you have questions. COMMON BRAND NAME(S): Onxol, Taxol What should I tell my care team before I take this medication? They need to know if you have any of these conditions: Heart disease Liver disease Low white blood cell levels An unusual or allergic reaction to paclitaxel, other medications, foods, dyes, or preservatives If you or your partner are pregnant or trying to get pregnant Breast-feeding How should I use this medication? This medication is injected into a vein. It is given by your care team in a  hospital or clinic setting. Talk to your care team about the use of this medication in children. While it may be given to children for selected conditions, precautions do apply. Overdosage: If you think you have taken too much of this medicine contact a poison control center or emergency room at once. NOTE: This medicine is only for you. Do not share this medicine with others. What if I miss a dose? Keep appointments for follow-up doses. It is important not to miss your dose. Call your care team if you are unable to keep an appointment. What may interact with this medication? Do not take this medication with any of the following: Live virus vaccines Other medications may affect the way this medication works. Talk with your care team about all of the medications you take. They may suggest changes to your treatment plan to lower the risk of side effects and to make sure your medications work as intended. This list may not describe all possible interactions. Give your health care provider a list of all the medicines, herbs, non-prescription drugs, or dietary supplements you use. Also tell them if you smoke, drink alcohol, or use illegal drugs. Some items may interact with your medicine. What should I watch for while using this medication? Your condition will be monitored carefully while you are receiving this medication. You may need blood work while taking this medication. This medication may make you feel generally unwell. This is not uncommon as chemotherapy can affect healthy cells as well as cancer cells. Report any  side effects. Continue your course of treatment even though you feel ill unless your care team tells you to stop. This medication can cause serious allergic reactions. To reduce the risk, your care team may give you other medications to take before receiving this one. Be sure to follow the directions from your care team. This medication may increase your risk of getting an infection.  Call your care team for advice if you get a fever, chills, sore throat, or other symptoms of a cold or flu. Do not treat yourself. Try to avoid being around people who are sick. This medication may increase your risk to bruise or bleed. Call your care team if you notice any unusual bleeding. Be careful brushing or flossing your teeth or using a toothpick because you may get an infection or bleed more easily. If you have any dental work done, tell your dentist you are receiving this medication. Talk to your care team if you may be pregnant. Serious birth defects can occur if you take this medication during pregnancy. Talk to your care team before breastfeeding. Changes to your treatment plan may be needed. What side effects may I notice from receiving this medication? Side effects that you should report to your care team as soon as possible: Allergic reactions--skin rash, itching, hives, swelling of the face, lips, tongue, or throat Heart rhythm changes--fast or irregular heartbeat, dizziness, feeling faint or lightheaded, chest pain, trouble breathing Increase in blood pressure Infection--fever, chills, cough, sore throat, wounds that don't heal, pain or trouble when passing urine, general feeling of discomfort or being unwell Low blood pressure--dizziness, feeling faint or lightheaded, blurry vision Low red blood cell level--unusual weakness or fatigue, dizziness, headache, trouble breathing Painful swelling, warmth, or redness of the skin, blisters or sores at the infusion site Pain, tingling, or numbness in the hands or feet Slow heartbeat--dizziness, feeling faint or lightheaded, confusion, trouble breathing, unusual weakness or fatigue Unusual bruising or bleeding Side effects that usually do not require medical attention (report to your care team if they continue or are bothersome): Diarrhea Hair loss Joint pain Loss of appetite Muscle pain Nausea Vomiting This list may not describe  all possible side effects. Call your doctor for medical advice about side effects. You may report side effects to FDA at 1-800-FDA-1088. Where should I keep my medication? This medication is given in a hospital or clinic. It will not be stored at home. NOTE: This sheet is a summary. It may not cover all possible information. If you have questions about this medicine, talk to your doctor, pharmacist, or health care provider.  2024 Elsevier/Gold Standard (2021-06-06 00:00:00)   Carboplatin Injection What is this medication? CARBOPLATIN (KAR boe pla tin) treats some types of cancer. It works by slowing down the growth of cancer cells. This medicine may be used for other purposes; ask your health care provider or pharmacist if you have questions. COMMON BRAND NAME(S): Paraplatin What should I tell my care team before I take this medication? They need to know if you have any of these conditions: Blood disorders Hearing problems Kidney disease Recent or ongoing radiation therapy An unusual or allergic reaction to carboplatin, cisplatin, other medications, foods, dyes, or preservatives Pregnant or trying to get pregnant Breast-feeding How should I use this medication? This medication is injected into a vein. It is given by your care team in a hospital or clinic setting. Talk to your care team about the use of this medication in children. Special care may be  needed. Overdosage: If you think you have taken too much of this medicine contact a poison control center or emergency room at once. NOTE: This medicine is only for you. Do not share this medicine with others. What if I miss a dose? Keep appointments for follow-up doses. It is important not to miss your dose. Call your care team if you are unable to keep an appointment. What may interact with this medication? Medications for seizures Some antibiotics, such as amikacin, gentamicin, neomycin, streptomycin, tobramycin Vaccines This list may  not describe all possible interactions. Give your health care provider a list of all the medicines, herbs, non-prescription drugs, or dietary supplements you use. Also tell them if you smoke, drink alcohol, or use illegal drugs. Some items may interact with your medicine. What should I watch for while using this medication? Your condition will be monitored carefully while you are receiving this medication. You may need blood work while taking this medication. This medication may make you feel generally unwell. This is not uncommon, as chemotherapy can affect healthy cells as well as cancer cells. Report any side effects. Continue your course of treatment even though you feel ill unless your care team tells you to stop. In some cases, you may be given additional medications to help with side effects. Follow all directions for their use. This medication may increase your risk of getting an infection. Call your care team for advice if you get a fever, chills, sore throat, or other symptoms of a cold or flu. Do not treat yourself. Try to avoid being around people who are sick. Avoid taking medications that contain aspirin, acetaminophen, ibuprofen, naproxen, or ketoprofen unless instructed by your care team. These medications may hide a fever. Be careful brushing or flossing your teeth or using a toothpick because you may get an infection or bleed more easily. If you have any dental work done, tell your dentist you are receiving this medication. Talk to your care team if you wish to become pregnant or think you might be pregnant. This medication can cause serious birth defects. Talk to your care team about effective forms of contraception. Do not breast-feed while taking this medication. What side effects may I notice from receiving this medication? Side effects that you should report to your care team as soon as possible: Allergic reactions--skin rash, itching, hives, swelling of the face, lips, tongue, or  throat Infection--fever, chills, cough, sore throat, wounds that don't heal, pain or trouble when passing urine, general feeling of discomfort or being unwell Low red blood cell level--unusual weakness or fatigue, dizziness, headache, trouble breathing Pain, tingling, or numbness in the hands or feet, muscle weakness, change in vision, confusion or trouble speaking, loss of balance or coordination, trouble walking, seizures Unusual bruising or bleeding Side effects that usually do not require medical attention (report to your care team if they continue or are bothersome): Hair loss Nausea Unusual weakness or fatigue Vomiting This list may not describe all possible side effects. Call your doctor for medical advice about side effects. You may report side effects to FDA at 1-800-FDA-1088. Where should I keep my medication? This medication is given in a hospital or clinic. It will not be stored at home. NOTE: This sheet is a summary. It may not cover all possible information. If you have questions about this medicine, talk to your doctor, pharmacist, or health care provider.  2024 Elsevier/Gold Standard (2021-05-09 00:00:00)  To help prevent nausea and vomiting after your treatment, we encourage  you to take your nausea medication as directed.  BELOW ARE SYMPTOMS THAT SHOULD BE REPORTED IMMEDIATELY: *FEVER GREATER THAN 100.4 F (38 C) OR HIGHER *CHILLS OR SWEATING *NAUSEA AND VOMITING THAT IS NOT CONTROLLED WITH YOUR NAUSEA MEDICATION *UNUSUAL SHORTNESS OF BREATH *UNUSUAL BRUISING OR BLEEDING *URINARY PROBLEMS (pain or burning when urinating, or frequent urination) *BOWEL PROBLEMS (unusual diarrhea, constipation, pain near the anus) TENDERNESS IN MOUTH AND THROAT WITH OR WITHOUT PRESENCE OF ULCERS (sore throat, sores in mouth, or a toothache) UNUSUAL RASH, SWELLING OR PAIN  UNUSUAL VAGINAL DISCHARGE OR ITCHING   Items with * indicate a potential emergency and should be followed up as soon  as possible or go to the Emergency Department if any problems should occur.  Please show the CHEMOTHERAPY ALERT CARD or IMMUNOTHERAPY ALERT CARD at check-in to the Emergency Department and triage nurse.  Should you have questions after your visit or need to cancel or reschedule your appointment, please contact [facility] [place] - A DEPT OF [facility] [phone]  and follow the prompts.  Office hours are 8:00 a.m. to 4:30 p.m. Monday - Friday. Please note that voicemails left after 4:00 p.m. may not be returned until the following business day.  We are closed weekends and major holidays. You have access to a nurse at all times for urgent questions. Please call the main number to the clinic [phone] and follow the prompts.  For any non-urgent questions, you may also contact your provider using MyChart. We now offer e-Visits for anyone 79 and older to request care online for non-urgent symptoms. For details visit [email or web address].   Also download the MyChart app! Go to the app store, search "MyChart", open the app, select [place], and log in with your MyChart username and password.  [facility] [place] - [facility]  Discharge Instructions: Thank you for choosing [facility] to provide your oncology and hematology care.  If you have a lab appointment with the Cancer Center - please note that after April 8th, 2024, all labs will be drawn in the cancer center.  You do not have to check in or register with the main entrance as you have in the past but will complete your check-in in the cancer center.  Wear comfortable clothing and clothing appropriate for easy access to any Portacath or PICC line.   We strive to give you quality time with your provider. You may need to reschedule your appointment if you arrive late (15 or more minutes).  Arriving late affects you and other patients whose appointments are after  yours.  Also, if you miss three or more appointments without notifying the office, you may be dismissed from the clinic at the provider's discretion.      For prescription refill requests, have your pharmacy contact our office and allow 72 hours for refills to be completed.     To help prevent nausea and vomiting after your treatment, we encourage you to take your nausea medication as directed.  BELOW ARE SYMPTOMS THAT SHOULD BE REPORTED IMMEDIATELY: *FEVER GREATER THAN 100.4 F (38 C) OR HIGHER *CHILLS OR SWEATING *NAUSEA AND VOMITING THAT IS NOT CONTROLLED WITH YOUR NAUSEA MEDICATION *UNUSUAL SHORTNESS OF BREATH *UNUSUAL BRUISING OR BLEEDING *URINARY PROBLEMS (pain or burning when urinating, or frequent urination) *BOWEL PROBLEMS (unusual diarrhea, constipation, pain near the anus) TENDERNESS IN MOUTH AND THROAT WITH OR WITHOUT PRESENCE OF ULCERS (sore throat, sores  in mouth, or a toothache) UNUSUAL RASH, SWELLING OR PAIN  UNUSUAL VAGINAL DISCHARGE OR ITCHING   Items with * indicate a potential emergency and should be followed up as soon as possible or go to the Emergency Department if any problems should occur.  Please show the CHEMOTHERAPY ALERT CARD or IMMUNOTHERAPY ALERT CARD at check-in to the Emergency Department and triage nurse.  Should you have questions after your visit or need to cancel or reschedule your appointment, please contact [facility] [place] - A DEPT OF [facility] [phone]  and follow the prompts.  Office hours are 8:00 a.m. to 4:30 p.m. Monday - Friday. Please note that voicemails left after 4:00 p.m. may not be returned until the following business day.  We are closed weekends and major holidays. You have access to a nurse at all times for urgent questions. Please call the main number to the clinic [phone] and follow the prompts.  For any non-urgent questions, you may also contact your provider using MyChart. We now offer e-Visits  for anyone 100 and older to request care online for non-urgent symptoms. For details visit [email or web address].   Also download the MyChart app! Go to the app store, search "MyChart", open the app, select [place], and log in with your MyChart username and password.

## 2023-03-04 NOTE — Progress Notes (Signed)
Patient presents today for chemotherapy infusion. Patient is in satisfactory condition with no new complaints voiced.  Vital signs are stable.  Labs reviewed by Dr. Katragadda during the office visit and all labs are within treatment parameters.  We will proceed with treatment per MD orders.   Patient tolerated treatment well with no complaints voiced.  Patient left ambulatory in stable condition.  Vital signs stable at discharge.  Follow up as scheduled.       

## 2023-03-04 NOTE — Progress Notes (Signed)
Nutrition Follow-up:  Pt with peritoneal carcinomatosis. S/p neoadjuvant chemotherapy followed by debulking surgery 11/13 under the care of Dr. Pricilla Holm. Patient is currently receiving carbo/taxol q21d.   Met with patient in infusion. She reports doing okay. Patient reports altered taste. Some foods taste like the pits. She is eating 2 small meals and drinking 2 Ensure Complete. Recalls chicken breast, purple peas, stewed apples for dinner. 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 Reports output is peanut butter consistency. She stays close to her house as she never knows when her bag might fill. Patient reports increased size in stoma. She is followed by wound care. Patient to follow up with ostomy nurse next week. Patient endorses increased shortness of breath. She suspects she has fluid built up again. Patient has seen MD today.   Medications: reviewed   Labs: Hgb 10.9  Anthropometrics: Wt 161 lb 13.1 oz today   1/13 - 159 lb 8 oz  12/19 - 160 lb 3.2 oz  11/25 - 159 lb 3.2 oz    NUTRITION DIAGNOSIS: Unintended wt loss - stable s/p surgery   INTERVENTION:  Continue 2 Ensure Complete - samples + coupons  Encouraged soft moist textures for ease of intake LCSW to submit additional referral to [employer] regarding ostomy support group Continue colace per MD    MONITORING, EVALUATION, GOAL: wt trends, intake, imaging    NEXT VISIT: Monday February 24 during infusion

## 2023-03-05 ENCOUNTER — Inpatient Hospital Stay: Payer: Medicare Other | Admitting: Licensed Clinical Social Worker

## 2023-03-05 ENCOUNTER — Other Ambulatory Visit (HOSPITAL_COMMUNITY): Payer: Self-pay | Admitting: Hematology

## 2023-03-05 ENCOUNTER — Telehealth: Payer: Self-pay | Admitting: *Deleted

## 2023-03-05 ENCOUNTER — Encounter: Payer: Self-pay | Admitting: Hematology

## 2023-03-05 ENCOUNTER — Other Ambulatory Visit: Payer: Self-pay | Admitting: *Deleted

## 2023-03-05 DIAGNOSIS — J90 Pleural effusion, not elsewhere classified: Secondary | ICD-10-CM

## 2023-03-05 DIAGNOSIS — C579 Malignant neoplasm of female genital organ, unspecified: Secondary | ICD-10-CM

## 2023-03-05 LAB — CA 125: Cancer Antigen (CA) 125: 1131 U/mL — ABNORMAL HIGH (ref 0.0–38.1)

## 2023-03-05 MED ORDER — TEMAZEPAM 30 MG PO CAPS: 30.0000 mg | ORAL_CAPSULE | Freq: Every evening | ORAL | 0 refills | Status: DC | PRN

## 2023-03-05 NOTE — Telephone Encounter (Signed)
 Results of CXR reviewed by Dr. Rogers.  Patient has large left pleural effusion.  Per Dr. Katragadda, US  guided thoracentesis ordered and scheduled.  Patient made aware to be in radiology at [facility] 03/06/23.  Advised her to report to the ER if symptoms become severe.  Verbalized understanding.

## 2023-03-05 NOTE — Progress Notes (Signed)
 [facility] CSW Progress Note  Visual Merchandiser  received a message that pt had not hear from anyone from [employer] for a dance movement psychotherapist.  CSW sent another referral on behalf of pt and booked intake for 2/6 at 4:20.  CSW contacted pt and informed her of the above.  Pt also provided w/ a telephone number to contact [facility] directly if she does not hear from someone.  CSW to remain available as appropriate to provide support throughout duration of treatment.        Devere JONELLE Manna, LCSW Clinical Social Worker [facility]

## 2023-03-06 ENCOUNTER — Ambulatory Visit (HOSPITAL_COMMUNITY)
Admission: RE | Admit: 2023-03-06 | Discharge: 2023-03-06 | Disposition: A | Discharge status: Home / Self Care | Payer: Medicare Other | Source: Ambulatory Visit | Attending: Diagnostic Radiology | Admitting: Diagnostic Radiology

## 2023-03-06 ENCOUNTER — Ambulatory Visit (HOSPITAL_COMMUNITY)
Admission: RE | Admit: 2023-03-06 | Discharge: 2023-03-06 | Disposition: A | Discharge status: Home / Self Care | Payer: Medicare Other | Source: Ambulatory Visit | Attending: Hematology | Admitting: Hematology

## 2023-03-06 ENCOUNTER — Ambulatory Visit: Payer: Medicare Other

## 2023-03-06 ENCOUNTER — Encounter (HOSPITAL_COMMUNITY): Payer: Self-pay

## 2023-03-06 DIAGNOSIS — J90 Pleural effusion, not elsewhere classified: Secondary | ICD-10-CM | POA: Diagnosis not present

## 2023-03-06 DIAGNOSIS — C569 Malignant neoplasm of unspecified ovary: Secondary | ICD-10-CM | POA: Insufficient documentation

## 2023-03-06 DIAGNOSIS — Z48813 Encounter for surgical aftercare following surgery on the respiratory system: Secondary | ICD-10-CM | POA: Diagnosis not present

## 2023-03-06 MED ORDER — LIDOCAINE HCL (PF) 2 % IJ SOLN
10.0000 mL | Freq: Once | INTRAMUSCULAR | Status: AC
  Administered 2023-03-06: 10 mL

## 2023-03-06 MED ORDER — LIDOCAINE HCL (PF) 2 % IJ SOLN
INTRAMUSCULAR | Status: AC
  Filled 2023-03-06: qty 10

## 2023-03-06 NOTE — Progress Notes (Signed)
 Left sided thoracentesis procedure tolerated well today and 2 Liters of dark yellow pleural fluid removed. Patient ambulatory to xray at this time for post chest xray and verbalized understanding of discharge instructions and left after xray read by radiologist with no acute distress noted.

## 2023-03-06 NOTE — Procedures (Signed)
 Interventional Radiology Procedure:   Indications: Large left pleural effusion and shortness of breath  Procedure: US  guided thoracentesis  Findings: Removed 2000 ml from left chest.  Complications: None     EBL: Less than 10 ml  Plan: Follow up CXR   Annikah Lovins R. Philip, MD  Pager: [phone]

## 2023-03-08 ENCOUNTER — Telehealth: Payer: Self-pay | Admitting: *Deleted

## 2023-03-08 NOTE — Telephone Encounter (Signed)
 Spoke with Rachel Carlson and relayed advice from Eleanor Epps, NP that she said take miralax  twice daily and 1 senokot-s in the am and 1 senokot-s in the evening, and back off when things start moving. And pt is going to have to push fluids and hydrate for the medications to work.  Pt verbalized understanding and states it's hard to push fluids. Recommended pt take a cup of water  or fluids every hour to keep it simple and remind herself to hydrate. Also advised pt to call Dr. Charmain office on Monday when they open. Pt agreed and thanked the office for calling back.

## 2023-03-08 NOTE — Telephone Encounter (Signed)
 Spoke with Ms. Nowland who called the office stating she hasn't had any stool output in her ostomy since Monday. Pt states she increased her miralax  to twice daily dose since Tuesday and she only had a small constipated stool on Thursday. Patient also states she had her chemo Treatment of Paclitaxel  and Carboplatin  on Monday and she has been eating and not skipping meals.   Advised patient that the Taxol  and Carbo can cause constipation and she needs to increase her water  and fluid intake and advised to take 2 Senokot-S 3 times a day starting today and continue taking Miralax  twice daily as well. If pt has daily stool and increased output from her ostomy she can hold off on the Senokot-S but continue with Miralax  daily and adjust accordingly. Pt Advised that she needs to start this bowel regimine on the day of each Chemo treatment cycle. Pt verbalized understanding and thanked the office. Pt also advised to call Dr. Charmain office as well and her message would be relayed to providers and the office would call back with any new recommendations.

## 2023-03-11 ENCOUNTER — Ambulatory Visit (HOSPITAL_COMMUNITY)
Admission: RE | Admit: 2023-03-11 | Discharge: 2023-03-11 | Disposition: A | Discharge status: Home / Self Care | Payer: Medicare Other | Source: Ambulatory Visit | Attending: Plastic Surgery | Admitting: Plastic Surgery

## 2023-03-11 DIAGNOSIS — D849 Immunodeficiency, unspecified: Secondary | ICD-10-CM | POA: Insufficient documentation

## 2023-03-11 DIAGNOSIS — Z433 Encounter for attention to colostomy: Secondary | ICD-10-CM | POA: Insufficient documentation

## 2023-03-11 DIAGNOSIS — C482 Malignant neoplasm of peritoneum, unspecified: Secondary | ICD-10-CM | POA: Insufficient documentation

## 2023-03-11 NOTE — Discharge Instructions (Signed)
 Patient was seen in clinic today.  Her son drives her to appointments.

## 2023-03-11 NOTE — Progress Notes (Signed)
[facility]   Reason for visit:  LUQ colostomy  HPI:  Peritoneal carcinoma with resection and colostomy.  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)    Esophageal stricture    Family history of breast cancer    Family history of colon cancer    Family history of melanoma    Family history of prostate cancer    Fibroids 1998   uterine, history of (left ovaries)   GERD (gastroesophageal reflux disease)    History of hiatal hernia    History of uterine prolapse    Hx of adenomatous polyp of colon 10/19/2014   Hyperlipidemia    Hypertension    borderline no meds diet and exercise   Neuromuscular disorder (HCC)    neuropathy from chemo   Obesity    Ovarian cancer (HCC)    PVC (premature ventricular contraction)    first dx by holter in 1980's, echo (6/10) EF 55-60%, normal diastolic fxn, normal size RV and fxn, mild MR, PASP   Squamous cell carcinoma of arm    ovarian   Tubal pregnancy    Ulcerative proctosigmoiditis (HCC)    Urge urinary incontinence 06/03/2007   Qualifier: Diagnosis of  By: Lindwood Qua CMA, Jerl Santos     Family History  Problem Relation Age of Onset   Heart attack Mother 61   Transient ischemic attack Mother    Diabetes type II Mother    Sudden death Mother        died age 69   Diabetes Mother    Coronary artery disease Mother    Skin cancer Mother    Barrett's esophagus Mother    Sudden death Father 60       "coronary arteriosclerosis" on death certificate   Breast cancer Sister        dx in her 3s   Colon polyps Sister        adenomatous   Arrhythmia Brother    Colon cancer Brother 39   Congestive Heart Failure Maternal Grandmother    Prostate cancer Maternal Grandfather        dx in 30s   Tuberculosis Paternal Grandfather    Ehlers-Danlos syndrome Daughter    Colon cancer Son 56       lynch  syndrome   Other Son        parotid gland tumor   Skin cancer Maternal Aunt    Barrett's esophagus Maternal Aunt    Melanoma Maternal Uncle    Colon cancer Cousin        mid 52s; maternal cousin   Colon cancer Other        MGMs brother   Esophageal cancer Neg Hx    Stomach cancer Neg Hx    Rectal cancer Neg Hx    Uterine cancer Neg Hx    Bladder Cancer Neg Hx    Allergies  Allergen Reactions   Aspartame     Intolerance to artifical sweeteners, pt states caused burning of bladder   Levaquin [Levofloxacin] Nausea Only   Pantoprazole Sodium Diarrhea    Abdominal pain   Ranitidine Diarrhea    abdominal pain   Omeprazole Diarrhea    Abdominal pain  Other Reaction(s): Not available   Sulfasalazine Rash and Other (See Comments)    Fever, chills, headache, muscle pain also  Current Outpatient Medications  Medication Sig Dispense Refill Last Dose/Taking   apixaban (ELIQUIS) 5 MG TABS tablet Take 1 tablet (5 mg total) by mouth 2 (two) times daily. 180 tablet 0    Ascorbic Acid (VITAMIN C) 1000 MG tablet Take 1,000 mg by mouth daily.      calcium citrate-vitamin D (CITRACAL+D) 315-200 MG-UNIT per tablet Take 1 tablet by mouth 2 (two) times daily.      CARBOPLATIN IV Inject into the vein every 21 ( twenty-one) days. port      Cholecalciferol (VITAMIN D-3) 5000 UNITS TABS Take 5,000 Units by mouth daily.      ciprofloxacin (CIPRO) 500 MG tablet Take 1 tablet (500 mg total) by mouth 2 (two) times daily. 20 tablet 0    Coenzyme Q10 (CO Q-10) 200 MG CAPS Take 200 mg by mouth daily.      CRANBERRY PO Take 650 mg by mouth daily.      Glucosamine-Chondroitin (GLUCOSAMINE CHONDR COMPLEX PO) Take 2 capsules by mouth daily.      GRAPE SEED EXTRACT PO Take 1 capsule by mouth daily.      ketoconazole (NIZORAL) 2 % cream Apply 1 Application topically daily. To yeast rash 30 g 1    lansoprazole (PREVACID) 15 MG capsule Take 1 capsule (15 mg total) by mouth 2 (two) times daily before a meal.       lidocaine-prilocaine (EMLA) cream Apply to affected area once (Patient taking differently: Apply 1 Application topically daily as needed (prior to port access).) 30 g 3    milk thistle 175 MG tablet Take 175 mg by mouth daily.      mirtazapine (REMERON) 15 MG tablet Take 1 tablet (15 mg total) by mouth at bedtime. 30 tablet 4    Omega-3 Fatty Acids (FISH OIL TRIPLE STRENGTH PO) Take 1 capsule by mouth daily.      ondansetron (ZOFRAN) 8 MG tablet Take 1 tablet (8 mg total) by mouth every 8 (eight) hours as needed for nausea or vomiting. Start on the third day after carboplatin. 30 tablet 1    PACLITAXEL IV Inject into the vein every 21 ( twenty-one) days. port      phenazopyridine (PYRIDIUM) 200 MG tablet Take 1 tablet (200 mg total) by mouth 3 (three) times daily as needed for pain. 10 tablet 0    Polyethyl Glycol-Propyl Glycol (SYSTANE OP) Place 1 drop into both eyes 4 (four) times daily as needed (for dryness).      PREVACID 24HR 15 MG capsule Take 15 mg by mouth See admin instructions. Take 15 mg by mouth in the morning before any food and an additional 15 mg once a day as needed for unresolved reflux      prochlorperazine (COMPAZINE) 10 MG tablet Take 10 mg by mouth every 6 (six) hours as needed for nausea or vomiting.      rosuvastatin (CRESTOR) 20 MG tablet Take 1 tablet (20 mg total) by mouth at bedtime. 90 tablet 3    simethicone (MYLICON) 80 MG chewable tablet Chew 1 tablet (80 mg total) by mouth 4 (four) times daily as needed for flatulence. 30 tablet 4    temazepam (RESTORIL) 30 MG capsule Take 1 capsule (30 mg total) by mouth at bedtime as needed for sleep. 30 capsule 0    traMADol (ULTRAM) 50 MG tablet Take 1 tablet (50 mg total) by mouth every 6 (six) hours as needed for severe pain (pain score 7-10). For AFTER surgery only, do not  take and drive 15 tablet 0    traZODone (DESYREL) 50 MG tablet Take 1 tablet (50 mg total) by mouth at bedtime as needed for sleep. 30 tablet 1    vitamin  B-12 (CYANOCOBALAMIN) 1000 MCG tablet Take 1,000 mcg by mouth daily.      zinc gluconate 50 MG tablet Take 50 mg by mouth daily.      No current facility-administered medications for this encounter.   ROS  Review of Systems  Constitutional:  Positive for fatigue.  Gastrointestinal:        LUQ colostomy  Skin:  Positive for color change.       Peristomal irritation  Allergic/Immunologic: Positive for immunocompromised state.  Psychiatric/Behavioral: Negative.    All other systems reviewed and are negative.  Vital signs:  BP 136/76 (BP Location: Left Arm)   Pulse (!) 102   Temp 98.4 F (36.9 C) (Skin)   Resp 18   Ht 5\' 2"  (1.575 m)   Wt 69.4 kg   LMP 05/29/1996   SpO2 98%   BMI 27.98 kg/m  Exam:  Physical Exam Vitals reviewed.  Cardiovascular:     Comments: Pulse elevated on arrival, down to 92 at conclusion of visit Pulmonary:     Effort: Pulmonary effort is normal.     Breath sounds: Normal breath sounds.  Abdominal:     Palpations: Abdomen is soft.  Musculoskeletal:        General: Normal range of motion.  Skin:    General: Skin is warm and dry.     Findings: Erythema present.  Neurological:     General: No focal deficit present.     Mental Status: She is alert and oriented to person, place, and time. Mental status is at baseline.     Stoma type/location:  LUQ colostomy Stomal assessment/size:  1 1/2" budded pink and moist  productive of soft brown stool Peristomal assessment:  intact Treatment options for stomal/peristomal skin: barrier ring, stoma powder skin prep  barrier strips  1 piece convex Output: soft brown stool Ostomy pouching: 1pc.convex Education provided:  We perform pouch change and she is confident in process.  She is set up with [employer]. Would like M9 deodorizer.    Impression/dx  Colostomy  Discussion  See above supplies secured.  Gaining confidence with pouch/ostomy care Plan  See back as needed.      Visit time: 55 minutes.    Mike Gip FNP-BC

## 2023-03-12 ENCOUNTER — Other Ambulatory Visit (HOSPITAL_COMMUNITY): Payer: Self-pay | Admitting: Nurse Practitioner

## 2023-03-12 DIAGNOSIS — Z433 Encounter for attention to colostomy: Secondary | ICD-10-CM

## 2023-03-18 ENCOUNTER — Ambulatory Visit (HOSPITAL_COMMUNITY)
Admission: RE | Admit: 2023-03-18 | Discharge: 2023-03-18 | Disposition: A | Discharge status: Home / Self Care | Payer: Medicare Other | Source: Ambulatory Visit | Attending: Hematology | Admitting: Hematology

## 2023-03-18 ENCOUNTER — Telehealth: Payer: Self-pay | Admitting: Surgery

## 2023-03-18 DIAGNOSIS — C786 Secondary malignant neoplasm of retroperitoneum and peritoneum: Secondary | ICD-10-CM | POA: Insufficient documentation

## 2023-03-18 DIAGNOSIS — I700 Atherosclerosis of aorta: Secondary | ICD-10-CM | POA: Diagnosis not present

## 2023-03-18 DIAGNOSIS — C800 Disseminated malignant neoplasm, unspecified: Secondary | ICD-10-CM | POA: Insufficient documentation

## 2023-03-18 MED ORDER — IOHEXOL 300 MG/ML  SOLN
100.0000 mL | Freq: Once | INTRAMUSCULAR | Status: AC | PRN
Start: 2023-03-18 — End: 2023-03-18
  Administered 2023-03-18: 100 mL via INTRAVENOUS

## 2023-03-18 NOTE — Telephone Encounter (Signed)
Patient called stating she has "pea shaped structures" on her stoma. She states she had 1-2 last time she changed her bag but that today she changed her bag before her CT scan appt and noticed 3-4 more. Patient states "structures" are not painful, they are not inflamed, no drainage or bleeding, they are the same color as her stoma. Patient denies any fevers or change in her stools. Advised patient to call Mike Gip at the ostomy clinic to request additional follow up. Advised patient that Dr Pricilla Holm would be notified of her concerns as well. Patient verbalized understanding and stated she would call the ostomy clinic today.

## 2023-03-19 ENCOUNTER — Telehealth: Payer: Self-pay | Admitting: *Deleted

## 2023-03-19 NOTE — Telephone Encounter (Signed)
Spoke with Ms. Morell who states she is having regular stools that are the consistency of semi-runny pancakes. Pt also states she called the [facility] yesterday and spoke with Wilkie Aye? Who advised pt that the pea shaped structures on her stoma could be granulomas. Pt has a follow up appointment with [facility] on Friday, Feb. 28th. Pt thanked the office for calling and advised to call with any concerns or questions.

## 2023-03-21 DIAGNOSIS — Z933 Colostomy status: Secondary | ICD-10-CM | POA: Diagnosis not present

## 2023-03-25 ENCOUNTER — Inpatient Hospital Stay: Payer: Medicare Other | Admitting: Hematology

## 2023-03-25 ENCOUNTER — Other Ambulatory Visit (HOSPITAL_COMMUNITY): Payer: Self-pay

## 2023-03-25 ENCOUNTER — Telehealth: Payer: Self-pay | Admitting: Pharmacy Technician

## 2023-03-25 ENCOUNTER — Inpatient Hospital Stay: Payer: Medicare Other

## 2023-03-25 ENCOUNTER — Encounter: Payer: Self-pay | Admitting: Hematology

## 2023-03-25 ENCOUNTER — Telehealth: Payer: Self-pay

## 2023-03-25 ENCOUNTER — Inpatient Hospital Stay: Payer: Medicare Other | Admitting: Dietician

## 2023-03-25 ENCOUNTER — Encounter: Payer: Self-pay | Admitting: *Deleted

## 2023-03-25 VITALS — BP 136/68 | HR 81 | Temp 97.8°F | Resp 18 | Wt 159.4 lb

## 2023-03-25 VITALS — BP 146/76 | HR 97 | Temp 97.6°F | Resp 16

## 2023-03-25 DIAGNOSIS — Z79899 Other long term (current) drug therapy: Secondary | ICD-10-CM | POA: Diagnosis not present

## 2023-03-25 DIAGNOSIS — R110 Nausea: Secondary | ICD-10-CM | POA: Diagnosis not present

## 2023-03-25 DIAGNOSIS — C800 Disseminated malignant neoplasm, unspecified: Secondary | ICD-10-CM

## 2023-03-25 DIAGNOSIS — Z807 Family history of other malignant neoplasms of lymphoid, hematopoietic and related tissues: Secondary | ICD-10-CM | POA: Diagnosis not present

## 2023-03-25 DIAGNOSIS — C786 Secondary malignant neoplasm of retroperitoneum and peritoneum: Secondary | ICD-10-CM

## 2023-03-25 DIAGNOSIS — C482 Malignant neoplasm of peritoneum, unspecified: Secondary | ICD-10-CM | POA: Diagnosis not present

## 2023-03-25 DIAGNOSIS — Z5111 Encounter for antineoplastic chemotherapy: Secondary | ICD-10-CM | POA: Diagnosis not present

## 2023-03-25 DIAGNOSIS — Z801 Family history of malignant neoplasm of trachea, bronchus and lung: Secondary | ICD-10-CM | POA: Diagnosis not present

## 2023-03-25 DIAGNOSIS — G4700 Insomnia, unspecified: Secondary | ICD-10-CM | POA: Diagnosis not present

## 2023-03-25 DIAGNOSIS — Z7901 Long term (current) use of anticoagulants: Secondary | ICD-10-CM | POA: Diagnosis not present

## 2023-03-25 DIAGNOSIS — Z86718 Personal history of other venous thrombosis and embolism: Secondary | ICD-10-CM | POA: Diagnosis not present

## 2023-03-25 DIAGNOSIS — Z85828 Personal history of other malignant neoplasm of skin: Secondary | ICD-10-CM | POA: Diagnosis not present

## 2023-03-25 DIAGNOSIS — Z800 Family history of malignant neoplasm of digestive organs: Secondary | ICD-10-CM | POA: Diagnosis not present

## 2023-03-25 DIAGNOSIS — G629 Polyneuropathy, unspecified: Secondary | ICD-10-CM | POA: Diagnosis not present

## 2023-03-25 DIAGNOSIS — K660 Peritoneal adhesions (postprocedural) (postinfection): Secondary | ICD-10-CM

## 2023-03-25 DIAGNOSIS — J90 Pleural effusion, not elsewhere classified: Secondary | ICD-10-CM | POA: Diagnosis not present

## 2023-03-25 LAB — CBC WITH DIFFERENTIAL/PLATELET
Abs Immature Granulocytes: 0.01 10*3/uL (ref 0.00–0.07)
Basophils Absolute: 0 10*3/uL (ref 0.0–0.1)
Basophils Relative: 1 %
Eosinophils Absolute: 0 10*3/uL (ref 0.0–0.5)
Eosinophils Relative: 1 %
HCT: 34 % — ABNORMAL LOW (ref 36.0–46.0)
Hemoglobin: 10.8 g/dL — ABNORMAL LOW (ref 12.0–15.0)
Immature Granulocytes: 0 %
Lymphocytes Relative: 24 %
Lymphs Abs: 0.9 10*3/uL (ref 0.7–4.0)
MCH: 29.6 pg (ref 26.0–34.0)
MCHC: 31.8 g/dL (ref 30.0–36.0)
MCV: 93.2 fL (ref 80.0–100.0)
Monocytes Absolute: 0.6 10*3/uL (ref 0.1–1.0)
Monocytes Relative: 16 %
Neutro Abs: 2.2 10*3/uL (ref 1.7–7.7)
Neutrophils Relative %: 58 %
Platelets: 197 10*3/uL (ref 150–400)
RBC: 3.65 MIL/uL — ABNORMAL LOW (ref 3.87–5.11)
RDW: 15.6 % — ABNORMAL HIGH (ref 11.5–15.5)
WBC: 3.8 10*3/uL — ABNORMAL LOW (ref 4.0–10.5)
nRBC: 0 % (ref 0.0–0.2)

## 2023-03-25 LAB — COMPREHENSIVE METABOLIC PANEL
ALT: 10 U/L (ref 0–44)
AST: 15 U/L (ref 15–41)
Albumin: 3.4 g/dL — ABNORMAL LOW (ref 3.5–5.0)
Alkaline Phosphatase: 58 U/L (ref 38–126)
Anion gap: 8 (ref 5–15)
BUN: 10 mg/dL (ref 8–23)
CO2: 23 mmol/L (ref 22–32)
Calcium: 9.2 mg/dL (ref 8.9–10.3)
Chloride: 107 mmol/L (ref 98–111)
Creatinine, Ser: 0.53 mg/dL (ref 0.44–1.00)
GFR, Estimated: >60 mL/min (ref >60)
Glucose, Bld: 99 mg/dL (ref 70–99)
Potassium: 3.8 mmol/L (ref 3.5–5.1)
Sodium: 138 mmol/L (ref 135–145)
Total Bilirubin: 0.5 mg/dL (ref 0.0–1.2)
Total Protein: 6.4 g/dL — ABNORMAL LOW (ref 6.5–8.1)

## 2023-03-25 LAB — MAGNESIUM: Magnesium: 1.8 mg/dL (ref 1.7–2.4)

## 2023-03-25 MED ORDER — HEPARIN SOD (PORK) LOCK FLUSH 100 UNIT/ML IV SOLN
500.0000 [IU] | Freq: Once | INTRAVENOUS | Status: AC | PRN
  Administered 2023-03-25: 500 [IU]

## 2023-03-25 MED ORDER — SODIUM CHLORIDE 0.9 % IV SOLN
520.0000 mg | Freq: Once | INTRAVENOUS | Status: AC
  Administered 2023-03-25: 520 mg via INTRAVENOUS
  Filled 2023-03-25: qty 52

## 2023-03-25 MED ORDER — PALONOSETRON HCL INJECTION 0.25 MG/5ML
0.2500 mg | Freq: Once | INTRAVENOUS | Status: AC
  Administered 2023-03-25: 0.25 mg via INTRAVENOUS
  Filled 2023-03-25: qty 5

## 2023-03-25 MED ORDER — CETIRIZINE HCL 10 MG/ML IV SOLN
10.0000 mg | Freq: Once | INTRAVENOUS | Status: AC
  Administered 2023-03-25: 10 mg via INTRAVENOUS
  Filled 2023-03-25: qty 1

## 2023-03-25 MED ORDER — SODIUM CHLORIDE 0.9 % IV SOLN
150.0000 mg | Freq: Once | INTRAVENOUS | Status: AC
  Administered 2023-03-25: 150 mg via INTRAVENOUS
  Filled 2023-03-25: qty 150

## 2023-03-25 MED ORDER — NIRAPARIB TOSYLATE 200 MG PO TABS
200.0000 mg | ORAL_TABLET | Freq: Every day | ORAL | 1 refills | Status: DC
  Filled 2023-04-01: qty 30, 30d supply, fill #0
  Filled 2023-05-13 (×2): qty 30, 30d supply, fill #1

## 2023-03-25 MED ORDER — FAMOTIDINE IN NACL 20-0.9 MG/50ML-% IV SOLN
20.0000 mg | Freq: Once | INTRAVENOUS | Status: AC
Start: 2023-03-25 — End: 2023-03-25
  Administered 2023-03-25: 20 mg via INTRAVENOUS
  Filled 2023-03-25: qty 50

## 2023-03-25 MED ORDER — SODIUM CHLORIDE 0.9 % IV SOLN
140.0000 mg/m2 | Freq: Once | INTRAVENOUS | Status: AC
  Administered 2023-03-25: 270 mg via INTRAVENOUS
  Filled 2023-03-25: qty 45

## 2023-03-25 MED ORDER — SODIUM CHLORIDE 0.9 % IV SOLN: Freq: Once | INTRAVENOUS | Status: AC

## 2023-03-25 MED ORDER — DEXAMETHASONE SODIUM PHOSPHATE 10 MG/ML IJ SOLN
10.0000 mg | Freq: Once | INTRAMUSCULAR | Status: AC
  Administered 2023-03-25: 10 mg via INTRAVENOUS
  Filled 2023-03-25: qty 1

## 2023-03-25 MED ORDER — SODIUM CHLORIDE 0.9% FLUSH
10.0000 mL | INTRAVENOUS | Status: DC | PRN
Start: 2023-03-25 — End: 2023-03-25
  Administered 2023-03-25: 10 mL

## 2023-03-25 NOTE — Telephone Encounter (Signed)
 Oral Oncology Patient Advocate Encounter  Prior Authorization for zejula has been approved.    PA# [id number] Effective dates: 03/25/2023 through 01/29/2024  Patients co-pay is $1,989.41.    Patty Almedia Balls, CPhT Oncology Pharmacy Patient Advocate [facility] Direct Number: [phone] Fax: [phone]

## 2023-03-25 NOTE — Patient Instructions (Signed)
 [facility]  Discharge Instructions: Thank you for choosing [facility] to provide your oncology and hematology care.  If you have a lab appointment with the Cancer Center - please note that after April 8th, 2024, all labs will be drawn in the cancer center.  You do not have to check in or register with the main entrance as you have in the past but will complete your check-in in the cancer center.  Wear comfortable clothing and clothing appropriate for easy access to any Portacath or PICC line.   We strive to give you quality time with your provider. You may need to reschedule your appointment if you arrive late (15 or more minutes).  Arriving late affects you and other patients whose appointments are after yours.  Also, if you miss three or more appointments without notifying the office, you may be dismissed from the clinic at the provider's discretion.      For prescription refill requests, have your pharmacy contact our office and allow 72 hours for refills to be completed.    Today you received the following chemotherapy and/or immunotherapy agents Paclitaxel and Carboplatin   To help prevent nausea and vomiting after your treatment, we encourage you to take your nausea medication as directed.  Paclitaxel Injection What is this medication? PACLITAXEL (PAK li TAX el) treats some types of cancer. It works by slowing down the growth of cancer cells. This medicine may be used for other purposes; ask your health care provider or pharmacist if you have questions. COMMON BRAND NAME(S): Onxol, Taxol What should I tell my care team before I take this medication? They need to know if you have any of these conditions: Heart disease Liver disease Low white blood cell levels An unusual or allergic reaction to paclitaxel, other medications, foods, dyes, or preservatives If you or your partner are pregnant or trying to get  pregnant Breast-feeding How should I use this medication? This medication is injected into a vein. It is given by your care team in a hospital or clinic setting. Talk to your care team about the use of this medication in children. While it may be given to children for selected conditions, precautions do apply. Overdosage: If you think you have taken too much of this medicine contact a poison control center or emergency room at once. NOTE: This medicine is only for you. Do not share this medicine with others. What if I miss a dose? Keep appointments for follow-up doses. It is important not to miss your dose. Call your care team if you are unable to keep an appointment. What may interact with this medication? Do not take this medication with any of the following: Live virus vaccines Other medications may affect the way this medication works. Talk with your care team about all of the medications you take. They may suggest changes to your treatment plan to lower the risk of side effects and to make sure your medications work as intended. This list may not describe all possible interactions. Give your health care provider a list of all the medicines, herbs, non-prescription drugs, or dietary supplements you use. Also tell them if you smoke, drink alcohol, or use illegal drugs. Some items may interact with your medicine. What should I watch for while using this medication? Your condition will be monitored carefully while you are receiving this medication. You may need blood work while taking this medication. This medication  may make you feel generally unwell. This is not uncommon as chemotherapy can affect healthy cells as well as cancer cells. Report any side effects. Continue your course of treatment even though you feel ill unless your care team tells you to stop. This medication can cause serious allergic reactions. To reduce the risk, your care team may give you other medications to take before  receiving this one. Be sure to follow the directions from your care team. This medication may increase your risk of getting an infection. Call your care team for advice if you get a fever, chills, sore throat, or other symptoms of a cold or flu. Do not treat yourself. Try to avoid being around people who are sick. This medication may increase your risk to bruise or bleed. Call your care team if you notice any unusual bleeding. Be careful brushing or flossing your teeth or using a toothpick because you may get an infection or bleed more easily. If you have any dental work done, tell your dentist you are receiving this medication. Talk to your care team if you may be pregnant. Serious birth defects can occur if you take this medication during pregnancy. Talk to your care team before breastfeeding. Changes to your treatment plan may be needed. What side effects may I notice from receiving this medication? Side effects that you should report to your care team as soon as possible: Allergic reactions--skin rash, itching, hives, swelling of the face, lips, tongue, or throat Heart rhythm changes--fast or irregular heartbeat, dizziness, feeling faint or lightheaded, chest pain, trouble breathing Increase in blood pressure Infection--fever, chills, cough, sore throat, wounds that don't heal, pain or trouble when passing urine, general feeling of discomfort or being unwell Low blood pressure--dizziness, feeling faint or lightheaded, blurry vision Low red blood cell level--unusual weakness or fatigue, dizziness, headache, trouble breathing Painful swelling, warmth, or redness of the skin, blisters or sores at the infusion site Pain, tingling, or numbness in the hands or feet Slow heartbeat--dizziness, feeling faint or lightheaded, confusion, trouble breathing, unusual weakness or fatigue Unusual bruising or bleeding Side effects that usually do not require medical attention (report to your care team if they  continue or are bothersome): Diarrhea Hair loss Joint pain Loss of appetite Muscle pain Nausea Vomiting This list may not describe all possible side effects. Call your doctor for medical advice about side effects. You may report side effects to FDA at 1-800-FDA-1088. Where should I keep my medication? This medication is given in a hospital or clinic. It will not be stored at home. NOTE: This sheet is a summary. It may not cover all possible information. If you have questions about this medicine, talk to your doctor, pharmacist, or health care provider.  2024 Elsevier/Gold Standard (2021-06-06 00:00:00)  Carboplatin Injection What is this medication? CARBOPLATIN (KAR boe pla tin) treats some types of cancer. It works by slowing down the growth of cancer cells. This medicine may be used for other purposes; ask your health care provider or pharmacist if you have questions. COMMON BRAND NAME(S): Paraplatin What should I tell my care team before I take this medication? They need to know if you have any of these conditions: Blood disorders Hearing problems Kidney disease Recent or ongoing radiation therapy An unusual or allergic reaction to carboplatin, cisplatin, other medications, foods, dyes, or preservatives Pregnant or trying to get pregnant Breast-feeding How should I use this medication? This medication is injected into a vein. It is given by your care team in  a hospital or clinic setting. Talk to your care team about the use of this medication in children. Special care may be needed. Overdosage: If you think you have taken too much of this medicine contact a poison control center or emergency room at once. NOTE: This medicine is only for you. Do not share this medicine with others. What if I miss a dose? Keep appointments for follow-up doses. It is important not to miss your dose. Call your care team if you are unable to keep an appointment. What may interact with this  medication? Medications for seizures Some antibiotics, such as amikacin, gentamicin, neomycin, streptomycin, tobramycin Vaccines This list may not describe all possible interactions. Give your health care provider a list of all the medicines, herbs, non-prescription drugs, or dietary supplements you use. Also tell them if you smoke, drink alcohol, or use illegal drugs. Some items may interact with your medicine. What should I watch for while using this medication? Your condition will be monitored carefully while you are receiving this medication. You may need blood work while taking this medication. This medication may make you feel generally unwell. This is not uncommon, as chemotherapy can affect healthy cells as well as cancer cells. Report any side effects. Continue your course of treatment even though you feel ill unless your care team tells you to stop. In some cases, you may be given additional medications to help with side effects. Follow all directions for their use. This medication may increase your risk of getting an infection. Call your care team for advice if you get a fever, chills, sore throat, or other symptoms of a cold or flu. Do not treat yourself. Try to avoid being around people who are sick. Avoid taking medications that contain aspirin, acetaminophen, ibuprofen, naproxen, or ketoprofen unless instructed by your care team. These medications may hide a fever. Be careful brushing or flossing your teeth or using a toothpick because you may get an infection or bleed more easily. If you have any dental work done, tell your dentist you are receiving this medication. Talk to your care team if you wish to become pregnant or think you might be pregnant. This medication can cause serious birth defects. Talk to your care team about effective forms of contraception. Do not breast-feed while taking this medication. What side effects may I notice from receiving this medication? Side effects  that you should report to your care team as soon as possible: Allergic reactions--skin rash, itching, hives, swelling of the face, lips, tongue, or throat Infection--fever, chills, cough, sore throat, wounds that don't heal, pain or trouble when passing urine, general feeling of discomfort or being unwell Low red blood cell level--unusual weakness or fatigue, dizziness, headache, trouble breathing Pain, tingling, or numbness in the hands or feet, muscle weakness, change in vision, confusion or trouble speaking, loss of balance or coordination, trouble walking, seizures Unusual bruising or bleeding Side effects that usually do not require medical attention (report to your care team if they continue or are bothersome): Hair loss Nausea Unusual weakness or fatigue Vomiting This list may not describe all possible side effects. Call your doctor for medical advice about side effects. You may report side effects to FDA at 1-800-FDA-1088. Where should I keep my medication? This medication is given in a hospital or clinic. It will not be stored at home. NOTE: This sheet is a summary. It may not cover all possible information. If you have questions about this medicine, talk to your doctor, pharmacist,  or health care provider.  2024 Elsevier/Gold Standard (2021-05-09 00:00:00)  BELOW ARE SYMPTOMS THAT SHOULD BE REPORTED IMMEDIATELY: *FEVER GREATER THAN 100.4 F (38 C) OR HIGHER *CHILLS OR SWEATING *NAUSEA AND VOMITING THAT IS NOT CONTROLLED WITH YOUR NAUSEA MEDICATION *UNUSUAL SHORTNESS OF BREATH *UNUSUAL BRUISING OR BLEEDING *URINARY PROBLEMS (pain or burning when urinating, or frequent urination) *BOWEL PROBLEMS (unusual diarrhea, constipation, pain near the anus) TENDERNESS IN MOUTH AND THROAT WITH OR WITHOUT PRESENCE OF ULCERS (sore throat, sores in mouth, or a toothache) UNUSUAL RASH, SWELLING OR PAIN  UNUSUAL VAGINAL DISCHARGE OR ITCHING   Items with * indicate a potential emergency and  should be followed up as soon as possible or go to the Emergency Department if any problems should occur.  Please show the CHEMOTHERAPY ALERT CARD or IMMUNOTHERAPY ALERT CARD at check-in to the Emergency Department and triage nurse.  Should you have questions after your visit or need to cancel or reschedule your appointment, please contact [facility] [place] - A DEPT OF [facility] [phone]  and follow the prompts.  Office hours are 8:00 a.m. to 4:30 p.m. Monday - Friday. Please note that voicemails left after 4:00 p.m. may not be returned until the following business day.  We are closed weekends and major holidays. You have access to a nurse at all times for urgent questions. Please call the main number to the clinic [phone] and follow the prompts.  For any non-urgent questions, you may also contact your provider using MyChart. We now offer e-Visits for anyone 29 and older to request care online for non-urgent symptoms. For details visit [email or web address].   Also download the MyChart app! Go to the app store, search "MyChart", open the app, select [facility], and log in with your MyChart username and password.

## 2023-03-25 NOTE — Telephone Encounter (Addendum)
 Oral Oncology Patient Advocate Encounter   Received notification that prior authorization for zejula is required.   PA submitted on 03/25/23 Key [id number] Status is pending     Patty Almedia Balls, CPhT Oncology Pharmacy Patient Advocate [facility] Direct Number: [phone] Fax: [phone]

## 2023-03-25 NOTE — Patient Instructions (Addendum)
[facility] at [facility] Discharge Instructions   You were seen and examined today by Dr. Katragadda.  He reviewed the results of your lab work which are normal/stable.   He reviewed the results of your CT scan which is stable.   We will proceed with your treatment today.   Return as scheduled.    Thank you for choosing [facility] at [facility] to provide your oncology and hematology care.  To afford each patient quality time with our provider, please arrive at least 15 minutes before your scheduled appointment time.   If you have a lab appointment with the Cancer Center please come in thru the Main Entrance and check in at the main information desk.  You need to re-schedule your appointment should you arrive 10 or more minutes late.  We strive to give you quality time with our providers, and arriving late affects you and other patients whose appointments are after yours.  Also, if you no show three or more times for appointments you may be dismissed from the clinic at the providers discretion.     Again, thank you for choosing [facility].  Our hope is that these requests will decrease the amount of time that you wait before being seen by our physicians.       _____________________________________________________________  Should you have questions after your visit to [facility], please contact our office at [phone] and follow the prompts.  Our office hours are 8:00 a.m. and 4:30 p.m. Monday - Friday.  Please note that voicemails left after 4:00 p.m. may not be returned until the following business day.  We are closed weekends and major holidays.  You do have access to a nurse 24-7, just call the main number to the clinic [phone] and do not press any options, hold on the line and a nurse will answer the phone.    For prescription refill requests, have your pharmacy contact our office and allow 72 hours.     Due to Covid, you will need to wear a mask upon entering the hospital. If you do not have a mask, a mask will be given to you at the Main Entrance upon arrival. For doctor visits, patients may have 1 support person age 18 or older with them. For treatment visits, patients can not have anyone with them due to social distancing guidelines and our immunocompromised population.      

## 2023-03-25 NOTE — Progress Notes (Signed)
 Patient presents today for Paclitaxel and Carboplatin infusion. Patient is in satisfactory condition with no new complaints voiced.  Vital signs are stable.  Labs reviewed by Dr. Ellin Saba during the office visit and all labs are within treatment parameters.  We will proceed with treatment per MD orders.   Treatment given today per MD orders. Tolerated infusion without adverse affects. Vital signs stable. No complaints at this time. Discharged from clinic ambulatory in stable condition. Alert and oriented x 3. F/U with [facility] as scheduled.

## 2023-03-25 NOTE — Telephone Encounter (Signed)
 Oral Chemotherapy Pharmacist Encounter   Received new prescription for Zejula (niraparib) for the treatment of ovarian cancer following platinum based chemotherapy, planned duration until disease progression or unacceptable drug toxicity.  CBC/CMP from 03/25/23 assessed, no baseline dose adjustments required. Prescription dose and frequency assessed.   Current medication list in Epic reviewed, no DDIs with niraparib identified:  Evaluated chart and no patient barriers to medication adherence identified.   Prescription has been e-scribed to the [facility] for benefits analysis and approval.  Oral Oncology Clinic will continue to follow for insurance authorization, copayment issues, initial counseling and start date.   Jerry Caras, PharmD PGY2 Oncology Pharmacy Resident   03/25/2023 11:37 AM

## 2023-03-25 NOTE — Progress Notes (Signed)
 [facility] [address], [place] [address]    Clinic Day:  03/25/2023  Referring physician: Tower, Audrie Gallus, MD  Patient Care Team: Tower, Audrie Gallus, MD as PCP - General Doreatha Massed, MD as Medical Oncologist (Medical Oncology) Therese Sarah, RN as Oncology Nurse Navigator (Medical Oncology)   ASSESSMENT & PLAN:   Assessment: 1.  Peritoneal carcinomatosis/ovarian cancer: - Presentation: 77-month history of abdominal distention and bloating. - Last colonoscopy (01/01/2020): No evidence of malignancy. - CTAP (08/23/2022): Moderate ascites, probable omental caking in the left lower quadrant, possible small peritoneal implants seen in the pelvis.  Diverticulosis of descending and sigmoid colon with moderate wall thickening of the distal sigmoid colon.  Small bilateral pleural effusions. - 08/26/2022: CA125: 7356, CEA: 1 - Germline mutation testing (09/16/2017): Negative. - 4 cycles of carboplatin and paclitaxel from 09/12/2022 through 11/14/2022 - 12/12/2022: Tumor debulking surgery including total omentectomy, resection of peritoneal disease, small bowel resection with reanastomosis, en bloc resection of the left fallopian tube and ovary as well as rectosigmoid colon, left ureterolysis, right salpingo-oophorectomy, end descending colostomy by Dr. Pricilla Holm - Pathology: Omental resection-high-grade serous carcinoma, cecal implant-high-grade serous carcinoma, ileal resection-mesenteric and serosal involvement by high-grade serous carcinoma, left lower abdominal wall nodule-high-grade serous carcinoma, right tube and ovary resection-high-grade serous carcinoma involving serosa, bladder peritoneum and rectosigmoid colon-high-grade serous carcinoma involving colonic wall and extensive involvement of the serosa and mesenteric adipose tissue.  Metastatic carcinoma involving 8/13 nodes.  pT3 cpN1a. - NGS: MSI-stable, TMB-low, HRD-negative, T p53 pathogenic variant, PD-L1 (22  C3) CPS 5, ER 2+ positive, FOLR1 2+, 75% (mirvetuximab) - Cycle 5 of carboplatin and paclitaxel on 01/17/2023 - Germline mutation testing: Negative   2. Social/Family History: -She lives at home with husband and is his primary caregiver. She is independent ADLS and iADLS. Retired International aid/development worker. No tobacco use.  -She had skin squamous cell carcinoma. Her brother had colon cancer at 54 and follicular non-Hodgkin's lymphoma and DLBCL, her maternal grandfather died of prostate cancer, maternal first cousin had colon cancer, and maternal aunt died of lung cancer (smoker).     Plan: 1.  Peritoneal carcinomatosis/ovarian cancer: - She has completed 3 cycles of carboplatin and paclitaxel after surgery. - No worsening of side effects.  Reports mucus discharge from the rectal area/colon stump. - CA125 improved to 1131. - CT CAP on 03/18/2023 reviewed by me: No significant change in diffuse residual peritoneal thickening without discrete visible nodularity.  Moderate loculated appearing left pleural effusion.  Trace right pleural effusion.  No evidence of lymphadenopathy or metastatic disease in the liver. - We reviewed labs from today: Normal LFTs and creatinine.  Potassium and magnesium are normal.  CBC shows mild leukopenia. - She will proceed with her last cycle of chemotherapy today. - We talked about maintenance therapy with niraparib 200 mg daily until progression or intolerance.  We discussed side effects including hypotension, GI toxicity, anemia, thrombocytopenia, LFT elevation, increased creatinine among others. - We also discussed possible addition of angiogenesis inhibitor bevacizumab as she has recurrent left pleural effusion. - RTC 4 weeks with repeat labs.  She will start niraparib at that time.  If she tolerates interoperatively well, we may consider adding bevacizumab because of her high risk cancer.    2.  Left pleural effusion: - Left thoracentesis on 03/06/2023 with 2 L removed.  Prior  thoracentesis was on 12/10/2022. - Will arrange for thoracentesis every 8-12 weeks as needed.   3.  Left gastrocnemius/calf vein  distal DVT (Dx 07/24/2022): - Continue Eliquis twice daily.  No bleeding issues reported.   4.  Peripheral neuropathy: - Tingling in the fingertips is constant and stable.  Neuropathy in the toes is also stable.   5.  Insomnia: - Trazodone did not help.  Will start her on temazepam 30 mg at bedtime as needed.    Orders Placed This Encounter  Procedures   CA 125    Standing Status:   Future    Expected Date:   04/22/2023    Expiration Date:   03/24/2024      I,Katie Daubenspeck,acting as a scribe for Doreatha Massed, MD.,have documented all relevant documentation on the behalf of Doreatha Massed, MD,as directed by  Doreatha Massed, MD while in the presence of Doreatha Massed, MD.   I, Doreatha Massed MD, have reviewed the above documentation for accuracy and completeness, and I agree with the above.   Doreatha Massed, MD   2/24/20259:31 AM  CHIEF COMPLAINT:   Diagnosis: Peritoneal carcinomatosis/ovarian cancer    Cancer Staging  No matching staging information was found for the patient.    Prior Therapy: 1. Neoadjuvant carboplatin and paclitaxel, 4 cycles, 09/12/22 - 11/14/22 2. Tumor debulking surgery on 12/12/22  Current Therapy:  adjuvant carboplatin and paclitaxel    HISTORY OF PRESENT ILLNESS:   Oncology History Overview Note  CARIS: FOLR1 2+ (75%) ER(2+) HRD-, PDL1 CPS 5%, p53 mutation No mutation in BRCA1/2, ATM, ERBB2, KRAS PR negative   Carcinomatosis (HCC)  08/31/2022 Initial Diagnosis   Carcinomatosis (HCC)   09/12/2022 -  Chemotherapy   Patient is on Treatment Plan : OVARIAN Carboplatin (AUC 6) + Paclitaxel (175) q21d X 6 Cycles     Gynecologic malignancy (HCC)  12/12/2022 Initial Diagnosis   Gynecologic malignancy (HCC)   02/19/2023 Genetic Testing   Negative genetic testing on the Multi-cancer  + RNA panel.  The report date is February 19, 2023.  The Multi-Cancer + RNA Panel offered by [facility] includes sequencing and/or deletion/duplication analysis of the following 70 genes:  AIP*, ALK, APC*, ATM*, AXIN2*, BAP1*, BARD1*, BLM*, BMPR1A*, BRCA1*, BRCA2*, BRIP1*, CDC73*, CDH1*, CDK4, CDKN1B*, CDKN2A, CHEK2*, CTNNA1*, DICER1*, EPCAM (del/dup only), EGFR, FH*, FLCN*, GREM1 (promoter dup only), HOXB13, KIT, LZTR1, MAX*, MBD4, MEN1*, MET, MITF, MLH1*, MSH2*, MSH3*, MSH6*, MUTYH*, NF1*, NF2*, NTHL1*, PALB2*, PDGFRA, PMS2*, POLD1*, POLE*, POT1*, PRKAR1A*, PTCH1*, PTEN*, RAD51C*, RAD51D*, RB1*, RET, SDHA* (sequencing only), SDHAF2*, SDHB*, SDHC*, SDHD*, SMAD4*, SMARCA4*, SMARCB1*, SMARCE1*, STK11*, SUFU*, TMEM127*, TP53*, TSC1*, TSC2*, VHL*. RNA analysis is performed for * genes.   Negative genetic testing on the common hereditary cancer panel.  The Hereditary Gene Panel offered by [facility] includes sequencing and/or deletion duplication testing of the following 47 genes: APC, ATM, AXIN2, BARD1, BMPR1A, BRCA1, BRCA2, BRIP1, CDH1, CDK4, CDKN2A (p14ARF), CDKN2A (p16INK4a), CHEK2, CTNNA1, DICER1, EPCAM (Deletion/duplication testing only), GREM1 (promoter region deletion/duplication testing only), KIT, MEN1, MLH1, MSH2, MSH3, MSH6, MUTYH, NBN, NF1, NHTL1, PALB2, PDGFRA, PMS2, POLD1, POLE, PTEN, RAD50, RAD51C, RAD51D, SDHB, SDHC, SDHD, SMAD4, SMARCA4. STK11, TP53, TSC1, TSC2, and VHL.  The following genes were evaluated for sequence changes only: SDHA and HOXB13 c.251G>A variant only. The report date is September 25, 2017.      INTERVAL HISTORY:   Daje is a 78 y.o. female presenting to clinic today for follow up of Peritoneal carcinomatosis/ovarian cancer. She was last seen by me on 03/04/23.  Since her last visit, she underwent restaging CT C/A/P on 03/18/23 showing: no significant change in diffuse, residual peritoneal thickening throughout  abdomen and pelvis without discretely visible nodularity; unchanged  moderate left pleural effusion; no evidence of lymphadenopathy or directly visualized metastatic disease in chest.  Today, she states that she is doing well overall. Her appetite level is at 50%. Her energy level is at 70%.  PAST MEDICAL HISTORY:   Past Medical History: 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)    Esophageal stricture    Family history of breast cancer    Family history of colon cancer    Family history of melanoma    Family history of prostate cancer    Fibroids 1998   uterine, history of (left ovaries)   GERD (gastroesophageal reflux disease)    History of hiatal hernia    History of uterine prolapse    Hx of adenomatous polyp of colon 10/19/2014   Hyperlipidemia    Hypertension    borderline no meds diet and exercise   Neuromuscular disorder (HCC)    neuropathy from chemo   Obesity    Ovarian cancer (HCC)    PVC (premature ventricular contraction)    first dx by holter in 1980's, echo (6/10) EF 55-60%, normal diastolic fxn, normal size RV and fxn, mild MR, PASP   Squamous cell carcinoma of arm    ovarian   Tubal pregnancy    Ulcerative proctosigmoiditis (HCC)    Urge urinary incontinence 06/03/2007   Qualifier: Diagnosis of  By: Lindwood Qua CMA, Jerl Santos      Surgical History: Past Surgical History:  Procedure Laterality Date   BILATERAL SALPINGECTOMY Bilateral 12/12/2022   Procedure: OPEN BILATERAL SALPINGECTOMY-OOPHORECTOMY;  Surgeon: Carver Fila, MD;  Location: [facility];  Service: Gynecology;  Laterality: Bilateral;   BLADDER NECK SUSPENSION  1998   BREAST BIOPSY Left    BREAST LUMPECTOMY Left    benign   CATARACT EXTRACTION Right 01/2020   CATARACT EXTRACTION W/PHACO Left 08/12/2020   Procedure: CATARACT EXTRACTION PHACO AND INTRAOCULAR LENS PLACEMENT LEFT EYE;  Surgeon: Fabio Pierce, MD;  Location: [facility];  Service: Ophthalmology;  Laterality: Left;  left CDE=10.71   COLONOSCOPY  05/18/2011/10/12/14   Dr. Stan Head   DEBULKING N/A 12/12/2022   Procedure: RADICAL TUMOR DEBULKING, PERITONEAL STRIPPING, MOBILIZATION SPLENIC FLEXURE, RECTOSIGMOID RESECTION,SMALL BOWEL RESECTION AND REANASTOMOSIS ,END TO END DESCENDING COLOSTOMY;  Surgeon: Carver Fila, MD;  Location: [facility];  Service: Gynecology;  Laterality: N/A;   ECTOPIC PREGNANCY SURGERY  1980   ESOPHAGOGASTRODUODENOSCOPY (EGD) WITH ESOPHAGEAL DILATION  04/03/2012   Dr. Stan Head   INCISION AND DRAINAGE ABSCESS N/A 01/02/2023   Procedure: INCISION AND DRAINAGE OF ABDOMINAL WOUND SEROMA;  Surgeon: Antionette Char, MD;  Location: [facility];  Service: Gynecology;  Laterality: N/A;   IR IMAGING GUIDED PORT INSERTION  09/06/2022   OMENTECTOMY N/A 12/12/2022   Procedure: OMENTECTOMY;  Surgeon: Carver Fila, MD;  Location: [facility];  Service: Gynecology;  Laterality: N/A;   PARTIAL HYSTERECTOMY     abdominal, fibroids, and prolapse (1998) bladder tack   SALPINGOOPHORECTOMY Bilateral 12/12/2022   Procedure: DIAGNOSTIC LAPAROSCOPY;  Surgeon: Carver Fila, MD;  Location: [facility];  Service: Gynecology;  Laterality: Bilateral;   SKIN CANCER EXCISION     UPPER GASTROINTESTINAL ENDOSCOPY      Social History: Social History   Socioeconomic History   Marital status: Married    Spouse name: Not  on file   Number of children: 3   Years of education: Not on file   Highest education level: Not on file  Occupational History   Occupation: International aid/development worker    Comment: retired 2009    Employer: unemployed  Tobacco Use   Smoking status: Never   Smokeless tobacco: Never  Vaping Use   Vaping status: Never Used  Substance and Sexual Activity   Alcohol use: No    Alcohol/week: 0.0 standard drinks of alcohol    Comment: rarely beer   Drug use: No   Sexual activity: Not Currently  Other Topics Concern   Not on file  Social History  Narrative   Retired International aid/development worker, 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)   Grown children   Never smoker no alcohol tobacco or drug use   Social Drivers of Corporate investment banker Strain: Low Risk  (08/28/2022)   Overall Financial Resource Strain (CARDIA)    Difficulty of Paying Living Expenses: Not hard at all  Food Insecurity: No Food Insecurity (01/10/2023)   Hunger Vital Sign    Worried About Running Out of Food in the Last Year: Never true    Ran Out of Food in the Last Year: Never true  Transportation Needs: No Transportation Needs (01/10/2023)   PRAPARE - Administrator, Civil Service (Medical): No    Lack of Transportation (Non-Medical): No  Physical Activity: Inactive (08/28/2022)   Exercise Vital Sign    Days of Exercise per Week: 0 days    Minutes of Exercise per Session: 0 min  Stress: No Stress Concern Present (08/28/2022)   [employer] of Occupational Health - Occupational Stress Questionnaire    Feeling of Stress : Not at all  Social Connections: Moderately Integrated (08/28/2022)   Social Connection and Isolation Panel [NHANES]    Frequency of Communication with Friends and Family: More than three times a week    Frequency of Social Gatherings with Friends and Family: More than three times a week    Attends Religious Services: Never    Database administrator or Organizations: Yes    Attends Engineer, structural: More than 4 times per year    Marital Status: Married  Catering manager Violence: Not At Risk (01/10/2023)   Humiliation, Afraid, Rape, and Kick questionnaire    Fear of Current or Ex-Partner: No    Emotionally Abused: No    Physically Abused: No    Sexually Abused: No    Family History: Family History  Problem Relation Age of Onset   Heart attack Mother 33   Transient ischemic attack Mother    Diabetes type II Mother    Sudden death Mother        died age 30   Diabetes Mother    Coronary artery disease Mother     Skin cancer Mother    Barrett's esophagus Mother    Sudden death Father 49       "coronary arteriosclerosis" on death certificate   Breast cancer Sister        dx in her 46s   Colon polyps Sister        adenomatous   Arrhythmia Brother    Colon cancer Brother 54   Congestive Heart Failure Maternal Grandmother    Prostate cancer Maternal Grandfather        dx in 29s   Tuberculosis Paternal Grandfather    Ehlers-Danlos syndrome Daughter    Colon cancer Son 1  lynch syndrome   Other Son        parotid gland tumor   Skin cancer Maternal Aunt    Barrett's esophagus Maternal Aunt    Melanoma Maternal Uncle    Colon cancer Cousin        mid 89s; maternal cousin   Colon cancer Other        MGMs brother   Esophageal cancer Neg Hx    Stomach cancer Neg Hx    Rectal cancer Neg Hx    Uterine cancer Neg Hx    Bladder Cancer Neg Hx     Current Medications:  Current Outpatient Medications:    apixaban (ELIQUIS) 5 MG TABS tablet, Take 1 tablet (5 mg total) by mouth 2 (two) times daily., Disp: 180 tablet, Rfl: 0   Ascorbic Acid (VITAMIN C) 1000 MG tablet, Take 1,000 mg by mouth daily., Disp: , Rfl:    calcium citrate-vitamin D (CITRACAL+D) 315-200 MG-UNIT per tablet, Take 1 tablet by mouth 2 (two) times daily., Disp: , Rfl:    CARBOPLATIN IV, Inject into the vein every 21 ( twenty-one) days. port, Disp: , Rfl:    Cholecalciferol (VITAMIN D-3) 5000 UNITS TABS, Take 5,000 Units by mouth daily., Disp: , Rfl:    ciprofloxacin (CIPRO) 500 MG tablet, Take 1 tablet (500 mg total) by mouth 2 (two) times daily., Disp: 20 tablet, Rfl: 0   Coenzyme Q10 (CO Q-10) 200 MG CAPS, Take 200 mg by mouth daily., Disp: , Rfl:    CRANBERRY PO, Take 650 mg by mouth daily., Disp: , Rfl:    Glucosamine-Chondroitin (GLUCOSAMINE CHONDR COMPLEX PO), Take 2 capsules by mouth daily., Disp: , Rfl:    GRAPE SEED EXTRACT PO, Take 1 capsule by mouth daily., Disp: , Rfl:    ketoconazole (NIZORAL) 2 % cream, Apply 1  Application topically daily. To yeast rash, Disp: 30 g, Rfl: 1   lansoprazole (PREVACID) 15 MG capsule, Take 1 capsule (15 mg total) by mouth 2 (two) times daily before a meal., Disp: , Rfl:    lidocaine-prilocaine (EMLA) cream, Apply to affected area once (Patient taking differently: Apply 1 Application topically daily as needed (prior to port access).), Disp: 30 g, Rfl: 3   milk thistle 175 MG tablet, Take 175 mg by mouth daily., Disp: , Rfl:    mirtazapine (REMERON) 15 MG tablet, Take 1 tablet (15 mg total) by mouth at bedtime., Disp: 30 tablet, Rfl: 4   niraparib tosylate (ZEJULA) 200 MG tablet, Take 1 tablet (200 mg total) by mouth daily. May take at bedtime to reduce nausea and vomiting., Disp: 30 tablet, Rfl: 1   Omega-3 Fatty Acids (FISH OIL TRIPLE STRENGTH PO), Take 1 capsule by mouth daily., Disp: , Rfl:    ondansetron (ZOFRAN) 8 MG tablet, Take 1 tablet (8 mg total) by mouth every 8 (eight) hours as needed for nausea or vomiting. Start on the third day after carboplatin., Disp: 30 tablet, Rfl: 1   PACLITAXEL IV, Inject into the vein every 21 ( twenty-one) days. port, Disp: , Rfl:    phenazopyridine (PYRIDIUM) 200 MG tablet, Take 1 tablet (200 mg total) by mouth 3 (three) times daily as needed for pain., Disp: 10 tablet, Rfl: 0   Polyethyl Glycol-Propyl Glycol (SYSTANE OP), Place 1 drop into both eyes 4 (four) times daily as needed (for dryness)., Disp: , Rfl:    PREVACID 24HR 15 MG capsule, Take 15 mg by mouth See admin instructions. Take 15 mg by mouth in the  morning before any food and an additional 15 mg once a day as needed for unresolved reflux, Disp: , Rfl:    prochlorperazine (COMPAZINE) 10 MG tablet, Take 10 mg by mouth every 6 (six) hours as needed for nausea or vomiting., Disp: , Rfl:    rosuvastatin (CRESTOR) 20 MG tablet, Take 1 tablet (20 mg total) by mouth at bedtime., Disp: 90 tablet, Rfl: 3   simethicone (MYLICON) 80 MG chewable tablet, Chew 1 tablet (80 mg total) by mouth 4  (four) times daily as needed for flatulence., Disp: 30 tablet, Rfl: 4   temazepam (RESTORIL) 30 MG capsule, Take 1 capsule (30 mg total) by mouth at bedtime as needed for sleep., Disp: 30 capsule, Rfl: 0   traMADol (ULTRAM) 50 MG tablet, Take 1 tablet (50 mg total) by mouth every 6 (six) hours as needed for severe pain (pain score 7-10). For AFTER surgery only, do not take and drive, Disp: 15 tablet, Rfl: 0   traZODone (DESYREL) 50 MG tablet, Take 1 tablet (50 mg total) by mouth at bedtime as needed for sleep., Disp: 30 tablet, Rfl: 1   vitamin B-12 (CYANOCOBALAMIN) 1000 MCG tablet, Take 1,000 mcg by mouth daily., Disp: , Rfl:    zinc gluconate 50 MG tablet, Take 50 mg by mouth daily., Disp: , Rfl:  No current facility-administered medications for this visit.  Facility-Administered Medications Ordered in Other Visits:    CARBOplatin (PARAPLATIN) 540 mg in sodium chloride 0.9 % 250 mL chemo infusion, 540 mg, Intravenous, Once, Doreatha Massed, MD   cetirizine (QUZYTTIR) injection 10 mg, 10 mg, Intravenous, Once, Doreatha Massed, MD   dexamethasone (DECADRON) injection 10 mg, 10 mg, Intravenous, Once, Doreatha Massed, MD   famotidine (PEPCID) IVPB 20 mg premix, 20 mg, Intravenous, Once, Doreatha Massed, MD   fosaprepitant (EMEND) 150 mg in sodium chloride 0.9 % 145 mL IVPB, 150 mg, Intravenous, Once, Doreatha Massed, MD   heparin lock flush 100 unit/mL, 500 Units, Intracatheter, Once PRN, Doreatha Massed, MD   PACLitaxel (TAXOL) 270 mg in sodium chloride 0.9 % 250 mL chemo infusion (> 80mg /m2), 140 mg/m2 (Treatment Plan Recorded), Intravenous, Once, Doreatha Massed, MD   palonosetron (ALOXI) injection 0.25 mg, 0.25 mg, Intravenous, Once, Doreatha Massed, MD   sodium chloride flush (NS) 0.9 % injection 10 mL, 10 mL, Intracatheter, PRN, Doreatha Massed, MD   Allergies: Allergies  Allergen Reactions   Aspartame     Intolerance to artifical sweeteners, pt  states caused burning of bladder   Levaquin [Levofloxacin] Nausea Only   Pantoprazole Sodium Diarrhea    Abdominal pain   Ranitidine Diarrhea    abdominal pain   Omeprazole Diarrhea    Abdominal pain  Other Reaction(s): Not available   Sulfasalazine Rash and Other (See Comments)    Fever, chills, headache, muscle pain also    REVIEW OF SYSTEMS:   Review of Systems  Constitutional:  Negative for chills, fatigue and fever.  HENT:   Negative for lump/mass, mouth sores, nosebleeds, sore throat and trouble swallowing.   Eyes:  Negative for eye problems.  Respiratory:  Negative for cough and shortness of breath.   Cardiovascular:  Negative for chest pain, leg swelling and palpitations.  Gastrointestinal:  Negative for abdominal pain, constipation, diarrhea, nausea and vomiting.  Genitourinary:  Negative for bladder incontinence, difficulty urinating, dysuria, frequency, hematuria and nocturia.   Musculoskeletal:  Negative for arthralgias, back pain, flank pain, myalgias and neck pain.  Skin:  Negative for itching and rash.  Neurological:  Positive for numbness. Negative for dizziness and headaches.  Hematological:  Does not bruise/bleed easily.  Psychiatric/Behavioral:  Negative for depression, sleep disturbance and suicidal ideas. The patient is not nervous/anxious.   All other systems reviewed and are negative.    VITALS:   Blood pressure 136/68, pulse 81, temperature 97.8 F (36.6 C), temperature source Tympanic, resp. rate 18, weight 159 lb 6.3 oz (72.3 kg), last menstrual period 05/29/1996, SpO2 98%.  Wt Readings from Last 3 Encounters:  03/25/23 159 lb 6.3 oz (72.3 kg)  03/11/23 153 lb (69.4 kg)  03/04/23 161 lb 13.1 oz (73.4 kg)    Body mass index is 29.15 kg/m.  Performance status (ECOG): 1 - Symptomatic but completely ambulatory  PHYSICAL EXAM:   Physical Exam Vitals and nursing note reviewed. Exam conducted with a chaperone present.  Constitutional:       Appearance: Normal appearance.  Cardiovascular:     Rate and Rhythm: Normal rate and regular rhythm.     Pulses: Normal pulses.     Heart sounds: Normal heart sounds.  Pulmonary:     Effort: Pulmonary effort is normal.     Breath sounds: Normal breath sounds.  Abdominal:     Palpations: Abdomen is soft. There is no hepatomegaly, splenomegaly or mass.     Tenderness: There is no abdominal tenderness.  Musculoskeletal:     Right lower leg: No edema.     Left lower leg: No edema.  Lymphadenopathy:     Cervical: No cervical adenopathy.     Right cervical: No superficial, deep or posterior cervical adenopathy.    Left cervical: No superficial, deep or posterior cervical adenopathy.     Upper Body:     Right upper body: No supraclavicular or axillary adenopathy.     Left upper body: No supraclavicular or axillary adenopathy.  Neurological:     General: No focal deficit present.     Mental Status: She is alert and oriented to person, place, and time.  Psychiatric:        Mood and Affect: Mood normal.        Behavior: Behavior normal.     LABS:   CBC     Component Value Date/Time   WBC 3.8 (L) 03/25/2023 0800   RBC 3.65 (L) 03/25/2023 0800   HGB 10.8 (L) 03/25/2023 0800   HGB 9.8 (L) 12/31/2022 1551   HCT 34.0 (L) 03/25/2023 0800   PLT 197 03/25/2023 0800   PLT 361 12/31/2022 1551   MCV 93.2 03/25/2023 0800   MCH 29.6 03/25/2023 0800   MCHC 31.8 03/25/2023 0800   RDW 15.6 (H) 03/25/2023 0800   LYMPHSABS 0.9 03/25/2023 0800   MONOABS 0.6 03/25/2023 0800   EOSABS 0.0 03/25/2023 0800   BASOSABS 0.0 03/25/2023 0800    CMP      Component Value Date/Time   NA 138 03/25/2023 0800   K 3.8 03/25/2023 0800   CL 107 03/25/2023 0800   CO2 23 03/25/2023 0800   GLUCOSE 99 03/25/2023 0800   BUN 10 03/25/2023 0800   CREATININE 0.53 03/25/2023 0800   CALCIUM 9.2 03/25/2023 0800   PROT 6.4 (L) 03/25/2023 0800   ALBUMIN 3.4 (L) 03/25/2023 0800   AST 15 03/25/2023 0800   ALT  10 03/25/2023 0800   ALKPHOS 58 03/25/2023 0800   BILITOT 0.5 03/25/2023 0800   GFRNONAA >60 03/25/2023 0800   GFRAA 94 06/04/2007 1522     Lab Results  Component Value Date   CEA1  1.0 08/26/2022   /  CEA  Date Value Ref Range Status  08/26/2022 1.0 0.0 - 4.7 ng/mL Final    Comment:    (NOTE)                             Nonsmokers          <3.9                             Smokers             <5.6 [employer] Electrochemiluminescence Immunoassay (ECLIA) Values obtained with different assay methods or kits cannot be used interchangeably.  Results cannot be interpreted as absolute evidence of the presence or absence of malignant disease. Performed At: [facility] [address] [place], [place] [id number] Jolene Schimke MD NW:[id number]    No results found for: "PSA1" No results found for: "VHQ469" Lab Results  Component Value Date   CAN125 1,131.0 (H) 03/04/2023    No results found for: "TOTALPROTELP", "ALBUMINELP", "A1GS", "A2GS", "BETS", "BETA2SER", "GAMS", "MSPIKE", "SPEI" Lab Results  Component Value Date   TIBC 369 10/24/2022   FERRITIN 22 10/24/2022   IRONPCTSAT 10 (L) 10/24/2022   No results found for: "LDH"   STUDIES:   CT CHEST ABDOMEN PELVIS W CONTRAST Result Date: 03/22/2023 CLINICAL DATA:  Ovarian cancer restaging, peritoneal carcinomatosis * Tracking Code: BO * EXAM: CT CHEST, ABDOMEN, AND PELVIS WITH CONTRAST TECHNIQUE: Multidetector CT imaging of the chest, abdomen and pelvis was performed following the standard protocol during bolus administration of intravenous contrast. RADIATION DOSE REDUCTION: This exam was performed according to the departmental dose-optimization program which includes automated exposure control, adjustment of the mA and/or kV according to patient size and/or use of iterative reconstruction technique. CONTRAST:  OMNIPAQUE IOHEXOL 300 MG/ML  SOLN COMPARISON:  CT abdomen pelvis, 01/01/2023, CT chest abdomen  pelvis, 11/07/2022 FINDINGS: CT CHEST FINDINGS Cardiovascular: Right chest port catheter. Scattered aortic atherosclerosis. Normal heart size. Left and right coronary artery calcifications. No pericardial effusion. Mediastinum/Nodes: No enlarged mediastinal, hilar, or axillary lymph nodes. Moderate hiatal hernia with intrathoracic position of the gastric fundus. Thyroid gland, trachea, and esophagus demonstrate no significant findings. Lungs/Pleura: Moderate, loculated appearing left pleural effusion with associated atelectasis or consolidation of the left lung base and posterior lingula, trace right pleural effusion, unchanged. Musculoskeletal: No chest wall abnormality. No acute osseous findings. CT ABDOMEN PELVIS FINDINGS Hepatobiliary: No solid liver abnormality is seen. Contracted gallbladder. No gallstones, gallbladder wall thickening, or biliary dilatation. Pancreas: Unremarkable. No pancreatic ductal dilatation or surrounding inflammatory changes. Spleen: Normal in size without significant abnormality. Adrenals/Urinary Tract: Adrenal glands are unremarkable. Kidneys are normal, without renal calculi, solid lesion, or hydronephrosis. Bladder is unremarkable. Stomach/Bowel: Stomach is within normal limits. Status post distal small bowel and sigmoid colon resection with rectal stump and left lower quadrant end colostomy. Vascular/Lymphatic: Aortic atherosclerosis. No enlarged abdominal or pelvic lymph nodes. Reproductive: Status post hysterectomy. Other: Status post midline laparotomy with left hemiabdomen end colostomy. No significant change in diffuse, residual peritoneal thickening throughout the abdomen and pelvis without discretely visible nodularity (series 2, image [id number]). Interval resolution of small locules of previously seen postoperative pneumoperitoneum. Musculoskeletal: No acute osseous findings. IMPRESSION: 1. Status post hysterectomy and midline laparotomy with left hemiabdomen end colostomy.  2. No significant change in diffuse, residual peritoneal thickening throughout the abdomen and pelvis without discretely visible nodularity.  3. Moderate, loculated appearing left pleural effusion with associated atelectasis or consolidation of the left lung base and posterior lingula, trace right pleural effusion, unchanged. 4. No evidence of lymphadenopathy or directly visualized metastatic disease in the chest. 5. Coronary artery disease. Aortic Atherosclerosis (ICD10-I70.0). Electronically Signed   By: Jearld Lesch M.D.   On: 03/22/2023 09:28   US THORACENTESIS ASP PLEURAL SPACE W/IMG GUIDE Result Date: 03/06/2023 INDICATION: 78 year old with ovarian cancer and shortness of breath. Recurrent left pleural effusion. EXAM: ULTRASOUND GUIDED LEFT THORACENTESIS MEDICATIONS: None. COMPLICATIONS: None immediate. PROCEDURE: An ultrasound guided thoracentesis was thoroughly discussed with the patient and questions answered. The benefits, risks, alternatives and complications were also discussed. The patient understands and wishes to proceed with the procedure. Written consent was obtained. Ultrasound was performed to localize and mark an adequate pocket of fluid in the left chest. The area was then prepped and draped in the normal sterile fashion. 1% Lidocaine was used for local anesthesia. Under ultrasound guidance a 6 Fr Safe-T-Centesis catheter was introduced. Thoracentesis was performed. The catheter was removed and a dressing applied. FINDINGS: A total of approximately 2 L of amber color fluid was removed. IMPRESSION: Successful ultrasound guided left thoracentesis yielding 2 L of pleural fluid. Electronically Signed   By: Richarda Overlie M.D.   On: 03/06/2023 12:38   DG Chest 1 View Result Date: 03/06/2023 CLINICAL DATA:  Status post left thoracentesis. EXAM: CHEST  1 VIEW COMPARISON:  03/04/2023 FINDINGS: Markedly improved aeration in the left lung compatible with recent thoracentesis. Small amount of residual left  pleural fluid. Again noted is mild blunting at the right costophrenic angle and there could be trace right pleural fluid. Right jugular Port-A-Cath is stable with the tip near the superior cavoatrial junction. Heart size is grossly stable. Negative for a pneumothorax. IMPRESSION: 1. Markedly improved aeration in the left lung compatible with recent thoracentesis. Negative for a pneumothorax. 2. Small amount of residual left pleural fluid. Electronically Signed   By: Richarda Overlie M.D.   On: 03/06/2023 12:36   DG Chest 2 View Result Date: 03/05/2023 CLINICAL DATA:  78 year old female with shortness of breath for 2 weeks, ovarian cancer undergoing chemotherapy. EXAM: CHEST - 2 VIEW COMPARISON:  Chest radiographs 12/10/2022 and earlier. CT Abdomen and Pelvis 01/01/2023. FINDINGS: PA and lateral views on 03/04/2023. Progressed and now large left pleural effusion. Dense opacification of the left lung base. Visible mediastinal contours are stable. Smaller layering right pleural effusion appears increased since November. Stable right chest Port-A-Cath. Visualized tracheal air column is within normal limits. Paucity of bowel gas. Stable visualized osseous structures. IMPRESSION: Progressed and large left pleural effusion. Small layering right pleural effusion. Electronically Signed   By: Odessa Fleming M.D.   On: 03/05/2023 09:09

## 2023-03-25 NOTE — Progress Notes (Signed)
 Nutrition Follow-up:  Pt with peritoneal carcinomatosis. S/p neoadjuvant chemotherapy followed by debulking surgery 11/13 under the care of Dr. Pricilla Holm. Patient is currently receiving carbo/taxol q21d.   Recurrent left pleural effusion - last thoracentesis on 2/5 yielded 2L  Met with patient in infusion. She reports new granulomas on stoma. Patient has appointment with wound care on Friday. Appetite is about the same. Patient eating 2 smaller meals and drinking 2 Ensure Complete. Patient reports constipation from chemotherapy. She is taking miralax. She denies nausea, vomiting.    Medications: reviewed  Labs: albumin 3.4  Anthropometrics: Wt 159 lb 6.3 oz today   2/3 - 161 lb 13.1 oz 1/13 - 159 lb 8 oz  12/19 - 160 lb 3.2 oz  11/25 - 159 lb 3.2 oz    NUTRITION DIAGNOSIS: Unintended wt loss stable   INTERVENTION:  Continue 2 Ensure Complete for added calories and protein - samples + coupons Continue bowel regimen for constipation    MONITORING, EVALUATION, GOAL: wt trends, intake   NEXT VISIT: To be scheduled in collaboration with upcoming [facility] treatment

## 2023-03-25 NOTE — Progress Notes (Signed)
 Patient has been examined by Dr. Ellin Saba. Vital signs and labs have been reviewed by MD - ANC, Creatinine, LFTs, hemoglobin, and platelets are within treatment parameters per M.D. - pt may proceed with treatment.  Primary RN and pharmacy notified.

## 2023-03-26 ENCOUNTER — Other Ambulatory Visit (HOSPITAL_COMMUNITY): Payer: Self-pay

## 2023-03-26 ENCOUNTER — Encounter: Payer: Self-pay | Admitting: Hematology

## 2023-03-26 DIAGNOSIS — D2372 Other benign neoplasm of skin of left lower limb, including hip: Secondary | ICD-10-CM | POA: Diagnosis not present

## 2023-03-26 DIAGNOSIS — L814 Other melanin hyperpigmentation: Secondary | ICD-10-CM | POA: Diagnosis not present

## 2023-03-26 DIAGNOSIS — D2272 Melanocytic nevi of left lower limb, including hip: Secondary | ICD-10-CM | POA: Diagnosis not present

## 2023-03-26 DIAGNOSIS — D0439 Carcinoma in situ of skin of other parts of face: Secondary | ICD-10-CM | POA: Diagnosis not present

## 2023-03-26 DIAGNOSIS — Z85828 Personal history of other malignant neoplasm of skin: Secondary | ICD-10-CM | POA: Diagnosis not present

## 2023-03-26 DIAGNOSIS — D485 Neoplasm of uncertain behavior of skin: Secondary | ICD-10-CM | POA: Diagnosis not present

## 2023-03-26 DIAGNOSIS — L720 Epidermal cyst: Secondary | ICD-10-CM | POA: Diagnosis not present

## 2023-03-26 DIAGNOSIS — Z08 Encounter for follow-up examination after completed treatment for malignant neoplasm: Secondary | ICD-10-CM | POA: Diagnosis not present

## 2023-03-26 DIAGNOSIS — L821 Other seborrheic keratosis: Secondary | ICD-10-CM | POA: Diagnosis not present

## 2023-03-26 LAB — CA 125: Cancer Antigen (CA) 125: 714 U/mL — ABNORMAL HIGH (ref 0.0–38.1)

## 2023-03-26 NOTE — Telephone Encounter (Signed)
 Oral Oncology Patient Advocate Encounter   Was successful in securing patient an $67 grant from [employer] [facility]) to provide copayment coverage for Zejula.  This will keep the out of pocket expense at $0.     I have spoken with the patient.    The billing information is as follows and has been shared with [facility].   Member ID: [id number] Group ID: [id number] RxBin: [id number] Dates of Eligibility: 12/25/2022 through 03/23/2024  Fund:  Ovarian Cancer  Patty Almedia Balls, CPhT Oncology Pharmacy Patient Advocate [facility] Direct Number: [phone] Fax: [phone]

## 2023-03-27 ENCOUNTER — Inpatient Hospital Stay: Payer: Medicare Other | Admitting: Licensed Clinical Social Worker

## 2023-03-27 DIAGNOSIS — C786 Secondary malignant neoplasm of retroperitoneum and peritoneum: Secondary | ICD-10-CM

## 2023-03-27 NOTE — Progress Notes (Signed)
 [facility] CSW Progress Note  Visual merchandiser  received an Agricultural engineer from [employer] stating they had attempted to contact pt by phone to do an intake and had been unable to reach her w/ a request to reschedule an intake.  CSW contacted pt by phone to discuss.  Pt declined rescheduling.  CSW to remain available as appropriate throughout duration of treatment to provide support.        Rachel Moulds, LCSW Clinical Social Worker [facility]

## 2023-03-28 ENCOUNTER — Ambulatory Visit (HOSPITAL_COMMUNITY)
Admission: RE | Admit: 2023-03-28 | Discharge: 2023-03-28 | Disposition: A | Discharge status: Home / Self Care | Payer: Medicare Other | Source: Ambulatory Visit | Attending: Hematology | Admitting: Hematology

## 2023-03-28 ENCOUNTER — Other Ambulatory Visit: Payer: Self-pay | Admitting: *Deleted

## 2023-03-28 ENCOUNTER — Telehealth: Payer: Self-pay | Admitting: *Deleted

## 2023-03-28 DIAGNOSIS — J90 Pleural effusion, not elsewhere classified: Secondary | ICD-10-CM | POA: Diagnosis not present

## 2023-03-28 NOTE — Telephone Encounter (Signed)
 Per DG Chest patient has moderate left effusion.  Per Dr. Ellin Saba, patient scheduled for thoracentesis in am.  Patient notified to arrive in radiology at [facility] at 8:45 tomorrow.  Unfortunately, she does have 2 appointments in [place] that she will need to reschedule.  There were no open appointments in [place] for tomorrow for a thora.  Patient given the number to Central Scheduling in the event she needs to reschedule.  She is aware that if she is unable to go tomorrow she will need to report to the ER should her symptoms worsen.  Currently, she is very short of breath as a result.

## 2023-03-28 NOTE — Progress Notes (Signed)
 Patient called and states that she feels like her lungs are filling up, as she is beginning to experience SOB.  Stat CXR placed and will consult with Dr. Ellin Saba on recommendations.

## 2023-03-29 ENCOUNTER — Ambulatory Visit (HOSPITAL_COMMUNITY)
Admission: RE | Admit: 2023-03-29 | Discharge: 2023-03-29 | Disposition: A | Discharge status: Home / Self Care | Payer: Medicare Other | Source: Ambulatory Visit | Attending: *Deleted | Admitting: *Deleted

## 2023-03-29 ENCOUNTER — Encounter (INDEPENDENT_AMBULATORY_CARE_PROVIDER_SITE_OTHER): Payer: Medicare Other | Admitting: Ophthalmology

## 2023-03-29 ENCOUNTER — Encounter (HOSPITAL_COMMUNITY): Payer: Self-pay

## 2023-03-29 ENCOUNTER — Ambulatory Visit (HOSPITAL_COMMUNITY)
Admission: RE | Admit: 2023-03-29 | Discharge: 2023-03-29 | Disposition: A | Discharge status: Home / Self Care | Payer: Medicare Other | Source: Ambulatory Visit | Attending: Student | Admitting: Student

## 2023-03-29 ENCOUNTER — Ambulatory Visit (HOSPITAL_COMMUNITY)
Admission: RE | Admit: 2023-03-29 | Discharge: 2023-03-29 | Disposition: A | Discharge status: Home / Self Care | Payer: Medicare Other | Source: Ambulatory Visit | Attending: Hematology | Admitting: Hematology

## 2023-03-29 DIAGNOSIS — K9400 Colostomy complication, unspecified: Secondary | ICD-10-CM | POA: Diagnosis not present

## 2023-03-29 DIAGNOSIS — Z452 Encounter for adjustment and management of vascular access device: Secondary | ICD-10-CM | POA: Diagnosis not present

## 2023-03-29 DIAGNOSIS — Z860100 Personal history of colon polyps, unspecified: Secondary | ICD-10-CM | POA: Diagnosis not present

## 2023-03-29 DIAGNOSIS — J90 Pleural effusion, not elsewhere classified: Secondary | ICD-10-CM | POA: Diagnosis not present

## 2023-03-29 DIAGNOSIS — Z48813 Encounter for surgical aftercare following surgery on the respiratory system: Secondary | ICD-10-CM | POA: Diagnosis not present

## 2023-03-29 DIAGNOSIS — K449 Diaphragmatic hernia without obstruction or gangrene: Secondary | ICD-10-CM | POA: Diagnosis not present

## 2023-03-29 DIAGNOSIS — C786 Secondary malignant neoplasm of retroperitoneum and peritoneum: Secondary | ICD-10-CM | POA: Insufficient documentation

## 2023-03-29 MED ORDER — LIDOCAINE HCL (PF) 2 % IJ SOLN
10.0000 mL | Freq: Once | INTRAMUSCULAR | Status: AC
  Administered 2023-03-29: 10 mL

## 2023-03-29 MED ORDER — LIDOCAINE HCL (PF) 2 % IJ SOLN
INTRAMUSCULAR | Status: AC
  Filled 2023-03-29: qty 20

## 2023-03-29 NOTE — Discharge Instructions (Signed)
 Please excuse from work. Son brings patient in to be seen.

## 2023-03-29 NOTE — Progress Notes (Signed)
 Patient tolerated Left sided thoracentesis procedure well today and 1.2 Liters of pleural fluid removed. Patient verbalized understanding of post procedure instructions and ambulatory to xray department for post chest xray with no acute distress noted.

## 2023-03-29 NOTE — Progress Notes (Signed)
 [facility]   Reason for visit:  LUQ colostomy HPI:  Peritoneal carcinoma with resection and colostomy 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)    Esophageal stricture    Family history of breast cancer    Family history of colon cancer    Family history of melanoma    Family history of prostate cancer    Fibroids 1998   uterine, history of (left ovaries)   GERD (gastroesophageal reflux disease)    History of hiatal hernia    History of uterine prolapse    Hx of adenomatous polyp of colon 10/19/2014   Hyperlipidemia    Hypertension    borderline no meds diet and exercise   Neuromuscular disorder (HCC)    neuropathy from chemo   Obesity    Ovarian cancer (HCC)    PVC (premature ventricular contraction)    first dx by holter in 1980's, echo (6/10) EF 55-60%, normal diastolic fxn, normal size RV and fxn, mild MR, PASP   Squamous cell carcinoma of arm    ovarian   Tubal pregnancy    Ulcerative proctosigmoiditis (HCC)    Urge urinary incontinence 06/03/2007   Qualifier: Diagnosis of  By: Lindwood Qua CMA, Jerl Santos     Family History  Problem Relation Age of Onset   Heart attack Mother 41   Transient ischemic attack Mother    Diabetes type II Mother    Sudden death Mother        died age 40   Diabetes Mother    Coronary artery disease Mother    Skin cancer Mother    Barrett's esophagus Mother    Sudden death Father 2       "coronary arteriosclerosis" on death certificate   Breast cancer Sister        dx in her 67s   Colon polyps Sister        adenomatous   Arrhythmia Brother    Colon cancer Brother 39   Congestive Heart Failure Maternal Grandmother    Prostate cancer Maternal Grandfather        dx in 27s   Tuberculosis Paternal Grandfather    Ehlers-Danlos syndrome Daughter    Colon cancer Son 52       lynch  syndrome   Other Son        parotid gland tumor   Skin cancer Maternal Aunt    Barrett's esophagus Maternal Aunt    Melanoma Maternal Uncle    Colon cancer Cousin        mid 19s; maternal cousin   Colon cancer Other        MGMs brother   Esophageal cancer Neg Hx    Stomach cancer Neg Hx    Rectal cancer Neg Hx    Uterine cancer Neg Hx    Bladder Cancer Neg Hx    Allergies  Allergen Reactions   Aspartame     Intolerance to artifical sweeteners, pt states caused burning of bladder   Levaquin [Levofloxacin] Nausea Only   Pantoprazole Sodium Diarrhea    Abdominal pain   Ranitidine Diarrhea    abdominal pain   Omeprazole Diarrhea    Abdominal pain  Other Reaction(s): Not available   Sulfasalazine Rash and Other (See Comments)    Fever, chills, headache, muscle pain also   Current Outpatient  Medications  Medication Sig Dispense Refill Last Dose/Taking   apixaban (ELIQUIS) 5 MG TABS tablet Take 1 tablet (5 mg total) by mouth 2 (two) times daily. 180 tablet 0    Ascorbic Acid (VITAMIN C) 1000 MG tablet Take 1,000 mg by mouth daily.      calcium citrate-vitamin D (CITRACAL+D) 315-200 MG-UNIT per tablet Take 1 tablet by mouth 2 (two) times daily.      CARBOPLATIN IV Inject into the vein every 21 ( twenty-one) days. port      Cholecalciferol (VITAMIN D-3) 5000 UNITS TABS Take 5,000 Units by mouth daily.      ciprofloxacin (CIPRO) 500 MG tablet Take 1 tablet (500 mg total) by mouth 2 (two) times daily. 20 tablet 0    Coenzyme Q10 (CO Q-10) 200 MG CAPS Take 200 mg by mouth daily.      CRANBERRY PO Take 650 mg by mouth daily.      Glucosamine-Chondroitin (GLUCOSAMINE CHONDR COMPLEX PO) Take 2 capsules by mouth daily.      GRAPE SEED EXTRACT PO Take 1 capsule by mouth daily.      ketoconazole (NIZORAL) 2 % cream Apply 1 Application topically daily. To yeast rash 30 g 1    lansoprazole (PREVACID) 15 MG capsule Take 1 capsule (15 mg total) by mouth 2 (two) times daily before a meal.       lidocaine-prilocaine (EMLA) cream Apply to affected area once (Patient taking differently: Apply 1 Application topically daily as needed (prior to port access).) 30 g 3    milk thistle 175 MG tablet Take 175 mg by mouth daily.      mirtazapine (REMERON) 15 MG tablet Take 1 tablet (15 mg total) by mouth at bedtime. 30 tablet 4    niraparib tosylate (ZEJULA) 200 MG tablet Take 1 tablet (200 mg total) by mouth daily. May take at bedtime to reduce nausea and vomiting. 30 tablet 1    Omega-3 Fatty Acids (FISH OIL TRIPLE STRENGTH PO) Take 1 capsule by mouth daily.      ondansetron (ZOFRAN) 8 MG tablet Take 1 tablet (8 mg total) by mouth every 8 (eight) hours as needed for nausea or vomiting. Start on the third day after carboplatin. 30 tablet 1    PACLITAXEL IV Inject into the vein every 21 ( twenty-one) days. port      phenazopyridine (PYRIDIUM) 200 MG tablet Take 1 tablet (200 mg total) by mouth 3 (three) times daily as needed for pain. 10 tablet 0    Polyethyl Glycol-Propyl Glycol (SYSTANE OP) Place 1 drop into both eyes 4 (four) times daily as needed (for dryness).      PREVACID 24HR 15 MG capsule Take 15 mg by mouth See admin instructions. Take 15 mg by mouth in the morning before any food and an additional 15 mg once a day as needed for unresolved reflux      prochlorperazine (COMPAZINE) 10 MG tablet Take 10 mg by mouth every 6 (six) hours as needed for nausea or vomiting.      rosuvastatin (CRESTOR) 20 MG tablet Take 1 tablet (20 mg total) by mouth at bedtime. 90 tablet 3    simethicone (MYLICON) 80 MG chewable tablet Chew 1 tablet (80 mg total) by mouth 4 (four) times daily as needed for flatulence. 30 tablet 4    temazepam (RESTORIL) 30 MG capsule Take 1 capsule (30 mg total) by mouth at bedtime as needed for sleep. 30 capsule 0    traMADol (ULTRAM) 50  MG tablet Take 1 tablet (50 mg total) by mouth every 6 (six) hours as needed for severe pain (pain score 7-10). For AFTER surgery only, do not take  and drive 15 tablet 0    traZODone (DESYREL) 50 MG tablet Take 1 tablet (50 mg total) by mouth at bedtime as needed for sleep. 30 tablet 1    vitamin B-12 (CYANOCOBALAMIN) 1000 MCG tablet Take 1,000 mcg by mouth daily.      zinc gluconate 50 MG tablet Take 50 mg by mouth daily.      No current facility-administered medications for this encounter.   ROS  Review of Systems  Constitutional:  Positive for fatigue.  HENT:  Positive for mouth sores.   Gastrointestinal:        LMQ colostomy  Musculoskeletal:  Positive for gait problem.       Weakness  uses wheelchair in hospital  Hematological:  Bruises/bleeds easily.  Psychiatric/Behavioral: Negative.    All other systems reviewed and are negative.  Vital signs:  BP (!) 143/66 (BP Location: Right Arm)   Pulse 99   Temp 97.9 F (36.6 C) (Oral)   Resp 18   LMP 05/29/1996   SpO2 98%  Exam:  Physical Exam Vitals reviewed.  Constitutional:      Appearance: She is ill-appearing.  HENT:     Mouth/Throat:     Mouth: Mucous membranes are moist.  Cardiovascular:     Rate and Rhythm: Normal rate and regular rhythm.     Pulses: Normal pulses.  Pulmonary:     Breath sounds: Normal breath sounds.  Abdominal:     Palpations: Abdomen is soft.  Skin:    General: Skin is warm and dry.  Neurological:     Mental Status: She is oriented to person, place, and time. Mental status is at baseline.  Psychiatric:        Mood and Affect: Mood normal.        Behavior: Behavior normal.     Stoma type/location:  LMQ colostomy Stomal assessment/size:  1 3/8" pink and moist with granuloma to mucocutaneous junction and a large granuloma at stomal os.  Peristomal assessment:  intact Treatment options for stomal/peristomal skin: granulomas bleed occasionally but are not troublesome at this time.  Informed patient that if granuloma on stomal opening occludes stool drainage, we would try silver nitrate.  She has used silver nitrate in the past and it was  not comfortable  Output: soft brown stool Ostomy pouching: 2pc.  Education provided:  management of granulomas.  She has multiple questions regarding lesions to stoma  I encourage her to show her oncologist at next infusion. She has a history of colon polyps as well.     Impression/dx  Lesions to stoma Colostomy  Discussion  No change in care at this time. Will cut barrier opening larger to avoid trauma to stoma . Plan  See back as needed.  Will treat granulomas with silver nitrate if bleeding or painful.     Visit time: 45 minutes.   Mike Gip FNP-BC

## 2023-03-29 NOTE — Procedures (Signed)
 PROCEDURE SUMMARY:  Successful US guided left thoracentesis. Yielded 1.2 L of amber-colored fluid. Pt tolerated procedure well. No immediate complications.  Specimen not sent for labs. CXR ordered; no post-procedure pneumothorax identified.   EBL < 2 mL  Mickie Kay, NP 03/29/2023 9:58 AM

## 2023-04-01 ENCOUNTER — Other Ambulatory Visit: Payer: Self-pay

## 2023-04-01 ENCOUNTER — Other Ambulatory Visit (HOSPITAL_COMMUNITY): Payer: Self-pay

## 2023-04-01 ENCOUNTER — Other Ambulatory Visit: Payer: Self-pay | Admitting: Pharmacy Technician

## 2023-04-01 NOTE — Progress Notes (Signed)
 Specialty Pharmacy Initial Fill Coordination Note  Rachel Carlson is a 78 y.o. female contacted today regarding refills of specialty medication(s) Niraparib Tosylate (ZEJULA) .  Patient requested Delivery  on 04/04/23  to verified address [address] [place] [address]   Medication will be filled on 04/03/2023.   Patient is aware of $0 copayment.    Patty Almedia Balls, CPhT Oncology Pharmacy Patient Advocate [facility] Direct Number: [phone] Fax: [phone]

## 2023-04-01 NOTE — Telephone Encounter (Signed)
 Clinical Pharmacist Practitioner Encounter   [facility] (Specialty) will deliver medication on 04/03/33. She knows not to start until after seeing MD on 04/22/23.  Patient Education I spoke with patient for overview of new oral chemotherapy medication: Zejula (niraparib) for maintenance treatment of ovarian cancer following platinum based chemotherapy, planned duration until disease progression or unacceptable drug toxicity. Planned start date 04/22/23 on day of return visit with Dr. Ellin Saba.    Counseled patient on administration, dosing, side effects, monitoring, drug-food interactions, safe handling, storage, and disposal. Patient will take 1 tablet (200 mg total) by mouth daily. May take at bedtime to reduce nausea and vomiting.   Side effects include but not limited to: decreased wbc/hgb/plt, nausea, fatigue, constipation.   Nausea: patient has ondansetron on hand to use as needed  Reviewed with patient importance of keeping a medication schedule and plan for any missed doses.  After discussion with patient no patient barriers to medication adherence identified.   Rachel Carlson voiced understanding and appreciation. All questions answered. Medication handout provided.  Provided patient with Oral Chemotherapy Navigation Clinic phone number. Patient knows to call the office with questions or concerns. Oral Chemotherapy Navigation Clinic will continue to follow.  [name], PharmD, BCOP, CPP Hematology/Oncology Clinical Pharmacist [facility] Oral Chemotherapy Navigation Clinic [phone]  04/01/2023 3:42 PM

## 2023-04-01 NOTE — Progress Notes (Signed)
 Patient education documented in EPIC note on 04/01/23.

## 2023-04-03 DIAGNOSIS — Z860100 Personal history of colon polyps, unspecified: Secondary | ICD-10-CM | POA: Insufficient documentation

## 2023-04-03 DIAGNOSIS — K9400 Colostomy complication, unspecified: Secondary | ICD-10-CM | POA: Insufficient documentation

## 2023-04-04 ENCOUNTER — Other Ambulatory Visit: Payer: Self-pay

## 2023-04-04 ENCOUNTER — Telehealth: Payer: Self-pay

## 2023-04-04 DIAGNOSIS — C786 Secondary malignant neoplasm of retroperitoneum and peritoneum: Secondary | ICD-10-CM

## 2023-04-04 DIAGNOSIS — J90 Pleural effusion, not elsewhere classified: Secondary | ICD-10-CM

## 2023-04-04 NOTE — Progress Notes (Signed)
 Standing order placed for [place] thora PRN per Dr. Ellin Saba verbal order

## 2023-04-05 ENCOUNTER — Encounter: Payer: Self-pay | Admitting: Hematology

## 2023-04-05 NOTE — Telephone Encounter (Signed)
 Patient called to ask questions on having a procedure done while on oral chemo, and  could she get a standing order for a thoracentesis.  Patient was called back by nurse and informed her it was ok tor the procedure and the standing order was put in when needed.

## 2023-04-12 ENCOUNTER — Ambulatory Visit (HOSPITAL_COMMUNITY): Payer: Medicare Other | Admitting: Nurse Practitioner

## 2023-04-12 ENCOUNTER — Other Ambulatory Visit: Payer: Self-pay | Admitting: Family Medicine

## 2023-04-15 ENCOUNTER — Ambulatory Visit (HOSPITAL_COMMUNITY)
Admission: RE | Admit: 2023-04-15 | Discharge: 2023-04-15 | Disposition: A | Discharge status: Home / Self Care | Source: Ambulatory Visit | Attending: *Deleted | Admitting: *Deleted

## 2023-04-15 DIAGNOSIS — K9400 Colostomy complication, unspecified: Secondary | ICD-10-CM

## 2023-04-15 DIAGNOSIS — Z79899 Other long term (current) drug therapy: Secondary | ICD-10-CM | POA: Insufficient documentation

## 2023-04-15 DIAGNOSIS — K9409 Other complications of colostomy: Secondary | ICD-10-CM | POA: Diagnosis not present

## 2023-04-15 DIAGNOSIS — R229 Localized swelling, mass and lump, unspecified: Secondary | ICD-10-CM | POA: Insufficient documentation

## 2023-04-15 NOTE — Progress Notes (Signed)
 [facility]   Reason for visit:  LUQ colostomy HPI:  Ulcerative proctosigmoiditis, peritoneal carcinoma Past Medical History:  Diagnosis Date  . Blood transfusion without reported diagnosis   . Chronic right ear pain    normal MRI  . DVT of leg (deep venous thrombosis) (HCC)   . Esophageal stricture   . Family history of breast cancer   . Family history of colon cancer   . Family history of melanoma   . Family history of prostate cancer   . Fibroids 1998   uterine, history of (left ovaries)  . GERD (gastroesophageal reflux disease)   . History of hiatal hernia   . History of uterine prolapse   . Hx of adenomatous polyp of colon 10/19/2014  . Hyperlipidemia   . Hypertension    borderline no meds diet and exercise  . 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 Obesity   . Ovarian cancer (HCC)   . PVC (premature ventricular contraction)    first dx by holter in 1980's, echo (6/10) EF 55-60%, normal diastolic fxn, normal size RV and fxn, mild MR, PASP  . Squamous cell carcinoma of arm    ovarian  . Tubal pregnancy   . Ulcerative proctosigmoiditis (HCC)   . Urge urinary incontinence 06/03/2007   Qualifier: Diagnosis of  By: Lindwood Qua CMA, Jerl Santos     Family History  Problem Relation Age of Onset  . Heart attack Mother 88  . Transient ischemic attack Mother   . Diabetes type II Mother   . Sudden death Mother        died age 55  . Diabetes Mother   . Coronary artery disease Mother   . Skin cancer Mother   . Barrett's esophagus Mother   . Sudden death Father 36       "coronary arteriosclerosis" on death certificate  . Breast cancer Sister        dx in her 9s  . Colon polyps Sister        adenomatous  . Arrhythmia Brother   . Colon cancer Brother 31  . Congestive Heart Failure Maternal Grandmother   . Prostate cancer Maternal Grandfather        dx in 66s  . Tuberculosis Paternal Grandfather   . Ehlers-Danlos syndrome Daughter    . Colon cancer Son 44       lynch syndrome  . Other Son        parotid gland tumor  . Skin cancer Maternal Aunt   . Barrett's esophagus Maternal Aunt   . Melanoma Maternal Uncle   . Colon cancer Cousin        mid 29s; maternal cousin  . Colon cancer Other        MGMs brother  . Esophageal cancer Neg Hx   . Stomach cancer Neg Hx   . Rectal cancer Neg Hx   . Uterine cancer Neg Hx   . Bladder Cancer Neg Hx    Allergies  Allergen Reactions  . Aspartame     Intolerance to artifical sweeteners, pt states caused burning of bladder  . Levaquin [Levofloxacin] Nausea Only  . Pantoprazole Sodium Diarrhea    Abdominal pain  . Ranitidine Diarrhea    abdominal pain  . Omeprazole Diarrhea    Abdominal pain  Other Reaction(s): Not available  . Sulfasalazine Rash and Other (See Comments)    Fever, chills, headache, muscle pain also   Current Outpatient Medications  Medication Sig Dispense Refill Last Dose/Taking  . apixaban (ELIQUIS) 5 MG TABS tablet Take 1 tablet by mouth twice daily 180 tablet 0   . Ascorbic Acid (VITAMIN C) 1000 MG tablet Take 1,000 mg by mouth daily.     . calcium citrate-vitamin D (CITRACAL+D) 315-200 MG-UNIT per tablet Take 1 tablet by mouth 2 (two) times daily.     . Cholecalciferol (VITAMIN D-3) 5000 UNITS TABS Take 5,000 Units by mouth daily.     . ciprofloxacin (CIPRO) 500 MG tablet Take 1 tablet (500 mg total) by mouth 2 (two) times daily. 20 tablet 0   . Coenzyme Q10 (CO Q-10) 200 MG CAPS Take 200 mg by mouth daily.     Marland Kitchen CRANBERRY PO Take 650 mg by mouth daily.     . Glucosamine-Chondroitin (GLUCOSAMINE CHONDR COMPLEX PO) Take 2 capsules by mouth daily.     Marland Kitchen GRAPE SEED EXTRACT PO Take 1 capsule by mouth daily.     Marland Kitchen ketoconazole (NIZORAL) 2 % cream Apply 1 Application topically daily. To yeast rash 30 g 1   . lansoprazole (PREVACID) 15 MG capsule Take 1 capsule (15 mg total) by mouth 2 (two) times daily before a meal.     . milk thistle 175 MG tablet Take  175 mg by mouth daily.     . mirtazapine (REMERON) 15 MG tablet Take 1 tablet (15 mg total) by mouth at bedtime. 30 tablet 4   . niraparib tosylate (ZEJULA) 200 MG tablet Take 1 tablet (200 mg total) by mouth daily. May take at bedtime to reduce nausea and vomiting. 30 tablet 1   . Omega-3 Fatty Acids (FISH OIL TRIPLE STRENGTH PO) Take 1 capsule by mouth daily.     . phenazopyridine (PYRIDIUM) 200 MG tablet Take 1 tablet (200 mg total) by mouth 3 (three) times daily as needed for pain. 10 tablet 0   . Polyethyl Glycol-Propyl Glycol (SYSTANE OP) Place 1 drop into both eyes 4 (four) times daily as needed (for dryness).     Marland Kitchen PREVACID 24HR 15 MG capsule Take 15 mg by mouth See admin instructions. Take 15 mg by mouth in the morning before any food and an additional 15 mg once a day as needed for unresolved reflux     . prochlorperazine (COMPAZINE) 10 MG tablet Take 10 mg by mouth every 6 (six) hours as needed for nausea or vomiting.     . rosuvastatin (CRESTOR) 20 MG tablet Take 1 tablet (20 mg total) by mouth at bedtime. 90 tablet 3   . simethicone (MYLICON) 80 MG chewable tablet Chew 1 tablet (80 mg total) by mouth 4 (four) times daily as needed for flatulence. 30 tablet 4   . temazepam (RESTORIL) 30 MG capsule Take 1 capsule (30 mg total) by mouth at bedtime as needed for sleep. 30 capsule 0   . traMADol (ULTRAM) 50 MG tablet Take 1 tablet (50 mg total) by mouth every 6 (six) hours as needed for severe pain (pain score 7-10). For AFTER surgery only, do not take and drive 15 tablet 0   . traZODone (DESYREL) 50 MG tablet Take 1 tablet (50 mg total) by mouth at bedtime as needed for sleep. 30 tablet 1   . vitamin B-12 (CYANOCOBALAMIN) 1000 MCG tablet Take 1,000 mcg by mouth daily.     Marland Kitchen zinc gluconate 50 MG tablet Take 50 mg by mouth daily.      No current facility-administered medications for this encounter.   ROS  Review of Systems  Constitutional:  Positive for fatigue.  Gastrointestinal:         LUQ colostomy History ulcerative colitis New lesions to stoma, atypical   Neurological:  Positive for numbness (hands).  Psychiatric/Behavioral: Negative.    Vital signs:  LMP 05/29/1996  Exam:  Physical Exam Vitals reviewed.  Constitutional:      Appearance: Normal appearance.  Cardiovascular:     Rate and Rhythm: Normal rate and regular rhythm.  Pulmonary:     Breath sounds: Normal breath sounds.  Abdominal:     Palpations: Abdomen is soft.  Skin:    General: Skin is warm.  Neurological:     Mental Status: She is oriented to person, place, and time. Mental status is at baseline.    Stoma type/location:  LUQ colostomy Stomal assessment/size:  1 3/8" pink and moist, increased nodules to stoma today, not in typical location of granulomas.  I have asked her to show this to the GI or oncology team.  Lesions may be pseudopolyps, due to ulcerative colitis.  She will follow up with GI or oncology.  Peristomal assessment:  intact  Treatment options for stomal/peristomal skin: Barrier ring 2 piece pouch Output: soft brown stool Ostomy pouching: 2pc.  Education provided:  Discussed new lesions to stoma and recommend specialist follow up  THey bleed at times, but are not causing an issue otherwise.     Impression/dx  Colostomy complication History ulcerative colitis Discussion  See back 2 weeks Plan  Follow up with GI regarding new lesions to stoma No change in pouching.     Visit time: 45 minutes.   Mike Gip FNP-BC

## 2023-04-15 NOTE — Telephone Encounter (Signed)
 Last filled on 12/26/22 #180 tab/ 0 refills, CPE scheduled 08/21/23

## 2023-04-15 NOTE — Discharge Instructions (Addendum)
 Prolapsed stoma Granulomas noted Follow up with GI to rule out lesions from ulcerative colitis Recommend wearing belt  item [phone]

## 2023-04-16 ENCOUNTER — Telehealth: Payer: Self-pay | Admitting: *Deleted

## 2023-04-16 NOTE — Telephone Encounter (Signed)
 Spoke with Ms. Pizzolato who called the office with concerns about the granulomas on her stoma and the prolapse. Pt states that 2 weeks ago the "whole stoma oozed blood"  and then stopped and then it happened again 5 days later and stopped. Pt states sometimes it has a burning sensation, no denies pain. Pt is having regular soft bowel movements. Pt states the stoma is getting taller and taller, the granulomas are increasing in number and the area around the opening is edematous with fluid underneath those areas.   Pt had a visit yesterday with Mike Gip at the [facility] and a picture was taken to compare from previous one taken on 03/19/23. Warner Mccreedy, NP viewed the photo and didn't see an urgent concern.   Pt has concerns and multiple questions and is requesting a call from Dr. Pricilla Holm. Advised patient her message would be relayed to provider and a phone visit will be scheduled. Pt verbalized understanding and thanked the office.

## 2023-04-17 ENCOUNTER — Encounter: Payer: Self-pay | Admitting: Hematology

## 2023-04-17 ENCOUNTER — Inpatient Hospital Stay: Attending: Hematology | Admitting: Gynecologic Oncology

## 2023-04-17 DIAGNOSIS — Z85828 Personal history of other malignant neoplasm of skin: Secondary | ICD-10-CM | POA: Insufficient documentation

## 2023-04-17 DIAGNOSIS — G4700 Insomnia, unspecified: Secondary | ICD-10-CM | POA: Insufficient documentation

## 2023-04-17 DIAGNOSIS — Z79899 Other long term (current) drug therapy: Secondary | ICD-10-CM | POA: Insufficient documentation

## 2023-04-17 DIAGNOSIS — Z807 Family history of other malignant neoplasms of lymphoid, hematopoietic and related tissues: Secondary | ICD-10-CM | POA: Insufficient documentation

## 2023-04-17 DIAGNOSIS — Z90722 Acquired absence of ovaries, bilateral: Secondary | ICD-10-CM | POA: Insufficient documentation

## 2023-04-17 DIAGNOSIS — Z800 Family history of malignant neoplasm of digestive organs: Secondary | ICD-10-CM | POA: Insufficient documentation

## 2023-04-17 DIAGNOSIS — C482 Malignant neoplasm of peritoneum, unspecified: Secondary | ICD-10-CM | POA: Diagnosis not present

## 2023-04-17 DIAGNOSIS — R002 Palpitations: Secondary | ICD-10-CM | POA: Insufficient documentation

## 2023-04-17 DIAGNOSIS — Z86718 Personal history of other venous thrombosis and embolism: Secondary | ICD-10-CM | POA: Insufficient documentation

## 2023-04-17 DIAGNOSIS — G629 Polyneuropathy, unspecified: Secondary | ICD-10-CM | POA: Insufficient documentation

## 2023-04-17 DIAGNOSIS — K5900 Constipation, unspecified: Secondary | ICD-10-CM | POA: Insufficient documentation

## 2023-04-17 DIAGNOSIS — Z90710 Acquired absence of both cervix and uterus: Secondary | ICD-10-CM | POA: Insufficient documentation

## 2023-04-17 DIAGNOSIS — Z7901 Long term (current) use of anticoagulants: Secondary | ICD-10-CM | POA: Insufficient documentation

## 2023-04-17 DIAGNOSIS — Z7189 Other specified counseling: Secondary | ICD-10-CM | POA: Diagnosis not present

## 2023-04-17 DIAGNOSIS — Z801 Family history of malignant neoplasm of trachea, bronchus and lung: Secondary | ICD-10-CM | POA: Insufficient documentation

## 2023-04-17 DIAGNOSIS — J90 Pleural effusion, not elsewhere classified: Secondary | ICD-10-CM | POA: Insufficient documentation

## 2023-04-17 NOTE — Progress Notes (Unsigned)
 Gynecologic Oncology Telehealth Note: Gyn-Onc  I connected with [name] on 04/17/23 at  5:30 PM EDT by telephone and verified that I am speaking with the correct person using two identifiers.  I discussed the limitations, risks, security and privacy concerns of performing an evaluation and management service by telemedicine and the availability of in-person appointments. I also discussed with the patient that there may be a patient responsible charge related to this service. The patient expressed understanding and agreed to proceed.  Other persons participating in the visit and their role in the encounter: none.  Patient's location: home Provider's location: [facility]  Reason for Visit: follow-up  Treatment History: Oncology History Overview Note  CARIS: FOLR1 2+ (75%) ER(2+) HRD-, PDL1 CPS 5%, p53 mutation No mutation in BRCA1/2, ATM, ERBB2, KRAS PR negative   Carcinomatosis (HCC)  08/31/2022 Initial Diagnosis   Carcinomatosis (HCC)   09/12/2022 -  Chemotherapy   Patient is on Treatment Plan : OVARIAN Carboplatin (AUC 6) + Paclitaxel (175) q21d X 6 Cycles     Gynecologic malignancy (HCC)  12/12/2022 Initial Diagnosis   Gynecologic malignancy (HCC)   02/19/2023 Genetic Testing   Negative genetic testing on the Multi-cancer + RNA panel.  The report date is February 19, 2023.  The Multi-Cancer + RNA Panel offered by [facility] includes sequencing and/or deletion/duplication analysis of the following 70 genes:  AIP*, ALK, APC*, ATM*, AXIN2*, BAP1*, BARD1*, BLM*, BMPR1A*, BRCA1*, BRCA2*, BRIP1*, CDC73*, CDH1*, CDK4, CDKN1B*, CDKN2A, CHEK2*, CTNNA1*, DICER1*, EPCAM (del/dup only), EGFR, FH*, FLCN*, GREM1 (promoter dup only), HOXB13, KIT, LZTR1, MAX*, MBD4, MEN1*, MET, MITF, MLH1*, MSH2*, MSH3*, MSH6*, MUTYH*, NF1*, NF2*, NTHL1*, PALB2*, PDGFRA, PMS2*, POLD1*, POLE*, POT1*, PRKAR1A*, PTCH1*, PTEN*, RAD51C*, RAD51D*, RB1*, RET, SDHA* (sequencing only), SDHAF2*, SDHB*, SDHC*, SDHD*, SMAD4*,  SMARCA4*, SMARCB1*, SMARCE1*, STK11*, SUFU*, TMEM127*, TP53*, TSC1*, TSC2*, VHL*. RNA analysis is performed for * genes.   Negative genetic testing on the common hereditary cancer panel.  The Hereditary Gene Panel offered by [facility] includes sequencing and/or deletion duplication testing of the following 47 genes: APC, ATM, AXIN2, BARD1, BMPR1A, BRCA1, BRCA2, BRIP1, CDH1, CDK4, CDKN2A (p14ARF), CDKN2A (p16INK4a), CHEK2, CTNNA1, DICER1, EPCAM (Deletion/duplication testing only), GREM1 (promoter region deletion/duplication testing only), KIT, MEN1, MLH1, MSH2, MSH3, MSH6, MUTYH, NBN, NF1, NHTL1, PALB2, PDGFRA, PMS2, POLD1, POLE, PTEN, RAD50, RAD51C, RAD51D, SDHB, SDHC, SDHD, SMAD4, SMARCA4. STK11, TP53, TSC1, TSC2, and VHL.  The following genes were evaluated for sequence changes only: SDHA and HOXB13 c.251G>A variant only. The report date is September 25, 2017.     Interval History: Overall doing well.  Had thoracentesis recently.  Notes that she has had some oozing from granuloma on the inferior aspect of her stoma twice, only after she remove the bag and was cleaning the colon.  As noted some increased prolapse of her stoma, seems to be worse over the course of the day, more retracted after she gets up in the morning.  Denies any difficulty with bowel function.  Past Medical/Surgical History: 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)    Esophageal stricture    Family history of breast cancer    Family history of colon cancer    Family history of melanoma    Family history of prostate cancer    Fibroids 1998   uterine, history of (left ovaries)   GERD (gastroesophageal reflux disease)  History of hiatal hernia    History of uterine prolapse    Hx of adenomatous polyp of colon 10/19/2014   Hyperlipidemia    Hypertension    borderline no meds diet and exercise    Neuromuscular disorder (HCC)    neuropathy from chemo   Obesity    Ovarian cancer (HCC)    PVC (premature ventricular contraction)    first dx by holter in 1980's, echo (6/10) EF 55-60%, normal diastolic fxn, normal size RV and fxn, mild MR, PASP   Squamous cell carcinoma of arm    ovarian   Tubal pregnancy    Ulcerative proctosigmoiditis (HCC)    Urge urinary incontinence 06/03/2007   Qualifier: Diagnosis of  By: Lindwood Qua CMA, Jerl Santos      Past Surgical History:  Procedure Laterality Date   BILATERAL SALPINGECTOMY Bilateral 12/12/2022   Procedure: OPEN BILATERAL SALPINGECTOMY-OOPHORECTOMY;  Surgeon: Carver Fila, MD;  Location: [facility];  Service: Gynecology;  Laterality: Bilateral;   BLADDER NECK SUSPENSION  1998   BREAST BIOPSY Left    BREAST LUMPECTOMY Left    benign   CATARACT EXTRACTION Right 01/2020   CATARACT EXTRACTION W/PHACO Left 08/12/2020   Procedure: CATARACT EXTRACTION PHACO AND INTRAOCULAR LENS PLACEMENT LEFT EYE;  Surgeon: Fabio Pierce, MD;  Location: [facility];  Service: Ophthalmology;  Laterality: Left;  left CDE=10.71   COLONOSCOPY  05/18/2011/10/12/14   Dr. Stan Head   DEBULKING N/A 12/12/2022   Procedure: RADICAL TUMOR DEBULKING, PERITONEAL STRIPPING, MOBILIZATION SPLENIC FLEXURE, RECTOSIGMOID RESECTION,SMALL BOWEL RESECTION AND REANASTOMOSIS ,END TO END DESCENDING COLOSTOMY;  Surgeon: Carver Fila, MD;  Location: [facility];  Service: Gynecology;  Laterality: N/A;   ECTOPIC PREGNANCY SURGERY  1980   ESOPHAGOGASTRODUODENOSCOPY (EGD) WITH ESOPHAGEAL DILATION  04/03/2012   Dr. Stan Head   INCISION AND DRAINAGE ABSCESS N/A 01/02/2023   Procedure: INCISION AND DRAINAGE OF ABDOMINAL WOUND SEROMA;  Surgeon: Antionette Char, MD;  Location: [facility];  Service: Gynecology;  Laterality: N/A;   IR IMAGING GUIDED PORT INSERTION  09/06/2022   OMENTECTOMY N/A 12/12/2022   Procedure: OMENTECTOMY;  Surgeon: Carver Fila, MD;  Location: [facility];   Service: Gynecology;  Laterality: N/A;   PARTIAL HYSTERECTOMY     abdominal, fibroids, and prolapse (1998) bladder tack   SALPINGOOPHORECTOMY Bilateral 12/12/2022   Procedure: DIAGNOSTIC LAPAROSCOPY;  Surgeon: Carver Fila, MD;  Location: [facility];  Service: Gynecology;  Laterality: Bilateral;   SKIN CANCER EXCISION     UPPER GASTROINTESTINAL ENDOSCOPY      Family History  Problem Relation Age of Onset   Heart attack Mother 68   Transient ischemic attack Mother    Diabetes type II Mother    Sudden death Mother        died age 13   Diabetes Mother    Coronary artery disease Mother    Skin cancer Mother    Barrett's esophagus Mother    Sudden death Father 67       "coronary arteriosclerosis" on death certificate   Breast cancer Sister        dx in her 24s   Colon polyps Sister        adenomatous   Arrhythmia Brother    Colon cancer Brother 73   Congestive Heart Failure Maternal Grandmother    Prostate cancer Maternal Grandfather        dx in 30s   Tuberculosis Paternal Grandfather    Ehlers-Danlos syndrome Daughter    Colon cancer Son 29  lynch syndrome   Other Son        parotid gland tumor   Skin cancer Maternal Aunt    Barrett's esophagus Maternal Aunt    Melanoma Maternal Uncle    Colon cancer Cousin        mid 60s; maternal cousin   Colon cancer Other        MGMs brother   Esophageal cancer Neg Hx    Stomach cancer Neg Hx    Rectal cancer Neg Hx    Uterine cancer Neg Hx    Bladder Cancer Neg Hx     Social History   Socioeconomic History   Marital status: Married    Spouse name: Not on file   Number of children: 3   Years of education: Not on file   Highest education level: Not on file  Occupational History   Occupation: International aid/development worker    Comment: retired 2009    Employer: unemployed  Tobacco Use   Smoking status: Never   Smokeless tobacco: Never  Vaping Use   Vaping status: Never Used  Substance and Sexual Activity   Alcohol use: No     Alcohol/week: 0.0 standard drinks of alcohol    Comment: rarely beer   Drug use: No   Sexual activity: Not Currently  Other Topics Concern   Not on file  Social History Narrative   Retired International aid/development worker, 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)   Grown children   Never smoker no alcohol tobacco or drug use   Social Drivers of Corporate investment banker Strain: Low Risk  (08/28/2022)   Overall Financial Resource Strain (CARDIA)    Difficulty of Paying Living Expenses: Not hard at all  Food Insecurity: No Food Insecurity (01/10/2023)   Hunger Vital Sign    Worried About Running Out of Food in the Last Year: Never true    Ran Out of Food in the Last Year: Never true  Transportation Needs: No Transportation Needs (01/10/2023)   PRAPARE - Administrator, Civil Service (Medical): No    Lack of Transportation (Non-Medical): No  Physical Activity: Inactive (08/28/2022)   Exercise Vital Sign    Days of Exercise per Week: 0 days    Minutes of Exercise per Session: 0 min  Stress: No Stress Concern Present (08/28/2022)   [employer] of Occupational Health - Occupational Stress Questionnaire    Feeling of Stress : Not at all  Social Connections: Moderately Integrated (08/28/2022)   Social Connection and Isolation Panel [NHANES]    Frequency of Communication with Friends and Family: More than three times a week    Frequency of Social Gatherings with Friends and Family: More than three times a week    Attends Religious Services: Never    Database administrator or Organizations: Yes    Attends Engineer, structural: More than 4 times per year    Marital Status: Married    Current Medications:  Current Outpatient Medications:    apixaban (ELIQUIS) 5 MG TABS tablet, Take 1 tablet by mouth twice daily, Disp: 180 tablet, Rfl: 0   Ascorbic Acid (VITAMIN C) 1000 MG tablet, Take 1,000 mg by mouth daily., Disp: , Rfl:    calcium citrate-vitamin D (CITRACAL+D) 315-200  MG-UNIT per tablet, Take 1 tablet by mouth 2 (two) times daily., Disp: , Rfl:    CARBOPLATIN IV, Inject into the vein every 21 ( twenty-one) days. port, Disp: , Rfl:    Cholecalciferol (VITAMIN  D-3) 5000 UNITS TABS, Take 5,000 Units by mouth daily., Disp: , Rfl:    ciprofloxacin (CIPRO) 500 MG tablet, Take 1 tablet (500 mg total) by mouth 2 (two) times daily., Disp: 20 tablet, Rfl: 0   Coenzyme Q10 (CO Q-10) 200 MG CAPS, Take 200 mg by mouth daily., Disp: , Rfl:    CRANBERRY PO, Take 650 mg by mouth daily., Disp: , Rfl:    Glucosamine-Chondroitin (GLUCOSAMINE CHONDR COMPLEX PO), Take 2 capsules by mouth daily., Disp: , Rfl:    GRAPE SEED EXTRACT PO, Take 1 capsule by mouth daily., Disp: , Rfl:    ketoconazole (NIZORAL) 2 % cream, Apply 1 Application topically daily. To yeast rash, Disp: 30 g, Rfl: 1   lansoprazole (PREVACID) 15 MG capsule, Take 1 capsule (15 mg total) by mouth 2 (two) times daily before a meal., Disp: , Rfl:    lidocaine-prilocaine (EMLA) cream, Apply to affected area once (Patient taking differently: Apply 1 Application topically daily as needed (prior to port access).), Disp: 30 g, Rfl: 3   milk thistle 175 MG tablet, Take 175 mg by mouth daily., Disp: , Rfl:    mirtazapine (REMERON) 15 MG tablet, Take 1 tablet (15 mg total) by mouth at bedtime., Disp: 30 tablet, Rfl: 4   niraparib tosylate (ZEJULA) 200 MG tablet, Take 1 tablet (200 mg total) by mouth daily. May take at bedtime to reduce nausea and vomiting., Disp: 30 tablet, Rfl: 1   Omega-3 Fatty Acids (FISH OIL TRIPLE STRENGTH PO), Take 1 capsule by mouth daily., Disp: , Rfl:    ondansetron (ZOFRAN) 8 MG tablet, Take 1 tablet (8 mg total) by mouth every 8 (eight) hours as needed for nausea or vomiting. Start on the third day after carboplatin., Disp: 30 tablet, Rfl: 1   PACLITAXEL IV, Inject into the vein every 21 ( twenty-one) days. port, Disp: , Rfl:    phenazopyridine (PYRIDIUM) 200 MG tablet, Take 1 tablet (200 mg total) by  mouth 3 (three) times daily as needed for pain., Disp: 10 tablet, Rfl: 0   Polyethyl Glycol-Propyl Glycol (SYSTANE OP), Place 1 drop into both eyes 4 (four) times daily as needed (for dryness)., Disp: , Rfl:    PREVACID 24HR 15 MG capsule, Take 15 mg by mouth See admin instructions. Take 15 mg by mouth in the morning before any food and an additional 15 mg once a day as needed for unresolved reflux, Disp: , Rfl:    prochlorperazine (COMPAZINE) 10 MG tablet, Take 10 mg by mouth every 6 (six) hours as needed for nausea or vomiting., Disp: , Rfl:    rosuvastatin (CRESTOR) 20 MG tablet, Take 1 tablet (20 mg total) by mouth at bedtime., Disp: 90 tablet, Rfl: 3   simethicone (MYLICON) 80 MG chewable tablet, Chew 1 tablet (80 mg total) by mouth 4 (four) times daily as needed for flatulence., Disp: 30 tablet, Rfl: 4   temazepam (RESTORIL) 30 MG capsule, Take 1 capsule (30 mg total) by mouth at bedtime as needed for sleep., Disp: 30 capsule, Rfl: 0   traMADol (ULTRAM) 50 MG tablet, Take 1 tablet (50 mg total) by mouth every 6 (six) hours as needed for severe pain (pain score 7-10). For AFTER surgery only, do not take and drive, Disp: 15 tablet, Rfl: 0   traZODone (DESYREL) 50 MG tablet, Take 1 tablet (50 mg total) by mouth at bedtime as needed for sleep., Disp: 30 tablet, Rfl: 1   vitamin B-12 (CYANOCOBALAMIN) 1000 MCG tablet, Take  1,000 mcg by mouth daily., Disp: , Rfl:    zinc gluconate 50 MG tablet, Take 50 mg by mouth daily., Disp: , Rfl:   Review of Symptoms: Pertinent positives as per HPI.  Physical Exam: Deferred given limitations of phone visit.  Laboratory & Radiologic Studies: None new  Assessment & Plan: Rachel Carlson is a 78 y.o. woman with Stage IVB primary peritoneal carcinoma who presents for phone visit. S/p 4 cycles NACT (C/T), IDS (11/2022 - exlap, BSO, rectosigmoid resection with end ostomy, omentectomy, small bowel resection). Now s/p 4 cycles of adj C/T (last on  03/25/23). Imaging on 2/17 with stable diffuse residual peritoneal thickening without nodularity. Moderate loculated left pleural effusion, trace right pleural effusion.  Tentative plan is for PARPi, possible addition of avastin.  NGS: MSS, TMB low, HRD negative, PDL1 CPS 5%, ER+, FOLR1 2+ Germline genetics negative  Patient is overall doing well. Tolerated recent throacentesis without difficulty. Denies much in terms of breathing symptoms prior to procedure. Discussed overall disease status. While CA-125 has decreased, still 714 as of late February. Given persistent chest disease on imaging, we discussed that long term remission is very unlikely. The goal of maintenance therapy is to prolong the time prior to disease progression while hopefully allowing her to maintain some quality of life.   With regard to her stoma, we discussed granulomas and why these occur. If she were to have more bleeding from them moving forward, I asked her to call my clinic to come in for [place] to treat with silver nitrate. I'm hopeful that having her cut the opening to the bag a little bigger will help reduce any irritation caused by the bag. We also discussed her prolapse. This seems to worsen over the course of the day when she is up and moving around and get better when she is laying flat and resting at night.  We discussed that if she had acute worsening of the prolapse, she could try putting granulated sugar on it.  We also discussed trial of cold compresses.  Also hopeful that creating a slightly bigger hole for her ostomy appliance may help reduce further prolapse.  We discussed using an ostomy appliance belt or even a hernia belt.  I suggested that she speak more with the ostomy clinic staff about this.  The patient asked about the possibility of ostomy reversal.  This was performed in the setting of burden of abdominal and pelvic disease, diverticular disease.  I spoke with some of our colorectal specialist who, in the  setting of her imaging, recommend at least 2 years of disease monitoring before consideration of surgery to reverse her ostomy.  We discussed the risk given persistent active disease of taking her off treatment long enough to perform the surgery.  We had another frank conversation about her cancer.  Overall, the majority of her disease burden is in her chest.  CT findings of her abdomen and pelvis have been relatively stable since surgery.  Her CA125 overall continues to be decreasing.  She will be transitioning to a PARP inhibitor for maintenance with consideration of the addition of bevacizumab depending on how she tolerates the oral chemotherapy.  I stressed again that cure is unlikely and that our goals are disease stability without compromising quality of life.  I discussed the assessment and treatment plan with the patient. The patient was provided with an opportunity to ask questions and all were answered. The patient agreed with the plan and demonstrated an understanding of  the instructions.   The patient was advised to call back or see an in-person evaluation if the symptoms worsen or if the condition fails to improve as anticipated.   32 minutes of total time was spent for this patient encounter, including preparation, phone counseling with the patient and coordination of care, and documentation of the encounter.   Eugene Garnet, MD  Division of Gynecologic Oncology  Department of Obstetrics and Gynecology  [facility]

## 2023-04-19 ENCOUNTER — Encounter: Payer: Self-pay | Admitting: Gynecologic Oncology

## 2023-04-19 ENCOUNTER — Encounter: Payer: Self-pay | Admitting: Hematology

## 2023-04-22 ENCOUNTER — Inpatient Hospital Stay: Payer: Medicare Other | Admitting: Hematology

## 2023-04-22 ENCOUNTER — Inpatient Hospital Stay: Payer: Medicare Other

## 2023-04-22 ENCOUNTER — Other Ambulatory Visit: Payer: Self-pay

## 2023-04-22 VITALS — BP 135/73 | HR 93 | Temp 97.8°F | Resp 18

## 2023-04-22 VITALS — Wt 160.3 lb

## 2023-04-22 DIAGNOSIS — Z85828 Personal history of other malignant neoplasm of skin: Secondary | ICD-10-CM | POA: Diagnosis not present

## 2023-04-22 DIAGNOSIS — Z95828 Presence of other vascular implants and grafts: Secondary | ICD-10-CM

## 2023-04-22 DIAGNOSIS — Z807 Family history of other malignant neoplasms of lymphoid, hematopoietic and related tissues: Secondary | ICD-10-CM | POA: Diagnosis not present

## 2023-04-22 DIAGNOSIS — C800 Disseminated malignant neoplasm, unspecified: Secondary | ICD-10-CM

## 2023-04-22 DIAGNOSIS — Z79899 Other long term (current) drug therapy: Secondary | ICD-10-CM | POA: Diagnosis not present

## 2023-04-22 DIAGNOSIS — Z801 Family history of malignant neoplasm of trachea, bronchus and lung: Secondary | ICD-10-CM | POA: Diagnosis not present

## 2023-04-22 DIAGNOSIS — C579 Malignant neoplasm of female genital organ, unspecified: Secondary | ICD-10-CM | POA: Diagnosis not present

## 2023-04-22 DIAGNOSIS — R002 Palpitations: Secondary | ICD-10-CM | POA: Diagnosis not present

## 2023-04-22 DIAGNOSIS — G629 Polyneuropathy, unspecified: Secondary | ICD-10-CM | POA: Diagnosis not present

## 2023-04-22 DIAGNOSIS — Z90722 Acquired absence of ovaries, bilateral: Secondary | ICD-10-CM | POA: Diagnosis not present

## 2023-04-22 DIAGNOSIS — J90 Pleural effusion, not elsewhere classified: Secondary | ICD-10-CM | POA: Diagnosis not present

## 2023-04-22 DIAGNOSIS — K660 Peritoneal adhesions (postprocedural) (postinfection): Secondary | ICD-10-CM

## 2023-04-22 DIAGNOSIS — C482 Malignant neoplasm of peritoneum, unspecified: Secondary | ICD-10-CM | POA: Diagnosis not present

## 2023-04-22 DIAGNOSIS — Z86718 Personal history of other venous thrombosis and embolism: Secondary | ICD-10-CM | POA: Diagnosis not present

## 2023-04-22 DIAGNOSIS — C786 Secondary malignant neoplasm of retroperitoneum and peritoneum: Secondary | ICD-10-CM

## 2023-04-22 DIAGNOSIS — Z90710 Acquired absence of both cervix and uterus: Secondary | ICD-10-CM | POA: Diagnosis not present

## 2023-04-22 DIAGNOSIS — K5900 Constipation, unspecified: Secondary | ICD-10-CM | POA: Diagnosis not present

## 2023-04-22 DIAGNOSIS — Z800 Family history of malignant neoplasm of digestive organs: Secondary | ICD-10-CM | POA: Diagnosis not present

## 2023-04-22 DIAGNOSIS — Z7901 Long term (current) use of anticoagulants: Secondary | ICD-10-CM | POA: Diagnosis not present

## 2023-04-22 DIAGNOSIS — G4700 Insomnia, unspecified: Secondary | ICD-10-CM | POA: Diagnosis not present

## 2023-04-22 LAB — COMPREHENSIVE METABOLIC PANEL
ALT: 10 U/L (ref 0–44)
AST: 15 U/L (ref 15–41)
Albumin: 3.5 g/dL (ref 3.5–5.0)
Alkaline Phosphatase: 56 U/L (ref 38–126)
Anion gap: 12 (ref 5–15)
BUN: 17 mg/dL (ref 8–23)
CO2: 21 mmol/L — ABNORMAL LOW (ref 22–32)
Calcium: 9.6 mg/dL (ref 8.9–10.3)
Chloride: 103 mmol/L (ref 98–111)
Creatinine, Ser: 0.58 mg/dL (ref 0.44–1.00)
GFR, Estimated: >60 mL/min (ref >60)
Glucose, Bld: 98 mg/dL (ref 70–99)
Potassium: 3.7 mmol/L (ref 3.5–5.1)
Sodium: 136 mmol/L (ref 135–145)
Total Bilirubin: 0.7 mg/dL (ref 0.0–1.2)
Total Protein: 6.5 g/dL (ref 6.5–8.1)

## 2023-04-22 LAB — CBC WITH DIFFERENTIAL/PLATELET
Abs Immature Granulocytes: 0.01 10*3/uL (ref 0.00–0.07)
Basophils Absolute: 0 10*3/uL (ref 0.0–0.1)
Basophils Relative: 1 %
Eosinophils Absolute: 0 10*3/uL (ref 0.0–0.5)
Eosinophils Relative: 1 %
HCT: 31.5 % — ABNORMAL LOW (ref 36.0–46.0)
Hemoglobin: 10.4 g/dL — ABNORMAL LOW (ref 12.0–15.0)
Immature Granulocytes: 0 %
Lymphocytes Relative: 18 %
Lymphs Abs: 0.8 10*3/uL (ref 0.7–4.0)
MCH: 30.9 pg (ref 26.0–34.0)
MCHC: 33 g/dL (ref 30.0–36.0)
MCV: 93.5 fL (ref 80.0–100.0)
Monocytes Absolute: 0.5 10*3/uL (ref 0.1–1.0)
Monocytes Relative: 12 %
Neutro Abs: 2.9 10*3/uL (ref 1.7–7.7)
Neutrophils Relative %: 68 %
Platelets: 179 10*3/uL (ref 150–400)
RBC: 3.37 MIL/uL — ABNORMAL LOW (ref 3.87–5.11)
RDW: 16.9 % — ABNORMAL HIGH (ref 11.5–15.5)
WBC: 4.2 10*3/uL (ref 4.0–10.5)
nRBC: 0 % (ref 0.0–0.2)

## 2023-04-22 LAB — MAGNESIUM: Magnesium: 1.8 mg/dL (ref 1.7–2.4)

## 2023-04-22 MED ORDER — SODIUM CHLORIDE 0.9% FLUSH
10.0000 mL | INTRAVENOUS | Status: DC | PRN
  Administered 2023-04-22: 10 mL via INTRAVENOUS

## 2023-04-22 MED ORDER — HEPARIN SOD (PORK) LOCK FLUSH 100 UNIT/ML IV SOLN
500.0000 [IU] | Freq: Once | INTRAVENOUS | Status: AC
  Administered 2023-04-22: 500 [IU] via INTRAVENOUS

## 2023-04-22 NOTE — Progress Notes (Deleted)
Patient is taking Lynparza as prescribed.  She has not missed any doses and reports no side effects at this time.   

## 2023-04-22 NOTE — Progress Notes (Signed)
 [facility] [address], [place] [address]    Clinic Day:  04/22/2023  Referring physician: Tower, Rachel Gallus, MD  Patient Care Team: Carlson, Rachel Gallus, MD as PCP - General Rachel Massed, MD as Medical Oncologist (Medical Oncology) Rachel Sarah, RN as Oncology Nurse Navigator (Medical Oncology)   ASSESSMENT & PLAN:   Assessment: 1.  Peritoneal carcinomatosis/ovarian cancer: - Presentation: 57-month history of abdominal distention and bloating. - Last colonoscopy (01/01/2020): No evidence of malignancy. - CTAP (08/23/2022): Moderate ascites, probable omental caking in the left lower quadrant, possible small peritoneal implants seen in the pelvis.  Diverticulosis of descending and sigmoid colon with moderate wall thickening of the distal sigmoid colon.  Small bilateral pleural effusions. - 08/26/2022: CA125: 7356, CEA: 1 - Germline mutation testing (09/16/2017): Negative. - 4 cycles of carboplatin and paclitaxel from 09/12/2022 through 11/14/2022 - 12/12/2022: Tumor debulking surgery including total omentectomy, resection of peritoneal disease, small bowel resection with reanastomosis, en bloc resection of the left fallopian tube and ovary as well as rectosigmoid colon, left ureterolysis, right salpingo-oophorectomy, end descending colostomy by Dr. Pricilla Carlson - Pathology: Omental resection-high-grade serous carcinoma, cecal implant-high-grade serous carcinoma, ileal resection-mesenteric and serosal involvement by high-grade serous carcinoma, left lower abdominal wall nodule-high-grade serous carcinoma, right tube and ovary resection-high-grade serous carcinoma involving serosa, bladder peritoneum and rectosigmoid colon-high-grade serous carcinoma involving colonic wall and extensive involvement of the serosa and mesenteric adipose tissue.  Metastatic carcinoma involving 8/13 nodes.  pT3 cpN1a. - NGS: MSI-stable, TMB-low, HRD-negative, T p53 pathogenic variant, PD-L1 (22  C3) CPS 5, ER 2+ positive, FOLR1 2+, 75% (mirvetuximab) - 4 cycles of carboplatin and paclitaxel from 01/17/2023 through 03/25/2023 - Germline mutation testing: Negative - Niraparib 200 mg daily started on 04/22/2023   2. Social/Family History: -She lives at home with husband and is his primary caregiver. She is independent ADLS and iADLS. Retired International aid/development worker. No tobacco use.  -She had skin squamous cell carcinoma. Her brother had colon cancer at 55 and follicular non-Hodgkin's lymphoma and DLBCL, her maternal grandfather died of prostate cancer, maternal first cousin had colon cancer, and maternal aunt died of lung cancer (smoker).     Plan: 1.  Peritoneal carcinomatosis/ovarian cancer: - CT CAP (03/17/2022): No significant change in diffuse residual peritoneal thickening without discrete visible nodularity.  Moderate loculated appearing left pleural effusion.  Trace right pleural effusion.  No evidence of lymphadenopathy or metastatic disease to the liver. - She has completed 4 cycles of chemotherapy after surgery on 03/25/2023. - Reviewed labs today: CBC grossly normal with mild anemia.  LFTs and creatinine were normal. - She reports ostomy stoma prolapsing occasionally.  She has follow-up with stoma nurse in 1 week.  She uses MiraLAX for constipation. - She has received shipment of niraparib.  We talked about side effects including hypertension, GI toxicity, anemia, thrombocytopenia, elevated LFTs, increased creatinine among others. - We will continue niraparib 200 mg daily until progression or intolerance. - Recommend follow-up in 3 weeks with repeat labs.  After she is stable on niraparib, will consider adding bevacizumab as she has recurrent left pleural effusion.    2.  Left pleural effusion: - Left thoracentesis on 03/29/2023 with 1.2 L removed.  Prior to that 2 L were removed on 03/06/2023.  Continue thoracentesis every 6 to 8 weeks as needed.   3.  Left gastrocnemius/calf vein distal DVT  (Dx 07/24/2022): - Continue Eliquis twice daily.  No bleeding issues reported.   4.  Peripheral neuropathy: -  Tingling in the fingertips is constant and stable.  Neuropathy in the dose is also stable.   5.  Insomnia: - Continue temazepam 30 mg at bedtime as needed.    No orders of the defined types were placed in this encounter.     Rachel Carlson,acting as a Neurosurgeon for Rachel Massed, MD.,have documented all relevant documentation on the behalf of Rachel Massed, MD,as directed by  Rachel Massed, MD while in the presence of Rachel Massed, MD.  I, Rachel Massed MD, have reviewed the above documentation for accuracy and completeness, and I agree with the above.    Rachel Massed, MD   3/24/20252:42 PM  CHIEF COMPLAINT:   Diagnosis: Peritoneal carcinomatosis/ovarian cancer    Cancer Staging  No matching staging information was found for the patient.    Prior Therapy: 1. Neoadjuvant carboplatin and paclitaxel, 4 cycles, 09/12/22 - 11/14/22 2. Tumor debulking surgery on 12/12/22  Current Therapy:  adjuvant carboplatin and paclitaxel    HISTORY OF PRESENT ILLNESS:   Oncology History Overview Note  CARIS: FOLR1 2+ (75%) ER(2+) HRD-, PDL1 CPS 5%, p53 mutation No mutation in BRCA1/2, ATM, ERBB2, KRAS PR negative   Carcinomatosis (HCC)  08/31/2022 Initial Diagnosis   Carcinomatosis (HCC)   09/12/2022 - 03/25/2023 Chemotherapy   Patient is on Treatment Plan : OVARIAN Carboplatin (AUC 6) + Paclitaxel (175) q21d X 6 Cycles     Gynecologic malignancy (HCC)  12/12/2022 Initial Diagnosis   Gynecologic malignancy (HCC)   02/19/2023 Genetic Testing   Negative genetic testing on the Multi-cancer + RNA panel.  The report date is February 19, 2023.  The Multi-Cancer + RNA Panel offered by [facility] includes sequencing and/or deletion/duplication analysis of the following 70 genes:  AIP*, ALK, APC*, ATM*, AXIN2*, BAP1*, BARD1*, BLM*, BMPR1A*, BRCA1*,  BRCA2*, BRIP1*, CDC73*, CDH1*, CDK4, CDKN1B*, CDKN2A, CHEK2*, CTNNA1*, DICER1*, EPCAM (del/dup only), EGFR, FH*, FLCN*, GREM1 (promoter dup only), HOXB13, KIT, LZTR1, MAX*, MBD4, MEN1*, MET, MITF, MLH1*, MSH2*, MSH3*, MSH6*, MUTYH*, NF1*, NF2*, NTHL1*, PALB2*, PDGFRA, PMS2*, POLD1*, POLE*, POT1*, PRKAR1A*, PTCH1*, PTEN*, RAD51C*, RAD51D*, RB1*, RET, SDHA* (sequencing only), SDHAF2*, SDHB*, SDHC*, SDHD*, SMAD4*, SMARCA4*, SMARCB1*, SMARCE1*, STK11*, SUFU*, TMEM127*, TP53*, TSC1*, TSC2*, VHL*. RNA analysis is performed for * genes.   Negative genetic testing on the common hereditary cancer panel.  The Hereditary Gene Panel offered by [facility] includes sequencing and/or deletion duplication testing of the following 47 genes: APC, ATM, AXIN2, BARD1, BMPR1A, BRCA1, BRCA2, BRIP1, CDH1, CDK4, CDKN2A (p14ARF), CDKN2A (p16INK4a), CHEK2, CTNNA1, DICER1, EPCAM (Deletion/duplication testing only), GREM1 (promoter region deletion/duplication testing only), KIT, MEN1, MLH1, MSH2, MSH3, MSH6, MUTYH, NBN, NF1, NHTL1, PALB2, PDGFRA, PMS2, POLD1, POLE, PTEN, RAD50, RAD51C, RAD51D, SDHB, SDHC, SDHD, SMAD4, SMARCA4. STK11, TP53, TSC1, TSC2, and VHL.  The following genes were evaluated for sequence changes only: SDHA and HOXB13 c.251G>A variant only. The report date is September 25, 2017.      INTERVAL HISTORY:   Rachel Carlson is a 78 y.o. female presenting to clinic today for follow up of Peritoneal carcinomatosis/ovarian cancer. She was last seen by me on 03/25/23.  Today, she states that she is doing well overall. Her appetite level is at 50%. Her energy level is at 50%.  PAST MEDICAL HISTORY:   Past Medical History: 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)    Esophageal stricture    Family  history of breast cancer    Family history of colon cancer    Family history of melanoma    Family  history of prostate cancer    Fibroids 1998   uterine, history of (left ovaries)   GERD (gastroesophageal reflux disease)    History of hiatal hernia    History of uterine prolapse    Hx of adenomatous polyp of colon 10/19/2014   Hyperlipidemia    Hypertension    borderline no meds diet and exercise   Neuromuscular disorder (HCC)    neuropathy from chemo   Obesity    Ovarian cancer (HCC)    PVC (premature ventricular contraction)    first dx by holter in 1980's, echo (6/10) EF 55-60%, normal diastolic fxn, normal size RV and fxn, mild MR, PASP   Squamous cell carcinoma of arm    ovarian   Tubal pregnancy    Ulcerative proctosigmoiditis (HCC)    Urge urinary incontinence 06/03/2007   Qualifier: Diagnosis of  By: Lindwood Qua CMA, Jerl Santos      Surgical History: Past Surgical History:  Procedure Laterality Date   BILATERAL SALPINGECTOMY Bilateral 12/12/2022   Procedure: OPEN BILATERAL SALPINGECTOMY-OOPHORECTOMY;  Surgeon: Carver Fila, MD;  Location: [facility];  Service: Gynecology;  Laterality: Bilateral;   BLADDER NECK SUSPENSION  1998   BREAST BIOPSY Left    BREAST LUMPECTOMY Left    benign   CATARACT EXTRACTION Right 01/2020   CATARACT EXTRACTION W/PHACO Left 08/12/2020   Procedure: CATARACT EXTRACTION PHACO AND INTRAOCULAR LENS PLACEMENT LEFT EYE;  Surgeon: Fabio Pierce, MD;  Location: [facility];  Service: Ophthalmology;  Laterality: Left;  left CDE=10.71   COLONOSCOPY  05/18/2011/10/12/14   Dr. Stan Head   DEBULKING N/A 12/12/2022   Procedure: RADICAL TUMOR DEBULKING, PERITONEAL STRIPPING, MOBILIZATION SPLENIC FLEXURE, RECTOSIGMOID RESECTION,SMALL BOWEL RESECTION AND REANASTOMOSIS ,END TO END DESCENDING COLOSTOMY;  Surgeon: Carver Fila, MD;  Location: [facility];  Service: Gynecology;  Laterality: N/A;   ECTOPIC PREGNANCY SURGERY  1980   ESOPHAGOGASTRODUODENOSCOPY (EGD) WITH ESOPHAGEAL DILATION  04/03/2012   Dr. Stan Head   INCISION AND DRAINAGE  ABSCESS N/A 01/02/2023   Procedure: INCISION AND DRAINAGE OF ABDOMINAL WOUND SEROMA;  Surgeon: Antionette Char, MD;  Location: [facility];  Service: Gynecology;  Laterality: N/A;   IR IMAGING GUIDED PORT INSERTION  09/06/2022   OMENTECTOMY N/A 12/12/2022   Procedure: OMENTECTOMY;  Surgeon: Carver Fila, MD;  Location: [facility];  Service: Gynecology;  Laterality: N/A;   PARTIAL HYSTERECTOMY     abdominal, fibroids, and prolapse (1998) bladder tack   SALPINGOOPHORECTOMY Bilateral 12/12/2022   Procedure: DIAGNOSTIC LAPAROSCOPY;  Surgeon: Carver Fila, MD;  Location: [facility];  Service: Gynecology;  Laterality: Bilateral;   SKIN CANCER EXCISION     UPPER GASTROINTESTINAL ENDOSCOPY      Social History: Social History   Socioeconomic History   Marital status: Married    Spouse name: Not on file   Number of children: 3   Years of education: Not on file   Highest education level: Not on file  Occupational History   Occupation: International aid/development worker    Comment: retired 2009    Employer: unemployed  Tobacco Use   Smoking status: Never   Smokeless tobacco: Never  Vaping Use   Vaping status: Never Used  Substance and Sexual Activity   Alcohol use: No    Alcohol/week: 0.0 standard drinks of alcohol    Comment: rarely beer   Drug use: No   Sexual  activity: Not Currently  Other Topics Concern   Not on file  Social History Narrative   Retired International aid/development worker, 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)   Grown children   Never smoker no alcohol tobacco or drug use   Social Drivers of Corporate investment banker Strain: Low Risk  (08/28/2022)   Overall Financial Resource Strain (CARDIA)    Difficulty of Paying Living Expenses: Not hard at all  Food Insecurity: No Food Insecurity (01/10/2023)   Hunger Vital Sign    Worried About Running Out of Food in the Last Year: Never true    Ran Out of Food in the Last Year: Never true  Transportation Needs: No Transportation Needs (01/10/2023)    PRAPARE - Administrator, Civil Service (Medical): No    Lack of Transportation (Non-Medical): No  Physical Activity: Inactive (08/28/2022)   Exercise Vital Sign    Days of Exercise per Week: 0 days    Minutes of Exercise per Session: 0 min  Stress: No Stress Concern Present (08/28/2022)   [employer] of Occupational Health - Occupational Stress Questionnaire    Feeling of Stress : Not at all  Social Connections: Moderately Integrated (08/28/2022)   Social Connection and Isolation Panel [NHANES]    Frequency of Communication with Friends and Family: More than three times a week    Frequency of Social Gatherings with Friends and Family: More than three times a week    Attends Religious Services: Never    Database administrator or Organizations: Yes    Attends Engineer, structural: More than 4 times per year    Marital Status: Married  Catering manager Violence: Not At Risk (01/10/2023)   Humiliation, Afraid, Rape, and Kick questionnaire    Fear of Current or Ex-Partner: No    Emotionally Abused: No    Physically Abused: No    Sexually Abused: No    Family History: Family History  Problem Relation Age of Onset   Heart attack Mother 73   Transient ischemic attack Mother    Diabetes type II Mother    Sudden death Mother        died age 22   Diabetes Mother    Coronary artery disease Mother    Skin cancer Mother    Barrett's esophagus Mother    Sudden death Father 21       "coronary arteriosclerosis" on death certificate   Breast cancer Sister        dx in her 67s   Colon polyps Sister        adenomatous   Arrhythmia Brother    Colon cancer Brother 58   Congestive Heart Failure Maternal Grandmother    Prostate cancer Maternal Grandfather        dx in 28s   Tuberculosis Paternal Grandfather    Ehlers-Danlos syndrome Daughter    Colon cancer Son 72       lynch syndrome   Other Son        parotid gland tumor   Skin cancer Maternal Aunt     Barrett's esophagus Maternal Aunt    Melanoma Maternal Uncle    Colon cancer Cousin        mid 66s; maternal cousin   Colon cancer Other        MGMs brother   Esophageal cancer Neg Hx    Stomach cancer Neg Hx    Rectal cancer Neg Hx    Uterine cancer Neg  Hx    Bladder Cancer Neg Hx     Current Medications:  Current Outpatient Medications:    apixaban (ELIQUIS) 5 MG TABS tablet, Take 1 tablet by mouth twice daily, Disp: 180 tablet, Rfl: 0   Ascorbic Acid (VITAMIN C) 1000 MG tablet, Take 1,000 mg by mouth daily., Disp: , Rfl:    calcium citrate-vitamin D (CITRACAL+D) 315-200 MG-UNIT per tablet, Take 1 tablet by mouth 2 (two) times daily., Disp: , Rfl:    Cholecalciferol (VITAMIN D-3) 5000 UNITS TABS, Take 5,000 Units by mouth daily., Disp: , Rfl:    ciprofloxacin (CIPRO) 500 MG tablet, Take 1 tablet (500 mg total) by mouth 2 (two) times daily., Disp: 20 tablet, Rfl: 0   Coenzyme Q10 (CO Q-10) 200 MG CAPS, Take 200 mg by mouth daily., Disp: , Rfl:    CRANBERRY PO, Take 650 mg by mouth daily., Disp: , Rfl:    Glucosamine-Chondroitin (GLUCOSAMINE CHONDR COMPLEX PO), Take 2 capsules by mouth daily., Disp: , Rfl:    GRAPE SEED EXTRACT PO, Take 1 capsule by mouth daily., Disp: , Rfl:    ketoconazole (NIZORAL) 2 % cream, Apply 1 Application topically daily. To yeast rash, Disp: 30 g, Rfl: 1   lansoprazole (PREVACID) 15 MG capsule, Take 1 capsule (15 mg total) by mouth 2 (two) times daily before a meal., Disp: , Rfl:    milk thistle 175 MG tablet, Take 175 mg by mouth daily., Disp: , Rfl:    mirtazapine (REMERON) 15 MG tablet, Take 1 tablet (15 mg total) by mouth at bedtime., Disp: 30 tablet, Rfl: 4   niraparib tosylate (ZEJULA) 200 MG tablet, Take 1 tablet (200 mg total) by mouth daily. May take at bedtime to reduce nausea and vomiting., Disp: 30 tablet, Rfl: 1   Omega-3 Fatty Acids (FISH OIL TRIPLE STRENGTH PO), Take 1 capsule by mouth daily., Disp: , Rfl:    phenazopyridine (PYRIDIUM) 200 MG  tablet, Take 1 tablet (200 mg total) by mouth 3 (three) times daily as needed for pain., Disp: 10 tablet, Rfl: 0   Polyethyl Glycol-Propyl Glycol (SYSTANE OP), Place 1 drop into both eyes 4 (four) times daily as needed (for dryness)., Disp: , Rfl:    PREVACID 24HR 15 MG capsule, Take 15 mg by mouth See admin instructions. Take 15 mg by mouth in the morning before any food and an additional 15 mg once a day as needed for unresolved reflux, Disp: , Rfl:    prochlorperazine (COMPAZINE) 10 MG tablet, Take 10 mg by mouth every 6 (six) hours as needed for nausea or vomiting., Disp: , Rfl:    rosuvastatin (CRESTOR) 20 MG tablet, Take 1 tablet (20 mg total) by mouth at bedtime., Disp: 90 tablet, Rfl: 3   simethicone (MYLICON) 80 MG chewable tablet, Chew 1 tablet (80 mg total) by mouth 4 (four) times daily as needed for flatulence., Disp: 30 tablet, Rfl: 4   temazepam (RESTORIL) 30 MG capsule, Take 1 capsule (30 mg total) by mouth at bedtime as needed for sleep., Disp: 30 capsule, Rfl: 0   traMADol (ULTRAM) 50 MG tablet, Take 1 tablet (50 mg total) by mouth every 6 (six) hours as needed for severe pain (pain score 7-10). For AFTER surgery only, do not take and drive, Disp: 15 tablet, Rfl: 0   traZODone (DESYREL) 50 MG tablet, Take 1 tablet (50 mg total) by mouth at bedtime as needed for sleep., Disp: 30 tablet, Rfl: 1   vitamin B-12 (CYANOCOBALAMIN) 1000 MCG tablet,  Take 1,000 mcg by mouth daily., Disp: , Rfl:    zinc gluconate 50 MG tablet, Take 50 mg by mouth daily., Disp: , Rfl:    Allergies: Allergies  Allergen Reactions   Aspartame     Intolerance to artifical sweeteners, pt states caused burning of bladder   Levaquin [Levofloxacin] Nausea Only   Pantoprazole Sodium Diarrhea    Abdominal pain   Ranitidine Diarrhea    abdominal pain   Omeprazole Diarrhea    Abdominal pain  Other Reaction(s): Not available   Sulfasalazine Rash and Other (See Comments)    Fever, chills, headache, muscle pain also     REVIEW OF SYSTEMS:   Review of Systems  Constitutional:  Negative for chills, fatigue and fever.  HENT:   Negative for lump/mass, mouth sores, nosebleeds, sore throat and trouble swallowing.   Eyes:  Negative for eye problems.  Respiratory:  Negative for cough and shortness of breath.   Cardiovascular:  Positive for palpitations. Negative for chest pain and leg swelling.  Gastrointestinal:  Positive for constipation. Negative for abdominal pain, diarrhea, nausea and vomiting.  Genitourinary:  Negative for bladder incontinence, difficulty urinating, dysuria, frequency, hematuria and nocturia.   Musculoskeletal:  Negative for arthralgias, back pain, flank pain, myalgias and neck pain.  Skin:  Negative for itching and rash.  Neurological:  Positive for numbness. Negative for dizziness and headaches.  Hematological:  Does not bruise/bleed easily.  Psychiatric/Behavioral:  Positive for sleep disturbance. Negative for depression and suicidal ideas. The patient is not nervous/anxious.   All other systems reviewed and are negative.    VITALS:   Weight 160 lb 4.4 oz (72.7 kg), last menstrual period 05/29/1996.  Wt Readings from Last 3 Encounters:  04/22/23 160 lb 4.4 oz (72.7 kg)  03/25/23 159 lb 6.3 oz (72.3 kg)  03/11/23 153 lb (69.4 kg)    Body mass index is 29.31 kg/m.  Performance status (ECOG): 1 - Symptomatic but completely ambulatory  PHYSICAL EXAM:   Physical Exam Vitals and nursing note reviewed. Exam conducted with a chaperone present.  Constitutional:      Appearance: Normal appearance.  Cardiovascular:     Rate and Rhythm: Normal rate and regular rhythm.     Pulses: Normal pulses.     Heart sounds: Normal heart sounds.  Pulmonary:     Effort: Pulmonary effort is normal.     Breath sounds: Normal breath sounds.  Abdominal:     Palpations: Abdomen is soft. There is no hepatomegaly, splenomegaly or mass.     Tenderness: There is no abdominal tenderness.   Musculoskeletal:     Right lower leg: No edema.     Left lower leg: No edema.  Lymphadenopathy:     Cervical: No cervical adenopathy.     Right cervical: No superficial, deep or posterior cervical adenopathy.    Left cervical: No superficial, deep or posterior cervical adenopathy.     Upper Body:     Right upper body: No supraclavicular or axillary adenopathy.     Left upper body: No supraclavicular or axillary adenopathy.  Neurological:     General: No focal deficit present.     Mental Status: She is alert and oriented to person, place, and time.  Psychiatric:        Mood and Affect: Mood normal.        Behavior: Behavior normal.     LABS:   CBC     Component Value Date/Time   WBC 4.2 04/22/2023 1346  RBC 3.37 (L) 04/22/2023 1346   HGB 10.4 (L) 04/22/2023 1346   HGB 9.8 (L) 12/31/2022 1551   HCT 31.5 (L) 04/22/2023 1346   PLT 179 04/22/2023 1346   PLT 361 12/31/2022 1551   MCV 93.5 04/22/2023 1346   MCH 30.9 04/22/2023 1346   MCHC 33.0 04/22/2023 1346   RDW 16.9 (H) 04/22/2023 1346   LYMPHSABS 0.8 04/22/2023 1346   MONOABS 0.5 04/22/2023 1346   EOSABS 0.0 04/22/2023 1346   BASOSABS 0.0 04/22/2023 1346    CMP      Component Value Date/Time   NA 136 04/22/2023 1346   K 3.7 04/22/2023 1346   CL 103 04/22/2023 1346   CO2 21 (L) 04/22/2023 1346   GLUCOSE 98 04/22/2023 1346   BUN 17 04/22/2023 1346   CREATININE 0.58 04/22/2023 1346   CALCIUM 9.6 04/22/2023 1346   PROT 6.5 04/22/2023 1346   ALBUMIN 3.5 04/22/2023 1346   AST 15 04/22/2023 1346   ALT 10 04/22/2023 1346   ALKPHOS 56 04/22/2023 1346   BILITOT 0.7 04/22/2023 1346   GFRNONAA >60 04/22/2023 1346   GFRAA 94 06/04/2007 1522     Lab Results  Component Value Date   CEA1 1.0 08/26/2022   /  CEA  Date Value Ref Range Status  08/26/2022 1.0 0.0 - 4.7 ng/mL Final    Comment:    (NOTE)                             Nonsmokers          <3.9                             Smokers              <5.6 [employer] Electrochemiluminescence Immunoassay (ECLIA) Values obtained with different assay methods or kits cannot be used interchangeably.  Results cannot be interpreted as absolute evidence of the presence or absence of malignant disease. Performed At: [facility] [address] [place], [place] [id number] Jolene Schimke MD [id number]    No results found for: "PSA1" No results found for: "UVO536" Lab Results  Component Value Date   UYQ034 714.0 (H) 03/25/2023    No results found for: "TOTALPROTELP", "ALBUMINELP", "A1GS", "A2GS", "BETS", "BETA2SER", "GAMS", "MSPIKE", "SPEI" Lab Results  Component Value Date   TIBC 369 10/24/2022   FERRITIN 22 10/24/2022   IRONPCTSAT 10 (L) 10/24/2022   No results found for: "LDH"   STUDIES:   US THORACENTESIS ASP PLEURAL SPACE W/IMG GUIDE Result Date: 03/29/2023 INDICATION: Patient with a history of metastatic ovarian cancer with recurrent pleural effusions. Interventional radiology asked to perform a therapeutic thoracentesis. EXAM: ULTRASOUND GUIDED THORACENTESIS MEDICATIONS: 1% lidocaine 10 mL COMPLICATIONS: None immediate. PROCEDURE: An ultrasound guided thoracentesis was thoroughly discussed with the patient and questions answered. The benefits, risks, alternatives and complications were also discussed. The patient understands and wishes to proceed with the procedure. Written consent was obtained. Ultrasound was performed to localize and mark an adequate pocket of fluid in the left chest. The area was then prepped and draped in the normal sterile fashion. 1% Lidocaine was used for local anesthesia. Under ultrasound guidance a 19 gauge, 7-cm, Yueh catheter was introduced. Thoracentesis was performed. The catheter was removed and a dressing applied. FINDINGS: A total of approximately 1.2 L of amber colored fluid was removed. IMPRESSION: Successful ultrasound guided left thoracentesis yielding  1.2 L of pleural fluid.  Procedure performed by Alwyn Ren NP Electronically Signed   By: Marliss Coots M.D.   On: 03/29/2023 14:56   DG Chest 1 View Result Date: 03/29/2023 CLINICAL DATA:  Status post thoracentesis. EXAM: CHEST  1 VIEW COMPARISON:  Chest radiograph dated 03/28/2023. FINDINGS: Right-sided Port-A-Cath in similar position. Interval decrease in the size of left pleural effusion since the prior radiograph. No pneumothorax. Stable cardiomediastinal silhouette. Small hiatal hernia. No acute osseous pathology. IMPRESSION: Interval decrease in the size of left pleural effusion. No pneumothorax. Electronically Signed   By: Elgie Collard M.D.   On: 03/29/2023 11:18   DG Chest 2 View Result Date: 03/28/2023 CLINICAL DATA:  History of pleural effusions. Left-sided pleural effusion. Patient reports she had 2L of fluid taken off of left side 2 weeks ago. EXAM: CHEST - 2 VIEW COMPARISON:  CT chest, abdomen, and pelvis 03/18/2023, [facility] chest 03/06/2023 FINDINGS: Right chest wall porta catheter tip again overlies the central superior vena. Interval increase in now moderate left pleural effusion. Fluid again tracks within left minor fissure minimal blunting of the right costophrenic angle is unchanged from prior. No definite right pleural effusion. No pneumothorax. The visualized cardiac silhouette and mediastinal contours are within normal limits. Mild-to-moderate multilevel degenerative disc changes of the thoracic spine. IMPRESSION: Interval increase in now moderate left pleural effusion. Electronically Signed   By: Neita Garnet M.D.   On: 03/28/2023 14:55

## 2023-04-22 NOTE — Patient Instructions (Addendum)
 [facility] at [facility] Discharge Instructions   You were seen and examined today by Dr. Ellin Saba.  He reviewed the results of your lab work which are normal/stable.   We will see you back in 3 weeks. We will repeat lab work at that time.  Return as scheduled.    Thank you for choosing [facility] at [facility] to provide your oncology and hematology care.  To afford each patient quality time with our provider, please arrive at least 15 minutes before your scheduled appointment time.   If you have a lab appointment with the Cancer Center please come in thru the Main Entrance and check in at the main information desk.  You need to re-schedule your appointment should you arrive 10 or more minutes late.  We strive to give you quality time with our providers, and arriving late affects you and other patients whose appointments are after yours.  Also, if you no show three or more times for appointments you may be dismissed from the clinic at the providers discretion.     Again, thank you for choosing [facility].  Our hope is that these requests will decrease the amount of time that you wait before being seen by our physicians.       _____________________________________________________________  Should you have questions after your visit to [facility], please contact our office at [phone] and follow the prompts.  Our office hours are 8:00 a.m. and 4:30 p.m. Monday - Friday.  Please note that voicemails left after 4:00 p.m. may not be returned until the following business day.  We are closed weekends and major holidays.  You do have access to a nurse 24-7, just call the main number to the clinic [phone] and do not press any options, hold on the line and a nurse will answer the phone.    For prescription refill requests, have your pharmacy contact our office and allow 72 hours.    Due to Covid, you will need to  wear a mask upon entering the hospital. If you do not have a mask, a mask will be given to you at the Main Entrance upon arrival. For doctor visits, patients may have 1 support person age 84 or older with them. For treatment visits, patients can not have anyone with them due to social distancing guidelines and our immunocompromised population.

## 2023-04-22 NOTE — Progress Notes (Signed)
 Patients port flushed without difficulty.  Good blood return noted with no bruising or swelling noted at site.  Band aid applied.  VSS with discharge and left in satisfactory condition with no s/s of distress noted.

## 2023-04-23 LAB — CA 125: Cancer Antigen (CA) 125: 274 U/mL — ABNORMAL HIGH (ref 0.0–38.1)

## 2023-04-25 NOTE — Progress Notes (Unsigned)
  Cardiology Office Note:   Date:  04/26/2023  ID:  Rachel Carlson, DOB 05/21/1945, MRN [id number] PCP: Judy Pimple, MD   HeartCare Providers Cardiologist:  Rollene Rotunda, MD {  History of Present Illness:   Rachel Carlson is a 78 y.o. female who presents for a history of palpitations and PVCs.  She was previously seen by Dr. Shirlee Latch.  She has a family history of sudden cardiac death and CAD.  She had an echo in 2010 which was essentially unremarkable and a Holter that demonstrated PVCs.    She had dyspnea when I saw her.  She had a negative POET (Plain Old Exercise Treadmill) 2018 . Echo in 2023 demonstrated well-preserved ejection fraction.  There were no significant valvular abnormalities.    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  She has had ascites.   She has had thoracentesis and paracentesis.   She has not had any new cardiovascular complaints despite surgery and chemotherapy.  She is fatigued.  She is not having any new shortness of breath, PND or orthopnea.  She is not having any new palpitations, presyncope or syncope.  She does have some issues with her stoma currently.  ROS: As stated in the HPI and negative for all other systems.  Studies Reviewed:    EKG:   EKG Interpretation Date/Time:  Friday Rachel 28 2025 09:09:11 EDT Ventricular Rate:  80 PR Interval:  130 QRS Duration:  80 QT Interval:  392 QTC Calculation: 452 R Axis:   4  Text Interpretation: Sinus rhythm with occasional Premature ventricular complexes No significant change since last tracing Confirmed by Rollene Rotunda ([id number]) on 04/26/2023 9:19:46 AM     Risk Assessment/Calculations:              Physical Exam:   VS:  BP 116/69 (BP Location: Left Arm, Patient Position: Sitting, Cuff Size: Normal)   Pulse 80   Ht 5\' 2"  (1.575 m)   Wt 159 lb 12.8 oz (72.5 kg)   LMP 05/29/1996   SpO2 96%   BMI 29.23 kg/m    Wt Readings from Last 3 Encounters:  04/26/23  159 lb 12.8 oz (72.5 kg)  04/22/23 160 lb 4.4 oz (72.7 kg)  03/25/23 159 lb 6.3 oz (72.3 kg)     GEN: Well nourished, well developed in no acute distress NECK: No JVD; No carotid bruits CARDIAC: RRR, very soft apical nonradiating systolic murmurs, rubs, gallops RESPIRATORY:  Clear to auscultation without rales, wheezing or rhonchi  ABDOMEN: Soft, non-tender, non-distended EXTREMITIES:  No edema; No deformity   ASSESSMENT AND PLAN:   DYSLIPIDEMIA:   LDL was 78.  No change in therapy.    HTN:   Her blood pressure is at target.  No change in therapy.   PVCs:   She is not bothered by this.  No change in therapy.   STOMAL DISCOMFORT: I texted back-and-forth with the ostomy nursing and surgery office and have arranged for her to be seen in the surgery office today.  Mom concerned it could be some necrotic tissue.  She is quite uncomfortable and has had a decreased output.     Follow up with me in one year.   Signed, Rollene Rotunda, MD

## 2023-04-26 ENCOUNTER — Observation Stay (HOSPITAL_COMMUNITY)
Admission: AD | Admit: 2023-04-26 | Discharge: 2023-04-27 | Disposition: A | Discharge status: Home / Self Care | Source: Ambulatory Visit | Attending: Gynecologic Oncology | Admitting: Gynecologic Oncology

## 2023-04-26 ENCOUNTER — Encounter: Payer: Self-pay | Admitting: Cardiology

## 2023-04-26 ENCOUNTER — Inpatient Hospital Stay (HOSPITAL_BASED_OUTPATIENT_CLINIC_OR_DEPARTMENT_OTHER): Admitting: Gynecologic Oncology

## 2023-04-26 ENCOUNTER — Ambulatory Visit: Payer: Medicare Other | Attending: Cardiology | Admitting: Cardiology

## 2023-04-26 ENCOUNTER — Encounter (HOSPITAL_COMMUNITY): Payer: Self-pay

## 2023-04-26 ENCOUNTER — Inpatient Hospital Stay

## 2023-04-26 VITALS — BP 132/78 | HR 85 | Temp 98.6°F | Resp 19 | Wt 159.4 lb

## 2023-04-26 VITALS — BP 116/69 | HR 80 | Ht 62.0 in | Wt 159.8 lb

## 2023-04-26 DIAGNOSIS — Z8543 Personal history of malignant neoplasm of ovary: Secondary | ICD-10-CM | POA: Insufficient documentation

## 2023-04-26 DIAGNOSIS — I493 Ventricular premature depolarization: Secondary | ICD-10-CM

## 2023-04-26 DIAGNOSIS — Z85828 Personal history of other malignant neoplasm of skin: Secondary | ICD-10-CM | POA: Insufficient documentation

## 2023-04-26 DIAGNOSIS — K9419 Other complications of enterostomy: Principal | ICD-10-CM | POA: Diagnosis present

## 2023-04-26 DIAGNOSIS — K9409 Other complications of colostomy: Secondary | ICD-10-CM | POA: Diagnosis not present

## 2023-04-26 DIAGNOSIS — C482 Malignant neoplasm of peritoneum, unspecified: Secondary | ICD-10-CM

## 2023-04-26 DIAGNOSIS — I10 Essential (primary) hypertension: Secondary | ICD-10-CM

## 2023-04-26 DIAGNOSIS — Z86718 Personal history of other venous thrombosis and embolism: Secondary | ICD-10-CM | POA: Diagnosis not present

## 2023-04-26 DIAGNOSIS — K9403 Colostomy malfunction: Secondary | ICD-10-CM | POA: Diagnosis not present

## 2023-04-26 DIAGNOSIS — E785 Hyperlipidemia, unspecified: Secondary | ICD-10-CM

## 2023-04-26 LAB — CBC WITH DIFFERENTIAL (CANCER CENTER ONLY)
Abs Immature Granulocytes: 0.01 10*3/uL (ref 0.00–0.07)
Basophils Absolute: 0 10*3/uL (ref 0.0–0.1)
Basophils Relative: 1 %
Eosinophils Absolute: 0.1 10*3/uL (ref 0.0–0.5)
Eosinophils Relative: 1 %
HCT: 33.2 % — ABNORMAL LOW (ref 36.0–46.0)
Hemoglobin: 11 g/dL — ABNORMAL LOW (ref 12.0–15.0)
Immature Granulocytes: 0 %
Lymphocytes Relative: 17 %
Lymphs Abs: 0.9 10*3/uL (ref 0.7–4.0)
MCH: 30.5 pg (ref 26.0–34.0)
MCHC: 33.1 g/dL (ref 30.0–36.0)
MCV: 92 fL (ref 80.0–100.0)
Monocytes Absolute: 0.7 10*3/uL (ref 0.1–1.0)
Monocytes Relative: 15 %
Neutro Abs: 3.2 10*3/uL (ref 1.7–7.7)
Neutrophils Relative %: 66 %
Platelet Count: 199 10*3/uL (ref 150–400)
RBC: 3.61 MIL/uL — ABNORMAL LOW (ref 3.87–5.11)
RDW: 16.5 % — ABNORMAL HIGH (ref 11.5–15.5)
WBC Count: 4.9 10*3/uL (ref 4.0–10.5)
nRBC: 0 % (ref 0.0–0.2)

## 2023-04-26 LAB — CMP (CANCER CENTER ONLY)
ALT: 7 U/L (ref 0–44)
AST: 11 U/L — ABNORMAL LOW (ref 15–41)
Albumin: 4.1 g/dL (ref 3.5–5.0)
Alkaline Phosphatase: 71 U/L (ref 38–126)
Anion gap: 5 (ref 5–15)
BUN: 12 mg/dL (ref 8–23)
CO2: 27 mmol/L (ref 22–32)
Calcium: 9.6 mg/dL (ref 8.9–10.3)
Chloride: 106 mmol/L (ref 98–111)
Creatinine: 0.64 mg/dL (ref 0.44–1.00)
GFR, Estimated: >60 mL/min (ref >60)
Glucose, Bld: 102 mg/dL — ABNORMAL HIGH (ref 70–99)
Potassium: 3.8 mmol/L (ref 3.5–5.1)
Sodium: 138 mmol/L (ref 135–145)
Total Bilirubin: 0.7 mg/dL (ref 0.0–1.2)
Total Protein: 7 g/dL (ref 6.5–8.1)

## 2023-04-26 MED ORDER — ENOXAPARIN SODIUM 40 MG/0.4ML IJ SOSY
40.0000 mg | PREFILLED_SYRINGE | INTRAMUSCULAR | Status: DC
Start: 2023-04-26 — End: 2023-04-27
  Administered 2023-04-26: 40 mg via SUBCUTANEOUS
  Filled 2023-04-26: qty 0.4

## 2023-04-26 MED ORDER — CHLORHEXIDINE GLUCONATE CLOTH 2 % EX PADS
6.0000 | MEDICATED_PAD | Freq: Every day | CUTANEOUS | Status: DC
  Administered 2023-04-27: 6 via TOPICAL

## 2023-04-26 MED ORDER — SODIUM CHLORIDE 0.9% FLUSH: 10.0000 mL | INTRAVENOUS | Status: DC | PRN

## 2023-04-26 MED ORDER — TRAMADOL HCL 50 MG PO TABS: 50.0000 mg | ORAL_TABLET | Freq: Four times a day (QID) | ORAL | Status: DC | PRN

## 2023-04-26 MED ORDER — SODIUM CHLORIDE 0.9% FLUSH
10.0000 mL | Freq: Two times a day (BID) | INTRAVENOUS | Status: DC
  Administered 2023-04-26 – 2023-04-27 (×2): 10 mL

## 2023-04-26 MED ORDER — ACETAMINOPHEN 325 MG PO TABS: 650.0000 mg | ORAL_TABLET | ORAL | Status: DC | PRN

## 2023-04-26 MED ORDER — ONDANSETRON HCL 4 MG/2ML IJ SOLN
4.0000 mg | Freq: Four times a day (QID) | INTRAMUSCULAR | Status: DC | PRN
Start: 2023-04-26 — End: 2023-04-27

## 2023-04-26 MED ORDER — ONDANSETRON HCL 4 MG PO TABS: 4.0000 mg | ORAL_TABLET | Freq: Four times a day (QID) | ORAL | Status: DC | PRN

## 2023-04-26 MED ORDER — OXYCODONE HCL 5 MG PO TABS: 5.0000 mg | ORAL_TABLET | ORAL | Status: DC | PRN

## 2023-04-26 NOTE — Consult Note (Addendum)
 WOC Nurse ostomy consult note Consult requested for stoma prolapse. Pt is familiar to the WOC team from a previous admission. She had colostomy surgery performed 11/24 and has been followed by the outpatient ostomy clinic several times recently for troubleshooting and pouching assistance.   She states her stoma developed edema and prolapse within the past day and there has been no output.  Dr Pricilla Holm of the surgical team is aware of the situation and no output.  Currently stoma is red and viable and has mod amt edema and prolapse, tight and shiny and oozing clear fluid, the lower inner stoma edges are dark red and slightly indented, not necrotic, where the previous convex ring around the edge of the pouch was causing prolonged pressure to the stoma edges; this is more pronounced to the lower areas.  Placed patient into a 4 inch pouch, which has the largest opening available, and cut to 3 inches to avoid pressure to the site until the edema is resolved and placed barrier ring around the opening to protect skin.  Pt states she is independent with pouch application and emptying prior to admission and uses convex one piece pouches. Ordered 2 sets of large pouches Lawson # [id number] and 2 convex pouches Lawson # [id number]  and barrier rings, [place] # [phone] to the room so she can resume use of this product after edema has resolved.  Thank-you,  Cammie Mcgee MSN, RN, CWOCN, [place], CNS [phone]

## 2023-04-26 NOTE — Patient Instructions (Signed)
 Medication Instructions:  No changes.  *If you need a refill on your cardiac medications before your next appointment, please call your pharmacy*   Follow-Up: At [facility], you and your health needs are our priority.  As part of our continuing mission to provide you with exceptional heart care, our providers are all part of one team.  This team includes your primary Cardiologist (physician) and Advanced Practice Providers or APPs (Physician Assistants and Nurse Practitioners) who all work together to provide you with the care you need, when you need it.  Your next appointment:   1 year(s)  Provider:   Rollene Rotunda, MD     We recommend signing up for the patient portal called "MyChart".  Sign up information is provided on this After Visit Summary.  MyChart is used to connect with patients for Virtual Visits (Telemedicine).  Patients are able to view lab/test results, encounter notes, upcoming appointments, etc.  Non-urgent messages can be sent to your provider as well.   To learn more about what you can do with MyChart, go to [email or web address].   Other Instructions       1st Floor: - Lobby - Registration  - Pharmacy  - Lab - Cafe  2nd Floor: - PV Lab - Diagnostic Testing (echo, CT, nuclear med)  3rd Floor: - Vacant  4th Floor: - TCTS (cardiothoracic surgery) - AFib Clinic - Structural Heart Clinic - Vascular Surgery  - Vascular Ultrasound  5th Floor: - HeartCare Cardiology (general and EP) - Clinical Pharmacy for coumadin, hypertension, lipid, weight-loss medications, and med management appointments    Valet parking services will be available as well.

## 2023-04-26 NOTE — Progress Notes (Signed)
 Gynecologic Oncology Symptom Management Visit   Reason for Visit: evaluation of prolapsing ostomy stoma   Treatment History:     Oncology History Overview Note   CARIS: FOLR1 2+ (75%) ER(2+) HRD-, PDL1 CPS 5%, p53 mutation No mutation in BRCA1/2, ATM, ERBB2, KRAS PR negative    Carcinomatosis (HCC)   08/31/2022 Initial Diagnosis     Carcinomatosis (HCC)     09/12/2022 - 03/25/2023 Chemotherapy     Patient is on Treatment Plan : OVARIAN Carboplatin (AUC 6) + Paclitaxel (175) q21d X 6 Cycles      Gynecologic malignancy (HCC)   12/12/2022 Initial Diagnosis     Gynecologic malignancy (HCC)     02/19/2023 Genetic Testing     Negative genetic testing on the Multi-cancer + RNA panel.  The report date is February 19, 2023.   The Multi-Cancer + RNA Panel offered by [facility] includes sequencing and/or deletion/duplication analysis of the following 70 genes:  AIP*, ALK, APC*, ATM*, AXIN2*, BAP1*, BARD1*, BLM*, BMPR1A*, BRCA1*, BRCA2*, BRIP1*, CDC73*, CDH1*, CDK4, CDKN1B*, CDKN2A, CHEK2*, CTNNA1*, DICER1*, EPCAM (del/dup only), EGFR, FH*, FLCN*, GREM1 (promoter dup only), HOXB13, KIT, LZTR1, MAX*, MBD4, MEN1*, MET, MITF, MLH1*, MSH2*, MSH3*, MSH6*, MUTYH*, NF1*, NF2*, NTHL1*, PALB2*, PDGFRA, PMS2*, POLD1*, POLE*, POT1*, PRKAR1A*, PTCH1*, PTEN*, RAD51C*, RAD51D*, RB1*, RET, SDHA* (sequencing only), SDHAF2*, SDHB*, SDHC*, SDHD*, SMAD4*, SMARCA4*, SMARCB1*, SMARCE1*, STK11*, SUFU*, TMEM127*, TP53*, TSC1*, TSC2*, VHL*. RNA analysis is performed for * genes.    Negative genetic testing on the common hereditary cancer panel.  The Hereditary Gene Panel offered by [facility] includes sequencing and/or deletion duplication testing of the following 47 genes: APC, ATM, AXIN2, BARD1, BMPR1A, BRCA1, BRCA2, BRIP1, CDH1, CDK4, CDKN2A (p14ARF), CDKN2A (p16INK4a), CHEK2, CTNNA1, DICER1, EPCAM (Deletion/duplication testing only), GREM1 (promoter region deletion/duplication testing only), KIT, MEN1, MLH1, MSH2, MSH3, MSH6,  MUTYH, NBN, NF1, NHTL1, PALB2, PDGFRA, PMS2, POLD1, POLE, PTEN, RAD50, RAD51C, RAD51D, SDHB, SDHC, SDHD, SMAD4, SMARCA4. STK11, TP53, TSC1, TSC2, and VHL.  The following genes were evaluated for sequence changes only: SDHA and HOXB13 c.251G>A variant only. The report date is September 25, 2017.       On 12/12/2022, she underwent diagnostic laparoscopy, conversion to exploratory laparotomy with radical tumor debulking including total omentectomy, resection of peritoneal disease, small bowel resection with reanastomosis, en bloc resection of the left fallopian tube and ovary as well as rectosigmoid colon, left ureterolysis, right salpingo-oophorectomy, end descending colostomy, cystoscopy with Dr. Eugene Garnet for primary peritoneal high grade serous carcinoma. Her post-operative course was complicated with an incisional infection with the patient ultimately having the undergo incision and drainage of the post-op wound on 01/02/2023.    Since that time, she has been receiving chemotherapy at [facility]. She has had a CT scan on 76/17/25 returning with no significant change in diffuse, residual peritoneal thickening and moderate left pleural effusion. She underwent two thoracentesis on 03/06/2023 and 03/29/2023. She last received cycle 76 of carboplatin and taxol on 76/24/2025 under the care of Dr. Ellin Saba at [facility].    Interval History: She went for follow up with her cardiologist, Dr. Antoine Poche this am. Our office received a call with concern for possible necrosis and edema of the stoma. An appointment was made for urgent evaluation. She reports feeling overall well. She has been tolerating her diet and has maintained her weight. Appetite has increased since we saw her last. Reporting intermittent fatigue. No bladder issues voiced. Stool consistency from ostomy has been similar to "pancake batter" but for the past two  days, she has had liquid watery stool. Her stoma has become significantly edematous. She has  been seen at the ostomy clinic in the past. She has had progressive prolapse with the edema worsening more recently. No significant bleeding reported except for mild spotting from granulomas intermittently. No pain reported except for discomfort at the stoma. She has struggled with intermittent constipation and recently had to digitally remove hard stool. No fever or chills reported. She has overall been tolerating Zejula without significant side effects.    Past Medical/Surgical History:     Past Medical History:  Diagnosis Date   Blood transfusion without reported diagnosis     Chronic right ear pain      normal MRI   DVT of leg (deep venous thrombosis) (HCC)     Esophageal stricture     Family history of breast cancer     Family history of colon cancer     Family history of melanoma     Family history of prostate cancer     Fibroids 1998    uterine, history of (left ovaries)   GERD (gastroesophageal reflux disease)     History of hiatal hernia     History of uterine prolapse     Hx of adenomatous polyp of colon 10/19/2014   Hyperlipidemia     Hypertension      borderline no meds diet and exercise   Neuromuscular disorder (HCC)      neuropathy from chemo   Obesity     Ovarian cancer (HCC)     PVC (premature ventricular contraction)      first dx by holter in 1980's, echo (6/10) EF 55-60%, normal diastolic fxn, normal size RV and fxn, mild MR, PASP   Squamous cell carcinoma of arm      ovarian   Tubal pregnancy     Ulcerative proctosigmoiditis (HCC)     Urge urinary incontinence 06/03/2007    Qualifier: Diagnosis of  By: Lindwood Qua CMA, Jerl Santos                 Past Surgical History:  Procedure Laterality Date   BILATERAL SALPINGECTOMY Bilateral 12/12/2022    Procedure: OPEN BILATERAL SALPINGECTOMY-OOPHORECTOMY;  Surgeon: Carver Fila, MD;  Location: [facility];  Service: Gynecology;  Laterality: Bilateral;   BLADDER NECK SUSPENSION   1998   BREAST BIOPSY  Left     BREAST LUMPECTOMY Left      benign   CATARACT EXTRACTION Right 01/2020   CATARACT EXTRACTION W/PHACO Left 08/12/2020    Procedure: CATARACT EXTRACTION PHACO AND INTRAOCULAR LENS PLACEMENT LEFT EYE;  Surgeon: Fabio Pierce, MD;  Location: [facility];  Service: Ophthalmology;  Laterality: Left;  left CDE=10.71   COLONOSCOPY   05/18/2011/10/12/14    Dr. Stan Head   DEBULKING N/A 12/12/2022    Procedure: RADICAL TUMOR DEBULKING, PERITONEAL STRIPPING, MOBILIZATION SPLENIC FLEXURE, RECTOSIGMOID RESECTION,SMALL BOWEL RESECTION AND REANASTOMOSIS ,END TO END DESCENDING COLOSTOMY;  Surgeon: Carver Fila, MD;  Location: [facility];  Service: Gynecology;  Laterality: N/A;   ECTOPIC PREGNANCY SURGERY   1980   ESOPHAGOGASTRODUODENOSCOPY (EGD) WITH ESOPHAGEAL DILATION   04/03/2012    Dr. Stan Head   INCISION AND DRAINAGE ABSCESS N/A 01/02/2023    Procedure: INCISION AND DRAINAGE OF ABDOMINAL WOUND SEROMA;  Surgeon: Antionette Char, MD;  Location: [facility];  Service: Gynecology;  Laterality: N/A;   IR IMAGING GUIDED PORT INSERTION   09/06/2022   OMENTECTOMY N/A 12/12/2022    Procedure: OMENTECTOMY;  Surgeon:  Carver Fila, MD;  Location: [facility];  Service: Gynecology;  Laterality: N/A;   PARTIAL HYSTERECTOMY        abdominal, fibroids, and prolapse (1998) bladder tack   SALPINGOOPHORECTOMY Bilateral 12/12/2022    Procedure: DIAGNOSTIC LAPAROSCOPY;  Surgeon: Carver Fila, MD;  Location: [facility];  Service: Gynecology;  Laterality: Bilateral;   SKIN CANCER EXCISION       UPPER GASTROINTESTINAL ENDOSCOPY                   Family History  Problem Relation Age of Onset   Heart attack Mother 27   Transient ischemic attack Mother     Diabetes type II Mother     Sudden death Mother          died age 70   Diabetes Mother     Coronary artery disease Mother     Skin cancer Mother     Barrett's esophagus Mother     Sudden death Father 65        "coronary arteriosclerosis" on death  certificate   Breast cancer Sister          dx in her 51s   Colon polyps Sister          adenomatous   Arrhythmia Brother     Colon cancer Brother 80   Congestive Heart Failure Maternal Grandmother     Prostate cancer Maternal Grandfather          dx in 32s   Tuberculosis Paternal Grandfather     Ehlers-Danlos syndrome Daughter     Colon cancer Son 22        lynch syndrome   Other Son          parotid gland tumor   Skin cancer Maternal Aunt     Barrett's esophagus Maternal Aunt     Melanoma Maternal Uncle     Colon cancer Cousin          mid 42s; maternal cousin   Colon cancer Other          MGMs brother   Esophageal cancer Neg Hx     Stomach cancer Neg Hx     Rectal cancer Neg Hx     Uterine cancer Neg Hx     Bladder Cancer Neg Hx            Social History         Socioeconomic History   Marital status: Married      Spouse name: Not on file   Number of children: 3   Years of education: Not on file   Highest education level: Not on file  Occupational History   Occupation: International aid/development worker      Comment: retired 2009      Employer: unemployed  Tobacco Use   Smoking status: Never   Smokeless tobacco: Never  Vaping Use   Vaping status: Never Used  Substance and Sexual Activity   Alcohol use: No      Alcohol/week: 0.0 standard drinks of alcohol      Comment: rarely beer   Drug use: No   Sexual activity: Not Currently  Other Topics Concern   Not on file  Social History Narrative    Retired International aid/development worker, 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)    Grown children    Never smoker no alcohol tobacco or drug use    Social Drivers of Acupuncturist Strain:  Low Risk  (08/28/2022)    Overall Financial Resource Strain (CARDIA)     Difficulty of Paying Living Expenses: Not hard at all  Food Insecurity: No Food Insecurity (01/10/2023)    Hunger Vital Sign     Worried About Running Out of Food in the Last Year: Never true     Ran Out of Food in the  Last Year: Never true  Transportation Needs: No Transportation Needs (01/10/2023)    PRAPARE - Therapist, art (Medical): No     Lack of Transportation (Non-Medical): No  Physical Activity: Inactive (08/28/2022)    Exercise Vital Sign     Days of Exercise per Week: 0 days     Minutes of Exercise per Session: 0 min  Stress: No Stress Concern Present (08/28/2022)    [employer] of Occupational Health - Occupational Stress Questionnaire     Feeling of Stress : Not at all  Social Connections: Moderately Integrated (08/28/2022)    Social Connection and Isolation Panel [NHANES]     Frequency of Communication with Friends and Family: More than three times a week     Frequency of Social Gatherings with Friends and Family: More than three times a week     Attends Religious Services: Never     Database administrator or Organizations: Yes     Attends Engineer, structural: More than 4 times per year     Marital Status: Married      Current Medications:  Current Medications    Current Facility-Administered Medications:    acetaminophen (TYLENOL) tablet 650 mg, 650 mg, Oral, Q4H PRN, Raekwan Spelman D, NP   enoxaparin (LOVENOX) injection 40 mg, 40 mg, Subcutaneous, Q24H, Sehaj Mcenroe D, NP   ondansetron (ZOFRAN) tablet 4 mg, 4 mg, Oral, Q6H PRN **OR** ondansetron (ZOFRAN) injection 4 mg, 4 mg, Intravenous, Q6H PRN, Vianny Schraeder D, NP   oxyCODONE (Oxy IR/ROXICODONE) immediate release tablet 5 mg, 5 mg, Oral, Q4H PRN, Cereniti Curb D, NP   traMADol (ULTRAM) tablet 50 mg, 50 mg, Oral, Q6H PRN, Jed Limerick, Wilmer Santillo D, NP     Review of Symptoms: Pertinent positives as per interval   Physical Exam (performed with Dr. Pricilla Holm): Alert, oriented, in no acute distress. Breathing unlabored. Ostomy appliance removed with challenges getting enlarged stoma through the cut opening on the wafer. Liquid brown stool in the bag. See media for photo of stoma. Stoma is red,  edematous with prolapse with an excoriation/pressure changes on the area closest to the skin level. There is mild serosanguinous liquid leaking around stoma with palpation. Dr. Pricilla Holm performed digital examination of the stoma with the opening patent.  No lower extremity edema noted.    Laboratory & Radiologic Studies: CBC Labs (Brief)          Component Value Date/Time    WBC 4.9 04/26/2023 1043    WBC 4.2 04/22/2023 1346    RBC 3.61 (L) 04/26/2023 1043    HGB 11.0 (L) 04/26/2023 1043    HCT 33.2 (L) 04/26/2023 1043    PLT 199 04/26/2023 1043    MCV 92.0 04/26/2023 1043    MCH 30.5 04/26/2023 1043    MCHC 33.1 04/26/2023 1043    RDW 16.5 (H) 04/26/2023 1043    LYMPHSABS 0.9 04/26/2023 1043    MONOABS 0.7 04/26/2023 1043    EOSABS 0.1 04/26/2023 1043    BASOSABS 0.0 04/26/2023 1043     BMET Labs (Brief)  Component Value Date/Time    NA 138 04/26/2023 1043    K 3.8 04/26/2023 1043    CL 106 04/26/2023 1043    CO2 27 04/26/2023 1043    GLUCOSE 102 (H) 04/26/2023 1043    BUN 12 04/26/2023 1043    CREATININE 0.64 04/26/2023 1043    CALCIUM 9.6 04/26/2023 1043    GFRNONAA >60 04/26/2023 1043        Assessment & Plan: Rachel Carlson is a 78 y.o. woman with Stage IVB primary peritoneal carcinoma who is s/p 4 cycles NACT (C/T), IDS (11/2022 - exlap, BSO, rectosigmoid resection with end ostomy, omentectomy, small bowel resection). Now s/p 4 cycles of adj C/T (last on 03/25/23). Imaging on 2/17 with stable diffuse residual peritoneal thickening without nodularity. Moderate loculated left pleural effusion, trace right pleural effusion. She has been started on Zejula under the care of Dr. Ellin Saba at [facility].    She presented to the office today for issues with her colostomy stoma with edema and prolapse noted on examination. Recommendation for observation as an inpatient in order for ostomy consultation and general surgery consultation for recommendations. Patient  seen today with Dr. Pricilla Holm. CBC and Cmet obtained and results reviewed with patient. Dr. Pricilla Holm spoke with Dr. Luisa Hart with [facility] who will see the patient today as inpatient. Ostomy RN is also aware.     Of note, in regards to possibility of ostomy reversal in the future, Dr. Pricilla Holm spoke with a colorectal specialist prior to admission who, in the setting of her imaging, recommended at least 2 years of disease monitoring before consideration of surgery to reverse her ostomy.    20 minutes of total time was spent for this patient encounter, including preparation, counseling with the patient and coordination of care, and documentation of the encounter.   Warner Mccreedy NP [facility] GYN Oncology

## 2023-04-26 NOTE — Consult Note (Signed)
 Reason for Consult: Colostomy prolapse Referring Physician: Pricilla Holm MD  Rachel Carlson is an 78 y.o. female.  HPI: Pleasant 78 year old female status post diverting colostomy November 2024 due to primary peritoneal carcinoma with Dr. Pricilla Holm.  She was seen today by her cardiologist who sent her to the hospital due to colostomy issues.  She has had some prolapse of her colostomy.  It is still functioning.  She was seen by the wound ostomy care nurse and we were asked to consult on her colostomy for evaluation due to prolapse.  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)    Esophageal stricture    Family history of breast cancer    Family history of colon cancer    Family history of melanoma    Family history of prostate cancer    Fibroids 1998   uterine, history of (left ovaries)   GERD (gastroesophageal reflux disease)    History of hiatal hernia    History of uterine prolapse    Hx of adenomatous polyp of colon 10/19/2014   Hyperlipidemia    Hypertension    borderline no meds diet and exercise   Neuromuscular disorder (HCC)    neuropathy from chemo   Obesity    Ovarian cancer (HCC)    PVC (premature ventricular contraction)    first dx by holter in 1980's, echo (6/10) EF 55-60%, normal diastolic fxn, normal size RV and fxn, mild MR, PASP   Squamous cell carcinoma of arm    ovarian   Tubal pregnancy    Ulcerative proctosigmoiditis (HCC)    Urge urinary incontinence 06/03/2007   Qualifier: Diagnosis of  By: Lindwood Qua CMA, Jerl Santos      Past Surgical History:  Procedure Laterality Date   BILATERAL SALPINGECTOMY Bilateral 12/12/2022   Procedure: OPEN BILATERAL SALPINGECTOMY-OOPHORECTOMY;  Surgeon: Carver Fila, MD;  Location: [facility];  Service: Gynecology;  Laterality: Bilateral;   BLADDER NECK SUSPENSION  1998   BREAST BIOPSY Left     BREAST LUMPECTOMY Left    benign   CATARACT EXTRACTION Right 01/2020   CATARACT EXTRACTION W/PHACO Left 08/12/2020   Procedure: CATARACT EXTRACTION PHACO AND INTRAOCULAR LENS PLACEMENT LEFT EYE;  Surgeon: Fabio Pierce, MD;  Location: [facility];  Service: Ophthalmology;  Laterality: Left;  left CDE=10.71   COLONOSCOPY  05/18/2011/10/12/14   Dr. Stan Head   DEBULKING N/A 12/12/2022   Procedure: RADICAL TUMOR DEBULKING, PERITONEAL STRIPPING, MOBILIZATION SPLENIC FLEXURE, RECTOSIGMOID RESECTION,SMALL BOWEL RESECTION AND REANASTOMOSIS ,END TO END DESCENDING COLOSTOMY;  Surgeon: Carver Fila, MD;  Location: [facility];  Service: Gynecology;  Laterality: N/A;   ECTOPIC PREGNANCY SURGERY  1980   ESOPHAGOGASTRODUODENOSCOPY (EGD) WITH ESOPHAGEAL DILATION  04/03/2012   Dr. Stan Head   INCISION AND DRAINAGE ABSCESS N/A 01/02/2023   Procedure: INCISION AND DRAINAGE OF ABDOMINAL WOUND SEROMA;  Surgeon: Antionette Char, MD;  Location: [facility];  Service: Gynecology;  Laterality: N/A;   IR IMAGING GUIDED PORT INSERTION  09/06/2022   OMENTECTOMY N/A 12/12/2022   Procedure: OMENTECTOMY;  Surgeon: Carver Fila, MD;  [facility];  Service: Gynecology;  Laterality: N/A;   PARTIAL HYSTERECTOMY     abdominal, fibroids, and prolapse (1998) bladder tack   SALPINGOOPHORECTOMY Bilateral 12/12/2022   Procedure: DIAGNOSTIC LAPAROSCOPY;  Surgeon: Carver Fila, MD;  [facility];  Service: Gynecology;  Laterality:  Bilateral;   SKIN CANCER EXCISION     UPPER GASTROINTESTINAL ENDOSCOPY      Family History  Problem Relation Age of Onset   Heart attack Mother 90   Transient ischemic attack Mother    Diabetes type II Mother    Sudden death Mother        died age 76   Diabetes Mother    Coronary artery disease Mother    Skin cancer Mother    Barrett's esophagus Mother    Sudden death Father 84       "coronary arteriosclerosis" on death certificate   Breast cancer Sister        dx in  her 9s   Colon polyps Sister        adenomatous   Arrhythmia Brother    Colon cancer Brother 32   Congestive Heart Failure Maternal Grandmother    Prostate cancer Maternal Grandfather        dx in 64s   Tuberculosis Paternal Grandfather    Ehlers-Danlos syndrome Daughter    Colon cancer Son 65       lynch syndrome   Other Son        parotid gland tumor   Skin cancer Maternal Aunt    Barrett's esophagus Maternal Aunt    Melanoma Maternal Uncle    Colon cancer Cousin        mid 68s; maternal cousin   Colon cancer Other        MGMs brother   Esophageal cancer Neg Hx    Stomach cancer Neg Hx    Rectal cancer Neg Hx    Uterine cancer Neg Hx    Bladder Cancer Neg Hx     Social History:  reports that she has never smoked. She has never used smokeless tobacco. She reports that she does not drink alcohol and does not use drugs.  Allergies:  Allergies  Allergen Reactions   Aspartame     Intolerance to artifical sweeteners, pt states caused burning of bladder   Levaquin [Levofloxacin] Nausea Only   Pantoprazole Sodium Diarrhea    Abdominal pain   Ranitidine Diarrhea    abdominal pain   Omeprazole Diarrhea    Abdominal pain  Other Reaction(s): Not available   Sulfasalazine Rash and Other (See Comments)    Fever, chills, headache, muscle pain also    Medications: I have reviewed the patient's current medications.  Results for orders placed or performed in visit on 04/26/23 (from the past 48 hours)  CMP (Cancer Center only)     Status: Abnormal   Collection Time: 04/26/23 10:43 AM  Result Value Ref Range   Sodium 138 135 - 145 mmol/L   Potassium 3.8 3.5 - 5.1 mmol/L   Chloride 106 98 - 111 mmol/L   CO2 27 22 - 32 mmol/L   Glucose, Bld 102 (H) 70 - 99 mg/dL    Comment: Glucose reference range applies only to samples taken after fasting for at least 8 hours.   BUN 12 8 - 23 mg/dL   Creatinine 2.13 0.86 - 1.00 mg/dL   Calcium 9.6 8.9 - 57.8 mg/dL   Total Protein 7.0 6.5  - 8.1 g/dL   Albumin 4.1 3.5 - 5.0 g/dL   AST 11 (L) 15 - 41 U/L   ALT 7 0 - 44 U/L   Alkaline Phosphatase 71 38 - 126 U/L   Total Bilirubin 0.7 0.0 - 1.2 mg/dL   GFR, Estimated >46 >96 mL/min  Comment: (NOTE) Calculated using the CKD-EPI Creatinine Equation (2021)    Anion gap 5 5 - 15    Comment: Performed at [facility], [address], [place], [place] [address]  CBC with Differential (Cancer Center Only)     Status: Abnormal   Collection Time: 04/26/23 10:43 AM  Result Value Ref Range   WBC Count 4.9 4.0 - 10.5 K/uL   RBC 3.61 (L) 3.87 - 5.11 MIL/uL   Hemoglobin 11.0 (L) 12.0 - 15.0 g/dL   HCT 19.1 (L) 47.8 - 29.5 %   MCV 92.0 80.0 - 100.0 fL   MCH 30.5 26.0 - 34.0 pg   MCHC 33.1 30.0 - 36.0 g/dL   RDW 62.1 (H) 30.8 - 65.7 %   Platelet Count 199 150 - 400 K/uL   nRBC 0.0 0.0 - 0.2 %   Neutrophils Relative % 66 %   Neutro Abs 3.2 1.7 - 7.7 K/uL   Lymphocytes Relative 17 %   Lymphs Abs 0.9 0.7 - 4.0 K/uL   Monocytes Relative 15 %   Monocytes Absolute 0.7 0.1 - 1.0 K/uL   Eosinophils Relative 1 %   Eosinophils Absolute 0.1 0.0 - 0.5 K/uL   Basophils Relative 1 %   Basophils Absolute 0.0 0.0 - 0.1 K/uL   Immature Granulocytes 0 %   Abs Immature Granulocytes 0.01 0.00 - 0.07 K/uL    Comment: Performed at [facility], [address], [place], [place] [address]    No results found.  Review of Systems  Constitutional:  Positive for fatigue.  All other systems reviewed and are negative.  Blood pressure (!) 140/74, pulse 84, temperature (!) 97.3 F (36.3 C), temperature source Oral, resp. rate 18, last menstrual period 05/29/1996, SpO2 100%. Physical Exam Abdominal:       Comments: Left lower quadrant colostomy is edematous but pink and viable.  Area of pressure necrosis noted and prolapse of about 6 cm noted.  It is soft and does reduce there.  It is not stenotic.     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  This is probably from the wafer being cut too tight.  She has seen the wound ostomy care nurse.  It does reduce.  Unfortunately, she is not a candidate for any reversal anytime soon due to her underlying malignancy.  No urgent care of this necessary.  Recommend ice and manual reduction when she is lying supine.  Will make referral to see a colorectal surgeon for evaluation of any potential revision if this becomes a more recurrent and disabling problem.  Arieon Scalzo A Valma Rotenberg 04/26/2023, 12:45 PM   .straight forward

## 2023-04-26 NOTE — Progress Notes (Signed)
 Briefly, patient was seen by me late this afternoon.  She is overall doing well.  Has been icing her stoma for the last couple of hours.  On exam, the stoma is less edematous, there is sweat in the bag.  Stoma is still pink and viable other than small area along the superior aspect where she has some necrotic appearing tissue of the mucosa.  Discussed continuing to ice intermittently.  Plan for observation with goal of discharge either tomorrow afternoon or Sunday.  Eugene Garnet MD Gynecologic Oncology

## 2023-04-26 NOTE — H&P (Signed)
 Gynecologic Oncology H&P  Reason for Visit: evaluation of prolapsing ostomy stoma  Treatment History: Oncology History Overview Note  CARIS: FOLR1 2+ (76%) ER(2+) HRD-, PDL1 CPS 5%, p53 mutation No mutation in BRCA1/2, ATM, ERBB2, KRAS PR negative   Carcinomatosis (HCC)  08/31/2022 Initial Diagnosis   Carcinomatosis (HCC)   09/12/2022 - 03/25/2023 Chemotherapy   Patient is on Treatment Plan : OVARIAN Carboplatin (AUC 6) + Paclitaxel (175) q21d X 6 Cycles     Gynecologic malignancy (HCC)  12/12/2022 Initial Diagnosis   Gynecologic malignancy (HCC)   02/19/2023 Genetic Testing   Negative genetic testing on the Multi-cancer + RNA panel.  The report date is February 19, 2023.  The Multi-Cancer + RNA Panel offered by [facility] includes sequencing and/or deletion/duplication analysis of the following 70 genes:  AIP*, ALK, APC*, ATM*, AXIN2*, BAP1*, BARD1*, BLM*, BMPR1A*, BRCA1*, BRCA2*, BRIP1*, CDC73*, CDH1*, CDK4, CDKN1B*, CDKN2A, CHEK2*, CTNNA1*, DICER1*, EPCAM (del/dup only), EGFR, FH*, FLCN*, GREM1 (promoter dup only), HOXB13, KIT, LZTR1, MAX*, MBD4, MEN1*, MET, MITF, MLH1*, MSH2*, MSH3*, MSH6*, MUTYH*, NF1*, NF2*, NTHL1*, PALB2*, PDGFRA, PMS2*, POLD1*, POLE*, POT1*, PRKAR1A*, PTCH1*, PTEN*, RAD51C*, RAD51D*, RB1*, RET, SDHA* (sequencing only), SDHAF2*, SDHB*, SDHC*, SDHD*, SMAD4*, SMARCA4*, SMARCB1*, SMARCE1*, STK11*, SUFU*, TMEM127*, TP53*, TSC1*, TSC2*, VHL*. RNA analysis is performed for * genes.   Negative genetic testing on the common hereditary cancer panel.  The Hereditary Gene Panel offered by [facility] includes sequencing and/or deletion duplication testing of the following 47 genes: APC, ATM, AXIN2, BARD1, BMPR1A, BRCA1, BRCA2, BRIP1, CDH1, CDK4, CDKN2A (p14ARF), CDKN2A (p16INK4a), CHEK2, CTNNA1, DICER1, EPCAM (Deletion/duplication testing only), GREM1 (promoter region deletion/duplication testing only), KIT, MEN1, MLH1, MSH2, MSH3, MSH6, MUTYH, NBN, NF1, NHTL1, PALB2, PDGFRA, PMS2,  POLD1, POLE, PTEN, RAD50, RAD51C, RAD51D, SDHB, SDHC, SDHD, SMAD4, SMARCA4. STK11, TP53, TSC1, TSC2, and VHL.  The following genes were evaluated for sequence changes only: SDHA and HOXB13 c.251G>A variant only. The report date is September 25, 2017.    On 12/12/2022, she underwent diagnostic laparoscopy, conversion to exploratory laparotomy with radical tumor debulking including total omentectomy, resection of peritoneal disease, small bowel resection with reanastomosis, en bloc resection of the left fallopian tube and ovary as well as rectosigmoid colon, left ureterolysis, right salpingo-oophorectomy, end descending colostomy, cystoscopy with Dr. Eugene Garnet for primary peritoneal high grade serous carcinoma. Her post-operative course was complicated with an incisional infection with the patient ultimately having the undergo incision and drainage of the post-op wound on 01/02/2023.   Since that time, she has been receiving chemotherapy at [facility]. She has had a CT scan on 03/18/23 returning with no significant change in diffuse, residual peritoneal thickening and moderate left pleural effusion. She underwent two thoracentesis on 03/06/2023 and 03/29/2023. She last received cycle 8 of carboplatin and taxol on 03/25/2023 under the care of Dr. Ellin Saba at [facility].   Interval History: She went for follow up with her cardiologist, Dr. Antoine Poche this am. Our office received a call with concern for possible necrosis and edema of the stoma. An appointment was made for urgent evaluation. She reports feeling overall well. She has been tolerating her diet and has maintained her weight. Appetite has increased since we saw her last. Reporting intermittent fatigue. No bladder issues voiced. Stool consistency from ostomy has been similar to "pancake batter" but for the past two days, she has had liquid watery stool. Her stoma has become significantly edematous. She has been seen at the ostomy clinic in the past. She  has had progressive prolapse with the  edema worsening more recently. No significant bleeding reported except for mild spotting from granulomas intermittently. No pain reported except for discomfort at the stoma. She has struggled with intermittent constipation and recently had to digitally remove hard stool. No fever or chills reported. She has overall been tolerating Zejula without significant side effects.   Past Medical/Surgical History: 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)    Esophageal stricture    Family history of breast cancer    Family history of colon cancer    Family history of melanoma    Family history of prostate cancer    Fibroids 1998   uterine, history of (left ovaries)   GERD (gastroesophageal reflux disease)    History of hiatal hernia    History of uterine prolapse    Hx of adenomatous polyp of colon 10/19/2014   Hyperlipidemia    Hypertension    borderline no meds diet and exercise   Neuromuscular disorder (HCC)    neuropathy from chemo   Obesity    Ovarian cancer (HCC)    PVC (premature ventricular contraction)    first dx by holter in 1980's, echo (6/10) EF 55-60%, normal diastolic fxn, normal size RV and fxn, mild MR, PASP   Squamous cell carcinoma of arm    ovarian   Tubal pregnancy    Ulcerative proctosigmoiditis (HCC)    Urge urinary incontinence 06/03/2007   Qualifier: Diagnosis of  By: Lindwood Qua CMA, Jerl Santos      Past Surgical History:  Procedure Laterality Date   BILATERAL SALPINGECTOMY Bilateral 12/12/2022   Procedure: OPEN BILATERAL SALPINGECTOMY-OOPHORECTOMY;  Surgeon: Carver Fila, MD;  Location: [facility];  Service: Gynecology;  Laterality: Bilateral;   BLADDER NECK SUSPENSION  1998   BREAST BIOPSY Left    BREAST LUMPECTOMY Left    benign   CATARACT EXTRACTION Right 01/2020   CATARACT  EXTRACTION W/PHACO Left 08/12/2020   Procedure: CATARACT EXTRACTION PHACO AND INTRAOCULAR LENS PLACEMENT LEFT EYE;  Surgeon: Fabio Pierce, MD;  Location: [facility];  Service: Ophthalmology;  Laterality: Left;  left CDE=10.71   COLONOSCOPY  05/18/2011/10/12/14   Dr. Stan Head   DEBULKING N/A 12/12/2022   Procedure: RADICAL TUMOR DEBULKING, PERITONEAL STRIPPING, MOBILIZATION SPLENIC FLEXURE, RECTOSIGMOID RESECTION,SMALL BOWEL RESECTION AND REANASTOMOSIS ,END TO END DESCENDING COLOSTOMY;  Surgeon: Carver Fila, MD;  Location: [facility];  Service: Gynecology;  Laterality: N/A;   ECTOPIC PREGNANCY SURGERY  1980   ESOPHAGOGASTRODUODENOSCOPY (EGD) WITH ESOPHAGEAL DILATION  04/03/2012   Dr. Stan Head   INCISION AND DRAINAGE ABSCESS N/A 01/02/2023   Procedure: INCISION AND DRAINAGE OF ABDOMINAL WOUND SEROMA;  Surgeon: Antionette Char, MD;  Location: [facility];  Service: Gynecology;  Laterality: N/A;   IR IMAGING GUIDED PORT INSERTION  09/06/2022   OMENTECTOMY N/A 12/12/2022   Procedure: OMENTECTOMY;  Surgeon: Carver Fila, MD;  Location: [facility];  Service: Gynecology;  Laterality: N/A;   PARTIAL HYSTERECTOMY     abdominal, fibroids, and prolapse (1998) bladder tack   SALPINGOOPHORECTOMY Bilateral 12/12/2022   Procedure: DIAGNOSTIC LAPAROSCOPY;  Surgeon: Carver Fila, MD;  Location: [facility];  Service: Gynecology;  Laterality: Bilateral;   SKIN CANCER EXCISION     UPPER GASTROINTESTINAL ENDOSCOPY      Family History  Problem Relation Age of Onset   Heart attack Mother 51   Transient ischemic  attack Mother    Diabetes type II Mother    Sudden death Mother        died age 91   Diabetes Mother    Coronary artery disease Mother    Skin cancer Mother    Barrett's esophagus Mother    Sudden death Father 65       "coronary arteriosclerosis" on death certificate   Breast cancer Sister        dx in her 86s   Colon polyps Sister        adenomatous   Arrhythmia Brother    Colon  cancer Brother 62   Congestive Heart Failure Maternal Grandmother    Prostate cancer Maternal Grandfather        dx in 54s   Tuberculosis Paternal Grandfather    Ehlers-Danlos syndrome Daughter    Colon cancer Son 1       lynch syndrome   Other Son        parotid gland tumor   Skin cancer Maternal Aunt    Barrett's esophagus Maternal Aunt    Melanoma Maternal Uncle    Colon cancer Cousin        mid 22s; maternal cousin   Colon cancer Other        MGMs brother   Esophageal cancer Neg Hx    Stomach cancer Neg Hx    Rectal cancer Neg Hx    Uterine cancer Neg Hx    Bladder Cancer Neg Hx     Social History   Socioeconomic History   Marital status: Married    Spouse name: Not on file   Number of children: 3   Years of education: Not on file   Highest education level: Not on file  Occupational History   Occupation: International aid/development worker    Comment: retired 2009    Employer: unemployed  Tobacco Use   Smoking status: Never   Smokeless tobacco: Never  Vaping Use   Vaping status: Never Used  Substance and Sexual Activity   Alcohol use: No    Alcohol/week: 0.0 standard drinks of alcohol    Comment: rarely beer   Drug use: No   Sexual activity: Not Currently  Other Topics Concern   Not on file  Social History Narrative   Retired International aid/development worker, 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)   Grown children   Never smoker no alcohol tobacco or drug use   Social Drivers of Corporate investment banker Strain: Low Risk  (08/28/2022)   Overall Financial Resource Strain (CARDIA)    Difficulty of Paying Living Expenses: Not hard at all  Food Insecurity: No Food Insecurity (01/10/2023)   Hunger Vital Sign    Worried About Running Out of Food in the Last Year: Never true    Ran Out of Food in the Last Year: Never true  Transportation Needs: No Transportation Needs (01/10/2023)   PRAPARE - Administrator, Civil Service (Medical): No    Lack of Transportation (Non-Medical): No   Physical Activity: Inactive (08/28/2022)   Exercise Vital Sign    Days of Exercise per Week: 0 days    Minutes of Exercise per Session: 0 min  Stress: No Stress Concern Present (08/28/2022)   [employer] of Occupational Health - Occupational Stress Questionnaire    Feeling of Stress : Not at all  Social Connections: Moderately Integrated (08/28/2022)   Social Connection and Isolation Panel [NHANES]    Frequency of Communication with Friends and Family:  More than three times a week    Frequency of Social Gatherings with Friends and Family: More than three times a week    Attends Religious Services: Never    Database administrator or Organizations: Yes    Attends Engineer, structural: More than 4 times per year    Marital Status: Married    Current Medications:  Current Facility-Administered Medications:    acetaminophen (TYLENOL) tablet 650 mg, 650 mg, Oral, Q4H PRN, Lakeasha Petion D, NP   enoxaparin (LOVENOX) injection 40 mg, 40 mg, Subcutaneous, Q24H, Mikaeel Petrow D, NP   ondansetron (ZOFRAN) tablet 4 mg, 4 mg, Oral, Q6H PRN **OR** ondansetron (ZOFRAN) injection 4 mg, 4 mg, Intravenous, Q6H PRN, Crews Mccollam D, NP   oxyCODONE (Oxy IR/ROXICODONE) immediate release tablet 5 mg, 5 mg, Oral, Q4H PRN, Landon Truax D, NP   traMADol (ULTRAM) tablet 50 mg, 50 mg, Oral, Q6H PRN, Jed Limerick, Maicy Filip D, NP  Review of Symptoms: Pertinent positives as per interval  Physical Exam (performed with Dr. Pricilla Holm): Alert, oriented, in no acute distress. Breathing unlabored. Ostomy appliance removed with challenges getting enlarged stoma through the cut opening on the wafer. Liquid brown stool in the bag. See media for photo of stoma. Stoma is red, edematous with prolapse with an excoriation/pressure changes on the area closest to the skin level. There is mild serosanguinous liquid leaking around stoma with palpation. Dr. Pricilla Holm performed digital examination of the stoma with the opening  patent.  No lower extremity edema noted.   Laboratory & Radiologic Studies: CBC    Component Value Date/Time   WBC 4.9 04/26/2023 1043   WBC 4.2 04/22/2023 1346   RBC 3.61 (L) 04/26/2023 1043   HGB 11.0 (L) 04/26/2023 1043   HCT 33.2 (L) 04/26/2023 1043   PLT 199 04/26/2023 1043   MCV 92.0 04/26/2023 1043   MCH 30.5 04/26/2023 1043   MCHC 33.1 04/26/2023 1043   RDW 16.5 (H) 04/26/2023 1043   LYMPHSABS 0.9 04/26/2023 1043   MONOABS 0.7 04/26/2023 1043   EOSABS 0.1 04/26/2023 1043   BASOSABS 0.0 04/26/2023 1043   BMET    Component Value Date/Time   NA 138 04/26/2023 1043   K 3.8 04/26/2023 1043   CL 106 04/26/2023 1043   CO2 27 04/26/2023 1043   GLUCOSE 102 (H) 04/26/2023 1043   BUN 12 04/26/2023 1043   CREATININE 0.64 04/26/2023 1043   CALCIUM 9.6 04/26/2023 1043   GFRNONAA >60 04/26/2023 1043     Assessment & Plan: Rachel Carlson is a 78 y.o. woman with Stage IVB primary peritoneal carcinoma who is s/p 4 cycles NACT (C/T), IDS (11/2022 - exlap, BSO, rectosigmoid resection with end ostomy, omentectomy, small bowel resection). Now s/p 4 cycles of adj C/T (last on 03/25/23). Imaging on 2/17 with stable diffuse residual peritoneal thickening without nodularity. Moderate loculated left pleural effusion, trace right pleural effusion. She has been started on Zejula under the care of Dr. Ellin Saba at [facility].   She presented to the office today for issues with her colostomy stoma with edema and prolapse noted on examination. Recommendation for observation as an inpatient in order for ostomy consultation and general surgery consultation for recommendations. Patient seen today with Dr. Pricilla Holm. CBC and Cmet obtained and results reviewed with patient. Dr. Pricilla Holm spoke with Dr. Luisa Hart with [facility] who will see the patient today as inpatient. Ostomy RN is also aware.    Of note, in regards to possibility of ostomy  reversal in the future, Dr. Pricilla Holm spoke with a  colorectal specialist prior to admission who, in the setting of her imaging, recommended at least 2 years of disease monitoring before consideration of surgery to reverse her ostomy.   20 minutes of total time was spent for this patient encounter, including preparation, counseling with the patient and coordination of care, and documentation of the encounter.  Warner Mccreedy NP [facility] GYN Oncology

## 2023-04-27 ENCOUNTER — Encounter (HOSPITAL_COMMUNITY): Payer: Self-pay | Admitting: Gynecologic Oncology

## 2023-04-27 DIAGNOSIS — Z85828 Personal history of other malignant neoplasm of skin: Secondary | ICD-10-CM | POA: Diagnosis not present

## 2023-04-27 DIAGNOSIS — K9403 Colostomy malfunction: Secondary | ICD-10-CM | POA: Diagnosis not present

## 2023-04-27 DIAGNOSIS — I10 Essential (primary) hypertension: Secondary | ICD-10-CM | POA: Diagnosis not present

## 2023-04-27 DIAGNOSIS — Z86718 Personal history of other venous thrombosis and embolism: Secondary | ICD-10-CM | POA: Diagnosis not present

## 2023-04-27 MED ORDER — HEPARIN SOD (PORK) LOCK FLUSH 100 UNIT/ML IV SOLN
500.0000 [IU] | Freq: Once | INTRAVENOUS | Status: AC
  Administered 2023-04-27: 500 [IU] via INTRAVENOUS
  Filled 2023-04-27: qty 5

## 2023-04-27 NOTE — TOC Initial Note (Signed)
 Transition of Care [facility]) - Initial/Assessment Note    Patient Details  Name: Rachel Carlson MRN: [id number] Date of Birth: 1945-09-08  Transition of Care [facility]) CM/SW Contact:    Adrian Prows, RN Phone Number: 04/27/2023, 1:55 PM  Clinical Narrative:                 Pt gone from room; contacted her at [phone]; pt verified name, DOB, and address; pt says she lives at home w/ her husband; she plans to return at d/c; pt's son Caryn Bee is POC; she verified insurance/PCP; pt denied SDOH risks; pt says she has a walker, and bars in her shower; she does not have HH services or home oxygen; pt requests MOON will be mail to address on file: [address] [place], [place] [address]; no TOC needs  Expected Discharge Plan: Home/Self Care Barriers to Discharge: No Barriers Identified   Patient Goals and CMS Choice Patient states their goals for this hospitalization and ongoing recovery are:: home CMS Medicare.gov Compare Post Acute Care list provided to:: Patient        Expected Discharge Plan and Services   Discharge Planning Services: CM Consult   Living arrangements for the past 2 months: Single Family Home Expected Discharge Date: 04/27/23               DME Arranged: N/A DME Agency: NA       HH Arranged: NA HH Agency: NA        Prior Living Arrangements/Services Living arrangements for the past 2 months: Single Family Home Lives with:: Spouse Patient language and need for interpreter reviewed:: Yes Do you feel safe going back to the place where you live?: Yes      Need for Family Participation in Patient Care: Yes (Comment) Care giver support system in place?: Yes (comment) Current home services: DME (walker) Criminal Activity/Legal Involvement Pertinent to Current Situation/Hospitalization: No - Comment as needed  Activities of Daily Living   ADL Screening (condition at time of admission) Independently performs ADLs?: Yes (appropriate for  developmental age) Is the patient deaf or have difficulty hearing?: No Does the patient have difficulty seeing, even when wearing glasses/contacts?: No Does the patient have difficulty concentrating, remembering, or making decisions?: No  Permission Sought/Granted Permission sought to share information with : Case Manager Permission granted to share information with : Yes, Verbal Permission Granted  Share Information with NAME: Case Manager     Permission granted to share info w Relationship: Cyanne Delmar (son) [phone]     Emotional Assessment Appearance:: Other (Comment Required (unable to assess) Attitude/Demeanor/Rapport: Gracious Affect (typically observed): Accepting Orientation: : Oriented to Self, Oriented to Place, Oriented to  Time, Oriented to Situation Alcohol / Substance Use: Not Applicable Psych Involvement: No (comment)  Admission diagnosis:  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] Patient Active Problem List   Diagnosis Date Noted   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   Colostomy complication (HCC) 04/03/2023   History of colonic polyps 04/03/2023   Colostomy care (HCC) 01/24/2023   Nonhealing nonsurgical wound limited to breakdown of skin 01/24/2023   Irritant contact dermatitis associated with fecal stoma 01/24/2023   Wound infection after surgery 01/02/2023   Postoperative infection 01/01/2023   Erythema of wound 12/26/2022   Gynecologic malignancy (HCC) 12/12/2022   Iron deficiency anemia 10/25/2022   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   Elevated tumor markers 08/31/2022   DVT, lower extremity, distal (HCC) 08/01/2022   Ascites 07/27/2022   Left leg swelling  07/23/2022   Swelling abdomen 07/23/2022   History of vaginal bleeding 08/17/2021   Nonrheumatic mitral valve regurgitation 04/16/2019   Essential hypertension 04/11/2018   PVC's (premature ventricular contractions) 04/11/2018   Class 1 obesity due to excess calories with serious  comorbidity and body mass index (BMI) of 34.0 to 34.9 in adult 12/06/2017   Genetic testing 09/26/2017   Family history of breast cancer    Family history of colon cancer    Family history of prostate cancer    Family history of melanoma    Family history of Lynch syndrome - son 08/04/2017   Prediabetes 07/20/2015   Estrogen deficiency 04/27/2015   GERD (gastroesophageal reflux disease) 03/23/2015   Hx of adenomatous polyp of colon 10/19/2014   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   Pain in joint, pelvic region and thigh 07/24/2014   Need for hepatitis C screening test 07/12/2014   Pelvic pain in female 07/12/2014   Vitamin D deficiency 07/03/2014   Encounter for Medicare annual wellness exam 07/08/2013   Osteoporosis 05/24/2013   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   Detrusor muscle hypertonia 11/15/2010   Adnexal pain 11/15/2010   Urge incontinence 11/15/2010   Family history of coronary artery disease 10/15/2010   Routine general medical examination at a health care facility 06/01/2010   FASCIITIS, PLANTAR 06/06/2009   Hyperlipidemia 06/03/2007   Urge urinary incontinence 06/03/2007   MIGRAINES, HX OF 06/03/2007   PCP:  Judy Pimple, MD Pharmacy:   [facility] [address] [place] - [address] [place] [address] Phone: [phone] Fax: [phone]  [facility] [address] [place] [address] Phone: [phone] Fax: [phone]     Social Drivers of Health (SDOH) Social History: SDOH Screenings   Food Insecurity: No Food Insecurity (04/27/2023)  Housing: Low Risk  (04/27/2023)  Transportation Needs: No Transportation Needs (04/27/2023)  Utilities: Not At Risk (04/27/2023)  Alcohol Screen: Low Risk  (08/28/2022)  Depression (PHQ2-9): Low Risk  (08/28/2022)  Financial Resource Strain: Low Risk  (08/28/2022)  Physical Activity: Inactive (08/28/2022)  Social  Connections: Moderately Integrated (04/26/2023)  Stress: No Stress Concern Present (08/28/2022)  Tobacco Use: Low Risk  (04/27/2023)  Health Literacy: Adequate Health Literacy (08/28/2022)   SDOH Interventions: Food Insecurity Interventions: Intervention Not Indicated, Inpatient TOC Housing Interventions: Intervention Not Indicated, Inpatient TOC Transportation Interventions: Intervention Not Indicated, Inpatient TOC Utilities Interventions: Intervention Not Indicated, Inpatient TOC   Readmission Risk Interventions    01/03/2023    2:59 PM  Readmission Risk Prevention Plan  Transportation Screening Complete  PCP or Specialist Appt within 5-7 Days Complete  Home Care Screening Complete  Medication Review (RN CM) Complete

## 2023-04-27 NOTE — Discharge Summary (Signed)
 Physician Discharge Summary  Patient ID: Rachel Carlson MRN: [id number] DOB/AGE: 01-Apr-1945 78 y.o.  Admit date: 04/26/2023 Discharge date: 04/27/2023  Admission Diagnoses:  Discharge Diagnoses:  Principal Problem:   Intestinal stoma prolapse [facility])   Discharged Condition: stable  Hospital Course: The had a consult with General Surgery. Adjustments were made to the bag hardware to avoid further pressure necrosis at the colostomy prolapse per recommendations from the wound/ostomy care team.  Conservative measures included ice packs.    Consults: general surgery and social work  Significant Diagnostic Studies: None  Treatments: analgesia: acetaminophen and oxycodone.   Discharge Exam: Blood pressure (!) 134/57, pulse 83, temperature 98.6 F (37 C), temperature source Oral, resp. rate 14, weight 71.4 kg, last menstrual period 05/29/1996, SpO2 96%. GI: prolapse of the colostomy approximately 1 cm, small necrotic area--mostly pink; surrounding skin w/granulation tissue; brown, watery stool in the bag.  Disposition: Discharge disposition: 01-Home or Self Care       Discharge Instructions     Activity as tolerated - No restrictions   Complete by: As directed    Limit straining, strenuous activity.   Call MD for:  persistant nausea and vomiting   Complete by: As directed    Call MD for:  severe uncontrolled pain   Complete by: As directed    Diet - low sodium heart healthy   Complete by: As directed       Allergies as of 04/27/2023       Reactions   Aspartame    Intolerance to artifical sweeteners, pt states caused burning of bladder   Levaquin [levofloxacin] Nausea Only   Pantoprazole Sodium Diarrhea   Abdominal pain   Ranitidine Diarrhea   abdominal pain   Omeprazole Diarrhea   Abdominal pain Other Reaction(s): Not available   Sulfasalazine Rash, Other (See Comments)   Fever, chills, headache, muscle pain also        Medication List     TAKE these  medications    calcium citrate-vitamin D 315-200 MG-UNIT tablet Commonly known as: CITRACAL+D Take 1 tablet by mouth 2 (two) times daily.   Co Q-10 200 MG Caps Take 200 mg by mouth daily.   CRANBERRY PO Take 650 mg by mouth daily.   cyanocobalamin 1000 MCG tablet Commonly known as: VITAMIN B12 Take 1,000 mcg by mouth daily.   Eliquis 5 MG Tabs tablet Generic drug: apixaban Take 1 tablet by mouth twice daily   FISH OIL TRIPLE STRENGTH PO Take 1 capsule by mouth daily.   GLUCOSAMINE CHONDR COMPLEX PO Take 2 capsules by mouth daily.   GRAPE SEED EXTRACT PO Take 1 capsule by mouth daily.   ketoconazole 2 % cream Commonly known as: NIZORAL Apply 1 Application topically daily. To yeast rash   lidocaine-prilocaine cream Commonly known as: EMLA Apply topically daily.   milk thistle 175 MG tablet Take 175 mg by mouth daily.   mirtazapine 15 MG tablet Commonly known as: REMERON Take 1 tablet (15 mg total) by mouth at bedtime.   Prevacid 24HR 15 MG capsule Generic drug: lansoprazole Take 15 mg by mouth See admin instructions. Take 15 mg by mouth in the morning before any food and an additional 15 mg once a day as needed for unresolved reflux   prochlorperazine 10 MG tablet Commonly known as: COMPAZINE Take 10 mg by mouth every 6 (six) hours as needed for nausea or vomiting.   rosuvastatin 20 MG tablet Commonly known as: CRESTOR Take 1 tablet (20 mg total)  by mouth at bedtime.   SYSTANE OP Place 1 drop into both eyes 4 (four) times daily as needed (for dryness).   temazepam 30 MG capsule Commonly known as: RESTORIL Take 1 capsule (30 mg total) by mouth at bedtime as needed for sleep.   traZODone 50 MG tablet Commonly known as: DESYREL Take 1 tablet (50 mg total) by mouth at bedtime as needed for sleep.   vitamin C 1000 MG tablet Take 1,000 mg by mouth daily.   Vitamin D-3 125 MCG (5000 UT) Tabs Take 5,000 Units by mouth daily.   Zejula 200 MG  tablet Generic drug: niraparib tosylate Take 1 tablet (200 mg total) by mouth daily. May take at bedtime to reduce nausea and vomiting.   zinc gluconate 50 MG tablet Take 50 mg by mouth daily.         Signed: Antionette Char, MD 04/27/2023, 10:45 AM

## 2023-04-27 NOTE — Plan of Care (Signed)

## 2023-04-27 NOTE — Care Management Obs Status (Signed)
 MEDICARE OBSERVATION STATUS NOTIFICATION   Patient Details  Name: Rachel Carlson MRN: [id number] Date of Birth: 1945-04-07   Medicare Observation Status Notification Given:  Yes    Adrian Prows, RN 04/27/2023, 1:52 PM

## 2023-04-29 ENCOUNTER — Ambulatory Visit (HOSPITAL_COMMUNITY)
Admission: RE | Admit: 2023-04-29 | Discharge: 2023-04-29 | Disposition: A | Discharge status: Home / Self Care | Source: Ambulatory Visit | Attending: *Deleted | Admitting: *Deleted

## 2023-04-29 ENCOUNTER — Telehealth: Payer: Self-pay | Admitting: *Deleted

## 2023-04-29 DIAGNOSIS — L24B3 Irritant contact dermatitis related to fecal or urinary stoma or fistula: Secondary | ICD-10-CM | POA: Insufficient documentation

## 2023-04-29 DIAGNOSIS — K9409 Other complications of colostomy: Secondary | ICD-10-CM | POA: Insufficient documentation

## 2023-04-29 DIAGNOSIS — K435 Parastomal hernia without obstruction or  gangrene: Secondary | ICD-10-CM | POA: Diagnosis not present

## 2023-04-29 DIAGNOSIS — K9400 Colostomy complication, unspecified: Secondary | ICD-10-CM | POA: Diagnosis not present

## 2023-04-29 NOTE — Telephone Encounter (Signed)
 Please schedule follow-up either this Friday or next Friday with Rachel Carlson. She needs to keep icing and avoid doing heavy lifting. Please stress importance of good bowel regimen.

## 2023-04-29 NOTE — Telephone Encounter (Signed)
 Spoke with Rachel Carlson spouse Rachel Carlson, and relayed message from Dr. Pricilla Holm that patient needs to keep icing  stoma and avoid doing heavy lifting. Reiterated importance of good bowel regimen. Advised that the office would call back tomorrow. Pt is scheduled for appt. With Warner Mccreedy, NP next Friday, April 11th at 1245. Rachel Carlson verbalized understanding and thanked the office for calling.

## 2023-04-29 NOTE — Telephone Encounter (Signed)
 Spoke with Rachel Carlson who called the office this morning stating she hasn't had a bowel movement from her colostomy in 6 days. Pt states only clear liquid, sero-sanguinous small amounts with no specs of stool at all and today makes day 6.  Pt states she hasn't had any miralax as of today, and didn't take any in the hospital on Friday or Saturday and she was sent home on Saturday b/c she was there for observation of the stoma only. Pt has been eating meals, but states today she is going to stick with liquids only. Pt states her stoma came down in size while at the hospital but it's enlarged again. Pt states the opening is extremely tight.  Advised patient per Warner Mccreedy, NP and Dr. Alvester Morin to sprinkle sugar on the stoma, take miralax twice daily and start taking Senokot-S 2-twice daily with at least 8 oz of water. Pt verbalized understanding.  Pt is also asking about follow up appt. With Dr. Pricilla Holm.?  Advised patient that the office will call tomorrow to check in and also ask provider about follow up. Pt thanked the office.

## 2023-04-29 NOTE — Progress Notes (Signed)
 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)    Esophageal stricture    Family history of breast cancer    Family history of colon cancer    Family history of melanoma    Family history of prostate cancer    Fibroids 1998   uterine, history of (left ovaries)   GERD (gastroesophageal reflux disease)    History of hiatal hernia    History of uterine prolapse    Hx of adenomatous polyp of colon 10/19/2014   Hyperlipidemia    Hypertension    borderline no meds diet and exercise   Neuromuscular disorder (HCC)    neuropathy from chemo   Obesity    Ovarian cancer (HCC)    PVC (premature ventricular contraction)    first dx by holter in 1980's, echo (6/10) EF 55-60%, normal diastolic fxn, normal size RV and fxn, mild MR, PASP   Squamous cell carcinoma of arm    ovarian   Tubal pregnancy    Ulcerative proctosigmoiditis (HCC)    Urge urinary incontinence 06/03/2007   Qualifier: Diagnosis of  By: Lindwood Qua CMA, Jerl Santos     Family History  Problem Relation Age of Onset   Heart attack Mother 6   Transient ischemic attack Mother    Diabetes type II Mother    Sudden death Mother        died age 30   Diabetes Mother    Coronary artery disease Mother    Skin cancer Mother    Barrett's esophagus Mother    Sudden death Father 47       "coronary arteriosclerosis" on death certificate   Breast cancer Sister        dx in her 70s   Colon polyps Sister        adenomatous   Arrhythmia Brother    Colon cancer Brother 39   Congestive Heart Failure Maternal Grandmother    Prostate cancer Maternal Grandfather        dx in 85s   Tuberculosis Paternal Grandfather    Ehlers-Danlos  syndrome Daughter    Colon cancer Son 46       lynch syndrome   Other Son        parotid gland tumor   Skin cancer Maternal Aunt    Barrett's esophagus Maternal Aunt    Melanoma Maternal Uncle    Colon cancer Cousin        mid 20s; maternal cousin   Colon cancer Other        MGMs brother   Esophageal cancer Neg Hx    Stomach cancer Neg Hx    Rectal cancer Neg Hx    Uterine cancer Neg Hx    Bladder Cancer Neg Hx    Allergies  Allergen Reactions   Aspartame     Intolerance to artifical sweeteners, pt states caused burning of bladder   Levaquin [Levofloxacin] Nausea Only   Pantoprazole Sodium Diarrhea    Abdominal pain   Ranitidine Diarrhea    abdominal pain   Omeprazole Diarrhea    Abdominal pain  Other Reaction(s): Not available   Sulfasalazine Rash and Other (See Comments)    Fever, chills, headache, muscle  pain also   Current Outpatient Medications  Medication Sig Dispense Refill Last Dose/Taking   apixaban (ELIQUIS) 5 MG TABS tablet Take 1 tablet by mouth twice daily 180 tablet 0    Ascorbic Acid (VITAMIN C) 1000 MG tablet Take 1,000 mg by mouth daily. (Patient not taking: Reported on 04/26/2023)      calcium citrate-vitamin D (CITRACAL+D) 315-200 MG-UNIT per tablet Take 1 tablet by mouth 2 (two) times daily. (Patient not taking: Reported on 04/26/2023)      Cholecalciferol (VITAMIN D-3) 5000 UNITS TABS Take 5,000 Units by mouth daily. (Patient not taking: Reported on 04/26/2023)      Coenzyme Q10 (CO Q-10) 200 MG CAPS Take 200 mg by mouth daily. (Patient not taking: Reported on 04/26/2023)      CRANBERRY PO Take 650 mg by mouth daily. (Patient not taking: Reported on 04/26/2023)      Glucosamine-Chondroitin (GLUCOSAMINE CHONDR COMPLEX PO) Take 2 capsules by mouth daily. (Patient not taking: Reported on 04/26/2023)      GRAPE SEED EXTRACT PO Take 1 capsule by mouth daily. (Patient not taking: Reported on 04/26/2023)      ketoconazole (NIZORAL) 2 % cream Apply 1 Application  topically daily. To yeast rash 30 g 1    lidocaine-prilocaine (EMLA) cream Apply topically daily.      milk thistle 175 MG tablet Take 175 mg by mouth daily. (Patient not taking: Reported on 04/26/2023)      mirtazapine (REMERON) 15 MG tablet Take 1 tablet (15 mg total) by mouth at bedtime. (Patient not taking: Reported on 04/26/2023) 30 tablet 4    niraparib tosylate (ZEJULA) 200 MG tablet Take 1 tablet (200 mg total) by mouth daily. May take at bedtime to reduce nausea and vomiting. 30 tablet 1    Omega-3 Fatty Acids (FISH OIL TRIPLE STRENGTH PO) Take 1 capsule by mouth daily. (Patient not taking: Reported on 04/26/2023)      Polyethyl Glycol-Propyl Glycol (SYSTANE OP) Place 1 drop into both eyes 4 (four) times daily as needed (for dryness).      PREVACID 24HR 15 MG capsule Take 15 mg by mouth See admin instructions. Take 15 mg by mouth in the morning before any food and an additional 15 mg once a day as needed for unresolved reflux      prochlorperazine (COMPAZINE) 10 MG tablet Take 10 mg by mouth every 6 (six) hours as needed for nausea or vomiting.      rosuvastatin (CRESTOR) 20 MG tablet Take 1 tablet (20 mg total) by mouth at bedtime. 90 tablet 3    temazepam (RESTORIL) 30 MG capsule Take 1 capsule (30 mg total) by mouth at bedtime as needed for sleep. 30 capsule 0    traZODone (DESYREL) 50 MG tablet Take 1 tablet (50 mg total) by mouth at bedtime as needed for sleep. (Patient not taking: Reported on 04/26/2023) 30 tablet 1    vitamin B-12 (CYANOCOBALAMIN) 1000 MCG tablet Take 1,000 mcg by mouth daily. (Patient not taking: Reported on 04/26/2023)      zinc gluconate 50 MG tablet Take 50 mg by mouth daily. (Patient not taking: Reported on 04/26/2023)      No current facility-administered medications for this encounter.   ROS  Review of Systems Vital signs:  BP 132/83 (BP Location: Right Arm)   Pulse 92   Temp 98.2 F (36.8 C) (Oral)   Resp 18   LMP 05/29/1996   SpO2 95%  Exam:  Physical  Exam  Stoma type/location:  LUQ colostomy Stomal assessment/size:  1 3/8" diameter, but prolapsed, extending 1.5 cm today.  Moist and pink productive Peristomal assessment:  *** Treatment options for stomal/peristomal skin: *** Output: *** Ostomy pouching: 1pc./2pc.  Education provided:  ***    Impression/dx  *** Discussion  *** Plan  ***    Visit time: *** minutes.   Mike Gip FNP-BC

## 2023-04-30 NOTE — Telephone Encounter (Signed)
 Spoke with Rachel Carlson this morning. Pt states she is starting to have brown liquid stool from her colostomy. Pt encouraged to stay on her bowel regime of senokot-s twice daily with 8 oz of water and miralax 1 capful twice daily. Pt reports the stoma is still swollen and she has the burning pain around the junction of the intestines (stoma) and more when she is cramping after taking the senokot. Pt has a follow up appt. With Warner Mccreedy, NP next Friday 4/11 at 12:45 and reminded pt to avoid heavy lifting. Pt verbalized understanding and thanked the office.

## 2023-05-02 NOTE — Telephone Encounter (Signed)
 Spoke with Ms. Procida. Pt reports passing pancake batter stool in her ostomy. Pt states sometime it's more watery. Pt states the swelling on her stoma is the same without change. Pt also states that one of the granulomas has increased in size. Pt is trying her best not to do any heaving lifting, but she cares for her husband daily. Pt is taking her miralax twice daily and asked if she continues to have stool results, can she stop taking the senokot and only add it in as needed? Pt advised she can take the senokot as needed but continue with the miralax twice daily.  Reminded pt of her appt. Next Friday, 4/11 and if pt felt like she needed to be seen we can schedule an appt. For tomorrow. Pt states next week is ok, tomorrow she has appts. All day for her husband.   Advised Ms. Slagel that her message would be relayed to providers. Pt thanked the office for calling.

## 2023-05-06 ENCOUNTER — Other Ambulatory Visit: Payer: Self-pay | Admitting: Family Medicine

## 2023-05-06 NOTE — Telephone Encounter (Signed)
 Med was d/c from med list in Oct saying pt hadn't used med in the last 30 days but last refill was on 01/03/22 #20 tabs/ 0 refills   CPE is scheduled for 08/21/23

## 2023-05-06 NOTE — Telephone Encounter (Signed)
 Pt notified of Dr. Royden Purl comments pt said she take the meclizine very rarely but will make sure not to take the compazine when she take the meclizine

## 2023-05-06 NOTE — Telephone Encounter (Signed)
 I will refill meclizine but let her know NOT to mix with compazine (on her med list)  The meds are too similar to take together  Thanks

## 2023-05-07 ENCOUNTER — Telehealth: Payer: Self-pay | Admitting: *Deleted

## 2023-05-07 ENCOUNTER — Ambulatory Visit (HOSPITAL_COMMUNITY)
Admission: RE | Admit: 2023-05-07 | Discharge: 2023-05-07 | Disposition: A | Discharge status: Home / Self Care | Source: Ambulatory Visit | Attending: Nurse Practitioner | Admitting: Nurse Practitioner

## 2023-05-07 DIAGNOSIS — K5130 Ulcerative (chronic) rectosigmoiditis without complications: Secondary | ICD-10-CM | POA: Insufficient documentation

## 2023-05-07 DIAGNOSIS — K9403 Colostomy malfunction: Secondary | ICD-10-CM | POA: Insufficient documentation

## 2023-05-07 DIAGNOSIS — Z433 Encounter for attention to colostomy: Secondary | ICD-10-CM | POA: Diagnosis present

## 2023-05-07 DIAGNOSIS — C482 Malignant neoplasm of peritoneum, unspecified: Secondary | ICD-10-CM | POA: Diagnosis not present

## 2023-05-07 DIAGNOSIS — K9400 Colostomy complication, unspecified: Secondary | ICD-10-CM

## 2023-05-07 NOTE — Progress Notes (Signed)
 [facility]   Reason for visit:  LUQ colostomy with prolapse.  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)    Esophageal stricture    Family history of breast cancer    Family history of colon cancer    Family history of melanoma    Family history of prostate cancer    Fibroids 1998   uterine, history of (left ovaries)   GERD (gastroesophageal reflux disease)    History of hiatal hernia    History of uterine prolapse    Hx of adenomatous polyp of colon 10/19/2014   Hyperlipidemia    Hypertension    borderline no meds diet and exercise   Neuromuscular disorder (HCC)    neuropathy from chemo   Obesity    Ovarian cancer (HCC)    PVC (premature ventricular contraction)    first dx by holter in 1980's, echo (6/10) EF 55-60%, normal diastolic fxn, normal size RV and fxn, mild MR, PASP   Squamous cell carcinoma of arm    ovarian   Tubal pregnancy    Ulcerative proctosigmoiditis (HCC)    Urge urinary incontinence 06/03/2007   Qualifier: Diagnosis of  By: Lennice Quivers CMA, Kelly Patient     Family History  Problem Relation Age of Onset   Heart attack Mother 73   Transient ischemic attack Mother    Diabetes type II Mother    Sudden death Mother        died age 86   Diabetes Mother    Coronary artery disease Mother    Skin cancer Mother    Barrett's esophagus Mother    Sudden death Father 41       "coronary arteriosclerosis" on death certificate   Breast cancer Sister        dx in her 81s   Colon polyps Sister        adenomatous   Arrhythmia Brother    Colon cancer Brother 39   Congestive Heart Failure Maternal Grandmother    Prostate cancer Maternal Grandfather        dx in 55s   Tuberculosis Paternal Grandfather    Ehlers-Danlos syndrome Daughter     Colon cancer Son 76       lynch syndrome   Other Son        parotid gland tumor   Skin cancer Maternal Aunt    Barrett's esophagus Maternal Aunt    Melanoma Maternal Uncle    Colon cancer Cousin        mid 72s; maternal cousin   Colon cancer Other        MGMs brother   Esophageal cancer Neg Hx    Stomach cancer Neg Hx    Rectal cancer Neg Hx    Uterine cancer Neg Hx    Bladder Cancer Neg Hx    Allergies  Allergen Reactions   Aspartame     Intolerance to artifical sweeteners, pt states caused burning of bladder   Levaquin [Levofloxacin] Nausea Only   Pantoprazole Sodium Diarrhea    Abdominal pain   Ranitidine Diarrhea    abdominal pain   Omeprazole Diarrhea    Abdominal pain  Other Reaction(s): Not available   Sulfasalazine Rash and Other (See Comments)    Fever, chills, headache, muscle pain also  Current Outpatient Medications  Medication Sig Dispense Refill Last Dose/Taking   apixaban (ELIQUIS) 5 MG TABS tablet Take 1 tablet by mouth twice daily 180 tablet 0    Ascorbic Acid (VITAMIN C) 1000 MG tablet Take 1,000 mg by mouth daily. (Patient not taking: Reported on 04/26/2023)      calcium citrate-vitamin D (CITRACAL+D) 315-200 MG-UNIT per tablet Take 1 tablet by mouth 2 (two) times daily. (Patient not taking: Reported on 04/26/2023)      Cholecalciferol (VITAMIN D-3) 5000 UNITS TABS Take 5,000 Units by mouth daily. (Patient not taking: Reported on 04/26/2023)      Coenzyme Q10 (CO Q-10) 200 MG CAPS Take 200 mg by mouth daily. (Patient not taking: Reported on 04/26/2023)      CRANBERRY PO Take 650 mg by mouth daily. (Patient not taking: Reported on 04/26/2023)      Glucosamine-Chondroitin (GLUCOSAMINE CHONDR COMPLEX PO) Take 2 capsules by mouth daily. (Patient not taking: Reported on 04/26/2023)      GRAPE SEED EXTRACT PO Take 1 capsule by mouth daily. (Patient not taking: Reported on 04/26/2023)      ketoconazole (NIZORAL) 2 % cream Apply 1 Application topically daily. To yeast  rash 30 g 1    lidocaine-prilocaine (EMLA) cream Apply topically daily.      meclizine (ANTIVERT) 25 MG tablet TAKE 1 TABLET BY MOUTH THREE TIMES DAILY AS NEEDED FOR NAUSEA OR DIZZINESS 20 tablet 0    milk thistle 175 MG tablet Take 175 mg by mouth daily. (Patient not taking: Reported on 04/26/2023)      mirtazapine (REMERON) 15 MG tablet Take 1 tablet (15 mg total) by mouth at bedtime. (Patient not taking: Reported on 04/26/2023) 30 tablet 4    niraparib tosylate (ZEJULA) 200 MG tablet Take 1 tablet (200 mg total) by mouth daily. May take at bedtime to reduce nausea and vomiting. 30 tablet 1    Omega-3 Fatty Acids (FISH OIL TRIPLE STRENGTH PO) Take 1 capsule by mouth daily. (Patient not taking: Reported on 04/26/2023)      Polyethyl Glycol-Propyl Glycol (SYSTANE OP) Place 1 drop into both eyes 4 (four) times daily as needed (for dryness).      PREVACID 24HR 15 MG capsule Take 15 mg by mouth See admin instructions. Take 15 mg by mouth in the morning before any food and an additional 15 mg once a day as needed for unresolved reflux      prochlorperazine (COMPAZINE) 10 MG tablet Take 10 mg by mouth every 6 (six) hours as needed for nausea or vomiting.      rosuvastatin (CRESTOR) 20 MG tablet Take 1 tablet (20 mg total) by mouth at bedtime. 90 tablet 3    temazepam (RESTORIL) 30 MG capsule Take 1 capsule (30 mg total) by mouth at bedtime as needed for sleep. 30 capsule 0    traZODone (DESYREL) 50 MG tablet Take 1 tablet (50 mg total) by mouth at bedtime as needed for sleep. (Patient not taking: Reported on 04/26/2023) 30 tablet 1    vitamin B-12 (CYANOCOBALAMIN) 1000 MCG tablet Take 1,000 mcg by mouth daily. (Patient not taking: Reported on 04/26/2023)      zinc gluconate 50 MG tablet Take 50 mg by mouth daily. (Patient not taking: Reported on 04/26/2023)      No current facility-administered medications for this encounter.   ROS  Review of Systems  Constitutional:  Positive for fatigue.   Gastrointestinal:        Stomal prolapse   Musculoskeletal:  Positive for gait problem.  Psychiatric/Behavioral:  Positive for dysphoric mood.   All other systems reviewed and are negative.  Vital signs:  BP (!) (P) 140/78 (BP Location: Right Arm)   Pulse (P) 98   Temp (P) 97.9 F (36.6 C) (Oral)   Resp (P) 16   LMP 05/29/1996   SpO2 (P) 97%  Exam:  Physical Exam Vitals reviewed.  Constitutional:      Appearance: Normal appearance.  HENT:     Mouth/Throat:     Mouth: Mucous membranes are moist.  Cardiovascular:     Rate and Rhythm: Normal rate and regular rhythm.  Pulmonary:     Effort: Pulmonary effort is normal.     Breath sounds: Normal breath sounds.  Abdominal:     Palpations: Abdomen is soft.     Comments: Bulging around stoma, consistent with hernia  Musculoskeletal:        General: Normal range of motion.  Skin:    General: Skin is warm and dry.     Findings: Erythema present.  Neurological:     General: No focal deficit present.     Mental Status: She is alert and oriented to person, place, and time. Mental status is at baseline.  Psychiatric:        Behavior: Behavior normal.        Thought Content: Thought content normal.     Stoma type/location:  LUQ colostomy Stomal assessment/size:  1 1/2" diameter, prolapsed, protrudes 1.5 cm  Peristomal assessment:  erythema and tenderness Treatment options for stomal/peristomal skin: barrier ring and  Output: soft brown stool  Ostomy pouching: 2 piece 4 inch pouch  with barrier ring and belt.  To allow for expanding size stoma and belt use.  Education provided:  We apply 4 inch pouch  I provide additional pouches for her to try.  We can update supplier if she prefers this.     Impression/dx  Colostomy complication Discussion  Switching to larger, 4 inch pouch.  Plan  See back 2 weeks      Visit time: 45 minutes.   Branda Cain FNP-BC

## 2023-05-07 NOTE — Telephone Encounter (Signed)
Attempted to reach patient. Left voicemail requesting call back.

## 2023-05-08 DIAGNOSIS — K4020 Bilateral inguinal hernia, without obstruction or gangrene, not specified as recurrent: Secondary | ICD-10-CM | POA: Insufficient documentation

## 2023-05-08 NOTE — Discharge Instructions (Signed)
 See back next week to assess stoma and need to change pouching system

## 2023-05-09 ENCOUNTER — Encounter (INDEPENDENT_AMBULATORY_CARE_PROVIDER_SITE_OTHER): Admitting: Ophthalmology

## 2023-05-09 DIAGNOSIS — H35373 Puckering of macula, bilateral: Secondary | ICD-10-CM

## 2023-05-09 DIAGNOSIS — H43813 Vitreous degeneration, bilateral: Secondary | ICD-10-CM

## 2023-05-10 ENCOUNTER — Inpatient Hospital Stay (HOSPITAL_BASED_OUTPATIENT_CLINIC_OR_DEPARTMENT_OTHER): Attending: Hematology | Admitting: Gynecologic Oncology

## 2023-05-10 VITALS — BP 126/71 | HR 91 | Temp 98.9°F | Resp 19 | Wt 158.0 lb

## 2023-05-10 DIAGNOSIS — Z7901 Long term (current) use of anticoagulants: Secondary | ICD-10-CM | POA: Insufficient documentation

## 2023-05-10 DIAGNOSIS — G620 Drug-induced polyneuropathy: Secondary | ICD-10-CM | POA: Insufficient documentation

## 2023-05-10 DIAGNOSIS — C482 Malignant neoplasm of peritoneum, unspecified: Secondary | ICD-10-CM | POA: Diagnosis not present

## 2023-05-10 DIAGNOSIS — G4700 Insomnia, unspecified: Secondary | ICD-10-CM | POA: Insufficient documentation

## 2023-05-10 DIAGNOSIS — Y833 Surgical operation with formation of external stoma as the cause of abnormal reaction of the patient, or of later complication, without mention of misadventure at the time of the procedure: Secondary | ICD-10-CM | POA: Diagnosis not present

## 2023-05-10 DIAGNOSIS — Z79899 Other long term (current) drug therapy: Secondary | ICD-10-CM | POA: Insufficient documentation

## 2023-05-10 DIAGNOSIS — Z86718 Personal history of other venous thrombosis and embolism: Secondary | ICD-10-CM | POA: Diagnosis not present

## 2023-05-10 DIAGNOSIS — Z9221 Personal history of antineoplastic chemotherapy: Secondary | ICD-10-CM | POA: Diagnosis not present

## 2023-05-10 DIAGNOSIS — J90 Pleural effusion, not elsewhere classified: Secondary | ICD-10-CM | POA: Insufficient documentation

## 2023-05-10 NOTE — Patient Instructions (Signed)
 I will reach out to the ostomy team and the general surgeon team to see if they have any ideas of ways to support the stoma itself.   Continue taking the miralax once daily at minimum, twice if needed.   Dr. Pricilla Holm is recommending the consultation with Dr. Cliffton Asters with [facility].   Please call the office for any needs or concerns. Follow up with Dr. Kirtland Bouchard as planned.

## 2023-05-10 NOTE — Progress Notes (Signed)
 Gynecologic Oncology Follow Up Visit  05/10/2023   Reason for Visit: follow up from recent hospitalization for prolapsing ostomy stoma   Treatment History:     Oncology History Overview Note   CARIS: FOLR1 2+ (75%) ER(2+) HRD-, PDL1 CPS 5%, p53 mutation No mutation in BRCA1/2, ATM, ERBB2, KRAS PR negative    Carcinomatosis (HCC)   08/31/2022 Initial Diagnosis     Carcinomatosis (HCC)     09/12/2022 - 03/25/2023 Chemotherapy     Patient is on Treatment Plan : OVARIAN Carboplatin (AUC 6) + Paclitaxel (175) q21d X 6 Cycles      Gynecologic malignancy (HCC)   12/12/2022 Initial Diagnosis     Gynecologic malignancy (HCC)     02/19/2023 Genetic Testing     Negative genetic testing on the Multi-cancer + RNA panel.  The report date is February 19, 2023.   The Multi-Cancer + RNA Panel offered by [facility] includes sequencing and/or deletion/duplication analysis of the following 70 genes:  AIP*, ALK, APC*, ATM*, AXIN2*, BAP1*, BARD1*, BLM*, BMPR1A*, BRCA1*, BRCA2*, BRIP1*, CDC73*, CDH1*, CDK4, CDKN1B*, CDKN2A, CHEK2*, CTNNA1*, DICER1*, EPCAM (del/dup only), EGFR, FH*, FLCN*, GREM1 (promoter dup only), HOXB13, KIT, LZTR1, MAX*, MBD4, MEN1*, MET, MITF, MLH1*, MSH2*, MSH3*, MSH6*, MUTYH*, NF1*, NF2*, NTHL1*, PALB2*, PDGFRA, PMS2*, POLD1*, POLE*, POT1*, PRKAR1A*, PTCH1*, PTEN*, RAD51C*, RAD51D*, RB1*, RET, SDHA* (sequencing only), SDHAF2*, SDHB*, SDHC*, SDHD*, SMAD4*, SMARCA4*, SMARCB1*, SMARCE1*, STK11*, SUFU*, TMEM127*, TP53*, TSC1*, TSC2*, VHL*. RNA analysis is performed for * genes.    Negative genetic testing on the common hereditary cancer panel.  The Hereditary Gene Panel offered by [facility] includes sequencing and/or deletion duplication testing of the following 47 genes: APC, ATM, AXIN2, BARD1, BMPR1A, BRCA1, BRCA2, BRIP1, CDH1, CDK4, CDKN2A (p14ARF), CDKN2A (p16INK4a), CHEK2, CTNNA1, DICER1, EPCAM (Deletion/duplication testing only), GREM1 (promoter region deletion/duplication testing only), KIT,  MEN1, MLH1, MSH2, MSH3, MSH6, MUTYH, NBN, NF1, NHTL1, PALB2, PDGFRA, PMS2, POLD1, POLE, PTEN, RAD50, RAD51C, RAD51D, SDHB, SDHC, SDHD, SMAD4, SMARCA4. STK11, TP53, TSC1, TSC2, and VHL.  The following genes were evaluated for sequence changes only: SDHA and HOXB13 c.251G>A variant only. The report date is September 25, 2017.       On 12/12/2022, she underwent diagnostic laparoscopy, conversion to exploratory laparotomy with radical tumor debulking including total omentectomy, resection of peritoneal disease, small bowel resection with reanastomosis, en bloc resection of the left fallopian tube and ovary as well as rectosigmoid colon, left ureterolysis, right salpingo-oophorectomy, end descending colostomy, cystoscopy with Dr. Wiley Hanger for primary peritoneal high grade serous carcinoma. Her post-operative course was complicated with an incisional infection with the patient ultimately having the undergo incision and drainage of the post-op wound on 01/02/2023.    Since that time, she has been receiving chemotherapy at [facility]. She has had a CT scan on 03/18/23 returning with no significant change in diffuse, residual peritoneal thickening and moderate left pleural effusion. She underwent two thoracentesis on 03/06/2023 and 03/29/2023. She last received cycle 8 of carboplatin and taxol on 03/25/2023 under the care of Dr. Cheree Cords at [facility]. She is currently taking niraparib daily, which was started on 04/22/2023.  She was recently seen in the office on 04/26/2023 with stoma edema and prolapse. She was admitted to the hospital with consultations with general surgery and ostomy RN. At discharge, referral was placed to meet with colorectal surgeon for further evaluation and discussion of any potential revision. This appointment has been arranged for 05/29/2023 at [facility] with Dr. Rickford Charnley.   Interval History: Presents  to the office for hospital follow up. She has been doing overall well at  home. Has had intermittent nausea and has compazine at home as needed. Has been using miralax once or twice daily. She has had appts everyday this week and her ostomy output has been thicker. The stoma continues to protrude, less than last assessment. Edema has decreased significantly. She continues to have scattered granulomas around the stoma. No bleeding from these. Situation is discouraging since stoma with appliance bulge out and can be hard when wearing clothes etc. She has been trying to avoid heavy lifting. She has to bend over to get items and has more challenges with neuropathy. She stays active at home and notices more stoma edema at the end of the day, days with more activity. Bladder functioning without changes. Feels she is tolerating the treatment. Has appt with Dr. Camilo Cella at [facility] on 4/30.   Past Medical/Surgical History:     Past Medical History:  Diagnosis Date   Blood transfusion without reported diagnosis     Chronic right ear pain      normal MRI   DVT of leg (deep venous thrombosis) (HCC)     Esophageal stricture     Family history of breast cancer     Family history of colon cancer     Family history of melanoma     Family history of prostate cancer     Fibroids 1998    uterine, history of (left ovaries)   GERD (gastroesophageal reflux disease)     History of hiatal hernia     History of uterine prolapse     Hx of adenomatous polyp of colon 10/19/2014   Hyperlipidemia     Hypertension      borderline no meds diet and exercise   Neuromuscular disorder (HCC)      neuropathy from chemo   Obesity     Ovarian cancer (HCC)     PVC (premature ventricular contraction)      first dx by holter in 1980's, echo (6/10) EF 55-60%, normal diastolic fxn, normal size RV and fxn, mild MR, PASP   Squamous cell carcinoma of arm      ovarian   Tubal pregnancy     Ulcerative proctosigmoiditis (HCC)     Urge urinary incontinence 06/03/2007    Qualifier: Diagnosis of  By:  Lennice Quivers CMA, Kelly Patient                 Past Surgical History:  Procedure Laterality Date   BILATERAL SALPINGECTOMY Bilateral 12/12/2022    Procedure: OPEN BILATERAL SALPINGECTOMY-OOPHORECTOMY;  Surgeon: Suzi Essex, MD;  Location: [facility];  Service: Gynecology;  Laterality: Bilateral;   BLADDER NECK SUSPENSION   1998   BREAST BIOPSY Left     BREAST LUMPECTOMY Left      benign   CATARACT EXTRACTION Right 01/2020   CATARACT EXTRACTION W/PHACO Left 08/12/2020    Procedure: CATARACT EXTRACTION PHACO AND INTRAOCULAR LENS PLACEMENT LEFT EYE;  Surgeon: Tarri Farm, MD;  Location: [facility];  Service: Ophthalmology;  Laterality: Left;  left CDE=10.71   COLONOSCOPY   05/18/2011/10/12/14    Dr. Loy Ruff   DEBULKING N/A 12/12/2022    Procedure: RADICAL TUMOR DEBULKING, PERITONEAL STRIPPING, MOBILIZATION SPLENIC FLEXURE, RECTOSIGMOID RESECTION,SMALL BOWEL RESECTION AND REANASTOMOSIS ,END TO END DESCENDING COLOSTOMY;  Surgeon: Suzi Essex, MD;  Location: [facility];  Service: Gynecology;  Laterality: N/A;   ECTOPIC PREGNANCY SURGERY   1980   ESOPHAGOGASTRODUODENOSCOPY (EGD) WITH  ESOPHAGEAL DILATION   04/03/2012    Dr. Loy Ruff   INCISION AND DRAINAGE ABSCESS N/A 01/02/2023    Procedure: INCISION AND DRAINAGE OF ABDOMINAL WOUND SEROMA;  Surgeon: Abdul Hodgkin, MD;  Location: [facility];  Service: Gynecology;  Laterality: N/A;   IR IMAGING GUIDED PORT INSERTION   09/06/2022   OMENTECTOMY N/A 12/12/2022    Procedure: OMENTECTOMY;  Surgeon: Suzi Essex, MD;  Location: [facility];  Service: Gynecology;  Laterality: N/A;   PARTIAL HYSTERECTOMY        abdominal, fibroids, and prolapse (1998) bladder tack   SALPINGOOPHORECTOMY Bilateral 12/12/2022    Procedure: DIAGNOSTIC LAPAROSCOPY;  Surgeon: Suzi Essex, MD;  Location: [facility];  Service: Gynecology;  Laterality: Bilateral;   SKIN CANCER EXCISION       UPPER GASTROINTESTINAL ENDOSCOPY                   Family  History  Problem Relation Age of Onset   Heart attack Mother 75   Transient ischemic attack Mother     Diabetes type II Mother     Sudden death Mother          died age 35   Diabetes Mother     Coronary artery disease Mother     Skin cancer Mother     Barrett's esophagus Mother     Sudden death Father 57        "coronary arteriosclerosis" on death certificate   Breast cancer Sister          dx in her 42s   Colon polyps Sister          adenomatous   Arrhythmia Brother     Colon cancer Brother 85   Congestive Heart Failure Maternal Grandmother     Prostate cancer Maternal Grandfather          dx in 34s   Tuberculosis Paternal Grandfather     Ehlers-Danlos syndrome Daughter     Colon cancer Son 19        lynch syndrome   Other Son          parotid gland tumor   Skin cancer Maternal Aunt     Barrett's esophagus Maternal Aunt     Melanoma Maternal Uncle     Colon cancer Cousin          mid 82s; maternal cousin   Colon cancer Other          MGMs brother   Esophageal cancer Neg Hx     Stomach cancer Neg Hx     Rectal cancer Neg Hx     Uterine cancer Neg Hx     Bladder Cancer Neg Hx            Social History         Socioeconomic History   Marital status: Married      Spouse name: Not on file   Number of children: 3   Years of education: Not on file   Highest education level: Not on file  Occupational History   Occupation: International aid/development worker      Comment: retired 2009      Employer: unemployed  Tobacco Use   Smoking status: Never   Smokeless tobacco: Never  Vaping Use   Vaping status: Never Used  Substance and Sexual Activity   Alcohol use: No      Alcohol/week: 0.0 standard drinks of alcohol      Comment: rarely beer   Drug  use: No   Sexual activity: Not Currently  Other Topics Concern   Not on file  Social History Narrative    Retired International aid/development worker, 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)    Grown children    Never smoker no alcohol tobacco or drug use     Social Drivers of Acupuncturist Strain: Low Risk  (08/28/2022)    Overall Financial Resource Strain (CARDIA)     Difficulty of Paying Living Expenses: Not hard at all  Food Insecurity: No Food Insecurity (01/10/2023)    Hunger Vital Sign     Worried About Running Out of Food in the Last Year: Never true     Ran Out of Food in the Last Year: Never true  Transportation Needs: No Transportation Needs (01/10/2023)    PRAPARE - Therapist, art (Medical): No     Lack of Transportation (Non-Medical): No  Physical Activity: Inactive (08/28/2022)    Exercise Vital Sign     Days of Exercise per Week: 0 days     Minutes of Exercise per Session: 0 min  Stress: No Stress Concern Present (08/28/2022)    [employer] of Occupational Health - Occupational Stress Questionnaire     Feeling of Stress : Not at all  Social Connections: Moderately Integrated (08/28/2022)    Social Connection and Isolation Panel [NHANES]     Frequency of Communication with Friends and Family: More than three times a week     Frequency of Social Gatherings with Friends and Family: More than three times a week     Attends Religious Services: Never     Database administrator or Organizations: Yes     Attends Engineer, structural: More than 4 times per year     Marital Status: Married      Current Medications:  Current Medications    Current Facility-Administered Medications:    acetaminophen (TYLENOL) tablet 650 mg, 650 mg, Oral, Q4H PRN, Lorcan Shelp D, NP   enoxaparin (LOVENOX) injection 40 mg, 40 mg, Subcutaneous, Q24H, Sebrena Engh D, NP   ondansetron (ZOFRAN) tablet 4 mg, 4 mg, Oral, Q6H PRN **OR** ondansetron (ZOFRAN) injection 4 mg, 4 mg, Intravenous, Q6H PRN, Terrye Dombrosky D, NP   oxyCODONE (Oxy IR/ROXICODONE) immediate release tablet 5 mg, 5 mg, Oral, Q4H PRN, Hana Trippett D, NP   traMADol (ULTRAM) tablet 50 mg, 50 mg, Oral, Q6H PRN, Georg Killian,  Lugenia Assefa D, NP     Review of Symptoms: Pertinent positives as per interval   Physical Exam (performed with Dr. Orvil Bland): Alert, oriented, in no acute distress. Breathing unlabored. Ostomy appliance intact. Soft brown stool in the bag. Stoma is red/pink throughout (viable), prolapse remains but less than last assessment. Stoma is significantly less edematous, scatter granulomas remain. No excoriations on the stoma. Dr. Orvil Bland to room to assess stoma as well.    No lower extremity edema noted.    Laboratory & Radiologic Studies: CBC Labs (Brief)          Component Value Date/Time    WBC 4.9 04/26/2023 1043    WBC 4.2 04/22/2023 1346    RBC 3.61 (L) 04/26/2023 1043    HGB 11.0 (L) 04/26/2023 1043    HCT 33.2 (L) 04/26/2023 1043    PLT 199 04/26/2023 1043    MCV 92.0 04/26/2023 1043    MCH 30.5 04/26/2023 1043    MCHC 33.1 04/26/2023 1043  RDW 16.5 (H) 04/26/2023 1043    LYMPHSABS 0.9 04/26/2023 1043    MONOABS 0.7 04/26/2023 1043    EOSABS 0.1 04/26/2023 1043    BASOSABS 0.0 04/26/2023 1043     BMET Labs (Brief)          Component Value Date/Time    NA 138 04/26/2023 1043    K 3.8 04/26/2023 1043    CL 106 04/26/2023 1043    CO2 27 04/26/2023 1043    GLUCOSE 102 (H) 04/26/2023 1043    BUN 12 04/26/2023 1043    CREATININE 0.64 04/26/2023 1043    CALCIUM 9.6 04/26/2023 1043    GFRNONAA >60 04/26/2023 1043        Assessment & Plan: Rachel Carlson is a 78 y.o. woman with Stage IVB primary peritoneal carcinoma who is s/p 4 cycles NACT (C/T), IDS (11/2022 - exlap, BSO, rectosigmoid resection with end ostomy, omentectomy, small bowel resection). Now s/p 4 cycles of adj C/T (last on 03/25/23). Imaging on 2/17 with stable diffuse residual peritoneal thickening without nodularity. Moderate loculated left pleural effusion, trace right pleural effusion. She has been started on Zejula under the care of Dr. Cheree Cords at [facility].    Patient seen today along with Dr. Orvil Bland.  Will research any options for stoma support device. Advised to continue with bowel regimen. Plan to continue with current plan for consultation with Dr. Maryland Snow at [facility]. She will continue to follow up with Dr. Linnell Richardson at [facility] and follow up has been arranged in our office for July 2025. She is advised to call the office for any needs or concerns or new symptoms.   Of note, in regards to possibility of ostomy reversal in the future, Dr. Orvil Bland previously spoke with a colorectal specialist who, in the setting of her imaging, recommended at least 2 years of disease monitoring before consideration of surgery to reverse her ostomy.    20 minutes of total time was spent for this patient encounter, including preparation, counseling with the patient and coordination of care, and documentation of the encounter.   Vira Grieves NP [facility] GYN Oncology

## 2023-05-13 ENCOUNTER — Other Ambulatory Visit: Payer: Self-pay | Admitting: Pharmacy Technician

## 2023-05-13 ENCOUNTER — Inpatient Hospital Stay

## 2023-05-13 ENCOUNTER — Other Ambulatory Visit (HOSPITAL_COMMUNITY): Payer: Self-pay

## 2023-05-13 ENCOUNTER — Inpatient Hospital Stay: Admitting: Hematology

## 2023-05-13 ENCOUNTER — Other Ambulatory Visit: Payer: Self-pay

## 2023-05-13 VITALS — BP 147/85 | HR 99 | Temp 97.5°F | Resp 18 | Wt 160.5 lb

## 2023-05-13 DIAGNOSIS — C800 Disseminated malignant neoplasm, unspecified: Secondary | ICD-10-CM

## 2023-05-13 DIAGNOSIS — C786 Secondary malignant neoplasm of retroperitoneum and peritoneum: Secondary | ICD-10-CM | POA: Diagnosis not present

## 2023-05-13 DIAGNOSIS — K449 Diaphragmatic hernia without obstruction or gangrene: Secondary | ICD-10-CM | POA: Diagnosis not present

## 2023-05-13 DIAGNOSIS — E876 Hypokalemia: Secondary | ICD-10-CM | POA: Diagnosis not present

## 2023-05-13 DIAGNOSIS — Z86718 Personal history of other venous thrombosis and embolism: Secondary | ICD-10-CM | POA: Diagnosis not present

## 2023-05-13 DIAGNOSIS — R109 Unspecified abdominal pain: Secondary | ICD-10-CM | POA: Diagnosis not present

## 2023-05-13 DIAGNOSIS — Z79899 Other long term (current) drug therapy: Secondary | ICD-10-CM | POA: Diagnosis not present

## 2023-05-13 DIAGNOSIS — Z7901 Long term (current) use of anticoagulants: Secondary | ICD-10-CM | POA: Diagnosis not present

## 2023-05-13 DIAGNOSIS — D509 Iron deficiency anemia, unspecified: Secondary | ICD-10-CM | POA: Diagnosis not present

## 2023-05-13 DIAGNOSIS — Z95828 Presence of other vascular implants and grafts: Secondary | ICD-10-CM

## 2023-05-13 DIAGNOSIS — I10 Essential (primary) hypertension: Secondary | ICD-10-CM | POA: Diagnosis not present

## 2023-05-13 DIAGNOSIS — K219 Gastro-esophageal reflux disease without esophagitis: Secondary | ICD-10-CM | POA: Diagnosis not present

## 2023-05-13 DIAGNOSIS — J90 Pleural effusion, not elsewhere classified: Secondary | ICD-10-CM | POA: Diagnosis not present

## 2023-05-13 DIAGNOSIS — Z85831 Personal history of malignant neoplasm of soft tissue: Secondary | ICD-10-CM | POA: Diagnosis not present

## 2023-05-13 DIAGNOSIS — J9811 Atelectasis: Secondary | ICD-10-CM | POA: Diagnosis not present

## 2023-05-13 DIAGNOSIS — R188 Other ascites: Secondary | ICD-10-CM | POA: Diagnosis not present

## 2023-05-13 DIAGNOSIS — E871 Hypo-osmolality and hyponatremia: Secondary | ICD-10-CM | POA: Diagnosis not present

## 2023-05-13 DIAGNOSIS — Z66 Do not resuscitate: Secondary | ICD-10-CM | POA: Diagnosis not present

## 2023-05-13 DIAGNOSIS — D0439 Carcinoma in situ of skin of other parts of face: Secondary | ICD-10-CM | POA: Diagnosis not present

## 2023-05-13 DIAGNOSIS — Z8249 Family history of ischemic heart disease and other diseases of the circulatory system: Secondary | ICD-10-CM | POA: Diagnosis not present

## 2023-05-13 DIAGNOSIS — Z933 Colostomy status: Secondary | ICD-10-CM | POA: Diagnosis not present

## 2023-05-13 DIAGNOSIS — Z85828 Personal history of other malignant neoplasm of skin: Secondary | ICD-10-CM | POA: Diagnosis not present

## 2023-05-13 DIAGNOSIS — R112 Nausea with vomiting, unspecified: Secondary | ICD-10-CM | POA: Diagnosis not present

## 2023-05-13 DIAGNOSIS — I700 Atherosclerosis of aorta: Secondary | ICD-10-CM | POA: Diagnosis not present

## 2023-05-13 DIAGNOSIS — Z515 Encounter for palliative care: Secondary | ICD-10-CM | POA: Diagnosis not present

## 2023-05-13 DIAGNOSIS — K660 Peritoneal adhesions (postprocedural) (postinfection): Secondary | ICD-10-CM

## 2023-05-13 DIAGNOSIS — K5669 Other partial intestinal obstruction: Secondary | ICD-10-CM | POA: Diagnosis not present

## 2023-05-13 DIAGNOSIS — R1110 Vomiting, unspecified: Secondary | ICD-10-CM | POA: Diagnosis not present

## 2023-05-13 DIAGNOSIS — E785 Hyperlipidemia, unspecified: Secondary | ICD-10-CM | POA: Diagnosis not present

## 2023-05-13 LAB — COMPREHENSIVE METABOLIC PANEL WITH GFR
ALT: 16 U/L (ref 0–44)
AST: 21 U/L (ref 15–41)
Albumin: 3.4 g/dL — ABNORMAL LOW (ref 3.5–5.0)
Alkaline Phosphatase: 77 U/L (ref 38–126)
Anion gap: 11 (ref 5–15)
BUN: 13 mg/dL (ref 8–23)
CO2: 23 mmol/L (ref 22–32)
Calcium: 9.4 mg/dL (ref 8.9–10.3)
Chloride: 102 mmol/L (ref 98–111)
Creatinine, Ser: 0.59 mg/dL (ref 0.44–1.00)
GFR, Estimated: >60 mL/min (ref >60)
Glucose, Bld: 99 mg/dL (ref 70–99)
Potassium: 3.9 mmol/L (ref 3.5–5.1)
Sodium: 136 mmol/L (ref 135–145)
Total Bilirubin: 0.7 mg/dL (ref 0.0–1.2)
Total Protein: 6.5 g/dL (ref 6.5–8.1)

## 2023-05-13 LAB — CBC WITH DIFFERENTIAL/PLATELET
Abs Immature Granulocytes: 0.01 10*3/uL (ref 0.00–0.07)
Basophils Absolute: 0 10*3/uL (ref 0.0–0.1)
Basophils Relative: 1 %
Eosinophils Absolute: 0.1 10*3/uL (ref 0.0–0.5)
Eosinophils Relative: 3 %
HCT: 33.9 % — ABNORMAL LOW (ref 36.0–46.0)
Hemoglobin: 11 g/dL — ABNORMAL LOW (ref 12.0–15.0)
Immature Granulocytes: 0 %
Lymphocytes Relative: 23 %
Lymphs Abs: 0.8 10*3/uL (ref 0.7–4.0)
MCH: 30.9 pg (ref 26.0–34.0)
MCHC: 32.4 g/dL (ref 30.0–36.0)
MCV: 95.2 fL (ref 80.0–100.0)
Monocytes Absolute: 0.4 10*3/uL (ref 0.1–1.0)
Monocytes Relative: 12 %
Neutro Abs: 2.1 10*3/uL (ref 1.7–7.7)
Neutrophils Relative %: 61 %
Platelets: 189 10*3/uL (ref 150–400)
RBC: 3.56 MIL/uL — ABNORMAL LOW (ref 3.87–5.11)
RDW: 16.4 % — ABNORMAL HIGH (ref 11.5–15.5)
WBC: 3.5 10*3/uL — ABNORMAL LOW (ref 4.0–10.5)
nRBC: 0 % (ref 0.0–0.2)

## 2023-05-13 LAB — MAGNESIUM: Magnesium: 1.9 mg/dL (ref 1.7–2.4)

## 2023-05-13 MED ORDER — SODIUM CHLORIDE 0.9% FLUSH
10.0000 mL | INTRAVENOUS | Status: DC | PRN
  Administered 2023-05-13: 10 mL via INTRAVENOUS

## 2023-05-13 MED ORDER — HEPARIN SOD (PORK) LOCK FLUSH 100 UNIT/ML IV SOLN
500.0000 [IU] | Freq: Once | INTRAVENOUS | Status: AC
  Administered 2023-05-13: 500 [IU] via INTRAVENOUS

## 2023-05-13 NOTE — Progress Notes (Signed)
 Specialty Pharmacy Ongoing Clinical Assessment Note  Rachel Carlson is a 78 y.o. female who is being followed by the specialty pharmacy service for RxSp Oncology   Patient's specialty medication(s) reviewed today: Niraparib Tosylate (ZEJULA)   Missed doses in the last 4 weeks: 0   Patient/Caregiver did not have any additional questions or concerns.   Therapeutic benefit summary: Unable to assess   Adverse events/side effects summary: Experienced adverse events/side effects (morning fatigue)   Patient's therapy is appropriate to: Continue    Goals Addressed             This Visit's Progress    Slow Disease Progression       Patient is unable to be assessed as therapy was recently initiated. Patient will maintain adherence          Follow up:  3 months  Wendell Nicoson M Sulma Ruffino Specialty Pharmacist

## 2023-05-13 NOTE — Progress Notes (Signed)
 Patients port flushed without difficulty.  Good blood return noted with no bruising or swelling noted at site.  Band aid applied.  VSS with discharge and left in satisfactory condition with no s/s of distress noted.

## 2023-05-13 NOTE — Discharge Instructions (Signed)
 Adding 4 inch pouch with barrier ring

## 2023-05-13 NOTE — Progress Notes (Signed)
 Patient is taking Zejula as prescribed.  She has not missed any doses and reports no side effects at this time.

## 2023-05-13 NOTE — Patient Instructions (Signed)
 [facility] at [facility] Discharge Instructions   You were seen and examined today by Dr. Cheree Cords.  He reviewed the results of your lab work which are normal/stable.   Continue Zejula as prescribed.   We will see you back in . We will repeat lab work at this time.   Return as scheduled.    Thank you for choosing [facility] at [facility] to provide your oncology and hematology care.  To afford each patient quality time with our provider, please arrive at least 15 minutes before your scheduled appointment time.   If you have a lab appointment with the Cancer Center please come in thru the Main Entrance and check in at the main information desk.  You need to re-schedule your appointment should you arrive 10 or more minutes late.  We strive to give you quality time with our providers, and arriving late affects you and other patients whose appointments are after yours.  Also, if you no show three or more times for appointments you may be dismissed from the clinic at the providers discretion.     Again, thank you for choosing [facility].  Our hope is that these requests will decrease the amount of time that you wait before being seen by our physicians.       _____________________________________________________________  Should you have questions after your visit to [facility], please contact our office at [phone] and follow the prompts.  Our office hours are 8:00 a.m. and 4:30 p.m. Monday - Friday.  Please note that voicemails left after 4:00 p.m. may not be returned until the following business day.  We are closed weekends and major holidays.  You do have access to a nurse 24-7, just call the main number to the clinic [phone] and do not press any options, hold on the line and a nurse will answer the phone.    For prescription refill requests, have your pharmacy contact our office and allow 72 hours.     Due to Covid, you will need to wear a mask upon entering the hospital. If you do not have a mask, a mask will be given to you at the Main Entrance upon arrival. For doctor visits, patients may have 1 support person age 83 or older with them. For treatment visits, patients can not have anyone with them due to social distancing guidelines and our immunocompromised population.

## 2023-05-13 NOTE — Progress Notes (Signed)
 [facility] [address] [place] [address]    Clinic Day:  05/13/2023  Referring physician: Tower, Audrie Gallus, MD  Patient Care Team: Tower, Audrie Gallus, MD as PCP - General Rollene Rotunda, MD as PCP - Cardiology (Cardiology) Doreatha Massed, MD as Medical Oncologist (Medical Oncology) Therese Sarah, RN as Oncology Nurse Navigator (Medical Oncology)   ASSESSMENT & PLAN:   Assessment: 1.  Peritoneal carcinomatosis/ovarian cancer: - Presentation: 30-month history of abdominal distention and bloating. - Last colonoscopy (01/01/2020): No evidence of malignancy. - CTAP (08/23/2022): Moderate ascites, probable omental caking in the left lower quadrant, possible small peritoneal implants seen in the pelvis.  Diverticulosis of descending and sigmoid colon with moderate wall thickening of the distal sigmoid colon.  Small bilateral pleural effusions. - 08/26/2022: CA125: 7356, CEA: 1 - Germline mutation testing (09/16/2017): Negative. - 4 cycles of carboplatin and paclitaxel from 09/12/2022 through 11/14/2022 - 12/12/2022: Tumor debulking surgery including total omentectomy, resection of peritoneal disease, small bowel resection with reanastomosis, en bloc resection of the left fallopian tube and ovary as well as rectosigmoid colon, left ureterolysis, right salpingo-oophorectomy, end descending colostomy by Dr. Pricilla Holm - Pathology: Omental resection-high-grade serous carcinoma, cecal implant-high-grade serous carcinoma, ileal resection-mesenteric and serosal involvement by high-grade serous carcinoma, left lower abdominal wall nodule-high-grade serous carcinoma, right tube and ovary resection-high-grade serous carcinoma involving serosa, bladder peritoneum and rectosigmoid colon-high-grade serous carcinoma involving colonic wall and extensive involvement of the serosa and mesenteric adipose tissue.  Metastatic carcinoma involving 8/13 nodes.  pT3 cpN1a. - NGS: MSI-stable,  TMB-low, HRD-negative, T p53 pathogenic variant, PD-L1 (22 C3) CPS 5, ER 2+ positive, FOLR1 2+, 75% (mirvetuximab) - 4 cycles of carboplatin and paclitaxel from 01/17/2023 through 03/25/2023 - Germline mutation testing: Negative - Niraparib 200 mg daily started on 04/22/2023   2. Social/Family History: -She lives at home with husband and is his primary caregiver. She is independent ADLS and iADLS. Retired International aid/development worker. No tobacco use.  -She had skin squamous cell carcinoma. Her brother had colon cancer at 1 and follicular non-Hodgkin's lymphoma and DLBCL, her maternal grandfather died of prostate cancer, maternal first cousin had colon cancer, and maternal aunt died of lung cancer (smoker).     Plan: 1.  Peritoneal carcinomatosis/ovarian cancer: - CT CAP (03/17/2022): No significant change in the diffuse residual peritoneal thickening without discrete visible nodularity.  Moderate loculated left pleural effusion.  Trace right pleural effusion.  No evidence of lymphadenopathy or metastatic disease to the liver. - She started niraparib 200 mg at bedtime on 04/22/2023. - Had overnight hospitalization from stoma problem.  I reviewed hospital records. - She denied any significant GI side effects.  She had 1 episode of nausea and vomiting 1 evening precluding her from taking niraparib.  Compazine helped.  Denies any further episodes of nausea or vomiting or diarrhea.  She has constipation and takes MiraLAX twice daily. - No significant tiredness reported since start of niraparib. - Labs today: White count 3.5 with normal ANC.  Hemoglobin 11 and platelet count 189.  LFTs and creatinine are normal. - If there is recurrent effusion, consider adding bevacizumab. - Continue niraparib 200 mg daily.  RTC 4 weeks for follow-up with repeat labs.    2.  Left pleural effusion: - Last left thoracentesis on 03/29/2023: 1.2 L removed.  Prior thoracentesis on 03/06/2023, 2 L removed.  On auscultation, decreased breath  sounds at the left lung base.  She is not symptomatic.   3.  Left gastrocnemius/calf  vein distal DVT (Dx 07/24/2022): - Continue Eliquis twice daily.  No issues with bleeding.   4.  Peripheral neuropathy: - Tingling in the fingertips is constant and stable.     5.  Insomnia: - Continue temazepam 30 mg at bedtime as needed which is helping.    No orders of the defined types were placed in this encounter.     Rachel Carlson,acting as a Neurosurgeon for [name], MD.,have documented all relevant documentation on the behalf of [name], MD,as directed by  [name], MD while in the presence of [name], MD.  I, [name] MD, have reviewed the above documentation for accuracy and completeness, and I agree with the above.     [name], MD   4/14/202510:23 AM  CHIEF COMPLAINT:   Diagnosis: Peritoneal carcinomatosis/ovarian cancer    Cancer Staging  No matching staging information was found for the patient.    Prior Therapy: 1. Neoadjuvant carboplatin and paclitaxel, 4 cycles, 09/12/22 - 11/14/22 2. Tumor debulking surgery on 12/12/22  Current Therapy:  adjuvant carboplatin and paclitaxel    HISTORY OF PRESENT ILLNESS:   Oncology History Overview Note  CARIS: FOLR1 2+ (75%) ER(2+) HRD-, PDL1 CPS 5%, p53 mutation No mutation in BRCA1/2, ATM, ERBB2, KRAS PR negative   Carcinomatosis (HCC)  08/31/2022 Initial Diagnosis   Carcinomatosis (HCC)   09/12/2022 - 03/25/2023 Chemotherapy   Patient is on Treatment Plan : OVARIAN Carboplatin (AUC 6) + Paclitaxel (175) q21d X 6 Cycles     Gynecologic malignancy (HCC)  12/12/2022 Initial Diagnosis   Gynecologic malignancy (HCC)   02/19/2023 Genetic Testing   Negative genetic testing on the Multi-cancer + RNA panel.  The report date is February 19, 2023.  The Multi-Cancer + RNA Panel offered by [facility] includes sequencing and/or deletion/duplication analysis of the  following 70 genes:  AIP*, ALK, APC*, ATM*, AXIN2*, BAP1*, BARD1*, BLM*, BMPR1A*, BRCA1*, BRCA2*, BRIP1*, CDC73*, CDH1*, CDK4, CDKN1B*, CDKN2A, CHEK2*, CTNNA1*, DICER1*, EPCAM (del/dup only), EGFR, FH*, FLCN*, GREM1 (promoter dup only), HOXB13, KIT, LZTR1, MAX*, MBD4, MEN1*, MET, MITF, MLH1*, MSH2*, MSH3*, MSH6*, MUTYH*, NF1*, NF2*, NTHL1*, PALB2*, PDGFRA, PMS2*, POLD1*, POLE*, POT1*, PRKAR1A*, PTCH1*, PTEN*, RAD51C*, RAD51D*, RB1*, RET, SDHA* (sequencing only), SDHAF2*, SDHB*, SDHC*, SDHD*, SMAD4*, SMARCA4*, SMARCB1*, SMARCE1*, STK11*, SUFU*, TMEM127*, TP53*, TSC1*, TSC2*, VHL*. RNA analysis is performed for * genes.   Negative genetic testing on the common hereditary cancer panel.  The Hereditary Gene Panel offered by [facility] includes sequencing and/or deletion duplication testing of the following 47 genes: APC, ATM, AXIN2, BARD1, BMPR1A, BRCA1, BRCA2, BRIP1, CDH1, CDK4, CDKN2A (p14ARF), CDKN2A (p16INK4a), CHEK2, CTNNA1, DICER1, EPCAM (Deletion/duplication testing only), GREM1 (promoter region deletion/duplication testing only), KIT, MEN1, MLH1, MSH2, MSH3, MSH6, MUTYH, NBN, NF1, NHTL1, PALB2, PDGFRA, PMS2, POLD1, POLE, PTEN, RAD50, RAD51C, RAD51D, SDHB, SDHC, SDHD, SMAD4, SMARCA4. STK11, TP53, TSC1, TSC2, and VHL.  The following genes were evaluated for sequence changes only: SDHA and HOXB13 c.251G>A variant only. The report date is September 25, 2017.      INTERVAL HISTORY:   Rachel Carlson is a 78 y.o. female presenting to clinic today for follow up of Peritoneal carcinomatosis/ovarian cancer. She was last seen by me on 04/22/23.  Since her last visit, she was admitted to the hospital from 04/26/23 to 04/27/23 for intestinal stoma prolapse.   Today, she states that she is doing well overall. Her appetite level is at 50%. Her energy level is at 50%.  PAST MEDICAL HISTORY:   Past Medical History: Past Medical History:  Diagnosis Date   Blood transfusion without reported diagnosis    Chronic right ear pain     normal MRI   DVT of leg (deep venous thrombosis) (HCC)    Esophageal stricture    Family history of breast cancer    Family history of colon cancer    Family history of melanoma    Family history of prostate cancer    Fibroids 1998   uterine, history of (left ovaries)   GERD (gastroesophageal reflux disease)    History of hiatal hernia    History of uterine prolapse    Hx of adenomatous polyp of colon 10/19/2014   Hyperlipidemia    Hypertension    borderline no meds diet and exercise   Neuromuscular disorder (HCC)    neuropathy from chemo   Obesity    Ovarian cancer (HCC)    PVC (premature ventricular contraction)    first dx by holter in 1980's, echo (6/10) EF 55-60%, normal diastolic fxn, normal size RV and fxn, mild MR, PASP   Squamous cell carcinoma of arm    ovarian   Tubal pregnancy    Ulcerative proctosigmoiditis (HCC)    Urge urinary incontinence 06/03/2007   Qualifier: Diagnosis of  By: Lennice Quivers CMA, Kelly Patient      Surgical History: Past Surgical History:  Procedure Laterality Date   BILATERAL SALPINGECTOMY Bilateral 12/12/2022   Procedure: OPEN BILATERAL SALPINGECTOMY-OOPHORECTOMY;  Surgeon: Suzi Essex, MD;  Location: [facility];  Service: Gynecology;  Laterality: Bilateral;   BLADDER NECK SUSPENSION  1998   BREAST BIOPSY Left    BREAST LUMPECTOMY Left    benign   CATARACT EXTRACTION Right 01/2020   CATARACT EXTRACTION W/PHACO Left 08/12/2020   Procedure: CATARACT EXTRACTION PHACO AND INTRAOCULAR LENS PLACEMENT LEFT EYE;  Surgeon: Tarri Farm, MD;  Location: [facility];  Service: Ophthalmology;  Laterality: Left;  left CDE=10.71   COLONOSCOPY  05/18/2011/10/12/14   Dr. Loy Ruff   DEBULKING N/A 12/12/2022   Procedure: RADICAL TUMOR DEBULKING, PERITONEAL STRIPPING, MOBILIZATION SPLENIC FLEXURE, RECTOSIGMOID RESECTION,SMALL BOWEL RESECTION AND REANASTOMOSIS ,END TO END DESCENDING COLOSTOMY;  Surgeon: Suzi Essex, MD;  Location: [facility];   Service: Gynecology;  Laterality: N/A;   ECTOPIC PREGNANCY SURGERY  1980   ESOPHAGOGASTRODUODENOSCOPY (EGD) WITH ESOPHAGEAL DILATION  04/03/2012   Dr. Loy Ruff   INCISION AND DRAINAGE ABSCESS N/A 01/02/2023   Procedure: INCISION AND DRAINAGE OF ABDOMINAL WOUND SEROMA;  Surgeon: Abdul Hodgkin, MD;  Location: [facility];  Service: Gynecology;  Laterality: N/A;   IR IMAGING GUIDED PORT INSERTION  09/06/2022   OMENTECTOMY N/A 12/12/2022   Procedure: OMENTECTOMY;  Surgeon: Suzi Essex, MD;  Location: [facility];  Service: Gynecology;  Laterality: N/A;   PARTIAL HYSTERECTOMY     abdominal, fibroids, and prolapse (1998) bladder tack   SALPINGOOPHORECTOMY Bilateral 12/12/2022   Procedure: DIAGNOSTIC LAPAROSCOPY;  Surgeon: Suzi Essex, MD;  Location: [facility];  Service: Gynecology;  Laterality: Bilateral;   SKIN CANCER EXCISION     UPPER GASTROINTESTINAL ENDOSCOPY      Social History: Social History   Socioeconomic History   Marital status: Married    Spouse name: Not on file   Number of children: 3   Years of education: Not on file   Highest education level: Not on file  Occupational History   Occupation: International aid/development worker    Comment: retired 2009    Employer: unemployed  Tobacco Use   Smoking status: Never   Smokeless tobacco: Never  Advertising account planner  Vaping status: Never Used  Substance and Sexual Activity   Alcohol use: No    Alcohol/week: 0.0 standard drinks of alcohol    Comment: rarely beer   Drug use: No   Sexual activity: Not Currently  Other Topics Concern   Not on file  Social History Narrative   Retired International aid/development worker, 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)   Grown children   Never smoker no alcohol tobacco or drug use   Social Drivers of Corporate investment banker Strain: Low Risk  (08/28/2022)   Overall Financial Resource Strain (CARDIA)    Difficulty of Paying Living Expenses: Not hard at all  Food Insecurity: No Food Insecurity (04/27/2023)   Hunger  Vital Sign    Worried About Running Out of Food in the Last Year: Never true    Ran Out of Food in the Last Year: Never true  Transportation Needs: No Transportation Needs (04/27/2023)   PRAPARE - Administrator, Civil Service (Medical): No    Lack of Transportation (Non-Medical): No  Physical Activity: Inactive (08/28/2022)   Exercise Vital Sign    Days of Exercise per Week: 0 days    Minutes of Exercise per Session: 0 min  Stress: No Stress Concern Present (08/28/2022)   [employer] of Occupational Health - Occupational Stress Questionnaire    Feeling of Stress : Not at all  Social Connections: Moderately Integrated (04/26/2023)   Social Connection and Isolation Panel [NHANES]    Frequency of Communication with Friends and Family: More than three times a week    Frequency of Social Gatherings with Friends and Family: More than three times a week    Attends Religious Services: Never    Database administrator or Organizations: Yes    Attends Engineer, structural: More than 4 times per year    Marital Status: Married  Catering manager Violence: Not At Risk (04/27/2023)   Humiliation, Afraid, Rape, and Kick questionnaire    Fear of Current or Ex-Partner: No    Emotionally Abused: No    Physically Abused: No    Sexually Abused: No    Family History: Family History  Problem Relation Age of Onset   Heart attack Mother 13   Transient ischemic attack Mother    Diabetes type II Mother    Sudden death Mother        died age 21   Diabetes Mother    Coronary artery disease Mother    Skin cancer Mother    Barrett's esophagus Mother    Sudden death Father 50       "coronary arteriosclerosis" on death certificate   Breast cancer Sister        dx in her 21s   Colon polyps Sister        adenomatous   Arrhythmia Brother    Colon cancer Brother 35   Congestive Heart Failure Maternal Grandmother    Prostate cancer Maternal Grandfather        dx in 35s    Tuberculosis Paternal Grandfather    Ehlers-Danlos syndrome Daughter    Colon cancer Son 64       lynch syndrome   Other Son        parotid gland tumor   Skin cancer Maternal Aunt    Barrett's esophagus Maternal Aunt    Melanoma Maternal Uncle    Colon cancer Cousin        mid 95s; maternal cousin   Colon  cancer Other        MGMs brother   Esophageal cancer Neg Hx    Stomach cancer Neg Hx    Rectal cancer Neg Hx    Uterine cancer Neg Hx    Bladder Cancer Neg Hx     Current Medications:  Current Outpatient Medications:    apixaban (ELIQUIS) 5 MG TABS tablet, Take 1 tablet by mouth twice daily, Disp: 180 tablet, Rfl: 0   Ascorbic Acid (VITAMIN C) 1000 MG tablet, Take 1,000 mg by mouth daily., Disp: , Rfl:    calcium citrate-vitamin D (CITRACAL+D) 315-200 MG-UNIT per tablet, Take 1 tablet by mouth 2 (two) times daily., Disp: , Rfl:    Cholecalciferol (VITAMIN D-3) 5000 UNITS TABS, Take 5,000 Units by mouth daily., Disp: , Rfl:    Coenzyme Q10 (CO Q-10) 200 MG CAPS, Take 200 mg by mouth daily., Disp: , Rfl:    CRANBERRY PO, Take 650 mg by mouth daily., Disp: , Rfl:    Glucosamine-Chondroitin (GLUCOSAMINE CHONDR COMPLEX PO), Take 2 capsules by mouth daily., Disp: , Rfl:    GRAPE SEED EXTRACT PO, Take 1 capsule by mouth daily., Disp: , Rfl:    ketoconazole (NIZORAL) 2 % cream, Apply 1 Application topically daily. To yeast rash, Disp: 30 g, Rfl: 1   lidocaine-prilocaine (EMLA) cream, Apply topically daily., Disp: , Rfl:    meclizine (ANTIVERT) 25 MG tablet, TAKE 1 TABLET BY MOUTH THREE TIMES DAILY AS NEEDED FOR NAUSEA OR DIZZINESS, Disp: 20 tablet, Rfl: 0   milk thistle 175 MG tablet, Take 175 mg by mouth daily., Disp: , Rfl:    mirtazapine (REMERON) 15 MG tablet, Take 1 tablet (15 mg total) by mouth at bedtime., Disp: 30 tablet, Rfl: 4   niraparib tosylate (ZEJULA) 200 MG tablet, Take 1 tablet (200 mg total) by mouth daily. May take at bedtime to reduce nausea and vomiting., Disp: 30  tablet, Rfl: 1   Omega-3 Fatty Acids (FISH OIL TRIPLE STRENGTH PO), Take 1 capsule by mouth daily., Disp: , Rfl:    Polyethyl Glycol-Propyl Glycol (SYSTANE OP), Place 1 drop into both eyes 4 (four) times daily as needed (for dryness)., Disp: , Rfl:    PREVACID 24HR 15 MG capsule, Take 15 mg by mouth See admin instructions. Take 15 mg by mouth in the morning before any food and an additional 15 mg once a day as needed for unresolved reflux, Disp: , Rfl:    prochlorperazine (COMPAZINE) 10 MG tablet, Take 10 mg by mouth every 6 (six) hours as needed for nausea or vomiting., Disp: , Rfl:    rosuvastatin (CRESTOR) 20 MG tablet, Take 1 tablet (20 mg total) by mouth at bedtime., Disp: 90 tablet, Rfl: 3   temazepam (RESTORIL) 30 MG capsule, Take 1 capsule (30 mg total) by mouth at bedtime as needed for sleep., Disp: 30 capsule, Rfl: 0   vitamin B-12 (CYANOCOBALAMIN) 1000 MCG tablet, Take 1,000 mcg by mouth daily., Disp: , Rfl:    zinc gluconate 50 MG tablet, Take 50 mg by mouth daily., Disp: , Rfl:    Allergies: Allergies  Allergen Reactions   Aspartame     Intolerance to artifical sweeteners, pt states caused burning of bladder   Levaquin [Levofloxacin] Nausea Only   Pantoprazole Sodium Diarrhea    Abdominal pain   Ranitidine Diarrhea    abdominal pain   Omeprazole Diarrhea    Abdominal pain  Other Reaction(s): Not available   Sulfasalazine Rash and Other (See Comments)  Fever, chills, headache, muscle pain also    REVIEW OF SYSTEMS:   Review of Systems  Constitutional:  Negative for chills, fatigue and fever.  HENT:   Negative for lump/mass, mouth sores, nosebleeds, sore throat and trouble swallowing.   Eyes:  Negative for eye problems.  Respiratory:  Negative for cough and shortness of breath.   Cardiovascular:  Negative for chest pain, leg swelling and palpitations.  Gastrointestinal:  Positive for nausea. Negative for abdominal pain, constipation, diarrhea and vomiting.   Genitourinary:  Negative for bladder incontinence, difficulty urinating, dysuria, frequency, hematuria and nocturia.   Musculoskeletal:  Negative for arthralgias, back pain, flank pain, myalgias and neck pain.  Skin:  Negative for itching and rash.  Neurological:  Positive for dizziness and numbness. Negative for headaches.  Hematological:  Does not bruise/bleed easily.  Psychiatric/Behavioral:  Negative for depression, sleep disturbance and suicidal ideas. The patient is not nervous/anxious.   All other systems reviewed and are negative.    VITALS:   Last menstrual period 05/29/1996.  Wt Readings from Last 3 Encounters:  05/13/23 160 lb 7.9 oz (72.8 kg)  05/10/23 158 lb (71.7 kg)  04/26/23 157 lb 6.5 oz (71.4 kg)    There is no height or weight on file to calculate BMI.  Performance status (ECOG): 1 - Symptomatic but completely ambulatory  PHYSICAL EXAM:   Physical Exam Vitals and nursing note reviewed. Exam conducted with a chaperone present.  Constitutional:      Appearance: Normal appearance.  Cardiovascular:     Rate and Rhythm: Normal rate and regular rhythm.     Pulses: Normal pulses.     Heart sounds: Normal heart sounds.  Pulmonary:     Effort: Pulmonary effort is normal.     Breath sounds: Normal breath sounds.  Abdominal:     Palpations: Abdomen is soft. There is no hepatomegaly, splenomegaly or mass.     Tenderness: There is no abdominal tenderness.  Musculoskeletal:     Right lower leg: No edema.     Left lower leg: No edema.  Lymphadenopathy:     Cervical: No cervical adenopathy.     Right cervical: No superficial, deep or posterior cervical adenopathy.    Left cervical: No superficial, deep or posterior cervical adenopathy.     Upper Body:     Right upper body: No supraclavicular or axillary adenopathy.     Left upper body: No supraclavicular or axillary adenopathy.  Neurological:     General: No focal deficit present.     Mental Status: She is alert  and oriented to person, place, and time.  Psychiatric:        Mood and Affect: Mood normal.        Behavior: Behavior normal.    LABS:   CBC     Component Value Date/Time   WBC 3.5 (L) 05/13/2023 0919   RBC 3.56 (L) 05/13/2023 0919   HGB 11.0 (L) 05/13/2023 0919   HGB 11.0 (L) 04/26/2023 1043   HCT 33.9 (L) 05/13/2023 0919   PLT 189 05/13/2023 0919   PLT 199 04/26/2023 1043   MCV 95.2 05/13/2023 0919   MCH 30.9 05/13/2023 0919   MCHC 32.4 05/13/2023 0919   RDW 16.4 (H) 05/13/2023 0919   LYMPHSABS 0.8 05/13/2023 0919   MONOABS 0.4 05/13/2023 0919   EOSABS 0.1 05/13/2023 0919   BASOSABS 0.0 05/13/2023 0919    CMP      Component Value Date/Time   NA 138 04/26/2023 1043  K 3.8 04/26/2023 1043   CL 106 04/26/2023 1043   CO2 27 04/26/2023 1043   GLUCOSE 102 (H) 04/26/2023 1043   BUN 12 04/26/2023 1043   CREATININE 0.64 04/26/2023 1043   CALCIUM 9.6 04/26/2023 1043   PROT 7.0 04/26/2023 1043   ALBUMIN 4.1 04/26/2023 1043   AST 11 (L) 04/26/2023 1043   ALT 7 04/26/2023 1043   ALKPHOS 71 04/26/2023 1043   BILITOT 0.7 04/26/2023 1043   GFRNONAA >60 04/26/2023 1043   GFRAA 94 06/04/2007 1522     Lab Results  Component Value Date   CEA1 1.0 08/26/2022   /  CEA  Date Value Ref Range Status  08/26/2022 1.0 0.0 - 4.7 ng/mL Final    Comment:    (NOTE)                             Nonsmokers          <3.9                             Smokers             <5.6 [employer] Electrochemiluminescence Immunoassay (ECLIA) Values obtained with different assay methods or kits cannot be used interchangeably.  Results cannot be interpreted as absolute evidence of the presence or absence of malignant disease. Performed At: [facility] [address] [place], [place] [id number] Pearlean Botts MD [id number]    No results found for: "PSA1" No results found for: "UUV253" Lab Results  Component Value Date   CAN125 274.0 (H) 04/22/2023    No results found  for: "TOTALPROTELP", "ALBUMINELP", "A1GS", "A2GS", "BETS", "BETA2SER", "GAMS", "MSPIKE", "SPEI" Lab Results  Component Value Date   TIBC 369 10/24/2022   FERRITIN 22 10/24/2022   IRONPCTSAT 10 (L) 10/24/2022   No results found for: "LDH"   STUDIES:   No results found.

## 2023-05-13 NOTE — Progress Notes (Signed)
 Specialty Pharmacy Refill Coordination Note  Rachel Carlson is a 78 y.o. female contacted today regarding refills of specialty medication(s) [facility])   Patient requested Delivery   Delivery date: 05/15/23   Verified address: [address]  [place] [address]   Medication will be filled on 05/14/23.

## 2023-05-14 ENCOUNTER — Other Ambulatory Visit: Payer: Self-pay

## 2023-05-14 ENCOUNTER — Other Ambulatory Visit

## 2023-05-14 ENCOUNTER — Ambulatory Visit: Admitting: Hematology

## 2023-05-14 DIAGNOSIS — D0439 Carcinoma in situ of skin of other parts of face: Secondary | ICD-10-CM | POA: Diagnosis not present

## 2023-05-14 LAB — CA 125: Cancer Antigen (CA) 125: 439 U/mL — ABNORMAL HIGH (ref 0.0–38.1)

## 2023-05-16 ENCOUNTER — Inpatient Hospital Stay (HOSPITAL_COMMUNITY)
Admission: EM | Admit: 2023-05-16 | Discharge: 2023-06-01 | DRG: 389 | Disposition: A | Discharge status: Home / Self Care | Attending: Family Medicine | Admitting: Family Medicine

## 2023-05-16 ENCOUNTER — Emergency Department (HOSPITAL_COMMUNITY)

## 2023-05-16 ENCOUNTER — Inpatient Hospital Stay (HOSPITAL_COMMUNITY)

## 2023-05-16 ENCOUNTER — Other Ambulatory Visit: Payer: Self-pay

## 2023-05-16 ENCOUNTER — Encounter (HOSPITAL_COMMUNITY): Payer: Self-pay

## 2023-05-16 ENCOUNTER — Telehealth: Payer: Self-pay

## 2023-05-16 DIAGNOSIS — K219 Gastro-esophageal reflux disease without esophagitis: Secondary | ICD-10-CM | POA: Diagnosis present

## 2023-05-16 DIAGNOSIS — Y833 Surgical operation with formation of external stoma as the cause of abnormal reaction of the patient, or of later complication, without mention of misadventure at the time of the procedure: Secondary | ICD-10-CM | POA: Diagnosis not present

## 2023-05-16 DIAGNOSIS — C482 Malignant neoplasm of peritoneum, unspecified: Secondary | ICD-10-CM

## 2023-05-16 DIAGNOSIS — J9811 Atelectasis: Secondary | ICD-10-CM | POA: Diagnosis present

## 2023-05-16 DIAGNOSIS — Z8543 Personal history of malignant neoplasm of ovary: Secondary | ICD-10-CM

## 2023-05-16 DIAGNOSIS — Z90711 Acquired absence of uterus with remaining cervical stump: Secondary | ICD-10-CM | POA: Diagnosis not present

## 2023-05-16 DIAGNOSIS — Z933 Colostomy status: Secondary | ICD-10-CM

## 2023-05-16 DIAGNOSIS — Z86718 Personal history of other venous thrombosis and embolism: Secondary | ICD-10-CM | POA: Diagnosis not present

## 2023-05-16 DIAGNOSIS — Z7901 Long term (current) use of anticoagulants: Secondary | ICD-10-CM

## 2023-05-16 DIAGNOSIS — K449 Diaphragmatic hernia without obstruction or gangrene: Secondary | ICD-10-CM | POA: Diagnosis not present

## 2023-05-16 DIAGNOSIS — R140 Abdominal distension (gaseous): Secondary | ICD-10-CM | POA: Diagnosis not present

## 2023-05-16 DIAGNOSIS — I10 Essential (primary) hypertension: Secondary | ICD-10-CM | POA: Diagnosis present

## 2023-05-16 DIAGNOSIS — Z85831 Personal history of malignant neoplasm of soft tissue: Secondary | ICD-10-CM

## 2023-05-16 DIAGNOSIS — D509 Iron deficiency anemia, unspecified: Secondary | ICD-10-CM | POA: Diagnosis present

## 2023-05-16 DIAGNOSIS — Z4682 Encounter for fitting and adjustment of non-vascular catheter: Secondary | ICD-10-CM | POA: Diagnosis not present

## 2023-05-16 DIAGNOSIS — J90 Pleural effusion, not elsewhere classified: Secondary | ICD-10-CM | POA: Diagnosis not present

## 2023-05-16 DIAGNOSIS — Z79899 Other long term (current) drug therapy: Secondary | ICD-10-CM | POA: Diagnosis not present

## 2023-05-16 DIAGNOSIS — Z8249 Family history of ischemic heart disease and other diseases of the circulatory system: Secondary | ICD-10-CM | POA: Diagnosis not present

## 2023-05-16 DIAGNOSIS — C786 Secondary malignant neoplasm of retroperitoneum and peritoneum: Secondary | ICD-10-CM | POA: Diagnosis present

## 2023-05-16 DIAGNOSIS — I700 Atherosclerosis of aorta: Secondary | ICD-10-CM | POA: Diagnosis present

## 2023-05-16 DIAGNOSIS — K56600 Partial intestinal obstruction, unspecified as to cause: Secondary | ICD-10-CM | POA: Diagnosis not present

## 2023-05-16 DIAGNOSIS — E876 Hypokalemia: Secondary | ICD-10-CM | POA: Diagnosis present

## 2023-05-16 DIAGNOSIS — Z7189 Other specified counseling: Secondary | ICD-10-CM | POA: Diagnosis not present

## 2023-05-16 DIAGNOSIS — R7303 Prediabetes: Secondary | ICD-10-CM | POA: Diagnosis present

## 2023-05-16 DIAGNOSIS — E871 Hypo-osmolality and hyponatremia: Secondary | ICD-10-CM | POA: Diagnosis present

## 2023-05-16 DIAGNOSIS — K5669 Other partial intestinal obstruction: Principal | ICD-10-CM | POA: Diagnosis present

## 2023-05-16 DIAGNOSIS — E785 Hyperlipidemia, unspecified: Secondary | ICD-10-CM | POA: Diagnosis present

## 2023-05-16 DIAGNOSIS — E669 Obesity, unspecified: Secondary | ICD-10-CM | POA: Diagnosis present

## 2023-05-16 DIAGNOSIS — R112 Nausea with vomiting, unspecified: Principal | ICD-10-CM

## 2023-05-16 DIAGNOSIS — Z515 Encounter for palliative care: Secondary | ICD-10-CM | POA: Diagnosis not present

## 2023-05-16 DIAGNOSIS — N3281 Overactive bladder: Secondary | ICD-10-CM | POA: Diagnosis present

## 2023-05-16 DIAGNOSIS — Z85828 Personal history of other malignant neoplasm of skin: Secondary | ICD-10-CM | POA: Diagnosis not present

## 2023-05-16 DIAGNOSIS — R1110 Vomiting, unspecified: Secondary | ICD-10-CM | POA: Diagnosis not present

## 2023-05-16 DIAGNOSIS — R918 Other nonspecific abnormal finding of lung field: Secondary | ICD-10-CM | POA: Diagnosis not present

## 2023-05-16 DIAGNOSIS — Z66 Do not resuscitate: Secondary | ICD-10-CM | POA: Diagnosis not present

## 2023-05-16 DIAGNOSIS — C800 Disseminated malignant neoplasm, unspecified: Secondary | ICD-10-CM | POA: Diagnosis not present

## 2023-05-16 DIAGNOSIS — K56609 Unspecified intestinal obstruction, unspecified as to partial versus complete obstruction: Secondary | ICD-10-CM | POA: Diagnosis not present

## 2023-05-16 DIAGNOSIS — R188 Other ascites: Secondary | ICD-10-CM | POA: Diagnosis not present

## 2023-05-16 DIAGNOSIS — R109 Unspecified abdominal pain: Secondary | ICD-10-CM | POA: Diagnosis not present

## 2023-05-16 DIAGNOSIS — R300 Dysuria: Secondary | ICD-10-CM | POA: Diagnosis not present

## 2023-05-16 DIAGNOSIS — Z1722 Progesterone receptor negative status: Secondary | ICD-10-CM

## 2023-05-16 LAB — CBC WITH DIFFERENTIAL/PLATELET
Abs Immature Granulocytes: 0.02 10*3/uL (ref 0.00–0.07)
Basophils Absolute: 0 10*3/uL (ref 0.0–0.1)
Basophils Relative: 0 %
Eosinophils Absolute: 0 10*3/uL (ref 0.0–0.5)
Eosinophils Relative: 1 %
HCT: 40.4 % (ref 36.0–46.0)
Hemoglobin: 13.4 g/dL (ref 12.0–15.0)
Immature Granulocytes: 0 %
Lymphocytes Relative: 5 %
Lymphs Abs: 0.4 10*3/uL — ABNORMAL LOW (ref 0.7–4.0)
MCH: 31.3 pg (ref 26.0–34.0)
MCHC: 33.2 g/dL (ref 30.0–36.0)
MCV: 94.4 fL (ref 80.0–100.0)
Monocytes Absolute: 0.8 10*3/uL (ref 0.1–1.0)
Monocytes Relative: 9 %
Neutro Abs: 7.3 10*3/uL (ref 1.7–7.7)
Neutrophils Relative %: 85 %
Platelets: 236 10*3/uL (ref 150–400)
RBC: 4.28 MIL/uL (ref 3.87–5.11)
RDW: 17 % — ABNORMAL HIGH (ref 11.5–15.5)
WBC: 8.6 10*3/uL (ref 4.0–10.5)
nRBC: 0 % (ref 0.0–0.2)

## 2023-05-16 LAB — COMPREHENSIVE METABOLIC PANEL WITH GFR
ALT: 12 U/L (ref 0–44)
AST: 19 U/L (ref 15–41)
Albumin: 3.5 g/dL (ref 3.5–5.0)
Alkaline Phosphatase: 72 U/L (ref 38–126)
Anion gap: 10 (ref 5–15)
BUN: 10 mg/dL (ref 8–23)
CO2: 19 mmol/L — ABNORMAL LOW (ref 22–32)
Calcium: 8.4 mg/dL — ABNORMAL LOW (ref 8.9–10.3)
Chloride: 107 mmol/L (ref 98–111)
Creatinine, Ser: 0.51 mg/dL (ref 0.44–1.00)
GFR, Estimated: >60 mL/min (ref >60)
Glucose, Bld: 136 mg/dL — ABNORMAL HIGH (ref 70–99)
Potassium: 3.1 mmol/L — ABNORMAL LOW (ref 3.5–5.1)
Sodium: 136 mmol/L (ref 135–145)
Total Bilirubin: 0.9 mg/dL (ref 0.0–1.2)
Total Protein: 6.3 g/dL — ABNORMAL LOW (ref 6.5–8.1)

## 2023-05-16 LAB — LIPASE, BLOOD: Lipase: 24 U/L (ref 11–51)

## 2023-05-16 LAB — MAGNESIUM: Magnesium: 1.9 mg/dL (ref 1.7–2.4)

## 2023-05-16 LAB — PHOSPHORUS: Phosphorus: 3.1 mg/dL (ref 2.5–4.6)

## 2023-05-16 MED ORDER — METOCLOPRAMIDE HCL 5 MG/ML IJ SOLN
5.0000 mg | Freq: Once | INTRAMUSCULAR | Status: AC
  Administered 2023-05-16: 5 mg via INTRAVENOUS
  Filled 2023-05-16: qty 2

## 2023-05-16 MED ORDER — POTASSIUM CHLORIDE 10 MEQ/100ML IV SOLN
10.0000 meq | INTRAVENOUS | Status: AC
  Administered 2023-05-16 (×4): 10 meq via INTRAVENOUS
  Filled 2023-05-16 (×4): qty 100

## 2023-05-16 MED ORDER — ONDANSETRON HCL 4 MG/2ML IJ SOLN
4.0000 mg | Freq: Four times a day (QID) | INTRAMUSCULAR | Status: DC | PRN
  Administered 2023-05-16 – 2023-05-25 (×6): 4 mg via INTRAVENOUS
  Filled 2023-05-16 (×6): qty 2

## 2023-05-16 MED ORDER — ACETAMINOPHEN 650 MG RE SUPP: 650.0000 mg | Freq: Four times a day (QID) | RECTAL | Status: DC | PRN

## 2023-05-16 MED ORDER — MORPHINE SULFATE (PF) 4 MG/ML IV SOLN
4.0000 mg | INTRAVENOUS | Status: DC | PRN
  Filled 2023-05-16: qty 1

## 2023-05-16 MED ORDER — METOPROLOL TARTRATE 5 MG/5ML IV SOLN
5.0000 mg | Freq: Two times a day (BID) | INTRAVENOUS | Status: DC
  Administered 2023-05-16 – 2023-05-28 (×24): 5 mg via INTRAVENOUS
  Filled 2023-05-16 (×24): qty 5

## 2023-05-16 MED ORDER — DIATRIZOATE MEGLUMINE & SODIUM 66-10 % PO SOLN
90.0000 mL | Freq: Once | ORAL | Status: AC
  Administered 2023-05-16: 90 mL via NASOGASTRIC
  Filled 2023-05-16: qty 90

## 2023-05-16 MED ORDER — ACETAMINOPHEN 325 MG PO TABS
650.0000 mg | ORAL_TABLET | Freq: Four times a day (QID) | ORAL | Status: DC | PRN
  Administered 2023-05-20: 650 mg via ORAL
  Filled 2023-05-16: qty 2

## 2023-05-16 MED ORDER — ONDANSETRON HCL 4 MG/2ML IJ SOLN
4.0000 mg | Freq: Once | INTRAMUSCULAR | Status: AC
  Administered 2023-05-16: 4 mg via INTRAVENOUS
  Filled 2023-05-16: qty 2

## 2023-05-16 MED ORDER — MAGNESIUM SULFATE 2 GM/50ML IV SOLN
2.0000 g | Freq: Once | INTRAVENOUS | Status: AC
Start: 2023-05-16 — End: 2023-05-16
  Administered 2023-05-16: 2 g via INTRAVENOUS
  Filled 2023-05-16: qty 50

## 2023-05-16 MED ORDER — SODIUM CHLORIDE 0.9 % IV SOLN: INTRAVENOUS | Status: AC

## 2023-05-16 MED ORDER — POTASSIUM CHLORIDE 10 MEQ/100ML IV SOLN
10.0000 meq | Freq: Once | INTRAVENOUS | Status: AC
  Administered 2023-05-16: 10 meq via INTRAVENOUS
  Filled 2023-05-16: qty 100

## 2023-05-16 MED ORDER — ENOXAPARIN SODIUM 40 MG/0.4ML IJ SOSY
40.0000 mg | PREFILLED_SYRINGE | INTRAMUSCULAR | Status: DC
  Administered 2023-05-16 – 2023-05-28 (×13): 40 mg via SUBCUTANEOUS
  Filled 2023-05-16 (×15): qty 0.4

## 2023-05-16 MED ORDER — MORPHINE SULFATE (PF) 4 MG/ML IV SOLN
6.0000 mg | Freq: Once | INTRAVENOUS | Status: AC
  Administered 2023-05-16: 6 mg via INTRAVENOUS
  Filled 2023-05-16: qty 2

## 2023-05-16 MED ORDER — ONDANSETRON HCL 4 MG PO TABS: 4.0000 mg | ORAL_TABLET | Freq: Four times a day (QID) | ORAL | Status: DC | PRN

## 2023-05-16 MED ORDER — IOHEXOL 300 MG/ML  SOLN
100.0000 mL | Freq: Once | INTRAMUSCULAR | Status: AC | PRN
  Administered 2023-05-16: 100 mL via INTRAVENOUS

## 2023-05-16 MED ORDER — POTASSIUM CHLORIDE 10 MEQ/100ML IV SOLN: 10.0000 meq | INTRAVENOUS | Status: DC

## 2023-05-16 MED ORDER — PROCHLORPERAZINE EDISYLATE 10 MG/2ML IJ SOLN
5.0000 mg | Freq: Four times a day (QID) | INTRAMUSCULAR | Status: AC | PRN
  Administered 2023-05-16 – 2023-05-18 (×2): 5 mg via INTRAVENOUS
  Filled 2023-05-16 (×2): qty 2

## 2023-05-16 MED ORDER — SODIUM CHLORIDE 0.9 % IV BOLUS
1000.0000 mL | Freq: Once | INTRAVENOUS | Status: AC
  Administered 2023-05-16: 1000 mL via INTRAVENOUS

## 2023-05-16 MED ORDER — DIPHENHYDRAMINE HCL 50 MG/ML IJ SOLN
12.5000 mg | Freq: Once | INTRAMUSCULAR | Status: AC
  Administered 2023-05-16: 12.5 mg via INTRAVENOUS
  Filled 2023-05-16: qty 1

## 2023-05-16 NOTE — Plan of Care (Signed)
  Problem: Pain Managment: Goal: General experience of comfort will improve and/or be controlled Outcome: Progressing   Problem: Safety: Goal: Ability to remain free from injury will improve Outcome: Progressing   Problem: Skin Integrity: Goal: Risk for impaired skin integrity will decrease Outcome: Progressing

## 2023-05-16 NOTE — ED Provider Notes (Signed)
 [place] [facility]GENCY DEPARTMENT AT [facility] Provider Note   CSN: [id number] Arrival date & time: 05/16/23  7829     History  Chief Complaint  Patient presents with   Emesis    DIAMANTINA EDINGER is a 78 y.o. female.  This is a 78 year old female with history of peritoneal carcinomatosis who presents with abdominal pain with vomiting which began about 8 hours ago.  Patient states that she is thrown up about 4 times.  Describes whole body spasms.  Patient denies any blood in her vomit.  Does have an ostomy bag and states has had normal output.  Symptoms unresponsive to home medications.  She is on Eliquis.       Home Medications Prior to Admission medications   Medication Sig Start Date End Date Taking? Authorizing Provider  apixaban (ELIQUIS) 5 MG TABS tablet Take 1 tablet by mouth twice daily 04/15/23   Tower, Audrie Gallus, MD  Ascorbic Acid (VITAMIN C) 1000 MG tablet Take 1,000 mg by mouth daily.    [provider]  calcium citrate-vitamin D (CITRACAL+D) 315-200 MG-UNIT per tablet Take 1 tablet by mouth 2 (two) times daily.    [provider]  Cholecalciferol (VITAMIN D-3) 5000 UNITS TABS Take 5,000 Units by mouth daily.    [provider]  Coenzyme Q10 (CO Q-10) 200 MG CAPS Take 200 mg by mouth daily.    [provider]  CRANBERRY PO Take 650 mg by mouth daily.    [provider]  Glucosamine-Chondroitin (GLUCOSAMINE CHONDR COMPLEX PO) Take 2 capsules by mouth daily.    [provider]  GRAPE SEED EXTRACT PO Take 1 capsule by mouth daily.    [provider]  ketoconazole (NIZORAL) 2 % cream Apply 1 Application topically daily. To yeast rash 08/17/21   Tower, Audrie Gallus, MD  lidocaine-prilocaine (EMLA) cream Apply topically daily. 04/22/23   [provider]  meclizine (ANTIVERT) 25 MG tablet TAKE 1 TABLET BY MOUTH THREE TIMES DAILY AS NEEDED FOR NAUSEA OR DIZZINESS 05/06/23   Tower, Audrie Gallus, MD  milk  thistle 175 MG tablet Take 175 mg by mouth daily.    [provider]  mirtazapine (REMERON) 15 MG tablet Take 1 tablet (15 mg total) by mouth at bedtime. 02/14/23   Cross, Efraim Kaufmann D, NP  niraparib tosylate (ZEJULA) 200 MG tablet Take 1 tablet (200 mg total) by mouth daily. May take at bedtime to reduce nausea and vomiting. 03/25/23   Doreatha Massed, MD  Omega-3 Fatty Acids (FISH OIL TRIPLE STRENGTH PO) Take 1 capsule by mouth daily.    [provider]  Polyethyl Glycol-Propyl Glycol (SYSTANE OP) Place 1 drop into both eyes 4 (four) times daily as needed (for dryness).    [provider]  PREVACID 24HR 15 MG capsule Take 15 mg by mouth See admin instructions. Take 15 mg by mouth in the morning before any food and an additional 15 mg once a day as needed for unresolved reflux    [provider]  prochlorperazine (COMPAZINE) 10 MG tablet Take 10 mg by mouth every 6 (six) hours as needed for nausea or vomiting.    [provider]  rosuvastatin (CRESTOR) 20 MG tablet Take 1 tablet (20 mg total) by mouth at bedtime. 08/20/22   Tower, Audrie Gallus, MD  temazepam (RESTORIL) 30 MG capsule Take 1 capsule (30 mg total) by mouth at bedtime as needed for sleep. 03/05/23   Doreatha Massed, MD  vitamin B-12 (CYANOCOBALAMIN)  1000 MCG tablet Take 1,000 mcg by mouth daily.    [provider]  zinc gluconate 50 MG tablet Take 50 mg by mouth daily.    [provider]      Allergies    Aspartame, Levaquin [levofloxacin], Pantoprazole sodium, Ranitidine, Omeprazole, and Sulfasalazine    Review of Systems   Review of Systems  All other systems reviewed and are negative.   Physical Exam Updated Vital Signs BP (!) 140/83 (BP Location: Left Arm)   Pulse (!) 106   Temp 97.9 F (36.6 C) (Oral)   Resp 18   Ht 1.575 m (5\' 2" )   Wt 72.6 kg   LMP 05/29/1996   SpO2 96%   BMI 29.26 kg/m  Physical Exam Vitals and nursing note reviewed.  Constitutional:       General: She is not in acute distress.    Appearance: Normal appearance. She is well-developed. She is not toxic-appearing.  HENT:     Head: Normocephalic and atraumatic.  Eyes:     General: Lids are normal.     Conjunctiva/sclera: Conjunctivae normal.     Pupils: Pupils are equal, round, and reactive to light.  Neck:     Thyroid: No thyroid mass.     Trachea: No tracheal deviation.  Cardiovascular:     Rate and Rhythm: Normal rate and regular rhythm.     Heart sounds: Normal heart sounds. No murmur heard.    No gallop.  Pulmonary:     Effort: Pulmonary effort is normal. No respiratory distress.     Breath sounds: Normal breath sounds. No stridor. No decreased breath sounds, wheezing, rhonchi or rales.  Abdominal:     General: There is no distension.     Palpations: Abdomen is soft.     Tenderness: There is no abdominal tenderness. There is no rebound.  Musculoskeletal:        General: No tenderness. Normal range of motion.     Cervical back: Normal range of motion and neck supple.  Skin:    General: Skin is warm and dry.     Findings: No abrasion or rash.  Neurological:     Mental Status: She is alert and oriented to person, place, and time. Mental status is at baseline.     GCS: GCS eye subscore is 4. GCS verbal subscore is 5. GCS motor subscore is 6.     Cranial Nerves: No cranial nerve deficit.     Sensory: No sensory deficit.     Motor: Motor function is intact.  Psychiatric:        Attention and Perception: Attention normal.        Speech: Speech normal.        Behavior: Behavior normal.     ED Results / Procedures / Treatments   Labs (all labs ordered are listed, but only abnormal results are displayed) Labs Reviewed  COMPREHENSIVE METABOLIC PANEL WITH GFR - Abnormal; Notable for the following components:      Result Value   Potassium 3.1 (*)    CO2 19 (*)    Glucose, Bld 136 (*)    Calcium 8.4 (*)    Total Protein 6.3 (*)    All other components  within normal limits  LIPASE, BLOOD  URINALYSIS, ROUTINE W REFLEX MICROSCOPIC  CBC WITH DIFFERENTIAL/PLATELET    EKG None  Radiology No results found.  Procedures Procedures    Medications Ordered in ED Medications  0.9 %  sodium chloride infusion (has no  administration in time range)  ondansetron (ZOFRAN) injection 4 mg (has no administration in time range)  morphine (PF) 4 MG/ML injection 6 mg (has no administration in time range)  sodium chloride 0.9 % bolus 1,000 mL (1,000 mLs Intravenous New Bag/Given 05/16/23 1050)    ED Course/ Medical Decision Making/ A&P                                 Medical Decision Making Amount and/or Complexity of Data Reviewed Labs: ordered. Radiology: ordered.  Risk Prescription drug management.   Abdominal CT shows early or intermittent small bowel obstruction.  NG tube to be placed.  Patient given IV fluids.  Also given IV potassium for hyperkalemia seen on her laboratory studies.  Discussed with general surgery and they will see the patient in consultation.  Discussed with [facility] hospitalist and they will admit.  Patient and family at bedside informed of plans        Final Clinical Impression(s) / ED Diagnoses Final diagnoses:  None    Rx / DC Orders ED Discharge Orders     None         Lind Repine, MD 05/16/23 [phone]

## 2023-05-16 NOTE — Plan of Care (Signed)

## 2023-05-16 NOTE — H&P (Signed)
 History and Physical    Patient: Rachel Carlson DOB: 01/23/1946 DOA: 05/16/2023 DOS: the patient was seen and examined on 05/16/2023 PCP: Clemens Curt, MD  Patient coming from: Home  Chief Complaint:  Chief Complaint  Patient presents with   Emesis   HPI: Rachel Carlson is a 78 y.o. female with medical history significant of DVT, fibroids, GERD, history of hiatal hernia, uterine prolapse, adenomatosis polyp, hyperlipidemia, hypertension, neuromuscular disorder, obesity, ovarian cancer, PVCs, tubal pregnancy, ulcerative proctosigmoiditis, urinary origin incontinence who presented to the emergency department with complaints of colicky abdominal pain associated with multiple episodes of emesis.  No fever.  No diarrhea, constipation, melena or hematochezia.  No flank pain, dysuria, frequency or hematuria.  No chest pain, dyspnea, palpitations, diaphoresis, PND or lower extremity edema.  Lab work: CBC showed a white count of 8.6, hemoglobin 13.4 g/dL and platelets 413.  Normal lipase, phosphorus and magnesium.  Potassium was 3.1 and CO2 19 mmol/L.  Normal renal function.  Glucose 136 and corrected calcium 8.8 mg/dL, the rest of the electrolytes and renal function were normal.  LFTs with a total protein of 6.3 g/dL, but otherwise normal.  Imaging: CT abdomen/pelvis without contrast showing early or intermittent small bowel obstruction with a transition point at the level of the prior ileocolic anastomosis.  Bilateral pleural effusions, left greater than right, with stable left lower lobe atelectasis.  Stable mild diffuse peritoneal thickening without decreased nodularity or masses.  Trace abdominal and pelvic ascites.  Aortic atherosclerosis.  Stable large hiatal hernia.   ED course: Initial vital signs were temperature 97.9 F, pulse 96, respiration 18, BP 134/80 mmHg O2 sat 96% on room air.  The patient received diphenhydramine 12.5 mg IVP, magnesium sulfate 2 g IVPB,  metoclopramide 5 mg IVP, morphine 6 mg IVP, ondansetron 4 mg IVP, KCl 10 mEq IVPB x 1 and sodium chloride 1000 mL bolus x 1.  Review of Systems: As mentioned in the history of present illness. All other systems reviewed and are negative.  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)    Esophageal stricture    Family history of breast cancer    Family history of colon cancer    Family history of melanoma    Family history of prostate cancer    Fibroids 1998   uterine, history of (left ovaries)   GERD (gastroesophageal reflux disease)    History of hiatal hernia    History of uterine prolapse    Hx of adenomatous polyp of colon 10/19/2014   Hyperlipidemia    Hypertension    borderline no meds diet and exercise   Neuromuscular disorder (HCC)    neuropathy from chemo   Obesity    Ovarian cancer (HCC)    PVC (premature ventricular contraction)    first dx by holter in 1980's, echo (6/10) EF 55-60%, normal diastolic fxn, normal size RV and fxn, mild MR, PASP   Squamous cell carcinoma of arm    ovarian   Tubal pregnancy    Ulcerative proctosigmoiditis (HCC)    Urge urinary incontinence 06/03/2007   Qualifier: Diagnosis of  By: Lennice Quivers CMA, Kelly Patient     Past Surgical History:  Procedure Laterality Date   BILATERAL SALPINGECTOMY Bilateral 12/12/2022   Procedure: OPEN BILATERAL SALPINGECTOMY-OOPHORECTOMY;  Surgeon: Suzi Essex, MD;  Location: [facility];  Service: Gynecology;  Laterality: Bilateral;   BLADDER NECK SUSPENSION  1998   BREAST BIOPSY Left    BREAST LUMPECTOMY Left    benign   CATARACT EXTRACTION Right 01/2020   CATARACT EXTRACTION W/PHACO Left 08/12/2020   Procedure: CATARACT EXTRACTION PHACO AND INTRAOCULAR LENS PLACEMENT LEFT EYE;  Surgeon: Fabio Pierce, MD;  Location: [facility];  Service: Ophthalmology;  Laterality: Left;   left CDE=10.71   COLONOSCOPY  05/18/2011/10/12/14   Dr. Stan Head   DEBULKING N/A 12/12/2022   Procedure: RADICAL TUMOR DEBULKING, PERITONEAL STRIPPING, MOBILIZATION SPLENIC FLEXURE, RECTOSIGMOID RESECTION,SMALL BOWEL RESECTION AND REANASTOMOSIS ,END TO END DESCENDING COLOSTOMY;  Surgeon: Carver Fila, MD;  Location: [facility];  Service: Gynecology;  Laterality: N/A;   ECTOPIC PREGNANCY SURGERY  1980   ESOPHAGOGASTRODUODENOSCOPY (EGD) WITH ESOPHAGEAL DILATION  04/03/2012   Dr. Stan Head   INCISION AND DRAINAGE ABSCESS N/A 01/02/2023   Procedure: INCISION AND DRAINAGE OF ABDOMINAL WOUND SEROMA;  Surgeon: Antionette Char, MD;  Location: [facility];  Service: Gynecology;  Laterality: N/A;   IR IMAGING GUIDED PORT INSERTION  09/06/2022   OMENTECTOMY N/A 12/12/2022   Procedure: OMENTECTOMY;  Surgeon: Carver Fila, MD;  Location: [facility];  Service: Gynecology;  Laterality: N/A;   PARTIAL HYSTERECTOMY     abdominal, fibroids, and prolapse (1998) bladder tack   SALPINGOOPHORECTOMY Bilateral 12/12/2022   Procedure: DIAGNOSTIC LAPAROSCOPY;  Surgeon: Carver Fila, MD;  Location: [facility];  Service: Gynecology;  Laterality: Bilateral;   SKIN CANCER EXCISION     UPPER GASTROINTESTINAL ENDOSCOPY     Social History:  reports that she has never smoked. She has never used smokeless tobacco. She reports that she does not drink alcohol and does not use drugs.  Allergies  Allergen Reactions   Aspartame     Intolerance to artifical sweeteners, pt states caused burning of bladder   Levaquin [Levofloxacin] Nausea Only   Pantoprazole Sodium Diarrhea    Abdominal pain   Ranitidine Diarrhea    abdominal pain   Omeprazole Diarrhea    Abdominal pain  Other Reaction(s): Not available   Sulfasalazine Rash and Other (See Comments)    Fever, chills, headache, muscle pain also    Family History  Problem Relation Age of Onset   Heart attack Mother 21   Transient ischemic attack Mother     Diabetes type II Mother    Sudden death Mother        died age 60   Diabetes Mother    Coronary artery disease Mother    Skin cancer Mother    Barrett's esophagus Mother    Sudden death Father 16       "coronary arteriosclerosis" on death certificate   Breast cancer Sister        dx in her 30s   Colon polyps Sister        adenomatous   Arrhythmia Brother    Colon cancer Brother 34   Congestive Heart Failure Maternal Grandmother    Prostate cancer Maternal Grandfather        dx in 29s   Tuberculosis Paternal Grandfather    Ehlers-Danlos syndrome Daughter    Colon cancer Son 31       lynch syndrome   Other Son        parotid gland tumor   Skin cancer Maternal Aunt    Barrett's esophagus Maternal Aunt    Melanoma Maternal Uncle    Colon cancer Cousin  mid 72s; maternal cousin   Colon cancer Other        MGMs brother   Esophageal cancer Neg Hx    Stomach cancer Neg Hx    Rectal cancer Neg Hx    Uterine cancer Neg Hx    Bladder Cancer Neg Hx     Prior to Admission medications   Medication Sig Start Date End Date Taking? Authorizing Provider  apixaban (ELIQUIS) 5 MG TABS tablet Take 1 tablet by mouth twice daily 04/15/23   Tower, Audrie Gallus, MD  Ascorbic Acid (VITAMIN C) 1000 MG tablet Take 1,000 mg by mouth daily.    [provider]  calcium citrate-vitamin D (CITRACAL+D) 315-200 MG-UNIT per tablet Take 1 tablet by mouth 2 (two) times daily.    [provider]  Cholecalciferol (VITAMIN D-3) 5000 UNITS TABS Take 5,000 Units by mouth daily.    [provider]  Coenzyme Q10 (CO Q-10) 200 MG CAPS Take 200 mg by mouth daily.    [provider]  CRANBERRY PO Take 650 mg by mouth daily.    [provider]  Glucosamine-Chondroitin (GLUCOSAMINE CHONDR COMPLEX PO) Take 2 capsules by mouth daily.    [provider]  GRAPE SEED EXTRACT PO Take 1 capsule by mouth daily.    [provider]  ketoconazole (NIZORAL) 2 % cream  Apply 1 Application topically daily. To yeast rash 08/17/21   Tower, Audrie Gallus, MD  lidocaine-prilocaine (EMLA) cream Apply topically daily. 04/22/23   [provider]  meclizine (ANTIVERT) 25 MG tablet TAKE 1 TABLET BY MOUTH THREE TIMES DAILY AS NEEDED FOR NAUSEA OR DIZZINESS 05/06/23   Tower, Audrie Gallus, MD  milk thistle 175 MG tablet Take 175 mg by mouth daily.    [provider]  mirtazapine (REMERON) 15 MG tablet Take 1 tablet (15 mg total) by mouth at bedtime. 02/14/23   Cross, Efraim Kaufmann D, NP  niraparib tosylate (ZEJULA) 200 MG tablet Take 1 tablet (200 mg total) by mouth daily. May take at bedtime to reduce nausea and vomiting. 03/25/23   Doreatha Massed, MD  Omega-3 Fatty Acids (FISH OIL TRIPLE STRENGTH PO) Take 1 capsule by mouth daily.    [provider]  Polyethyl Glycol-Propyl Glycol (SYSTANE OP) Place 1 drop into both eyes 4 (four) times daily as needed (for dryness).    [provider]  PREVACID 24HR 15 MG capsule Take 15 mg by mouth See admin instructions. Take 15 mg by mouth in the morning before any food and an additional 15 mg once a day as needed for unresolved reflux    [provider]  prochlorperazine (COMPAZINE) 10 MG tablet Take 10 mg by mouth every 6 (six) hours as needed for nausea or vomiting.    [provider]  rosuvastatin (CRESTOR) 20 MG tablet Take 1 tablet (20 mg total) by mouth at bedtime. 08/20/22   Tower, Audrie Gallus, MD  temazepam (RESTORIL) 30 MG capsule Take 1 capsule (30 mg total) by mouth at bedtime as needed for sleep. 03/05/23   Doreatha Massed, MD  vitamin B-12 (CYANOCOBALAMIN) 1000 MCG tablet Take 1,000 mcg by mouth daily.    [provider]  zinc gluconate 50 MG tablet Take 50 mg by mouth daily.    [provider]    Physical Exam: Vitals:   05/16/23 0954 05/16/23 1004 05/16/23 1051 05/16/23 1444  BP: 134/80  (!) 140/83 123/76  Pulse: (!) 106   89  Resp: 18  18 19  Temp: 97.9 F (36.6  C)   98.8 F (37.1 C)  TempSrc: Oral     SpO2: 96%   95%  Weight:  72.6 kg    Height:  5\' 2"  (1.575 m)     Physical Exam Vitals and nursing note reviewed.  Constitutional:      General: She is awake. She is not in acute distress.    Appearance: Normal appearance. She is ill-appearing.     Comments: NG tube in place.  HENT:     Head: Normocephalic.     Nose: No rhinorrhea.     Mouth/Throat:     Mouth: Mucous membranes are moist.  Eyes:     General: No scleral icterus.    Pupils: Pupils are equal, round, and reactive to light.  Neck:     Vascular: No JVD.  Cardiovascular:     Rate and Rhythm: Normal rate and regular rhythm.     Heart sounds: S1 normal and S2 normal.  Pulmonary:     Effort: Pulmonary effort is normal.     Breath sounds: Normal breath sounds. No wheezing or rales.  Abdominal:     General: Bowel sounds are normal. There is no distension.     Palpations: Abdomen is soft.     Tenderness: There is no abdominal tenderness. There is no guarding.  Musculoskeletal:     Cervical back: Neck supple.     Right lower leg: No edema.     Left lower leg: No edema.  Skin:    General: Skin is warm and dry.  Neurological:     General: No focal deficit present.     Mental Status: She is alert and oriented to person, place, and time.  Psychiatric:        Mood and Affect: Mood normal.        Behavior: Behavior normal. Behavior is cooperative.     Data Reviewed:  Results are pending, will review when available.  04/27/2021 echocardiogram complete. IMPRESSIONS:   1. Left ventricular ejection fraction, by estimation, is 60 to 65%. The  left ventricle has normal function. The left ventricle has no regional  wall motion abnormalities. Left ventricular diastolic parameters were  normal.   2. Right ventricular systolic function is normal. The right ventricular  size is normal. There is normal pulmonary artery systolic pressure.   3. The mitral valve is normal in  structure. Mild mitral valve  regurgitation. No evidence of mitral stenosis.   4. The aortic valve is tricuspid. Aortic valve regurgitation is not  visualized. No aortic stenosis is present.   5. The inferior vena cava is normal in size with greater than 50%  respiratory variability, suggesting right atrial pressure of 3 mmHg.   Assessment and Plan: Principal Problem:   Partial small bowel obstruction (HCC) Inpatient/telemetry Keep NPO. Continue NTG at [facility]. Continue IV fluids. Analgesics as needed. Antiemetics as needed. Keep electrolytes optimized. Follow-up CBC and CMP in AM. Follow-up imaging in the morning. General surgery input appreciated.  Active Problems:   Hypokalemia Replacing. Magnesium was supplemented.    Hyperlipidemia Currently NPO.    GERD (gastroesophageal reflux disease) Resume Prevacid once cleared for oral intake.    Prediabetes CBG monitoring every 6 hours.    Essential hypertension Metoprolol 5 mg IVP twice daily. Parenteral antihypertensives as needed.    Iron deficiency anemia Monitor hematocrit hemoglobin.    Advance Care Planning:   Code Status: Full Code   Consults: [facility].  Family Communication:  Severity of Illness: The appropriate patient status for this patient is INPATIENT. Inpatient status is judged to be reasonable and necessary in order to provide the required intensity of service to ensure the patient's safety. The patient's presenting symptoms, physical exam findings, and initial radiographic and laboratory data in the context of their chronic comorbidities is felt to place them at high risk for further clinical deterioration. Furthermore, it is not anticipated that the patient will be medically stable for discharge from the hospital within 2 midnights of admission.   * I certify that at the point of admission it is my clinical judgment that the patient will require inpatient hospital care spanning beyond 2  midnights from the point of admission due to high intensity of service, high risk for further deterioration and high frequency of surveillance required.*  Author: Danice Dural, MD 05/16/2023 2:55 PM  For on call review [email or web address].   This document was prepared using Dragon voice recognition software and may contain some unintended transcription errors.

## 2023-05-16 NOTE — Consult Note (Signed)
 Gynecologic Oncology Inpatient Consultation   05/16/2023- currently admitted with nausea/emesis with imaging concerning for early or intermittent small bowel obstruction with transition at the level of prior ileocolic anastomosis   Treatment History:        Oncology History Overview Note    CARIS: FOLR1 2+ (75%) ER(2+) HRD-, PDL1 CPS 5%, p53 mutation No mutation in BRCA1/2, ATM, ERBB2, KRAS PR negative    Carcinomatosis (HCC)    08/31/2022 Initial Diagnosis      Carcinomatosis (HCC)      09/12/2022 - 03/25/2023 Chemotherapy      Patient is on Treatment Plan : OVARIAN Carboplatin (AUC 6) + Paclitaxel (175) q21d X 6 Cycles       Gynecologic malignancy (HCC)    12/12/2022 Initial Diagnosis      Gynecologic malignancy (HCC)      02/19/2023 Genetic Testing      Negative genetic testing on the Multi-cancer + RNA panel.  The report date is February 19, 2023.   The Multi-Cancer + RNA Panel offered by [facility] includes sequencing and/or deletion/duplication analysis of the following 70 genes:  AIP*, ALK, APC*, ATM*, AXIN2*, BAP1*, BARD1*, BLM*, BMPR1A*, BRCA1*, BRCA2*, BRIP1*, CDC73*, CDH1*, CDK4, CDKN1B*, CDKN2A, CHEK2*, CTNNA1*, DICER1*, EPCAM (del/dup only), EGFR, FH*, FLCN*, GREM1 (promoter dup only), HOXB13, KIT, LZTR1, MAX*, MBD4, MEN1*, MET, MITF, MLH1*, MSH2*, MSH3*, MSH6*, MUTYH*, NF1*, NF2*, NTHL1*, PALB2*, PDGFRA, PMS2*, POLD1*, POLE*, POT1*, PRKAR1A*, PTCH1*, PTEN*, RAD51C*, RAD51D*, RB1*, RET, SDHA* (sequencing only), SDHAF2*, SDHB*, SDHC*, SDHD*, SMAD4*, SMARCA4*, SMARCB1*, SMARCE1*, STK11*, SUFU*, TMEM127*, TP53*, TSC1*, TSC2*, VHL*. RNA analysis is performed for * genes.    Negative genetic testing on the common hereditary cancer panel.  The Hereditary Gene Panel offered by [facility] includes sequencing and/or deletion duplication testing of the following 47 genes: APC, ATM, AXIN2, BARD1, BMPR1A, BRCA1, BRCA2, BRIP1, CDH1, CDK4, CDKN2A (p14ARF), CDKN2A (p16INK4a), CHEK2, CTNNA1, DICER1,  EPCAM (Deletion/duplication testing only), GREM1 (promoter region deletion/duplication testing only), KIT, MEN1, MLH1, MSH2, MSH3, MSH6, MUTYH, NBN, NF1, NHTL1, PALB2, PDGFRA, PMS2, POLD1, POLE, PTEN, RAD50, RAD51C, RAD51D, SDHB, SDHC, SDHD, SMAD4, SMARCA4. STK11, TP53, TSC1, TSC2, and VHL.  The following genes were evaluated for sequence changes only: SDHA and HOXB13 c.251G>A variant only. The report date is September 25, 2017.        On 12/12/2022, she underwent diagnostic laparoscopy, conversion to exploratory laparotomy with radical tumor debulking including total omentectomy, resection of peritoneal disease, small bowel resection with reanastomosis, en bloc resection of the left fallopian tube and ovary as well as rectosigmoid colon, left ureterolysis, right salpingo-oophorectomy, end descending colostomy, cystoscopy with Dr. Wiley Hanger for primary peritoneal high grade serous carcinoma. Her post-operative course was complicated with an incisional infection with the patient ultimately having the undergo incision and drainage of the post-op wound on 01/02/2023.    Since that time, she has been receiving chemotherapy at [facility]. She has had a CT scan on 03/18/23 returning with no significant change in diffuse, residual peritoneal thickening and moderate left pleural effusion. She underwent two thoracentesis on 03/06/2023 and 03/29/2023. She last received cycle 8 of carboplatin and taxol on 03/25/2023 under the care of Dr. Cheree Cords at [facility]. She is currently taking niraparib daily, which was started on 04/22/2023.   She was recently seen in the office on 04/26/2023 with stoma edema and prolapse. She was admitted to the hospital with consultations with [facility] and ostomy RN. At discharge, referral was placed to meet with colorectal surgeon for further evaluation and discussion  of any potential revision. This appointment has been arranged for 05/29/2023 at [facility] with Dr. Esmond Harps.  She was last seen in the office on 05/10/2023. Stomal edema had improved at that time with prolapse still present, soft stool in ostomy.  She presented to the ER on 05/16/2023 with nausea, emesis, abdominal spasms. Labs included lipase, Cmet with K+ at 3.1, CBC, Mag, Phos, and UA. She underwent a CT AP with contrast today resulting: 1. Findings concerning for early or intermittent small bowel obstruction with transition at the level of prior ileocolic anastomosis. Continued radiographic follow-up recommended. 2. Bilateral pleural effusions, left greater than right, with stable left lower lobe atelectasis. 3. Stable mild diffuse peritoneal thickening without discrete nodularity or masses. 4. Trace abdominal and pelvic ascites. 5.  Aortic Atherosclerosis (ICD10-I70.0). 6. Stable large hiatal hernia.   Interval History: Nausea started early this am. Not feeling any better at this time. Did not take antiemetic at home prior to coming in. Was unable to keep anything down. Ostomy has been having soft stool output and flatus. Has struggled with intermittent constipation at home, more when taking niraparib. No abdominal pain reported. Son at the bedside.    Past Medical/Surgical History:        Past Medical History:  Diagnosis Date   Blood transfusion without reported diagnosis     Chronic right ear pain      normal MRI   DVT of leg (deep venous thrombosis) (HCC)     Esophageal stricture     Family history of breast cancer     Family history of colon cancer     Family history of melanoma     Family history of prostate cancer     Fibroids 1998    uterine, history of (left ovaries)   GERD (gastroesophageal reflux disease)     History of hiatal hernia     History of uterine prolapse     Hx of adenomatous polyp of colon 10/19/2014   Hyperlipidemia     Hypertension      borderline no meds diet and exercise   Neuromuscular disorder (HCC)      neuropathy from chemo   Obesity      Ovarian cancer (HCC)     PVC (premature ventricular contraction)      first dx by holter in 1980's, echo (6/10) EF 55-60%, normal diastolic fxn, normal size RV and fxn, mild MR, PASP   Squamous cell carcinoma of arm      ovarian   Tubal pregnancy     Ulcerative proctosigmoiditis (HCC)     Urge urinary incontinence 06/03/2007    Qualifier: Diagnosis of  By: Lindwood Qua CMA, Jerl Santos                      Past Surgical History:  Procedure Laterality Date   BILATERAL SALPINGECTOMY Bilateral 12/12/2022    Procedure: OPEN BILATERAL SALPINGECTOMY-OOPHORECTOMY;  Surgeon: Carver Fila, MD;  Location: [facility];  Service: Gynecology;  Laterality: Bilateral;   BLADDER NECK SUSPENSION   1998   BREAST BIOPSY Left     BREAST LUMPECTOMY Left      benign   CATARACT EXTRACTION Right 01/2020   CATARACT EXTRACTION W/PHACO Left 08/12/2020    Procedure: CATARACT EXTRACTION PHACO AND INTRAOCULAR LENS PLACEMENT LEFT EYE;  Surgeon: Fabio Pierce, MD;  Location: [facility];  Service: Ophthalmology;  Laterality: Left;  left CDE=10.71   COLONOSCOPY   05/18/2011/10/12/14  Dr. Loy Ruff   DEBULKING N/A 12/12/2022    Procedure: RADICAL TUMOR DEBULKING, PERITONEAL STRIPPING, MOBILIZATION SPLENIC FLEXURE, RECTOSIGMOID RESECTION,SMALL BOWEL RESECTION AND REANASTOMOSIS ,END TO END DESCENDING COLOSTOMY;  Surgeon: Suzi Essex, MD;  Location: [facility];  Service: Gynecology;  Laterality: N/A;   ECTOPIC PREGNANCY SURGERY   1980   ESOPHAGOGASTRODUODENOSCOPY (EGD) WITH ESOPHAGEAL DILATION   04/03/2012    Dr. Loy Ruff   INCISION AND DRAINAGE ABSCESS N/A 01/02/2023    Procedure: INCISION AND DRAINAGE OF ABDOMINAL WOUND SEROMA;  Surgeon: Abdul Hodgkin, MD;  Location: [facility];  Service: Gynecology;  Laterality: N/A;   IR IMAGING GUIDED PORT INSERTION   09/06/2022   OMENTECTOMY N/A 12/12/2022    Procedure: OMENTECTOMY;  Surgeon: Suzi Essex, MD;  Location: [facility];  Service: Gynecology;   Laterality: N/A;   PARTIAL HYSTERECTOMY        abdominal, fibroids, and prolapse (1998) bladder tack   SALPINGOOPHORECTOMY Bilateral 12/12/2022    Procedure: DIAGNOSTIC LAPAROSCOPY;  Surgeon: Suzi Essex, MD;  Location: [facility];  Service: Gynecology;  Laterality: Bilateral;   SKIN CANCER EXCISION       UPPER GASTROINTESTINAL ENDOSCOPY                        Family History  Problem Relation Age of Onset   Heart attack Mother 50   Transient ischemic attack Mother     Diabetes type II Mother     Sudden death Mother          died age 98   Diabetes Mother     Coronary artery disease Mother     Skin cancer Mother     Barrett's esophagus Mother     Sudden death Father 50        "coronary arteriosclerosis" on death certificate   Breast cancer Sister          dx in her 21s   Colon polyps Sister          adenomatous   Arrhythmia Brother     Colon cancer Brother 88   Congestive Heart Failure Maternal Grandmother     Prostate cancer Maternal Grandfather          dx in 9s   Tuberculosis Paternal Grandfather     Ehlers-Danlos syndrome Daughter     Colon cancer Son 10        lynch syndrome   Other Son          parotid gland tumor   Skin cancer Maternal Aunt     Barrett's esophagus Maternal Aunt     Melanoma Maternal Uncle     Colon cancer Cousin          mid 81s; maternal cousin   Colon cancer Other          MGMs brother   Esophageal cancer Neg Hx     Stomach cancer Neg Hx     Rectal cancer Neg Hx     Uterine cancer Neg Hx     Bladder Cancer Neg Hx             Social History             Socioeconomic History   Marital status: Married      Spouse name: Not on file   Number of children: 3   Years of education: Not on file   Highest education level: Not on file  Occupational History   Occupation:  veterinarian      Comment: retired 2009      Employer: unemployed  Tobacco Use   Smoking status: Never   Smokeless tobacco: Never  Vaping Use   Vaping status:  Never Used  Substance and Sexual Activity   Alcohol use: No      Alcohol/week: 0.0 standard drinks of alcohol      Comment: rarely beer   Drug use: No   Sexual activity: Not Currently  Other Topics Concern   Not on file  Social History Narrative    Retired International aid/development worker, 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)    Grown children    Never smoker no alcohol tobacco or drug use    Social Drivers of Barista Strain: Low Risk  (08/28/2022)    Overall Financial Resource Strain (CARDIA)     Difficulty of Paying Living Expenses: Not hard at all  Food Insecurity: No Food Insecurity (01/10/2023)    Hunger Vital Sign     Worried About Running Out of Food in the Last Year: Never true     Ran Out of Food in the Last Year: Never true  Transportation Needs: No Transportation Needs (01/10/2023)    PRAPARE - Therapist, art (Medical): No     Lack of Transportation (Non-Medical): No  Physical Activity: Inactive (08/28/2022)    Exercise Vital Sign     Days of Exercise per Week: 0 days     Minutes of Exercise per Session: 0 min  Stress: No Stress Concern Present (08/28/2022)    Rachel Carlson of Occupational Health - Occupational Stress Questionnaire     Feeling of Stress : Not at all  Social Connections: Moderately Integrated (08/28/2022)    Social Connection and Isolation Panel [NHANES]     Frequency of Communication with Friends and Family: More than three times a week     Frequency of Social Gatherings with Friends and Family: More than three times a week     Attends Religious Services: Never     Database administrator or Organizations: Yes     Attends Engineer, structural: More than 4 times per year     Marital Status: Married      Current Medications:   Current Medications    Current Facility-Administered Medications:    acetaminophen (TYLENOL) tablet 650 mg, 650 mg, Oral, Q4H PRN, Rachel Kunath D, NP   enoxaparin  (LOVENOX) injection 40 mg, 40 mg, Subcutaneous, Q24H, Rachel Wearing D, NP   ondansetron (ZOFRAN) tablet 4 mg, 4 mg, Oral, Q6H PRN **OR** ondansetron (ZOFRAN) injection 4 mg, 4 mg, Intravenous, Q6H PRN, Rachel Carlson D, NP   oxyCODONE (Oxy IR/ROXICODONE) immediate release tablet 5 mg, 5 mg, Oral, Q4H PRN, Rachel Wisher D, NP   traMADol (ULTRAM) tablet 50 mg, 50 mg, Oral, Q6H PRN, Rachel Carlson, Rachel Ager D, NP      Review of Symptoms: Pertinent positives as per interval   Physical Exam (performed with Dr. Orvil Carlson): Alert, oriented, in no acute distress. Sutures present on left face with ecchymosis from recent dermatology procedure. Breathing unlabored. Lungs clear. Heart rate in regular rate and rhythm. Active bowel sounds, gurguling with some tinkling. Abdomen soft, moderately distended. Ostomy appliance intact. Soft brown stool in the bag. Stoma is red/pink throughout (viable), prolapse remains but less than last assessment. Stoma is significantly less edematous, scatter granulomas remain.     No  lower extremity edema noted. Right port accessed.    Laboratory & Radiologic Studies: CBC Labs (Brief)              Component Value Date/Time    WBC 4.9 04/26/2023 1043    WBC 4.2 04/22/2023 1346    RBC 3.61 (L) 04/26/2023 1043    HGB 11.0 (L) 04/26/2023 1043    HCT 33.2 (L) 04/26/2023 1043    PLT 199 04/26/2023 1043    MCV 92.0 04/26/2023 1043    MCH 30.5 04/26/2023 1043    MCHC 33.1 04/26/2023 1043    RDW 16.5 (H) 04/26/2023 1043    LYMPHSABS 0.9 04/26/2023 1043    MONOABS 0.7 04/26/2023 1043    EOSABS 0.1 04/26/2023 1043    BASOSABS 0.0 04/26/2023 1043     BMET Labs (Brief)              Component Value Date/Time    NA 138 04/26/2023 1043    K 3.8 04/26/2023 1043    CL 106 04/26/2023 1043    CO2 27 04/26/2023 1043    GLUCOSE 102 (H) 04/26/2023 1043    BUN 12 04/26/2023 1043    CREATININE 0.64 04/26/2023 1043    CALCIUM 9.6 04/26/2023 1043    GFRNONAA >60 04/26/2023 1043         Assessment & Plan: Rachel Carlson is a 78 y.o. woman currently admitted with nausea/emesis with imaging concerning for early or intermittent small bowel obstruction with transition at the level of prior ileocolic anastomosis. 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 Now s/p 4 cycles of adj C/T (last on 03/25/23). Imaging on 2/17 with stable diffuse residual peritoneal thickening without nodularity. Moderate loculated left pleural effusion, trace right pleural effusion. She has been started on Zejula under the care of Dr. Cheree Cords at [facility].   Imaging findings discussed with patient by Dr. Orvil Carlson. On imaging review, disease status appears stable. Bowel findings discussed. NG tube has been placed with abdominal xray just completed-results of NG location pending. Recommend bowel rest and IV hydration. When nausea improves, can undergo NG clamping trial. When tolerating PO, can begin bowel regimen. Dr. Elvan Hamel with [facility] seeing patient at this time as well. Continue with current plan of care.

## 2023-05-16 NOTE — Telephone Encounter (Signed)
 Rachel Carlson called office today stating she is headed to the ER at [facility] because she has been vomiting,4-5 times, since 3:00am. No fever/chills, reporting when "I try to wet my whistle" it causes nausea, and she vomits. Not having diarrhea.   Pt aware I will forward to provider.

## 2023-05-16 NOTE — Consult Note (Signed)
 Consult Note  Rachel Carlson April 22, 1945  [id number].    Requesting MD: Dr. Freida Busman Chief Complaint/Reason for Consult: SBO  HPI:  78 y.o. 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  She states that she was able to eat dinner last night and went to bed without issues.  At 2:30 AM she developed nausea, vomiting, abdominal pain which has not improved.  She last emptied her colostomy bag last night and has only had a small amount of output since then.  She has not noticed any flatus.  She denies hematemesis, hematochezia, melena.  She denies shortness of breath, cough, fever.  Workup in the ED significant for CT scan showing early/intermittent SBO with transition point near the ileocolic anastomosis.  She has been admitted to the hospitalist service and general surgery asked to consult.  Nasogastric tube has been placed.  She has history of diagnostic laparoscopy, conversion to exploratory laparotomy with radical tumor debulking including total omentectomy, resection of peritoneal disease, small bowel resection with reanastomosis, en bloc resection of the left fallopian tube and ovary as well as rectosigmoid colon, left ureterolysis, right salpingo-oophorectomy, end descending colostomy, cystoscopy 11/2022 by Dr. Pricilla Holm  At time of my visit she is still nauseous and has vomited once since NGT placement.  Awaiting postplacement film.  Her abdominal pain is minimally improved.  She tells me she takes MiraLAX twice daily for constipation management and has been taking this consistently.  She has history of colostomy prolapse for which she was seen by Dr. Luisa Hart from our service in  March.  This has been improving.  She is accompanied by her son.  Substance use: Denies Blood thinners: Eliquis last dose 4/16 p.m. Past Surgeries: Ectopic pregnancy surgery in her 30s.  Otherwise as above   ROS: Reviewed and as above  Family History  Problem Relation Age of Onset   Heart attack Mother 29   Transient ischemic attack Mother    Diabetes type II Mother    Sudden death Mother        died age 15   Diabetes Mother    Coronary artery disease Mother    Skin cancer Mother    Barrett's esophagus Mother    Sudden death Father 39       "coronary arteriosclerosis" on death certificate   Breast cancer Sister        dx in her 39s   Colon polyps Sister        adenomatous   Arrhythmia Brother    Colon cancer Brother 41   Congestive Heart Failure Maternal Grandmother    Prostate cancer Maternal Grandfather        dx in 41s   Tuberculosis Paternal Grandfather    Ehlers-Danlos syndrome Daughter    Colon cancer Son 65       lynch syndrome   Other Son        parotid gland tumor   Skin cancer Maternal Aunt    Barrett's esophagus Maternal Aunt    Melanoma Maternal Uncle    Colon cancer Cousin        mid 58s; maternal cousin   Colon cancer Other        MGMs brother   Esophageal cancer Neg Hx    Stomach cancer Neg  Hx    Rectal cancer Neg Hx    Uterine cancer Neg Hx    Bladder Cancer Neg Hx     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)    Esophageal stricture    Family history of breast cancer    Family history of colon cancer    Family history of melanoma    Family history of prostate cancer    Fibroids 1998   uterine, history of (left ovaries)   GERD (gastroesophageal reflux disease)    History of hiatal hernia    History of uterine prolapse    Hx of adenomatous polyp of colon 10/19/2014   Hyperlipidemia    Hypertension     borderline no meds diet and exercise   Neuromuscular disorder (HCC)    neuropathy from chemo   Obesity    Ovarian cancer (HCC)    PVC (premature ventricular contraction)    first dx by holter in 1980's, echo (6/10) EF 55-60%, normal diastolic fxn, normal size RV and fxn, mild MR, PASP   Squamous cell carcinoma of arm    ovarian   Tubal pregnancy    Ulcerative proctosigmoiditis (HCC)    Urge urinary incontinence 06/03/2007   Qualifier: Diagnosis of  By: Lennice Quivers CMA, Kelly Patient      Past Surgical History:  Procedure Laterality Date   BILATERAL SALPINGECTOMY Bilateral 12/12/2022   Procedure: OPEN BILATERAL SALPINGECTOMY-OOPHORECTOMY;  Surgeon: Suzi Essex, MD;  Location: [facility];  Service: Gynecology;  Laterality: Bilateral;   BLADDER NECK SUSPENSION  1998   BREAST BIOPSY Left    BREAST LUMPECTOMY Left    benign   CATARACT EXTRACTION Right 01/2020   CATARACT EXTRACTION W/PHACO Left 08/12/2020   Procedure: CATARACT EXTRACTION PHACO AND INTRAOCULAR LENS PLACEMENT LEFT EYE;  Surgeon: Tarri Farm, MD;  Location: [facility];  Service: Ophthalmology;  Laterality: Left;  left CDE=10.71   COLONOSCOPY  05/18/2011/10/12/14   Dr. Loy Ruff   DEBULKING N/A 12/12/2022   Procedure: RADICAL TUMOR DEBULKING, PERITONEAL STRIPPING, MOBILIZATION SPLENIC FLEXURE, RECTOSIGMOID RESECTION,SMALL BOWEL RESECTION AND REANASTOMOSIS ,END TO END DESCENDING COLOSTOMY;  Surgeon: Suzi Essex, MD;  Location: [facility];  Service: Gynecology;  Laterality: N/A;   ECTOPIC PREGNANCY SURGERY  1980   ESOPHAGOGASTRODUODENOSCOPY (EGD) WITH ESOPHAGEAL DILATION  04/03/2012   Dr. Loy Ruff   INCISION AND DRAINAGE ABSCESS N/A 01/02/2023   Procedure: INCISION AND DRAINAGE OF ABDOMINAL WOUND SEROMA;  Surgeon: Abdul Hodgkin, MD;  Location: [facility];  Service: Gynecology;  Laterality: N/A;   IR IMAGING GUIDED PORT INSERTION  09/06/2022   OMENTECTOMY N/A 12/12/2022   Procedure: OMENTECTOMY;  Surgeon:  Suzi Essex, MD;  Location: [facility];  Service: Gynecology;  Laterality: N/A;   PARTIAL HYSTERECTOMY     abdominal, fibroids, and prolapse (1998) bladder tack   SALPINGOOPHORECTOMY Bilateral 12/12/2022   Procedure: DIAGNOSTIC LAPAROSCOPY;  Surgeon: Suzi Essex, MD;  Location: [facility];  Service: Gynecology;  Laterality: Bilateral;   SKIN CANCER EXCISION     UPPER GASTROINTESTINAL ENDOSCOPY      Social History:  reports that she has never smoked. She has never used smokeless tobacco. She reports that she does not drink alcohol and does not use drugs.  Allergies:  Allergies  Allergen Reactions   Aspartame     Intolerance to artifical sweeteners, pt states caused  burning of bladder   Levaquin [Levofloxacin] Nausea Only   Pantoprazole Sodium Diarrhea    Abdominal pain   Ranitidine Diarrhea    abdominal pain   Omeprazole Diarrhea    Abdominal pain  Other Reaction(s): Not available   Sulfasalazine Rash and Other (See Comments)    Fever, chills, headache, muscle pain also    Medications Prior to Admission  Medication Sig Dispense Refill   apixaban (ELIQUIS) 5 MG TABS tablet Take 1 tablet by mouth twice daily 180 tablet 0   Ascorbic Acid (VITAMIN C) 1000 MG tablet Take 1,000 mg by mouth daily.     calcium citrate-vitamin D (CITRACAL+D) 315-200 MG-UNIT per tablet Take 1 tablet by mouth 2 (two) times daily.     Cholecalciferol (VITAMIN D-3) 5000 UNITS TABS Take 5,000 Units by mouth daily.     Coenzyme Q10 (CO Q-10) 200 MG CAPS Take 200 mg by mouth daily.     CRANBERRY PO Take 650 mg by mouth daily.     Glucosamine-Chondroitin (GLUCOSAMINE CHONDR COMPLEX PO) Take 2 capsules by mouth daily.     GRAPE SEED EXTRACT PO Take 1 capsule by mouth daily.     ketoconazole (NIZORAL) 2 % cream Apply 1 Application topically daily. To yeast rash 30 g 1   lidocaine-prilocaine (EMLA) cream Apply topically daily.     meclizine (ANTIVERT) 25 MG tablet TAKE 1 TABLET BY MOUTH THREE TIMES DAILY  AS NEEDED FOR NAUSEA OR DIZZINESS 20 tablet 0   milk thistle 175 MG tablet Take 175 mg by mouth daily.     mirtazapine (REMERON) 15 MG tablet Take 1 tablet (15 mg total) by mouth at bedtime. 30 tablet 4   niraparib tosylate (ZEJULA) 200 MG tablet Take 1 tablet (200 mg total) by mouth daily. May take at bedtime to reduce nausea and vomiting. 30 tablet 1   Omega-3 Fatty Acids (FISH OIL TRIPLE STRENGTH PO) Take 1 capsule by mouth daily.     Polyethyl Glycol-Propyl Glycol (SYSTANE OP) Place 1 drop into both eyes 4 (four) times daily as needed (for dryness).     PREVACID 24HR 15 MG capsule Take 15 mg by mouth See admin instructions. Take 15 mg by mouth in the morning before any food and an additional 15 mg once a day as needed for unresolved reflux     prochlorperazine (COMPAZINE) 10 MG tablet Take 10 mg by mouth every 6 (six) hours as needed for nausea or vomiting.     rosuvastatin (CRESTOR) 20 MG tablet Take 1 tablet (20 mg total) by mouth at bedtime. 90 tablet 3   temazepam (RESTORIL) 30 MG capsule Take 1 capsule (30 mg total) by mouth at bedtime as needed for sleep. 30 capsule 0   vitamin B-12 (CYANOCOBALAMIN) 1000 MCG tablet Take 1,000 mcg by mouth daily.     zinc gluconate 50 MG tablet Take 50 mg by mouth daily.      Blood pressure 123/76, pulse 89, temperature 98.8 F (37.1 C), resp. rate 19, height 5\' 2"  (1.575 m), weight 72.6 kg, last menstrual period 05/29/1996, SpO2 95%. Physical Exam: General: pleasant, WD, female who is laying in bed in NAD HEENT: head is normocephalic, atraumatic.  Sclera are noninjected.  Pupils equal and round. EOMs intact.  Ears and nose without any masses or lesions.  Mouth is pink and moist Lungs:  Respiratory effort nonlabored Abd: soft, mild to moderate distention.  Diffusely TTP without peritonitis.  Greatest in central abdomen.  Colostomy with soft stool in  bag and stoma only partially visible due to stool. MSK: all 4 extremities are symmetrical with no  cyanosis, clubbing, or edema. Skin: warm and dry with no masses, lesions, or rashes Neuro: Cranial nerves 2-12 grossly intact, sensation is normal throughout Psych: A&Ox3 with an appropriate affect.    Results for orders placed or performed during the hospital encounter of 05/16/23 (from the past 48 hours)  Lipase, blood     Status: None   Collection Time: 05/16/23 10:31 AM  Result Value Ref Range   Lipase 24 11 - 51 U/L    Comment: Performed at [facility], [address], [place], [place] [address]  Comprehensive metabolic panel     Status: Abnormal   Collection Time: 05/16/23 10:31 AM  Result Value Ref Range   Sodium 136 135 - 145 mmol/L   Potassium 3.1 (L) 3.5 - 5.1 mmol/L   Chloride 107 98 - 111 mmol/L   CO2 19 (L) 22 - 32 mmol/L   Glucose, Bld 136 (H) 70 - 99 mg/dL    Comment: Glucose reference range applies only to samples taken after fasting for at least 8 hours.   BUN 10 8 - 23 mg/dL   Creatinine, Ser 1.91 0.44 - 1.00 mg/dL   Calcium 8.4 (L) 8.9 - 10.3 mg/dL   Total Protein 6.3 (L) 6.5 - 8.1 g/dL   Albumin 3.5 3.5 - 5.0 g/dL   AST 19 15 - 41 U/L   ALT 12 0 - 44 U/L   Alkaline Phosphatase 72 38 - 126 U/L   Total Bilirubin 0.9 0.0 - 1.2 mg/dL   GFR, Estimated >47 >82 mL/min    Comment: (NOTE) Calculated using the CKD-EPI Creatinine Equation (2021)    Anion gap 10 5 - 15    Comment: Performed at [facility], [address], [place], [place] [address]  CBC with Differential/Platelet     Status: Abnormal   Collection Time: 05/16/23 10:31 AM  Result Value Ref Range   WBC 8.6 4.0 - 10.5 K/uL   RBC 4.28 3.87 - 5.11 MIL/uL   Hemoglobin 13.4 12.0 - 15.0 g/dL   HCT 30.8 65.7 - 84.6 %   MCV 94.4 80.0 - 100.0 fL   MCH 31.3 26.0 - 34.0 pg   MCHC 33.2 30.0 - 36.0 g/dL   RDW 96.2 (H) 95.2 - 84.1 %   Platelets 236 150 - 400 K/uL   nRBC 0.0 0.0 - 0.2 %   Neutrophils Relative % 85 %   Neutro Abs 7.3 1.7 - 7.7 K/uL   Lymphocytes Relative  5 %   Lymphs Abs 0.4 (L) 0.7 - 4.0 K/uL   Monocytes Relative 9 %   Monocytes Absolute 0.8 0.1 - 1.0 K/uL   Eosinophils Relative 1 %   Eosinophils Absolute 0.0 0.0 - 0.5 K/uL   Basophils Relative 0 %   Basophils Absolute 0.0 0.0 - 0.1 K/uL   Immature Granulocytes 0 %   Abs Immature Granulocytes 0.02 0.00 - 0.07 K/uL    Comment: Performed at [facility], [address] [place], [place] [address]  Magnesium     Status: None   Collection Time: 05/16/23 10:31 AM  Result Value Ref Range   Magnesium 1.9 1.7 - 2.4 mg/dL    Comment: Performed at [facility], [address], [place], [place] [address]  Phosphorus     Status: None   Collection Time: 05/16/23 10:31 AM  Result Value Ref  Range   Phosphorus 3.1 2.5 - 4.6 mg/dL    Comment: Performed at [facility], [address] [place], [place] [address]   CT ABDOMEN PELVIS W CONTRAST Result Date: 05/16/2023 CLINICAL DATA:  Abdominal pain, vomiting, history of ovarian cancer and peritoneal carcinomatosis EXAM: CT ABDOMEN AND PELVIS WITH CONTRAST TECHNIQUE: Multidetector CT imaging of the abdomen and pelvis was performed using the standard protocol following bolus administration of intravenous contrast. RADIATION DOSE REDUCTION: This exam was performed according to the departmental dose-optimization program which includes automated exposure control, adjustment of the mA and/or kV according to patient size and/or use of iterative reconstruction technique. CONTRAST:  OMNIPAQUE IOHEXOL 300 MG/ML  SOLN COMPARISON:  03/18/2023 FINDINGS: Lower chest: Large hiatal hernia. Bilateral pleural effusions, left greater than right. Stable left lower lobe atelectasis. Hepatobiliary: No focal liver abnormality is seen. No gallstones, gallbladder wall thickening, or biliary dilatation. Pancreas: Unremarkable. No pancreatic ductal dilatation or surrounding inflammatory changes. Spleen: Normal in size without  focal abnormality. Adrenals/Urinary Tract: The adrenals are unremarkable. The kidneys enhance normally and symmetrically. No urinary tract calculi or obstructive uropathy. The bladder is decompressed, limiting its evaluation. Stomach/Bowel: Postsurgical changes from distal colon resection and diverting colostomy left upper quadrant. Evidence of prior small bowel resection with ileo colic anastomosis. There is moderate distension of the distal small bowel with scattered gas fluid levels, with jejunum measuring up to 3.1 cm in diameter. There is formed stool within the distal small bowel just proximal to the ileocolic anastomosis. Findings are concerning for early or intermittent small bowel obstruction with transition at the site of ileocolic anastomosis. No bowel wall thickening or acute inflammatory changes. Large hiatal hernia as described above. Vascular/Lymphatic: Aortic atherosclerosis. No pathologic adenopathy within the abdomen or pelvis. Reproductive: Status post hysterectomy. No adnexal masses. Other: Trace free fluid within the right upper quadrant and lower pelvis. No free intraperitoneal gas. Stable postsurgical changes from midline laparotomy. Mild diffuse peritoneal thickening again identified, grossly stable. No distinct mesenteric nodules or masses. Musculoskeletal: No acute or destructive bony abnormalities. Reconstructed images demonstrate no additional findings. IMPRESSION: 1. Findings concerning for early or intermittent small bowel obstruction with transition at the level of prior ileocolic anastomosis. Continued radiographic follow-up recommended. 2. Bilateral pleural effusions, left greater than right, with stable left lower lobe atelectasis. 3. Stable mild diffuse peritoneal thickening without discrete nodularity or masses. 4. Trace abdominal and pelvic ascites. 5.  Aortic Atherosclerosis (ICD10-I70.0). 6. Stable large hiatal hernia. Electronically Signed   By: Bobbye Burrow M.D.   On:  05/16/2023 14:20      Assessment/Plan SBO Peritoneal carcinomatosis on chemotherapy History of ex lap with radical tumor debulking including total omentectomy, resection of peritoneal disease, SBR with reanastomosis, en bloc resection of the left fallopian tube and ovary as well as rectosigmoid colon, left ureterolysis, right salpingo-oophorectomy, end descending colostomy 11/2022 by Dr. Orvil Bland  CT scan concerning for early or intermittent small bowel obstruction with transition at the level of the prior ileocolic anastomosis  - No current indication for emergency surgery.  She is hemodynamically stable, afebrile with normal WBC.  She is tender on exam but not peritonitic.  She has high risk for surgical intervention given her peritoneal carcinomatosis on chemotherapy. - Place NGT for decompression and keep NPO - Start SBO protocol  - Keep K > 4 and Mg > 2 for bowel function - Mobilize for bowel function - Hopefully patient will improve with conservative management. If patient fails to improve with  conservative management, he may require exploratory surgery during admission - Agree with medical admission. We will follow with you.    FEN: N.p.o./NGT LIWS ID: None VTE: Please hold Eliquis.  Okay for heparin drip if clinically indicated   I reviewed ED provider notes, hospitalist notes, last 24 h vitals and pain scores, last 48 h intake and output, last 24 h labs and trends, and last 24 h imaging results.   Elwin Hammond, [facility] Surgery 05/16/2023, 4:06 PM Please see Amion for pager number during day hours 7:00am-4:30pm

## 2023-05-16 NOTE — ED Triage Notes (Addendum)
 Patient has vomited 4 times since 3am today. Feels spasms in her abdomen when she vomits. No blood in the vomit. Has an ostomy bag. Takes oral chemo for peritoneal carcinoma.

## 2023-05-17 ENCOUNTER — Inpatient Hospital Stay (HOSPITAL_COMMUNITY)

## 2023-05-17 DIAGNOSIS — K56600 Partial intestinal obstruction, unspecified as to cause: Secondary | ICD-10-CM | POA: Diagnosis not present

## 2023-05-17 DIAGNOSIS — R112 Nausea with vomiting, unspecified: Secondary | ICD-10-CM | POA: Diagnosis not present

## 2023-05-17 DIAGNOSIS — C482 Malignant neoplasm of peritoneum, unspecified: Secondary | ICD-10-CM | POA: Diagnosis not present

## 2023-05-17 LAB — URINALYSIS, ROUTINE W REFLEX MICROSCOPIC
Bacteria, UA: NONE SEEN
Bilirubin Urine: NEGATIVE
Bilirubin Urine: NEGATIVE
Glucose, UA: NEGATIVE mg/dL
Glucose, UA: NEGATIVE mg/dL
Hgb urine dipstick: NEGATIVE
Ketones, ur: 20 mg/dL — AB
Ketones, ur: NEGATIVE mg/dL
Leukocytes,Ua: NEGATIVE
Leukocytes,Ua: NEGATIVE
Nitrite: NEGATIVE
Nitrite: NEGATIVE
Protein, ur: 100 mg/dL — AB
Protein, ur: NEGATIVE mg/dL
RBC / HPF: >50 RBC/hpf (ref 0–5)
Specific Gravity, Urine: 1.024 (ref 1.005–1.030)
Specific Gravity, Urine: 1.038 — ABNORMAL HIGH (ref 1.005–1.030)
pH: 5 (ref 5.0–8.0)
pH: 5 (ref 5.0–8.0)

## 2023-05-17 LAB — CBC WITH DIFFERENTIAL/PLATELET
Abs Immature Granulocytes: 0 10*3/uL (ref 0.00–0.07)
Basophils Absolute: 0 10*3/uL (ref 0.0–0.1)
Basophils Relative: 1 %
Eosinophils Absolute: 0.1 10*3/uL (ref 0.0–0.5)
Eosinophils Relative: 3 %
HCT: 34.1 % — ABNORMAL LOW (ref 36.0–46.0)
Hemoglobin: 11.1 g/dL — ABNORMAL LOW (ref 12.0–15.0)
Immature Granulocytes: 0 %
Lymphocytes Relative: 23 %
Lymphs Abs: 0.7 10*3/uL (ref 0.7–4.0)
MCH: 31.7 pg (ref 26.0–34.0)
MCHC: 32.6 g/dL (ref 30.0–36.0)
MCV: 97.4 fL (ref 80.0–100.0)
Monocytes Absolute: 0.6 10*3/uL (ref 0.1–1.0)
Monocytes Relative: 22 %
Neutro Abs: 1.5 10*3/uL — ABNORMAL LOW (ref 1.7–7.7)
Neutrophils Relative %: 51 %
Platelets: 186 10*3/uL (ref 150–400)
RBC: 3.5 MIL/uL — ABNORMAL LOW (ref 3.87–5.11)
RDW: 17.4 % — ABNORMAL HIGH (ref 11.5–15.5)
WBC: 2.9 10*3/uL — ABNORMAL LOW (ref 4.0–10.5)
nRBC: 0 % (ref 0.0–0.2)

## 2023-05-17 LAB — COMPREHENSIVE METABOLIC PANEL WITH GFR
ALT: 11 U/L (ref 0–44)
AST: 13 U/L — ABNORMAL LOW (ref 15–41)
Albumin: 3 g/dL — ABNORMAL LOW (ref 3.5–5.0)
Alkaline Phosphatase: 63 U/L (ref 38–126)
Anion gap: 7 (ref 5–15)
BUN: 14 mg/dL (ref 8–23)
CO2: 23 mmol/L (ref 22–32)
Calcium: 8.4 mg/dL — ABNORMAL LOW (ref 8.9–10.3)
Chloride: 106 mmol/L (ref 98–111)
Creatinine, Ser: 0.59 mg/dL (ref 0.44–1.00)
GFR, Estimated: >60 mL/min (ref >60)
Glucose, Bld: 117 mg/dL — ABNORMAL HIGH (ref 70–99)
Potassium: 3.9 mmol/L (ref 3.5–5.1)
Sodium: 136 mmol/L (ref 135–145)
Total Bilirubin: 1 mg/dL (ref 0.0–1.2)
Total Protein: 5.8 g/dL — ABNORMAL LOW (ref 6.5–8.1)

## 2023-05-17 LAB — CBC
HCT: 35.8 % — ABNORMAL LOW (ref 36.0–46.0)
Hemoglobin: 11.3 g/dL — ABNORMAL LOW (ref 12.0–15.0)
MCH: 31 pg (ref 26.0–34.0)
MCHC: 31.6 g/dL (ref 30.0–36.0)
MCV: 98.1 fL (ref 80.0–100.0)
Platelets: 188 10*3/uL (ref 150–400)
RBC: 3.65 MIL/uL — ABNORMAL LOW (ref 3.87–5.11)
RDW: 17.4 % — ABNORMAL HIGH (ref 11.5–15.5)
WBC: 2.9 10*3/uL — ABNORMAL LOW (ref 4.0–10.5)
nRBC: 0 % (ref 0.0–0.2)

## 2023-05-17 MED ORDER — PHENOL 1.4 % MT LIQD
1.0000 | OROMUCOSAL | Status: DC | PRN
  Filled 2023-05-17: qty 177

## 2023-05-17 MED ORDER — CHLORHEXIDINE GLUCONATE CLOTH 2 % EX PADS
6.0000 | MEDICATED_PAD | Freq: Every day | CUTANEOUS | Status: DC
  Administered 2023-05-17 – 2023-06-01 (×16): 6 via TOPICAL

## 2023-05-17 MED ORDER — SODIUM CHLORIDE 0.9% FLUSH
10.0000 mL | INTRAVENOUS | Status: DC | PRN
  Administered 2023-05-21: 10 mL

## 2023-05-17 MED ORDER — BENZOCAINE 10 % MT GEL
Freq: Four times a day (QID) | OROMUCOSAL | Status: DC | PRN
  Filled 2023-05-17: qty 9.4

## 2023-05-17 NOTE — Progress Notes (Signed)
 PROGRESS NOTE    Rachel Carlson  ZOX:[id number]  DOB: 12/24/1945  DOA: 05/16/2023 PCP: Clemens Curt, MD Outpatient Specialists:   Hospital course:  78 year old female with ovarian cancer and carcinomatosis s/p ex lap with tumor debulking, total omentectomy, SBR with 3 anastomosis, resection of ovary and rectosigmoid., DVT, GERD admitted last night for possible partial small bowel obstruction with probable transition point at the level of colonic anastomosis.  Patient was started on conservative management with bowel rest via NG tube   Subjective:  Patient's main concern is severe throat pain when she swallows.  Notes that the tube is going down the right side but the pain is on the left side.  Notes she is more comfortable than yesterday, no longer vomiting.   Objective: Vitals:   05/17/23 0126 05/17/23 0553 05/17/23 0929 05/17/23 1531  BP: 113/65 110/63 118/65 (!) 140/77  Pulse: 92 94 72 94  Resp: 19 18 19 19   Temp: 99.2 F (37.3 C) 98.5 F (36.9 C) 98.4 F (36.9 C) 98.8 F (37.1 C)  TempSrc: Oral Oral Oral Oral  SpO2: 94% 95% 94% 94%  Weight:      Height:        Intake/Output Summary (Last 24 hours) at 05/17/2023 1646 Last data filed at 05/17/2023 8119 Gross per 24 hour  Intake 1601.1 ml  Output 1850 ml  Net -248.9 ml   Filed Weights   05/16/23 1004  Weight: 72.6 kg     Exam:  General: Patient with somewhat masked facies sitting up straight in bed with NG tube in place with no acute obvious distress Eyes: sclera anicteric, conjuctiva mild injection bilaterally CVS: S1-S2, regular  Respiratory:  decreased air entry bilaterally secondary to decreased inspiratory effort, rales at bases  GI: Firm but not hard, slightly distended, no tenderness to light palpation LE: Warm and well-perfused Neuro: A/O x 3,  grossly nonfocal.  Psych: patient is logical and coherent, judgement and insight appear normal, mood and affect appropriate to situation.  Data  Reviewed:  Basic Metabolic Panel: Recent Labs  Lab 05/13/23 0919 05/16/23 1031 05/17/23 0311  NA 136 136 136  K 3.9 3.1* 3.9  CL 102 107 106  CO2 23 19* 23  GLUCOSE 99 136* 117*  BUN 13 10 14   CREATININE 0.59 0.51 0.59  CALCIUM  9.4 8.4* 8.4*  MG 1.9 1.9  --   PHOS  --  3.1  --     CBC: Recent Labs  Lab 05/13/23 0919 05/16/23 1031 05/17/23 0311 05/17/23 1020  WBC 3.5* 8.6 2.9* 2.9*  NEUTROABS 2.1 7.3  --  1.5*  HGB 11.0* 13.4 11.3* 11.1*  HCT 33.9* 40.4 35.8* 34.1*  MCV 95.2 94.4 98.1 97.4  PLT 189 236 188 186     Scheduled Meds:  Chlorhexidine  Gluconate Cloth  6 each Topical Daily   enoxaparin  (LOVENOX ) injection  40 mg Subcutaneous Q24H   metoprolol  tartrate  5 mg Intravenous Q12H   Continuous Infusions:  sodium chloride  100 mL/hr at 05/17/23 0919     Assessment & Plan:   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  HTN Can restart home BP meds when able to take p.o. At present controlled on metoprolol  5 IVP twice daily    DVT prophylaxis: Lovenox  Code Status: Full Family Communication: None today     Studies: DG Abd Portable 1V-Small Bowel Obstruction Protocol-initial, 8 hr delay Result Date: 05/17/2023 CLINICAL DATA:  Small-bowel follow-through. EXAM: PORTABLE ABDOMEN - 1 VIEW COMPARISON:  05/16/2023 FINDINGS: NG tube is folded back on itself in the patient's known hiatal hernia. Distal tip of the NG tube is not been included on the film. Gaseous small bowel distention in the abdomen measures up to 4 cm diameter. There is evidence  of gas and stool in the left colon without substantial colonic dilatation. No discernible enteric contrast within the small bowel or colon. IMPRESSION: 1. Gaseous small bowel distention in the abdomen measures up to 4 cm diameter. Imaging features are compatible with small bowel obstruction. 2. No discernible enteric contrast within the small bowel or colon. 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 Electronically Signed   By: Donnal Fusi M.D.   On: 05/17/2023 07:53   DG Abd Portable 1V Result Date: 05/16/2023 CLINICAL DATA:  Check gastric catheter placement EXAM: PORTABLE ABDOMEN - 1 VIEW COMPARISON:  None Available. FINDINGS: Scattered large and small bowel gas is noted. Gastric catheter is coiled within a hiatal hernia just above the esophageal hiatus. Chest wall port is noted. Lung bases demonstrate minimal atelectasis on the left. IMPRESSION: Gastric catheter coiled within a hiatal hernia. Electronically Signed   By: Violeta Grey M.D.   On: 05/16/2023 19:15   CT ABDOMEN PELVIS W CONTRAST Result Date: 05/16/2023 CLINICAL DATA:  Abdominal pain, vomiting, history of ovarian cancer and peritoneal carcinomatosis EXAM: CT ABDOMEN AND PELVIS WITH CONTRAST TECHNIQUE: Multidetector CT imaging of the abdomen and pelvis was performed using the standard protocol following bolus administration of intravenous contrast. RADIATION DOSE REDUCTION: This exam was performed according to the departmental dose-optimization program which includes automated exposure control, adjustment of the mA and/or kV according to patient size and/or use of iterative reconstruction technique. CONTRAST:  OMNIPAQUE  IOHEXOL  300 MG/ML  SOLN COMPARISON:  03/18/2023 FINDINGS: Lower chest: Large hiatal hernia. Bilateral pleural effusions, left greater than right. Stable left lower lobe atelectasis. Hepatobiliary: No focal  liver abnormality is seen. No gallstones, gallbladder wall thickening, or biliary dilatation. Pancreas: Unremarkable. No pancreatic ductal dilatation or surrounding inflammatory changes. Spleen: Normal in size without focal abnormality. Adrenals/Urinary Tract: The adrenals are unremarkable. The kidneys enhance normally and symmetrically. No urinary tract calculi or obstructive uropathy. The bladder is decompressed, limiting its evaluation. Stomach/Bowel: Postsurgical changes from distal colon resection and diverting colostomy left upper quadrant. Evidence of prior small bowel resection with ileo colic anastomosis. There is moderate distension of the distal small bowel with scattered gas fluid levels, with jejunum measuring up to 3.1 cm in diameter. There is formed stool within the distal small bowel just proximal to the ileocolic anastomosis. Findings are concerning for early or intermittent small bowel obstruction with transition at the site of ileocolic anastomosis. No bowel wall thickening or acute inflammatory changes. Large hiatal hernia as described above. Vascular/Lymphatic: Aortic atherosclerosis. No pathologic adenopathy within the abdomen or pelvis. Reproductive: Status post hysterectomy. No adnexal masses. Other: Trace free fluid within the right upper quadrant and lower pelvis. No free intraperitoneal gas. Stable postsurgical changes from midline laparotomy.  Mild diffuse peritoneal thickening again identified, grossly stable. No distinct mesenteric nodules or masses. Musculoskeletal: No acute or destructive bony abnormalities. Reconstructed images demonstrate no additional findings. IMPRESSION: 1. Findings concerning for early or intermittent small bowel obstruction with transition at the level of prior ileocolic anastomosis. Continued radiographic follow-up recommended. 2. Bilateral pleural effusions, left greater than right, with stable left lower lobe atelectasis. 3. Stable mild diffuse peritoneal  thickening without discrete nodularity or masses. 4. Trace abdominal and pelvic ascites. 5.  Aortic Atherosclerosis (ICD10-I70.0). 6. Stable large hiatal hernia. Electronically Signed   By: Bobbye Burrow M.D.   On: 05/16/2023 14:20    Principal Problem:   Partial small bowel obstruction (HCC) Active Problems:   Hyperlipidemia   GERD (gastroesophageal reflux disease)   Prediabetes   Essential hypertension   Iron deficiency anemia   Hypokalemia     Naoma Boxell Rollene Clink, [facility]  If 7PM-7AM, please contact night-coverage [email or web address]   LOS: 1 day

## 2023-05-17 NOTE — Progress Notes (Signed)
   05/17/23 1320  TOC Brief Assessment  Insurance and Status Reviewed  Patient has primary care physician Yes  Home environment has been reviewed Single family home  Prior level of function: Independent  Prior/Current Home Services No current home services  Social Drivers of Health Review SDOH reviewed no interventions necessary  Readmission risk has been reviewed Yes  Transition of care needs no transition of care needs at this time

## 2023-05-17 NOTE — Progress Notes (Signed)
 Mobility Specialist - Progress Note   05/17/23 1039  Mobility  Activity Ambulated with assistance in hallway  Level of Assistance Modified independent, requires aide device or extra time  Assistive Device Other (Comment) (IV Pole)  Distance Ambulated (ft) 1000 ft  Activity Response Tolerated well  Mobility Referral Yes  Mobility visit 1 Mobility  Mobility Specialist Start Time (ACUTE ONLY) 1016  Mobility Specialist Stop Time (ACUTE ONLY) 1038  Mobility Specialist Time Calculation (min) (ACUTE ONLY) 22 min   Pt received in bed and agreeable to mobility. No complaints during session. Pt to bed after session with all needs met.    [facility]

## 2023-05-17 NOTE — Plan of Care (Signed)

## 2023-05-17 NOTE — Progress Notes (Signed)
 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  Subjective: Patient reports sleeping overnight. Nausea is almost gone, having occasional twinges but improved. Had ostomy output overnight. Abdomen does not feel bloated to patient. No abdominal pain reported.  Objective: Vital signs in last 24 hours: Temp:  [97.9 F (36.6 C)-100.4 F (38 C)] 98.5 F (36.9 C) (04/18 0553) Pulse Rate:  [89-114] 94 (04/18 0553) Resp:  [18-20] 18 (04/18 0553) BP: (110-144)/(63-83) 110/63 (04/18 0553) SpO2:  [94 %-96 %] 95 % (04/18 0553) Weight:  [160 lb (72.6 kg)] 160 lb (72.6 kg) (04/17 1004) Last BM Date : 05/16/23  Intake/Output from previous day: 04/17 0701 - 04/18 0700 In: 1601.1 [I.V.:1497.5; IV Piggyback:103.6] Out: 1850 [Urine:400; Emesis/NG output:1450]  Physical Examination: General: alert, cooperative, and no distress Resp: clear to auscultation bilaterally Cardio: regular rate and rhythm, S1, S2 normal, no murmur, click, rub or gallop GI: active bowel sounds, decreased from yesterday's assessment, NG tube in place to intermittent suction, abdomen mildly distended, soft, ostomy with moderate amount of loose brown stool and some flatus, stoma remains prolapsed/non-edematous/stoma pink and viable Extremities: extremities normal, atraumatic, no cyanosis or edema  Labs: WBC/Hgb/Hct/Plts:  2.9/11.3/35.8/188 (04/18 0311) BUN/Cr/glu/ALT/AST/amyl/lip:  14/0.59/--/11/13/--/-- (04/18 0311)  Assessment: Rachel Carlson is a 78 y.o. woman currently admitted with nausea/emesis with imaging concerning for early or intermittent small bowel obstruction with transition at the level of prior ileocolic anastomosis. History includes Stage IVB primary  peritoneal carcinoma who is s/p 4 cycles NACT (C/T), IDS (11/2022 - exlap, BSO, rectosigmoid resection with end ostomy, omentectomy, small bowel resection). Now s/p 4 cycles of adj C/T (last on 03/25/23). She has been started on Zejula  under the care of Dr. Cheree Cords at [facility].   Pain:  No pain reported. PRN medications ordered if needed.  Heme: Hgb 11.3 and Hct 35.8 this am. Overall stable. Plan for repeat CBC given difference in yesterday's labs  ID: WBC 2.9. Currently taking niraparib . Plan for repeat CBC given significant difference in WBC count, H&H.  CV: BP and HR overall stable. Continue to monitor while inpatient.  GI:  Tolerating po: no, NG tube in place. Currently admitted with imaging concerning for early or intermittent small bowel obstruction with transition at the level of prior ileocolic anastomosis. Ostomy with output.  GU: Creatinine 0.59 this am. Voiding.    FEN: No critical values on am labs.  Prophylaxis: Lovenox  ordered.  Plan: Continue with bowel rest with NG tube decompression today Continue with IV hydration Repeat CBC ordered per Dr. Orvil Bland  Recommend medication for GI prophylaxis  Continue with current plan of care   LOS: 1 day    [name] D Akera Snowberger 05/17/2023, 7:15 AM

## 2023-05-17 NOTE — Progress Notes (Signed)
 GYN Oncology Progress Note  Attempted to check in with patient. Patient is sleeping deeply in no acute distress, appears comfortable. Not awakening when knocking and opening the door. Will follow up with patient at a later time. Continue with current plan of care.

## 2023-05-17 NOTE — Progress Notes (Signed)
 Subjective/Chief Complaint: No nausea or vomiting Large ostomy output overnight Does not feel distended NG is functioning Plain films - no contrast in SB or colon; some gastrografin  in ostomy bag   Objective: Vital signs in last 24 hours: Temp:  [97.9 F (36.6 C)-100.4 F (38 C)] 98.5 F (36.9 C) (04/18 0553) Pulse Rate:  [89-114] 94 (04/18 0553) Resp:  [18-20] 18 (04/18 0553) BP: (110-144)/(63-83) 110/63 (04/18 0553) SpO2:  [94 %-96 %] 95 % (04/18 0553) Weight:  [72.6 kg] 72.6 kg (04/17 1004) Last BM Date : 05/16/23  Intake/Output from previous day: 04/17 0701 - 04/18 0700 In: 1601.1 [I.V.:1497.5; IV Piggyback:103.6] Out: 1850 [Urine:400; Emesis/NG output:1450] Intake/Output this shift: No intake/output data recorded.  Abd - soft, non-tender Ostomy with brown pasty stool  Lab Results:  Recent Labs    05/16/23 1031 05/17/23 0311  WBC 8.6 2.9*  HGB 13.4 11.3*  HCT 40.4 35.8*  PLT 236 188   BMET Recent Labs    05/16/23 1031 05/17/23 0311  NA 136 136  K 3.1* 3.9  CL 107 106  CO2 19* 23  GLUCOSE 136* 117*  BUN 10 14  CREATININE 0.51 0.59  CALCIUM  8.4* 8.4*   PT/INR No results for input(s): "LABPROT", "INR" in the last 72 hours. ABG No results for input(s): "PHART", "HCO3" in the last 72 hours.  Invalid input(s): "PCO2", "PO2"  Studies/Results: DG Abd Portable 1V-Small Bowel Obstruction Protocol-initial, 8 hr delay Result Date: 05/17/2023 CLINICAL DATA:  Small-bowel follow-through. EXAM: PORTABLE ABDOMEN - 1 VIEW COMPARISON:  05/16/2023 FINDINGS: NG tube is folded back on itself in the patient's known hiatal hernia. Distal tip of the NG tube is not been included on the film. Gaseous small bowel distention in the abdomen measures up to 4 cm diameter. There is evidence of gas and stool in the left colon without substantial colonic dilatation. No discernible enteric contrast within the small bowel or colon. IMPRESSION: 1. Gaseous small bowel distention in  the abdomen measures up to 4 cm diameter. Imaging features are compatible with small bowel obstruction. 2. No discernible enteric contrast within the small bowel or colon. 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 Electronically Signed   By: Donnal Fusi M.D.   On: 05/17/2023 07:53   DG Abd Portable 1V Result Date: 05/16/2023 CLINICAL DATA:  Check gastric catheter placement EXAM: PORTABLE ABDOMEN - 1 VIEW COMPARISON:  None Available. FINDINGS: Scattered large and small bowel gas is noted. Gastric catheter is coiled within a hiatal hernia just above the esophageal hiatus. Chest wall port is noted. Lung bases demonstrate minimal atelectasis on the left. IMPRESSION: Gastric catheter coiled within a hiatal hernia. Electronically Signed   By: Violeta Grey M.D.   On: 05/16/2023 19:15   CT ABDOMEN PELVIS W CONTRAST Result Date: 05/16/2023 CLINICAL DATA:  Abdominal pain, vomiting, history of ovarian cancer and peritoneal carcinomatosis EXAM: CT ABDOMEN AND PELVIS WITH CONTRAST TECHNIQUE: Multidetector CT imaging of the abdomen and pelvis was performed using the standard protocol following bolus administration of intravenous contrast. RADIATION DOSE REDUCTION: This exam was performed according to the departmental dose-optimization program which includes automated exposure control, adjustment of the mA and/or kV according to patient size and/or use of iterative reconstruction technique. CONTRAST:  OMNIPAQUE  IOHEXOL  300 MG/ML  SOLN COMPARISON:  03/18/2023 FINDINGS: Lower chest: Large hiatal hernia. Bilateral pleural effusions, left greater than  right. Stable left lower lobe atelectasis. Hepatobiliary: No focal liver abnormality is seen. No gallstones, gallbladder wall thickening, or biliary dilatation. Pancreas: Unremarkable. No pancreatic ductal dilatation or surrounding inflammatory changes.  Spleen: Normal in size without focal abnormality. Adrenals/Urinary Tract: The adrenals are unremarkable. The kidneys enhance normally and symmetrically. No urinary tract calculi or obstructive uropathy. The bladder is decompressed, limiting its evaluation. Stomach/Bowel: Postsurgical changes from distal colon resection and diverting colostomy left upper quadrant. Evidence of prior small bowel resection with ileo colic anastomosis. There is moderate distension of the distal small bowel with scattered gas fluid levels, with jejunum measuring up to 3.1 cm in diameter. There is formed stool within the distal small bowel just proximal to the ileocolic anastomosis. Findings are concerning for early or intermittent small bowel obstruction with transition at the site of ileocolic anastomosis. No bowel wall thickening or acute inflammatory changes. Large hiatal hernia as described above. Vascular/Lymphatic: Aortic atherosclerosis. No pathologic adenopathy within the abdomen or pelvis. Reproductive: Status post hysterectomy. No adnexal masses. Other: Trace free fluid within the right upper quadrant and lower pelvis. No free intraperitoneal gas. Stable postsurgical changes from midline laparotomy. Mild diffuse peritoneal thickening again identified, grossly stable. No distinct mesenteric nodules or masses. Musculoskeletal: No acute or destructive bony abnormalities. Reconstructed images demonstrate no additional findings. IMPRESSION: 1. Findings concerning for early or intermittent small bowel obstruction with transition at the level of prior ileocolic anastomosis. Continued radiographic follow-up recommended. 2. Bilateral pleural effusions, left greater than right, with stable left lower lobe atelectasis. 3. Stable mild diffuse peritoneal thickening without discrete nodularity or masses. 4. Trace abdominal and pelvic ascites. 5.  Aortic Atherosclerosis (ICD10-I70.0). 6. Stable large hiatal hernia. Electronically Signed   By:  Bobbye Burrow M.D.   On: 05/16/2023 14:20    Anti-infectives: Anti-infectives (From admission, onward)    None       Assessment/Plan: SBO Peritoneal carcinomatosis on chemotherapy History of ex lap with radical tumor debulking including total omentectomy, resection of peritoneal disease, SBR with reanastomosis, en bloc resection of the left fallopian tube and ovary as well as rectosigmoid colon, left ureterolysis, right salpingo-oophorectomy, end descending colostomy 11/2022 by Dr. Orvil Bland   CT scan concerning for early or intermittent small bowel obstruction with transition at the level of the prior ileocolic anastomosis   -Bowel function has improved overnight - Clamp NG tube - Start clear liquids - If patient becomes nauseated or more distended, may put NG back to LIWS     FEN: Clamp NG; clear liquids ID: None VTE: Please hold Eliquis .  Okay for heparin  drip if clinically indicated  LOS: 1 day    [name] 05/17/2023

## 2023-05-18 ENCOUNTER — Inpatient Hospital Stay (HOSPITAL_COMMUNITY)

## 2023-05-18 DIAGNOSIS — R112 Nausea with vomiting, unspecified: Secondary | ICD-10-CM | POA: Diagnosis not present

## 2023-05-18 DIAGNOSIS — C482 Malignant neoplasm of peritoneum, unspecified: Secondary | ICD-10-CM | POA: Diagnosis not present

## 2023-05-18 DIAGNOSIS — R300 Dysuria: Secondary | ICD-10-CM | POA: Diagnosis not present

## 2023-05-18 DIAGNOSIS — K56600 Partial intestinal obstruction, unspecified as to cause: Secondary | ICD-10-CM | POA: Diagnosis not present

## 2023-05-18 LAB — CBC WITH DIFFERENTIAL/PLATELET
Abs Immature Granulocytes: 0.01 10*3/uL (ref 0.00–0.07)
Basophils Absolute: 0 10*3/uL (ref 0.0–0.1)
Basophils Relative: 1 %
Eosinophils Absolute: 0.1 10*3/uL (ref 0.0–0.5)
Eosinophils Relative: 2 %
HCT: 38.3 % (ref 36.0–46.0)
Hemoglobin: 12.4 g/dL (ref 12.0–15.0)
Immature Granulocytes: 0 %
Lymphocytes Relative: 19 %
Lymphs Abs: 0.8 10*3/uL (ref 0.7–4.0)
MCH: 31.2 pg (ref 26.0–34.0)
MCHC: 32.4 g/dL (ref 30.0–36.0)
MCV: 96.5 fL (ref 80.0–100.0)
Monocytes Absolute: 0.9 10*3/uL (ref 0.1–1.0)
Monocytes Relative: 22 %
Neutro Abs: 2.3 10*3/uL (ref 1.7–7.7)
Neutrophils Relative %: 56 %
Platelets: 219 10*3/uL (ref 150–400)
RBC: 3.97 MIL/uL (ref 3.87–5.11)
RDW: 17.1 % — ABNORMAL HIGH (ref 11.5–15.5)
WBC: 4.1 10*3/uL (ref 4.0–10.5)
nRBC: 0 % (ref 0.0–0.2)

## 2023-05-18 LAB — BASIC METABOLIC PANEL WITH GFR
Anion gap: 8 (ref 5–15)
BUN: 14 mg/dL (ref 8–23)
CO2: 21 mmol/L — ABNORMAL LOW (ref 22–32)
Calcium: 8 mg/dL — ABNORMAL LOW (ref 8.9–10.3)
Chloride: 107 mmol/L (ref 98–111)
Creatinine, Ser: 0.62 mg/dL (ref 0.44–1.00)
GFR, Estimated: >60 mL/min (ref >60)
Glucose, Bld: 106 mg/dL — ABNORMAL HIGH (ref 70–99)
Potassium: 3.4 mmol/L — ABNORMAL LOW (ref 3.5–5.1)
Sodium: 136 mmol/L (ref 135–145)

## 2023-05-18 MED ORDER — DICLOFENAC SODIUM 1 % EX GEL
2.0000 g | Freq: Four times a day (QID) | CUTANEOUS | Status: DC | PRN
  Administered 2023-05-18: 2 g via TOPICAL
  Filled 2023-05-18: qty 100

## 2023-05-18 MED ORDER — POTASSIUM CHLORIDE 10 MEQ/100ML IV SOLN
10.0000 meq | INTRAVENOUS | Status: AC
  Administered 2023-05-18 (×4): 10 meq via INTRAVENOUS
  Filled 2023-05-18 (×4): qty 100

## 2023-05-18 MED ORDER — PHENAZOPYRIDINE HCL 200 MG PO TABS
200.0000 mg | ORAL_TABLET | Freq: Three times a day (TID) | ORAL | Status: AC
  Filled 2023-05-18 (×7): qty 1

## 2023-05-18 MED ORDER — DIATRIZOATE MEGLUMINE & SODIUM 66-10 % PO SOLN
90.0000 mL | Freq: Once | ORAL | Status: AC
  Administered 2023-05-18: 90 mL via NASOGASTRIC
  Filled 2023-05-18: qty 90

## 2023-05-18 NOTE — Progress Notes (Signed)
 Mobility Specialist - Progress Note   05/18/23 0935  Mobility  Activity Ambulated independently in hallway  Level of Assistance Independent  Assistive Device None  Distance Ambulated (ft) 1000 ft  Activity Response Tolerated well  Mobility Referral Yes  Mobility visit 1 Mobility  Mobility Specialist Start Time (ACUTE ONLY) 0920  Mobility Specialist Stop Time (ACUTE ONLY) 0934  Mobility Specialist Time Calculation (min) (ACUTE ONLY) 14 min   Pt received in bed and agreeable to mobility. No complaints during session. Pt to bed after session with all needs met.    [facility]

## 2023-05-18 NOTE — Progress Notes (Signed)
 Subjective/Chief Complaint: Patient with nausea after being clamped for several hours. Feels better after being placed back to wall suction Still having liquid stool in ostomy   Objective: Vital signs in last 24 hours: Temp:  [97.9 F (36.6 C)-98.8 F (37.1 C)] 97.9 F (36.6 C) (04/19 0513) Pulse Rate:  [72-117] 100 (04/19 0513) Resp:  [18-19] 18 (04/19 0513) BP: (109-140)/(65-77) 109/68 (04/19 0513) SpO2:  [93 %-96 %] 95 % (04/19 0513) Last BM Date : 05/17/23  Intake/Output from previous day: 04/18 0701 - 04/19 0700 In: -  Out: 900 [Emesis/NG output:900] Intake/Output this shift: No intake/output data recorded.  WDWN in NAD NG functioning Abd - minimal distention Ostomy pink with soft brown stool and flatus  Lab Results:  Recent Labs    05/17/23 0311 05/17/23 1020  WBC 2.9* 2.9*  HGB 11.3* 11.1*  HCT 35.8* 34.1*  PLT 188 186   BMET Recent Labs    05/17/23 0311 05/18/23 0458  NA 136 136  K 3.9 3.4*  CL 106 107  CO2 23 21*  GLUCOSE 117* 106*  BUN 14 14  CREATININE 0.59 0.62  CALCIUM  8.4* 8.0*     Studies/Results: DG Abd Portable 1V-Small Bowel Obstruction Protocol-initial, 8 hr delay Result Date: 05/17/2023 CLINICAL DATA:  Small-bowel follow-through. EXAM: PORTABLE ABDOMEN - 1 VIEW COMPARISON:  05/16/2023 FINDINGS: NG tube is folded back on itself in the patient's known hiatal hernia. Distal tip of the NG tube is not been included on the film. Gaseous small bowel distention in the abdomen measures up to 4 cm diameter. There is evidence of gas and stool in the left colon without substantial colonic dilatation. No discernible enteric contrast within the small bowel or colon. IMPRESSION: 1. Gaseous small bowel distention in the abdomen measures up to 4 cm diameter. Imaging features are compatible with small bowel obstruction. 2. No discernible enteric contrast within the small bowel or colon. Patient is noted to have a left abdominal colostomy on recent CT  and there may be some contrast material in the colostomy bag overlying the midline low pelvis. Electronically Signed   By: Donnal Fusi M.D.   On: 05/17/2023 07:53   DG Abd Portable 1V Result Date: 05/16/2023 CLINICAL DATA:  Check gastric catheter placement EXAM: PORTABLE ABDOMEN - 1 VIEW COMPARISON:  None Available. FINDINGS: Scattered large and small bowel gas is noted. Gastric catheter is coiled within a hiatal hernia just above the esophageal hiatus. Chest wall port is noted. Lung bases demonstrate minimal atelectasis on the left. IMPRESSION: Gastric catheter coiled within a hiatal hernia. Electronically Signed   By: Violeta Grey M.D.   On: 05/16/2023 19:15   CT ABDOMEN PELVIS W CONTRAST Result Date: 05/16/2023 CLINICAL DATA:  Abdominal pain, vomiting, history of ovarian cancer and peritoneal carcinomatosis EXAM: CT ABDOMEN AND PELVIS WITH CONTRAST TECHNIQUE: Multidetector CT imaging of the abdomen and pelvis was performed using the standard protocol following bolus administration of intravenous contrast. RADIATION DOSE REDUCTION: This exam was performed according to the departmental dose-optimization program which includes automated exposure control, adjustment of the mA and/or kV according to patient size and/or use of iterative reconstruction technique. CONTRAST:  OMNIPAQUE  IOHEXOL  300 MG/ML  SOLN COMPARISON:  03/18/2023 FINDINGS: Lower chest: Large hiatal hernia. Bilateral pleural effusions, left greater than right. Stable left lower lobe atelectasis. Hepatobiliary: No focal liver abnormality is seen. No gallstones, gallbladder wall thickening, or biliary dilatation. Pancreas: Unremarkable. No pancreatic ductal dilatation or surrounding inflammatory changes. Spleen: Normal in size without  focal abnormality. Adrenals/Urinary Tract: The adrenals are unremarkable. The kidneys enhance normally and symmetrically. No urinary tract calculi or obstructive uropathy. The bladder is decompressed, limiting  its evaluation. Stomach/Bowel: Postsurgical changes from distal colon resection and diverting colostomy left upper quadrant. Evidence of prior small bowel resection with ileo colic anastomosis. There is moderate distension of the distal small bowel with scattered gas fluid levels, with jejunum measuring up to 3.1 cm in diameter. There is formed stool within the distal small bowel just proximal to the ileocolic anastomosis. Findings are concerning for early or intermittent small bowel obstruction with transition at the site of ileocolic anastomosis. No bowel wall thickening or acute inflammatory changes. Large hiatal hernia as described above. Vascular/Lymphatic: Aortic atherosclerosis. No pathologic adenopathy within the abdomen or pelvis. Reproductive: Status post hysterectomy. No adnexal masses. Other: Trace free fluid within the right upper quadrant and lower pelvis. No free intraperitoneal gas. Stable postsurgical changes from midline laparotomy. Mild diffuse peritoneal thickening again identified, grossly stable. No distinct mesenteric nodules or masses. Musculoskeletal: No acute or destructive bony abnormalities. Reconstructed images demonstrate no additional findings. IMPRESSION: 1. Findings concerning for early or intermittent small bowel obstruction with transition at the level of prior ileocolic anastomosis. Continued radiographic follow-up recommended. 2. Bilateral pleural effusions, left greater than right, with stable left lower lobe atelectasis. 3. Stable mild diffuse peritoneal thickening without discrete nodularity or masses. 4. Trace abdominal and pelvic ascites. 5.  Aortic Atherosclerosis (ICD10-I70.0). 6. Stable large hiatal hernia. Electronically Signed   By: Bobbye Burrow M.D.   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 Orvil Bland   CT scan concerning for early or intermittent small bowel obstruction with transition at the level of the prior ileocolic anastomosis   -Possible partial SBO, as patient continues to have ostomy output - Repeat SBO protocol with gastrografin  today - may act to stimulate more bowel function  Surgical intervention would be difficult and higher risk due to recent chemotherapy and peritoneal carcinomatosis.     FEN:  NG to LIWS; ice chips ID: None VTE: Please hold Eliquis .  Okay for heparin  drip if clinically indicated  LOS: 2 days    [name] 05/18/2023

## 2023-05-18 NOTE — Progress Notes (Signed)
 Gynecologic Oncology Progress Note  Subjective: Patient reports developing nausea after NG had been clamped for several hours yesterday.  Nausea improved with NG back to suction.  Developed dysuria yesterday evening, arrives this is feeling burning as urine coming through her urethra.  Only emptied her ostomy bag once yesterday, more liquid stool than her norm.  Some abdominal cramping yesterday.  Objective: Vital signs in last 24 hours: Temp:  [97.9 F (36.6 C)-98.8 F (37.1 C)] 97.9 F (36.6 C) (04/19 0513) Pulse Rate:  [72-117] 100 (04/19 0513) Resp:  [18-19] 18 (04/19 0513) BP: (109-140)/(65-77) 109/68 (04/19 0513) SpO2:  [93 %-96 %] 95 % (04/19 0513) Last BM Date : 05/17/23  Intake/Output from previous day: 04/18 0701 - 04/19 0700 In: -  Out: 900 [Emesis/NG output:900]  Physical Examination: General: alert, cooperative, and no distress Resp: clear to auscultation bilaterally Cardio: regular rate and rhythm, S1, S2 normal, no murmur, click, rub or gallop GI: hypoactive bowel sounds, NG tube in place to intermittent suction, abdomen mildly distended and tympanitic, soft, ostomy with small amount of loose brown stool and some flatus, stoma remains prolapsed/non-edematous/stoma pink and viable Extremities: extremities normal, atraumatic, no cyanosis or edema  Labs:    Latest Ref Rng & Units 05/17/2023   10:20 AM 05/17/2023    3:11 AM 05/16/2023   10:31 AM  CBC  WBC 4.0 - 10.5 K/uL 2.9  2.9  8.6   Hemoglobin 12.0 - 15.0 g/dL 16.1  09.6  04.5   Hematocrit 36.0 - 46.0 % 34.1  35.8  40.4   Platelets 150 - 400 K/uL 186  188  236       Latest Ref Rng & Units 05/18/2023    4:58 AM 05/17/2023    3:11 AM 05/16/2023   10:31 AM  BMP  Glucose 70 - 99 mg/dL 409  811  914   BUN 8 - 23 mg/dL 14  14  10    Creatinine 0.44 - 1.00 mg/dL 7.82  9.56  2.13   Sodium 135 - 145 mmol/L 136  136  136   Potassium 3.5 - 5.1 mmol/L 3.4  3.9  3.1   Chloride 98 - 111 mmol/L 107  106  107   CO2 22 -  32 mmol/L 21  23  19    Calcium  8.9 - 10.3 mg/dL 8.0  8.4  8.4    Assessment:  78 y.o. admitted with partial small bowel obstruction with transition at the level of prior ileocolic anastomosis. 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 Now s/p 4 cycles of adj C/T (last on 03/25/23). She is currently on Zejula .  Partial SBO: Continued ostomy output although decreased.  Hypoactive bowel sounds.  Less NG output over the last 24 hours.  Would recommend bowel rest today.  If output further decreases and ostomy output improves, can consider clamp trial again tomorrow versus small bowel follow-through. Would recommend some GI prophylaxis while NGT in place.  Dysuria: Discussed with patient UA from yesterday without indication of urine infection.  Given symptoms in the setting of leukopenia, urine culture added.  Discussed trial of Pyridium  to see if this helps.  Peritoneal carcinoma: Hold PARPi until tolerating PO.  Stoma prolapse: Stoma healthy, much less edematous than during recent prior admission.  Patient is scheduled to see Dr. Camilo Cella at the end of the month.  Leukopenia: recommend trending daily CBC.  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 Repeat doppler in 10/2022 negative for DVT. Ok for patient to be on prophylactic lovenox  during hospital stay.    LOS: 2 days    Suzi Essex 05/18/2023, 7:19 AM

## 2023-05-18 NOTE — Plan of Care (Signed)

## 2023-05-18 NOTE — Progress Notes (Signed)
 PROGRESS NOTE    Rachel Carlson  ZOX:[id number]  DOB: Dec 29, 1945  DOA: 05/16/2023 PCP: Clemens Curt, MD Outpatient Specialists:   Hospital course:  78 year old female with ovarian cancer and carcinomatosis s/p ex lap with tumor debulking, total omentectomy, SBR with 3 anastomosis, resection of ovary and rectosigmoid., DVT, GERD admitted last night for possible partial small bowel obstruction with probable transition point at the level of colonic anastomosis.  Patient was started on conservative management with bowel rest via NG tube   Subjective:  Patient notes that she had severe nausea and had to be put back on wall suction, feels much better.  Still has pain with swallowing although it might be slight better than yesterday.  Chloraseptic was not effective at all, notes the pain is not in the back of her throat but around her upper throat esophagus when she swallows, possibly with the movement of thyroid  cartilage.   Objective: Vitals:   05/17/23 2009 05/17/23 2347 05/18/23 0513 05/18/23 1315  BP: 123/76 115/65 109/68 130/68  Pulse: (!) 117 92 100 92  Resp: 18 18 18 16   Temp: 98.1 F (36.7 C) 98 F (36.7 C) 97.9 F (36.6 C) 97.7 F (36.5 C)  TempSrc: Oral Oral Oral Oral  SpO2: 96% 93% 95% 97%  Weight:      Height:        Intake/Output Summary (Last 24 hours) at 05/18/2023 1628 Last data filed at 05/18/2023 1500 Gross per 24 hour  Intake 400.32 ml  Output 900 ml  Net -499.68 ml   Filed Weights   05/16/23 1004  Weight: 72.6 kg     Exam:  General: Patient sitting on side of bed with NG tube in place appearing tired Eyes: sclera anicteric, conjuctiva mild injection bilaterally CVS: S1-S2, regular  Respiratory:  decreased air entry bilaterally secondary to decreased inspiratory effort, rales at bases  GI: Firm but not hard, slightly distended, no tenderness to light palpation LE: Warm and well-perfused Neuro: A/O x 3,  grossly nonfocal.  Psych: patient  is logical and coherent, judgement and insight appear normal, mood and affect appropriate to situation.  Data Reviewed:  Basic Metabolic Panel: Recent Labs  Lab 05/13/23 0919 05/16/23 1031 05/17/23 0311 05/18/23 0458  NA 136 136 136 136  K 3.9 3.1* 3.9 3.4*  CL 102 107 106 107  CO2 23 19* 23 21*  GLUCOSE 99 136* 117* 106*  BUN 13 10 14 14   CREATININE 0.59 0.51 0.59 0.62  CALCIUM  9.4 8.4* 8.4* 8.0*  MG 1.9 1.9  --   --   PHOS  --  3.1  --   --     CBC: Recent Labs  Lab 05/13/23 0919 05/16/23 1031 05/17/23 0311 05/17/23 1020 05/18/23 0935  WBC 3.5* 8.6 2.9* 2.9* 4.1  NEUTROABS 2.1 7.3  --  1.5* 2.3  HGB 11.0* 13.4 11.3* 11.1* 12.4  HCT 33.9* 40.4 35.8* 34.1* 38.3  MCV 95.2 94.4 98.1 97.4 96.5  PLT 189 236 188 186 219     Scheduled Meds:  Chlorhexidine  Gluconate Cloth  6 each Topical Daily   enoxaparin  (LOVENOX ) injection  40 mg Subcutaneous Q24H   metoprolol  tartrate  5 mg Intravenous Q12H   phenazopyridine   200 mg Oral TID WC   Continuous Infusions:  sodium chloride  100 mL/hr at 05/17/23 0919     Assessment & Plan:   SBO 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 At present controlled on metoprolol  5 IVP twice daily    DVT prophylaxis: Lovenox  Code Status: Full Family Communication: None today     Studies: DG Abd Portable 1V-Small Bowel Obstruction  Protocol-initial, 8 hr delay Result Date: 05/17/2023 CLINICAL DATA:  Small-bowel follow-through. EXAM: PORTABLE ABDOMEN - 1 VIEW COMPARISON:  05/16/2023 FINDINGS: NG tube is folded back on itself in the patient's known hiatal hernia. Distal tip of the NG tube is not been included on the film. Gaseous small bowel distention in the abdomen measures up to 4 cm diameter. There is evidence of gas and stool in the left colon without substantial colonic dilatation. No discernible enteric contrast within the small bowel or colon. IMPRESSION: 1. Gaseous small bowel distention in the abdomen measures up to 4 cm diameter. Imaging features are compatible with small bowel obstruction. 2. No discernible enteric contrast within the small bowel or colon. 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 Electronically Signed   By: Donnal Fusi M.D.   On: 05/17/2023 07:53   DG Abd Portable 1V Result Date: 05/16/2023 CLINICAL DATA:  Check gastric catheter placement EXAM: PORTABLE ABDOMEN - 1 VIEW COMPARISON:  None Available. FINDINGS: Scattered large and small bowel gas is noted. Gastric catheter is coiled within a hiatal hernia just above the esophageal hiatus. Chest wall port is noted. Lung bases demonstrate minimal atelectasis on the left. IMPRESSION: Gastric catheter coiled within a hiatal hernia. Electronically Signed   By: Violeta Grey M.D.   On: 05/16/2023 19:15    Principal Problem:   Partial small bowel obstruction (HCC) Active Problems:   Hyperlipidemia   GERD (gastroesophageal reflux disease)   Prediabetes   Essential hypertension   Iron deficiency anemia   Hypokalemia     Waller Marcussen Rollene Clink, [facility]  If 7PM-7AM, please contact night-coverage [email or web address]   LOS: 2 days

## 2023-05-19 ENCOUNTER — Encounter (HOSPITAL_COMMUNITY): Payer: Self-pay | Admitting: Internal Medicine

## 2023-05-19 DIAGNOSIS — R112 Nausea with vomiting, unspecified: Secondary | ICD-10-CM

## 2023-05-19 DIAGNOSIS — C482 Malignant neoplasm of peritoneum, unspecified: Secondary | ICD-10-CM

## 2023-05-19 DIAGNOSIS — R300 Dysuria: Secondary | ICD-10-CM | POA: Diagnosis not present

## 2023-05-19 DIAGNOSIS — K56600 Partial intestinal obstruction, unspecified as to cause: Secondary | ICD-10-CM | POA: Diagnosis not present

## 2023-05-19 HISTORY — DX: Nausea with vomiting, unspecified: R11.2

## 2023-05-19 LAB — URINE CULTURE: Culture: NO GROWTH

## 2023-05-19 LAB — BASIC METABOLIC PANEL WITH GFR
Anion gap: 11 (ref 5–15)
BUN: 12 mg/dL (ref 8–23)
CO2: 20 mmol/L — ABNORMAL LOW (ref 22–32)
Calcium: 8.1 mg/dL — ABNORMAL LOW (ref 8.9–10.3)
Chloride: 105 mmol/L (ref 98–111)
Creatinine, Ser: 0.53 mg/dL (ref 0.44–1.00)
GFR, Estimated: >60 mL/min (ref >60)
Glucose, Bld: 77 mg/dL (ref 70–99)
Potassium: 3.3 mmol/L — ABNORMAL LOW (ref 3.5–5.1)
Sodium: 136 mmol/L (ref 135–145)

## 2023-05-19 LAB — PHOSPHORUS: Phosphorus: 3 mg/dL (ref 2.5–4.6)

## 2023-05-19 LAB — GLUCOSE, CAPILLARY: Glucose-Capillary: 83 mg/dL (ref 70–99)

## 2023-05-19 LAB — MAGNESIUM: Magnesium: 1.8 mg/dL (ref 1.7–2.4)

## 2023-05-19 MED ORDER — MAGNESIUM SULFATE 2 GM/50ML IV SOLN
2.0000 g | Freq: Once | INTRAVENOUS | Status: AC
  Administered 2023-05-19: 2 g via INTRAVENOUS
  Filled 2023-05-19: qty 50

## 2023-05-19 MED ORDER — SODIUM CHLORIDE 0.9 % IV SOLN: INTRAVENOUS | Status: AC

## 2023-05-19 MED ORDER — POTASSIUM CHLORIDE 10 MEQ/100ML IV SOLN: 10.0000 meq | INTRAVENOUS | Status: DC

## 2023-05-19 MED ORDER — INSULIN ASPART 100 UNIT/ML IJ SOLN
0.0000 [IU] | Freq: Four times a day (QID) | INTRAMUSCULAR | Status: DC
  Administered 2023-05-20: 1 [IU] via SUBCUTANEOUS
  Administered 2023-05-20: 2 [IU] via SUBCUTANEOUS
  Administered 2023-05-20: 1 [IU] via SUBCUTANEOUS
  Administered 2023-05-20 – 2023-05-21 (×2): 2 [IU] via SUBCUTANEOUS
  Administered 2023-05-21 – 2023-05-22 (×4): 1 [IU] via SUBCUTANEOUS

## 2023-05-19 MED ORDER — TRAVASOL 10 % IV SOLN
INTRAVENOUS | Status: AC
  Filled 2023-05-19: qty 441.6

## 2023-05-19 MED ORDER — THIAMINE HCL 100 MG/ML IJ SOLN
100.0000 mg | Freq: Every day | INTRAMUSCULAR | Status: AC
  Administered 2023-05-19 – 2023-05-23 (×5): 100 mg via INTRAVENOUS
  Filled 2023-05-19 (×5): qty 2

## 2023-05-19 MED ORDER — POTASSIUM CHLORIDE 10 MEQ/100ML IV SOLN
10.0000 meq | INTRAVENOUS | Status: AC
  Administered 2023-05-19 (×4): 10 meq via INTRAVENOUS
  Filled 2023-05-19 (×4): qty 100

## 2023-05-19 NOTE — Progress Notes (Signed)
 Gynecologic Oncology Progress Note   Subjective: Patient reports had some nausea overnight with her head of the bed flat, feeling better now.  Again amount of gas overnight requiring burping of her bag multiple times.  Objective: Vital signs in last 24 hours: Temp:  [97.7 F (36.5 C)-98.7 F (37.1 C)] 98.2 F (36.8 C) (04/20 0442) Pulse Rate:  [92-100] 93 (04/20 0442) Resp:  [16-20] 20 (04/20 0442) BP: (128-140)/(63-68) 128/63 (04/20 0442) SpO2:  [96 %-98 %] 98 % (04/20 0442) Last BM Date : 05/18/23  Intake/Output from previous day: 04/19 0701 - 04/20 0700 In: 400.3 [IV Piggyback:400.3] Out: 1150 [Emesis/NG output:1150]  Physical Examination: General: alert, cooperative, and no distress Resp: clear to auscultation bilaterally Cardio: regular rate and rhythm, S1, S2 normal, no murmur, click, rub or gallop GI: some tinkling otherwise improving bowel sounds, NG tube in place to intermittent suction, abdomen mildly distended and tympanitic, soft, ostomy with large amount of gas (burped), stoma remains prolapsed/non-edematous/stoma pink and viable Extremities: extremities normal, atraumatic, no cyanosis or edema  Labs:    Latest Ref Rng & Units 05/18/2023    9:35 AM 05/17/2023   10:20 AM 05/17/2023    3:11 AM  CBC  WBC 4.0 - 10.5 K/uL 4.1  2.9  2.9   Hemoglobin 12.0 - 15.0 g/dL 16.1  09.6  04.5   Hematocrit 36.0 - 46.0 % 38.3  34.1  35.8   Platelets 150 - 400 K/uL 219  186  188       Latest Ref Rng & Units 05/19/2023    4:14 AM 05/18/2023    4:58 AM 05/17/2023    3:11 AM  BMP  Glucose 70 - 99 mg/dL 77  409  811   BUN 8 - 23 mg/dL 12  14  14    Creatinine 0.44 - 1.00 mg/dL 9.14  7.82  9.56   Sodium 135 - 145 mmol/L 136  136  136   Potassium 3.5 - 5.1 mmol/L 3.3  3.4  3.9   Chloride 98 - 111 mmol/L 105  107  106   CO2 22 - 32 mmol/L 20  21  23    Calcium  8.9 - 10.3 mg/dL 8.1  8.0  8.4    03/13/06: KUB FINDINGS: A kinked enteric tube is seen with its distal tip overlying  the expected region of the gastroesophageal junction. This is unchanged in appearance when compared to the prior study. Persistent dilated, air distended loops of small bowel are seen within the mid abdomen. Radiopaque contrast is also seen within the ascending and descending colon. No radio-opaque calculi are seen.   IMPRESSION: Findings consistent with a partial small bowel obstruction.  Assessment:  78 y.o. admitted with partial small bowel obstruction with transition at the level of prior ileocolic anastomosis. 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 Now s/p 4 cycles of adj C/T (last on 03/25/23). She is currently on Zejula .   Partial SBO: Continued ostomy output although decreased bowel sounds improving, Kniffin significantly more gas in the bag this morning.  NGT output over 1L yesterday.  Would recommend bowel rest today.   If no decrease in NG output or improvement in ostomy output over the next 1-2 days, it would be reasonable to start dexamethasone .  In the setting of known malignancy, this may be be a malignant partial small bowel obstruction (caused by small volume/peritoneal disease).  Would recommend some GI prophylaxis while NGT in place.   Dysuria: Discussed with patient UA from yesterday without indication of urine infection.  Given symptoms in the setting of leukopenia, urine culture added.  Pyridium  ordered yesterday, ok to clamp tube for patient to take.    Peritoneal carcinoma: Hold PARPi until tolerating PO.   Stoma prolapse: Stoma healthy, much less edematous than during recent prior admission.  Patient is scheduled to see Dr. Camilo Cella at the end of the month.   Leukopenia: resolved.   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 Repeat doppler in 10/2022 negative for DVT. Ok  for patient to be on prophylactic lovenox  during hospital stay, can restart Eliquis  on discharge.   LOS: 3 days    Rachel Carlson 05/19/2023, 7:25 AM

## 2023-05-19 NOTE — Plan of Care (Signed)

## 2023-05-19 NOTE — Progress Notes (Signed)
 Initial Nutrition Assessment  DOCUMENTATION CODES:   Not applicable  INTERVENTION:  Continue TPN, ordered by pharmacy  Monitor labs and replete electrolytes as indicated.   NUTRITION DIAGNOSIS:   Inadequate oral intake related to other (see comment) (prolonged ileus) as evidenced by NPO status.  GOAL:   Patient will meet greater than or equal to 90% of their needs  MONITOR:   Other (Comment), Weight trends (TPN)  REASON FOR ASSESSMENT:   Consult New TPN/TNA  ASSESSMENT:   PMH peritoneal carcinomatosis, IDS (11/2022 - exploratory laparotomy with radical tumor debulking including total omentectomy, resection of peritoneal disease, small bowel resection with 3 anastomoses, en bloc resection of the left fallopian tube and ovary as well as rectosigmoid colon, left ureterolysis, right salpingo-oophorectomy, end descending colostomy) who presented to the ED on 05/16/23 with c/o n/v. Abdominal CT on 05/16/23 showed findings with concern for SBO and abd xray on 4/19 showed partial SBO.  4/20- start TPN for prolonged ileus  Medications reviewed and include: Novolog , thiamine , IV mag, IV potassium chloride , IVF 60 mL/hr  Labs reviewed: Potassium 3.3 L, Albumin  3.0 L   Intake/Output Summary (Last 24 hours) at 05/19/2023 0940 Last data filed at 05/19/2023 0908 Gross per 24 hour  Intake 400.32 ml  Output 1150 ml  Net -749.68 ml    Weights reviewed. Admit weight 72.6 kg.   NUTRITION - FOCUSED PHYSICAL EXAM:  Defer to f/u  Diet Order:   Diet Order             Diet NPO time specified Except for: Ice Chips, Sips with Meds  Diet effective now                   EDUCATION NEEDS:   Not appropriate for education at this time  Skin:  Skin Assessment: Reviewed RN Assessment  Last BM:  4/19  Height:   Ht Readings from Last 1 Encounters:  05/16/23 5\' 2"  (1.575 m)    Weight:   Wt Readings from Last 1 Encounters:  05/16/23 72.6 kg    BMI:  Body mass index is 29.26  kg/m.  Estimated Nutritional Needs:   Kcal:  1750-2000  Protein:  90-115  Fluid:  >1.75 L/day or per MD  Laren Player, MPH, RD, LDN Clinical Dietitian Contact information can be found at [facility]

## 2023-05-19 NOTE — Progress Notes (Addendum)
 PHARMACY - TOTAL PARENTERAL NUTRITION CONSULT NOTE   Indication: Prolonged ileus  Patient Measurements: Height: 5\' 2"  (157.5 cm) Weight: 72.6 kg (160 lb) IBW/kg (Calculated) : 50.1 TPN AdjBW (KG): 55.7 Body mass index is 29.26 kg/m. 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 Abdominal CT on 05/16/23 showed findings with concern for SBO and abd xray on 4/19 showed partial SBO.  Pharmacy has been consulted on 05/19/23 to start TPN for prolonged ileus.  Glucose / Insulin : no hx DM; not on insulin   - cbgs (goal <150): wnl Electrolytes: K 3.3 (KCL x4 runs ordered by MD), Mag 1.8, CorrCa 8.90 (on the lower end of goal range), CO2 low 20, Na 136 (lower end) - phos wnl - Goal for ileus: Mag >2, K >4 Renal: scr 0.53, BUN wnl Hepatic: AST slightly low with labs on 4/18 Intake / Output; MIVF: NS @ 100 ml/hr - I/O: - 750 mL - NG output: 1150 mL  - UOP: 1x GI Imaging: - 4/17 abd CT: Findings concerning for early or intermittent small bowel obstruction with transition at the level of prior ileocolic anastomosis. Bilateral pleural effusions  - 4/19 abb xray: Findings consistent with a partial small bowel obstruction   GI Surgeries / Procedures:   Central access: Port-a- cath  TPN start date: 05/19/23  Nutritional Goals: Goal TPN rate is 90 mL/hr (provides 100 g of protein and 2061 kcals per day) for now and will adjust based on dietician's recom.  RD Assessment: pending     Current Nutrition:  - start TPN on 4/20   Plan:   Now:  - Magnesium  sulfate 2gm IV x1  At 1800:   - Start TPN at 40 mL/hr  - Electrolytes in TPN:  Na 50mEq/L K 50mEq/L Ca 5mEq/L Mg 79mEq/L Phos 15mmol/L Cl:Ac 1:2 - Add standard MVI and trace elements to TPN - Initiate Sensitive q6h SSI and adjust as needed  - Reduce MIVF to 60 mL/hr at 1800 - At minimum, monitor TPN labs on Mon/Thurs. - Thiamine  100 mg IV daily x5 days (4/20 - 4/24)  Patricio Popwell P 05/19/2023,8:38 AM

## 2023-05-19 NOTE — Progress Notes (Signed)
 PROGRESS NOTE    Rachel Carlson  WUJ:[id number]  DOB: 09-27-1945  DOA: 05/16/2023 PCP: Clemens Curt, MD Outpatient Specialists:   Hospital course:  78 year old female with ovarian cancer and carcinomatosis s/p ex lap with tumor debulking, total omentectomy, SBR with 3 anastomosis, resection of ovary and rectosigmoid., DVT, GERD admitted last night for possible partial small bowel obstruction with probable transition point at the level of colonic anastomosis.  Patient was started on conservative management with bowel rest via NG tube   Subjective:  Patient is tolerating NG tube okay.  However she still has has pain with swallowing. Chloraseptic and Voltaren  gel not effective but it might be improving slightly.   Objective: Vitals:   05/18/23 1953 05/19/23 0442 05/19/23 0948 05/19/23 1420  BP: (!) 140/66 128/63 (!) 147/72 (!) 151/73  Pulse: 100 93 98 92  Resp: 18 20 16    Temp: 98.7 F (37.1 C) 98.2 F (36.8 C) 98 F (36.7 C) 98.3 F (36.8 C)  TempSrc: Oral  Oral   SpO2: 96% 98% 96% 97%  Weight:      Height:        Intake/Output Summary (Last 24 hours) at 05/19/2023 1750 Last data filed at 05/19/2023 1202 Gross per 24 hour  Intake 0 ml  Output 1550 ml  Net -1550 ml   Filed Weights   05/16/23 1004  Weight: 72.6 kg     Exam:  General: Patient sitting up in bed with NG tube draining dark bilious liquid. Eyes: sclera anicteric, conjuctiva mild injection bilaterally CVS: S1-S2, regular  Respiratory:  decreased air entry bilaterally secondary to decreased inspiratory effort, rales at bases  GI: Firm but not hard, slightly distended, no tenderness to light palpation LE: Warm and well-perfused Neuro: A/O x 3,  grossly nonfocal.  Psych: patient is logical and coherent, judgement and insight appear normal, mood and affect appropriate to situation.  Data Reviewed:  Basic Metabolic Panel: Recent Labs  Lab 05/13/23 0919 05/16/23 1031 05/17/23 0311  05/18/23 0458 05/19/23 0414  NA 136 136 136 136 136  K 3.9 3.1* 3.9 3.4* 3.3*  CL 102 107 106 107 105  CO2 23 19* 23 21* 20*  GLUCOSE 99 136* 117* 106* 77  BUN 13 10 14 14 12   CREATININE 0.59 0.51 0.59 0.62 0.53  CALCIUM  9.4 8.4* 8.4* 8.0* 8.1*  MG 1.9 1.9  --   --  1.8  PHOS  --  3.1  --   --  3.0    CBC: Recent Labs  Lab 05/13/23 0919 05/16/23 1031 05/17/23 0311 05/17/23 1020 05/18/23 0935  WBC 3.5* 8.6 2.9* 2.9* 4.1  NEUTROABS 2.1 7.3  --  1.5* 2.3  HGB 11.0* 13.4 11.3* 11.1* 12.4  HCT 33.9* 40.4 35.8* 34.1* 38.3  MCV 95.2 94.4 98.1 97.4 96.5  PLT 189 236 188 186 219     Scheduled Meds:  Chlorhexidine  Gluconate Cloth  6 each Topical Daily   enoxaparin  (LOVENOX ) injection  40 mg Subcutaneous Q24H   insulin  aspart  0-9 Units Subcutaneous Q6H   metoprolol  tartrate  5 mg Intravenous Q12H   phenazopyridine   200 mg Oral TID WC   thiamine  (VITAMIN B1) injection  100 mg Intravenous Daily   Continuous Infusions:  sodium chloride  100 mL/hr at 05/19/23 0256   sodium chloride      TPN ADULT (ION) 40 mL/hr at 05/19/23 1748     Assessment & Plan:   SBO 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 Patient is back  on wall suction with ongoing output from NG tube Has nausea yesterday, is written for both Zofran  and Compazine  as needed Surgery and GYN oncology continue to follow patient Clamped tube, start clear liquids per general surgery recommendations On NS 100 cc an hour for maintenance  Hypokalemia Will replete and recheck  Pain with swallowing Patient has pain to the right of the thyroid  cartilage, started when NG tube was placed Patient is a International aid/development worker, she thinks she might of pulled a muscle around her thyroid  cartilage Voltaren  gel minimally effective, Chloraseptic not effective  GERD Restart oral PPI when patient able to take p.o. Patient unable to tolerate IV pantoprazole  or omeprazole  due to diarrhea  HTN Can restart home BP meds when able  to take p.o. At present controlled on metoprolol  5 IVP twice daily    DVT prophylaxis: Lovenox  Code Status: Full Family Communication: None today     Studies: DG Abd Portable 1V-Small Bowel Obstruction Protocol-initial, 8 hr delay Result Date: 05/18/2023 CLINICAL DATA:  Small-bowel follow-through. EXAM: PORTABLE ABDOMEN - 1 VIEW COMPARISON:  May 17, 2023 (5:19 a.m.) FINDINGS: A kinked enteric tube is seen with its distal tip overlying the expected region of the gastroesophageal junction. This is unchanged in appearance when compared to the prior study. Persistent dilated, air distended loops of small bowel are seen within the mid abdomen. Radiopaque contrast is also seen within the ascending and descending colon. No radio-opaque calculi are seen. IMPRESSION: Findings consistent with a partial small bowel obstruction. Electronically Signed   By: Virgle Grime M.D.   On: 05/18/2023 22:10    Principal Problem:   Partial small bowel obstruction (HCC) Active Problems:   Hyperlipidemia   GERD (gastroesophageal reflux disease)   Prediabetes   Essential hypertension   Iron deficiency anemia   Hypokalemia   Nausea and vomiting   Primary peritoneal adenocarcinoma (HCC)   Dysuria     Yojan Paskett Rollene Clink, [facility]  If 7PM-7AM, please contact night-coverage [email or web address]   LOS: 3 days

## 2023-05-19 NOTE — Progress Notes (Signed)
   Subjective/Chief Complaint: Repeat SBO protocol shows contrast in colon, although SB remains dilated Significant flatus output, some stool in ostomy It has been several days since any PO nutrition   Objective: Vital signs in last 24 hours: Temp:  [97.7 F (36.5 C)-98.7 F (37.1 C)] 98.2 F (36.8 C) (04/20 0442) Pulse Rate:  [92-100] 93 (04/20 0442) Resp:  [16-20] 20 (04/20 0442) BP: (128-140)/(63-68) 128/63 (04/20 0442) SpO2:  [96 %-98 %] 98 % (04/20 0442) Last BM Date : 05/18/23  Intake/Output from previous day: 04/19 0701 - 04/20 0700 In: 400.3 [IV Piggyback:400.3] Out: 1150 [Emesis/NG output:1150] Intake/Output this shift: No intake/output data recorded.  WDWN in NAD NG functioning Abd - minimal distention Ostomy pink with mild prolapse with soft brown stool and large amount of flatus  Lab Results:  Recent Labs    05/17/23 1020 05/18/23 0935  WBC 2.9* 4.1  HGB 11.1* 12.4  HCT 34.1* 38.3  PLT 186 219   BMET Recent Labs    05/18/23 0458 05/19/23 0414  NA 136 136  K 3.4* 3.3*  CL 107 105  CO2 21* 20*  GLUCOSE 106* 77  BUN 14 12  CREATININE 0.62 0.53  CALCIUM  8.0* 8.1*     Studies/Results: DG Abd Portable 1V-Small Bowel Obstruction Protocol-initial, 8 hr delay Result Date: 05/18/2023 CLINICAL DATA:  Small-bowel follow-through. EXAM: PORTABLE ABDOMEN - 1 VIEW COMPARISON:  May 17, 2023 (5:19 a.m.) FINDINGS: A kinked enteric tube is seen with its distal tip overlying the expected region of the gastroesophageal junction. This is unchanged in appearance when compared to the prior study. Persistent dilated, air distended loops of small bowel are seen within the mid abdomen. Radiopaque contrast is also seen within the ascending and descending colon. No radio-opaque calculi are seen. IMPRESSION: Findings consistent with a partial small bowel obstruction. Electronically Signed   By: Virgle Grime M.D.   On: 05/18/2023 22:10      Assessment/Plan: SBO Peritoneal carcinomatosis on chemotherapy History of ex lap with radical tumor debulking including total omentectomy, resection of peritoneal disease, SBR with reanastomosis, en bloc resection of the left fallopian tube and ovary as well as rectosigmoid colon, left ureterolysis, right salpingo-oophorectomy, end descending colostomy 11/2022 by Dr. Orvil Bland   CT scan concerning for early or intermittent small bowel obstruction with transition at the level of the prior ileocolic anastomosis   -Partial SBO, as patient continues to have ostomy output -Continue NG function - Replete K - Start TPN for nutritional support   Surgical intervention would be difficult and higher risk due to recent chemotherapy and peritoneal carcinomatosis.     FEN:  NG to LIWS; ice chips/ TPN ID: None VTE: Please hold Eliquis .  Okay for heparin  drip if clinically indicated  LOS: 3 days    Rachel Carlson 05/19/2023

## 2023-05-20 ENCOUNTER — Ambulatory Visit (HOSPITAL_COMMUNITY): Admitting: Nurse Practitioner

## 2023-05-20 DIAGNOSIS — C482 Malignant neoplasm of peritoneum, unspecified: Secondary | ICD-10-CM | POA: Diagnosis not present

## 2023-05-20 DIAGNOSIS — K56600 Partial intestinal obstruction, unspecified as to cause: Secondary | ICD-10-CM | POA: Diagnosis not present

## 2023-05-20 DIAGNOSIS — Y833 Surgical operation with formation of external stoma as the cause of abnormal reaction of the patient, or of later complication, without mention of misadventure at the time of the procedure: Secondary | ICD-10-CM | POA: Diagnosis not present

## 2023-05-20 LAB — COMPREHENSIVE METABOLIC PANEL WITH GFR
ALT: 9 U/L (ref 0–44)
AST: 12 U/L — ABNORMAL LOW (ref 15–41)
Albumin: 2.7 g/dL — ABNORMAL LOW (ref 3.5–5.0)
Alkaline Phosphatase: 51 U/L (ref 38–126)
Anion gap: 9 (ref 5–15)
BUN: 9 mg/dL (ref 8–23)
CO2: 21 mmol/L — ABNORMAL LOW (ref 22–32)
Calcium: 8.3 mg/dL — ABNORMAL LOW (ref 8.9–10.3)
Chloride: 103 mmol/L (ref 98–111)
Creatinine, Ser: 0.48 mg/dL (ref 0.44–1.00)
GFR, Estimated: >60 mL/min (ref >60)
Glucose, Bld: 131 mg/dL — ABNORMAL HIGH (ref 70–99)
Potassium: 3.5 mmol/L (ref 3.5–5.1)
Sodium: 133 mmol/L — ABNORMAL LOW (ref 135–145)
Total Bilirubin: 0.9 mg/dL (ref 0.0–1.2)
Total Protein: 5.6 g/dL — ABNORMAL LOW (ref 6.5–8.1)

## 2023-05-20 LAB — GLUCOSE, CAPILLARY
Glucose-Capillary: 107 mg/dL — ABNORMAL HIGH (ref 70–99)
Glucose-Capillary: 126 mg/dL — ABNORMAL HIGH (ref 70–99)
Glucose-Capillary: 136 mg/dL — ABNORMAL HIGH (ref 70–99)
Glucose-Capillary: 153 mg/dL — ABNORMAL HIGH (ref 70–99)
Glucose-Capillary: 167 mg/dL — ABNORMAL HIGH (ref 70–99)

## 2023-05-20 LAB — PHOSPHORUS: Phosphorus: 2.5 mg/dL (ref 2.5–4.6)

## 2023-05-20 LAB — MAGNESIUM: Magnesium: 1.8 mg/dL (ref 1.7–2.4)

## 2023-05-20 LAB — TRIGLYCERIDES: Triglycerides: 142 mg/dL (ref <150)

## 2023-05-20 MED ORDER — CO Q-10 200 MG PO CAPS: 200.0000 mg | ORAL_CAPSULE | Freq: Every day | ORAL | Status: DC

## 2023-05-20 MED ORDER — BOOST / RESOURCE BREEZE PO LIQD CUSTOM
1.0000 | Freq: Two times a day (BID) | ORAL | Status: DC
  Administered 2023-05-20 – 2023-05-21 (×2): 1 via ORAL

## 2023-05-20 MED ORDER — MAGNESIUM SULFATE 2 GM/50ML IV SOLN
2.0000 g | Freq: Once | INTRAVENOUS | Status: AC
  Administered 2023-05-20: 2 g via INTRAVENOUS
  Filled 2023-05-20: qty 50

## 2023-05-20 MED ORDER — SALINE SPRAY 0.65 % NA SOLN
1.0000 | NASAL | Status: DC | PRN
  Administered 2023-05-20: 1 via NASAL
  Filled 2023-05-20: qty 44

## 2023-05-20 MED ORDER — PROCHLORPERAZINE EDISYLATE 10 MG/2ML IJ SOLN
5.0000 mg | INTRAMUSCULAR | Status: AC | PRN
  Administered 2023-05-20: 5 mg via INTRAVENOUS
  Filled 2023-05-20: qty 2

## 2023-05-20 MED ORDER — POTASSIUM CHLORIDE 10 MEQ/100ML IV SOLN
10.0000 meq | INTRAVENOUS | Status: AC
  Administered 2023-05-20 (×4): 10 meq via INTRAVENOUS
  Filled 2023-05-20 (×4): qty 100

## 2023-05-20 MED ORDER — PANTOPRAZOLE SODIUM 40 MG IV SOLR
40.0000 mg | INTRAVENOUS | Status: DC
  Administered 2023-05-20 – 2023-05-22 (×3): 40 mg via INTRAVENOUS
  Filled 2023-05-20 (×3): qty 10

## 2023-05-20 MED ORDER — MENTHOL 3 MG MT LOZG
1.0000 | LOZENGE | OROMUCOSAL | Status: DC | PRN
  Administered 2023-05-20: 3 mg via ORAL
  Filled 2023-05-20: qty 9

## 2023-05-20 MED ORDER — ROSUVASTATIN CALCIUM 10 MG PO TABS
20.0000 mg | ORAL_TABLET | Freq: Every day | ORAL | Status: DC
  Administered 2023-05-21 – 2023-05-31 (×11): 20 mg via ORAL
  Filled 2023-05-20 (×11): qty 2

## 2023-05-20 MED ORDER — TRAVASOL 10 % IV SOLN
INTRAVENOUS | Status: AC
  Filled 2023-05-20: qty 748.8

## 2023-05-20 MED ORDER — VITAMIN C 500 MG PO TABS
1000.0000 mg | ORAL_TABLET | Freq: Every day | ORAL | Status: DC
  Administered 2023-05-20 – 2023-06-01 (×11): 1000 mg via ORAL
  Filled 2023-05-20 (×11): qty 2

## 2023-05-20 MED ORDER — SODIUM CHLORIDE 0.9 % IV SOLN: INTRAVENOUS | Status: AC

## 2023-05-20 MED ORDER — NIRAPARIB TOSYLATE 200 MG PO TABS: 200.0000 mg | ORAL_TABLET | Freq: Every day | ORAL | Status: DC

## 2023-05-20 MED ORDER — TEMAZEPAM 15 MG PO CAPS: 30.0000 mg | ORAL_CAPSULE | Freq: Every evening | ORAL | Status: DC | PRN

## 2023-05-20 NOTE — Plan of Care (Signed)

## 2023-05-20 NOTE — Progress Notes (Signed)
 [facility] Liaison Note  05/20/2023  [name] 07-24-1945 [id number]  Location: RN Hospital Liaison screened the patient remotely at [facility].  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 The patient was screened for readmission hospitalization with noted high risk score for unplanned readmission risk with 4 IP in 6 months.  The patient was assessed for potential Care Management service needs for post hospital transition for care coordination. Review of patient's electronic medical record reveals patient  was admitted for partial small bowel obstruction. No anticipated needs presented at this time.   Plan: [facility] Liaison will continue to follow progress and disposition to asess for post hospital community care coordination/management needs.  Referral request for community care coordination: pending disposition.   VBCI Care Management/Population Health does not replace or interfere with any arrangements made by the Inpatient Transition of Care team.   For questions contact:   Lilla Reichert, RN, BSN Hospital Liaison [place]   [facility], Population Health Office Hours MTWF  8:00 am-6:00 pm Direct Dial: [phone] mobile [email or web address]

## 2023-05-20 NOTE — Progress Notes (Signed)
 PHARMACY - TOTAL PARENTERAL NUTRITION CONSULT NOTE   Indication: Prolonged ileus  Patient Measurements: Height: 5\' 2"  (157.5 cm) Weight: 72.6 kg (160 lb) IBW/kg (Calculated) : 50.1 TPN AdjBW (KG): 55.7 Body mass index is 29.26 kg/m. 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 Abdominal CT on 05/16/23 showed findings with concern for SBO and abd xray on 4/19 showed partial SBO. Pharmacy has been consulted on 05/19/23 to start TPN for prolonged ileus.  Glucose / Insulin : no hx DM; not on insulin   - cbgs (goal <150): wnl Electrolytes: K 3.5 (low), Mag 1.8 (low), CorrCa 9.3 (WNL), CO2 21 (low), Na 133 (slightly low), phos 2.5 (low end of normal range) - Goal for ileus: Mag >2, K >4 Renal: Scr/BUN WNL Hepatic: AST 12 (slightly low), albumin  2.7 (low), ALT, alk phos, and Tbili WNL Intake / Output; MIVF: NS @ 92ml/hr - I/O total: - 550 mL - NG output: - 450 mL  - UOP: 2x - Stool output: 1x (colostomy) GI Imaging: - 4/17 abd CT: Findings concerning for early or intermittent small bowel obstruction with transition at the level of prior ileocolic anastomosis. Bilateral pleural effusions  - 4/19 abb xray: Findings consistent with a partial small bowel obstruction   GI Surgeries / Procedures:   Central access: Port-a- cath  TPN start date: 05/19/23  Nutritional Goals: Goal TPN rate is 80 mL/hr (provides 99.8 g of protein and 1896 kcals per day)  RD Assessment:  Estimated Needs Total Energy Estimated Needs: 1750-2000 Total Protein Estimated Needs: 90-115 Total Fluid Estimated  Needs: >1.75 L/day or per MD  Current Nutrition:  TPN, CLD (starting 4/21)  Plan:  Now:  - Magnesium  sulfate 2gm IV x1 - IV KCl 10mEq x4  At 1800:  - Increase TPN to 60 mL/hr (provides 74.8 g protein and 1421 kcals per day) - Electrolytes in TPN:  Na 57mEq/L - increase K 50mEq/L Ca 27mEq/L Mg 66mEq/L Phos 15mmol/L Cl:Ac 1:2 - Add standard MVI and trace elements to TPN - Initiate Sensitive q6h SSI and adjust as needed  - Reduce MIVF to 40 mL/hr at 1800 - At minimum, monitor TPN labs on Mon/Thurs.       - CMP, Mg, Phos tomorrow AM - Thiamine  100 mg IV daily x5 days (4/20 - 4/24)   Roselee Cong, PharmD Clinical Pharmacist  4/21/20258:25 AM

## 2023-05-20 NOTE — Progress Notes (Signed)
 Prior-To-Admission Oral Chemotherapy for Treatment of Oncologic Disease   Order noted from Dr. Lesa Rape to continue prior-to-admission oral chemotherapy regimen of niraparib  tosylate (Zejula ) .  Procedure Per Pharmacy & Therapeutics Committee Policy: Orders for continuation of home oral chemotherapy for treatment of an oncologic disease will be held unless approved by an oncologist during current admission.    For patients receiving oncology care at [facility], inpatient pharmacist contacts patient's oncologist during regular office hours to review. If earlier review is medically necessary, attending physician consults [facility] on-call oncologist   For patients receiving oncology care outside of [facility], attending physician consults patient's oncologist to review. If this oncologist or their coverage cannot be reached, attending physician consults [facility] on-call oncologist   Oral chemotherapy continuation order is on hold pending oncologist review, [facility] oncologist Emiliano Hard will be notified by inpatient pharmacy during office hours    Rubie Corona, Pharm.D Use secure chat for questions 05/20/2023 5:39 PM

## 2023-05-20 NOTE — Progress Notes (Signed)
 PROGRESS NOTE Rachel Carlson  FAO:[id number] DOB: 1945-05-10 DOA: 05/16/2023 PCP: Rachel Curt, MD  Brief Narrative/Hospital Course: 78 year old female with ovarian cancer and carcinomatosis s/p ex lap with tumor debulking, total omentectomy, SBR with 3 anastomosis, resection of ovary and rectosigmoid., DVT, GERD admitted last night for possible partial small bowel obstruction with probable transition point at the level of colonic anastomosis. 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  Continue PPI for bloody NG tube output.  Continue holding Eliquis  for now. Per CCS patient with difficult and higher risks for surgical intervention  Hypokalemia: Resolved  Hyponatremia: Due to SBO continue IV fluids  Painful swallowing: Having pain in right of the thyroid  cartilage after NG tube placement . -no significant tenderness or swelling noted no crepitus and lungs are clear Continue symptomatic management -hopefully improve after removal of NG tube.  Added Cepacol  Hypertension: BP stable Home meds remain on hold, cont metoprolol  IV push twice daily   DVT prophylaxis: enoxaparin  (LOVENOX ) injection 40 mg Start: 05/16/23 2200 Code Status:    Code Status: Full Code Family Communication: plan of care discussed with patient at bedside. Patient status is: Remains hospitalized because of severity of illness Level of care: Med-Surg   Dispo: The patient is from: home            Anticipated disposition: TBD  Objective: Vitals last 24 hrs: Vitals:   05/19/23 1420 05/19/23 2039 05/20/23 0406 05/20/23 1147  BP: (!) 151/73 (!) 145/67 (!) 154/77 (!) 129/53  Pulse: 92 100 99 91  Resp:  18  16  Temp: 98.3 F (36.8 C) 98.2 F (36.8 C) 98.5 F (36.9 C) 98.2 F (36.8 C)  TempSrc:  Oral Oral Oral  SpO2: 97% 96% 96% 96%  Weight:      Height:       Weight change:   Physical Examination: General exam: alert awake, older than stated age HEENT:Oral mucosa moist, Ear/Nose WNL grossly Respiratory system: Bilaterally diminished BS, no use of accessory muscle Cardiovascular system: S1 & S2 +. Gastrointestinal system: Abdomen soft, NT,ND,BS+ Nervous System: Alert, awake,following commands. Extremities: LE edema neg, moving armslegs, warm legs Skin: No rashes,warm. MSK: Normal muscle bulk/tone.   Medications reviewed:  Scheduled Meds:  Chlorhexidine  Gluconate Cloth  6 each Topical Daily   enoxaparin  (LOVENOX ) injection  40 mg Subcutaneous Q24H   insulin  aspart  0-9 Units Subcutaneous Q6H   metoprolol  tartrate  5 mg Intravenous Q12H   pantoprazole  (PROTONIX ) IV  40 mg Intravenous Q24H   thiamine  (VITAMIN B1) injection  100 mg Intravenous Daily   Continuous Infusions:  sodium chloride  60 mL/hr at 05/20/23 0337   sodium chloride      magnesium  sulfate bolus IVPB     TPN ADULT (ION) 40 mL/hr at 05/19/23 1748   TPN ADULT (ION)  Diet Order             Diet clear liquid Room service appropriate? Yes; Fluid consistency: Thin  Diet effective now                   Nutrition Problem: Inadequate oral intake Etiology: other (see comment) (prolonged ileus) Signs/Symptoms: NPO status Interventions: TPN  Intake/Output  Summary (Last 24 hours) at 05/20/2023 1419 Last data filed at 05/20/2023 1418 Gross per 24 hour  Intake 240 ml  Output 300 ml  Net -60 ml   Net IO Since Admission: -2,358.58 mL [05/20/23 1419]  Wt Readings from Last 3 Encounters:  05/16/23 72.6 kg  05/13/23 72.8 kg  05/10/23 71.7 kg    Unresulted Labs (From admission, onward)     Start     Ordered   05/21/23 0500  Comprehensive metabolic panel  Daily,   R     Question:  Specimen collection method  Answer:  IV Team=IV Team collect  Placed in "And" Linked Group   05/20/23 0855   05/21/23 0500  Magnesium   Daily,   R     Question:  Specimen collection method  Answer:  IV Team=IV Team collect  Placed in "And" Linked Group   05/20/23 0855   05/21/23 0500  Phosphorus  Daily,   R     Question:  Specimen collection method  Answer:  IV Team=IV Team collect  Placed in "And" Linked Group   05/20/23 0855   05/20/23 0500  Comprehensive metabolic panel  (Standard TPN Labs (all labs to be drawn at 0500))  Every Mon,Thu (0500),   R     Question:  Specimen collection method  Answer:  IV Team=IV Team collect   05/19/23 0920   05/20/23 0500  Magnesium   (Standard TPN Labs (all labs to be drawn at 0500))  Every Mon,Thu (0500),   R     Question:  Specimen collection method  Answer:  IV Team=IV Team collect   05/19/23 0920   05/20/23 0500  Phosphorus  (Standard TPN Labs (all labs to be drawn at 0500))  Every Mon,Thu (0500),   R     Question:  Specimen collection method  Answer:  IV Team=IV Team collect   05/19/23 0920   05/20/23 0500  Triglycerides  (Standard TPN Labs (all labs to be drawn at 0500))  Every Monday (0500),   R     Question:  Specimen collection method  Answer:  IV Team=IV Team collect   05/19/23 0920          Data Reviewed: I have personally reviewed following labs and imaging studies ( see epic result tab) CBC: Recent Labs  Lab 05/16/23 1031 05/17/23 0311 05/17/23 1020 05/18/23 0935  WBC 8.6 2.9* 2.9* 4.1  NEUTROABS 7.3   --  1.5* 2.3  HGB 13.4 11.3* 11.1* 12.4  HCT 40.4 35.8* 34.1* 38.3  MCV 94.4 98.1 97.4 96.5  PLT 236 188 186 219   CMP: Recent Labs  Lab 05/16/23 1031 05/17/23 0311 05/18/23 0458 05/19/23 0414 05/20/23 0424  NA 136 136 136 136 133*  K 3.1* 3.9 3.4* 3.3* 3.5  CL 107 106 107 105 103  CO2 19* 23 21* 20* 21*  GLUCOSE 136* 117* 106* 77 131*  BUN 10 14 14 12 9   CREATININE 0.51 0.59 0.62 0.53 0.48  CALCIUM  8.4* 8.4* 8.0* 8.1* 8.3*  MG 1.9  --   --  1.8 1.8  PHOS 3.1  --   --  3.0 2.5   GFR: Estimated Creatinine Clearance: 56.7 mL/min (by C-G formula based on SCr of 0.48 mg/dL). Recent Labs  Lab 05/16/23 1031 05/17/23 0311 05/20/23 0424  AST 19 13* 12*  ALT 12 11 9   ALKPHOS 72 63 51  BILITOT 0.9 1.0 0.9  PROT 6.3* 5.8* 5.6*  ALBUMIN  3.5 3.0* 2.7*   Recent Labs  Lab 05/16/23 1031  LIPASE 24   No results for input(s): "AMMONIA" in the last 168 hours. Coagulation Profile: No results for input(s): "INR", "PROTIME" in the last 168 hours. No results for input(s): "PROBNP" in the last 168 hours.  No results for input(s): "HGBA1C" in the last 72 hours. Recent Labs  Lab 05/19/23 1807 05/20/23 0011 05/20/23 0549 05/20/23 1212  GLUCAP 83 107* 136* 153*   Recent Labs    05/20/23 0424  TRIG 142   No results for input(s): "TSH", "T4TOTAL", "FREET4", "T3FREE", "THYROIDAB" in the last 72 hours. Sepsis Labs:No results for input(s): "PROCALCITON", "LATICACIDVEN" in the last 168 hours. Recent Results (from the past 240 hours)  Urine Culture (for pregnant, neutropenic or urologic patients or patients with an indwelling urinary catheter)     Status: None   Collection Time: 05/17/23 11:35 PM   Specimen: Urine, Clean Catch  Result Value Ref Range Status   Specimen Description   Final    URINE, CLEAN CATCH Performed at [facility], [address] [place], [place] [address]    Special Requests   Final    NONE Performed at [facility], [address], [place], [place] [address]    Culture   Final    NO GROWTH Performed at [facility], [address], [place], [place] [address]    Report Status 05/19/2023 FINAL  Final  Antimicrobials/Microbiology: Anti-infectives (From admission, onward)    None         Component Value Date/Time   SDES  05/17/2023 2335    URINE, CLEAN CATCH Performed at [facility], [address], [place], [place] [address]    SPECREQUEST  05/17/2023 2335    NONE Performed at [facility], [address], [place], [place] [address]    CULT  05/17/2023 2335    NO GROWTH Performed at [facility], [address] [place], [place] [address]    REPTSTATUS 05/19/2023 FINAL 05/17/2023 2335    Radiology Studies: DG Abd Portable 1V-Small Bowel Obstruction Protocol-initial, 8 hr delay Result Date: 05/18/2023 CLINICAL DATA:  Small-bowel follow-through. EXAM: PORTABLE ABDOMEN - 1 VIEW COMPARISON:  May 17, 2023 (5:19 a.m.) FINDINGS: A kinked enteric tube is seen with its distal tip overlying the expected region of the gastroesophageal junction. This is unchanged in appearance when compared to the prior study. Persistent dilated, air distended loops of small bowel are seen within the mid abdomen. Radiopaque contrast is also seen within the ascending and descending colon. No radio-opaque calculi are seen. IMPRESSION: Findings consistent with a partial small bowel obstruction. Electronically Signed   By: Virgle Grime M.D.   On: 05/18/2023 22:10    LOS: 4 days  Total time spent in review of labs and imaging, patient evaluation, formulation of plan, documentation and communication with patient/family: 35 minutes  Lesa Rape, MD [facility] 05/20/2023, 2:19 PM

## 2023-05-20 NOTE — Consult Note (Addendum)
 WOC Nurse ostomy consult note Pt is familiar to [facility] team from previous admissions.  She had ostomy surgery 11/24 and is independent with pouch application and emptying prior to admission.  She is followed in the outpatient ostomy clinic for stoma prolapse.  Currently, stoma is red and viable with moderate prolapse, opening cut to 2 inches. Applied barrier ring and one piece convex pouch.  50cc liquid brown stool in the pouch. She used stoma powder and skin prep wipes to crust the skin, which is intact although previous pouch was leaking behind the barrier.  Applied belt.   3 sets of each supply ordered to the room for patient and staff nurse use.  Use Supplies: barrier ring Lawson # [id number] and convex pouch [id number] She requests another pouch change be performed on Thurs if she is still in the hospital at that time.  Thank-you,  Wiliam Harder MSN, RN, CWOCN, [place], CNS [phone]

## 2023-05-20 NOTE — Progress Notes (Signed)
 Nutrition Follow-up  DOCUMENTATION CODES:   Not applicable  INTERVENTION:  - Plan to continue TPN.   - TPN management per pharmacy.   - Monitor magnesium , potassium, and phosphorus BID for at least 3 days, MD to replete as needed. - Continue 100mg  thiamine  x5 days.  - Clear liquid diet per CCS.  - Add Boost Breeze po TID, each supplement provides 250 kcal and 9 grams of protein  - Daily weights while on TPN.    NUTRITION DIAGNOSIS:   Inadequate oral intake related to other (see comment) (prolonged ileus) as evidenced by NPO status. 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 Abdominal CT on 05/16/23 showed findings with concern for SBO and abd xray on 4/19 showed partial SBO.  4/17 Admit; NGT placed for suction 4/18 CLD 4/19 NPO 4/20 TPN Initiated  4/21 CLD  Patient in bed at time of visit, NGT clamped as of this AM.  She reports a recent UBW of 160# but and weight loss last year but stable over the past few months.  Per EMR, patient lost from 194# in April to 159# by November. This is a 35# or 18% weight loss in 8 months, which is significant for the time frame. However, weight has been stable since that time. Patient confirms these trends.   She endorses eating fairly well PTA with 2-3 meals a day. Also drinking 1 Ensure Complete daily. Notes she has been eating less overall recently but was  eating up until the night before admission.   She has had very little to eat since admit. Advanced to clear liquids today and she feels up to trying to drink something but notes a pain in her throat with swallowing. Reports this is new since admission and makes it very difficult to drink anything. MD aware of this and is hopeful this will improve after NGT removal.  For now, patient agreeable to try Boost Breeze to support oral intake. Would like to transition to Ensure once diet advanced further.   Per CCS, plan to continue TPN for now.   Medications reviewed and include: 100mg  thiamine   Labs reviewed:  Na 133 Triglycerides 142 (as of today)   NUTRITION - FOCUSED PHYSICAL EXAM:  Flowsheet Row Most Recent Value  Orbital Region No depletion  Upper Arm Region No depletion  Thoracic and Lumbar Region No depletion  Buccal Region No depletion  Temple Region Mild depletion  Clavicle Bone Region No depletion  Clavicle and Acromion Bone Region No depletion  Scapular Bone Region Unable to assess  Dorsal Hand No depletion  Patellar Region No depletion  Anterior Thigh Region No depletion  Posterior Calf Region No depletion  Edema (RD Assessment) None  Hair Reviewed  Eyes Reviewed  Mouth Reviewed  Skin Reviewed  Nails Reviewed       Diet Order:   Diet Order             Diet clear liquid Room service appropriate? Yes; Fluid  consistency: Thin  Diet effective now                   EDUCATION NEEDS:  Not appropriate for education at this time  Skin:  Skin Assessment: Reviewed RN Assessment  Last BM:  4/21 - colostomy  Height:  Ht Readings from Last 1 Encounters:  05/16/23 5\' 2"  (1.575 m)   Weight:  Wt Readings from Last 1 Encounters:  05/16/23 72.6 kg    BMI:  Body mass index is 29.26 kg/m.  Estimated Nutritional Needs:  Kcal:  1750-2000 Protein:  90-115 Fluid:  >1.75 L/day or per MD    Scheryl Cushing RD, LDN Contact via Secure Chat.

## 2023-05-20 NOTE — Progress Notes (Addendum)
 Gynecologic Oncology Progress Note   Subjective: Patient reports moderate throat soreness, more on the left side. Reports having to spit up secretions around the NG tube frequently. Has had intermittent abdominal cramping that is tolerable. Reports minimal flatus and output in ostomy overnight. Would like ostomy team to check in. No nausea or emesis reported this am. Denies chest pain, dyspnea. Urinating without difficulty and states the burning at the urethra has improved. Feels steady when out of bed.   Objective: Vital signs in last 24 hours: Temp:  [98 F (36.7 C)-98.5 F (36.9 C)] 98.5 F (36.9 C) (04/21 0406) Pulse Rate:  [92-100] 99 (04/21 0406) Resp:  [16-18] 18 (04/20 2039) BP: (145-154)/(67-77) 154/77 (04/21 0406) SpO2:  [96 %-97 %] 96 % (04/21 0406) Last BM Date : 05/18/23  Intake/Output from previous day: 04/20 0701 - 04/21 0700 In: 0  Out: 550 [Emesis/NG output:450; Stool:100]  Physical Examination: General: alert, cooperative, and no distress Resp: clear to auscultation bilaterally Cardio: regular rate and rhythm, S1, S2 normal, no murmur, click, rub or gallop GI: hypoactive bowel sounds, NG tube in place and currently clamped, abdomen mildly tympanic, soft, ostomy with small amount of gas and liquid stool, stoma remains prolapsed/non-edematous/stoma pink and viable Extremities: extremities normal, atraumatic, no cyanosis or edema Right chest port accessed with intact dressing with no surrounding erythema with TPN infusing.  Labs:    Latest Ref Rng & Units 05/18/2023    9:35 AM 05/17/2023   10:20 AM 05/17/2023    3:11 AM  CBC  WBC 4.0 - 10.5 K/uL 4.1  2.9  2.9   Hemoglobin 12.0 - 15.0 g/dL 16.1  09.6  04.5   Hematocrit 36.0 - 46.0 % 38.3  34.1  35.8   Platelets 150 - 400 K/uL 219  186  188       Latest Ref Rng & Units 05/20/2023    4:24 AM 05/19/2023    4:14 AM 05/18/2023    4:58 AM  BMP  Glucose 70 - 99 mg/dL 409  77  811   BUN 8 - 23 mg/dL 9  12  14     Creatinine 0.44 - 1.00 mg/dL 9.14  7.82  9.56   Sodium 135 - 145 mmol/L 133  136  136   Potassium 3.5 - 5.1 mmol/L 3.5  3.3  3.4   Chloride 98 - 111 mmol/L 103  105  107   CO2 22 - 32 mmol/L 21  20  21    Calcium  8.9 - 10.3 mg/dL 8.3  8.1  8.0    03/13/06: KUB FINDINGS: A kinked enteric tube is seen with its distal tip overlying the expected region of the gastroesophageal junction. This is unchanged in appearance when compared to the prior study. Persistent dilated, air distended loops of small bowel are seen within the mid abdomen. Radiopaque contrast is also seen within the ascending and descending colon. No radio-opaque calculi are seen. IMPRESSION: Findings consistent with a partial small bowel obstruction.  Assessment: 78 y.o. admitted with partial small bowel obstruction with transition at the level of prior ileocolic anastomosis. 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 Now s/p 4 cycles of adj C/T (last on 03/25/23). She is currently on Zejula .   Partial SBO: Continued ostomy output. No NG tube output documented in chart over past 24 hours. NG clamping trial per General Surg team started this  am. Per Dr. Orvil Bland on 05/19/23, if no decrease in NG output or improvement in ostomy output over the next 1-2 days, it would be reasonable to start dexamethasone .  In the setting of known malignancy, this may be be a malignant partial small bowel obstruction (caused by small volume/peritoneal disease). GI prophylaxis ordered this am by Gen Surg while NGT in place. Per Dr. Daisey Dryer, if successful clamping trial and NG is removed, recommend GI regimen when taking PO.   Dysuria: Improved per pt. Pyridium  ordered yesterday, ok to clamp tube for patient to take. Urine culture negative.    Peritoneal carcinoma: Hold PARPi until tolerating PO.   Stoma prolapse:  Stoma healthy, much less edematous than during recent prior admission.  Patient is scheduled to see Dr. Camilo Cella at the end of the month.   Leukopenia: resolved.   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 Repeat doppler in 10/2022 negative for DVT. Per Dr. Orvil Bland, ok for patient to be on prophylactic lovenox  during hospital stay, can restart Eliquis  on discharge.   LOS: 4 days    Rachel Carlson 05/20/2023, 8:53 AM

## 2023-05-20 NOTE — Hospital Course (Addendum)
 Brief Narrative/Hospital Course: 78 year old female with ovarian cancer and carcinomatosis s/p ex lap with tumor debulking, total omentectomy, SBR with 3 anastomosis, resection of ovary and rectosigmoid., DVT, GERD admitted last night for possible partial small bowel obstruction with probable transition point at the level of colonic anastomosis. 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 History of ex lap with radical tumor debulking surgery discharge various resections and colostomy 11/2022: CT concerning for early or intermittent SBO with transition at the level of prior ileocolic anastomosis, managing conservatively past contrast to the colon>s/p NG clamp trial and removed 4/21-on CLD and on TPN x-ray 4/24-similar persistent small bowel dilatation stable  On FLD- Diet-advanced to soft  4/25-but having nausea and vomiting, ostomy still functioning, per surgery concern for progressive mild obstruction from carcinomatosis and progression of cancer-for now continue soft diet May require NG tube decompression per CCS-also advised goals of care discussion by GYN Continue current MiraLAX  bid senna bid and stomal suppository Continue holding Eliquis  for now until cleared by CCS. Per CCS patient with difficult and higher risks for surgical intervention Per GYN oncology Decadron  has been started 4/23 as suspecting it is malignant partial small bowel obstruction due to small volume and peritoneal disease.    Hypokalemia Hyponatremia: Resolved.   Painful swallowing Throat pain: Likely in the setting of NG tube insertion/trauma-much improved following NG tube removal.     Hypertension: BP stable. PTA not taking metoprolol -discontinued.    DVT prophylaxis: enoxaparin  (LOVENOX ) injection 40 mg Start: 05/16/23 2200 Code Status:   Code Status: Full Code Family Communication: plan of care discussed with patient at bedside. Patient status is: Remains hospitalized because of severity of illness Level of care: Med-Surg   Dispo: The patient is from: HOME            Anticipated disposition: TBD

## 2023-05-20 NOTE — Progress Notes (Signed)
 Progress Note     Subjective: Pt reports she is still having some cramping but had a lot of gas from stoma yesterday. Still having some loose stool output.   Objective: Vital signs in last 24 hours: Temp:  [98 F (36.7 C)-98.5 F (36.9 C)] 98.5 F (36.9 C) (04/21 0406) Pulse Rate:  [92-100] 99 (04/21 0406) Resp:  [16-18] 18 (04/20 2039) BP: (145-154)/(67-77) 154/77 (04/21 0406) SpO2:  [96 %-97 %] 96 % (04/21 0406) Last BM Date : 05/18/23  Intake/Output from previous day: 04/20 0701 - 04/21 0700 In: 0  Out: 550 [Emesis/NG output:450; Stool:100] Intake/Output this shift: No intake/output data recorded.  PE: General: pleasant, WD, WN female who is laying in bed in NAD HEENT: left nasal incision well healing, mild bruising to left face/neck Heart: regular, rate, and rhythm.  Lungs:  Respiratory effort nonlabored Abd: soft, NT, ND, stoma viable with mild prolapse and liquid stool; NGT output bloody  Psych: A&Ox3 with an appropriate affect.    Lab Results:  Recent Labs    05/17/23 1020 05/18/23 0935  WBC 2.9* 4.1  HGB 11.1* 12.4  HCT 34.1* 38.3  PLT 186 219   BMET Recent Labs    05/19/23 0414 05/20/23 0424  NA 136 133*  K 3.3* 3.5  CL 105 103  CO2 20* 21*  GLUCOSE 77 131*  BUN 12 9  CREATININE 0.53 0.48  CALCIUM  8.1* 8.3*   PT/INR No results for input(s): "LABPROT", "INR" in the last 72 hours. CMP     Component Value Date/Time   NA 133 (L) 05/20/2023 0424   K 3.5 05/20/2023 0424   CL 103 05/20/2023 0424   CO2 21 (L) 05/20/2023 0424   GLUCOSE 131 (H) 05/20/2023 0424   BUN 9 05/20/2023 0424   CREATININE 0.48 05/20/2023 0424   CREATININE 0.64 04/26/2023 1043   CALCIUM  8.3 (L) 05/20/2023 0424   PROT 5.6 (L) 05/20/2023 0424   ALBUMIN  2.7 (L) 05/20/2023 0424   AST 12 (L) 05/20/2023 0424   AST 11 (L) 04/26/2023 1043   ALT 9 05/20/2023 0424   ALT 7 04/26/2023 1043   ALKPHOS 51 05/20/2023 0424   BILITOT 0.9 05/20/2023 0424   BILITOT 0.7 04/26/2023  1043   GFRNONAA >60 05/20/2023 0424   GFRNONAA >60 04/26/2023 1043   GFRAA 94 06/04/2007 1522   Lipase     Component Value Date/Time   LIPASE 24 05/16/2023 1031       Studies/Results: DG Abd Portable 1V-Small Bowel Obstruction Protocol-initial, 8 hr delay Result Date: 05/18/2023 CLINICAL DATA:  Small-bowel follow-through. EXAM: PORTABLE ABDOMEN - 1 VIEW COMPARISON:  May 17, 2023 (5:19 a.m.) FINDINGS: A kinked enteric tube is seen with its distal tip overlying the expected region of the gastroesophageal junction. This is unchanged in appearance when compared to the prior study. Persistent dilated, air distended loops of small bowel are seen within the mid abdomen. Radiopaque contrast is also seen within the ascending and descending colon. No radio-opaque calculi are seen. IMPRESSION: Findings consistent with a partial small bowel obstruction. Electronically Signed   By: Virgle Grime M.D.   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 Orvil Bland   - CT scan concerning for early or intermittent small bowel obstruction with transition at the level of the prior ileocolic anastomosis - Partial SBO, as patient continues to have ostomy output - passed contrast into colon 4/19  - clamp NGT and allow CLD this AM, continue TPN for now - added protonix  for bloody NGT output - reaction of diarrhea noted as intolerance   Surgical intervention would be difficult and higher risk due to recent chemotherapy and peritoneal carcinomatosis.     FEN: clamping, CLD, TPN ID:  None VTE: Please hold Eliquis .  Okay for heparin  drip if clinically indicated   LOS: 4 days   I reviewed Consultant GYN-ONC notes, hospitalist notes, last 24 h vitals and pain scores, last 48 h intake and output, last 24 h labs and trends, and last 24 h imaging results.  This care required moderate level of medical decision making.    Annetta Killian, [facility] 05/20/2023, 8:19 AM Please see Amion for pager number during day hours 7:00am-4:30pm

## 2023-05-21 DIAGNOSIS — K56600 Partial intestinal obstruction, unspecified as to cause: Secondary | ICD-10-CM | POA: Diagnosis not present

## 2023-05-21 LAB — COMPREHENSIVE METABOLIC PANEL WITH GFR
ALT: 8 U/L (ref 0–44)
AST: 11 U/L — ABNORMAL LOW (ref 15–41)
Albumin: 2.6 g/dL — ABNORMAL LOW (ref 3.5–5.0)
Alkaline Phosphatase: 51 U/L (ref 38–126)
Anion gap: 6 (ref 5–15)
BUN: 9 mg/dL (ref 8–23)
CO2: 24 mmol/L (ref 22–32)
Calcium: 8.2 mg/dL — ABNORMAL LOW (ref 8.9–10.3)
Chloride: 102 mmol/L (ref 98–111)
Creatinine, Ser: 0.44 mg/dL (ref 0.44–1.00)
GFR, Estimated: >60 mL/min (ref >60)
Glucose, Bld: 154 mg/dL — ABNORMAL HIGH (ref 70–99)
Potassium: 3.3 mmol/L — ABNORMAL LOW (ref 3.5–5.1)
Sodium: 132 mmol/L — ABNORMAL LOW (ref 135–145)
Total Bilirubin: 0.6 mg/dL (ref 0.0–1.2)
Total Protein: 5.4 g/dL — ABNORMAL LOW (ref 6.5–8.1)

## 2023-05-21 LAB — MAGNESIUM: Magnesium: 2.1 mg/dL (ref 1.7–2.4)

## 2023-05-21 LAB — GLUCOSE, CAPILLARY
Glucose-Capillary: 147 mg/dL — ABNORMAL HIGH (ref 70–99)
Glucose-Capillary: 129 mg/dL — ABNORMAL HIGH (ref 70–99)
Glucose-Capillary: 144 mg/dL — ABNORMAL HIGH (ref 70–99)
Glucose-Capillary: 157 mg/dL — ABNORMAL HIGH (ref 70–99)

## 2023-05-21 LAB — PHOSPHORUS: Phosphorus: 3.1 mg/dL (ref 2.5–4.6)

## 2023-05-21 MED ORDER — POLYETHYLENE GLYCOL 3350 17 G PO PACK
34.0000 g | PACK | Freq: Two times a day (BID) | ORAL | Status: DC
  Administered 2023-05-21 – 2023-06-01 (×18): 34 g via ORAL
  Filled 2023-05-21 (×20): qty 2

## 2023-05-21 MED ORDER — BISACODYL 10 MG RE SUPP
10.0000 mg | Freq: Every day | RECTAL | Status: AC
  Administered 2023-05-21 – 2023-05-23 (×3): 10 mg via RECTAL
  Filled 2023-05-21 (×3): qty 1

## 2023-05-21 MED ORDER — SENNA 8.6 MG PO TABS
1.0000 | ORAL_TABLET | Freq: Two times a day (BID) | ORAL | Status: DC
  Administered 2023-05-21 – 2023-06-01 (×21): 8.6 mg via ORAL
  Filled 2023-05-21 (×21): qty 1

## 2023-05-21 MED ORDER — SODIUM CHLORIDE 0.9 % IV SOLN: INTRAVENOUS | Status: AC

## 2023-05-21 MED ORDER — POTASSIUM ACETATE 2 MEQ/ML IV SOLN
INTRAVENOUS | Status: AC
  Filled 2023-05-21: qty 998.4

## 2023-05-21 MED ORDER — POTASSIUM CHLORIDE CRYS ER 20 MEQ PO TBCR
40.0000 meq | EXTENDED_RELEASE_TABLET | Freq: Once | ORAL | Status: DC
Start: 2023-05-21 — End: 2023-05-21

## 2023-05-21 MED ORDER — POTASSIUM CHLORIDE 10 MEQ/50ML IV SOLN
10.0000 meq | INTRAVENOUS | Status: AC
  Administered 2023-05-21 (×4): 10 meq via INTRAVENOUS
  Filled 2023-05-21 (×4): qty 50

## 2023-05-21 NOTE — Progress Notes (Signed)
 PHARMACY - TOTAL PARENTERAL NUTRITION CONSULT NOTE   Indication: Prolonged ileus  Patient Measurements: Height: 5\' 2"  (157.5 cm) Weight: 71 kg (156 lb 8.4 oz) IBW/kg (Calculated) : 50.1 TPN AdjBW (KG): 55.7 Body mass index is 28.63 kg/m. 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 Abdominal CT on 05/16/23 showed findings with concern for SBO and abd xray on 4/19 showed partial SBO. Pharmacy has been consulted on 05/19/23 to start TPN for prolonged ileus.  Glucose / Insulin : no hx DM; - cbgs (goal <150) 126 - 167 w/ TPN @ 60 ml/hr; 6 units SSI/24 hrs Electrolytes: K 3.3 (low) , Mag 2.1, CorrCa 9.32, CO2 24, Na 132 (slightly low), phos 3.1 - Goal for ileus: Mag >2, K >4 Renal: Scr/BUN WNL Hepatic: albumin  low, ALT, alk phos, and Tbili WNL Intake / Output; MIVF: NS @ 49ml/hr - NGT removed 4/21  - UOP: 3x - Stool output:  151 mls (colostomy) GI Imaging: - 4/17 abd CT: Findings concerning for early or intermittent small bowel obstruction with transition at the level of prior ileocolic anastomosis. Bilateral pleural effusions  - 4/19 abb xray: Findings consistent with a partial small bowel obstruction   GI Surgeries / Procedures:   Central access: Port-a- cath  TPN start date: 05/19/23  Nutritional Goals: Goal TPN rate is 80 mL/hr (provides 99.8 g of protein and 1896 kcals per day)  RD Assessment:  Estimated Needs Total Energy Estimated Needs: 1750-2000 Total Protein Estimated Needs: 90-115 Total Fluid Estimated Needs: >1.75 L/day or per MD  Current Nutrition:  TPN,  CLD (starting 4/21) 4/22 pt reports poor PO intake d/t sore throat  Plan:  Now:  -  IV KCl 10mEq x4 (TRH ordered Kdur 40 but pt unable to take "too painful to swallow"  At 1800:  - Increase TPN to goal rate of 80 mL/hr (provides ~ 100 g protein and 1896 kcals per day) - Electrolytes in TPN:  Na 105mEq/L K 50mEq/L Ca 29mEq/L Mg 46mEq/L Phos 15mmol/L Cl:Ac 1:2 - Add standard MVI and trace elements to TPN  -Thiamine  100 mg IV daily x5 days (4/20 - 4/24) - continue Sensitive q6h SSI and adjust as needed  - Reduce MIVF to 20 mL/hr at 1800 or per MD - At minimum, monitor TPN labs on Mon/Thurs.       - CMP, Mg, Phos tomorrow AM - f/u tolerance to CLD & ability to wean TPN  Rubie Corona, Pharm.D Use secure chat for questions 05/21/2023 8:46 AM

## 2023-05-21 NOTE — Progress Notes (Signed)
 Mobility Specialist - Progress Note   05/21/23 1116  Mobility  Activity Ambulated independently in hallway  Level of Assistance Independent  Assistive Device None  Distance Ambulated (ft) 350 ft  Activity Response Tolerated well  Mobility Referral Yes  Mobility visit 1 Mobility  Mobility Specialist Start Time (ACUTE ONLY) 1058  Mobility Specialist Stop Time (ACUTE ONLY) 1113  Mobility Specialist Time Calculation (min) (ACUTE ONLY) 15 min   Pt received in bed and agreeable to mobility. No complaints during session. Pt to bed after session with all needs met.    [facility]

## 2023-05-21 NOTE — Plan of Care (Signed)

## 2023-05-21 NOTE — Progress Notes (Signed)
 PROGRESS NOTE SKYA MCCULLUM  WUJ:[id number] DOB: Sep 22, 1945 DOA: 05/16/2023 PCP: Clemens Curt, MD  Brief Narrative/Hospital Course: 78 year old female with ovarian cancer and carcinomatosis s/p ex lap with tumor debulking, total omentectomy, SBR with 3 anastomosis, resection of ovary and rectosigmoid., DVT, GERD admitted last night for possible partial small bowel obstruction with probable transition point at the level of colonic anastomosis. Patient was started on conservative management with bowel rest via NG tube    Subjective: Seen and examined Overnight patient afebrile BP stable on room air  Labs shows mild hypokalemia and hyponatremia    Assessment and plan: SBO Peritoneal carcinomatosis on chemo History of ex lap with radical tumor debulking surgery discharge various resections and colostomy 11/2022: CT concerning for early or intermittent SBO with transition at the level of prior ileocolic anastomosis, managing conservatively past contrast to the colon/19, clamped NG tube and removed 4/21 Diet ADAT per CCS and wean off TPN  Continue PPI for bloody NG tube output.  Continue holding Eliquis  for now until cleared by CCS. Per CCS patient with difficult and higher risks for surgical intervention   Hypokalemia: Replaced   Hyponatremia: Due to SBO, encourage oral intake   Painful swallowing: Likely in the setting of NG tube insertion/trauma improving after NG tube removal.   no significant tenderness or swelling noted no crepitus and lungs are clear Continue symptomatic management -hopefully improve after removal of NG tube.  Added Cepacol   Hypertension: BP stable Home meds remain on hold, cont metoprolol  IV push twice daily  DVT prophylaxis: enoxaparin  (LOVENOX ) injection 40 mg Start: 05/16/23 2200 Code Status:   Code Status: Full Code Family Communication: plan of care discussed with patient at bedside. Patient status is: Remains hospitalized because of severity of  illness Level of care: Med-Surg   Dispo: The patient is from: HOME            Anticipated disposition: TBD Objective: Vitals last 24 hrs: Vitals:   05/20/23 1147 05/20/23 1912 05/21/23 0440 05/21/23 1226  BP: (!) 129/53 129/75 127/63 118/80  Pulse: 91 (!) 103 93 94  Resp: 16 18 18 15   Temp: 98.2 F (36.8 C) 99.2 F (37.3 C) 98.4 F (36.9 C) 98.4 F (36.9 C)  TempSrc: Oral Oral Oral Oral  SpO2: 96% 95% 95% 97%  Weight:   71 kg   Height:        Physical Examination: General exam: alert awake, older than stated age HEENT:Oral mucosa moist, Ear/Nose WNL grossly Respiratory system: Bilaterally diminished BS, no use of accessory muscle Cardiovascular system: S1 & S2 +. Gastrointestinal system: Abdomen soft, NT,ND,BS+, colostomy with liquidy stool output Nervous System: Alert, awake,following commands. Extremities: LE edema neg, moving arms LEG, warm legs Skin: No rashes,warm. MSK: Normal muscle bulk/tone.   Data Reviewed: I have personally reviewed following labs and imaging studies ( see epic result tab) CBC: Recent Labs  Lab 05/16/23 1031 05/17/23 0311 05/17/23 1020 05/18/23 0935  WBC 8.6 2.9* 2.9* 4.1  NEUTROABS 7.3  --  1.5* 2.3  HGB 13.4 11.3* 11.1* 12.4  HCT 40.4 35.8* 34.1* 38.3  MCV 94.4 98.1 97.4 96.5  PLT 236 188 186 219   CMP: Recent Labs  Lab 05/16/23 1031 05/17/23 0311 05/18/23 0458 05/19/23 0414 05/20/23 0424 05/21/23 0436  NA 136 136 136 136 133* 132*  K 3.1* 3.9 3.4* 3.3* 3.5 3.3*  CL 107 106 107 105 103 102  CO2 19* 23 21* 20* 21* 24  GLUCOSE 136* 117* 106*  77 131* 154*  BUN 10 14 14 12 9 9   CREATININE 0.51 0.59 0.62 0.53 0.48 0.44  CALCIUM  8.4* 8.4* 8.0* 8.1* 8.3* 8.2*  MG 1.9  --   --  1.8 1.8 2.1  PHOS 3.1  --   --  3.0 2.5 3.1   GFR: Estimated Creatinine Clearance: 56.1 mL/min (by C-G formula based on SCr of 0.44 mg/dL). Recent Labs  Lab 05/16/23 1031 05/17/23 0311 05/20/23 0424 05/21/23 0436  AST 19 13* 12* 11*  ALT 12 11 9 8    ALKPHOS 72 63 51 51  BILITOT 0.9 1.0 0.9 0.6  PROT 6.3* 5.8* 5.6* 5.4*  ALBUMIN  3.5 3.0* 2.7* 2.6*    Recent Labs  Lab 05/16/23 1031  LIPASE 24   No results for input(s): "AMMONIA" in the last 168 hours. Coagulation Profile: No results for input(s): "INR", "PROTIME" in the last 168 hours.  Medications reviewed:  Scheduled Meds:  vitamin C   1,000 mg Oral Daily   bisacodyl   10 mg Rectal Daily   Chlorhexidine  Gluconate Cloth  6 each Topical Daily   enoxaparin  (LOVENOX ) injection  40 mg Subcutaneous Q24H   feeding supplement  1 Container Oral BID BM   insulin  aspart  0-9 Units Subcutaneous Q6H   metoprolol  tartrate  5 mg Intravenous Q12H   pantoprazole  (PROTONIX ) IV  40 mg Intravenous Q24H   polyethylene glycol  34 g Oral BID   rosuvastatin   20 mg Oral QHS   senna  1 tablet Oral BID   thiamine  (VITAMIN B1) injection  100 mg Intravenous Daily   Continuous Infusions:  sodium chloride  40 mL/hr at 05/21/23 1050   sodium chloride      TPN ADULT (ION) 60 mL/hr at 05/20/23 1823   TPN ADULT (ION)      Total time spent in review of labs and imaging, patient evaluation, formulation of plan, documentation and communication with patient/family: 35 minutes  Radiology Studies: No results found.  LOS: 5 days   Lesa Rape, MD [facility] 05/21/2023, 1:47 PM

## 2023-05-21 NOTE — Progress Notes (Signed)
 Gynecologic Oncology Progress Note   Subjective: Patient reports doing well since NG removed. Reports NG tube as bent at the tip with this noted on removal. No nausea or emesis. Throat remains sore, slightly improved. Took her over 12 hours to drink a cup of tea due to throat soreness with swallowing. Had some abdominal cramping after drinking tea. Has had liquid stool from ostomy, small amount of flatus. Denies chest pain, dyspnea. Has been ambulating without difficulty. Right forearm IV has been burning at the site recently.  Objective: Vital signs in last 24 hours: Temp:  [98.2 F (36.8 C)-99.2 F (37.3 C)] 98.4 F (36.9 C) (04/22 0440) Pulse Rate:  [91-103] 93 (04/22 0440) Resp:  [16-18] 18 (04/22 0440) BP: (127-129)/(53-75) 127/63 (04/22 0440) SpO2:  [95 %-96 %] 95 % (04/22 0440) Weight:  [156 lb 8.4 oz (71 kg)] 156 lb 8.4 oz (71 kg) (04/22 0440) Last BM Date : 05/18/23  Intake/Output from previous day: 04/21 0701 - 04/22 0700 In: 1205.1 [P.O.:340; I.V.:865.1] Out: 151 [Stool:151]  Physical Examination: General: alert, cooperative, and no distress Resp: clear to auscultation bilaterally Cardio: regular rate and rhythm, S1, S2 normal, no murmur, click, rub or gallop GI: hypoactive bowel sounds, abdomen mildly tympanic, soft, ostomy with small amount of gas and liquid stool, stoma remains prolapsed/non-edematous/stoma pink and viable Extremities: extremities normal, atraumatic, no cyanosis or edema Right chest port accessed with intact dressing with no surrounding erythema with TPN infusing.  Labs:    Latest Ref Rng & Units 05/18/2023    9:35 AM 05/17/2023   10:20 AM 05/17/2023    3:11 AM  CBC  WBC 4.0 - 10.5 K/uL 4.1  2.9  2.9   Hemoglobin 12.0 - 15.0 g/dL 16.1  09.6  04.5   Hematocrit 36.0 - 46.0 % 38.3  34.1  35.8   Platelets 150 - 400 K/uL 219  186  188       Latest Ref Rng & Units 05/21/2023    4:36 AM 05/20/2023    4:24 AM 05/19/2023    4:14 AM  BMP  Glucose 70 -  99 mg/dL 409  811  77   BUN 8 - 23 mg/dL 9  9  12    Creatinine 0.44 - 1.00 mg/dL 9.14  7.82  9.56   Sodium 135 - 145 mmol/L 132  133  136   Potassium 3.5 - 5.1 mmol/L 3.3  3.5  3.3   Chloride 98 - 111 mmol/L 102  103  105   CO2 22 - 32 mmol/L 24  21  20    Calcium  8.9 - 10.3 mg/dL 8.2  8.3  8.1    03/13/06: KUB FINDINGS: A kinked enteric tube is seen with its distal tip overlying the expected region of the gastroesophageal junction. This is unchanged in appearance when compared to the prior study. Persistent dilated, air distended loops of small bowel are seen within the mid abdomen. Radiopaque contrast is also seen within the ascending and descending colon. No radio-opaque calculi are seen. IMPRESSION: Findings consistent with a partial small bowel obstruction.  Assessment: 78 y.o. admitted with partial small bowel obstruction with transition at the level of prior ileocolic anastomosis. 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 Now s/p 4 cycles of adj C/T (last on 03/25/23). She was currently on Zejula  prior to admission.   Partial SBO: Continued ostomy output. NG  removed yesterday with no nausea or emesis reported. Bowel regimen discussed with patient this am by Gen Surg team. Continue with clears today.   Dysuria: Improved per pt. Urine culture negative.    Peritoneal carcinoma: Hold PARPi until tolerating PO.   Stoma prolapse: Stoma healthy, much less edematous than during recent prior admission.  Patient is scheduled to see Dr. Camilo Cella at the end of the month.   Leukopenia: resolved.   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 Repeat doppler in 10/2022 negative for DVT. Per Dr. Orvil Bland, ok for patient to be on prophylactic lovenox  during hospital stay, can restart Eliquis  on discharge.    LOS: 5 days    Rachel Carlson 05/21/2023, 7:56 AM

## 2023-05-21 NOTE — Progress Notes (Signed)
 Prior-To-Admission Oral Chemotherapy for Treatment of Oncologic Disease   Order noted from Dr. Lesa Rape to continue prior-to-admission oral chemotherapy regimen of niraparib  tosylate (Zejula ) . Oral chemotherapy continuation order is on hold per oncologist "Please hold until SBO resolves. she may start back after discharge" Dr. [name]   Rachel Carlson, Pharm.D Use secure chat for questions 05/21/2023 9:59 AM

## 2023-05-21 NOTE — Progress Notes (Addendum)
 Progress Note     Subjective: NGT removed yesterday afternoon. Patient denies nausea or vomiting overnight. Still having some throat pain but somewhat better after NGT removal. She is having small amount of stool output still.    Objective: Vital signs in last 24 hours: Temp:  [98.2 F (36.8 C)-99.2 F (37.3 C)] 98.4 F (36.9 C) (04/22 0440) Pulse Rate:  [91-103] 93 (04/22 0440) Resp:  [16-18] 18 (04/22 0440) BP: (127-129)/(53-75) 127/63 (04/22 0440) SpO2:  [95 %-96 %] 95 % (04/22 0440) Weight:  [71 kg] 71 kg (04/22 0440) Last BM Date : 05/18/23  Intake/Output from previous day: 04/21 0701 - 04/22 0700 In: 1205.1 [P.O.:340; I.V.:865.1] Out: 151 [Stool:151] Intake/Output this shift: Total I/O In: 240 [P.O.:240] Out: 200 [Stool:200]  PE: General: pleasant, WD, WN female who is laying in bed in NAD Lungs:  Respiratory effort nonlabored Abd: soft, NT, ND, stoma viable with mild prolapse and liquid stool Psych: A&Ox3 with an appropriate affect.    Lab Results:  Recent Labs    05/18/23 0935  WBC 4.1  HGB 12.4  HCT 38.3  PLT 219   BMET Recent Labs    05/20/23 0424 05/21/23 0436  NA 133* 132*  K 3.5 3.3*  CL 103 102  CO2 21* 24  GLUCOSE 131* 154*  BUN 9 9  CREATININE 0.48 0.44  CALCIUM  8.3* 8.2*   PT/INR No results for input(s): "LABPROT", "INR" in the last 72 hours. CMP     Component Value Date/Time   NA 132 (L) 05/21/2023 0436   K 3.3 (L) 05/21/2023 0436   CL 102 05/21/2023 0436   CO2 24 05/21/2023 0436   GLUCOSE 154 (H) 05/21/2023 0436   BUN 9 05/21/2023 0436   CREATININE 0.44 05/21/2023 0436   CREATININE 0.64 04/26/2023 1043   CALCIUM  8.2 (L) 05/21/2023 0436   PROT 5.4 (L) 05/21/2023 0436   ALBUMIN  2.6 (L) 05/21/2023 0436   AST 11 (L) 05/21/2023 0436   AST 11 (L) 04/26/2023 1043   ALT 8 05/21/2023 0436   ALT 7 04/26/2023 1043   ALKPHOS 51 05/21/2023 0436   BILITOT 0.6 05/21/2023 0436   BILITOT 0.7 04/26/2023 1043   GFRNONAA >60  05/21/2023 0436   GFRNONAA >60 04/26/2023 1043   GFRAA 94 06/04/2007 1522   Lipase     Component Value Date/Time   LIPASE 24 05/16/2023 1031       Studies/Results: No results found.   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 Orvil Bland   - CT scan concerning for early or intermittent small bowel obstruction with transition at the level of the prior ileocolic anastomosis - Partial SBO, as patient continues to have ostomy output - passed contrast into colon 4/19  - continue CLD for now since patient not taking much PO yet - added BID 34 g miralax  and BID Senna, stomal suppositories as well   Surgical intervention would be difficult and higher risk due to recent chemotherapy and peritoneal carcinomatosis.     FEN: CLD, TPN ID: None VTE: Please hold Eliquis .  Okay for heparin  drip if clinically indicated   LOS: 5 days   I  reviewed Consultant GYN-ONC notes, hospitalist notes, last 24 h vitals and pain scores, last 48 h intake and output, last 24 h labs and trends, and last 24 h imaging results.  This care required moderate level of medical decision making.    Annetta Killian, [facility] 05/21/2023, 9:09 AM Please see Amion for pager number during day hours 7:00am-4:30pm

## 2023-05-22 ENCOUNTER — Inpatient Hospital Stay (HOSPITAL_COMMUNITY)

## 2023-05-22 DIAGNOSIS — C482 Malignant neoplasm of peritoneum, unspecified: Secondary | ICD-10-CM | POA: Diagnosis not present

## 2023-05-22 DIAGNOSIS — K56600 Partial intestinal obstruction, unspecified as to cause: Secondary | ICD-10-CM | POA: Diagnosis not present

## 2023-05-22 DIAGNOSIS — R112 Nausea with vomiting, unspecified: Secondary | ICD-10-CM | POA: Diagnosis not present

## 2023-05-22 LAB — COMPREHENSIVE METABOLIC PANEL WITH GFR
ALT: 9 U/L (ref 0–44)
AST: 12 U/L — ABNORMAL LOW (ref 15–41)
Albumin: 2.8 g/dL — ABNORMAL LOW (ref 3.5–5.0)
Alkaline Phosphatase: 56 U/L (ref 38–126)
Anion gap: 8 (ref 5–15)
BUN: 10 mg/dL (ref 8–23)
CO2: 25 mmol/L (ref 22–32)
Calcium: 9 mg/dL (ref 8.9–10.3)
Chloride: 104 mmol/L (ref 98–111)
Creatinine, Ser: 0.47 mg/dL (ref 0.44–1.00)
GFR, Estimated: >60 mL/min (ref >60)
Glucose, Bld: 148 mg/dL — ABNORMAL HIGH (ref 70–99)
Potassium: 4.3 mmol/L (ref 3.5–5.1)
Sodium: 137 mmol/L (ref 135–145)
Total Bilirubin: 0.5 mg/dL (ref 0.0–1.2)
Total Protein: 5.9 g/dL — ABNORMAL LOW (ref 6.5–8.1)

## 2023-05-22 LAB — GLUCOSE, CAPILLARY
Glucose-Capillary: 142 mg/dL — ABNORMAL HIGH (ref 70–99)
Glucose-Capillary: 150 mg/dL — ABNORMAL HIGH (ref 70–99)
Glucose-Capillary: 153 mg/dL — ABNORMAL HIGH (ref 70–99)
Glucose-Capillary: 207 mg/dL — ABNORMAL HIGH (ref 70–99)

## 2023-05-22 LAB — PHOSPHORUS: Phosphorus: 3.8 mg/dL (ref 2.5–4.6)

## 2023-05-22 LAB — MAGNESIUM: Magnesium: 2 mg/dL (ref 1.7–2.4)

## 2023-05-22 MED ORDER — DEXAMETHASONE SODIUM PHOSPHATE 10 MG/ML IJ SOLN
6.0000 mg | INTRAMUSCULAR | Status: DC
  Administered 2023-05-22 – 2023-05-24 (×3): 6 mg via INTRAVENOUS
  Filled 2023-05-22: qty 0.6
  Filled 2023-05-22: qty 1
  Filled 2023-05-22: qty 0.6
  Filled 2023-05-22: qty 1
  Filled 2023-05-22 (×2): qty 0.6

## 2023-05-22 MED ORDER — TRAVASOL 10 % IV SOLN
INTRAVENOUS | Status: AC
  Filled 2023-05-22: qty 998.4

## 2023-05-22 MED ORDER — PANTOPRAZOLE SODIUM 40 MG PO TBEC
40.0000 mg | DELAYED_RELEASE_TABLET | Freq: Every day | ORAL | Status: DC
  Administered 2023-05-23 – 2023-06-01 (×9): 40 mg via ORAL
  Filled 2023-05-22 (×9): qty 1

## 2023-05-22 NOTE — Progress Notes (Signed)
 PHARMACY - TOTAL PARENTERAL NUTRITION CONSULT NOTE   Indication: Prolonged ileus  Patient Measurements: Height: 5\' 2"  (157.5 cm) Weight: 71 kg (156 lb 8.4 oz) IBW/kg (Calculated) : 50.1 TPN AdjBW (KG): 55.7 Body mass index is 28.63 kg/m. Usual Weight:   Assessment: Patient is a 79 y.o F with hx peritoneal carcinomatosis, IDS (11/2022 - exploratory laparotomy with radical tumor debulking including total omentectomy, resection of peritoneal disease, small bowel resection with 3 anastomoses, en bloc resection of the left fallopian tube and ovary as well as rectosigmoid colon, left ureterolysis, right salpingo-oophorectomy, end descending colostomy) who presented to the ED on 05/16/23 with c/o n/v. Abdominal CT on 05/16/23 showed findings with concern for SBO and abd xray on 4/19 showed partial SBO. Pharmacy has been consulted on 05/19/23 to start TPN for prolonged ileus.  Glucose / Insulin : no hx DM; - cbgs (goal <150) 129 - 157 5 units SSI/24 hrs Electrolytes: all WNL - Goal for ileus: Mag >2, K >4 Renal: Scr/BUN WNL Hepatic: albumin  low, ALT, alk phos, and Tbili WNL Intake / Output; MIVF: NS @ 42ml/hr - NGT removed 4/21  - UOP: 2x - Stool output:  300 mls (colostomy) CLD intake: 480 mls GI Imaging: - 4/17 abd CT: Findings concerning for early or intermittent small bowel obstruction with transition at the level of prior ileocolic anastomosis. Bilateral pleural effusions  - 4/19 abd xray: Findings consistent with a partial small bowel obstruction  4/23 abd xray:  GI Surgeries / Procedures:   Central access: Port-a- cath  TPN start date: 05/19/23  Nutritional Goals: Goal TPN rate is 80 mL/hr (provides 99.8 g of protein and 1896 kcals per day)  RD Assessment:  Estimated Needs Total Energy Estimated Needs: 1750-2000 Total Protein Estimated Needs: 90-115 Total Fluid Estimated Needs: >1.75 L/day or per MD  Current Nutrition:  TPN, CLD (starting 4/21) 4/22 pt reports poor PO intake  d/t sore throat 4/23 50% of 2 CLD meals recorded, less throat pain  Plan:  At 1800:  Continue TPN @ goal rate of 80 mL/hr (provides ~ 100 g protein and 1896 kcals per day) - Electrolytes in TPN:  Na 70mEq/L K 50mEq/L Ca 5mEq/L Mg 5mEq/L Phos 15mmol/L Cl:Ac 1:1 - Add standard MVI and trace elements to TPN  -Thiamine  100 mg IV daily x5 days (4/20 - 4/24) DC SSI & DC CBGs - MIVF @ 20 mL/hr or per MD - At minimum, monitor TPN labs on Mon/Thurs.      - f/u tolerance to CLD & ability to wean TPN  Rubie Corona, Pharm.D Use secure chat for questions 05/22/2023 8:56 AM

## 2023-05-22 NOTE — Plan of Care (Signed)

## 2023-05-22 NOTE — Plan of Care (Signed)

## 2023-05-22 NOTE — Progress Notes (Signed)
 Gynecologic Oncology Progress Note   Subjective: Patient reports having episode of emesis x2 last pm. No nausea or emesis this am. Output from ostomy yesterday was 450 cc and she has had 100 cc out today. Stool is liquid, small amount of flatus. Voiding without difficulty. Denies chest pain, dyspnea. Had been sipping on tea. Throat soreness has improved.   Objective: Vital signs in last 24 hours: Temp:  [98.3 F (36.8 C)-99.4 F (37.4 C)] 98.3 F (36.8 C) (04/23 0507) Pulse Rate:  [81-94] 91 (04/23 0507) Resp:  [15-18] 18 (04/23 0507) BP: (118-147)/(70-80) 127/70 (04/23 0507) SpO2:  [95 %-97 %] 95 % (04/23 0507) Last BM Date : 05/22/23  Intake/Output from previous day: 04/22 0701 - 04/23 0700 In: 2124.1 [P.O.:480; I.V.:1475.3; IV Piggyback:168.8] Out: 300 [Stool:300]  Physical Examination: General: alert, cooperative, and no distress Resp: clear to auscultation bilaterally Cardio: regular rate and rhythm, S1, S2 normal, no murmur, click, rub or gallop GI: hypoactive bowel sounds, abdomen tympanic, soft, more distended today compared with yesterday's assessment, ostomy with small amount of gas and liquid stool, stoma remains mildly prolapsed/non-edematous/stoma pink and viable Extremities: extremities normal, atraumatic, no cyanosis or edema Right chest port accessed with intact dressing with no surrounding erythema with TPN infusing. Left facial bruising from recent dermatology procedure is improving  Labs:    Latest Ref Rng & Units 05/18/2023    9:35 AM 05/17/2023   10:20 AM 05/17/2023    3:11 AM  CBC  WBC 4.0 - 10.5 K/uL 4.1  2.9  2.9   Hemoglobin 12.0 - 15.0 g/dL 81.1  91.4  78.2   Hematocrit 36.0 - 46.0 % 38.3  34.1  35.8   Platelets 150 - 400 K/uL 219  186  188       Latest Ref Rng & Units 05/22/2023    4:51 AM 05/21/2023    4:36 AM 05/20/2023    4:24 AM  BMP  Glucose 70 - 99 mg/dL 956  213  086   BUN 8 - 23 mg/dL 10  9  9    Creatinine 0.44 - 1.00 mg/dL 5.78  4.69   6.29   Sodium 135 - 145 mmol/L 137  132  133   Potassium 3.5 - 5.1 mmol/L 4.3  3.3  3.5   Chloride 98 - 111 mmol/L 104  102  103   CO2 22 - 32 mmol/L 25  24  21    Calcium  8.9 - 10.3 mg/dL 9.0  8.2  8.3    06/26/39: KUB FINDINGS: A kinked enteric tube is seen with its distal tip overlying the expected region of the gastroesophageal junction. This is unchanged in appearance when compared to the prior study. Persistent dilated, air distended loops of small bowel are seen within the mid abdomen. Radiopaque contrast is also seen within the ascending and descending colon. No radio-opaque calculi are seen. IMPRESSION: Findings consistent with a partial small bowel obstruction.  05/22/2023: KUB: Persistent small bowel dilatation.   Assessment: 78 y.o. admitted with partial small bowel obstruction with transition at the level of prior ileocolic anastomosis. 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 Now s/p 4 cycles of adj C/T (last on 03/25/23). She was currently on Zejula  prior to admission.   Partial SBO: Continued ostomy output. NG removed 4/21. Episode of emesis last pm, increased abdominal distension-xray performed this am with persistent SB dilation.  Dysuria: Improved per pt. Urine culture negative.    Peritoneal carcinoma: Hold PARPi until tolerating PO.   Stoma prolapse: Stoma healthy, much less edematous than during recent prior admission.  Patient is scheduled to see Dr. Camilo Cella at the end of the month.   Leukopenia: resolved. 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 Repeat doppler in 10/2022 negative for DVT. Per Dr. Orvil Bland, ok for patient to be on prophylactic lovenox  during hospital stay, can restart Eliquis  on discharge.  Continue with current plan of care. Per Dr.  Orvil Bland, it would be reasonable to start dexamethasone . In the setting of known malignancy, this may be be a malignant partial small bowel obstruction (caused by small volume/peritoneal disease).    LOS: 6 days    Rachel Carlson 05/22/2023, 11:30 AM

## 2023-05-22 NOTE — Progress Notes (Signed)
 PROGRESS NOTE Rachel Carlson  YQM:[id number] DOB: Jan 05, 1946 DOA: 05/16/2023 PCP: Clemens Curt, MD  Brief Narrative/Hospital Course: 78 year old female with ovarian cancer and carcinomatosis s/p ex lap with tumor debulking, total omentectomy, SBR with 3 anastomosis, resection of ovary and rectosigmoid., DVT, GERD admitted last night for possible partial small bowel obstruction with probable transition point at the level of colonic anastomosis. 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 Per CCS patient with difficult and higher risks for surgical intervention   Hypokalemia Hyponatremia: Resolved.   Painful swallowing Throat pain: Likely in the setting of NG tube insertion/trauma-reports she is feeling much better after NG removal improving every day.  Continue supportive care.  Neck exam grossly normal   Hypertension: BP stable. Monitor and cont prn. PTA not taking metoprolol -will dc.  DVT prophylaxis: enoxaparin  (LOVENOX ) injection 40 mg Start: 05/16/23 2200 Code Status:   Code Status: Full Code Family Communication: plan of care discussed with patient at bedside. Patient status is:  Remains hospitalized because of severity of illness Level of care: Med-Surg   Dispo: The patient is from: HOME            Anticipated disposition: TBD Objective: Vitals last 24 hrs: Vitals:   05/21/23 0440 05/21/23 1226 05/21/23 1950 05/22/23 0507  BP: 127/63 118/80 (!) 147/76 127/70  Pulse: 93 94 81 91  Resp: 18 15 18 18   Temp: 98.4 F (36.9 C) 98.4 F (36.9 C) 99.4 F (37.4 C) 98.3 F (36.8 C)  TempSrc: Oral Oral    SpO2: 95% 97% 96% 95%  Weight: 71 kg     Height:        Physical Examination: General exam: alert awake, older than stated age HEENT:Oral mucosa moist, Ear/Nose WNL grossly Respiratory system: Bilaterally diminished BS, no use of accessory muscle Cardiovascular system: S1 & S2 +. Gastrointestinal system: Abdomen soft, NT,ND,BS+, colostomy with liquidy stool output Nervous System: Alert, awake,following commands. Extremities: LE edema neg, moving arms LEG, warm legs Skin: No rashes,warm. MSK: Normal muscle bulk/tone.   Data Reviewed: I have personally reviewed following labs and imaging studies ( see epic result tab) CBC: Recent Labs  Lab 05/16/23 1031 05/17/23 0311 05/17/23 1020 05/18/23 0935  WBC 8.6 2.9* 2.9* 4.1  NEUTROABS 7.3  --  1.5* 2.3  HGB 13.4 11.3* 11.1* 12.4  HCT 40.4 35.8* 34.1* 38.3  MCV 94.4 98.1 97.4 96.5  PLT 236 188 186 219   CMP: Recent Labs  Lab 05/16/23 1031 05/17/23 0311 05/18/23 0458 05/19/23 0414 05/20/23 0424 05/21/23 0436 05/22/23 0451  NA 136   < > 136 136 133* 132* 137  K 3.1*   < > 3.4* 3.3* 3.5 3.3* 4.3  CL 107   < > 107 105 103 102 104  CO2 19*   < > 21* 20* 21* 24 25  GLUCOSE 136*   < > 106* 77 131* 154* 148*  BUN 10   < > 14 12 9 9 10   CREATININE 0.51   < > 0.62 0.53 0.48 0.44 0.47  CALCIUM  8.4*   < > 8.0* 8.1* 8.3* 8.2* 9.0  MG 1.9  --   --  1.8 1.8 2.1 2.0  PHOS 3.1  --   --  3.0 2.5 3.1 3.8   < > = values in this interval not displayed.   GFR: Estimated Creatinine Clearance: 56.1 mL/min (by C-G  formula based on SCr of 0.47 mg/dL). Recent Labs  Lab 05/16/23 1031 05/17/23 0311 05/20/23 0424 05/21/23 0436 05/22/23 0451  AST 19 13* 12* 11* 12*  ALT 12 11 9 8 9   ALKPHOS 72 63 51 51 56  BILITOT 0.9 1.0 0.9 0.6 0.5  PROT 6.3* 5.8* 5.6* 5.4* 5.9*  ALBUMIN  3.5 3.0* 2.7* 2.6* 2.8*    Recent Labs  Lab 05/16/23 1031  LIPASE 24   No results for input(s): "AMMONIA" in the last 168 hours. Coagulation Profile: No results for input(s): "INR", "PROTIME" in the last 168 hours.  Medications reviewed:  Scheduled Meds:  vitamin C   1,000 mg Oral Daily   bisacodyl   10 mg Rectal Daily   Chlorhexidine  Gluconate Cloth  6 each Topical Daily   enoxaparin  (LOVENOX ) injection  40 mg Subcutaneous Q24H   feeding supplement  1 Container Oral BID BM   metoprolol  tartrate  5 mg Intravenous Q12H   [START ON 05/23/2023] pantoprazole   40 mg Oral Daily   polyethylene glycol  34 g Oral BID   rosuvastatin   20 mg Oral QHS   senna  1 tablet Oral BID   thiamine  (VITAMIN B1) injection  100 mg Intravenous Daily   Continuous Infusions:  sodium chloride  20 mL/hr at 05/21/23 2214   TPN ADULT (ION) 80 mL/hr at 05/21/23 1715   TPN ADULT (ION)      Total time spent in review of labs and imaging, patient evaluation, formulation of plan, documentation and communication with patient/family: 35 minutes  Radiology Studies: DG Abd Portable 1V Result Date: 05/22/2023 CLINICAL DATA:  Follow up small bowel obstruction EXAM: PORTABLE ABDOMEN - 1 VIEW COMPARISON:  05/18/2023 FINDINGS: Previously seen colonic contrast has cleared. Persistent small bowel dilatation is noted consistent with the given clinical history of partial small bowel obstruction. No free air is noted. No bony abnormality is seen. IMPRESSION: Persistent small bowel dilatation. Electronically Signed   By: Violeta Grey M.D.   On: 05/22/2023 10:54    LOS: 6 days   Lesa Rape, MD [facility] 05/22/2023, 11:38 AM

## 2023-05-22 NOTE — Progress Notes (Signed)
 Mobility Specialist - Progress Note   05/22/23 1219  Mobility  Activity Ambulated independently in hallway  Level of Assistance Independent  Assistive Device None  Distance Ambulated (ft) 1050 ft  Activity Response Tolerated well  Mobility Referral Yes  Mobility visit 1 Mobility  Mobility Specialist Start Time (ACUTE ONLY) 1200  Mobility Specialist Stop Time (ACUTE ONLY) 1219  Mobility Specialist Time Calculation (min) (ACUTE ONLY) 19 min   Pt received in bed and agreeable to mobility. No complaints during session. Pt to bed after session with all needs met.    [facility]

## 2023-05-22 NOTE — Progress Notes (Addendum)
 Progress Note     Subjective: Having ostomy output and taking CLD. She did vomit small amount once overnight, but no further n/v since. She is ambulating. Throat pain is improving since NGT removal   Objective: Vital signs in last 24 hours: Temp:  [98.3 F (36.8 C)-99.4 F (37.4 C)] 98.3 F (36.8 C) (04/23 0507) Pulse Rate:  [81-94] 91 (04/23 0507) Resp:  [15-18] 18 (04/23 0507) BP: (118-147)/(70-80) 127/70 (04/23 0507) SpO2:  [95 %-97 %] 95 % (04/23 0507) Last BM Date : 05/22/23  Intake/Output from previous day: 04/22 0701 - 04/23 0700 In: 2124.1 [P.O.:480; I.V.:1475.3; IV Piggyback:168.8] Out: 300 [Stool:300] Intake/Output this shift: No intake/output data recorded.  PE: General: pleasant, WD, WN female who is laying in bed in NAD Lungs:  Respiratory effort nonlabored Abd: soft, NT, ND, stoma viable with mild prolapse and liquid stool Psych: A&Ox3 with an appropriate affect.    Lab Results:  No results for input(s): "WBC", "HGB", "HCT", "PLT" in the last 72 hours.  BMET Recent Labs    05/21/23 0436 05/22/23 0451  NA 132* 137  K 3.3* 4.3  CL 102 104  CO2 24 25  GLUCOSE 154* 148*  BUN 9 10  CREATININE 0.44 0.47  CALCIUM  8.2* 9.0   PT/INR No results for input(s): "LABPROT", "INR" in the last 72 hours. CMP     Component Value Date/Time   NA 137 05/22/2023 0451   K 4.3 05/22/2023 0451   CL 104 05/22/2023 0451   CO2 25 05/22/2023 0451   GLUCOSE 148 (H) 05/22/2023 0451   BUN 10 05/22/2023 0451   CREATININE 0.47 05/22/2023 0451   CREATININE 0.64 04/26/2023 1043   CALCIUM  9.0 05/22/2023 0451   PROT 5.9 (L) 05/22/2023 0451   ALBUMIN  2.8 (L) 05/22/2023 0451   AST 12 (L) 05/22/2023 0451   AST 11 (L) 04/26/2023 1043   ALT 9 05/22/2023 0451   ALT 7 04/26/2023 1043   ALKPHOS 56 05/22/2023 0451   BILITOT 0.5 05/22/2023 0451   BILITOT 0.7 04/26/2023 1043   GFRNONAA >60 05/22/2023 0451   GFRNONAA >60 04/26/2023 1043   GFRAA 94 06/04/2007 1522   Lipase      Component Value Date/Time   LIPASE 24 05/16/2023 1031       Studies/Results: No results found.   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 Orvil Bland   - CT scan concerning for early or intermittent small bowel obstruction with transition at the level of the prior ileocolic anastomosis - Partial SBO, as patient continues to have ostomy output - passed contrast into colon 4/19  - KUB this AM, possible advancement of diet pending film  - added BID 34 g miralax  and BID Senna, stomal suppositories as well   Surgical intervention would be difficult and higher risk due to recent chemotherapy and peritoneal carcinomatosis.     FEN: CLD, TPN ID: None VTE: Please hold Eliquis .  Okay for heparin  drip if clinically indicated   LOS: 6 days   I reviewed Consultant GYN-ONC notes, hospitalist notes, last 24 h vitals and pain scores, last 48 h intake and output, last 24 h labs and  trends, and last 24 h imaging results.  This care required moderate level of medical decision making.    Annetta Killian, [facility] 05/22/2023, 9:14 AM Please see Amion for pager number during day hours 7:00am-4:30pm

## 2023-05-23 ENCOUNTER — Inpatient Hospital Stay (HOSPITAL_COMMUNITY)

## 2023-05-23 DIAGNOSIS — K56600 Partial intestinal obstruction, unspecified as to cause: Secondary | ICD-10-CM | POA: Diagnosis not present

## 2023-05-23 LAB — COMPREHENSIVE METABOLIC PANEL WITH GFR
ALT: 10 U/L (ref 0–44)
AST: 15 U/L (ref 15–41)
Albumin: 3.1 g/dL — ABNORMAL LOW (ref 3.5–5.0)
Alkaline Phosphatase: 61 U/L (ref 38–126)
Anion gap: 9 (ref 5–15)
BUN: 12 mg/dL (ref 8–23)
CO2: 23 mmol/L (ref 22–32)
Calcium: 9.4 mg/dL (ref 8.9–10.3)
Chloride: 103 mmol/L (ref 98–111)
Creatinine, Ser: 0.5 mg/dL (ref 0.44–1.00)
GFR, Estimated: >60 mL/min (ref >60)
Glucose, Bld: 193 mg/dL — ABNORMAL HIGH (ref 70–99)
Potassium: 4.4 mmol/L (ref 3.5–5.1)
Sodium: 135 mmol/L (ref 135–145)
Total Bilirubin: 0.7 mg/dL (ref 0.0–1.2)
Total Protein: 6.4 g/dL — ABNORMAL LOW (ref 6.5–8.1)

## 2023-05-23 LAB — GLUCOSE, CAPILLARY
Glucose-Capillary: 169 mg/dL — ABNORMAL HIGH (ref 70–99)
Glucose-Capillary: 174 mg/dL — ABNORMAL HIGH (ref 70–99)
Glucose-Capillary: 175 mg/dL — ABNORMAL HIGH (ref 70–99)
Glucose-Capillary: 185 mg/dL — ABNORMAL HIGH (ref 70–99)

## 2023-05-23 LAB — MAGNESIUM: Magnesium: 2 mg/dL (ref 1.7–2.4)

## 2023-05-23 LAB — PHOSPHORUS: Phosphorus: 3.5 mg/dL (ref 2.5–4.6)

## 2023-05-23 MED ORDER — TRAVASOL 10 % IV SOLN
INTRAVENOUS | Status: AC
  Filled 2023-05-23: qty 998.4

## 2023-05-23 MED ORDER — ENSURE ENLIVE PO LIQD
237.0000 mL | Freq: Two times a day (BID) | ORAL | Status: DC
  Administered 2023-05-23 – 2023-05-24 (×3): 237 mL via ORAL

## 2023-05-23 MED ORDER — INSULIN ASPART 100 UNIT/ML IJ SOLN
0.0000 [IU] | Freq: Four times a day (QID) | INTRAMUSCULAR | Status: DC
  Administered 2023-05-23 – 2023-05-24 (×5): 2 [IU] via SUBCUTANEOUS
  Administered 2023-05-24: 1 [IU] via SUBCUTANEOUS
  Administered 2023-05-25: 2 [IU] via SUBCUTANEOUS
  Administered 2023-05-25 – 2023-05-26 (×2): 1 [IU] via SUBCUTANEOUS
  Administered 2023-05-26 (×2): 2 [IU] via SUBCUTANEOUS
  Administered 2023-05-27: 7 [IU] via SUBCUTANEOUS
  Administered 2023-05-27: 3 [IU] via SUBCUTANEOUS
  Administered 2023-05-27 (×2): 2 [IU] via SUBCUTANEOUS
  Administered 2023-05-27: 3 [IU] via SUBCUTANEOUS
  Administered 2023-05-28: 2 [IU] via SUBCUTANEOUS

## 2023-05-23 NOTE — Plan of Care (Signed)

## 2023-05-23 NOTE — Progress Notes (Signed)
 Mobility Specialist - Progress Note   05/23/23 1517  Mobility  Activity Ambulated independently in hallway  Level of Assistance Independent  Assistive Device None  Distance Ambulated (ft) 1400 ft  Activity Response Tolerated well  Mobility Referral Yes  Mobility visit 1 Mobility  Mobility Specialist Start Time (ACUTE ONLY) 1448  Mobility Specialist Stop Time (ACUTE ONLY) 1513  Mobility Specialist Time Calculation (min) (ACUTE ONLY) 25 min   Pt received in bed and agreeable to mobility. No complaints during session. Pt to bed after session with all needs met.    [facility]

## 2023-05-23 NOTE — Progress Notes (Signed)
 PHARMACY - TOTAL PARENTERAL NUTRITION CONSULT NOTE   Indication: Prolonged ileus  Patient Measurements: Height: 5\' 2"  (157.5 cm) Weight: 71 kg (156 lb 8.4 oz) IBW/kg (Calculated) : 50.1 TPN AdjBW (KG): 55.7 Body mass index is 28.63 kg/m. 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 Abdominal CT on 05/16/23 showed findings with concern for SBO and abd xray on 4/19 showed partial SBO. Pharmacy has been consulted on 05/19/23 to start TPN for prolonged ileus.  Glucose / Insulin : no hx DM; - cbgs (goal <150) 150 - 207 - No SSI in last 24h (SSI and CBG checks discontinued yesterday as prior CBGs were within goal range for several days). Daily Decadron  started 4/23 is likely causing elevation in CBGs. Electrolytes: all WNL, including CoCa at 9.8 - Goal for ileus: Mag >2, K >4 Renal: Scr/BUN WNL Hepatic: albumin  low but improved; LFTs, alk phos, and Tbili WNL Intake / Output; MIVF: - NGT removed 4/21  - UOP: 1x - Stool output:  100 mls (colostomy) - CLD intake: 240 mls GI Imaging: - 4/17 abd CT: Findings concerning for early or intermittent small bowel obstruction with transition at the level of prior ileocolic anastomosis. Bilateral pleural effusions  - 4/19 abd xray: Findings consistent with a partial small bowel obstruction  4/23 abd xray: persistent small bowel dilation GI Surgeries / Procedures:   Central access: Port-a- cath  TPN start date: 05/19/23  Nutritional Goals: Goal TPN rate is 80 mL/hr (provides 99.8 g of protein and 1896 kcals per day)  RD Assessment:   Estimated Needs Total Energy Estimated Needs: 1750-2000 Total Protein Estimated Needs: 90-115 Total Fluid Estimated Needs: >1.75 L/day or per MD  Current Nutrition:  TPN, CLD (starting 4/21) 4/22 pt reports poor PO intake d/t sore throat 4/23 50% of 2 CLD meals recorded, less throat pain 4/24 repeat KUB and, if reassuring, will advance to FLD  Plan:  At 1800:  Continue TPN @ goal rate of 80 mL/hr (provides ~ 100 g protein and 1896 kcals per day) - Electrolytes in TPN:  Na 70mEq/L K 50mEq/L Ca 5mEq/L Mg 27mEq/L Phos 15mmol/L Cl:Ac 1:1 - Add standard MVI and trace elements to TPN -Thiamine  100 mg IV daily x5 days (4/20 - 4/24) - Restart sSSI q6h given increase in CBGs over last 24hrs - At minimum, monitor TPN labs on Mon/Thurs.      - f/u tolerance of FLD (if pt advances to FLD today) & ability to wean TPN   Roselee Cong, PharmD Clinical Pharmacist  4/24/20259:43 AM

## 2023-05-23 NOTE — Progress Notes (Signed)
 Gynecologic Oncology Progress Note   Subjective: Patient reports having an "uneventful" night. No nausea or emesis this am. Voiding without difficulty. Overactive bladder causing freq urination, more at night. Reports little output from the ostomy overnight. Abdominal cramping is intermittent and more when drinking liquids. No chest pain, dyspnea. Throat soreness is improving. Asking about recently discontinued sliding scale. Would like additional ostomy supplies at the bedside if needed. Ambulating in the halls without difficulty.  Objective: Vital signs in last 24 hours: Temp:  [98.1 F (36.7 C)-98.7 F (37.1 C)] 98.1 F (36.7 C) (04/24 0441) Pulse Rate:  [86-100] 100 (04/24 0441) Resp:  [18-19] 18 (04/24 0441) BP: (131-139)/(70-78) 139/78 (04/24 0441) SpO2:  [95 %-99 %] 99 % (04/24 0441) Last BM Date : 05/22/23  Intake/Output from previous day: 04/23 0701 - 04/24 0700 In: 3297.4 [P.O.:240; I.V.:3057.4] Out: 100 [Stool:100]  Physical Examination: General: alert, cooperative, and no distress Resp: clear to auscultation bilaterally Cardio: regular rate and rhythm, S1, S2 normal, no murmur, click, rub or gallop GI: hypoactive bowel sounds, abdomen tympanic, soft, distended, ostomy with small amount of gas and liquid stool, stoma remains mildly prolapsed/non-edematous/stoma pink and viable Extremities: extremities normal, atraumatic, no cyanosis or edema Right chest port accessed with intact dressing with no surrounding erythema with TPN infusing. Left facial bruising from recent dermatology procedure is improving  Labs:    Latest Ref Rng & Units 05/18/2023    9:35 AM 05/17/2023   10:20 AM 05/17/2023    3:11 AM  CBC  WBC 4.0 - 10.5 K/uL 4.1  2.9  2.9   Hemoglobin 12.0 - 15.0 g/dL 24.2  35.3  61.4   Hematocrit 36.0 - 46.0 % 38.3  34.1  35.8   Platelets 150 - 400 K/uL 219  186  188       Latest Ref Rng & Units 05/23/2023    4:38 AM 05/22/2023    4:51 AM 05/21/2023    4:36 AM   BMP  Glucose 70 - 99 mg/dL 431  540  086   BUN 8 - 23 mg/dL 12  10  9    Creatinine 0.44 - 1.00 mg/dL 7.61  9.50  9.32   Sodium 135 - 145 mmol/L 135  137  132   Potassium 3.5 - 5.1 mmol/L 4.4  4.3  3.3   Chloride 98 - 111 mmol/L 103  104  102   CO2 22 - 32 mmol/L 23  25  24    Calcium  8.9 - 10.3 mg/dL 9.4  9.0  8.2    6/71/24: KUB FINDINGS: A kinked enteric tube is seen with its distal tip overlying the expected region of the gastroesophageal junction. This is unchanged in appearance when compared to the prior study. Persistent dilated, air distended loops of small bowel are seen within the mid abdomen. Radiopaque contrast is also seen within the ascending and descending colon. No radio-opaque calculi are seen. IMPRESSION: Findings consistent with a partial small bowel obstruction.  05/22/2023: KUB: Persistent small bowel dilatation.   05/23/2023: KUB: Persistent small bowel dilatation stable from the previous day.   Assessment: 78 y.o. admitted with partial small bowel obstruction with transition at the level of prior ileocolic anastomosis. 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 Now s/p 4 cycles of adj C/T (last on 03/25/23). She was currently on Zejula  prior to admission.   Partial SBO: Slowed ostomy  output per pt. NG removed 4/21. Episode of emesis 4/22 pm. Xray performed this am with persistent SB dilation (stable from yesterday). Dexamethasone  started last pm.  Dysuria: Improved per pt. Urine culture negative.    Peritoneal carcinoma: Hold PARPi until tolerating PO.   Stoma prolapse: Stoma healthy, much less edematous than during recent prior admission.  Patient is scheduled to see Dr. Camilo Cella at the end of the month.   Leukopenia: resolved. 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 Repeat doppler in 10/2022 negative for DVT. Per Dr. Orvil Bland, ok for patient to be on prophylactic lovenox  during hospital stay, can restart Eliquis  on discharge.  Continue with current plan of care. Dexamethasone  started 05/22/23 in pm. In the setting of known malignancy, this may be a malignant partial small bowel obstruction (caused by small volume/peritoneal disease).   Pharmacy to re-order sliding scale given TPN along with addition of dexamethasone . RN to order ostomy supplies to the room.   LOS: 7 days    Suellyn Emory 05/23/2023, 9:37 AM

## 2023-05-23 NOTE — Progress Notes (Signed)
 PROGRESS NOTE Rachel Carlson  GEX:[id number] DOB: 01-Jan-1946 DOA: 05/16/2023 PCP: Clemens Curt, MD  Brief Narrative/Hospital Course: 78 year old female with ovarian cancer and carcinomatosis s/p ex lap with tumor debulking, total omentectomy, SBR with 3 anastomosis, resection of ovary and rectosigmoid., DVT, GERD admitted last night for possible partial small bowel obstruction with probable transition point at the level of colonic anastomosis. 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 Diet per [facility]CS Continue current MiraLAX  bid senna bid and stomal suppository Continue holding Eliquis  for now until cleared by [facility]. Per [facility] patient with difficult and higher risks for surgical intervention Per GYN oncology Decadron  has been started 4/23 as suspecting it is malignant partial small bowel obstruction due to small volume and peritoneal disease.    Hypokalemia Hyponatremia: Resolved.   Painful swallowing Throat pain: Likely in the setting of NG tube  insertion/trauma-reports she is feeling much better after NG removal improving every day.  Continue supportive care.  Neck exam grossly normal   Hypertension: BP stable. Monitor and cont prn. PTA not taking metoprolol -will dc.  DVT prophylaxis: enoxaparin  (LOVENOX ) injection 40 mg Start: 05/16/23 2200 Code Status:   Code Status: Full Code Family Communication: plan of care discussed with patient at bedside. Patient status is: Remains hospitalized because of severity of illness Level of care: Med-Surg   Dispo: The patient is from: HOME            Anticipated disposition: TBD Objective: Vitals last 24 hrs: Vitals:   05/22/23 0507 05/22/23 1347 05/22/23 1953 05/23/23 0441  BP: 127/70 131/70 133/77 139/78  Pulse: 91 86 89 100  Resp: 18 19 18 18   Temp: 98.3 F (36.8 C) 98.7 F (37.1 C) 98.5 F (36.9 C) 98.1 F (36.7 C)  TempSrc:  Oral Oral Oral  SpO2: 95% 97% 95% 99%  Weight:      Height:        Physical Examination: General exam: alert awake, older than stated age HEENT:Oral mucosa moist, Ear/Nose WNL grossly Respiratory system: Bilaterally diminished BS, no use of accessory muscle Cardiovascular system: S1 & S2 +. Gastrointestinal system: Abdomen soft, NT,ND,BS+, colostomy with liquidy AND GAS output Nervous System: Alert, awake,following commands. Extremities: LE edema neg, moving arms LEG, warm legs Skin: No rashes,warm. MSK: Normal muscle bulk/tone.   Data Reviewed: I have personally reviewed following labs and imaging studies ( see epic result tab) CBC: Recent Labs  Lab 05/17/23 0311 05/17/23 1020 05/18/23 0935  WBC 2.9* 2.9* 4.1  NEUTROABS  --  1.5* 2.3  HGB 11.3* 11.1* 12.4  HCT 35.8* 34.1* 38.3  MCV 98.1 97.4 96.5  PLT 188 186 219   CMP: Recent Labs  Lab 05/19/23 0414 05/20/23 0424 05/21/23 0436 05/22/23 0451 05/23/23 0438  NA 136 133* 132* 137 135  K 3.3* 3.5 3.3* 4.3 4.4  CL 105 103 102 104 103  CO2 20* 21* 24 25 23   GLUCOSE 77 131* 154* 148*  193*  BUN 12 9 9 10 12   CREATININE 0.53 0.48 0.44 0.47 0.50  CALCIUM  8.1* 8.3* 8.2* 9.0 9.4  MG 1.8 1.8 2.1 2.0 2.0  PHOS 3.0 2.5 3.1 3.8 3.5   GFR: Estimated Creatinine Clearance: 56.1 mL/min (by C-G formula based on SCr of 0.5 mg/dL). Recent Labs  Lab 05/17/23 0311 05/20/23 0424 05/21/23 0436 05/22/23 0451 05/23/23 0438  AST 13* 12* 11* 12* 15  ALT 11 9 8 9 10   ALKPHOS 63 51 51 56 61  BILITOT 1.0 0.9 0.6 0.5 0.7  PROT 5.8* 5.6* 5.4* 5.9* 6.4*  ALBUMIN  3.0* 2.7* 2.6* 2.8* 3.1*    No results for input(s): "LIPASE", "AMYLASE" in the last 168 hours.  No results for input(s): "AMMONIA" in the last 168 hours. Coagulation Profile: No results for input(s): "INR", "PROTIME" in the last 168 hours.  Medications reviewed:  Scheduled Meds:  vitamin C   1,000 mg Oral Daily   Chlorhexidine  Gluconate Cloth  6 each Topical Daily   dexamethasone   6 mg Intravenous Q24H   enoxaparin  (LOVENOX ) injection  40 mg Subcutaneous Q24H   feeding supplement  1 Container Oral BID BM   insulin  aspart  0-9 Units Subcutaneous Q6H   metoprolol  tartrate  5 mg Intravenous Q12H   pantoprazole   40 mg Oral Daily   polyethylene glycol  34 g Oral BID   rosuvastatin   20 mg Oral QHS   senna  1 tablet Oral BID   Continuous Infusions:  TPN ADULT (ION) 80 mL/hr at 05/23/23 0518   TPN ADULT (ION)      Total time spent in review of labs and imaging, patient evaluation, formulation of plan, documentation and communication with patient/family: 35 minutes  Radiology Studies: DG Abd Portable 1V Result Date: 05/23/2023 CLINICAL DATA:  Follow up small bowel obstruction EXAM: PORTABLE ABDOMEN - 1 VIEW COMPARISON:  05/22/2023 FINDINGS: Scattered large and small bowel gas is noted. Persistent small bowel dilatation is noted stable from the previous day. No free air is seen. No bony abnormality is noted. IMPRESSION: Persistent small bowel dilatation stable from the previous day. Electronically Signed   By: Violeta Grey M.D.    On: 05/23/2023 09:48   DG Abd Portable 1V Result Date: 05/22/2023 CLINICAL DATA:  Follow up small bowel obstruction EXAM: PORTABLE ABDOMEN - 1 VIEW COMPARISON:  05/18/2023 FINDINGS: Previously seen colonic contrast has cleared. Persistent small bowel dilatation is noted consistent with the given clinical history of partial small bowel obstruction. No free air is noted. No bony abnormality is seen. IMPRESSION: Persistent small bowel dilatation. Electronically Signed   By: Violeta Grey M.D.   On: 05/22/2023 10:54    LOS: 7 days   Lesa Rape, MD [facility] 05/23/2023, 11:58 AM

## 2023-05-23 NOTE — Consult Note (Addendum)
 WOC Nurse ostomy follow-up consult note Current pouch was changed by the bedside nurse on 4/22 and is intact with good seal, small amt liquid brown stool. Pt requests assistance with pouch change tomorrow. 5 extra set of supplies left in the room for patient and staff nurse use. Use Supplies: barrier ring Lawson # [id number] and convex pouch #[id number] Thank-you,  Wiliam Harder MSN, RN, [place], [place], CNS [phone]

## 2023-05-23 NOTE — Progress Notes (Signed)
 Progress Note     Subjective: CC: Hungry Reports no abdominal pain or nausea currently. Stable mild distension. Tolerating cld without worsening abdominal pain, distension, n/v. Feels she gets "hunger pains" that are located in her epigastrium w/ associated nausea and last a few minutes before self resolving. Continues to have ostomy output - 150cc/24 hours. Mobilized in halls x 5 yesterday.   Afebrile. HR 100. No hypotension. No recent CBC. K, Mg, Phos wnl.  Objective: Vital signs in last 24 hours: Temp:  [98.1 F (36.7 C)-98.7 F (37.1 C)] 98.1 F (36.7 C) (04/24 0441) Pulse Rate:  [86-100] 100 (04/24 0441) Resp:  [18-19] 18 (04/24 0441) BP: (131-139)/(70-78) 139/78 (04/24 0441) SpO2:  [95 %-99 %] 99 % (04/24 0441) Last BM Date : 05/22/23  Intake/Output from previous day: 04/23 0701 - 04/24 0700 In: 3297.4 [P.O.:240; I.V.:3057.4] Out: 100 [Stool:100] Intake/Output this shift: No intake/output data recorded.  PE: General: pleasant, WD, WN female who is laying in bed in NAD Abd: Soft, mild distension, mild R sided ttp, stoma viable with mild prolapse and liquid stool in ostomy bag Psych: A&Ox3 with an appropriate affect.    Lab Results:  No results for input(s): "WBC", "HGB", "HCT", "PLT" in the last 72 hours.  BMET Recent Labs    05/22/23 0451 05/23/23 0438  NA 137 135  K 4.3 4.4  CL 104 103  CO2 25 23  GLUCOSE 148* 193*  BUN 10 12  CREATININE 0.47 0.50  CALCIUM  9.0 9.4   PT/INR No results for input(s): "LABPROT", "INR" in the last 72 hours. CMP     Component Value Date/Time   NA 135 05/23/2023 0438   K 4.4 05/23/2023 0438   CL 103 05/23/2023 0438   CO2 23 05/23/2023 0438   GLUCOSE 193 (H) 05/23/2023 0438   BUN 12 05/23/2023 0438   CREATININE 0.50 05/23/2023 0438   CREATININE 0.64 04/26/2023 1043   CALCIUM  9.4 05/23/2023 0438   PROT 6.4 (L) 05/23/2023 0438   ALBUMIN  3.1 (L) 05/23/2023 0438   AST 15 05/23/2023 0438   AST 11 (L) 04/26/2023 1043    ALT 10 05/23/2023 0438   ALT 7 04/26/2023 1043   ALKPHOS 61 05/23/2023 0438   BILITOT 0.7 05/23/2023 0438   BILITOT 0.7 04/26/2023 1043   GFRNONAA >60 05/23/2023 0438   GFRNONAA >60 04/26/2023 1043   GFRAA 94 06/04/2007 1522   Lipase     Component Value Date/Time   LIPASE 24 05/16/2023 1031       Studies/Results: DG Abd Portable 1V Result Date: 05/22/2023 CLINICAL DATA:  Follow up small bowel obstruction EXAM: PORTABLE ABDOMEN - 1 VIEW COMPARISON:  05/18/2023 FINDINGS: Previously seen colonic contrast has cleared. Persistent small bowel dilatation is noted consistent with the given clinical history of partial small bowel obstruction. No free air is noted. No bony abnormality is seen. IMPRESSION: Persistent small bowel dilatation. Electronically Signed   By: Violeta Grey M.D.   On: 05/22/2023 10:54     Anti-infectives: Anti-infectives (From admission, onward)    None        Assessment/Plan SBO Peritoneal carcinomatosis on chemotherapy History of ex lap with radical tumor debulking including total omentectomy, resection of peritoneal disease, SBR with reanastomosis, en bloc resection of the left fallopian tube and ovary as well as rectosigmoid colon, left ureterolysis, right salpingo-oophorectomy, end descending colostomy 11/2022 by Dr. Orvil Bland. Now s/p 4 cycles of adj C/T (last on 03/25/23). She was on [place]  prior to admission.    -  CT scan concerning for early or intermittent small bowel obstruction with transition at the level of the prior ileocolic anastomosis - Suspect partial SBO, as patient continues to have ostomy output - Passed contrast into colon 4/19  - Steroids per GYN ONC - Cont BID 34 g miralax  and BID Senna, stomal suppositories as well - Keep K >=4, Phos >= 3, Mg >= 2 and mobilize for bowel function - Repeat KUB this AM. If reassuring, plan to adv to FLD  Surgical intervention would be difficult and higher risk due to recent chemotherapy and peritoneal  carcinomatosis.   FEN: CLD, TPN ID: None VTE: Please hold Eliquis .  Okay for heparin  drip if clinically indicated    LOS: 7 days   I reviewed Consultant GYN-ONC notes, hospitalist notes, last 24 h vitals and pain scores, last 48 h intake and output, last 24 h labs and trends, and last 24 h imaging results.  This care required moderate level of medical decision making.    Delton Filbert, [facility] 05/23/2023, 8:45 AM Please see Amion for pager number during day hours 7:00am-4:30pm

## 2023-05-24 DIAGNOSIS — K56600 Partial intestinal obstruction, unspecified as to cause: Secondary | ICD-10-CM | POA: Diagnosis not present

## 2023-05-24 LAB — COMPREHENSIVE METABOLIC PANEL WITH GFR
ALT: 11 U/L (ref 0–44)
AST: 14 U/L — ABNORMAL LOW (ref 15–41)
Albumin: 3 g/dL — ABNORMAL LOW (ref 3.5–5.0)
Alkaline Phosphatase: 61 U/L (ref 38–126)
Anion gap: 9 (ref 5–15)
BUN: 16 mg/dL (ref 8–23)
CO2: 21 mmol/L — ABNORMAL LOW (ref 22–32)
Calcium: 9.3 mg/dL (ref 8.9–10.3)
Chloride: 104 mmol/L (ref 98–111)
Creatinine, Ser: 0.41 mg/dL — ABNORMAL LOW (ref 0.44–1.00)
GFR, Estimated: >60 mL/min (ref >60)
Glucose, Bld: 146 mg/dL — ABNORMAL HIGH (ref 70–99)
Potassium: 3.9 mmol/L (ref 3.5–5.1)
Sodium: 134 mmol/L — ABNORMAL LOW (ref 135–145)
Total Bilirubin: 0.3 mg/dL (ref 0.0–1.2)
Total Protein: 6.5 g/dL (ref 6.5–8.1)

## 2023-05-24 LAB — GLUCOSE, CAPILLARY
Glucose-Capillary: 171 mg/dL — ABNORMAL HIGH (ref 70–99)
Glucose-Capillary: 158 mg/dL — ABNORMAL HIGH (ref 70–99)
Glucose-Capillary: 145 mg/dL — ABNORMAL HIGH (ref 70–99)

## 2023-05-24 LAB — PHOSPHORUS: Phosphorus: 3 mg/dL (ref 2.5–4.6)

## 2023-05-24 LAB — MAGNESIUM: Magnesium: 2 mg/dL (ref 1.7–2.4)

## 2023-05-24 MED ORDER — ENSURE ENLIVE PO LIQD
237.0000 mL | Freq: Three times a day (TID) | ORAL | Status: DC
  Administered 2023-05-24 – 2023-06-01 (×14): 237 mL via ORAL

## 2023-05-24 MED ORDER — POTASSIUM CHLORIDE CRYS ER 20 MEQ PO TBCR
20.0000 meq | EXTENDED_RELEASE_TABLET | Freq: Once | ORAL | Status: AC
  Administered 2023-05-24: 20 meq via ORAL
  Filled 2023-05-24: qty 1

## 2023-05-24 MED ORDER — TRAVASOL 10 % IV SOLN
INTRAVENOUS | Status: AC
  Filled 2023-05-24: qty 499.2

## 2023-05-24 NOTE — Progress Notes (Signed)
 PROGRESS NOTE Rachel Carlson  QVZ:[id number] DOB: Aug 14, 1945 DOA: 05/16/2023 PCP: Clemens Curt, MD  Brief Narrative/Hospital Course: 78 year old female with ovarian cancer and carcinomatosis s/p ex lap with tumor debulking, total omentectomy, SBR with 3 anastomosis, resection of ovary and rectosigmoid., DVT, GERD admitted last night for possible partial small bowel obstruction with probable transition point at the level of colonic anastomosis. 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 patient wants to stay on TPN at least today Per [facility] patient with difficult and higher risks for surgical intervention Per GYN oncology Decadron  has been started 4/23 as suspecting it is malignant partial small bowel obstruction due to small volume and peritoneal disease.   Hypokalemia Hyponatremia: Resolved.   Painful swallowing Throat pain: Likely in the setting of NG tube insertion/trauma-reports she is feeling much better after NG removal  improving every day.  Continue supportive care.  Neck exam grossly normal   Hypertension: BP stable. Monitor and cont prn. PTA not taking metoprolol -discontinued.    DVT prophylaxis: enoxaparin  (LOVENOX ) injection 40 mg Start: 05/16/23 2200 Code Status:   Code Status: Full Code Family Communication: plan of care discussed with patient at bedside. Patient status is: Remains hospitalized because of severity of illness Level of care: Med-Surg   Dispo: The patient is from: HOME            Anticipated disposition: TBD Objective: Vitals last 24 hrs: Vitals:   05/23/23 1303 05/23/23 1913 05/23/23 2100 05/24/23 0516  BP: 132/73 137/67 131/69 120/65  Pulse: 79 88 93 83  Resp: 18 15  18   Temp: 97.8 F (36.6 C) 98.3 F (36.8 C)  98 F (36.7 C)  TempSrc: Oral Oral    SpO2: 97% 96%  96%  Weight:      Height:        Physical Examination: General exam: alert awake, older than stated age HEENT:Oral mucosa moist, Ear/Nose WNL grossly Respiratory system: Bilaterally diminished BS, no use of accessory muscle Cardiovascular system: S1 & S2 +. Gastrointestinal system: Abdomen soft, NT,ND,BS+, colostomy + w/ liquid stool Nervous System: Alert, awake,following commands. Extremities: LE edema neg, moving arms LEG, warm legs Skin: No rashes,warm. MSK: Normal muscle bulk/tone.   Data Reviewed: I have personally reviewed following labs and imaging studies ( see epic result tab) CBC: Recent Labs  Lab 05/17/23 1020 05/18/23 0935  WBC 2.9* 4.1  NEUTROABS 1.5* 2.3  HGB 11.1* 12.4  HCT 34.1* 38.3  MCV 97.4 96.5  PLT 186 219   CMP: Recent Labs  Lab 05/20/23 0424 05/21/23 0436 05/22/23 0451 05/23/23 0438 05/24/23 0721  NA 133* 132* 137 135 134*  K  3.5 3.3* 4.3 4.4 3.9  CL 103 102 104 103 104  CO2 21* 24 25 23  21*  GLUCOSE 131* 154* 148* 193* 146*  BUN 9 9 10 12 16   CREATININE 0.48 0.44 0.47 0.50 0.41*  CALCIUM  8.3* 8.2* 9.0 9.4 9.3  MG 1.8 2.1 2.0 2.0 2.0  PHOS 2.5 3.1 3.8 3.5  3.0   GFR: Estimated Creatinine Clearance: 56.1 mL/min (A) (by C-G formula based on SCr of 0.41 mg/dL (L)). Recent Labs  Lab 05/20/23 0424 05/21/23 0436 05/22/23 0451 05/23/23 0438 05/24/23 0721  AST 12* 11* 12* 15 14*  ALT 9 8 9 10 11   ALKPHOS 51 51 56 61 61  BILITOT 0.9 0.6 0.5 0.7 0.3  PROT 5.6* 5.4* 5.9* 6.4* 6.5  ALBUMIN  2.7* 2.6* 2.8* 3.1* 3.0*    No results for input(s): "LIPASE", "AMYLASE" in the last 168 hours.  No results for input(s): "AMMONIA" in the last 168 hours. Coagulation Profile: No results for input(s): "INR", "PROTIME" in the last 168 hours.  Medications reviewed:  Scheduled Meds:  vitamin C   1,000 mg Oral Daily   Chlorhexidine  Gluconate Cloth  6 each Topical Daily   dexamethasone   6 mg Intravenous Q24H   enoxaparin  (LOVENOX ) injection  40 mg Subcutaneous Q24H   feeding supplement  237 mL Oral BID BM   insulin  aspart  0-9 Units Subcutaneous Q6H   metoprolol  tartrate  5 mg Intravenous Q12H   pantoprazole   40 mg Oral Daily   polyethylene glycol  34 g Oral BID   rosuvastatin   20 mg Oral QHS   senna  1 tablet Oral BID   Continuous Infusions:  TPN ADULT (ION) 80 mL/hr at 05/23/23 1815    Total time spent in review of labs and imaging, patient evaluation, formulation of plan, documentation and communication with patient/family: 35 minutes  Radiology Studies: DG Abd Portable 1V Result Date: 05/23/2023 CLINICAL DATA:  Follow up small bowel obstruction EXAM: PORTABLE ABDOMEN - 1 VIEW COMPARISON:  05/22/2023 FINDINGS: Scattered large and small bowel gas is noted. Persistent small bowel dilatation is noted stable from the previous day. No free air is seen. No bony abnormality is noted. IMPRESSION: Persistent small bowel dilatation stable from the previous day. Electronically Signed   By: Violeta Grey M.D.   On: 05/23/2023 09:48    LOS: 8 days   Lesa Rape, MD [facility] 05/24/2023, 9:46 AM

## 2023-05-24 NOTE — Progress Notes (Signed)
 Mobility Specialist - Progress Note   05/24/23 1353  Mobility  Activity Ambulated independently in hallway  Level of Assistance Independent  Assistive Device None  Distance Ambulated (ft) 1050 ft  Activity Response Tolerated well  Mobility Referral Yes  Mobility visit 1 Mobility  Mobility Specialist Start Time (ACUTE ONLY) 1325  Mobility Specialist Stop Time (ACUTE ONLY) 1352  Mobility Specialist Time Calculation (min) (ACUTE ONLY) 27 min   Pt received in bed and agreeable to mobility. No complaints during session. Pt to bed after session with all needs met.    [facility]

## 2023-05-24 NOTE — Progress Notes (Signed)
 Nutrition Follow-up  DOCUMENTATION CODES:   Not applicable  INTERVENTION:  - Plan to wean to half TPN tonight.              - TPN management per pharmacy.    - Soft diet per CCS.  - Ensure Plus High Protein po TID, each supplement provides 350 kcal and 20 grams of protein.   - Daily weights.   NUTRITION DIAGNOSIS:   Inadequate oral intake related to other (see comment) (prolonged ileus) as evidenced by NPO status. 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 Abdominal CT on 05/16/23 showed findings with concern for SBO and abd xray on 4/19 showed partial SBO.  4/17 Admit; NGT placed for suction 4/18 CLD 4/19 NPO 4/20 TPN Initiated  4/21 CLD; NGT removed 4/24 FLD 4/25 Soft diet, weaning to half TPN  Patient reports she is doing well with slow diet advancement. She was advanced to a soft diet this AM but not in time to get it at breakfast. She is excited to have more options but knows she should increase intake and food choices gradually and assess tolerance.  She did not like Boost Breeze but RD switched her to Ensure once diet was advanced to full liquids and she has been consuming Ensure twice daily since that time. Will increase to TID to support intake while still progressing diet.   Per [facility]CS, plan to wean TPN to half today. Anticipate TPN to likely be weaned off tomorrow if patient does well with Soft  diet today.    Medications reviewed and include: 1000mg  vitamin C , Miralax , Senokot  Labs reviewed:  Na 134   Diet Order:   Diet Order             DIET SOFT Fluid consistency: Thin  Diet effective now                   EDUCATION NEEDS:  Not appropriate for education at this time  Skin:  Skin Assessment: Reviewed RN Assessment  Last BM:  4/25 - colostomy  Height:  Ht Readings from Last 1 Encounters:  05/16/23 5\' 2"  (1.575 m)   Weight:  Wt Readings from Last 1 Encounters:  05/21/23 71 kg    BMI:  Body mass index is 28.63 kg/m.  Estimated Nutritional Needs:  Kcal:  1750-2000 Protein:  90-115 Fluid:  >1.75 L/day or per MD    Scheryl Cushing RD, LDN Contact via Secure Chat.

## 2023-05-24 NOTE — Progress Notes (Signed)
 Gynecologic Oncology Progress Note   Subjective: Patient reports having an "uneventful" night again. No nausea or emesis. Voiding without difficulty. Reports increased output from the ostomy this am compared to the last. Abdominal cramping is intermittent and more when stomach is empty or when drinking liquids. No chest pain, dyspnea. Ambulating in the halls frequently without difficulty.  Objective: Vital signs in last 24 hours: Temp:  [97.8 F (36.6 C)-98.3 F (36.8 C)] 98 F (36.7 C) (04/25 0516) Pulse Rate:  [79-93] 83 (04/25 0516) Resp:  [15-18] 18 (04/25 0516) BP: (120-137)/(65-73) 120/65 (04/25 0516) SpO2:  [96 %-97 %] 96 % (04/25 0516) Last BM Date : 05/23/23  Intake/Output from previous day: 04/24 0701 - 04/25 0700 In: 1889.9 [P.O.:120; I.V.:1769.9] Out: 375 [Stool:375]  Physical Examination: General: alert, cooperative, and no distress Resp: clear to auscultation bilaterally Cardio: regular rate and rhythm, S1, S2 normal, no murmur, click, rub or gallop GI: active bowel sounds, abdomen tympanic, soft, distended, ostomy with small amount of gas and liquid stool, stoma remains mildly prolapsed/non-edematous/stoma pink and viable Extremities: extremities normal, atraumatic, no cyanosis or edema Right chest port accessed with intact dressing with no surrounding erythema with TPN infusing. Left facial bruising from recent dermatology procedure is improving  Labs:    Latest Ref Rng & Units 05/18/2023    9:35 AM 05/17/2023   10:20 AM 05/17/2023    3:11 AM  CBC  WBC 4.0 - 10.5 K/uL 4.1  2.9  2.9   Hemoglobin 12.0 - 15.0 g/dL 16.1  09.6  04.5   Hematocrit 36.0 - 46.0 % 38.3  34.1  35.8   Platelets 150 - 400 K/uL 219  186  188       Latest Ref Rng & Units 05/23/2023    4:38 AM 05/22/2023    4:51 AM 05/21/2023    4:36 AM  BMP  Glucose 70 - 99 mg/dL 409  811  914   BUN 8 - 23 mg/dL 12  10  9    Creatinine 0.44 - 1.00 mg/dL 7.82  9.56  2.13   Sodium 135 - 145 mmol/L 135  137   132   Potassium 3.5 - 5.1 mmol/L 4.4  4.3  3.3   Chloride 98 - 111 mmol/L 103  104  102   CO2 22 - 32 mmol/L 23  25  24    Calcium  8.9 - 10.3 mg/dL 9.4  9.0  8.2    0/86/57: KUB FINDINGS: A kinked enteric tube is seen with its distal tip overlying the expected region of the gastroesophageal junction. This is unchanged in appearance when compared to the prior study. Persistent dilated, air distended loops of small bowel are seen within the mid abdomen. Radiopaque contrast is also seen within the ascending and descending colon. No radio-opaque calculi are seen. IMPRESSION: Findings consistent with a partial small bowel obstruction.  05/22/2023: KUB: Persistent small bowel dilatation.   05/23/2023: KUB: Persistent small bowel dilatation stable from the previous day.   Assessment: 78 y.o. admitted with partial small bowel obstruction with transition at the level of prior ileocolic anastomosis. 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 Now s/p 4 cycles of adj C/T (last on 03/25/23). She was currently on Zejula  prior to admission.   Partial SBO: Monitoring ostomy output-375 cc yesterday. NG removed 4/21. Episode of emesis 4/22 pm. Xray performed last 05/23/23 am with persistent  SB dilation (stable from previous day). Dexamethasone  started 05/22/2023.  Dysuria: Improved per pt. Urine culture negative.    Peritoneal carcinoma: Hold PARPi until tolerating PO.   Stoma prolapse: Stoma healthy, much less edematous than during recent prior admission.  Patient is scheduled to see Dr. Camilo Cella at the end of the month.   Leukopenia: resolved. 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 Repeat doppler in 10/2022 negative for DVT. Per Dr. Orvil Bland, ok for patient to be on prophylactic lovenox  during  hospital stay, can restart Eliquis  on discharge.  Continue with current plan of care. Dexamethasone  started 05/22/23 in pm. In the setting of known malignancy, this may be a malignant partial small bowel obstruction (caused by small volume/peritoneal disease).   Patient seen with Gen Surg Team. Plan for advancement of diet to soft diet as tolerated. Continue with current plan of care. No needs voiced per pt at this time.   LOS: 8 days    Rachel Carlson 05/24/2023, 8:44 AM

## 2023-05-24 NOTE — Plan of Care (Signed)

## 2023-05-24 NOTE — Progress Notes (Signed)
 PHARMACY - TOTAL PARENTERAL NUTRITION CONSULT NOTE   Indication: Prolonged ileus  Patient Measurements: Height: 5\' 2"  (157.5 cm) Weight: 71 kg (156 lb 8.4 oz) IBW/kg (Calculated) : 50.1 TPN AdjBW (KG): 55.7 Body mass index is 28.63 kg/m. 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 Abdominal CT on 05/16/23 showed findings with concern for SBO and abd xray on 4/19 showed partial SBO. Pharmacy has been consulted on 05/19/23 to start TPN for prolonged ileus.  Glucose / Insulin : no hx DM; - cbgs (goal <150) 146 - 185 - 8 units SSI in last 24 hrs. Daily Decadron  started 4/23 is likely causing elevation in CBGs. Electrolytes: Sodium slightly low at 134, potassium slightly below goal at 3.9, others WNL including CoCa 10.1 - Goal for ileus: Mag >2, K >4 Renal: Scr/BUN WNL Hepatic: albumin  low but improved; LFTs, alk phos, and Tbili WNL Intake / Output; MIVF: - NGT removed 4/21  - UOP: not recorded - Stool output: 375 mls (colostomy) - FLD intake: 120 mls (has drank Ensure plus x2) GI Imaging: - 4/17 abd CT: Findings concerning for early or intermittent small bowel obstruction with transition at the level of prior ileocolic anastomosis. Bilateral pleural effusions  - 4/19 abd xray: Findings consistent with a partial small bowel obstruction  - 4/23 abd xray: persistent small bowel dilation - 4/24 abd xray: persistent small bowel dilation stable from the previous study GI Surgeries / Procedures:   Central access: Port-a- cath  TPN start date: 05/19/23  Nutritional Goals: Goal TPN  rate is 80 mL/hr (provides 99.8 g of protein and 1896 kcals per day)  RD Assessment:  Estimated Needs Total Energy Estimated Needs: 1750-2000 Total Protein Estimated Needs: 90-115 Total Fluid Estimated Needs: >1.75 L/day or per MD  Current Nutrition:  TPN, CLD (starting 4/21) 4/22 pt reports poor PO intake d/t sore throat 4/23 50% of 2 CLD meals recorded, less throat pain 4/24 repeat KUB reassuring, advanced to FLD 4/25 pt tolerated FLD 4/24 with no N/V >> advance to soft diet today and wean TPN, per surgery. MD discussed with patient who would feel more comfortable receiving TPN x1 more day pending soft diet toleration.   Plan:  Now: - Give PO Kcl 20 mEq x1  At 1800:  - Decrease TPN to 40 mL/hr    - Discussed with MD, doing a lower TPN rate since starting soft diet 4/25 - Electrolytes in TPN:  Na 65mEq/L - increased K 50mEq/L Ca 33mEq/L Mg 71mEq/L Phos 15mmol/L Cl:Ac 1:2 - Add standard MVI and trace elements to TPN - Continue sSSI q6h given increase in CBGs over last 24hrs - At minimum, monitor TPN labs on Mon/Thurs.  - F/u tolerance of soft diet and expected to wean off TPN 4/26   Dara Ear, PharmD Candidate 05/24/2023 9:56 AM

## 2023-05-24 NOTE — Progress Notes (Signed)
 Progress Note     Subjective: CC: Hungry Tolerating fld without abdominal pain, n/v. Doesn't feel distended. Having ostomy output - 375cc/24 hours. Mobilized in halls x 6 yesterday.   Afebrile. No tachycardia or hypotension. No recent CBC. K, Mg, Phos wnl.   Objective: Vital signs in last 24 hours: Temp:  [97.8 F (36.6 C)-98.3 F (36.8 C)] 98 F (36.7 C) (04/25 0516) Pulse Rate:  [79-93] 83 (04/25 0516) Resp:  [15-18] 18 (04/25 0516) BP: (120-137)/(65-73) 120/65 (04/25 0516) SpO2:  [96 %-97 %] 96 % (04/25 0516) Last BM Date : 05/23/23  Intake/Output from previous day: 04/24 0701 - 04/25 0700 In: 1889.9 [P.O.:120; I.V.:1769.9] Out: 375 [Stool:375] Intake/Output this shift: Total I/O In: -  Out: 50 [Stool:50]  PE: General: pleasant, WD, WN female who is laying in bed in NAD Abd: Soft, mild distension, NT, stoma viable with mild prolapse and liquid stool in ostomy bag  Lab Results:  No results for input(s): "WBC", "HGB", "HCT", "PLT" in the last 72 hours.  BMET Recent Labs    05/23/23 0438 05/24/23 0721  NA 135 134*  K 4.4 3.9  CL 103 104  CO2 23 21*  GLUCOSE 193* 146*  BUN 12 16  CREATININE 0.50 0.41*  CALCIUM  9.4 9.3   PT/INR No results for input(s): "LABPROT", "INR" in the last 72 hours. CMP     Component Value Date/Time   NA 134 (L) 05/24/2023 0721   K 3.9 05/24/2023 0721   CL 104 05/24/2023 0721   CO2 21 (L) 05/24/2023 0721   GLUCOSE 146 (H) 05/24/2023 0721   BUN 16 05/24/2023 0721   CREATININE 0.41 (L) 05/24/2023 0721   CREATININE 0.64 04/26/2023 1043   CALCIUM  9.3 05/24/2023 0721   PROT 6.5 05/24/2023 0721   ALBUMIN  3.0 (L) 05/24/2023 0721   AST 14 (L) 05/24/2023 0721   AST 11 (L) 04/26/2023 1043   ALT 11 05/24/2023 0721   ALT 7 04/26/2023 1043   ALKPHOS 61 05/24/2023 0721   BILITOT 0.3 05/24/2023 0721   BILITOT 0.7 04/26/2023 1043   GFRNONAA >60 05/24/2023 0721   GFRNONAA >60 04/26/2023 1043   GFRAA 94 06/04/2007 1522   Lipase      Component Value Date/Time   LIPASE 24 05/16/2023 1031       Studies/Results: DG Abd Portable 1V Result Date: 05/23/2023 CLINICAL DATA:  Follow up small bowel obstruction EXAM: PORTABLE ABDOMEN - 1 VIEW COMPARISON:  05/22/2023 FINDINGS: Scattered large and small bowel gas is noted. Persistent small bowel dilatation is noted stable from the previous day. No free air is seen. No bony abnormality is noted. IMPRESSION: Persistent small bowel dilatation stable from the previous day. Electronically Signed   By: Violeta Grey M.D.   On: 05/23/2023 09:48     Anti-infectives: Anti-infectives (From admission, onward)    None        Assessment/Plan SBO Peritoneal carcinomatosis on chemotherapy History of ex lap with radical tumor debulking including total omentectomy, resection of peritoneal disease, SBR with reanastomosis, en bloc resection of the left fallopian tube and ovary as well as rectosigmoid colon, left ureterolysis, right salpingo-oophorectomy, end descending colostomy 11/2022 by Dr. Orvil Bland. Now s/p 4 cycles of adj C/T (last on 03/25/23). She was on Zejula  prior to admission.    - CT scan concerning for early or intermittent small bowel obstruction with transition at the level of the prior ileocolic anastomosis - Suspect partial SBO - Passed contrast into colon 4/19  - Steroids  per GYN ONC - Cont BID 34 g miralax  and BID Senna, stomal suppositories as well - Keep K >=4, Phos >= 3, Mg >= 2 and mobilize for bowel function - Discussed w/ GYN ONC today. Trial of soft diet today  Surgical intervention would be difficult and higher risk due to recent chemotherapy and peritoneal carcinomatosis.   FEN: Soft diet. Can wean TPN from our standpoint.  ID: None VTE: Please hold Eliquis .  Okay for heparin  drip if clinically indicated    LOS: 8 days   I reviewed Consultant GYN-ONC notes, hospitalist notes, last 24 h vitals and pain scores, last 48 h intake and output, last 24 h labs and  trends, and last 24 h imaging results.  This care required moderate level of medical decision making.    Delton Filbert, [facility] Surgery 05/24/2023, 9:56 AM Please see Amion for pager number during day hours 7:00am-4:30pm

## 2023-05-24 NOTE — Consult Note (Addendum)
 WOC Nurse ostomy consult note Pt had ostomy surgery 11/24 and is independent with pouch application and emptying prior to admission when at home, but Pt states she currently is unable to visualize the stoma when in bed. She is followed in the outpatient ostomy clinic for stoma prolapse.  Currently, stoma is red and viable, above skin level,  minimal prolapse, much less then previous visit. Cut opening cut to 1 3/4 inches. Applied barrier ring and one piece convex pouch.  50cc liquid brown stool in the pouch. She used stoma powder and skin prep wipes to crust the skin. Applied belt.   5 sets of each supply have been provided for the patient and are in her luggage, as well as one set left at the bedside for staff nurse's use. Use Supplies: barrier ring Lawson # [id number] and convex pouch [id number] She requests another pouch change be performed on Tues if she is still in the hospital at that time.  Thank-you,  Wiliam Harder MSN, RN, CWOCN, [place], CNS [phone]

## 2023-05-25 DIAGNOSIS — K56600 Partial intestinal obstruction, unspecified as to cause: Secondary | ICD-10-CM | POA: Diagnosis not present

## 2023-05-25 LAB — BASIC METABOLIC PANEL WITH GFR
Anion gap: 9 (ref 5–15)
BUN: 19 mg/dL (ref 8–23)
CO2: 26 mmol/L (ref 22–32)
Calcium: 9.4 mg/dL (ref 8.9–10.3)
Chloride: 101 mmol/L (ref 98–111)
Creatinine, Ser: 0.56 mg/dL (ref 0.44–1.00)
GFR, Estimated: >60 mL/min (ref >60)
Glucose, Bld: 153 mg/dL — ABNORMAL HIGH (ref 70–99)
Potassium: 4.1 mmol/L (ref 3.5–5.1)
Sodium: 136 mmol/L (ref 135–145)

## 2023-05-25 LAB — GLUCOSE, CAPILLARY
Glucose-Capillary: 122 mg/dL — ABNORMAL HIGH (ref 70–99)
Glucose-Capillary: 131 mg/dL — ABNORMAL HIGH (ref 70–99)
Glucose-Capillary: 135 mg/dL — ABNORMAL HIGH (ref 70–99)
Glucose-Capillary: 181 mg/dL — ABNORMAL HIGH (ref 70–99)

## 2023-05-25 LAB — PHOSPHORUS: Phosphorus: 4 mg/dL (ref 2.5–4.6)

## 2023-05-25 LAB — MAGNESIUM: Magnesium: 2 mg/dL (ref 1.7–2.4)

## 2023-05-25 MED ORDER — SODIUM CHLORIDE 0.45 % IV SOLN: INTRAVENOUS | Status: AC

## 2023-05-25 MED ORDER — PROCHLORPERAZINE EDISYLATE 10 MG/2ML IJ SOLN
10.0000 mg | Freq: Four times a day (QID) | INTRAMUSCULAR | Status: AC | PRN
  Administered 2023-05-25 – 2023-05-26 (×2): 10 mg via INTRAVENOUS
  Filled 2023-05-25 (×2): qty 2

## 2023-05-25 MED ORDER — DEXAMETHASONE SODIUM PHOSPHATE 10 MG/ML IJ SOLN
6.0000 mg | INTRAMUSCULAR | Status: DC
  Administered 2023-05-25 – 2023-05-31 (×7): 6 mg via INTRAVENOUS
  Filled 2023-05-25 (×7): qty 1

## 2023-05-25 MED ORDER — TRAVASOL 10 % IV SOLN
INTRAVENOUS | Status: AC
  Filled 2023-05-25: qty 499.2

## 2023-05-25 NOTE — Progress Notes (Signed)
 Paged GYN surgery "pt has been having several episodes of emesis and dry heaving today.  gen surgery asked me to message you.  zofran  not working.  please advise.  thanks!"

## 2023-05-25 NOTE — Progress Notes (Signed)
 Subjective/Chief Complaint: Abdominal pain  Patient with cramps with her p.o. intake today.  Still having ostomy output.  1 episode of vomiting today.  Objective: Vital signs in last 24 hours: Temp:  [97.7 F (36.5 C)-98.3 F (36.8 C)] 97.8 F (36.6 C) (04/26 0528) Pulse Rate:  [68-105] 68 (04/26 0528) Resp:  [17-18] 17 (04/26 0528) BP: (119-140)/(52-76) 122/52 (04/26 0528) SpO2:  [97 %-98 %] 97 % (04/26 0528) Last BM Date : 05/24/23  Intake/Output from previous day: 04/25 0701 - 04/26 0700 In: 1080 [P.O.:1080] Out: 350 [Stool:350] Intake/Output this shift: No intake/output data recorded.  Abdomen: Mildly distended.  Ostomy is prolapsed with functioning and viable left lower quadrant.  No rebound or guarding.  Scar noted.  Lab Results:  No results for input(s): "WBC", "HGB", "HCT", "PLT" in the last 72 hours. BMET Recent Labs    05/24/23 0721 05/25/23 0728  NA 134* 136  K 3.9 4.1  CL 104 101  CO2 21* 26  GLUCOSE 146* 153*  BUN 16 19  CREATININE 0.41* 0.56  CALCIUM  9.3 9.4   PT/INR No results for input(s): "LABPROT", "INR" in the last 72 hours. ABG No results for input(s): "PHART", "HCO3" in the last 72 hours.  Invalid input(s): "PCO2", "PO2"  Studies/Results: DG Abd Portable 1V Result Date: 05/23/2023 CLINICAL DATA:  Follow up small bowel obstruction EXAM: PORTABLE ABDOMEN - 1 VIEW COMPARISON:  05/22/2023 FINDINGS: Scattered large and small bowel gas is noted. Persistent small bowel dilatation is noted stable from the previous day. No free air is seen. No bony abnormality is noted. IMPRESSION: Persistent small bowel dilatation stable from the previous day. Electronically Signed   By: Violeta Grey M.D.   On: 05/23/2023 09:48    Anti-infectives: Anti-infectives (From admission, onward)    None       Assessment/Plan:  SBO Peritoneal carcinomatosis on chemotherapy History of ex lap with radical tumor debulking including total omentectomy, resection  of peritoneal disease, SBR with reanastomosis, en bloc resection of the left fallopian tube and ovary as well as rectosigmoid colon, left ureterolysis, right salpingo-oophorectomy, end descending colostomy 11/2022 by Dr. Orvil Bland. Now s/p 4 cycles of adj C/T (last on 03/25/23). She was on Zejula  prior to admission.    - CT scan concerning for early or intermittent small bowel obstruction with transition at the level of the prior ileocolic anastomosis - Suspect partial SBO - Passed contrast into colon 4/19  - Steroids per GYN ONC - Cont BID 34 g miralax  and BID Senna, stomal suppositories as well - Keep K >=4, Phos >= 3, Mg >= 2 and mobilize for bowel function -  Surgical intervention would be difficult and higher risk due to recent chemotherapy and peritoneal carcinomatosis.   FEN: Continue soft diet for now. Can wean TPN from our standpoint.  ID: None VTE: Please hold Eliquis .  Okay for heparin  drip if clinically indicated   Patient with nausea and vomiting today.  Ostomy still functioning.  Concern for progressive mild obstruction from carcinomatosis and progression of cancer.  Discussed this with the patient today.  Continue soft diet for now but if she develops more nausea and vomiting, she may require NG tube decompression.  The other issue is goals of care in her case.  If she does develop progressive obstructive symptoms from carcinomatosis, her only surgical option is a bypass.  The other issue what other treatments she is eligible if any.  Recommend discussion with GYN Onc/medical oncology for goals of care.  General Surgery available depending on above discussions and patients.   condition.     LOS: 9 days    Andy Bannister A Rajendra Spiller  05/25/2023 Moderate complexity

## 2023-05-25 NOTE — Progress Notes (Signed)
 PROGRESS NOTE Rachel Carlson  VWU:[id number] DOB: 1946/01/01 DOA: 05/16/2023 PCP: Clemens Curt, MD  Brief Narrative/Hospital Course: Brief Narrative/Hospital Course: 78 year old female with ovarian cancer and carcinomatosis s/p ex lap with tumor debulking, total omentectomy, SBR with 3 anastomosis, resection of ovary and rectosigmoid., DVT, GERD admitted last night for possible partial small bowel obstruction with probable transition point at the level of colonic anastomosis. 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 History of ex lap with radical tumor debulking surgery discharge various resections and colostomy 11/2022: CT concerning for early or intermittent SBO with transition at the level of prior ileocolic anastomosis, managing conservatively past contrast to the colon>s/p NG clamp trial and removed 4/21-on CLD and on TPN x-ray 4/24-similar persistent small bowel dilatation stable  On FLD- Diet-advanced to soft  4/25-but having nausea and vomiting, ostomy still functioning, per surgery concern for progressive mild obstruction from carcinomatosis and progression of cancer-for now continue soft diet May require NG tube decompression per CCS-also advised goals of care discussion by GYN Continue current MiraLAX  bid senna bid and stomal suppository Continue holding Eliquis  for now until cleared by CCS. Per CCS patient with difficult and higher risks for surgical intervention Per GYN  oncology Decadron  has been started 4/23 as suspecting it is malignant partial small bowel obstruction due to small volume and peritoneal disease.   Hypokalemia Hyponatremia: Resolved.   Painful swallowing Throat pain: Likely in the setting of NG tube insertion/trauma-much improved following NG tube removal.     Hypertension: BP stable. PTA not taking metoprolol -discontinued.    DVT prophylaxis: enoxaparin  (LOVENOX ) injection 40 mg Start: 05/16/23 2200 Code Status:   Code Status: Full Code Family Communication: plan of care discussed with patient at bedside. Patient status is: Remains hospitalized because of severity of illness Level of care: Med-Surg   Dispo: The patient is from: HOME            Anticipated disposition: TBD   Objective: Vitals last 24 hrs: Vitals:   05/24/23 0516 05/24/23 1459 05/24/23 2027 05/25/23 0528  BP: 120/65 119/64 (!) 140/76 (!) 122/52  Pulse: 83 84 (!) 105 68  Resp: 18 18 18 17   Temp: 98 F (36.7 C) 97.7 F (36.5 C) 98.3 F (36.8 C) 97.8 F (36.6 C)  TempSrc:   Oral Oral  SpO2: 96% 98% 97% 97%  Weight:      Height:        Physical Examination: General exam: alert awake, older than stated age HEENT:Oral mucosa moist, Ear/Nose WNL grossly Respiratory system: Bilaterally diminished BS, no use of accessory muscle Cardiovascular system: S1 & S2 +. Gastrointestinal system: Abdomen soft, colostomy present with output,ND,BS+ Nervous System: Alert, awake,following commands. Extremities: LE edema neg, moving arms LEGS, warm legs Skin: No rashes,warm. MSK: Normal muscle bulk/tone.   Data Reviewed: I have personally reviewed following labs and imaging studies ( see epic result tab) CBC:No results for input(s): "WBC", "NEUTROABS", "HGB", "HCT", "MCV", "PLT" in the last 168 hours. CMP: Recent Labs  Lab 05/21/23 0436 05/22/23 0451 05/23/23 2956  05/24/23 0721 05/25/23 0728  NA 132* 137 135 134* 136  K 3.3* 4.3 4.4 3.9 4.1  CL 102 104 103 104  101  CO2 24 25 23  21* 26  GLUCOSE 154* 148* 193* 146* 153*  BUN 9 10 12 16 19   CREATININE 0.44 0.47 0.50 0.41* 0.56  CALCIUM  8.2* 9.0 9.4 9.3 9.4  MG 2.1 2.0 2.0 2.0 2.0  PHOS 3.1 3.8 3.5 3.0 4.0   GFR: Estimated Creatinine Clearance: 56.1 mL/min (by C-G formula based on SCr of 0.56 mg/dL). Recent Labs  Lab 05/20/23 0424 05/21/23 0436 05/22/23 0451 05/23/23 0438 05/24/23 0721  AST 12* 11* 12* 15 14*  ALT 9 8 9 10 11   ALKPHOS 51 51 56 61 61  BILITOT 0.9 0.6 0.5 0.7 0.3  PROT 5.6* 5.4* 5.9* 6.4* 6.5  ALBUMIN  2.7* 2.6* 2.8* 3.1* 3.0*   No results for input(s): "LIPASE", "AMYLASE" in the last 168 hours. No results for input(s): "AMMONIA" in the last 168 hours. Coagulation Profile: No results for input(s): "INR", "PROTIME" in the last 168 hours. Medications reviewed:  Scheduled Meds:  vitamin C   1,000 mg Oral Daily   Chlorhexidine  Gluconate Cloth  6 each Topical Daily   dexamethasone   6 mg Intravenous Q24H   enoxaparin  (LOVENOX ) injection  40 mg Subcutaneous Q24H   feeding supplement  237 mL Oral TID BM   insulin  aspart  0-9 Units Subcutaneous Q6H   metoprolol  tartrate  5 mg Intravenous Q12H   pantoprazole   40 mg Oral Daily   polyethylene glycol  34 g Oral BID   rosuvastatin   20 mg Oral QHS   senna  1 tablet Oral BID   Continuous Infusions:  TPN ADULT (ION) 40 mL/hr at 05/24/23 1812   TPN ADULT (ION)      Total time spent in review of labs and imaging, patient evaluation, formulation of plan, documentation and communication with patient/family: 50 minutes  Lesa Rape, MD [facility] 05/25/2023, 11:25 AM

## 2023-05-25 NOTE — Plan of Care (Signed)

## 2023-05-25 NOTE — Progress Notes (Signed)
 Messaged primary and asked for something for nausea besides zofran  as the zofran  has not worked, pt has been having dry heaving and many episodes of emesis today.  Primary asked me to page surgery.  I paged Dr Delane Fear, on call for general surgery.  He asked me to please message Gyn oncology.  To message GYN  oncology at this time.

## 2023-05-25 NOTE — Progress Notes (Signed)
 Gynecologic Oncology Progress Note   Subjective: Dry heaving this morning  Objective: Vital signs in last 24 hours: Temp:  [97.7 F (36.5 C)-98.3 F (36.8 C)] 97.8 F (36.6 C) (04/26 0528) Pulse Rate:  [68-105] 68 (04/26 0528) Resp:  [17-18] 17 (04/26 0528) BP: (119-140)/(52-76) 122/52 (04/26 0528) SpO2:  [97 %-98 %] 97 % (04/26 0528) Last BM Date : 05/24/23  Intake/Output from previous day: 04/25 0701 - 04/26 0700 In: 1080 [P.O.:1080] Out: 350 [Stool:350]  Physical Examination: General: alert, cooperative, and no distress Resp: clear to auscultation bilaterally Cardio: regular rate and rhythm, S1, S2 normal, no murmur, click, rub or gallop GI: active bowel sounds, abdomen tympanic, ostomy with small amount of gas and liquid stool, stoma remains mildly prolapsed/non-edematous/stoma pink and viable Extremities: extremities normal, atraumatic, no cyanosis or edema Right chest port accessed with intact dressing with no surrounding erythema    Labs:    Latest Ref Rng & Units 05/18/2023    9:35 AM 05/17/2023   10:20 AM 05/17/2023    3:11 AM  CBC  WBC 4.0 - 10.5 K/uL 4.1  2.9  2.9   Hemoglobin 12.0 - 15.0 g/dL 40.9  81.1  91.4   Hematocrit 36.0 - 46.0 % 38.3  34.1  35.8   Platelets 150 - 400 K/uL 219  186  188       Latest Ref Rng & Units 05/25/2023    7:28 AM 05/24/2023    7:21 AM 05/23/2023    4:38 AM  BMP  Glucose 70 - 99 mg/dL 782  956  213   BUN 8 - 23 mg/dL 19  16  12    Creatinine 0.44 - 1.00 mg/dL 0.86  5.78  4.69   Sodium 135 - 145 mmol/L 136  134  135   Potassium 3.5 - 5.1 mmol/L 4.1  3.9  4.4   Chloride 98 - 111 mmol/L 101  104  103   CO2 22 - 32 mmol/L 26  21  23    Calcium  8.9 - 10.3 mg/dL 9.4  9.3  9.4      Assessment: 78 y.o. admitted with partial small bowel obstruction with transition at the level of prior ileocolic anastomosis. 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 Now s/p 4 cycles of adj C/T (last on 03/25/23). She was currently on Zejula  prior to admission.   Partial SBO: Gen Surg notes appreciated. Persistent nausea. Consider continuing TPN for now.   Stoma prolapse: Stoma healthy, much less edematous than during recent prior admission.  Patient is scheduled to see Dr. Camilo Cella at the end of the month.   Leukopenia: resolved. 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 Prophylactic lovenox  during hospital stay, can restart Eliquis  on discharge.    LOS: 9 days    Abdul Hodgkin, MD 05/25/2023, 10:12 AM

## 2023-05-25 NOTE — Progress Notes (Signed)
 PHARMACY - TOTAL PARENTERAL NUTRITION CONSULT NOTE   Indication: Prolonged ileus  Patient Measurements: Height: 5\' 2"  (157.5 cm) Weight: 71 kg (156 lb 8.4 oz) IBW/kg (Calculated) : 50.1 TPN AdjBW (KG): 55.7 Body mass index is 28.63 kg/m. 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 Abdominal CT on 05/16/23 showed findings with concern for SBO and abd xray on 4/19 showed partial SBO. Pharmacy has been consulted on 05/19/23 to start TPN for prolonged ileus.  Glucose / Insulin : no hx DM; - cbgs (goal <150) 135 - 181 - 6 units SSI in last 24 hrs. Daily Decadron  started 4/23 is likely causing elevation in CBGs. Electrolytes: all lytes WNL - Goal for ileus: Mag >2, K >4 Renal: Scr/BUN WNL Hepatic: albumin  low but improved; LFTs, alk phos, and Tbili WNL Intake / Output; MIVF: - NGT removed 4/21  - UOP: not recorded - Stool output: 350 mls (colostomy) - soft diet: 1080 mls (Esure plus  GI Imaging: - 4/17 abd CT: Findings concerning for early or intermittent small bowel obstruction with transition at the level of prior ileocolic anastomosis. Bilateral pleural effusions  - 4/19 abd xray: Findings consistent with a partial small bowel obstruction  - 4/23 abd xray: persistent small bowel dilation - 4/24 abd xray: persistent small bowel dilation stable from the previous study GI Surgeries / Procedures:   Central access: Port-a- cath  TPN start date: 05/19/23  Nutritional Goals: Goal TPN rate is 80 mL/hr (provides 99.8 g of protein and 1896 kcals per day)  RD Assessment:   Estimated Needs Total Energy Estimated Needs: 1750-2000 Total Protein Estimated Needs: 90-115 Total Fluid Estimated Needs: >1.75 L/day or per MD  Current Nutrition:  TPN, soft diet- 4/26 cramps w/ po intake today & vomiting x 1 Ensure plus ordered TID> took 1 on 6/25 pm, refused AM on 4/26   Plan:  At 1800:  - continue TPN @ 1/2 rate of 40 mL/hr    - Electrolytes in TPN:  Na 63mEq/L K 50mEq/L Ca 51mEq/L Mg 45mEq/L Phos 15mmol/L Cl:Ac 1:1 - Add standard MVI and trace elements to TPN - Continue sSSI q6h  - At minimum, monitor TPN labs on Mon/Thurs.  - F/u tolerance of soft diet and ability to wean off TPN   Rubie Corona, Pharm.D Use secure chat for questions 05/25/2023 10:02 AM

## 2023-05-26 ENCOUNTER — Inpatient Hospital Stay (HOSPITAL_COMMUNITY)

## 2023-05-26 DIAGNOSIS — R112 Nausea with vomiting, unspecified: Secondary | ICD-10-CM | POA: Diagnosis not present

## 2023-05-26 DIAGNOSIS — C482 Malignant neoplasm of peritoneum, unspecified: Secondary | ICD-10-CM | POA: Diagnosis not present

## 2023-05-26 DIAGNOSIS — K56600 Partial intestinal obstruction, unspecified as to cause: Secondary | ICD-10-CM | POA: Diagnosis not present

## 2023-05-26 LAB — CBC
HCT: 35.5 % — ABNORMAL LOW (ref 36.0–46.0)
Hemoglobin: 11.5 g/dL — ABNORMAL LOW (ref 12.0–15.0)
MCH: 31.3 pg (ref 26.0–34.0)
MCHC: 32.4 g/dL (ref 30.0–36.0)
MCV: 96.7 fL (ref 80.0–100.0)
Platelets: 314 10*3/uL (ref 150–400)
RBC: 3.67 MIL/uL — ABNORMAL LOW (ref 3.87–5.11)
RDW: 17.2 % — ABNORMAL HIGH (ref 11.5–15.5)
WBC: 8.7 10*3/uL (ref 4.0–10.5)
nRBC: 0 % (ref 0.0–0.2)

## 2023-05-26 LAB — BASIC METABOLIC PANEL WITH GFR
Anion gap: 12 (ref 5–15)
BUN: 19 mg/dL (ref 8–23)
CO2: 24 mmol/L (ref 22–32)
Calcium: 9.7 mg/dL (ref 8.9–10.3)
Chloride: 100 mmol/L (ref 98–111)
Creatinine, Ser: 0.53 mg/dL (ref 0.44–1.00)
GFR, Estimated: >60 mL/min (ref >60)
Glucose, Bld: 118 mg/dL — ABNORMAL HIGH (ref 70–99)
Potassium: 3.7 mmol/L (ref 3.5–5.1)
Sodium: 136 mmol/L (ref 135–145)

## 2023-05-26 LAB — GLUCOSE, CAPILLARY
Glucose-Capillary: 116 mg/dL — ABNORMAL HIGH (ref 70–99)
Glucose-Capillary: 130 mg/dL — ABNORMAL HIGH (ref 70–99)
Glucose-Capillary: 152 mg/dL — ABNORMAL HIGH (ref 70–99)
Glucose-Capillary: 196 mg/dL — ABNORMAL HIGH (ref 70–99)
Glucose-Capillary: 235 mg/dL — ABNORMAL HIGH (ref 70–99)

## 2023-05-26 MED ORDER — TRAVASOL 10 % IV SOLN
INTRAVENOUS | Status: AC
  Filled 2023-05-26: qty 998.4

## 2023-05-26 NOTE — Plan of Care (Signed)

## 2023-05-26 NOTE — Progress Notes (Signed)
 PHARMACY - TOTAL PARENTERAL NUTRITION CONSULT NOTE   Indication: Prolonged ileus  Patient Measurements: Height: 5\' 2"  (157.5 cm) Weight: 68.3 kg (150 lb 9.2 oz) IBW/kg (Calculated) : 50.1 TPN AdjBW (KG): 55.7 Body mass index is 27.54 kg/m. 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 Abdominal CT on 05/16/23 showed findings with concern for SBO and abd xray on 4/19 showed partial SBO. Pharmacy has been consulted on 05/19/23 to start TPN for prolonged ileus.  Glucose / Insulin : no hx DM; - cbgs (goal <150) 122 - 196 - 4 units SSI in last 24 hrs. Daily Decadron  started 4/23 is likely causing elevation in CBGs. Electrolytes: all lytes WNL on 4/26, no labs 4/27 - Goal for ileus: Mag >2, K >4 Renal: Scr/BUN WNL Hepatic: albumin  low but improved; LFTs, alk phos, and Tbili WNL Intake / Output; MIVF: - NGT removed 4/21  - UOP: not recorded - Stool output: 100 mls (colostomy) - 4/26 soft diet>> NPO  IVF: 1/2 NS @ 45 ml/hr per GYN GI Imaging: - 4/17 abd CT: Findings concerning for early or intermittent small bowel obstruction with transition at the level of prior ileocolic anastomosis. Bilateral pleural effusions  - 4/19 abd xray: Findings consistent with a partial small bowel obstruction  - 4/23 abd xray: persistent small bowel dilation - 4/24 abd xray: persistent small bowel dilation stable from the previous study 4/27 abd xray: ordered GI Surgeries / Procedures:   Central access: Port-a- cath  TPN start date: 05/19/23  Nutritional Goals: Goal TPN rate is 80 mL/hr (provides  99.8 g of protein and 1896 kcals per day)  RD Assessment:  Estimated Needs Total Energy Estimated Needs: 1750-2000 Total Protein Estimated Needs: 90-115 Total Fluid Estimated Needs: >1.75 L/day or per MD  Current Nutrition:  TPN 4/26 soft diet/ensure>> NPO  Plan:  At 1800:  -increase TPN from 1/2 rate of 40 mL/hr back to full rate of 80 ml/hr now that pt is NPO again    - Electrolytes in TPN:  Na 80mEq/L K 50mEq/L Ca 5mEq/L Mg 63mEq/L Phos 15mmol/L Cl:Ac 1:1 - Add standard MVI and trace elements to TPN - Continue sSSI q6h  -order for 4/5 NS @ 45 ml/hr has stop time of 1825  - At minimum, monitor TPN labs on Mon/Thurs.   Rubie Corona, Pharm.D Use secure chat for questions 05/26/2023 9:12 AM

## 2023-05-26 NOTE — Progress Notes (Signed)
 Gynecologic Oncology Progress Note   Subjective: No further vomiting/dry heaving  Objective: Vital signs in last 24 hours: Temp:  [97.7 F (36.5 C)-97.8 F (36.6 C)] 97.7 F (36.5 C) (04/27 0542) Pulse Rate:  [67-83] 67 (04/27 0542) Resp:  [17] 17 (04/27 0542) BP: (123-134)/(68-78) 123/68 (04/27 0542) SpO2:  [96 %] 96 % (04/27 0542) Weight:  [68.3 kg] 68.3 kg (04/27 0542) Last BM Date : 05/26/23  Intake/Output from previous day: 04/26 0701 - 04/27 0700 In: -  Out: 100 [Stool:100]  Physical Examination: General: alert, cooperative, and no distress Resp: clear to auscultation bilaterally Cardio: regular rate and rhythm, S1, S2 normal, no murmur, click, rub or gallop GI: hypoactive bowel sounds, abdomen tympanic, ostomy with small amount of gas and liquid stool, stoma non-edematous/stoma pink and viable Extremities: extremities normal, atraumatic, no cyanosis or edema Right chest port accessed with intact dressing with no surrounding erythema    Labs:    Latest Ref Rng & Units 05/18/2023    9:35 AM 05/17/2023   10:20 AM 05/17/2023    3:11 AM  CBC  WBC 4.0 - 10.5 K/uL 4.1  2.9  2.9   Hemoglobin 12.0 - 15.0 g/dL 16.1  09.6  04.5   Hematocrit 36.0 - 46.0 % 38.3  34.1  35.8   Platelets 150 - 400 K/uL 219  186  188       Latest Ref Rng & Units 05/25/2023    7:28 AM 05/24/2023    7:21 AM 05/23/2023    4:38 AM  BMP  Glucose 70 - 99 mg/dL 409  811  914   BUN 8 - 23 mg/dL 19  16  12    Creatinine 0.44 - 1.00 mg/dL 7.82  9.56  2.13   Sodium 135 - 145 mmol/L 136  134  135   Potassium 3.5 - 5.1 mmol/L 4.1  3.9  4.4   Chloride 98 - 111 mmol/L 101  104  103   CO2 22 - 32 mmol/L 26  21  23    Calcium  8.9 - 10.3 mg/dL 9.4  9.3  9.4      Assessment: 78 y.o. admitted with partial small bowel obstruction with transition at the level of prior ileocolic anastomosis. 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 Now s/p 4 cycles of adj C/T (last on 03/25/23). She was currently on Zejula  prior to admission.   Partial SBO: Gen Surg notes appreciated. Persistent nausea. Continue bowel rest/NPO, TPN and IV fluids for now.   Stoma prolapse: Stoma healthy--minimal prolapse when she remains recumbent.     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 Prophylactic lovenox  during hospital stay, can restart Eliquis  on discharge.    LOS: 10 days    Abdul Hodgkin, MD 05/26/2023, 10:34 AM

## 2023-05-26 NOTE — Progress Notes (Signed)
 Mobility Specialist - Progress Note   05/26/23 1422  Mobility  Activity Ambulated independently in hallway  Level of Assistance Independent  Assistive Device None  Distance Ambulated (ft) 700 ft  Activity Response Tolerated well  Mobility Referral Yes  Mobility visit 1 Mobility  Mobility Specialist Start Time (ACUTE ONLY) 1407  Mobility Specialist Stop Time (ACUTE ONLY) 1422  Mobility Specialist Time Calculation (min) (ACUTE ONLY) 15 min   Pt received in bed and agreeable to mobility. No complaints during session. Pt to bed after session with all needs met.    [facility]

## 2023-05-26 NOTE — Progress Notes (Signed)
   Subjective/Chief Complaint: Patient with 4 bouts of vomiting yesterday now NPO.  No further vomiting overnight.  Still with some colostomy output.   Objective: Vital signs in last 24 hours: Temp:  [97.7 F (36.5 C)-97.8 F (36.6 C)] 97.7 F (36.5 C) (04/27 0542) Pulse Rate:  [67-83] 67 (04/27 0542) Resp:  [17] 17 (04/27 0542) BP: (123-134)/(68-78) 123/68 (04/27 0542) SpO2:  [96 %] 96 % (04/27 0542) Weight:  [68.3 kg] 68.3 kg (04/27 0542) Last BM Date : 05/26/23  Intake/Output from previous day: 04/26 0701 - 04/27 0700 In: -  Out: 100 [Stool:100] Intake/Output this shift: No intake/output data recorded.  Soft nontender.  Ostomy functioning with contents in bag.  No rebound or guarding.  Scars noted.  Lab Results:  No results for input(s): "WBC", "HGB", "HCT", "PLT" in the last 72 hours. BMET Recent Labs    05/24/23 0721 05/25/23 0728  NA 134* 136  K 3.9 4.1  CL 104 101  CO2 21* 26  GLUCOSE 146* 153*  BUN 16 19  CREATININE 0.41* 0.56  CALCIUM  9.3 9.4   PT/INR No results for input(s): "LABPROT", "INR" in the last 72 hours. ABG No results for input(s): "PHART", "HCO3" in the last 72 hours.  Invalid input(s): "PCO2", "PO2"  Studies/Results: No results found.  Anti-infectives: Anti-infectives (From admission, onward)    None       Assessment/Plan: SBO Peritoneal carcinomatosis on chemotherapy History of ex lap with radical tumor debulking including total omentectomy, resection of peritoneal disease, SBR with reanastomosis, en bloc resection of the left fallopian tube and ovary as well as rectosigmoid colon, left ureterolysis, right salpingo-oophorectomy, end descending colostomy 11/2022 by Dr. Orvil Bland. Now s/p 4 cycles of adj C/T (last on 03/25/23). She was on Zejula  prior to admission.    - CT scan concerning for early or intermittent small bowel obstruction with transition at the level of the prior ileocolic anastomosis - Suspect partial SBO - Passed  contrast into colon 4/19  - Steroids per GYN ONC - Cont BID 34 g miralax  and BID Senna, stomal suppositories as well - Keep K >=4, Phos >= 3, Mg >= 2 and mobilize for bowel function -  Surgical intervention would be difficult and higher risk due to recent chemotherapy and peritoneal carcinomatosis.   FEN: Continue soft diet for now. Can wean TPN from our standpoint.  ID: None VTE: Please hold Eliquis .  Okay for heparin  drip if clinically indicated    Patient with nausea and vomiting yesterday.  She is now n.p.o. and that is stopped.  Recommend continuing n.p.o. for now.  Ostomy still functioning.  Concern for progressive mild obstruction from carcinomatosis and progression of cancer.  Discussed this with the patient today.  Recommend a multidisciplinary meeting between GYN  onc and, medical oncology and the patient to define goals of care.  Surgery available as needed otherwise this should be managed by the primary service.  Check KUB today.  May need NG tube to decompress her if she continues to vomit.  Discussed limited surgical options from a venting gastrostomy if her condition were to worsen or intestinal bypass which has a high complication rate in the setting.  This is all dependent on what treatment options she has available for his situation.   LOS: 10 days    Rodrigo Clara MD 05/26/2023 High complexity

## 2023-05-26 NOTE — Progress Notes (Signed)
 [facility]  PROGRESS NOTE  Rachel Carlson ZOX:[id number] DOB: Feb 04, 1945 DOA: 05/16/2023 PCP: Clemens Curt, MD   Brief HPI:   78 year old female with ovarian cancer and carcinomatosis s/p ex lap with tumor debulking, total omentectomy, SBR with 3 anastomosis, resection of ovary and rectosigmoid., DVT, GERD admitted last night for possible partial small bowel obstruction with probable transition point at the level of colonic anastomosis. Patient was started on conservative management with bowel rest via NG tube> underwent NG tube clamping trial and subsequently removed and diet is being advanced 4/22 night had a vomiting episode> x-ray abdomen with persistent small bowel dilatation on 4/20 3 AM    Assessment/Plan:   SBO-persistent Peritoneal carcinomatosis on chemo History of ex lap with radical tumor debulking surgery discharge various resections and colostomy 11/2022: CT concerning for early or intermittent SBO with transition at the level of prior ileocolic anastomosis, managing conservatively past contrast to the colon>s/p NG clamp trial and removed 4/21-on CLD and on TPN x-ray 4/24-similar persistent small bowel dilatation stable  On FLD- Diet-advanced to soft  4/25-but having nausea and vomiting, ostomy still functioning, per surgery concern for progressive mild obstruction from carcinomatosis and progression of cancer-for now continue soft diet May require NG tube decompression per [facility]-also advised goals of care discussion by GYN Continue current MiraLAX  bid senna bid and stomal suppository Continue holding Eliquis .  Restart Eliquis  on discharge as per GYN oncology Per [facility] patient with difficult and higher risks for surgical intervention Per GYN oncology Decadron  has been started 4/23 as suspecting it is malignant partial small bowel obstruction due to small volume and peritoneal disease.   Hypokalemia Hyponatremia: Resolved.   Painful swallowing Throat pain: Likely  in the setting of NG tube insertion/trauma-much improved following NG tube removal.     Hypertension: BP stable. PTA not taking metoprolol -discontinued.     Medications     vitamin C   1,000 mg Oral Daily   Chlorhexidine  Gluconate Cloth  6 each Topical Daily   dexamethasone  (DECADRON ) injection  6 mg Intravenous Q24H   enoxaparin  (LOVENOX ) injection  40 mg Subcutaneous Q24H   feeding supplement  237 mL Oral TID BM   insulin  aspart  0-9 Units Subcutaneous Q6H   metoprolol  tartrate  5 mg Intravenous Q12H   pantoprazole   40 mg Oral Daily   polyethylene glycol  34 g Oral BID   rosuvastatin   20 mg Oral QHS   senna  1 tablet Oral BID     Data Reviewed:   CBG:  Recent Labs  Lab 05/25/23 0523 05/25/23 1204 05/25/23 1704 05/26/23 0000 05/26/23 0538  GLUCAP 135* 131* 122* 196* 152*    SpO2: 96 %    Vitals:   05/25/23 0528 05/25/23 1948 05/25/23 2200 05/26/23 0542  BP: (!) 122/52 130/69 134/78 123/68  Pulse: 68 75 83 67  Resp: 17 17  17   Temp: 97.8 F (36.6 C) 97.8 F (36.6 C)  97.7 F (36.5 C)  TempSrc: Oral Oral  Oral  SpO2: 97% 96%  96%  Weight:    68.3 kg  Height:          Data Reviewed:  Basic Metabolic Panel: Recent Labs  Lab 05/21/23 0436 05/22/23 0451 05/23/23 0438 05/24/23 0721 05/25/23 0728  NA 132* 137 135 134* 136  K 3.3* 4.3 4.4 3.9 4.1  CL 102 104 103 104 101  CO2 24 25 23  21* 26  GLUCOSE 154* 148* 193* 146* 153*  BUN 9 10 12 16  19  CREATININE 0.44 0.47 0.50 0.41* 0.56  CALCIUM  8.2* 9.0 9.4 9.3 9.4  MG 2.1 2.0 2.0 2.0 2.0  PHOS 3.1 3.8 3.5 3.0 4.0    CBC: No results for input(s): "WBC", "NEUTROABS", "HGB", "HCT", "MCV", "PLT" in the last 168 hours.  LFT Recent Labs  Lab 05/20/23 0424 05/21/23 0436 05/22/23 0451 05/23/23 0438 05/24/23 0721  AST 12* 11* 12* 15 14*  ALT 9 8 9 10 11   ALKPHOS 51 51 56 61 61  BILITOT 0.9 0.6 0.5 0.7 0.3  PROT 5.6* 5.4* 5.9* 6.4* 6.5  ALBUMIN  2.7* 2.6* 2.8* 3.1* 3.0*      Antibiotics: Anti-infectives (From admission, onward)    None        DVT prophylaxis: Lovenox   Code Status: Full code  Family Communication: No family at bedside   CONSULTS    Subjective   Denies pain.   Objective    Physical Examination:   General-appears in no acute distress Heart-S1-S2, regular, no murmur auscultated Lungs-clear to auscultation bilaterally, no wheezing or crackles auscultated Abdomen-soft, nontender, no organomegaly, colostomy in place Extremities-no edema in the lower extremities Neuro-alert, oriented x3, no focal deficit noted  Status is: Inpatient:             Rachel Carlson   [facility] If 7PM-7AM, please contact night-coverage at [email or web address], Office  [phone]   05/26/2023, 8:03 AM  LOS: 10 days

## 2023-05-27 ENCOUNTER — Telehealth: Payer: Self-pay | Admitting: Oncology

## 2023-05-27 DIAGNOSIS — Y833 Surgical operation with formation of external stoma as the cause of abnormal reaction of the patient, or of later complication, without mention of misadventure at the time of the procedure: Secondary | ICD-10-CM | POA: Diagnosis not present

## 2023-05-27 DIAGNOSIS — K56600 Partial intestinal obstruction, unspecified as to cause: Secondary | ICD-10-CM | POA: Diagnosis not present

## 2023-05-27 DIAGNOSIS — R112 Nausea with vomiting, unspecified: Secondary | ICD-10-CM | POA: Diagnosis not present

## 2023-05-27 DIAGNOSIS — C482 Malignant neoplasm of peritoneum, unspecified: Secondary | ICD-10-CM | POA: Diagnosis not present

## 2023-05-27 LAB — CBC
HCT: 34.1 % — ABNORMAL LOW (ref 36.0–46.0)
Hemoglobin: 11 g/dL — ABNORMAL LOW (ref 12.0–15.0)
MCH: 31.7 pg (ref 26.0–34.0)
MCHC: 32.3 g/dL (ref 30.0–36.0)
MCV: 98.3 fL (ref 80.0–100.0)
Platelets: 310 10*3/uL (ref 150–400)
RBC: 3.47 MIL/uL — ABNORMAL LOW (ref 3.87–5.11)
RDW: 17.1 % — ABNORMAL HIGH (ref 11.5–15.5)
WBC: 7.9 10*3/uL (ref 4.0–10.5)
nRBC: 0 % (ref 0.0–0.2)

## 2023-05-27 LAB — COMPREHENSIVE METABOLIC PANEL WITH GFR
ALT: 14 U/L (ref 0–44)
AST: 15 U/L (ref 15–41)
Albumin: 3.2 g/dL — ABNORMAL LOW (ref 3.5–5.0)
Alkaline Phosphatase: 59 U/L (ref 38–126)
Anion gap: 9 (ref 5–15)
BUN: 20 mg/dL (ref 8–23)
CO2: 23 mmol/L (ref 22–32)
Calcium: 9.2 mg/dL (ref 8.9–10.3)
Chloride: 101 mmol/L (ref 98–111)
Creatinine, Ser: 0.55 mg/dL (ref 0.44–1.00)
GFR, Estimated: >60 mL/min (ref >60)
Glucose, Bld: 174 mg/dL — ABNORMAL HIGH (ref 70–99)
Potassium: 4 mmol/L (ref 3.5–5.1)
Sodium: 133 mmol/L — ABNORMAL LOW (ref 135–145)
Total Bilirubin: 0.4 mg/dL (ref 0.0–1.2)
Total Protein: 6.5 g/dL (ref 6.5–8.1)

## 2023-05-27 LAB — GLUCOSE, CAPILLARY
Glucose-Capillary: 171 mg/dL — ABNORMAL HIGH (ref 70–99)
Glucose-Capillary: 183 mg/dL — ABNORMAL HIGH (ref 70–99)
Glucose-Capillary: 215 mg/dL — ABNORMAL HIGH (ref 70–99)
Glucose-Capillary: 309 mg/dL — ABNORMAL HIGH (ref 70–99)

## 2023-05-27 LAB — TRIGLYCERIDES: Triglycerides: 179 mg/dL — ABNORMAL HIGH (ref <150)

## 2023-05-27 LAB — PHOSPHORUS: Phosphorus: 3.6 mg/dL (ref 2.5–4.6)

## 2023-05-27 LAB — MAGNESIUM: Magnesium: 2.2 mg/dL (ref 1.7–2.4)

## 2023-05-27 MED ORDER — TRAVASOL 10 % IV SOLN
INTRAVENOUS | Status: AC
Start: 2023-05-27 — End: 2023-05-28
  Filled 2023-05-27: qty 998.4

## 2023-05-27 NOTE — Progress Notes (Signed)
 Gynecologic Oncology Progress Note   Subjective: Patient reports having severe cramping on Friday evening followed by emesis and dry heaving. She has not had nausea or emesis since that time. She has been sipping on a small amount of water . Reports 450 cc of output from her ostomy from Friday to Saturday and minimal after that. No pain reported this am. Continuing to ambulate without difficulty. Voiding. Venting gastrostomy tube for palliation discussed with patient by Gen Surg Team. Patient stating she is open to whatever the team thinks is best. She became tearful at the end of the visit saying she will need to get things in order at home and will have to arrange care for her husband.    Objective: Vital signs in last 24 hours: Temp:  [97.6 F (36.4 C)-97.9 F (36.6 C)] 97.6 F (36.4 C) (04/28 0546) Pulse Rate:  [63-72] 63 (04/28 0546) Resp:  [16-17] 16 (04/28 0546) BP: (114-125)/(59-65) 114/59 (04/28 0546) SpO2:  [96 %-97 %] 97 % (04/28 0546) Weight:  [149 lb 3.2 oz (67.7 kg)] 149 lb 3.2 oz (67.7 kg) (04/28 0002) Last BM Date : 05/26/23  Intake/Output from previous day: 04/27 0701 - 04/28 0700 In: 1892.7 [I.V.:1892.7] Out: 0   Physical Examination: General: alert, cooperative, and no distress Resp: clear to auscultation bilaterally Cardio: regular rate and rhythm, S1, S2 normal, no murmur, click, rub or gallop GI: hypoactive bowel sounds, abdomen tympanic, slightly less distended in the upper abd compared to assessment on Friday, ostomy with small amount of gas and liquid stool, stoma non-edematous/stoma pink and viable Extremities: extremities normal, atraumatic, no cyanosis or edema Right chest port accessed with intact dressing with no surrounding erythema    Labs:    Latest Ref Rng & Units 05/27/2023    5:57 AM 05/26/2023   11:38 AM 05/18/2023    9:35 AM  CBC  WBC 4.0 - 10.5 K/uL 7.9  8.7  4.1   Hemoglobin 12.0 - 15.0 g/dL 16.1  09.6  04.5   Hematocrit 36.0 - 46.0 % 34.1   35.5  38.3   Platelets 150 - 400 K/uL 310  314  219       Latest Ref Rng & Units 05/27/2023    5:57 AM 05/26/2023   11:38 AM 05/25/2023    7:28 AM  BMP  Glucose 70 - 99 mg/dL 409  811  914   BUN 8 - 23 mg/dL 20  19  19    Creatinine 0.44 - 1.00 mg/dL 7.82  9.56  2.13   Sodium 135 - 145 mmol/L 133  136  136   Potassium 3.5 - 5.1 mmol/L 4.0  3.7  4.1   Chloride 98 - 111 mmol/L 101  100  101   CO2 22 - 32 mmol/L 23  24  26    Calcium  8.9 - 10.3 mg/dL 9.2  9.7  9.4    0/86/5784 KUB: Improvement in the degree of small-bowel dilatation.   Assessment: 78 y.o. admitted with partial small bowel obstruction with transition at the level of prior ileocolic anastomosis. 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 Now s/p 4 cycles of adj C/T (last on 03/25/23). She was currently on Zejula  prior to admission.   Partial SBO: Gen Surg input appreciated. Discussed possible advancement to clear liquids with Gen Surg Team. TPN and IV fluids for now. Possibility of G tube placement  discussed by Gen Surg. Per Dr. Orvil Bland, plan for dietician to come and discuss G tube diet with patient to be well informed if decision is made for placement in the future. Not a surgical candidate for major surgery per GYN ONC Team at this time. Also can consider Palliative Care consult for goals of care, symptom management discussion.  Stoma prolapse: Stoma healthy--minimal prolapse when she remains recumbent.   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 Prophylactic lovenox  during hospital stay, can restart Eliquis  on discharge.    LOS: 11 days    Rachel Emory, MD 05/27/2023, 8:46 AM

## 2023-05-27 NOTE — Telephone Encounter (Signed)
 Called [facility] and had appointment with Dr. Camilo Cella rescheduled to 07/01/23 a 11:20 with 10:50 arrival due to patient being in the hospital.  Also left a message with Dr. Sunnie England office to see if 05/30/23 procedure can be rescheduled.

## 2023-05-27 NOTE — Telephone Encounter (Signed)
 Amy with Uro Gyn called back and procedure has been rescheduled to 06/13/23 at 10:00.

## 2023-05-27 NOTE — Progress Notes (Signed)
 PHARMACY - TOTAL PARENTERAL NUTRITION CONSULT NOTE   Indication: Prolonged ileus  Patient Measurements: Height: 5\' 2"  (157.5 cm) Weight: 67.7 kg (149 lb 3.2 oz) IBW/kg (Calculated) : 50.1 TPN AdjBW (KG): 55.7 Body mass index is 27.29 kg/m. 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 Abdominal CT on 05/16/23 showed findings with concern for SBO and abd xray on 4/19 showed partial SBO. Pharmacy has been consulted on 05/19/23 to start TPN for prolonged ileus.  Glucose / Insulin : no hx DM; - cbgs (goal <150) 116 - 235 - 7 units SSI in last 24 hrs. Daily Decadron  started 4/23 is likely causing elevation in CBGs. Electrolytes: Na slightly low at 133, all other lytes WNL including CoCa at 9.5 - Goal for ileus: K >=4, Phos >= 3, Mg >= 2  Renal: Scr/BUN WNL Hepatic: albumin  low but improved; LFTs, alk phos, and Tbili WNL - TGs slightly elevated at 179 (4/28) Intake / Output; MIVF: - NGT removed 4/21  - UOP: 3x - Stool output: not recorded - 4/26 soft diet >> NPO  - IVF: 1/2 NS @ 45 ml/hr per GYN (order discontinued 4/27 @1844 ) GI Imaging: - 4/17 abd CT: Findings concerning for early or intermittent small bowel obstruction with transition at the level of prior ileocolic anastomosis. Bilateral pleural effusions  - 4/19 abd xray: Findings consistent with a partial small bowel obstruction  - 4/23 abd xray: persistent small bowel dilation - 4/24 abd xray: persistent small bowel dilation stable from the previous study 4/27 abd xray: improvement in the degree of small-bowel dilation GI  Surgeries / Procedures:   Central access: Port-a- cath  TPN start date: 05/19/23  Nutritional Goals: Goal TPN rate is 80 mL/hr (provides 99.8 g of protein and 1896 kcals per day)  RD Assessment:  Estimated Needs Total Energy Estimated Needs: 1750-2000 Total Protein Estimated Needs: 90-115 Total Fluid Estimated Needs: >1.75 L/day or per MD  Current Nutrition:  TPN 4/26 soft diet/ensure>> NPO 4/28 possibly transition to CLD if no procedures planned  Plan:  At 1800:  -Continue TPN at goal rate of 80 mL/hr    - Electrolytes in TPN:  Na 100mEq/L - increased K 50mEq/L Ca 5mEq/L Mg 43mEq/L Phos 15mmol/L Cl:Ac 1:1 - Add standard MVI and trace elements to TPN - Continue sSSI q6h  - At minimum, monitor TPN labs on Mon/Thurs - F/u plans for possible venting gastrostomy tube placement   Roselee Cong, PharmD Clinical Pharmacist  4/28/20259:31 AM

## 2023-05-27 NOTE — Progress Notes (Signed)
 Mobility Specialist - Progress Note   05/27/23 1002  Mobility  Activity Ambulated independently in hallway  Level of Assistance Independent  Assistive Device None  Distance Ambulated (ft) 1400 ft  Range of Motion/Exercises Active  Activity Response Tolerated well  Mobility Referral Yes  Mobility visit 1 Mobility  Mobility Specialist Start Time (ACUTE ONLY) 0945  Mobility Specialist Stop Time (ACUTE ONLY) 1002  Mobility Specialist Time Calculation (min) (ACUTE ONLY) 17 min   Pt was found in bed and agreeable to ambulate. No complaints with session and returned to bed with all needs met. Call bell in reach and RN in room.  Lorna Rose Mobility Specialist

## 2023-05-27 NOTE — Plan of Care (Signed)

## 2023-05-27 NOTE — Progress Notes (Addendum)
 [facility]  PROGRESS NOTE  Rachel Carlson:[id number] DOB: 1945-03-21 DOA: 05/16/2023 PCP: Clemens Curt, MD   Brief HPI:   78 year old female with ovarian cancer and carcinomatosis s/p ex lap with tumor debulking, total omentectomy, SBR with 3 anastomosis, resection of ovary and rectosigmoid., DVT, GERD admitted last night for possible partial small bowel obstruction with probable transition point at the level of colonic anastomosis. Patient was started on conservative management with bowel rest via NG tube> underwent NG tube clamping trial and subsequently removed and diet is being advanced 4/22 night had a vomiting episode> x-ray abdomen with persistent small bowel dilatation on 4/20 3 AM    Assessment/Plan:   SBO-persistent Peritoneal carcinomatosis on chemo History of ex lap with radical tumor debulking surgery discharge various resections and colostomy 11/2022: CT concerning for early or intermittent SBO with transition at the level of prior ileocolic anastomosis, managing conservatively past contrast to the colon>s/p NG clamp trial and removed 4/21-on CLD and on TPN x-ray 4/24-similar persistent small bowel dilatation stable  On FLD- Diet-advanced to soft  4/25-but having nausea and vomiting, ostomy still functioning, per surgery concern for progressive mild obstruction from carcinomatosis and progression of cancer-for now continue soft diet May require NG tube decompression per CCS-also advised goals of care discussion by GYN Continue current MiraLAX  bid senna bid and stomal suppository Continue holding Eliquis .  Restart Eliquis  on discharge as per GYN oncology Per CCS patient with difficult and higher risks for surgical intervention Per GYN oncology Decadron  has been started 4/23 as suspecting it is malignant partial small bowel obstruction due to small volume and peritoneal disease. -General Surgery is recommending venting gastrostomy, GYN oncology also recommended  palliative care consult. -Will get palliative care consult for goals of care discussion   Hypokalemia Hyponatremia: Resolved.   Painful swallowing Throat pain: Likely in the setting of NG tube insertion/trauma-much improved following NG tube removal.     Hypertension: BP stable. PTA not taking metoprolol -discontinued.     Medications     vitamin C   1,000 mg Oral Daily   Chlorhexidine  Gluconate Cloth  6 each Topical Daily   dexamethasone  (DECADRON ) injection  6 mg Intravenous Q24H   enoxaparin  (LOVENOX ) injection  40 mg Subcutaneous Q24H   feeding supplement  237 mL Oral TID BM   insulin  aspart  0-9 Units Subcutaneous Q6H   metoprolol  tartrate  5 mg Intravenous Q12H   pantoprazole   40 mg Oral Daily   polyethylene glycol  34 g Oral BID   rosuvastatin   20 mg Oral QHS   senna  1 tablet Oral BID     Data Reviewed:   CBG:  Recent Labs  Lab 05/26/23 0538 05/26/23 1226 05/26/23 1740 05/26/23 2358 05/27/23 0550  GLUCAP 152* 116* 130* 235* 183*    SpO2: 97 %    Vitals:   05/26/23 0542 05/26/23 2007 05/27/23 0002 05/27/23 0546  BP: 123/68 125/65  (!) 114/59  Pulse: 67 72  63  Resp: 17 17  16   Temp: 97.7 F (36.5 C) 97.9 F (36.6 C)  97.6 F (36.4 C)  TempSrc: Oral Oral  Oral  SpO2: 96% 96%  97%  Weight: 68.3 kg  67.7 kg   Height:          Data Reviewed:  Basic Metabolic Panel: Recent Labs  Lab 05/22/23 0451 05/23/23 0438 05/24/23 0721 05/25/23 0728 05/26/23 1138 05/27/23 0557  NA 137 135 134* 136 136 133*  K 4.3 4.4 3.9 4.1 3.7 4.0  CL 104 103 104 101 100 101  CO2 25 23 21* 26 24 23   GLUCOSE 148* 193* 146* 153* 118* 174*  BUN 10 12 16 19 19 20   CREATININE 0.47 0.50 0.41* 0.56 0.53 0.55  CALCIUM  9.0 9.4 9.3 9.4 9.7 9.2  MG 2.0 2.0 2.0 2.0  --  2.2  PHOS 3.8 3.5 3.0 4.0  --  3.6    CBC: Recent Labs  Lab 05/26/23 1138 05/27/23 0557  WBC 8.7 7.9  HGB 11.5* 11.0*  HCT 35.5* 34.1*  MCV 96.7 98.3  PLT 314 310    LFT Recent Labs  Lab  05/21/23 0436 05/22/23 0451 05/23/23 0438 05/24/23 0721 05/27/23 0557  AST 11* 12* 15 14* 15  ALT 8 9 10 11 14   ALKPHOS 51 56 61 61 59  BILITOT 0.6 0.5 0.7 0.3 0.4  PROT 5.4* 5.9* 6.4* 6.5 6.5  ALBUMIN  2.6* 2.8* 3.1* 3.0* 3.2*     Antibiotics: Anti-infectives (From admission, onward)    None        DVT prophylaxis: Lovenox   Code Status: Full code  Family Communication: No family at bedside   CONSULTS    Subjective   Denies any complaints   Objective    Physical Examination:  General-appears in no acute distress Heart-S1-S2, regular, no murmur auscultated Lungs-clear to auscultation bilaterally, no wheezing or crackles auscultated Abdomen-soft, nontender, no organomegaly, colostomy bag in place Extremities-no edema in the lower extremities Neuro-alert, oriented x3, no focal deficit noted  Status is: Inpatient:             Rachel Carlson   [facility] If 7PM-7AM, please contact night-coverage at [email or web address], Office  [phone]   05/27/2023, 8:17 AM  LOS: 11 days

## 2023-05-27 NOTE — Progress Notes (Addendum)
 Subjective/Chief Complaint: Denies pain at present. Denies vomiting since Friday but states she is not really drinking, therefore she is not vomiting. Having some gas via ostomy but reports no stool last 24h. Had about 450 of stool between Friday and Saturday.   When I ask her how she feels about a venting gastrostomy tube for palliation she says she would defer to us /our recommendation for having a G tube placed.    Objective: Vital signs in last 24 hours: Temp:  [97.6 F (36.4 C)-97.9 F (36.6 C)] 97.6 F (36.4 C) (04/28 0546) Pulse Rate:  [63-72] 63 (04/28 0546) Resp:  [16-17] 16 (04/28 0546) BP: (114-125)/(59-65) 114/59 (04/28 0546) SpO2:  [96 %-97 %] 97 % (04/28 0546) Weight:  [67.7 kg] 67.7 kg (04/28 0002) Last BM Date : 05/26/23  Intake/Output from previous day: 04/27 0701 - 04/28 0700 In: 1892.7 [I.V.:1892.7] Out: 0  Intake/Output this shift: No intake/output data recorded.  Soft nontender.  Stoma viable, mild prolapse, gas in ostomy appliance, No rebound or guarding.  Scars noted.  Lab Results:  Recent Labs    05/26/23 1138 05/27/23 0557  WBC 8.7 7.9  HGB 11.5* 11.0*  HCT 35.5* 34.1*  PLT 314 310   BMET Recent Labs    05/26/23 1138 05/27/23 0557  NA 136 133*  K 3.7 4.0  CL 100 101  CO2 24 23  GLUCOSE 118* 174*  BUN 19 20  CREATININE 0.53 0.55  CALCIUM  9.7 9.2   PT/INR No results for input(s): "LABPROT", "INR" in the last 72 hours. ABG No results for input(s): "PHART", "HCO3" in the last 72 hours.  Invalid input(s): "PCO2", "PO2"  Studies/Results: DG Abd 1 View Result Date: 05/26/2023 CLINICAL DATA:  Recent vomiting EXAM: ABDOMEN - 1 VIEW COMPARISON:  05/23/2023 FINDINGS: Scattered large and small bowel gas is noted. Decreased small bowel dilatation is noted when compare with the prior exam. A single mildly dilated loop of small bowel is seen. No free air is noted. No bony abnormality is noted. IMPRESSION: Improvement in the degree of  small-bowel dilatation. Electronically Signed   By: Violeta Grey M.D.   On: 05/26/2023 10:48    Anti-infectives: Anti-infectives (From admission, onward)    None       Assessment/Plan: SBO Peritoneal carcinomatosis on chemotherapy History of ex lap with radical tumor debulking including total omentectomy, resection of peritoneal disease, SBR with reanastomosis, en bloc resection of the left fallopian tube and ovary as well as rectosigmoid colon, left ureterolysis, right salpingo-oophorectomy, end descending colostomy 11/2022 by Dr. Orvil Bland. Now s/p 4 cycles of adj C/T (last on 03/25/23). She was on Zejula  prior to admission.    - CT scan concerning for early or intermittent small bowel obstruction with transition at the level of the prior ileocolic anastomosis - Suspect partial SBO - Passed contrast into colon 4/19  - Steroids per GYN ONC - Cont BID 34 g miralax  and BID Senna, stomal suppositories as well - Keep K >=4, Phos >= 3, Mg >= 2 and mobilize for bowel function -  Surgical intervention would be difficult and higher risk due to recent chemotherapy and peritoneal carcinomatosis.   FEN: NPO, IVF, TPN; ok for CLD today if no procedures planned. ID: None VTE: Please hold Eliquis .  Okay for heparin  drip if clinically indicated    Agree with Dr. Jodie Munson assessment over the weekend - Concern for progressive mild obstruction from carcinomatosis and progression of cancer.  Recommend a multidisciplinary meeting between GYN  onc,  medical oncology, palliative care, and the patient to define goals of care.  Discussed limited surgical options from a venting gastrostomy if her condition were to worsen or intestinal bypass which has a high complication rate in the setting.  This is all dependent on what treatment options she has available for his situation. If venting gastrostomy tube is desired then I would recommend an IR consult to see if they can place one percutaneously, this would be the  least invasive way. On her CT there does look like theres a small window for a G tube but her transverse colon also overlies a portion of the stomach   LOS: 11 days    Charlott Converse PA-C 05/27/2023 High complexity

## 2023-05-28 DIAGNOSIS — K56600 Partial intestinal obstruction, unspecified as to cause: Secondary | ICD-10-CM | POA: Diagnosis not present

## 2023-05-28 DIAGNOSIS — C482 Malignant neoplasm of peritoneum, unspecified: Secondary | ICD-10-CM | POA: Diagnosis not present

## 2023-05-28 DIAGNOSIS — R112 Nausea with vomiting, unspecified: Secondary | ICD-10-CM | POA: Diagnosis not present

## 2023-05-28 LAB — BASIC METABOLIC PANEL WITH GFR
Anion gap: 7 (ref 5–15)
BUN: 20 mg/dL (ref 8–23)
CO2: 22 mmol/L (ref 22–32)
Calcium: 9.2 mg/dL (ref 8.9–10.3)
Chloride: 105 mmol/L (ref 98–111)
Creatinine, Ser: 0.51 mg/dL (ref 0.44–1.00)
GFR, Estimated: >60 mL/min (ref >60)
Glucose, Bld: 172 mg/dL — ABNORMAL HIGH (ref 70–99)
Potassium: 4.1 mmol/L (ref 3.5–5.1)
Sodium: 134 mmol/L — ABNORMAL LOW (ref 135–145)

## 2023-05-28 LAB — CBC
HCT: 33.1 % — ABNORMAL LOW (ref 36.0–46.0)
Hemoglobin: 10.7 g/dL — ABNORMAL LOW (ref 12.0–15.0)
MCH: 31.8 pg (ref 26.0–34.0)
MCHC: 32.3 g/dL (ref 30.0–36.0)
MCV: 98.2 fL (ref 80.0–100.0)
Platelets: 296 10*3/uL (ref 150–400)
RBC: 3.37 MIL/uL — ABNORMAL LOW (ref 3.87–5.11)
RDW: 16.9 % — ABNORMAL HIGH (ref 11.5–15.5)
WBC: 8.2 10*3/uL (ref 4.0–10.5)
nRBC: 0 % (ref 0.0–0.2)

## 2023-05-28 LAB — GLUCOSE, CAPILLARY
Glucose-Capillary: 140 mg/dL — ABNORMAL HIGH (ref 70–99)
Glucose-Capillary: 161 mg/dL — ABNORMAL HIGH (ref 70–99)
Glucose-Capillary: 176 mg/dL — ABNORMAL HIGH (ref 70–99)
Glucose-Capillary: 207 mg/dL — ABNORMAL HIGH (ref 70–99)
Glucose-Capillary: 208 mg/dL — ABNORMAL HIGH (ref 70–99)

## 2023-05-28 MED ORDER — INSULIN ASPART 100 UNIT/ML IJ SOLN
0.0000 [IU] | INTRAMUSCULAR | Status: DC
  Administered 2023-05-28: 1 [IU] via SUBCUTANEOUS
  Administered 2023-05-28: 2 [IU] via SUBCUTANEOUS
  Administered 2023-05-28: 3 [IU] via SUBCUTANEOUS
  Administered 2023-05-29: 1 [IU] via SUBCUTANEOUS
  Administered 2023-05-29: 2 [IU] via SUBCUTANEOUS
  Administered 2023-05-29: 3 [IU] via SUBCUTANEOUS
  Administered 2023-05-29: 2 [IU] via SUBCUTANEOUS
  Administered 2023-05-29 – 2023-05-30 (×2): 3 [IU] via SUBCUTANEOUS
  Administered 2023-05-30 (×2): 2 [IU] via SUBCUTANEOUS
  Administered 2023-05-30: 1 [IU] via SUBCUTANEOUS
  Administered 2023-05-30: 3 [IU] via SUBCUTANEOUS
  Administered 2023-05-30 – 2023-05-31 (×4): 1 [IU] via SUBCUTANEOUS

## 2023-05-28 MED ORDER — TRAVASOL 10 % IV SOLN
INTRAVENOUS | Status: AC
  Filled 2023-05-28: qty 998.4

## 2023-05-28 NOTE — Progress Notes (Signed)
 Gynecologic Oncology Progress Note   Subjective: Patient reports doing well this am. No nausea or emesis. Tolerated sips of clears. No significant abdominal cramping reported and states for mild discomfort in the upper abdomen, she lightly massages the area and things improve. Increase in ostomy output today. Voiding without difficulty. Ambulating in the halls.   Objective: Vital signs in last 24 hours: Temp:  [97.6 F (36.4 C)-98.3 F (36.8 C)] 98.3 F (36.8 C) (04/29 0530) Pulse Rate:  [65-85] 65 (04/29 0530) Resp:  [14-18] 17 (04/29 0530) BP: (112-122)/(65-74) 119/69 (04/29 0530) SpO2:  [95 %-99 %] 95 % (04/29 0530) Last BM Date : 05/28/23  Intake/Output from previous day: 04/28 0701 - 04/29 0700 In: 2081.5 [P.O.:240; I.V.:1841.5] Out: 150 [Stool:150]  Physical Examination: General: alert, cooperative, and no distress Resp: clear to auscultation bilaterally Cardio: regular rate and rhythm, S1, S2 normal, no murmur, click, rub or gallop GI: more active bowel sounds, abdomen mildly tympanic (less than 4/28 assessment), slightly less distended in the upper abd compared to prev assessment, ostomy with moderate gas and liquid stool, stoma non-edematous/stoma pink and viable Extremities: extremities normal, atraumatic, no cyanosis or edema Right chest port accessed with intact dressing with no surrounding erythema   Labs:    Latest Ref Rng & Units 05/28/2023    4:15 AM 05/27/2023    5:57 AM 05/26/2023   11:38 AM  CBC  WBC 4.0 - 10.5 K/uL 8.2  7.9  8.7   Hemoglobin 12.0 - 15.0 g/dL 16.1  09.6  04.5   Hematocrit 36.0 - 46.0 % 33.1  34.1  35.5   Platelets 150 - 400 K/uL 296  310  314       Latest Ref Rng & Units 05/28/2023    4:15 AM 05/27/2023    5:57 AM 05/26/2023   11:38 AM  BMP  Glucose 70 - 99 mg/dL 409  811  914   BUN 8 - 23 mg/dL 20  20  19    Creatinine 0.44 - 1.00 mg/dL 7.82  9.56  2.13   Sodium 135 - 145 mmol/L 134  133  136   Potassium 3.5 - 5.1 mmol/L 4.1  4.0  3.7    Chloride 98 - 111 mmol/L 105  101  100   CO2 22 - 32 mmol/L 22  23  24    Calcium  8.9 - 10.3 mg/dL 9.2  9.2  9.7    0/86/5784 KUB: Improvement in the degree of small-bowel dilatation.   Assessment: 78 y.o. admitted with partial small bowel obstruction with transition at the level of prior ileocolic anastomosis. 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 Now s/p 4 cycles of adj C/T (last on 03/25/23). She was currently on Zejula  prior to admission.   Partial SBO: Gen Surg input appreciated. Plan for advancement to full liquid diet per Gen Surg Team. TPN and IV fluids for now. Possibility of G tube placement previously discussed, will be revisited by Dr. Orvil Bland tomorrow. Not a surgical candidate for major surgery per GYN ONC Team at this time.   Stoma prolapse: Stoma healthy--minimal prolapse when she remains recumbent.   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 Prophylactic lovenox  during hospital stay, can restart Eliquis  on discharge.    LOS: 12 days    Suellyn Emory, MD 05/28/2023, 9:26 AM

## 2023-05-28 NOTE — Progress Notes (Signed)
 PHARMACY - TOTAL PARENTERAL NUTRITION CONSULT NOTE   Indication: Prolonged ileus  Patient Measurements: Height: 5\' 2"  (157.5 cm) Weight: 67.7 kg (149 lb 3.2 oz) IBW/kg (Calculated) : 50.1 TPN AdjBW (KG): 55.7 Body mass index is 27.29 kg/m. 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 Abdominal CT on 05/16/23 showed findings with concern for SBO and abd xray on 4/19 showed partial SBO. Pharmacy has been consulted on 05/19/23 to start TPN for prolonged ileus.  Glucose / Insulin : no hx DM; - cbgs (goal <150) 171 - 309 - 14 units SSI in last 24 hrs. Daily Decadron  started 4/23 is likely causing elevation in CBGs. Electrolytes: Na slightly low at 134, all other lytes WNL including CoCa at 9.5 - Goal for ileus: K >=4, Phos >= 3, Mg >= 2  Renal: Scr/BUN WNL Hepatic: albumin  low but improved; LFTs, alk phos, and Tbili WNL (from 4/28 labs) - TGs slightly elevated at 179 (4/28) Intake / Output; MIVF: - NGT removed 4/21  - UOP: 1x - Stool output: 150 mL/24 hr - PO (CLD): 240 mL/24 hr   >Pt has had good PO intake for the last 24hrs and reports no N/V to surgery.  GI Imaging: - 4/17 abd CT: Findings concerning for early or intermittent small bowel obstruction with transition at the level of prior ileocolic anastomosis. Bilateral pleural effusions  - 4/19 abd xray: Findings consistent with a partial small bowel obstruction  - 4/23 abd xray: persistent small bowel dilation - 4/24 abd xray: persistent small bowel dilation stable from the previous study 4/27 abd xray: improvement in the  degree of small-bowel dilation GI Surgeries / Procedures:   Central access: Port-a- cath  TPN start date: 05/19/23  Nutritional Goals: Goal TPN rate is 80 mL/hr (provides 99.8 g of protein and 1896 kcals per day)  RD Assessment:  Estimated Needs Total Energy Estimated Needs: 1750-2000 Total Protein Estimated Needs: 90-115 Total Fluid Estimated Needs: >1.75 L/day or per MD  Current Nutrition:  TPN CLD >> FLD on 4/29  Plan:  At 1800:  -Continue TPN at goal rate of 80 mL/hr    - Electrolytes in TPN:  Na 120 mEq/L - increased K 50mEq/L Ca 5mEq/L Mg 30mEq/L Phos 15mmol/L Cl:Ac 1:1 - Add standard MVI and trace elements to TPN - Increase to mSSI q4h  - At minimum, monitor TPN labs on Mon/Thurs - F/u half-rate TPN tomorrow if patient tolerates FLD today and progresses to soft diet tomorrow. - F/u plans for possible venting gastrostomy tube placement   Roselee Cong, PharmD Clinical Pharmacist  4/29/20259:53 AM

## 2023-05-28 NOTE — Plan of Care (Signed)
 Palliative:  I discussed with Dr. Orvil Bland. I went to visit and meet Debby. She had a phone call so I allowed her some privacy. I will re-visit in the morning to ensure that I am able to spend more time visiting with Debby and not to have a rushed visit today.   Vila Grayer, NP Palliative Medicine Team Pager [phone] (Please see [email or web address] for schedule) Team Phone [phone]

## 2023-05-28 NOTE — Progress Notes (Signed)
 Mobility Specialist - Progress Note   05/28/23 1447  Mobility  Activity Ambulated independently in hallway  Level of Assistance Independent  Assistive Device None  Distance Ambulated (ft) 700 ft  Activity Response Tolerated well  Mobility Referral Yes  Mobility visit 1 Mobility   Pt received in bed and agreeable to mobility. No complaints during session. Pt to bed after session with all needs met.    [facility]

## 2023-05-28 NOTE — Progress Notes (Signed)
   Subjective/Chief Complaint: Denies pain at present. Reports tolerating 2 beef broth, a juice, and a popsicle for dinner without emesis. Denies significant upper abdominal fullness or pressure. Having ostomy output - reports > 100 mL stool, but minimal gas in ostomy bag.   At baseline she lives at home with her husband who is disable due to ALS.    Objective: Vital signs in last 24 hours: Temp:  [97.6 F (36.4 C)-98.3 F (36.8 C)] 98.3 F (36.8 C) (04/29 0530) Pulse Rate:  [65-85] 65 (04/29 0530) Resp:  [14-18] 17 (04/29 0530) BP: (112-122)/(65-74) 119/69 (04/29 0530) SpO2:  [95 %-99 %] 95 % (04/29 0530) Last BM Date : 05/28/23  Intake/Output from previous day: 04/28 0701 - 04/29 0700 In: 2081.5 [P.O.:240; I.V.:1841.5] Out: 150 [Stool:150] Intake/Output this shift: Total I/O In: 360 [P.O.:360] Out: 180 [Stool:180]  Soft nontender.  Stoma viable, mild prolapse, ostomy appliance currently empty. No rebound or guarding.  Scars noted.  Lab Results:  Recent Labs    05/27/23 0557 05/28/23 0415  WBC 7.9 8.2  HGB 11.0* 10.7*  HCT 34.1* 33.1*  PLT 310 296   BMET Recent Labs    05/27/23 0557 05/28/23 0415  NA 133* 134*  K 4.0 4.1  CL 101 105  CO2 23 22  GLUCOSE 174* 172*  BUN 20 20  CREATININE 0.55 0.51  CALCIUM  9.2 9.2   PT/INR No results for input(s): "LABPROT", "INR" in the last 72 hours. ABG No results for input(s): "PHART", "HCO3" in the last 72 hours.  Invalid input(s): "PCO2", "PO2"  Studies/Results: No results found.   Anti-infectives: Anti-infectives (From admission, onward)    None       Assessment/Plan: SBO Peritoneal carcinomatosis on chemotherapy History of ex lap with radical tumor debulking including total omentectomy, resection of peritoneal disease, SBR with reanastomosis, en bloc resection of the left fallopian tube and ovary as well as rectosigmoid colon, left ureterolysis, right salpingo-oophorectomy, end descending colostomy  11/2022 by Dr. Orvil Bland. Now s/p 4 cycles of adj C/T (last on 03/25/23). She was on Zejula  prior to admission (last dose about 2 weeks ago).    - CT scan concerning for early or intermittent small bowel obstruction with transition at the level of the prior ileocolic anastomosis - Suspect partial SBO - Passed contrast into colon 4/19  - Steroids per GYN ONC - Cont BID 34 g miralax  and BID Senna, stomal suppositories as well - Keep K >=4, Phos >= 3, Mg >= 2 and mobilize for bowel function -  Surgical intervention would be difficult and higher risk due to recent chemotherapy and peritoneal carcinomatosis.   FEN: tolerating CLD, advance to FLD, pt encouraged to back herself back off to CLD if she has increased abd pain or fullness with fulls. Ensure. Continue TPN ID: None VTE: Please hold Eliquis .  Okay for heparin  drip if clinically indicated    Discussed IR G tube vs. PEG tube vs. Surgical gastrostomy tube with the patient today. TRH is planning for palliative care to meet with the patient today regarding GOC. Will also discuss chemotherapy plans with primary team. Hopefully PO intake will continue to improve and she can resume her systemic therapy +/- venting gastrostomy tube.   LOS: 12 days    Charlott Converse PA-C 05/28/2023 High complexity

## 2023-05-28 NOTE — Plan of Care (Signed)
  Problem: Education: Goal: Knowledge of General Education information will improve Description: Including pain rating scale, medication(s)/side effects and non-pharmacologic comfort measures Outcome: Progressing   Problem: Health Behavior/Discharge Planning: Goal: Ability to manage health-related needs will improve Outcome: Progressing   Problem: Clinical Measurements: Goal: Ability to maintain clinical measurements within normal limits will improve Outcome: Progressing Goal: Will remain free from infection Outcome: Progressing Goal: Diagnostic test results will improve Outcome: Progressing Goal: Respiratory complications will improve Outcome: Progressing Goal: Cardiovascular complication will be avoided Outcome: Progressing   Problem: Pain Managment: Goal: General experience of comfort will improve and/or be controlled Outcome: Progressing   Problem: Safety: Goal: Ability to remain free from injury will improve Outcome: Progressing   Problem: Elimination: Goal: Will not experience complications related to bowel motility Outcome: Progressing Goal: Will not experience complications related to urinary retention Outcome: Progressing   Problem: Coping: Goal: Level of anxiety will decrease Outcome: Progressing   Problem: Nutrition: Goal: Adequate nutrition will be maintained Outcome: Progressing   Problem: Safety: Goal: Ability to remain free from injury will improve Outcome: Progressing   Problem: Skin Integrity: Goal: Risk for impaired skin integrity will decrease Outcome: Progressing

## 2023-05-28 NOTE — Progress Notes (Addendum)
 [facility]  PROGRESS NOTE  JAYDEN WIGGERS BOF:[id number] DOB: May 25, 1945 DOA: 05/16/2023 PCP: Clemens Curt, MD   Brief HPI:   78 year old female with ovarian cancer and carcinomatosis s/p ex lap with tumor debulking, total omentectomy, SBR with 3 anastomosis, resection of ovary and rectosigmoid., DVT, GERD admitted last night for possible partial small bowel obstruction with probable transition point at the level of colonic anastomosis. 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 On FLD- Diet-advanced to soft  4/25-but having nausea and vomiting, ostomy still functioning, per surgery concern for progressive mild obstruction from carcinomatosis and progression of cancer-for now continue soft diet May require NG tube decompression per CCS-also advised goals of care discussion by GYN Continue current MiraLAX  bid senna bid and stomal suppository Continue holding Eliquis .  Restart Eliquis  on discharge as per GYN oncology Per [facility] patient with difficult and higher risks for surgical intervention Per GYN oncology Decadron  has been started 4/23 as suspecting it is malignant partial small bowel obstruction due to small volume and peritoneal disease. -General Surgery is recommending venting gastrostomy, GYN oncology also recommended  palliative care consult. - Palliative care consulted for goals of care discussion.   Hypokalemia Hyponatremia: Resolved.   Painful swallowing Throat pain: Likely in the setting of NG tube insertion/trauma-much improved following NG tube removal.      Hypertension: BP stable. PTA not taking metoprolol -discontinued.     Medications     vitamin C   1,000 mg Oral Daily   Chlorhexidine  Gluconate Cloth  6 each Topical Daily   dexamethasone  (DECADRON ) injection  6 mg Intravenous Q24H   enoxaparin  (LOVENOX ) injection  40 mg Subcutaneous Q24H   feeding supplement  237 mL Oral TID BM   insulin  aspart  0-9 Units Subcutaneous Q6H   metoprolol  tartrate  5 mg Intravenous Q12H   pantoprazole   40 mg Oral Daily   polyethylene glycol  34 g Oral BID   rosuvastatin   20 mg Oral QHS   senna  1 tablet Oral BID     Data Reviewed:   CBG:  Recent Labs  Lab 05/27/23 0550 05/27/23 1142 05/27/23 1805 05/27/23 2352 05/28/23 0532  GLUCAP 183* 309* 171* 215* 176*    SpO2: 95 %    Vitals:   05/27/23 0546 05/27/23 1157 05/27/23 2017 05/28/23 0530  BP: (!) 114/59 122/74 112/65 119/69  Pulse: 63 66 85 65  Resp: 16 18 14 17   Temp: 97.6 F (36.4 C) 97.8 F (36.6 C) 97.6 F (36.4 C) 98.3 F (36.8 C)  TempSrc: Oral Oral Oral Oral  SpO2: 97% 99% 97% 95%  Weight:      Height:          Data Reviewed:  Basic Metabolic Panel: Recent Labs  Lab 05/22/23 0451 05/23/23 0438 05/24/23 0721 05/25/23 0728 05/26/23 1138 05/27/23 0557 05/28/23 0415  NA 137 135 134* 136 136 133* 134*  K 4.3  4.4 3.9 4.1 3.7 4.0 4.1  CL 104 103 104 101 100 101 105  CO2 25 23 21* 26 24 23 22   GLUCOSE 148* 193* 146* 153* 118* 174* 172*  BUN 10 12 16 19 19 20 20   CREATININE 0.47 0.50 0.41* 0.56 0.53 0.55 0.51  CALCIUM  9.0 9.4 9.3 9.4 9.7 9.2 9.2  MG 2.0 2.0 2.0 2.0  --  2.2  --   PHOS 3.8 3.5 3.0 4.0  --  3.6  --     CBC: Recent Labs  Lab 05/26/23 1138 05/27/23 0557 05/28/23 0415  WBC 8.7 7.9 8.2   HGB 11.5* 11.0* 10.7*  HCT 35.5* 34.1* 33.1*  MCV 96.7 98.3 98.2  PLT 314 310 296    LFT Recent Labs  Lab 05/22/23 0451 05/23/23 0438 05/24/23 0721 05/27/23 0557  AST 12* 15 14* 15  ALT 9 10 11 14   ALKPHOS 56 61 61 59  BILITOT 0.5 0.7 0.3 0.4  PROT 5.9* 6.4* 6.5 6.5  ALBUMIN  2.8* 3.1* 3.0* 3.2*     Antibiotics: Anti-infectives (From admission, onward)    None        DVT prophylaxis: Lovenox   Code Status: Full code  Family Communication: No family at bedside   CONSULTS    Subjective   Denies abdominal pain.   Objective    Physical Examination:  Appears in no acute distress S1-S2, regular Abdomen is soft, nontender, colostomy in place   Status is: Inpatient:             Pervis Macintyre S Baltazar Pekala   [facility] If 7PM-7AM, please contact night-coverage at [email or web address], Office  [phone]   05/28/2023, 8:15 AM  LOS: 12 days

## 2023-05-28 NOTE — Consult Note (Signed)
 Consultation Note Date: 05/29/2023   Patient Name: Rachel Carlson  DOB: 1945/11/25  MRN: [id number]  Age / Sex: 78 y.o., female  PCP: Clemens Curt, MD Referring Physician: Ozell Blunt, MD  Reason for Consultation: Establishing goals of care  HPI/Patient Profile: 77 y.o. female  with past medical history of  ovarian cancer and carcinomatosis s/p ex lap with tumor debulking, total omentectomy, small bowel resection with 3 anastomosis, resection of ovary and rectosigmoid, s/p colostomy, DVT, fibroids, GERD, history of hiatal hernia, uterine prolapse, adenomatosis polyp, hyperlipidemia, hypertension, neuromuscular disorder, PVCs, tubal pregnancy, ulcerative proctosigmoiditis, urinary origin incontinence admitted on 05/16/2023 with emesis with partial small bowel obstruction. Faced with decision for venting G-tube.   Clinical Assessment and Goals of Care: Consult received and chart review completed. I met today with Rachel Carlson. She is lying in bed. We were joined by Dr. Ada Acres. We reviewed plan to avoid G-tube placement at this time (although open to this if needed for symptom management when indicated). She understands that she is not a good candidate for surgical intervention otherwise.   We spent time reviewing her diagnosis, prognosis, and her experiences with other family/ friends during their end of life. She talks of making plans for care for her husband who she is currently primary caregiver for (he has a rare form of ALS). They have been discussing their wishes for cremation. Rachel Carlson is tearful discussing these topics and we acknowledge the difficulty in facing the reality of their situation and circumstances. Rachel Carlson is tearful but also tells me that she is realistic and knows what is coming. She reports that she and her husband have completed directives although they need to review and ensure this still reflects  their wishes.   We discussed code status. After discussion Rachel Carlson shares that she does not desire resuscitation. She agrees to DNR. She wants to live and to have the best quality of life. She does not want to suffer. She wants to have comfort when she is suffering - she is not to this point yet. She shares poor experiences with hospice. We discussed how hospice is supposed to work to manage symptoms with a goal to optimize quality of life. We did discuss that unfortunately sometimes people are faced with the difficult decision to prioritize their comfort and relief over alertness/awareness. Rachel Carlson understands. She is still hesitant towards hospice but is hopeful for some time to continue to plan and prepare for what is coming. I encouraged her to continue to feel what she needs and it is okay to feel sad and to cry without apologies.   All questions/concerns addressed. Emotional support provided.   Primary Decision Maker PATIENT    SUMMARY OF RECOMMENDATIONS   - DNR decided - Time for outcomes - Hopeful for avoid G-tube for now but desires if indicated - Would benefit from outpatient palliative care  Code Status/Advance Care Planning: DNR   Symptom Management:  Per attending, oncology, surgery  Prognosis:  Overall prognosis poor with advanced cancer.   Discharge Planning:  Home with Palliative Services      Primary Diagnoses: Present on Admission:  Partial small bowel obstruction (HCC)  Prediabetes  GERD (gastroesophageal reflux disease)  Essential hypertension  Iron deficiency anemia  Hyperlipidemia  Hypokalemia   I have reviewed the medical record, interviewed the patient and family, and examined the patient. The following aspects are pertinent.  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)    Esophageal stricture    Family history of breast  cancer    Family history of colon cancer    Family history of melanoma    Family history of prostate cancer    Fibroids 1998   uterine, history of (left ovaries)   GERD (gastroesophageal reflux disease)    History of hiatal hernia    History of uterine prolapse    Hx of adenomatous polyp of colon 10/19/2014   Hyperlipidemia    Hypertension    borderline no meds diet and exercise   Nausea and vomiting 05/19/2023   Neuromuscular disorder (HCC)    neuropathy from chemo   Obesity    Ovarian cancer (HCC)    PVC (premature ventricular contraction)    first dx by holter in 1980's, echo (6/10) EF 55-60%, normal diastolic fxn, normal size RV and fxn, mild MR, PASP   Squamous cell carcinoma of arm    ovarian   Tubal pregnancy    Ulcerative proctosigmoiditis (HCC)    Urge urinary incontinence 06/03/2007   Qualifier: Diagnosis of  By: Lennice Quivers CMA, Kelly Patient     Social History   Socioeconomic History   Marital status: Married    Spouse name: Not on file   Number of children: 3   Years of education: Not on file   Highest education level: Not on file  Occupational History   Occupation: International aid/development worker    Comment: retired 2009    Employer: unemployed  Tobacco Use   Smoking status: Never   Smokeless tobacco: Never  Vaping Use   Vaping status: Never Used  Substance and Sexual Activity   Alcohol  use: No    Alcohol /week: 0.0 standard drinks of alcohol     Comment: rarely beer   Drug use: No   Sexual activity: Not Currently  Other Topics Concern   Not on file  Social History Narrative   Retired International aid/development worker, 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)   Grown children   Never smoker no alcohol  tobacco or drug use   Social Drivers of Corporate investment banker Strain: Low Risk  (08/28/2022)   Overall Financial Resource Strain (CARDIA)    Difficulty of Paying Living Expenses: Not hard at all  Food Insecurity: No Food Insecurity (05/16/2023)   Hunger Vital Sign    Worried  About Running Out of Food in the Last Year: Never true    Ran Out of Food in the Last Year: Never true  Transportation Needs: No Transportation Needs (05/16/2023)   PRAPARE - Administrator, Civil Service (Medical): No    Lack of Transportation (Non-Medical): No  Physical Activity: Inactive (08/28/2022)   Exercise Vital Sign    Days of Exercise per Week: 0 days    Minutes of Exercise per Session: 0 min  Stress: No Stress Concern Present (08/28/2022)   [employer] of Occupational Health - Occupational Stress Questionnaire  Feeling of Stress : Not at all  Social Connections: Moderately Integrated (05/16/2023)   Social Connection and Isolation Panel [NHANES]    Frequency of Communication with Friends and Family: More than three times a week    Frequency of Social Gatherings with Friends and Family: More than three times a week    Attends Religious Services: Never    Database administrator or Organizations: Yes    Attends Engineer, structural: More than 4 times per year    Marital Status: Married   Family History  Problem Relation Age of Onset   Heart attack Mother 90   Transient ischemic attack Mother    Diabetes type II Mother    Sudden death Mother        died age 67   Diabetes Mother    Coronary artery disease Mother    Skin cancer Mother    Barrett's esophagus Mother    Sudden death Father 60       "coronary arteriosclerosis" on death certificate   Breast cancer Sister        dx in her 9s   Colon polyps Sister        adenomatous   Arrhythmia Brother    Colon cancer Brother 39   Congestive Heart Failure Maternal Grandmother    Prostate cancer Maternal Grandfather        dx in 26s   Tuberculosis Paternal Grandfather    Ehlers-Danlos syndrome Daughter    Colon cancer Son 69       lynch syndrome   Other Son        parotid gland tumor   Skin cancer Maternal Aunt    Barrett's esophagus Maternal Aunt    Melanoma Maternal Uncle    Colon cancer  Cousin        mid 43s; maternal cousin   Colon cancer Other        MGMs brother   Esophageal cancer Neg Hx    Stomach cancer Neg Hx    Rectal cancer Neg Hx    Uterine cancer Neg Hx    Bladder Cancer Neg Hx    Scheduled Meds:  vitamin C   1,000 mg Oral Daily   Chlorhexidine  Gluconate Cloth  6 each Topical Daily   dexamethasone  (DECADRON ) injection  6 mg Intravenous Q24H   enoxaparin  (LOVENOX ) injection  40 mg Subcutaneous Q24H   feeding supplement  237 mL Oral TID BM   insulin  aspart  0-15 Units Subcutaneous Q4H   metoprolol  tartrate  5 mg Intravenous Q12H   pantoprazole   40 mg Oral Daily   polyethylene glycol  34 g Oral BID   rosuvastatin   20 mg Oral QHS   senna  1 tablet Oral BID   Continuous Infusions:  TPN ADULT (ION) 80 mL/hr at 05/27/23 1752   PRN Meds:.acetaminophen  **OR** acetaminophen , benzocaine , diclofenac  Sodium, menthol -cetylpyridinium, morphine  injection, ondansetron  **OR** ondansetron  (ZOFRAN ) IV, phenol, sodium chloride , sodium chloride  flush, temazepam  Allergies  Allergen Reactions   Aspartame     Intolerance to artifical sweeteners, pt states caused burning of bladder   Levaquin  [Levofloxacin ] Nausea Only   Pantoprazole  Sodium Diarrhea    Abdominal pain   Ranitidine Diarrhea    abdominal pain   Omeprazole  Diarrhea    Abdominal pain  Other Reaction(s): Not available   Sulfasalazine  Rash and Other (See Comments)    Fever, chills, headache, muscle pain also   Review of Systems  Constitutional:  Positive for activity change, appetite change and fatigue.  Physical Exam Vitals and nursing note reviewed.  Constitutional:      General: She is not in acute distress. Cardiovascular:     Rate and Rhythm: Normal rate.  Pulmonary:     Effort: No tachypnea, accessory muscle usage or respiratory distress.  Abdominal:     Palpations: Abdomen is soft.     Comments: + ostomy  Neurological:     Mental Status: She is alert and oriented to person, place, and  time.     Vital Signs: BP 119/69 (BP Location: Right Arm)   Pulse 65   Temp 98.3 F (36.8 C) (Oral)   Resp 17   Ht 5\' 2"  (1.575 m)   Wt 67.7 kg   LMP 05/29/1996   SpO2 95%   BMI 27.29 kg/m  Pain Scale: 0-10 POSS *See Group Information*: 1-Acceptable,Awake and alert Pain Score: 0-No pain   SpO2: SpO2: 95 % O2 Device:SpO2: 95 % O2 Flow Rate: .   IO: Intake/output summary:  Intake/Output Summary (Last 24 hours) at 05/28/2023 1033 Last data filed at 05/28/2023 0900 Gross per 24 hour  Intake 2441.52 ml  Output 180 ml  Net 2261.52 ml    LBM: Last BM Date : 05/28/23 Baseline Weight: Weight: 72.6 kg Most recent weight: Weight: 67.7 kg     Palliative Assessment/Data:    Time Total: 80 min  Greater than 50%  of this time was spent counseling and coordinating care related to the above assessment and plan.  Signed by: Vila Grayer, NP Palliative Medicine Team Pager # [phone] (M-F 8a-5p) Team Phone # [phone] (Nights/Weekends)

## 2023-05-28 NOTE — Progress Notes (Signed)
 Nutrition Follow-up  DOCUMENTATION CODES:   Not applicable  INTERVENTION:  - Plan to continue goal TPN.              - TPN management per pharmacy.    - Full Liquid diet per CCS.  - Ensure Plus High Protein po TID, each supplement provides 350 kcal and 20 grams of protein.   - Daily weights.   NUTRITION DIAGNOSIS:   Inadequate oral intake related to altered GI function (partial small bowel obstruction) as evidenced by meal completion < 50%. *new  GOAL:   Patient will meet greater than or equal to 90% of their needs *progressing; on TPN and diet  MONITOR:   PO intake, Supplement acceptance, Diet advancement, Labs, Weight trends  REASON FOR ASSESSMENT:   Consult Other (Comment) (diet with venting G-tube)  ASSESSMENT:   PMH peritoneal carcinomatosis, IDS (11/2022 - exploratory laparotomy with radical tumor debulking including total omentectomy, resection of peritoneal disease, small bowel resection with 3 anastomoses, en bloc resection of the left fallopian tube and ovary as well as rectosigmoid colon, left ureterolysis, right salpingo-oophorectomy, end descending colostomy) who presented to the ED on 05/16/23 with c/o n/v. Abdominal CT on 05/16/23 showed findings with concern for SBO and abd xray on 4/19 showed partial SBO.  4/17 Admit; NGT placed for suction 4/18 CLD 4/19 NPO 4/20 TPN Initiated  4/21 CLD; NGT removed 4/24 FLD 4/25 Soft diet, weaning to half TPN 4/26 several  episodes of emesis; NPO 4/27 TPN increased back to goal 4/28 CLD 4/29 FLD  Patient had several episodes of emesis on 4/26 so was increased back up to full TPN on 4/27 and has been slowly advancing diet.  Did okay with clears yesterday so advanced to fulls today per CCS. Patient drinking an Ensure this morning and reports tolerating. She plans to order some grits, yogurt, and strained soup for lunch but likely not eat all of it.   Patient considering venting G-tube for pallation. Patient to meet  with palliative care today to make decisions. Gyn Onc requesting RD consult to discuss diet with venting G-tube. Discussed with Gyn Onc and CCS and at this time diet advancement and long term diet plan pending resolution of ongoing partial small bowel obstruction.  Confirmed with CCS there are technically no diet restrictions if venting G-tube placed. However, patient will likely need a soft/low fiber diet which Gyn Onc confirmed.   Spent time discussing this with patient but reminded her this may change pending clinical course over the next few days.  At this time, plan to monitor for diet tolerance and continue Ensures. Also plan to continue full strength TPN while diet advancement ongoing.    Admit weight: 160# Current weight: 149# I&O's: +9.9L since admit  Medications reviewed and include: 1000mg  vitamin C , Miralax , Senokot   Labs reviewed:  Na 134 Triglycerides 179 (as of 4/28)   Diet Order:   Diet Order             Diet full liquid Fluid consistency: Thin  Diet effective now                   EDUCATION NEEDS:  Education needs have been addressed  Skin:  Skin Assessment: Reviewed RN Assessment  Last BM:  4/29 - colostomy  Height:  Ht Readings from Last 1 Encounters:  05/16/23 5\' 2"  (1.575 m)   Weight:  Wt Readings from Last 1 Encounters:  05/27/23 67.7 kg    BMI:  Body  mass index is 27.29 kg/m.  Estimated Nutritional Needs:  Kcal:  1750-2000 Protein:  90-115 Fluid:  >1.75 L/day or per MD    Scheryl Cushing RD, LDN Contact via Secure Chat.

## 2023-05-28 NOTE — Consult Note (Addendum)
 WOC Nurse ostomy consult note Pt had ostomy surgery 11/24 and is independent with pouch application and emptying prior to admission when at home, but states she currently is unable to visualize the stoma when in bed. She is followed in the outpatient ostomy clinic for stoma prolapse.  Currently, stoma is red and viable, above skin level,  currently no prolapse. Cut opening cut to 1 1/2 inches. Applied barrier ring and one piece convex pouch. 30cc liquid brown stool in the pouch. Used stoma powder and skin prep wipe to crust the skin. Applied belt.   5 sets of each supply have been provided for the patient and are in her luggage, as well as one set left at the bedside for staff nurse's use. Use Supplies: barrier ring Lawson # [id number] and convex pouch [id number] She requests another pouch change be performed on Fri if she is still in the hospital at that time.  Thank-you,  Wiliam Harder MSN, RN, CWOCN, [place], CNS [phone]

## 2023-05-29 DIAGNOSIS — Z515 Encounter for palliative care: Secondary | ICD-10-CM

## 2023-05-29 DIAGNOSIS — R112 Nausea with vomiting, unspecified: Secondary | ICD-10-CM | POA: Diagnosis not present

## 2023-05-29 DIAGNOSIS — C482 Malignant neoplasm of peritoneum, unspecified: Secondary | ICD-10-CM | POA: Diagnosis not present

## 2023-05-29 DIAGNOSIS — K56600 Partial intestinal obstruction, unspecified as to cause: Secondary | ICD-10-CM | POA: Diagnosis not present

## 2023-05-29 DIAGNOSIS — Z7189 Other specified counseling: Secondary | ICD-10-CM | POA: Diagnosis not present

## 2023-05-29 DIAGNOSIS — Z66 Do not resuscitate: Secondary | ICD-10-CM | POA: Diagnosis not present

## 2023-05-29 LAB — CBC
HCT: 33.7 % — ABNORMAL LOW (ref 36.0–46.0)
Hemoglobin: 10.7 g/dL — ABNORMAL LOW (ref 12.0–15.0)
MCH: 31 pg (ref 26.0–34.0)
MCHC: 31.8 g/dL (ref 30.0–36.0)
MCV: 97.7 fL (ref 80.0–100.0)
Platelets: 264 10*3/uL (ref 150–400)
RBC: 3.45 MIL/uL — ABNORMAL LOW (ref 3.87–5.11)
RDW: 16.6 % — ABNORMAL HIGH (ref 11.5–15.5)
WBC: 7.6 10*3/uL (ref 4.0–10.5)
nRBC: 0 % (ref 0.0–0.2)

## 2023-05-29 LAB — GLUCOSE, CAPILLARY
Glucose-Capillary: 179 mg/dL — ABNORMAL HIGH (ref 70–99)
Glucose-Capillary: 151 mg/dL — ABNORMAL HIGH (ref 70–99)
Glucose-Capillary: 146 mg/dL — ABNORMAL HIGH (ref 70–99)
Glucose-Capillary: 120 mg/dL — ABNORMAL HIGH (ref 70–99)
Glucose-Capillary: 217 mg/dL — ABNORMAL HIGH (ref 70–99)
Glucose-Capillary: 246 mg/dL — ABNORMAL HIGH (ref 70–99)

## 2023-05-29 LAB — BASIC METABOLIC PANEL WITH GFR
Anion gap: 10 (ref 5–15)
BUN: 22 mg/dL (ref 8–23)
CO2: 22 mmol/L (ref 22–32)
Calcium: 9 mg/dL (ref 8.9–10.3)
Chloride: 103 mmol/L (ref 98–111)
Creatinine, Ser: <0.3 mg/dL — ABNORMAL LOW (ref 0.44–1.00)
Glucose, Bld: 180 mg/dL — ABNORMAL HIGH (ref 70–99)
Potassium: 4.2 mmol/L (ref 3.5–5.1)
Sodium: 135 mmol/L (ref 135–145)

## 2023-05-29 LAB — MAGNESIUM: Magnesium: 2 mg/dL (ref 1.7–2.4)

## 2023-05-29 LAB — PHOSPHORUS: Phosphorus: 3.3 mg/dL (ref 2.5–4.6)

## 2023-05-29 MED ORDER — TRAVASOL 10 % IV SOLN
INTRAVENOUS | Status: AC
  Filled 2023-05-29: qty 499.2

## 2023-05-29 NOTE — Progress Notes (Signed)
 PHARMACY - TOTAL PARENTERAL NUTRITION CONSULT NOTE   Indication: Prolonged ileus  Patient Measurements: Height: 5\' 2"  (157.5 cm) Weight: 67.7 kg (149 lb 3.2 oz) IBW/kg (Calculated) : 50.1 TPN AdjBW (KG): 55.7 Body mass index is 27.29 kg/m. 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 Abdominal CT on 05/16/23 showed findings with concern for SBO and abd xray on 4/19 showed partial SBO. Pharmacy has been consulted on 05/19/23 to start TPN for prolonged ileus.  Glucose / Insulin : no hx DM; - CBGs (goal <150) 140 - 208 - 13 units SSI in last 24 hrs. Daily Decadron  started 4/23 is likely causing elevation in CBGs, increased PO intake in addition to full-rate TPN may also be causing increased CBGs. Electrolytes: All lytes WNL - Goal for ileus: K >=4, Phos >= 3, Mg >= 2  Renal: Scr low, BUN WNL Hepatic: albumin  low but improved; LFTs, alk phos, and Tbili WNL (from 4/28 labs) - TGs slightly elevated at 179 (4/28) Intake / Output; MIVF: - NGT removed 4/21  - UOP: 2x - Stool output: 180 mL/24 hr - PO (FLD): 840 mL/24 hr   >Pt has had better PO intake for the last 24hrs and reports no significant abd cramping, nausea, or vomiting. GI Imaging: - 4/17 abd CT: Findings concerning for early or intermittent small bowel obstruction with transition at the level of prior ileocolic anastomosis. Bilateral pleural effusions  - 4/19 abd xray: Findings consistent with a partial small bowel obstruction  - 4/23 abd xray: persistent small bowel dilation - 4/24 abd xray: persistent small bowel  dilation stable from the previous study 4/27 abd xray: improvement in the degree of small-bowel dilation GI Surgeries / Procedures:   Central access: Port-a- cath  TPN start date: 05/19/23  Nutritional Goals: Goal TPN rate is 80 mL/hr (provides 99.8 g of protein and 1896 kcals per day)  RD Assessment:  Estimated Needs Total Energy Estimated Needs: 1750-2000 Total Protein Estimated Needs: 90-115 Total Fluid Estimated Needs: >1.75 L/day or per MD  Current Nutrition:  TPN Advance to soft diet 4/30, per gyn onc  Plan:  At 1800:  -Decrease TPN to half rate @40  mL/hr    - Electrolytes in TPN:  Na 120 mEq/L K 27mEq/L Ca 50mEq/L Mg 87mEq/L Phos 15mmol/L Cl:Ac 1:1 - Add standard MVI and trace elements to TPN - Continue mSSI q4h  - At minimum, monitor TPN labs on Mon/Thurs - F/u TPN discontinuation 5/1 if patient tolerates soft diet today - F/u plans for possible venting gastrostomy tube placement   Roselee Cong, PharmD Clinical Pharmacist  4/30/202510:11 AM

## 2023-05-29 NOTE — Plan of Care (Signed)

## 2023-05-29 NOTE — Progress Notes (Signed)
 Gynecologic Oncology Progress Note   Subjective: Patient reports doing well this am. No nausea or emesis since being advanced to full liquid diet yesterday. No significant abdominal cramping reported. Increase in ostomy output yesterday am with 250 cc out, slowed output for the afternoon, then increased output currently in ostomy now. Voiding without difficulty. Ambulating in the halls frequently throughout the day.   Objective: Vital signs in last 24 hours: Temp:  [98 F (36.7 C)-98.9 F (37.2 C)] 98.9 F (37.2 C) (04/30 0355) Pulse Rate:  [60-91] 76 (04/30 0355) Resp:  [18] 18 (04/30 0355) BP: (110-118)/(66-71) 113/71 (04/30 0355) SpO2:  [97 %-99 %] 98 % (04/30 0355) Last BM Date : 05/28/23  Intake/Output from previous day: 04/29 0701 - 04/30 0700 In: 3607.6 [P.O.:840; I.V.:2767.6] Out: 180 [Stool:180]  Physical Examination: General: alert, cooperative, and no distress Resp: clear to auscultation bilaterally Cardio: regular rate and rhythm, S1, S2 normal, no murmur, click, rub or gallop GI: active bowel sounds, abdomen mildly tympanic, soft, mildly distended in the upper abd (less than prev assessment), ostomy with moderate gas and liquid stool, stoma non-edematous/stoma pink and viable Extremities: extremities normal, atraumatic, no cyanosis or edema Right chest port accessed with intact dressing with no surrounding erythema   Labs:    Latest Ref Rng & Units 05/29/2023    4:33 AM 05/28/2023    4:15 AM 05/27/2023    5:57 AM  CBC  WBC 4.0 - 10.5 K/uL 7.6  8.2  7.9   Hemoglobin 12.0 - 15.0 g/dL 16.1  09.6  04.5   Hematocrit 36.0 - 46.0 % 33.7  33.1  34.1   Platelets 150 - 400 K/uL 264  296  310       Latest Ref Rng & Units 05/29/2023    4:33 AM 05/28/2023    4:15 AM 05/27/2023    5:57 AM  BMP  Glucose 70 - 99 mg/dL 409  811  914   BUN 8 - 23 mg/dL 22  20  20    Creatinine 0.44 - 1.00 mg/dL <7.82  9.56  2.13   Sodium 135 - 145 mmol/L 135  134  133   Potassium 3.5 - 5.1  mmol/L 4.2  4.1  4.0   Chloride 98 - 111 mmol/L 103  105  101   CO2 22 - 32 mmol/L 22  22  23    Calcium  8.9 - 10.3 mg/dL 9.0  9.2  9.2    0/86/5784 KUB: Improvement in the degree of small-bowel dilatation.   Assessment: 78 y.o. admitted with partial small bowel obstruction with transition at the level of prior ileocolic anastomosis. 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 Now s/p 4 cycles of adj C/T (last on 03/25/23). She was currently on Ze[place]ula  prior to admission.   Partial SBO: Gen Surg input appreciated. Currently on full liquid diet and tolerating this well. TPN continues to infuse. Possibility of G tube placement previously discussed, will be revisited by Dr. Orvil Bland today. Not a surgical candidate for major surgery per GYN ONC Team at this time.   Stoma prolapse: Stoma healthy--minimal prolapse when she remains recumbent.   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 Prophylactic lovenox  during hospital stay, can restart Eliquis  on discharge.    LOS: 13 days    Suellyn Emory, MD 05/29/2023, 7:43 AM

## 2023-05-29 NOTE — Progress Notes (Signed)
 Mobility Specialist - Progress Note   05/29/23 1138  Mobility  Activity Ambulated independently in hallway  Level of Assistance Independent  Assistive Device None  Distance Ambulated (ft) 1400 ft  Activity Response Tolerated well  Mobility Referral Yes  Mobility visit 1 Mobility  Mobility Specialist Start Time (ACUTE ONLY) 1107  Mobility Specialist Stop Time (ACUTE ONLY) 1135  Mobility Specialist Time Calculation (min) (ACUTE ONLY) 28 min   Pt received in bed and agreeable to mobility. No complaints during session. Pt to bed after session with all needs met.    [facility]

## 2023-05-29 NOTE — Progress Notes (Signed)
 PROGRESS NOTE    Rachel Carlson  MVH:[id number] DOB: 1945/12/13 DOA: 05/16/2023 PCP: Clemens Curt, MD  Chief Complaint  Patient presents with   Emesis    Brief Narrative:   78 year old female with hx stage IV B primary peritoneal carcinoma s/p ex lap with tumor debulking, total omentectomy, SBR with 3 anastomosis, resection of ovary and rectosigmoid and multiple other medical issues here with SBO.    Assessment & Plan:   Principal Problem:   Partial small bowel obstruction (HCC) Active Problems:   Hyperlipidemia   GERD (gastroesophageal reflux disease)   Prediabetes   Essential hypertension   Iron deficiency anemia   Hypokalemia   Nausea and vomiting   Primary peritoneal adenocarcinoma (HCC)   Dysuria  Goals of care Appreciate palliative involvement  SBO Peritoneal carcinomatosis  hx Stage IVB Primary Peritoneal Carcinoma History of ex lap with radical tumor debulking surgery discharge various resections and colostomy 11/2022 CT 4/17 with early or intermittent SBO with transition at the level of prior ileocolic anastomosis Appreciate surgery assistance - last note 4/29, they note surgical intervention difficult and higher risk due to chemo and peritoneal carcinomatosis Appreciate gyn onc assistance - considering G tube, but sounds like not yet.  Planning to trial soft diet.  If tolerates liquids/soft food, plan to d/c home as long as she's having ostomy output.  If she fails diet advancement or has recurrent symptoms, she'd need G tube placement for symptom management.  Steroids per gyn onc with concern for malignant pSBO TPN Plan to resume Eliquis  on discharge as per GYN oncology Per [facility] patient with difficult and higher risks for surgical intervention  Bilateral Effusions Will follow CXR 5/1   Hypokalemia Hyponatremia: Resolved.   Painful swallowing Throat pain: Likely in the setting of NG tube insertion/trauma-much improved following NG tube removal.      Hypertension: BP stable. PTA not taking metoprolol -discontinued.       DVT prophylaxis: lovenox  Code Status: DNR Family Communication: none Disposition:   Status is: Inpatient Remains inpatient appropriate because: pending surgical clearance   Consultants:  Gyn onc Surgery palliative  Procedures:  none  Antimicrobials:  Anti-infectives (From admission, onward)    None       Subjective: Talking to [name] from palliative when I enter No new complaints, tolerating fulls, asking about soft diet  Objective: Vitals:   05/28/23 1123 05/28/23 2010 05/29/23 0355 05/29/23 1157  BP: 110/66 118/66 113/71 109/71  Pulse: 60 91 76 (!) 103  Resp: 18 18 18 16   Temp: 98 F (36.7 C) 98.5 F (36.9 C) 98.9 F (37.2 C) 98.9 F (37.2 C)  TempSrc:    Oral  SpO2: 99% 97% 98% 98%  Weight:      Height:        Intake/Output Summary (Last 24 hours) at 05/29/2023 1836 Last data filed at 05/29/2023 1813 Gross per 24 hour  Intake 1674.97 ml  Output 500 ml  Net 1174.97 ml   Filed Weights   05/21/23 0440 05/26/23 0542 05/27/23 0002  Weight: 71 kg 68.3 kg 67.7 kg    Examination:  General exam: Appears calm and comfortable  Respiratory system: unlabored Cardiovascular system: RRR Gastrointestinal system: Abdomen is nondistended, soft and nontender.stool in ostomy.  Central nervous system: Alert and oriented. No focal neurological deficits. Extremities: no LEE    Data Reviewed: I have personally reviewed following labs and imaging studies  CBC: Recent Labs  Lab 05/26/23 1138 05/27/23 0557 05/28/23 0415 05/29/23 0433  WBC  8.7 7.9 8.2 7.6  HGB 11.5* 11.0* 10.7* 10.7*  HCT 35.5* 34.1* 33.1* 33.7*  MCV 96.7 98.3 98.2 97.7  PLT 314 310 296 264    Basic Metabolic Panel: Recent Labs  Lab 05/23/23 0438 05/24/23 0721 05/25/23 0728 05/26/23 1138 05/27/23 0557 05/28/23 0415 05/29/23 0433  NA 135 134* 136 136 133* 134* 135  K 4.4 3.9 4.1 3.7 4.0 4.1 4.2  CL 103  104 101 100 101 105 103  CO2 23 21* 26 24 23 22 22   GLUCOSE 193* 146* 153* 118* 174* 172* 180*  BUN 12 16 19 19 20 20 22   CREATININE 0.50 0.41* 0.56 0.53 0.55 0.51 <0.30*  CALCIUM  9.4 9.3 9.4 9.7 9.2 9.2 9.0  MG 2.0 2.0 2.0  --  2.2  --  2.0  PHOS 3.5 3.0 4.0  --  3.6  --  3.3    GFR: CrCl cannot be calculated (This lab value cannot be used to calculate CrCl because it is not Rachel Carlson number: <0.30).  Liver Function Tests: Recent Labs  Lab 05/23/23 0438 05/24/23 0721 05/27/23 0557  AST 15 14* 15  ALT 10 11 14   ALKPHOS 61 61 59  BILITOT 0.7 0.3 0.4  PROT 6.4* 6.5 6.5  ALBUMIN  3.1* 3.0* 3.2*    CBG: Recent Labs  Lab 05/28/23 2338 05/29/23 0357 05/29/23 0753 05/29/23 1158 05/29/23 1614  GLUCAP 208* 179* 151* 146* 120*     No results found for this or any previous visit (from the past 240 hours).       Radiology Studies: No results found.      Scheduled Meds:  vitamin C   1,000 mg Oral Daily   Chlorhexidine  Gluconate Cloth  6 each Topical Daily   dexamethasone  (DECADRON ) injection  6 mg Intravenous Q24H   enoxaparin  (LOVENOX ) injection  40 mg Subcutaneous Q24H   feeding supplement  237 mL Oral TID BM   insulin  aspart  0-15 Units Subcutaneous Q4H   pantoprazole   40 mg Oral Daily   polyethylene glycol  34 g Oral BID   rosuvastatin   20 mg Oral QHS   senna  1 tablet Oral BID   Continuous Infusions:  TPN ADULT (ION) 40 mL/hr at 05/29/23 1813     LOS: 13 days    Time spent: over 30 min    Rachel Gains, MD [facility]   To contact the attending provider between 7A-7P or the covering provider during after hours 7P-7A, please log into the web site [email or web address] and access using universal [place] password for that web site. If you do not have the password, please call the hospital operator.  05/29/2023, 6:36 PM

## 2023-05-29 NOTE — Progress Notes (Signed)
   05/29/23 1551  Spiritual Encounters  Type of Visit Initial  Care provided to: Pt and family  Reason for visit Routine spiritual support  OnCall Visit No   Visited with patient and family, and provided Bible per request.

## 2023-05-30 ENCOUNTER — Ambulatory Visit: Payer: Medicare Other | Admitting: Obstetrics and Gynecology

## 2023-05-30 ENCOUNTER — Inpatient Hospital Stay (HOSPITAL_COMMUNITY)

## 2023-05-30 DIAGNOSIS — K56600 Partial intestinal obstruction, unspecified as to cause: Secondary | ICD-10-CM | POA: Diagnosis not present

## 2023-05-30 DIAGNOSIS — R112 Nausea with vomiting, unspecified: Secondary | ICD-10-CM | POA: Diagnosis not present

## 2023-05-30 DIAGNOSIS — C482 Malignant neoplasm of peritoneum, unspecified: Secondary | ICD-10-CM | POA: Diagnosis not present

## 2023-05-30 LAB — GLUCOSE, CAPILLARY
Glucose-Capillary: 124 mg/dL — ABNORMAL HIGH (ref 70–99)
Glucose-Capillary: 138 mg/dL — ABNORMAL HIGH (ref 70–99)
Glucose-Capillary: 150 mg/dL — ABNORMAL HIGH (ref 70–99)
Glucose-Capillary: 162 mg/dL — ABNORMAL HIGH (ref 70–99)
Glucose-Capillary: 182 mg/dL — ABNORMAL HIGH (ref 70–99)
Glucose-Capillary: 204 mg/dL — ABNORMAL HIGH (ref 70–99)
Glucose-Capillary: 205 mg/dL — ABNORMAL HIGH (ref 70–99)
Glucose-Capillary: 234 mg/dL — ABNORMAL HIGH (ref 70–99)

## 2023-05-30 LAB — COMPREHENSIVE METABOLIC PANEL WITH GFR
ALT: 24 U/L (ref 0–44)
AST: 19 U/L (ref 15–41)
Albumin: 3.1 g/dL — ABNORMAL LOW (ref 3.5–5.0)
Alkaline Phosphatase: 55 U/L (ref 38–126)
Anion gap: 11 (ref 5–15)
BUN: 24 mg/dL — ABNORMAL HIGH (ref 8–23)
CO2: 20 mmol/L — ABNORMAL LOW (ref 22–32)
Calcium: 9 mg/dL (ref 8.9–10.3)
Chloride: 102 mmol/L (ref 98–111)
Creatinine, Ser: 0.51 mg/dL (ref 0.44–1.00)
GFR, Estimated: >60 mL/min (ref >60)
Glucose, Bld: 179 mg/dL — ABNORMAL HIGH (ref 70–99)
Potassium: 4.1 mmol/L (ref 3.5–5.1)
Sodium: 133 mmol/L — ABNORMAL LOW (ref 135–145)
Total Bilirubin: 0.4 mg/dL (ref 0.0–1.2)
Total Protein: 6.1 g/dL — ABNORMAL LOW (ref 6.5–8.1)

## 2023-05-30 LAB — MAGNESIUM: Magnesium: 2.1 mg/dL (ref 1.7–2.4)

## 2023-05-30 LAB — PHOSPHORUS: Phosphorus: 3.3 mg/dL (ref 2.5–4.6)

## 2023-05-30 NOTE — Plan of Care (Signed)

## 2023-05-30 NOTE — Plan of Care (Signed)

## 2023-05-30 NOTE — Progress Notes (Signed)
 PROGRESS NOTE    Rachel Carlson  WUJ:[id number] DOB: 1945/10/18 DOA: 05/16/2023 PCP: Clemens Curt, MD  Chief Complaint  Patient presents with   Emesis    Brief Narrative:   78 year old female with hx stage IV B primary peritoneal carcinoma s/p ex lap with tumor debulking, total omentectomy, SBR with 3 anastomosis, resection of ovary and rectosigmoid and multiple other medical issues here with SBO.    Assessment & Plan:   Principal Problem:   Partial small bowel obstruction (HCC) Active Problems:   Hyperlipidemia   GERD (gastroesophageal reflux disease)   Prediabetes   Essential hypertension   Iron deficiency anemia   Hypokalemia   Nausea and vomiting   Primary peritoneal adenocarcinoma (HCC)   Dysuria  Goals of care Appreciate palliative involvement  SBO Peritoneal carcinomatosis  hx Stage IVB Primary Peritoneal Carcinoma History of ex lap with radical tumor debulking surgery discharge various resections and colostomy 11/2022 CT 4/17 with early or intermittent SBO with transition at the level of prior ileocolic anastomosis Appreciate surgery assistance - last note 4/29, they note surgical intervention difficult and higher risk due to chemo and peritoneal carcinomatosis Appreciate gyn onc assistance - considering G tube, but sounds like not yet.  Tolerating soft diet.  If tolerates liquids/soft food, plan to d/c home as long as she's having ostomy output.  If she fails diet advancement or has recurrent symptoms, she'd need G tube placement for symptom management.  Steroids per gyn onc with concern for malignant pSBO TPN weaning Plan to resume Eliquis  on discharge as per GYN oncology Per [facility] patient with difficult and higher risks for surgical intervention  Bilateral Effusions CXR with small left effusion   Hypokalemia Hyponatremia: Resolved.   Painful swallowing Throat pain: Likely in the setting of NG tube insertion/trauma-much improved following NG tube  removal.     Hypertension: BP stable. PTA not taking metoprolol -discontinued.      DVT prophylaxis: lovenox  Code Status: DNR Family Communication: none Disposition:   Status is: Inpatient Remains inpatient appropriate because: pending surgical clearance   Consultants:  Gyn onc Surgery palliative  Procedures:  none  Antimicrobials:  Anti-infectives (From admission, onward)    None       Subjective: No complaints  Objective: Vitals:   05/29/23 2000 05/29/23 2035 05/30/23 0445 05/30/23 1133  BP: 131/72 121/78 131/61 112/73  Pulse: (!) 104 100 63 79  Resp: 18 19 18 16   Temp: 98.3 F (36.8 C) 97.8 F (36.6 C) 97.9 F (36.6 C) (!) 97.4 F (36.3 C)  TempSrc: Oral Oral Oral Oral  SpO2: 97% 100% 98% 98%  Weight:      Height:        Intake/Output Summary (Last 24 hours) at 05/30/2023 1514 Last data filed at 05/30/2023 0446 Gross per 24 hour  Intake 1387.77 ml  Output 500 ml  Net 887.77 ml   Filed Weights   05/21/23 0440 05/26/23 0542 05/27/23 0002  Weight: 71 kg 68.3 kg 67.7 kg    Examination:  General: No acute distress. Cardiovascular: RRR Lungs: unlabored S/nt/nd - ostomy with loose brown stool  Neurological: Alert and oriented 3. Moves all extremities 4. Cranial nerves II through XII grossly intact. Extremities: No clubbing or cyanosis. No edema.    Data Reviewed: I have personally reviewed following labs and imaging studies  CBC: Recent Labs  Lab 05/26/23 1138 05/27/23 0557 05/28/23 0415 05/29/23 0433  WBC 8.7 7.9 8.2 7.6  HGB 11.5* 11.0* 10.7* 10.7*  HCT 35.5*  34.1* 33.1* 33.7*  MCV 96.7 98.3 98.2 97.7  PLT 314 310 296 264    Basic Metabolic Panel: Recent Labs  Lab 05/24/23 0721 05/25/23 0728 05/26/23 1138 05/27/23 0557 05/28/23 0415 05/29/23 0433 05/30/23 0433  NA 134* 136 136 133* 134* 135 133*  K 3.9 4.1 3.7 4.0 4.1 4.2 4.1  CL 104 101 100 101 105 103 102  CO2 21* 26 24 23 22 22  20*  GLUCOSE 146* 153* 118* 174* 172*  180* 179*  BUN 16 19 19 20 20 22  24*  CREATININE 0.41* 0.56 0.53 0.55 0.51 <0.30* 0.51  CALCIUM  9.3 9.4 9.7 9.2 9.2 9.0 9.0  MG 2.0 2.0  --  2.2  --  2.0 2.1  PHOS 3.0 4.0  --  3.6  --  3.3 3.3    GFR: Estimated Creatinine Clearance: 54.8 mL/min (by C-G formula based on SCr of 0.51 mg/dL).  Liver Function Tests: Recent Labs  Lab 05/24/23 0721 05/27/23 0557 05/30/23 0433  AST 14* 15 19  ALT 11 14 24   ALKPHOS 61 59 55  BILITOT 0.3 0.4 0.4  PROT 6.5 6.5 6.1*  ALBUMIN  3.0* 3.2* 3.1*    CBG: Recent Labs  Lab 05/30/23 0011 05/30/23 0144 05/30/23 0409 05/30/23 0722 05/30/23 1134  GLUCAP 182* 205* 204* 162* 124*     No results found for this or any previous visit (from the past 240 hours).       Radiology Studies: DG CHEST PORT 1 VIEW Result Date: 05/30/2023 CLINICAL DATA:  Pleural effusion. EXAM: PORTABLE CHEST 1 VIEW COMPARISON:  03/29/2023 FINDINGS: Increased blunting at the left costophrenic angle is suggestive for Kamauri Kathol small left pleural effusion. Slightly prominent lung markings appear chronic. No significant right pleural effusion. Retrocardiac opacity is compatible with Fraya Ueda hiatal hernia. Right jugular Port-Sylvain Hasten-Cath tip is near the superior cavoatrial junction. This is Tillmon Kisling CT injectable port. Trachea is midline. IMPRESSION: 1. Blunting at the left costophrenic angle is suggestive for [name] small left pleural effusion. 2. Hiatal hernia. Electronically Signed   By: [name] M.D.   On: 05/30/2023 09:22        Scheduled Meds:  vitamin C   1,000 mg Oral Daily   Chlorhexidine  Gluconate Cloth  6 each Topical Daily   dexamethasone  (DECADRON ) injection  6 mg Intravenous Q24H   enoxaparin  (LOVENOX ) injection  40 mg Subcutaneous Q24H   feeding supplement  237 mL Oral TID BM   insulin  aspart  0-15 Units Subcutaneous Q4H   pantoprazole   40 mg Oral Daily   polyethylene glycol  34 g Oral BID   rosuvastatin   20 mg Oral QHS   senna  1 tablet Oral BID   Continuous Infusions:  TPN  ADULT (ION) 40 mL/hr at 05/30/23 0446     LOS: 14 days    Time spent: over 30 min    Donnetta Gains, MD [facility]   To contact the attending provider between 7A-7P or the covering provider during after hours 7P-7A, please log into the web site [email or web address] and access using universal [place] password for that web site. If you do not have the password, please call the hospital operator.  05/30/2023, 3:14 PM

## 2023-05-30 NOTE — Consult Note (Signed)
 WOC Nurse ostomy follow up Stoma type/location: LLQ, colostomy Stomal assessment/size: 1 5/8" round, prolapsed about 3 cm, pink, moist, prolapsed with os emptying towards 3-4 o'clock  Peristomal assessment: intact, small granulomas noted at 8-9 o'clock, patient is aware of the presence of this Treatment options for stomal/peristomal skin: 2" barrier ring; patient has been using ostomy powder however her skin is intact, non denuded so I have explained that she did not need to use this unless she noted irritated skin  Output liquid brown stool Ostomy pouching: 1pc.soft convex and 2" barrier ring, although her stoma is prolapsed she continues to use convexity, this is her preference. Ok to not change patient preferences  Education provided: patient has been independent with ostomy care at home, she stands in front a mirror to perform her care, the mirror in the bathroom inpatient is too low; so she has required assistance with pouch changes. The staff on this unit are not as experience with ostomy care, therefore WOC nurses have maintained our relationship with her to assist with pouch change Old pouch removed, skin cleansed, cut opening a bit larger today. She does use belt as well and prefers to tape her lock and roll closure.  Enrolled patient in [place] Secure Start Discharge program: Yes  Ordered (5) soft one piece convex and (5) barrier rings and requested unit secretary to take to the patient's room, she has no supplies in her room.  WOC Nurse will follow along with you for continued support with ostomy care Mikah Rottinghaus [facility], RN, Center, CNS, [place] [phone]

## 2023-05-30 NOTE — Progress Notes (Signed)
 PHARMACY - TOTAL PARENTERAL NUTRITION CONSULT NOTE   Indication: Prolonged ileus  Patient Measurements: Height: 5\' 2"  (157.5 cm) Weight: 67.7 kg (149 lb 3.2 oz) IBW/kg (Calculated) : 50.1 TPN AdjBW (KG): 55.7 Body mass index is 27.29 kg/m.  Assessment: Patient is a 78 y.o F with hx peritoneal carcinomatosis, IDS (11/2022 - exploratory laparotomy with radical tumor debulking including total omentectomy, resection of peritoneal disease, small bowel resection with 3 anastomoses, en bloc resection of the left fallopian tube and ovary as well as rectosigmoid colon, left ureterolysis, right salpingo-oophorectomy, end descending colostomy) who presented to the ED on 05/16/23 with c/o n/v. Abdominal CT on 05/16/23 showed findings with concern for SBO and abd xray on 4/19 showed partial SBO. Pharmacy has been consulted on 05/19/23 to start TPN for prolonged ileus.  Glucose / Insulin : no hx DM; - CBGs (goal <150) 120 - 246 - 13 units SSI in last 24 hrs. Daily Decadron  started 4/23 is likely causing elevation in CBGs, increased PO intake in addition to full-rate TPN may also be causing increased CBGs. Electrolytes: Na slightly low at 130, all other lytes WNL - Goal for ileus: K >=4, Phos >= 3, Mg >= 2  Renal: Scr low, BUN slightly elevated at 24 Hepatic: albumin  low at 3.1; LFTs, alk phos, and Tbili WNL - TGs slightly elevated at 179 (4/28) Intake / Output; MIVF: - NGT removed 4/21  - UOP: not documented - Stool output: 500 mL/24 hr - PO (FLD): 120 mL/24 hr, RN documented 100% of meal eaten 4/30 PM GI Imaging: - 4/17 abd CT: Findings concerning for early or intermittent small bowel obstruction with transition at the level of prior ileocolic anastomosis. Bilateral pleural effusions  - 4/19 abd xray: Findings consistent with a partial small bowel obstruction  - 4/23 abd xray: persistent small bowel dilation - 4/24 abd xray: persistent small bowel dilation stable from the previous study 4/27 abd  xray: improvement in the degree of small-bowel dilation GI Surgeries / Procedures:   Central access: Port-a- cath  TPN start date: 05/19/23  Nutritional Goals: Goal TPN rate is 80 mL/hr (provides 99.8 g of protein and 1896 kcals per day)  RD Assessment:  Estimated Needs Total Energy Estimated Needs: 1750-2000 Total Protein Estimated Needs: 90-115 Total Fluid Estimated Needs: >1.75 L/day or per MD  Current Nutrition:  TPN Soft diet started 4/30 afternoon  Plan:  - Okay to wean TPN today, per gyn onc. - TPN currently running at half rate - continue current rate of 73mL/hr until order expires tonight at 1759. No more TPN after this bag is complete.    - Electrolytes in TPN:  Na 120 mEq/L K 99mEq/L Ca 8mEq/L Mg 44mEq/L Phos 15mmol/L Cl:Ac 1:1 - Continue mSSI q4h while TPN running - Pharmacy will sign off of TPN management   Roselee Cong, PharmD Clinical Pharmacist  5/1/202511:03 AM

## 2023-05-30 NOTE — Progress Notes (Signed)
 Gynecologic Oncology Progress Note   Subjective: Patient reports doing well this am. Ate eggs this am. No nausea or emesis. Had 1000 cc from ostomy yesterday with flatus. Has had soft output this am. No significant abdominal cramping reported. Voiding without difficulty. Ambulating in the halls frequently throughout the day.   Objective: Vital signs in last 24 hours: Temp:  [97.8 F (36.6 C)-98.9 F (37.2 C)] 97.9 F (36.6 C) (05/01 0445) Pulse Rate:  [63-104] 63 (05/01 0445) Resp:  [16-19] 18 (05/01 0445) BP: (109-131)/(61-78) 131/61 (05/01 0445) SpO2:  [97 %-100 %] 98 % (05/01 0445) Last BM Date : 05/29/23  Intake/Output from previous day: 04/30 0701 - 05/01 0700 In: 1387.8 [P.O.:120; I.V.:1267.8] Out: 500 [Stool:500]  Physical Examination: General: alert, cooperative, and no distress Resp: clear to auscultation bilaterally Cardio: regular rate and rhythm, S1, S2 normal, no murmur, click, rub or gallop GI: active bowel sounds, abdomen soft, less distended in the upper abd (less than prev assessment), ostomy with moderate gas and more solid stool, stoma non-edematous/stoma pink and viable Extremities: extremities normal, atraumatic, no cyanosis or edema Right chest port accessed with intact dressing with no surrounding erythema   Labs:    Latest Ref Rng & Units 05/29/2023    4:33 AM 05/28/2023    4:15 AM 05/27/2023    5:57 AM  CBC  WBC 4.0 - 10.5 K/uL 7.6  8.2  7.9   Hemoglobin 12.0 - 15.0 g/dL 52.8  41.3  24.4   Hematocrit 36.0 - 46.0 % 33.7  33.1  34.1   Platelets 150 - 400 K/uL 264  296  310       Latest Ref Rng & Units 05/30/2023    4:33 AM 05/29/2023    4:33 AM 05/28/2023    4:15 AM  BMP  Glucose 70 - 99 mg/dL 010  272  536   BUN 8 - 23 mg/dL 24  22  20    Creatinine 0.44 - 1.00 mg/dL 6.44  <0.34  7.42   Sodium 135 - 145 mmol/L 133  135  134   Potassium 3.5 - 5.1 mmol/L 4.1  4.2  4.1   Chloride 98 - 111 mmol/L 102  103  105   CO2 22 - 32 mmol/L 20  22  22     Calcium  8.9 - 10.3 mg/dL 9.0  9.0  9.2    5/95/6387 KUB: Improvement in the degree of small-bowel dilatation.   Assessment: 78 y.o. admitted with partial small bowel obstruction with transition at the level of prior ileocolic anastomosis. 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 Now s/p 4 cycles of adj C/T (last on 03/25/23). She was currently on Zejula  prior to admission.   Partial SBO: Gen Surg input appreciated. Currently on soft diet and tolerating this well. TPN to stop this evening.   Stoma prolapse: Stoma healthy--minimal prolapse when she remains recumbent.   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 Prophylactic lovenox  during hospital stay, can restart Eliquis  on discharge.  Will talk with pharmacy about steroid taper dosing at home when discharged. Will also reach out to Dr. Katragadda at [facility] about patient's upcoming discharge soon and when to restart oral chemo.   LOS: 14 days    Suellyn Emory, MD 05/30/2023, 7:21 AM

## 2023-05-30 NOTE — TOC Progression Note (Signed)
 Transition of Care [facility]) - Progression Note    Patient Details  Name: Rachel Carlson MRN: [id number] Date of Birth: 06-27-1945  [facility]) CM/SW Contact  Kathryn Parish, RN Phone Number: 05/30/2023, 1:59 PM  Clinical Narrative:    NM spoke with patient at bedside to discuss palliative care agencies. Patient ask that NCM place referral with [facility] (formerly [facility]) [phone]. Referral placed spoke with [place].   Expected Discharge Plan: Home/Self Care Barriers to Discharge: Continued Medical Work up  Expected Discharge Plan and Services                                               Social Determinants of Health (SDOH) Interventions SDOH Screenings   Food Insecurity: No Food Insecurity (05/16/2023)  Housing: Low Risk  (05/16/2023)  Transportation Needs: No Transportation Needs (05/16/2023)  Utilities: Not At Risk (05/16/2023)  Alcohol  Screen: Low Risk  (08/28/2022)  Depression (PHQ2-9): Low Risk  (08/28/2022)  Financial Resource Strain: Low Risk  (08/28/2022)  Physical Activity: Inactive (08/28/2022)  Social Connections: Moderately Integrated (05/16/2023)  Stress: No Stress Concern Present (08/28/2022)  Tobacco Use: Low Risk  (05/16/2023)  Health Literacy: Adequate Health Literacy (08/28/2022)    Readmission Risk Interventions    05/17/2023    1:19 PM 01/03/2023    2:59 PM  Readmission Risk Prevention Plan  Transportation Screening Complete Complete  PCP or Specialist Appt within 5-7 Days  Complete  PCP or Specialist Appt within 3-5 Days Complete   Home Care Screening  Complete  Medication Review (RN CM)  Complete  HRI or Home Care Consult Complete   Social Work Consult for Recovery Care Planning/Counseling Complete   Palliative Care Screening Not Applicable   Medication Review [employer]) Complete

## 2023-05-31 DIAGNOSIS — K56600 Partial intestinal obstruction, unspecified as to cause: Secondary | ICD-10-CM | POA: Diagnosis not present

## 2023-05-31 LAB — MAGNESIUM: Magnesium: 2 mg/dL (ref 1.7–2.4)

## 2023-05-31 LAB — COMPREHENSIVE METABOLIC PANEL WITH GFR
ALT: 38 U/L (ref 0–44)
AST: 32 U/L (ref 15–41)
Albumin: 3.1 g/dL — ABNORMAL LOW (ref 3.5–5.0)
Alkaline Phosphatase: 57 U/L (ref 38–126)
Anion gap: 9 (ref 5–15)
BUN: 25 mg/dL — ABNORMAL HIGH (ref 8–23)
CO2: 22 mmol/L (ref 22–32)
Calcium: 9.1 mg/dL (ref 8.9–10.3)
Chloride: 104 mmol/L (ref 98–111)
Creatinine, Ser: 0.51 mg/dL (ref 0.44–1.00)
GFR, Estimated: >60 mL/min
Glucose, Bld: 150 mg/dL — ABNORMAL HIGH (ref 70–99)
Potassium: 4.1 mmol/L (ref 3.5–5.1)
Sodium: 135 mmol/L (ref 135–145)
Total Bilirubin: 0.5 mg/dL (ref 0.0–1.2)
Total Protein: 6.2 g/dL — ABNORMAL LOW (ref 6.5–8.1)

## 2023-05-31 LAB — GLUCOSE, CAPILLARY
Glucose-Capillary: 146 mg/dL — ABNORMAL HIGH (ref 70–99)
Glucose-Capillary: 141 mg/dL — ABNORMAL HIGH (ref 70–99)
Glucose-Capillary: 119 mg/dL — ABNORMAL HIGH (ref 70–99)
Glucose-Capillary: 144 mg/dL — ABNORMAL HIGH (ref 70–99)
Glucose-Capillary: 170 mg/dL — ABNORMAL HIGH (ref 70–99)

## 2023-05-31 LAB — CBC WITH DIFFERENTIAL/PLATELET
Abs Immature Granulocytes: 0.09 10*3/uL — ABNORMAL HIGH (ref 0.00–0.07)
Basophils Absolute: 0 10*3/uL (ref 0.0–0.1)
Basophils Relative: 0 %
Eosinophils Absolute: 0 10*3/uL (ref 0.0–0.5)
Eosinophils Relative: 0 %
HCT: 34 % — ABNORMAL LOW (ref 36.0–46.0)
Hemoglobin: 10.6 g/dL — ABNORMAL LOW (ref 12.0–15.0)
Immature Granulocytes: 1 %
Lymphocytes Relative: 12 %
Lymphs Abs: 0.8 10*3/uL (ref 0.7–4.0)
MCH: 30.9 pg (ref 26.0–34.0)
MCHC: 31.2 g/dL (ref 30.0–36.0)
MCV: 99.1 fL (ref 80.0–100.0)
Monocytes Absolute: 0.4 10*3/uL (ref 0.1–1.0)
Monocytes Relative: 6 %
Neutro Abs: 5.5 10*3/uL (ref 1.7–7.7)
Neutrophils Relative %: 81 %
Platelets: 256 10*3/uL (ref 150–400)
RBC: 3.43 MIL/uL — ABNORMAL LOW (ref 3.87–5.11)
RDW: 17 % — ABNORMAL HIGH (ref 11.5–15.5)
WBC: 6.9 10*3/uL (ref 4.0–10.5)
nRBC: 0 % (ref 0.0–0.2)

## 2023-05-31 LAB — PHOSPHORUS: Phosphorus: 3.8 mg/dL (ref 2.5–4.6)

## 2023-05-31 MED ORDER — INSULIN ASPART 100 UNIT/ML IJ SOLN
0.0000 [IU] | Freq: Four times a day (QID) | INTRAMUSCULAR | Status: DC
  Administered 2023-05-31 – 2023-06-01 (×2): 1 [IU] via SUBCUTANEOUS

## 2023-05-31 NOTE — Plan of Care (Signed)

## 2023-05-31 NOTE — Progress Notes (Signed)
 PROGRESS NOTE    Rachel Carlson  ZOX:[id number] DOB: [birth year] DOA: 05/16/2023 PCP: Clemens Curt, MD  Chief Complaint  Patient presents with   Emesis    Brief Narrative:   78 year old female with hx stage IV B primary peritoneal carcinoma s/p ex lap with tumor debulking, total omentectomy, SBR with 3 anastomosis, resection of ovary and rectosigmoid and multiple other medical issues here with SBO.    Assessment & Plan:   Principal Problem:   Partial small bowel obstruction (HCC) Active Problems:   Hyperlipidemia   GERD (gastroesophageal reflux disease)   Prediabetes   Essential hypertension   Iron deficiency anemia   Hypokalemia   Nausea and vomiting   Primary peritoneal adenocarcinoma (HCC)   Dysuria  Goals of care Appreciate palliative involvement Plan for outpatient palliative to follow  SBO Peritoneal carcinomatosis  hx Stage IVB Primary Peritoneal Carcinoma History of ex lap with radical tumor debulking surgery discharge various resections and colostomy 11/2022 CT 4/17 with early or intermittent SBO with transition at the level of prior ileocolic anastomosis Appreciate surgery assistance - last note 4/29, they note surgical intervention difficult and higher risk due to chemo and peritoneal carcinomatosis Appreciate gyn onc assistance - considering G tube, but sounds like not yet.  Tolerating soft diet.  If tolerates liquids/soft food, plan to d/c home as long as she's having ostomy output.  If she fails diet advancement or has recurrent symptoms, she'd need G tube placement for symptom management.  Steroids per gyn onc with concern for malignant pSBO - will taper at discharge Tpn d/c'd Plan to resume Eliquis  on discharge as per GYN oncology Per [facility] patient with difficult and higher risks for surgical intervention Hold oral chemo until additional recs from oncology (follow up with Dr. Cheree Cords) Hopeful discharge home 5/3  Bilateral Effusions CXR with  small left effusion   Hypokalemia Hyponatremia: Resolved.   Painful swallowing Throat pain: Likely in the setting of NG tube insertion/trauma-much improved following NG tube removal.     Hypertension: BP stable. PTA not taking metoprolol -discontinued.      DVT prophylaxis: lovenox  Code Status: DNR Family Communication: none Disposition:   Status is: Inpatient Remains inpatient appropriate because: pending surgical clearance   Consultants:  Gyn onc Surgery palliative  Procedures:  none  Antimicrobials:  Anti-infectives (From admission, onward)    None       Subjective: Feeling better  Objective: Vitals:   05/30/23 2001 05/30/23 2003 05/31/23 0414 05/31/23 1310  BP: 128/69   137/75  Pulse: 84   80  Resp: 18   16  Temp:  98.7 F (37.1 C)  98.4 F (36.9 C)  TempSrc:  Oral  Oral  SpO2: 96%   97%  Weight:   72.7 kg   Height:        Intake/Output Summary (Last 24 hours) at 05/31/2023 1556 Last data filed at 05/31/2023 1523 Gross per 24 hour  Intake 1000 ml  Output 450 ml  Net 550 ml   Filed Weights   05/26/23 0542 05/27/23 0002 05/31/23 0414  Weight: 68.3 kg 67.7 kg 72.7 kg    Examination:  General: No acute distress. Cardiovascular: RRR Lungs: unlabored Abdomen: Soft, nontender, nondistended - ostomy with brown stool Neurological: Alert and oriented 3. Moves all extremities 4 with equal strength. Cranial nerves II through XII grossly intact. Extremities: No clubbing or cyanosis. No edema.    Data Reviewed: I have personally reviewed following labs and imaging studies  CBC: Recent Labs  Lab 05/26/23 1138 05/27/23 0557 05/28/23 0415 05/29/23 0433 05/31/23 0239  WBC 8.7 7.9 8.2 7.6 6.9  NEUTROABS  --   --   --   --  5.5  HGB 11.5* 11.0* 10.7* 10.7* 10.6*  HCT 35.5* 34.1* 33.1* 33.7* 34.0*  MCV 96.7 98.3 98.2 97.7 99.1  PLT 314 310 296 264 256    Basic Metabolic Panel: Recent Labs  Lab 05/25/23 0728 05/26/23 1138 05/27/23 0557  05/28/23 0415 05/29/23 0433 05/30/23 0433 05/31/23 0239  NA 136   < > 133* 134* 135 133* 135  K 4.1   < > 4.0 4.1 4.2 4.1 4.1  CL 101   < > 101 105 103 102 104  CO2 26   < > 23 22 22  20* 22  GLUCOSE 153*   < > 174* 172* 180* 179* 150*  BUN 19   < > 20 20 22  24* 25*  CREATININE 0.56   < > 0.55 0.51 <0.30* 0.51 0.51  CALCIUM  9.4   < > 9.2 9.2 9.0 9.0 9.1  MG 2.0  --  2.2  --  2.0 2.1 2.0  PHOS 4.0  --  3.6  --  3.3 3.3 3.8   < > = values in this interval not displayed.    GFR: Estimated Creatinine Clearance: 56.7 mL/min (by C-G formula based on SCr of 0.51 mg/dL).  Liver Function Tests: Recent Labs  Lab 05/27/23 0557 05/30/23 0433 05/31/23 0239  AST 15 19 32  ALT 14 24 38  ALKPHOS 59 55 57  BILITOT 0.4 0.4 0.5  PROT 6.5 6.1* 6.2*  ALBUMIN  3.2* 3.1* 3.1*    CBG: Recent Labs  Lab 05/30/23 1959 05/30/23 2359 05/31/23 0405 05/31/23 0718 05/31/23 1115  GLUCAP 234* 150* 146* 141* 119*     No results found for this or any previous visit (from the past 240 hours).       Radiology Studies: DG CHEST PORT 1 VIEW Result Date: 05/30/2023 CLINICAL DATA:  Pleural effusion. EXAM: PORTABLE CHEST 1 VIEW COMPARISON:  03/29/2023 FINDINGS: Increased blunting at the left costophrenic angle is suggestive for [name]. Slightly prominent lung markings appear chronic. No significant right pleural effusion. Retrocardiac opacity is compatible with [name]. Right jugular [name] tip is near the superior cavoatrial junction. [name] CT injectable port. Trachea is midline. IMPRESSION: 1. Blunting at the left costophrenic angle is suggestive for [name]. 2. Hiatal hernia. Electronically Signed   By: [name] M.D.   On: 05/30/2023 09:22        Scheduled Meds:  vitamin C   1,000 mg Oral Daily   Chlorhexidine  Gluconate Cloth  6 each Topical Daily   dexamethasone  (DECADRON ) injection  6 mg Intravenous Q24H   enoxaparin  (LOVENOX )  injection  40 mg Subcutaneous Q24H   feeding supplement  237 mL Oral TID BM   insulin  aspart  0-15 Units Subcutaneous Q6H   pantoprazole   40 mg Oral Daily   polyethylene glycol  34 g Oral BID   rosuvastatin   20 mg Oral QHS   senna  1 tablet Oral BID   Continuous Infusions:     LOS: 15 days    Time spent: over 30 min    Donnetta Gains, MD [facility]   To contact the attending provider between 7A-7P or the covering provider during after hours 7P-7A, please log into the web site [email or web address] and access using universal [place] password for that  web site. If you do not have the password, please call the hospital operator.  05/31/2023, 3:56 PM

## 2023-05-31 NOTE — Progress Notes (Signed)
 Gynecologic Oncology Progress Note   Subjective: Patient reports doing well this am. Continues to tolerate soft diet with no nausea or emesis. Had 400 cc from ostomy yesterday with flatus. Has had soft output this am. No significant abdominal cramping reported. Voiding without difficulty. Ambulating in the halls frequently throughout the day.   Objective: Vital signs in last 24 hours: Temp:  [97.4 F (36.3 C)-98.7 F (37.1 C)] 98.7 F (37.1 C) (05/01 2003) Pulse Rate:  [79-84] 84 (05/01 2001) Resp:  [16-18] 18 (05/01 2001) BP: (112-128)/(69-73) 128/69 (05/01 2001) SpO2:  [96 %-98 %] 96 % (05/01 2001) Weight:  [160 lb 4.4 oz (72.7 kg)] 160 lb 4.4 oz (72.7 kg) (05/02 0414) Last BM Date : 05/30/23  Intake/Output from previous day: 05/01 0701 - 05/02 0700 In: 300 [P.O.:300] Out: 275 [Stool:275]  Physical Examination: General: alert, cooperative, and no distress Resp: clear to auscultation bilaterally Cardio: regular rate and rhythm, S1, S2 normal, no murmur, click, rub or gallop GI: active bowel sounds, abdomen soft, less distended in the upper abd, ostomy with moderate gas and soft stool, stoma non-edematous/stoma pink and viable Extremities: extremities normal, atraumatic, no cyanosis or edema Right chest port accessed with intact dressing with no surrounding erythema   Labs:    Latest Ref Rng & Units 05/31/2023    2:39 AM 05/29/2023    4:33 AM 05/28/2023    4:15 AM  CBC  WBC 4.0 - 10.5 K/uL 6.9  7.6  8.2   Hemoglobin 12.0 - 15.0 g/dL 40.9  81.1  91.4   Hematocrit 36.0 - 46.0 % 34.0  33.7  33.1   Platelets 150 - 400 K/uL 256  264  296       Latest Ref Rng & Units 05/31/2023    2:39 AM 05/30/2023    4:33 AM 05/29/2023    4:33 AM  BMP  Glucose 70 - 99 mg/dL 782  956  213   BUN 8 - 23 mg/dL 25  24  22    Creatinine 0.44 - 1.00 mg/dL 0.86  5.78  <4.69   Sodium 135 - 145 mmol/L 135  133  135   Potassium 3.5 - 5.1 mmol/L 4.1  4.1  4.2   Chloride 98 - 111 mmol/L 104  102  103    CO2 22 - 32 mmol/L 22  20  22    Calcium  8.9 - 10.3 mg/dL 9.1  9.0  9.0    07/28/5282 KUB: Improvement in the degree of small-bowel dilatation.   Assessment: 78 y.o. admitted with partial small bowel obstruction with transition at the level of prior ileocolic anastomosis. 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 Now s/p 4 cycles of adj C/T (last on 03/25/23). She was currently on Zejula  prior to admission.   Partial SBO: Gen Surg input appreciated. Currently on soft diet and tolerating this well. TPN discontinued 05/30/2023.   Stoma prolapse: Stoma healthy--minimal prolapse when she remains recumbent.   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 Prophylactic lovenox  during hospital stay, can restart Eliquis  on discharge.  Per [place] Pharmacist for decadron  at discharge, tapering down by 2 mg every 4-7 days is a good taper option since dexamethasone  is pretty strong, so can do 4 mg and then taper down to 2 mg after that.   EPIC message sent to  Dr. Katragadda at [facility] about patient's upcoming discharge soon and when to restart oral chemo. She has follow up with his office on 06/10/23.   LOS: 15 days    Suellyn Emory, MD 05/31/2023, 10:04 AM

## 2023-06-01 ENCOUNTER — Other Ambulatory Visit (HOSPITAL_COMMUNITY): Payer: Self-pay

## 2023-06-01 DIAGNOSIS — K56600 Partial intestinal obstruction, unspecified as to cause: Secondary | ICD-10-CM | POA: Diagnosis not present

## 2023-06-01 LAB — COMPREHENSIVE METABOLIC PANEL WITH GFR
ALT: 32 U/L (ref 0–44)
AST: 21 U/L (ref 15–41)
Albumin: 3.2 g/dL — ABNORMAL LOW (ref 3.5–5.0)
Alkaline Phosphatase: 57 U/L (ref 38–126)
Anion gap: 10 (ref 5–15)
BUN: 27 mg/dL — ABNORMAL HIGH (ref 8–23)
CO2: 23 mmol/L (ref 22–32)
Calcium: 9.3 mg/dL (ref 8.9–10.3)
Chloride: 101 mmol/L (ref 98–111)
Creatinine, Ser: 0.62 mg/dL (ref 0.44–1.00)
GFR, Estimated: >60 mL/min (ref >60)
Glucose, Bld: 158 mg/dL — ABNORMAL HIGH (ref 70–99)
Potassium: 4 mmol/L (ref 3.5–5.1)
Sodium: 134 mmol/L — ABNORMAL LOW (ref 135–145)
Total Bilirubin: 0.5 mg/dL (ref 0.0–1.2)
Total Protein: 6.3 g/dL — ABNORMAL LOW (ref 6.5–8.1)

## 2023-06-01 LAB — CBC WITH DIFFERENTIAL/PLATELET
Abs Immature Granulocytes: 0.08 10*3/uL — ABNORMAL HIGH (ref 0.00–0.07)
Basophils Absolute: 0 10*3/uL (ref 0.0–0.1)
Basophils Relative: 0 %
Eosinophils Absolute: 0 10*3/uL (ref 0.0–0.5)
Eosinophils Relative: 0 %
HCT: 33 % — ABNORMAL LOW (ref 36.0–46.0)
Hemoglobin: 10.9 g/dL — ABNORMAL LOW (ref 12.0–15.0)
Immature Granulocytes: 1 %
Lymphocytes Relative: 13 %
Lymphs Abs: 0.9 10*3/uL (ref 0.7–4.0)
MCH: 31.4 pg (ref 26.0–34.0)
MCHC: 33 g/dL (ref 30.0–36.0)
MCV: 95.1 fL (ref 80.0–100.0)
Monocytes Absolute: 0.4 10*3/uL (ref 0.1–1.0)
Monocytes Relative: 6 %
Neutro Abs: 5.2 10*3/uL (ref 1.7–7.7)
Neutrophils Relative %: 80 %
Platelets: 260 10*3/uL (ref 150–400)
RBC: 3.47 MIL/uL — ABNORMAL LOW (ref 3.87–5.11)
RDW: 16.8 % — ABNORMAL HIGH (ref 11.5–15.5)
WBC: 6.5 10*3/uL (ref 4.0–10.5)
nRBC: 0 % (ref 0.0–0.2)

## 2023-06-01 LAB — GLUCOSE, CAPILLARY
Glucose-Capillary: 123 mg/dL — ABNORMAL HIGH (ref 70–99)
Glucose-Capillary: 125 mg/dL — ABNORMAL HIGH (ref 70–99)

## 2023-06-01 LAB — MAGNESIUM: Magnesium: 2.1 mg/dL (ref 1.7–2.4)

## 2023-06-01 LAB — PHOSPHORUS: Phosphorus: 4.2 mg/dL (ref 2.5–4.6)

## 2023-06-01 MED ORDER — HEPARIN SOD (PORK) LOCK FLUSH 100 UNIT/ML IV SOLN
500.0000 [IU] | INTRAVENOUS | Status: AC | PRN
  Administered 2023-06-01: 500 [IU]

## 2023-06-01 MED ORDER — DEXAMETHASONE 2 MG PO TABS
ORAL_TABLET | ORAL | 0 refills | Status: AC
Start: 2023-06-01 — End: 2023-06-09
  Filled 2023-06-01: qty 12, 8d supply, fill #0

## 2023-06-01 NOTE — Discharge Summary (Signed)
 Physician Discharge Summary  Rachel Carlson BJY:[id number] DOB: Apr 20, 1945 DOA: 05/16/2023  PCP: Rachel Curt, MD  Admit date: 05/16/2023 Discharge date: 06/01/2023  Time spent: 40 minutes  Recommendations for Outpatient Follow-up:  Follow outpatient CBC/CMP  Continue soft diet Follow with oncology regarding resumption of oral chemo Follow pleural effusion outpatient   Discharge Diagnoses:  Principal Problem:   Partial small bowel obstruction (HCC) Active Problems:   Hyperlipidemia   GERD (gastroesophageal reflux disease)   Prediabetes   Essential hypertension   Iron deficiency anemia   Hypokalemia   Nausea and vomiting   Primary peritoneal adenocarcinoma (HCC)   Dysuria   Discharge Condition: stable  Diet recommendation: soft  Filed Weights   05/26/23 0542 05/27/23 0002 05/31/23 0414  Weight: 68.3 kg 67.7 kg 72.7 kg    History of present illness:   78 year old female with hx stage IV B primary peritoneal carcinoma s/p ex lap with tumor debulking, total omentectomy, SBR with 3 anastomosis, resection of ovary and rectosigmoid and multiple other medical issues here with SBO.   Improved with steroids and conservative management.   Discharging home on soft diet.   Hospital Course:  Assessment and Plan:  Goals of care Appreciate palliative involvement Plan for outpatient palliative to follow   SBO Peritoneal carcinomatosis  hx Stage IVB Primary Peritoneal Carcinoma History of ex lap with radical tumor debulking surgery discharge various resections and colostomy 11/2022 CT 4/17 with early or intermittent SBO with transition at the level of prior ileocolic anastomosis Appreciate surgery assistance - last note 4/29, they note surgical intervention difficult and higher risk due to chemo and peritoneal carcinomatosis Appreciate gyn onc assistance - considering G tube, but sounds like not yet.  Tolerating soft diet.  If tolerates liquids/soft food, plan to d/c home  as long as she's having ostomy output.  If she fails diet advancement or has recurrent symptoms, she'd need G tube placement for symptom management.  Steroids per gyn onc with concern for malignant pSBO - will taper at discharge Tpn d/c'd Resume eliquis  at discharge Per [facility] patient with difficult and higher risks for surgical intervention Hold oral chemo until additional recs from oncology (follow up with Dr. Cheree Carlson) Hopeful discharge home 5/3   Bilateral Effusions CXR with small left effusion   Hx DVT Eliquis   Hypokalemia Hyponatremia: Resolved.   Painful swallowing Throat pain: Likely in the setting of NG tube insertion/trauma-much improved following NG tube removal.     Hypertension: BP stable. PTA not taking metoprolol -discontinued.       Procedures: none   Consultations: Gyn onc Surgery palliative  Discharge Exam: Vitals:   06/01/23 0439 06/01/23 1255  BP: 109/66 112/68  Pulse: (!) 57 80  Resp: 18   Temp: 97.6 F (36.4 C) 98 F (36.7 C)  SpO2: 98% 98%   She was hoping to discharge tomorrow No complaints Encouraged her that discharge today is safe and no contraindications to home today  General: No acute distress. Cardiovascular: RRR Lungs: unlabored Abdomen: Soft, nontender, nondistended - ostomy with liquid brown stool Neurological: Alert and oriented 3. Moves all extremities 4 with equal strength. Cranial nerves II through XII grossly intact. Skin: Warm and dry. No rashes or lesions. Extremities: No clubbing or cyanosis. No edema.   Discharge Instructions   Discharge Instructions     Call MD for:  difficulty breathing, headache or visual disturbances   Complete by: As directed    Call MD for:  extreme fatigue   Complete by: As  directed    Call MD for:  hives   Complete by: As directed    Call MD for:  persistant dizziness or light-headedness   Complete by: As directed    Call MD for:  persistant nausea and vomiting   Complete by: As  directed    Call MD for:  redness, tenderness, or signs of infection (pain, swelling, redness, odor or green/yellow discharge around incision site)   Complete by: As directed    Call MD for:  severe uncontrolled pain   Complete by: As directed    Call MD for:  temperature >100.4   Complete by: As directed    Diet - low sodium heart healthy   Complete by: As directed    Discharge instructions   Complete by: As directed    You were seen for Rachel Carlson small bowel obstruction.  You've improved with conservative management.  We placed you on steroids to help with any inflammation.  Continue Rachel Carlson soft diet.    If you have recurrent symptoms, they'll need to consider placing Rachel Carlson g tube.  Hold your oral chemo until you get instructions from Dr. Cheree Carlson.  Return for new, recurrent, or worsening symptoms.  Please ask your PCP to request records from this hospitalization so they know what was done and what the next steps will be.   Increase activity slowly   Complete by: As directed       Allergies as of 06/01/2023       Reactions   Aspartame    Intolerance to artifical sweeteners, pt states caused burning of bladder   Levaquin  [levofloxacin ] Nausea Only   Pantoprazole  Sodium Diarrhea   Abdominal pain   Ranitidine Diarrhea   abdominal pain   Omeprazole  Diarrhea   Abdominal pain Other Reaction(s): Not available   Sulfasalazine  Rash, Other (See Comments)   Fever, chills, headache, muscle pain also        Medication List     PAUSE taking these medications    Zejula  200 MG tablet Wait to take this until your doctor or other care provider tells you to start again. Resume when oncology says it's ok Generic drug: niraparib  tosylate Take 1 tablet (200 mg total) by mouth daily. May take at bedtime to reduce nausea and vomiting.       TAKE these medications    calcium  citrate-vitamin D  315-200 MG-UNIT tablet Commonly known as: CITRACAL+D Take 1 tablet by mouth 2 (two) times daily.    Co Q-10 200 MG Caps Take 200 mg by mouth daily.   CRANBERRY PO Take 650 mg by mouth daily.   cyanocobalamin  1000 MCG tablet Commonly known as: VITAMIN B12 Take 1,000 mcg by mouth daily.   dexamethasone  2 MG tablet Commonly known as: DECADRON  Take 2 tablets (4 mg total) by mouth daily for 4 days, THEN 1 tablet (2 mg total) daily for 4 days. Start taking on: Jun 01, 2023   Eliquis  5 MG Tabs tablet Generic drug: apixaban  Take 1 tablet by mouth twice daily   FISH OIL TRIPLE STRENGTH PO Take 1 capsule by mouth daily.   GLUCOSAMINE CHONDR COMPLEX PO Take 2 capsules by mouth daily.   GRAPE SEED EXTRACT PO Take 1 capsule by mouth daily.   ketoconazole  2 % cream Commonly known as: NIZORAL  Apply 1 Application topically daily. To yeast rash What changed:  when to take this reasons to take this   lidocaine -prilocaine  cream Commonly known as: EMLA  Apply 1 Application topically as needed.  meclizine  25 MG tablet Commonly known as: ANTIVERT  TAKE 1 TABLET BY MOUTH THREE TIMES DAILY AS NEEDED FOR NAUSEA OR DIZZINESS What changed: See the new instructions.   milk thistle 175 MG tablet Take 175 mg by mouth daily.   ondansetron  8 MG tablet Commonly known as: ZOFRAN  Take 8 mg by mouth every 8 (eight) hours as needed for nausea or vomiting.   Prevacid  24HR 15 MG capsule Generic drug: lansoprazole  Take 15 mg by mouth See admin instructions. Take 15 mg by mouth in the morning before any food and an additional 15 mg once Giovana Faciane day as needed for unresolved reflux   prochlorperazine  10 MG tablet Commonly known as: COMPAZINE  Take 10 mg by mouth every 6 (six) hours as needed for nausea or vomiting.   rosuvastatin  20 MG tablet Commonly known as: CRESTOR  Take 1 tablet (20 mg total) by mouth at bedtime.   SYSTANE OP Place 1 drop into both eyes 4 (four) times daily as needed (for dryness).   temazepam  30 MG capsule Commonly known as: RESTORIL  Take 1 capsule (30 mg total) by mouth at  bedtime as needed for sleep.   vitamin C  1000 MG tablet Take 1,000 mg by mouth daily.   Vitamin D -3 125 MCG (5000 UT) Tabs Take 5,000 Units by mouth daily.   zinc gluconate 50 MG tablet Take 50 mg by mouth daily.       Allergies  Allergen Reactions   Aspartame     Intolerance to artifical sweeteners, pt states caused burning of bladder   Levaquin  [Levofloxacin ] Nausea Only   Pantoprazole  Sodium Diarrhea    Abdominal pain   Ranitidine Diarrhea    abdominal pain   Omeprazole  Diarrhea    Abdominal pain  Other Reaction(s): Not available   Sulfasalazine  Rash and Other (See Comments)    Fever, chills, headache, muscle pain also    Follow-up Information     Melvenia Stabs, MD Follow up on 07/01/2023.   Specialties: General Surgery, Colon and Rectal Surgery Why: Your appointment with Dr. Camilo Cella has been moved to June 2 at 11:20am with plan to arrive at 10:50 am. Contact information: [address] [place] [address] [phone]         Arma Lamp, MD Follow up on 06/13/2023.   Specialty: Obstetrics and Gynecology Why: at 10 am. Contact information: [address] [place] [address] [phone]         [facility] Follow up.   Specialty: Hospice Why: "[facility]" will call. Contact information: [address] [place]  [address] [phone]                 The results of significant diagnostics from this hospitalization (including imaging, microbiology, ancillary and laboratory) are listed below for reference.    Significant Diagnostic Studies: DG CHEST PORT 1 VIEW Result Date: 05/30/2023 CLINICAL DATA:  Pleural effusion. EXAM: PORTABLE CHEST 1 VIEW COMPARISON:  03/29/2023 FINDINGS: Increased blunting at the left costophrenic angle is suggestive for Eusevio Schriver small left pleural effusion. Slightly prominent lung markings appear chronic. No significant right pleural  effusion. Retrocardiac opacity is compatible with Dianne Bady hiatal hernia. Right jugular Port-Dream Nodal-Cath tip is near the superior cavoatrial junction. This is Maury Bamba CT injectable port. Trachea is midline. IMPRESSION: 1. Blunting at the left costophrenic angle is suggestive for Natallie Ravenscroft small left pleural effusion. 2. Hiatal hernia. Electronically Signed   By: [name] M.D.   On: 05/30/2023 09:22  DG Abd 1 View Result Date: 05/26/2023 CLINICAL DATA:  Recent vomiting EXAM: ABDOMEN - 1 VIEW COMPARISON:  05/23/2023 FINDINGS: Scattered large and small bowel gas is noted. Decreased small bowel dilatation is noted when compare with the prior exam. Anivea Velasques single mildly dilated loop of small bowel is seen. No free air is noted. No bony abnormality is noted. IMPRESSION: Improvement in the degree of small-bowel dilatation. Electronically Signed   By: Violeta Grey M.D.   On: 05/26/2023 10:48   DG Abd Portable 1V Result Date: 05/23/2023 CLINICAL DATA:  Follow up small bowel obstruction EXAM: PORTABLE ABDOMEN - 1 VIEW COMPARISON:  05/22/2023 FINDINGS: Scattered large and small bowel gas is noted. Persistent small bowel dilatation is noted stable from the previous day. No free air is seen. No bony abnormality is noted. IMPRESSION: Persistent small bowel dilatation stable from the previous day. Electronically Signed   By: Violeta Grey M.D.   On: 05/23/2023 09:48   DG Abd Portable 1V Result Date: 05/22/2023 CLINICAL DATA:  Follow up small bowel obstruction EXAM: PORTABLE ABDOMEN - 1 VIEW COMPARISON:  05/18/2023 FINDINGS: Previously seen colonic contrast has cleared. Persistent small bowel dilatation is noted consistent with the given clinical history of partial small bowel obstruction. No free air is noted. No bony abnormality is seen. IMPRESSION: Persistent small bowel dilatation. Electronically Signed   By: Violeta Grey M.D.   On: 05/22/2023 10:54   DG Abd Portable 1V-Small Bowel Obstruction Protocol-initial, 8 hr delay Result Date:  05/18/2023 CLINICAL DATA:  Small-bowel follow-through. EXAM: PORTABLE ABDOMEN - 1 VIEW COMPARISON:  May 17, 2023 (5:19 Terin Cragle.m.) FINDINGS: Sharea Guinther kinked enteric tube is seen with its distal tip overlying the expected region of the gastroesophageal junction. This is unchanged in appearance when compared to the prior study. Persistent dilated, air distended loops of small bowel are seen within the mid abdomen. Radiopaque contrast is also seen within the ascending and descending colon. No radio-opaque calculi are seen. IMPRESSION: Findings consistent with Wilfrid Hyser partial small bowel obstruction. Electronically Signed   By: Virgle Grime M.D.   On: 05/18/2023 22:10   DG Abd Portable 1V-Small Bowel Obstruction Protocol-initial, 8 hr delay Result Date: 05/17/2023 CLINICAL DATA:  Small-bowel follow-through. EXAM: PORTABLE ABDOMEN - 1 VIEW COMPARISON:  05/16/2023 FINDINGS: NG tube is folded back on itself in the patient's known hiatal hernia. Distal tip of the NG tube is not been included on the film. Gaseous small bowel distention in the abdomen measures up to 4 cm diameter. There is evidence of gas and stool in the left colon without substantial colonic dilatation. No discernible enteric contrast within the small bowel or colon. IMPRESSION: 1. Gaseous small bowel distention in the abdomen measures up to 4 cm diameter. Imaging features are compatible with small bowel obstruction. 2. No discernible enteric contrast within the small bowel or colon. Patient is noted to have Zacary Bauer left abdominal colostomy on recent CT and there may be some contrast material in the colostomy bag overlying the midline low pelvis. Electronically Signed   By: Donnal Fusi M.D.   On: 05/17/2023 07:53   DG Abd Portable 1V Result Date: 05/16/2023 CLINICAL DATA:  Check gastric catheter placement EXAM: PORTABLE ABDOMEN - 1 VIEW COMPARISON:  None Available. FINDINGS: Scattered large and small bowel gas is noted. Gastric catheter is coiled within Mahmood Boehringer hiatal  hernia just above the esophageal hiatus. Chest wall port is noted. Lung bases demonstrate minimal atelectasis on the left. IMPRESSION: Gastric catheter coiled within Kashara Blocher hiatal hernia. Electronically  Signed   By: Violeta Grey M.D.   On: 05/16/2023 19:15   CT ABDOMEN PELVIS W CONTRAST Result Date: 05/16/2023 CLINICAL DATA:  Abdominal pain, vomiting, history of ovarian cancer and peritoneal carcinomatosis EXAM: CT ABDOMEN AND PELVIS WITH CONTRAST TECHNIQUE: Multidetector CT imaging of the abdomen and pelvis was performed using the standard protocol following bolus administration of intravenous contrast. RADIATION DOSE REDUCTION: This exam was performed according to the departmental dose-optimization program which includes automated exposure control, adjustment of the mA and/or kV according to patient size and/or use of iterative reconstruction technique. CONTRAST:  OMNIPAQUE  IOHEXOL  300 MG/ML  SOLN COMPARISON:  03/18/2023 FINDINGS: Lower chest: Large hiatal hernia. Bilateral pleural effusions, left greater than right. Stable left lower lobe atelectasis. Hepatobiliary: No focal liver abnormality is seen. No gallstones, gallbladder wall thickening, or biliary dilatation. Pancreas: Unremarkable. No pancreatic ductal dilatation or surrounding inflammatory changes. Spleen: Normal in size without focal abnormality. Adrenals/Urinary Tract: The adrenals are unremarkable. The kidneys enhance normally and symmetrically. No urinary tract calculi or obstructive uropathy. The bladder is decompressed, limiting its evaluation. Stomach/Bowel: Postsurgical changes from distal colon resection and diverting colostomy left upper quadrant. Evidence of prior small bowel resection with ileo colic anastomosis. There is moderate distension of the distal small bowel with scattered gas fluid levels, with jejunum measuring up to 3.1 cm in diameter. There is formed stool within the distal small bowel just proximal to the ileocolic  anastomosis. Findings are concerning for early or intermittent small bowel obstruction with transition at the site of ileocolic anastomosis. No bowel wall thickening or acute inflammatory changes. Large hiatal hernia as described above. Vascular/Lymphatic: Aortic atherosclerosis. No pathologic adenopathy within the abdomen or pelvis. Reproductive: Status post hysterectomy. No adnexal masses. Other: Trace free fluid within the right upper quadrant and lower pelvis. No free intraperitoneal gas. Stable postsurgical changes from midline laparotomy. Mild diffuse peritoneal thickening again identified, grossly stable. No distinct mesenteric nodules or masses. Musculoskeletal: No acute or destructive bony abnormalities. Reconstructed images demonstrate no additional findings. IMPRESSION: 1. Findings concerning for early or intermittent small bowel obstruction with transition at the level of prior ileocolic anastomosis. Continued radiographic follow-up recommended. 2. Bilateral pleural effusions, left greater than right, with stable left lower lobe atelectasis. 3. Stable mild diffuse peritoneal thickening without discrete nodularity or masses. 4. Trace abdominal and pelvic ascites. 5.  Aortic Atherosclerosis (ICD10-I70.0). 6. Stable large hiatal hernia. Electronically Signed   By: Bobbye Burrow M.D.   On: 05/16/2023 14:20    Microbiology: No results found for this or any previous visit (from the past 240 hours).   Labs: Basic Metabolic Panel: Recent Labs  Lab 05/27/23 0557 05/28/23 0415 05/29/23 0433 05/30/23 0433 05/31/23 0239 06/01/23 0401  NA 133* 134* 135 133* 135 134*  K 4.0 4.1 4.2 4.1 4.1 4.0  CL 101 105 103 102 104 101  CO2 23 22 22  20* 22 23  GLUCOSE 174* 172* 180* 179* 150* 158*  BUN 20 20 22  24* 25* 27*  CREATININE 0.55 0.51 <0.30* 0.51 0.51 0.62  CALCIUM  9.2 9.2 9.0 9.0 9.1 9.3  MG 2.2  --  2.0 2.1 2.0 2.1  PHOS 3.6  --  3.3 3.3 3.8 4.2   Liver Function Tests: Recent Labs  Lab  05/27/23 0557 05/30/23 0433 05/31/23 0239 06/01/23 0401  AST 15 19 32 21  ALT 14 24 38 32  ALKPHOS 59 55 57 57  BILITOT 0.4 0.4 0.5 0.5  PROT 6.5 6.1* 6.2* 6.3*  ALBUMIN   3.2* 3.1* 3.1* 3.2*   No results for input(s): "LIPASE", "AMYLASE" in the last 168 hours. No results for input(s): "AMMONIA" in the last 168 hours. CBC: Recent Labs  Lab 05/27/23 0557 05/28/23 0415 05/29/23 0433 05/31/23 0239 06/01/23 0401  WBC 7.9 8.2 7.6 6.9 6.5  NEUTROABS  --   --   --  5.5 5.2  HGB 11.0* 10.7* 10.7* 10.6* 10.9*  HCT 34.1* 33.1* 33.7* 34.0* 33.0*  MCV 98.3 98.2 97.7 99.1 95.1  PLT 310 296 264 256 260   Cardiac Enzymes: No results for input(s): "CKTOTAL", "CKMB", "CKMBINDEX", "TROPONINI" in the last 168 hours. BNP: BNP (last 3 results) No results for input(s): "BNP" in the last 8760 hours.  ProBNP (last 3 results) No results for input(s): "PROBNP" in the last 8760 hours.  CBG: Recent Labs  Lab 05/31/23 1115 05/31/23 1728 05/31/23 2046 06/01/23 0730 06/01/23 1141  GLUCAP 119* 144* 170* 123* 125*       Signed:  Donnetta Gains MD.  [facility] 06/01/2023, 1:46 PM

## 2023-06-01 NOTE — Progress Notes (Signed)
 Mobility Specialist - Progress Note   06/01/23 1017  Mobility  Activity Ambulated independently in hallway  Level of Assistance Independent  Assistive Device None  Distance Ambulated (ft) 1050 ft  Activity Response Tolerated well  Mobility Referral Yes  Mobility visit 1 Mobility  Mobility Specialist Start Time (ACUTE ONLY) 1000  Mobility Specialist Stop Time (ACUTE ONLY) 1028  Mobility Specialist Time Calculation (min) (ACUTE ONLY) 28 min   Pt received in bed and agreeable to mobility. No complaints during session. Pt to bed after session with all needs met.    [facility]

## 2023-06-01 NOTE — Progress Notes (Signed)
 Gynecologic Oncology Progress Note   Subjective: Overall doing well. Tolerating soft diet. No nausea, vomiting. Has continued to have ostomy output with stool and gas. Pain well controled. Voiding. Ambulating.   Objective: Vital signs in last 24 hours: Temp:  [97.6 F (36.4 C)-98.5 F (36.9 C)] 97.6 F (36.4 C) (05/03 0439) Pulse Rate:  [57-88] 57 (05/03 0439) Resp:  [16-20] 18 (05/03 0439) BP: (109-137)/(66-75) 109/66 (05/03 0439) SpO2:  [97 %-98 %] 98 % (05/03 0439) Last BM Date : 05/31/23  Intake/Output from previous day: 05/02 0701 - 05/03 0700 In: 1180 [P.O.:1180] Out: 675 [Stool:675]  Physical Examination: General: alert, cooperative, and no distress Resp: Normal work of breathing GI: active bowel sounds, abdomen soft, less distended in the upper abd, ostomy with moderate gas and soft stool, stoma non-edematous/stoma pink and viable Extremities: extremities normal, atraumatic, no cyanosis or edema Right chest port accessed with intact dressing with no surrounding erythema   Labs:    Latest Ref Rng & Units 06/01/2023    4:01 AM 05/31/2023    2:39 AM 05/29/2023    4:33 AM  CBC  WBC 4.0 - 10.5 K/uL 6.5  6.9  7.6   Hemoglobin 12.0 - 15.0 g/dL 78.2  95.6  21.3   Hematocrit 36.0 - 46.0 % 33.0  34.0  33.7   Platelets 150 - 400 K/uL 260  256  264       Latest Ref Rng & Units 06/01/2023    4:01 AM 05/31/2023    2:39 AM 05/30/2023    4:33 AM  BMP  Glucose 70 - 99 mg/dL 086  578  469   BUN 8 - 23 mg/dL 27  25  24    Creatinine 0.44 - 1.00 mg/dL 6.29  5.28  4.13   Sodium 135 - 145 mmol/L 134  135  133   Potassium 3.5 - 5.1 mmol/L 4.0  4.1  4.1   Chloride 98 - 111 mmol/L 101  104  102   CO2 22 - 32 mmol/L 23  22  20    Calcium  8.9 - 10.3 mg/dL 9.3  9.1  9.0    2/44/0102 KUB: Improvement in the degree of small-bowel dilatation.   Assessment: 78 y.o. admitted with partial small bowel obstruction with transition at the level of prior ileocolic anastomosis. History includes Stage  IVB primary peritoneal carcinoma who is s/p 4 cycles NACT (C/T), IDS (11/2022 - exlap, BSO, rectosigmoid resection with end ostomy, omentectomy, small bowel resection). Now s/p 4 cycles of adj C/T (last on 03/25/23). She was currently on Zejula  prior to admission.   Partial SBO: Gen Surg input appreciated. Currently on soft diet and tolerating this well. TPN discontinued 05/30/2023.   Stoma prolapse: Stoma healthy--minimal prolapse when she remains recumbent.   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 Prophylactic lovenox  during hospital stay, can restart Eliquis  on discharge.  Per [place] Pharmacist for decadron  at discharge, tapering down by 2 mg every 4-7 days is a good taper option since dexamethasone  is pretty strong, so can do 4 mg and then taper down to 2 mg after that.   EPIC message sent to Dr. Cheree Cords at [facility] about patient's upcoming discharge soon and when to restart oral chemo. She has follow up with his office on 06/10/23. Patient instructed not to resume zejula  until follow-up with Dr. Cheree Cords.  Patient safe for discharge from [facility] perspective.   LOS: 16 days  Derrel Flies, MD 06/01/2023, 8:16 AM

## 2023-06-05 ENCOUNTER — Other Ambulatory Visit: Payer: Self-pay

## 2023-06-06 ENCOUNTER — Other Ambulatory Visit: Payer: Self-pay

## 2023-06-07 ENCOUNTER — Ambulatory Visit: Admitting: Internal Medicine

## 2023-06-07 ENCOUNTER — Encounter: Payer: Self-pay | Admitting: Internal Medicine

## 2023-06-07 VITALS — BP 112/68 | HR 74 | Ht 62.0 in | Wt 156.0 lb

## 2023-06-07 DIAGNOSIS — R195 Other fecal abnormalities: Secondary | ICD-10-CM

## 2023-06-07 DIAGNOSIS — K219 Gastro-esophageal reflux disease without esophagitis: Secondary | ICD-10-CM | POA: Diagnosis not present

## 2023-06-07 DIAGNOSIS — C482 Malignant neoplasm of peritoneum, unspecified: Secondary | ICD-10-CM

## 2023-06-07 DIAGNOSIS — K5130 Ulcerative (chronic) rectosigmoiditis without complications: Secondary | ICD-10-CM | POA: Diagnosis not present

## 2023-06-07 DIAGNOSIS — Z933 Colostomy status: Secondary | ICD-10-CM

## 2023-06-07 NOTE — Progress Notes (Signed)
 Rachel Carlson 78 y.o. November 24, 1945 [id number]  Assessment & Plan:   Encounter Diagnoses  Name Primary?   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 She thinks Decadron  is helping that currently. She will monitor and if it becomes a quality of life issue we could consider mesalamine  suppository, steroids. She is advised to call back prn. Continue Prevacid  for GERD    Subjective:   Chief Complaint: Follow-up of her ulcerative proctosigmoiditis.  HPI 78 year old woman with chronic ulcerative proctosigmoiditis,  stage IVb primary peritoneal carcinoma status post exploratory laparotomy with tumor debulking, total omentectomy, small bowel resection with 3 anastomoses, ovarian resection and resection of the rectosigmoid who was hospitalized with partial small bowel obstruction 4/17 to 06/01/2023.  She was discharged on Decadron .  I have reviewed those records including labs and imaging.  She feels improved and has a pending appointment with her oncologist Dr. Katragadda to discuss changing therapy.  She is concerned because of a mucus discharge from the rectum, status post left colostomy.  She had been followed in this clinic for her ulcerative proctosigmoiditis and started to have weight gain and edema and ascites and she had invasion of primary peritoneal adenocarcinoma into the sigmoid colon and underwent resection of that as described above.  Her last contact with GI was around the time of that diagnosis in the fall 2024.  She is not on any therapy for ulcerative proctosigmoiditis since the resection.  She has noted this intermittent mucus discharge no bleeding.  Sometimes the rectum or anal  area will spasm at times.  This is not frequent and not disabling and not a significant quality-of-life issue but it has made the patient wonder why she has this and could not be her proctosigmoiditis flaring.  She reports that since being on Decadron  this is less frequent.  During the discussion she did become tearful recounting all of the health problems she has encountered since last fall.  She reports Prevacid  is controlling her GERD.    CT abdomen and pelvis 05/16/2023 IMPRESSION: 1. Findings concerning for early or intermittent small bowel obstruction with transition at the level of prior ileocolic anastomosis. Continued radiographic follow-up recommended. 2. Bilateral pleural effusions, left greater than right, with stable left lower lobe atelectasis. 3. Stable mild diffuse peritoneal thickening without discrete nodularity or masses. 4. Trace abdominal and pelvic ascites. 5.  Aortic Atherosclerosis (ICD10-I70.0). 6. Stable large hiatal hernia.   Lab Results  Component Value Date   WBC 6.5 06/01/2023   HGB 10.9 (L) 06/01/2023   HCT 33.0 (L) 06/01/2023   MCV 95.1 06/01/2023   PLT 260 06/01/2023     CA 125 439 4/14 Allergies  Allergen Reactions   Aspartame     Intolerance to artifical sweeteners, pt states caused burning of bladder   Levaquin  [Levofloxacin ] Nausea Only   Pantoprazole  Sodium Diarrhea    Abdominal pain   Ranitidine Diarrhea    abdominal pain   Omeprazole  Diarrhea    Abdominal pain  Other Reaction(s): Not available   Sulfasalazine  Rash and Other (See Comments)    Fever, chills, headache, muscle pain also   Current Meds  Medication Sig   apixaban  (ELIQUIS ) 5 MG TABS tablet Take 1  tablet by mouth twice daily   dexamethasone  (DECADRON ) 2 MG tablet Take 2 tablets (4 mg total) by mouth daily for 4 days, THEN 1 tablet (2 mg total) daily for 4 days.   ketoconazole  (NIZORAL ) 2 % cream Apply 1 Application topically daily. To yeast rash (Patient taking  differently: Apply 1 Application topically as needed for irritation (yeast rash). To yeast rash)   lidocaine -prilocaine  (EMLA ) cream Apply 1 Application topically as needed.   meclizine  (ANTIVERT ) 25 MG tablet TAKE 1 TABLET BY MOUTH THREE TIMES DAILY AS NEEDED FOR NAUSEA OR DIZZINESS (Patient taking differently: Take 25 mg by mouth 3 (three) times daily as needed for nausea or dizziness.)   PREVACID  24HR 15 MG capsule Take 15 mg by mouth See admin instructions. Take 15 mg by mouth in the morning before any food and an additional 15 mg once a day as needed for unresolved reflux   prochlorperazine  (COMPAZINE ) 10 MG tablet Take 10 mg by mouth every 6 (six) hours as needed for nausea or vomiting.   rosuvastatin  (CRESTOR ) 20 MG tablet Take 1 tablet (20 mg total) by mouth at bedtime.   temazepam  (RESTORIL ) 30 MG capsule Take 1 capsule (30 mg total) by mouth at bedtime as needed for sleep.   vitamin B-12 (CYANOCOBALAMIN ) 1000 MCG tablet Take 1,000 mcg by mouth daily.   [DISCONTINUED] niraparib  tosylate (ZEJULA ) 200 MG tablet Take 1 tablet (200 mg total) by mouth daily. May take at bedtime to reduce nausea and vomiting.   Past Medical History:  Diagnosis Date   Blood transfusion without reported diagnosis    Chronic right ear pain    normal MRI   Diverticulosis    DVT of leg (deep venous thrombosis) (HCC)    Esophageal stricture    Family history of breast cancer    Family history of colon cancer    Family history of melanoma    Family history of prostate cancer    Fibroids 1998   uterine, history of (left ovaries)   GERD (gastroesophageal reflux disease)    History of hiatal hernia    History of uterine prolapse    Hx of adenomatous polyp of colon 10/19/2014   Hyperlipidemia    Hypertension    borderline no meds diet and exercise   Nausea and vomiting 05/19/2023   Neuromuscular disorder (HCC)    neuropathy from chemo   Obesity    Ovarian cancer (HCC)    Peritoneal carcinomatosis (HCC)     PVC (premature ventricular contraction)    first dx by holter in 1980's, echo (6/10) EF 55-60%, normal diastolic fxn, normal size RV and fxn, mild MR, PASP   Squamous cell carcinoma of arm    ovarian   Tubal pregnancy    Ulcerative proctosigmoiditis (HCC)    Urge urinary incontinence 06/03/2007   Qualifier: Diagnosis of  By: Lennice Quivers CMA, Kelly Patient     Past Surgical History:  Procedure Laterality Date   BILATERAL SALPINGECTOMY Bilateral 12/12/2022   Procedure: OPEN BILATERAL SALPINGECTOMY-OOPHORECTOMY;  Surgeon: Suzi Essex, MD;  Location: [facility];  Service: Gynecology;  Laterality: Bilateral;   BLADDER NECK SUSPENSION  1998   BREAST BIOPSY Left    BREAST LUMPECTOMY Left    benign   CATARACT EXTRACTION Right 01/2020   CATARACT EXTRACTION W/PHACO Left 08/12/2020   Procedure: CATARACT EXTRACTION PHACO AND INTRAOCULAR LENS PLACEMENT LEFT EYE;  Surgeon: Tarri Farm, MD;  Location: [facility];  Service: Ophthalmology;  Laterality: Left;  left CDE=10.71  COLONOSCOPY  05/18/2011/10/12/14   Dr. Loy Ruff   DEBULKING N/A 12/12/2022   Procedure: RADICAL TUMOR DEBULKING, PERITONEAL STRIPPING, MOBILIZATION SPLENIC FLEXURE, RECTOSIGMOID RESECTION,SMALL BOWEL RESECTION AND REANASTOMOSIS ,END TO END DESCENDING COLOSTOMY;  Surgeon: Suzi Essex, MD;  Location: [facility];  Service: Gynecology;  Laterality: N/A;   ECTOPIC PREGNANCY SURGERY  1980   ESOPHAGOGASTRODUODENOSCOPY (EGD) WITH ESOPHAGEAL DILATION  04/03/2012   Dr. Loy Ruff   INCISION AND DRAINAGE ABSCESS N/A 01/02/2023   Procedure: INCISION AND DRAINAGE OF ABDOMINAL WOUND SEROMA;  Surgeon: Abdul Hodgkin, MD;  Location: [facility];  Service: Gynecology;  Laterality: N/A;   IR IMAGING GUIDED PORT INSERTION  09/06/2022   OMENTECTOMY N/A 12/12/2022   Procedure: OMENTECTOMY;  Surgeon: Suzi Essex, MD;  Location: [facility];  Service: Gynecology;  Laterality: N/A;   PARTIAL HYSTERECTOMY     abdominal, fibroids, and  prolapse (1998) bladder tack   SALPINGOOPHORECTOMY Bilateral 12/12/2022   Procedure: DIAGNOSTIC LAPAROSCOPY;  Surgeon: Suzi Essex, MD;  Location: [facility];  Service: Gynecology;  Laterality: Bilateral;   SKIN CANCER EXCISION     UPPER GASTROINTESTINAL ENDOSCOPY     Social History   Social History Narrative   Retired International aid/development worker, 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)   Grown children   Never smoker no alcohol  tobacco or drug use   family history includes Arrhythmia in her brother; Barrett's esophagus in her maternal aunt and mother; Breast cancer in her sister; Colon cancer in her cousin and another family member; Colon cancer (age of onset: 21) in her son; Colon cancer (age of onset: 8) in her brother; Colon polyps in her sister; Congestive Heart Failure in her maternal grandmother; Coronary artery disease in her mother; Diabetes in her mother; Diabetes type II in her mother; Ehlers-Danlos syndrome in her daughter; Heart attack (age of onset: 16) in her mother; Melanoma in her maternal uncle; Other in her son; Prostate cancer in her maternal grandfather; Skin cancer in her maternal aunt and mother; Sudden death in her mother; Sudden death (age of onset: 56) in her father; Transient ischemic attack in her mother; Tuberculosis in her paternal grandfather.   Review of Systems As per HPI  Objective:   Physical Exam BP 112/68   Pulse 74   Ht 5\' 2"  (1.575 m)   Wt 156 lb (70.8 kg)   LMP 05/29/1996   BMI 28.53 kg/m  Abdomen - LLQ colostomy w/ brwn stool Soft and nontender

## 2023-06-07 NOTE — Patient Instructions (Signed)
 _______________________________________________________  If your blood pressure at your visit was 140/90 or greater, please contact your primary care physician to follow up on this.  _______________________________________________________  If you are age 76 or older, your body mass index should be between 23-30. Your Body mass index is 28.53 kg/m. If this is out of the aforementioned range listed, please consider follow up with your Primary Care Provider.  If you are age 44 or younger, your body mass index should be between 19-25. Your Body mass index is 28.53 kg/m. If this is out of the aformentioned range listed, please consider follow up with your Primary Care Provider.   ________________________________________________________  The [facility] providers would like to encourage you to use MYCHART to communicate with providers for non-urgent requests or questions.  Due to long hold times on the telephone, sending your provider a message by [facility] may be a faster and more efficient way to get a response.  Please allow 48 business hours for a response.  Please remember that this is for non-urgent requests.  _______________________________________________________  I appreciate the opportunity to care for you. Loy Ruff, MD, [employer]

## 2023-06-10 ENCOUNTER — Inpatient Hospital Stay: Admitting: Dietician

## 2023-06-10 ENCOUNTER — Inpatient Hospital Stay

## 2023-06-10 ENCOUNTER — Inpatient Hospital Stay: Attending: Hematology | Admitting: Hematology

## 2023-06-10 ENCOUNTER — Other Ambulatory Visit: Payer: Self-pay

## 2023-06-10 DIAGNOSIS — Z7901 Long term (current) use of anticoagulants: Secondary | ICD-10-CM | POA: Diagnosis not present

## 2023-06-10 DIAGNOSIS — C482 Malignant neoplasm of peritoneum, unspecified: Secondary | ICD-10-CM | POA: Insufficient documentation

## 2023-06-10 DIAGNOSIS — Z86718 Personal history of other venous thrombosis and embolism: Secondary | ICD-10-CM | POA: Diagnosis not present

## 2023-06-10 DIAGNOSIS — G629 Polyneuropathy, unspecified: Secondary | ICD-10-CM | POA: Insufficient documentation

## 2023-06-10 DIAGNOSIS — K56699 Other intestinal obstruction unspecified as to partial versus complete obstruction: Secondary | ICD-10-CM | POA: Insufficient documentation

## 2023-06-10 DIAGNOSIS — G4700 Insomnia, unspecified: Secondary | ICD-10-CM | POA: Diagnosis not present

## 2023-06-10 DIAGNOSIS — Z452 Encounter for adjustment and management of vascular access device: Secondary | ICD-10-CM | POA: Diagnosis not present

## 2023-06-10 DIAGNOSIS — K660 Peritoneal adhesions (postprocedural) (postinfection): Secondary | ICD-10-CM

## 2023-06-10 DIAGNOSIS — C786 Secondary malignant neoplasm of retroperitoneum and peritoneum: Secondary | ICD-10-CM

## 2023-06-10 DIAGNOSIS — J90 Pleural effusion, not elsewhere classified: Secondary | ICD-10-CM | POA: Diagnosis not present

## 2023-06-10 DIAGNOSIS — Z800 Family history of malignant neoplasm of digestive organs: Secondary | ICD-10-CM | POA: Insufficient documentation

## 2023-06-10 DIAGNOSIS — Z803 Family history of malignant neoplasm of breast: Secondary | ICD-10-CM | POA: Diagnosis not present

## 2023-06-10 DIAGNOSIS — C800 Disseminated malignant neoplasm, unspecified: Secondary | ICD-10-CM

## 2023-06-10 DIAGNOSIS — Z9221 Personal history of antineoplastic chemotherapy: Secondary | ICD-10-CM | POA: Diagnosis not present

## 2023-06-10 LAB — COMPREHENSIVE METABOLIC PANEL WITH GFR
ALT: 19 U/L (ref 0–44)
AST: 18 U/L (ref 15–41)
Albumin: 3.3 g/dL — ABNORMAL LOW (ref 3.5–5.0)
Alkaline Phosphatase: 52 U/L (ref 38–126)
Anion gap: 9 (ref 5–15)
BUN: 18 mg/dL (ref 8–23)
CO2: 23 mmol/L (ref 22–32)
Calcium: 9.2 mg/dL (ref 8.9–10.3)
Chloride: 102 mmol/L (ref 98–111)
Creatinine, Ser: 0.48 mg/dL (ref 0.44–1.00)
GFR, Estimated: >60 mL/min (ref >60)
Glucose, Bld: 88 mg/dL (ref 70–99)
Potassium: 3.7 mmol/L (ref 3.5–5.1)
Sodium: 134 mmol/L — ABNORMAL LOW (ref 135–145)
Total Bilirubin: 1.1 mg/dL (ref 0.0–1.2)
Total Protein: 6.2 g/dL — ABNORMAL LOW (ref 6.5–8.1)

## 2023-06-10 LAB — CBC WITH DIFFERENTIAL/PLATELET
Abs Immature Granulocytes: 0.03 10*3/uL (ref 0.00–0.07)
Basophils Absolute: 0 10*3/uL (ref 0.0–0.1)
Basophils Relative: 0 %
Eosinophils Absolute: 0.1 10*3/uL (ref 0.0–0.5)
Eosinophils Relative: 2 %
HCT: 33.7 % — ABNORMAL LOW (ref 36.0–46.0)
Hemoglobin: 11.3 g/dL — ABNORMAL LOW (ref 12.0–15.0)
Immature Granulocytes: 1 %
Lymphocytes Relative: 32 %
Lymphs Abs: 1.7 10*3/uL (ref 0.7–4.0)
MCH: 32.1 pg (ref 26.0–34.0)
MCHC: 33.5 g/dL (ref 30.0–36.0)
MCV: 95.7 fL (ref 80.0–100.0)
Monocytes Absolute: 0.4 10*3/uL (ref 0.1–1.0)
Monocytes Relative: 8 %
Neutro Abs: 3 10*3/uL (ref 1.7–7.7)
Neutrophils Relative %: 57 %
Platelets: 187 10*3/uL (ref 150–400)
RBC: 3.52 MIL/uL — ABNORMAL LOW (ref 3.87–5.11)
RDW: 17.2 % — ABNORMAL HIGH (ref 11.5–15.5)
WBC: 5.3 10*3/uL (ref 4.0–10.5)
nRBC: 0 % (ref 0.0–0.2)

## 2023-06-10 LAB — MAGNESIUM: Magnesium: 1.9 mg/dL (ref 1.7–2.4)

## 2023-06-10 MED ORDER — SODIUM CHLORIDE 0.9% FLUSH
10.0000 mL | Freq: Once | INTRAVENOUS | Status: AC
  Administered 2023-06-10: 10 mL via INTRAVENOUS

## 2023-06-10 MED ORDER — HEPARIN SOD (PORK) LOCK FLUSH 100 UNIT/ML IV SOLN
500.0000 [IU] | Freq: Once | INTRAVENOUS | Status: AC
  Administered 2023-06-10: 500 [IU] via INTRAVENOUS

## 2023-06-10 NOTE — Patient Instructions (Addendum)
 [facility] at [facility] Discharge Instructions   You were seen and examined today by Dr. Cheree Cords.  He reviewed the results of your lab work which are normal/stable.   Dr. Linnell Richardson wants you to start back on the Zejula . Take as prescribed.   We will see you back in 2 weeks.   Return as scheduled.    Thank you for choosing [facility] at [facility] to provide your oncology and hematology care.  To afford each patient quality time with our provider, please arrive at least 15 minutes before your scheduled appointment time.   If you have a lab appointment with the Cancer Center please come in thru the Main Entrance and check in at the main information desk.  You need to re-schedule your appointment should you arrive 10 or more minutes late.  We strive to give you quality time with our providers, and arriving late affects you and other patients whose appointments are after yours.  Also, if you no show three or more times for appointments you may be dismissed from the clinic at the providers discretion.     Again, thank you for choosing [facility].  Our hope is that these requests will decrease the amount of time that you wait before being seen by our physicians.       _____________________________________________________________  Should you have questions after your visit to [facility], please contact our office at [phone] and follow the prompts.  Our office hours are 8:00 a.m. and 4:30 p.m. Monday - Friday.  Please note that voicemails left after 4:00 p.m. may not be returned until the following business day.  We are closed weekends and major holidays.  You do have access to a nurse 24-7, just call the main number to the clinic [phone] and do not press any options, hold on the line and a nurse will answer the phone.    For prescription refill requests, have your pharmacy contact our office and allow 72 hours.     Due to Covid, you will need to wear a mask upon entering the hospital. If you do not have a mask, a mask will be given to you at the Main Entrance upon arrival. For doctor visits, patients may have 1 support person age 31 or older with them. For treatment visits, patients can not have anyone with them due to social distancing guidelines and our immunocompromised population.

## 2023-06-10 NOTE — Progress Notes (Signed)
 [facility] [address] [place] [address]    Clinic Day:  06/10/2023  Referring physician: Tower, Rachel Seeds, MD  Patient Care Team: Carlson, Rachel Seeds, MD as PCP - General Rachel Grates, MD as PCP - Cardiology (Cardiology) Rachel Boros, MD as Medical Oncologist (Medical Oncology) Rachel Knuckles, RN as Oncology Nurse Navigator (Medical Oncology)   ASSESSMENT & PLAN:   Assessment: 1.  Peritoneal carcinomatosis/ovarian cancer: - Presentation: 30-month history of abdominal distention and bloating. - Last colonoscopy (01/01/2020): No evidence of malignancy. - CTAP (08/23/2022): Moderate ascites, probable omental caking in the left lower quadrant, possible small peritoneal implants seen in the pelvis.  Diverticulosis of descending and sigmoid colon with moderate wall thickening of the distal sigmoid colon.  Small bilateral pleural effusions. - 08/26/2022: CA125: 7356, CEA: 1 - Germline mutation testing (09/16/2017): Negative. - 4 cycles of carboplatin  and paclitaxel  from 09/12/2022 through 11/14/2022 - 12/12/2022: Tumor debulking surgery including total omentectomy, resection of peritoneal disease, small bowel resection with reanastomosis, en bloc resection of the left fallopian tube and ovary as well as rectosigmoid colon, left ureterolysis, right salpingo-oophorectomy, end descending colostomy by Dr. Orvil Carlson - Pathology: Omental resection-high-grade serous carcinoma, cecal implant-high-grade serous carcinoma, ileal resection-mesenteric and serosal involvement by high-grade serous carcinoma, left lower abdominal wall nodule-high-grade serous carcinoma, right tube and ovary resection-high-grade serous carcinoma involving serosa, bladder peritoneum and rectosigmoid colon-high-grade serous carcinoma involving colonic wall and extensive involvement of the serosa and mesenteric adipose tissue.  Metastatic carcinoma involving 8/13 nodes.  pT3 cpN1a. - NGS: MSI-stable,  TMB-low, HRD-negative, T p53 pathogenic variant, PD-L1 (22 C3) CPS 5, ER 2+ positive, FOLR1 2+, 75% (mirvetuximab) - 4 cycles of carboplatin  and paclitaxel  from 01/17/2023 through 03/25/2023 - Germline mutation testing: Negative - [name] started on 04/22/2023   2. Social/Family History: -She lives at home with husband and is his primary caregiver. She is independent ADLS and iADLS. Retired International aid/development worker. No tobacco use.  -She had skin squamous cell carcinoma. Her brother had colon cancer at 25 and follicular non-Hodgkin's lymphoma and DLBCL, her maternal grandfather died of prostate cancer, maternal first cousin had colon cancer, and maternal aunt died of lung cancer (smoker).     Plan: 1.  Peritoneal carcinomatosis/ovarian cancer: - CT CAP (03/18/2023): No significant change in diffuse residual peritoneal thickening without discrete visible nodularity.  Moderate loculated left pleural effusion.  Trace right pleural effusion.  No evidence of lymphadenopathy or metastatic disease to the liver. - She was started on niraparib  200 mg at bedtime on 04/22/2023.  Held during hospitalization. - She was hospitalized from 05/16/2023 through 06/01/2023 with partial small bowel obstruction.  Presentation at admission: Incessant vomiting - CT [facility] on 05/16/2023: Early/intermittent small bowel obstruction with transition point at the level of prior ileocolic anastomosis.  Bilateral pleural effusions, left greater than right with stable left lower lobe atelectasis.  Stable mild diffuse peritoneal thickening without discrete nodularity or masses.  Trace abdominal/pelvic ascites. - She was discharged home on dexamethasone  2 mg daily, which she completed on 06/08/2023.  She denied any vomiting since discharge from the hospital. - Labs today: Normal LFTs.  CBC grossly normal. - She is eating full soft diet with grits, mashed potatoes, strained meat and fish. - She will talk to our dietitian today and advance  diet slowly but still avoid regular diet.  She is taking Senokot and MiraLAX  twice daily which she will continue. - She has appointment with palliative care on Thursday. -  She may restart niraparib  200 mg daily which was held during hospitalization. - RTC 2 weeks for follow-up.   2.  Left pleural effusion: - Last left thoracentesis on 03/29/2023, 1.2 L removed.  Prior thoracentesis on 03/06/2023, 2 L removed. - On auscultation: Decreased breath sounds in the left lung base.  She denies any worsening of baseline breathing status.  Will closely monitor.   3.  Left gastrocnemius/calf vein distal DVT (Dx 07/24/2022): - Continue Eliquis  twice daily.  No issues with bleeding.   4.  Peripheral neuropathy: - Tingling in the fingertips is constant and stable.   5.  Insomnia: - Continue temazepam  30 mg at bedtime which is helping.    Orders Placed This Encounter  Procedures   CBC with Differential    Standing Status:   Future    Expected Date:   06/24/2023    Expiration Date:   06/09/2024   Comprehensive metabolic panel    Standing Status:   Future    Expected Date:   06/24/2023    Expiration Date:   06/09/2024   Magnesium     Standing Status:   Future    Expected Date:   06/24/2023    Expiration Date:   06/09/2024      Rachel Carlson,acting as a scribe for Rachel Boros, MD.,have documented all relevant documentation on the behalf of Rachel Boros, MD,as directed by  Rachel Boros, MD while in the presence of Rachel Boros, MD.  I, Rachel Boros MD, have reviewed the above documentation for accuracy and completeness, and I agree with the above.      Rachel Boros, MD   5/12/202512:39 PM  CHIEF COMPLAINT:   Diagnosis: Peritoneal carcinomatosis/ovarian cancer    Cancer Staging  No matching staging information was found for the patient.    Prior Therapy: 1. Neoadjuvant carboplatin  and paclitaxel , 4 cycles, 09/12/22 - 11/14/22 2. Tumor debulking  surgery on 12/12/22  Current Therapy:  adjuvant carboplatin  and paclitaxel     HISTORY OF PRESENT ILLNESS:   Oncology History Overview Note  CARIS: FOLR1 2+ (75%) ER(2+) HRD-, PDL1 CPS 5%, p53 mutation No mutation in BRCA1/2, ATM, ERBB2, KRAS PR negative   Carcinomatosis (HCC)  08/31/2022 Initial Diagnosis   Carcinomatosis (HCC)   09/12/2022 - 03/25/2023 Chemotherapy   Patient is on Treatment Plan : OVARIAN Carboplatin  (AUC 6) + Paclitaxel  (175) q21d X 6 Cycles     Gynecologic malignancy (HCC)  12/12/2022 Initial Diagnosis   Gynecologic malignancy (HCC)   02/19/2023 Genetic Testing   Negative genetic testing on the Multi-cancer + RNA panel.  The report date is February 19, 2023.  The Multi-Cancer + RNA Panel offered by [facility] includes sequencing and/or deletion/duplication analysis of the following 70 genes:  AIP*, ALK, APC*, ATM*, AXIN2*, BAP1*, BARD1*, BLM*, BMPR1A*, BRCA1*, BRCA2*, BRIP1*, CDC73*, CDH1*, CDK4, CDKN1B*, CDKN2A, CHEK2*, CTNNA1*, DICER1*, EPCAM (del/dup only), EGFR, FH*, FLCN*, GREM1 (promoter dup only), HOXB13, KIT, LZTR1, MAX*, MBD4, MEN1*, MET, MITF, MLH1*, MSH2*, MSH3*, MSH6*, MUTYH*, NF1*, NF2*, NTHL1*, PALB2*, PDGFRA, PMS2*, POLD1*, POLE*, POT1*, PRKAR1A*, PTCH1*, PTEN*, RAD51C*, RAD51D*, RB1*, RET, SDHA* (sequencing only), SDHAF2*, SDHB*, SDHC*, SDHD*, SMAD4*, SMARCA4*, SMARCB1*, SMARCE1*, STK11*, SUFU*, TMEM127*, TP53*, TSC1*, TSC2*, VHL*. RNA analysis is performed for * genes.   Negative genetic testing on the common hereditary cancer panel.  The Hereditary Gene Panel offered by [facility] includes sequencing and/or deletion duplication testing of the following 47 genes: APC, ATM, AXIN2, BARD1, BMPR1A, BRCA1, BRCA2, BRIP1, CDH1, CDK4, CDKN2A (p14ARF), CDKN2A (p16INK4a), CHEK2, CTNNA1, DICER1, EPCAM (  Deletion/duplication testing only), GREM1 (promoter region deletion/duplication testing only), KIT, MEN1, MLH1, MSH2, MSH3, MSH6, MUTYH, NBN, NF1, NHTL1, PALB2, PDGFRA,  PMS2, POLD1, POLE, PTEN, RAD50, RAD51C, RAD51D, SDHB, SDHC, SDHD, SMAD4, SMARCA4. STK11, TP53, TSC1, TSC2, and VHL.  The following genes were evaluated for sequence changes only: SDHA and HOXB13 c.251G>A variant only. The report date is September 25, 2017.      INTERVAL HISTORY:   Rachel Carlson is a 78 y.o. female presenting to clinic today for follow up of Peritoneal carcinomatosis/ovarian cancer. She was last seen by me on 05/13/23.  Since her last visit, she was admitted to the hospital from 05/16/23 to 06/01/23 for a partial bowel obstruction.  Today, she states that she is doing well overall. Her appetite level is at 50%. Her energy level is at 75%.  PAST MEDICAL HISTORY:   Past 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)    Esophageal stricture    Family history of breast cancer    Family history of colon cancer    Family history of melanoma    Family history of prostate cancer    Fibroids 1998   uterine, history of (left ovaries)   GERD (gastroesophageal reflux disease)    History of hiatal hernia    History of uterine prolapse    Hx of adenomatous polyp of colon 10/19/2014   Hyperlipidemia    Hypertension    borderline no meds diet and exercise   Nausea and vomiting 05/19/2023   Neuromuscular disorder (HCC)    neuropathy from chemo   Obesity    Ovarian cancer (HCC)    Peritoneal carcinomatosis (HCC)    PVC (premature ventricular contraction)    first dx by holter in 1980's, echo (6/10) EF 55-60%, normal diastolic fxn, normal size RV and fxn, mild MR, PASP   Squamous cell carcinoma of arm    ovarian   Tubal pregnancy    Ulcerative proctosigmoiditis (HCC)    Urge urinary incontinence 06/03/2007   Qualifier: Diagnosis of  By: Lennice Quivers CMA, Kelly Patient      Surgical History: Past Surgical History:  Procedure  Laterality Date   BILATERAL SALPINGECTOMY Bilateral 12/12/2022   Procedure: OPEN BILATERAL SALPINGECTOMY-OOPHORECTOMY;  Surgeon: Suzi Essex, MD;  Location: [facility];  Service: Gynecology;  Laterality: Bilateral;   BLADDER NECK SUSPENSION  1998   BREAST BIOPSY Left    BREAST LUMPECTOMY Left    benign   CATARACT EXTRACTION Right 01/2020   CATARACT EXTRACTION W/PHACO Left 08/12/2020   Procedure: CATARACT EXTRACTION PHACO AND INTRAOCULAR LENS PLACEMENT LEFT EYE;  Surgeon: Tarri Farm, MD;  Location: [facility];  Service: Ophthalmology;  Laterality: Left;  left CDE=10.71   COLONOSCOPY  05/18/2011/10/12/14   Dr. Loy Ruff   DEBULKING N/A 12/12/2022   Procedure: RADICAL TUMOR DEBULKING, PERITONEAL STRIPPING, MOBILIZATION SPLENIC FLEXURE, RECTOSIGMOID RESECTION,SMALL BOWEL RESECTION AND REANASTOMOSIS ,END TO END DESCENDING COLOSTOMY;  Surgeon: Suzi Essex, MD;  Location: [facility];  Service: Gynecology;  Laterality: N/A;   ECTOPIC PREGNANCY SURGERY  1980   ESOPHAGOGASTRODUODENOSCOPY (EGD) WITH ESOPHAGEAL DILATION  04/03/2012   Dr. Loy Ruff   INCISION AND DRAINAGE ABSCESS N/A 01/02/2023   Procedure: INCISION AND DRAINAGE OF ABDOMINAL WOUND SEROMA;  Surgeon: Abdul Hodgkin, MD;  Location: [facility];  Service: Gynecology;  Laterality: N/A;  IR IMAGING GUIDED PORT INSERTION  09/06/2022   OMENTECTOMY N/A 12/12/2022   Procedure: OMENTECTOMY;  Surgeon: Suzi Essex, MD;  Location: [facility];  Service: Gynecology;  Laterality: N/A;   PARTIAL HYSTERECTOMY     abdominal, fibroids, and prolapse (1998) bladder tack   SALPINGOOPHORECTOMY Bilateral 12/12/2022   Procedure: DIAGNOSTIC LAPAROSCOPY;  Surgeon: Suzi Essex, MD;  Location: [facility];  Service: Gynecology;  Laterality: Bilateral;   SKIN CANCER EXCISION     UPPER GASTROINTESTINAL ENDOSCOPY      Social History: Social History   Socioeconomic History   Marital status: Married    Spouse name: Not on file   Number of  children: 3   Years of education: Not on file   Highest education level: Not on file  Occupational History   Occupation: International aid/development worker    Comment: retired 2009    Employer: unemployed  Tobacco Use   Smoking status: Never   Smokeless tobacco: Never  Vaping Use   Vaping status: Never Used  Substance and Sexual Activity   Alcohol  use: No    Alcohol /week: 0.0 standard drinks of alcohol     Comment: rarely beer   Drug use: No   Sexual activity: Not Currently  Other Topics Concern   Not on file  Social History Narrative   Retired International aid/development worker, 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)   Grown children   Never smoker no alcohol  tobacco or drug use   Social Drivers of Corporate investment banker Strain: Low Risk  (08/28/2022)   Overall Financial Resource Strain (CARDIA)    Difficulty of Paying Living Expenses: Not hard at all  Food Insecurity: No Food Insecurity (05/16/2023)   Hunger Vital Sign    Worried About Running Out of Food in the Last Year: Never true    Ran Out of Food in the Last Year: Never true  Transportation Needs: No Transportation Needs (05/16/2023)   PRAPARE - Administrator, Civil Service (Medical): No    Lack of Transportation (Non-Medical): No  Physical Activity: Inactive (08/28/2022)   Exercise Vital Sign    Days of Exercise per Week: 0 days    Minutes of Exercise per Session: 0 min  Stress: No Stress Concern Present (08/28/2022)   [employer] of Occupational Health - Occupational Stress Questionnaire    Feeling of Stress : Not at all  Social Connections: Moderately Integrated (05/16/2023)   Social Connection and Isolation Panel [NHANES]    Frequency of Communication with Friends and Family: More than three times a week    Frequency of Social Gatherings with Friends and Family: More than three times a week    Attends Religious Services: Never    Database administrator or Organizations: Yes    Attends Engineer, structural: More than 4  times per year    Marital Status: Married  Catering manager Violence: Not At Risk (05/16/2023)   Humiliation, Afraid, Rape, and Kick questionnaire    Fear of Current or Ex-Partner: No    Emotionally Abused: No    Physically Abused: No    Sexually Abused: No    Family History: Family History  Problem Relation Age of Onset   Heart attack Mother 35   Transient ischemic attack Mother    Diabetes type II Mother    Sudden death Mother        died age 25   Diabetes Mother    Coronary artery disease Mother    Skin  cancer Mother    Barrett's esophagus Mother    Sudden death Father 69       "coronary arteriosclerosis" on death certificate   Breast cancer Sister        dx in her 17s   Colon polyps Sister        adenomatous   Arrhythmia Brother    Colon cancer Brother 28   Congestive Heart Failure Maternal Grandmother    Prostate cancer Maternal Grandfather        dx in 74s   Tuberculosis Paternal Grandfather    Ehlers-Danlos syndrome Daughter    Colon cancer Son 45       lynch syndrome   Other Son        parotid gland tumor   Skin cancer Maternal Aunt    Barrett's esophagus Maternal Aunt    Melanoma Maternal Uncle    Colon cancer Cousin        mid 51s; maternal cousin   Colon cancer Other        MGMs brother   Esophageal cancer Neg Hx    Stomach cancer Neg Hx    Rectal cancer Neg Hx    Uterine cancer Neg Hx    Bladder Cancer Neg Hx     Current Medications:  Current Outpatient Medications:    apixaban  (ELIQUIS ) 5 MG TABS tablet, Take 1 tablet by mouth twice daily, Disp: 180 tablet, Rfl: 0   Ascorbic Acid  (VITAMIN C ) 1000 MG tablet, Take 1,000 mg by mouth daily., Disp: , Rfl:    calcium  citrate-vitamin D  (CITRACAL+D) 315-200 MG-UNIT per tablet, Take 1 tablet by mouth 2 (two) times daily., Disp: , Rfl:    Cholecalciferol (VITAMIN D -3) 5000 UNITS TABS, Take 5,000 Units by mouth daily., Disp: , Rfl:    Coenzyme Q10 (CO Q-10) 200 MG CAPS, Take 200 mg by mouth daily., Disp: ,  Rfl:    CRANBERRY PO, Take 650 mg by mouth daily., Disp: , Rfl:    Glucosamine-Chondroitin (GLUCOSAMINE CHONDR COMPLEX PO), Take 2 capsules by mouth daily., Disp: , Rfl:    GRAPE SEED EXTRACT PO, Take 1 capsule by mouth daily., Disp: , Rfl:    ketoconazole  (NIZORAL ) 2 % cream, Apply 1 Application topically daily. To yeast rash (Patient taking differently: Apply 1 Application topically as needed for irritation (yeast rash). To yeast rash), Disp: 30 g, Rfl: 1   lidocaine -prilocaine  (EMLA ) cream, Apply 1 Application topically as needed., Disp: , Rfl:    meclizine  (ANTIVERT ) 25 MG tablet, TAKE 1 TABLET BY MOUTH THREE TIMES DAILY AS NEEDED FOR NAUSEA OR DIZZINESS (Patient taking differently: Take 25 mg by mouth 3 (three) times daily as needed for nausea or dizziness.), Disp: 20 tablet, Rfl: 0   milk thistle 175 MG tablet, Take 175 mg by mouth daily., Disp: , Rfl:    Omega-3 Fatty Acids (FISH OIL TRIPLE STRENGTH PO), Take 1 capsule by mouth daily., Disp: , Rfl:    ondansetron  (ZOFRAN ) 8 MG tablet, Take 8 mg by mouth every 8 (eight) hours as needed for nausea or vomiting., Disp: , Rfl:    Polyethyl Glycol-Propyl Glycol (SYSTANE OP), Place 1 drop into both eyes 4 (four) times daily as needed (for dryness)., Disp: , Rfl:    PREVACID  24HR 15 MG capsule, Take 15 mg by mouth See admin instructions. Take 15 mg by mouth in the morning before any food and an additional 15 mg once a day as needed for unresolved reflux, Disp: , Rfl:  prochlorperazine  (COMPAZINE ) 10 MG tablet, Take 10 mg by mouth every 6 (six) hours as needed for nausea or vomiting., Disp: , Rfl:    rosuvastatin  (CRESTOR ) 20 MG tablet, Take 1 tablet (20 mg total) by mouth at bedtime., Disp: 90 tablet, Rfl: 3   temazepam  (RESTORIL ) 30 MG capsule, Take 1 capsule (30 mg total) by mouth at bedtime as needed for sleep., Disp: 30 capsule, Rfl: 0   vitamin B-12 (CYANOCOBALAMIN ) 1000 MCG tablet, Take 1,000 mcg by mouth daily., Disp: , Rfl:    zinc gluconate  50 MG tablet, Take 50 mg by mouth daily., Disp: , Rfl:    Allergies: Allergies  Allergen Reactions   Aspartame     Intolerance to artifical sweeteners, pt states caused burning of bladder   Levaquin  [Levofloxacin ] Nausea Only   Pantoprazole  Sodium Diarrhea    Abdominal pain   Ranitidine Diarrhea    abdominal pain   Omeprazole  Diarrhea    Abdominal pain  Other Reaction(s): Not available   Sulfasalazine  Rash and Other (See Comments)    Fever, chills, headache, muscle pain also    REVIEW OF SYSTEMS:   Review of Systems  Constitutional:  Negative for chills, fatigue and fever.  HENT:   Negative for lump/mass, mouth sores, nosebleeds, sore throat and trouble swallowing.   Eyes:  Negative for eye problems.  Respiratory:  Negative for cough and shortness of breath.   Cardiovascular:  Negative for chest pain, leg swelling and palpitations.  Gastrointestinal:  Negative for abdominal pain, constipation, diarrhea, nausea and vomiting.  Genitourinary:  Negative for bladder incontinence, difficulty urinating, dysuria, frequency, hematuria and nocturia.   Musculoskeletal:  Negative for arthralgias, back pain, flank pain, myalgias and neck pain.  Skin:  Negative for itching and rash.  Neurological:  Positive for dizziness and numbness. Negative for headaches.  Hematological:  Does not bruise/bleed easily.  Psychiatric/Behavioral:  Negative for depression, sleep disturbance and suicidal ideas. The patient is not nervous/anxious.   All other systems reviewed and are negative.    VITALS:   Blood pressure 122/73, pulse 73, temperature (!) 97.2 F (36.2 C), temperature source Tympanic, weight 157 lb 6.5 oz (71.4 kg), last menstrual period 05/29/1996, SpO2 100%.  Wt Readings from Last 3 Encounters:  06/10/23 157 lb 6.5 oz (71.4 kg)  06/07/23 156 lb (70.8 kg)  05/31/23 160 lb 4.4 oz (72.7 kg)    Body mass index is 28.79 kg/m.  Performance status (ECOG): 1 - Symptomatic but completely  ambulatory  PHYSICAL EXAM:   Physical Exam Vitals and nursing note reviewed. Exam conducted with a chaperone present.  Constitutional:      Appearance: Normal appearance.  Cardiovascular:     Rate and Rhythm: Normal rate and regular rhythm.     Pulses: Normal pulses.     Heart sounds: Normal heart sounds.  Pulmonary:     Effort: Pulmonary effort is normal.     Breath sounds: Normal breath sounds.  Abdominal:     Palpations: Abdomen is soft. There is no hepatomegaly, splenomegaly or mass.     Tenderness: There is no abdominal tenderness.  Musculoskeletal:     Right lower leg: No edema.     Left lower leg: No edema.  Lymphadenopathy:     Cervical: No cervical adenopathy.     Right cervical: No superficial, deep or posterior cervical adenopathy.    Left cervical: No superficial, deep or posterior cervical adenopathy.     Upper Body:     Right upper body:  No supraclavicular or axillary adenopathy.     Left upper body: No supraclavicular or axillary adenopathy.  Neurological:     General: No focal deficit present.     Mental Status: She is alert and oriented to person, place, and time.  Psychiatric:        Mood and Affect: Mood normal.        Behavior: Behavior normal.     LABS:   CBC     Component Value Date/Time   WBC 5.3 06/10/2023 0929   RBC 3.52 (L) 06/10/2023 0929   HGB 11.3 (L) 06/10/2023 0929   HGB 11.0 (L) 04/26/2023 1043   HCT 33.7 (L) 06/10/2023 0929   PLT 187 06/10/2023 0929   PLT 199 04/26/2023 1043   MCV 95.7 06/10/2023 0929   MCH 32.1 06/10/2023 0929   MCHC 33.5 06/10/2023 0929   RDW 17.2 (H) 06/10/2023 0929   LYMPHSABS 1.7 06/10/2023 0929   MONOABS 0.4 06/10/2023 0929   EOSABS 0.1 06/10/2023 0929   BASOSABS 0.0 06/10/2023 0929    CMP      Component Value Date/Time   NA 134 (L) 06/10/2023 0929   K 3.7 06/10/2023 0929   CL 102 06/10/2023 0929   CO2 23 06/10/2023 0929   GLUCOSE 88 06/10/2023 0929   BUN 18 06/10/2023 0929   CREATININE 0.48  06/10/2023 0929   CREATININE 0.64 04/26/2023 1043   CALCIUM  9.2 06/10/2023 0929   PROT 6.2 (L) 06/10/2023 0929   ALBUMIN  3.3 (L) 06/10/2023 0929   AST 18 06/10/2023 0929   AST 11 (L) 04/26/2023 1043   ALT 19 06/10/2023 0929   ALT 7 04/26/2023 1043   ALKPHOS 52 06/10/2023 0929   BILITOT 1.1 06/10/2023 0929   BILITOT 0.7 04/26/2023 1043   GFRNONAA >60 06/10/2023 0929   GFRNONAA >60 04/26/2023 1043   GFRAA 94 06/04/2007 1522     Lab Results  Component Value Date   CEA1 1.0 08/26/2022   /  CEA  Date Value Ref Range Status  08/26/2022 1.0 0.0 - 4.7 ng/mL Final    Comment:    (NOTE)                             Nonsmokers          <3.9                             Smokers             <5.6 [employer] Electrochemiluminescence Immunoassay (ECLIA) Values obtained with different assay methods or kits cannot be used interchangeably.  Results cannot be interpreted as absolute evidence of the presence or absence of malignant disease. Performed At: [facility] [address] [place], [place] [id number] Pearlean Botts MD MW:[id number]    No results found for: "PSA1" No results found for: "CAN199" Lab Results  Component Value Date   CAN125 439.0 (H) 05/13/2023    No results found for: "TOTALPROTELP", "ALBUMINELP", "A1GS", "A2GS", "BETS", "BETA2SER", "GAMS", "MSPIKE", "SPEI" Lab Results  Component Value Date   TIBC 369 10/24/2022   FERRITIN 22 10/24/2022   IRONPCTSAT 10 (L) 10/24/2022   No results found for: "LDH"   STUDIES:   DG CHEST PORT 1 VIEW Result Date: 05/30/2023 CLINICAL DATA:  Pleural effusion. EXAM: PORTABLE CHEST 1 VIEW COMPARISON:  03/29/2023 FINDINGS: Increased blunting at the left costophrenic angle is suggestive for a  small left pleural effusion. Slightly prominent lung markings appear chronic. No significant right pleural effusion. Retrocardiac opacity is compatible with a hiatal hernia. Right jugular Port-A-Cath tip is near the superior  cavoatrial junction. This is a CT injectable port. Trachea is midline. IMPRESSION: 1. Blunting at the left costophrenic angle is suggestive for a small left pleural effusion. 2. Hiatal hernia. Electronically Signed   By: [name] M.D.   On: 05/30/2023 09:22   DG Abd 1 View Result Date: 05/26/2023 CLINICAL DATA:  Recent vomiting EXAM: ABDOMEN - 1 VIEW COMPARISON:  05/23/2023 FINDINGS: Scattered large and small bowel gas is noted. Decreased small bowel dilatation is noted when compare with the prior exam. A single mildly dilated loop of small bowel is seen. No free air is noted. No bony abnormality is noted. IMPRESSION: Improvement in the degree of small-bowel dilatation. Electronically Signed   By: Violeta Grey M.D.   On: 05/26/2023 10:48   DG Abd Portable 1V Result Date: 05/23/2023 CLINICAL DATA:  Follow up small bowel obstruction EXAM: PORTABLE ABDOMEN - 1 VIEW COMPARISON:  05/22/2023 FINDINGS: Scattered large and small bowel gas is noted. Persistent small bowel dilatation is noted stable from the previous day. No free air is seen. No bony abnormality is noted. IMPRESSION: Persistent small bowel dilatation stable from the previous day. Electronically Signed   By: Violeta Grey M.D.   On: 05/23/2023 09:48   DG Abd Portable 1V Result Date: 05/22/2023 CLINICAL DATA:  Follow up small bowel obstruction EXAM: PORTABLE ABDOMEN - 1 VIEW COMPARISON:  05/18/2023 FINDINGS: Previously seen colonic contrast has cleared. Persistent small bowel dilatation is noted consistent with the given clinical history of partial small bowel obstruction. No free air is noted. No bony abnormality is seen. IMPRESSION: Persistent small bowel dilatation. Electronically Signed   By: Violeta Grey M.D.   On: 05/22/2023 10:54   DG Abd Portable 1V-Small Bowel Obstruction Protocol-initial, 8 hr delay Result Date: 05/18/2023 CLINICAL DATA:  Small-bowel follow-through. EXAM: PORTABLE ABDOMEN - 1 VIEW COMPARISON:  May 17, 2023 (5:19 a.m.)  FINDINGS: A kinked enteric tube is seen with its distal tip overlying the expected region of the gastroesophageal junction. This is unchanged in appearance when compared to the prior study. Persistent dilated, air distended loops of small bowel are seen within the mid abdomen. Radiopaque contrast is also seen within the ascending and descending colon. No radio-opaque calculi are seen. IMPRESSION: Findings consistent with a partial small bowel obstruction. Electronically Signed   By: Virgle Grime M.D.   On: 05/18/2023 22:10   DG Abd Portable 1V-Small Bowel Obstruction Protocol-initial, 8 hr delay Result Date: 05/17/2023 CLINICAL DATA:  Small-bowel follow-through. EXAM: PORTABLE ABDOMEN - 1 VIEW COMPARISON:  05/16/2023 FINDINGS: NG tube is folded back on itself in the patient's known hiatal hernia. Distal tip of the NG tube is not been included on the film. Gaseous small bowel distention in the abdomen measures up to 4 cm diameter. There is evidence of gas and stool in the left colon without substantial colonic dilatation. No discernible enteric contrast within the small bowel or colon. IMPRESSION: 1. Gaseous small bowel distention in the abdomen measures up to 4 cm diameter. Imaging features are compatible with small bowel obstruction. 2. No discernible enteric contrast within the small bowel or colon. 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 Electronically Signed   By: Lyell Samuel  Garon Kaiser M.D.   On: 05/17/2023 07:53   DG Abd Portable 1V Result Date: 05/16/2023 CLINICAL DATA:  Check gastric catheter placement EXAM: PORTABLE ABDOMEN - 1 VIEW COMPARISON:  None Available. FINDINGS: Scattered large and small bowel gas is noted. Gastric catheter is coiled within a hiatal hernia just above the esophageal hiatus. Chest wall port is noted. Lung bases demonstrate minimal atelectasis on the left.  IMPRESSION: Gastric catheter coiled within a hiatal hernia. Electronically Signed   By: Violeta Grey M.D.   On: 05/16/2023 19:15   CT ABDOMEN PELVIS W CONTRAST Result Date: 05/16/2023 CLINICAL DATA:  Abdominal pain, vomiting, history of ovarian cancer and peritoneal carcinomatosis EXAM: CT ABDOMEN AND PELVIS WITH CONTRAST TECHNIQUE: Multidetector CT imaging of the abdomen and pelvis was performed using the standard protocol following bolus administration of intravenous contrast. RADIATION DOSE REDUCTION: This exam was performed according to the departmental dose-optimization program which includes automated exposure control, adjustment of the mA and/or kV according to patient size and/or use of iterative reconstruction technique. CONTRAST:  OMNIPAQUE  IOHEXOL  300 MG/ML  SOLN COMPARISON:  03/18/2023 FINDINGS: Lower chest: Large hiatal hernia. Bilateral pleural effusions, left greater than right. Stable left lower lobe atelectasis. Hepatobiliary: No focal liver abnormality is seen. No gallstones, gallbladder wall thickening, or biliary dilatation. Pancreas: Unremarkable. No pancreatic ductal dilatation or surrounding inflammatory changes. Spleen: Normal in size without focal abnormality. Adrenals/Urinary Tract: The adrenals are unremarkable. The kidneys enhance normally and symmetrically. No urinary tract calculi or obstructive uropathy. The bladder is decompressed, limiting its evaluation. Stomach/Bowel: Postsurgical changes from distal colon resection and diverting colostomy left upper quadrant. Evidence of prior small bowel resection with ileo colic anastomosis. There is moderate distension of the distal small bowel with scattered gas fluid levels, with jejunum measuring up to 3.1 cm in diameter. There is formed stool within the distal small bowel just proximal to the ileocolic anastomosis. Findings are concerning for early or intermittent small bowel obstruction with transition at the site of ileocolic  anastomosis. No bowel wall thickening or acute inflammatory changes. Large hiatal hernia as described above. Vascular/Lymphatic: Aortic atherosclerosis. No pathologic adenopathy within the abdomen or pelvis. Reproductive: Status post hysterectomy. No adnexal masses. Other: Trace free fluid within the right upper quadrant and lower pelvis. No free intraperitoneal gas. Stable postsurgical changes from midline laparotomy. Mild diffuse peritoneal thickening again identified, grossly stable. No distinct mesenteric nodules or masses. Musculoskeletal: No acute or destructive bony abnormalities. Reconstructed images demonstrate no additional findings. IMPRESSION: 1. Findings concerning for early or intermittent small bowel obstruction with transition at the level of prior ileocolic anastomosis. Continued radiographic follow-up recommended. 2. Bilateral pleural effusions, left greater than right, with stable left lower lobe atelectasis. 3. Stable mild diffuse peritoneal thickening without discrete nodularity or masses. 4. Trace abdominal and pelvic ascites. 5.  Aortic Atherosclerosis (ICD10-I70.0). 6. Stable large hiatal hernia. Electronically Signed   By: Bobbye Burrow M.D.   On: 05/16/2023 14:20

## 2023-06-10 NOTE — Progress Notes (Signed)
 Nutrition Follow-up:  Pt with peritoneal carcinomatosis. S/p neoadjuvant chemotherapy followed by debulking surgery 11/13 under the care of Dr. Orvil Bland. Patient is currently receiving carbo/taxol  q21d.    4/17-5/3 admission with partial SBO  Met with patient in clinic. She reports following soft diet s/p discharge. Pt unsure of foods to eat and how to prepare them. Currently pt is blending food until consistency of puree. Patient asking about fibers in food and expected diet advancement. She is avoiding all raw fruits and vegetables. Patient is drinking 2 Ensure. Denies nausea, vomiting, diarrhea, constipation.    Medications: reviewed   Labs: Na 134, albumin  3.3  Anthropometrics: Wt 157 lb 6.5 oz today   4/14 - 160 lb 7.9 oz  3/24 - 160 lb 4.4 oz    NUTRITION DIAGNOSIS: Unintended wt loss - continues     INTERVENTION:  Educated on foods high and low ib fiber, discussed enjoying whole foods without need to puree if desired - handout with list of foods and sample menu provided  Continue 2 Ensure Plus/equivalent - samples + coupons provided Will continue working with pt for diet advancement     MONITORING, EVALUATION, GOAL: wt trends, intake    NEXT VISIT: Monday June 2 via telephone

## 2023-06-12 ENCOUNTER — Telehealth: Payer: Self-pay

## 2023-06-12 NOTE — Telephone Encounter (Signed)
 Rachel Carlson is called to provide pre-procedural instructions for her [] Cystoscopy, [] Bladder Botox  or [] Urethral Bulking. She is scheduled on 06/13/2023.  The patient is not having worsening urinary symptoms to her urinary patterns. The patient is not experiencing UTI symptoms.  The patient held her ELIQUIS  as instructed, last dose 06-07-2023 [] The patient is scheduled to come in 2-3 days prior for a non-provider visit to run a POC Urinalysis.  [] The patient cannot come in for a pre-procedural Urinalysis therefore discussed with provider to determine treatment prior to procedure.   [] The patient has been prescribed the pre-procedural antibiotics. The patient is reminded to take the prescribed antibiotics the morning of the procedure, an hour before and the morning of the following day.   [x] The patient has not previously been prescribed the pre-procedural anitbiotics, Patient is to take antibiotic treatment at arrival of procedure.   The patient is reminded to arrive 5 minutes prior to the scheduled appointment time and that a urine sample will need to be collected the upon arrival for the procedure.

## 2023-06-13 ENCOUNTER — Ambulatory Visit: Admitting: Obstetrics and Gynecology

## 2023-06-13 ENCOUNTER — Encounter: Payer: Self-pay | Admitting: Obstetrics and Gynecology

## 2023-06-13 VITALS — BP 142/79 | HR 80

## 2023-06-13 DIAGNOSIS — R350 Frequency of micturition: Secondary | ICD-10-CM

## 2023-06-13 DIAGNOSIS — C800 Disseminated malignant neoplasm, unspecified: Secondary | ICD-10-CM | POA: Diagnosis not present

## 2023-06-13 DIAGNOSIS — N3281 Overactive bladder: Secondary | ICD-10-CM

## 2023-06-13 DIAGNOSIS — C481 Malignant neoplasm of specified parts of peritoneum: Secondary | ICD-10-CM | POA: Diagnosis not present

## 2023-06-13 DIAGNOSIS — Z515 Encounter for palliative care: Secondary | ICD-10-CM | POA: Diagnosis not present

## 2023-06-13 LAB — POCT URINALYSIS DIPSTICK
Bilirubin, UA: NEGATIVE
Blood, UA: NEGATIVE
Glucose, UA: NEGATIVE
Ketones, UA: NEGATIVE
Leukocytes, UA: NEGATIVE
Nitrite, UA: NEGATIVE
Protein, UA: NEGATIVE
Spec Grav, UA: <=1.005 — AB (ref 1.010–1.025)
Urobilinogen, UA: 0.2 U/dL — NL
pH, UA: 6.5 (ref 5.0–8.0)

## 2023-06-13 MED ORDER — CEPHALEXIN 500 MG PO CAPS
500.0000 mg | ORAL_CAPSULE | Freq: Once | ORAL | Status: AC
Start: 2023-06-13 — End: 2023-06-13
  Administered 2023-06-13: 500 mg via ORAL

## 2023-06-13 MED ORDER — ONABOTULINUMTOXINA 100 UNITS IJ SOLR
150.0000 [IU] | Freq: Once | INTRAMUSCULAR | Status: AC
  Administered 2023-06-13: 150 [IU] via INTRAMUSCULAR

## 2023-06-13 MED ORDER — LIDOCAINE HCL URETHRAL/MUCOSAL 2 % EX GEL
1.0000 | Freq: Once | CUTANEOUS | Status: AC
  Administered 2023-06-13: 1 via URETHRAL

## 2023-06-13 MED ORDER — LIDOCAINE HCL 2 % IJ SOLN
50.0000 mL | Freq: Once | INTRAMUSCULAR | Status: AC
  Administered 2023-06-13: 1000 mg

## 2023-06-13 MED ORDER — SULFAMETHOXAZOLE-TRIMETHOPRIM 400-80 MG PO TABS: 1.0000 | ORAL_TABLET | Freq: Once | ORAL | Status: DC

## 2023-06-13 NOTE — Progress Notes (Signed)
 Intravesical Botox  Procedure:  78 y.o. yo F with OAB presenting today for intravesical botox .   Vitals:   06/13/23 1103  BP: (!) 142/79  Pulse: 80    Results for orders placed or performed in visit on 06/13/23 (from the past 24 hours)  POCT Urinalysis Dipstick     Status: Abnormal   Collection Time: 06/13/23 11:46 AM  Result Value Ref Range   Color, UA yellow    Clarity, UA clear    Glucose, UA Negative Negative   Bilirubin, UA negative    Ketones, UA negative    Spec Grav, UA <=1.005 (A) 1.010 - 1.025   Blood, UA negative    pH, UA 6.5 5.0 - 8.0   Protein, UA Negative Negative   Urobilinogen, UA 0.2 0.2 or 1.0 E.U./dL   Nitrite, UA negative    Leukocytes, UA Negative Negative   Appearance     Odor       Prior to the procedure, the patient took keflex  for antibiotic prophylaxis. Time out was performed. The bladder was catherized and 50 ml of 2% lidocaine  was placed in the bladder and 10 ml of 2% lidocaine  jelly placed in the urethra. . Cystoscopy was performed with sterile H2O and a 0 degree scope. Bladder mucosa was noted to be within normal limits. A total of 15 ml / 150 units of Botox  A,  Lot # [id number] Exp 06/2024  was injected in the detrusor muscle via 7 injections. These were spaced about 1 cm apart. Care was taken to avoid the ureteral orifices and the trigone. Patient tolerated the procedure well.  Impression: 78 y.o. 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 Patient tolerated procedure well.  Plan: Post-procedure instructions given regarding bleeding, infection, urinary retention.  Self-catheterization teaching was previously performed.   Patient will follow up in 4 weeks All questions answered.  Arma Lamp, MD

## 2023-06-13 NOTE — Patient Instructions (Signed)
 Taking Care of Yourself after Urodynamics, Cystoscopy, Bulkamid Injection, or Botox Injection   Drink plenty of water for a day or two following your procedure. Try to have about 8 ounces (one cup) at a time, and do this 6 times or more per day unless you have fluid restrictitons AVOID irritative beverages such as coffee, tea, soda, alcoholic or citrus drinks for a day or two, as this may cause burning with urination.  For the first 1-2 days after the procedure, your urine may be pink or red in color. You may have some blood in your urine as a normal side effect of the procedure. Large amounts of bleeding or difficulty urinating are NOT normal. Call the nurse line if this happens or go to the nearest Emergency Room if the bleeding is heavy or you cannot urinate at all and it is after hours.  You may experience some discomfort or a burning sensation with urination after having this procedure. You can use over the counter Azo or pyridium to help with burning and follow the instructions on the packaging. If it does not improve within 1-2 days, or other symptoms appear (fever, chills, or difficulty urinating) call the office to speak to a nurse.  You may return to normal daily activities such as work, school, driving, exercising and housework on the day of the procedure. If your doctor gave you a prescription, take it as ordered.

## 2023-06-20 ENCOUNTER — Encounter: Payer: Self-pay | Admitting: Family Medicine

## 2023-06-20 ENCOUNTER — Telehealth: Payer: Self-pay | Admitting: *Deleted

## 2023-06-20 ENCOUNTER — Ambulatory Visit (INDEPENDENT_AMBULATORY_CARE_PROVIDER_SITE_OTHER): Admitting: Family Medicine

## 2023-06-20 ENCOUNTER — Other Ambulatory Visit: Payer: Self-pay | Admitting: *Deleted

## 2023-06-20 VITALS — BP 112/70 | HR 91 | Temp 98.0°F | Ht 62.0 in | Wt 163.0 lb

## 2023-06-20 DIAGNOSIS — R21 Rash and other nonspecific skin eruption: Secondary | ICD-10-CM

## 2023-06-20 MED ORDER — CEPHALEXIN 500 MG PO CAPS
500.0000 mg | ORAL_CAPSULE | Freq: Two times a day (BID) | ORAL | 0 refills | Status: AC
Start: 2023-06-20 — End: 2023-06-27

## 2023-06-20 MED ORDER — TRIAMCINOLONE ACETONIDE 0.5 % EX CREA: 1.0000 | TOPICAL_CREAM | Freq: Two times a day (BID) | CUTANEOUS | 0 refills | Status: DC

## 2023-06-20 NOTE — Assessment & Plan Note (Signed)
 Acute, unusual nonitchy nonpainful symmetric rash on bilateral lower legs.  She states she has no known exposures.  She is on intermittent chemotherapy drug. Recent lab work within the last 10 days was within the normal range including complete metabolic panel and CBC. The rash is blanching and does not appear vascular. Unlikely cellulitis given bilateral. Most likely cause is allergic dermatitis/contact dermatitis.  Will treat with triamcinolone  0.5% cream twice daily x 2 weeks.  If it is spreading or if she has fever she will call given we have a low threshold for treating possible infection.  At this point this is very unlikely though. She will also discuss it with her oncologist at upcoming visit next week if it is not improving as it could be secondary to her chemo drug.

## 2023-06-20 NOTE — Telephone Encounter (Signed)
 Patient called to advise that she has large, circular red areas on bilateral lower legs.  Was seen by PCP today and prescribed topical cream.  Dr. Cheree Cords reviewed images from PCP visit and recommended adding Keflex  500 mg bid x 7 days if it does not improve or she develops a fever.  Verbalized understanding.  Has follow up next week and will reassess at that time.

## 2023-06-20 NOTE — Progress Notes (Signed)
 Patient ID: Rachel Carlson, female    DOB: 1945/09/24, 78 y.o.   MRN: [id number]  This visit was conducted in person.  BP 112/70   Pulse 91   Temp 98 F (36.7 C) (Temporal)   Ht 5\' 2"  (1.575 m)   Wt 163 lb (73.9 kg)   LMP 05/29/1996   SpO2 98%   BMI 29.81 kg/m    CC:  Chief Complaint  Patient presents with   Pink Area on Legs    Bilateral Lower Legs-No pain or Itching    Subjective:   HPI: SYNDEY Carlson is a 78 y.o. female presenting on 06/20/2023 for Pink Area on Legs (Bilateral Lower Legs-No pain or Itching)   New onset skin change on bilateral lower legs... she has noted new onset rash  in last week.  Symmetric , inner lowe leg.  Not itchy or painful, no burning, no redness. Has varicose veins... no change.  No swelling.   No  new exposure, no change in lotion.  No new meds. No new supplements.  Complete chemotherapy.   Has not treated with anything topical por oral.   No flu  like symptoms. No fever   No calf pain    Family history of melanoma, lynch syndrome    Recent lab work 06/10/2023  Wbc nml  Renal, LFTs nml.   Relevant past medical, surgical, family and social history reviewed and updated as indicated. Interim medical history since our last visit reviewed. Allergies and medications reviewed and updated. Outpatient Medications Prior to Visit  Medication Sig Dispense Refill   apixaban  (ELIQUIS ) 5 MG TABS tablet Take 1 tablet by mouth twice daily 180 tablet 0   Ascorbic Acid  (VITAMIN C ) 1000 MG tablet Take 1,000 mg by mouth daily.     calcium  citrate-vitamin D  (CITRACAL+D) 315-200 MG-UNIT per tablet Take 1 tablet by mouth 2 (two) times daily.     Cholecalciferol (VITAMIN D -3) 5000 UNITS TABS Take 5,000 Units by mouth daily.     Coenzyme Q10 (CO Q-10) 200 MG CAPS Take 200 mg by mouth daily.     CRANBERRY PO Take 650 mg by mouth daily.     Glucosamine-Chondroitin (GLUCOSAMINE CHONDR COMPLEX PO) Take 2 capsules by mouth daily.     GRAPE  SEED EXTRACT PO Take 1 capsule by mouth daily.     ketoconazole  (NIZORAL ) 2 % cream Apply 1 Application topically daily as needed for irritation.     lidocaine -prilocaine  (EMLA ) cream Apply 1 Application topically as needed.     meclizine  (ANTIVERT ) 25 MG tablet TAKE 1 TABLET BY MOUTH THREE TIMES DAILY AS NEEDED FOR NAUSEA OR DIZZINESS 20 tablet 0   milk thistle 175 MG tablet Take 175 mg by mouth daily.     Omega-3 Fatty Acids (FISH OIL TRIPLE STRENGTH PO) Take 1 capsule by mouth daily.     ondansetron  (ZOFRAN ) 8 MG tablet Take 8 mg by mouth every 8 (eight) hours as needed for nausea or vomiting.     Polyethyl Glycol-Propyl Glycol (SYSTANE OP) Place 1 drop into both eyes 4 (four) times daily as needed (for dryness).     PREVACID  24HR 15 MG capsule Take 15 mg by mouth See admin instructions. Take 15 mg by mouth in the morning before any food and an additional 15 mg once a day as needed for unresolved reflux     prochlorperazine  (COMPAZINE ) 10 MG tablet Take 10 mg by mouth every 6 (six) hours as needed for nausea or  vomiting.     rosuvastatin  (CRESTOR ) 20 MG tablet Take 1 tablet (20 mg total) by mouth at bedtime. 90 tablet 3   temazepam  (RESTORIL ) 30 MG capsule Take 1 capsule (30 mg total) by mouth at bedtime as needed for sleep. 30 capsule 0   vitamin B-12 (CYANOCOBALAMIN ) 1000 MCG tablet Take 1,000 mcg by mouth daily.     zinc gluconate 50 MG tablet Take 50 mg by mouth daily.     ketoconazole  (NIZORAL ) 2 % cream Apply 1 Application topically daily. To yeast rash (Patient taking differently: Apply 1 Application topically as needed for irritation (yeast rash). To yeast rash) 30 g 1   No facility-administered medications prior to visit.     Per HPI unless specifically indicated in ROS section below Review of Systems  Constitutional:  Negative for fatigue and fever.  HENT:  Negative for congestion.   Eyes:  Negative for pain.  Respiratory:  Negative for cough and shortness of breath.    Cardiovascular:  Negative for chest pain, palpitations and leg swelling.  Gastrointestinal:  Negative for abdominal pain.  Genitourinary:  Negative for dysuria and vaginal bleeding.  Musculoskeletal:  Negative for back pain.  Skin:  Positive for rash.  Neurological:  Negative for syncope, light-headedness and headaches.  Psychiatric/Behavioral:  Negative for dysphoric mood.    Objective:  BP 112/70   Pulse 91   Temp 98 F (36.7 C) (Temporal)   Ht 5\' 2"  (1.575 m)   Wt 163 lb (73.9 kg)   LMP 05/29/1996   SpO2 98%   BMI 29.81 kg/m   Wt Readings from Last 3 Encounters:  06/20/23 163 lb (73.9 kg)  06/10/23 157 lb 6.5 oz (71.4 kg)  06/07/23 156 lb (70.8 kg)      Physical Exam Constitutional:      General: She is not in acute distress.    Appearance: Normal appearance. She is well-developed. She is not ill-appearing or toxic-appearing.  HENT:     Head: Normocephalic.     Right Ear: Hearing, tympanic membrane, ear canal and external ear normal. Tympanic membrane is not erythematous, retracted or bulging.     Left Ear: Hearing, tympanic membrane, ear canal and external ear normal. Tympanic membrane is not erythematous, retracted or bulging.     Nose: No mucosal edema or rhinorrhea.     Right Sinus: No maxillary sinus tenderness or frontal sinus tenderness.     Left Sinus: No maxillary sinus tenderness or frontal sinus tenderness.     Mouth/Throat:     Pharynx: Uvula midline.  Eyes:     General: Lids are normal. Lids are everted, no foreign bodies appreciated.     Conjunctiva/sclera: Conjunctivae normal.     Pupils: Pupils are equal, round, and reactive to light.  Neck:     Thyroid : No thyroid  mass or thyromegaly.     Vascular: No carotid bruit.     Trachea: Trachea normal.  Cardiovascular:     Rate and Rhythm: Normal rate and regular rhythm.     Pulses: Normal pulses.     Heart sounds: Normal heart sounds, S1 normal and S2 normal. No murmur heard.    No friction rub. No  gallop.  Pulmonary:     Effort: Pulmonary effort is normal. No tachypnea or respiratory distress.     Breath sounds: Normal breath sounds. No decreased breath sounds, wheezing, rhonchi or rales.  Abdominal:     General: Bowel sounds are normal.     Palpations: Abdomen is  soft.     Tenderness: There is no abdominal tenderness.  Musculoskeletal:     Cervical back: Normal range of motion and neck supple.  Skin:    General: Skin is warm and dry.     Findings: No rash.  Neurological:     Mental Status: She is alert.  Psychiatric:        Mood and Affect: Mood is not anxious or depressed.        Speech: Speech normal.        Behavior: Behavior normal. Behavior is cooperative.        Thought Content: Thought content normal.        Judgment: Judgment normal.          Results for orders placed or performed in visit on 06/13/23  POCT Urinalysis Dipstick   Collection Time: 06/13/23 11:46 AM  Result Value Ref Range   Color, UA yellow    Clarity, UA clear    Glucose, UA Negative Negative   Bilirubin, UA negative    Ketones, UA negative    Spec Grav, UA <=1.005 (A) 1.010 - 1.025   Blood, UA negative    pH, UA 6.5 5.0 - 8.0   Protein, UA Negative Negative   Urobilinogen, UA 0.2 0.2 or 1.0 E.U./dL   Nitrite, UA negative    Leukocytes, UA Negative Negative   Appearance     Odor      Assessment and Plan  Rash Assessment & Plan: Acute, unusual nonitchy nonpainful symmetric rash on bilateral lower legs.  She states she has no known exposures.  She is on intermittent chemotherapy drug. Recent lab work within the last 10 days was within the normal range including complete metabolic panel and CBC. The rash is blanching and does not appear vascular. Unlikely cellulitis given bilateral. Most likely cause is allergic dermatitis/contact dermatitis.  Will treat with triamcinolone 0.5% cream twice daily x 2 weeks.  If it is spreading or if she has fever she will call given we have a low  threshold for treating possible infection.  At this point this is very unlikely though. She will also discuss it with her oncologist at upcoming visit next week if it is not improving as it could be secondary to her chemo drug.   Other orders -     Triamcinolone Acetonide; Apply 1 Application topically 2 (two) times daily.  Dispense: 30 g; Refill: 0    Return if symptoms worsen or fail to improve.   Herby Lolling, MD

## 2023-06-20 NOTE — Patient Instructions (Signed)
 Apply topical steroid twice daily  x 2 week.  If redness spreading or fever... call ASAP.  If not improving  discuss with oncologist.

## 2023-06-25 NOTE — Progress Notes (Signed)
 [facility] [address], [place] [address]    Clinic Day:  06/26/2023  Referring physician: Tower, Manley Seeds, MD  Patient Care Team: Tower, Manley Seeds, MD as PCP - General [name], MD as PCP - Cardiology (Cardiology) [name], MD as Medical Oncologist (Medical Oncology) [name], RN as Oncology Nurse Navigator (Medical Oncology)   ASSESSMENT & PLAN:   Assessment: 1.  Peritoneal carcinomatosis/ovarian cancer: - Presentation: 23-month history of abdominal distention and bloating. - Last colonoscopy (01/01/2020): No evidence of malignancy. - CTAP (08/23/2022): Moderate ascites, probable omental caking in the left lower quadrant, possible small peritoneal implants seen in the pelvis.  Diverticulosis of descending and sigmoid colon with moderate wall thickening of the distal sigmoid colon.  Small bilateral pleural effusions. - 08/26/2022: CA125: 7356, CEA: 1 - Germline mutation testing (09/16/2017): Negative. - 4 cycles of carboplatin  and paclitaxel  from 09/12/2022 through 11/14/2022 - 12/12/2022: Tumor debulking surgery including total omentectomy, resection of peritoneal disease, small bowel resection with reanastomosis, en bloc resection of the left fallopian tube and ovary as well as rectosigmoid colon, left ureterolysis, right salpingo-oophorectomy, end descending colostomy by Dr. Orvil Bland - Pathology: Omental resection-high-grade serous carcinoma, cecal implant-high-grade serous carcinoma, ileal resection-mesenteric and serosal involvement by high-grade serous carcinoma, left lower abdominal wall nodule-high-grade serous carcinoma, right tube and ovary resection-high-grade serous carcinoma involving serosa, bladder peritoneum and rectosigmoid colon-high-grade serous carcinoma involving colonic wall and extensive involvement of the serosa and mesenteric adipose tissue.  Metastatic carcinoma involving 8/13 nodes.  pT3 cpN1a. - NGS: MSI-stable,  TMB-low, HRD-negative, T p53 pathogenic variant, PD-L1 (22 C3) CPS 5, ER 2+ positive, FOLR1 2+, 75% (mirvetuximab) - 4 cycles of carboplatin  and paclitaxel  from 01/17/2023 through 03/25/2023 - Germline mutation testing: Negative - [name] started on 04/22/2023, temporarily held during hospitalization from 05/16/2023 through 06/01/2023 with partial small bowel obstruction, restarted back on 06/10/2023   2. Social/Family History: -She lives at home with husband and is his primary caregiver. She is independent ADLS and iADLS. Retired International aid/development worker. No tobacco use.  -She had skin squamous cell carcinoma. Her brother had colon cancer at 42 and follicular non-Hodgkin's lymphoma and DLBCL, her maternal grandfather died of prostate cancer, maternal first cousin had colon cancer, and maternal aunt died of lung cancer (smoker).     Plan: 1.  Peritoneal carcinomatosis/ovarian cancer: - She is tolerating niraparib  very well.  No GI side effects noted. - She is continuing to eat soft foods.  No vomiting reported in the last few weeks. - Reviewed labs from 06/26/2023: Normal LFTs and creatinine.  CBC grossly normal.  CA125 is pending. - Continue niraparib  200 mg daily.  She is following up with Dr. Orvil Bland next month.  I will see her back in 6 weeks with repeat CT CAP and tumor marker.   2.  Left pleural effusion: - Last left thoracentesis on 03/29/2023, 1.2 L removed.  Prior thoracentesis on 03/06/2023, 2 L removed. - Decreased breath sounds in the left lung base have been stable.  Denies any worsening of shortness of breath.   3.  Left gastrocnemius/calf vein distal DVT (Dx 07/24/2022): - Continue Eliquis  twice daily.  No issues with bleeding noted.   4.  Peripheral neuropathy: - Tingling in the fingertips is constant and stable.   5.  Insomnia: - Continue temazepam  at bedtime as needed.    Orders Placed This Encounter  Procedures   CT CHEST ABDOMEN PELVIS W CONTRAST    Standing Status:  Future    Expected Date:   07/27/2023    Expiration Date:   06/25/2024    If indicated for the ordered procedure, I authorize the administration of contrast media per Radiology protocol:   Yes    Does the patient have a contrast media/X-ray dye allergy?:   No    Preferred imaging location?:   [facility]    Release to patient:   Immediate    If indicated for the ordered procedure, I authorize the administration of oral contrast media per Radiology protocol:   Yes   CA 125      I,[name] R Teague,acting as a scribe for [name], MD.,have documented all relevant documentation on the behalf of [name], MD,as directed by  [name], MD while in the presence of [name], MD.  I, [name] MD, have reviewed the above documentation for accuracy and completeness, and I agree with the above.      [name], MD   5/28/20253:05 PM  CHIEF COMPLAINT:   Diagnosis: Peritoneal carcinomatosis/ovarian cancer    Cancer Staging  No matching staging information was found for the patient.    Prior Therapy: 1. Neoadjuvant carboplatin  and paclitaxel , 4 cycles, 09/12/22 - 11/14/22 2. Tumor debulking surgery on 12/12/22  Current Therapy:  adjuvant carboplatin  and paclitaxel     HISTORY OF PRESENT ILLNESS:   Oncology History Overview Note  CARIS: FOLR1 2+ (75%) ER(2+) HRD-, PDL1 CPS 5%, p53 mutation No mutation in BRCA1/2, ATM, ERBB2, KRAS PR negative   Carcinomatosis (HCC)  08/31/2022 Initial Diagnosis   Carcinomatosis (HCC)   09/12/2022 - 03/25/2023 Chemotherapy   Patient is on Treatment Plan : OVARIAN Carboplatin  (AUC 6) + Paclitaxel  (175) q21d X 6 Cycles     Gynecologic malignancy (HCC)  12/12/2022 Initial Diagnosis   Gynecologic malignancy (HCC)   02/19/2023 Genetic Testing   Negative genetic testing on the Multi-cancer + RNA panel.  The report date is February 19, 2023.  The Multi-Cancer + RNA Panel offered by  [facility] includes sequencing and/or deletion/duplication analysis of the following 70 genes:  AIP*, ALK, APC*, ATM*, AXIN2*, BAP1*, BARD1*, BLM*, BMPR1A*, BRCA1*, BRCA2*, BRIP1*, CDC73*, CDH1*, CDK4, CDKN1B*, CDKN2A, CHEK2*, CTNNA1*, DICER1*, EPCAM (del/dup only), EGFR, FH*, FLCN*, GREM1 (promoter dup only), HOXB13, KIT, LZTR1, MAX*, MBD4, MEN1*, MET, MITF, MLH1*, MSH2*, MSH3*, MSH6*, MUTYH*, NF1*, NF2*, NTHL1*, PALB2*, PDGFRA, PMS2*, POLD1*, POLE*, POT1*, PRKAR1A*, PTCH1*, PTEN*, RAD51C*, RAD51D*, RB1*, RET, SDHA* (sequencing only), SDHAF2*, SDHB*, SDHC*, SDHD*, SMAD4*, SMARCA4*, SMARCB1*, SMARCE1*, STK11*, SUFU*, TMEM127*, TP53*, TSC1*, TSC2*, VHL*. RNA analysis is performed for * genes.   Negative genetic testing on the common hereditary cancer panel.  The Hereditary Gene Panel offered by [facility] includes sequencing and/or deletion duplication testing of the following 47 genes: APC, ATM, AXIN2, BARD1, BMPR1A, BRCA1, BRCA2, BRIP1, CDH1, CDK4, CDKN2A (p14ARF), CDKN2A (p16INK4a), CHEK2, CTNNA1, DICER1, EPCAM (Deletion/duplication testing only), GREM1 (promoter region deletion/duplication testing only), KIT, MEN1, MLH1, MSH2, MSH3, MSH6, MUTYH, NBN, NF1, NHTL1, PALB2, PDGFRA, PMS2, POLD1, POLE, PTEN, RAD50, RAD51C, RAD51D, SDHB, SDHC, SDHD, SMAD4, SMARCA4. STK11, TP53, TSC1, TSC2, and VHL.  The following genes were evaluated for sequence changes only: SDHA and HOXB13 c.251G>A variant only. The report date is September 25, 2017.      INTERVAL HISTORY:   Rachel Carlson is a 78 y.o. female presenting to clinic today for follow up of Peritoneal carcinomatosis/ovarian cancer. She was last seen by me on 06/10/23.  Today, she states that she is doing well overall. Her appetite level is  at 50%. Her energy level is at 25%.  PAST MEDICAL HISTORY:   Past Medical History: Past Medical History:  Diagnosis Date   Blood transfusion without reported diagnosis    Chronic right ear pain    normal MRI   Diverticulosis    DVT  of leg (deep venous thrombosis) (HCC)    Esophageal stricture    Family history of breast cancer    Family history of colon cancer    Family history of melanoma    Family history of prostate cancer    Fibroids 1998   uterine, history of (left ovaries)   GERD (gastroesophageal reflux disease)    History of hiatal hernia    History of uterine prolapse    Hx of adenomatous polyp of colon 10/19/2014   Hyperlipidemia    Hypertension    borderline no meds diet and exercise   Nausea and vomiting 05/19/2023   Neuromuscular disorder (HCC)    neuropathy from chemo   Obesity    Ovarian cancer (HCC)    Peritoneal carcinomatosis (HCC)    PVC (premature ventricular contraction)    first dx by holter in 1980's, echo (6/10) EF 55-60%, normal diastolic fxn, normal size RV and fxn, mild MR, PASP   Squamous cell carcinoma of arm    ovarian   Tubal pregnancy    Ulcerative proctosigmoiditis (HCC)    Urge urinary incontinence 06/03/2007   Qualifier: Diagnosis of  By: Lennice Quivers CMA, Kelly Patient      Surgical History: Past Surgical History:  Procedure Laterality Date   BILATERAL SALPINGECTOMY Bilateral 12/12/2022   Procedure: OPEN BILATERAL SALPINGECTOMY-OOPHORECTOMY;  Surgeon: Suzi Essex, MD;  Location: [facility];  Service: Gynecology;  Laterality: Bilateral;   BLADDER NECK SUSPENSION  1998   BREAST BIOPSY Left    BREAST LUMPECTOMY Left    benign   CATARACT EXTRACTION Right 01/2020   CATARACT EXTRACTION W/PHACO Left 08/12/2020   Procedure: CATARACT EXTRACTION PHACO AND INTRAOCULAR LENS PLACEMENT LEFT EYE;  Surgeon: Tarri Farm, MD;  Location: [facility];  Service: Ophthalmology;  Laterality: Left;  left CDE=10.71   COLONOSCOPY  05/18/2011/10/12/14   Dr. Loy Ruff   DEBULKING N/A 12/12/2022   Procedure: RADICAL TUMOR DEBULKING, PERITONEAL STRIPPING, MOBILIZATION SPLENIC FLEXURE, RECTOSIGMOID RESECTION,SMALL BOWEL RESECTION AND REANASTOMOSIS ,END TO END DESCENDING COLOSTOMY;   Surgeon: Suzi Essex, MD;  Location: [facility];  Service: Gynecology;  Laterality: N/A;   ECTOPIC PREGNANCY SURGERY  1980   ESOPHAGOGASTRODUODENOSCOPY (EGD) WITH ESOPHAGEAL DILATION  04/03/2012   Dr. Loy Ruff   INCISION AND DRAINAGE ABSCESS N/A 01/02/2023   Procedure: INCISION AND DRAINAGE OF ABDOMINAL WOUND SEROMA;  Surgeon: Abdul Hodgkin, MD;  Location: [facility];  Service: Gynecology;  Laterality: N/A;   IR IMAGING GUIDED PORT INSERTION  09/06/2022   OMENTECTOMY N/A 12/12/2022   Procedure: OMENTECTOMY;  Surgeon: Suzi Essex, MD;  Location: [facility];  Service: Gynecology;  Laterality: N/A;   PARTIAL HYSTERECTOMY     abdominal, fibroids, and prolapse (1998) bladder tack   SALPINGOOPHORECTOMY Bilateral 12/12/2022   Procedure: DIAGNOSTIC LAPAROSCOPY;  Surgeon: Suzi Essex, MD;  Location: [facility];  Service: Gynecology;  Laterality: Bilateral;   SKIN CANCER EXCISION     UPPER GASTROINTESTINAL ENDOSCOPY      Social History: Social History   Socioeconomic History   Marital status: Married    Spouse name: Not on file   Number of children: 3   Years of education: Not on file   Highest education level:  Not on file  Occupational History   Occupation: International aid/development worker    Comment: retired 2009    Employer: unemployed  Tobacco Use   Smoking status: Never   Smokeless tobacco: Never  Vaping Use   Vaping status: Never Used  Substance and Sexual Activity   Alcohol  use: No    Alcohol /week: 0.0 standard drinks of alcohol     Comment: rarely beer   Drug use: No   Sexual activity: Not Currently  Other Topics Concern   Not on file  Social History Narrative   Retired International aid/development worker, 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)   Grown children   Never smoker no alcohol  tobacco or drug use   Social Drivers of Corporate investment banker Strain: Low Risk  (08/28/2022)   Overall Financial Resource Strain (CARDIA)    Difficulty of Paying Living Expenses: Not hard at all  Food  Insecurity: No Food Insecurity (05/16/2023)   Hunger Vital Sign    Worried About Running Out of Food in the Last Year: Never true    Ran Out of Food in the Last Year: Never true  Transportation Needs: No Transportation Needs (05/16/2023)   PRAPARE - Administrator, Civil Service (Medical): No    Lack of Transportation (Non-Medical): No  Physical Activity: Inactive (08/28/2022)   Exercise Vital Sign    Days of Exercise per Week: 0 days    Minutes of Exercise per Session: 0 min  Stress: No Stress Concern Present (08/28/2022)   [employer] of Occupational Health - Occupational Stress Questionnaire    Feeling of Stress : Not at all  Social Connections: Moderately Integrated (05/16/2023)   Social Connection and Isolation Panel [NHANES]    Frequency of Communication with Friends and Family: More than three times a week    Frequency of Social Gatherings with Friends and Family: More than three times a week    Attends Religious Services: Never    Database administrator or Organizations: Yes    Attends Engineer, structural: More than 4 times per year    Marital Status: Married  Catering manager Violence: Not At Risk (05/16/2023)   Humiliation, Afraid, Rape, and Kick questionnaire    Fear of Current or Ex-Partner: No    Emotionally Abused: No    Physically Abused: No    Sexually Abused: No    Family History: Family History  Problem Relation Age of Onset   Heart attack Mother 26   Transient ischemic attack Mother    Diabetes type II Mother    Sudden death Mother        died age 72   Diabetes Mother    Coronary artery disease Mother    Skin cancer Mother    Barrett's esophagus Mother    Sudden death Father 20       "coronary arteriosclerosis" on death certificate   Breast cancer Sister        dx in her 51s   Colon polyps Sister        adenomatous   Arrhythmia Brother    Colon cancer Brother 85   Congestive Heart Failure Maternal Grandmother    Prostate  cancer Maternal Grandfather        dx in 47s   Tuberculosis Paternal Grandfather    Ehlers-Danlos syndrome Daughter    Colon cancer Son 25       lynch syndrome   Other Son        parotid gland tumor  Skin cancer Maternal Aunt    Barrett's esophagus Maternal Aunt    Melanoma Maternal Uncle    Colon cancer Cousin        mid 33s; maternal cousin   Colon cancer Other        MGMs brother   Esophageal cancer Neg Hx    Stomach cancer Neg Hx    Rectal cancer Neg Hx    Uterine cancer Neg Hx    Bladder Cancer Neg Hx     Current Medications:  Current Outpatient Medications:    apixaban  (ELIQUIS ) 5 MG TABS tablet, Take 1 tablet by mouth twice daily, Disp: 180 tablet, Rfl: 0   Ascorbic Acid  (VITAMIN C ) 1000 MG tablet, Take 1,000 mg by mouth daily., Disp: , Rfl:    calcium  citrate-vitamin D  (CITRACAL+D) 315-200 MG-UNIT per tablet, Take 1 tablet by mouth 2 (two) times daily., Disp: , Rfl:    cephALEXin  (KEFLEX ) 500 MG capsule, Take 1 capsule (500 mg total) by mouth 2 (two) times daily for 7 days., Disp: 14 capsule, Rfl: 0   Cholecalciferol (VITAMIN D -3) 5000 UNITS TABS, Take 5,000 Units by mouth daily., Disp: , Rfl:    Coenzyme Q10 (CO Q-10) 200 MG CAPS, Take 200 mg by mouth daily., Disp: , Rfl:    CRANBERRY PO, Take 650 mg by mouth daily., Disp: , Rfl:    Glucosamine-Chondroitin (GLUCOSAMINE CHONDR COMPLEX PO), Take 2 capsules by mouth daily., Disp: , Rfl:    GRAPE SEED EXTRACT PO, Take 1 capsule by mouth daily., Disp: , Rfl:    ketoconazole  (NIZORAL ) 2 % cream, Apply 1 Application topically daily as needed for irritation., Disp: , Rfl:    lidocaine -prilocaine  (EMLA ) cream, Apply 1 Application topically as needed., Disp: , Rfl:    meclizine  (ANTIVERT ) 25 MG tablet, TAKE 1 TABLET BY MOUTH THREE TIMES DAILY AS NEEDED FOR NAUSEA OR DIZZINESS, Disp: 20 tablet, Rfl: 0   milk thistle 175 MG tablet, Take 175 mg by mouth daily., Disp: , Rfl:    Omega-3 Fatty Acids (FISH OIL TRIPLE STRENGTH PO), Take  1 capsule by mouth daily., Disp: , Rfl:    ondansetron  (ZOFRAN ) 8 MG tablet, Take 8 mg by mouth every 8 (eight) hours as needed for nausea or vomiting., Disp: , Rfl:    Polyethyl Glycol-Propyl Glycol (SYSTANE OP), Place 1 drop into both eyes 4 (four) times daily as needed (for dryness)., Disp: , Rfl:    PREVACID  24HR 15 MG capsule, Take 15 mg by mouth See admin instructions. Take 15 mg by mouth in the morning before any food and an additional 15 mg once a day as needed for unresolved reflux, Disp: , Rfl:    prochlorperazine  (COMPAZINE ) 10 MG tablet, Take 10 mg by mouth every 6 (six) hours as needed for nausea or vomiting., Disp: , Rfl:    rosuvastatin  (CRESTOR ) 20 MG tablet, Take 1 tablet (20 mg total) by mouth at bedtime., Disp: 90 tablet, Rfl: 3   temazepam  (RESTORIL ) 30 MG capsule, Take 1 capsule (30 mg total) by mouth at bedtime as needed for sleep., Disp: 30 capsule, Rfl: 0   triamcinolone cream (KENALOG) 0.5 %, Apply 1 Application topically 2 (two) times daily., Disp: 30 g, Rfl: 0   vitamin B-12 (CYANOCOBALAMIN ) 1000 MCG tablet, Take 1,000 mcg by mouth daily., Disp: , Rfl:    zinc gluconate 50 MG tablet, Take 50 mg by mouth daily., Disp: , Rfl:    niraparib  tosylate (ZEJULA ) 200 MG tablet, Take 1 tablet (200  mg total) by mouth daily. May take at bedtime to reduce nausea and vomiting., Disp: 30 tablet, Rfl: 1   Allergies: Allergies  Allergen Reactions   Aspartame     Intolerance to artifical sweeteners, pt states caused burning of bladder   Levaquin  [Levofloxacin ] Nausea Only   Pantoprazole  Sodium Diarrhea    Abdominal pain   Ranitidine Diarrhea    abdominal pain   Omeprazole  Diarrhea    Abdominal pain  Other Reaction(s): Not available   Sulfasalazine  Rash and Other (See Comments)    Fever, chills, headache, muscle pain also    REVIEW OF SYSTEMS:   Review of Systems  Constitutional:  Negative for chills, fatigue and fever.  HENT:   Negative for lump/mass, mouth sores,  nosebleeds, sore throat and trouble swallowing.   Eyes:  Negative for eye problems.  Respiratory:  Negative for cough and shortness of breath.   Cardiovascular:  Negative for chest pain, leg swelling and palpitations.  Gastrointestinal:  Positive for nausea. Negative for abdominal pain, constipation, diarrhea and vomiting.  Genitourinary:  Negative for bladder incontinence, difficulty urinating, dysuria, frequency, hematuria and nocturia.   Musculoskeletal:  Negative for arthralgias, back pain, flank pain, myalgias and neck pain.  Skin:  Negative for itching and rash.  Neurological:  Positive for dizziness. Negative for headaches and numbness.  Hematological:  Does not bruise/bleed easily.  Psychiatric/Behavioral:  Negative for depression, sleep disturbance and suicidal ideas. The patient is not nervous/anxious.   All other systems reviewed and are negative.    VITALS:   Blood pressure 121/80, pulse (!) 103, temperature 98 F (36.7 C), temperature source Oral, resp. rate 16, weight 160 lb 0.9 oz (72.6 kg), last menstrual period 05/29/1996, SpO2 97%.  Wt Readings from Last 3 Encounters:  06/26/23 160 lb 0.9 oz (72.6 kg)  06/20/23 163 lb (73.9 kg)  06/10/23 157 lb 6.5 oz (71.4 kg)    Body mass index is 29.27 kg/m.  Performance status (ECOG): 1 - Symptomatic but completely ambulatory  PHYSICAL EXAM:   Physical Exam Vitals and nursing note reviewed. Exam conducted with a chaperone present.  Constitutional:      Appearance: Normal appearance.  Cardiovascular:     Rate and Rhythm: Normal rate and regular rhythm.     Pulses: Normal pulses.     Heart sounds: Normal heart sounds.  Pulmonary:     Effort: Pulmonary effort is normal.     Breath sounds: Normal breath sounds.  Abdominal:     Palpations: Abdomen is soft. There is no hepatomegaly, splenomegaly or mass.     Tenderness: There is no abdominal tenderness.  Musculoskeletal:     Right lower leg: No edema.     Left lower leg:  No edema.  Lymphadenopathy:     Cervical: No cervical adenopathy.     Right cervical: No superficial, deep or posterior cervical adenopathy.    Left cervical: No superficial, deep or posterior cervical adenopathy.     Upper Body:     Right upper body: No supraclavicular or axillary adenopathy.     Left upper body: No supraclavicular or axillary adenopathy.  Neurological:     General: No focal deficit present.     Mental Status: She is alert and oriented to person, place, and time.  Psychiatric:        Mood and Affect: Mood normal.        Behavior: Behavior normal.     LABS:   CBC     Component Value Date/Time  WBC 4.4 06/26/2023 1212   RBC 3.51 (L) 06/26/2023 1212   HGB 11.5 (L) 06/26/2023 1212   HGB 11.0 (L) 04/26/2023 1043   HCT 33.8 (L) 06/26/2023 1212   PLT 206 06/26/2023 1212   PLT 199 04/26/2023 1043   MCV 96.3 06/26/2023 1212   MCH 32.8 06/26/2023 1212   MCHC 34.0 06/26/2023 1212   RDW 15.9 (H) 06/26/2023 1212   LYMPHSABS 0.8 06/26/2023 1212   MONOABS 0.5 06/26/2023 1212   EOSABS 0.0 06/26/2023 1212   BASOSABS 0.0 06/26/2023 1212    CMP      Component Value Date/Time   NA 133 (L) 06/26/2023 1212   K 3.9 06/26/2023 1212   CL 101 06/26/2023 1212   CO2 21 (L) 06/26/2023 1212   GLUCOSE 100 (H) 06/26/2023 1212   BUN 20 06/26/2023 1212   CREATININE 0.59 06/26/2023 1212   CREATININE 0.64 04/26/2023 1043   CALCIUM  9.6 06/26/2023 1212   PROT 6.7 06/26/2023 1212   ALBUMIN  3.7 06/26/2023 1212   AST 20 06/26/2023 1212   AST 11 (L) 04/26/2023 1043   ALT 15 06/26/2023 1212   ALT 7 04/26/2023 1043   ALKPHOS 78 06/26/2023 1212   BILITOT 0.7 06/26/2023 1212   BILITOT 0.7 04/26/2023 1043   GFRNONAA >60 06/26/2023 1212   GFRNONAA >60 04/26/2023 1043   GFRAA 94 06/04/2007 1522     Lab Results  Component Value Date   CEA1 1.0 08/26/2022   /  CEA  Date Value Ref Range Status  08/26/2022 1.0 0.0 - 4.7 ng/mL Final    Comment:    (NOTE)                              Nonsmokers          <3.9                             Smokers             <5.6 [employer] Electrochemiluminescence Immunoassay (ECLIA) Values obtained with different assay methods or kits cannot be used interchangeably.  Results cannot be interpreted as absolute evidence of the presence or absence of malignant disease. Performed At: [facility] [address] [place], [place] [id number] Pearlean Botts MD WU:[id number]    No results found for: "PSA1" No results found for: "CAN199" Lab Results  Component Value Date   CAN125 439.0 (H) 05/13/2023    No results found for: "TOTALPROTELP", "ALBUMINELP", "A1GS", "A2GS", "BETS", "BETA2SER", "GAMS", "MSPIKE", "SPEI" Lab Results  Component Value Date   TIBC 369 10/24/2022   FERRITIN 22 10/24/2022   IRONPCTSAT 10 (L) 10/24/2022   No results found for: "LDH"   STUDIES:   DG CHEST PORT 1 VIEW Result Date: 05/30/2023 CLINICAL DATA:  Pleural effusion. EXAM: PORTABLE CHEST 1 VIEW COMPARISON:  03/29/2023 FINDINGS: Increased blunting at the left costophrenic angle is suggestive for a small left pleural effusion. Slightly prominent lung markings appear chronic. No significant right pleural effusion. Retrocardiac opacity is compatible with a hiatal hernia. Right jugular Port-A-Cath tip is near the superior cavoatrial junction. This is a CT injectable port. Trachea is midline. IMPRESSION: 1. Blunting at the left costophrenic angle is suggestive for a small left pleural effusion. 2. Hiatal hernia. Electronically Signed   By: [name] M.D.   On: 05/30/2023 09:22

## 2023-06-26 ENCOUNTER — Inpatient Hospital Stay: Admitting: Hematology

## 2023-06-26 ENCOUNTER — Other Ambulatory Visit: Payer: Self-pay

## 2023-06-26 ENCOUNTER — Inpatient Hospital Stay

## 2023-06-26 VITALS — BP 121/80 | HR 103 | Temp 98.0°F | Resp 16 | Wt 160.1 lb

## 2023-06-26 DIAGNOSIS — Z7901 Long term (current) use of anticoagulants: Secondary | ICD-10-CM | POA: Diagnosis not present

## 2023-06-26 DIAGNOSIS — C786 Secondary malignant neoplasm of retroperitoneum and peritoneum: Secondary | ICD-10-CM

## 2023-06-26 DIAGNOSIS — C800 Disseminated malignant neoplasm, unspecified: Secondary | ICD-10-CM | POA: Diagnosis not present

## 2023-06-26 DIAGNOSIS — J90 Pleural effusion, not elsewhere classified: Secondary | ICD-10-CM | POA: Diagnosis not present

## 2023-06-26 DIAGNOSIS — Z95828 Presence of other vascular implants and grafts: Secondary | ICD-10-CM

## 2023-06-26 DIAGNOSIS — C482 Malignant neoplasm of peritoneum, unspecified: Secondary | ICD-10-CM | POA: Diagnosis not present

## 2023-06-26 DIAGNOSIS — G629 Polyneuropathy, unspecified: Secondary | ICD-10-CM | POA: Diagnosis not present

## 2023-06-26 DIAGNOSIS — G4700 Insomnia, unspecified: Secondary | ICD-10-CM | POA: Diagnosis not present

## 2023-06-26 DIAGNOSIS — Z800 Family history of malignant neoplasm of digestive organs: Secondary | ICD-10-CM | POA: Diagnosis not present

## 2023-06-26 DIAGNOSIS — Z86718 Personal history of other venous thrombosis and embolism: Secondary | ICD-10-CM | POA: Diagnosis not present

## 2023-06-26 DIAGNOSIS — K56699 Other intestinal obstruction unspecified as to partial versus complete obstruction: Secondary | ICD-10-CM | POA: Diagnosis not present

## 2023-06-26 DIAGNOSIS — Z803 Family history of malignant neoplasm of breast: Secondary | ICD-10-CM | POA: Diagnosis not present

## 2023-06-26 DIAGNOSIS — Z9221 Personal history of antineoplastic chemotherapy: Secondary | ICD-10-CM | POA: Diagnosis not present

## 2023-06-26 DIAGNOSIS — Z452 Encounter for adjustment and management of vascular access device: Secondary | ICD-10-CM | POA: Diagnosis not present

## 2023-06-26 LAB — COMPREHENSIVE METABOLIC PANEL WITH GFR
ALT: 15 U/L (ref 0–44)
AST: 20 U/L (ref 15–41)
Albumin: 3.7 g/dL (ref 3.5–5.0)
Alkaline Phosphatase: 78 U/L (ref 38–126)
Anion gap: 11 (ref 5–15)
BUN: 20 mg/dL (ref 8–23)
CO2: 21 mmol/L — ABNORMAL LOW (ref 22–32)
Calcium: 9.6 mg/dL (ref 8.9–10.3)
Chloride: 101 mmol/L (ref 98–111)
Creatinine, Ser: 0.59 mg/dL (ref 0.44–1.00)
GFR, Estimated: >60 mL/min (ref >60)
Glucose, Bld: 100 mg/dL — ABNORMAL HIGH (ref 70–99)
Potassium: 3.9 mmol/L (ref 3.5–5.1)
Sodium: 133 mmol/L — ABNORMAL LOW (ref 135–145)
Total Bilirubin: 0.7 mg/dL (ref 0.0–1.2)
Total Protein: 6.7 g/dL (ref 6.5–8.1)

## 2023-06-26 LAB — CBC WITH DIFFERENTIAL/PLATELET
Abs Immature Granulocytes: 0.01 10*3/uL (ref 0.00–0.07)
Basophils Absolute: 0 10*3/uL (ref 0.0–0.1)
Basophils Relative: 1 %
Eosinophils Absolute: 0 10*3/uL (ref 0.0–0.5)
Eosinophils Relative: 1 %
HCT: 33.8 % — ABNORMAL LOW (ref 36.0–46.0)
Hemoglobin: 11.5 g/dL — ABNORMAL LOW (ref 12.0–15.0)
Immature Granulocytes: 0 %
Lymphocytes Relative: 18 %
Lymphs Abs: 0.8 10*3/uL (ref 0.7–4.0)
MCH: 32.8 pg (ref 26.0–34.0)
MCHC: 34 g/dL (ref 30.0–36.0)
MCV: 96.3 fL (ref 80.0–100.0)
Monocytes Absolute: 0.5 10*3/uL (ref 0.1–1.0)
Monocytes Relative: 10 %
Neutro Abs: 3.1 10*3/uL (ref 1.7–7.7)
Neutrophils Relative %: 70 %
Platelets: 206 10*3/uL (ref 150–400)
RBC: 3.51 MIL/uL — ABNORMAL LOW (ref 3.87–5.11)
RDW: 15.9 % — ABNORMAL HIGH (ref 11.5–15.5)
WBC: 4.4 10*3/uL (ref 4.0–10.5)
nRBC: 0 % (ref 0.0–0.2)

## 2023-06-26 LAB — MAGNESIUM: Magnesium: 2 mg/dL (ref 1.7–2.4)

## 2023-06-26 MED ORDER — SODIUM CHLORIDE 0.9% FLUSH
10.0000 mL | INTRAVENOUS | Status: DC | PRN
  Administered 2023-06-26: 10 mL via INTRAVENOUS

## 2023-06-26 MED ORDER — NIRAPARIB TOSYLATE 200 MG PO TABS
200.0000 mg | ORAL_TABLET | Freq: Every day | ORAL | 1 refills | Status: DC
  Filled 2023-06-26 – 2023-07-04 (×2): qty 30, 30d supply, fill #0
  Filled 2023-07-30: qty 30, 30d supply, fill #1

## 2023-06-26 MED ORDER — HEPARIN SOD (PORK) LOCK FLUSH 100 UNIT/ML IV SOLN
500.0000 [IU] | Freq: Once | INTRAVENOUS | Status: AC
  Administered 2023-06-26: 500 [IU] via INTRAVENOUS

## 2023-06-26 NOTE — Progress Notes (Signed)
 Patients port flushed without difficulty.  Good blood return noted with no bruising or swelling noted at site.  Band aid applied.  VSS with discharge and left in satisfactory condition with no s/s of distress noted.

## 2023-06-26 NOTE — Patient Instructions (Signed)
 [facility] at [facility] Discharge Instructions   You were seen and examined today by Dr. Cheree Cords.  He reviewed the results of your lab work which are normal/stable.   We will see you back in 6 weeks. We will repeat lab work and a CT scan prior to this visit.   Return as scheduled.    Thank you for choosing [facility] at [facility] to provide your oncology and hematology care.  To afford each patient quality time with our provider, please arrive at least 15 minutes before your scheduled appointment time.   If you have a lab appointment with the Cancer Center please come in thru the Main Entrance and check in at the main information desk.  You need to re-schedule your appointment should you arrive 10 or more minutes late.  We strive to give you quality time with our providers, and arriving late affects you and other patients whose appointments are after yours.  Also, if you no show three or more times for appointments you may be dismissed from the clinic at the providers discretion.     Again, thank you for choosing [facility].  Our hope is that these requests will decrease the amount of time that you wait before being seen by our physicians.       _____________________________________________________________  Should you have questions after your visit to [facility], please contact our office at [phone] and follow the prompts.  Our office hours are 8:00 a.m. and 4:30 p.m. Monday - Friday.  Please note that voicemails left after 4:00 p.m. may not be returned until the following business day.  We are closed weekends and major holidays.  You do have access to a nurse 24-7, just call the main number to the clinic [phone] and do not press any options, hold on the line and a nurse will answer the phone.    For prescription refill requests, have your pharmacy contact our office and allow 72 hours.    Due to  Covid, you will need to wear a mask upon entering the hospital. If you do not have a mask, a mask will be given to you at the Main Entrance upon arrival. For doctor visits, patients may have 1 support person age 47 or older with them. For treatment visits, patients can not have anyone with them due to social distancing guidelines and our immunocompromised population.

## 2023-06-26 NOTE — Progress Notes (Signed)
 Patient is taking Zejula as prescribed.  She has not missed any doses and reports no side effects at this time.

## 2023-06-27 LAB — CA 125: Cancer Antigen (CA) 125: 838 U/mL — ABNORMAL HIGH (ref 0.0–38.1)

## 2023-07-01 ENCOUNTER — Inpatient Hospital Stay: Admitting: Dietician

## 2023-07-01 ENCOUNTER — Telehealth: Payer: Self-pay | Admitting: Dietician

## 2023-07-01 DIAGNOSIS — K9409 Other complications of colostomy: Secondary | ICD-10-CM | POA: Diagnosis not present

## 2023-07-01 DIAGNOSIS — C481 Malignant neoplasm of specified parts of peritoneum: Secondary | ICD-10-CM | POA: Diagnosis not present

## 2023-07-01 NOTE — Telephone Encounter (Signed)
 Attempted to contact patient via telephone for nutrition follow-up. Patient husband answered. Reports patient had not yet returned home from an earlier appointment. Provided contact information with request for return call.

## 2023-07-02 ENCOUNTER — Encounter: Payer: Self-pay | Admitting: Obstetrics and Gynecology

## 2023-07-02 ENCOUNTER — Ambulatory Visit: Payer: Medicare Other | Admitting: Obstetrics and Gynecology

## 2023-07-02 VITALS — BP 136/79 | HR 96

## 2023-07-02 DIAGNOSIS — N3281 Overactive bladder: Secondary | ICD-10-CM | POA: Diagnosis not present

## 2023-07-02 NOTE — Progress Notes (Signed)
 [facility] Return Visit  SUBJECTIVE  History of Present Illness: Rachel Carlson is a 78 y.o. female seen in follow-up for overactive bladder.   Does not feel she has as good of a result as before. Has stronger urgency, but is able to hold it and does not have leakage (whereas she was leaking a lot before the botox ). Not urinating frequently. Not straining to empty her bladder. No burning with urination She feels like at 6 months, her symptoms had returned to baseline.  She is on a maintenance treatment for her cancer and is planning colostomy revision.   Past Medical History: Patient  has a past medical history of Blood transfusion without reported diagnosis, Chronic right ear pain, Diverticulosis, DVT of leg (deep venous thrombosis) (HCC), Esophageal stricture, Family history of breast cancer, Family history of colon cancer, Family history of melanoma, Family history of prostate cancer, Fibroids (1998), GERD (gastroesophageal reflux disease), History of hiatal hernia, History of uterine prolapse, adenomatous polyp of colon (10/19/2014), Hyperlipidemia, Hypertension, Nausea and vomiting (05/19/2023), Neuromuscular disorder (HCC), Obesity, Ovarian cancer (HCC), Peritoneal carcinomatosis (HCC), PVC (premature ventricular contraction), Squamous cell carcinoma of arm, Tubal pregnancy, Ulcerative proctosigmoiditis (HCC), and Urge urinary incontinence (06/03/2007).   Past Surgical History: She  has a past surgical history that includes Ectopic pregnancy surgery (1980); Partial hysterectomy; Bladder neck suspension (1998); Esophagogastroduodenoscopy (egd) with esophageal dilation (04/03/2012); Upper gastrointestinal endoscopy; Breast biopsy (Left); Colonoscopy (05/18/2011/10/12/14); Cataract extraction (Right, 01/2020); Cataract extraction w/PHACO (Left, 08/12/2020); Skin cancer excision; Breast lumpectomy (Left); IR IMAGING GUIDED PORT INSERTION (09/06/2022); Salpingoophorectomy (Bilateral,  12/12/2022); Omentectomy (N/A, 12/12/2022); Debulking (N/A, 12/12/2022); Bilateral salpingectomy (Bilateral, 12/12/2022); and Incision and drainage abscess (N/A, 01/02/2023).   Medications: She has a current medication list which includes the following prescription(s): eliquis , vitamin c , calcium  citrate-vitamin d , vitamin d -3, co q-10, cranberry, glucosamine-chondroitin, bioflavonoid products, ketoconazole , lidocaine -prilocaine , meclizine , milk thistle, niraparib  tosylate, omega-3 fatty acids, ondansetron , polyethyl glycol-propyl glycol, prevacid  24hr, prochlorperazine , rosuvastatin , temazepam , triamcinolone  cream, cyanocobalamin , and zinc gluconate.   Allergies: Patient is allergic to aspartame, levaquin  [levofloxacin ], pantoprazole  sodium, ranitidine, omeprazole , and sulfasalazine .   Social History: Patient  reports that she has never smoked. She has never used smokeless tobacco. She reports that she does not drink alcohol  and does not use drugs.     OBJECTIVE     Physical Exam: Vitals:   07/02/23 1119  BP: 136/79  Pulse: 96   Gen: No apparent distress, A&O x 3.  Detailed Urogynecologic Evaluation:  Deferred.    ASSESSMENT AND PLAN    Rachel Carlson is a 78 y.o. with:  1. Overactive bladder     - Will plan for 5 month intervals of the intravesical botox  150units. Next injection will be scheduled October.   She will call with any further questions or concerns.    Arma Lamp, MD  Time spent: I spent 16 minutes dedicated to the care of this patient on the date of this encounter to include pre-visit review of records, face-to-face time with the patient and post visit documentation.

## 2023-07-04 ENCOUNTER — Other Ambulatory Visit: Payer: Self-pay | Admitting: Pharmacy Technician

## 2023-07-04 ENCOUNTER — Other Ambulatory Visit: Payer: Self-pay

## 2023-07-04 NOTE — Progress Notes (Signed)
 Specialty Pharmacy Refill Coordination Note  Rachel Carlson is a 77 y.o. female contacted today regarding refills of specialty medication(s) Niraparib  Tosylate (ZEJULA )   Patient requested Delivery   Delivery date: 07/10/23   Verified address: [address] [place], [place] [address]   Medication will be filled on 07/09/23.

## 2023-07-05 ENCOUNTER — Telehealth: Payer: Self-pay | Admitting: Family Medicine

## 2023-07-05 DIAGNOSIS — R21 Rash and other nonspecific skin eruption: Secondary | ICD-10-CM

## 2023-07-05 NOTE — Telephone Encounter (Signed)
 Copied from CRM [id number]. Topic: Clinical - Medication Question >> Jul 05, 2023  3:09 PM Rachel Carlson wrote: Reason for CRM: patient stated that medication triamcinolone  cream (KENALOG ) 0.5 % is not working for red areas that she came in for on 05/22 and she stating that she needs a call back to go over a solution or new medication

## 2023-07-05 NOTE — Addendum Note (Signed)
 Addended by: Herby Lolling E on: 07/05/2023 05:09 PM   Modules accepted: Orders

## 2023-07-05 NOTE — Telephone Encounter (Signed)
 Spoke with Rachel Carlson.  She states that the areas are now a lighter pink but that has been the only change.  No new symptoms.  She states she spoke with her oncologist and they did not feel it was from the chemotherapy.  She would be agreeable to dermatology referral.  [facility] Location.

## 2023-07-05 NOTE — Telephone Encounter (Signed)
 Will route to Dr. Cherlyn Cornet who eval pt

## 2023-07-09 ENCOUNTER — Other Ambulatory Visit: Payer: Self-pay

## 2023-07-09 ENCOUNTER — Other Ambulatory Visit: Payer: Self-pay | Admitting: Family Medicine

## 2023-07-09 DIAGNOSIS — Z1231 Encounter for screening mammogram for malignant neoplasm of breast: Secondary | ICD-10-CM

## 2023-07-16 ENCOUNTER — Telehealth: Payer: Self-pay | Admitting: *Deleted

## 2023-07-16 NOTE — Telephone Encounter (Signed)
 Per Dr. Malissa Se pt needs a surgical clearance appt.  We received a surgical clearance form from [facility].  Please schedule, thanks

## 2023-07-23 ENCOUNTER — Ambulatory Visit (INDEPENDENT_AMBULATORY_CARE_PROVIDER_SITE_OTHER): Admitting: Family Medicine

## 2023-07-23 ENCOUNTER — Encounter: Payer: Self-pay | Admitting: Family Medicine

## 2023-07-23 ENCOUNTER — Telehealth: Payer: Self-pay | Admitting: *Deleted

## 2023-07-23 ENCOUNTER — Ambulatory Visit: Payer: Self-pay | Admitting: Family Medicine

## 2023-07-23 VITALS — BP 108/62 | HR 93 | Temp 98.0°F | Ht 62.0 in | Wt 160.1 lb

## 2023-07-23 DIAGNOSIS — K5130 Ulcerative (chronic) rectosigmoiditis without complications: Secondary | ICD-10-CM

## 2023-07-23 DIAGNOSIS — I10 Essential (primary) hypertension: Secondary | ICD-10-CM

## 2023-07-23 DIAGNOSIS — Z01818 Encounter for other preprocedural examination: Secondary | ICD-10-CM | POA: Diagnosis not present

## 2023-07-23 DIAGNOSIS — R7303 Prediabetes: Secondary | ICD-10-CM

## 2023-07-23 DIAGNOSIS — K9400 Colostomy complication, unspecified: Secondary | ICD-10-CM

## 2023-07-23 DIAGNOSIS — Z86718 Personal history of other venous thrombosis and embolism: Secondary | ICD-10-CM

## 2023-07-23 DIAGNOSIS — E2839 Other primary ovarian failure: Secondary | ICD-10-CM

## 2023-07-23 DIAGNOSIS — M8580 Other specified disorders of bone density and structure, unspecified site: Secondary | ICD-10-CM

## 2023-07-23 LAB — CBC WITH DIFFERENTIAL/PLATELET
Basophils Absolute: 0 10*3/uL (ref 0.0–0.1)
Basophils Relative: 0.5 % (ref 0.0–3.0)
Eosinophils Absolute: 0.1 10*3/uL (ref 0.0–0.7)
Eosinophils Relative: 1.8 % (ref 0.0–5.0)
HCT: 37.6 % (ref 36.0–46.0)
Hemoglobin: 12.7 g/dL (ref 12.0–15.0)
Lymphocytes Relative: 18.6 % (ref 12.0–46.0)
Lymphs Abs: 0.9 10*3/uL (ref 0.7–4.0)
MCHC: 33.9 g/dL (ref 30.0–36.0)
MCV: 93.3 fl (ref 78.0–100.0)
Monocytes Absolute: 0.7 10*3/uL (ref 0.1–1.0)
Monocytes Relative: 15.7 % — ABNORMAL HIGH (ref 3.0–12.0)
Neutro Abs: 2.9 10*3/uL (ref 1.4–7.7)
Neutrophils Relative %: 63.4 % (ref 43.0–77.0)
Platelets: 212 10*3/uL (ref 150.0–400.0)
RBC: 4.03 Mil/uL (ref 3.87–5.11)
RDW: 17 % — ABNORMAL HIGH (ref 11.5–15.5)
WBC: 4.6 10*3/uL (ref 4.0–10.5)

## 2023-07-23 LAB — BASIC METABOLIC PANEL WITH GFR
BUN: 15 mg/dL (ref 6–23)
CO2: 28 meq/L (ref 19–32)
Calcium: 9.8 mg/dL (ref 8.4–10.5)
Chloride: 100 meq/L (ref 96–112)
Creatinine, Ser: 0.76 mg/dL (ref 0.40–1.20)
GFR: 76.56 mL/min (ref >60.00)
Glucose, Bld: 108 mg/dL — ABNORMAL HIGH (ref 70–99)
Potassium: 4.2 meq/L (ref 3.5–5.1)
Sodium: 135 meq/L (ref 135–145)

## 2023-07-23 LAB — HEMOGLOBIN A1C: Hgb A1c MFr Bld: 6.3 % (ref 4.6–6.5)

## 2023-07-23 MED ORDER — APIXABAN 5 MG PO TABS: 5.0000 mg | ORAL_TABLET | Freq: Two times a day (BID) | ORAL | 2 refills | Status: DC

## 2023-07-23 NOTE — Patient Instructions (Addendum)
 Lab today for cbc / bmet and A1c  The A1c may be higher than expected due to the steroids you were on   Take care of yourself  Try and stay hydrated /best you can   You can hold your eliquis  for surgery (as long as the surgeon wants- I expect 48 to 72 hours)   I will send form to surgeon   They will need to reach out to cardiology also (if not already)

## 2023-07-23 NOTE — Assessment & Plan Note (Signed)
 bp in fair control at this time  BP Readings from Last 1 Encounters:  07/23/23 108/62   No medication needed  Most recent labs reviewed  Disc lifstyle change with low sodium diet and exercise

## 2023-07-23 NOTE — Telephone Encounter (Signed)
 See prev note also. Pt also came back and asked if PCP thinks she needs a DEXA, it's been 2 yrs since her last one and if PCP wants her to get one she just needs to be an order in for it.  I did look and last DEXA was 10/31/21 so not quite a year. Also [facility]. Pt does have a CPE on 08/21/23

## 2023-07-23 NOTE — Telephone Encounter (Signed)
 She will be due in October (2 years=last was 10/2021) for osteopenia  The breast center no longer does these so I ordered it at [facility]   Please give her the # to call to schedule in oct

## 2023-07-23 NOTE — Progress Notes (Signed)
 Subjective:    Patient ID: Rachel Carlson, female    DOB: 14-Jul-1945, 78 y.o.   MRN: [id number]  HPI  Wt Readings from Last 3 Encounters:  07/23/23 160 lb 2 oz (72.6 kg)  06/26/23 160 lb 0.9 oz (72.6 kg)  06/20/23 163 lb (73.9 kg)   29.29 kg/m  Vitals:   07/23/23 1127  BP: 108/62  Pulse: 93  Temp: 98 F (36.7 C)  SpO2: 95%   Pt presents to get medical clearance for upcoming colostomy revision surgery (for colostomy prolapse) , with possible parastomal hernia repair  Sees Dr Lonni Pizza and [facility]  Reduces it at night Tries to sleep on back which is hard    Has been treated for primary peritoneal carcinomatosis On niraparib  mt therapy currently after 8 cycles of chemo   Also history of UC   Surgery 11/2022 [facility] for resection of peritoneal dz, small bowel resection , right salpingo -oopphectomy and colostomy Noted to have malignant ascities   In and out of hospitals for partia bowel obst and pleural effusions (req thoracentesis)    Takes eliquis  for history of DVT (calf 07/24/22)   Does have some intermittent nausea from cancer treatment  Takes compazine  prn / or zofran   No pain  Does not feel like she has a bowel obstruction   Trying not to bend or squat   Tries to keep up with fluids when not nausea   Also gets vertigo in car at times- takes nausea  No virus or cold before heand   Gets botox  inj for overactive bladder Has to hold eliquis  for that also   Saw cardiology in march  For PVCs No chest pain or shortness of breath  These still occur and do not bother her at all  Normal treadmill stress 2018 , normal echo 2023    Had CT coronary calcium  scan 73rd percentile with score of 170 Major vessel was clear   Appetite is up and down   Soft foods Avoid high fiber and seeds  Tries to get protein  Lot of ensure    Lab Results  Component Value Date   NA 133 (L) 06/26/2023   K 3.9 06/26/2023   CO2 21 (L) 06/26/2023   GLUCOSE  100 (H) 06/26/2023   BUN 20 06/26/2023   CREATININE 0.59 06/26/2023   CALCIUM  9.6 06/26/2023   GFR 85.66 08/13/2022   GFRNONAA >60 06/26/2023   Lab Results  Component Value Date   WBC 4.4 06/26/2023   HGB 11.5 (L) 06/26/2023   HCT 33.8 (L) 06/26/2023   MCV 96.3 06/26/2023   PLT 206 06/26/2023   Lab Results  Component Value Date   ALT 15 06/26/2023   AST 20 06/26/2023   ALKPHOS 78 06/26/2023   BILITOT 0.7 06/26/2023    Lab Results  Component Value Date   INR 1.1 (H) 05/15/2022     Has appointment scheduled with cardiology on 6/24  Oncology on 6/27   HTN bp is stable today  No cp or palpitations or headaches or edema  No side effects to medicines  BP Readings from Last 3 Encounters:  07/23/23 108/62  07/02/23 136/79  06/26/23 121/80    No medicines currently   Prediabetes Lab Results  Component Value Date   HGBA1C 6.3 08/13/2022   HGBA1C 6.3 08/11/2021   HGBA1C 6.4 08/11/2020     Lab Results  Component Value Date   NA 133 (L) 06/26/2023   K 3.9 06/26/2023  CO2 21 (L) 06/26/2023   GLUCOSE 100 (H) 06/26/2023   BUN 20 06/26/2023   CREATININE 0.59 06/26/2023   CALCIUM  9.6 06/26/2023   GFR 85.66 08/13/2022   GFRNONAA >60 06/26/2023   Took dexamethasone  until 3 weeks ago for colitis  Was on it 2 weeks    Patient Active Problem List   Diagnosis Date Noted   Pre-operative clearance 07/23/2023   Nausea and vomiting 05/19/2023   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   Dysuria 05/19/2023   Partial small bowel obstruction (HCC) 05/16/2023   Hypokalemia 05/16/2023   Bilateral inguinal hernia without obstruction or gangrene 05/08/2023   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   Colostomy complication (HCC) 04/03/2023   History of colonic polyps 04/03/2023   Colostomy care (HCC) 01/24/2023   Nonhealing nonsurgical wound limited to breakdown of skin 01/24/2023   Irritant contact dermatitis associated with fecal stoma 01/24/2023   Wound  infection after surgery 01/02/2023   Postoperative infection 01/01/2023   Erythema of wound 12/26/2022   Gynecologic malignancy (HCC) 12/12/2022   Iron deficiency anemia 10/25/2022   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   Elevated tumor markers 08/31/2022   History of DVT (deep vein thrombosis) 08/01/2022   Ascites 07/27/2022   Left leg swelling 07/23/2022   Swelling abdomen 07/23/2022   History of vaginal bleeding 08/17/2021   Nonrheumatic mitral valve regurgitation 04/16/2019   Rash 08/07/2018   Essential hypertension 04/11/2018   PVC's (premature ventricular contractions) 04/11/2018   Class 1 obesity due to excess calories with serious comorbidity and body mass index (BMI) of 34.0 to 34.9 in adult 12/06/2017   Genetic testing 09/26/2017   Family history of breast cancer    Family history of colon cancer    Family history of prostate cancer    Family history of melanoma    Family history of Lynch syndrome - son 08/04/2017   Prediabetes 07/20/2015   Estrogen deficiency 04/27/2015   GERD (gastroesophageal reflux disease) 03/23/2015   Hx of adenomatous polyp of colon 10/19/2014   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   Pain in joint, pelvic region and thigh 07/24/2014   Need for hepatitis C screening test 07/12/2014   Pelvic pain in female 07/12/2014   Vitamin D  deficiency 07/03/2014   Encounter for Medicare annual wellness exam 07/08/2013   Osteoporosis 05/24/2013   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   Detrusor muscle hypertonia 11/15/2010   Adnexal pain 11/15/2010   Urge incontinence 11/15/2010   Family history of coronary artery disease 10/15/2010   Routine general medical examination at a health care facility 06/01/2010   FASCIITIS, PLANTAR 06/06/2009   Hyperlipidemia 06/03/2007   Urge urinary incontinence 06/03/2007   MIGRAINES, HX OF 06/03/2007   Past Medical History:  Diagnosis Date   Blood transfusion without reported  diagnosis    Chronic right ear pain    normal MRI   Diverticulosis    DVT of leg (deep venous thrombosis) (HCC)    Esophageal stricture    Family history of breast cancer    Family history of colon cancer    Family history of melanoma    Family history of prostate cancer    Fibroids 1998   uterine, history of (left ovaries)   GERD (gastroesophageal reflux disease)    History of hiatal hernia    History of uterine prolapse    Hx of adenomatous polyp of colon 10/19/2014   Hyperlipidemia    Hypertension    borderline no meds diet and  exercise   Nausea and vomiting 05/19/2023   Neuromuscular disorder (HCC)    neuropathy from chemo   Obesity    Ovarian cancer (HCC)    Peritoneal carcinomatosis (HCC)    PVC (premature ventricular contraction)    first dx by holter in 1980's, echo (6/10) EF 55-60%, normal diastolic fxn, normal size RV and fxn, mild MR, PASP   Squamous cell carcinoma of arm    ovarian   Tubal pregnancy    Ulcerative proctosigmoiditis (HCC)    Urge urinary incontinence 06/03/2007   Qualifier: Diagnosis of  By: Cecilie CMA, NANNIE Ivy     Past Surgical History:  Procedure Laterality Date   BILATERAL SALPINGECTOMY Bilateral 12/12/2022   Procedure: OPEN BILATERAL SALPINGECTOMY-OOPHORECTOMY;  Surgeon: Viktoria Comer SAUNDERS, MD;  Location: [facility];  Service: Gynecology;  Laterality: Bilateral;   BLADDER NECK SUSPENSION  1998   BREAST BIOPSY Left    BREAST LUMPECTOMY Left    benign   CATARACT EXTRACTION Right 01/2020   CATARACT EXTRACTION W/PHACO Left 08/12/2020   Procedure: CATARACT EXTRACTION PHACO AND INTRAOCULAR LENS PLACEMENT LEFT EYE;  Surgeon: Harrie Agent, MD;  Location: [facility];  Service: Ophthalmology;  Laterality: Left;  left CDE=10.71   COLONOSCOPY  05/18/2011/10/12/14   Dr. Lupita Commander   DEBULKING N/A 12/12/2022   Procedure: RADICAL TUMOR DEBULKING, PERITONEAL STRIPPING, MOBILIZATION SPLENIC FLEXURE, RECTOSIGMOID RESECTION,SMALL BOWEL RESECTION  AND REANASTOMOSIS ,END TO END DESCENDING COLOSTOMY;  Surgeon: Viktoria Comer SAUNDERS, MD;  Location: [facility];  Service: Gynecology;  Laterality: N/A;   ECTOPIC PREGNANCY SURGERY  1980   ESOPHAGOGASTRODUODENOSCOPY (EGD) WITH ESOPHAGEAL DILATION  04/03/2012   Dr. Lupita Commander   INCISION AND DRAINAGE ABSCESS N/A 01/02/2023   Procedure: INCISION AND DRAINAGE OF ABDOMINAL WOUND SEROMA;  Surgeon: Rogelio Planas, MD;  Location: [facility];  Service: Gynecology;  Laterality: N/A;   IR IMAGING GUIDED PORT INSERTION  09/06/2022   OMENTECTOMY N/A 12/12/2022   Procedure: OMENTECTOMY;  Surgeon: Viktoria Comer SAUNDERS, MD;  Location: [facility];  Service: Gynecology;  Laterality: N/A;   PARTIAL HYSTERECTOMY     abdominal, fibroids, and prolapse (1998) bladder tack   SALPINGOOPHORECTOMY Bilateral 12/12/2022   Procedure: DIAGNOSTIC LAPAROSCOPY;  Surgeon: Viktoria Comer SAUNDERS, MD;  Location: [facility];  Service: Gynecology;  Laterality: Bilateral;   SKIN CANCER EXCISION     UPPER GASTROINTESTINAL ENDOSCOPY     Social History   Tobacco Use   Smoking status: Never   Smokeless tobacco: Never  Vaping Use   Vaping status: Never Used  Substance Use Topics   Alcohol  use: No    Alcohol /week: 0.0 standard drinks of alcohol     Comment: rarely beer   Drug use: No   Family History  Problem Relation Age of Onset   Heart attack Mother 74   Transient ischemic attack Mother    Diabetes type II Mother    Sudden death Mother        died age 22   Diabetes Mother    Coronary artery disease Mother    Skin cancer Mother    Barrett's esophagus Mother    Sudden death Father 81       coronary arteriosclerosis on death certificate   Breast cancer Sister        dx in her 61s   Colon polyps Sister        adenomatous   Arrhythmia Brother    Colon cancer Brother 14   Congestive Heart Failure Maternal Grandmother    Prostate cancer Maternal  Grandfather        dx in 58s   Tuberculosis Paternal Grandfather    Ehlers-Danlos  syndrome Daughter    Colon cancer Son 65       lynch syndrome   Other Son        parotid gland tumor   Skin cancer Maternal Aunt    Barrett's esophagus Maternal Aunt    Melanoma Maternal Uncle    Colon cancer Cousin        mid 56s; maternal cousin   Colon cancer Other        MGMs brother   Esophageal cancer Neg Hx    Stomach cancer Neg Hx    Rectal cancer Neg Hx    Uterine cancer Neg Hx    Bladder Cancer Neg Hx    Allergies  Allergen Reactions   Aspartame     Intolerance to artifical sweeteners, pt states caused burning of bladder   Levaquin  [Levofloxacin ] Nausea Only   Pantoprazole  Sodium Diarrhea    Abdominal pain   Ranitidine Diarrhea    abdominal pain   Omeprazole  Diarrhea    Abdominal pain  Other Reaction(s): Not available   Sulfasalazine  Rash and Other (See Comments)    Fever, chills, headache, muscle pain also   Current Outpatient Medications on File Prior to Visit  Medication Sig Dispense Refill   Ascorbic Acid  (VITAMIN C ) 1000 MG tablet Take 1,000 mg by mouth daily.     calcium  citrate-vitamin D  (CITRACAL+D) 315-200 MG-UNIT per tablet Take 1 tablet by mouth 2 (two) times daily.     Cholecalciferol (VITAMIN D -3) 5000 UNITS TABS Take 5,000 Units by mouth daily.     Coenzyme Q10 (CO Q-10) 200 MG CAPS Take 200 mg by mouth daily.     CRANBERRY PO Take 650 mg by mouth daily.     Glucosamine-Chondroitin (GLUCOSAMINE CHONDR COMPLEX PO) Take 2 capsules by mouth daily.     GRAPE SEED EXTRACT PO Take 1 capsule by mouth daily.     ketoconazole  (NIZORAL ) 2 % cream Apply 1 Application topically daily as needed for irritation.     lidocaine -prilocaine  (EMLA ) cream Apply 1 Application topically as needed.     meclizine  (ANTIVERT ) 25 MG tablet TAKE 1 TABLET BY MOUTH THREE TIMES DAILY AS NEEDED FOR NAUSEA OR DIZZINESS 20 tablet 0   milk thistle 175 MG tablet Take 175 mg by mouth daily.     niraparib  tosylate (ZEJULA ) 200 MG tablet Take 1 tablet (200 mg total) by mouth daily. May  take at bedtime to reduce nausea and vomiting. 30 tablet 1   Omega-3 Fatty Acids (FISH OIL TRIPLE STRENGTH PO) Take 1 capsule by mouth daily.     ondansetron  (ZOFRAN ) 8 MG tablet Take 8 mg by mouth every 8 (eight) hours as needed for nausea or vomiting.     Polyethyl Glycol-Propyl Glycol (SYSTANE OP) Place 1 drop into both eyes 4 (four) times daily as needed (for dryness).     PREVACID  24HR 15 MG capsule Take 15 mg by mouth See admin instructions. Take 15 mg by mouth in the morning before any food and an additional 15 mg once a day as needed for unresolved reflux     prochlorperazine  (COMPAZINE ) 10 MG tablet Take 10 mg by mouth every 6 (six) hours as needed for nausea or vomiting.     rosuvastatin  (CRESTOR ) 20 MG tablet Take 1 tablet (20 mg total) by mouth at bedtime. 90 tablet 3   temazepam  (RESTORIL ) 30 MG capsule  Take 1 capsule (30 mg total) by mouth at bedtime as needed for sleep. 30 capsule 0   triamcinolone  cream (KENALOG ) 0.5 % Apply 1 Application topically 2 (two) times daily. 30 g 0   vitamin B-12 (CYANOCOBALAMIN ) 1000 MCG tablet Take 1,000 mcg by mouth daily.     zinc gluconate 50 MG tablet Take 50 mg by mouth daily.     No current facility-administered medications on file prior to visit.    Review of Systems  Constitutional:  Positive for fatigue. Negative for activity change, appetite change, fever and unexpected weight change.  HENT:  Negative for congestion, ear pain, rhinorrhea, sinus pressure and sore throat.   Eyes:  Negative for pain, redness and visual disturbance.  Respiratory:  Negative for cough, shortness of breath and wheezing.   Cardiovascular:  Negative for chest pain and palpitations.  Gastrointestinal:  Positive for nausea. Negative for abdominal pain, blood in stool, constipation, diarrhea and vomiting.       Some loose stools from ostomy  Prolapse of ostomy No pain   Occational nausea from medication   Endocrine: Negative for polydipsia and polyuria.   Genitourinary:  Negative for dysuria, frequency and urgency.  Musculoskeletal:  Negative for arthralgias, back pain and myalgias.       Frustrated she is not released to exercise yet   Skin:  Negative for pallor and rash.  Allergic/Immunologic: Negative for environmental allergies.  Neurological:  Negative for dizziness, syncope and headaches.  Hematological:  Negative for adenopathy. Does not bruise/bleed easily.  Psychiatric/Behavioral:  Negative for decreased concentration and dysphoric mood. The patient is not nervous/anxious.        Objective:   Physical Exam Constitutional:      General: She is not in acute distress.    Appearance: Normal appearance. She is well-developed and normal weight. She is not ill-appearing or diaphoretic.  HENT:     Head: Normocephalic and atraumatic.     Mouth/Throat:     Mouth: Mucous membranes are moist.   Eyes:     General: No scleral icterus.       Right eye: No discharge.        Left eye: No discharge.     Conjunctiva/sclera: Conjunctivae normal.     Pupils: Pupils are equal, round, and reactive to light.   Neck:     Thyroid : No thyromegaly.     Vascular: No carotid bruit or JVD.   Cardiovascular:     Rate and Rhythm: Regular rhythm. Tachycardia present.     Heart sounds: Normal heart sounds.     No gallop.     Comments: Rate in low 90s  Pulmonary:     Effort: Pulmonary effort is normal. No respiratory distress.     Breath sounds: Normal breath sounds. No wheezing or rales.  Abdominal:     General: Bowel sounds are normal. There is no distension or abdominal bruit.     Palpations: Abdomen is soft. There is no mass or pulsatile mass.     Tenderness: There is no abdominal tenderness. There is no guarding or rebound.     Comments: Colostomy noted with prolapse into bag   Musculoskeletal:     Cervical back: Normal range of motion and neck supple.     Right lower leg: No edema.     Left lower leg: No edema.  Lymphadenopathy:      Cervical: No cervical adenopathy.   Skin:    General: Skin is warm and dry.  Coloration: Skin is not pale.     Findings: No rash.   Neurological:     Mental Status: She is alert.     Cranial Nerves: No cranial nerve deficit.     Coordination: Coordination normal.     Deep Tendon Reflexes: Reflexes are normal and symmetric. Reflexes normal.   Psychiatric:        Mood and Affect: Mood normal.           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)   Recent course of steroids Improved With colostomy        Other   Prediabetes   A1c today  This may be up due to recent steroids for colitis       Relevant Orders   Hemoglobin A1c   Pre-operative clearance - Primary   Pt is prepping for colostomy revision for prolapse with possible parastromal hernia repair with Dr Teresa at [facility]   Overall stable with her peritoneal carcinomatosis on niraparb  Continues some intermittent nausea  Has history of UC also   History of PVCs and some coronary calcification - will need surg clearance from cardiology as well (reviewed note from 03/2023 and Ct ca score with pt today  Reassuring exam  Takes eliquis  for DVT related to her cancer status  Is ok to hold this for 48-72 hours pre surgery (per surgeon)   Did well with her more extensive surgery in November   Oncology appointment upcoming as well   HTN is controlled w/o medication Bmet and cbc today           History of DVT (deep vein thrombosis)   In setting of peritoneal cancer Continues eliquis  5 mg bid   Will hold for upcoming surgery 48-72 hours per surgeon's preference        Colostomy complication (HCC)   Prolapse Planning surgery

## 2023-07-23 NOTE — Assessment & Plan Note (Signed)
 Pt is prepping for colostomy revision for prolapse with possible parastromal hernia repair with Dr Teresa at [facility]   Overall stable with her peritoneal carcinomatosis on niraparb  Continues some intermittent nausea  Has history of UC also   History of PVCs and some coronary calcification - will need surg clearance from cardiology as well (reviewed note from 03/2023 and Ct ca score with pt today  Reassuring exam  Takes eliquis  for DVT related to her cancer status  Is ok to hold this for 48-72 hours pre surgery (per surgeon)   Did well with her more extensive surgery in November   Oncology appointment upcoming as well   HTN is controlled w/o medication Bmet and cbc today

## 2023-07-23 NOTE — Assessment & Plan Note (Signed)
 Prolapse Planning surgery

## 2023-07-23 NOTE — Assessment & Plan Note (Signed)
 Recent course of steroids Improved With colostomy

## 2023-07-23 NOTE — Assessment & Plan Note (Signed)
 In setting of peritoneal cancer Continues eliquis  5 mg bid   Will hold for upcoming surgery 48-72 hours per surgeon's preference

## 2023-07-23 NOTE — Assessment & Plan Note (Signed)
 A1c today  This may be up due to recent steroids for colitis

## 2023-07-23 NOTE — Telephone Encounter (Signed)
 Pt came back to see me after her appt with PCP. She said PCP wants her to stay on eliquis  and she needs a new Rx sent to the [facility] in [place].

## 2023-07-24 NOTE — Telephone Encounter (Signed)
 Pt notified of Dr. Graham comments. # provided to [employer]

## 2023-07-26 ENCOUNTER — Inpatient Hospital Stay

## 2023-07-26 ENCOUNTER — Emergency Department (HOSPITAL_COMMUNITY)

## 2023-07-26 ENCOUNTER — Ambulatory Visit (HOSPITAL_COMMUNITY)

## 2023-07-26 ENCOUNTER — Emergency Department (HOSPITAL_COMMUNITY): Admitting: Anesthesiology

## 2023-07-26 ENCOUNTER — Inpatient Hospital Stay (HOSPITAL_COMMUNITY)

## 2023-07-26 ENCOUNTER — Encounter (HOSPITAL_COMMUNITY): Admission: EM | Disposition: E | Payer: Self-pay | Source: Home / Self Care | Attending: Neurology

## 2023-07-26 ENCOUNTER — Other Ambulatory Visit

## 2023-07-26 ENCOUNTER — Inpatient Hospital Stay (HOSPITAL_COMMUNITY)
Admission: EM | Admit: 2023-07-26 | Discharge: 2023-08-30 | DRG: 023 | Disposition: E | Attending: Neurology | Admitting: Neurology

## 2023-07-26 DIAGNOSIS — I6523 Occlusion and stenosis of bilateral carotid arteries: Secondary | ICD-10-CM | POA: Diagnosis not present

## 2023-07-26 DIAGNOSIS — I6601 Occlusion and stenosis of right middle cerebral artery: Secondary | ICD-10-CM

## 2023-07-26 DIAGNOSIS — R509 Fever, unspecified: Secondary | ICD-10-CM | POA: Insufficient documentation

## 2023-07-26 DIAGNOSIS — Z8543 Personal history of malignant neoplasm of ovary: Secondary | ICD-10-CM

## 2023-07-26 DIAGNOSIS — C800 Disseminated malignant neoplasm, unspecified: Secondary | ICD-10-CM | POA: Diagnosis present

## 2023-07-26 DIAGNOSIS — Z48813 Encounter for surgical aftercare following surgery on the respiratory system: Secondary | ICD-10-CM | POA: Diagnosis not present

## 2023-07-26 DIAGNOSIS — G936 Cerebral edema: Secondary | ICD-10-CM | POA: Diagnosis not present

## 2023-07-26 DIAGNOSIS — Z7901 Long term (current) use of anticoagulants: Secondary | ICD-10-CM | POA: Diagnosis not present

## 2023-07-26 DIAGNOSIS — R4781 Slurred speech: Secondary | ICD-10-CM | POA: Diagnosis present

## 2023-07-26 DIAGNOSIS — C786 Secondary malignant neoplasm of retroperitoneum and peritoneum: Secondary | ICD-10-CM | POA: Diagnosis present

## 2023-07-26 DIAGNOSIS — J910 Malignant pleural effusion: Secondary | ICD-10-CM | POA: Diagnosis present

## 2023-07-26 DIAGNOSIS — G9389 Other specified disorders of brain: Secondary | ICD-10-CM | POA: Diagnosis not present

## 2023-07-26 DIAGNOSIS — I639 Cerebral infarction, unspecified: Secondary | ICD-10-CM

## 2023-07-26 DIAGNOSIS — J9601 Acute respiratory failure with hypoxia: Secondary | ICD-10-CM | POA: Diagnosis not present

## 2023-07-26 DIAGNOSIS — I6389 Other cerebral infarction: Secondary | ICD-10-CM | POA: Diagnosis not present

## 2023-07-26 DIAGNOSIS — R531 Weakness: Secondary | ICD-10-CM | POA: Diagnosis not present

## 2023-07-26 DIAGNOSIS — K9409 Other complications of colostomy: Secondary | ICD-10-CM | POA: Diagnosis not present

## 2023-07-26 DIAGNOSIS — D6859 Other primary thrombophilia: Secondary | ICD-10-CM | POA: Diagnosis not present

## 2023-07-26 DIAGNOSIS — J9691 Respiratory failure, unspecified with hypoxia: Secondary | ICD-10-CM | POA: Diagnosis not present

## 2023-07-26 DIAGNOSIS — J690 Pneumonitis due to inhalation of food and vomit: Secondary | ICD-10-CM | POA: Diagnosis not present

## 2023-07-26 DIAGNOSIS — J9690 Respiratory failure, unspecified, unspecified whether with hypoxia or hypercapnia: Secondary | ICD-10-CM | POA: Diagnosis not present

## 2023-07-26 DIAGNOSIS — I63431 Cerebral infarction due to embolism of right posterior cerebral artery: Secondary | ICD-10-CM | POA: Diagnosis not present

## 2023-07-26 DIAGNOSIS — I63411 Cerebral infarction due to embolism of right middle cerebral artery: Secondary | ICD-10-CM | POA: Diagnosis not present

## 2023-07-26 DIAGNOSIS — Z86718 Personal history of other venous thrombosis and embolism: Secondary | ICD-10-CM | POA: Diagnosis not present

## 2023-07-26 DIAGNOSIS — I69391 Dysphagia following cerebral infarction: Secondary | ICD-10-CM | POA: Diagnosis not present

## 2023-07-26 DIAGNOSIS — Z66 Do not resuscitate: Secondary | ICD-10-CM | POA: Diagnosis not present

## 2023-07-26 DIAGNOSIS — R29810 Facial weakness: Secondary | ICD-10-CM | POA: Diagnosis not present

## 2023-07-26 DIAGNOSIS — T451X5A Adverse effect of antineoplastic and immunosuppressive drugs, initial encounter: Secondary | ICD-10-CM | POA: Diagnosis present

## 2023-07-26 DIAGNOSIS — G935 Compression of brain: Secondary | ICD-10-CM | POA: Diagnosis not present

## 2023-07-26 DIAGNOSIS — I63541 Cerebral infarction due to unspecified occlusion or stenosis of right cerebellar artery: Secondary | ICD-10-CM | POA: Diagnosis not present

## 2023-07-26 DIAGNOSIS — D72819 Decreased white blood cell count, unspecified: Secondary | ICD-10-CM | POA: Diagnosis not present

## 2023-07-26 DIAGNOSIS — R0989 Other specified symptoms and signs involving the circulatory and respiratory systems: Secondary | ICD-10-CM | POA: Diagnosis not present

## 2023-07-26 DIAGNOSIS — R414 Neurologic neglect syndrome: Secondary | ICD-10-CM | POA: Diagnosis not present

## 2023-07-26 DIAGNOSIS — I63311 Cerebral infarction due to thrombosis of right middle cerebral artery: Secondary | ICD-10-CM | POA: Diagnosis not present

## 2023-07-26 DIAGNOSIS — Z90722 Acquired absence of ovaries, bilateral: Secondary | ICD-10-CM

## 2023-07-26 DIAGNOSIS — Y838 Other surgical procedures as the cause of abnormal reaction of the patient, or of later complication, without mention of misadventure at the time of the procedure: Secondary | ICD-10-CM | POA: Diagnosis present

## 2023-07-26 DIAGNOSIS — C569 Malignant neoplasm of unspecified ovary: Secondary | ICD-10-CM | POA: Diagnosis not present

## 2023-07-26 DIAGNOSIS — R1310 Dysphagia, unspecified: Secondary | ICD-10-CM | POA: Diagnosis not present

## 2023-07-26 DIAGNOSIS — I63511 Cerebral infarction due to unspecified occlusion or stenosis of right middle cerebral artery: Secondary | ICD-10-CM | POA: Diagnosis not present

## 2023-07-26 DIAGNOSIS — G460 Middle cerebral artery syndrome: Secondary | ICD-10-CM | POA: Diagnosis not present

## 2023-07-26 DIAGNOSIS — R930 Abnormal findings on diagnostic imaging of skull and head, not elsewhere classified: Secondary | ICD-10-CM | POA: Diagnosis not present

## 2023-07-26 DIAGNOSIS — E669 Obesity, unspecified: Secondary | ICD-10-CM | POA: Diagnosis present

## 2023-07-26 DIAGNOSIS — Z4682 Encounter for fitting and adjustment of non-vascular catheter: Secondary | ICD-10-CM | POA: Diagnosis not present

## 2023-07-26 DIAGNOSIS — Z6830 Body mass index (BMI) 30.0-30.9, adult: Secondary | ICD-10-CM

## 2023-07-26 DIAGNOSIS — I10 Essential (primary) hypertension: Secondary | ICD-10-CM | POA: Diagnosis present

## 2023-07-26 DIAGNOSIS — R29715 NIHSS score 15: Secondary | ICD-10-CM | POA: Diagnosis not present

## 2023-07-26 DIAGNOSIS — Z9221 Personal history of antineoplastic chemotherapy: Secondary | ICD-10-CM

## 2023-07-26 DIAGNOSIS — G8104 Flaccid hemiplegia affecting left nondominant side: Secondary | ICD-10-CM | POA: Diagnosis present

## 2023-07-26 DIAGNOSIS — C801 Malignant (primary) neoplasm, unspecified: Secondary | ICD-10-CM | POA: Diagnosis not present

## 2023-07-26 DIAGNOSIS — A419 Sepsis, unspecified organism: Secondary | ICD-10-CM | POA: Diagnosis not present

## 2023-07-26 DIAGNOSIS — E785 Hyperlipidemia, unspecified: Secondary | ICD-10-CM | POA: Diagnosis not present

## 2023-07-26 DIAGNOSIS — Z90711 Acquired absence of uterus with remaining cervical stump: Secondary | ICD-10-CM

## 2023-07-26 DIAGNOSIS — J90 Pleural effusion, not elsewhere classified: Secondary | ICD-10-CM | POA: Diagnosis not present

## 2023-07-26 DIAGNOSIS — Z515 Encounter for palliative care: Secondary | ICD-10-CM

## 2023-07-26 DIAGNOSIS — R29720 NIHSS score 20: Secondary | ICD-10-CM | POA: Diagnosis present

## 2023-07-26 DIAGNOSIS — D6869 Other thrombophilia: Secondary | ICD-10-CM | POA: Diagnosis present

## 2023-07-26 DIAGNOSIS — C55 Malignant neoplasm of uterus, part unspecified: Secondary | ICD-10-CM | POA: Diagnosis not present

## 2023-07-26 DIAGNOSIS — I672 Cerebral atherosclerosis: Secondary | ICD-10-CM | POA: Diagnosis not present

## 2023-07-26 DIAGNOSIS — R0902 Hypoxemia: Secondary | ICD-10-CM | POA: Diagnosis not present

## 2023-07-26 DIAGNOSIS — R918 Other nonspecific abnormal finding of lung field: Secondary | ICD-10-CM | POA: Diagnosis not present

## 2023-07-26 DIAGNOSIS — Z7189 Other specified counseling: Secondary | ICD-10-CM | POA: Diagnosis not present

## 2023-07-26 DIAGNOSIS — G620 Drug-induced polyneuropathy: Secondary | ICD-10-CM | POA: Diagnosis present

## 2023-07-26 DIAGNOSIS — Z9911 Dependence on respirator [ventilator] status: Secondary | ICD-10-CM | POA: Diagnosis not present

## 2023-07-26 DIAGNOSIS — C762 Malignant neoplasm of abdomen: Secondary | ICD-10-CM | POA: Diagnosis not present

## 2023-07-26 DIAGNOSIS — G459 Transient cerebral ischemic attack, unspecified: Secondary | ICD-10-CM | POA: Diagnosis not present

## 2023-07-26 DIAGNOSIS — Y848 Other medical procedures as the cause of abnormal reaction of the patient, or of later complication, without mention of misadventure at the time of the procedure: Secondary | ICD-10-CM | POA: Diagnosis not present

## 2023-07-26 DIAGNOSIS — Z9079 Acquired absence of other genital organ(s): Secondary | ICD-10-CM

## 2023-07-26 DIAGNOSIS — R7303 Prediabetes: Secondary | ICD-10-CM | POA: Diagnosis present

## 2023-07-26 DIAGNOSIS — Z978 Presence of other specified devices: Secondary | ICD-10-CM | POA: Diagnosis not present

## 2023-07-26 HISTORY — PX: IR CT HEAD LTD: IMG2386

## 2023-07-26 HISTORY — PX: RADIOLOGY WITH ANESTHESIA: SHX6223

## 2023-07-26 HISTORY — PX: IR US GUIDE VASC ACCESS RIGHT: IMG2390

## 2023-07-26 HISTORY — PX: IR PERCUTANEOUS ART THROMBECTOMY/INFUSION INTRACRANIAL INC DIAG ANGIO: IMG6087

## 2023-07-26 LAB — COMPREHENSIVE METABOLIC PANEL WITH GFR
ALT: 11 U/L (ref 0–44)
AST: 16 U/L (ref 15–41)
Albumin: 3.1 g/dL — ABNORMAL LOW (ref 3.5–5.0)
Alkaline Phosphatase: 58 U/L (ref 38–126)
Anion gap: 11 (ref 5–15)
BUN: 9 mg/dL (ref 8–23)
CO2: 20 mmol/L — ABNORMAL LOW (ref 22–32)
Calcium: 8.5 mg/dL — ABNORMAL LOW (ref 8.9–10.3)
Chloride: 105 mmol/L (ref 98–111)
Creatinine, Ser: 0.5 mg/dL (ref 0.44–1.00)
GFR, Estimated: >60 mL/min (ref >60)
Glucose, Bld: 161 mg/dL — ABNORMAL HIGH (ref 70–99)
Potassium: 3.6 mmol/L (ref 3.5–5.1)
Sodium: 136 mmol/L (ref 135–145)
Total Bilirubin: 0.6 mg/dL (ref 0.0–1.2)
Total Protein: 4.4 g/dL — ABNORMAL LOW (ref 6.5–8.1)

## 2023-07-26 LAB — POCT I-STAT 7, (LYTES, BLD GAS, ICA,H+H)
Acid-base deficit: 4 mmol/L — ABNORMAL HIGH (ref 0.0–2.0)
Bicarbonate: 21.4 mmol/L (ref 20.0–28.0)
Calcium, Ion: 1.2 mmol/L (ref 1.15–1.40)
HCT: 27 % — ABNORMAL LOW (ref 36.0–46.0)
Hemoglobin: 9.2 g/dL — ABNORMAL LOW (ref 12.0–15.0)
O2 Saturation: 99 %
Potassium: 3.5 mmol/L (ref 3.5–5.1)
Sodium: 138 mmol/L (ref 135–145)
TCO2: 23 mmol/L (ref 22–32)
pCO2 arterial: 38 mmHg (ref 32–48)
pH, Arterial: 7.359 (ref 7.35–7.45)
pO2, Arterial: 163 mmHg — ABNORMAL HIGH (ref 83–108)

## 2023-07-26 LAB — CBC
HCT: 29.1 % — ABNORMAL LOW (ref 36.0–46.0)
Hemoglobin: 9.8 g/dL — ABNORMAL LOW (ref 12.0–15.0)
MCH: 32.1 pg (ref 26.0–34.0)
MCHC: 33.7 g/dL (ref 30.0–36.0)
MCV: 95.4 fL (ref 80.0–100.0)
Platelets: 164 10*3/uL (ref 150–400)
RBC: 3.05 MIL/uL — ABNORMAL LOW (ref 3.87–5.11)
RDW: 15.3 % (ref 11.5–15.5)
WBC: 5 10*3/uL (ref 4.0–10.5)
nRBC: 0 % (ref 0.0–0.2)

## 2023-07-26 LAB — I-STAT CHEM 8, ED
BUN: 15 mg/dL (ref 8–23)
Calcium, Ion: 1.23 mmol/L (ref 1.15–1.40)
Chloride: 101 mmol/L (ref 98–111)
Creatinine, Ser: 0.6 mg/dL (ref 0.44–1.00)
Glucose, Bld: 136 mg/dL — ABNORMAL HIGH (ref 70–99)
HCT: 35 % — ABNORMAL LOW (ref 36.0–46.0)
Hemoglobin: 11.9 g/dL — ABNORMAL LOW (ref 12.0–15.0)
Potassium: 3.7 mmol/L (ref 3.5–5.1)
Sodium: 137 mmol/L (ref 135–145)
TCO2: 24 mmol/L (ref 22–32)

## 2023-07-26 LAB — DIFFERENTIAL
Abs Immature Granulocytes: 0.01 10*3/uL (ref 0.00–0.07)
Basophils Absolute: 0 10*3/uL (ref 0.0–0.1)
Basophils Relative: 0 %
Eosinophils Absolute: 0 10*3/uL (ref 0.0–0.5)
Eosinophils Relative: 0 %
Immature Granulocytes: 0 %
Lymphocytes Relative: 4 %
Lymphs Abs: 0.2 10*3/uL — ABNORMAL LOW (ref 0.7–4.0)
Monocytes Absolute: 0.1 10*3/uL (ref 0.1–1.0)
Monocytes Relative: 2 %
Neutro Abs: 4.6 10*3/uL (ref 1.7–7.7)
Neutrophils Relative %: 94 %

## 2023-07-26 LAB — MRSA NEXT GEN BY PCR, NASAL: MRSA by PCR Next Gen: NOT DETECTED

## 2023-07-26 LAB — APTT: aPTT: 30 s (ref 24–36)

## 2023-07-26 LAB — PROTIME-INR
INR: 1.2 (ref 0.8–1.2)
Prothrombin Time: 15.3 s — ABNORMAL HIGH (ref 11.4–15.2)

## 2023-07-26 LAB — ETHANOL: Alcohol, Ethyl (B): <15 mg/dL (ref <15)

## 2023-07-26 LAB — CBG MONITORING, ED: Glucose-Capillary: 144 mg/dL — ABNORMAL HIGH (ref 70–99)

## 2023-07-26 SURGERY — RADIOLOGY WITH ANESTHESIA
Anesthesia: General

## 2023-07-26 MED ORDER — LABETALOL HCL 5 MG/ML IV SOLN
INTRAVENOUS | Status: DC | PRN
  Administered 2023-07-26 (×2): 10 mg via INTRAVENOUS

## 2023-07-26 MED ORDER — ALBUMIN HUMAN 5 % IV SOLN: INTRAVENOUS | Status: DC | PRN

## 2023-07-26 MED ORDER — IOHEXOL 300 MG/ML  SOLN
150.0000 mL | Freq: Once | INTRAMUSCULAR | Status: AC | PRN
  Administered 2023-07-26: 50 mL via INTRA_ARTERIAL

## 2023-07-26 MED ORDER — DEXAMETHASONE SODIUM PHOSPHATE 10 MG/ML IJ SOLN
INTRAMUSCULAR | Status: DC | PRN
  Administered 2023-07-26: 4 mg via INTRAVENOUS

## 2023-07-26 MED ORDER — ACETAMINOPHEN 650 MG RE SUPP: 650.0000 mg | RECTAL | Status: DC | PRN

## 2023-07-26 MED ORDER — FENTANYL CITRATE PF 50 MCG/ML IJ SOSY
25.0000 ug | PREFILLED_SYRINGE | INTRAMUSCULAR | Status: AC | PRN
  Administered 2023-07-28 – 2023-08-01 (×3): 25 ug via INTRAVENOUS
  Filled 2023-07-26 (×2): qty 1

## 2023-07-26 MED ORDER — FAMOTIDINE 20 MG PO TABS
20.0000 mg | ORAL_TABLET | Freq: Two times a day (BID) | ORAL | Status: DC
  Administered 2023-07-26 – 2023-08-07 (×25): 20 mg
  Filled 2023-07-26 (×25): qty 1

## 2023-07-26 MED ORDER — POLYETHYLENE GLYCOL 3350 17 G PO PACK
17.0000 g | PACK | Freq: Every day | ORAL | Status: DC
  Administered 2023-07-27 – 2023-08-07 (×9): 17 g
  Filled 2023-07-26 (×9): qty 1

## 2023-07-26 MED ORDER — ORAL CARE MOUTH RINSE: 15.0000 mL | OROMUCOSAL | Status: DC | PRN

## 2023-07-26 MED ORDER — CLEVIDIPINE BUTYRATE 0.5 MG/ML IV EMUL: 0.0000 mg/h | INTRAVENOUS | Status: DC

## 2023-07-26 MED ORDER — ROCURONIUM BROMIDE 10 MG/ML (PF) SYRINGE
PREFILLED_SYRINGE | INTRAVENOUS | Status: DC | PRN
  Administered 2023-07-26: 10 mg via INTRAVENOUS
  Administered 2023-07-26: 20 mg via INTRAVENOUS
  Administered 2023-07-26: 40 mg via INTRAVENOUS

## 2023-07-26 MED ORDER — PHENYLEPHRINE HCL-NACL 20-0.9 MG/250ML-% IV SOLN
INTRAVENOUS | Status: DC | PRN
  Administered 2023-07-26: 30 ug/min via INTRAVENOUS

## 2023-07-26 MED ORDER — STROKE: EARLY STAGES OF RECOVERY BOOK
Freq: Once | Status: AC
  Filled 2023-07-26: qty 1

## 2023-07-26 MED ORDER — ROSUVASTATIN CALCIUM 20 MG PO TABS
20.0000 mg | ORAL_TABLET | Freq: Every day | ORAL | Status: DC
  Administered 2023-07-26 – 2023-08-07 (×13): 20 mg
  Filled 2023-07-26 (×13): qty 1

## 2023-07-26 MED ORDER — FENTANYL CITRATE PF 50 MCG/ML IJ SOSY
25.0000 ug | PREFILLED_SYRINGE | INTRAMUSCULAR | Status: DC | PRN
  Administered 2023-07-29 (×7): 50 ug via INTRAVENOUS
  Administered 2023-07-30: 100 ug via INTRAVENOUS
  Administered 2023-07-30: 25 ug via INTRAVENOUS
  Administered 2023-07-30 (×2): 50 ug via INTRAVENOUS
  Administered 2023-07-30: 100 ug via INTRAVENOUS
  Administered 2023-07-30 (×4): 50 ug via INTRAVENOUS
  Administered 2023-07-31 (×2): 25 ug via INTRAVENOUS
  Administered 2023-07-31 – 2023-08-01 (×3): 50 ug via INTRAVENOUS
  Administered 2023-08-02 (×2): 25 ug via INTRAVENOUS
  Administered 2023-08-02 – 2023-08-06 (×5): 50 ug via INTRAVENOUS
  Filled 2023-07-26 (×3): qty 1
  Filled 2023-07-26: qty 2
  Filled 2023-07-26 (×3): qty 1
  Filled 2023-07-26: qty 2
  Filled 2023-07-26 (×13): qty 1
  Filled 2023-07-26: qty 2
  Filled 2023-07-26 (×5): qty 1

## 2023-07-26 MED ORDER — SODIUM CHLORIDE 0.9 % IV BOLUS: 250.0000 mL | INTRAVENOUS | Status: AC | PRN

## 2023-07-26 MED ORDER — PROPOFOL 10 MG/ML IV BOLUS
INTRAVENOUS | Status: DC | PRN
  Administered 2023-07-26: 130 mg via INTRAVENOUS

## 2023-07-26 MED ORDER — SODIUM CHLORIDE 0.9 % IV SOLN: INTRAVENOUS | Status: DC | PRN

## 2023-07-26 MED ORDER — PROPOFOL 1000 MG/100ML IV EMUL
0.0000 ug/kg/min | INTRAVENOUS | Status: DC
  Administered 2023-07-26: 45 ug/kg/min via INTRAVENOUS
  Administered 2023-07-26: 60 ug/kg/min via INTRAVENOUS
  Administered 2023-07-27: 35 ug/kg/min via INTRAVENOUS
  Administered 2023-07-27 (×3): 45 ug/kg/min via INTRAVENOUS
  Administered 2023-07-28: 25 ug/kg/min via INTRAVENOUS
  Administered 2023-07-28: 45 ug/kg/min via INTRAVENOUS
  Administered 2023-07-29: 35 ug/kg/min via INTRAVENOUS
  Administered 2023-07-29: 40 ug/kg/min via INTRAVENOUS
  Administered 2023-07-29 (×2): 30 ug/kg/min via INTRAVENOUS
  Administered 2023-07-30: 40 ug/kg/min via INTRAVENOUS
  Administered 2023-07-30: 30 ug/kg/min via INTRAVENOUS
  Administered 2023-07-30: 40 ug/kg/min via INTRAVENOUS
  Administered 2023-07-30 – 2023-07-31 (×2): 30 ug/kg/min via INTRAVENOUS
  Administered 2023-07-31 (×2): 25 ug/kg/min via INTRAVENOUS
  Administered 2023-08-01: 15 ug/kg/min via INTRAVENOUS
  Administered 2023-08-02: 10 ug/kg/min via INTRAVENOUS
  Administered 2023-08-03 (×2): 20 ug/kg/min via INTRAVENOUS
  Administered 2023-08-04 (×2): 30 ug/kg/min via INTRAVENOUS
  Administered 2023-08-05: 15 ug/kg/min via INTRAVENOUS
  Administered 2023-08-05 – 2023-08-06 (×2): 20 ug/kg/min via INTRAVENOUS
  Administered 2023-08-06: 15 ug/kg/min via INTRAVENOUS
  Administered 2023-08-07: 10 ug/kg/min via INTRAVENOUS
  Filled 2023-07-26 (×32): qty 100

## 2023-07-26 MED ORDER — IOHEXOL 300 MG/ML  SOLN
50.0000 mL | Freq: Once | INTRAMUSCULAR | Status: AC | PRN
  Administered 2023-07-26: 20 mL via INTRA_ARTERIAL

## 2023-07-26 MED ORDER — PROPOFOL 500 MG/50ML IV EMUL
INTRAVENOUS | Status: DC | PRN
  Administered 2023-07-26: 75 ug/kg/min via INTRAVENOUS

## 2023-07-26 MED ORDER — PHENYLEPHRINE 80 MCG/ML (10ML) SYRINGE FOR IV PUSH (FOR BLOOD PRESSURE SUPPORT)
PREFILLED_SYRINGE | INTRAVENOUS | Status: DC | PRN
  Administered 2023-07-26 (×2): 160 ug via INTRAVENOUS
  Administered 2023-07-26: 80 ug via INTRAVENOUS
  Administered 2023-07-26 (×2): 160 ug via INTRAVENOUS

## 2023-07-26 MED ORDER — ONDANSETRON HCL 4 MG/2ML IJ SOLN
INTRAMUSCULAR | Status: DC | PRN
  Administered 2023-07-26: 4 mg via INTRAVENOUS

## 2023-07-26 MED ORDER — SENNOSIDES-DOCUSATE SODIUM 8.6-50 MG PO TABS
1.0000 | ORAL_TABLET | Freq: Every evening | ORAL | Status: DC | PRN
Start: 2023-07-26 — End: 2023-07-29
  Administered 2023-07-29: 1 via ORAL
  Filled 2023-07-26: qty 1

## 2023-07-26 MED ORDER — ACETAMINOPHEN 325 MG PO TABS
650.0000 mg | ORAL_TABLET | ORAL | Status: DC | PRN
  Administered 2023-08-01: 650 mg via ORAL
  Filled 2023-07-26: qty 2

## 2023-07-26 MED ORDER — IOHEXOL 350 MG/ML SOLN
100.0000 mL | Freq: Once | INTRAVENOUS | Status: AC | PRN
  Administered 2023-07-26: 100 mL via INTRAVENOUS

## 2023-07-26 MED ORDER — DOCUSATE SODIUM 50 MG/5ML PO LIQD
100.0000 mg | Freq: Two times a day (BID) | ORAL | Status: DC
Start: 2023-07-26 — End: 2023-08-08
  Administered 2023-07-26 – 2023-08-07 (×24): 100 mg
  Filled 2023-07-26 (×24): qty 10

## 2023-07-26 MED ORDER — ACETAMINOPHEN 160 MG/5ML PO SOLN
650.0000 mg | ORAL | Status: DC | PRN
  Administered 2023-07-27 – 2023-08-06 (×20): 650 mg
  Filled 2023-07-26 (×21): qty 20.3

## 2023-07-26 MED ORDER — ORAL CARE MOUTH RINSE
15.0000 mL | OROMUCOSAL | Status: DC
  Administered 2023-07-26 – 2023-08-08 (×154): 15 mL via OROMUCOSAL

## 2023-07-26 MED ORDER — SUCCINYLCHOLINE CHLORIDE 200 MG/10ML IV SOSY
PREFILLED_SYRINGE | INTRAVENOUS | Status: DC | PRN
  Administered 2023-07-26: 100 mg via INTRAVENOUS

## 2023-07-26 NOTE — Progress Notes (Signed)
 BLE venous duplex has been completed.   Results can be found under chart review under CV PROC. 07/26/2023 5:12 PM Aika Brzoska RVT, RDMS

## 2023-07-26 NOTE — Anesthesia Preprocedure Evaluation (Signed)
 Anesthesia Evaluation  Patient identified by MRN, date of birth, ID band Patient awake    Reviewed: Allergy & Precautions, H&P , NPO status , Patient's Chart, lab work & pertinent test resultsPreop documentation limited or incomplete due to emergent nature of procedure.  Airway Mallampati: Unable to assess       Dental   Pulmonary neg pulmonary ROS   breath sounds clear to auscultation       Cardiovascular hypertension, + DVT   Rhythm:regular Rate:Normal     Neuro/Psych Code stroke  Neuromuscular disease CVA    GI/Hepatic hiatal hernia, PUD,GERD  ,,  Endo/Other    Renal/GU      Musculoskeletal   Abdominal   Peds  Hematology   Anesthesia Other Findings   Reproductive/Obstetrics                             Anesthesia Physical Anesthesia Plan  ASA: 4 and emergent  Anesthesia Plan: General   Post-op Pain Management:    Induction: Intravenous  PONV Risk Score and Plan: 3 and Ondansetron , Dexamethasone  and Treatment may vary due to age or medical condition  Airway Management Planned: Oral ETT  Additional Equipment: Arterial line  Intra-op Plan:   Post-operative Plan: Possible Post-op intubation/ventilation  Informed Consent: I have reviewed the patients History and Physical, chart, labs and discussed the procedure including the risks, benefits and alternatives for the proposed anesthesia with the patient or authorized representative who has indicated his/her understanding and acceptance.       Plan Discussed with: CRNA, Anesthesiologist and Surgeon  Anesthesia Plan Comments:        Anesthesia Quick Evaluation

## 2023-07-26 NOTE — Progress Notes (Signed)
 SLP Cancellation Note  Patient Details Name: Rachel Carlson MRN: [id number] DOB: [birth year]   Cancelled treatment:       Reason Eval/Treat Not Completed: Patient at procedure or test/unavailable;Medical issues which prohibited therapy;Patient not medically ready (Pt in IR for thrombectomy. Will continue efforts as appropriate.)  Rachel Carlson, M.S., CCC-SLP Speech-Language Pathologist Secure Chat Preferred  O: [phone]  Rachel Carlson 07/26/2023, 1:07 PM

## 2023-07-26 NOTE — Anesthesia Procedure Notes (Signed)
 Procedure Name: Intubation Date/Time: 07/26/2023 12:32 PM  Performed by: Christopher Comings, CRNAPre-anesthesia Checklist: Patient identified, Emergency Drugs available, Suction available and Patient being monitored Patient Re-evaluated:Patient Re-evaluated prior to induction Oxygen Delivery Method: Circle system utilized Preoxygenation: Pre-oxygenation with 100% oxygen Induction Type: IV induction and Rapid sequence Ventilation: Mask ventilation without difficulty Laryngoscope Size: Mac and 4 Grade View: Grade II Tube type: Oral Tube size: 7.0 mm Number of attempts: 1 Airway Equipment and Method: Stylet and Oral airway Placement Confirmation: ETT inserted through vocal cords under direct vision, positive ETCO2 and breath sounds checked- equal and bilateral Secured at: 22 cm Tube secured with: Tape Dental Injury: Teeth and Oropharynx as per pre-operative assessment

## 2023-07-26 NOTE — ED Provider Notes (Signed)
 [place] EMERGENCY DEPARTMENT AT [facility] Provider Note   CSN: [id number] Arrival date & time: 07/26/23  1132  An emergency department physician performed an initial assessment on this suspected stroke patient at 1136.  Patient presents with: Code Stroke   Rachel Carlson is a 78 y.o. female.   HPI     Last known normal at 10 AM.  Patient was found on the bathroom floor with left-sided weakness, slurred speech and a forced gaze.  Patient is currently taking Eliquis .  Previous medical history reviewed : Patient has a history of peritoneal carcinomatosis/ovarian cancer.  Patient was admitted back in May because of small bowel obstruction.  No surgical intervention.  Patient on Eliquis  and history of DVT.   Prior to Admission medications   Medication Sig Start Date End Date Taking? Authorizing Provider  apixaban  (ELIQUIS ) 5 MG TABS tablet Take 1 tablet (5 mg total) by mouth 2 (two) times daily. 07/23/23   Tower, Laine LABOR, MD  Ascorbic Acid  (VITAMIN C ) 1000 MG tablet Take 1,000 mg by mouth daily.    [provider]  calcium  citrate-vitamin D  (CITRACAL+D) 315-200 MG-UNIT per tablet Take 1 tablet by mouth 2 (two) times daily.    [provider]  Cholecalciferol (VITAMIN D -3) 5000 UNITS TABS Take 5,000 Units by mouth daily.    [provider]  Coenzyme Q10 (CO Q-10) 200 MG CAPS Take 200 mg by mouth daily.    [provider]  CRANBERRY PO Take 650 mg by mouth daily.    [provider]  Glucosamine-Chondroitin (GLUCOSAMINE CHONDR COMPLEX PO) Take 2 capsules by mouth daily.    [provider]  GRAPE SEED EXTRACT PO Take 1 capsule by mouth daily.    [provider]  ketoconazole  (NIZORAL ) 2 % cream Apply 1 Application topically daily as needed for irritation.    [provider]  lidocaine -prilocaine  (EMLA ) cream Apply 1 Application topically as needed. 04/22/23   [provider]  meclizine   (ANTIVERT ) 25 MG tablet TAKE 1 TABLET BY MOUTH THREE TIMES DAILY AS NEEDED FOR NAUSEA OR DIZZINESS 05/06/23   Tower, Laine LABOR, MD  milk thistle 175 MG tablet Take 175 mg by mouth daily.    [provider]  niraparib  tosylate (ZEJULA ) 200 MG tablet Take 1 tablet (200 mg total) by mouth daily. May take at bedtime to reduce nausea and vomiting. 06/26/23   Rogers Hai, MD  Omega-3 Fatty Acids (FISH OIL TRIPLE STRENGTH PO) Take 1 capsule by mouth daily.    [provider]  ondansetron  (ZOFRAN ) 8 MG tablet Take 8 mg by mouth every 8 (eight) hours as needed for nausea or vomiting.    [provider]  Polyethyl Glycol-Propyl Glycol (SYSTANE OP) Place 1 drop into both eyes 4 (four) times daily as needed (for dryness).    [provider]  PREVACID  24HR 15 MG capsule Take 15 mg by mouth See admin instructions. Take 15 mg by mouth in the morning before any food and an additional 15 mg once a day as needed for unresolved reflux    [provider]  prochlorperazine  (COMPAZINE ) 10 MG tablet Take 10 mg by mouth every 6 (six) hours as needed for nausea or vomiting.    [provider]  rosuvastatin  (CRESTOR ) 20 MG tablet Take 1 tablet (20 mg total) by mouth at bedtime. 08/20/22   Tower, Laine LABOR, MD  temazepam  (RESTORIL ) 30 MG capsule Take 1 capsule (30 mg total) by mouth at  bedtime as needed for sleep. 03/05/23   Rogers Hai, MD  triamcinolone  cream (KENALOG ) 0.5 % Apply 1 Application topically 2 (two) times daily. 06/20/23   Bedsole, Amy E, MD  vitamin B-12 (CYANOCOBALAMIN ) 1000 MCG tablet Take 1,000 mcg by mouth daily.    [provider]  zinc gluconate 50 MG tablet Take 50 mg by mouth daily.    [provider]    Allergies: Aspartame, Levaquin  [levofloxacin ], Pantoprazole  sodium, Ranitidine, Omeprazole , and Sulfasalazine     Review of Systems  Updated Vital Signs BP 129/69   Pulse 100   Temp 98.2 F (36.8 C) (Oral)   Resp 16    Ht 5' 2 (1.575 m)   Wt 74 kg   LMP 05/29/1996   SpO2 100%   BMI 29.84 kg/m   Physical Exam Vitals and nursing note reviewed.  Constitutional:      Appearance: She is well-developed.  HENT:     Head: Normocephalic and atraumatic.   Eyes:     Conjunctiva/sclera: Conjunctivae normal.    Cardiovascular:     Rate and Rhythm: Normal rate and regular rhythm.     Heart sounds: No murmur heard. Pulmonary:     Effort: Pulmonary effort is normal. No respiratory distress.     Breath sounds: Normal breath sounds.  Abdominal:     Palpations: Abdomen is soft.     Tenderness: There is no abdominal tenderness.   Musculoskeletal:        General: No swelling.     Cervical back: Neck supple.   Skin:    General: Skin is warm and dry.     Capillary Refill: Capillary refill takes less than 2 seconds.   Neurological:     Mental Status: She is alert.     Comments: Refer to NIH documentation   Psychiatric:        Mood and Affect: Mood normal.     NIH Stroke Scale Dizziness Present: No Headache Present: No Interval: Initial Level of Consciousness (1a.) : 0 LOC Questions (1b. ) : 0 LOC Commands (1c. ) : 0 Best Gaze (2. ) : 2 Visual (3. ) : 2 Facial Palsy (4. ) : 2 Motor Arm, Left (5a. ) : 2 Motor Arm, Right (5b. ) : 0 Motor Leg, Left (6a. ) : 3 Motor Leg, Right (6b. ) : 1 Limb Ataxia (7. ): 0 Sensory (8. ) : 1 Best Language (9. ) : 0 Dysarthria (10. ): 1 Extinction/Inattention (11.) : 1 Complete NIHSS TOTAL: 15 Large Vessel Occlusion (VAN) Screening Weakness: Severe Visual Disturbance : Left Field Cut Aphasia: NONE Neglect: Forced gaze VAN Screening: VAN Positive Abnormal      (all labs ordered are listed, but only abnormal results are displayed) Labs Reviewed  I-STAT CHEM 8, ED - Abnormal; Notable for the following components:      Result Value   Glucose, Bld 136 (*)    Hemoglobin 11.9 (*)    HCT 35.0 (*)    All other components within normal limits  CBG  MONITORING, ED - Abnormal; Notable for the following components:   Glucose-Capillary 144 (*)    All other components within normal limits  POCT I-STAT 7, (LYTES, BLD GAS, ICA,H+H) - Abnormal; Notable for the following components:   pO2, Arterial 163 (*)    Acid-base deficit 4.0 (*)    HCT 27.0 (*)    Hemoglobin 9.2 (*)    All other components within normal limits  ETHANOL  PROTIME-INR  APTT  CBC  DIFFERENTIAL  COMPREHENSIVE METABOLIC PANEL WITH GFR  RAPID URINE DRUG SCREEN, HOSP PERFORMED  BLOOD GAS, ARTERIAL    EKG: None  Radiology: IR PERCUTANEOUS ART THROMBECTOMY/INFUSION INTRACRANIAL INC DIAG ANGIO Result Date: 07/26/2023 PROCEDURE PERFORMED: 1. Stroke thrombectomy 2. Ultrasound vascular access 3. Cone beam CT for treatment planning COMPARISON:  CT perfusion performed May 07, 2023. CLINICAL DATA:  78 year old female with acute ischemic stroke with right MCA syndrome in CT imaging compatible with the right M1 occlusion. Pre treatment aspects score measured 7. NIH stroke scale was measured at 20. Patient was anticoagulated and not a candidate for thrombo lytic therapy. After multidisciplinary consensus conference, the patient's husband was offered stroke thrombectomy for his wife and provided informed consent. INDICATION: Acute ischemic stroke. ANESTHESIA/SEDATION: General anesthesia was utilized for the procedure. CONTRAST:  Approximately 70 cc Omnipaque  300 MEDICATIONS: None. FLUOROSCOPY TIME:  Fluoroscopy Time: 23 minutes (873 mGy). COMPLICATIONS: None immediate. BODY OF REPORT: Following a full explanation of the procedure along with the potential associated complications, an informed witnessed consent was obtained. The patient was then placed under general anesthesia by the Department of Anesthesiology at [facility]. The right groin was prepped and draped in the usual sterile fashion. Ultrasound was used to study the right common femoral artery which was patent. Using  real-time ultrasound guidance, a 21 gauge introducer needle was used to access the right common femoral artery. Access was performed at 1241 p.m. A hard copy image ultrasound the same date uncertain PACS. Using this access, a 6 French sheath was placed in the descending thoracic aorta. Next, selective catheterization the right internal carotid artery was performed. A selective arteriogram was performed which demonstrated that the internal carotid artery was patent. A right M1 occlusion was identified, pre treatment TICI score = 0. Next, a penumbra 43 and red 62 catheter was used to selectively catheterize the right M1 segment. Selective catheterization was achieved at 1302. Suction thrombectomy was performed at this site which did not yield significant thrombus. Next, a second pass was performed using the same system at 13:08. This also did not yield significant thrombus. I changed approach at this point and I deployed in [employer]MB[employer] trap thrombectomy device in the right M1 segment at 13:13. This did not yield significant thrombus and therefore a second attempt was performed at 13:22 using this device. As this was unsuccessful I elected to transition to the [facility] device. Selective catheterization of the MCA was performed and a solitaire 6 mm device was deployed at 13:29. This did not yield significant thrombus in therefore this was deployed again at 13:37 as well as at 13:44. After reviewing the available imaging and repeating a arteriogram with no significant progress I elected to terminate the procedure due to increasing risk of complication and decreasing chance of technical success. Post treatment TICI score = 0. Evaluation of the right femoral access site demonstrated at this site was suitable for closure device. A 6 French Angio-Seal device was deployed without complication. Cone beam CT was then performed to evaluate for intracranial hemorrhage and treatment planning. This demonstrated no evidence significant  acute intracranial hemorrhage. The patient was removed from anesthesia and transferred to recovery in stable condition. IMPRESSION: 1. Unsuccessful right MCA thrombectomy (TICI=0). PLAN: 1. To ICU for postoperative supportive care. Electronically Signed   By: Maude Naegeli M.D.   On: 07/26/2023 15:04   IR US  Guide Vasc Access Right Result Date: 07/26/2023 PROCEDURE PERFORMED: 1. Stroke thrombectomy 2. Ultrasound vascular access 3. Cone beam  CT for treatment planning COMPARISON:  CT perfusion performed May 07, 2023. CLINICAL DATA:  78 year old female with acute ischemic stroke with right MCA syndrome in CT imaging compatible with the right M1 occlusion. Pre treatment aspects score measured 7. NIH stroke scale was measured at 20. Patient was anticoagulated and not a candidate for thrombo lytic therapy. After multidisciplinary consensus conference, the patient's husband was offered stroke thrombectomy for his wife and provided informed consent. INDICATION: Acute ischemic stroke. ANESTHESIA/SEDATION: General anesthesia was utilized for the procedure. CONTRAST:  Approximately 70 cc Omnipaque  300 MEDICATIONS: None. FLUOROSCOPY TIME:  Fluoroscopy Time: 23 minutes (873 mGy). COMPLICATIONS: None immediate. BODY OF REPORT: Following a full explanation of the procedure along with the potential associated complications, an informed witnessed consent was obtained. The patient was then placed under general anesthesia by the Department of Anesthesiology at [facility]. The right groin was prepped and draped in the usual sterile fashion. Ultrasound was used to study the right common femoral artery which was patent. Using real-time ultrasound guidance, a 21 gauge introducer needle was used to access the right common femoral artery. Access was performed at 1241 p.m. A hard copy image ultrasound the same date uncertain PACS. Using this access, a 6 French sheath was placed in the descending thoracic aorta. Next, selective  catheterization the right internal carotid artery was performed. A selective arteriogram was performed which demonstrated that the internal carotid artery was patent. A right M1 occlusion was identified, pre treatment TICI score = 0. Next, a penumbra 43 and red 62 catheter was used to selectively catheterize the right M1 segment. Selective catheterization was achieved at 1302. Suction thrombectomy was performed at this site which did not yield significant thrombus. Next, a second pass was performed using the same system at 13:08. This also did not yield significant thrombus. I changed approach at this point and I deployed in EMBO trap thrombectomy device in the right M1 segment at 13:13. This did not yield significant thrombus and therefore a second attempt was performed at 13:22 using this device. As this was unsuccessful I elected to transition to the [facility] device. Selective catheterization of the MCA was performed and a solitaire 6 mm device was deployed at 13:29. This did not yield significant thrombus in therefore this was deployed again at 13:37 as well as at 13:44. After reviewing the available imaging and repeating a arteriogram with no significant progress I elected to terminate the procedure due to increasing risk of complication and decreasing chance of technical success. Post treatment TICI score = 0. Evaluation of the right femoral access site demonstrated at this site was suitable for closure device. A 6 French Angio-Seal device was deployed without complication. Cone beam CT was then performed to evaluate for intracranial hemorrhage and treatment planning. This demonstrated no evidence significant acute intracranial hemorrhage. The patient was removed from anesthesia and transferred to recovery in stable condition. IMPRESSION: 1. Unsuccessful right MCA thrombectomy (TICI=0). PLAN: 1. To ICU for postoperative supportive care. Electronically Signed   By: Maude Naegeli M.D.   On: 07/26/2023 15:04    IR CT Head Ltd Result Date: 07/26/2023 PROCEDURE PERFORMED: 1. Stroke thrombectomy 2. Ultrasound vascular access 3. Cone beam CT for treatment planning COMPARISON:  CT perfusion performed May 07, 2023. CLINICAL DATA:  78 year old female with acute ischemic stroke with right MCA syndrome in CT imaging compatible with the right M1 occlusion. Pre treatment aspects score measured 7. NIH stroke scale was measured at 20. Patient was anticoagulated and not  a candidate for thrombo lytic therapy. After multidisciplinary consensus conference, the patient's husband was offered stroke thrombectomy for his wife and provided informed consent. INDICATION: Acute ischemic stroke. ANESTHESIA/SEDATION: General anesthesia was utilized for the procedure. CONTRAST:  Approximately 70 cc Omnipaque  300 MEDICATIONS: None. FLUOROSCOPY TIME:  Fluoroscopy Time: 23 minutes (873 mGy). COMPLICATIONS: None immediate. BODY OF REPORT: Following a full explanation of the procedure along with the potential associated complications, an informed witnessed consent was obtained. The patient was then placed under general anesthesia by the Department of Anesthesiology at [facility]. The right groin was prepped and draped in the usual sterile fashion. Ultrasound was used to study the right common femoral artery which was patent. Using real-time ultrasound guidance, a 21 gauge introducer needle was used to access the right common femoral artery. Access was performed at 1241 p.m. A hard copy image ultrasound the same date uncertain PACS. Using this access, a 6 French sheath was placed in the descending thoracic aorta. Next, selective catheterization the right internal carotid artery was performed. A selective arteriogram was performed which demonstrated that the internal carotid artery was patent. A right M1 occlusion was identified, pre treatment TICI score = 0. Next, a penumbra 43 and red 62 catheter was used to selectively catheterize the  right M1 segment. Selective catheterization was achieved at 1302. Suction thrombectomy was performed at this site which did not yield significant thrombus. Next, a second pass was performed using the same system at 13:08. This also did not yield significant thrombus. I changed approach at this point and I deployed in EMBO trap thrombectomy device in the right M1 segment at 13:13. This did not yield significant thrombus and therefore a second attempt was performed at 13:22 using this device. As this was unsuccessful I elected to transition to the [facility] device. Selective catheterization of the MCA was performed and a solitaire 6 mm device was deployed at 13:29. This did not yield significant thrombus in therefore this was deployed again at 13:37 as well as at 13:44. After reviewing the available imaging and repeating a arteriogram with no significant progress I elected to terminate the procedure due to increasing risk of complication and decreasing chance of technical success. Post treatment TICI score = 0. Evaluation of the right femoral access site demonstrated at this site was suitable for closure device. A 6 French Angio-Seal device was deployed without complication. Cone beam CT was then performed to evaluate for intracranial hemorrhage and treatment planning. This demonstrated no evidence significant acute intracranial hemorrhage. The patient was removed from anesthesia and transferred to recovery in stable condition. IMPRESSION: 1. Unsuccessful right MCA thrombectomy (TICI=0). PLAN: 1. To ICU for postoperative supportive care. Electronically Signed   By: Maude Naegeli M.D.   On: 07/26/2023 15:04   CT ANGIO HEAD NECK W WO CM W PERF (CODE STROKE) Result Date: 07/26/2023 CLINICAL DATA:  Neuro deficit, concern for stroke, acute right MCA territory infarct on noncontrast CT. Left-sided weakness and slurred speech. EXAM: CT ANGIOGRAPHY HEAD AND NECK CT PERFUSION BRAIN TECHNIQUE: Multidetector CT imaging of the  head and neck was performed using the standard protocol during bolus administration of intravenous contrast. Multiplanar CT image reconstructions and MIPs were obtained to evaluate the vascular anatomy. Carotid stenosis measurements (when applicable) are obtained utilizing NASCET criteria, using the distal internal carotid diameter as the denominator. Multiphase CT imaging of the brain was performed following IV bolus contrast injection. Subsequent parametric perfusion maps were calculated using RAPID software. RADIATION DOSE REDUCTION: This  exam was performed according to the departmental dose-optimization program which includes automated exposure control, adjustment of the mA and/or kV according to patient size and/or use of iterative reconstruction technique. CONTRAST:  OMNIPAQUE  IOHEXOL  350 MG/ML SOLN COMPARISON:  Same-day head CT. FINDINGS: CTA NECK FINDINGS Aortic arch: Common origin of the brachiocephalic and left common carotid arteries. Imaged portion shows no evidence of aneurysm or dissection. Mild atherosclerosis of the visualized aortic arch. No significant stenosis of the major arch vessel origins. Pulmonary arteries: As permitted by contrast timing, there are no filling defects in the visualized pulmonary arteries. Subclavian arteries: The subclavian arteries are patent bilaterally. Right carotid system: No evidence of dissection, stenosis (50% or greater), or occlusion. Minimal atherosclerosis along the proximal cervical ICA. Left carotid system: No evidence of dissection, stenosis (50% or greater), or occlusion. Minimal atherosclerosis at the carotid bifurcation. Vertebral arteries: Codominant. No evidence of dissection, stenosis (50% or greater), or occlusion. The vertebral arteries are relatively small in caliber bilaterally. Slightly limited visualization of the distal V1 and proximal V2 segments of the left vertebral artery due to adjacent dense venous contrast. Distal tapering of the  right V4 segment after the origin of the right PICA. Skeleton: No acute or aggressive finding noted. Other neck: The visualized airway is patent. No cervical lymphadenopathy. Upper chest: Large bilateral pleural effusions, left greater than right. Partially visualized right chest wall Port-A-Cath. Review of the MIP images confirms the above findings CTA HEAD FINDINGS ANTERIOR CIRCULATION: The intracranial internal carotid arteries are patent bilaterally. There is minimal atherosclerosis involving the bilateral carotid siphons without significant stenosis. MCAs: There is abrupt occlusion of the proximal/mid M1 segment of the right MCA with possible intraluminal filling defect concerning for thrombus. There are minimal diminutive caliber M2 and M3 inferior division branches of the right MCA which are likely perfused via collaterals. The left MCA is patent. ACAs: The anterior cerebral arteries are patent bilaterally. POSTERIOR CIRCULATION: No significant stenosis, proximal occlusion, aneurysm, or vascular malformation. PCAs: The PCAs are patent bilaterally and are primarily supplied via the posterior communicating arteries. Diminutive P1 segments noted bilaterally. Pcomm: The posterior communicating arteries are visualized bilaterally. SCAs: The superior cerebellar arteries are patent bilaterally. Basilar artery: The basilar artery is patent but is diminutive in caliber with distal tapering. AICAs: Not well visualized. PICAs: Patent Vertebral arteries: The intracranial vertebral arteries are patent. Venous sinuses: As permitted by contrast timing, patent. Anatomic variants: None Review of the MIP images confirms the above findings CT Brain Perfusion Findings: ASPECTS: 7 CBF (<30%) Volume: 62mL Perfusion (Tmax>6.0s) volume: Mismatch Volume: 70mL Infarction Location:Core infarct throughout the right MCA territory. Surrounding region of elevated T-max with mismatch ratio of 2.1. IMPRESSION: Occlusion of the  proximal/mid M1 segment right MCA with intraluminal filling defect concerning for thrombus. Few diminutive caliber M2 and M3 branches noted likely filled via collaterals. 62 mL region of core infarct in the right MCA territory with surrounding at risk tissue. Mismatch ratio of 2.1. Intracranial arterial vasculature is otherwise patent. Findings suggestive of congenital vertebrobasilar hypoplasia. Large bilateral pleural effusions, left greater than right. Aortic Atherosclerosis (ICD10-I70.0). The first 2 impression points were communicated to Dr. Jerri at 12:12 pm on 07/26/2023 by text page via the [facility] messaging system. Electronically Signed   By: Donnice Mania M.D.   On: 07/26/2023 12:23   CT HEAD CODE STROKE WO CONTRAST Result Date: 07/26/2023 CLINICAL DATA:  Code stroke. Neuro deficit, acute, stroke suspected. Left-sided weakness, rightward gaze, and slurred speech. EXAM: CT  HEAD WITHOUT CONTRAST TECHNIQUE: Contiguous axial images were obtained from the base of the skull through the vertex without intravenous contrast. RADIATION DOSE REDUCTION: This exam was performed according to the departmental dose-optimization program which includes automated exposure control, adjustment of the mA and/or kV according to patient size and/or use of iterative reconstruction technique. COMPARISON:  None Available. FINDINGS: Brain: There is loss of gray-differentiation involving the right insula, a portion of the right lentiform nucleus, and likely a portion of the right temporal lobe consistent with an acute infarct. No intracranial hemorrhage, mass, midline shift, or extra-axial fluid collection is identified. Cerebral volume is normal. The ventricles are normal in size. Vascular: Hyperdense right MCA. Skull: No fracture or suspicious lesion. Sinuses/Orbits: The paranasal sinuses and mastoid air cells are well aerated. Bilateral cataract extraction. Other: None. ASPECTS ([place] Stroke Program Early CT Score) - Ganglionic  level infarction (caudate, lentiform nuclei, internal capsule, insula, M1-M3 cortex): 4 - Supraganglionic infarction (M4-M6 cortex): 3 Total score (0-10 with 10 being normal): 7 These results were communicated to Dr. Jerri at 11:51 am on 07/26/2023 by text page via the [facility] messaging system. IMPRESSION: 1. Acute right MCA infarct.  ASPECTS of 7. 2. No intracranial hemorrhage. Electronically Signed   By: Dasie Hamburg M.D.   On: 07/26/2023 11:51     Procedures   Medications Ordered in the ED   stroke: early stages of recovery book (has no administration in time range)  acetaminophen  (TYLENOL ) tablet 650 mg (has no administration in time range)    Or  acetaminophen  (TYLENOL ) 160 MG/5ML solution 650 mg (has no administration in time range)    Or  acetaminophen  (TYLENOL ) suppository 650 mg (has no administration in time range)  senna-docusate (Senokot-S) tablet 1 tablet (has no administration in time range)  sodium chloride  0.9 % bolus 250 mL (has no administration in time range)  clevidipine (CLEVIPREX) infusion 0.5 mg/mL (has no administration in time range)  docusate (COLACE) 50 MG/5ML liquid 100 mg (has no administration in time range)  polyethylene glycol (MIRALAX  / GLYCOLAX ) packet 17 g (has no administration in time range)  Oral care mouth rinse (has no administration in time range)  Oral care mouth rinse (has no administration in time range)  famotidine  (PEPCID ) tablet 20 mg (has no administration in time range)  propofol  (DIPRIVAN ) 1000 MG/100ML infusion (80 mcg/kg/min  74 kg Intravenous Continued from Pre-op 07/26/23 1500)  fentaNYL  (SUBLIMAZE ) injection 25 mcg (has no administration in time range)  fentaNYL  (SUBLIMAZE ) injection 25-100 mcg (has no administration in time range)  rosuvastatin  (CRESTOR ) tablet 20 mg (has no administration in time range)  iohexol  (OMNIPAQUE ) 350 MG/ML injection 100 mL (100 mLs Intravenous Contrast Given 07/26/23 1208)  iohexol  (OMNIPAQUE ) 300 MG/ML solution  150 mL (50 mLs Intra-arterial Contrast Given 07/26/23 1404)  iohexol  (OMNIPAQUE ) 300 MG/ML solution 50 mL (20 mLs Intra-arterial Contrast Given 07/26/23 1403)                      NIH Stroke Scale: 15              Medical Decision Making Amount and/or Complexity of Data Reviewed Labs: ordered. Radiology: ordered.  Risk Decision regarding hospitalization.    Previous medical history reviewed : Patient has a history of peritoneal carcinomatosis/ovarian cancer.  Patient was admitted back in May because of small bowel obstruction.  No surgical intervention.  Patient on Eliquis  and history of DVT.  Upon exam, patient NIH score is 15.  Concern  for LVO.  Protecting airway at this time.  No indication for emergent intubation.  Concerns for LVO as above.  Did obtain stat head CT.  Shows acute infarct.  CTA with perfusion consistent with LVO.  Neuro is at bedside with the patient.  Likely taking patient for thrombectomy.  Patient is currently on Eliquis .  Do not think any kind of metabolic or infectious etiology is driving patient's presentation.  Seems to be very consistent for CVA based off examination.  Further confirmed by imaging with a CTA of perfusion.   Patient taken to lab for thrombectomy.   Otherwise, soft benign abdomen.  No rebound guarding or tenderness.   CRITICAL CARE Performed by: Lavonia LOISE Pat   Total critical care time: 35 minutes  Critical care time was exclusive of separately billable procedures and treating other patients.  Critical care was necessary to treat or prevent imminent or life-threatening deterioration.  Critical care was time spent personally by me on the following activities: development of treatment plan with patient and/or surrogate as well as nursing, discussions with consultants, evaluation of patient's response to treatment, examination of patient, obtaining history from patient or surrogate, ordering and performing treatments and interventions,  ordering and review of laboratory studies, ordering and review of radiographic studies, pulse oximetry and re-evaluation of patient's condition.      Final diagnoses:  Cerebrovascular accident (CVA), unspecified mechanism [facility])    ED Discharge Orders     None          Pat Lavonia LOISE, MD 07/26/23 1537

## 2023-07-26 NOTE — Procedures (Signed)
 NIR Procedure Note  Preop Dx: Acute ischemic stroke, right M1 occlusion Post Dx: Same  Procedure: Stroke thrombectomy (unsuccessful)  Operator: Maude Naegeli MD  EBL: 400 mL Complications: No immediate  Findings: Unsuccessful thrombectomy after 7 passes with suction aspiration and stent retriever devices. TICI Score: 0  Length of Bedrest: 2 hours from NIR standpoint BP goal next 24 hours: SBP 120-160  Sheath Closure: Angioseal Disposition: To ICU, remaining intubated secondary to anesthesia concerns for airway protection and mental status.  Please call with questions, concerns, or change in patient condition  Maude Naegeli MD

## 2023-07-26 NOTE — Transfer of Care (Signed)
 Immediate Anesthesia Transfer of Care Note  Patient: Rachel Carlson  Procedure(s) Performed: RADIOLOGY WITH ANESTHESIA  Patient Location: ICU  Anesthesia Type:General  Level of Consciousness: Patient remains intubated per anesthesia plan  Airway & Oxygen Therapy: Patient remains intubated per anesthesia plan  Post-op Assessment: Report given to RN and Post -op Vital signs reviewed and stable  Post vital signs: Reviewed and stable  Last Vitals:  Vitals Value Taken Time  BP    Temp    Pulse 67 07/26/23 14:41  Resp 24 07/26/23 14:41  SpO2 98 % 07/26/23 14:41  Vitals shown include unfiled device data.  Last Pain:  Vitals:   07/26/23 1242  TempSrc: Oral         Complications: No notable events documented.

## 2023-07-26 NOTE — Consult Note (Signed)
 NAME:  Rachel Carlson, MRN:  [id number], DOB:  [birth year], LOS: 0 ADMISSION DATE:  07/26/2023, CONSULTATION DATE:  07/26/23 REFERRING MD:  Ray GLENWOOD SORENSON, CHIEF COMPLAINT:  L sided weakness    History of Present Illness:  78 yo F w PMH prior DVT still on eliquis , ovarian ca and peritoneal carcinomatosis on niraparib  after several chemo cycles & s/p omenectomy small bowel resection, s/p colostomy c/b colostomy prolapse, who presented to ED 07/26/23 as code stroke. LKW 10:00. Was found sometime after than on the toilet, had falled couldn't get up and had L sided weakness, R gaze, L facial droop.  Imaging in ED w M1 occlusion. In ED was lethargic but able to state that she took eliquis  this morning.  She went for thrombectomy w NIR, unsuccessful revasc.  Remains intubated after the case as CRNA saw possible posturing and there were concerns regarding plausibility of airway protection  PCCM is consulted in this setting   Pertinent  Medical History  Ovarian cancer w peritoneal carcinomatosis  On DVT Chronic AC  S/p colostomy OAB Neuropathy OAB  Prediabetes  Significant Hospital Events: Including procedures, antibiotic start and stop dates in addition to other pertinent events   M1 occlusion, unsuccessful thrombectomy. To ICU intubated   Interim History / Subjective:  Consulted   Objective    Blood pressure 129/69, pulse 100, temperature 98.2 F (36.8 C), temperature source Oral, resp. rate 16, height 5' 2 (1.575 m), weight 74 kg, last menstrual period 05/29/1996, SpO2 95%.    Vent Mode: PRVC FiO2 (%):  [60 %] 60 % Set Rate:  [18 bmp] 18 bmp Vt Set:  [400 mL] 400 mL PEEP:  [5 cmH20] 5 cmH20 Plateau Pressure:  [15 cmH20] 15 cmH20   Intake/Output Summary (Last 24 hours) at 07/26/2023 1510 Last data filed at 07/26/2023 1406 Gross per 24 hour  Intake 1250 ml  Output 200 ml  Net 1050 ml   Filed Weights   07/26/23 1100 07/26/23 1243  Weight: 74.2 kg 74 kg     Examination: General: critically ill older adult F intubated sedated NAD HENT: NCAT ETT secure anicteric sclera Lungs: CTAb mechanically ventilated  Cardiovascular: rr  Abdomen: soft round  Extremities: no obvious abnormality  Neuro: sedated  GU: no foley   Resolved problem list   Assessment and Plan    Endotracheally intubated P -CXR Abg -VAP, pulm hygiene -WUA/SBT 6/28   M1 occlusion -unsuccessful thrombectomy  P -stroke workup and imaging per stroke service (MRI/MRA, Echo, Lipids, A1c) -SBP goal 105-180 -bedrest duration per IR   DVT on eliquis   -neuro recs appreciated, holding until infarct can be assessed on MRI  -BLE US  ordered   Uterine cancer w Peritoneal carcinomatosis  -malignant effusion P -niraparib  200mg  daily on hold, cannot be given per tube   S/p colostomy, colostomy prolapse  -routine care   Chemo related neuropathy -no acute intervention, just noting as it may impact sensory component of neuro exam  Best Practice (right click and Reselect all SmartList Selections daily)   Diet/type: NPO DVT prophylaxis DOAC - on hold  Pressure ulcer(s): pressure ulcer assessment deferred  GI prophylaxis: H2B Lines: Arterial Line Foley:  N/A Code Status:  full code Last date of multidisciplinary goals of care discussion [per primary]  Labs   CBC: Recent Labs  Lab 07/23/23 1230 07/26/23 1140  WBC 4.6  --   NEUTROABS 2.9  --   HGB 12.7 11.9*  HCT 37.6 35.0*  MCV 93.3  --  PLT 212.0  --     Basic Metabolic Panel: Recent Labs  Lab 07/23/23 1230 07/26/23 1140  NA 135 137  K 4.2 3.7  CL 100 101  CO2 28  --   GLUCOSE 108* 136*  BUN 15 15  CREATININE 0.76 0.60  CALCIUM  9.8  --    GFR: Estimated Creatinine Clearance: 57.3 mL/min (by C-G formula based on SCr of 0.6 mg/dL). Recent Labs  Lab 07/23/23 1230  WBC 4.6    Liver Function Tests: No results for input(s): AST, ALT, ALKPHOS, BILITOT, PROT, ALBUMIN  in the last  168 hours. No results for input(s): LIPASE, AMYLASE in the last 168 hours. No results for input(s): AMMONIA in the last 168 hours.  ABG    Component Value Date/Time   TCO2 24 07/26/2023 1140     Coagulation Profile: No results for input(s): INR, PROTIME in the last 168 hours.  Cardiac Enzymes: No results for input(s): CKTOTAL, CKMB, CKMBINDEX, TROPONINI in the last 168 hours.  HbA1C: Hgb A1c MFr Bld  Date/Time Value Ref Range Status  07/23/2023 12:30 PM 6.3 4.6 - 6.5 % Final    Comment:    Glycemic Control Guidelines for People with Diabetes:Non Diabetic:  <6%Goal of Therapy: <7%Additional Action Suggested:  >8%   08/13/2022 08:43 AM 6.3 4.6 - 6.5 % Final    Comment:    Glycemic Control Guidelines for People with Diabetes:Non Diabetic:  <6%Goal of Therapy: <7%Additional Action Suggested:  >8%     CBG: Recent Labs  Lab 07/26/23 1135  GLUCAP 144*    Review of Systems:   Unable to obtain, intubated sedated   Past Medical History:  She,  has a past medical history of Blood transfusion without reported diagnosis, Chronic right ear pain, Diverticulosis, DVT of leg (deep venous thrombosis) (HCC), Esophageal stricture, Family history of breast cancer, Family history of colon cancer, Family history of melanoma, Family history of prostate cancer, Fibroids (1998), GERD (gastroesophageal reflux disease), History of hiatal hernia, History of uterine prolapse, adenomatous polyp of colon (10/19/2014), Hyperlipidemia, Hypertension, Nausea and vomiting (05/19/2023), Neuromuscular disorder (HCC), Obesity, Ovarian cancer (HCC), Peritoneal carcinomatosis (HCC), PVC (premature ventricular contraction), Squamous cell carcinoma of arm, Tubal pregnancy, Ulcerative proctosigmoiditis (HCC), and Urge urinary incontinence (06/03/2007).   Surgical History:   Past Surgical History:  Procedure Laterality Date   BILATERAL SALPINGECTOMY Bilateral 12/12/2022   Procedure: OPEN BILATERAL  SALPINGECTOMY-OOPHORECTOMY;  Surgeon: Viktoria Comer SAUNDERS, MD;  Location: [facility];  Service: Gynecology;  Laterality: Bilateral;   BLADDER NECK SUSPENSION  1998   BREAST BIOPSY Left    BREAST LUMPECTOMY Left    benign   CATARACT EXTRACTION Right 01/2020   CATARACT EXTRACTION W/PHACO Left 08/12/2020   Procedure: CATARACT EXTRACTION PHACO AND INTRAOCULAR LENS PLACEMENT LEFT EYE;  Surgeon: Harrie Agent, MD;  Location: [facility];  Service: Ophthalmology;  Laterality: Left;  left CDE=10.71   COLONOSCOPY  05/18/2011/10/12/14   Dr. Lupita Commander   DEBULKING N/A 12/12/2022   Procedure: RADICAL TUMOR DEBULKING, PERITONEAL STRIPPING, MOBILIZATION SPLENIC FLEXURE, RECTOSIGMOID RESECTION,SMALL BOWEL RESECTION AND REANASTOMOSIS ,END TO END DESCENDING COLOSTOMY;  Surgeon: Viktoria Comer SAUNDERS, MD;  Location: [facility];  Service: Gynecology;  Laterality: N/A;   ECTOPIC PREGNANCY SURGERY  1980   ESOPHAGOGASTRODUODENOSCOPY (EGD) WITH ESOPHAGEAL DILATION  04/03/2012   Dr. Lupita Commander   INCISION AND DRAINAGE ABSCESS N/A 01/02/2023   Procedure: INCISION AND DRAINAGE OF ABDOMINAL WOUND SEROMA;  Surgeon: Rogelio Planas, MD;  Location: [facility];  Service: Gynecology;  Laterality: N/A;  IR CT HEAD LTD  07/26/2023   IR IMAGING GUIDED PORT INSERTION  09/06/2022   IR PERCUTANEOUS ART THROMBECTOMY/INFUSION INTRACRANIAL INC DIAG ANGIO  07/26/2023   IR US  GUIDE VASC ACCESS RIGHT  07/26/2023   OMENTECTOMY N/A 12/12/2022   Procedure: OMENTECTOMY;  Surgeon: Viktoria Comer SAUNDERS, MD;  Location: [facility];  Service: Gynecology;  Laterality: N/A;   PARTIAL HYSTERECTOMY     abdominal, fibroids, and prolapse (1998) bladder tack   SALPINGOOPHORECTOMY Bilateral 12/12/2022   Procedure: DIAGNOSTIC LAPAROSCOPY;  Surgeon: Viktoria Comer SAUNDERS, MD;  Location: [facility];  Service: Gynecology;  Laterality: Bilateral;   SKIN CANCER EXCISION     UPPER GASTROINTESTINAL ENDOSCOPY       Social History:   reports that she has never smoked. She has never  used smokeless tobacco. She reports that she does not drink alcohol  and does not use drugs.   Family History:  Her family history includes Arrhythmia in her brother; Barrett's esophagus in her maternal aunt and mother; Breast cancer in her sister; Colon cancer in her cousin and another family member; Colon cancer (age of onset: 74) in her son; Colon cancer (age of onset: 38) in her brother; Colon polyps in her sister; Congestive Heart Failure in her maternal grandmother; Coronary artery disease in her mother; Diabetes in her mother; Diabetes type II in her mother; Ehlers-Danlos syndrome in her daughter; Heart attack (age of onset: 21) in her mother; Melanoma in her maternal uncle; Other in her son; Prostate cancer in her maternal grandfather; Skin cancer in her maternal aunt and mother; Sudden death in her mother; Sudden death (age of onset: 54) in her father; Transient ischemic attack in her mother; Tuberculosis in her paternal grandfather. There is no history of Esophageal cancer, Stomach cancer, Rectal cancer, Uterine cancer, or Bladder Cancer.   Allergies Allergies  Allergen Reactions   Aspartame     Intolerance to artifical sweeteners, pt states caused burning of bladder   Levaquin  [Levofloxacin ] Nausea Only   Pantoprazole  Sodium Diarrhea    Abdominal pain   Ranitidine Diarrhea    abdominal pain   Omeprazole  Diarrhea    Abdominal pain  Other Reaction(s): Not available   Sulfasalazine  Rash and Other (See Comments)    Fever, chills, headache, muscle pain also     Home Medications  Prior to Admission medications   Medication Sig Start Date End Date Taking? Authorizing Provider  apixaban  (ELIQUIS ) 5 MG TABS tablet Take 1 tablet (5 mg total) by mouth 2 (two) times daily. 07/23/23   Tower, Laine LABOR, MD  Ascorbic Acid  (VITAMIN C ) 1000 MG tablet Take 1,000 mg by mouth daily.    [provider]  calcium  citrate-vitamin D  (CITRACAL+D) 315-200 MG-UNIT per tablet Take 1 tablet by mouth 2  (two) times daily.    [provider]  Cholecalciferol (VITAMIN D -3) 5000 UNITS TABS Take 5,000 Units by mouth daily.    [provider]  Coenzyme Q10 (CO Q-10) 200 MG CAPS Take 200 mg by mouth daily.    [provider]  CRANBERRY PO Take 650 mg by mouth daily.    [provider]  Glucosamine-Chondroitin (GLUCOSAMINE CHONDR COMPLEX PO) Take 2 capsules by mouth daily.    [provider]  GRAPE SEED EXTRACT PO Take 1 capsule by mouth daily.    [provider]  ketoconazole  (NIZORAL ) 2 % cream Apply 1 Application topically daily as needed for irritation.    [provider]  lidocaine -prilocaine  (EMLA ) cream Apply 1 Application  topically as needed. 04/22/23   [provider]  meclizine  (ANTIVERT ) 25 MG tablet TAKE 1 TABLET BY MOUTH THREE TIMES DAILY AS NEEDED FOR NAUSEA OR DIZZINESS 05/06/23   Tower, Laine LABOR, MD  milk thistle 175 MG tablet Take 175 mg by mouth daily.    [provider]  niraparib  tosylate (ZEJULA ) 200 MG tablet Take 1 tablet (200 mg total) by mouth daily. May take at bedtime to reduce nausea and vomiting. 06/26/23   Rogers Hai, MD  Omega-3 Fatty Acids (FISH OIL TRIPLE STRENGTH PO) Take 1 capsule by mouth daily.    [provider]  ondansetron  (ZOFRAN ) 8 MG tablet Take 8 mg by mouth every 8 (eight) hours as needed for nausea or vomiting.    [provider]  Polyethyl Glycol-Propyl Glycol (SYSTANE OP) Place 1 drop into both eyes 4 (four) times daily as needed (for dryness).    [provider]  PREVACID  24HR 15 MG capsule Take 15 mg by mouth See admin instructions. Take 15 mg by mouth in the morning before any food and an additional 15 mg once a day as needed for unresolved reflux    [provider]  prochlorperazine  (COMPAZINE ) 10 MG tablet Take 10 mg by mouth every 6 (six) hours as needed for nausea or vomiting.    [provider]  rosuvastatin  (CRESTOR ) 20  MG tablet Take 1 tablet (20 mg total) by mouth at bedtime. 08/20/22   Tower, Laine LABOR, MD  temazepam  (RESTORIL ) 30 MG capsule Take 1 capsule (30 mg total) by mouth at bedtime as needed for sleep. 03/05/23   Rogers Hai, MD  triamcinolone  cream (KENALOG ) 0.5 % Apply 1 Application topically 2 (two) times daily. 06/20/23   Bedsole, Amy E, MD  vitamin B-12 (CYANOCOBALAMIN ) 1000 MCG tablet Take 1,000 mcg by mouth daily.    [provider]  zinc gluconate 50 MG tablet Take 50 mg by mouth daily.    [provider]     Critical care time:    CRITICAL CARE Performed by: Ronnald FORBES Gave   Total critical care time: 41 minutes  Critical care time was exclusive of separately billable procedures and treating other patients.  Critical care was necessary to treat or prevent imminent or life-threatening deterioration.  Critical care was time spent personally by me on the following activities: development of treatment plan with patient and/or surrogate as well as nursing, discussions with consultants, evaluation of patient's response to treatment, examination of patient, obtaining history from patient or surrogate, ordering and performing treatments and interventions, ordering and review of laboratory studies, ordering and review of radiographic studies, pulse oximetry and re-evaluation of patient's condition.  Ronnald Gave MSN, AGACNP-BC [facility] Amion for pager  07/26/2023, 3:10 PM

## 2023-07-26 NOTE — H&P (Signed)
 Stroke Neurology H&P Note  The history was obtained from the EMS and son Franky.  During history and examination, all items were able to obtain unless otherwise noted.  History of Present Illness:  Rachel Carlson is a 78 y.o. 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   Per EMS and son kevin, pt was at her baseline around 10am when she walked to bathroom. Then seems like she was bend over to pick up something on the floor while on the toilet, she fell and could not get up. Her husband went to see her and found she had left sided weakness. EMS called, on arrival, pt was found to have left arm and leg weakness, right gaze, left facial droop.  Code stroke activated.  BP 138/87, glucose 129.  On ED arrival, patient continued to have left hemiplegia, right gaze, left neglect.  Patient lethargic but able to open eyes, able to state that she to Eliquis  this morning.  CT no bleeding but right brain hypoattenuation with right MCA hyperdense sign with ASPECT 7.  CTA head and neck showed right M1 occlusion, CTP 62/132cc.  Discussed with interventional radiology Dr. Ray and patient husband Derrill over the phone, decision made to proceed with thrombectomy.  LSN: 10 AM TNK Given: No: On Eliquis  mRS = 2 IR: yes, discussed with interventional radiology Dr. Ray and patient husband Derrill over the phone, decision made to proceed with thrombectomy.  Past Medical History:  Diagnosis Date   Blood transfusion without reported diagnosis    Chronic right ear pain    normal MRI   Diverticulosis    DVT of leg (deep venous thrombosis) (HCC)    Esophageal stricture    Family history of breast cancer    Family  history of colon cancer    Family history of melanoma    Family history of prostate cancer    Fibroids 1998   uterine, history of (left ovaries)   GERD (gastroesophageal reflux disease)    History of hiatal hernia    History of uterine prolapse    Hx of adenomatous polyp of colon 10/19/2014   Hyperlipidemia    Hypertension    borderline no meds diet and exercise   Nausea and vomiting 05/19/2023   Neuromuscular disorder (HCC)    neuropathy from chemo   Obesity    Ovarian cancer (HCC)    Peritoneal carcinomatosis (HCC)    PVC (premature ventricular contraction)    first dx by holter in 1980's, echo (6/10) EF 55-60%, normal diastolic fxn, normal size RV and fxn, mild MR, PASP   Squamous cell carcinoma of arm    ovarian   Tubal pregnancy    Ulcerative proctosigmoiditis (HCC)    Urge urinary incontinence 06/03/2007   Qualifier: Diagnosis of  By: Cecilie CMA, NANNIE Ivy      Past Surgical History:  Procedure Laterality Date   BILATERAL SALPINGECTOMY Bilateral 12/12/2022   Procedure: OPEN BILATERAL SALPINGECTOMY-OOPHORECTOMY;  Surgeon: Viktoria Comer SAUNDERS, MD;  Location: [facility];  Service: Gynecology;  Laterality: Bilateral;   BLADDER NECK SUSPENSION  1998   BREAST BIOPSY Left    BREAST LUMPECTOMY Left    benign   CATARACT EXTRACTION Right 01/2020   CATARACT EXTRACTION W/PHACO Left  08/12/2020   Procedure: CATARACT EXTRACTION PHACO AND INTRAOCULAR LENS PLACEMENT LEFT EYE;  Surgeon: Harrie Agent, MD;  Location: [facility];  Service: Ophthalmology;  Laterality: Left;  left CDE=10.71   COLONOSCOPY  05/18/2011/10/12/14   Dr. Lupita Commander   DEBULKING N/A 12/12/2022   Procedure: RADICAL TUMOR DEBULKING, PERITONEAL STRIPPING, MOBILIZATION SPLENIC FLEXURE, RECTOSIGMOID RESECTION,SMALL BOWEL RESECTION AND REANASTOMOSIS ,END TO END DESCENDING COLOSTOMY;  Surgeon: Viktoria Comer SAUNDERS, MD;  Location: [facility];  Service: Gynecology;  Laterality: N/A;   ECTOPIC PREGNANCY SURGERY  1980    ESOPHAGOGASTRODUODENOSCOPY (EGD) WITH ESOPHAGEAL DILATION  04/03/2012   Dr. Lupita Commander   INCISION AND DRAINAGE ABSCESS N/A 01/02/2023   Procedure: INCISION AND DRAINAGE OF ABDOMINAL WOUND SEROMA;  Surgeon: Rogelio Planas, MD;  Location: [facility];  Service: Gynecology;  Laterality: N/A;   IR IMAGING GUIDED PORT INSERTION  09/06/2022   OMENTECTOMY N/A 12/12/2022   Procedure: OMENTECTOMY;  Surgeon: Viktoria Comer SAUNDERS, MD;  Location: [facility];  Service: Gynecology;  Laterality: N/A;   PARTIAL HYSTERECTOMY     abdominal, fibroids, and prolapse (1998) bladder tack   SALPINGOOPHORECTOMY Bilateral 12/12/2022   Procedure: DIAGNOSTIC LAPAROSCOPY;  Surgeon: Viktoria Comer SAUNDERS, MD;  Location: [facility];  Service: Gynecology;  Laterality: Bilateral;   SKIN CANCER EXCISION     UPPER GASTROINTESTINAL ENDOSCOPY      Family History  Problem Relation Age of Onset   Heart attack Mother 67   Transient ischemic attack Mother    Diabetes type II Mother    Sudden death Mother        died age 43   Diabetes Mother    Coronary artery disease Mother    Skin cancer Mother    Barrett's esophagus Mother    Sudden death Father 12       coronary arteriosclerosis on death certificate   Breast cancer Sister        dx in her 38s   Colon polyps Sister        adenomatous   Arrhythmia Brother    Colon cancer Brother 92   Congestive Heart Failure Maternal Grandmother    Prostate cancer Maternal Grandfather        dx in 22s   Tuberculosis Paternal Grandfather    Ehlers-Danlos syndrome Daughter    Colon cancer Son 76       lynch syndrome   Other Son        parotid gland tumor   Skin cancer Maternal Aunt    Barrett's esophagus Maternal Aunt    Melanoma Maternal Uncle    Colon cancer Cousin        mid 19s; maternal cousin   Colon cancer Other        MGMs brother   Esophageal cancer Neg Hx    Stomach cancer Neg Hx    Rectal cancer Neg Hx    Uterine cancer Neg Hx    Bladder Cancer Neg Hx     Social  History:  reports that she has never smoked. She has never used smokeless tobacco. She reports that she does not drink alcohol  and does not use drugs.  Allergies:  Allergies  Allergen Reactions   Aspartame     Intolerance to artifical sweeteners, pt states caused burning of bladder   Levaquin  [Levofloxacin ] Nausea Only   Pantoprazole  Sodium Diarrhea    Abdominal pain   Ranitidine Diarrhea    abdominal pain   Omeprazole  Diarrhea    Abdominal pain  Other Reaction(s): Not available  Sulfasalazine  Rash and Other (See Comments)    Fever, chills, headache, muscle pain also    No current facility-administered medications on file prior to encounter.   Current Outpatient Medications on File Prior to Encounter  Medication Sig Dispense Refill   apixaban  (ELIQUIS ) 5 MG TABS tablet Take 1 tablet (5 mg total) by mouth 2 (two) times daily. 180 tablet 2   Ascorbic Acid  (VITAMIN C ) 1000 MG tablet Take 1,000 mg by mouth daily.     calcium  citrate-vitamin D  (CITRACAL+D) 315-200 MG-UNIT per tablet Take 1 tablet by mouth 2 (two) times daily.     Cholecalciferol (VITAMIN D -3) 5000 UNITS TABS Take 5,000 Units by mouth daily.     Coenzyme Q10 (CO Q-10) 200 MG CAPS Take 200 mg by mouth daily.     CRANBERRY PO Take 650 mg by mouth daily.     Glucosamine-Chondroitin (GLUCOSAMINE CHONDR COMPLEX PO) Take 2 capsules by mouth daily.     GRAPE SEED EXTRACT PO Take 1 capsule by mouth daily.     ketoconazole  (NIZORAL ) 2 % cream Apply 1 Application topically daily as needed for irritation.     lidocaine -prilocaine  (EMLA ) cream Apply 1 Application topically as needed.     meclizine  (ANTIVERT ) 25 MG tablet TAKE 1 TABLET BY MOUTH THREE TIMES DAILY AS NEEDED FOR NAUSEA OR DIZZINESS 20 tablet 0   milk thistle 175 MG tablet Take 175 mg by mouth daily.     niraparib  tosylate (ZEJULA ) 200 MG tablet Take 1 tablet (200 mg total) by mouth daily. May take at bedtime to reduce nausea and vomiting. 30 tablet 1   Omega-3 Fatty  Acids (FISH OIL TRIPLE STRENGTH PO) Take 1 capsule by mouth daily.     ondansetron  (ZOFRAN ) 8 MG tablet Take 8 mg by mouth every 8 (eight) hours as needed for nausea or vomiting.     Polyethyl Glycol-Propyl Glycol (SYSTANE OP) Place 1 drop into both eyes 4 (four) times daily as needed (for dryness).     PREVACID  24HR 15 MG capsule Take 15 mg by mouth See admin instructions. Take 15 mg by mouth in the morning before any food and an additional 15 mg once a day as needed for unresolved reflux     prochlorperazine  (COMPAZINE ) 10 MG tablet Take 10 mg by mouth every 6 (six) hours as needed for nausea or vomiting.     rosuvastatin  (CRESTOR ) 20 MG tablet Take 1 tablet (20 mg total) by mouth at bedtime. 90 tablet 3   temazepam  (RESTORIL ) 30 MG capsule Take 1 capsule (30 mg total) by mouth at bedtime as needed for sleep. 30 capsule 0   triamcinolone  cream (KENALOG ) 0.5 % Apply 1 Application topically 2 (two) times daily. 30 g 0   vitamin B-12 (CYANOCOBALAMIN ) 1000 MCG tablet Take 1,000 mcg by mouth daily.     zinc gluconate 50 MG tablet Take 50 mg by mouth daily.      Review of Systems: A full ROS was attempted today and was able to be performed.  Systems assessed include - Constitutional, Eyes, HENT, Respiratory, Cardiovascular, Gastrointestinal, Genitourinary, Integument/breast, Hematologic/lymphatic, Musculoskeletal, Neurological, Behavioral/Psych, Endocrine, Allergic/Immunologic - with pertinent responses as per HPI.  Physical Examination: Temp:  [98.2 F (36.8 C)] 98.2 F (36.8 C) (06/27 1242) Pulse Rate:  [92-100] 100 (06/27 1242) Resp:  [16] 16 (06/27 1242) BP: (129-150)/(69-76) 129/69 (06/27 1242) SpO2:  [92 %-95 %] 95 % (06/27 1242) Weight:  [74 kg-74.2 kg] 74 kg (06/27 1243)  General - well nourished,  well developed, lethargic.    Ophthalmologic - fundi not visualized due to noncooperation.    Cardiovascular - regular rhythm and rate  Neuro - eyes closed but open to voice, able to  answer question with dysarthric voice, orientated to age, place, time. No aphasia, paucity of speech, only answer questions with short answers, following all simple commands. Able to name and repeat.  Right forced gaze, left visual field neglect versus hemianopia.  Left facial droop. Tongue protrusion slightly to the left.  Left hemiplegia, right upper and lower extremity at least 4/5. Sensation light on the left, R FTN slow without significant ataxia, gait not tested.   NIH Stroke Scale  Level Of Consciousness 0=Alert; keenly responsive 1=Arouse to minor stimulation 2=Requires repeated stimulation to arouse or movements to pain 3=postures or unresponsive 1  LOC Questions to Month and Age 80=Answers both questions correctly 1=Answers one question correctly or dysarthria/intubated/trauma/language barrier 2=Answers neither question correctly or aphasia 0  LOC Commands      -Open/Close eyes     -Open/close grip     -Pantomime commands if communication barrier 0=Performs both tasks correctly 1=Performs one task correctly 2=Performs neighter task correctly 0  Best Gaze     -Only assess horizontal gaze 0=Normal 1=Partial gaze palsy 2=Forced deviation, or total gaze paresis 2  Visual 0=No visual loss 1=Partial hemianopia 2=Complete hemianopia 3=Bilateral hemianopia (blind including cortical blindness) 2  Facial Palsy     -Use grimace if obtunded 0=Normal symmetrical movement 1=Minor paralysis (asymmetry) 2=Partial paralysis (lower face) 3=Complete paralysis (upper and lower face) 2  Motor  0=No drift for 10/5 seconds 1=Drift, but does not hit bed 2=Some antigravity effort, hits  bed 3=No effort against gravity, limb falls 4=No movement 0=Amputation/joint fusion Right Arm 0     Leg 0    Left Arm 4     Leg 4  Limb Ataxia     - FNT/HTS 0=Absent or does not understand or paralyzed or amputation/joint fusion 1=Present in one limb 2=Present in two limbs 0  Sensory 0=Normal 1=Mild to  moderate sensory loss 2=Severe to total sensory loss or coma/unresponsive 2  Best Language 0=No aphasia, normal 1=Mild to moderate aphasia 2=Severe aphasia 3=Mute, global aphasia, or coma/unresponsive 0  Dysarthria 0=Normal 1=Mild to moderate 2=Severe, unintelligible or mute/anarthric 0=intubated/unable to test 1  Extinction/Neglect 0=No abnormality 1=visual/tactile/auditory/spatia/personal inattention/Extinction to bilateral simultaneous stimulation 2=Profound neglect/extinction more than 1 modality  2  Total   20     Data Reviewed: CT ANGIO HEAD NECK W WO CM W PERF (CODE STROKE) Result Date: 07/26/2023 CLINICAL DATA:  Neuro deficit, concern for stroke, acute right MCA territory infarct on noncontrast CT. Left-sided weakness and slurred speech. EXAM: CT ANGIOGRAPHY HEAD AND NECK CT PERFUSION BRAIN TECHNIQUE: Multidetector CT imaging of the head and neck was performed using the standard protocol during bolus administration of intravenous contrast. Multiplanar CT image reconstructions and MIPs were obtained to evaluate the vascular anatomy. Carotid stenosis measurements (when applicable) are obtained utilizing NASCET criteria, using the distal internal carotid diameter as the denominator. Multiphase CT imaging of the brain was performed following IV bolus contrast injection. Subsequent parametric perfusion maps were calculated using RAPID software. RADIATION DOSE REDUCTION: This exam was performed according to the departmental dose-optimization program which includes automated exposure control, adjustment of the mA and/or kV according to patient size and/or use of iterative reconstruction technique. CONTRAST:  OMNIPAQUE  IOHEXOL  350 MG/ML SOLN COMPARISON:  Same-day head CT. FINDINGS: CTA NECK FINDINGS Aortic arch: Common origin of  the brachiocephalic and left common carotid arteries. Imaged portion shows no evidence of aneurysm or dissection. Mild atherosclerosis of the visualized aortic  arch. No significant stenosis of the major arch vessel origins. Pulmonary arteries: As permitted by contrast timing, there are no filling defects in the visualized pulmonary arteries. Subclavian arteries: The subclavian arteries are patent bilaterally. Right carotid system: No evidence of dissection, stenosis (50% or greater), or occlusion. Minimal atherosclerosis along the proximal cervical ICA. Left carotid system: No evidence of dissection, stenosis (50% or greater), or occlusion. Minimal atherosclerosis at the carotid bifurcation. Vertebral arteries: Codominant. No evidence of dissection, stenosis (50% or greater), or occlusion. The vertebral arteries are relatively small in caliber bilaterally. Slightly limited visualization of the distal V1 and proximal V2 segments of the left vertebral artery due to adjacent dense venous contrast. Distal tapering of the right V4 segment after the origin of the right PICA. Skeleton: No acute or aggressive finding noted. Other neck: The visualized airway is patent. No cervical lymphadenopathy. Upper chest: Large bilateral pleural effusions, left greater than right. Partially visualized right chest wall Port-A-Cath. Review of the MIP images confirms the above findings CTA HEAD FINDINGS ANTERIOR CIRCULATION: The intracranial internal carotid arteries are patent bilaterally. There is minimal atherosclerosis involving the bilateral carotid siphons without significant stenosis. MCAs: There is abrupt occlusion of the proximal/mid M1 segment of the right MCA with possible intraluminal filling defect concerning for thrombus. There are minimal diminutive caliber M2 and M3 inferior division branches of the right MCA which are likely perfused via collaterals. The left MCA is patent. ACAs: The anterior cerebral arteries are patent bilaterally. POSTERIOR CIRCULATION: No significant stenosis, proximal occlusion, aneurysm, or vascular malformation. PCAs: The PCAs are patent bilaterally and  are primarily supplied via the posterior communicating arteries. Diminutive P1 segments noted bilaterally. Pcomm: The posterior communicating arteries are visualized bilaterally. SCAs: The superior cerebellar arteries are patent bilaterally. Basilar artery: The basilar artery is patent but is diminutive in caliber with distal tapering. AICAs: Not well visualized. PICAs: Patent Vertebral arteries: The intracranial vertebral arteries are patent. Venous sinuses: As permitted by contrast timing, patent. Anatomic variants: None Review of the MIP images confirms the above findings CT Brain Perfusion Findings: ASPECTS: 7 CBF (<30%) Volume: 62mL Perfusion (Tmax>6.0s) volume: Mismatch Volume: 70mL Infarction Location:Core infarct throughout the right MCA territory. Surrounding region of elevated T-max with mismatch ratio of 2.1. IMPRESSION: Occlusion of the proximal/mid M1 segment right MCA with intraluminal filling defect concerning for thrombus. Few diminutive caliber M2 and M3 branches noted likely filled via collaterals. 62 mL region of core infarct in the right MCA territory with surrounding at risk tissue. Mismatch ratio of 2.1. Intracranial arterial vasculature is otherwise patent. Findings suggestive of congenital vertebrobasilar hypoplasia. Large bilateral pleural effusions, left greater than right. Aortic Atherosclerosis (ICD10-I70.0). The first 2 impression points were communicated to Dr. Jerri at 12:12 pm on 07/26/2023 by text page via the [facility] messaging system. Electronically Signed   By: Donnice Mania M.D.   On: 07/26/2023 12:23   CT HEAD CODE STROKE WO CONTRAST Result Date: 07/26/2023 CLINICAL DATA:  Code stroke. Neuro deficit, acute, stroke suspected. Left-sided weakness, rightward gaze, and slurred speech. EXAM: CT HEAD WITHOUT CONTRAST TECHNIQUE: Contiguous axial images were obtained from the base of the skull through the vertex without intravenous contrast. RADIATION DOSE REDUCTION: This exam was  performed according to the departmental dose-optimization program which includes automated exposure control, adjustment of the mA and/or kV according to patient size and/or use  of iterative reconstruction technique. COMPARISON:  None Available. FINDINGS: Brain: There is loss of gray-differentiation involving the right insula, a portion of the right lentiform nucleus, and likely a portion of the right temporal lobe consistent with an acute infarct. No intracranial hemorrhage, mass, midline shift, or extra-axial fluid collection is identified. Cerebral volume is normal. The ventricles are normal in size. Vascular: Hyperdense right MCA. Skull: No fracture or suspicious lesion. Sinuses/Orbits: The paranasal sinuses and mastoid air cells are well aerated. Bilateral cataract extraction. Other: None. ASPECTS ([place] Stroke Program Early CT Score) - Ganglionic level infarction (caudate, lentiform nuclei, internal capsule, insula, M1-M3 cortex): 4 - Supraganglionic infarction (M4-M6 cortex): 3 Total score (0-10 with 10 being normal): 7 These results were communicated to Dr. Jerri at 11:51 am on 07/26/2023 by text page via the [facility] messaging system. IMPRESSION: 1. Acute right MCA infarct.  ASPECTS of 7. 2. No intracranial hemorrhage. Electronically Signed   By: Dasie Hamburg M.D.   On: 07/26/2023 11:51    Assessment: 78 y.o. 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  NIHSS 20, last seen well 10 AM, she took Eliquis  this morning.  CT no bleeding but right brain hypoattenuation with right MCA hyperdense sign with ASPECT 7.  CTA head and neck showed right M1  occlusion, CTP 62/132cc.  Patient not a candidate for TNK given on Eliquis , given LVO, discussed with interventional radiology Dr. Ray and patient husband Hamburg over the phone, decision made to proceed with thrombectomy.   Plan: Continue ICU care post IR Continue further stroke work up  Frequent neuro checks Telemetry monitoring MRI brain and MRA head post IR Echocardiogram  Will check LE venous doppler Fasting lipid panel and HgbA1C PT/OT/speech consult GI and DVT prophylaxis  Hold off eliquis  for now, will decide post MRI based on infarct size Stroke risk factor modification  Thank you for this consultation and allowing us  to participate in the care of this patient.  Ary Jerri, MD PhD Stroke Neurology 07/26/2023 2:26 PM  This patient is critically ill due to R MCA infarct with R M1 occlusion, DVT on eliquis  and at significant risk of neurological worsening, death form large R MCA stroke, hemorrhagic conversion, PE, cerebral edema. This patient's care requires constant monitoring of vital signs, hemodynamics, respiratory and cardiac monitoring, review of multiple databases, neurological assessment, discussion with family, other specialists and medical decision making of high complexity. I spent 60 minutes of neurocritical care time in the care of this patient. I discussed with Dr. Ray

## 2023-07-26 NOTE — Code Documentation (Signed)
 Stroke Response Nurse Documentation Code Documentation  Rachel Carlson is a 78 y.o. female arriving to [facility]  via [facility] on 6/27 with past medical hx of migraines, colon CA, Htn, DVT. On Eliquis  (apixaban ) daily. Code stroke was activated by EMS.   Patient from home where she was LKW at 1000 and shortly thereafter, her husband found her having fallen in the bathroom with L sided weakness, slurred speech, and a forced gaze.   Stroke team at the bedside on patient arrival. Labs drawn and patient cleared for CT by EDP. Patient to CT with team. NIHSS 15, see documentation for details and code stroke times. Patient with right gaze preference , left hemianopia, left facial droop, left arm weakness, bilateral leg weakness, left decreased sensation, dysarthria , and left neglect on exam. The following imaging was completed:  CT Head, CTA, and CTP. Patient is not a candidate for IV Thrombolytic due to last dose of Eliquis  this morning. Patient is a candidate for IR due to M1 occlusion.   Care Plan: IR for thrombectomy, then 4N for post-thrombectomy monitoring.   Bedside handoff with IR RN Melynn.    [name]  Stroke Response RN

## 2023-07-26 NOTE — ED Triage Notes (Signed)
 Pt to ed from home with CC of slurred speech and left sided weakness with a LSN at 10 am. Pt is on Eliquis  and has taken a dose last night and this morning. Pt lives at home independently. Pt has had a recent cancer diagnosis.

## 2023-07-27 ENCOUNTER — Inpatient Hospital Stay (HOSPITAL_COMMUNITY)

## 2023-07-27 ENCOUNTER — Other Ambulatory Visit: Payer: Self-pay

## 2023-07-27 DIAGNOSIS — I63431 Cerebral infarction due to embolism of right posterior cerebral artery: Secondary | ICD-10-CM

## 2023-07-27 DIAGNOSIS — R29715 NIHSS score 15: Secondary | ICD-10-CM

## 2023-07-27 DIAGNOSIS — I63411 Cerebral infarction due to embolism of right middle cerebral artery: Secondary | ICD-10-CM | POA: Diagnosis not present

## 2023-07-27 DIAGNOSIS — I6389 Other cerebral infarction: Secondary | ICD-10-CM | POA: Diagnosis not present

## 2023-07-27 DIAGNOSIS — I69391 Dysphagia following cerebral infarction: Secondary | ICD-10-CM

## 2023-07-27 DIAGNOSIS — J9691 Respiratory failure, unspecified with hypoxia: Secondary | ICD-10-CM

## 2023-07-27 DIAGNOSIS — E785 Hyperlipidemia, unspecified: Secondary | ICD-10-CM

## 2023-07-27 DIAGNOSIS — G935 Compression of brain: Secondary | ICD-10-CM

## 2023-07-27 DIAGNOSIS — Z86718 Personal history of other venous thrombosis and embolism: Secondary | ICD-10-CM

## 2023-07-27 DIAGNOSIS — G936 Cerebral edema: Secondary | ICD-10-CM

## 2023-07-27 DIAGNOSIS — D6859 Other primary thrombophilia: Secondary | ICD-10-CM | POA: Diagnosis not present

## 2023-07-27 LAB — RAPID URINE DRUG SCREEN, HOSP PERFORMED
Amphetamines: NOT DETECTED
Barbiturates: NOT DETECTED
Benzodiazepines: NOT DETECTED
Cocaine: NOT DETECTED
Opiates: NOT DETECTED
Tetrahydrocannabinol: NOT DETECTED

## 2023-07-27 LAB — ECHOCARDIOGRAM COMPLETE
Area-P 1/2: 5.02 cm2
Height: 62 in
S' Lateral: 2.7 cm
Weight: 2592.61 [oz_av]

## 2023-07-27 LAB — TRIGLYCERIDES: Triglycerides: 120 mg/dL (ref <150)

## 2023-07-27 LAB — SODIUM
Sodium: 142 mmol/L (ref 135–145)
Sodium: 145 mmol/L (ref 135–145)

## 2023-07-27 LAB — HEPARIN LEVEL (UNFRACTIONATED): Heparin Unfractionated: 0.63 [IU]/mL (ref 0.30–0.70)

## 2023-07-27 LAB — APTT: aPTT: 49 s — ABNORMAL HIGH (ref 24–36)

## 2023-07-27 LAB — LIPID PANEL
Cholesterol: 136 mg/dL (ref 0–200)
HDL: 51 mg/dL (ref >40)
LDL Cholesterol: 61 mg/dL (ref 0–99)
Total CHOL/HDL Ratio: 2.7 ratio
Triglycerides: 121 mg/dL (ref <150)
VLDL: 24 mg/dL (ref 0–40)

## 2023-07-27 LAB — GLUCOSE, CAPILLARY: Glucose-Capillary: 105 mg/dL — ABNORMAL HIGH (ref 70–99)

## 2023-07-27 MED ORDER — SODIUM CHLORIDE 3 % IV BOLUS
250.0000 mL | Freq: Once | INTRAVENOUS | Status: AC
  Administered 2023-07-27: 250 mL via INTRAVENOUS
  Filled 2023-07-27: qty 500

## 2023-07-27 MED ORDER — CHLORHEXIDINE GLUCONATE CLOTH 2 % EX PADS
6.0000 | MEDICATED_PAD | Freq: Every day | CUTANEOUS | Status: DC
  Administered 2023-07-27 – 2023-08-08 (×14): 6 via TOPICAL

## 2023-07-27 MED ORDER — THIAMINE MONONITRATE 100 MG PO TABS: 100.0000 mg | ORAL_TABLET | Freq: Every day | ORAL | Status: DC

## 2023-07-27 MED ORDER — HEPARIN (PORCINE) 25000 UT/250ML-% IV SOLN
800.0000 [IU]/h | INTRAVENOUS | Status: DC
  Administered 2023-07-27: 800 [IU]/h via INTRAVENOUS
  Administered 2023-07-28: 950 [IU]/h via INTRAVENOUS
  Administered 2023-07-29: 900 [IU]/h via INTRAVENOUS
  Filled 2023-07-27 (×4): qty 250

## 2023-07-27 MED ORDER — SODIUM CHLORIDE 3 % IV SOLN
INTRAVENOUS | Status: DC
  Filled 2023-07-27 (×6): qty 500
  Filled 2023-07-27: qty 1000
  Filled 2023-07-27 (×2): qty 500

## 2023-07-27 MED ORDER — PROSOURCE TF20 ENFIT COMPATIBL EN LIQD
60.0000 mL | Freq: Every day | ENTERAL | Status: DC
  Administered 2023-07-27 – 2023-08-07 (×12): 60 mL
  Filled 2023-07-27 (×12): qty 60

## 2023-07-27 MED ORDER — ADULT MULTIVITAMIN W/MINERALS CH
1.0000 | ORAL_TABLET | Freq: Every day | ORAL | Status: DC
  Administered 2023-07-27 – 2023-08-07 (×12): 1
  Filled 2023-07-27 (×12): qty 1

## 2023-07-27 MED ORDER — OSMOLITE 1.2 CAL PO LIQD
1000.0000 mL | ORAL | Status: AC
  Administered 2023-07-27 – 2023-08-06 (×9): 1000 mL

## 2023-07-27 MED ORDER — THIAMINE MONONITRATE 100 MG PO TABS
100.0000 mg | ORAL_TABLET | Freq: Every day | ORAL | Status: AC
  Administered 2023-07-27 – 2023-07-31 (×5): 100 mg
  Filled 2023-07-27 (×5): qty 1

## 2023-07-27 NOTE — Progress Notes (Signed)
  Echocardiogram 2D Echocardiogram has been performed.  Rachel Carlson 07/27/2023, 5:37 PM

## 2023-07-27 NOTE — Progress Notes (Signed)
 PT Cancellation Note  Patient Details Name: Rachel Carlson MRN: [id number] DOB: 04/23/1945   Cancelled Treatment:    Reason Eval/Treat Not Completed: Medical issues which prohibited therapy. Pt remains intubated. PT will follow up as time allows.   [name] 07/27/2023, 2:12 PM

## 2023-07-27 NOTE — Progress Notes (Addendum)
 STROKE TEAM PROGRESS NOTE   SIGNIFICANT HOSPITAL EVENTS 6/27-   INTERIM HISTORY/SUBJECTIVE Remains intubated Family planning on coming later today.  Met with son and husband after a.m. rounds CT head with large right MCA infarct with significant cytotoxic edema.  Start hypertonic saline and IV heparin .  She does have a known DVT in her left lower extremity  OBJECTIVE  CBC    Component Value Date/Time   WBC 5.0 07/26/2023 1134   RBC 3.05 (L) 07/26/2023 1134   HGB 9.2 (L) 07/26/2023 1524   HGB 11.0 (L) 04/26/2023 1043   HCT 27.0 (L) 07/26/2023 1524   PLT 164 07/26/2023 1134   PLT 199 04/26/2023 1043   MCV 95.4 07/26/2023 1134   MCH 32.1 07/26/2023 1134   MCHC 33.7 07/26/2023 1134   RDW 15.3 07/26/2023 1134   LYMPHSABS 0.2 (L) 07/26/2023 1134   MONOABS 0.1 07/26/2023 1134   EOSABS 0.0 07/26/2023 1134   BASOSABS 0.0 07/26/2023 1134    BMET    Component Value Date/Time   NA 138 07/26/2023 1524   K 3.5 07/26/2023 1524   CL 101 07/26/2023 1140   CO2 20 (L) 07/26/2023 1134   GLUCOSE 136 (H) 07/26/2023 1140   BUN 15 07/26/2023 1140   CREATININE 0.60 07/26/2023 1140   CREATININE 0.64 04/26/2023 1043   CALCIUM  8.5 (L) 07/26/2023 1134   GFRNONAA >60 07/26/2023 1134   GFRNONAA >60 04/26/2023 1043    IMAGING past 24 hours US  EKG SITE RITE Result Date: 07/27/2023 If Site Rite image not attached, placement could not be confirmed due to current cardiac rhythm.  CT HEAD WO CONTRAST ( ) Result Date: 07/27/2023 CLINICAL DATA:  78 year old female status post code stroke presentation yesterday with right MCA infarct. Subsequent encounter. EXAM: CT HEAD WITHOUT CONTRAST TECHNIQUE: Contiguous axial images were obtained from the base of the skull through the vertex without intravenous contrast. RADIATION DOSE REDUCTION: This exam was performed according to the departmental dose-optimization program which includes automated exposure control, adjustment of the mA and/or kV according to  patient size and/or use of iterative reconstruction technique. COMPARISON:  Head CT 1144 hours yesterday. FINDINGS: Brain: Confluent right hemisphere cytotoxic edema has developed throughout most of the right MCA and right PCA vascular territories (series 3, image 19). No malignant hemorrhagic transformation. Mild mass effect on the right lateral ventricle with no midline shift. Basilar cisterns remain patent. Stable gray-white differentiation elsewhere. Vascular: Stable. Skull: Stable and intact. Sinuses/Orbits: Visualized paranasal sinuses and mastoids are stable and well aerated. Other: No acute orbit or scalp soft tissue finding. IMPRESSION: Confluent Right hemisphere cytotoxic edema has developed in both the Right MCA and Right PCA vascular territories. No malignant hemorrhagic transformation. Mild mass effect on the right lateral ventricle with no midline shift. Electronically Signed   By: [name]   On: 07/27/2023 10:00   DG CHEST PORT 1 VIEW Result Date: 07/26/2023 CLINICAL DATA:  Intubation and enteric tube placement EXAM: PORTABLE CHEST 1 VIEW COMPARISON:  05/30/2023 FINDINGS: Endotracheal tube tip in the intrathoracic trachea 4.5 cm from the carina. Enteric tube tip and side-port in the stomach. Right chest wall Port-A-Cath tip in the right atrium. Large left and small right pleural effusions. Bilateral airspace opacities in the mid and lower lungs. No pneumothorax. IMPRESSION: 1. Endotracheal tube tip in the intrathoracic trachea 4.5 cm from the carina. 2. Enteric tube tip and side-port in the stomach. 3. Large left and small right pleural effusions. 4. Bilateral airspace opacities in the mid  and lower lungs may be due to edema or infection. Electronically Signed   By: Norman Gatlin M.D.   On: 07/26/2023 22:12   VAS US  LOWER EXTREMITY VENOUS (DVT) Result Date: 07/26/2023  Lower Venous DVT Study Patient Name:  KAMERIA CANIZARES  Date of Exam:   07/26/2023 Medical Rec #: [id number]             Accession #:    [id number] Date of Birth: July 08, 1945           Patient Gender: F Patient Age:   78 years Exam Location:  [facility] Procedure:      VAS US  LOWER EXTREMITY VENOUS (DVT) Referring Phys: ARY XU --------------------------------------------------------------------------------  Indications: Stroke.  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 Anticoagulation: Eliquis . Limitations: Bandages. Comparison Study: Previous exam on 07/24/2022 was positive for IM thrombus of                   left gastrocs Performing Technologist: Ezzie Potters RVT, RDMS  Examination Guidelines: A complete evaluation includes B-mode imaging, spectral Doppler, color Doppler, and power Doppler as needed of all accessible portions of each vessel. Bilateral testing is considered an integral part of a complete examination. Limited examinations for reoccurring indications may be performed as noted. The reflux portion of the exam is performed with the patient in reverse Trendelenburg.  +---------+---------------+---------+-----------+----------+-------------------+ RIGHT    CompressibilityPhasicitySpontaneityPropertiesThrombus Aging      +---------+---------------+---------+-----------+----------+-------------------+ CFV      Full           Yes      Yes                                      +---------+---------------+---------+-----------+----------+-------------------+ SFJ      Full                                                             +---------+---------------+---------+-----------+----------+-------------------+ FV Prox                 Yes      Yes                  Not well visualized +---------+---------------+---------+-----------+----------+-------------------+ FV Mid   Full           Yes      Yes                                      +---------+---------------+---------+-----------+----------+-------------------+ FV DistalFull           Yes      Yes                                       +---------+---------------+---------+-----------+----------+-------------------+ PFV                                                   unable to visualize +---------+---------------+---------+-----------+----------+-------------------+ POP  Full           Yes      Yes                                      +---------+---------------+---------+-----------+----------+-------------------+ PTV      Full                                                             +---------+---------------+---------+-----------+----------+-------------------+ PERO     Full                                                             +---------+---------------+---------+-----------+----------+-------------------+   +---------+---------------+---------+-----------+----------+--------------+ LEFT     CompressibilityPhasicitySpontaneityPropertiesThrombus Aging +---------+---------------+---------+-----------+----------+--------------+ CFV      Full           Yes      Yes                                 +---------+---------------+---------+-----------+----------+--------------+ SFJ      Full                                                        +---------+---------------+---------+-----------+----------+--------------+ FV Prox  Full           Yes      Yes                                 +---------+---------------+---------+-----------+----------+--------------+ FV Mid   Full           Yes      Yes                                 +---------+---------------+---------+-----------+----------+--------------+ FV DistalFull           Yes      Yes                                 +---------+---------------+---------+-----------+----------+--------------+ PFV      Full                                                        +---------+---------------+---------+-----------+----------+--------------+ POP      Full           Yes      Yes                                  +---------+---------------+---------+-----------+----------+--------------+  PTV      Full                                                        +---------+---------------+---------+-----------+----------+--------------+ PERO     Full                                                        +---------+---------------+---------+-----------+----------+--------------+     Summary: BILATERAL: - No evidence of deep vein thrombosis seen in the lower extremities, bilaterally. -No evidence of popliteal cyst, bilaterally.   *See table(s) above for measurements and observations.    Preliminary    IR PERCUTANEOUS ART THROMBECTOMY/INFUSION INTRACRANIAL INC DIAG ANGIO Result Date: 07/26/2023 PROCEDURE PERFORMED: 1. Stroke thrombectomy 2. Ultrasound vascular access 3. Cone beam CT for treatment planning COMPARISON:  CT perfusion performed May 07, 2023. CLINICAL DATA:  78 year old female with acute ischemic stroke with right MCA syndrome in CT imaging compatible with the right M1 occlusion. Pre treatment aspects score measured 7. NIH stroke scale was measured at 20. Patient was anticoagulated and not a candidate for thrombo lytic therapy. After multidisciplinary consensus conference, the patient's husband was offered stroke thrombectomy for his wife and provided informed consent. INDICATION: Acute ischemic stroke. ANESTHESIA/SEDATION: General anesthesia was utilized for the procedure. CONTRAST:  Approximately 70 cc Omnipaque  300 MEDICATIONS: None. FLUOROSCOPY TIME:  Fluoroscopy Time: 23 minutes (873 mGy). COMPLICATIONS: None immediate. BODY OF REPORT: Following a full explanation of the procedure along with the potential associated complications, an informed witnessed consent was obtained. The patient was then placed under general anesthesia by the Department of Anesthesiology at [facility]. The right groin was prepped and draped in the usual sterile fashion. Ultrasound was used to study  the right common femoral artery which was patent. Using real-time ultrasound guidance, a 21 gauge introducer needle was used to access the right common femoral artery. Access was performed at 1241 p.m. A hard copy image ultrasound the same date uncertain PACS. Using this access, a 6 French sheath was placed in the descending thoracic aorta. Next, selective catheterization the right internal carotid artery was performed. A selective arteriogram was performed which demonstrated that the internal carotid artery was patent. A right M1 occlusion was identified, pre treatment TICI score = 0. Next, a penumbra 43 and red 62 catheter was used to selectively catheterize the right M1 segment. Selective catheterization was achieved at 1302. Suction thrombectomy was performed at this site which did not yield significant thrombus. Next, a second pass was performed using the same system at 13:08. This also did not yield significant thrombus. I changed approach at this point and I deployed in [employer] trap thrombectomy device in the right M1 segment at 13:13. This did not yield significant thrombus and therefore a second attempt was performed at 13:22 using this device. As this was unsuccessful I elected to transition to the [facility] device. Selective catheterization of the MCA was performed and a solitaire 6 mm device was deployed at 13:29. This did not yield significant thrombus in therefore this was deployed again at 13:37 as well as at 13:44. After reviewing the available imaging and repeating a arteriogram with no  significant progress I elected to terminate the procedure due to increasing risk of complication and decreasing chance of technical success. Post treatment TICI score = 0. Evaluation of the right femoral access site demonstrated at this site was suitable for closure device. A 6 French Angio-Seal device was deployed without complication. Cone beam CT was then performed to evaluate for intracranial hemorrhage and  treatment planning. This demonstrated no evidence significant acute intracranial hemorrhage. The patient was removed from anesthesia and transferred to recovery in stable condition. IMPRESSION: 1. Unsuccessful right MCA thrombectomy (TICI=0). PLAN: 1. To ICU for postoperative supportive care. Electronically Signed   By: Maude Naegeli M.D.   On: 07/26/2023 15:04   IR US  Guide Vasc Access Right Result Date: 07/26/2023 PROCEDURE PERFORMED: 1. Stroke thrombectomy 2. Ultrasound vascular access 3. Cone beam CT for treatment planning COMPARISON:  CT perfusion performed May 07, 2023. CLINICAL DATA:  78 year old female with acute ischemic stroke with right MCA syndrome in CT imaging compatible with the right M1 occlusion. Pre treatment aspects score measured 7. NIH stroke scale was measured at 20. Patient was anticoagulated and not a candidate for thrombo lytic therapy. After multidisciplinary consensus conference, the patient's husband was offered stroke thrombectomy for his wife and provided informed consent. INDICATION: Acute ischemic stroke. ANESTHESIA/SEDATION: General anesthesia was utilized for the procedure. CONTRAST:  Approximately 70 cc Omnipaque  300 MEDICATIONS: None. FLUOROSCOPY TIME:  Fluoroscopy Time: 23 minutes (873 mGy). COMPLICATIONS: None immediate. BODY OF REPORT: Following a full explanation of the procedure along with the potential associated complications, an informed witnessed consent was obtained. The patient was then placed under general anesthesia by the Department of Anesthesiology at [facility]. The right groin was prepped and draped in the usual sterile fashion. Ultrasound was used to study the right common femoral artery which was patent. Using real-time ultrasound guidance, a 21 gauge introducer needle was used to access the right common femoral artery. Access was performed at 1241 p.m. A hard copy image ultrasound the same date uncertain PACS. Using this access, a 6 French sheath  was placed in the descending thoracic aorta. Next, selective catheterization the right internal carotid artery was performed. A selective arteriogram was performed which demonstrated that the internal carotid artery was patent. A right M1 occlusion was identified, pre treatment TICI score = 0. Next, a penumbra 43 and red 62 catheter was used to selectively catheterize the right M1 segment. Selective catheterization was achieved at 1302. Suction thrombectomy was performed at this site which did not yield significant thrombus. Next, a second pass was performed using the same system at 13:08. This also did not yield significant thrombus. I changed approach at this point and I deployed in [employer]MB[employer] trap thrombectomy device in the right M1 segment at 13:13. This did not yield significant thrombus and therefore a second attempt was performed at 13:22 using this device. As this was unsuccessful I elected to transition to the [facility] device. Selective catheterization of the MCA was performed and a solitaire 6 mm device was deployed at 13:29. This did not yield significant thrombus in therefore this was deployed again at 13:37 as well as at 13:44. After reviewing the available imaging and repeating a arteriogram with no significant progress I elected to terminate the procedure due to increasing risk of complication and decreasing chance of technical success. Post treatment TICI score = 0. Evaluation of the right femoral access site demonstrated at this site was suitable for closure device. A 6 French Angio-Seal device was deployed without  complication. Cone beam CT was then performed to evaluate for intracranial hemorrhage and treatment planning. This demonstrated no evidence significant acute intracranial hemorrhage. The patient was removed from anesthesia and transferred to recovery in stable condition. IMPRESSION: 1. Unsuccessful right MCA thrombectomy (TICI=0). PLAN: 1. To ICU for postoperative supportive care.  Electronically Signed   By: Maude Naegeli M.D.   On: 07/26/2023 15:04   IR CT Head Ltd Result Date: 07/26/2023 PROCEDURE PERFORMED: 1. Stroke thrombectomy 2. Ultrasound vascular access 3. Cone beam CT for treatment planning COMPARISON:  CT perfusion performed May 07, 2023. CLINICAL DATA:  78 year old female with acute ischemic stroke with right MCA syndrome in CT imaging compatible with the right M1 occlusion. Pre treatment aspects score measured 7. NIH stroke scale was measured at 20. Patient was anticoagulated and not a candidate for thrombo lytic therapy. After multidisciplinary consensus conference, the patient's husband was offered stroke thrombectomy for his wife and provided informed consent. INDICATION: Acute ischemic stroke. ANESTHESIA/SEDATION: General anesthesia was utilized for the procedure. CONTRAST:  Approximately 70 cc Omnipaque  300 MEDICATIONS: None. FLUOROSCOPY TIME:  Fluoroscopy Time: 23 minutes (873 mGy). COMPLICATIONS: None immediate. BODY OF REPORT: Following a full explanation of the procedure along with the potential associated complications, an informed witnessed consent was obtained. The patient was then placed under general anesthesia by the Department of Anesthesiology at [facility]. The right groin was prepped and draped in the usual sterile fashion. Ultrasound was used to study the right common femoral artery which was patent. Using real-time ultrasound guidance, a 21 gauge introducer needle was used to access the right common femoral artery. Access was performed at 1241 p.m. A hard copy image ultrasound the same date uncertain PACS. Using this access, a 6 French sheath was placed in the descending thoracic aorta. Next, selective catheterization the right internal carotid artery was performed. A selective arteriogram was performed which demonstrated that the internal carotid artery was patent. A right M1 occlusion was identified, pre treatment TICI score = 0. Next, a  penumbra 43 and red 62 catheter was used to selectively catheterize the right M1 segment. Selective catheterization was achieved at 1302. Suction thrombectomy was performed at this site which did not yield significant thrombus. Next, a second pass was performed using the same system at 13:08. This also did not yield significant thrombus. I changed approach at this point and I deployed in EMB[employer] trap thrombectomy device in the right M1 segment at 13:13. This did not yield significant thrombus and therefore a second attempt was performed at 13:22 using this device. As this was unsuccessful I elected to transition to the [facility] device. Selective catheterization of the MCA was performed and a solitaire 6 mm device was deployed at 13:29. This did not yield significant thrombus in therefore this was deployed again at 13:37 as well as at 13:44. After reviewing the available imaging and repeating a arteriogram with no significant progress I elected to terminate the procedure due to increasing risk of complication and decreasing chance of technical success. Post treatment TICI score = 0. Evaluation of the right femoral access site demonstrated at this site was suitable for closure device. A 6 French Angio-Seal device was deployed without complication. Cone beam CT was then performed to evaluate for intracranial hemorrhage and treatment planning. This demonstrated no evidence significant acute intracranial hemorrhage. The patient was removed from anesthesia and transferred to recovery in stable condition. IMPRESSION: 1. Unsuccessful right MCA thrombectomy (TICI=0). PLAN: 1. To ICU for postoperative supportive care. Electronically  Signed   By: Maude Naegeli M.D.   On: 07/26/2023 15:04    Vitals:   07/27/23 0700 07/27/23 0751 07/27/23 0800 07/27/23 1152  BP: 124/71     Pulse: 67     Resp: (!) 21     Temp:   99.1 F (37.3 C) 98.4 F (36.9 C)  TempSrc:   Axillary Axillary  SpO2: 97% 98%    Weight:      Height:          PHYSICAL EXAM General: Intubated well-nourished, well-developed elderly Caucasian lady in no acute distress Psych:  Mood and affect appropriate for situation CV: Regular rate and rhythm on monitor Respiratory:  Regular, unlabored respirations on room air GI: Abdomen soft and nontender   NEURO:  Mental Status: Patient is sedated and intubated.  She opens eyes to stimulation and follows some commands on the right side and midline.  Cranial Nerves:  II: PERRL. Does not blink to threat on the left III, IV, VI: Moves slightly past midline  V: Sensation is intact to light touch and symmetrical to face.  VII: Face is symmetrical resting and smiling VIII: hearing intact to voice. IX, X: Palate elevates symmetrically. Phonation is normal.  KP:Dynloizm shrug 5/5. XII: tongue is midline without fasciculations. Motor: Follows commands on the right upper and lower extremity  Responds to pain on the left Tone: is normal and bulk is normal Sensation- Intact to light touch bilaterally. Extinction absent to light touch to DSS.   Coordination: FTN intact bilaterally, HKS: no ataxia in BLE.No drift.  Gait- deferred  NIH Stroke Scale: 15    ASSESSMENT/PLAN  [name] is a 78 y.o. 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 pfo Code Stroke CT head- Acute right MCA infarct.  ASPECTS of 7. No intracranial hemorrhage. CTA head & neck - Occlusion of the proximal/mid M1 segment right MCA with intraluminal filling  defect concerning for thrombus. Few diminutive caliber M2 and M3 branches noted likely filled via collaterals. CT perfusion Core 62 Penumbra 132  Repeat head CT - Confluent Right hemisphere cytotoxic edema has developed in both the right MCA and Right PCA vascular territories. No malignant hemorrhagic transformation. Mild mass effect on the right lateral ventricle with no midline shift. MRI  Pending  2D Echo Pending  LDL 61 HgbA1c 6.3 VTE prophylaxis - pending Eliquis  (apixaban ) daily prior to admission, now on heparin  IV  Therapy recommendations:  Pending Disposition:  Pending   Cerebral cytotoxic edema with brain compression HTS at 78ml/hr 6/28 250cc bolus Na 138 Na q 6 per protocol   Procedural intubation  Hypoxemic respiratory failure Hx of malignant pleural effusion PCCM consulted SBT/extubation per CCM CXR: Large left and small right pleural effusions.   Hypertension Stable Blood Pressure Goal: BP less than 180/105   Hyperlipidemia Home meds:  Crestor  20mg , resumed in hospital LDL 61, goal < 70 Continue statin at discharge  Dysphagia Patient has post-stroke dysphagia, SLP consulted    Diet   Diet NPO time specified   Advance diet  as tolerated  Peritoneal carcinomatosis undergoing chemo Follows with Dr. Katragadda at the [facility] Current protocol: niraparib  200 mg daily.  Gyn Onc-  Dr. Viktoria s/p omenectomy small bowel resection, s/p colostomy with colostomy prolapse   Hx left gastrocnemius/calf vein distal DVT Eliquis  5mg  BID No evidence of DVT  Hospital day # 1  Patient seen and examined by NP/APP with MD. MD to update note as needed.   Jorene Last, DNP, FNP-BC [facility] Pager: [phone]  I have personally obtained history,examined this patient, reviewed notes, independently viewed imaging studies, participated in medical decision making and plan of care.ROS completed by me personally and pertinent positives fully  documented  I have made any additions or clarifications directly to the above note. Agree with note above.  Patient with known history of ovarian cancer and peritoneal carcinomatosis on chemotherapy presented with sudden onset of left hemiplegia with right gaze deviation and left neglect due to right M1 occlusion.  Patient unfortunately was not able to be reviewed vascularize with mechanical thrombectomy despite multiple attempts.  Follow-up CT scan shows very large right hemispheric infarct with cytotoxic edema and early right-to-left midline shift.  Prognosis is quite poor and she is likely going to deteriorate and get worse with worsening edema.  Continue ventilatory support and start hypertonic saline with serum sodium goal 150-155.  Patient is not a candidate for hemicraniectomy given her poor baseline status from her malignancy.  Long discussion at bedside with the patient's husband as well as son regarding her prognosis and plan of care and answered questions.  Family is realistic they would like to continue aggressive medical treatment for now.  Plan to repeat CT head tomorrow and MRI later today.  Discussed with Dr. Neda critical care medicine This patient is critically ill and at significant risk of neurological worsening, death and care requires constant monitoring of vital signs, hemodynamics,respiratory and cardiac monitoring, extensive review of multiple databases, frequent neurological assessment, discussion with family, other specialists and medical decision making of high complexity.I have made any additions or clarifications directly to the above note.This critical care time does not reflect procedure time, or teaching time or supervisory time of PA/NP/Med Resident etc but could involve care discussion time.  I spent 60 minutes of neurocritical care time  in the care of  this patient.      Eather Popp, MD Medical Director [facility] Pager: [phone] 07/27/2023 2:09  PM   To contact Stroke Continuity provider, please refer to [email or web address]. After hours, contact General Neurology

## 2023-07-27 NOTE — Progress Notes (Signed)
 SLP Cancellation Note  Patient Details Name: Rachel Carlson MRN: [id number] DOB: Sep 03, 1945   Cancelled treatment:       Reason Eval/Treat Not Completed: Patient not medically ready;Other (comment) (Patient remains intubated. SLP will follow for readiness.)  Norleen IVAR Blase, MA, CCC-SLP Speech Therapy

## 2023-07-27 NOTE — Progress Notes (Signed)
 NAME:  Rachel Carlson, MRN:  [id number], DOB:  12-18-1945, LOS: 1 ADMISSION DATE:  07/26/2023, CONSULTATION DATE: 07/26/2023 REFERRING MD: Dr. 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 LKW 10:00. Was found sometime after than on the toilet, had falled couldn't get up and had L sided weakness, R gaze, L facial droop.  Imaging in ED w M1 occlusion. In ED was lethargic but able to state that she took eliquis  this morning.   She went for thrombectomy w NIR, unsuccessful revasc.  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)    Esophageal stricture    Family history of breast cancer    Family history of colon cancer    Family history of melanoma    Family history of prostate cancer    Fibroids 1998   uterine, history of (left ovaries)   GERD (gastroesophageal reflux disease)    History of hiatal hernia    History of uterine prolapse    Hx of adenomatous polyp of colon 10/19/2014   Hyperlipidemia    Hypertension    borderline no meds diet and exercise   Nausea and vomiting 05/19/2023   Neuromuscular disorder (HCC)    neuropathy from chemo   Obesity    Ovarian cancer (HCC)    Peritoneal carcinomatosis (HCC)    PVC (premature ventricular contraction)    first dx by holter in 1980's, echo (6/10) EF 55-60%, normal diastolic fxn, normal size RV and fxn, mild MR, PASP    Squamous cell carcinoma of arm    ovarian   Tubal pregnancy    Ulcerative proctosigmoiditis (HCC)    Urge urinary incontinence 06/03/2007   Qualifier: Diagnosis of  By: Cecilie CMA, NANNIE Mitzie Iline Lionel Events: Including procedures, antibiotic start and stop dates in addition to other pertinent events   M1 occlusion, unsuccessful thrombectomy 6/28-on ventilator, weaning well  Interim History / Subjective:  Sedated Weaning  Objective    Blood pressure 124/71, pulse 67, temperature 99.1 F (37.3 C), temperature source Axillary, resp. rate (!) 21, height 5' 2 (1.575 m), weight 73.5 kg, last menstrual period 05/29/1996, SpO2 98%.    Vent Mode: PSV;CPAP FiO2 (%):  [40 %-100 %] 40 % Set Rate:  [18 bmp] 18 bmp Vt Set:  [400 mL] 400 mL PEEP:  [5 cmH20] 5 cmH20 Pressure Support:  [5 cmH20] 5 cmH20 Plateau Pressure:  [15 cmH20] 15 cmH20   Intake/Output Summary (Last 24 hours) at 07/27/2023 1048 Last data filed at 07/27/2023 0700 Gross per 24 hour  Intake 1708.48 ml  Output 2150 ml  Net -441.52 ml   Filed Weights   07/26/23 1100 07/26/23 1243 07/27/23 0532  Weight: 74.2 kg 74 kg 73.5 kg    Examination: General: Elderly, does not appear to be in distress HENT: Endotracheal tube in place Lungs: Clear breath  sounds  Cardiovascular: S1-S2 appreciated Abdomen: Soft, bowel sounds appreciated Extremities: No clubbing, no edema Neuro: Alert, oriented x 3 GU:   Resolved problem list   Assessment and Plan   M1 occlusion with unsuccessful thrombectomy - Repeat CT this morning shows cytotoxic edema  Uterine cancer with peritoneal carcinomatosis  History of malignant pleural effusion  Hypoxemic respiratory failure  At present continue full vent support - Weaning as tolerated  Discussed with family at bedside regarding being able to take her off the ventilator They still desire ongoing aggressive care  Best Practice (right click and Reselect all  SmartList Selections daily)   Diet/type: NPO DVT prophylaxis SCD Pressure ulcer(s): N/A GI prophylaxis: N/A Lines: N/A Foley:  N/A Code Status:  full code Last date of multidisciplinary goals of care discussion [spoke with son and spouse at bedside]  Labs   CBC: Recent Labs  Lab 07/23/23 1230 07/26/23 1134 07/26/23 1140 07/26/23 1524  WBC 4.6 5.0  --   --   NEUTROABS 2.9 4.6  --   --   HGB 12.7 9.8* 11.9* 9.2*  HCT 37.6 29.1* 35.0* 27.0*  MCV 93.3 95.4  --   --   PLT 212.0 164  --   --     Basic Metabolic Panel: Recent Labs  Lab 07/23/23 1230 07/26/23 1134 07/26/23 1140 07/26/23 1524  NA 135 136 137 138  K 4.2 3.6 3.7 3.5  CL 100 105 101  --   CO2 28 20*  --   --   GLUCOSE 108* 161* 136*  --   BUN 15 9 15   --   CREATININE 0.76 0.50 0.60  --   CALCIUM  9.8 8.5*  --   --    GFR: Estimated Creatinine Clearance: 57.1 mL/min (by C-G formula based on SCr of 0.6 mg/dL). Recent Labs  Lab 07/23/23 1230 07/26/23 1134  WBC 4.6 5.0    Liver Function Tests: Recent Labs  Lab 07/26/23 1134  AST 16  ALT 11  ALKPHOS 58  BILITOT 0.6  PROT 4.4*  ALBUMIN  3.1*   No results for input(s): LIPASE, AMYLASE in the last 168 hours. No results for input(s): AMMONIA in the last 168 hours.  ABG    Component Value Date/Time   PHART 7.359 07/26/2023 1524   PCO2ART 38.0 07/26/2023 1524   PO2ART 163 (H) 07/26/2023 1524   HCO3 21.4 07/26/2023 1524   TCO2 23 07/26/2023 1524   ACIDBASEDEF 4.0 (H) 07/26/2023 1524   O2SAT 99 07/26/2023 1524     Coagulation Profile: Recent Labs  Lab 07/26/23 1134  INR 1.2    Cardiac Enzymes: No results for input(s): CKTOTAL, CKMB, CKMBINDEX, TROPONINI in the last 168 hours.  HbA1C: Hgb A1c MFr Bld  Date/Time Value Ref Range Status  07/23/2023 12:30 PM 6.3 4.6 - 6.5 % Final    Comment:    Glycemic Control Guidelines for People with Diabetes:Non Diabetic:  <6%Goal of Therapy: <7%Additional Action Suggested:  >8%    08/13/2022 08:43 AM 6.3 4.6 - 6.5 % Final    Comment:    Glycemic Control Guidelines for People with Diabetes:Non Diabetic:  <6%Goal of Therapy: <7%Additional Action Suggested:  >8%     CBG: Recent Labs  Lab 07/26/23 1135  GLUCAP 144*    Review of Systems:   Unable to provide history  Past Medical History:  She,  has a past medical history of Blood transfusion without reported diagnosis, Chronic right ear pain, Diverticulosis, DVT of leg (deep venous thrombosis) (HCC),  Esophageal stricture, Family history of breast cancer, Family history of colon cancer, Family history of melanoma, Family history of prostate cancer, Fibroids (1998), GERD (gastroesophageal reflux disease), History of hiatal hernia, History of uterine prolapse, adenomatous polyp of colon (10/19/2014), Hyperlipidemia, Hypertension, Nausea and vomiting (05/19/2023), Neuromuscular disorder (HCC), Obesity, Ovarian cancer (HCC), Peritoneal carcinomatosis (HCC), PVC (premature ventricular contraction), Squamous cell carcinoma of arm, Tubal pregnancy, Ulcerative proctosigmoiditis (HCC), and Urge urinary incontinence (06/03/2007).   Surgical History:   Past Surgical History:  Procedure Laterality Date   BILATERAL SALPINGECTOMY Bilateral 12/12/2022   Procedure: OPEN BILATERAL SALPINGECTOMY-OOPHORECTOMY;  Surgeon: Viktoria Comer SAUNDERS, MD;  Location: [facility];  Service: Gynecology;  Laterality: Bilateral;   BLADDER NECK SUSPENSION  1998   BREAST BIOPSY Left    BREAST LUMPECTOMY Left    benign   CATARACT EXTRACTION Right 01/2020   CATARACT EXTRACTION W/PHACO Left 08/12/2020   Procedure: CATARACT EXTRACTION PHACO AND INTRAOCULAR LENS PLACEMENT LEFT EYE;  Surgeon: Harrie Agent, MD;  Location: [facility];  Service: Ophthalmology;  Laterality: Left;  left CDE=10.71   COLONOSCOPY  05/18/2011/10/12/14   Dr. Lupita Commander   DEBULKING N/A 12/12/2022   Procedure: RADICAL TUMOR DEBULKING, PERITONEAL STRIPPING, MOBILIZATION SPLENIC FLEXURE,  RECTOSIGMOID RESECTION,SMALL BOWEL RESECTION AND REANASTOMOSIS ,END TO END DESCENDING COLOSTOMY;  Surgeon: Viktoria Comer SAUNDERS, MD;  Location: [facility];  Service: Gynecology;  Laterality: N/A;   ECTOPIC PREGNANCY SURGERY  1980   ESOPHAGOGASTRODUODENOSCOPY (EGD) WITH ESOPHAGEAL DILATION  04/03/2012   Dr. Lupita Commander   INCISION AND DRAINAGE ABSCESS N/A 01/02/2023   Procedure: INCISION AND DRAINAGE OF ABDOMINAL WOUND SEROMA;  Surgeon: Rogelio Planas, MD;  Location: [facility];  Service: Gynecology;  Laterality: N/A;   IR CT HEAD LTD  07/26/2023   IR IMAGING GUIDED PORT INSERTION  09/06/2022   IR PERCUTANEOUS ART THROMBECTOMY/INFUSION INTRACRANIAL INC DIAG ANGIO  07/26/2023   IR US  GUIDE VASC ACCESS RIGHT  07/26/2023   OMENTECTOMY N/A 12/12/2022   Procedure: OMENTECTOMY;  Surgeon: Viktoria Comer SAUNDERS, MD;  Location: [facility];  Service: Gynecology;  Laterality: N/A;   PARTIAL HYSTERECTOMY     abdominal, fibroids, and prolapse (1998) bladder tack   SALPINGOOPHORECTOMY Bilateral 12/12/2022   Procedure: DIAGNOSTIC LAPAROSCOPY;  Surgeon: Viktoria Comer SAUNDERS, MD;  Location: [facility];  Service: Gynecology;  Laterality: Bilateral;   SKIN CANCER EXCISION     UPPER GASTROINTESTINAL ENDOSCOPY       Social History:   reports that she has never smoked. She has never used smokeless tobacco. She reports that she does not drink alcohol  and does not use drugs.   Family History:  Her family history includes Arrhythmia in her brother; Barrett's esophagus in her maternal aunt and mother; Breast cancer in her sister; Colon cancer in her cousin and another family member; Colon cancer (age of onset: 15) in her son; Colon cancer (age of onset: 36) in her brother; Colon polyps in her sister; Congestive Heart Failure in her maternal grandmother; Coronary artery disease in her mother; Diabetes in her mother; Diabetes type II in her mother; Ehlers-Danlos syndrome in her daughter; Heart attack (age of onset: 20) in her mother;  Melanoma in her maternal uncle; Other in her son; Prostate cancer in her maternal grandfather; Skin cancer in her maternal aunt and mother; Sudden death in her mother; Sudden death (age of onset: 82) in her father; Transient ischemic attack in her mother; Tuberculosis in her paternal grandfather. There is no history of Esophageal cancer, Stomach cancer, Rectal cancer, Uterine cancer, or Bladder  Cancer.   Allergies Allergies  Allergen Reactions   Aspartame Other (See Comments)    Intolerance to artifical sweeteners, pt states caused burning of bladder   Levaquin  [Levofloxacin ] Nausea Only   Pantoprazole  Sodium Diarrhea    Abdominal pain   Ranitidine Diarrhea and Other (See Comments)    Abdominal pain, also   Omeprazole  Diarrhea and Other (See Comments)    Abdominal pain, also   Sulfasalazine  Rash and Other (See Comments)    Fever, chills, headache, muscle pain also    The patient is critically ill with multiple organ systems failure and requires high complexity decision making for assessment and support, frequent evaluation and titration of therapies, application of advanced monitoring technologies and extensive interpretation of multiple databases. Critical Care Time devoted to patient care services described in this note independent of APP/resident time (if applicable)  is 32 minutes.   Jennet Epley MD [facility] Personal pager: See Amion If unanswered, please page CCM On-call: [phone]

## 2023-07-27 NOTE — Progress Notes (Signed)
 PHARMACY - ANTICOAGULATION CONSULT NOTE  Pharmacy Consult:  Heparin  Indication:  History of DVT, new CVA  Allergies  Allergen Reactions   Aspartame Other (See Comments)    Intolerance to artifical sweeteners, pt states caused burning of bladder   Levaquin  [Levofloxacin ] Nausea Only   Pantoprazole  Sodium Diarrhea    Abdominal pain   Ranitidine Diarrhea and Other (See Comments)    Abdominal pain, also   Omeprazole  Diarrhea and Other (See Comments)    Abdominal pain, also   Sulfasalazine  Rash and Other (See Comments)    Fever, chills, headache, muscle pain also    Patient Measurements: Height: 5' 2 (157.5 cm) Weight: 73.5 kg (162 lb 0.6 oz) IBW/kg (Calculated) : 50.1 HEPARIN  DW (KG): 66  Vital Signs: Temp: 98.4 F (36.9 C) (06/28 1152) Temp Source: Axillary (06/28 1152) BP: 124/71 (06/28 0700) Pulse Rate: 67 (06/28 0700)  Labs: Recent Labs    07/26/23 1134 07/26/23 1140 07/26/23 1524  HGB 9.8* 11.9* 9.2*  HCT 29.1* 35.0* 27.0*  PLT 164  --   --   APTT 30  --   --   LABPROT 15.3*  --   --   INR 1.2  --   --   CREATININE 0.50 0.60  --     Estimated Creatinine Clearance: 57.1 mL/min (by C-G formula based on SCr of 0.6 mg/dL).   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)    Esophageal stricture    Family history of breast cancer    Family history of colon cancer    Family history of melanoma    Family history of prostate cancer    Fibroids 1998   uterine, history of (left ovaries)   GERD (gastroesophageal reflux disease)    History of hiatal hernia    History of uterine prolapse    Hx of adenomatous polyp of colon 10/19/2014   Hyperlipidemia    Hypertension    borderline no meds diet and exercise   Nausea and vomiting 05/19/2023   Neuromuscular disorder (HCC)    neuropathy from chemo    Obesity    Ovarian cancer (HCC)    Peritoneal carcinomatosis (HCC)    PVC (premature ventricular contraction)    first dx by holter in 1980's, echo (6/10) EF 55-60%, normal diastolic fxn, normal size RV and fxn, mild MR, PASP   Squamous cell carcinoma of arm    ovarian   Tubal pregnancy    Ulcerative proctosigmoiditis (HCC)    Urge urinary incontinence 06/03/2007   Qualifier: Diagnosis of  By: Cecilie CMA, NANNIE Ivy       Assessment: 64 YOF with history of DVT on Eliquis  PTA, last dose on 6/26 at 2100.  Patient presented as a code stroke and thrombectomy was unsuccessful.  Pharmacy consulted to dose IV heparin  per stroke protocol.  No bleeding reported.  Goal of Therapy:  Heparin  level 0.3-0.5 units/ml aPTT 66-85 seconds Monitor platelets by anticoagulation protocol: Yes   Plan:  No bolus Start IV heparin  at 800 units/hr Check 6 hr heparin  level and aPTT Daily heparin  level, aPTT and CBC  Rutha Melgoza D. [place], PharmD, BCPS, BCCCP 07/27/2023, 12:06 PM

## 2023-07-27 NOTE — Progress Notes (Signed)
 Pt transported on vent to CT and back to 4N32 without complications.

## 2023-07-27 NOTE — Progress Notes (Signed)
 NIR Progress Note:   Postop Day #1 Attempted right M1 thrombectomy, unsuccessful (TICI 0)   Recommendations:  Supportive postop stroke care and aggressive risk factor optimization. No activity restriction from femoral access standpoint. Please call with questions or concerns.   Overnight events: Remains intubated.    Subjective: Unable to obtain secondary to sedation.    Objective: Physical Exam:   Femoral access site C/D/I and without hematoma Distal pulses intact   Studies/Results:   CT HEAD from this morning: Confluent Right hemisphere cytotoxic edema has developed in both the Right MCA and Right PCA vascular territories. No malignant hemorrhagic transformation. Mild mass effect on the right lateral ventricle with no midline shift.     LOS: 1 day    I spent a total of 15 minutes in face to face in clinical consultation, greater than 50% of which was counseling/coordinating care.

## 2023-07-27 NOTE — Progress Notes (Signed)
 PHARMACY - ANTICOAGULATION CONSULT NOTE  Pharmacy Consult:  Heparin  Indication:  History of DVT, new CVA  Allergies  Allergen Reactions   Aspartame Other (See Comments)    Intolerance to artifical sweeteners, pt states caused burning of bladder   Levaquin  [Levofloxacin ] Nausea Only   Pantoprazole  Sodium Diarrhea    Abdominal pain   Ranitidine Diarrhea and Other (See Comments)    Abdominal pain, also   Omeprazole  Diarrhea and Other (See Comments)    Abdominal pain, also   Sulfasalazine  Rash and Other (See Comments)    Fever, chills, headache, muscle pain also    Patient Measurements: Height: 5' 2 (157.5 cm) Weight: 73.5 kg (162 lb 0.6 oz) IBW/kg (Calculated) : 50.1 HEPARIN  DW (KG): 66  Vital Signs: Temp: 100.3 F (37.9 C) (06/28 2000) Temp Source: Axillary (06/28 2000) BP: 110/60 (06/28 2200) Pulse Rate: 95 (06/28 2200)  Labs: Recent Labs    07/26/23 1134 07/26/23 1140 07/26/23 1524 07/27/23 2144  HGB 9.8* 11.9* 9.2*  --   HCT 29.1* 35.0* 27.0*  --   PLT 164  --   --   --   APTT 30  --   --  49*  LABPROT 15.3*  --   --   --   INR 1.2  --   --   --   HEPARINUNFRC  --   --   --  0.63  CREATININE 0.50 0.60  --   --     Estimated Creatinine Clearance: 57.1 mL/min (by C-G formula based on SCr of 0.6 mg/dL).   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)    Esophageal stricture    Family history of breast cancer    Family history of colon cancer    Family history of melanoma    Family history of prostate cancer    Fibroids 1998   uterine, history of (left ovaries)   GERD (gastroesophageal reflux disease)    History of hiatal hernia    History of uterine prolapse    Hx of adenomatous polyp of colon 10/19/2014   Hyperlipidemia    Hypertension    borderline no meds diet and exercise   Nausea and  vomiting 05/19/2023   Neuromuscular disorder (HCC)    neuropathy from chemo   Obesity    Ovarian cancer (HCC)    Peritoneal carcinomatosis (HCC)    PVC (premature ventricular contraction)    first dx by holter in 1980's, echo (6/10) EF 55-60%, normal diastolic fxn, normal size RV and fxn, mild MR, PASP   Squamous cell carcinoma of arm    ovarian   Tubal pregnancy    Ulcerative proctosigmoiditis (HCC)    Urge urinary incontinence 06/03/2007   Qualifier: Diagnosis of  By: Cecilie CMA, NANNIE Ivy       Assessment: 23 YOF with history of DVT on Eliquis  PTA, last dose on 6/26 at 2100.  Patient presented as a code stroke and thrombectomy was unsuccessful.  Pharmacy consulted to dose IV heparin  per stroke protocol.  No bleeding reported.  6/28 PM update:  aPTT sub-therapeutic   Goal of Therapy:  Heparin  level 0.3-0.5 units/ml aPTT 66-85 seconds Monitor platelets by anticoagulation protocol: Yes   Plan:  Inc heparin  to 900 units/hr Check 8 hr heparin  level and aPTT Daily heparin  level, aPTT  and CBC Monitor for bleeding  Lynwood Mckusick, PharmD, BCPS Clinical Pharmacist Phone: [phone]

## 2023-07-27 NOTE — Progress Notes (Signed)
 Pt transported from 4N32 to MRI and back with no complications

## 2023-07-27 NOTE — Progress Notes (Signed)
   07/27/23 0751  Vent Select  Invasive or Noninvasive Invasive  Adult Vent Y  Airway 7 mm  Placement Date/Time: 07/26/23 (c) 1232   Grade View: Grade 2  Airway Device: Endotracheal Tube  Laryngoscope Blade: MAC;4  ETT Types: Oral  Size (mm): 7 mm  Cuffed: Min.occ.pres.  Insertion attempts: 1  Airway Equipment: Stylet  Placement Confirmation...  Secured at (cm) 23 cm  Measured From Lips  Secured Location Left  Secured By English as a second language teacher No  Tube Holder Repositioned Yes  Prone position No  Cuff Pressure (cm H2O) Green OR 18-26 CmH2O  Site Condition Dry  Adult Ventilator Settings  Vent Type Servo i  Humidity HME  Vent Mode PSV;CPAP  FiO2 (%) (S)  40 %  Pressure Support 5 cmH20  PEEP 5 cmH20  Adult Ventilator Measurements  Peak Airway Pressure 11 L/min  Mean Airway Pressure 7 cmH20  Resp Rate Total 25 br/min  Exhaled Vt 643 mL  Measured Ve 12.3 L  SpO2 98 %  Adult Ventilator Alarms  Alarms On Y  Ve High Alarm 21 L/min  Ve Low Alarm 4 L/min  Resp Rate High Alarm 38 br/min  Resp Rate Low Alarm 8  PEEP Low Alarm 3 cmH2O  Press High Alarm 40 cmH2O  T Apnea 20 sec(s)  VAP Prevention  HOB> 30 Degrees Y  Equipment wiped down Yes  Daily Weaning Assessment  Daily Assessment of Readiness to Wean Wean protocol criteria met (SBT performed)  SBT Method CPAP 5 cm H20 and PS 5 cm H20  Breath Sounds  Bilateral Breath Sounds Rhonchi;Diminished  Vent Respiratory Assessment  Respiratory Pattern Regular;Unlabored  Airway Suctioning/Secretions  Suction Type ETT  Suction Device  Catheter  Secretion Amount Moderate  Secretion Color Clear;White  Secretion Consistency Thick  Suction Tolerance Tolerated well  Suctioning Adverse Effects None      Pt placed on PS/CPAP 5/5 on 40% and is tolerating well. RT will monitor.

## 2023-07-27 NOTE — Progress Notes (Signed)
 Initial Nutrition Assessment  DOCUMENTATION CODES:   Not applicable  INTERVENTION:   Initiate tube feeding via OGT: Osmolite 1.2 at 20 ml/h and increase by 10 ml every 8 hours until goal of 55 ml/hr (1320 ml per day) Prosource TF20 60 ml daily  Provides 1664 kcal, 93 gm protein, 1065 ml free water  daily  MVI with minerals daily  100 mg Thiamine  x 5 days  Monitor for diet advancement   NUTRITION DIAGNOSIS:   Inadequate oral intake related to acute illness as evidenced by NPO status.   GOAL:   Patient will meet greater than or equal to 90% of their needs   MONITOR:   TF tolerance, Diet advancement, Labs, I & O's  REASON FOR ASSESSMENT:   Consult, Ventilator Enteral/tube feeding initiation and management  ASSESSMENT:  78 y.o. female with PMH of ovarian cancer with peritoneal carcinomatosis undergoing chemo,s/p colostomy and  preparing for colostomy revision, left pleural effusion s/p thoracentesis, and HLD presented to ED for code stroke, has left sided weakness.  6/28: Admitted to ICU, s/p thrombectomy with unsuccessful revasc remained intubated post procedure   RD working remotely, information obtained through chart review.    Patient is currently intubated on ventilator support.    Received consult for initiation/management of tube feeds. Per CCM MD ok to start tube feeds. On propofol . Weaning from vent. Weight appears stable. Exam deferred to follow up.   Patient is currently intubated on ventilator support MV: 11.3 L/min Temp (24hrs), Avg:98.4 F (36.9 C), Min:97.9 F (36.6 C), Max:99.1 F (37.3 C)  MAP (cuff): 83 mmHg  Propofol : 15.54 ml/hr  Admit weight: 74.2 kg  Current weight: 73.5 kg    Intake/Output Summary (Last 24 hours) at 07/27/2023 1542 Last data filed at 07/27/2023 0800 Gross per 24 hour  Intake 472.89 ml  Output 1650 ml  Net -1177.11 ml   Net IO Since Admission: -427.11 mL [07/27/23 1542]  Drains/Lines: Colostomy 300 ml x 24  hours UOP: 1650 ml x 24 hours   Nutritionally Relevant Medications: Scheduled Meds:  Chlorhexidine  Gluconate Cloth  6 each Topical Daily   docusate  100 mg Per Tube BID   famotidine   20 mg Per Tube BID   multivitamin with minerals  1 tablet Oral Daily   mouth rinse  15 mL Mouth Rinse Q2H   polyethylene glycol  17 g Per Tube Daily   rosuvastatin   20 mg Per Tube QHS   thiamine   100 mg Oral Daily   Continuous Infusions:  clevidipine     feeding supplement (OSMOLITE 1.2 CAL)     heparin  800 Units/hr (07/27/23 1346)   propofol  (DIPRIVAN ) infusion 35 mcg/kg/min (07/27/23 1035)   sodium chloride  (hypertonic) 75 mL/hr at 07/27/23 1332    Labs Reviewed: Calcium  8.5 Albumin  3.1 Total protein 4.4 Vitamin D  24.95 CBG ranges from 144-144 mg/dL over the last 24 hours HgbA1c 6.3  NUTRITION - FOCUSED PHYSICAL EXAM:  - Deferred to follow up   Diet Order:   Diet Order             Diet NPO time specified  Diet effective now                   EDUCATION NEEDS:   Not appropriate for education at this time  Skin:  Skin Assessment: Reviewed RN Assessment  Last BM:  300 ml x 24 hours via colosotmy  Height:   Ht Readings from Last 1 Encounters:  07/26/23 5' 2 (1.575 m)  Weight:   Wt Readings from Last 1 Encounters:  07/27/23 73.5 kg    BMI:  Body mass index is 29.64 kg/m.  Estimated Nutritional Needs:   Kcal:  1600-1800 kcal  Protein:  80-100 gm  Fluid:  >1.6L/day   Olivia Kenning, RD Registered Dietitian  See Amion for more information

## 2023-07-27 NOTE — Progress Notes (Addendum)
 OT Cancellation Note  Patient Details Name: Rachel Carlson MRN: [id number] DOB: 1945/03/01   Cancelled Treatment:    Reason Eval/Treat Not Completed: Medical issues which prohibited therapy (remains intubated, sedated, will follow up for OT evaluation as schedule permits)  Rachel Carlson, OTD, OTR/L SecureChat [facility] [phone]   Rachel Carlson 07/27/2023, 12:02 PM

## 2023-07-28 ENCOUNTER — Inpatient Hospital Stay (HOSPITAL_COMMUNITY)

## 2023-07-28 DIAGNOSIS — I63411 Cerebral infarction due to embolism of right middle cerebral artery: Secondary | ICD-10-CM | POA: Diagnosis not present

## 2023-07-28 LAB — APTT
aPTT: 65 s — ABNORMAL HIGH (ref 24–36)
aPTT: 78 s — ABNORMAL HIGH (ref 24–36)

## 2023-07-28 LAB — BASIC METABOLIC PANEL WITH GFR
Anion gap: 6 (ref 5–15)
BUN: 7 mg/dL — ABNORMAL LOW (ref 8–23)
CO2: 21 mmol/L — ABNORMAL LOW (ref 22–32)
Calcium: 8.8 mg/dL — ABNORMAL LOW (ref 8.9–10.3)
Chloride: 125 mmol/L — ABNORMAL HIGH (ref 98–111)
Creatinine, Ser: 0.45 mg/dL (ref 0.44–1.00)
GFR, Estimated: >60 mL/min (ref >60)
Glucose, Bld: 159 mg/dL — ABNORMAL HIGH (ref 70–99)
Potassium: 3 mmol/L — ABNORMAL LOW (ref 3.5–5.1)
Sodium: 152 mmol/L — ABNORMAL HIGH (ref 135–145)

## 2023-07-28 LAB — CBC
HCT: 28.2 % — ABNORMAL LOW (ref 36.0–46.0)
Hemoglobin: 9 g/dL — ABNORMAL LOW (ref 12.0–15.0)
MCH: 32 pg (ref 26.0–34.0)
MCHC: 31.9 g/dL (ref 30.0–36.0)
MCV: 100.4 fL — ABNORMAL HIGH (ref 80.0–100.0)
Platelets: 192 10*3/uL (ref 150–400)
RBC: 2.81 MIL/uL — ABNORMAL LOW (ref 3.87–5.11)
RDW: 16 % — ABNORMAL HIGH (ref 11.5–15.5)
WBC: 3.8 10*3/uL — ABNORMAL LOW (ref 4.0–10.5)
nRBC: 0 % (ref 0.0–0.2)

## 2023-07-28 LAB — GLUCOSE, CAPILLARY
Glucose-Capillary: 103 mg/dL — ABNORMAL HIGH (ref 70–99)
Glucose-Capillary: 118 mg/dL — ABNORMAL HIGH (ref 70–99)
Glucose-Capillary: 119 mg/dL — ABNORMAL HIGH (ref 70–99)
Glucose-Capillary: 134 mg/dL — ABNORMAL HIGH (ref 70–99)
Glucose-Capillary: 167 mg/dL — ABNORMAL HIGH (ref 70–99)
Glucose-Capillary: 95 mg/dL (ref 70–99)

## 2023-07-28 LAB — SODIUM
Sodium: 143 mmol/L (ref 135–145)
Sodium: 151 mmol/L — ABNORMAL HIGH (ref 135–145)
Sodium: 151 mmol/L — ABNORMAL HIGH (ref 135–145)
Sodium: 152 mmol/L — ABNORMAL HIGH (ref 135–145)
Sodium: 155 mmol/L — ABNORMAL HIGH (ref 135–145)

## 2023-07-28 LAB — HEPARIN LEVEL (UNFRACTIONATED): Heparin Unfractionated: 0.76 [IU]/mL — ABNORMAL HIGH (ref 0.30–0.70)

## 2023-07-28 MED ORDER — SODIUM CHLORIDE 3 % IV BOLUS
250.0000 mL | Freq: Once | INTRAVENOUS | Status: AC
  Administered 2023-07-28: 250 mL via INTRAVENOUS

## 2023-07-28 NOTE — Progress Notes (Signed)
 NAME:  Rachel Carlson, MRN:  [id number], DOB:  1945/07/03, LOS: 2 ADMISSION DATE:  07/26/2023, CONSULTATION DATE: 07/26/2023 REFERRING MD: Dr. 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 LKW 10:00. Was found sometime after than on the toilet, had falled couldn't get up and had L sided weakness, R gaze, L facial droop.  Imaging in ED w M1 occlusion. In ED was lethargic but able to state that she took eliquis  this morning.   She went for thrombectomy w NIR, unsuccessful revasc.  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)    Esophageal stricture    Family history of breast cancer    Family history of colon cancer    Family history of melanoma    Family history of prostate cancer    Fibroids 1998   uterine, history of (left ovaries)   GERD (gastroesophageal reflux disease)    History of hiatal hernia    History of uterine prolapse    Hx of adenomatous polyp of colon 10/19/2014   Hyperlipidemia    Hypertension    borderline no meds diet and exercise   Nausea and vomiting 05/19/2023   Neuromuscular disorder (HCC)    neuropathy from chemo   Obesity    Ovarian cancer (HCC)    Peritoneal carcinomatosis (HCC)    PVC (premature ventricular contraction)    first dx by holter in 1980's, echo (6/10) EF 55-60%, normal diastolic fxn, normal size RV and fxn, mild MR, PASP    Squamous cell carcinoma of arm    ovarian   Tubal pregnancy    Ulcerative proctosigmoiditis (HCC)    Urge urinary incontinence 06/03/2007   Qualifier: Diagnosis of  By: Cecilie CMA, NANNIE Mitzie Iline Lionel Events: Including procedures, antibiotic start and stop dates in addition to other pertinent events   M1 occlusion, unsuccessful thrombectomy 6/28-on ventilator, weaning well 6/28 MRI-large acute right MCA territory infarct with 4 mm leftward shift, occlusion of right M1 MCA  Interim History / Subjective:  She is weaning on the ventilator Does attempt to follow commands  Objective    Blood pressure (!) 148/72, pulse 97, temperature (!) 100.8 F (38.2 C), temperature source Axillary, resp. rate (!) 31, height 5' 2 (1.575 m), weight 73.5 kg, last menstrual period 05/29/1996, SpO2 100%.    Vent Mode: PSV;CPAP FiO2 (%):  [40 %] 40 % Set Rate:  [18 bmp] 18 bmp Vt Set:  [400 mL] 400 mL PEEP:  [5 cmH20] 5 cmH20 Pressure Support:  [5 cmH20-10 cmH20] 10 cmH20 Plateau Pressure:  [174 cmH20] 174 cmH20   Intake/Output Summary (Last 24 hours) at 07/28/2023 1033 Last data filed at 07/28/2023 0808 Gross per 24 hour  Intake 2625.88 ml  Output 3300 ml  Net -674.12 ml   Filed Weights   07/26/23 1100 07/26/23 1243 07/27/23 0532  Weight: 74.2 kg 74 kg 73.5 kg   Examination: General: Elderly, does not appear to be in distress  HENT: Endotracheal tube in place Lungs: Clear breath sounds Cardiovascular: S1-S2 appreciated Abdomen: Soft, bowel sounds appreciated Extremities: No clubbing, no edema Neuro: Alert, oriented x 3 GU:   I reviewed last 24 h vitals and pain scores, last 48 h intake and output, last 24 h labs and trends, and last 24 h imaging results.  Resolved problem list   Assessment and Plan   M1 occlusion with unsuccessful thrombectomy - Repeat CT showing cytotoxic edema - MRI with persistent occlusion with midline shift - Patient started on  hypertonic saline  Hypoxemic respiratory failure - Continue mechanical ventilation -Target TVol 6-8cc/kgIBW -Target Plateau Pressure < 30cm H20 -Target driving pressure less than 15 cm of water  -Target PaO2 55-65: titrate PEEP/FiO2 per protocol -Ventilator associated pneumonia prevention protocol - She is weaning on pressure support  Uterine cancer with peritoneal carcinomatosis  Malignant pleural effusion  Ongoing discussion with family  Worsening status with respect to cytotoxic edema  Best Practice (right click and Reselect all SmartList Selections daily)   Diet/type: tubefeeds DVT prophylaxis systemic heparin  Pressure ulcer(s): N/A GI prophylaxis: N/A Lines: N/A Foley:  N/A Code Status:  full code Last date of multidisciplinary goals of care discussion [spoke with son and spouse at bedside]  Labs   CBC: Recent Labs  Lab 07/23/23 1230 07/26/23 1134 07/26/23 1140 07/26/23 1524 07/28/23 0612  WBC 4.6 5.0  --   --  3.8*  NEUTROABS 2.9 4.6  --   --   --   HGB 12.7 9.8* 11.9* 9.2* 9.0*  HCT 37.6 29.1* 35.0* 27.0* 28.2*  MCV 93.3 95.4  --   --  100.4*  PLT 212.0 164  --   --  192    Basic Metabolic Panel: Recent Labs  Lab 07/23/23 1230 07/26/23 1134 07/26/23 1140 07/26/23 1524 07/27/23 1302 07/27/23 1734 07/27/23 2334 07/28/23 0612  NA 135 136 137 138 142 145 143 151*  K 4.2 3.6 3.7 3.5  --   --   --   --   CL 100 105 101  --   --   --   --   --   CO2 28 20*  --   --   --   --   --   --   GLUCOSE 108* 161* 136*  --   --   --   --   --   BUN 15 9 15   --   --   --   --   --   CREATININE 0.76 0.50 0.60  --   --   --   --   --   CALCIUM  9.8 8.5*  --   --   --   --   --   --    GFR: Estimated Creatinine Clearance: 57.1 mL/min (by C-G formula based on SCr of 0.6 mg/dL). Recent Labs  Lab 07/23/23 1230 07/26/23 1134 07/28/23 0612  WBC 4.6 5.0 3.8*    Liver Function Tests: Recent Labs  Lab 07/26/23 1134  AST 16  ALT 11  ALKPHOS 58  BILITOT 0.6   PROT 4.4*  ALBUMIN  3.1*   No results for input(s): LIPASE, AMYLASE in the last 168 hours. No results for input(s): AMMONIA in the last 168 hours.  ABG    Component Value Date/Time   PHART 7.359 07/26/2023 1524   PCO2ART 38.0 07/26/2023 1524   PO2ART 163 (H)  07/26/2023 1524   HCO3 21.4 07/26/2023 1524   TCO2 23 07/26/2023 1524   ACIDBASEDEF 4.0 (H) 07/26/2023 1524   O2SAT 99 07/26/2023 1524     Coagulation Profile: Recent Labs  Lab 07/26/23 1134  INR 1.2    Cardiac Enzymes: No results for input(s): CKTOTAL, CKMB, CKMBINDEX, TROPONINI in the last 168 hours.  HbA1C: Hgb A1c MFr Bld  Date/Time Value Ref Range Status  07/23/2023 12:30 PM 6.3 4.6 - 6.5 % Final    Comment:    Glycemic Control Guidelines for People with Diabetes:Non Diabetic:  <6%Goal of Therapy: <7%Additional Action Suggested:  >8%   08/13/2022 08:43 AM 6.3 4.6 - 6.5 % Final    Comment:    Glycemic Control Guidelines for People with Diabetes:Non Diabetic:  <6%Goal of Therapy: <7%Additional Action Suggested:  >8%     CBG: Recent Labs  Lab 07/26/23 1135 07/27/23 1956 07/28/23 0000 07/28/23 0351 07/28/23 0804  GLUCAP 144* 105* 103* 95 118*    Review of Systems:   Unable to provide history  Past Medical History:  She,  has a past medical history of Blood transfusion without reported diagnosis, Chronic right ear pain, Diverticulosis, DVT of leg (deep venous thrombosis) (HCC), Esophageal stricture, Family history of breast cancer, Family history of colon cancer, Family history of melanoma, Family history of prostate cancer, Fibroids (1998), GERD (gastroesophageal reflux disease), History of hiatal hernia, History of uterine prolapse, adenomatous polyp of colon (10/19/2014), Hyperlipidemia, Hypertension, Nausea and vomiting (05/19/2023), Neuromuscular disorder (HCC), Obesity, Ovarian cancer (HCC), Peritoneal carcinomatosis (HCC), PVC (premature ventricular contraction), Squamous cell carcinoma  of arm, Tubal pregnancy, Ulcerative proctosigmoiditis (HCC), and Urge urinary incontinence (06/03/2007).   Surgical History:   Past Surgical History:  Procedure Laterality Date   BILATERAL SALPINGECTOMY Bilateral 12/12/2022   Procedure: OPEN BILATERAL SALPINGECTOMY-OOPHORECTOMY;  Surgeon: Viktoria Comer SAUNDERS, MD;  Location: [facility];  Service: Gynecology;  Laterality: Bilateral;   BLADDER NECK SUSPENSION  1998   BREAST BIOPSY Left    BREAST LUMPECTOMY Left    benign   CATARACT EXTRACTION Right 01/2020   CATARACT EXTRACTION W/PHACO Left 08/12/2020   Procedure: CATARACT EXTRACTION PHACO AND INTRAOCULAR LENS PLACEMENT LEFT EYE;  Surgeon: Harrie Agent, MD;  Location: [facility];  Service: Ophthalmology;  Laterality: Left;  left CDE=10.71   COLONOSCOPY  05/18/2011/10/12/14   Dr. Lupita Commander   DEBULKING N/A 12/12/2022   Procedure: RADICAL TUMOR DEBULKING, PERITONEAL STRIPPING, MOBILIZATION SPLENIC FLEXURE, RECTOSIGMOID RESECTION,SMALL BOWEL RESECTION AND REANASTOMOSIS ,END TO END DESCENDING COLOSTOMY;  Surgeon: Viktoria Comer SAUNDERS, MD;  Location: [facility];  Service: Gynecology;  Laterality: N/A;   ECTOPIC PREGNANCY SURGERY  1980   ESOPHAGOGASTRODUODENOSCOPY (EGD) WITH ESOPHAGEAL DILATION  04/03/2012   Dr. Lupita Commander   INCISION AND DRAINAGE ABSCESS N/A 01/02/2023   Procedure: INCISION AND DRAINAGE OF ABDOMINAL WOUND SEROMA;  Surgeon: Rogelio Planas, MD;  Location: [facility];  Service: Gynecology;  Laterality: N/A;   IR CT HEAD LTD  07/26/2023   IR IMAGING GUIDED PORT INSERTION  09/06/2022   IR PERCUTANEOUS ART THROMBECTOMY/INFUSION INTRACRANIAL INC DIAG ANGIO  07/26/2023   IR US  GUIDE VASC ACCESS RIGHT  07/26/2023   OMENTECTOMY N/A 12/12/2022   Procedure: OMENTECTOMY;  Surgeon: Viktoria Comer SAUNDERS, MD;  Location: [facility];  Service: Gynecology;  Laterality: N/A;   PARTIAL HYSTERECTOMY     abdominal, fibroids, and prolapse (1998) bladder tack   SALPINGOOPHORECTOMY Bilateral 12/12/2022   Procedure:  DIAGNOSTIC LAPAROSCOPY;  Surgeon: Viktoria Comer SAUNDERS, MD;  Location: [facility];  Service:  Gynecology;  Laterality: Bilateral;   SKIN CANCER EXCISION     UPPER GASTROINTESTINAL ENDOSCOPY       Social History:   reports that she has never smoked. She has never used smokeless tobacco. She reports that she does not drink alcohol  and does not use drugs.   Family History:  Her family history includes Arrhythmia in her brother; Barrett's esophagus in her maternal aunt and mother; Breast cancer in her sister; Colon cancer in her cousin and another family member; Colon cancer (age of onset: 61) in her son; Colon cancer (age of onset: 76) in her brother; Colon polyps in her sister; Congestive Heart Failure in her maternal grandmother; Coronary artery disease in her mother; Diabetes in her mother; Diabetes type II in her mother; Ehlers-Danlos syndrome in her daughter; Heart attack (age of onset: 62) in her mother; Melanoma in her maternal uncle; Other in her son; Prostate cancer in her maternal grandfather; Skin cancer in her maternal aunt and mother; Sudden death in her mother; Sudden death (age of onset: 1) in her father; Transient ischemic attack in her mother; Tuberculosis in her paternal grandfather. There is no history of Esophageal cancer, Stomach cancer, Rectal cancer, Uterine cancer, or Bladder Cancer.   Allergies Allergies  Allergen Reactions   Aspartame Other (See Comments)    Intolerance to artifical sweeteners, pt states caused burning of bladder   Levaquin  [Levofloxacin ] Nausea Only   Pantoprazole  Sodium Diarrhea    Abdominal pain   Ranitidine Diarrhea and Other (See Comments)    Abdominal pain, also   Omeprazole  Diarrhea and Other (See Comments)    Abdominal pain, also   Sulfasalazine  Rash and Other (See Comments)    Fever, chills, headache, muscle pain also    The patient is critically ill with multiple organ systems failure and requires high complexity decision making for assessment  and support, frequent evaluation and titration of therapies, application of advanced monitoring technologies and extensive interpretation of multiple databases. Critical Care Time devoted to patient care services described in this note independent of APP/resident time (if applicable)  is 32 minutes.   Jennet Epley MD [facility] Personal pager: See Amion If unanswered, please page CCM On-call: #[phone]

## 2023-07-28 NOTE — Evaluation (Signed)
 Physical Therapy Evaluation Patient Details Name: Rachel Carlson MRN: [id number] DOB: [birth year] Today's Date: 07/28/2023  History of Present Illness  Pt is a 78 y/o F presenting to ED on 6/27 as code stroke with L weakness, R gaze, L facial droop, MRI wtih large R MCA territory infarct with 4mm leftward shift, R M1 MCA occlusion. S/p unsuccessful thrombectomy on 6/27. 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 Pt from home with family where she has been caring for husband with ALS as well as undergoing cancer treatment herself. Son lives with them and assists as well. Pt on vent with light sedation on eval so was lethargic but followed commands consistently with R side. No active motion noted on L side but pt with some reflexive movements to stimulation. Pt bed placed in chair position and pt participating in coming into long sitting and opened eyes while in this position. Upward gaze B eyes and no blink to threat on L. Patient will benefit from continued inpatient follow up therapy, <3 hours/day.  Pt currently with functional limitations due to the deficits listed below (see PT Problem List). Pt will benefit from acute skilled PT to increase their independence and safety with mobility to allow discharge.           If plan is discharge home, recommend the following: Two people to help with walking and/or transfers;Two people to help with bathing/dressing/bathroom;Assistance with cooking/housework;Assistance with feeding;Direct supervision/assist for medications management;Direct supervision/assist for financial management;Assist for transportation;Help with stairs or ramp for entrance;Supervision due to cognitive status   Can travel by private vehicle   No    Equipment Recommendations Other (comment) (TBD)   Recommendations for Other Services       Functional Status Assessment Patient has had a recent decline in their functional status and demonstrates the ability to make significant improvements in function in a reasonable and predictable amount of time.     Precautions / Restrictions Precautions Precautions: Fall Precaution/Restrictions Comments: intubated, cortrak Restrictions Weight Bearing Restrictions Per Provider Order: No      Mobility  Bed Mobility Overal bed mobility: Needs Assistance Bed Mobility: Supine to Sit     Supine to sit: Total assist, +2 for physical assistance     General bed mobility comments: total +2 to come into bed-chair pt with L lateral lean, holds head up unsupported. Needed max A +2 to come to long sitting from reclined position. This effort did result in pt opening eyes.    Transfers                   General transfer comment: deferred    Ambulation/Gait               General Gait Details: unable  Stairs            Wheelchair Mobility     Tilt Bed    Modified Rankin (Stroke Patients Only)       Balance Overall balance assessment: Needs assistance Sitting-balance support: Bilateral upper extremity supported, Feet supported Sitting balance-Leahy Scale: Poor Sitting balance - Comments: needed max A to maintain long sitting with bed in chair                                     Pertinent Vitals/Pain Pain  Assessment Pain Assessment: Faces Faces Pain Scale: Hurts a little bit Pain Location: with L UE/ L LE movement Pain Descriptors / Indicators: Grimacing, Discomfort Pain Intervention(s): Limited activity within patient's tolerance, Monitored during session    Home Living Family/patient expects to be discharged to:: Private residence Living Arrangements: Spouse/significant other;Children (son lives at home) Available Help at Discharge: Family;Available 24 hours/day Type of Home: House Home  Access: Ramped entrance       Home Layout: One level Home Equipment: Shower seat - built in;Grab bars - tub/shower;Grab bars - toilet;Rolling Walker (2 wheels);Cane - single point;Rollator (4 wheels);Hospital bed Additional Comments: Reports home accessible as spouse is handicapped by ALS; reports has hospital bed with rails and trapeze that she can use    Prior Function Prior Level of Function : Independent/Modified Independent;Driving             Mobility Comments: ind ADLs Comments: independent with adls and iadls; spouse is disabled (she doesn't have to physically strain other than helping with compression socks and aware she will not be able to at d/c)     Extremity/Trunk Assessment   Upper Extremity Assessment Upper Extremity Assessment: Defer to OT evaluation RUE Deficits / Details: 3/5 globally, incr time to follow motor commands RUE Coordination: decreased fine motor;decreased gross motor LUE Deficits / Details: no AROM noted, PROM WFL, responds to painful stimuli on L UE LUE Sensation: decreased light touch;decreased proprioception LUE Coordination: decreased fine motor;decreased gross motor    Lower Extremity Assessment Lower Extremity Assessment: RLE deficits/detail;LLE deficits/detail RLE Deficits / Details: RLE grossly >3/5. At times needs tactile cues to initiate mvmt but can lift LE, hold it in bridge position, move ankle and toes. Moves RLE when cued to move L RLE Sensation: WNL RLE Coordination: WNL LLE Deficits / Details: reflexive motion noted LLE but no active motion. Pt with increased tone in adductors as well as ankle inverters. LLE Sensation: decreased light touch;decreased proprioception LLE Coordination: decreased gross motor;decreased fine motor    Cervical / Trunk Assessment Cervical / Trunk Assessment: Normal  Communication   Communication Communication: Impaired Factors Affecting Communication: Trach/intubated;Difficulty expressing self     Cognition Arousal: Lethargic, Suspect due to medications Behavior During Therapy: Flat affect   PT - Cognitive impairments: Difficult to assess, Sequencing, Problem solving, Safety/Judgement Difficult to assess due to: Intubated (with low sedation)                     PT - Cognition Comments: follows commands on R side consistently. Kept eyes open when supine but opened them for brief period when bed placed in chair. B upward gaze with no blink to threat on L. Following commands: Impaired Following commands impaired: Follows one step commands inconsistently     Cueing Cueing Techniques: Verbal cues, Gestural cues     General Comments General comments (skin integrity, edema, etc.): VSS on vent. Husband, son, and daughter (from [place] ) present    Exercises Other Exercises Other Exercises: PROM to L hip, knee, and ankle   Assessment/Plan    PT Assessment Patient needs continued PT services  PT Problem List Decreased strength;Decreased range of motion;Decreased activity tolerance;Decreased balance;Decreased mobility;Decreased coordination;Decreased cognition;Decreased knowledge of use of DME;Decreased safety awareness;Decreased knowledge of precautions;Cardiopulmonary status limiting activity;Impaired sensation;Impaired tone       PT Treatment Interventions DME instruction;Gait training;Functional mobility training;Therapeutic activities;Therapeutic exercise;Balance training;Neuromuscular re-education;Cognitive remediation;Patient/family education;Wheelchair mobility training    PT Goals (Current goals can be found in the Care Plan section)  Acute Rehab PT Goals Patient Stated Goal: pt on vent, none stated PT Goal Formulation: With patient/family Time For Goal Achievement: 08/11/23 Potential to Achieve Goals: Fair    Frequency Min 2X/week     Co-evaluation PT/OT/SLP Co-Evaluation/Treatment: Yes Reason for Co-Treatment: Complexity of the patient's impairments  (multi-system involvement);Necessary to address cognition/behavior during functional activity;To address functional/ADL transfers PT goals addressed during session: Mobility/safety with mobility;Balance         AM-PAC PT 6 Clicks Mobility  Outcome Measure Help needed turning from your back to your side while in a flat bed without using bedrails?: A Lot Help needed moving from lying on your back to sitting on the side of a flat bed without using bedrails?: Total Help needed moving to and from a bed to a chair (including a wheelchair)?: Total Help needed standing up from a chair using your arms (e.g., wheelchair or bedside chair)?: Total Help needed to walk in hospital room?: Total Help needed climbing 3-5 steps with a railing? : Total 6 Click Score: 7    End of Session Equipment Utilized During Treatment: Oxygen Activity Tolerance: Patient tolerated treatment well Patient left: in bed;with call bell/phone within reach;with family/visitor present Nurse Communication: Mobility status PT Visit Diagnosis: Hemiplegia and hemiparesis;Difficulty in walking, not elsewhere classified (R26.2);Other symptoms and signs involving the nervous system (R29.898) Hemiplegia - Right/Left: Left Hemiplegia - dominant/non-dominant: Non-dominant Hemiplegia - caused by: Cerebral infarction    Time: 1129-1201 PT Time Calculation (min) (ACUTE ONLY): 32 min   Charges:   PT Evaluation $PT Eval Moderate Complexity: 1 Mod   PT General Charges $$ ACUTE PT VISIT: 1 Visit         Richerd Lipoma, PT  Acute Rehab Services Secure chat preferred Office [phone]   Richerd CROME Quantina Dershem 07/28/2023, 1:47 PM

## 2023-07-28 NOTE — Consult Note (Signed)
 WOC consulted for osotmy; patient sedated and intubated for new onset stroke.  Will have K. Mayo follow up in the morning about supply needs.   Electra Paladino [facility], CNS, [employer] [phone]

## 2023-07-28 NOTE — Progress Notes (Signed)
 PHARMACY - ANTICOAGULATION CONSULT NOTE  Pharmacy Consult:  Heparin  Indication:  History of DVT, new CVA  Allergies  Allergen Reactions   Aspartame Other (See Comments)    Intolerance to artifical sweeteners, pt states caused burning of bladder   Levaquin  [Levofloxacin ] Nausea Only   Pantoprazole  Sodium Diarrhea    Abdominal pain   Ranitidine Diarrhea and Other (See Comments)    Abdominal pain, also   Omeprazole  Diarrhea and Other (See Comments)    Abdominal pain, also   Sulfasalazine  Rash and Other (See Comments)    Fever, chills, headache, muscle pain also    Patient Measurements: Height: 5' 2 (157.5 cm) Weight: 73.5 kg (162 lb 0.6 oz) IBW/kg (Calculated) : 50.1 HEPARIN  DW (KG): 66  Vital Signs: Temp: 100.9 F (38.3 C) (06/29 1600) Temp Source: Axillary (06/29 1600) BP: 132/71 (06/29 1900) Pulse Rate: 105 (06/29 1900)  Labs: Recent Labs    07/26/23 1134 07/26/23 1140 07/26/23 1524 07/27/23 2144 07/28/23 0534 07/28/23 0612 07/28/23 1800  HGB 9.8* 11.9* 9.2*  --   --  9.0*  --   HCT 29.1* 35.0* 27.0*  --   --  28.2*  --   PLT 164  --   --   --   --  192  --   APTT 30  --   --  49*  --  65* 78*  LABPROT 15.3*  --   --   --   --   --   --   INR 1.2  --   --   --   --   --   --   HEPARINUNFRC  --   --   --  0.63  --  0.76*  --   CREATININE 0.50 0.60  --   --  0.45  --   --     Estimated Creatinine Clearance: 57.1 mL/min (by C-G formula based on SCr of 0.45 mg/dL).   Assessment: Rachel Carlson is a 78 y.o. year old female admitted on 07/26/2023 with concern for stroke s/p unsuccessful thrombectomy. Eliquis  prior to admission for hx of DVT (last dose 6/26 2100). Will monitor aPTT for heparin  dosing until correlates with anti-xa level (heparin  levels) which are elevated given DOAC use prior to admission. Pharmacy consulted to dose heparin  per stroke protocol.  aPTT 78 secs, therapeutic No overt s/sx bleeding or issues with infusion noted. Heparin  currently  running at 950 units/hr.  Goal of Therapy:  Heparin  level 0.3-0.5 units/ml aPTT 66-85 seconds Monitor platelets by anticoagulation protocol: Yes   Plan:  Continue IV heparin  to 950 units/hr Daily heparin  level, aPTT and CBC  Thank you for allowing pharmacy to participate in this patient's care.  Leonor GORMAN Bash, PharmD Emergency Medicine Clinical Pharmacist 07/28/2023,7:56 PM

## 2023-07-28 NOTE — Evaluation (Addendum)
 Occupational Therapy Evaluation Patient Details Name: Rachel Carlson MRN: [id number] DOB: August 20, 1945 Today's Date: 07/28/2023   History of Present Illness   Pt is a 78 y/o F presenting to ED on 6/27 as code stroke with L weakness, R gaze, L facial droop, MRI wtih large R MCA territory infarct with 4mm leftward shift, R M1 MCA occlusion. S/p unsuccessful thrombectomy on 6/27. 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 Pt currently lethargic, keeps eyes closed majority of the session, noted to have upward gaze when eyes did open and only reacted to stimuli on R side of face/R visual field. Pt with no AROM noted in LUE, does react to  noxious stimuli. Dependent at this time for ADLs and mobility, however follows commands well with RUE/RLE. Pt presenting with impairments listed below, will follow acutely. Patient will benefit from continued inpatient follow up therapy, <3 hours/day to maximize safety/ind with ADL/functional mobility.      If plan is discharge home, recommend the following:   Two people to help with walking and/or transfers;A lot of help with bathing/dressing/bathroom;Assistance with cooking/housework;Assistance with feeding;Direct supervision/assist for medications management;Direct supervision/assist for financial management;Assist for transportation;Supervision due to cognitive status;Help with stairs or ramp for entrance     Functional Status Assessment   Patient has had a recent decline in their functional status and demonstrates the ability to make significant improvements in function in a reasonable and predictable amount of time.     Equipment Recommendations   Other (comment) (defer)      Recommendations for Other Services   PT consult     Precautions/Restrictions   Precautions Precautions: Fall Precaution/Restrictions Comments: intubated, cortrak Restrictions Weight Bearing Restrictions Per Provider Order: No     Mobility Bed Mobility Overal bed mobility: Needs Assistance Bed Mobility: Supine to Sit     Supine to sit: Total assist, +2 for physical assistance     General bed mobility comments: total +2 to come into bed-chair pt with L lateral lean, holds head up unsupported with bed off of mattress    Transfers                   General transfer comment: deferred      Balance Overall balance assessment:  (unable to assess this session)                                         ADL either performed or assessed with clinical judgement   ADL                                         General ADL Comments: dependent for all ADLs at this time     Vision   Vision Assessment?: Vision impaired- to be further tested in functional context Additional Comments: pt keeps eyes closed ~95% of session, does open eyes when placed in bed/chair position briefly, pt noted to have upward gaze, does blink to threat on R side only     Perception         Praxis  Pertinent Vitals/Pain Pain Assessment Pain Assessment: Faces Pain Score: 2  Faces Pain Scale: Hurts a little bit Pain Location: with L UE/ L LE movement Pain Descriptors / Indicators: Grimacing, Discomfort Pain Intervention(s): Limited activity within patient's tolerance, Monitored during session, Repositioned     Extremity/Trunk Assessment Upper Extremity Assessment Upper Extremity Assessment: LUE deficits/detail;Difficult to assess due to impaired cognition;RUE deficits/detail RUE Deficits / Details: 3/5 globally, incr time to follow motor commands RUE Coordination: decreased fine motor;decreased gross motor LUE Deficits / Details: no AROM noted,  PROM WFL, responds to painful stimuli on L UE LUE Sensation: decreased light touch;decreased proprioception LUE Coordination: decreased fine motor;decreased gross motor   Lower Extremity Assessment Lower Extremity Assessment: Defer to PT evaluation       Communication Communication Communication: Impaired Factors Affecting Communication: Trach/intubated;Difficulty expressing self   Cognition Arousal: Lethargic Behavior During Therapy: Flat affect Cognition: Difficult to assess Difficult to assess due to: Intubated, Level of arousal           OT - Cognition Comments: pt keeping eyes closed, following motor commands to R side only, occasionally nodding yes/no in response to questions asked but inconsistent, more partcipatory in bed/chair position                 Following commands: Impaired Following commands impaired: Follows one step commands inconsistently     Cueing  General Comments   Cueing Techniques: Verbal cues;Gestural cues  VSS on vent   Exercises     Shoulder Instructions      Home Living Family/patient expects to be discharged to:: Private residence Living Arrangements: Spouse/significant other;Children (son lives at home) Available Help at Discharge: Family;Available 24 hours/day Type of Home: House Home Access: Ramped entrance     Home Layout: One level     Bathroom Shower/Tub: Producer, television/film/video: Handicapped height     Home Equipment: Shower seat - built in;Grab bars - tub/shower;Grab bars - toilet;Rolling Walker (2 wheels);Cane - single point;Rollator (4 wheels);Hospital bed   Additional Comments: Reports home accessible as spouse is handicap; reports has hospital bed with rails and trapeze that she can use      Prior Functioning/Environment Prior Level of Function : Independent/Modified Independent;Driving             Mobility Comments: ind ADLs Comments: independent with adls and iadls; spouse is disabled (she  doesn't have to physically strain other than helping with compression socks and aware she will not be able to at d/c)    OT Problem List: Decreased strength;Decreased range of motion;Decreased activity tolerance;Impaired balance (sitting and/or standing);Decreased cognition;Decreased coordination;Decreased safety awareness;Cardiopulmonary status limiting activity   OT Treatment/Interventions: Self-care/ADL training;Therapeutic exercise;Energy conservation;DME and/or AE instruction;Therapeutic activities;Patient/family education;Balance training;Cognitive remediation/compensation;Visual/perceptual remediation/compensation;Neuromuscular education;Splinting;Modalities;Manual therapy      OT Goals(Current goals can be found in the care plan section)   Acute Rehab OT Goals Patient Stated Goal: pt unable to state OT Goal Formulation: Patient unable to participate in goal setting Time For Goal Achievement: 08/11/23 Potential to Achieve Goals: Good ADL Goals Pt Will Perform Grooming: with min assist;sitting Pt/caregiver will Perform Home Exercise Program: Left upper extremity;Increased ROM;Increased strength;With written HEP provided;With minimal assist Additional ADL Goal #1: pt will perform bed mobility mod A in prep for ADLs Additional ADL Goal #2: pt will follow 2 step command with min cues in prep for ADLs   OT Frequency:  Min 2X/week    Co-evaluation PT/OT/SLP Co-Evaluation/Treatment: Yes Reason for Co-Treatment: Complexity of the patient's impairments (  multi-system involvement);Necessary to address cognition/behavior during functional activity;To address functional/ADL transfers          AM-PAC OT 6 Clicks Daily Activity     Outcome Measure Help from another person eating meals?: Total Help from another person taking care of personal grooming?: A Lot Help from another person toileting, which includes using toliet, bedpan, or urinal?: Total Help from another person bathing  (including washing, rinsing, drying)?: Total Help from another person to put on and taking off regular upper body clothing?: Total Help from another person to put on and taking off regular lower body clothing?: Total 6 Click Score: 7   End of Session Equipment Utilized During Treatment: Oxygen Nurse Communication: Mobility status  Activity Tolerance: Patient tolerated treatment well Patient left: in bed;with call bell/phone within reach;with bed alarm set;with family/visitor present;with nursing/sitter in room;with restraints reapplied (RUE mitt applied)  OT Visit Diagnosis: Unsteadiness on feet (R26.81);Other abnormalities of gait and mobility (R26.89);Muscle weakness (generalized) (M62.81);Low vision, both eyes (H54.2);Other symptoms and signs involving cognitive function;Other symptoms and signs involving the nervous system (R29.898);Cognitive communication deficit (R41.841)                Time: 1130-1200 OT Time Calculation (min): 30 min Charges:  OT General Charges $OT Visit: 1 Visit OT Evaluation $OT Eval Moderate Complexity: 1 Mod  Olivea Sonnen K, OTD, OTR/L SecureChat Preferred Acute Rehab [phone]   Laneta POUR Koonce 07/28/2023, 12:40 PM

## 2023-07-28 NOTE — Progress Notes (Signed)
 eLink Physician-Brief Progress Note Patient Name: Rachel Carlson DOB: Jul 04, 1945 MRN: [id number]   Date of Service  07/28/2023  HPI/Events of Note  Patient with acute right MCA now with midline shift attempted to switch the patient back to [facility] but she was alarming and dyssynchronous  eICU Interventions  Placed on pressure support/CPAP with improved comfort and synchronicity    Maintain pressure support unless there is a change in hemodynamics     Intervention Category Intermediate Interventions: Respiratory distress - evaluation and management  Hadasa Gasner 07/28/2023, 9:44 PM

## 2023-07-28 NOTE — Progress Notes (Signed)
Pt placed on PS/CPAP 10/5 on 40% and is tolerating well. RT will monitor. 

## 2023-07-28 NOTE — Progress Notes (Addendum)
 STROKE TEAM PROGRESS NOTE   SIGNIFICANT HOSPITAL EVENTS 6/27- Admitted to ICU, intubated   INTERIM HISTORY/SUBJECTIVE Remains intubated, propofol  drip. On wean. No family at bedside during initial rounds  Remains drowsy following commands and moving right side purposefully Bolus of hypertonic as well as rate increase to 163ml/hr overnight    OBJECTIVE  CBC    Component Value Date/Time   WBC 3.8 (L) 07/28/2023 0612   RBC 2.81 (L) 07/28/2023 0612   HGB 9.0 (L) 07/28/2023 0612   HGB 11.0 (L) 04/26/2023 1043   HCT 28.2 (L) 07/28/2023 0612   PLT 192 07/28/2023 0612   PLT 199 04/26/2023 1043   MCV 100.4 (H) 07/28/2023 0612   MCH 32.0 07/28/2023 0612   MCHC 31.9 07/28/2023 0612   RDW 16.0 (H) 07/28/2023 0612   LYMPHSABS 0.2 (L) 07/26/2023 1134   MONOABS 0.1 07/26/2023 1134   EOSABS 0.0 07/26/2023 1134   BASOSABS 0.0 07/26/2023 1134    BMET    Component Value Date/Time   NA 143 07/27/2023 2334   K 3.5 07/26/2023 1524   CL 101 07/26/2023 1140   CO2 20 (L) 07/26/2023 1134   GLUCOSE 136 (H) 07/26/2023 1140   BUN 15 07/26/2023 1140   CREATININE 0.60 07/26/2023 1140   CREATININE 0.64 04/26/2023 1043   CALCIUM  8.5 (L) 07/26/2023 1134   GFRNONAA >60 07/26/2023 1134   GFRNONAA >60 04/26/2023 1043    IMAGING past 24 hours CT HEAD POST STROKE FOLLOWUP/TIMED/STAT READ Result Date: 07/28/2023 CLINICAL DATA:  78 year old female status post code stroke presentation on 07/26/2023 with right MCA infarct. Subsequent encounter. EXAM: CT HEAD WITHOUT CONTRAST TECHNIQUE: Contiguous axial images were obtained from the base of the skull through the vertex without intravenous contrast. RADIATION DOSE REDUCTION: This exam was performed according to the departmental dose-optimization program which includes automated exposure control, adjustment of the mA and/or kV according to patient size and/or use of iterative reconstruction technique. COMPARISON:  Head CT yesterday and earlier. FINDINGS: Brain:  Confluent and increasingly hypodense cytotoxic edema in the right hemisphere in the right MCA and PCA territories, same configuration as demonstrated yesterday. No malignant hemorrhagic transformation. Mass effect on the right lateral ventricle, and there is now 2 mm of leftward midline shift (series 3, image 16). No ventriculomegaly. Basilar cisterns remain normal. Stable gray-white differentiation elsewhere. Vascular: Right MCA remains hyperdense. Skull: Stable, intact. Sinuses/Orbits: Visualized paranasal sinuses and mastoids are stable and well aerated. Other: Stable orbits and scalp. IMPRESSION: 1. Confluent Right MCA And PCA territory infarcts. No extension or malignant hemorrhagic transformation since yesterday. But subsequent intracranial mass effect has mildly progressed including 2 mm of leftward midline shift now. 2. No other acute intracranial abnormality. Electronically Signed   By: [name]   On: 07/28/2023 05:52   MR BRAIN WO CONTRAST Result Date: 07/27/2023 CLINICAL DATA:  Stroke, follow up EXAM: MRI HEAD WITHOUT CONTRAST MRA HEAD WITHOUT CONTRAST TECHNIQUE: Multiplanar, multi-echo pulse sequences of the brain and surrounding structures were acquired without intravenous contrast. Angiographic images of the Circle of Willis were acquired using MRA technique without intravenous contrast. COMPARISON:  Catheter arteriogram June 27 25, CTA head/neck and CT head June 27 25. FINDINGS: MRI HEAD FINDINGS Brain: Large acute right MCA territory infarct involving the right basal ganglia, insula and overlying frontal, parietal and anterior temporal lobes. Additional small acute infarct in the right cerebellum. Associated edema with 4 mm of leftward midline shift. Effacement the right lateral ventricle without hydrocephalus. No evidence of acute hemorrhage,  mass lesion, or extra-axial fluid collection. Patent basal cisterns. Vascular: See below. Skull and upper cervical spine: Normal marrow signal.  Sinuses/Orbits: No acute or significant finding. MRA HEAD FINDINGS Anterior circulation: Bilateral intracranial ICAs are patent. Left MCA and bilateral ACAs are patent. Persistent occlusion of the right M1 MCA. Posterior circulation: Bilateral intradural vertebral arteries, basilar artery and bilateral posterior cerebral arteries are patent. Fetal type PCAs largely supply the posterior circulation with small vertebrobasilar system, anatomic variant. IMPRESSION: 1. Large acute right MCA territory infarct with 4 mm of leftward midline shift. No acute hemorrhage. 2. Persistent occlusion of the right M1 MCA. Electronically Signed   By: [name]   On: 07/27/2023 23:33   MR ANGIO HEAD WO CONTRAST Result Date: 07/27/2023 CLINICAL DATA:  Stroke, follow up EXAM: MRI HEAD WITHOUT CONTRAST MRA HEAD WITHOUT CONTRAST TECHNIQUE: Multiplanar, multi-echo pulse sequences of the brain and surrounding structures were acquired without intravenous contrast. Angiographic images of the Circle of Willis were acquired using MRA technique without intravenous contrast. COMPARISON:  Catheter arteriogram June 27 25, CTA head/neck and CT head June 27 25. FINDINGS: MRI HEAD FINDINGS Brain: Large acute right MCA territory infarct involving the right basal ganglia, insula and overlying frontal, parietal and anterior temporal lobes. Additional small acute infarct in the right cerebellum. Associated edema with 4 mm of leftward midline shift. Effacement the right lateral ventricle without hydrocephalus. No evidence of acute hemorrhage, mass lesion, or extra-axial fluid collection. Patent basal cisterns. Vascular: See below. Skull and upper cervical spine: Normal marrow signal. Sinuses/Orbits: No acute or significant finding. MRA HEAD FINDINGS Anterior circulation: Bilateral intracranial ICAs are patent. Left MCA and bilateral ACAs are patent. Persistent occlusion of the right M1 MCA. Posterior circulation: Bilateral intradural  vertebral arteries, basilar artery and bilateral posterior cerebral arteries are patent. Fetal type PCAs largely supply the posterior circulation with small vertebrobasilar system, anatomic variant. IMPRESSION: 1. Large acute right MCA territory infarct with 4 mm of leftward midline shift. No acute hemorrhage. 2. Persistent occlusion of the right M1 MCA. Electronically Signed   By: [name]   On: 07/27/2023 23:33   ECHOCARDIOGRAM COMPLETE Result Date: 07/27/2023    ECHOCARDIOGRAM REPORT   Patient Name:   Rachel Carlson Date of Exam: 07/27/2023 Medical Rec #:  [id number]           Height:       62.0 in Accession #:    [id number]          Weight:       162.0 lb Date of Birth:  May 24, 1945          BSA:          1.748 m Patient Age:    77 years            BP:           126/60 mmHg Patient Gender: F                   HR:           92 bpm. Exam Location:  Inpatient Procedure: 2D Echo (Both Spectral and Color Flow Doppler were utilized during            procedure). Indications:    stroke  History:        Patient has prior history of Echocardiogram examinations, most                 recent 04/27/2021. Arrythmias:PVC, Signs/Symptoms:Edema; Risk  Factors:Hypertension, Dyslipidemia and Family History of                 Coronary Artery Disease.  Sonographer:    Tinnie Barefoot RDCS Referring Phys: [id number] [facility]  Sonographer Comments: Echo performed with patient supine and on artificial respirator. IMPRESSIONS  1. Left ventricular ejection fraction, by estimation, is 60 to 65%. The left ventricle has normal function. The left ventricle has no regional wall motion abnormalities. Indeterminate diastolic filling due to E-A fusion.  2. Right ventricular systolic function is normal. The right ventricular size is normal. There is normal pulmonary artery systolic pressure. The estimated right ventricular systolic pressure is 35.3 mmHg.  3. The mitral valve is grossly normal. Mild mitral valve  regurgitation. No evidence of mitral stenosis.  4. The aortic valve is tricuspid. Aortic valve regurgitation is not visualized. No aortic stenosis is present.  5. The inferior vena cava is normal in size with greater than 50% respiratory variability, suggesting right atrial pressure of 3 mmHg. Comparison(s): No significant change from prior study. Conclusion(s)/Recommendation(s): No intracardiac source of embolism detected on this transthoracic study. Consider a transesophageal echocardiogram to exclude cardiac source of embolism if clinically indicated. FINDINGS  Left Ventricle: Left ventricular ejection fraction, by estimation, is 60 to 65%. The left ventricle has normal function. The left ventricle has no regional wall motion abnormalities. The left ventricular internal cavity size was normal in size. There is  no left ventricular hypertrophy. Indeterminate diastolic filling due to E-A fusion. Right Ventricle: The right ventricular size is normal. No increase in right ventricular wall thickness. Right ventricular systolic function is normal. There is normal pulmonary artery systolic pressure. The tricuspid regurgitant velocity is 2.84 m/s, and  with an assumed right atrial pressure of 3 mmHg, the estimated right ventricular systolic pressure is 35.3 mmHg. Left Atrium: Left atrial size was normal in size. Right Atrium: Right atrial size was normal in size. Pericardium: Trivial pericardial effusion is present. Mitral Valve: The mitral valve is grossly normal. Mild mitral valve regurgitation. No evidence of mitral valve stenosis. Tricuspid Valve: The tricuspid valve is grossly normal. Tricuspid valve regurgitation is mild . No evidence of tricuspid stenosis. Aortic Valve: The aortic valve is tricuspid. Aortic valve regurgitation is not visualized. No aortic stenosis is present. Pulmonic Valve: The pulmonic valve was grossly normal. Pulmonic valve regurgitation is not visualized. No evidence of pulmonic stenosis.  Aorta: The aortic root and ascending aorta are structurally normal, with no evidence of dilitation. Venous: The inferior vena cava is normal in size with greater than 50% respiratory variability, suggesting right atrial pressure of 3 mmHg. IAS/Shunts: The atrial septum is grossly normal. Additional Comments: There is a small pleural effusion in the left lateral region.  LEFT VENTRICLE PLAX 2D LVIDd:         4.30 cm   Diastology LVIDs:         2.70 cm   LV e' medial:    8.92 cm/s LV PW:         0.80 cm   LV E/e' medial:  10.2 LV IVS:        0.90 cm   LV e' lateral:   9.68 cm/s LVOT diam:     1.70 cm   LV E/e' lateral: 9.4 LV SV:         51 LV SV Index:   29 LVOT Area:     2.27 cm  RIGHT VENTRICLE  IVC RV Basal diam:  3.00 cm     IVC diam: 2.20 cm RV S prime:     15.00 cm/s TAPSE (M-mode): 1.8 cm LEFT ATRIUM             Index        RIGHT ATRIUM           Index LA diam:        2.50 cm 1.43 cm/m   RA Area:     12.20 cm LA Vol (A2C):   33.5 ml 19.16 ml/m  RA Volume:   28.00 ml  16.02 ml/m LA Vol (A4C):   37.7 ml 21.57 ml/m LA Biplane Vol: 37.0 ml 21.17 ml/m  AORTIC VALVE LVOT Vmax:   112.00 cm/s LVOT Vmean:  76.000 cm/s LVOT VTI:    0.223 m  AORTA Ao Root diam: 2.70 cm Ao Asc diam:  3.30 cm MITRAL VALVE                TRICUSPID VALVE MV Area (PHT): 5.02 cm     TR Peak grad:   32.3 mmHg MV Decel Time: 151 msec     TR Vmax:        284.00 cm/s MV E velocity: 90.70 cm/s MV A velocity: 102.00 cm/s  SHUNTS MV E/A ratio:  0.89         Systemic VTI:  0.22 m                             Systemic Diam: 1.70 cm Darryle Decent MD Electronically signed by Darryle Decent MD Signature Date/Time: 07/27/2023/5:51:05 PM    Final    US  EKG SITE RITE Result Date: 07/27/2023 If Site Rite image not attached, placement could not be confirmed due to current cardiac rhythm.  CT HEAD WO CONTRAST ( ) Result Date: 07/27/2023 CLINICAL DATA:  78 year old female status post code stroke presentation yesterday with right MCA  infarct. Subsequent encounter. EXAM: CT HEAD WITHOUT CONTRAST TECHNIQUE: Contiguous axial images were obtained from the base of the skull through the vertex without intravenous contrast. RADIATION DOSE REDUCTION: This exam was performed according to the departmental dose-optimization program which includes automated exposure control, adjustment of the mA and/or kV according to patient size and/or use of iterative reconstruction technique. COMPARISON:  Head CT 1144 hours yesterday. FINDINGS: Brain: Confluent right hemisphere cytotoxic edema has developed throughout most of the right MCA and right PCA vascular territories (series 3, image 19). No malignant hemorrhagic transformation. Mild mass effect on the right lateral ventricle with no midline shift. Basilar cisterns remain patent. Stable gray-white differentiation elsewhere. Vascular: Stable. Skull: Stable and intact. Sinuses/Orbits: Visualized paranasal sinuses and mastoids are stable and well aerated. Other: No acute orbit or scalp soft tissue finding. IMPRESSION: Confluent Right hemisphere cytotoxic edema has developed in both the Right MCA and Right PCA vascular territories. No malignant hemorrhagic transformation. Mild mass effect on the right lateral ventricle with no midline shift. Electronically Signed   By: [name]   On: 07/27/2023 10:00    Vitals:   07/28/23 0500 07/28/23 0600 07/28/23 0700 07/28/23 0814  BP: 130/80 (!) 148/83 (!) 143/67   Pulse: 94 97 91   Resp: (!) 30 (!) 29 (!) 27   Temp:      TempSrc:      SpO2: 96% 96% 98% 100%  Weight:      Height:         PHYSICAL  EXAM General: Intubated well-nourished, well-developed elderly Caucasian lady in no acute distress Psych:  Mood and affect appropriate for situation CV: Regular rate and rhythm on monitor Respiratory:  Regular, unlabored respirations on room air GI: Abdomen soft and nontender   NEURO:  Mental Status: Patient is sedated and intubated.  She opens eyes to  stimulation and follows some commands on the right side and midline.   Cranial Nerves:  II: PERRL. Does not blink to threat on the left III, IV, VI: Moves slightly past midline  V: Sensation is intact to light touch and symmetrical to face.  VII: Face is symmetrical resting and smiling VIII: hearing intact to voice. IX, X: Cough and gag  KP:Dynloizm shrug 5/5. XII: tongue is midline without fasciculations. Motor: Follows commands on the right upper and lower extremity  Responds to pain on the left Tone: is normal and bulk is normal Sensation- Intact to light touch bilaterally. Extinction absent to light touch to DSS.   Coordination: FTN intact bilaterally, HKS: no ataxia in BLE.No drift.  Gait- deferred       ASSESSMENT/PLAN  Rachel Carlson is a 78 y.o. 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 pfo Code Stroke CT head- Acute right MCA infarct.  ASPECTS of 7. No intracranial hemorrhage. CTA head & neck - Occlusion of the proximal/mid M1 segment right MCA with intraluminal filling defect concerning for thrombus. Few diminutive caliber M2 and M3 branches noted likely filled via collaterals. CT perfusion Core 62 Penumbra 132  Repeat head CT - Confluent Right hemisphere cytotoxic edema has developed in both the right MCA and Right PCA vascular territories. No malignant hemorrhagic transformation. Mild mass effect on the right lateral ventricle with no midline shift. 6/29 - Repeat CT Head - Confluent Right MCA And  PCA territory infarcts. No extension or malignant hemorrhagic transformation since yesterday. But subsequent intracranial mass effect has mildly progressed including 2 mm of leftward midline shift now. MRI Large acute right MCA territory infarct with 4 mm of leftward midline shift. No acute hemorrhage. MRA Head- Persistent occlusion of the right M1 MCA.  2D Echo ejection fraction 60 to 65%.  Left atrial size normal.  No right-to-left shunt LDL 61 HgbA1c 6.3 VTE prophylaxis - heparin  IV Eliquis  (apixaban ) daily prior to admission, now on heparin  IV  Therapy recommendations:  Pending Disposition:  Pending   Cerebral cytotoxic edema with brain compression HTS at 80ml/hr -> 1101ml/hr 6/28 250cc bolus 6/29 250cc bolus  Na 138 - 151 Na q 6 per protocol   Procedural intubation  Hypoxemic respiratory failure Hx of malignant pleural effusion PCCM consulted SBT/extubation per CCM CXR: Large left and small right pleural effusions.   Hypertension Stable Blood Pressure Goal: BP less than 180/105   Hyperlipidemia Home meds:  Crestor  20mg , resumed in hospital LDL 61, goal < 70 Continue statin at discharge  Dysphagia Patient has post-stroke dysphagia, SLP consulted    Diet   Diet NPO time specified   Advance diet as tolerated  Peritoneal carcinomatosis undergoing chemo Follows with Dr.  Katragadda at the [facility] Current protocol: niraparib  200 mg daily.  Gyn Onc-  Dr. Viktoria s/p omenectomy small bowel resection, s/p colostomy with colostomy prolapse   Hx left gastrocnemius/calf vein distal DVT Eliquis  5mg  BID No evidence of DVT  Hospital day # 2  Patient seen and examined by NP/APP with MD. MD to update note as needed.   Jorene Last, DNP, FNP-BC [facility] Pager: [phone]  I have personally obtained history,examined this patient, reviewed notes, independently viewed imaging studies, participated in medical decision making and plan of  care.ROS completed by me personally and pertinent positives fully documented  I have made any additions or clarifications directly to the above note. Agree with note above.  Patient remains sedated and intubated for respiratory failure and cytotoxic edema from large right MCA infarct.  Continue hypertonic saline with serum sodium goal between 150-155.  Continue IV heparin  for stroke prevention given history of recent DVT and possible hypercoagulability from cancer.  Long discussion at the bedside with the patient's husband, son and daughter about the prognosis and plan of care and answered questions.  Recommend palliative care consult to discuss with family goals of care as patient is likely to need prolonged ventilatory support, tracheostomy, PEG tube and feeding tube and rehabilitation in the nursing home which patient may or may not want This patient is critically ill and at significant risk of neurological worsening, death and care requires constant monitoring of vital signs, hemodynamics,respiratory and cardiac monitoring, extensive review of multiple databases, frequent neurological assessment, discussion with family, other specialists and medical decision making of high complexity.I have made any additions or clarifications directly to the above note.This critical care time does not reflect procedure time, or teaching time or supervisory time of PA/NP/Med Resident etc but could involve care discussion time.  I spent 30 minutes of neurocritical care time  in the care of  this patient.     Eather Popp, MD Medical Director [facility] Pager: [phone] 07/28/2023 2:56 PM   To contact Stroke Continuity provider, please refer to [email or web address]. After hours, contact General Neurology

## 2023-07-28 NOTE — Progress Notes (Signed)
Pt transported from 4N32 to CT and back with no complications.  

## 2023-07-28 NOTE — Progress Notes (Signed)
 PHARMACY - ANTICOAGULATION CONSULT NOTE  Pharmacy Consult:  Heparin  Indication:  History of DVT, new CVA  Allergies  Allergen Reactions   Aspartame Other (See Comments)    Intolerance to artifical sweeteners, pt states caused burning of bladder   Levaquin  [Levofloxacin ] Nausea Only   Pantoprazole  Sodium Diarrhea    Abdominal pain   Ranitidine Diarrhea and Other (See Comments)    Abdominal pain, also   Omeprazole  Diarrhea and Other (See Comments)    Abdominal pain, also   Sulfasalazine  Rash and Other (See Comments)    Fever, chills, headache, muscle pain also    Patient Measurements: Height: 5' 2 (157.5 cm) Weight: 73.5 kg (162 lb 0.6 oz) IBW/kg (Calculated) : 50.1 HEPARIN  DW (KG): 66  Vital Signs: Temp: 100.8 F (Rachel.2 C) (06/29 0800) Temp Source: Axillary (06/29 0800) BP: 148/72 (06/29 0800) Pulse Rate: 97 (06/29 0800)  Labs: Recent Labs    07/26/23 1134 07/26/23 1140 07/26/23 1524 07/27/23 2144 07/28/23 0612  HGB 9.8* 11.9* 9.2*  --  9.0*  HCT 29.1* 35.0* 27.0*  --  28.2*  PLT 164  --   --   --  192  APTT 30  --   --  49* 65*  LABPROT 15.3*  --   --   --   --   INR 1.2  --   --   --   --   HEPARINUNFRC  --   --   --  0.63 0.76*  CREATININE 0.50 0.60  --   --   --     Estimated Creatinine Clearance: 57.1 mL/min (by C-G formula based on SCr of 0.6 mg/dL).   Assessment: Rachel Carlson with history of DVT on Eliquis  PTA, last dose on 6/26 at 2100.  Patient presented as a code stroke and thrombectomy was unsuccessful.  Pharmacy consulted to dose IV heparin  per stroke protocol.    CT showed no extension or malignant hemorrhagic since prior CT; mass effect mildly progressed.  aPTT slightly below goal at 65 sec; heparin  level elevated due to effect of Eliquis .  CBC stable; no overt bleeding reported.  Goal of Therapy:  Heparin  level 0.3-0.5 units/ml aPTT 66-85 seconds Monitor platelets by anticoagulation protocol: Yes   Plan:  No bolus Increase IV heparin  to 950  units/hr Check 6 hr heparin  level  Daily heparin  level, aPTT and CBC  Ancil Dewan D. Lendell, PharmD, BCPS, BCCCP 07/28/2023, 9:27 AM

## 2023-07-28 NOTE — Progress Notes (Signed)
 2330 sodium level dropped from 145 now to 143. Dr. Jerrie made aware, new order to give a bolus of 3% and increase continuous dose from 75ml/hr to 125ml/hr

## 2023-07-29 ENCOUNTER — Other Ambulatory Visit: Payer: Self-pay

## 2023-07-29 ENCOUNTER — Encounter (HOSPITAL_COMMUNITY): Payer: Self-pay | Admitting: Radiology

## 2023-07-29 ENCOUNTER — Inpatient Hospital Stay (HOSPITAL_COMMUNITY)

## 2023-07-29 DIAGNOSIS — J910 Malignant pleural effusion: Secondary | ICD-10-CM

## 2023-07-29 DIAGNOSIS — J9601 Acute respiratory failure with hypoxia: Secondary | ICD-10-CM

## 2023-07-29 DIAGNOSIS — Z978 Presence of other specified devices: Secondary | ICD-10-CM | POA: Diagnosis not present

## 2023-07-29 DIAGNOSIS — I63411 Cerebral infarction due to embolism of right middle cerebral artery: Secondary | ICD-10-CM | POA: Diagnosis not present

## 2023-07-29 DIAGNOSIS — I63431 Cerebral infarction due to embolism of right posterior cerebral artery: Secondary | ICD-10-CM | POA: Diagnosis not present

## 2023-07-29 DIAGNOSIS — G935 Compression of brain: Secondary | ICD-10-CM | POA: Diagnosis not present

## 2023-07-29 DIAGNOSIS — C55 Malignant neoplasm of uterus, part unspecified: Secondary | ICD-10-CM

## 2023-07-29 DIAGNOSIS — D6859 Other primary thrombophilia: Secondary | ICD-10-CM | POA: Diagnosis not present

## 2023-07-29 DIAGNOSIS — C786 Secondary malignant neoplasm of retroperitoneum and peritoneum: Secondary | ICD-10-CM | POA: Diagnosis not present

## 2023-07-29 LAB — POCT I-STAT 7, (LYTES, BLD GAS, ICA,H+H)
Acid-base deficit: 2 mmol/L (ref 0.0–2.0)
Bicarbonate: 21.9 mmol/L (ref 20.0–28.0)
Calcium, Ion: 1.32 mmol/L (ref 1.15–1.40)
HCT: 21 % — ABNORMAL LOW (ref 36.0–46.0)
Hemoglobin: 7.1 g/dL — ABNORMAL LOW (ref 12.0–15.0)
O2 Saturation: 97 %
Patient temperature: 99
Potassium: 3.5 mmol/L (ref 3.5–5.1)
Sodium: 154 mmol/L — ABNORMAL HIGH (ref 135–145)
TCO2: 23 mmol/L (ref 22–32)
pCO2 arterial: 34.7 mmHg (ref 32–48)
pH, Arterial: 7.408 (ref 7.35–7.45)
pO2, Arterial: 87 mmHg (ref 83–108)

## 2023-07-29 LAB — BASIC METABOLIC PANEL WITH GFR
Anion gap: 10 (ref 5–15)
BUN: 9 mg/dL (ref 8–23)
CO2: 21 mmol/L — ABNORMAL LOW (ref 22–32)
Calcium: 8.9 mg/dL (ref 8.9–10.3)
Chloride: 118 mmol/L — ABNORMAL HIGH (ref 98–111)
Creatinine, Ser: 0.41 mg/dL — ABNORMAL LOW (ref 0.44–1.00)
GFR, Estimated: >60 mL/min (ref >60)
Glucose, Bld: 133 mg/dL — ABNORMAL HIGH (ref 70–99)
Potassium: 2.8 mmol/L — ABNORMAL LOW (ref 3.5–5.1)
Sodium: 149 mmol/L — ABNORMAL HIGH (ref 135–145)

## 2023-07-29 LAB — APTT: aPTT: 82 s — ABNORMAL HIGH (ref 24–36)

## 2023-07-29 LAB — HEPARIN LEVEL (UNFRACTIONATED): Heparin Unfractionated: 0.69 [IU]/mL (ref 0.30–0.70)

## 2023-07-29 LAB — CBC
HCT: 26.8 % — ABNORMAL LOW (ref 36.0–46.0)
Hemoglobin: 8.5 g/dL — ABNORMAL LOW (ref 12.0–15.0)
MCH: 31.8 pg (ref 26.0–34.0)
MCHC: 31.7 g/dL (ref 30.0–36.0)
MCV: 100.4 fL — ABNORMAL HIGH (ref 80.0–100.0)
Platelets: 192 10*3/uL (ref 150–400)
RBC: 2.67 MIL/uL — ABNORMAL LOW (ref 3.87–5.11)
RDW: 16.5 % — ABNORMAL HIGH (ref 11.5–15.5)
WBC: 3.6 10*3/uL — ABNORMAL LOW (ref 4.0–10.5)
nRBC: 0 % (ref 0.0–0.2)

## 2023-07-29 LAB — GLUCOSE, CAPILLARY
Glucose-Capillary: 132 mg/dL — ABNORMAL HIGH (ref 70–99)
Glucose-Capillary: 157 mg/dL — ABNORMAL HIGH (ref 70–99)
Glucose-Capillary: 132 mg/dL — ABNORMAL HIGH (ref 70–99)
Glucose-Capillary: 140 mg/dL — ABNORMAL HIGH (ref 70–99)
Glucose-Capillary: 148 mg/dL — ABNORMAL HIGH (ref 70–99)

## 2023-07-29 LAB — SODIUM
Sodium: 148 mmol/L — ABNORMAL HIGH (ref 135–145)
Sodium: 152 mmol/L — ABNORMAL HIGH (ref 135–145)
Sodium: 155 mmol/L — ABNORMAL HIGH (ref 135–145)

## 2023-07-29 MED ORDER — SENNOSIDES-DOCUSATE SODIUM 8.6-50 MG PO TABS: 1.0000 | ORAL_TABLET | Freq: Every evening | ORAL | Status: DC | PRN

## 2023-07-29 MED ORDER — POTASSIUM CHLORIDE 20 MEQ PO PACK
40.0000 meq | PACK | Freq: Four times a day (QID) | ORAL | Status: AC
  Administered 2023-07-29 (×2): 40 meq
  Filled 2023-07-29 (×2): qty 2

## 2023-07-29 NOTE — Consult Note (Signed)
 WOC Nurse ostomy consult note Stoma type/location: LLQ prolapsed colostomy Stomal assessment/size: 1 1/2 diameter, protrudes 2.5 cm pink and moist  Peristomal assessment: intact  Treatment options for stomal/peristomal skin: barrier ring, cut opening to 2  Output soft brown stool Ostomy pouching: 1pc. Flat with barrier ring  (LAWSON # [id number]) on unit.  Barrier ring (LASON # [id number]) on unit Education provided: None  patient is intubated and sedated No family at bedside.  Enrolled patient in [employer] DC program: Yes previously Will not follow at this time.  Please re-consult if needed.  Rachel Cooley MSN, RN, FNP-BC [facility] Wound, Ostomy, Continence Nurse Outpatient [facility] [phone] Pager [phone]

## 2023-07-29 NOTE — Anesthesia Postprocedure Evaluation (Signed)
 Anesthesia Post Note  Patient: Rachel Carlson  Procedure(s) Performed: RADIOLOGY WITH ANESTHESIA     Patient location during evaluation: SICU Anesthesia Type: General Level of consciousness: sedated Pain management: pain level controlled Vital Signs Assessment: post-procedure vital signs reviewed and stable Respiratory status: patient remains intubated per anesthesia plan Cardiovascular status: stable Postop Assessment: no apparent nausea or vomiting Anesthetic complications: no   No notable events documented.  Last Vitals:  Vitals:   07/29/23 0900 07/29/23 1000  BP: (!) 143/82 (!) 146/80  Pulse: 82 97  Resp: 14 (!) 30  Temp: 37.2 C   SpO2: 98% 97%    Last Pain:  Vitals:   07/29/23 0900  TempSrc: Axillary  PainSc:                  Trustin Chapa S

## 2023-07-29 NOTE — Progress Notes (Signed)
 Pt switched back to full vent support to rest for the night

## 2023-07-29 NOTE — Progress Notes (Signed)
 Pt given pain meds by RN, placed back on documented full support setting for adequate ventilation.

## 2023-07-29 NOTE — Progress Notes (Addendum)
 STROKE TEAM PROGRESS NOTE   SIGNIFICANT HOSPITAL EVENTS 6/27- Admitted to ICU, intubated   INTERIM HISTORY/SUBJECTIVE Patient has been hemodynamically stable overnight with Tmax of 101.3.  WBC count is actually low, but given persistent fevers and thick secretions, will obtain urinalysis, sputum culture and chest x-ray.  Hypertonic saline rate was increased to 85 cc/h, and sodium is therapeutic at 152.  OBJECTIVE  CBC    Component Value Date/Time   WBC 3.6 (L) 07/29/2023 0912   RBC 2.67 (L) 07/29/2023 0912   HGB 7.1 (L) 07/29/2023 1127   HGB 11.0 (L) 04/26/2023 1043   HCT 21.0 (L) 07/29/2023 1127   PLT 192 07/29/2023 0912   PLT 199 04/26/2023 1043   MCV 100.4 (H) 07/29/2023 0912   MCH 31.8 07/29/2023 0912   MCHC 31.7 07/29/2023 0912   RDW 16.5 (H) 07/29/2023 0912   LYMPHSABS 0.2 (L) 07/26/2023 1134   MONOABS 0.1 07/26/2023 1134   EOSABS 0.0 07/26/2023 1134   BASOSABS 0.0 07/26/2023 1134    BMET    Component Value Date/Time   NA 152 (H) 07/29/2023 1134   K 3.5 07/29/2023 1127   CL 118 (H) 07/29/2023 0534   CO2 21 (L) 07/29/2023 0534   GLUCOSE 133 (H) 07/29/2023 0534   BUN 9 07/29/2023 0534   CREATININE 0.41 (L) 07/29/2023 0534   CREATININE 0.64 04/26/2023 1043   CALCIUM  8.9 07/29/2023 0534   GFRNONAA >60 07/29/2023 0534   GFRNONAA >60 04/26/2023 1043    IMAGING past 24 hours No results found.   Vitals:   07/29/23 1200 07/29/23 1300 07/29/23 1400 07/29/23 1500  BP: 135/74 132/70 137/68 (!) 141/63  Pulse: 83 88 89 87  Resp: (!) 25 (!) 21 (!) 25 (!) 22  Temp:      TempSrc:      SpO2: 97% 95% 97% 98%  Weight:      Height:         PHYSICAL EXAM General: Intubated well-nourished, well-developed elderly Caucasian lady in no acute distress CV: Regular rate and rhythm on monitor Respiratory: Respirations synchronous with ventilator   NEURO:  Mental Status: Patient is sedated and intubated.  Pupils are equal round and reactive to light, cough reflex is  intact, patient will follow commands with right lower extremity and localizes with right upper extremity.  She moves bilateral upper extremities with antigravity strength and withdraws bilateral lower extremities to noxious  ASSESSMENT/PLAN  [name] is a 78 y.o. 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 pfo Code Stroke CT head- Acute right MCA infarct.  ASPECTS of 7. No intracranial hemorrhage. CTA head & neck - Occlusion of the proximal/mid M1 segment right MCA with intraluminal filling defect concerning for thrombus. Few diminutive caliber M2 and M3 branches noted likely filled via collaterals. CT perfusion Core 62 Penumbra 132  Repeat head CT - Confluent Right hemisphere cytotoxic edema has developed in both the right MCA and Right PCA vascular territories. No malignant hemorrhagic transformation. Mild mass effect on the right lateral ventricle with no midline shift.  6/29 - Repeat CT Head - Confluent Right MCA And PCA territory infarcts. No extension or malignant hemorrhagic transformation since yesterday. But subsequent intracranial mass effect has mildly progressed including 2 mm of leftward midline shift now. MRI Large acute right MCA territory infarct with 4 mm of leftward midline shift. No acute hemorrhage. MRA Head- Persistent occlusion of the right M1 MCA.  2D Echo ejection fraction 60 to 65%.  Left atrial size normal.  No right-to-left shunt LDL 61 HgbA1c 6.3 VTE prophylaxis - heparin  IV Eliquis   (apixaban ) daily prior to admission, now on heparin  IV  Therapy recommendations:  Pending Disposition:  Pending   Cerebral cytotoxic edema with brain compression HTS at 61ml/hr -> 1100ml/hr 6/28 250cc bolus 6/29 250cc bolus  Na 138 - 151 Na q 6 per protocol   Procedural intubation  Hypoxemic respiratory failure Hx of malignant pleural effusion PCCM consulted SBT/extubation per CCM CXR: Large left and small right pleural effusions.   Hypertension Stable Blood Pressure Goal: BP less than 180/105   Hyperlipidemia Home meds:  Crestor  20mg , resumed in hospital LDL 61, goal < 70 Continue statin at discharge  Dysphagia Patient has post-stroke dysphagia, SLP consulted    Diet   Diet NPO time specified   Advance diet as tolerated  Peritoneal carcinomatosis undergoing chemo Follows with Dr. Katragadda at the [facility] Current protocol: niraparib  200 mg daily.  Gyn Onc-  Dr. Viktoria s/p omenectomy small bowel resection, s/p colostomy with colostomy prolapse   Hx left gastrocnemius/calf vein distal DVT Eliquis  5mg  BID No evidence of DVT  Fever  Tmax 101.3 WBC 3.6 RN reports thick secretions Will order chest x-ray, urinalysis and sputum culture  Hospital day # 3  Patient seen and examined by NP/APP with MD. MD to update note as needed.   Rachel Carlson Rachel Carlson , MSN, AGACNP-BC [facility] See Amion for schedule and pager information 07/29/2023 3:33 PM  I have personally obtained history,examined this patient, reviewed notes, independently viewed imaging studies, participated in medical decision making and plan of care.ROS completed by me personally and pertinent positives fully documented  I have made any additions or clarifications directly to the above note. Agree with note above.  Patient remains sedated and intubated for respiratory failure but can be aroused and follows commands in the right side.  Remains on hypertonic saline and serum sodium  is at goal.  Remains on IV heparin .  No family at the bedside.  Continue ventilator support as per critical care team.  Plan to repeat CT head tomorrow morning and if cerebral edema is stable may consider weaning hypertonic saline.  Discussed with pulmonary critical care team This patient is critically ill and at significant risk of neurological worsening, death and care requires constant monitoring of vital signs, hemodynamics,respiratory and cardiac monitoring, extensive review of multiple databases, frequent neurological assessment, discussion with family, other specialists and medical decision making of high complexity.I have made any additions or clarifications directly to the above note.This critical care time does not reflect procedure time, or teaching time or supervisory time of PA/NP/Med Resident etc but could involve care discussion time.  I spent 30 minutes of neurocritical care time  in the care of  this patient.      Rachel Popp, MD Medical Director [facility] Stroke [facility] Pager: [phone] 07/29/2023 3:34 PM  To contact Stroke Continuity provider, please refer to [email or web address]. After hours, contact General Neurology

## 2023-07-29 NOTE — TOC Initial Note (Signed)
 Transition of Care [facility]) - Initial/Assessment Note    Patient Details  Name: Rachel Carlson MRN: [id number] Date of Birth: 03-07-1945  Transition of [facility]) CM/SW Contact:    Inocente GORMAN Kindle, LCSW Phone Number: 07/29/2023, 12:39 PM  Clinical Narrative:                 Patient admitted from home with son and spouse and is currently intubated. 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 CSW will continue to follow for SNF workup once medically appropriate.    Expected Discharge Plan: Skilled Nursing Facility Barriers to Discharge: Continued Medical Work up, English as a second language teacher, SNF Pending bed offer   Patient Goals and CMS Choice            Expected Discharge Plan and Services In-house Referral: Clinical Social Work     Living arrangements for the past 2 months: Single Family Home                                      Prior Living Arrangements/Services Living arrangements for the past 2 months: Single Family Home Lives with:: Adult Children, Spouse Patient language and need for interpreter reviewed:: Yes        Need for Family Participation in Patient Care: Yes (Comment) Care giver support system in place?: Yes (comment) Current home services: DME [facility]) Criminal Activity/Legal Involvement Pertinent to Current Situation/Hospitalization: No - Comment as needed  Activities of Daily Living      Permission Sought/Granted Permission sought to share information with : Facility Medical sales representative, Family Supports Permission granted to share information with : No  Share Information with NAME: Franky     Permission granted to share info w Relationship: Son  Permission granted to share info w Contact Information: [phone]  Emotional Assessment Appearance:: Appears stated age Attitude/Demeanor/Rapport: Unable to Assess, Intubated (Following Commands or Not Following Commands) Affect (typically observed): Unable to Assess Orientation: :   (intubated) Alcohol  / Substance Use: Not Applicable Psych Involvement: No (comment)  Admission diagnosis:  Stroke [facility]) [I63.9] Patient Active Problem List   Diagnosis Date Noted   Stroke (HCC) 07/26/2023   Pre-operative clearance 07/23/2023   Nausea and vomiting 05/19/2023   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   Dysuria 05/19/2023   Partial small bowel obstruction (HCC) 05/16/2023   Hypokalemia 05/16/2023   Bilateral inguinal hernia without obstruction or gangrene 05/08/2023   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   Colostomy complication (HCC) 04/03/2023   History of colonic polyps 04/03/2023   Colostomy care (HCC) 01/24/2023   Nonhealing nonsurgical wound limited to breakdown of skin 01/24/2023   Irritant contact dermatitis associated with fecal stoma 01/24/2023   Wound infection after surgery 01/02/2023   Postoperative infection 01/01/2023   Erythema of wound 12/26/2022   Gynecologic malignancy (HCC) 12/12/2022   Iron deficiency anemia 10/25/2022   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   Elevated tumor markers 08/31/2022   History of DVT (deep vein thrombosis) 08/01/2022   Ascites 07/27/2022   Left leg swelling 07/23/2022   Swelling abdomen 07/23/2022   History of vaginal bleeding 08/17/2021   Nonrheumatic mitral valve regurgitation 04/16/2019   Rash 08/07/2018   Essential hypertension 04/11/2018   PVC's (premature ventricular contractions) 04/11/2018   Class 1 obesity due to excess calories with serious comorbidity and body mass index (BMI) of 34.0 to 34.9 in adult 12/06/2017   Genetic testing 09/26/2017   Family history  of breast cancer    Family history of colon cancer    Family history of prostate cancer    Family history of melanoma    Family history of Lynch syndrome - son 08/04/2017   Prediabetes 07/20/2015   Estrogen deficiency 04/27/2015   GERD (gastroesophageal reflux disease) 03/23/2015   Hx of adenomatous polyp of colon 10/19/2014    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   Pain in joint, pelvic region and thigh 07/24/2014   Need for hepatitis C screening test 07/12/2014   Pelvic pain in female 07/12/2014   Vitamin D  deficiency 07/03/2014   Encounter for Medicare annual wellness exam 07/08/2013   Osteopenia 05/24/2013   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   Detrusor muscle hypertonia 11/15/2010   Adnexal pain 11/15/2010   Urge incontinence 11/15/2010   Family history of coronary artery disease 10/15/2010   Routine general medical examination at a health care facility 06/01/2010   FASCIITIS, PLANTAR 06/06/2009   Hyperlipidemia 06/03/2007   Urge urinary incontinence 06/03/2007   MIGRAINES, HX OF 06/03/2007   PCP:  Randeen Laine LABOR, MD Pharmacy:   [facility] [address] [place] - [address] [place] [address] Phone: [phone] Fax: [phone]     Social Drivers of Health (SDOH) Social History: SDOH Screenings   Food Insecurity: Patient Unable To Answer (07/26/2023)  Housing: Unknown (07/01/2023)   Received from [facility]  Transportation Needs: No Transportation Needs (05/16/2023)  Utilities: Not At Risk (05/16/2023)  Alcohol  Screen: Low Risk  (08/28/2022)  Depression (PHQ2-9): Low Risk  (07/23/2023)  Financial Resource Strain: Low Risk  (08/28/2022)  Physical Activity: Inactive (08/28/2022)  Social Connections: Moderately Integrated (05/16/2023)  Stress: No Stress Concern Present (08/28/2022)  Tobacco Use: Low Risk  (07/23/2023)  Health Literacy: Adequate Health Literacy (08/28/2022)   SDOH Interventions:     Readmission Risk Interventions    07/29/2023   12:37 PM 06/01/2023    2:31 PM 05/17/2023    1:19 PM  Readmission Risk Prevention Plan  Transportation Screening Complete Complete Complete  PCP or Specialist Appt within 3-5 Days   Complete  HRI or Home Care Consult   Complete  Social Work Consult for Recovery Care  Planning/Counseling   Complete  Palliative Care Screening   Not Applicable  Medication Review Oceanographer) Complete Complete Complete  PCP or Specialist appointment within 3-5 days of discharge Complete Complete   HRI or Home Care Consult Complete Complete   SW Recovery Care/Counseling Consult Complete Complete   Palliative Care Screening Not Applicable Complete   Skilled Nursing Facility Complete Not Applicable

## 2023-07-29 NOTE — Progress Notes (Signed)
 PCCM INTERVAL PROGRESS NOTE   Per RN with sedation off patient was very uncomfortable, but was able to nod yes/no to answer questions and purposefully moving the right upper and lower extremities. No appreciable movement on the left. Did not tolerate SBT during this time due to tachypnea and indicated she was having pain, which was treated per PAD protocol.     Deward Eastern, AGACNP-BC [facility]  See Amion for personal pager PCCM on call pager [phone] until 7pm. Please call Elink 7p-7a. [phone]  07/29/2023 3:23 PM

## 2023-07-29 NOTE — Progress Notes (Signed)
 PHARMACY - ANTICOAGULATION CONSULT NOTE  Pharmacy Consult:  Heparin  Indication:  History of DVT, new CVA  Allergies  Allergen Reactions   Aspartame Other (See Comments)    Intolerance to artifical sweeteners, pt states caused burning of bladder   Levaquin  [Levofloxacin ] Nausea Only   Pantoprazole  Sodium Diarrhea    Abdominal pain   Ranitidine Diarrhea and Other (See Comments)    Abdominal pain, also   Omeprazole  Diarrhea and Other (See Comments)    Abdominal pain, also   Sulfasalazine  Rash and Other (See Comments)    Fever, chills, headache, muscle pain also    Patient Measurements: Height: 5' 2 (157.5 cm) Weight: 73.5 kg (162 lb 0.6 oz) IBW/kg (Calculated) : 50.1 HEPARIN  DW (KG): 66  Vital Signs: Temp: 97.6 F (36.4 C) (06/30 0400) Temp Source: Axillary (06/30 0400) BP: 139/75 (06/30 0800) Pulse Rate: 80 (06/30 0800)  Labs: Recent Labs    07/26/23 1134 07/26/23 1140 07/26/23 1524 07/27/23 2144 07/28/23 0534 07/28/23 0612 07/28/23 1800 07/29/23 0534 07/29/23 0813  HGB 9.8* 11.9* 9.2*  --   --  9.0*  --   --   --   HCT 29.1* 35.0* 27.0*  --   --  28.2*  --   --   --   PLT 164  --   --   --   --  192  --   --   --   APTT 30  --   --  49*  --  65* 78*  --  82*  LABPROT 15.3*  --   --   --   --   --   --   --   --   INR 1.2  --   --   --   --   --   --   --   --   HEPARINUNFRC  --   --   --  0.63  --  0.76*  --   --  0.69  CREATININE 0.50 0.60  --   --  0.45  --   --  0.41*  --     Estimated Creatinine Clearance: 57.1 mL/min (A) (by C-G formula based on SCr of 0.41 mg/dL (L)).   Assessment: 47 YOF with history of DVT on Eliquis  PTA, last dose on 6/26 at 2100.  Patient presented as a code stroke and thrombectomy was unsuccessful.  Pharmacy consulted to dose IV heparin  per stroke protocol.    6/29 CT showed no extension or malignant hemorrhagic since prior CT; mass effect mildly progressed.  aPTT therapeutic and trending up; heparin  level therapeutic and not  yet correlating with aPTT.  CBC stable; no overt bleeding reported.  Goal of Therapy:  Heparin  level 0.3-0.5 units/ml aPTT 66-85 seconds Monitor platelets by anticoagulation protocol: Yes   Plan:  No bolus Reduce IV heparin  slightly to 900 units/hr Daily heparin  level, aPTT and CBC  Edgel Degnan D. Lendell, PharmD, BCPS, BCCCP 07/29/2023, 8:54 AM

## 2023-07-29 NOTE — Progress Notes (Signed)
 NAME:  Rachel Carlson, MRN:  [id number], DOB:  10-06-1945, LOS: 3 ADMISSION DATE:  07/26/2023, CONSULTATION DATE: 07/26/2023 REFERRING MD: Dr. 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 LKW 10:00. Was found sometime after than on the toilet, had falled couldn't get up and had L sided weakness, R gaze, L facial droop.  Imaging in ED w M1 occlusion. In ED was lethargic but able to state that she took eliquis  this morning.   She went for thrombectomy w NIR, unsuccessful revasc.  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)    Esophageal stricture    Family history of breast cancer    Family history of colon cancer    Family history of melanoma    Family history of prostate cancer    Fibroids 1998   uterine, history of (left ovaries)   GERD (gastroesophageal reflux disease)    History of hiatal hernia    History of uterine prolapse    Hx of adenomatous polyp of colon 10/19/2014   Hyperlipidemia    Hypertension    borderline no meds diet and exercise   Nausea and vomiting 05/19/2023   Neuromuscular disorder (HCC)    neuropathy from chemo   Obesity    Ovarian cancer (HCC)    Peritoneal carcinomatosis (HCC)    PVC (premature ventricular contraction)    first dx by holter in 1980's, echo (6/10) EF 55-60%, normal diastolic fxn, normal size RV and fxn, mild MR, PASP    Squamous cell carcinoma of arm    ovarian   Tubal pregnancy    Ulcerative proctosigmoiditis (HCC)    Urge urinary incontinence 06/03/2007   Qualifier: Diagnosis of  By: Cecilie CMA, NANNIE Mitzie Iline Lionel Events: Including procedures, antibiotic start and stop dates in addition to other pertinent events   M1 occlusion, unsuccessful thrombectomy 6/28-on ventilator, weaning well 6/28 MRI-large acute right MCA territory infarct with 4 mm leftward shift, occlusion of right M1 MCA  Interim History / Subjective:  No acute events overnight with the exception of some vent dyssynchrony. Was placed on PSV with improvement, but is back on full support this morning.   Objective    Blood pressure (!) 125/59, pulse 76, temperature 97.6 F (36.4 C), temperature source Axillary, resp. rate 15, height 5' 2 (1.575 m), weight 73.5 kg, last menstrual period 05/29/1996, SpO2 99%.    Vent Mode: PRVC FiO2 (%):  [40 %] 40 % Set Rate:  [18 bmp] 18 bmp Vt Set:  [400 mL] 400 mL PEEP:  [5 cmH20] 5 cmH20 Pressure Support:  [5 cmH20-10 cmH20] 8 cmH20 Plateau Pressure:  [14 cmH20] 14 cmH20   Intake/Output Summary (Last 24 hours) at 07/29/2023 0730 Last data filed at 07/29/2023 0700 Gross per 24 hour  Intake 2815.56 ml  Output 3225 ml  Net -409.44 ml  Filed Weights   07/26/23 1100 07/26/23 1243 07/27/23 0532  Weight: 74.2 kg 74 kg 73.5 kg   Examination: General: Elderly female on vent HENT: ETT, [place]/AT Lungs: Clear bilateral breath sounds Cardiovascular: RRR, no MRG. Infrequent PVC. Abdomen: Soft, NT, ND. Prolapsed colostomy stoma.  Extremities: No clubbing, no edema Neuro: Sedated RASS -3   Resolved problem list   Assessment and Plan   M1 occlusion with unsuccessful thrombectomy - Repeat CT showing cytotoxic edema with brain compresion - MRI with persistent occlusion with midline shift - Hypertonic saline for Na goal 150-155. Sodium checks q 6 hours.  - per stroke  service.   Hypoxemic respiratory failure -Full vent support - VAP bundle - Daily WUA/SBT - At risk of needing prolonged ventilator support due to the nature of her stroke.   Uterine cancer with peritoneal carcinomatosis - Supportive care - Holding home niraparib   Malignant pleural effusion - can consider tap vs pleurx depending on course.   DVT on Eliquis  - heparin  infusion  Ongoing discussion with family    Best Practice (right click and Reselect all SmartList Selections daily)   Diet/type: tubefeeds DVT prophylaxis systemic heparin  Pressure ulcer(s): N/A GI prophylaxis: N/A Lines: N/A Foley:  N/A Code Status:  full code Last date of multidisciplinary goals of care discussion [spoke with son and spouse at bedside]  Labs   CBC: Recent Labs  Lab 07/23/23 1230 07/26/23 1134 07/26/23 1140 07/26/23 1524 07/28/23 0612  WBC 4.6 5.0  --   --  3.8*  NEUTROABS 2.9 4.6  --   --   --   HGB 12.7 9.8* 11.9* 9.2* 9.0*  HCT 37.6 29.1* 35.0* 27.0* 28.2*  MCV 93.3 95.4  --   --  100.4*  PLT 212.0 164  --   --  192    Basic Metabolic Panel: Recent Labs  Lab 07/23/23 1230 07/26/23 1134 07/26/23 1140 07/26/23 1524 07/27/23 1302 07/28/23 0534 07/28/23 0612 07/28/23 1005 07/28/23 1230 07/28/23 1635 07/28/23 2335 07/29/23 0534  NA 135 136 137 138   < > 152*   < > 152* 155* 151* 148* 149*  K 4.2 3.6 3.7 3.5  --  3.0*  --   --   --   --   --  2.8*  CL 100 105 101  --   --  125*  --   --   --   --   --  118*  CO2 28 20*  --   --   --  21*  --   --   --   --   --  21*  GLUCOSE 108* 161* 136*  --   --  159*  --   --   --   --   --  133*  BUN 15 9 15   --   --  7*  --   --   --   --   --  9  CREATININE 0.76 0.50 0.60  --   --  0.45  --   --   --   --   --  0.41*  CALCIUM  9.8 8.5*  --   --   --  8.8*  --   --   --   --   --  8.9   < > = values in this interval not displayed.   GFR: Estimated Creatinine Clearance: 57.1 mL/min (A) (by C-G formula based on SCr of 0.41  mg/dL (L)). Recent Labs  Lab 07/23/23 1230 07/26/23 1134  07/28/23 0612  WBC 4.6 5.0 3.8*    Liver Function Tests: Recent Labs  Lab 07/26/23 1134  AST 16  ALT 11  ALKPHOS 58  BILITOT 0.6  PROT 4.4*  ALBUMIN  3.1*   No results for input(s): LIPASE, AMYLASE in the last 168 hours. No results for input(s): AMMONIA in the last 168 hours.  ABG    Component Value Date/Time   PHART 7.359 07/26/2023 1524   PCO2ART 38.0 07/26/2023 1524   PO2ART 163 (H) 07/26/2023 1524   HCO3 21.4 07/26/2023 1524   TCO2 23 07/26/2023 1524   ACIDBASEDEF 4.0 (H) 07/26/2023 1524   O2SAT 99 07/26/2023 1524     Coagulation Profile: Recent Labs  Lab 07/26/23 1134  INR 1.2    Cardiac Enzymes: No results for input(s): CKTOTAL, CKMB, CKMBINDEX, TROPONINI in the last 168 hours.  HbA1C: Hgb A1c MFr Bld  Date/Time Value Ref Range Status  07/23/2023 12:30 PM 6.3 4.6 - 6.5 % Final    Comment:    Glycemic Control Guidelines for People with Diabetes:Non Diabetic:  <6%Goal of Therapy: <7%Additional Action Suggested:  >8%   08/13/2022 08:43 AM 6.3 4.6 - 6.5 % Final    Comment:    Glycemic Control Guidelines for People with Diabetes:Non Diabetic:  <6%Goal of Therapy: <7%Additional Action Suggested:  >8%     CBG: Recent Labs  Lab 07/28/23 0804 07/28/23 1215 07/28/23 1610 07/28/23 2350 07/29/23 0347  GLUCAP 118* 134* 167* 119* 157*    Review of Systems:   Unable to provide history  Past Medical History:  She,  has a past medical history of Blood transfusion without reported diagnosis, Chronic right ear pain, Diverticulosis, DVT of leg (deep venous thrombosis) (HCC), Esophageal stricture, Family history of breast cancer, Family history of colon cancer, Family history of melanoma, Family history of prostate cancer, Fibroids (1998), GERD (gastroesophageal reflux disease), History of hiatal hernia, History of uterine prolapse, adenomatous polyp of colon (10/19/2014), Hyperlipidemia,  Hypertension, Nausea and vomiting (05/19/2023), Neuromuscular disorder (HCC), Obesity, Ovarian cancer (HCC), Peritoneal carcinomatosis (HCC), PVC (premature ventricular contraction), Squamous cell carcinoma of arm, Tubal pregnancy, Ulcerative proctosigmoiditis (HCC), and Urge urinary incontinence (06/03/2007).   Surgical History:   Past Surgical History:  Procedure Laterality Date   BILATERAL SALPINGECTOMY Bilateral 12/12/2022   Procedure: OPEN BILATERAL SALPINGECTOMY-OOPHORECTOMY;  Surgeon: Viktoria Comer SAUNDERS, MD;  Location: [facility];  Service: Gynecology;  Laterality: Bilateral;   BLADDER NECK SUSPENSION  1998   BREAST BIOPSY Left    BREAST LUMPECTOMY Left    benign   CATARACT EXTRACTION Right 01/2020   CATARACT EXTRACTION W/PHACO Left 08/12/2020   Procedure: CATARACT EXTRACTION PHACO AND INTRAOCULAR LENS PLACEMENT LEFT EYE;  Surgeon: Harrie Agent, MD;  Location: [facility];  Service: Ophthalmology;  Laterality: Left;  left CDE=10.71   COLONOSCOPY  05/18/2011/10/12/14   Dr. Lupita Commander   DEBULKING N/A 12/12/2022   Procedure: RADICAL TUMOR DEBULKING, PERITONEAL STRIPPING, MOBILIZATION SPLENIC FLEXURE, RECTOSIGMOID RESECTION,SMALL BOWEL RESECTION AND REANASTOMOSIS ,END TO END DESCENDING COLOSTOMY;  Surgeon: Viktoria Comer SAUNDERS, MD;  Location: [facility];  Service: Gynecology;  Laterality: N/A;   ECTOPIC PREGNANCY SURGERY  1980   ESOPHAGOGASTRODUODENOSCOPY (EGD) WITH ESOPHAGEAL DILATION  04/03/2012   Dr. Lupita Commander   INCISION AND DRAINAGE ABSCESS N/A 01/02/2023   Procedure: INCISION AND DRAINAGE OF ABDOMINAL WOUND SEROMA;  Surgeon: Rogelio Planas, MD;  Location: [facility];  Service: Gynecology;  Laterality: N/A;   IR CT HEAD LTD  07/26/2023   IR IMAGING GUIDED PORT INSERTION  09/06/2022  IR PERCUTANEOUS ART THROMBECTOMY/INFUSION INTRACRANIAL INC DIAG ANGIO  07/26/2023   IR US  GUIDE VASC ACCESS RIGHT  07/26/2023   OMENTECTOMY N/A 12/12/2022   Procedure: OMENTECTOMY;  Surgeon: Viktoria Comer SAUNDERS,  MD;  Location: [facility];  Service: Gynecology;  Laterality: N/A;   PARTIAL HYSTERECTOMY     abdominal, fibroids, and prolapse (1998) bladder tack   SALPINGOOPHORECTOMY Bilateral 12/12/2022   Procedure: DIAGNOSTIC LAPAROSCOPY;  Surgeon: Viktoria Comer SAUNDERS, MD;  Location: [facility];  Service: Gynecology;  Laterality: Bilateral;   SKIN CANCER EXCISION     UPPER GASTROINTESTINAL ENDOSCOPY       Social History:   reports that she has never smoked. She has never used smokeless tobacco. She reports that she does not drink alcohol  and does not use drugs.   Family History:  Her family history includes Arrhythmia in her brother; Barrett's esophagus in her maternal aunt and mother; Breast cancer in her sister; Colon cancer in her cousin and another family member; Colon cancer (age of onset: 70) in her son; Colon cancer (age of onset: 65) in her brother; Colon polyps in her sister; Congestive Heart Failure in her maternal grandmother; Coronary artery disease in her mother; Diabetes in her mother; Diabetes type II in her mother; Ehlers-Danlos syndrome in her daughter; Heart attack (age of onset: 12) in her mother; Melanoma in her maternal uncle; Other in her son; Prostate cancer in her maternal grandfather; Skin cancer in her maternal aunt and mother; Sudden death in her mother; Sudden death (age of onset: 11) in her father; Transient ischemic attack in her mother; Tuberculosis in her paternal grandfather. There is no history of Esophageal cancer, Stomach cancer, Rectal cancer, Uterine cancer, or Bladder Cancer.   Allergies Allergies  Allergen Reactions   Aspartame Other (See Comments)    Intolerance to artifical sweeteners, pt states caused burning of bladder   Levaquin  [Levofloxacin ] Nausea Only   Pantoprazole  Sodium Diarrhea    Abdominal pain   Ranitidine Diarrhea and Other (See Comments)    Abdominal pain, also   Omeprazole  Diarrhea and Other (See Comments)    Abdominal pain, also   Sulfasalazine  Rash  and Other (See Comments)    Fever, chills, headache, muscle pain also    Critical care time 39 minutes  Deward Eastern, AGACNP-BC [facility]  See Amion for personal pager PCCM on call pager [phone] until 7pm. Please call Elink 7p-7a. [phone]  07/29/2023 7:42 AM

## 2023-07-29 NOTE — TOC CAGE-AID Note (Signed)
 Transition of Care [facility]) - CAGE-AID Screening   Patient Details  Name: Rachel Carlson MRN: [id number] Date of Birth: 02-09-1945  [facility]) CM/SW Contact:    Antavious Spanos E Donae Kueker, LCSW Phone Number: 07/29/2023, 11:06 AM   Clinical Narrative: Currently intubated.   CAGE-AID Screening: Substance Abuse Screening unable to be completed due to: : Patient unable to participate

## 2023-07-30 ENCOUNTER — Other Ambulatory Visit (HOSPITAL_COMMUNITY): Payer: Self-pay

## 2023-07-30 ENCOUNTER — Inpatient Hospital Stay (HOSPITAL_COMMUNITY)

## 2023-07-30 DIAGNOSIS — I63411 Cerebral infarction due to embolism of right middle cerebral artery: Secondary | ICD-10-CM | POA: Diagnosis not present

## 2023-07-30 DIAGNOSIS — Z515 Encounter for palliative care: Secondary | ICD-10-CM | POA: Diagnosis not present

## 2023-07-30 DIAGNOSIS — G935 Compression of brain: Secondary | ICD-10-CM | POA: Diagnosis not present

## 2023-07-30 DIAGNOSIS — Z7189 Other specified counseling: Secondary | ICD-10-CM | POA: Diagnosis not present

## 2023-07-30 DIAGNOSIS — Z978 Presence of other specified devices: Secondary | ICD-10-CM | POA: Diagnosis not present

## 2023-07-30 DIAGNOSIS — I63431 Cerebral infarction due to embolism of right posterior cerebral artery: Secondary | ICD-10-CM | POA: Diagnosis not present

## 2023-07-30 DIAGNOSIS — C786 Secondary malignant neoplasm of retroperitoneum and peritoneum: Secondary | ICD-10-CM | POA: Diagnosis not present

## 2023-07-30 DIAGNOSIS — J90 Pleural effusion, not elsewhere classified: Secondary | ICD-10-CM

## 2023-07-30 DIAGNOSIS — D6859 Other primary thrombophilia: Secondary | ICD-10-CM | POA: Diagnosis not present

## 2023-07-30 DIAGNOSIS — J910 Malignant pleural effusion: Secondary | ICD-10-CM | POA: Diagnosis not present

## 2023-07-30 LAB — SODIUM
Sodium: 156 mmol/L — ABNORMAL HIGH (ref 135–145)
Sodium: 158 mmol/L — ABNORMAL HIGH (ref 135–145)

## 2023-07-30 LAB — BODY FLUID CELL COUNT WITH DIFFERENTIAL
Eos, Fluid: 0 %
Lymphs, Fluid: 62 %
Monocyte-Macrophage-Serous Fluid: 8 % — ABNORMAL LOW (ref 50–90)
Neutrophil Count, Fluid: 30 % — ABNORMAL HIGH (ref 0–25)
Total Nucleated Cell Count, Fluid: 330 uL (ref 0–1000)

## 2023-07-30 LAB — URINALYSIS, ROUTINE W REFLEX MICROSCOPIC
Bilirubin Urine: NEGATIVE
Glucose, UA: NEGATIVE mg/dL
Ketones, ur: NEGATIVE mg/dL
Leukocytes,Ua: NEGATIVE
Nitrite: NEGATIVE
Protein, ur: NEGATIVE mg/dL
Specific Gravity, Urine: 1.015 (ref 1.005–1.030)
pH: 5 (ref 5.0–8.0)

## 2023-07-30 LAB — CBC
HCT: 29.6 % — ABNORMAL LOW (ref 36.0–46.0)
Hemoglobin: 9.3 g/dL — ABNORMAL LOW (ref 12.0–15.0)
MCH: 32.4 pg (ref 26.0–34.0)
MCHC: 31.4 g/dL (ref 30.0–36.0)
MCV: 103.1 fL — ABNORMAL HIGH (ref 80.0–100.0)
Platelets: 122 10*3/uL — ABNORMAL LOW (ref 150–400)
RBC: 2.87 MIL/uL — ABNORMAL LOW (ref 3.87–5.11)
RDW: 16.8 % — ABNORMAL HIGH (ref 11.5–15.5)
WBC: 3.2 10*3/uL — ABNORMAL LOW (ref 4.0–10.5)
nRBC: 0 % (ref 0.0–0.2)

## 2023-07-30 LAB — GLUCOSE, CAPILLARY
Glucose-Capillary: 110 mg/dL — ABNORMAL HIGH (ref 70–99)
Glucose-Capillary: 118 mg/dL — ABNORMAL HIGH (ref 70–99)
Glucose-Capillary: 127 mg/dL — ABNORMAL HIGH (ref 70–99)
Glucose-Capillary: 130 mg/dL — ABNORMAL HIGH (ref 70–99)
Glucose-Capillary: 145 mg/dL — ABNORMAL HIGH (ref 70–99)
Glucose-Capillary: 158 mg/dL — ABNORMAL HIGH (ref 70–99)

## 2023-07-30 LAB — PROTEIN, PLEURAL OR PERITONEAL FLUID: Total protein, fluid: 3.7 g/dL

## 2023-07-30 LAB — BASIC METABOLIC PANEL WITH GFR
Anion gap: 6 (ref 5–15)
BUN: 11 mg/dL (ref 8–23)
CO2: 25 mmol/L (ref 22–32)
Calcium: 9.1 mg/dL (ref 8.9–10.3)
Chloride: 125 mmol/L — ABNORMAL HIGH (ref 98–111)
Creatinine, Ser: 0.47 mg/dL (ref 0.44–1.00)
GFR, Estimated: >60 mL/min (ref >60)
Glucose, Bld: 140 mg/dL — ABNORMAL HIGH (ref 70–99)
Potassium: 3.3 mmol/L — ABNORMAL LOW (ref 3.5–5.1)
Sodium: 156 mmol/L — ABNORMAL HIGH (ref 135–145)

## 2023-07-30 LAB — MAGNESIUM: Magnesium: 2.1 mg/dL (ref 1.7–2.4)

## 2023-07-30 LAB — GLUCOSE, PLEURAL OR PERITONEAL FLUID: Glucose, Fluid: 137 mg/dL

## 2023-07-30 LAB — LACTATE DEHYDROGENASE: LDH: 237 U/L — ABNORMAL HIGH (ref 98–192)

## 2023-07-30 LAB — TRIGLYCERIDES: Triglycerides: 161 mg/dL — ABNORMAL HIGH (ref <150)

## 2023-07-30 LAB — APTT
aPTT: 87 s — ABNORMAL HIGH (ref 24–36)
aPTT: 48 s — ABNORMAL HIGH (ref 24–36)

## 2023-07-30 LAB — LACTATE DEHYDROGENASE, PLEURAL OR PERITONEAL FLUID: LD, Fluid: 1199 U/L — ABNORMAL HIGH (ref 3–23)

## 2023-07-30 LAB — BRAIN NATRIURETIC PEPTIDE: B Natriuretic Peptide: 140.4 pg/mL — ABNORMAL HIGH (ref 0.0–100.0)

## 2023-07-30 LAB — HEPARIN LEVEL (UNFRACTIONATED): Heparin Unfractionated: 0.68 [IU]/mL (ref 0.30–0.70)

## 2023-07-30 LAB — PROTEIN, TOTAL: Total Protein: 5.4 g/dL — ABNORMAL LOW (ref 6.5–8.1)

## 2023-07-30 LAB — PHOSPHORUS: Phosphorus: 3.6 mg/dL (ref 2.5–4.6)

## 2023-07-30 LAB — MRSA NEXT GEN BY PCR, NASAL: MRSA by PCR Next Gen: NOT DETECTED

## 2023-07-30 MED ORDER — VANCOMYCIN HCL 1500 MG/300ML IV SOLN
1500.0000 mg | Freq: Once | INTRAVENOUS | Status: AC
  Administered 2023-07-30: 1500 mg via INTRAVENOUS
  Filled 2023-07-30: qty 300

## 2023-07-30 MED ORDER — VANCOMYCIN HCL 750 MG/150ML IV SOLN: 750.0000 mg | INTRAVENOUS | Status: DC

## 2023-07-30 MED ORDER — POTASSIUM CHLORIDE 20 MEQ PO PACK
40.0000 meq | PACK | Freq: Once | ORAL | Status: AC
  Administered 2023-07-30: 40 meq
  Filled 2023-07-30: qty 2

## 2023-07-30 MED ORDER — FUROSEMIDE 10 MG/ML IJ SOLN
40.0000 mg | Freq: Once | INTRAMUSCULAR | Status: AC
  Administered 2023-07-30: 40 mg via INTRAVENOUS
  Filled 2023-07-30: qty 4

## 2023-07-30 MED ORDER — HEPARIN (PORCINE) 25000 UT/250ML-% IV SOLN
1200.0000 [IU]/h | INTRAVENOUS | Status: DC
  Administered 2023-07-31 – 2023-08-01 (×2): 900 [IU]/h via INTRAVENOUS
  Administered 2023-08-02: 1050 [IU]/h via INTRAVENOUS
  Administered 2023-08-03 – 2023-08-07 (×6): 1200 [IU]/h via INTRAVENOUS
  Filled 2023-07-30 (×8): qty 250

## 2023-07-30 MED ORDER — POTASSIUM CHLORIDE 20 MEQ PO PACK: 40.0000 meq | PACK | Freq: Once | ORAL | Status: DC

## 2023-07-30 MED ORDER — SODIUM CHLORIDE 0.9 % IV SOLN
2.0000 g | Freq: Two times a day (BID) | INTRAVENOUS | Status: AC
  Administered 2023-07-30 – 2023-08-03 (×10): 2 g via INTRAVENOUS
  Filled 2023-07-30 (×10): qty 12.5

## 2023-07-30 MED ORDER — POTASSIUM CHLORIDE 20 MEQ PO PACK
40.0000 meq | PACK | Freq: Four times a day (QID) | ORAL | Status: AC
  Administered 2023-07-30 (×2): 40 meq
  Filled 2023-07-30 (×2): qty 2

## 2023-07-30 NOTE — Progress Notes (Signed)
 Pt transported from 4N32 to CT and back by Rn and RT w/ /o complications.

## 2023-07-30 NOTE — Progress Notes (Signed)
 SLP Cancellation Note  Patient Details Name: JANITA CAMBEROS MRN: [id number] DOB: 1945-04-17   Cancelled treatment:       Reason Eval/Treat Not Completed: Patient not medically ready. Unable to complete SLE at this time. Pt continues to be intubated. Will continue efforts.   Roniya Tetro B. Dory, MSP, CCC-SLP Speech Language Pathologist Office: [phone]  Dory Caprice Daring 07/30/2023, 9:37 AM

## 2023-07-30 NOTE — Plan of Care (Signed)
 Patient switched to pressure support overnight and tolerated settings well with sedation available. No acute night shift events

## 2023-07-30 NOTE — Progress Notes (Addendum)
 STROKE TEAM PROGRESS NOTE   SIGNIFICANT HOSPITAL EVENTS 6/27- Admitted to ICU, intubated   INTERIM HISTORY/SUBJECTIVE Patient remains hemodynamically stable with Tmax of 100.5.  Urinalysis was negative for UTI, and respiratory culture growing mixed flora.  Chest x-ray reveals vascular congestion as well as pleural effusions, thoracentesis to be performed by CCM.  Will send pleural fluid for cytology.  Repeat head CT was stable. Palliative care team to meet with family later today.  Neurological exam remains unchanged.  Patient hard to arouse not following commands but very purposeful movements on the right OBJECTIVE  CBC    Component Value Date/Time   WBC 3.2 (L) 07/30/2023 0534   RBC 2.87 (L) 07/30/2023 0534   HGB 9.3 (L) 07/30/2023 0534   HGB 11.0 (L) 04/26/2023 1043   HCT 29.6 (L) 07/30/2023 0534   PLT 122 (L) 07/30/2023 0534   PLT 199 04/26/2023 1043   MCV 103.1 (H) 07/30/2023 0534   MCH 32.4 07/30/2023 0534   MCHC 31.4 07/30/2023 0534   RDW 16.8 (H) 07/30/2023 0534   LYMPHSABS 0.2 (L) 07/26/2023 1134   MONOABS 0.1 07/26/2023 1134   EOSABS 0.0 07/26/2023 1134   BASOSABS 0.0 07/26/2023 1134    BMET    Component Value Date/Time   NA 158 (H) 07/30/2023 1125   K 3.3 (L) 07/30/2023 0534   CL 125 (H) 07/30/2023 0534   CO2 25 07/30/2023 0534   GLUCOSE 140 (H) 07/30/2023 0534   BUN 11 07/30/2023 0534   CREATININE 0.47 07/30/2023 0534   CREATININE 0.64 04/26/2023 1043   CALCIUM  9.1 07/30/2023 0534   GFRNONAA >60 07/30/2023 0534   GFRNONAA >60 04/26/2023 1043    IMAGING past 24 hours CT HEAD WO CONTRAST ( ) Result Date: 07/30/2023 CLINICAL DATA:  78 year old female status post code stroke presentation on 07/26/2023 with right MCA infarct. Subsequent encounter. EXAM: CT HEAD WITHOUT CONTRAST TECHNIQUE: Contiguous axial images were obtained from the base of the skull through the vertex without intravenous contrast. RADIATION DOSE REDUCTION: This exam was performed according  to the departmental dose-optimization program which includes automated exposure control, adjustment of the mA and/or kV according to patient size and/or use of iterative reconstruction technique. COMPARISON:  Head CT 07/28/2023 and earlier. FINDINGS: Brain: Confluent cytotoxic edema redemonstrated in both the right MCA and PCA territories. Stable parenchymal involvement. No malignant hemorrhagic transformation. Intracranial mass effect with mostly effaced right lateral ventricle. Leftward midline shift is not significantly changed, 2-3 mm. No ventriculomegaly. Basilar cisterns remain patent. Stable gray-white differentiation elsewhere. Vascular: Stable right MCA hyperdensity. Skull: Stable and intact. Sinuses/Orbits: Visualized paranasal sinuses and mastoids are stable and well aerated. Other: Intubated. Oral enteric tube also RC visible. Stable orbit and scalp soft tissues. IMPRESSION: 1. Stable extent of confluent right MCA and PCA territory cytotoxic edema. No malignant hemorrhagic transformation. 2. Intracranial mass effect with leftward midline shift not significantly changed, 2-3 mm. 3. No new intracranial abnormality. Electronically Signed   By: [name]   On: 07/30/2023 09:03   DG CHEST PORT 1 VIEW Result Date: 07/29/2023 CLINICAL DATA:  Fevers EXAM: PORTABLE CHEST 1 VIEW COMPARISON:  07/26/2023 FINDINGS: Endotracheal tube and gastric catheter are noted in satisfactory position. Right chest wall port is again seen and stable. Large left-sided pleural effusion is noted and stable. Small right-sided effusion is noted as well. Mild central vascular congestion is noted. IMPRESSION: Bilateral pleural effusions left greater than right. Tubes and lines as described. Mild central vascular congestion. Electronically Signed   By:  Oneil Devonshire M.D.   On: 07/29/2023 19:22     Vitals:   07/30/23 0800 07/30/23 0900 07/30/23 1000 07/30/23 1100  BP: (!) 153/91 137/70 (!) 155/73 (!) 149/75  Pulse: (!) 105 83 87  92  Resp: (!) 33 20 17 15   Temp: 99.4 F (37.4 C)     TempSrc: Axillary     SpO2: 97% 99% 97% 96%  Weight:      Height:         PHYSICAL EXAM General: Intubated well-nourished, well-developed elderly Caucasian lady in no acute distress CV: Regular rate and rhythm on monitor Respiratory: Respirations synchronous with ventilator   NEURO:  Mental Status: Patient is sedated with low-dose propofol  and intubated.  Pupils are equal round and sluggishly reactive to light, cough reflex is intact, patient will follow commands with right lower extremity and localizes with right upper extremity.  She does not move left upper extremity today and withdraws bilateral lower extremities to noxious  ASSESSMENT/PLAN  Ms. MARIELLE MANTIONE is a 78 y.o. 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 pfo Code Stroke CT head- Acute right MCA infarct.  ASPECTS of 7. No intracranial hemorrhage. CTA head & neck - Occlusion of the proximal/mid M1 segment right MCA with intraluminal filling defect concerning for thrombus. Few diminutive caliber M2 and M3 branches noted likely filled via collaterals. CT perfusion Core 62 Penumbra 132  Repeat head CT - Confluent Right hemisphere cytotoxic edema has developed in both the right MCA and Right PCA vascular territories. No malignant hemorrhagic transformation. Mild mass effect on the right lateral ventricle with no midline shift. 6/29 - Repeat CT Head - Confluent Right MCA And PCA territory infarcts. No  extension or malignant hemorrhagic transformation since yesterday. But subsequent intracranial mass effect has mildly progressed including 2 mm of leftward midline shift now. MRI Large acute right MCA territory infarct with 4 mm of leftward midline shift. No acute hemorrhage. MRA Head- Persistent occlusion of the right M1 MCA.  2D Echo ejection fraction 60 to 65%.  Left atrial size normal.  No right-to-left shunt LDL 61 HgbA1c 6.3 VTE prophylaxis - heparin  IV Eliquis  (apixaban ) daily prior to admission, now on heparin  IV  Therapy recommendations:  Pending Disposition:  Pending   Cerebral cytotoxic edema with brain compression HTS at 110ml/hr -> 124ml/hr 6/28 250cc bolus 6/29 250cc bolus  Na 138 - 151 Na q 6 per protocol   Procedural intubation  Hypoxemic respiratory failure Hx of malignant pleural effusion PCCM consulted SBT/extubation per CCM CXR: Large left and small right pleural effusions.   Hypertension Stable Blood Pressure Goal: BP less than 180/105   Hyperlipidemia Home meds:  Crestor  20mg , resumed in hospital LDL 61, goal < 70 Continue statin at discharge  Dysphagia Patient has post-stroke dysphagia, SLP consulted    Diet   Diet NPO time specified   Advance diet as tolerated  Peritoneal carcinomatosis undergoing chemo Follows with Dr. Katragadda at the [facility] Current protocol: niraparib  200 mg daily.  Gyn Onc-  Dr. Viktoria  s/p omenectomy small bowel resection, s/p colostomy with colostomy prolapse   Hx left gastrocnemius/calf vein distal DVT Eliquis  5mg  BID No evidence of DVT  Fever  Tmax 101.3 WBC 3.6 RN reports thick secretions Urinalysis negative Sputum culture growing mixed flora, cefepime started by CCM Chest x-ray shows bilateral pleural effusions and venous congestion  Hospital day # 4  Patient seen and examined by NP/APP with MD. MD to update note as needed.   Cortney E Everitt Clint Kill , MSN, AGACNP-BC [facility] See Amion for schedule and pager information 07/30/2023 12:24 PM I have personally obtained history,examined this patient, reviewed notes, independently viewed imaging studies, participated in medical decision making and plan of care.ROS completed by me personally and pertinent positives fully documented  I have made any additions or clarifications directly to the above note. Agree with note above.  Patient neurological exam remains unchanged with large right MCA infarct with cytotoxic edema which appears stable.  Plan start tapering hypertonic saline.  Critical care team to do thoracocentesis today.  Palliative care team to meet with family about goals of care.  No family available at the bedside at the time of rounds for discussion today.  Discussed with critical care team. This patient is critically ill and at significant risk of neurological worsening, death and care requires constant monitoring of vital signs, hemodynamics,respiratory and cardiac monitoring, extensive review of multiple databases, frequent neurological assessment, discussion with family, other specialists and medical decision making of high complexity.I have made any additions or clarifications directly to the above note.This critical care time does not reflect procedure time, or teaching time or supervisory time of PA/NP/Med Resident etc but could involve care discussion time.  I spent 30 minutes of neurocritical care time  in the care of  this patient.     Eather Popp, MD Medical Director [facility] Pager: [phone] 07/30/2023 3:47 PM  To contact Stroke Continuity provider, please refer to [email or web address]. After hours, contact General Neurology

## 2023-07-30 NOTE — Progress Notes (Signed)
 Pharmacy Antibiotic Note  Rachel Carlson is a 78 y.o. female admitted on 07/26/2023 with CVA s/p unsuccessful thrombectomy.  Now with fever and increased secretions.  Pharmacy has been consulted for vancomycin  and cefepime dosing for PNA.  CXR shows L pleural effusion that is bigger in size.  Patient is immunocompromised.   Renal function stable, Tmax 101.3, WBC 3.2.  Plan: Vanc 1500mg  IV x 1, then 750mg  IV Q24H for AUC 443 using SCr 0.8 Cefepime 2gm IV Q12H Monitor renal fxn, clinical progress, vanc levels if indicated Check MRSA PCR  Height: 5' 2 (157.5 cm) Weight: 76.5 kg (168 lb 10.4 oz) IBW/kg (Calculated) : 50.1  Temp (24hrs), Avg:99.8 F (37.7 C), Min:99 F (37.2 C), Max:100.5 F (38.1 C)  Recent Labs  Lab 07/23/23 1230 07/26/23 1134 07/26/23 1140 07/28/23 0534 07/28/23 0612 07/29/23 0534 07/29/23 0912 07/30/23 0534  WBC 4.6 5.0  --   --  3.8*  --  3.6* 3.2*  CREATININE 0.76 0.50 0.60 0.45  --  0.41*  --  0.47    Estimated Creatinine Clearance: 58.2 mL/min (by C-G formula based on SCr of 0.47 mg/dL).    Allergies  Allergen Reactions   Aspartame Other (See Comments)    Intolerance to artifical sweeteners, pt states caused burning of bladder   Levaquin  [Levofloxacin ] Nausea Only   Pantoprazole  Sodium Diarrhea    Abdominal pain   Ranitidine Diarrhea and Other (See Comments)    Abdominal pain, also   Omeprazole  Diarrhea and Other (See Comments)    Abdominal pain, also   Sulfasalazine  Rash and Other (See Comments)    Fever, chills, headache, muscle pain also    Vanc 7/1 >> Cefepime 7/1 >>  6/30 TA -   Rachel Carlson D. Lendell, PharmD, BCPS, BCCCP 07/30/2023, 8:04 AM

## 2023-07-30 NOTE — Progress Notes (Addendum)
 PHARMACY - ANTICOAGULATION CONSULT NOTE  Pharmacy Consult:  Heparin  Indication:  History of DVT, new CVA  Allergies  Allergen Reactions   Aspartame Other (See Comments)    Intolerance to artifical sweeteners, pt states caused burning of bladder   Levaquin  [Levofloxacin ] Nausea Only   Pantoprazole  Sodium Diarrhea    Abdominal pain   Ranitidine Diarrhea and Other (See Comments)    Abdominal pain, also   Omeprazole  Diarrhea and Other (See Comments)    Abdominal pain, also   Sulfasalazine  Rash and Other (See Comments)    Fever, chills, headache, muscle pain also    Patient Measurements: Height: 5' 2 (157.5 cm) Weight: 76.5 kg (168 lb 10.4 oz) IBW/kg (Calculated) : 50.1 HEPARIN  DW (KG): 66  Vital Signs: Temp: 99.5 F (37.5 C) (07/01 0400) Temp Source: Axillary (07/01 0400) BP: 135/75 (07/01 0700) Pulse Rate: 70 (07/01 0700)  Labs: Recent Labs    07/27/23 2144 07/28/23 0534 07/28/23 0612 07/28/23 1800 07/29/23 0534 07/29/23 0813 07/29/23 0912 07/29/23 1127 07/30/23 0510 07/30/23 0534  HGB   < >  --  9.0*  --   --   --  8.5* 7.1*  --  9.3*  HCT   < >  --  28.2*  --   --   --  26.8* 21.0*  --  29.6*  PLT  --   --  192  --   --   --  192  --   --  122*  APTT  --   --  65* 78*  --  82*  --   --   --  87*  HEPARINUNFRC  --   --  0.76*  --   --  0.69  --   --  0.68  --   CREATININE  --  0.45  --   --  0.41*  --   --   --   --  0.47   < > = values in this interval not displayed.    Estimated Creatinine Clearance: 58.2 mL/min (by C-G formula based on SCr of 0.47 mg/dL).   Assessment: 88 YOF with history of DVT on Eliquis  PTA, last dose on 6/26 at 2100.  Patient presented as a code stroke and thrombectomy was unsuccessful.  Pharmacy consulted to dose IV heparin  per stroke protocol.    6/29 CT showed no extension or malignant hemorrhagic since prior CT; mass effect mildly progressed.  aPTT slightly high; heparin  level therapeutic and not yet correlating with aPTT.  CBC  stable; no overt bleeding reported.  Goal of Therapy:  Heparin  level 0.3-0.5 units/ml aPTT 66-85 seconds Monitor platelets by anticoagulation protocol: Yes   Plan:  No bolus Reduce IV heparin  to 800 units/hr Daily heparin  level, aPTT and CBC  Caralyn Twining D. Lendell, PharmD, BCPS, BCCCP 07/30/2023, 7:20 AM   ===================  Addendum: Heparin  turned off for thoracentesis To resume at previous rate 1 hr later per CCM Check 8 hr aPTT  Ailea Rhatigan D. Lendell, PharmD, BCPS, BCCCP 07/30/2023, 12:41 PM

## 2023-07-30 NOTE — Progress Notes (Signed)
 PT Cancellation Note  Patient Details Name: Rachel Carlson MRN: [id number] DOB: Apr 01, 1945   Cancelled Treatment:    Reason Eval/Treat Not Completed: Medical issues which prohibited therapy - pt intubated, sedation just turned off and not yet awake enough to participate in PT this date. PT to check back as appropriate.   Rachel Carlson S, PT DPT Acute Rehabilitation Services Secure Chat Preferred  Office [phone]    Johana FORBES Kingdom 07/30/2023, 10:13 AM

## 2023-07-30 NOTE — Progress Notes (Signed)
 NAME:  Rachel Carlson, MRN:  [id number], DOB:  Aug 06, 1945, LOS: 4 ADMISSION DATE:  07/26/2023, CONSULTATION DATE: 07/26/2023 REFERRING MD: Dr. 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 LKW 10:00. Was found sometime after than on the toilet, had falled couldn't get up and had L sided weakness, R gaze, L facial droop.  Imaging in ED w M1 occlusion. In ED was lethargic but able to state that she took eliquis  this morning.   She went for thrombectomy w NIR, unsuccessful revasc.  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)    Esophageal stricture    Family history of breast cancer    Family history of colon cancer    Family history of melanoma    Family history of prostate cancer    Fibroids 1998   uterine, history of (left ovaries)   GERD (gastroesophageal reflux disease)    History of hiatal hernia    History of uterine prolapse    Hx of adenomatous polyp of colon 10/19/2014   Hyperlipidemia    Hypertension    borderline no meds diet and exercise   Nausea and vomiting 05/19/2023   Neuromuscular disorder (HCC)    neuropathy from chemo   Obesity    Ovarian cancer (HCC)    Peritoneal carcinomatosis (HCC)    PVC (premature ventricular contraction)    first dx by holter in 1980's, echo (6/10) EF 55-60%, normal diastolic fxn, normal size RV and fxn, mild MR, PASP    Squamous cell carcinoma of arm    ovarian   Tubal pregnancy    Ulcerative proctosigmoiditis (HCC)    Urge urinary incontinence 06/03/2007   Qualifier: Diagnosis of  By: Cecilie CMA, NANNIE Mitzie Iline Lionel Events: Including procedures, antibiotic start and stop dates in addition to other pertinent events   M1 occlusion, unsuccessful thrombectomy 6/28-on ventilator, weaning well 6/28 MRI-large acute right MCA territory infarct with 4 mm leftward shift, occlusion of right M1 MCA  Interim History / Subjective:  No acute events overnights Did not wean well yesterday, after weaning pretty well the day before Secretions picked up yesterday afternoon Sputum cultures and UA sent PMT consulted.    Objective    Blood pressure 135/75, pulse 70, temperature 99.5 F (37.5 C), temperature source Axillary, resp. rate 17, height 5' 2 (1.575 m), weight 76.5 kg, last menstrual period 05/29/1996, SpO2 99%.    Vent Mode: PRVC FiO2 (%):  [40 %] 40 % Set Rate:  [18 bmp] 18 bmp Vt Set:  [400 mL] 400 mL PEEP:  [5 cmH20] 5 cmH20 Pressure Support:  [8 cmH20] 8 cmH20 Plateau Pressure:  [14 cmH20] 14 cmH20   Intake/Output Summary (Last 24 hours) at 07/30/2023 0726 Last data filed at 07/30/2023 0703 Gross per 24 hour  Intake 3768.01 ml  Output 1925 ml  Net  1843.01 ml   Filed Weights   07/26/23 1243 07/27/23 0532 07/30/23 0600  Weight: 74 kg 73.5 kg 76.5 kg   Examination:  General: elderly female on vent HENT: [place] AT PERRL, no JVD Lungs: Diminished left base Cardiovascular: RRR, no MRG Abdomen: Soft, NT, ND. Prolapsed colostomy stoma.  Extremities: No acute deformity or significant edema.  Neuro: Sedated RASS -3   Resolved problem list   Assessment and Plan   M1 occlusion with unsuccessful thrombectomy - CT pending this morning - Hypertonic saline for Na goal 150-155. Sodium checks q 6 hours. - HTS per stroke service - Ongoing goals of care. PMT to consult    Hypoxemic respiratory failure - Full vent support - VAP bundle - Daily WUA/SBT - At risk of needing prolonged ventilator support due to the nature of her stroke.   Fevers: Low grade fevers. UA pending. Leukopenic. Increased tracheal secretion production.  - Borderline for antibiotics. Will start Cefepime, vanco. as we are likely nearing an extubation discussion and want to make sure she has the best possible chance to succeed.  - Sputum culture pending - Follow UA - Repeat MRSA nares to help deescalate.  Uterine cancer with peritoneal carcinomatosis - Supportive care - Holding home niraparib   Malignant pleural effusion - Has grown considerably in the last month - Will plan to tap to r/o empyema and increase her odds for successful extubation.   DVT on Eliquis  - heparin  infusion  Ongoing discussion with family   Best Practice (right click and Reselect all SmartList Selections daily)   Diet/type: tubefeeds DVT prophylaxis systemic heparin  Pressure ulcer(s): N/A GI prophylaxis: N/A Lines: N/A Foley:  N/A Code Status:  full code Last date of multidisciplinary goals of care discussion [spoke with son and spouse at bedside]  Labs   CBC: Recent Labs  Lab 07/23/23 1230 07/26/23 1134 07/26/23 1140 07/26/23 1524 07/28/23 0612 07/29/23 0912 07/29/23 1127 07/30/23 0534  WBC 4.6 5.0  --   --  3.8* 3.6*  --  3.2*  NEUTROABS 2.9 4.6  --   --   --   --   --   --   HGB 12.7 9.8*   < > 9.2* 9.0* 8.5* 7.1* 9.3*  HCT 37.6 29.1*   < > 27.0* 28.2* 26.8* 21.0* 29.6*  MCV 93.3 95.4  --   --  100.4* 100.4*  --  103.1*  PLT 212.0 164  --   --  192 192  --  122*   < > = values in this interval not displayed.    Basic Metabolic Panel: Recent Labs  Lab 07/23/23 1230 07/26/23 1134 07/26/23 1140 07/26/23 1524 07/27/23 1302 07/28/23 0534 07/28/23 0612 07/29/23 0534 07/29/23 1127 07/29/23 1134 07/29/23 1724 07/29/23 2325 07/30/23 0534  NA 135 136 137 138   < > 152*   <  > 149* 154* 152* 155* 156* 156*  K 4.2 3.6 3.7 3.5  --  3.0*  --  2.8* 3.5  --   --   --  3.3*  CL 100 105 101  --   --  125*  --  118*  --   --   --   --  125*  CO2 28 20*  --   --   --  21*  --  21*  --   --   --   --  25  GLUCOSE 108* 161* 136*  --   --  159*  --  133*  --   --   --   --  140*  BUN 15 9 15   --   --  7*  --  9  --   --   --   --  11  CREATININE 0.76 0.50 0.60  --   --  0.45  --  0.41*  --   --   --   --  0.47  CALCIUM  9.8 8.5*  --   --   --  8.8*  --  8.9  --   --   --   --  9.1  MG  --   --   --   --   --   --   --   --   --   --   --   --  2.1  PHOS  --   --   --   --   --   --   --   --   --   --   --   --  3.6   < > = values in this interval not displayed.   GFR: Estimated Creatinine Clearance: 58.2 mL/min (by C-G formula based on SCr of 0.47 mg/dL). Recent Labs  Lab 07/26/23 1134 07/28/23 0612 07/29/23 0912 07/30/23 0534  WBC 5.0 3.8* 3.6* 3.2*    Liver Function Tests: Recent Labs  Lab 07/26/23 1134  AST 16  ALT 11  ALKPHOS 58  BILITOT 0.6  PROT 4.4*  ALBUMIN  3.1*   No results for input(s): LIPASE, AMYLASE in the last 168 hours. No results for input(s): AMMONIA in the last 168 hours.  ABG    Component Value Date/Time   PHART 7.408 07/29/2023 1127   PCO2ART 34.7 07/29/2023 1127   PO2ART 87 07/29/2023 1127   HCO3 21.9 07/29/2023 1127   TCO2 23 07/29/2023 1127   ACIDBASEDEF 2.0 07/29/2023 1127   O2SAT 97 07/29/2023 1127     Coagulation Profile: Recent Labs  Lab 07/26/23 1134  INR 1.2    Cardiac Enzymes: No results for input(s): CKTOTAL, CKMB, CKMBINDEX, TROPONINI in the last 168 hours.  HbA1C: Hgb A1c MFr Bld  Date/Time Value Ref Range Status  07/23/2023 12:30 PM 6.3 4.6 - 6.5 % Final    Comment:    Glycemic Control Guidelines for People with Diabetes:Non Diabetic:  <6%Goal of Therapy: <7%Additional Action Suggested:  >8%   08/13/2022 08:43 AM 6.3 4.6 - 6.5 % Final    Comment:    Glycemic Control Guidelines for  People with Diabetes:Non Diabetic:  <6%Goal of Therapy: <7%Additional Action Suggested:  >8%     CBG: Recent Labs  Lab 07/29/23 1115 07/29/23 1543 07/29/23 2000 07/29/23 2357 07/30/23 0358  GLUCAP 132* 140* 148* 130* 127*       Critical care time 38 minutes  Deward Eastern, AGACNP-BC [facility]  See Amion for personal pager PCCM on call pager [phone] until 7pm. Please call Elink 7p-7a. [phone]  07/30/2023 7:27 AM

## 2023-07-30 NOTE — Consult Note (Cosign Needed Addendum)
 Consultation Note Date: 07/30/2023   Patient Name: Rachel Carlson  DOB: Jul 25, 1945  MRN: [id number]  Age / Sex: 78 y.o., female  PCP: Randeen Laine LABOR, MD Referring Physician: Stroke, Md, MD  Reason for Consultation: Establishing goals of care  HPI/Patient Profile: 78 y.o. 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 M1 occlusion with unsuccessful occlusion. Hospitalization complicated by low grade fever with increased secretions. Also with plans to tap malignant pleural effusion.   Clinical Assessment and Goals of Care: Consult received and chart review completed. I discussed with PCCM. I met today with family after they went for lunch while Rachel Carlson had thoracentesis. Rachel Carlson is sedated on propofol  and they will await to begin to wean shortly to give some time to allow rest after procedure.   I had a long talk with husband, daughter, and son. Rachel Carlson has been main caregiver for her husband as he has had a rare form of ALS for 15 years now. Family shared with me about Rachel Carlson and her love for animals and gardening. They share about her intelligence and accomplishments. She and her husband met in vet school.   Family shares with me about concerns with outpatient palliative care and confusion regarding DNR. Son does share that he offered to go with his mother to her palliative appointment but she opted to go alone. She came back with paperwork and they later noted DNR. The confusion comes from Rachel Carlson never  sharing with her family her desire for DNR. I did spend time sharing with family that I discussed this with Rachel Carlson when I met her in April. I shared that we did discuss and she did tell me that she did not desire resuscitation. We had no way of knowing that this would be her situation. We did know that she has terminal cancer and that the expectation would be a poor outcome in the setting of progressing cancer. Family express understanding but also feel that this is complicated by unknown territory with stroke. We did discuss that now she has stroke on top of her cancer. They understand that her path forward is very concerning for how well she will do off ventilator support.   We spent time reviewing the importance of having a discussion and plan in anticipation that we can hopefully extubate tomorrow. We reviewed the importance of knowing if we will plan to reintubate vs focus on her comfort if she fails. They feel very torn as Rachel Carlson never expressed her wishes. I shared that I am not surprised knowing how difficult it was for her to discuss with me. I encouraged them to consider the path that reintubation would lead us  down  and if Rachel Carlson would desire this. Family are thoughtful but need more time to discuss and consider options. We discussed hope for the best but planning for the worse.   We agree to meet again and continue conversation tomorrow midmorning. All questions/concerns addressed. Emotional support provided.   Primary Decision Maker NEXT OF KIN husband     SUMMARY OF RECOMMENDATIONS   - Considering wishes moving forward especially reintubation in the event of failing extubation trial  Code Status/Advance Care Planning: Full code - under discussion   Symptom Management:  Per PCCM  Prognosis:  Overall prognosis poor in the setting of stroke and advanced cancer.   Discharge Planning: To Be Determined      Primary Diagnoses: Present on Admission:  Stroke [facility])   I have reviewed  the medical record, interviewed the patient and family, and examined the patient. The following aspects are pertinent.  Past Medical History:  Diagnosis Date   Blood transfusion without reported diagnosis    Chronic right ear pain    normal MRI   Diverticulosis    DVT of leg (deep venous thrombosis) (HCC)    Esophageal stricture    Family history of breast cancer    Family history of colon cancer    Family history of melanoma    Family history of prostate cancer    Fibroids 1998   uterine, history of (left ovaries)   GERD (gastroesophageal reflux disease)    History of hiatal hernia    History of uterine prolapse    Hx of adenomatous polyp of colon 10/19/2014   Hyperlipidemia    Hypertension    borderline no meds diet and exercise   Nausea and vomiting 05/19/2023   Neuromuscular disorder (HCC)    neuropathy from chemo   Obesity    Ovarian cancer (HCC)    Peritoneal carcinomatosis (HCC)    PVC (premature ventricular contraction)    first dx by holter in 1980's, echo (6/10) EF 55-60%, normal diastolic fxn, normal size RV and fxn, mild MR, PASP   Squamous cell carcinoma of arm    ovarian   Tubal pregnancy    Ulcerative proctosigmoiditis (HCC)    Urge urinary incontinence 06/03/2007   Qualifier: Diagnosis of  By: Cecilie CMA, NANNIE Ivy     Social History   Socioeconomic History   Marital status: Married    Spouse name: Not on file   Number of children: 3   Years of education: Not on file   Highest education level: Not on file  Occupational History   Occupation: International aid/development worker    Comment: retired 2009    Employer: unemployed  Tobacco Use   Smoking status: Never   Smokeless tobacco: Never  Vaping Use   Vaping status: Never Used  Substance and Sexual Activity   Alcohol  use: No    Alcohol /week: 0.0 standard drinks of alcohol     Comment: rarely beer   Drug use: No   Sexual activity: Not Currently  Other Topics Concern   Not on file  Social History  Narrative   Retired International aid/development worker, 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)   Grown children   Never smoker no alcohol  tobacco or drug use   Social Drivers of Corporate investment banker Strain: Low Risk  (08/28/2022)   Overall Financial Resource Strain (CARDIA)    Difficulty of Paying Living Expenses: Not hard at all  Food Insecurity: Patient Unable To Answer (07/26/2023)   Hunger Vital Sign  Worried About Programme researcher, broadcasting/film/video in the Last Year: Patient unable to answer    Ran Out of Food in the Last Year: Patient unable to answer  Transportation Needs: No Transportation Needs (05/16/2023)   PRAPARE - Administrator, Civil Service (Medical): No    Lack of Transportation (Non-Medical): No  Physical Activity: Inactive (08/28/2022)   Exercise Vital Sign    Days of Exercise per Week: 0 days    Minutes of Exercise per Session: 0 min  Stress: No Stress Concern Present (08/28/2022)   [employer] of Occupational Health - Occupational Stress Questionnaire    Feeling of Stress : Not at all  Social Connections: Moderately Integrated (05/16/2023)   Social Connection and Isolation Panel    Frequency of Communication with Friends and Family: More than three times a week    Frequency of Social Gatherings with Friends and Family: More than three times a week    Attends Religious Services: Never    Database administrator or Organizations: Yes    Attends Engineer, structural: More than 4 times per year    Marital Status: Married   Family History  Problem Relation Age of Onset   Heart attack Mother 14   Transient ischemic attack Mother    Diabetes type II Mother    Sudden death Mother        died age 33   Diabetes Mother    Coronary artery disease Mother    Skin cancer Mother    Barrett's esophagus Mother    Sudden death Father 35       coronary arteriosclerosis on death certificate   Breast cancer Sister        dx in her 2s   Colon polyps Sister         adenomatous   Arrhythmia Brother    Colon cancer Brother 39   Congestive Heart Failure Maternal Grandmother    Prostate cancer Maternal Grandfather        dx in 43s   Tuberculosis Paternal Grandfather    Ehlers-Danlos syndrome Daughter    Colon cancer Son 63       lynch syndrome   Other Son        parotid gland tumor   Skin cancer Maternal Aunt    Barrett's esophagus Maternal Aunt    Melanoma Maternal Uncle    Colon cancer Cousin        mid 33s; maternal cousin   Colon cancer Other        MGMs brother   Esophageal cancer Neg Hx    Stomach cancer Neg Hx    Rectal cancer Neg Hx    Uterine cancer Neg Hx    Bladder Cancer Neg Hx    Scheduled Meds:  Chlorhexidine  Gluconate Cloth  6 each Topical Daily   docusate  100 mg Per Tube BID   famotidine   20 mg Per Tube BID   feeding supplement (PROSource TF20)  60 mL Per Tube Daily   multivitamin with minerals  1 tablet Per Tube Daily   mouth rinse  15 mL Mouth Rinse Q2H   polyethylene glycol  17 g Per Tube Daily   potassium chloride   40 mEq Per Tube Q6H   rosuvastatin   20 mg Per Tube QHS   thiamine   100 mg Per Tube Daily   Continuous Infusions:  ceFEPime (MAXIPIME) IV     clevidipine     feeding supplement (OSMOLITE 1.2 CAL) 55 mL/hr  at 07/30/23 0800   heparin  800 Units/hr (07/30/23 0800)   propofol  (DIPRIVAN ) infusion 30 mcg/kg/min (07/30/23 0800)   sodium chloride  (hypertonic) 75 mL/hr at 07/30/23 0800   vancomycin      [START ON 07/31/2023] vancomycin      PRN Meds:.acetaminophen  **OR** acetaminophen  (TYLENOL ) oral liquid 160 mg/5 mL **OR** acetaminophen , fentaNYL  (SUBLIMAZE ) injection, fentaNYL  (SUBLIMAZE ) injection, mouth rinse, senna-docusate Allergies  Allergen Reactions   Aspartame Other (See Comments)    Intolerance to artifical sweeteners, pt states caused burning of bladder   Levaquin  [Levofloxacin ] Nausea Only   Pantoprazole  Sodium Diarrhea    Abdominal pain   Ranitidine Diarrhea and Other (See Comments)     Abdominal pain, also   Omeprazole  Diarrhea and Other (See Comments)    Abdominal pain, also   Sulfasalazine  Rash and Other (See Comments)    Fever, chills, headache, muscle pain also   Review of Systems  Unable to perform ROS: Acuity of condition    Physical Exam Vitals and nursing note reviewed.  Constitutional:      Interventions: She is intubated.     Comments: Sedated on vent   Cardiovascular:     Rate and Rhythm: Tachycardia present.  Pulmonary:     Effort: No tachypnea or accessory muscle usage. She is intubated.  Abdominal:     General: Abdomen is flat.     Palpations: Abdomen is soft.     Comments: + ostomy   Neurological:     Comments: Sedated on vent    Vital Signs: BP (!) 153/91 (BP Location: Right Arm)   Pulse (!) 105   Temp 99.5 F (37.5 C) (Axillary)   Resp (!) 33   Ht 5' 2 (1.575 m)   Wt 76.5 kg   LMP 05/29/1996   SpO2 97%   BMI 30.85 kg/m  Pain Scale: CPOT POSS *See Group Information*: 2-Acceptable,Slightly drowsy, easily aroused Pain Score: Asleep   SpO2: SpO2: 97 % O2 Device:SpO2: 97 % O2 Flow Rate: .   IO: Intake/output summary:  Intake/Output Summary (Last 24 hours) at 07/30/2023 0820 Last data filed at 07/30/2023 0800 Gross per 24 hour  Intake 3886.78 ml  Output 2025 ml  Net 1861.78 ml    LBM: Last BM Date : 07/30/23 Baseline Weight: Weight: 74.2 kg Most recent weight: Weight: 76.5 kg     Palliative Assessment/Data:    Time Total: 90 min  Greater than 50%  of this time was spent counseling and coordinating care related to the above assessment and plan.  Signed by: Bernarda Kitty, NP Palliative Medicine Team Pager # [phone] (M-F 8a-5p) Team Phone # [phone] (Nights/Weekends)

## 2023-07-30 NOTE — Procedures (Signed)
 Thoracentesis  Procedure Note  SANDA DEJOY  [id number]  May 27, 1945  Date:07/30/23  Time:12:57 PM   Provider Performing:Leialoha Hanna Volanda   Procedure: Thoracentesis with imaging guidance (67444)  Indication(s) Pleural Effusion  Consent Risks of the procedure as well as the alternatives and risks of each were explained to the patient and/or caregiver.  Consent for the procedure was obtained and is signed in the bedside chart  Anesthesia Topical only with 1% lidocaine     Time Out Verified patient identification, verified procedure, site/side was marked, verified correct patient position, special equipment/implants available, medications/allergies/relevant history reviewed, required imaging and test results available.   Sterile Technique Maximal sterile technique including full sterile barrier drape, hand hygiene, sterile gown, sterile gloves, mask, hair covering, sterile ultrasound probe cover (if used).  Procedure Description Ultrasound was used to identify appropriate pleural anatomy for placement and overlying skin marked.  Area of drainage cleaned and draped in sterile fashion. Lidocaine  was used to anesthetize the skin and subcutaneous tissue.  1400 cc's of serous/brown appearing fluid was drained from the left pleural space. Catheter then removed and bandaid applied to site.   Complications/Tolerance None; patient tolerated the procedure well. Chest X-ray is ordered to confirm no post-procedural complication.   EBL Minimal   Specimen(s) Pleural fluid

## 2023-07-30 DEATH — deceased

## 2023-07-31 DIAGNOSIS — J9690 Respiratory failure, unspecified, unspecified whether with hypoxia or hypercapnia: Secondary | ICD-10-CM

## 2023-07-31 DIAGNOSIS — I639 Cerebral infarction, unspecified: Secondary | ICD-10-CM

## 2023-07-31 DIAGNOSIS — Z7189 Other specified counseling: Secondary | ICD-10-CM | POA: Diagnosis not present

## 2023-07-31 DIAGNOSIS — J9601 Acute respiratory failure with hypoxia: Secondary | ICD-10-CM | POA: Diagnosis not present

## 2023-07-31 DIAGNOSIS — I63431 Cerebral infarction due to embolism of right posterior cerebral artery: Secondary | ICD-10-CM | POA: Diagnosis not present

## 2023-07-31 DIAGNOSIS — I63411 Cerebral infarction due to embolism of right middle cerebral artery: Secondary | ICD-10-CM | POA: Diagnosis not present

## 2023-07-31 DIAGNOSIS — C786 Secondary malignant neoplasm of retroperitoneum and peritoneum: Secondary | ICD-10-CM | POA: Diagnosis not present

## 2023-07-31 DIAGNOSIS — I69391 Dysphagia following cerebral infarction: Secondary | ICD-10-CM | POA: Diagnosis not present

## 2023-07-31 DIAGNOSIS — Z515 Encounter for palliative care: Secondary | ICD-10-CM | POA: Diagnosis not present

## 2023-07-31 LAB — GLUCOSE, CAPILLARY
Glucose-Capillary: 143 mg/dL — ABNORMAL HIGH (ref 70–99)
Glucose-Capillary: 148 mg/dL — ABNORMAL HIGH (ref 70–99)
Glucose-Capillary: 156 mg/dL — ABNORMAL HIGH (ref 70–99)
Glucose-Capillary: 162 mg/dL — ABNORMAL HIGH (ref 70–99)
Glucose-Capillary: 162 mg/dL — ABNORMAL HIGH (ref 70–99)
Glucose-Capillary: 172 mg/dL — ABNORMAL HIGH (ref 70–99)

## 2023-07-31 LAB — CBC
HCT: 31.9 % — ABNORMAL LOW (ref 36.0–46.0)
Hemoglobin: 10.1 g/dL — ABNORMAL LOW (ref 12.0–15.0)
MCH: 32.1 pg (ref 26.0–34.0)
MCHC: 31.7 g/dL (ref 30.0–36.0)
MCV: 101.3 fL — ABNORMAL HIGH (ref 80.0–100.0)
Platelets: 238 10*3/uL (ref 150–400)
RBC: 3.15 MIL/uL — ABNORMAL LOW (ref 3.87–5.11)
RDW: 16.3 % — ABNORMAL HIGH (ref 11.5–15.5)
WBC: 5.6 10*3/uL (ref 4.0–10.5)
nRBC: 0 % (ref 0.0–0.2)

## 2023-07-31 LAB — BASIC METABOLIC PANEL WITH GFR
Anion gap: 9 (ref 5–15)
BUN: 19 mg/dL (ref 8–23)
CO2: 24 mmol/L (ref 22–32)
Calcium: 9.7 mg/dL (ref 8.9–10.3)
Chloride: 117 mmol/L — ABNORMAL HIGH (ref 98–111)
Creatinine, Ser: 0.57 mg/dL (ref 0.44–1.00)
GFR, Estimated: >60 mL/min (ref >60)
Glucose, Bld: 176 mg/dL — ABNORMAL HIGH (ref 70–99)
Potassium: 4 mmol/L (ref 3.5–5.1)
Sodium: 150 mmol/L — ABNORMAL HIGH (ref 135–145)

## 2023-07-31 LAB — APTT: aPTT: 68 s — ABNORMAL HIGH (ref 24–36)

## 2023-07-31 LAB — HEPARIN LEVEL (UNFRACTIONATED): Heparin Unfractionated: 0.34 [IU]/mL (ref 0.30–0.70)

## 2023-07-31 MED ORDER — FUROSEMIDE 10 MG/ML IJ SOLN
40.0000 mg | Freq: Once | INTRAMUSCULAR | Status: AC
  Administered 2023-07-31: 40 mg via INTRAVENOUS
  Filled 2023-07-31: qty 4

## 2023-07-31 NOTE — Progress Notes (Signed)
 This chaplain responded to PMT NP-Alicia's referral for spiritual care in the setting of the family's request for a Catholic priest visit.   The chaplain phoned Father Marinell for [facility] with the family at the bedside. The chaplain understands Father Marinell is not available until Thursday at 5-5:30pm.   This chaplain will F/U with the Pt. family Thursday around 11am to confirm a visit time. Father Marinell gave permission to share his phone number for scheduling assistance.   Chaplain Leeroy Hummer [phone]

## 2023-07-31 NOTE — Progress Notes (Signed)
 PHARMACY - ANTICOAGULATION CONSULT NOTE  Pharmacy Consult:  Heparin  Indication:  History of DVT, new CVA  Allergies  Allergen Reactions   Aspartame Other (See Comments)    Intolerance to artifical sweeteners, pt states caused burning of bladder   Levaquin  [Levofloxacin ] Nausea Only   Pantoprazole  Sodium Diarrhea    Abdominal pain   Ranitidine Diarrhea and Other (See Comments)    Abdominal pain, also   Omeprazole  Diarrhea and Other (See Comments)    Abdominal pain, also   Sulfasalazine  Rash and Other (See Comments)    Fever, chills, headache, muscle pain also    Patient Measurements: Height: 5' 2 (157.5 cm) Weight: 76.5 kg (168 lb 10.4 oz) IBW/kg (Calculated) : 50.1 HEPARIN  DW (KG): 66  Vital Signs: Temp: 100.6 F (38.1 C) (07/01 2000) Temp Source: Axillary (07/01 2000) BP: 115/77 (07/01 2300) Pulse Rate: 116 (07/01 2335)  Labs: Recent Labs     0000 07/28/23 0534 07/28/23 0612 07/28/23 1800 07/29/23 0534 07/29/23 0813 07/29/23 0912 07/29/23 1127 07/30/23 0510 07/30/23 0534 07/30/23 2230  HGB   < >  --  9.0*  --   --   --  8.5* 7.1*  --  9.3*  --   HCT   < >  --  28.2*  --   --   --  26.8* 21.0*  --  29.6*  --   PLT  --   --  192  --   --   --  192  --   --  122*  --   APTT  --   --  65*   < >  --  82*  --   --   --  87* 48*  HEPARINUNFRC  --   --  0.76*  --   --  0.69  --   --  0.68  --   --   CREATININE  --  0.45  --   --  0.41*  --   --   --   --  0.47  --    < > = values in this interval not displayed.    Estimated Creatinine Clearance: 58.2 mL/min (by C-G formula based on SCr of 0.47 mg/dL).   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)    Esophageal stricture    Family history of breast cancer    Family history of colon cancer    Family history of melanoma    Family history of  prostate cancer    Fibroids 1998   uterine, history of (left ovaries)   GERD (gastroesophageal reflux disease)    History of hiatal hernia    History of uterine prolapse    Hx of adenomatous polyp of colon 10/19/2014   Hyperlipidemia    Hypertension    borderline no meds diet and exercise   Nausea and vomiting 05/19/2023   Neuromuscular disorder (HCC)    neuropathy from chemo   Obesity    Ovarian cancer (HCC)    Peritoneal carcinomatosis (HCC)    PVC (premature ventricular contraction)    first dx by holter in 1980's, echo (6/10) EF 55-60%, normal diastolic fxn, normal size RV and fxn, mild MR, PASP   Squamous cell carcinoma of arm    ovarian   Tubal pregnancy    Ulcerative proctosigmoiditis (  HCC)    Urge urinary incontinence 06/03/2007   Qualifier: Diagnosis of  By: Cecilie CMA, NANNIE Ivy       Assessment: 59 YOF with history of DVT on Eliquis  PTA, last dose on 6/26 at 2100.  Patient presented as a code stroke and thrombectomy was unsuccessful.  Pharmacy consulted to dose IV heparin  per stroke protocol.  No bleeding reported.  7/2 AM update:  aPTT sub-therapeutic after re-start  Goal of Therapy:  Heparin  level 0.3-0.5 units/ml aPTT 66-85 seconds Monitor platelets by anticoagulation protocol: Yes   Plan:  Inc heparin  to 900 units/hr Check 8 hr heparin  level and aPTT Daily heparin  level, aPTT and CBC Monitor for bleeding  Lynwood Mckusick, PharmD, BCPS Clinical Pharmacist Phone: [phone]

## 2023-07-31 NOTE — TOC Progression Note (Signed)
 Transition of Care [facility]) - Progression Note    Patient Details  Name: Rachel Carlson MRN: [id number] Date of Birth: Dec 08, 1945  Transition of Care [facility]) CM/SW Contact  Inocente GORMAN Kindle, LCSW Phone Number: 07/31/2023, 9:09 AM  Clinical Narrative:    CSW continuing to follow.    Expected Discharge Plan: Skilled Nursing Facility Barriers to Discharge: Continued Medical Work up, English as a second language teacher, SNF Pending bed offer  Expected Discharge Plan and Services In-house Referral: Clinical Social Work     Living arrangements for the past 2 months: Single Family Home                                       Social Determinants of Health (SDOH) Interventions SDOH Screenings   Food Insecurity: Patient Unable To Answer (07/26/2023)  Housing: Unknown (07/01/2023)   Received from [facility]  Transportation Needs: No Transportation Needs (05/16/2023)  Utilities: Not At Risk (05/16/2023)  Alcohol  Screen: Low Risk  (08/28/2022)  Depression (PHQ2-9): Low Risk  (07/23/2023)  Financial Resource Strain: Low Risk  (08/28/2022)  Physical Activity: Inactive (08/28/2022)  Social Connections: Moderately Integrated (05/16/2023)  Stress: No Stress Concern Present (08/28/2022)  Tobacco Use: Low Risk  (07/23/2023)  Health Literacy: Adequate Health Literacy (08/28/2022)    Readmission Risk Interventions    07/29/2023   12:37 PM 06/01/2023    2:31 PM 05/17/2023    1:19 PM  Readmission Risk Prevention Plan  Transportation Screening Complete Complete Complete  PCP or Specialist Appt within 3-5 Days   Complete  HRI or Home Care Consult   Complete  Social Work Consult for Recovery Care Planning/Counseling   Complete  Palliative Care Screening   Not Applicable  Medication Review Oceanographer) Complete Complete Complete  PCP or Specialist appointment within 3-5 days of discharge Complete Complete   HRI or Home Care Consult Complete Complete   SW Recovery Care/Counseling  Consult Complete Complete   Palliative Care Screening Not Applicable Complete   Skilled Nursing Facility Complete Not Applicable

## 2023-07-31 NOTE — Progress Notes (Signed)
 Nutrition Follow Up  DOCUMENTATION CODES:   Not applicable  INTERVENTION:   Continue tube feeding via OGT: Osmolite 1.2 at 55 ml/hr (1320 ml per day) Prosource TF20 60 ml daily  Provides 1664 kcal, 93 gm protein, 1065 ml free water  daily  MVI with minerals daily   100 mg Thiamine  x 5 days (day 5 of 5)  Monitor pt's status and GOC discussion  NUTRITION DIAGNOSIS:   Inadequate oral intake related to acute illness as evidenced by NPO status. -Remains applicable  GOAL:   Patient will meet greater than or equal to 90% of their needs -Met with tube feeds at goal rate  MONITOR:   TF tolerance, Diet advancement, Labs, I & O's  REASON FOR ASSESSMENT:   Consult, Ventilator Enteral/tube feeding initiation and management  ASSESSMENT:  78 y.o. female with PMH of ovarian cancer with peritoneal carcinomatosis undergoing chemo,s/p colostomy and  preparing for colostomy revision, left pleural effusion s/p thoracentesis, and HLD presented to ED for code stroke, has left sided weakness.  6/28: Admitted to ICU, s/p thrombectomy with unsuccessful revasc remained intubated post procedure; MRI showed large acute MCA infarct w/ 4mm left shift 7/1: thoracentesis removed  Pt discussed during ICU rounds with MD and RN. MD reports GOC discussions will continue to take place, will monitor any decisions/changes to treatment plan. Currently unable to wean from vent due to increased secretions and pneumonia. Tube feeds running at goal with no reports of any tolerance issues, will continue to monitor. No family at bedside during follow up. Conducted nutrition focused physical exam which showed moderate muscle depletions, but unable to assess all subcutaneous fat areas due to ventilation and OG tube placement. Unable to diagnose malnutrition at this time, but suspect facial depletions in combination with upper arm depletions. Suspect depletions seen related to progressive cancer.  Patient is  currently intubated on ventilator support MV: 10.2 L/min Temp (24hrs), Avg:100.2 F (37.9 C), Min:99 F (37.2 C), Max:100.9 F (38.3 C)  MAP (cuff):  Propofol : 13.32 ml/hr  Admit weight: 74.2kg  Current weight: 76.5kg   Intake/Output Summary (Last 24 hours) at 07/31/2023 0936 Last data filed at 07/31/2023 0600 Gross per 24 hour  Intake 2337.95 ml  Output 3700 ml  Net -1362.05 ml   Net IO Since Admission: -315.21 mL [07/31/23 0936]  Drains/Lines: OG tube: gastric  Implanted Port R chest Endotracheal tube UOP (catheter): 3,875mL Colostomy LUQ:  Nutritionally Relevant Medications: Scheduled Meds:  docusate  100 mg Per Tube BID   famotidine   20 mg Per Tube BID   feeding supplement (PROSource TF20)  60 mL Per Tube Daily   furosemide  40 mg Intravenous Once   multivitamin with minerals  1 tablet Per Tube Daily   polyethylene glycol  17 g Per Tube Daily   thiamine   100 mg Per Tube Daily   Continuous Infusions:  ceFEPime (MAXIPIME) IV 2 g (07/31/23 0904)   feeding supplement (OSMOLITE 1.2 CAL) 55 mL/hr at 07/31/23 0525   propofol  (DIPRIVAN ) infusion 30 mcg/kg/min (07/31/23 0628)    Labs Reviewed: Sodium 150/ Chloride 117 CBG ranges from 110-162 mg/dL over the last 24 hours HgbA1c 6.3  NUTRITION - FOCUSED PHYSICAL EXAM:  Flowsheet Row Most Recent Value  Orbital Region Unable to assess  [vent]  Upper Arm Region Moderate depletion  Thoracic and Lumbar Region No depletion  Buccal Region Unable to assess  [vent]  Temple Region Moderate depletion  Clavicle Bone Region Moderate depletion  Clavicle and Acromion Bone  Region Moderate depletion  Scapular Bone Region Moderate depletion  Dorsal Hand Unable to assess  [mittens]  Patellar Region Mild depletion  Anterior Thigh Region Mild depletion  Posterior Calf Region Mild depletion  Edema (RD Assessment) None  Hair Reviewed  Eyes Unable to assess  Mouth Unable to assess  Skin Reviewed  [varicos veins]   Nails Unable to assess    Diet Order:   Diet Order             Diet NPO time specified  Diet effective now                   EDUCATION NEEDS:   Not appropriate for education at this time  Skin:  Skin Assessment: Reviewed RN Assessment  Last BM:  x 24 hours via colostomy  Height:   Ht Readings from Last 1 Encounters:  07/26/23 5' 2 (1.575 m)   Weight:   Wt Readings from Last 1 Encounters:  07/30/23 76.5 kg    BMI:  Body mass index is 30.85 kg/m.  Estimated Nutritional Needs:   Kcal:  1600-1800 kcal  Protein:  80-100 gm  Fluid:  >1.6L/day  Josette Glance, MS, RDN, LDN Clinical Dietitian I Please reach out via secure chat

## 2023-07-31 NOTE — Progress Notes (Signed)
 PHARMACY - ANTICOAGULATION CONSULT NOTE  Pharmacy Consult:  Heparin  Indication:  History of DVT, new CVA  Allergies  Allergen Reactions   Aspartame Other (See Comments)    Intolerance to artifical sweeteners, pt states caused burning of bladder   Levaquin  [Levofloxacin ] Nausea Only   Pantoprazole  Sodium Diarrhea    Abdominal pain   Ranitidine Diarrhea and Other (See Comments)    Abdominal pain, also   Omeprazole  Diarrhea and Other (See Comments)    Abdominal pain, also   Sulfasalazine  Rash and Other (See Comments)    Fever, chills, headache, muscle pain also    Patient Measurements: Height: 5' 2 (157.5 cm) Weight: 76.5 kg (168 lb 10.4 oz) IBW/kg (Calculated) : 50.1 HEPARIN  DW (KG): 66  Vital Signs: Temp: 100.5 F (38.1 C) (07/02 0800) Temp Source: Axillary (07/02 0800) BP: 96/59 (07/02 1109) Pulse Rate: 108 (07/02 1109)  Labs: Recent Labs    07/29/23 0534 07/29/23 0813 07/29/23 0912 07/29/23 0912 07/29/23 1127 07/30/23 0510 07/30/23 0534 07/30/23 2230 07/31/23 0422 07/31/23 0924  HGB  --   --  8.5*   < > 7.1*  --  9.3*  --  10.1*  --   HCT  --   --  26.8*   < > 21.0*  --  29.6*  --  31.9*  --   PLT  --   --  192  --   --   --  122*  --  238  --   APTT  --  82*  --   --   --   --  87* 48*  --  68*  HEPARINUNFRC  --  0.69  --   --   --  0.68  --   --   --  0.34  CREATININE 0.41*  --   --   --   --   --  0.47  --  0.57  --    < > = values in this interval not displayed.    Estimated Creatinine Clearance: 58.2 mL/min (by C-G formula based on SCr of 0.57 mg/dL).   Assessment: 74 YOF with history of DVT on Eliquis  PTA, last dose on 6/26 at 2100.  Patient presented as a code stroke and thrombectomy was unsuccessful.  Pharmacy consulted to dose IV heparin  per stroke protocol.  No bleeding reported.  aPTT 68 sec (therapeutic) on infusion at 900 units/hr. Heparin  level therapeutic at 0.34 (appears to be correlating with aPTT). No bleeding noted.  Goal of Therapy:   Heparin  level 0.3-0.5 units/ml aPTT 66-85 seconds Monitor platelets by anticoagulation protocol: Yes   Plan:  Continue heparin  at 900 units/hr Will f/u daily aPTT and heparin  level in a.m. Check aPTT one more time to verify correlating.  Vito Ralph, PharmD, BCPS Please see amion for complete clinical pharmacist phone list 07/31/2023 11:56 AM

## 2023-07-31 NOTE — Progress Notes (Signed)
 OT Cancellation Note  Patient Details Name: Rachel Carlson MRN: [id number] DOB: January 22, 1946   Cancelled Treatment:    Reason Eval/Treat Not Completed: Medical issues which prohibited therapy (intubated, sedated, unable to participate in therapy at this time per RN. Will hold and follow up next date for OT tx as schedule permits)  Kaiyon Hynes K, OTD, OTR/L SecureChat Preferred Acute Rehab [phone]   Laneta MARLA Pereyra 07/31/2023, 8:36 AM

## 2023-07-31 NOTE — Plan of Care (Signed)
  Problem: Education: Goal: Knowledge of disease or condition will improve Outcome: Progressing Goal: Knowledge of secondary prevention will improve (MUST DOCUMENT ALL) Outcome: Progressing Goal: Knowledge of patient specific risk factors will improve (DELETE if not current risk factor) Outcome: Progressing   Problem: Ischemic Stroke/TIA Tissue Perfusion: Goal: Complications of ischemic stroke/TIA will be minimized Outcome: Progressing   Problem: Coping: Goal: Will verbalize positive feelings about self Outcome: Progressing Goal: Will identify appropriate support needs Outcome: Progressing   Problem: Self-Care: Goal: Ability to participate in self-care as condition permits will improve Outcome: Progressing Goal: Verbalization of feelings and concerns over difficulty with self-care will improve Outcome: Progressing Goal: Ability to communicate needs accurately will improve Outcome: Progressing   Problem: Clinical Measurements: Goal: Ability to maintain clinical measurements within normal limits will improve Outcome: Progressing Goal: Will remain free from infection Outcome: Progressing Goal: Diagnostic test results will improve Outcome: Progressing Goal: Respiratory complications will improve Outcome: Progressing Goal: Cardiovascular complication will be avoided Outcome: Progressing   Problem: Pain Managment: Goal: General experience of comfort will improve and/or be controlled Outcome: Progressing   Problem: Safety: Goal: Ability to remain free from injury will improve Outcome: Progressing   Problem: Skin Integrity: Goal: Risk for impaired skin integrity will decrease Outcome: Progressing   Problem: Cardiovascular: Goal: Ability to achieve and maintain adequate cardiovascular perfusion will improve Outcome: Progressing Goal: Vascular access site(s) Level 0-1 will be maintained Outcome: Progressing   Problem: Respiratory: Goal: Ability to maintain a clear  airway and adequate ventilation will improve Outcome: Progressing   Problem: Role Relationship: Goal: Method of communication will improve Outcome: Progressing

## 2023-07-31 NOTE — Progress Notes (Signed)
 Palliative:  HPI: 78 y.o. 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 M1 occlusion with unsuccessful occlusion. Hospitalization complicated by low grade fever with increased secretions. Also with plans to tap malignant pleural effusion.    I discussed progress and obstacles with RN. I met today at Rachel Carlson's bedside along with her husband, son, and daughter. Daughter is tearful at Rachel Carlson's side. She asks about a visit from a priest - she reports that she does not know the timing but believes that her mother would want this. We discussed that it does not hurt to be prepared and cover all our bases. She acknowledges that she is not giving up hope but also sees that her mother is worse today and it does not look good. I discussed with our chaplain who is assisting with visit from priest.   I spoke further with husband and son. We were able to discuss that Rachel Carlson is not doing well today and disappointment at her progress. They have been trying to locate paperwork from [place] (husband was [employer]) about funeral arrangements and benefits through them - they have been unsuccessful at locating this so far. We were able to further discuss path forward, code status, and overall poor prognosis. We reviewed that although this is different from the expectation that she would progress from her cancer - unfortunately her prognosis remains poor with significant stroke on top of cancer that we are now unable to treat. Unfortunately this has to be taken into account when thinking about our options moving forward. We  discussed the importance of considering DNR status acknowledging that if she has cardiac arrest especially considering current level of support then there is no good path from there. They are all able to agree but need to further discuss - I encouraged them to let us  know if they wish to put in place DNR to protect Rachel Carlson from CPR in the event of decline.   Husband also is able to acknowledge that he does not feel Rachel Carlson would want to be kept alive on ventilator for weeks and months. We discussed the limitations of ETT. He explains how difficult it is to visit and see her in this state - the strong, intelligent wife and mother they know. Husband reports that it is important that their kids also find peace with this path forward as well. They all seem to be working towards finding peace with poor prognosis and outcome while also trying to hold on to hope for improvement. They expressed poor experiences with hospice - I shared that in her current state I would expect that we would continue to care for her here in the hospital and myself and my team would assist with comfort and symptom management.   I explained that I am not on service after today and will not return until Monday. Husband replies that they will likely not still be here Monday. I explained that I will have my colleague check in with them in my absence. I will keep them in my thoughts and prayers.   All questions/concerns addressed. Emotional support provided.   Exam: Sedated on vent. Breathing more labored.  Increased secretions. Tachycardic. Warm to touch. Abd soft. + ostomy.   Plan: - Considering DNR status and potential extubation to comfort care - Family needs more time to process and find peace with path forward - Ongoing palliative support and discussions  70 min  Bernarda Kitty, NP Palliative Medicine Team Pager [phone] (Please see [email or web address] for schedule) Team Phone [phone]

## 2023-07-31 NOTE — Progress Notes (Signed)
 PT Cancellation Note  Patient Details Name: Rachel Carlson MRN: [id number] DOB: 06/26/1945   Cancelled Treatment:    Reason Eval/Treat Not Completed: (P) Patient not medically ready. Pt intubated and sedated and unable to participate in therapy at this time. Will plan to follow-up another day as able.   Rachel Carlson, PT, DPT Acute Rehabilitation Services  Office: [phone]    Rachel Carlson 07/31/2023, 8:52 AM

## 2023-07-31 NOTE — Progress Notes (Signed)
 NAME:  Rachel Carlson, MRN:  [id number], DOB:  12/09/1945, LOS: 5 ADMISSION DATE:  07/26/2023, CONSULTATION DATE: 07/26/2023 REFERRING MD: Dr. 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 LKW 10:00. Was found sometime after than on the toilet, had falled couldn't get up and had L sided weakness, R gaze, L facial droop.  Imaging in ED w M1 occlusion. In ED was lethargic but able to state that she took eliquis  this morning.   She went for thrombectomy w NIR, unsuccessful revasc.  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)    Esophageal stricture    Family history of breast cancer    Family history of colon cancer    Family history of melanoma    Family history of prostate cancer    Fibroids 1998   uterine, history of (left ovaries)   GERD (gastroesophageal reflux disease)    History of hiatal hernia    History of uterine prolapse    Hx of adenomatous polyp of colon 10/19/2014   Hyperlipidemia    Hypertension    borderline no meds diet and exercise   Nausea and vomiting 05/19/2023   Neuromuscular disorder (HCC)    neuropathy from chemo   Obesity    Ovarian cancer (HCC)    Peritoneal carcinomatosis (HCC)    PVC (premature ventricular contraction)    first dx by holter in 1980's, echo (6/10) EF 55-60%, normal diastolic fxn, normal size RV and fxn, mild MR, PASP    Squamous cell carcinoma of arm    ovarian   Tubal pregnancy    Ulcerative proctosigmoiditis (HCC)    Urge urinary incontinence 06/03/2007   Qualifier: Diagnosis of  By: Cecilie CMA, NANNIE Mitzie Iline Lionel Events: Including procedures, antibiotic start and stop dates in addition to other pertinent events   M1 occlusion, unsuccessful thrombectomy 6/28-on ventilator, weaning well 6/28 MRI-large acute right MCA territory infarct with 4 mm leftward shift, occlusion of right M1 MCA 7/1 increased secretions, UA neg, tmax 100.5, PMT consult, s/p left thoracentesis 1400 out  Interim History / Subjective:  Ongoing copious secretions and remains tachypneic  Tmax 100.9  Objective    Blood pressure 112/74, pulse (!) 112, temperature (!) 100.9 F (38.3 C), temperature source Axillary, resp. rate (!) 29, height 5' 2 (1.575 m), weight 76.5 kg, last menstrual period 05/29/1996, SpO2 100%.    Vent Mode: PRVC FiO2 (%):  [40 %-60 %] 60 % Set Rate:  [18 bmp] 18 bmp Vt Set:  [400 mL] 400 mL PEEP:  [5 cmH20] 5 cmH20 Pressure Support:  [8 cmH20] 8 cmH20   Intake/Output Summary (Last 24 hours) at 07/31/2023 0815 Last data filed at 07/31/2023 0600 Gross per 24 hour  Intake 2337.95 ml  Output 3950 ml  Net -1612.05 ml   Filed Weights   07/26/23 1243  07/27/23 0532 07/30/23 0600  Weight: 74 kg 73.5 kg 76.5 kg   Examination:  Propofol  30 General:  critically ill older female sedated on MV HEENT: MM pink/moist, ETT/ OGT, pupils 3/r Neuro: does not open eyes, f/c, no spont movement, flicker L> RLE, no movement in uppers to noxious  CV:  rr, ST  PULM:  tachypneic over MV, copious yellow secretions, rales/ rhonchi on left, diminished in bases L> R  GI: soft, bs hypo, ostomy left with prolapsed stoma- pink Extremities: warm/dry, trace LE edema  Skin: no rashes  Tmax 100.9  Labs> Na 150, K 4, Cl 117, sCr 0.57, WBC  5.6, H/H 10.1/31.9  UOP 3.8L/ 24hrs - Net -   stool   Resolved problem list   Assessment and Plan   M1 occlusion and PCA territory infarcts with cytotoxic edema with unsuccessful thrombectomy  - CTH 7/2 stable - per Stroke team -HTS per neuro, goal 150-155, q6hr Na checks - serial neuro exams - maintain neuro protective measures   Hypoxemic respiratory failure in setting of able PNA +/- atelectasis  - cont full MV support, 4-8cc/kg IBW with goal Pplat <30 and DP<15  - weaned for few hours 7/1 - VAP prevention protocol/ PPI - PAD protocol for sedation> propofol / prn fentanyl  w/bowel regimen - prn CXR/ ABG - wean back down FiO2 as able for SpO2 >92%, currently on 60% - need to clarify GOC/ reintubation prior to extubation attempts - follow trach asp - abx as below   - diurese as hemodynamically/ renal tolerated   HTN HLD - EF 60-65% - goal SBP < 180 - statin    Sepsis Leukopenia, resolved - Increased tracheal secretion production 7/1. UA neg, CXR bibasilar opacities  - WBC normalized, cont to trend WBC/ fever curve - MRSA PCR neg, stop vanc, cont cefepime - follow cultures  - tylenol  prn   Uterine cancer with peritoneal carcinomatosis - Supportive care - Holding home niraparib    Malignant pleural effusion - s/p thoracentesis 7/1, 1400 ml off> GS neg, follow culture, exudative by Lights criteria    Left gastrocnemius/calf vien DVT on Eliquis  - LE dopplers neg for DVT 6/27 - holding eliquis , heparin  per pharmacy    GOC - ongoing discussions with family, appreciate PMT assistance.  Need to clarify goals prior to extubation attempts   Best Practice (right click and Reselect all SmartList Selections daily)   Diet/type: tubefeeds DVT prophylaxis systemic heparin  Pressure ulcer(s): N/A GI prophylaxis: N/A Lines: N/A Foley:  N/A Code Status:  full code Last date of multidisciplinary goals of care discussion [spoke with son and spouse at bedside]  Pending 7/2, no family at bedside   Labs    CBC: Recent Labs  Lab 07/26/23 1134 07/26/23 1140 07/28/23 0612 07/29/23 0912 07/29/23 1127 07/30/23 0534 07/31/23 0422  WBC 5.0  --  3.8* 3.6*  --  3.2* 5.6  NEUTROABS 4.6  --   --   --   --   --   --   HGB 9.8*   < > 9.0* 8.5* 7.1* 9.3* 10.1*  HCT 29.1*   < > 28.2* 26.8* 21.0* 29.6* 31.9*  MCV 95.4  --  100.4* 100.4*  --  103.1* 101.3*  PLT 164  --  192 192  --  122* 238   < > = values in this interval not displayed.    Basic Metabolic Panel: Recent Labs  Lab 07/26/23 1134 07/26/23 1140 07/26/23 1524 07/28/23 0534 07/28/23 0612 07/29/23 0534 07/29/23 1127  07/29/23 1134 07/29/23 1724 07/29/23 2325 07/30/23 0534 07/30/23 1125 07/31/23 0422  NA 136 137   < > 152*   < > 149* 154*   < > 155* 156* 156* 158* 150*  K 3.6 3.7   < > 3.0*  --  2.8* 3.5  --   --   --  3.3*  --  4.0  CL 105 101  --  125*  --  118*  --   --   --   --  125*  --  117*  CO2 20*  --   --  21*  --  21*  --   --   --   --  25  --  24  GLUCOSE 161* 136*  --  159*  --  133*  --   --   --   --  140*  --  176*  BUN 9 15  --  7*  --  9  --   --   --   --  11  --  19  CREATININE 0.50 0.60  --  0.45  --  0.41*  --   --   --   --  0.47  --  0.57  CALCIUM  8.5*  --   --  8.8*  --  8.9  --   --   --   --  9.1  --  9.7  MG  --   --   --   --   --   --   --   --   --   --  2.1  --   --   PHOS  --   --   --   --   --   --   --   --   --   --  3.6  --   --    < > = values in this interval not displayed.   GFR: Estimated Creatinine Clearance: 58.2 mL/min (by C-G formula based on SCr of 0.57 mg/dL). Recent Labs  Lab 07/28/23 0612 07/29/23 0912 07/30/23 0534 07/31/23 0422  WBC 3.8* 3.6* 3.2* 5.6    Liver Function Tests: Recent Labs  Lab 07/26/23 1134 07/30/23 1257  AST 16  --   ALT 11  --   ALKPHOS 58  --   BILITOT 0.6  --   PROT 4.4* 5.4*  ALBUMIN  3.1*  --    No results for input(s): LIPASE, AMYLASE in the last 168 hours. No results for input(s): AMMONIA in the last 168 hours.  ABG     Component Value Date/Time   PHART 7.408 07/29/2023 1127   PCO2ART 34.7 07/29/2023 1127   PO2ART 87 07/29/2023 1127   HCO3 21.9 07/29/2023 1127   TCO2 23 07/29/2023 1127   ACIDBASEDEF 2.0 07/29/2023 1127   O2SAT 97 07/29/2023 1127     Coagulation Profile: Recent Labs  Lab 07/26/23 1134  INR 1.2    Cardiac Enzymes: No results for input(s): CKTOTAL, CKMB, CKMBINDEX, TROPONINI in the last 168 hours.  HbA1C: Hgb A1c MFr Bld  Date/Time Value Ref Range Status  07/23/2023 12:30 PM 6.3 4.6 - 6.5 % Final    Comment:    Glycemic Control Guidelines for People with Diabetes:Non Diabetic:  <6%Goal of Therapy: <7%Additional Action Suggested:  >8%   08/13/2022 08:43 AM 6.3 4.6 - 6.5 % Final    Comment:    Glycemic Control Guidelines for People with Diabetes:Non Diabetic:  <6%Goal of Therapy: <7%Additional Action Suggested:  >  8%     CBG: Recent Labs  Lab 07/30/23 1526 07/30/23 1945 07/31/23 0007 07/31/23 0409 07/31/23 0809  GLUCAP 110* 145* 143* 162* 162*       Critical care time 38 minutes     Lyle Pesa, MSN, AG-ACNP-BC [facility] 07/31/2023, 8:15 AM  See Amion for pager If no response to pager , please call [phone] until 7pm After 7:00 pm call Elink  [phone]

## 2023-07-31 NOTE — Progress Notes (Addendum)
 STROKE TEAM PROGRESS NOTE   SIGNIFICANT HOSPITAL EVENTS 6/27- Admitted to ICU, intubated   INTERIM HISTORY/SUBJECTIVE Patient remains hemodynamically stable Palliative discussion - Full code, okay with reintubation per discussion yesterday. Continue conversation 7/2. Thick secretions today, likely unable to extubate. Tmax 38.1C  OBJECTIVE  CBC    Component Value Date/Time   WBC 5.6 07/31/2023 0422   RBC 3.15 (L) 07/31/2023 0422   HGB 10.1 (L) 07/31/2023 0422   HGB 11.0 (L) 04/26/2023 1043   HCT 31.9 (L) 07/31/2023 0422   PLT 238 07/31/2023 0422   PLT 199 04/26/2023 1043   MCV 101.3 (H) 07/31/2023 0422   MCH 32.1 07/31/2023 0422   MCHC 31.7 07/31/2023 0422   RDW 16.3 (H) 07/31/2023 0422   LYMPHSABS 0.2 (L) 07/26/2023 1134   MONOABS 0.1 07/26/2023 1134   EOSABS 0.0 07/26/2023 1134   BASOSABS 0.0 07/26/2023 1134    BMET    Component Value Date/Time   NA 150 (H) 07/31/2023 0422   K 4.0 07/31/2023 0422   CL 117 (H) 07/31/2023 0422   CO2 24 07/31/2023 0422   GLUCOSE 176 (H) 07/31/2023 0422   BUN 19 07/31/2023 0422   CREATININE 0.57 07/31/2023 0422   CREATININE 0.64 04/26/2023 1043   CALCIUM  9.7 07/31/2023 0422   GFRNONAA >60 07/31/2023 0422   GFRNONAA >60 04/26/2023 1043    IMAGING past 24 hours DG Chest 1 View Result Date: 07/30/2023 CLINICAL DATA:  Hypoxia EXAM: CHEST  1 VIEW COMPARISON:  07/30/2023 at 12:45 p.m. FINDINGS: Right chest wall Port-A-Cath tip in the right atrium. Endotracheal tube tip in the mid intrathoracic trachea 4.9 cm from the carina. Enteric tube tip in the stomach. Stable cardiomediastinal silhouette. Aortic atherosclerotic calcification. Layering bilateral pleural effusions. Pulmonary vascular congestion. Bilateral basilar airspace opacities. No pneumothorax. IMPRESSION: 1. Layering bilateral pleural effusions with basilar airspace opacities similar to prior. 2. Unchanged lines and tubes. Electronically Signed   By: Norman Gatlin M.D.   On:  07/30/2023 19:06   DG Chest Port 1 View Result Date: 07/30/2023 CLINICAL DATA:  Post thoracentesis. EXAM: PORTABLE CHEST 1 VIEW COMPARISON:  Radiographs 07/29/2023 and 07/26/2023.  CT 03/18/2023. FINDINGS: 1245 hours. Endotracheal tube, enteric tube and right IJ Port-A-Cath are unchanged in position. Left pleural effusion has decreased in volume following presumed left-sided thoracentesis. There are residual small left greater than right pleural effusions with associated bibasilar pulmonary opacities. No evidence of pneumothorax. The heart size and mediastinal contours are stable. IMPRESSION: 1. Decreased volume of left pleural effusion following presumed left-sided thoracentesis. No evidence of pneumothorax. 2. Residual small left greater than right pleural effusions with associated bibasilar pulmonary opacities. Electronically Signed   By: Elsie Perone M.D.   On: 07/30/2023 13:24     Vitals:   07/31/23 0427 07/31/23 0500 07/31/23 0600 07/31/23 0800  BP:  103/70 112/74   Pulse:  (!) 109 (!) 112   Resp:  (!) 27 (!) 29   Temp:    (!) 100.5 F (38.1 C)  TempSrc:    Axillary  SpO2: 98% 98% 100%   Weight:      Height:         PHYSICAL EXAM General: Intubated well-nourished, well-developed elderly Caucasian lady in no acute distress CV: Regular rate and rhythm on monitor Respiratory: Respirations synchronous with ventilator   NEURO:  Mental Status: Patient is sedated with low-dose propofol  and intubated.  Pupils are equal round and sluggishly reactive to light, cough reflex is intact, patient will follow commands with  right lower extremity and localizes with right upper extremity.  She does not move left upper extremity today and withdraws bilateral lower extremities to noxious  ASSESSMENT/PLAN  Rachel Carlson is a 78 y.o. female with history of ovarian cancer with peritoneal carcinomatosis undergoing chemo, hx of DVT in 06/2022 on eliquis , s/p colostomy and in preparing for  colostomy revision for prolapse and possible parastromal hernia repair soon presented, left pleural effusion s/p thoracentesis, overreactive bladder and HLD presented to ED for code stroke. 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 pfo Code Stroke CT head- Acute right MCA infarct.  ASPECTS of 7. No intracranial hemorrhage. CTA head & neck - Occlusion of the proximal/mid M1 segment right MCA with intraluminal filling defect concerning for thrombus. Few diminutive caliber M2 and M3 branches noted likely filled via collaterals. CT perfusion Core 62 Penumbra 132  Repeat head CT - Confluent Right hemisphere cytotoxic edema has developed in both the right MCA and Right PCA vascular territories. No malignant hemorrhagic transformation. Mild mass effect on the right lateral ventricle with no midline shift. 6/29 - Repeat CT Head - Confluent Right MCA And PCA territory infarcts. No extension or malignant hemorrhagic transformation since yesterday. But subsequent intracranial mass effect has mildly progressed including 2 mm of leftward midline shift now. MRI Large acute right MCA territory infarct with 4 mm of leftward midline shift. No acute hemorrhage. MRA Head- Persistent occlusion of the right M1 MCA.  2D Echo ejection fraction 60 to 65%.  Left atrial size normal.  No right-to-left shunt LDL 61 HgbA1c 6.3 VTE prophylaxis - heparin  IV Eliquis  (apixaban ) daily prior to admission, now on heparin  IV  Therapy recommendations:  Pending Disposition:  Pending   Cerebral cytotoxic edema with brain compression HTS at 45ml/hr -> 158ml/hr 6/28 250cc bolus 6/29 250cc bolus  Na 138 - 151 Na q 6 per protocol   Procedural intubation  Hypoxemic respiratory failure Hx of malignant pleural effusion PCCM consulted SBT/extubation per CCM CXR: Large left and small right pleural  effusions.  7/1: Thoracentesis with 1400cc    Hypertension Stable Blood Pressure Goal: BP less than 180/105   Hyperlipidemia Home meds:  Crestor  20mg , resumed in hospital LDL 61, goal < 70 Continue statin at discharge  Dysphagia Patient has post-stroke dysphagia, SLP consulted    Diet   Diet NPO time specified   Advance diet as tolerated  Peritoneal carcinomatosis undergoing chemo Follows with Dr. Katragadda at the [facility] Current protocol: niraparib  200 mg daily.  Gyn Onc-  Dr. Viktoria s/p omenectomy small bowel resection, s/p colostomy with colostomy prolapse   Hx left gastrocnemius/calf vein distal DVT Eliquis  5mg  BID No evidence of DVT  Fever  Tmax 101.3 WBC 3.6 RN reports thick secretions Urinalysis negative Sputum culture growing mixed flora, cefepime started by CCM Chest x-ray shows bilateral pleural effusions and venous congestion  Hospital day # 5  Patient seen and examined by NP/APP with MD. MD to update note as needed.   Jorene Last, DNP, FNP-BC [facility] Pager: [phone] I have personally obtained history,examined this patient, reviewed notes, independently viewed imaging studies, participated in medical decision making and plan of care.ROS completed by me personally and pertinent positives fully documented  I have made any additions or clarifications directly to the above note. Agree with note above.  Patient remained critically ill initiated and intubated for respiratory failure.  She had thoracocentesis done yesterday but continues to have significant secretions and likely has bronchitis.  Await arrival of family and discussion with palliative care about goals of care.  Discussed with Dr. Volanda critical care medicine. This patient is critically ill and at significant risk of neurological worsening, death and care requires constant monitoring of vital signs, hemodynamics,respiratory and cardiac monitoring, extensive  review of multiple databases, frequent neurological assessment, discussion with family, other specialists and medical decision making of high complexity.I have made any additions or clarifications directly to the above note.This critical care time does not reflect procedure time, or teaching time or supervisory time of PA/NP/Med Resident etc but could involve care discussion time.  I spent 30 minutes of neurocritical care time  in the care of  this patient.     Eather Popp, MD Medical Director [facility] St[facility] Pager: [phone] 07/31/2023 12:47 PM   To contact Stroke Continuity provider, please refer to [email or web address]. After hours, contact General Neurology

## 2023-07-31 NOTE — Progress Notes (Signed)
 SLP Cancellation Note  Patient Details Name: LORIANA SAMAD MRN: [id number] DOB: 03-11-1945   Cancelled treatment:        SLP attempted to visit patient for cognitive linguistic evaluation, however patient not medically ready. Patient with possible plan for extubation today per Palliative care note. SLP to attempt to return as patient is able to participate.    Halleigh Comes M.A., CCC-SLP 07/31/2023, 8:16 AM

## 2023-08-01 ENCOUNTER — Other Ambulatory Visit: Payer: Self-pay

## 2023-08-01 DIAGNOSIS — Z515 Encounter for palliative care: Secondary | ICD-10-CM | POA: Diagnosis not present

## 2023-08-01 DIAGNOSIS — I639 Cerebral infarction, unspecified: Secondary | ICD-10-CM | POA: Diagnosis not present

## 2023-08-01 DIAGNOSIS — I63431 Cerebral infarction due to embolism of right posterior cerebral artery: Secondary | ICD-10-CM | POA: Diagnosis not present

## 2023-08-01 DIAGNOSIS — J90 Pleural effusion, not elsewhere classified: Secondary | ICD-10-CM

## 2023-08-01 DIAGNOSIS — Z7189 Other specified counseling: Secondary | ICD-10-CM | POA: Diagnosis not present

## 2023-08-01 DIAGNOSIS — I63411 Cerebral infarction due to embolism of right middle cerebral artery: Secondary | ICD-10-CM | POA: Diagnosis not present

## 2023-08-01 DIAGNOSIS — C762 Malignant neoplasm of abdomen: Secondary | ICD-10-CM

## 2023-08-01 DIAGNOSIS — I69391 Dysphagia following cerebral infarction: Secondary | ICD-10-CM | POA: Diagnosis not present

## 2023-08-01 DIAGNOSIS — C786 Secondary malignant neoplasm of retroperitoneum and peritoneum: Secondary | ICD-10-CM | POA: Diagnosis not present

## 2023-08-01 DIAGNOSIS — J9601 Acute respiratory failure with hypoxia: Secondary | ICD-10-CM | POA: Diagnosis not present

## 2023-08-01 LAB — CULTURE, RESPIRATORY W GRAM STAIN: Culture: NORMAL

## 2023-08-01 LAB — GLUCOSE, CAPILLARY
Glucose-Capillary: 150 mg/dL — ABNORMAL HIGH (ref 70–99)
Glucose-Capillary: 157 mg/dL — ABNORMAL HIGH (ref 70–99)
Glucose-Capillary: 158 mg/dL — ABNORMAL HIGH (ref 70–99)
Glucose-Capillary: 188 mg/dL — ABNORMAL HIGH (ref 70–99)
Glucose-Capillary: 189 mg/dL — ABNORMAL HIGH (ref 70–99)
Glucose-Capillary: 215 mg/dL — ABNORMAL HIGH (ref 70–99)

## 2023-08-01 LAB — BASIC METABOLIC PANEL WITH GFR
Anion gap: 11 (ref 5–15)
BUN: 28 mg/dL — ABNORMAL HIGH (ref 8–23)
CO2: 26 mmol/L (ref 22–32)
Calcium: 9.4 mg/dL (ref 8.9–10.3)
Chloride: 108 mmol/L (ref 98–111)
Creatinine, Ser: 0.54 mg/dL (ref 0.44–1.00)
GFR, Estimated: >60 mL/min (ref >60)
Glucose, Bld: 194 mg/dL — ABNORMAL HIGH (ref 70–99)
Potassium: 3.5 mmol/L (ref 3.5–5.1)
Sodium: 145 mmol/L (ref 135–145)

## 2023-08-01 LAB — MAGNESIUM: Magnesium: 2.3 mg/dL (ref 1.7–2.4)

## 2023-08-01 LAB — CBC
HCT: 29.9 % — ABNORMAL LOW (ref 36.0–46.0)
Hemoglobin: 9.4 g/dL — ABNORMAL LOW (ref 12.0–15.0)
MCH: 31.5 pg (ref 26.0–34.0)
MCHC: 31.4 g/dL (ref 30.0–36.0)
MCV: 100.3 fL — ABNORMAL HIGH (ref 80.0–100.0)
Platelets: 238 10*3/uL (ref 150–400)
RBC: 2.98 MIL/uL — ABNORMAL LOW (ref 3.87–5.11)
RDW: 15.9 % — ABNORMAL HIGH (ref 11.5–15.5)
WBC: 3.8 10*3/uL — ABNORMAL LOW (ref 4.0–10.5)
nRBC: 0 % (ref 0.0–0.2)

## 2023-08-01 LAB — HEPARIN LEVEL (UNFRACTIONATED): Heparin Unfractionated: 0.28 [IU]/mL — ABNORMAL LOW (ref 0.30–0.70)

## 2023-08-01 LAB — APTT: aPTT: 70 s — ABNORMAL HIGH (ref 24–36)

## 2023-08-01 MED ORDER — INSULIN ASPART 100 UNIT/ML IJ SOLN
0.0000 [IU] | INTRAMUSCULAR | Status: DC
  Administered 2023-08-01: 3 [IU] via SUBCUTANEOUS

## 2023-08-01 MED ORDER — DEXMEDETOMIDINE HCL IN NACL 400 MCG/100ML IV SOLN
0.0000 ug/kg/h | INTRAVENOUS | Status: DC
  Administered 2023-08-01: 0.4 ug/kg/h via INTRAVENOUS
  Filled 2023-08-01: qty 100

## 2023-08-01 MED ORDER — ALBUMIN HUMAN 5 % IV SOLN
12.5000 g | Freq: Once | INTRAVENOUS | Status: AC
  Administered 2023-08-01: 12.5 g via INTRAVENOUS
  Filled 2023-08-01: qty 250

## 2023-08-01 MED ORDER — POTASSIUM CHLORIDE 20 MEQ PO PACK
40.0000 meq | PACK | Freq: Once | ORAL | Status: AC
  Administered 2023-08-01: 40 meq
  Filled 2023-08-01: qty 2

## 2023-08-01 MED ORDER — INSULIN ASPART 100 UNIT/ML IJ SOLN
0.0000 [IU] | INTRAMUSCULAR | Status: DC
  Administered 2023-08-01 – 2023-08-02 (×4): 3 [IU] via SUBCUTANEOUS
  Administered 2023-08-02 (×2): 2 [IU] via SUBCUTANEOUS
  Administered 2023-08-02 – 2023-08-03 (×5): 3 [IU] via SUBCUTANEOUS
  Administered 2023-08-03: 2 [IU] via SUBCUTANEOUS
  Administered 2023-08-03 – 2023-08-04 (×3): 3 [IU] via SUBCUTANEOUS
  Administered 2023-08-04: 2 [IU] via SUBCUTANEOUS
  Administered 2023-08-04 – 2023-08-05 (×5): 3 [IU] via SUBCUTANEOUS

## 2023-08-01 NOTE — Progress Notes (Signed)
 PT Cancellation Note  Patient Details Name: Rachel Carlson MRN: [id number] DOB: 05/18/1945   Cancelled Treatment:    Reason Eval/Treat Not Completed: PT screened, no needs identified, will sign off;Medical issues which prohibited therapy - per palliative notes Considering DNR status and potential extubation to comfort care. PT to sign off at this time, please reconsult if medically appropriate.   Torris House S, PT DPT Acute Rehabilitation Services Secure Chat Preferred  Office [phone]    Rachel Carlson 08/01/2023, 8:32 AM

## 2023-08-01 NOTE — Progress Notes (Signed)
 HPI: 78 y.o. 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 M1 occlusion with unsuccessful occlusion. Hospitalization complicated by low grade fever with increased secretions. Also with plans to tap malignant pleural effusion.    Subjective: Medical records reviewed including progress notes, labs, imaging. Patient assessed at the bedside.  She is intubated, mildly tachypneic and unresponsive.  Per husband, brother, daughter, and son are present visiting.  Discussed with RN and MD.  Created space and opportunity for patient and family's thoughts and feelings on patient's current illness.  Practiced reflective listening and provided emotional support as patient's family shared their love of her through storytelling.  They are all the same page that she would not find her current state to be acceptable quality of life, though they also want to leave the room for improvement (at least enough to have some level of communication with her).  It would be reassuring for them to be able to receive yes/no responses from the patient and to know she understands the decisions being made on her behalf.  They understand the difficulty of the situation with patient's large stroke and that even in the best case scenario, she is likely to die from her cancer regardless of what her stroke recovery looks like and whether other complications occur.  They would like more time to monitor for this improvement in communication without being asked repeatedly about a decision.  Emotions are further complicated by the fact the  patient's daughter and brother will be leaving to go to [place]  and [place]  respectively tomorrow.  They plan to come back to this area.  We discussed outpatient palliative CODE STATUS conversations and that family did not feel like this was approached in the best way.  They are in agreement with DNR at this time.  Family still taking things day by day and reflecting on next steps and decisions, including whether to reintubate if necessary.  We further discussed patient's culture results, ongoing treatment plan, lack of improvement or change to neurological exam today.  Questions and concerns addressed. PMT will continue to support holistically.   Exam: Sedated on vent. Breathing mildly tachypneic on SBT.  Normal heart rate. Warm to touch. Abd soft. + ostomy.   Assessment: Goals of care conversation PCA infarcts with cytotoxic edema status post unsuccessful thrombectomy M1 occlusion Acute hypoxic respiratory failure Urinary cancer with peritoneal carcinomatosis Malignant pleural effusion status post thoracentesis  Plan: - CODE STATUS changed to DNR/interventions desired - Continue full scope treatment for now, allow more time for outcomes - Hope is for patient to improve enough to communicate with family about decisions.  Family is realistic about poor long-term outcomes - Psychosocial and emotional support provided - PMT to continue to follow and support   Visit consisted of counseling and education dealing with the complex and emotionally intense issues of symptom management and palliative care in the setting of serious and potentially life-threatening illness. Greater than 50% of this time was spent counseling and coordinating care related to the above assessment and plan.  Personally spent 90 minutes in patient care including extensive chart review (labs, imaging, progress/consult notes, vital signs),  medically appropraite exam, discussed with treatment team, education to patient,  family, and staff, documenting clinical information, medication review and management, coordination of care, and available advanced directive documents.       Finlay Mills, PA-C Palliative Medicine Team Team phone # [phone]  Thank you for allowing the Palliative Medicine Team to assist in the care of this patient. Please utilize secure chat with additional questions, if there is no response within 30 minutes please call the above phone number.  Palliative Medicine Team providers are available by phone from 7am to 7pm daily and can be reached through the team cell phone.  Should this patient require assistance outside of these hours, please call the patient's attending physician.  Portions of this note are a verbal dictation therefore any spelling and/or grammatical errors are due to the Dragon Medical One system interpretation.

## 2023-08-01 NOTE — Progress Notes (Signed)
 Attempted to transition from propofol  to precedex , however notified HR into 60s and SBP down into 70.  Will stop precedex  and continue back with propofol  w/ prn fentanyl .        Lyle Pesa, MSN, AG-ACNP-BC [facility] 08/01/2023, 2:16 PM  See Amion for pager If no response to pager , please call [phone] until 7pm After 7:00 pm call Elink  [phone]

## 2023-08-01 NOTE — Progress Notes (Signed)
 NAME:  Rachel Carlson, MRN:  [id number], DOB:  July 13, 1945, LOS: 6 ADMISSION DATE:  07/26/2023, CONSULTATION DATE: 07/26/2023 REFERRING MD: Dr. 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 LKW 10:00. Was found sometime after than on the toilet, had falled couldn't get up and had L sided weakness, R gaze, L facial droop.  Imaging in ED w M1 occlusion. In ED was lethargic but able to state that she took eliquis  this morning.   She went for thrombectomy w NIR, unsuccessful revasc.  Remains intubated after the case as CRNA saw possible posturing and there were concerns regarding plausibility of airway protection, PCCM consulted.    Pertinent  Medical History   Past Medical History:  Diagnosis Date   Blood transfusion without reported diagnosis    Chronic right ear pain    normal MRI   Diverticulosis    DVT of leg (deep venous thrombosis) (HCC)    Esophageal stricture    Family history of breast cancer    Family history of colon cancer    Family history of melanoma    Family history of prostate cancer    Fibroids 1998   uterine, history of (left ovaries)   GERD (gastroesophageal reflux disease)    History of hiatal hernia    History of uterine prolapse    Hx of adenomatous polyp of colon 10/19/2014   Hyperlipidemia    Hypertension    borderline no meds diet and exercise   Nausea and vomiting 05/19/2023   Neuromuscular disorder (HCC)    neuropathy from chemo   Obesity    Ovarian cancer (HCC)    Peritoneal carcinomatosis (HCC)    PVC (premature ventricular contraction)    first dx by holter in 1980's, echo (6/10) EF 55-60%, normal diastolic fxn, normal size RV and fxn, mild MR, PASP   Squamous cell  carcinoma of arm    ovarian   Tubal pregnancy    Ulcerative proctosigmoiditis (HCC)    Urge urinary incontinence 06/03/2007   Qualifier: Diagnosis of  By: Cecilie CMA, NANNIE Mitzie Iline Lionel Events: Including procedures, antibiotic start and stop dates in addition to other pertinent events   M1 occlusion, unsuccessful thrombectomy 6/28-on ventilator, weaning well 6/28 MRI-large acute right MCA territory infarct with 4 mm leftward shift, occlusion of right M1 MCA 7/1 increased secretions, UA neg, tmax 100.5, PMT consult, s/p left thoracentesis 1400 out 7/2 copious secretions, increased WOB on full support, tmax 100.9  Interim History / Subjective:  No events overnight   Objective    Blood pressure 109/66, pulse 92, temperature 98.4 F (36.9 C), temperature source Axillary, resp. rate (!) 25, height 5' 2 (1.575 m), weight 76.5 kg, last menstrual period 05/29/1996, SpO2 96%.    Vent Mode: PRVC FiO2 (%):  [40 %] 40 % Set Rate:  [18 bmp] 18 bmp Vt Set:  [400 mL] 400 mL PEEP:  [5 cmH20] 5 cmH20   Intake/Output Summary (Last 24 hours) at 08/01/2023 0849 Last data filed at 08/01/2023 0700 Gross per 24 hour  Intake 2027.25 ml  Output 1700 ml  Net 327.25 ml   Filed Weights   07/26/23 1243 07/27/23 0532 07/30/23 0600  Weight: 74 kg 73.5 kg  76.5 kg   Examination:  S/p fentanyl  50 for WOB, propofol   General:  critically ill older female lying in bed in NAD HEENT: MM pink/moist, ETT/ OGT, pupils 2/r Neuro: unresponsive to noxious stimuli CV: rr, NSR, no murmur PULM:  better but some intermittent increased WOB on full MV, rales> coarse after suctioning, improved secretion burden> pale yellow GI: soft, bs+, left ostomy with prolapsed stoma with brown liquid stool output Extremities: warm/dry, no  edema  Skin: no rashes  Tmax 100.4  Labs> Na 145, K 3.5, Cl 108, sCr 0.57, BUN 28, Mag 2.3, WBC  5.6> 3.8, H/H 9.4/ 29, glucose 194  UOP 1.7L/ 24hrs Net even 200 ml  stool /24hrs  Day 3x cefepime Trach asp> normal flora  Resolved problem list   Assessment and Plan   M1 occlusion and PCA territory infarcts with cytotoxic edema with unsuccessful thrombectomy  - CTH 7/2 stable - per Stroke team - off HTS 7/1, trend Na, allow gradual trend - serial neuro exams - maintain neuro protective measures - ongoing GOC   Hypoxemic respiratory failure in setting of able PNA +/- atelectasis  - cont full MV support, 4-8cc/kg IBW with goal Pplat <30 and DP<15  - has not weaned 7/2 or 7/3 due to WOB with secretion burden - trach asp with normal flora> color and amount of secretions improving - day 3 cefepime - VAP prevention protocol/ PPI - PAD protocol for sedation> try precedex , stop propofol / prn fentanyl  w/bowel regimen - prn CXR - goal SpO2 >92% - ongoing GOC - hold further diureses   HTN HLD - EF 60-65% - goal SBP < 180 - statin    Sepsis 2/2 suspected PNA Leukopenia - cont cefepime day 3x - follow cultures - trend WBC/ fever curve   Uterine cancer with peritoneal carcinomatosis - Supportive care - Holding home niraparib    Malignant pleural effusion - s/p thoracentesis 7/1, 1400 ml off> GS neg, follow culture (ngtd) and cytology, exudative by Lights criteria   Left gastrocnemius/calf vien DVT on Eliquis  - LE dopplers neg for DVT 6/27 - holding eliquis , heparin  per pharmacy    GOC - overall very poor prognosis.  Ongoing discussions with family, appreciate PMT assistance.  Family desires more time to make decisions.     Best Practice (right click and Reselect all SmartList Selections daily)   Diet/type: tubefeeds DVT prophylaxis systemic heparin  Pressure ulcer(s): N/A GI prophylaxis: PPI Lines: N/A Foley:  N/A Code Status:  full code Last date of multidisciplinary goals of care discussion [spoke with son and spouse at bedside]  Brother updated at bedside 7/3.   Labs   CBC: Recent Labs  Lab 07/26/23 1134  07/26/23 1140 07/28/23 0612 07/29/23 0912 07/29/23 1127 07/30/23 0534 07/31/23 0422 08/01/23 0509  WBC 5.0  --  3.8* 3.6*  --  3.2* 5.6 3.8*  NEUTROABS 4.6  --   --   --   --   --   --   --   HGB 9.8*   < > 9.0* 8.5* 7.1* 9.3* 10.1* 9.4*  HCT 29.1*   < > 28.2* 26.8* 21.0* 29.6* 31.9* 29.9*  MCV 95.4  --  100.4* 100.4*  --  103.1* 101.3* 100.3*  PLT 164  --  192 192  --  122* 238 238   < > = values in this interval not displayed.    Basic Metabolic Panel: Recent Labs  Lab 07/28/23 0534 07/28/23 0612 07/29/23 0534 07/29/23 1127 07/29/23 1134 07/29/23 2325 07/30/23 0534  07/30/23 1125 07/31/23 0422 08/01/23 0509  NA 152*   < > 149* 154*   < > 156* 156* 158* 150* 145  K 3.0*  --  2.8* 3.5  --   --  3.3*  --  4.0 3.5  CL 125*  --  118*  --   --   --  125*  --  117* 108  CO2 21*  --  21*  --   --   --  25  --  24 26  GLUCOSE 159*  --  133*  --   --   --  140*  --  176* 194*  BUN 7*  --  9  --   --   --  11  --  19 28*  CREATININE 0.45  --  0.41*  --   --   --  0.47  --  0.57 0.54  CALCIUM  8.8*  --  8.9  --   --   --  9.1  --  9.7 9.4  MG  --   --   --   --   --   --  2.1  --   --  2.3  PHOS  --   --   --   --   --   --  3.6  --   --   --    < > = values in this interval not displayed.   GFR: Estimated Creatinine Clearance: 58.2 mL/min (by C-G formula based on SCr of 0.54 mg/dL). Recent Labs  Lab 07/29/23 0912 07/30/23 0534 07/31/23 0422 08/01/23 0509  WBC 3.6* 3.2* 5.6 3.8*    Liver Function Tests: Recent Labs  Lab 07/26/23 1134 07/30/23 1257  AST 16  --   ALT 11  --   ALKPHOS 58  --   BILITOT 0.6  --   PROT 4.4* 5.4*  ALBUMIN  3.1*  --    No results for input(s): LIPASE, AMYLASE in the last 168 hours. No results for input(s): AMMONIA in the last 168 hours.  ABG    Component Value Date/Time   PHART 7.408 07/29/2023 1127   PCO2ART 34.7 07/29/2023 1127   PO2ART 87 07/29/2023 1127   HCO3 21.9 07/29/2023 1127   TCO2 23 07/29/2023 1127    ACIDBASEDEF 2.0 07/29/2023 1127   O2SAT 97 07/29/2023 1127     Coagulation Profile: Recent Labs  Lab 07/26/23 1134  INR 1.2    Cardiac Enzymes: No results for input(s): CKTOTAL, CKMB, CKMBINDEX, TROPONINI in the last 168 hours.  HbA1C: Hgb A1c MFr Bld  Date/Time Value Ref Range Status  07/23/2023 12:30 PM 6.3 4.6 - 6.5 % Final    Comment:    Glycemic Control Guidelines for People with Diabetes:Non Diabetic:  <6%Goal of Therapy: <7%Additional Action Suggested:  >8%   08/13/2022 08:43 AM 6.3 4.6 - 6.5 % Final    Comment:    Glycemic Control Guidelines for People with Diabetes:Non Diabetic:  <6%Goal of Therapy: <7%Additional Action Suggested:  >8%     CBG: Recent Labs  Lab 07/31/23 1609 07/31/23 2013 07/31/23 2349 08/01/23 0356 08/01/23 0743  GLUCAP 156* 148* 188* 189* 150*       Critical care time 40 minutes     Lyle Pesa, MSN, AG-ACNP-BC [facility] 08/01/2023, 8:49 AM  See Amion for pager If no response to pager , please call [phone] until 7pm After 7:00 pm call Elink  [phone]

## 2023-08-01 NOTE — Progress Notes (Addendum)
 This chaplain is present for F/U spiritual care and coordination of the family request for a priest visit. The Pt. brother-Mark is at the bedside. The chaplain listened reflectively to Mark's support of a priest visit for the Pt. and inability to schedule a time for a visit for the other family members.  The chaplain will revisit the Pt. and family after lunch today.  **1250 This chaplain is present for F/U spiritual care in the setting of updating the family on Father Jack's visit for anointing of the sick. The Pt. daughter, son, husband, and brother are at the bedside.   The chaplain confirmed the family's presence and Father Jack's visit around 5:30pm today.  The family accepted the chaplain's invitation for story telling and getting to know the Pt.   Chaplain Leeroy Hummer [phone]

## 2023-08-01 NOTE — Plan of Care (Signed)
  Problem: Health Behavior/Discharge Planning: Goal: Goals will be collaboratively established with patient/family Outcome: Progressing   Problem: Clinical Measurements: Goal: Ability to maintain clinical measurements within normal limits will improve Outcome: Progressing Goal: Cardiovascular complication will be avoided Outcome: Progressing   Problem: Nutrition: Goal: Adequate nutrition will be maintained Outcome: Progressing   Problem: Elimination: Goal: Will not experience complications related to urinary retention Outcome: Progressing   Problem: Nutrition: Goal: Risk of aspiration will decrease Outcome: Not Progressing

## 2023-08-01 NOTE — Progress Notes (Signed)
 SLP Cancellation Note  Patient Details Name: Rachel Carlson MRN: [id number] DOB: [birth year]   Cancelled assessment: Pt remains intubated.  SLP will sign off at this time.  Zeki Bedrosian L. Vona, MA CCC/SLP Clinical Specialist - Acute Care SLP Acute Rehabilitation Services Office number [phone]          Vona Palma Laurice 08/01/2023, 7:55 AM

## 2023-08-01 NOTE — Progress Notes (Signed)
 PHARMACY - ANTICOAGULATION CONSULT NOTE  Pharmacy Consult:  Heparin  Indication:  History of DVT, new CVA  Allergies  Allergen Reactions   Aspartame Other (See Comments)    Intolerance to artifical sweeteners, pt states caused burning of bladder   Levaquin  [Levofloxacin ] Nausea Only   Pantoprazole  Sodium Diarrhea    Abdominal pain   Ranitidine Diarrhea and Other (See Comments)    Abdominal pain, also   Omeprazole  Diarrhea and Other (See Comments)    Abdominal pain, also   Sulfasalazine  Rash and Other (See Comments)    Fever, chills, headache, muscle pain also    Patient Measurements: Height: 5' 2 (157.5 cm) Weight: 76.5 kg (168 lb 10.4 oz) IBW/kg (Calculated) : 50.1 HEPARIN  DW (KG): 66  Vital Signs: Temp: 98.4 F (36.9 C) (07/03 0800) Temp Source: Axillary (07/03 0800) BP: 109/66 (07/03 0730) Pulse Rate: 92 (07/03 0730)  Labs: Recent Labs    07/29/23 1127 07/30/23 0510 07/30/23 0534 07/30/23 2230 07/31/23 0422 07/31/23 0924 08/01/23 0509 08/01/23 0816  HGB  --   --  9.3*  --  10.1*  --  9.4*  --   HCT  --   --  29.6*  --  31.9*  --  29.9*  --   PLT  --   --  122*  --  238  --  238  --   APTT   < >  --  87* 48*  --  68*  --  70*  HEPARINUNFRC  --  0.68  --   --   --  0.34  --  0.28*  CREATININE  --   --  0.47  --  0.57  --  0.54  --    < > = values in this interval not displayed.    Estimated Creatinine Clearance: 58.2 mL/min (by C-G formula based on SCr of 0.54 mg/dL).   Assessment: 29 YOF with history of DVT on Eliquis  PTA, last dose on 6/26 at 2100.  Patient presented as a code stroke and thrombectomy was unsuccessful.  Pharmacy consulted to dose IV heparin  per stroke protocol.  No bleeding reported.  aPTT 70 sec (therapeutic) on infusion at 900 units/hr. Heparin  level down to slightly subtherapeutic at 0.28. No bleeding noted. **will utilize heparin  level only for monitoring going forward**  Goal of Therapy:  Heparin  level 0.3-0.5 units/ml Monitor  platelets by anticoagulation protocol: Yes   Plan:  Increase heparin  slightly to 950 units/hr Will f/u daily heparin  level  Vito Ralph, PharmD, BCPS Please see amion for complete clinical pharmacist phone list 08/01/2023 9:32 AM

## 2023-08-01 NOTE — Progress Notes (Addendum)
 STROKE TEAM PROGRESS NOTE   SIGNIFICANT HOSPITAL EVENTS 6/27- Admitted to ICU, intubated   INTERIM HISTORY/SUBJECTIVE Brother at the bedside, rest of family is on the way. No change in neuro exam, secretions improved today.  Family hydrodilated care again yesterday but still need more time to make decisions about long-term goals of care.  They want to continue full support for now.  Patient remains sedated intubated on propofol  has been changed to Precedex  today OBJECTIVE  CBC    Component Value Date/Time   WBC 3.8 (L) 08/01/2023 0509   RBC 2.98 (L) 08/01/2023 0509   HGB 9.4 (L) 08/01/2023 0509   HGB 11.0 (L) 04/26/2023 1043   HCT 29.9 (L) 08/01/2023 0509   PLT 238 08/01/2023 0509   PLT 199 04/26/2023 1043   MCV 100.3 (H) 08/01/2023 0509   MCH 31.5 08/01/2023 0509   MCHC 31.4 08/01/2023 0509   RDW 15.9 (H) 08/01/2023 0509   LYMPHSABS 0.2 (L) 07/26/2023 1134   MONOABS 0.1 07/26/2023 1134   EOSABS 0.0 07/26/2023 1134   BASOSABS 0.0 07/26/2023 1134    BMET    Component Value Date/Time   NA 145 08/01/2023 0509   K 3.5 08/01/2023 0509   CL 108 08/01/2023 0509   CO2 26 08/01/2023 0509   GLUCOSE 194 (H) 08/01/2023 0509   BUN 28 (H) 08/01/2023 0509   CREATININE 0.54 08/01/2023 0509   CREATININE 0.64 04/26/2023 1043   CALCIUM  9.4 08/01/2023 0509   GFRNONAA >60 08/01/2023 0509   GFRNONAA >60 04/26/2023 1043    IMAGING past 24 hours No results found.    Vitals:   08/01/23 0630 08/01/23 0700 08/01/23 0730 08/01/23 0800  BP: 117/67 122/78 109/66   Pulse: 100 (!) 103 92   Resp: (!) 26 (!) 30 (!) 25   Temp:    98.4 F (36.9 C)  TempSrc:    Axillary  SpO2: 97% 95% 96%   Weight:      Height:         PHYSICAL EXAM General: Intubated well-nourished, well-developed elderly Caucasian lady in no acute distress CV: Regular rate and rhythm on monitor Respiratory: Respirations synchronous with ventilator   NEURO:  Mental Status: Patient is sedated with Precedex  and  intubated.  Pupils are equal round and sluggishly reactive to light, cough reflex is intact, patient will follow commands with right lower extremity and localizes with right upper extremity.  She does not move left upper extremity today and withdraws bilateral lower extremities to noxious  ASSESSMENT/PLAN  Rachel Carlson is a 78 y.o. 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 pfo Code Stroke CT head- Acute right MCA infarct.  ASPECTS of 7. No intracranial hemorrhage. CTA head & neck - Occlusion of the proximal/mid M1 segment right MCA with intraluminal filling defect concerning for thrombus. Few diminutive caliber M2 and M3 branches noted likely filled via collaterals. CT perfusion Core 62 Penumbra 132  Repeat head CT - Confluent Right hemisphere cytotoxic edema has developed in both the right MCA and Right PCA vascular territories.  No malignant hemorrhagic transformation. Mild mass effect on the right lateral ventricle with no midline shift. 6/29 - Repeat CT Head - Confluent Right MCA And PCA territory infarcts. No extension or malignant hemorrhagic transformation since yesterday. But subsequent intracranial mass effect has mildly progressed including 2 mm of leftward midline shift now. MRI Large acute right MCA territory infarct with 4 mm of leftward midline shift. No acute hemorrhage. MRA Head- Persistent occlusion of the right M1 MCA.  2D Echo ejection fraction 60 to 65%.  Left atrial size normal.  No right-to-left  shunt LDL 61 HgbA1c 6.3 VTE prophylaxis - heparin  IV Eliquis  (apixaban ) daily prior to admission, now on heparin  IV  Therapy recommendations:  Pending Disposition:  Pending   Cerebral cytotoxic edema with brain compression HTS at 14ml/hr -> 143ml/hr 6/28 250cc bolus 6/29 250cc bolus  Na 138 - 151 Na q 6 per protocol   Procedural intubation  Hypoxemic respiratory failure Hx of malignant pleural effusion PCCM consulted SBT/extubation per CCM CXR: Large left and small right pleural effusions.  7/1: Thoracentesis with 1400cc    Hypertension Stable Blood Pressure Goal: BP less than 180/105   Hyperlipidemia Home meds:  Crestor  20mg , resumed in hospital LDL 61, goal < 70 Continue statin at discharge  Dysphagia Patient has post-stroke dysphagia, SLP consulted    Diet   Diet NPO time specified   Advance diet as tolerated  Peritoneal carcinomatosis undergoing chemo Follows with Dr. Katragadda at the [facility] Current protocol: niraparib  200 mg daily.  Gyn Onc-  Dr. Viktoria s/p omenectomy small bowel resection, s/p colostomy with colostomy prolapse   Hx left gastrocnemius/calf vein distal DVT Eliquis  5mg  BID No evidence of DVT  Fever  Tmax 101.3 WBC 3.6 RN reports thick secretions Urinalysis negative Sputum culture growing mixed flora, cefepime started by CCM Chest x-ray shows bilateral pleural effusions and venous congestion  Hospital day # 6  Patient seen and examined by NP/APP with MD. MD to update note as needed.   Rachel Last, Rachel Carlson, Rachel Carlson [facility] Pager: [phone]  I have personally obtained history,examined this patient, reviewed notes, independently viewed imaging studies, participated in medical decision making and plan of care.ROS completed by me personally and pertinent positives fully documented  I have made any additions or clarifications directly to the above note. Agree with note above.  Patient remains sedated  and intubated but can follow some intermittent commands on the right side.  Her secretions are declined today.  Family still struggling with making decisions about goals of care.  Appreciate palliative care team help.  Discussed with critical care team and patient's brother and daughter at the bedside and answered questions This patient is critically ill and at significant risk of neurological worsening, death and care requires constant monitoring of vital signs, hemodynamics,respiratory and cardiac monitoring, extensive review of multiple databases, frequent neurological assessment, discussion with family, other specialists and medical decision making of high complexity.I have made any additions or clarifications directly to the above note.This critical care time does not reflect procedure time, or teaching time or supervisory time of PA/NP/Med Resident etc but could involve care discussion time.  I spent 30 minutes of neurocritical care time  in the care of  this patient.     Eather Popp, MD Medical Director [facility] Stroke Center Pager: [phone] 08/01/2023 3:29 PM  To contact Stroke Continuity provider, please refer to [email or web address]. After hours, contact General Neurology

## 2023-08-01 NOTE — Progress Notes (Signed)
 OT Cancellation Note  Patient Details Name: Rachel Carlson MRN: [id number] DOB: [birth year]   Cancelled Treatment:    Reason Eval/Treat Not Completed: Medical issues which prohibited therapy  - per palliative notes Considering DNR status and potential extubation to comfort care. OT to sign off at this time, please reconsult if medically appropriate.   Etta NOVAK, OT Acute Rehabilitation Services Office [phone] Secure Chat Preferred    Etta GORMAN Hope 08/01/2023, 8:38 AM

## 2023-08-01 NOTE — Progress Notes (Signed)
 [facility] ADULT ICU REPLACEMENT PROTOCOL   The patient does apply for the [facility] Adult ICU Electrolyte Replacment Protocol based on the criteria listed below:   1.Exclusion criteria: TCTS, ECMO, Dialysis, and Myasthenia Gravis patients 2. Is GFR >/= 30 ml/min? Yes.    Patient's GFR today is >60 3. Is SCr </= 2? Yes.   Patient's SCr is 0.54 mg/dL 4. Did SCr increase >/= 0.5 in 24 hours? No. 5.Pt's weight >40kg  Yes.   6. Abnormal electrolyte(s):   K 3.5  7. Electrolytes replaced per protocol 8.  Call MD STAT for K+ </= 2.5, Phos </= 1, or Mag </= 1 Physician:  A. Paliwal  Rachel Carlson R Rachel Carlson 08/01/2023 5:47 AM

## 2023-08-02 DIAGNOSIS — I69391 Dysphagia following cerebral infarction: Secondary | ICD-10-CM | POA: Diagnosis not present

## 2023-08-02 DIAGNOSIS — Z515 Encounter for palliative care: Secondary | ICD-10-CM | POA: Diagnosis not present

## 2023-08-02 DIAGNOSIS — I639 Cerebral infarction, unspecified: Secondary | ICD-10-CM | POA: Diagnosis not present

## 2023-08-02 DIAGNOSIS — C786 Secondary malignant neoplasm of retroperitoneum and peritoneum: Secondary | ICD-10-CM | POA: Diagnosis not present

## 2023-08-02 DIAGNOSIS — J9601 Acute respiratory failure with hypoxia: Secondary | ICD-10-CM | POA: Diagnosis not present

## 2023-08-02 DIAGNOSIS — I10 Essential (primary) hypertension: Secondary | ICD-10-CM

## 2023-08-02 DIAGNOSIS — I63431 Cerebral infarction due to embolism of right posterior cerebral artery: Secondary | ICD-10-CM | POA: Diagnosis not present

## 2023-08-02 DIAGNOSIS — I63411 Cerebral infarction due to embolism of right middle cerebral artery: Secondary | ICD-10-CM | POA: Diagnosis not present

## 2023-08-02 DIAGNOSIS — E785 Hyperlipidemia, unspecified: Secondary | ICD-10-CM | POA: Diagnosis not present

## 2023-08-02 LAB — CBC
HCT: 28.4 % — ABNORMAL LOW (ref 36.0–46.0)
Hemoglobin: 8.9 g/dL — ABNORMAL LOW (ref 12.0–15.0)
MCH: 31.4 pg (ref 26.0–34.0)
MCHC: 31.3 g/dL (ref 30.0–36.0)
MCV: 100.4 fL — ABNORMAL HIGH (ref 80.0–100.0)
Platelets: 226 K/uL (ref 150–400)
RBC: 2.83 MIL/uL — ABNORMAL LOW (ref 3.87–5.11)
RDW: 15.6 % — ABNORMAL HIGH (ref 11.5–15.5)
WBC: 4.2 K/uL (ref 4.0–10.5)
nRBC: 0 % (ref 0.0–0.2)

## 2023-08-02 LAB — BODY FLUID CULTURE W GRAM STAIN
Culture: NO GROWTH
Gram Stain: NONE SEEN

## 2023-08-02 LAB — BASIC METABOLIC PANEL WITH GFR
Anion gap: 6 (ref 5–15)
BUN: 23 mg/dL (ref 8–23)
CO2: 28 mmol/L (ref 22–32)
Calcium: 9.4 mg/dL (ref 8.9–10.3)
Chloride: 113 mmol/L — ABNORMAL HIGH (ref 98–111)
Creatinine, Ser: 0.4 mg/dL — ABNORMAL LOW (ref 0.44–1.00)
GFR, Estimated: >60 mL/min (ref >60)
Glucose, Bld: 187 mg/dL — ABNORMAL HIGH (ref 70–99)
Potassium: 3.8 mmol/L (ref 3.5–5.1)
Sodium: 147 mmol/L — ABNORMAL HIGH (ref 135–145)

## 2023-08-02 LAB — PHOSPHORUS: Phosphorus: 2.9 mg/dL (ref 2.5–4.6)

## 2023-08-02 LAB — MAGNESIUM: Magnesium: 2.3 mg/dL (ref 1.7–2.4)

## 2023-08-02 LAB — GLUCOSE, CAPILLARY
Glucose-Capillary: 139 mg/dL — ABNORMAL HIGH (ref 70–99)
Glucose-Capillary: 148 mg/dL — ABNORMAL HIGH (ref 70–99)
Glucose-Capillary: 151 mg/dL — ABNORMAL HIGH (ref 70–99)
Glucose-Capillary: 165 mg/dL — ABNORMAL HIGH (ref 70–99)
Glucose-Capillary: 165 mg/dL — ABNORMAL HIGH (ref 70–99)
Glucose-Capillary: 171 mg/dL — ABNORMAL HIGH (ref 70–99)
Glucose-Capillary: 176 mg/dL — ABNORMAL HIGH (ref 70–99)

## 2023-08-02 LAB — TRIGLYCERIDES: Triglycerides: 236 mg/dL — ABNORMAL HIGH (ref <150)

## 2023-08-02 LAB — HEPARIN LEVEL (UNFRACTIONATED)
Heparin Unfractionated: 0.18 [IU]/mL — ABNORMAL LOW (ref 0.30–0.70)
Heparin Unfractionated: 0.16 [IU]/mL — ABNORMAL LOW (ref 0.30–0.70)

## 2023-08-02 NOTE — Progress Notes (Signed)
 STROKE TEAM PROGRESS NOTE   SIGNIFICANT HOSPITAL EVENTS 6/27- Admitted to ICU, intubated   INTERIM HISTORY/SUBJECTIVE Brother and daughter are at the bedside, patient was unable to tolerate Precedex  due to bradycardia and is back on propofol  now.  She remains sedated intubated and on ventilatory support and poorly responsive.  She does have purposeful movements on the right but does move the left leg okay more for left arm. OBJECTIVE  CBC    Component Value Date/Time   WBC 4.2 08/02/2023 0435   RBC 2.83 (L) 08/02/2023 0435   HGB 8.9 (L) 08/02/2023 0435   HGB 11.0 (L) 04/26/2023 1043   HCT 28.4 (L) 08/02/2023 0435   PLT 226 08/02/2023 0435   PLT 199 04/26/2023 1043   MCV 100.4 (H) 08/02/2023 0435   MCH 31.4 08/02/2023 0435   MCHC 31.3 08/02/2023 0435   RDW 15.6 (H) 08/02/2023 0435   LYMPHSABS 0.2 (L) 07/26/2023 1134   MONOABS 0.1 07/26/2023 1134   EOSABS 0.0 07/26/2023 1134   BASOSABS 0.0 07/26/2023 1134    BMET    Component Value Date/Time   NA 147 (H) 08/02/2023 0435   K 3.8 08/02/2023 0435   CL 113 (H) 08/02/2023 0435   CO2 28 08/02/2023 0435   GLUCOSE 187 (H) 08/02/2023 0435   BUN 23 08/02/2023 0435   CREATININE 0.40 (L) 08/02/2023 0435   CREATININE 0.64 04/26/2023 1043   CALCIUM  9.4 08/02/2023 0435   GFRNONAA >60 08/02/2023 0435   GFRNONAA >60 04/26/2023 1043    IMAGING past 24 hours No results found.    Vitals:   08/02/23 0800 08/02/23 0812 08/02/23 0900 08/02/23 1000  BP: 103/61  115/74 113/70  Pulse: (!) 101  (!) 111 (!) 106  Resp: (!) 21  20 (!) 23  Temp: 98.8 F (37.1 C)     TempSrc: Axillary     SpO2:  97%    Weight:      Height:         PHYSICAL EXAM General: Intubated well-nourished, well-developed elderly Caucasian lady in no acute distress CV: Regular rate and rhythm on monitor Respiratory: Respirations synchronous with ventilator   NEURO:  Mental Status: Patient is sedated with propofol  and intubated.  Pupils are equal round and  sluggishly reactive to light, cough reflex is intact, patient will follow commands with right lower extremity and localizes with right upper extremity.  She does not move left upper extremity today and withdraws bilateral lower extremities to noxious stimuli right more than left  ASSESSMENT/PLAN  [name] is a 78 y.o. 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 pfo Code Stroke CT head- Acute right MCA infarct.  ASPECTS of 7. No intracranial hemorrhage. CTA head & neck - Occlusion of the proximal/mid M1 segment right MCA with intraluminal filling defect concerning for thrombus. Few diminutive caliber M2 and M3 branches noted likely filled via collaterals. CT perfusion Core 62 Penumbra 132  Repeat head CT - Confluent Right hemisphere cytotoxic edema has developed in both the right MCA and Right PCA vascular  territories. No malignant hemorrhagic transformation. Mild mass effect on the right lateral ventricle with no midline shift. 6/29 - Repeat CT Head - Confluent Right MCA And PCA territory infarcts. No extension or malignant hemorrhagic transformation since yesterday. But subsequent intracranial mass effect has mildly progressed including 2 mm of leftward midline shift now. MRI Large acute right MCA territory infarct with 4 mm of leftward midline shift. No acute hemorrhage. MRA Head- Persistent occlusion of the right M1 MCA.  2D Echo ejection fraction 60 to 65%.  Left atrial size normal.  No right-to-left shunt LDL  61 HgbA1c 6.3 VTE prophylaxis - heparin  IV Eliquis  (apixaban ) daily prior to admission, now on heparin  IV  Therapy recommendations:  Pending Disposition:  Pending   Cerebral cytotoxic edema with brain compression HTS at 25ml/hr -> 148ml/hr 6/28 250cc bolus 6/29 250cc bolus  Na 138 - 151 Na q 6 per protocol   Procedural intubation  Hypoxemic respiratory failure Hx of malignant pleural effusion PCCM consulted SBT/extubation per CCM CXR: Large left and small right pleural effusions.  7/1: Thoracentesis with 1400cc    Hypertension Stable Blood Pressure Goal: BP less than 180/105   Hyperlipidemia Home meds:  Crestor  20mg , resumed in hospital LDL 61, goal < 70 Continue statin at discharge  Dysphagia Patient has post-stroke dysphagia, SLP consulted    Diet   Diet NPO time specified   Advance diet as tolerated  Peritoneal carcinomatosis undergoing chemo Follows with Dr. Katragadda at the [facility] Current protocol: niraparib  200 mg daily.  Gyn Onc-  Dr. Viktoria s/p omenectomy small bowel resection, s/p colostomy with colostomy prolapse   Hx left gastrocnemius/calf vein distal DVT Eliquis  5mg  BID No evidence of DVT  Fever  Tmax 101.3 WBC 3.6 RN reports thick secretions Urinalysis negative Sputum culture growing mixed flora, cefepime  started by CCM Chest x-ray shows bilateral pleural effusions and venous congestion  Hospital day # 7   Patient remains sedated and intubated and today is not following commands.  Her secretions are declined today.  Family still struggling with making decisions about goals of care.  Discussed with critical care team and patient's brother and daughter at the bedside and answered questions   This patient is critically ill and at significant risk of neurological worsening, death and care requires constant monitoring of vital signs, hemodynamics,respiratory and cardiac monitoring, extensive review of multiple databases,  frequent neurological assessment, discussion with family, other specialists and medical decision making of high complexity.I have made any additions or clarifications directly to the above note.This critical care time does not reflect procedure time, or teaching time or supervisory time of PA/NP/Med Resident etc but could involve care discussion time.  I spent 32 minutes of neurocritical care time  in the care of  this patient.        Eather Popp, MD Medical Director [facility] Pager: [phone] 08/02/2023 11:42 AM  To contact Stroke Continuity provider, please refer to [email or web address]. After hours, contact General Neurology

## 2023-08-02 NOTE — Progress Notes (Signed)
 NAME:  Rachel Carlson, MRN:  [id number], DOB:  09-Feb-1945, LOS: 7 ADMISSION DATE:  07/26/2023, CONSULTATION DATE: 07/26/2023 REFERRING MD: Dr. 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 LKW 10:00. Was found sometime after than on the toilet, had falled couldn't get up and had L sided weakness, R gaze, L facial droop.  Imaging in ED w M1 occlusion. In ED was lethargic but able to state that she took eliquis  this morning.   She went for thrombectomy w NIR, unsuccessful revasc.  Remains intubated after the case as CRNA saw possible posturing and there were concerns regarding plausibility of airway protection, PCCM consulted.    Pertinent  Medical History   Past Medical History:  Diagnosis Date   Blood transfusion without reported diagnosis    Chronic right ear pain    normal MRI   Diverticulosis    DVT of leg (deep venous thrombosis) (HCC)    Esophageal stricture    Family history of breast cancer    Family history of colon cancer    Family history of melanoma    Family history of prostate cancer    Fibroids 1998   uterine, history of (left ovaries)   GERD (gastroesophageal reflux disease)    History of hiatal hernia    History of uterine prolapse    Hx of adenomatous polyp of colon 10/19/2014   Hyperlipidemia    Hypertension    borderline no meds diet and exercise   Nausea and vomiting 05/19/2023   Neuromuscular disorder (HCC)    neuropathy from chemo   Obesity    Ovarian cancer (HCC)    Peritoneal carcinomatosis (HCC)    PVC (premature ventricular contraction)    first dx by holter in 1980's, echo (6/10) EF 55-60%, normal diastolic fxn, normal size RV and fxn, mild MR, PASP   Squamous cell  carcinoma of arm    ovarian   Tubal pregnancy    Ulcerative proctosigmoiditis (HCC)    Urge urinary incontinence 06/03/2007   Qualifier: Diagnosis of  By: Cecilie CMA, NANNIE Mitzie Iline Lionel Events: Including procedures, antibiotic start and stop dates in addition to other pertinent events   M1 occlusion, unsuccessful thrombectomy 6/28-on ventilator, weaning well 6/28 MRI-large acute right MCA territory infarct with 4 mm leftward shift, occlusion of right M1 MCA 7/1 increased secretions, UA neg, tmax 100.5, PMT consult, s/p left thoracentesis 1400 out 7/2 copious secretions, increased WOB on full support, tmax 100.9  Interim History / Subjective:  No events overnight, moves right arm somewhat purposefully, does not follow commands.  On low-dose sedative is nearly comatose  Objective    Blood pressure 115/74, pulse (!) 111, temperature 98.8 F (37.1 C), temperature source Axillary, resp. rate 20, height 5' 2 (1.575 m), weight 71 kg, last menstrual period 05/29/1996, SpO2 97%.    Vent Mode: CPAP;PSV FiO2 (%):  [40 %] 40 % Set Rate:  [18 bmp] 18 bmp Vt Set:  [400 mL] 400 mL PEEP:  [5 cmH20] 5 cmH20 Pressure Support:  [8 cmH20-10 cmH20] 10 cmH20   Intake/Output Summary (Last 24 hours) at 08/02/2023 1020 Last data filed at 08/02/2023 0600 Gross per 24 hour  Intake 1619.66 ml  Output 1925  ml  Net -305.34 ml   Filed Weights   07/27/23 0532 07/30/23 0600 08/02/23 0530  Weight: 73.5 kg 76.5 kg 71 kg   Examination:  General:  critically ill older female lying in bed in NAD HEENT: MM pink/moist, ETT/ OGT, pupils 2/r Neuro: unresponsive to noxious stimuli CV: rr, NSR, no murmur PULM: Coarse ventilated breath sounds pressure support with good volumes GI: soft, bs+, left ostomy with prolapsed stoma with brown liquid stool output Extremities: warm/dry, no  edema  Skin: no rashes  Labs and images reviewed  Resolved problem list   Assessment and Plan   M1  occlusion and PCA territory infarcts with cytotoxic edema with unsuccessful thrombectomy  - CTH 7/2 stable - per Stroke team - off HTS 7/1, trend Na, allow gradual trend - serial neuro exams - maintain neuro protective measures - ongoing GOC, she has a large territory stroke with clinical decline on neurologic exam over time, her performance status will be poor she will be total care, I doubt she would be a candidate for ongoing chemotherapy, will discuss all this with husband and other family members as able  Acute hypoxemic respiratory failure and ventilator dependence in setting of CVA PNA +/- atelectasis  - PRVC, PSV as tolerated, VAP bundle, stress ulcer prophylaxis - trach asp with normal flora> color and amount of secretions improving - Plan 5-day cefepime  - Failed trial of Precedex  with hypotension and bradycardia 7/3  HTN HLD - EF 60-65% - goal SBP < 180 - statin   Sepsis 2/2 suspected PNA Leukopenia -Plan 5 days cefepime  - follow cultures - trend WBC/ fever curve   Uterine cancer with peritoneal carcinomatosis - Supportive care - Holding home niraparib   Malignant pleural effusion - s/p thoracentesis 7/1, 1400 ml off> GS neg, follow culture (ngtd) and cytology, exudative by Lights criteria  Left gastrocnemius/calf vien DVT on Eliquis  - LE dopplers neg for DVT 6/27 - holding eliquis , heparin  per pharmacy   GOC - overall very poor prognosis.  Ongoing discussions with family, appreciate PMT assistance.    Best Practice (right click and Reselect all SmartList Selections daily)   Diet/type: tubefeeds DVT prophylaxis systemic heparin  Pressure ulcer(s): N/A GI prophylaxis: PPI Lines: N/A Foley:  N/A Code Status:  full code Last date of multidisciplinary goals of care discussion [update family as able, multiple family members, ideally will try to communicate via single family member, hopefully husband]   CRITICAL CARE Performed by: Donnice JONELLE Beals   Total critical care time: 33 minutes  Critical care time was exclusive of separately billable procedures and treating other patients.  Critical care was necessary to treat or prevent imminent or life-threatening deterioration.  Critical care was time spent personally by me on the following activities: development of treatment plan with patient and/or surrogate as well as nursing, discussions with consultants, evaluation of patient's response to treatment, examination of patient, obtaining history from patient or surrogate, ordering and performing treatments and interventions, ordering and review of laboratory studies, ordering and review of radiographic studies, pulse oximetry and re-evaluation of patient's condition.  Donnice JONELLE Beals, MD [facility] 08/02/2023, 10:20 AM  See Amion If no response, please call [phone] until 7pm After 7:00 pm call Elink  [phone]

## 2023-08-02 NOTE — Progress Notes (Signed)
 PHARMACY - ANTICOAGULATION CONSULT NOTE  Pharmacy Consult:  Heparin  Indication:  History of DVT, new CVA  Allergies  Allergen Reactions   Aspartame Other (See Comments)    Intolerance to artifical sweeteners, pt states caused burning of bladder   Levaquin  [Levofloxacin ] Nausea Only   Pantoprazole  Sodium Diarrhea    Abdominal pain   Ranitidine Diarrhea and Other (See Comments)    Abdominal pain, also   Omeprazole  Diarrhea and Other (See Comments)    Abdominal pain, also   Sulfasalazine  Rash and Other (See Comments)    Fever, chills, headache, muscle pain also    Patient Measurements: Height: 5' 2 (157.5 cm) Weight: 71 kg (156 lb 8.4 oz) IBW/kg (Calculated) : 50.1 HEPARIN  DW (KG): 66  Vital Signs: Temp: 100.2 F (37.9 C) (07/04 1541) Temp Source: Axillary (07/04 1541) BP: 108/73 (07/04 1600) Pulse Rate: 104 (07/04 1600)  Labs: Recent Labs    07/30/23 2230 07/31/23 0422 07/31/23 0422 07/31/23 0924 08/01/23 0509 08/01/23 0816 08/02/23 0435 08/02/23 1629  HGB  --  10.1*   < >  --  9.4*  --  8.9*  --   HCT  --  31.9*  --   --  29.9*  --  28.4*  --   PLT  --  238  --   --  238  --  226  --   APTT 48*  --   --  68*  --  70*  --   --   HEPARINUNFRC  --   --    < > 0.34  --  0.28* 0.18* 0.16*  CREATININE  --  0.57  --   --  0.54  --  0.40*  --    < > = values in this interval not displayed.    Estimated Creatinine Clearance: 56.1 mL/min (A) (by C-G formula based on SCr of 0.4 mg/dL (L)).   Assessment: 40 YOF with history of DVT on Eliquis  PTA, last dose on 6/26 at 2100.  Patient presented as a code stroke and thrombectomy was unsuccessful on 7/1.  Pharmacy consulted to dose IV heparin  per stroke protocol.    Heparin  level down further at 0.16 (subtherapeutic) on infusion at 1050 units/hr. No bleeding noted. Noted Hgb down to 8.9 this AM.  Goal of Therapy:  Heparin  level 0.3-0.5 units/ml Monitor platelets by anticoagulation protocol: Yes   Plan:  Increase  heparin  infusion to 1200 units/hr Will f/u 8 hr heparin  level  Harlene Boga, PharmD, BCPS, BCCCP Please refer to [facility] for [facility] Pharmacy numbers 08/02/2023 7:34 PM

## 2023-08-02 NOTE — Progress Notes (Signed)
 PHARMACY - ANTICOAGULATION CONSULT NOTE  Pharmacy Consult:  Heparin  Indication:  History of DVT, new CVA  Allergies  Allergen Reactions   Aspartame Other (See Comments)    Intolerance to artifical sweeteners, pt states caused burning of bladder   Levaquin  [Levofloxacin ] Nausea Only   Pantoprazole  Sodium Diarrhea    Abdominal pain   Ranitidine Diarrhea and Other (See Comments)    Abdominal pain, also   Omeprazole  Diarrhea and Other (See Comments)    Abdominal pain, also   Sulfasalazine  Rash and Other (See Comments)    Fever, chills, headache, muscle pain also    Patient Measurements: Height: 5' 2 (157.5 cm) Weight: 71 kg (156 lb 8.4 oz) IBW/kg (Calculated) : 50.1 HEPARIN  DW (KG): 66  Vital Signs: Temp: 98.7 F (37.1 C) (07/04 0400) Temp Source: Axillary (07/04 0400) BP: 111/72 (07/04 0700) Pulse Rate: 110 (07/04 0700)  Labs: Recent Labs    07/30/23 2230 07/31/23 0422 07/31/23 0422 07/31/23 0924 08/01/23 0509 08/01/23 0816 08/02/23 0435  HGB  --  10.1*   < >  --  9.4*  --  8.9*  HCT  --  31.9*  --   --  29.9*  --  28.4*  PLT  --  238  --   --  238  --  226  APTT 48*  --   --  68*  --  70*  --   HEPARINUNFRC  --   --   --  0.34  --  0.28* 0.18*  CREATININE  --  0.57  --   --  0.54  --  0.40*   < > = values in this interval not displayed.    Estimated Creatinine Clearance: 56.1 mL/min (A) (by C-G formula based on SCr of 0.4 mg/dL (L)).   Assessment: 20 YOF with history of DVT on Eliquis  PTA, last dose on 6/26 at 2100.  Patient presented as a code stroke and thrombectomy was unsuccessful.  Pharmacy consulted to dose IV heparin  per stroke protocol.    Heparin  level down further this morning to 0.18 (subtherapeutic) on infusion at 950 units/hr. No bleeding noted. Noted Hgb down to 8.9.  Goal of Therapy:  Heparin  level 0.3-0.5 units/ml Monitor platelets by anticoagulation protocol: Yes   Plan:  Increase heparin  infusion to 1050 units/hr Will f/u 8 hr heparin   level  Vito Ralph, PharmD, BCPS Please see amion for complete clinical pharmacist phone list 08/02/2023 7:28 AM

## 2023-08-02 NOTE — Progress Notes (Signed)
  HPI: 78 y.o. 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 M1 occlusion with unsuccessful occlusion. Hospitalization complicated by low grade fever with increased secretions. Also with plans to tap malignant pleural effusion.    Subjective: Medical records reviewed including progress notes, labs, imaging. Patient assessed at the bedside. Intubated and sedated. No family present during my visit.  Attempted to call patient's husband for ongoing palliative support but was unable to reach. Left voicemail with PMT contact information.   PMT will continue to support holistically.    Exam: Sedated on vent. Breathing mildly tachypneic on PS.  Normal heart rate. Warm to touch. Abd soft. + ostomy.   Assessment: Goals of care conversation PCA infarcts with cytotoxic edema status post unsuccessful thrombectomy M1 occlusion Acute hypoxic respiratory failure Urinary cancer with peritoneal carcinomatosis Malignant pleural effusion status post thoracentesis  Plan: - Continue DNR - Continue full scope treatment for now, allow more time for outcomes. Husband is hopeful for their children to come to terms with situation and make unified decision, understanding poor long-term prognosis  - PMT to continue to follow and support   Visit consisted of counseling and education dealing with the complex and emotionally intense issues of symptom management and palliative care in the setting of serious and potentially life-threatening illness. Greater than 50% of this time was spent counseling and  coordinating care related to the above assessment and plan.  Personally spent 25 minutes in patient care including extensive chart review (labs, imaging, progress/consult notes, vital signs), medically appropraite exam, discussed with treatment team, education to patient, family, and staff, documenting clinical information, medication review and management, coordination of care, and available advanced directive documents.       Pallie Swigert, PA-C Palliative Medicine Team Team phone # [phone]  Thank you for allowing the Palliative Medicine Team to assist in the care of this patient. Please utilize secure chat with additional questions, if there is no response within 30 minutes please call the above phone number.  Palliative Medicine Team providers are available by phone from 7am to 7pm daily and can be reached through the team cell phone.  Should this patient require assistance outside of these hours, please call the patient's attending physician.  Portions of this note are a verbal dictation therefore any spelling and/or grammatical errors are due to the Dragon [employer] One system interpretation.

## 2023-08-02 NOTE — Plan of Care (Signed)
  Problem: Education: Goal: Knowledge of disease or condition will improve Outcome: Progressing   Problem: Coping: Goal: Will verbalize positive feelings about self Outcome: Progressing Goal: Will identify appropriate support needs Outcome: Progressing   Problem: Health Behavior/Discharge Planning: Goal: Ability to manage health-related needs will improve Outcome: Progressing Goal: Goals will be collaboratively established with patient/family Outcome: Progressing   Problem: Self-Care: Goal: Ability to participate in self-care as condition permits will improve Outcome: Progressing Goal: Verbalization of feelings and concerns over difficulty with self-care will improve Outcome: Progressing Goal: Ability to communicate needs accurately will improve Outcome: Progressing   Problem: Nutrition: Goal: Risk of aspiration will decrease Outcome: Progressing Goal: Dietary intake will improve Outcome: Progressing

## 2023-08-03 DIAGNOSIS — E785 Hyperlipidemia, unspecified: Secondary | ICD-10-CM | POA: Diagnosis not present

## 2023-08-03 DIAGNOSIS — I69391 Dysphagia following cerebral infarction: Secondary | ICD-10-CM | POA: Diagnosis not present

## 2023-08-03 DIAGNOSIS — I63541 Cerebral infarction due to unspecified occlusion or stenosis of right cerebellar artery: Secondary | ICD-10-CM

## 2023-08-03 DIAGNOSIS — Z515 Encounter for palliative care: Secondary | ICD-10-CM | POA: Diagnosis not present

## 2023-08-03 DIAGNOSIS — I10 Essential (primary) hypertension: Secondary | ICD-10-CM | POA: Diagnosis not present

## 2023-08-03 DIAGNOSIS — I63411 Cerebral infarction due to embolism of right middle cerebral artery: Secondary | ICD-10-CM | POA: Diagnosis not present

## 2023-08-03 DIAGNOSIS — J9601 Acute respiratory failure with hypoxia: Secondary | ICD-10-CM | POA: Diagnosis not present

## 2023-08-03 DIAGNOSIS — I639 Cerebral infarction, unspecified: Secondary | ICD-10-CM | POA: Diagnosis not present

## 2023-08-03 DIAGNOSIS — I63511 Cerebral infarction due to unspecified occlusion or stenosis of right middle cerebral artery: Secondary | ICD-10-CM | POA: Diagnosis not present

## 2023-08-03 DIAGNOSIS — C786 Secondary malignant neoplasm of retroperitoneum and peritoneum: Secondary | ICD-10-CM | POA: Diagnosis not present

## 2023-08-03 LAB — CBC
HCT: 28.4 % — ABNORMAL LOW (ref 36.0–46.0)
Hemoglobin: 8.9 g/dL — ABNORMAL LOW (ref 12.0–15.0)
MCH: 31.2 pg (ref 26.0–34.0)
MCHC: 31.3 g/dL (ref 30.0–36.0)
MCV: 99.6 fL (ref 80.0–100.0)
Platelets: 267 K/uL (ref 150–400)
RBC: 2.85 MIL/uL — ABNORMAL LOW (ref 3.87–5.11)
RDW: 15.9 % — ABNORMAL HIGH (ref 11.5–15.5)
WBC: 6.2 K/uL (ref 4.0–10.5)
nRBC: 0.5 % — ABNORMAL HIGH (ref 0.0–0.2)

## 2023-08-03 LAB — BASIC METABOLIC PANEL WITH GFR
Anion gap: 8 (ref 5–15)
BUN: 25 mg/dL — ABNORMAL HIGH (ref 8–23)
CO2: 27 mmol/L (ref 22–32)
Calcium: 9.4 mg/dL (ref 8.9–10.3)
Chloride: 111 mmol/L (ref 98–111)
Creatinine, Ser: 0.52 mg/dL (ref 0.44–1.00)
GFR, Estimated: >60 mL/min (ref >60)
Glucose, Bld: 156 mg/dL — ABNORMAL HIGH (ref 70–99)
Potassium: 4 mmol/L (ref 3.5–5.1)
Sodium: 146 mmol/L — ABNORMAL HIGH (ref 135–145)

## 2023-08-03 LAB — HEPARIN LEVEL (UNFRACTIONATED)
Heparin Unfractionated: 0.3 [IU]/mL (ref 0.30–0.70)
Heparin Unfractionated: 0.31 [IU]/mL (ref 0.30–0.70)

## 2023-08-03 LAB — GLUCOSE, CAPILLARY
Glucose-Capillary: 144 mg/dL — ABNORMAL HIGH (ref 70–99)
Glucose-Capillary: 159 mg/dL — ABNORMAL HIGH (ref 70–99)
Glucose-Capillary: 166 mg/dL — ABNORMAL HIGH (ref 70–99)
Glucose-Capillary: 170 mg/dL — ABNORMAL HIGH (ref 70–99)
Glucose-Capillary: 175 mg/dL — ABNORMAL HIGH (ref 70–99)
Glucose-Capillary: 185 mg/dL — ABNORMAL HIGH (ref 70–99)

## 2023-08-03 MED ORDER — FREE WATER
200.0000 mL | Status: DC
  Administered 2023-08-03 – 2023-08-05 (×13): 200 mL

## 2023-08-03 NOTE — Plan of Care (Signed)
 Poor prognosis but stable.

## 2023-08-03 NOTE — Progress Notes (Addendum)
 STROKE TEAM PROGRESS NOTE   SIGNIFICANT HOSPITAL EVENTS 6/27- Admitted to ICU, intubated   INTERIM HISTORY/SUBJECTIVE Patient remains hemodynamically stable with Tmax of 100.7.  She is seen in her room with no family at the bedside.  She is seen while off sedation today.  She finishes her course of cefepime  today. OBJECTIVE  CBC    Component Value Date/Time   WBC 6.2 08/03/2023 0521   RBC 2.85 (L) 08/03/2023 0521   HGB 8.9 (L) 08/03/2023 0521   HGB 11.0 (L) 04/26/2023 1043   HCT 28.4 (L) 08/03/2023 0521   PLT 267 08/03/2023 0521   PLT 199 04/26/2023 1043   MCV 99.6 08/03/2023 0521   MCH 31.2 08/03/2023 0521   MCHC 31.3 08/03/2023 0521   RDW 15.9 (H) 08/03/2023 0521   LYMPHSABS 0.2 (L) 07/26/2023 1134   MONOABS 0.1 07/26/2023 1134   EOSABS 0.0 07/26/2023 1134   BASOSABS 0.0 07/26/2023 1134    BMET    Component Value Date/Time   NA 146 (H) 08/03/2023 0521   K 4.0 08/03/2023 0521   CL 111 08/03/2023 0521   CO2 27 08/03/2023 0521   GLUCOSE 156 (H) 08/03/2023 0521   BUN 25 (H) 08/03/2023 0521   CREATININE 0.52 08/03/2023 0521   CREATININE 0.64 04/26/2023 1043   CALCIUM  9.4 08/03/2023 0521   GFRNONAA >60 08/03/2023 0521   GFRNONAA >60 04/26/2023 1043    IMAGING past 24 hours No results found.    Vitals:   08/03/23 0800 08/03/23 0900 08/03/23 1000 08/03/23 1100  BP: 101/71 116/79 118/83 137/82  Pulse: 97 (!) 104 (!) 110 (!) 109  Resp: (!) 28 (!) 30 (!) 31 (!) 24  Temp: 99.5 F (37.5 C)     TempSrc: Axillary     SpO2: 96% 98% 94% 98%  Weight:      Height:         PHYSICAL EXAM General: Intubated well-nourished, well-developed elderly Caucasian lady in no acute distress Cardiovascular -irregular rate and rhythm with frequent PVC and PACs Respiratory: Respirations synchronous with ventilator   NEURO:  Mental Status: Patient is intubated but off sedation.  Pupils are equal round and sluggishly reactive to light, corneal reflex present on the right but not  on the left, cough reflex is intact, blinks to threat on the right but not on the left, patient will follow commands with right lower extremity and localizes with right upper extremity.  She will withdraw left upper extremity and bilateral lower extremities to noxious  ASSESSMENT/PLAN  [name] is a 78 y.o. 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 Rachel Carlson  NIH on Admission 20  Stroke: large right MCA and tiny R cerebellar infarcts with R M1 occlusion s/p unsuccessful IR with TICI 0, etiology concerning for hypercoaguable state in the setting of advanced malignancy  Code Stroke CT head- Acute right MCA infarct.  ASPECTS of 7. No intracranial hemorrhage. CTA head & neck - Occlusion of the proximal/mid M1 segment right MCA with intraluminal filling defect concerning for thrombus. Few diminutive caliber M2 and M3 branches noted likely filled via collaterals. CT perfusion Core 62 Penumbra 132  S/p IR but unsuccessful with TICI 0 MRI Large acute right MCA territory infarct with 4 mm of  leftward midline shift. No acute hemorrhage. MRA Head- Persistent occlusion of the right M1 MCA. Repeat head CT - Confluent Right hemisphere cytotoxic edema has developed in both the right MCA and Right PCA vascular territories.  6/29 - Repeat CT Head - Confluent Right MCA And PCA territory infarcts. mass effect has mildly progressed including 2 mm of leftward midline shift. 7/1 CT repeat MLS 2-3 mm 2D Echo EF 60 to 65%.  Left atrial size normal.  No right-to-left shunt LDL 61 HgbA1c 6.3 VTE prophylaxis - heparin  IV Eliquis  (apixaban ) daily prior to admission, now on heparin  IV  Therapy recommendations:   Pending Disposition:  Pending, palliative care on board, family is struggling for decision  Cerebral cytotoxic edema  HTS at 26ml/hr -> 168ml/hr 6/28 250cc bolus 6/29 250cc bolus  Hypertonic saline discontinued  7/1 CT repeat MLS 2-3 mm  Procedural intubation  Hypoxemic respiratory failure Hx of malignant pleural effusion PCCM consulted SBT/extubation per CCM CXR: Large left and small right pleural effusions.  7/1: Thoracentesis with 1400cc   Off propofol   Hypertension Stable Blood Pressure Goal: BP less than 180/105  Long term BP goal normotensive  Hyperlipidemia Home meds:  Crestor  20mg , resumed in hospital LDL 61, goal < 70 Continue statin at discharge  Dysphagia Patient has post-stroke dysphagia SLP consulted Now NPO On TF @ 55  Peritoneal carcinomatosis undergoing chemo Follows with Dr. Katragadda at the [facility] Current protocol: niraparib  200 mg daily.  Gyn Onc-  Dr. Viktoria s/p omenectomy small bowel resection, s/p colostomy with colostomy prolapse   Hx left gastrocnemius/calf vein distal DVT Eliquis  5mg  BID PTA No evidence of DVT this admission On heparin  IV  Fever  Tmax 101.3--100.4--100.7 WBC 3.8--4.2--6.2 RN reports thick secretions Urinalysis negative Sputum culture growing mixed flora, cefepime  started by CCM, finishing course today Chest x-ray shows bilateral pleural effusions and venous congestion  Hospital day # 8  Patient seen by NP with MD, MD to edit note as needed. Cortney E Everitt Clint Kill , MSN, AGACNP-BC [facility] See Amion for schedule and pager information 08/03/2023 12:17 PM  ATTENDING NOTE: I reviewed above note and agree with the assessment and plan. Pt was seen and examined.   No family at the bedside.  Patient still intubated off sedation now, eyes closed and barely open with repetitive stimulation, following commands. With forced eye opening, eyes in right gaze position, slightly blinking to visual  threat on the right but not on the left, not tracking, PERRL. Corneal reflex absent on the left but positive on the right, gag and cough . Breathing over the vent.  Facial symmetry not able to test due to ET tube.  Tongue protrusion not cooperative. RUE against gravity with localization to pain, right lower extremity mild withdraw to pain.  Left upper and lower extremity slight withdraw to pain stimulation. L positive babinski. Sensation, coordination and gait not tested.  For detailed assessment and plan, please refer to above as I have made changes wherever appropriate.   Ary Cummins, MD PhD Stroke Neurology 08/03/2023 9:12 PM  This patient is critically ill due to R MCA infarct with R M1 occlusion, DVT on eliquis , respiratory failure, fever and at significant risk of neurological worsening, death form large R MCA stroke, hemorrhagic conversion, PE, cerebral edema, sepsis. This patient's care requires constant monitoring of vital signs, hemodynamics, respiratory and cardiac monitoring, review of multiple databases, neurological assessment, discussion with family, other specialists and medical decision making of high complexity. I spent  40 minutes of neurocritical care time in the care of this patient   To contact Stroke Continuity provider, please refer to [email or web address]. After hours, contact General Neurology

## 2023-08-03 NOTE — Progress Notes (Signed)
  HPI: 78 y.o. 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 M1 occlusion with unsuccessful occlusion. Hospitalization complicated by low grade fever with increased secretions. Also with plans to tap malignant pleural effusion.    Subjective: Medical records reviewed including progress notes, labs, imaging. Patient assessed at the bedside. Intubated and sedated. Patient's husband and son were present visiting.   Emotional support provided to patient's family shared their continued hope for little more responsiveness and understanding of poor long-term outcome even if this occurred.  They report that she opened her eyes and looked around late this morning.  She was squeezing son's hand, though he was not able to be sure she was understanding questions being asked or just following commands.  They are not ready for any decisions at this time, acknowledging that we remained in a limbo.  Discussed efforts at weaning from ventilator and resulting increased work of breathing as well as process of trying to wean from propofol .  They shared that they received my voicemail yesterday and already back at the bedside thus did not return my call.  Questions and concerns addressed.  PMT will continue to support holistically.    Exam: Sedated on vent.  Tachypneic.  Normal heart rate. Warm to touch. Abd soft. + ostomy.   Assessment: Goals of care conversation PCA infarcts with cytotoxic edema status post unsuccessful thrombectomy M1 occlusion Acute hypoxic respiratory failure Urinary cancer with  peritoneal carcinomatosis Malignant pleural effusion status post thoracentesis  Plan: - Continue DNR - Continue full scope treatment for now, allow more time for outcomes.  Family is struggling with making a decision despite knowledge of poor long-term outcome and knowing patient preference would be to avoid prolonging current quality of life - Psychosocial and emotional support provided - Ongoing goals of care discussions - PMT to continue to follow and support   Visit consisted of counseling and education dealing with the complex and emotionally intense issues of symptom management and palliative care in the setting of serious and potentially life-threatening illness. Greater than 50% of this time was spent counseling and coordinating care related to the above assessment and plan.  Personally spent 25 minutes in patient care including extensive chart review (labs, imaging, progress/consult notes, vital signs), medically appropraite exam, discussed with treatment team, education to patient, family, and staff, documenting clinical information, medication review and management, coordination of care, and available advanced directive documents.       Wayburn Shaler, PA-C Palliative Medicine Team Team phone # [phone]  Thank you for allowing the Palliative Medicine Team to assist in the care of this patient. Please utilize secure chat with additional questions, if there is no response within 30 minutes please call the above phone number.  Palliative Medicine Team providers are available by phone from 7am to 7pm daily and can be reached through the team cell phone.  Should this patient require assistance outside of these hours, please call the patient's attending physician.  Portions of this note are a verbal dictation therefore any spelling and/or grammatical errors are due to the Dragon Medical One system interpretation.

## 2023-08-03 NOTE — Progress Notes (Signed)
 NAME:  Rachel Carlson, MRN:  [id number], DOB:  March 31, 1945, LOS: 8 ADMISSION DATE:  07/26/2023, CONSULTATION DATE: 07/26/2023 REFERRING MD: Dr. 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 LKW 10:00. Was found sometime after than on the toilet, had falled couldn't get up and had L sided weakness, R gaze, L facial droop.  Imaging in ED w M1 occlusion. In ED was lethargic but able to state that she took eliquis  this morning.   She went for thrombectomy w NIR, unsuccessful revasc.  Remains intubated after the case as CRNA saw possible posturing and there were concerns regarding plausibility of airway protection, PCCM consulted.    Pertinent  Medical History   Past Medical History:  Diagnosis Date   Blood transfusion without reported diagnosis    Chronic right ear pain    normal MRI   Diverticulosis    DVT of leg (deep venous thrombosis) (HCC)    Esophageal stricture    Family history of breast cancer    Family history of colon cancer    Family history of melanoma    Family history of prostate cancer    Fibroids 1998   uterine, history of (left ovaries)   GERD (gastroesophageal reflux disease)    History of hiatal hernia    History of uterine prolapse    Hx of adenomatous polyp of colon 10/19/2014   Hyperlipidemia    Hypertension    borderline no meds diet and exercise   Nausea and vomiting 05/19/2023   Neuromuscular disorder (HCC)    neuropathy from chemo   Obesity    Ovarian cancer (HCC)    Peritoneal carcinomatosis (HCC)    PVC (premature ventricular contraction)    first dx by holter in 1980's, echo (6/10) EF 55-60%, normal diastolic fxn, normal size RV and fxn, mild MR, PASP   Squamous cell  carcinoma of arm    ovarian   Tubal pregnancy    Ulcerative proctosigmoiditis (HCC)    Urge urinary incontinence 06/03/2007   Qualifier: Diagnosis of  By: Cecilie CMA, NANNIE Mitzie Iline Lionel Events: Including procedures, antibiotic start and stop dates in addition to other pertinent events   M1 occlusion, unsuccessful thrombectomy 6/28-on ventilator, weaning well 6/28 MRI-large acute right MCA territory infarct with 4 mm leftward shift, occlusion of right M1 MCA 7/1 increased secretions, UA neg, tmax 100.5, PMT consult, s/p left thoracentesis 1400 out 7/2 copious secretions, increased WOB on full support, tmax 100.9  Interim History / Subjective:  No events overnight, moves right arm somewhat purposefully, does not follow commands.    Objective    Blood pressure 118/83, pulse (!) 110, temperature 99.5 F (37.5 C), temperature source Axillary, resp. rate (!) 31, height 5' 2 (1.575 m), weight 71 kg, last menstrual period 05/29/1996, SpO2 94%.    Vent Mode: PSV;CPAP FiO2 (%):  [40 %] 40 % Set Rate:  [18 bmp] 18 bmp Vt Set:  [400 mL] 400 mL PEEP:  [5 cmH20] 5 cmH20 Pressure Support:  [8 cmH20-10 cmH20] 8 cmH20 Plateau Pressure:  [8 cmH20-17 cmH20] 16 cmH20   Intake/Output Summary (Last 24 hours) at 08/03/2023 1124 Last data filed at 08/03/2023 0900 Gross per 24 hour  Intake 2641.31  ml  Output 1225 ml  Net 1416.31 ml   Filed Weights   07/27/23 0532 07/30/23 0600 08/02/23 0530  Weight: 73.5 kg 76.5 kg 71 kg   Examination:  General:  critically ill older female lying in bed in NAD HEENT: MM pink/moist, ETT/ OGT, pupils 2/r Neuro: unresponsive to noxious stimuli CV: rr, NSR, no murmur PULM: Coarse ventilated breath sounds pressure support with good volumes GI: soft, bs+, left ostomy with prolapsed stoma with brown liquid stool output Extremities: warm/dry, no  edema  Skin: no rashes  Labs and images reviewed  Resolved problem list   Assessment and Plan    M1 occlusion and PCA territory infarcts with cytotoxic edema with unsuccessful thrombectomy  - CTH 7/2 stable - per Stroke team - off HTS 7/1, trend Na, allow to gradually decline - serial neuro exams - maintain neuro protective measures - ongoing GOC, she has a large territory stroke with clinical decline on neurologic exam over time, her performance status will be poor she will be total care, I doubt she would be a candidate for ongoing chemotherapy, have discussed with family  Acute hypoxemic respiratory failure and ventilator dependence in setting of CVA PNA +/- atelectasis  - PRVC, PSV as tolerated, VAP bundle, stress ulcer prophylaxis - trach asp with normal flora> color and amount of secretions improving - Plan 5-day cefepime  - Failed trial of Precedex  with hypotension and bradycardia 7/3  HTN HLD - EF 60-65% - goal SBP < 180 - statin   Sepsis 2/2 suspected PNA Leukopenia -Plan 5 days cefepime  - follow cultures - trend WBC/ fever curve   Uterine cancer with peritoneal carcinomatosis - Supportive care - Holding home niraparib   Malignant pleural effusion - s/p thoracentesis 7/1, 1400 ml off> GS neg, follow culture (ngtd) and cytology, exudative by Lights criteria  Left gastrocnemius/calf vien DVT on Eliquis  - LE dopplers neg for DVT 6/27 - holding eliquis , heparin  per pharmacy   GOC - overall very poor prognosis.  Ongoing discussions with family, appreciate PMT assistance.    Best Practice (right click and Reselect all SmartList Selections daily)   Diet/type: tubefeeds DVT prophylaxis systemic heparin  Pressure ulcer(s): N/A GI prophylaxis: PPI Lines: N/A Foley:  N/A Code Status:  full code Last date of multidisciplinary goals of care discussion [updated husband 7/4]   CRITICAL CARE Performed by: Donnice SAUNDERS Imya Mance   Total critical care time: 31 minutes  Critical care time was exclusive of separately billable procedures and treating other  patients.  Critical care was necessary to treat or prevent imminent or life-threatening deterioration.  Critical care was time spent personally by me on the following activities: development of treatment plan with patient and/or surrogate as well as nursing, discussions with consultants, evaluation of patient's response to treatment, examination of patient, obtaining history from patient or surrogate, ordering and performing treatments and interventions, ordering and review of laboratory studies, ordering and review of radiographic studies, pulse oximetry and re-evaluation of patient's condition.  Donnice SAUNDERS Beals, MD [facility] 08/03/2023, 11:24 AM  See Amion If no response, please call [phone] until 7pm After 7:00 pm call Elink  [phone]

## 2023-08-03 NOTE — Progress Notes (Signed)
 PHARMACY - ANTICOAGULATION CONSULT NOTE  Pharmacy Consult for heparin  Indication: h/o DVT in setting of acute CVA  Labs: Recent Labs    07/31/23 0924 08/01/23 0509 08/01/23 0816 08/02/23 0435 08/02/23 1629 08/03/23 0340  HGB  --  9.4*  --  8.9*  --   --   HCT  --  29.9*  --  28.4*  --   --   PLT  --  238  --  226  --   --   APTT 68*  --  70*  --   --   --   HEPARINUNFRC 0.34  --  0.28* 0.18* 0.16* 0.30  CREATININE  --  0.54  --  0.40*  --   --    Assessment/Plan:  78yo female therapeutic on heparin  after rate change. Will continue infusion at current rate of 1200 units/hr and confirm stable with additional level.  Rachel Carlson, PharmD, BCPS 08/03/2023 5:15 AM

## 2023-08-03 NOTE — Progress Notes (Signed)
 PHARMACY - ANTICOAGULATION CONSULT NOTE  Pharmacy Consult:  Heparin  Indication:  History of DVT, new CVA  Allergies  Allergen Reactions   Aspartame Other (See Comments)    Intolerance to artifical sweeteners, pt states caused burning of bladder   Levaquin  [Levofloxacin ] Nausea Only   Pantoprazole  Sodium Diarrhea    Abdominal pain   Ranitidine Diarrhea and Other (See Comments)    Abdominal pain, also   Omeprazole  Diarrhea and Other (See Comments)    Abdominal pain, also   Sulfasalazine  Rash and Other (See Comments)    Fever, chills, headache, muscle pain also    Patient Measurements: Height: 5' 2 (157.5 cm) Weight: 71 kg (156 lb 8.4 oz) IBW/kg (Calculated) : 50.1 HEPARIN  DW (KG): 66  Vital Signs: Temp: 99.5 F (37.5 C) (07/05 0800) Temp Source: Axillary (07/05 0800) BP: 139/78 (07/05 1200) Pulse Rate: 116 (07/05 1200)  Labs: Recent Labs    08/01/23 0509 08/01/23 0816 08/01/23 0816 08/02/23 0435 08/02/23 1629 08/03/23 0340 08/03/23 0521 08/03/23 1158  HGB 9.4*  --   --  8.9*  --   --  8.9*  --   HCT 29.9*  --   --  28.4*  --   --  28.4*  --   PLT 238  --   --  226  --   --  267  --   APTT  --  70*  --   --   --   --   --   --   HEPARINUNFRC  --  0.28*   < > 0.18* 0.16* 0.30  --  0.31  CREATININE 0.54  --   --  0.40*  --   --  0.52  --    < > = values in this interval not displayed.    Estimated Creatinine Clearance: 56.1 mL/min (by C-G formula based on SCr of 0.52 mg/dL).   Assessment: Rachel Carlson with history of DVT on Eliquis  PTA, last dose on 6/26 at 2100.  Patient presented as a code stroke and thrombectomy was unsuccessful on 7/1.  Pharmacy consulted to dose IV heparin  per stroke protocol.    Heparin  level therapeutic (0.31) on infusion at 1200 units/hr. No bleeding noted.  Goal of Therapy:  Heparin  level 0.3-0.5 units/ml Monitor platelets by anticoagulation protocol: Yes   Plan:  Continue heparin  infusion at 1200 units/hr Daily heparin  level and  CBC  Vito Ralph, PharmD, BCPS Please see amion for complete clinical pharmacist phone list 08/03/2023 1:28 PM

## 2023-08-04 DIAGNOSIS — I10 Essential (primary) hypertension: Secondary | ICD-10-CM | POA: Diagnosis not present

## 2023-08-04 DIAGNOSIS — Z7189 Other specified counseling: Secondary | ICD-10-CM | POA: Diagnosis not present

## 2023-08-04 DIAGNOSIS — I639 Cerebral infarction, unspecified: Secondary | ICD-10-CM | POA: Diagnosis not present

## 2023-08-04 DIAGNOSIS — I63411 Cerebral infarction due to embolism of right middle cerebral artery: Secondary | ICD-10-CM | POA: Diagnosis not present

## 2023-08-04 DIAGNOSIS — I63511 Cerebral infarction due to unspecified occlusion or stenosis of right middle cerebral artery: Secondary | ICD-10-CM | POA: Diagnosis not present

## 2023-08-04 DIAGNOSIS — C786 Secondary malignant neoplasm of retroperitoneum and peritoneum: Secondary | ICD-10-CM | POA: Diagnosis not present

## 2023-08-04 DIAGNOSIS — I63541 Cerebral infarction due to unspecified occlusion or stenosis of right cerebellar artery: Secondary | ICD-10-CM | POA: Diagnosis not present

## 2023-08-04 DIAGNOSIS — I69391 Dysphagia following cerebral infarction: Secondary | ICD-10-CM | POA: Diagnosis not present

## 2023-08-04 DIAGNOSIS — J9601 Acute respiratory failure with hypoxia: Secondary | ICD-10-CM | POA: Diagnosis not present

## 2023-08-04 DIAGNOSIS — Z515 Encounter for palliative care: Secondary | ICD-10-CM | POA: Diagnosis not present

## 2023-08-04 DIAGNOSIS — E785 Hyperlipidemia, unspecified: Secondary | ICD-10-CM | POA: Diagnosis not present

## 2023-08-04 LAB — BASIC METABOLIC PANEL WITH GFR
Anion gap: 8 (ref 5–15)
BUN: 25 mg/dL — ABNORMAL HIGH (ref 8–23)
CO2: 27 mmol/L (ref 22–32)
Calcium: 9.4 mg/dL (ref 8.9–10.3)
Chloride: 108 mmol/L (ref 98–111)
Creatinine, Ser: 0.44 mg/dL (ref 0.44–1.00)
GFR, Estimated: >60 mL/min (ref >60)
Glucose, Bld: 219 mg/dL — ABNORMAL HIGH (ref 70–99)
Potassium: 3.8 mmol/L (ref 3.5–5.1)
Sodium: 143 mmol/L (ref 135–145)

## 2023-08-04 LAB — GLUCOSE, CAPILLARY
Glucose-Capillary: 148 mg/dL — ABNORMAL HIGH (ref 70–99)
Glucose-Capillary: 156 mg/dL — ABNORMAL HIGH (ref 70–99)
Glucose-Capillary: 164 mg/dL — ABNORMAL HIGH (ref 70–99)
Glucose-Capillary: 166 mg/dL — ABNORMAL HIGH (ref 70–99)
Glucose-Capillary: 176 mg/dL — ABNORMAL HIGH (ref 70–99)
Glucose-Capillary: 177 mg/dL — ABNORMAL HIGH (ref 70–99)

## 2023-08-04 LAB — CBC
HCT: 28.8 % — ABNORMAL LOW (ref 36.0–46.0)
Hemoglobin: 8.9 g/dL — ABNORMAL LOW (ref 12.0–15.0)
MCH: 30.9 pg (ref 26.0–34.0)
MCHC: 30.9 g/dL (ref 30.0–36.0)
MCV: 100 fL (ref 80.0–100.0)
Platelets: 282 K/uL (ref 150–400)
RBC: 2.88 MIL/uL — ABNORMAL LOW (ref 3.87–5.11)
RDW: 15.8 % — ABNORMAL HIGH (ref 11.5–15.5)
WBC: 7.9 K/uL (ref 4.0–10.5)
nRBC: 0.3 % — ABNORMAL HIGH (ref 0.0–0.2)

## 2023-08-04 LAB — CA 125: Cancer Antigen (CA) 125: 1202 U/mL — ABNORMAL HIGH (ref 0.0–38.1)

## 2023-08-04 LAB — HEPARIN LEVEL (UNFRACTIONATED): Heparin Unfractionated: 0.35 [IU]/mL (ref 0.30–0.70)

## 2023-08-04 MED ORDER — SODIUM CHLORIDE 0.9% FLUSH: 10.0000 mL | INTRAVENOUS | Status: DC | PRN

## 2023-08-04 MED ORDER — SODIUM CHLORIDE 0.9% FLUSH
10.0000 mL | Freq: Two times a day (BID) | INTRAVENOUS | Status: DC
  Administered 2023-08-04 (×2): 10 mL
  Administered 2023-08-05: 20 mL
  Administered 2023-08-05 – 2023-08-08 (×7): 10 mL

## 2023-08-04 NOTE — Progress Notes (Signed)
 NAME:  Rachel Carlson, MRN:  [id number], DOB:  07/27/1945, LOS: 9 ADMISSION DATE:  07/26/2023, CONSULTATION DATE: 07/26/2023 REFERRING MD: Dr. 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 LKW 10:00. Was found sometime after than on the toilet, had falled couldn't get up and had L sided weakness, R gaze, L facial droop.  Imaging in ED w M1 occlusion. In ED was lethargic but able to state that she took eliquis  this morning.   She went for thrombectomy w NIR, unsuccessful revasc.  Remains intubated after the case as CRNA saw possible posturing and there were concerns regarding plausibility of airway protection, PCCM consulted.    Pertinent  Medical History   Past Medical History:  Diagnosis Date   Blood transfusion without reported diagnosis    Chronic right ear pain    normal MRI   Diverticulosis    DVT of leg (deep venous thrombosis) (HCC)    Esophageal stricture    Family history of breast cancer    Family history of colon cancer    Family history of melanoma    Family history of prostate cancer    Fibroids 1998   uterine, history of (left ovaries)   GERD (gastroesophageal reflux disease)    History of hiatal hernia    History of uterine prolapse    Hx of adenomatous polyp of colon 10/19/2014   Hyperlipidemia    Hypertension    borderline no meds diet and exercise   Nausea and vomiting 05/19/2023   Neuromuscular disorder (HCC)    neuropathy from chemo   Obesity    Ovarian cancer (HCC)    Peritoneal carcinomatosis (HCC)    PVC (premature ventricular contraction)    first dx by holter in 1980's, echo (6/10) EF 55-60%, normal diastolic fxn, normal size RV and fxn, mild MR, PASP   Squamous cell  carcinoma of arm    ovarian   Tubal pregnancy    Ulcerative proctosigmoiditis (HCC)    Urge urinary incontinence 06/03/2007   Qualifier: Diagnosis of  By: Cecilie CMA, NANNIE Mitzie Iline Lionel Events: Including procedures, antibiotic start and stop dates in addition to other pertinent events   M1 occlusion, unsuccessful thrombectomy 6/28-on ventilator, weaning well 6/28 MRI-large acute right MCA territory infarct with 4 mm leftward shift, occlusion of right M1 MCA 7/1 increased secretions, UA neg, tmax 100.5, PMT consult, s/p left thoracentesis 1400 out 7/2 copious secretions, increased WOB on full support, tmax 100.9 7/4 discussed GOC, family not really ready to engage  Interim History / Subjective:  No events overnight, moves right arm somewhat purposefully, does not follow commands. Unchanged.    Objective    Blood pressure 97/63, pulse 98, temperature 98.7 F (37.1 C), temperature source Axillary, resp. rate (!) 28, height 5' 2 (1.575 m), weight 71 kg, last menstrual period 05/29/1996, SpO2 98%.    Vent Mode: PSV FiO2 (%):  [40 %] 40 % Set Rate:  [18 bmp] 18 bmp Vt Set:  [400 mL] 400 mL PEEP:  [5 cmH20-8 cmH20] 8 cmH20 Plateau Pressure:  [15 cmH20-16 cmH20] 15 cmH20   Intake/Output Summary (Last 24 hours) at 08/04/2023 0948 Last data filed at 08/04/2023 0800 Gross per 24 hour  Intake 2843.45 ml  Output 1075 ml  Net 1768.45 ml   Filed Weights   07/27/23 0532 07/30/23 0600 08/02/23 0530  Weight: 73.5 kg 76.5 kg 71 kg   Examination:  General:  critically ill older female lying in bed in NAD HEENT: MM pink/moist, ETT/ OGT, pupils 2/r Neuro: unresponsive to noxious stimuli CV: rr, NSR, no murmur PULM: Coarse ventilated breath sounds pressure support with good volumes GI: soft, bs+, left ostomy with prolapsed stoma with brown liquid stool output Extremities: warm/dry, no  edema  Skin: no rashes  Labs and images reviewed  Resolved problem list    Assessment and Plan   M1 occlusion and PCA territory infarcts with cytotoxic edema with unsuccessful thrombectomy  - CTH 7/2 stable - per Stroke team - off HTS 7/1, trend Na, allow to gradually decline - serial neuro exams - maintain neuro protective measures - ongoing GOC, she has a large territory stroke with clinical decline on neurologic exam over time, her performance status will be poor she will be total care, I doubt she would be a candidate for ongoing chemotherapy, have discussed with family  Acute hypoxemic respiratory failure and ventilator dependence in setting of CVA PNA +/- atelectasis  - PRVC, PSV as tolerated, VAP bundle, stress ulcer prophylaxis - trach asp with normal flora> color and amount of secretions improving - Plan 5-day cefepime  - Failed trial of Precedex  with hypotension and bradycardia 7/3  HTN HLD - EF 60-65% - goal SBP < 180 - statin   Sepsis 2/2 suspected PNA Leukopenia -Plan 5 days cefepime  - follow cultures - trend WBC/ fever curve   Uterine cancer with peritoneal carcinomatosis - Supportive care - Holding home niraparib   Malignant pleural effusion - s/p thoracentesis 7/1, 1400 ml off> GS neg, follow culture (ngtd) and cytology, exudative by Lights criteria  Left gastrocnemius/calf vien DVT on Eliquis  - LE dopplers neg for DVT 6/27 - holding eliquis , heparin  per pharmacy   GOC - overall very poor prognosis.  Ongoing discussions with family, appreciate PMT assistance.    Best Practice (right click and Reselect all SmartList Selections daily)   Diet/type: tubefeeds DVT prophylaxis systemic heparin  Pressure ulcer(s): N/A GI prophylaxis: PPI Lines: N/A Foley:  N/A Code Status:  full code Last date of multidisciplinary goals of care discussion [updated husband 7/4]   CRITICAL CARE Performed by: Donnice SAUNDERS Pricilla Moehle   Total critical care time: 31 minutes  Critical care time was exclusive of separately billable procedures and  treating other patients.  Critical care was necessary to treat or prevent imminent or life-threatening deterioration.  Critical care was time spent personally by me on the following activities: development of treatment plan with patient and/or surrogate as well as nursing, discussions with consultants, evaluation of patient's response to treatment, examination of patient, obtaining history from patient or surrogate, ordering and performing treatments and interventions, ordering and review of laboratory studies, ordering and review of radiographic studies, pulse oximetry and re-evaluation of patient's condition.  Donnice SAUNDERS Beals, MD [facility] 08/04/2023, 9:48 AM  See Amion If no response, please call [phone] until 7pm After 7:00 pm call Elink  [phone]

## 2023-08-04 NOTE — Plan of Care (Signed)
  Problem: Nutrition: Goal: Risk of aspiration will decrease Outcome: Progressing Goal: Dietary intake will improve Outcome: Progressing   Problem: Clinical Measurements: Goal: Ability to maintain clinical measurements within normal limits will improve Outcome: Progressing Goal: Will remain free from infection Outcome: Progressing Goal: Cardiovascular complication will be avoided Outcome: Progressing   Problem: Nutrition: Goal: Adequate nutrition will be maintained Outcome: Progressing   Problem: Elimination: Goal: Will not experience complications related to bowel motility Outcome: Progressing Goal: Will not experience complications related to urinary retention Outcome: Progressing   Problem: Pain Managment: Goal: General experience of comfort will improve and/or be controlled Outcome: Progressing

## 2023-08-04 NOTE — Progress Notes (Addendum)
 STROKE TEAM PROGRESS NOTE   SIGNIFICANT HOSPITAL EVENTS 6/27- Admitted to ICU, intubated   INTERIM HISTORY/SUBJECTIVE Patient has been hemodynamically stable overnight and afebrile.  Neurological exam remains unchanged.  Family is in the process of making decisions regarding plans for care and did not wish to talk about extubation today. OBJECTIVE  CBC    Component Value Date/Time   WBC 7.9 08/04/2023 0341   RBC 2.88 (L) 08/04/2023 0341   HGB 8.9 (L) 08/04/2023 0341   HGB 11.0 (L) 04/26/2023 1043   HCT 28.8 (L) 08/04/2023 0341   PLT 282 08/04/2023 0341   PLT 199 04/26/2023 1043   MCV 100.0 08/04/2023 0341   MCH 30.9 08/04/2023 0341   MCHC 30.9 08/04/2023 0341   RDW 15.8 (H) 08/04/2023 0341   LYMPHSABS 0.2 (L) 07/26/2023 1134   MONOABS 0.1 07/26/2023 1134   EOSABS 0.0 07/26/2023 1134   BASOSABS 0.0 07/26/2023 1134    BMET    Component Value Date/Time   NA 143 08/04/2023 0341   K 3.8 08/04/2023 0341   CL 108 08/04/2023 0341   CO2 27 08/04/2023 0341   GLUCOSE 219 (H) 08/04/2023 0341   BUN 25 (H) 08/04/2023 0341   CREATININE 0.44 08/04/2023 0341   CREATININE 0.64 04/26/2023 1043   CALCIUM  9.4 08/04/2023 0341   GFRNONAA >60 08/04/2023 0341   GFRNONAA >60 04/26/2023 1043    IMAGING past 24 hours No results found.    Vitals:   08/04/23 0900 08/04/23 1000 08/04/23 1100 08/04/23 1200  BP: 102/63 121/68 121/75 131/80  Pulse: 84 (!) 101 (!) 104 (!) 109  Resp: (!) 28 (!) 25 (!) 31 (!) 29  Temp:      TempSrc:      SpO2: 99% 99% 93% 94%  Weight:      Height:         PHYSICAL EXAM General: Intubated well-nourished, well-developed elderly Caucasian lady in no acute distress Cardiovascular -irregular rate and rhythm with frequent PVC and PACs Respiratory: Respirations synchronous with ventilator   NEURO:  Mental Status: Patient is intubated on low-dose propofol .  Pupils are equal round and sluggishly reactive to light with pupils disconjugate and right downgaze,  corneal reflex present bilaterally, doll's eyes reflex present, cough reflex is intact, blinks to threat on the right but not on the left, will grimace to noxious stimuli, flickers right upper extremity to noxious and withdraws bilateral lower extremities, no movement of left upper extremity  ASSESSMENT/PLAN  Ms. RENETTE HSU is a 78 y.o. 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  ASPECTS of 7. No intracranial hemorrhage. CTA head & neck - Occlusion of the proximal/mid M1 segment right MCA with intraluminal filling defect concerning for thrombus. Few diminutive caliber M2 and M3 branches noted likely filled via collaterals. CT perfusion Core 62 Penumbra 132  S/p IR but unsuccessful with TICI 0 MRI Large acute right MCA territory infarct with 4 mm of leftward midline shift. No acute hemorrhage. MRA Head-  Persistent occlusion of the right M1 MCA. Repeat head CT - Confluent Right hemisphere cytotoxic edema has developed in both the right MCA and Right PCA vascular territories.  6/29 - Repeat CT Head - Confluent Right MCA And PCA territory infarcts. mass effect has mildly progressed including 2 mm of leftward midline shift. 7/1 CT repeat MLS 2-3 mm 2D Echo EF 60 to 65%.  Left atrial size normal.  No right-to-left shunt LDL 61 HgbA1c 6.3 VTE prophylaxis - heparin  IV Eliquis  (apixaban ) daily prior to admission, now on heparin  IV  Therapy  recommendations:  Pending Disposition:  Pending, palliative care on board, family is struggling for decision  Cerebral cytotoxic edema  HTS at 55ml/hr -> 115ml/hr 6/28 250cc bolus 6/29 250cc bolus  Hypertonic saline discontinued  7/1 CT repeat MLS 2-3 mm  Procedural intubation  Hypoxemic respiratory failure Hx of malignant pleural effusion PCCM consulted SBT/extubation per CCM CXR: Large left and small right pleural effusions.  7/1: Thoracentesis with 1400cc   Back on propofol   Hypertension Stable Blood Pressure Goal: BP less than 180/105  Long term BP goal normotensive  Hyperlipidemia Home meds:  Crestor  20mg , resumed in hospital LDL 61, goal < 70 Continue statin at discharge  Dysphagia Patient has post-stroke dysphagia SLP consulted Now NPO On TF @ 55  Peritoneal carcinomatosis undergoing chemo Follows with Dr. Katragadda at the [facility] Current protocol: niraparib  200 mg daily.  Gyn Onc-  Dr. Viktoria s/p omenectomy small bowel resection, s/p colostomy with colostomy prolapse   Hx left gastrocnemius/calf vein distal DVT Eliquis  5mg  BID PTA No evidence of DVT this admission On heparin  IV  Fever  Tmax 101.3--100.4--100.7--99.2 WBC 3.8--4.2--6.2--7.9 RN reports thick secretions Urinalysis negative Sputum culture growing mixed flora, finished course of cefepime  Chest x-ray shows bilateral pleural effusions and venous congestion  Hospital day # 9  Patient seen by NP with MD, MD to edit note as needed. Cortney E Everitt Clint Kill , MSN, AGACNP-BC [facility] See Amion for schedule and pager information 08/04/2023 1:23 PM  ATTENDING NOTE: I reviewed above note and agree with the assessment and plan. Pt was seen and examined.   RN is at the bedside. Pt still intubated. Overnight was put back on propofol . This morning she was drowsy sleepy and hard to arouse. Still able to localize to pain on the R arm, withdraw more on R leg than left LE.  LUE flaccid. Later today, her propofol  was discontinued and she was more awake alert and able to follow some simple commands per RN but became mildly tachypnic. Family still has difficult time to decide on GOC.   For detailed assessment and plan, please refer to above as I have made changes wherever appropriate.   Ary Cummins, MD PhD Stroke Neurology 08/04/2023 8:26 PM  This patient is critically ill due to R MCA infarct with R M1 occlusion, DVT on eliquis , respiratory failure, fever and at significant risk of neurological worsening, death form large R MCA stroke, hemorrhagic conversion, PE, cerebral edema, sepsis. This patient's care requires constant monitoring of vital signs, hemodynamics, respiratory and cardiac monitoring, review of multiple databases, neurological assessment, discussion with family, other specialists and medical decision making of high complexity. I spent 30 minutes of neurocritical care time in the care of this patient

## 2023-08-04 NOTE — Progress Notes (Signed)
 PHARMACY - ANTICOAGULATION CONSULT NOTE  Pharmacy Consult:  Heparin  Indication:  History of DVT, new CVA  Allergies  Allergen Reactions   Aspartame Other (See Comments)    Intolerance to artifical sweeteners, pt states caused burning of bladder   Levaquin  [Levofloxacin ] Nausea Only   Pantoprazole  Sodium Diarrhea    Abdominal pain   Ranitidine Diarrhea and Other (See Comments)    Abdominal pain, also   Omeprazole  Diarrhea and Other (See Comments)    Abdominal pain, also   Sulfasalazine  Rash and Other (See Comments)    Fever, chills, headache, muscle pain also    Patient Measurements: Height: 5' 2 (157.5 cm) Weight: 71 kg (156 lb 8.4 oz) IBW/kg (Calculated) : 50.1 HEPARIN  DW (KG): 66  Vital Signs: Temp: 99.7 F (37.6 C) (07/06 1200) Temp Source: Axillary (07/06 1200) BP: 131/80 (07/06 1200) Pulse Rate: 109 (07/06 1200)  Labs: Recent Labs    08/02/23 0435 08/02/23 1629 08/03/23 0340 08/03/23 0521 08/03/23 1158 08/04/23 0341  HGB 8.9*  --   --  8.9*  --  8.9*  HCT 28.4*  --   --  28.4*  --  28.8*  PLT 226  --   --  267  --  282  HEPARINUNFRC 0.18*   < > 0.30  --  0.31 0.35  CREATININE 0.40*  --   --  0.52  --  0.44   < > = values in this interval not displayed.    Estimated Creatinine Clearance: 56.1 mL/min (by C-G formula based on SCr of 0.44 mg/dL).   Assessment: 72 YOF with history of DVT on Eliquis  PTA, last dose on 6/26 at 2100.  Patient presented as a code stroke and thrombectomy was unsuccessful on 7/1.  Pharmacy consulted to dose IV heparin  per stroke protocol.    Heparin  level therapeutic (0.35) on infusion at 1200 units/hr. No bleeding noted.  Goal of Therapy:  Heparin  level 0.3-0.5 units/ml Monitor platelets by anticoagulation protocol: Yes   Plan:  Continue heparin  infusion at 1200 units/hr Daily heparin  level and CBC  Vito Ralph, PharmD, BCPS Please see amion for complete clinical pharmacist phone list 08/04/2023 2:11 PM

## 2023-08-04 NOTE — Progress Notes (Signed)
  HPI: 78 y.o. 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 M1 occlusion with unsuccessful occlusion. Hospitalization complicated by low grade fever with increased secretions. Also with plans to tap malignant pleural effusion.    Subjective: Medical records reviewed including progress notes, labs, imaging. Patient assessed at the bedside. Intubated and sedated. Husband is present visiting at the bedside. Discussed with PCCM MD prior to visit with family.  Created space and opportunity for family's thoughts and feelings on patient's current illness. Derrill shares his hope that they will be able to come to a decision early this week and they understand it is not in patient's best interest to postpone further than this. He anticipates funeral planning, picking a casket, and other thought processes that he expects after the final decision is made. He continues to hope that his children will accept this and notes that his daughter was asking about Debby's plants, which he is not ready to let go of yet. Emotional support and therapeutic listening was provided. Shared that my colleague would be back on service tomorrow to continue supporting. He will be at the bedside in the afternoon again.  Questions and concerns addressed.  PMT will continue to support holistically.    Exam: Sedated on vent.  Tachypneic.  Normal heart rate. Warm to touch. Abd soft. + ostomy.   Assessment: Goals of care conversation PCA infarcts with cytotoxic edema status post unsuccessful thrombectomy M1 occlusion Acute  hypoxic respiratory failure Urinary cancer with peritoneal carcinomatosis Malignant pleural effusion status post thoracentesis  Plan: - Continue DNR - Continue full scope treatment for now, allow more time for outcomes.  Family is expecting to decide for transition to comfort care and compassionate extubation early this week - Psychosocial and emotional support provided - Ongoing goals of care discussions - PMT to continue to follow and support   Visit consisted of counseling and education dealing with the complex and emotionally intense issues of symptom management and palliative care in the setting of serious and potentially life-threatening illness. Greater than 50% of this time was spent counseling and coordinating care related to the above assessment and plan.  Personally spent 35 minutes in patient care including extensive chart review (labs, imaging, progress/consult notes, vital signs), medically appropraite exam, discussed with treatment team, education to patient, family, and staff, documenting clinical information, medication review and management, coordination of care, and available advanced directive documents.       Hubert Raatz, PA-C Palliative Medicine Team Team phone # [phone]  Thank you for allowing the Palliative Medicine Team to assist in the care of this patient. Please utilize secure chat with additional questions, if there is no response within 30 minutes please call the above phone number.  Palliative Medicine Team providers are available by phone from 7am to 7pm daily and can be reached through the team cell phone.  Should this patient require assistance outside of these hours, please call the patient's attending physician.  Portions of this note are a verbal dictation therefore any spelling and/or grammatical errors are due to the Dragon [employer] One system interpretation.

## 2023-08-05 ENCOUNTER — Other Ambulatory Visit: Payer: Self-pay

## 2023-08-05 DIAGNOSIS — I63511 Cerebral infarction due to unspecified occlusion or stenosis of right middle cerebral artery: Secondary | ICD-10-CM | POA: Diagnosis not present

## 2023-08-05 DIAGNOSIS — I63411 Cerebral infarction due to embolism of right middle cerebral artery: Secondary | ICD-10-CM | POA: Diagnosis not present

## 2023-08-05 DIAGNOSIS — I63541 Cerebral infarction due to unspecified occlusion or stenosis of right cerebellar artery: Secondary | ICD-10-CM | POA: Diagnosis not present

## 2023-08-05 DIAGNOSIS — Z515 Encounter for palliative care: Secondary | ICD-10-CM | POA: Diagnosis not present

## 2023-08-05 DIAGNOSIS — I639 Cerebral infarction, unspecified: Secondary | ICD-10-CM | POA: Diagnosis not present

## 2023-08-05 DIAGNOSIS — C786 Secondary malignant neoplasm of retroperitoneum and peritoneum: Secondary | ICD-10-CM | POA: Diagnosis not present

## 2023-08-05 DIAGNOSIS — Z7189 Other specified counseling: Secondary | ICD-10-CM | POA: Diagnosis not present

## 2023-08-05 DIAGNOSIS — J9601 Acute respiratory failure with hypoxia: Secondary | ICD-10-CM | POA: Diagnosis not present

## 2023-08-05 DIAGNOSIS — J910 Malignant pleural effusion: Secondary | ICD-10-CM | POA: Diagnosis not present

## 2023-08-05 DIAGNOSIS — I69391 Dysphagia following cerebral infarction: Secondary | ICD-10-CM | POA: Diagnosis not present

## 2023-08-05 LAB — BASIC METABOLIC PANEL WITH GFR
Anion gap: 9 (ref 5–15)
BUN: 25 mg/dL — ABNORMAL HIGH (ref 8–23)
CO2: 28 mmol/L (ref 22–32)
Calcium: 9.4 mg/dL (ref 8.9–10.3)
Chloride: 102 mmol/L (ref 98–111)
Creatinine, Ser: 0.51 mg/dL (ref 0.44–1.00)
GFR, Estimated: >60 mL/min (ref >60)
Glucose, Bld: 197 mg/dL — ABNORMAL HIGH (ref 70–99)
Potassium: 3.7 mmol/L (ref 3.5–5.1)
Sodium: 139 mmol/L (ref 135–145)

## 2023-08-05 LAB — TRIGLYCERIDES: Triglycerides: 151 mg/dL — ABNORMAL HIGH (ref <150)

## 2023-08-05 LAB — CBC
HCT: 27.7 % — ABNORMAL LOW (ref 36.0–46.0)
Hemoglobin: 9 g/dL — ABNORMAL LOW (ref 12.0–15.0)
MCH: 32.4 pg (ref 26.0–34.0)
MCHC: 32.5 g/dL (ref 30.0–36.0)
MCV: 99.6 fL (ref 80.0–100.0)
Platelets: 278 K/uL (ref 150–400)
RBC: 2.78 MIL/uL — ABNORMAL LOW (ref 3.87–5.11)
RDW: 15.6 % — ABNORMAL HIGH (ref 11.5–15.5)
WBC: 8 K/uL (ref 4.0–10.5)
nRBC: 0 % (ref 0.0–0.2)

## 2023-08-05 LAB — GLUCOSE, CAPILLARY
Glucose-Capillary: 134 mg/dL — ABNORMAL HIGH (ref 70–99)
Glucose-Capillary: 169 mg/dL — ABNORMAL HIGH (ref 70–99)
Glucose-Capillary: 178 mg/dL — ABNORMAL HIGH (ref 70–99)
Glucose-Capillary: 181 mg/dL — ABNORMAL HIGH (ref 70–99)
Glucose-Capillary: 182 mg/dL — ABNORMAL HIGH (ref 70–99)
Glucose-Capillary: 187 mg/dL — ABNORMAL HIGH (ref 70–99)
Glucose-Capillary: 200 mg/dL — ABNORMAL HIGH (ref 70–99)

## 2023-08-05 LAB — HEPARIN LEVEL (UNFRACTIONATED): Heparin Unfractionated: 0.35 [IU]/mL (ref 0.30–0.70)

## 2023-08-05 MED ORDER — INSULIN ASPART 100 UNIT/ML IJ SOLN
0.0000 [IU] | INTRAMUSCULAR | Status: DC
  Administered 2023-08-05 – 2023-08-06 (×5): 4 [IU] via SUBCUTANEOUS
  Administered 2023-08-06: 7 [IU] via SUBCUTANEOUS
  Administered 2023-08-06 – 2023-08-07 (×10): 4 [IU] via SUBCUTANEOUS
  Administered 2023-08-08: 3 [IU] via SUBCUTANEOUS

## 2023-08-05 MED ORDER — FREE WATER
150.0000 mL | Status: DC
  Administered 2023-08-05 – 2023-08-06 (×6): 150 mL

## 2023-08-05 MED ORDER — POTASSIUM CHLORIDE 20 MEQ PO PACK
40.0000 meq | PACK | Freq: Once | ORAL | Status: AC
  Administered 2023-08-05: 40 meq
  Filled 2023-08-05: qty 2

## 2023-08-05 MED ORDER — LABETALOL HCL 5 MG/ML IV SOLN: 10.0000 mg | INTRAVENOUS | Status: DC | PRN

## 2023-08-05 NOTE — Progress Notes (Signed)
 PHARMACY - ANTICOAGULATION CONSULT NOTE  Pharmacy Consult:  Heparin  Indication:  History of DVT, new CVA  Allergies  Allergen Reactions   Aspartame Other (See Comments)    Intolerance to artifical sweeteners, pt states caused burning of bladder   Levaquin  [Levofloxacin ] Nausea Only   Pantoprazole  Sodium Diarrhea    Abdominal pain   Ranitidine Diarrhea and Other (See Comments)    Abdominal pain, also   Omeprazole  Diarrhea and Other (See Comments)    Abdominal pain, also   Sulfasalazine  Rash and Other (See Comments)    Fever, chills, headache, muscle pain also    Patient Measurements: Height: 5' 2 (157.5 cm) Weight: 71 kg (156 lb 8.4 oz) IBW/kg (Calculated) : 50.1 HEPARIN  DW (KG): 66  Vital Signs: Temp: 100.1 F (37.8 C) (07/07 0400) Temp Source: Axillary (07/07 0400) BP: 104/63 (07/07 0700) Pulse Rate: 96 (07/07 0700)  Labs: Recent Labs    08/03/23 0521 08/03/23 1158 08/04/23 0341 08/05/23 0353  HGB 8.9*  --  8.9* 9.0*  HCT 28.4*  --  28.8* 27.7*  PLT 267  --  282 278  HEPARINUNFRC  --  0.31 0.35 0.35  CREATININE 0.52  --  0.44 0.51    Estimated Creatinine Clearance: 56.1 mL/min (by C-G formula based on SCr of 0.51 mg/dL).   Assessment: 9 YOF with history of DVT on Eliquis  PTA, last dose on 6/26 at 2100.  Patient presented as a code stroke and thrombectomy was unsuccessful on 7/1.  Pharmacy consulted to dose IV heparin  per stroke protocol.    Heparin  level therapeutic and stable (0.35) on heparin  1200 units/hr CBC stable  Goal of Therapy:  Heparin  level 0.3-0.5 units/ml Monitor platelets by anticoagulation protocol: Yes   Plan:  Continue heparin  infusion at 1200 units/hr Daily heparin  level and CBC  Sharyne Glatter, PharmD, BCCCP Clinical Pharmacist 08/05/2023 7:41 AM

## 2023-08-05 NOTE — Progress Notes (Addendum)
 STROKE TEAM PROGRESS NOTE   SIGNIFICANT HOSPITAL EVENTS 6/27- Admitted to ICU, intubated   INTERIM HISTORY/SUBJECTIVE No family at the bedside.  Patient remains intubated on no sedation. On heparin  drip Labs are stable, vitals are stable  CBC    Component Value Date/Time   WBC 8.0 08/05/2023 0353   RBC 2.78 (L) 08/05/2023 0353   HGB 9.0 (L) 08/05/2023 0353   HGB 11.0 (L) 04/26/2023 1043   HCT 27.7 (L) 08/05/2023 0353   PLT 278 08/05/2023 0353   PLT 199 04/26/2023 1043   MCV 99.6 08/05/2023 0353   MCH 32.4 08/05/2023 0353   MCHC 32.5 08/05/2023 0353   RDW 15.6 (H) 08/05/2023 0353   LYMPHSABS 0.2 (L) 07/26/2023 1134   MONOABS 0.1 07/26/2023 1134   EOSABS 0.0 07/26/2023 1134   BASOSABS 0.0 07/26/2023 1134    BMET    Component Value Date/Time   NA 139 08/05/2023 0353   K 3.7 08/05/2023 0353   CL 102 08/05/2023 0353   CO2 28 08/05/2023 0353   GLUCOSE 197 (H) 08/05/2023 0353   BUN 25 (H) 08/05/2023 0353   CREATININE 0.51 08/05/2023 0353   CREATININE 0.64 04/26/2023 1043   CALCIUM  9.4 08/05/2023 0353   GFRNONAA >60 08/05/2023 0353   GFRNONAA >60 04/26/2023 1043    IMAGING past 24 hours No results found.    Vitals:   08/05/23 0900 08/05/23 1035 08/05/23 1126 08/05/23 1200  BP: 107/73  (!) 122/99   Pulse: 93  (!) 115   Resp:   (!) 34   Temp:    99.3 F (37.4 C)  TempSrc:    Axillary  SpO2: 98% 93% 95%   Weight:      Height:         PHYSICAL EXAM General: Intubated well-nourished, well-developed elderly Caucasian lady in no acute distress Cardiovascular -irregular rate and rhythm with frequent PVC and PACs Respiratory: Respirations synchronous with ventilator   NEURO:  Mental Status: Patient is intubated following commands.  Pupils are equal round and sluggishly reactive to light with pupils disconjugate and right downgaze, corneal reflex present bilaterally, doll's eyes reflex present, cough reflex is intact, blinks to threat on the right but not on the  left, will grimace to noxious stimuli, patient is localizing on right arm and right leg with some spontaneous movement, left arm no response to pain, left leg withdraws to pain  ASSESSMENT/PLAN  Rachel Carlson is a 78 y.o. 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 Rachel Carlson  NIH on Admission 20  Stroke: large right MCA and tiny R cerebellar infarcts with R M1 occlusion s/p unsuccessful IR with TICI 0, etiology concerning for hypercoaguable state in the setting of advanced malignancy  Code Stroke CT head- Acute right MCA infarct.  ASPECTS of 7. No intracranial hemorrhage. CTA head & neck - Occlusion of the proximal/mid M1 segment right MCA with intraluminal filling defect concerning for thrombus. Few diminutive caliber M2 and M3 branches noted likely filled via collaterals. CT perfusion Core 62 Penumbra 132  S/p IR but unsuccessful with TICI 0 MRI Large acute right MCA territory infarct with 4 mm of leftward midline shift. No acute hemorrhage. MRA Head- Persistent occlusion of the right M1 MCA. Repeat head CT -  Confluent Right hemisphere cytotoxic edema has developed in both the right MCA and Right PCA vascular territories.  6/29 - Repeat CT Head - Confluent Right MCA And PCA territory infarcts. mass effect has mildly progressed including 2 mm of leftward midline shift. 7/1 CT repeat MLS 2-3 mm 2D Echo EF 60 to 65%.  Left atrial size normal.  No right-to-left shunt LDL 61 HgbA1c 6.3 VTE prophylaxis - heparin  IV Eliquis  (apixaban ) daily prior to admission, now on heparin  IV  Therapy recommendations:  Pending Disposition:  Pending, palliative care on board, family plan for one-way  extubation  Cerebral cytotoxic edema  HTS at 6ml/hr -> 184ml/hr 6/28 250cc bolus 6/29 250cc bolus  Hypertonic saline discontinued  7/1 CT repeat MLS 2-3 mm  Procedural intubation  Hypoxemic respiratory failure Hx of malignant pleural effusion PCCM consulted SBT/extubation per CCM CXR: Large left and small right pleural effusions.  7/1: Thoracentesis with 1400cc   Back on propofol   Hypertension Stable Blood Pressure Goal: BP less than 180/105  Long term BP goal normotensive  Hyperlipidemia Home meds:  Crestor  20mg , resumed in hospital LDL 61, goal < 70 Continue statin at discharge  Dysphagia Patient has post-stroke dysphagia SLP consulted Now NPO On TF @ 55  Peritoneal carcinomatosis undergoing chemo Follows with [facility]. Katragadda [facility] Current protocol: niraparib  200 mg daily.  Gyn Onc-  Dr. Viktoria s/p omenectomy small bowel resection, s/p colostomy with colostomy prolapse   Hx left gastrocnemius/calf vein distal DVT Eliquis  5mg  BID PTA No evidence of DVT this admission On heparin  IV  Fever  Tmax 101.3--100.4--100.7--99.2-100.1 WBC 3.8--4.2--6.2--7.9--8.0 RN reports thick secretions Urinalysis negative Sputum culture growing mixed flora, finished course of cefepime  Chest x-ray shows bilateral pleural effusions and venous congestion  Hospital day # 10  Patient seen by NP with MD, MD to edit note as needed.   Rachel Geralds DNP, ACNPC-AG  [facility]  ATTENDING NOTE: I reviewed above note and agree with the assessment and plan. Pt was seen and examined.   No family at bedside.  Patient still intubated, off propofol , able to follow simple commands and right hand with extremity delayed fashion.  Withdraw stronger on the RLE than LLE.  Right upper extremity localizes to pain and against gravity, left upper extremity only slight withdraw to pain.  Palliative care on board, had a GOC discussion with family, plan for [place]  sedation soon.  Continue heparin  IV  For detailed assessment and plan, please refer to above as I have made changes wherever appropriate.   Rachel Cummins, MD PhD Stroke Neurology 08/05/2023 6:47 PM  This patient is critically ill due to R MCA infarct with R M1 occlusion, DVT on eliquis , respiratory failure, fever and at significant risk of neurological worsening, death form large R MCA stroke, hemorrhagic conversion, PE, cerebral edema, sepsis. This patient's care requires constant monitoring of vital signs, hemodynamics, respiratory and cardiac monitoring, review of multiple databases, neurological assessment, discussion with family, other specialists and medical decision making of high complexity. I spent 35 minutes of neurocritical care time in the care of this patient

## 2023-08-05 NOTE — Progress Notes (Addendum)
 NAME:  Rachel Carlson, MRN:  [id number], DOB:  Aug 05, 1945, LOS: 10 ADMISSION DATE:  07/26/2023, CONSULTATION DATE: 07/26/2023 REFERRING MD: Dr. 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 LKW 10:00. Was found sometime after than on the toilet, had falled couldn't get up and had L sided weakness, R gaze, L facial droop.  Imaging in ED w M1 occlusion. In ED was lethargic but able to state that she took eliquis  this morning.   She went for thrombectomy w NIR, unsuccessful revasc.  Remains intubated after the case as CRNA saw possible posturing and there were concerns regarding plausibility of airway protection, PCCM consulted.    Pertinent  Medical History   Past Medical History:  Diagnosis Date   Blood transfusion without reported diagnosis    Chronic right ear pain    normal MRI   Diverticulosis    DVT of leg (deep venous thrombosis) (HCC)    Esophageal stricture    Family history of breast cancer    Family history of colon cancer    Family history of melanoma    Family history of prostate cancer    Fibroids 1998   uterine, history of (left ovaries)   GERD (gastroesophageal reflux disease)    History of hiatal hernia    History of uterine prolapse    Hx of adenomatous polyp of colon 10/19/2014   Hyperlipidemia    Hypertension    borderline no meds diet and exercise   Nausea and vomiting 05/19/2023   Neuromuscular disorder (HCC)    neuropathy from chemo   Obesity    Ovarian cancer (HCC)    Peritoneal carcinomatosis (HCC)    PVC (premature ventricular contraction)    first dx by holter in 1980's, echo (6/10) EF 55-60%, normal diastolic fxn, normal size RV and fxn, mild MR, PASP   Squamous cell  carcinoma of arm    ovarian   Tubal pregnancy    Ulcerative proctosigmoiditis (HCC)    Urge urinary incontinence 06/03/2007   Qualifier: Diagnosis of  By: Cecilie CMA, NANNIE Mitzie Iline Lionel Events: Including procedures, antibiotic start and stop dates in addition to other pertinent events   M1 occlusion, unsuccessful thrombectomy 6/28-on ventilator, weaning well 6/28 MRI-large acute right MCA territory infarct with 4 mm leftward shift, occlusion of right M1 MCA 7/1 increased secretions, UA neg, tmax 100.5, PMT consult, s/p left thoracentesis 1400 out 7/2 copious secretions, increased WOB on full support, tmax 100.9 7/4 discussed GOC, family not really ready to engage  Interim History / Subjective:  No neurologic change. Moving RE to painful stimuli weaker on left. Not following commands. Placed on PSV and became tachypneic w/ tidal volumes in 200s.   Objective    Blood pressure 104/63, pulse 96, temperature 100.1 F (37.8 C), temperature source Axillary, resp. rate (!) 27, height 5' 2 (1.575 m), weight 71 kg, last menstrual period 05/29/1996, SpO2 97%.    Vent Mode: PRVC FiO2 (%):  [40 %] 40 % Set Rate:  [18 bmp] 18 bmp Vt Set:  [400 mL] 400 mL PEEP:  [5 cmH20-8 cmH20] 5 cmH20 Plateau Pressure:  [14 cmH20-18 cmH20] 14 cmH20   Intake/Output Summary (Last 24 hours) at 08/05/2023 9271  Last data filed at 08/05/2023 0701 Gross per 24 hour  Intake 1707.32 ml  Output 1400 ml  Net 307.32 ml   Filed Weights   07/27/23 0532 07/30/23 0600 08/02/23 0530  Weight: 73.5 kg 76.5 kg 71 kg   Examination:  General:  critically ill appearing on mech vent HEENT: MM pink/moist; ETT in place Neuro: sedation turned off; Moving RE to painful stimuli some but weaker on left. Not following commands; perrl CV: s1s2, RRR, no m/r/g PULM:  dim clear BS bilaterally; on mech vent PRVC GI: soft, left ostomy in place Extremities: warm/dry, no edema    Resolved problem list    Assessment and Plan   M1 occlusion and PCA territory infarcts with cytotoxic edema with unsuccessful thrombectomy  - CTH 7/2 stable Plan: -stroke following; appreciate recs -frequent neuro checks -limit sedating meds -currently off HTS since 7/1; allowing to gradually decline -prn labetalol ; BP goal <180/105 per neuro -statin -Continue neuroprotective measures- normothermia, euglycemia, HOB greater than 30, head in neutral alignment, normocapnia, normoxia  Acute hypoxemic respiratory failure and ventilator dependence in setting of CVA PNA +/- atelectasis  Plan: -failed sbt due to high RR/low vt and mental status -LTVV strategy with tidal volumes of 6-8 cc/kg ideal body weight -Wean PEEP/FiO2 for SpO2 >92% -VAP bundle in place -Daily SAT and SBT -PAD protocol in place; (Failed trial of Precedex  with hypotension and bradycardia 7/3) -wean sedation for RASS goal 0 to -1 -completed 5 day course of cefepime  on 7/5 -monitor wbc/fever curve off abx  HTN HLD - echo EF 60-65% Plan: -currently normotensive -prn labetalol  for BP goal <180/105 per neuro -statin  Uterine cancer with peritoneal carcinomatosis Plan: - Supportive care - Holding home niraparib   Malignant pleural effusion - s/p thoracentesis 7/1, 1400 ml off> GS neg, follow culture (ngtd) and cytology, exudative by Lights criteria Plan: -trend cxr -cytology pending from 7/1  Left gastrocnemius/calf vien DVT on Eliquis  - LE dopplers neg for DVT 6/27 Plan: -cont heparin  per pharmacy  Prediabetes Plan: -increase SSI -cbg monitoring  GOC Plan: - overall very poor prognosis.  Ongoing discussions with family, appreciate PMT assistance.    Best Practice (right click and Reselect all SmartList Selections daily)   Diet/type: tubefeeds DVT prophylaxis systemic heparin  Pressure ulcer(s): N/A GI prophylaxis: PPI Lines: N/A Foley:  N/A Code Status:  full code Last date of multidisciplinary goals of care  discussion [7/7 called son over phone; Son and spouse plan on coming in later this morning for GOC conversation]   CRITICAL CARE Performed by: Norleen JONETTA Cedar   Total critical care time: 35 minutes   Norleen JONETTA Cedar, PA-C  Pulmonary & Critical Care 08/05/2023, 7:28 AM  See Amion If no response, please call [phone] until 7pm After 7:00 pm call Elink  [phone]

## 2023-08-05 NOTE — Progress Notes (Signed)
 Palliative:  HPI: 78 y.o. 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 M1 occlusion with unsuccessful occlusion. Hospitalization complicated by low grade fever with increased secretions. Also with plans to tap malignant pleural effusion.    I met today at Rachel Carlson's bedside along with husband and son. Daughter has returned to [place]  for work commitment (she is a Runner, broadcasting/film/video and has a course she has to take). I spent time catching up on family who share that there has not been much change since when we last spoke last Wednesday. I did see her earlier when she was on weaning mode. She was tolerating okay and she appeared to attempt to open eyes to command but unable to actually open her eyes. She did flinch to touch to right foot. No distress. When I returned to speak with family she is back on rest mode and propofol  started back - still with some tachypnea, accessory muscle use, and diaphoretic. Discussed with family about path forward.   We spent time discussing that we are nearing a time when we have to make a decision about the ETT tube. We discussed one way extubation vs tracheostomy. Husband immediately shakes his head no when I mention trach. Son at bedside also share that he wouldn't want that for her with the caveat that she had significant improvement over the next couple days. At this time they plan to reach out to daughter (in [place] ) and Rachel Carlson's brother (in [place]) to see if they wish to visit with her before ETT removed. We did discuss that anyone who desires a visit should do so before the tube  comes out as we do not expect her to do well on her own. We reviewed again how she has so much working against her with stroke in the setting of underlying incurable cancer. Family expresses understanding. They report that this may not be able to happen until maybe Thursday to allow family time to return to [facility]. Family did verify that they were able to coordinate a visit from priest as they feel Rachel Carlson would desire.   RODGER Cedar, PA also to bedside and updated.   All questions/concerns addressed to the best of my ability. Emotional support provided.   Exam: Initially responding a little when on weaning mode and off sedation. Sedation had to be restarted after worn out from weaning - now diaphoretic and much effort and time to get her breathing more relaxed. No overt distress. Abd soft, + ostomy. Legs/feet cool to touch. More movement on R side - no movement noted on L side other than reflexive toe curl.   Plan: - DNR in place - Family discussing potential timing of one way extubation - ongoing discussion and support  60 min  Bernarda Kitty, NP Palliative Medicine Team Pager [phone] (Please see [email or web address] for schedule) Team Phone [phone]

## 2023-08-05 NOTE — TOC Progression Note (Signed)
 Transition of Care [facility]) - Progression Note    Patient Details  Name: Rachel Carlson MRN: [id number] Date of Birth: [birth year]  [facility] CM/SW Contact  Robynn Eileen Hoose, RN Phone Number: 08/05/2023, 3:20 PM  Clinical Narrative:   Chart reviewed. Palliative care met with family at bedside. Plan to talk with out of town family members and to coordinate visits before proceeding with possible extubation.    Expected Discharge Plan: Skilled Nursing Facility Barriers to Discharge: Continued Medical Work up, English as a second language teacher, SNF Pending bed offer  Expected Discharge Plan and Services In-house Referral: Clinical Social Work     Living arrangements for the past 2 months: Single Family Home                                       Social Determinants of Health (SDOH) Interventions SDOH Screenings   Food Insecurity: Patient Unable To Answer (07/26/2023)  Housing: Unknown (07/01/2023)   Received from [facility]  Transportation Needs: No Transportation Needs (05/16/2023)  Utilities: Not At Risk (05/16/2023)  Alcohol  Screen: Low Risk  (08/28/2022)  Depression (PHQ2-9): Low Risk  (07/23/2023)  Financial Resource Strain: Low Risk  (08/28/2022)  Physical Activity: Inactive (08/28/2022)  Social Connections: Moderately Integrated (05/16/2023)  Stress: No Stress Concern Present (08/28/2022)  Tobacco Use: Low Risk  (07/23/2023)  Health Literacy: Adequate Health Literacy (08/28/2022)    Readmission Risk Interventions    07/29/2023   12:37 PM 06/01/2023    2:31 PM 05/17/2023    1:19 PM  Readmission Risk Prevention Plan  Transportation Screening Complete Complete Complete  PCP or Specialist Appt within 3-5 Days   Complete  HRI or Home Care Consult   Complete  Social Work Consult for Recovery Care Planning/Counseling   Complete  Palliative Care Screening   Not Applicable  Medication Review Oceanographer) Complete Complete Complete  PCP or  Specialist appointment within 3-5 days of discharge Complete Complete   HRI or Home Care Consult Complete Complete   SW Recovery Care/Counseling Consult Complete Complete   Palliative Care Screening Not Applicable Complete   Skilled Nursing Facility Complete Not Applicable

## 2023-08-06 ENCOUNTER — Encounter (HOSPITAL_COMMUNITY): Payer: Self-pay | Admitting: Neurology

## 2023-08-06 ENCOUNTER — Other Ambulatory Visit: Payer: Self-pay

## 2023-08-06 DIAGNOSIS — I63511 Cerebral infarction due to unspecified occlusion or stenosis of right middle cerebral artery: Secondary | ICD-10-CM | POA: Diagnosis not present

## 2023-08-06 DIAGNOSIS — Z515 Encounter for palliative care: Secondary | ICD-10-CM | POA: Diagnosis not present

## 2023-08-06 DIAGNOSIS — I63541 Cerebral infarction due to unspecified occlusion or stenosis of right cerebellar artery: Secondary | ICD-10-CM | POA: Diagnosis not present

## 2023-08-06 DIAGNOSIS — J9601 Acute respiratory failure with hypoxia: Secondary | ICD-10-CM | POA: Diagnosis not present

## 2023-08-06 DIAGNOSIS — C786 Secondary malignant neoplasm of retroperitoneum and peritoneum: Secondary | ICD-10-CM | POA: Diagnosis not present

## 2023-08-06 DIAGNOSIS — Z7189 Other specified counseling: Secondary | ICD-10-CM | POA: Diagnosis not present

## 2023-08-06 DIAGNOSIS — I63411 Cerebral infarction due to embolism of right middle cerebral artery: Secondary | ICD-10-CM | POA: Diagnosis not present

## 2023-08-06 DIAGNOSIS — I69391 Dysphagia following cerebral infarction: Secondary | ICD-10-CM | POA: Diagnosis not present

## 2023-08-06 LAB — CBC
HCT: 29.7 % — ABNORMAL LOW (ref 36.0–46.0)
Hemoglobin: 9.5 g/dL — ABNORMAL LOW (ref 12.0–15.0)
MCH: 32 pg (ref 26.0–34.0)
MCHC: 32 g/dL (ref 30.0–36.0)
MCV: 100 fL (ref 80.0–100.0)
Platelets: 341 K/uL (ref 150–400)
RBC: 2.97 MIL/uL — ABNORMAL LOW (ref 3.87–5.11)
RDW: 15.9 % — ABNORMAL HIGH (ref 11.5–15.5)
WBC: 9.6 K/uL (ref 4.0–10.5)
nRBC: 0 % (ref 0.0–0.2)

## 2023-08-06 LAB — GLUCOSE, CAPILLARY
Glucose-Capillary: 178 mg/dL — ABNORMAL HIGH (ref 70–99)
Glucose-Capillary: 179 mg/dL — ABNORMAL HIGH (ref 70–99)
Glucose-Capillary: 179 mg/dL — ABNORMAL HIGH (ref 70–99)
Glucose-Capillary: 182 mg/dL — ABNORMAL HIGH (ref 70–99)
Glucose-Capillary: 183 mg/dL — ABNORMAL HIGH (ref 70–99)
Glucose-Capillary: 217 mg/dL — ABNORMAL HIGH (ref 70–99)

## 2023-08-06 LAB — CYTOLOGY - NON PAP

## 2023-08-06 LAB — BASIC METABOLIC PANEL WITH GFR
Anion gap: 9 (ref 5–15)
BUN: 21 mg/dL (ref 8–23)
CO2: 25 mmol/L (ref 22–32)
Calcium: 9.6 mg/dL (ref 8.9–10.3)
Chloride: 103 mmol/L (ref 98–111)
Creatinine, Ser: 0.71 mg/dL (ref 0.44–1.00)
GFR, Estimated: >60 mL/min (ref >60)
Glucose, Bld: 187 mg/dL — ABNORMAL HIGH (ref 70–99)
Potassium: 3.9 mmol/L (ref 3.5–5.1)
Sodium: 137 mmol/L (ref 135–145)

## 2023-08-06 LAB — HEPARIN LEVEL (UNFRACTIONATED): Heparin Unfractionated: 0.31 [IU]/mL (ref 0.30–0.70)

## 2023-08-06 MED ORDER — FREE WATER
100.0000 mL | Status: AC
  Administered 2023-08-06 – 2023-08-07 (×9): 100 mL

## 2023-08-06 NOTE — Progress Notes (Addendum)
 STROKE TEAM PROGRESS NOTE   SIGNIFICANT HOSPITAL EVENTS 6/27- Admitted to ICU, intubated   INTERIM HISTORY/SUBJECTIVE No family at the bedside.  Patient is still on heparin  drip. Remains intubated on no sedation.  Looks slightly uncomfortable with her breathing, she is more lethargic today, localizes with right arm and following commands Labs are stable.  Vitals are stable, she is slightly tachycardic CBC    Component Value Date/Time   WBC 9.6 08/06/2023 0426   RBC 2.97 (L) 08/06/2023 0426   HGB 9.5 (L) 08/06/2023 0426   HGB 11.0 (L) 04/26/2023 1043   HCT 29.7 (L) 08/06/2023 0426   PLT 341 08/06/2023 0426   PLT 199 04/26/2023 1043   MCV 100.0 08/06/2023 0426   MCH 32.0 08/06/2023 0426   MCHC 32.0 08/06/2023 0426   RDW 15.9 (H) 08/06/2023 0426   LYMPHSABS 0.2 (L) 07/26/2023 1134   MONOABS 0.1 07/26/2023 1134   EOSABS 0.0 07/26/2023 1134   BASOSABS 0.0 07/26/2023 1134    BMET    Component Value Date/Time   NA 137 08/06/2023 0426   K 3.9 08/06/2023 0426   CL 103 08/06/2023 0426   CO2 25 08/06/2023 0426   GLUCOSE 187 (H) 08/06/2023 0426   BUN 21 08/06/2023 0426   CREATININE 0.71 08/06/2023 0426   CREATININE 0.64 04/26/2023 1043   CALCIUM  9.6 08/06/2023 0426   GFRNONAA >60 08/06/2023 0426   GFRNONAA >60 04/26/2023 1043    IMAGING past 24 hours No results found.    Vitals:   08/06/23 1300 08/06/23 1315 08/06/23 1330 08/06/23 1345  BP: 130/79     Pulse: (!) 115 (!) 116 (!) 113 (!) 114  Resp: (!) 31 (!) 52 (!) 25 (!) 27  Temp:      TempSrc:      SpO2: 93% 94% 92% 94%  Weight:      Height:         PHYSICAL EXAM General: Intubated well-nourished, well-developed elderly Caucasian lady in no acute distress Cardiovascular -irregular rate and rhythm with frequent PVC and PACs Respiratory: Respirations synchronous with ventilator   NEURO:  Mental Status: Patient is intubated following commands.  Pupils are equal round and sluggishly reactive to light with pupils  disconjugate and right downgaze, corneal reflex present bilaterally, doll's eyes reflex present, cough reflex is intact, blinks to threat on the right but not on the left, will grimace to noxious stimuli, patient is localizing on right arm and right leg with some spontaneous movement, left arm no response to pain, left leg withdraws to pain  ASSESSMENT/PLAN  [name] is a 78 y.o. 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  ASPECTS of 7. No intracranial hemorrhage. CTA head & neck - Occlusion of the proximal/mid M1 segment right MCA with intraluminal filling defect concerning for thrombus. Few diminutive caliber M2 and M3 branches noted likely filled via collaterals. CT perfusion Core 62 Penumbra 132  S/p IR but unsuccessful with TICI 0 MRI Large acute  right MCA territory infarct with 4 mm of leftward midline shift. No acute hemorrhage. MRA Head- Persistent occlusion of the right M1 MCA. Repeat head CT - Confluent Right hemisphere cytotoxic edema has developed in both the right MCA and Right PCA vascular territories.  6/29 - Repeat CT Head - Confluent Right MCA And PCA territory infarcts. mass effect has mildly progressed including 2 mm of leftward midline shift. 7/1 CT repeat MLS 2-3 mm 2D Echo EF 60 to 65%.  Left atrial size normal.  No right-to-left shunt LDL 61 HgbA1c 6.3 VTE prophylaxis - heparin  IV Eliquis  (apixaban ) daily  prior to admission, now on heparin  IV  Therapy recommendations:  Pending Disposition:  Pending, palliative care on board, family plan for one-way extubation  Cerebral cytotoxic edema  HTS at 31ml/hr -> 165ml/hr 6/28 250cc bolus 6/29 250cc bolus  Hypertonic saline discontinued  7/1 CT repeat MLS 2-3 mm  Procedural intubation  Hypoxemic respiratory failure Hx of malignant pleural effusion PCCM consulted SBT/extubation per CCM CXR: Large left and small right pleural effusions.  7/1: Thoracentesis with 1400cc   Back on propofol   Hypertension Stable Blood Pressure Goal: BP less than 180/105  Long term BP goal normotensive  Hyperlipidemia Home meds:  Crestor  20mg , resumed in hospital LDL 61, goal < 70 Continue statin at discharge  Dysphagia Patient has post-stroke dysphagia SLP consulted Now NPO On TF @ 55  Peritoneal carcinomatosis undergoing chemo Follows with Dr. Katragadda at the [facility] Current protocol: niraparib  200 mg daily.  Gyn Onc-  Dr. Viktoria s/p omenectomy small bowel resection, s/p colostomy with colostomy prolapse   Hx left gastrocnemius/calf vein distal DVT Eliquis  5mg  BID PTA No evidence of DVT this admission On heparin  IV  Fever  Tmax 101.3--100.4--100.7--99.2-100.1-100.3 WBC 3.8--4.2--6.2--7.9--8.0 RN reports thick secretions Urinalysis negative Sputum culture growing mixed flora, finished course of cefepime  Chest x-ray shows bilateral pleural effusions and venous congestion  Hospital day # 11  Patient seen by NP with MD, MD to edit note as needed.   Karna Geralds DNP, ACNPC-AG  [facility]  ATTENDING NOTE: I reviewed above note and agree with the assessment and plan. Pt was seen and examined.   No family at bedside.  Patient still intubated, extremely lethargic, however with repetitive stimulation she was able to follow simple commands on the right arm and foot with delayed response.  Still on heparin  IV,  palliative care on board, plan for 1 weeks duration in the coming days.  For detailed assessment and plan, please refer to above as I have made changes wherever appropriate.   Ary Cummins, MD PhD Stroke Neurology 08/06/2023 6:59 PM  This patient is critically ill due to R MCA infarct with R M1 occlusion, DVT on eliquis , respiratory failure, fever and at significant risk of neurological worsening, death form large R MCA stroke, hemorrhagic conversion, PE, cerebral edema, sepsis. This patient's care requires constant monitoring of vital signs, hemodynamics, respiratory and cardiac monitoring, review of multiple databases, neurological assessment, discussion with family, other specialists and medical decision making of high complexity. I spent 30 minutes of neurocritical care time in the care of this patient

## 2023-08-06 NOTE — Progress Notes (Signed)
 PHARMACY - ANTICOAGULATION CONSULT NOTE  Pharmacy Consult:  Heparin  Indication:  History of DVT, new CVA  Allergies  Allergen Reactions   Aspartame Other (See Comments)    Intolerance to artifical sweeteners, pt states caused burning of bladder   Levaquin  [Levofloxacin ] Nausea Only   Pantoprazole  Sodium Diarrhea    Abdominal pain   Ranitidine Diarrhea and Other (See Comments)    Abdominal pain, also   Omeprazole  Diarrhea and Other (See Comments)    Abdominal pain, also   Sulfasalazine  Rash and Other (See Comments)    Fever, chills, headache, muscle pain also    Patient Measurements: Height: 5' 2 (157.5 cm) Weight: 71 kg (156 lb 8.4 oz) IBW/kg (Calculated) : 50.1 HEPARIN  DW (KG): 66  Vital Signs: Temp: 100 F (37.8 C) (07/08 0400) Temp Source: Axillary (07/08 0400) BP: 129/78 (07/08 0700) Pulse Rate: 101 (07/08 0700)  Labs: Recent Labs    08/04/23 0341 08/05/23 0353 08/06/23 0426  HGB 8.9* 9.0* 9.5*  HCT 28.8* 27.7* 29.7*  PLT 282 278 341  HEPARINUNFRC 0.35 0.35 0.31  CREATININE 0.44 0.51 0.71    Estimated Creatinine Clearance: 56.1 mL/min (by C-G formula based on SCr of 0.71 mg/dL).   Assessment: 36 YOF with history of DVT on Eliquis  PTA, last dose on 6/26 at 2100.  Patient presented as a code stroke and thrombectomy was unsuccessful on 7/1.  Pharmacy consulted to dose IV heparin  per stroke protocol.    Heparin  level therapeutic and stable (0.31) on heparin  1200 units/hr CBC stable  Goal of Therapy:  Heparin  level 0.3-0.5 units/ml Monitor platelets by anticoagulation protocol: Yes   Plan:  Continue heparin  infusion at 1200 units/hr Daily heparin  level and CBC  Sharyne Glatter, PharmD, BCCCP Clinical Pharmacist 08/06/2023 8:39 AM

## 2023-08-06 NOTE — Progress Notes (Signed)
 Palliative:  HPI: 78 y.o. 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 M1 occlusion with unsuccessful occlusion. Hospitalization complicated by low grade fever with increased secretions. Also with plans to tap malignant pleural effusion.    Discussed with PCCM. I met today at Debby's bedside with husband and son. We reviewed and no real changes in Debby's care. They are still awaiting to hear back from Debby's brother in [place] if he wishes to visit. They share that daughter is in AL and reports she cannot return until Sunday. Husband has shared that he does not feel it is fair to Debby to keep her like this that long.   We did spend more time today reviewing expectations with one way extubation. I explained that we do not feel that she will do well and be able to maintain on her own - we see how stressful and difficult weaning trials have been and how exhausted and labored she becomes. I explained that my goal would be to get her more comfortable with medication to control labored breathing (I specified medications such as morphine ) to allow for an easier and more peaceful transition off ventilator. I explained that our focus would be on minimizing suffering. Husband points out that she is not comfortable now. I did explain that we can hold on further breathing trials and stop trying to reduce her sedation and focus on allowing her more comfort while we await for family to proceed - no clear direction or answer. Husband reiterates poor experience with hospice and they do not wish to connect with  hospice - I reassured him that I believe that we will likely be caring for Debby here in the hospital for her remaining days. I did share honestly that I do not believe she would likely live very long off ventilator support - likely hours to potentially days but I also would not be surprised if something were to happen very quickly after extubation.   [facility] to [facility] and obtain [facility] to assist with funeral planning as well as caregiver for Mr. Peruski. They have a virtual appt 08/12/23.   All questions/concerns addressed to the best of my ability. Therapeutic listening provided. Emotional support provided.   Exam: On sedation and very labored breathing following SBT/weaning trial this morning. Diaphoretic and warm to touch. Feet cold to touch. Abd soft. + ostomy. Less responsive today.   Plan: - DNR - One way extubation anticipated in the coming days (awaiting if family out of state plan to return for visit)  60 min  Bernarda Kitty, NP Palliative Medicine Team Pager [phone] (Please see [email or web address] for schedule) Team Phone [phone]

## 2023-08-06 NOTE — Progress Notes (Signed)
 NAME:  Rachel Carlson, MRN:  [id number], DOB:  Nov 10, 1945, LOS: 11 ADMISSION DATE:  07/26/2023, CONSULTATION DATE: 07/26/2023 REFERRING MD: Dr. 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 LKW 10:00. Was found sometime after than on the toilet, had falled couldn't get up and had L sided weakness, R gaze, L facial droop.  Imaging in ED w M1 occlusion. In ED was lethargic but able to state that she took eliquis  this morning.   She went for thrombectomy w NIR, unsuccessful revasc.  Remains intubated after the case as CRNA saw possible posturing and there were concerns regarding plausibility of airway protection, PCCM consulted.    Pertinent  Medical History   Past Medical History:  Diagnosis Date   Blood transfusion without reported diagnosis    Chronic right ear pain    normal MRI   Diverticulosis    DVT of leg (deep venous thrombosis) (HCC)    Esophageal stricture    Family history of breast cancer    Family history of colon cancer    Family history of melanoma    Family history of prostate cancer    Fibroids 1998   uterine, history of (left ovaries)   GERD (gastroesophageal reflux disease)    History of hiatal hernia    History of uterine prolapse    Hx of adenomatous polyp of colon 10/19/2014   Hyperlipidemia    Hypertension    borderline no meds diet and exercise   Nausea and vomiting 05/19/2023   Neuromuscular disorder (HCC)    neuropathy from chemo   Obesity    Ovarian cancer (HCC)    Peritoneal carcinomatosis (HCC)    PVC (premature ventricular contraction)    first dx by holter in 1980's, echo (6/10) EF 55-60%, normal diastolic fxn, normal size RV and fxn, mild MR, PASP   Squamous cell  carcinoma of arm    ovarian   Tubal pregnancy    Ulcerative proctosigmoiditis (HCC)    Urge urinary incontinence 06/03/2007   Qualifier: Diagnosis of  By: Cecilie CMA, NANNIE Mitzie Iline Lionel Events: Including procedures, antibiotic start and stop dates in addition to other pertinent events   M1 occlusion, unsuccessful thrombectomy 6/28-on ventilator, weaning well 6/28 MRI-large acute right MCA territory infarct with 4 mm leftward shift, occlusion of right M1 MCA 7/1 increased secretions, UA neg, tmax 100.5, PMT consult, s/p left thoracentesis 1400 out 7/2 copious secretions, increased WOB on full support, tmax 100.9 7/4 discussed GOC, family not really ready to engage  Interim History / Subjective:   No events overnight  Objective    Blood pressure 106/69, pulse 98, temperature 100 F (37.8 C), temperature source Axillary, resp. rate (!) 30, height 5' 2 (1.575 m), weight 71 kg, last menstrual period 05/29/1996, SpO2 95%.    Vent Mode: PRVC FiO2 (%):  [40 %] 40 % Set Rate:  [18 bmp] 18 bmp Vt Set:  [400 mL] 400 mL PEEP:  [5 cmH20] 5 cmH20 Pressure Support:  [8 cmH20] 8 cmH20 Plateau Pressure:  [15 cmH20-18 cmH20] 16 cmH20   Intake/Output Summary (Last 24 hours) at 08/06/2023 0706 Last data filed at 08/06/2023 0008 Gross per 24 hour  Intake 1660.81 ml  Output  1350 ml  Net 310.81 ml   Filed Weights   07/27/23 0532 07/30/23 0600 08/02/23 0530  Weight: 73.5 kg 76.5 kg 71 kg   Examination:  General:  critically ill appearing on mech vent HEENT: MM pink/moist; ETT in place Neuro: sedation turned off; Moving RE to painful stimuli some but weaker on left. Per nurse yesterday was maybe following commands some on right. perrl CV: s1s2, no m/r/g PULM:  dim clear BS bilaterally; on mech vent PRVC GI: soft, left ostomy in place Extremities: warm/dry, no edema    Resolved problem list   Assessment and Plan   M1 occlusion and PCA territory infarcts with  cytotoxic edema with unsuccessful thrombectomy  - CTH 7/2 stable Plan: -stroke following; appreciate recs -frequent neuro checks -limit sedating meds -currently off HTS since 7/1; allowing to gradually decline -prn labetalol ; BP goal <180/105 per neuro -statin -Continue neuroprotective measures- normothermia, euglycemia, HOB greater than 30, head in neutral alignment, normocapnia, normoxia  Acute hypoxemic respiratory failure and ventilator dependence in setting of CVA PNA +/- atelectasis  Plan: -failed sbt due to high RR and ams -LTVV strategy with tidal volumes of 6-8 cc/kg ideal body weight -Wean PEEP/FiO2 for SpO2 >92% -VAP bundle in place -Daily SAT and SBT -PAD protocol in place; (Failed trial of Precedex  with hypotension and bradycardia 7/3) -wean sedation for RASS goal 0 to -1 -completed 5 day course of cefepime  on 7/5 -monitor wbc/fever curve off abx  HTN HLD - echo EF 60-65% Plan: -currently normotensive -prn labetalol  for BP goal <180/105 per neuro -statin  Uterine cancer with peritoneal carcinomatosis Plan: - Supportive care - Holding home niraparib   Malignant pleural effusion - s/p thoracentesis 7/1, 1400 ml off> GS neg, follow culture (ngtd) and cytology, exudative by Lights criteria Plan: -trend cxr -cytology from 7/1 showing metastatic adenocarcinoma  Left gastrocnemius/calf vien DVT on Eliquis  - LE dopplers neg for DVT 6/27 Plan: -cont heparin  per pharmacy  Prediabetes Plan: -increase SSI -cbg monitoring  GOC Plan: - overall very poor prognosis.  Ongoing discussions with family, appreciate PMT assistance. Family meeting on 7-7 just waiting on other family members to come from out of town and will likely transition to one-way extubation.  Best Practice (right click and Reselect all SmartList Selections daily)   Diet/type: tubefeeds DVT prophylaxis systemic heparin  Pressure ulcer(s): N/A GI prophylaxis: PPI Lines: N/A Foley:  N/A Code  Status:  DNR Last date of multidisciplinary goals of care discussion [7/7 spoke with son and husband in person with palliative. Waiting on family members to come from out of town and will likely plan on one-way extubation]   CRITICAL CARE Performed by: Norleen JONETTA Cedar   Total critical care time: 35 minutes   Norleen JONETTA Cedar, PA-C [facility] 08/06/2023, 7:06 AM  See Amion If no response, please call [phone] until 7pm After 7:00 pm call Elink  [phone]

## 2023-08-06 NOTE — Progress Notes (Signed)
 Nutrition Follow-up  DOCUMENTATION CODES:   Not applicable  INTERVENTION:   Continue tube feeding via OGT: Osmolite 1.2 at 55 ml/hr (1320 ml per day) Prosource TF20 60 ml daily   Provides 1664 kcal, 93 gm protein, 1065 ml free water  daily   MVI with minerals daily  Monitor GOC with family members`   NUTRITION DIAGNOSIS:   Inadequate oral intake related to acute illness as evidenced by NPO status. - Still applicable   GOAL:   Patient will meet greater than or equal to 90% of their needs - Meeting via TF's  MONITOR:   TF tolerance, Diet advancement, Labs, I & O's  REASON FOR ASSESSMENT:   Consult, Ventilator Enteral/tube feeding initiation and management  ASSESSMENT:   78 y.o. female with PMH of ovarian cancer with peritoneal carcinomatosis undergoing chemo,s/p colostomy and  preparing for colostomy revision, left pleural effusion s/p thoracentesis, and HLD presented to ED for code stroke, has left sided weakness.  6/28: Admitted to ICU, s/p thrombectomy with unsuccessful revasc remained intubated post procedure; MRI showed large acute MCA infarct w/ 4mm left shift 7/1: thoracentesis removed , cytology showed metastatic adenocarcinoma   Patient is currently intubated on ventilator support. Pt tolerating tube feeds at goal. Ongoing GOC with family, likely will transition to one-way extubation. Waiting on family members to come from out of town.   MV: 11.1 L/min Temp (24hrs), Avg:99.6 F (37.6 C), Min:99.1 F (37.3 C), Max:100 F (37.8 C)  MAP (cuff): 90 mmHg  Propofol : 6.66 ml/hr  Admit weight: 74.2 kg Current weight: 71 kg    Intake/Output Summary (Last 24 hours) at 08/06/2023 0916 Last data filed at 08/06/2023 0008 Gross per 24 hour  Intake 786.28 ml  Output 1350 ml  Net -563.72 ml  UOP 1200 ml x 24 hours   Drains/Lines: Colostomy 150 ml x 24 hours  OGT Catheter   Nutritionally Relevant Medications: Scheduled Meds:  Chlorhexidine  Gluconate  Cloth  6 each Topical Daily   docusate  100 mg Per Tube BID   famotidine   20 mg Per Tube BID   feeding supplement (PROSource TF20)  60 mL Per Tube Daily   free water   100 mL Per Tube Q4H   insulin  aspart  0-20 Units Subcutaneous Q4H   multivitamin with minerals  1 tablet Per Tube Daily   mouth rinse  15 mL Mouth Rinse Q2H   polyethylene glycol  17 g Per Tube Daily   rosuvastatin   20 mg Per Tube QHS   sodium chloride  flush  10-40 mL Intracatheter Q12H   Continuous Infusions:  feeding supplement (OSMOLITE 1.2 CAL) 1,000 mL (08/05/23 2218)   heparin  1,200 Units/hr (08/05/23 2150)   propofol  (DIPRIVAN ) infusion 15 mcg/kg/min (08/06/23 0610)    Labs Reviewed: Albumin  3.1 Total protein 5.4 Vitamin D  24.95 (2024) CBG ranges from 169-200 mg/dL over the last 24 hours HgbA1c 6.3  Diet Order:   Diet Order             Diet NPO time specified  Diet effective now                   EDUCATION NEEDS:   Not appropriate for education at this time  Skin:  Skin Assessment: Reviewed RN Assessment  Last BM:  Colostomy 150 ml x 24 hours  Height:   Ht Readings from Last 1 Encounters:  07/26/23 5' 2 (1.575 m)    Weight:   Wt Readings from Last 1 Encounters:  08/02/23 71 kg  BMI:  Body mass index is 28.63 kg/m.  Estimated Nutritional Needs:   Kcal:  1600-1800 kcal  Protein:  80-100 gm  Fluid:  >1.6L/day   Olivia Kenning, RD Registered Dietitian  See Amion for more information

## 2023-08-06 NOTE — Plan of Care (Signed)
 This patient remains on [facility] as of time of writing. The patient remains intubated and sedated. NIHSS = 23 with limited patient participation with exam. Right chest wall port accessed, saline locked, and exhibiting brisk blood return. Ostomy to LLQ with brown, loose output. The patient continues to receive infusions of Propofol  and Heparin ; IV tubing changed overnight. Enteral feeds via OGT; tubing and bottles changed overnight. Upon change of shift, the patient was tachypneic and over-breathing the vent at ~ 28 BPM, the patient responded well to PRN fentanyl  (given for CPOT = 3) with a decrease in RR to the low 20s. CHG bath completed overnight.   Problem: Education: Goal: Knowledge of disease or condition will improve Outcome: Not Progressing Goal: Knowledge of secondary prevention will improve (MUST DOCUMENT ALL) Outcome: Not Progressing Goal: Knowledge of patient specific risk factors will improve (DELETE if not current risk factor) Outcome: Not Progressing   Problem: Ischemic Stroke/TIA Tissue Perfusion: Goal: Complications of ischemic stroke/TIA will be minimized Outcome: Not Progressing   Problem: Coping: Goal: Will verbalize positive feelings about self Outcome: Not Progressing Goal: Will identify appropriate support needs Outcome: Not Progressing   Problem: Health Behavior/Discharge Planning: Goal: Ability to manage health-related needs will improve Outcome: Not Progressing Goal: Goals will be collaboratively established with patient/family Outcome: Not Progressing   Problem: Self-Care: Goal: Ability to participate in self-care as condition permits will improve Outcome: Not Progressing Goal: Verbalization of feelings and concerns over difficulty with self-care will improve Outcome: Not Progressing Goal: Ability to communicate needs accurately will improve Outcome: Not Progressing   Problem: Nutrition: Goal: Risk of aspiration will decrease Outcome: Not  Progressing Goal: Dietary intake will improve Outcome: Not Progressing   Problem: Education: Goal: Knowledge of General Education information will improve Description: Including pain rating scale, medication(s)/side effects and non-pharmacologic comfort measures Outcome: Not Progressing   Problem: Health Behavior/Discharge Planning: Goal: Ability to manage health-related needs will improve Outcome: Not Progressing   Problem: Clinical Measurements: Goal: Ability to maintain clinical measurements within normal limits will improve Outcome: Not Progressing Goal: Will remain free from infection Outcome: Not Progressing Goal: Diagnostic test results will improve Outcome: Not Progressing Goal: Respiratory complications will improve Outcome: Not Progressing Goal: Cardiovascular complication will be avoided Outcome: Not Progressing   Problem: Activity: Goal: Risk for activity intolerance will decrease Outcome: Not Progressing   Problem: Nutrition: Goal: Adequate nutrition will be maintained Outcome: Not Progressing   Problem: Coping: Goal: Level of anxiety will decrease Outcome: Not Progressing   Problem: Elimination: Goal: Will not experience complications related to bowel motility Outcome: Not Progressing Goal: Will not experience complications related to urinary retention Outcome: Not Progressing   Problem: Pain Managment: Goal: General experience of comfort will improve and/or be controlled Outcome: Not Progressing   Problem: Safety: Goal: Ability to remain free from injury will improve Outcome: Not Progressing   Problem: Skin Integrity: Goal: Risk for impaired skin integrity will decrease Outcome: Not Progressing   Problem: Education: Goal: Understanding of CV disease, CV risk reduction, and recovery process will improve Outcome: Not Progressing Goal: Individualized Educational Video(s) Outcome: Not Progressing   Problem: Activity: Goal: Ability to return to  baseline activity level will improve Outcome: Not Progressing   Problem: Cardiovascular: Goal: Ability to achieve and maintain adequate cardiovascular perfusion will improve Outcome: Not Progressing Goal: Vascular access site(s) Level 0-1 will be maintained Outcome: Not Progressing   Problem: Health Behavior/Discharge Planning: Goal: Ability to safely manage health-related needs after discharge will improve Outcome: Not Progressing  Problem: Activity: Goal: Ability to tolerate increased activity will improve Outcome: Not Progressing   Problem: Respiratory: Goal: Ability to maintain a clear airway and adequate ventilation will improve Outcome: Not Progressing   Problem: Role Relationship: Goal: Method of communication will improve Outcome: Not Progressing   Problem: Education: Goal: Ability to describe self-care measures that may prevent or decrease complications (Diabetes Survival Skills Education) will improve Outcome: Not Progressing Goal: Individualized Educational Video(s) Outcome: Not Progressing   Problem: Coping: Goal: Ability to adjust to condition or change in health will improve Outcome: Not Progressing

## 2023-08-06 NOTE — Plan of Care (Signed)
  Problem: Health Behavior/Discharge Planning: Goal: Goals will be collaboratively established with patient/family Outcome: Progressing   Problem: Nutrition: Goal: Risk of aspiration will decrease Outcome: Progressing Goal: Dietary intake will improve Outcome: Progressing   Problem: Clinical Measurements: Goal: Ability to maintain clinical measurements within normal limits will improve Outcome: Progressing Goal: Respiratory complications will improve Outcome: Progressing

## 2023-08-06 NOTE — Plan of Care (Signed)
 Problem: Education: Goal: Knowledge of disease or condition will improve 08/06/2023 2130 by Teresa Olita RAMAN, RN Outcome: Not Progressing 08/06/2023 2109 by Teresa Olita RAMAN, RN Outcome: Not Progressing Goal: Knowledge of secondary prevention will improve (MUST DOCUMENT ALL) 08/06/2023 2130 by Teresa Olita RAMAN, RN Outcome: Not Progressing 08/06/2023 2109 by Teresa Olita RAMAN, RN Outcome: Not Progressing Goal: Knowledge of patient specific risk factors will improve (DELETE if not current risk factor) 08/06/2023 2130 by Teresa Olita RAMAN, RN Outcome: Not Progressing 08/06/2023 2109 by Teresa Olita RAMAN, RN Outcome: Not Progressing   Problem: Ischemic Stroke/TIA Tissue Perfusion: Goal: Complications of ischemic stroke/TIA will be minimized 08/06/2023 2130 by Teresa Olita RAMAN, RN Outcome: Not Progressing 08/06/2023 2109 by Teresa Olita RAMAN, RN Outcome: Not Progressing   Problem: Coping: Goal: Will verbalize positive feelings about self 08/06/2023 2130 by Teresa Olita RAMAN, RN Outcome: Not Progressing 08/06/2023 2109 by Teresa Olita RAMAN, RN Outcome: Not Progressing Goal: Will identify appropriate support needs 08/06/2023 2130 by Teresa Olita RAMAN, RN Outcome: Not Progressing 08/06/2023 2109 by Teresa Olita RAMAN, RN Outcome: Not Progressing   Problem: Health Behavior/Discharge Planning: Goal: Ability to manage health-related needs will improve 08/06/2023 2130 by Teresa Olita RAMAN, RN Outcome: Not Progressing 08/06/2023 2109 by Teresa Olita RAMAN, RN Outcome: Not Progressing Goal: Goals will be collaboratively established with patient/family 08/06/2023 2130 by Teresa Olita RAMAN, RN Outcome: Not Progressing 08/06/2023 2109 by Teresa Olita RAMAN, RN Outcome: Not Progressing   Problem: Self-Care: Goal: Ability to participate in self-care as condition permits will improve 08/06/2023 2130 by Teresa Olita RAMAN, RN Outcome: Not Progressing 08/06/2023 2109 by Teresa Olita RAMAN, RN Outcome: Not Progressing Goal: Verbalization of feelings and  concerns over difficulty with self-care will improve 08/06/2023 2130 by Teresa Olita RAMAN, RN Outcome: Not Progressing 08/06/2023 2109 by Teresa Olita RAMAN, RN Outcome: Not Progressing Goal: Ability to communicate needs accurately will improve 08/06/2023 2130 by Teresa Olita RAMAN, RN Outcome: Not Progressing 08/06/2023 2109 by Teresa Olita RAMAN, RN Outcome: Not Progressing   Problem: Nutrition: Goal: Risk of aspiration will decrease 08/06/2023 2130 by Teresa Olita RAMAN, RN Outcome: Not Progressing 08/06/2023 2109 by Teresa Olita RAMAN, RN Outcome: Not Progressing Goal: Dietary intake will improve 08/06/2023 2130 by Teresa Olita RAMAN, RN Outcome: Not Progressing 08/06/2023 2109 by Teresa Olita RAMAN, RN Outcome: Not Progressing   Problem: Education: Goal: Knowledge of General Education information will improve Description: Including pain rating scale, medication(s)/side effects and non-pharmacologic comfort measures 08/06/2023 2130 by Teresa Olita RAMAN, RN Outcome: Not Progressing 08/06/2023 2109 by Teresa Olita RAMAN, RN Outcome: Not Progressing   Problem: Health Behavior/Discharge Planning: Goal: Ability to manage health-related needs will improve 08/06/2023 2130 by Teresa Olita RAMAN, RN Outcome: Not Progressing 08/06/2023 2109 by Teresa Olita RAMAN, RN Outcome: Not Progressing   Problem: Clinical Measurements: Goal: Ability to maintain clinical measurements within normal limits will improve 08/06/2023 2130 by Teresa Olita RAMAN, RN Outcome: Not Progressing 08/06/2023 2109 by Teresa Olita RAMAN, RN Outcome: Not Progressing Goal: Will remain free from infection 08/06/2023 2130 by Teresa Olita RAMAN, RN Outcome: Not Progressing 08/06/2023 2109 by Teresa Olita RAMAN, RN Outcome: Not Progressing Goal: Diagnostic test results will improve 08/06/2023 2130 by Teresa Olita RAMAN, RN Outcome: Not Progressing 08/06/2023 2109 by Teresa Olita RAMAN, RN Outcome: Not Progressing Goal: Respiratory complications will improve 08/06/2023 2130 by Teresa Olita RAMAN, RN Outcome: Not Progressing 08/06/2023 2109 by Teresa Olita RAMAN, RN Outcome: Not Progressing Goal: Cardiovascular complication will be avoided 08/06/2023 2130 by  Teresa Olita RAMAN, RN Outcome: Not Progressing 08/06/2023 2109 by Teresa Olita RAMAN, RN Outcome: Not Progressing   Problem: Activity: Goal: Risk for activity intolerance will decrease 08/06/2023 2130 by Teresa Olita RAMAN, RN Outcome: Not Progressing 08/06/2023 2109 by Teresa Olita RAMAN, RN Outcome: Not Progressing   Problem: Nutrition: Goal: Adequate nutrition will be maintained 08/06/2023 2130 by Teresa Olita RAMAN, RN Outcome: Not Progressing 08/06/2023 2109 by Teresa Olita RAMAN, RN Outcome: Not Progressing   Problem: Coping: Goal: Level of anxiety will decrease 08/06/2023 2130 by Teresa Olita RAMAN, RN Outcome: Not Progressing 08/06/2023 2109 by Teresa Olita RAMAN, RN Outcome: Not Progressing   Problem: Elimination: Goal: Will not experience complications related to bowel motility 08/06/2023 2130 by Teresa Olita RAMAN, RN Outcome: Not Progressing 08/06/2023 2109 by Teresa Olita RAMAN, RN Outcome: Not Progressing Goal: Will not experience complications related to urinary retention 08/06/2023 2130 by Teresa Olita RAMAN, RN Outcome: Not Progressing 08/06/2023 2109 by Teresa Olita RAMAN, RN Outcome: Not Progressing   Problem: Pain Managment: Goal: General experience of comfort will improve and/or be controlled 08/06/2023 2130 by Teresa Olita RAMAN, RN Outcome: Not Progressing 08/06/2023 2109 by Teresa Olita RAMAN, RN Outcome: Not Progressing   Problem: Safety: Goal: Ability to remain free from injury will improve 08/06/2023 2130 by Teresa Olita RAMAN, RN Outcome: Not Progressing 08/06/2023 2109 by Teresa Olita RAMAN, RN Outcome: Not Progressing   Problem: Skin Integrity: Goal: Risk for impaired skin integrity will decrease 08/06/2023 2130 by Teresa Olita RAMAN, RN Outcome: Not Progressing 08/06/2023 2109 by Teresa Olita RAMAN, RN Outcome: Not Progressing   Problem:  Education: Goal: Understanding of CV disease, CV risk reduction, and recovery process will improve 08/06/2023 2130 by Teresa Olita RAMAN, RN Outcome: Not Progressing 08/06/2023 2109 by Teresa Olita RAMAN, RN Outcome: Not Progressing Goal: Individualized Educational Video(s) 08/06/2023 2130 by Teresa Olita RAMAN, RN Outcome: Not Progressing 08/06/2023 2109 by Teresa Olita RAMAN, RN Outcome: Not Progressing   Problem: Activity: Goal: Ability to return to baseline activity level will improve 08/06/2023 2130 by Teresa Olita RAMAN, RN Outcome: Not Progressing 08/06/2023 2109 by Teresa Olita RAMAN, RN Outcome: Not Progressing   Problem: Cardiovascular: Goal: Ability to achieve and maintain adequate cardiovascular perfusion will improve 08/06/2023 2130 by Teresa Olita RAMAN, RN Outcome: Not Progressing 08/06/2023 2109 by Teresa Olita RAMAN, RN Outcome: Not Progressing Goal: Vascular access site(s) Level 0-1 will be maintained 08/06/2023 2130 by Teresa Olita RAMAN, RN Outcome: Not Progressing 08/06/2023 2109 by Teresa Olita RAMAN, RN Outcome: Not Progressing   Problem: Health Behavior/Discharge Planning: Goal: Ability to safely manage health-related needs after discharge will improve 08/06/2023 2130 by Teresa Olita RAMAN, RN Outcome: Not Progressing 08/06/2023 2109 by Teresa Olita RAMAN, RN Outcome: Not Progressing   Problem: Activity: Goal: Ability to tolerate increased activity will improve 08/06/2023 2130 by Teresa Olita RAMAN, RN Outcome: Not Progressing 08/06/2023 2109 by Teresa Olita RAMAN, RN Outcome: Not Progressing   Problem: Respiratory: Goal: Ability to maintain a clear airway and adequate ventilation will improve 08/06/2023 2130 by Teresa Olita RAMAN, RN Outcome: Not Progressing 08/06/2023 2109 by Teresa Olita RAMAN, RN Outcome: Not Progressing   Problem: Role Relationship: Goal: Method of communication will improve 08/06/2023 2130 by Teresa Olita RAMAN, RN Outcome: Not Progressing 08/06/2023 2109 by Teresa Olita RAMAN, RN Outcome: Not  Progressing   Problem: Education: Goal: Ability to describe self-care measures that may prevent or decrease complications (Diabetes Survival Skills Education) will improve 08/06/2023 2130 by Teresa Olita RAMAN, RN Outcome: Not Progressing  08/06/2023 2109 by Teresa Olita RAMAN, RN Outcome: Not Progressing Goal: Individualized Educational Video(s) 08/06/2023 2130 by Teresa Olita RAMAN, RN Outcome: Not Progressing 08/06/2023 2109 by Teresa Olita RAMAN, RN Outcome: Not Progressing   Problem: Coping: Goal: Ability to adjust to condition or change in health will improve 08/06/2023 2130 by Teresa Olita RAMAN, RN Outcome: Not Progressing 08/06/2023 2109 by Teresa Olita RAMAN, RN Outcome: Not Progressing

## 2023-08-07 ENCOUNTER — Inpatient Hospital Stay: Admitting: Hematology

## 2023-08-07 ENCOUNTER — Inpatient Hospital Stay (HOSPITAL_COMMUNITY)

## 2023-08-07 DIAGNOSIS — I63511 Cerebral infarction due to unspecified occlusion or stenosis of right middle cerebral artery: Secondary | ICD-10-CM | POA: Diagnosis not present

## 2023-08-07 DIAGNOSIS — Z7189 Other specified counseling: Secondary | ICD-10-CM | POA: Diagnosis not present

## 2023-08-07 DIAGNOSIS — J910 Malignant pleural effusion: Secondary | ICD-10-CM | POA: Diagnosis not present

## 2023-08-07 DIAGNOSIS — I10 Essential (primary) hypertension: Secondary | ICD-10-CM | POA: Diagnosis not present

## 2023-08-07 DIAGNOSIS — I69391 Dysphagia following cerebral infarction: Secondary | ICD-10-CM | POA: Diagnosis not present

## 2023-08-07 DIAGNOSIS — C786 Secondary malignant neoplasm of retroperitoneum and peritoneum: Secondary | ICD-10-CM | POA: Diagnosis not present

## 2023-08-07 DIAGNOSIS — I63541 Cerebral infarction due to unspecified occlusion or stenosis of right cerebellar artery: Secondary | ICD-10-CM | POA: Diagnosis not present

## 2023-08-07 DIAGNOSIS — Z515 Encounter for palliative care: Secondary | ICD-10-CM | POA: Diagnosis not present

## 2023-08-07 DIAGNOSIS — I63411 Cerebral infarction due to embolism of right middle cerebral artery: Secondary | ICD-10-CM | POA: Diagnosis not present

## 2023-08-07 DIAGNOSIS — J9601 Acute respiratory failure with hypoxia: Secondary | ICD-10-CM | POA: Diagnosis not present

## 2023-08-07 LAB — CBC
HCT: 29.5 % — ABNORMAL LOW (ref 36.0–46.0)
Hemoglobin: 9.3 g/dL — ABNORMAL LOW (ref 12.0–15.0)
MCH: 31.8 pg (ref 26.0–34.0)
MCHC: 31.5 g/dL (ref 30.0–36.0)
MCV: 101 fL — ABNORMAL HIGH (ref 80.0–100.0)
Platelets: 352 K/uL (ref 150–400)
RBC: 2.92 MIL/uL — ABNORMAL LOW (ref 3.87–5.11)
RDW: 15.9 % — ABNORMAL HIGH (ref 11.5–15.5)
WBC: 9.1 K/uL (ref 4.0–10.5)
nRBC: 0 % (ref 0.0–0.2)

## 2023-08-07 LAB — BASIC METABOLIC PANEL WITH GFR
Anion gap: 9 (ref 5–15)
BUN: 24 mg/dL — ABNORMAL HIGH (ref 8–23)
CO2: 28 mmol/L (ref 22–32)
Calcium: 9.8 mg/dL (ref 8.9–10.3)
Chloride: 99 mmol/L (ref 98–111)
Creatinine, Ser: 0.53 mg/dL (ref 0.44–1.00)
GFR, Estimated: >60 mL/min (ref >60)
Glucose, Bld: 193 mg/dL — ABNORMAL HIGH (ref 70–99)
Potassium: 4.3 mmol/L (ref 3.5–5.1)
Sodium: 136 mmol/L (ref 135–145)

## 2023-08-07 LAB — GLUCOSE, CAPILLARY
Glucose-Capillary: 176 mg/dL — ABNORMAL HIGH (ref 70–99)
Glucose-Capillary: 196 mg/dL — ABNORMAL HIGH (ref 70–99)
Glucose-Capillary: 168 mg/dL — ABNORMAL HIGH (ref 70–99)
Glucose-Capillary: 173 mg/dL — ABNORMAL HIGH (ref 70–99)
Glucose-Capillary: 151 mg/dL — ABNORMAL HIGH (ref 70–99)

## 2023-08-07 LAB — HEPARIN LEVEL (UNFRACTIONATED): Heparin Unfractionated: 0.38 [IU]/mL (ref 0.30–0.70)

## 2023-08-07 MED ORDER — GLYCOPYRROLATE 0.2 MG/ML IJ SOLN
0.4000 mg | INTRAMUSCULAR | Status: DC
  Administered 2023-08-07 – 2023-08-08 (×6): 0.4 mg via INTRAVENOUS
  Filled 2023-08-07 (×6): qty 2

## 2023-08-07 MED ORDER — FENTANYL BOLUS VIA INFUSION
25.0000 ug | INTRAVENOUS | Status: DC | PRN
  Administered 2023-08-08: 50 ug via INTRAVENOUS

## 2023-08-07 MED ORDER — FENTANYL 2500MCG IN NS 250ML (10MCG/ML) PREMIX INFUSION
0.0000 ug/h | INTRAVENOUS | Status: DC
  Administered 2023-08-07: 25 ug/h via INTRAVENOUS
  Filled 2023-08-07: qty 250

## 2023-08-07 NOTE — Progress Notes (Addendum)
 STROKE TEAM PROGRESS NOTE   SIGNIFICANT HOSPITAL EVENTS 6/27- Admitted to ICU, intubated   INTERIM HISTORY/SUBJECTIVE No family at the bedside.  Patient is still on heparin  drip. Remains intubated on no sedation.  Looks slightly uncomfortable with her breathing, remains lethargic today, localizes with right arm and following commands intermittently.  Labs are stable.  Vitals are stable, she is slightly tachycardic intermittently.  Patient is on PS/CPAP trial at bedside this morning but per bedside RN, she tends to tire easily and require further mechanical ventilator support.   CBC    Component Value Date/Time   WBC 9.1 08/07/2023 0421   RBC 2.92 (L) 08/07/2023 0421   HGB 9.3 (L) 08/07/2023 0421   HGB 11.0 (L) 04/26/2023 1043   HCT 29.5 (L) 08/07/2023 0421   PLT 352 08/07/2023 0421   PLT 199 04/26/2023 1043   MCV 101.0 (H) 08/07/2023 0421   MCH 31.8 08/07/2023 0421   MCHC 31.5 08/07/2023 0421   RDW 15.9 (H) 08/07/2023 0421   LYMPHSABS 0.2 (L) 07/26/2023 1134   MONOABS 0.1 07/26/2023 1134   EOSABS 0.0 07/26/2023 1134   BASOSABS 0.0 07/26/2023 1134   BMET    Component Value Date/Time   NA 136 08/07/2023 0421   K 4.3 08/07/2023 0421   CL 99 08/07/2023 0421   CO2 28 08/07/2023 0421   GLUCOSE 193 (H) 08/07/2023 0421   BUN 24 (H) 08/07/2023 0421   CREATININE 0.53 08/07/2023 0421   CREATININE 0.64 04/26/2023 1043   CALCIUM  9.8 08/07/2023 0421   GFRNONAA >60 08/07/2023 0421   GFRNONAA >60 04/26/2023 1043   Lab Results  Component Value Date   HGBA1C 6.3 07/23/2023   Lab Results  Component Value Date   CHOL 136 07/27/2023   HDL 51 07/27/2023   LDLCALC 61 07/27/2023   LDLDIRECT 257.3 06/04/2008   TRIG 151 (H) 08/05/2023   CHOLHDL 2.7 07/27/2023   IMAGING past 24 hours DG CHEST PORT 1 VIEW Result Date: 08/07/2023 CLINICAL DATA:  Ventilator dependence. EXAM: PORTABLE CHEST 1 VIEW COMPARISON:  07/30/2023 FINDINGS: Endotracheal tube tip is 4.3 cm above the base of the  carina. The NG tube passes into the stomach although the distal tip position is not included on the film. Right Port-A-Cath remains in place. The cardio pericardial silhouette is enlarged. Bibasilar collapse/consolidation again noted, left greater than right with small bilateral pleural effusions. Telemetry leads overlie the chest. IMPRESSION: 1. Endotracheal tube tip is 4.3 cm above the base of the carina. 2. Bibasilar collapse/consolidation, left greater than right with small bilateral pleural effusions. Electronically Signed   By: Camellia Candle M.D.   On: 08/07/2023 07:22   Vitals:   08/07/23 1100 08/07/23 1200 08/07/23 1300 08/07/23 1400  BP: 104/61 121/85 130/76 115/66  Pulse: 90 (!) 114 (!) 112 (!) 104  Resp: (!) 22 (!) 27 (!) 26 (!) 23  Temp:  99.6 F (37.6 C)    TempSrc:  Axillary    SpO2: 96% 96% 93% 94%  Weight:      Height:       PHYSICAL EXAM General: Intubated well-nourished, well-developed elderly Caucasian lady in no acute distress. Appears critically ill.  Cardiovascular -irregular rate and rhythm with frequent PVC and PACs Respiratory: PS/CPAP on ventilator this morning, weaning trial.    NEURO:  Mental Status: Patient is intubated following commands intermittently.  Pupils are equal round and sluggishly reactive to light with pupils disconjugate and right downgaze, corneal reflex present bilaterally, doll's eyes reflex present, cough  reflex is intact, blinks to threat on the right but not on the left, will grimace to noxious stimuli, patient is localizing on right arm and right leg with some spontaneous movement, left arm no response to pain, left leg withdraws to pain  ASSESSMENT/PLAN  [name] is a 78 y.o. 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  ASPECTS of 7. No intracranial hemorrhage. CTA head & neck - Occlusion of the proximal/mid M1 segment right MCA with intraluminal filling defect concerning for thrombus. Few diminutive caliber M2 and M3 branches noted likely filled via collaterals. CT perfusion Core 62 Penumbra 132  S/p IR but unsuccessful with TICI 0 MRI Large acute right MCA territory infarct with 4 mm of leftward midline shift. No acute hemorrhage. MRA Head- Persistent occlusion of the right M1 MCA. Repeat head CT - Confluent Right hemisphere cytotoxic edema has developed in both the right MCA and Right PCA vascular territories.  6/29 - Repeat CT Head - Confluent Right MCA And PCA territory infarcts. mass effect has mildly progressed including 2 mm of leftward midline shift. 7/1 CT repeat MLS 2-3 mm 2D Echo EF 60 to 65%.  Left atrial size normal.  No right-to-left shunt LDL 61 HgbA1c 6.3 VTE prophylaxis - heparin  IV Eliquis  (apixaban ) daily prior to admission, now on heparin  IV  Therapy recommendations:  Pending Disposition:  Pending, palliative care on board, family plan for one-way extubation tomorrow  Cerebral cytotoxic edema  HTS at 56ml/hr -> 154ml/hr 6/28 250cc bolus 6/29 250cc bolus  Hypertonic saline discontinued  7/1 CT repeat MLS 2-3 mm  Procedural intubation  Hypoxemic respiratory failure Hx of malignant pleural effusion PCCM on board SBT/extubation per CCM CXR: Large left and small right pleural effusions.  7/1: Thoracentesis with 1400cc   Back on propofol  -> off On weaning   Hypertension Stable Blood Pressure Goal: SBP less than 160  Long term BP  goal normotensive  Hyperlipidemia Home meds:  Crestor  20mg , resumed in hospital LDL 61, goal < 70 Continue statin at discharge  Dysphagia Patient has post-stroke dysphagia SLP consulted Now NPO On TF @ 55  Peritoneal carcinomatosis undergoing chemo Follows with Dr. Katragadda at the [facility] Current protocol: niraparib  200 mg daily.  Gyn Onc-  Dr. Viktoria s/p omenectomy small bowel resection, s/p colostomy with colostomy prolapse   Hx left gastrocnemius/calf vein distal DVT Eliquis  5mg  BID PTA No evidence of DVT this admission On heparin  IV  Fever  Tmax 101.3--100.4--100.7--99.2-100.1-100.3-101 WBC 3.8--4.2--6.2--7.9--8.0-9.1 RN reports thick secretions Urinalysis negative Sputum culture growing mixed flora, finished course of cefepime  Chest x-ray shows bilateral pleural effusions and venous congestion  Hospital day # 12  Patient seen by NP with MD, MD to edit note as needed.  Rachel Carlson, AGACNP-BC [facility] Pager: [phone]  ATTENDING NOTE: I reviewed above note and agree with the assessment and plan. Pt was seen and examined.   No family at bedside.  Patient condition no significant change,  still intubated, very lethargic, with repetitive stimulation, able to follow some simple commands, still has left hemiplegia.  Overnight on propofol  due to agitated on weaning.  This morning doing better, without agitation on weaning, just off peripheral.  Palliative care on board, discussed with family, plan for RAM extubation tomorrow.  Continue current management including heparin  IV.  For detailed assessment and plan, please refer to above as I have made changes wherever appropriate.   Rachel Cummins, MD PhD Stroke Neurology 08/07/2023 6:13 PM  This patient is critically ill due to R MCA infarct with R M1 occlusion, DVT on eliquis , respiratory failure, fever and at significant risk of neurological worsening, death form large R MCA stroke,  hemorrhagic conversion, PE, cerebral edema, sepsis. This patient's care requires constant monitoring of vital signs, hemodynamics, respiratory and cardiac monitoring, review of multiple databases, neurological assessment, discussion with family, other specialists and medical decision making of high complexity. I spent 30 minutes of neurocritical care time in the care of this patient

## 2023-08-07 NOTE — Progress Notes (Signed)
 PHARMACY - ANTICOAGULATION CONSULT NOTE  Pharmacy Consult:  Heparin  Indication:  History of DVT, new CVA  Allergies  Allergen Reactions   Aspartame Other (See Comments)    Intolerance to artifical sweeteners, pt states caused burning of bladder   Levaquin  [Levofloxacin ] Nausea Only   Pantoprazole  Sodium Diarrhea    Abdominal pain   Ranitidine Diarrhea and Other (See Comments)    Abdominal pain, also   Omeprazole  Diarrhea and Other (See Comments)    Abdominal pain, also   Sulfasalazine  Rash and Other (See Comments)    Fever, chills, headache, muscle pain also    Patient Measurements: Height: 5' 2 (157.5 cm) Weight: 69.8 kg (153 lb 14.1 oz) IBW/kg (Calculated) : 50.1 HEPARIN  DW (KG): 66  Vital Signs: Temp: 98.7 F (37.1 C) (07/09 0400) Temp Source: Axillary (07/09 0400) BP: 98/66 (07/09 0700) Pulse Rate: 97 (07/09 0700)  Labs: Recent Labs    08/05/23 0353 08/06/23 0426 08/07/23 0421  HGB 9.0* 9.5* 9.3*  HCT 27.7* 29.7* 29.5*  PLT 278 341 352  HEPARINUNFRC 0.35 0.31 0.38  CREATININE 0.51 0.71 0.53    Estimated Creatinine Clearance: 55.6 mL/min (by C-G formula based on SCr of 0.53 mg/dL).   Assessment: 58 YOF with history of DVT on Eliquis  PTA, last dose on 6/26 at 2100.  Patient presented as a code stroke and thrombectomy was unsuccessful on 7/1.  Pharmacy consulted to dose IV heparin  per stroke protocol.    Heparin  level therapeutic and stable (0.38) on heparin  1200 units/hr CBC stable  Goal of Therapy:  Heparin  level 0.3-0.5 units/ml Monitor platelets by anticoagulation protocol: Yes   Plan:  Continue heparin  infusion at 1200 units/hr Daily heparin  level and CBC  Sharyne Glatter, PharmD, BCCCP Clinical Pharmacist 08/07/2023 7:53 AM

## 2023-08-07 NOTE — Progress Notes (Signed)
 Patient with sub-oeLink Physician-Brief Progress Note Patient Name: Rachel Carlson DOB: [birth year] MRN: [id number]   Date of Service  08/07/2023  HPI/Events of Note  Patient with sub-optimal sedation on the ventilator.  eICU Interventions  Fentanyl  gtt added.        Nadiyah Zeis U Welford Christmas 08/07/2023, 1:03 AM

## 2023-08-07 NOTE — TOC Progression Note (Signed)
 Transition of Care [facility]) - Progression Note    Patient Details  Name: MADDEN PIAZZA MRN: [id number] Date of Birth: 08-02-1945  Transition of Care [facility]) CM/SW Contact  Inocente GORMAN Kindle, LCSW Phone Number: 08/07/2023, 10:16 AM  Clinical Narrative:    CSW continuing to follow for needs.    Expected Discharge Plan:  (TBD) Barriers to Discharge: Continued Medical Work up  Expected Discharge Plan and Services In-house Referral: Clinical Social Work     Living arrangements for the past 2 months: Single Family Home                                       Social Determinants of Health (SDOH) Interventions SDOH Screenings   Food Insecurity: Patient Unable To Answer (07/26/2023)  Housing: Unknown (07/01/2023)   Received from [facility]  Transportation Needs: No Transportation Needs (05/16/2023)  Utilities: Not At Risk (05/16/2023)  Alcohol  Screen: Low Risk  (08/28/2022)  Depression (PHQ2-9): Low Risk  (07/23/2023)  Financial Resource Strain: Low Risk  (08/28/2022)  Physical Activity: Inactive (08/28/2022)  Social Connections: Moderately Integrated (05/16/2023)  Stress: No Stress Concern Present (08/28/2022)  Tobacco Use: Low Risk  (08/06/2023)  Health Literacy: Adequate Health Literacy (08/28/2022)    Readmission Risk Interventions    07/29/2023   12:37 PM 06/01/2023    2:31 PM 05/17/2023    1:19 PM  Readmission Risk Prevention Plan  Transportation Screening Complete Complete Complete  PCP or Specialist Appt within 3-5 Days   Complete  HRI or Home Care Consult   Complete  Social Work Consult for Recovery Care Planning/Counseling   Complete  Palliative Care Screening   Not Applicable  Medication Review Oceanographer) Complete Complete Complete  PCP or Specialist appointment within 3-5 days of discharge Complete Complete   HRI or Home Care Consult Complete Complete   SW Recovery Care/Counseling Consult Complete Complete   Palliative Care Screening Not  Applicable Complete   Skilled Nursing Facility Complete Not Applicable

## 2023-08-07 NOTE — Progress Notes (Signed)
 Palliative:  HPI: 78 y.o. 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 M1 occlusion with unsuccessful occlusion. Hospitalization complicated by low grade fever with increased secretions. Also with plans to tap malignant pleural effusion.   I met today again at Rachel Carlson bedside along with husband and son. Rachel Carlson is off sedation but not on weaning mode. She is beginning to become more labored and dyspneic. I suctioned and RN to bedside and ultimately restarted sedation. She calmed much more effectively compared to other days during my visit. Family share that Rachel Carlson brother is coming from [place] and should be here this evening. Daughter cannot return until Sunday but husband reports that he does not feel that it is fair to Rachel Carlson to wait until Sunday. They tell me that they will be prepared for one way extubation tomorrow after her brother has time to visit with her. I explained stopping tube feeding overnight. We discussed that this will not be restarted and Rachel Carlson will not have any pain/discomfort from not having tube feeding - this could actually cause her more discomfort and symptom burden if continued. I also explained plans to maintain fentanyl  infusion during and after extubation to manage labored breathing and ensure her comfort. We have discussed poor prognosis and plan to maintain in hospital - they would not be interested in hospice transition as they have had poor experiences with hospice previously. Given her initial ability to wean well but significant distress after a few hours I do not feel  that she will be able to live long off ventilator support.   All questions/concerns addressed. Therapeutic listening. Emotional support provided. Updated medical team.   Exam: Breathing becoming labored. Moving RUE more. Not awaking to follow commands. RN placed back on sedation and tolerating vent via ETT. Abd soft. + ostomy. Feet cold to touch.   Plan: - DNR - One way extubation to comfort tomorrow - Anticipate hospital death  25 min  Bernarda Kitty, NP Palliative Medicine Team Pager [phone] (Please see [email or web address] for schedule) Team Phone [phone]

## 2023-08-07 NOTE — Progress Notes (Signed)
 NAME:  Rachel Carlson, MRN:  [id number], DOB:  1945/04/26, LOS: 12 ADMISSION DATE:  07/26/2023, CONSULTATION DATE: 07/26/2023 REFERRING MD: Dr. Ray, CHIEF COMPLAINT: Left-sided weakness  History of Present Illness:  78 yo F w PMH prior DVT still on eliquis , ovarian cancer and peritoneal carcinomatosis on niraparib  after several chemo cycles & s/p omenectomy small bowel resection, s/p colostomy c/b colostomy prolapse, who presented to ED 07/26/23 as code stroke. LKW 10:00. Was found sometime after than on the toilet, had falled couldn't get up and had L sided weakness, R gaze, L facial droop.  Imaging in ED w M1 occlusion. In ED was lethargic but able to state that she took eliquis  this morning.   She went for thrombectomy w NIR, unsuccessful revasc.  Remains intubated after the case as CRNA saw possible posturing and there were concerns regarding plausibility of airway protection, PCCM consulted.    Pertinent  Medical History   Past Medical History:  Diagnosis Date   Blood transfusion without reported diagnosis    Chronic right ear pain    normal MRI   Diverticulosis    DVT of leg (deep venous thrombosis) (HCC)    Esophageal stricture    Family history of breast cancer    Family history of colon cancer    Family history of melanoma    Family history of prostate cancer    Fibroids 1998   uterine, history of (left ovaries)   GERD (gastroesophageal reflux disease)    History of hiatal hernia    History of uterine prolapse    Hx of adenomatous polyp of colon 10/19/2014   Hyperlipidemia    Hypertension    borderline no meds diet and exercise   Nausea and vomiting 05/19/2023   Neuromuscular disorder (HCC)    neuropathy from chemo   Obesity    Ovarian cancer (HCC)    Peritoneal carcinomatosis (HCC)    PVC (premature ventricular contraction)    first dx by holter in 1980's, echo (6/10) EF 55-60%, normal diastolic fxn, normal size RV and fxn, mild MR, PASP   Squamous  cell carcinoma of arm    ovarian   Tubal pregnancy    Ulcerative proctosigmoiditis (HCC)    Urge urinary incontinence 06/03/2007   Qualifier: Diagnosis of  By: Cecilie CMA, NANNIE Mitzie Iline Lionel Events: Including procedures, antibiotic start and stop dates in addition to other pertinent events   6/27 M1 occlusion, unsuccessful thrombectomy 6/28-on ventilator, weaning well 6/28 MRI-large acute right MCA territory infarct with 4 mm leftward shift, occlusion of right M1 MCA 7/1 increased secretions, UA neg, tmax 100.5, PMT consult, s/p left thoracentesis 1400 out 7/2 copious secretions, increased WOB on full support, tmax 100.9 7/4 discussed GOC, family not really ready to engage 7/5 No acute issue overnight, remains on vent   Interim History / Subjective:  Lightly sedated on vent  Objective    Blood pressure 98/66, pulse 97, temperature 98.7 F (37.1 C), temperature source Axillary, resp. rate (!) 22, height 5' 2 (1.575 m), weight 69.8 kg, last menstrual period 05/29/1996, SpO2 96%.    Vent Mode: PRVC FiO2 (%):  [40 %] 40 % Set Rate:  [18 bmp] 18 bmp Vt Set:  [400 mL] 400 mL PEEP:  [5 cmH20] 5 cmH20 Plateau Pressure:  [14 cmH20-28 cmH20] 17 cmH20   Intake/Output Summary (Last 24 hours) at 08/07/2023 0734 Last data filed at 08/07/2023 0700 Gross per 24 hour  Intake 2709.53  ml  Output 1930 ml  Net 779.53 ml   Filed Weights   07/30/23 0600 08/02/23 0530 08/07/23 0454  Weight: 76.5 kg 71 kg 69.8 kg   Examination:  General: Acute on chronically ill appearing elderly deconditioned elderly  female lying in bed on mechanical ventilation, in NAD HEENT: ETT, MM pink/moist, PERRL,  Neuro: Sedated but will intermittently follow comnands CV: s1s2 regular rate and rhythm, no murmur, rubs, or gallops,  PULM:  Clear to auscultation, tolerating vent, no increased work of breathing  GI: soft, bowel sounds active in all 4 quadrants, non-tender, non-distended, tolerating  TF Extremities: warm/dry, no edema  Skin: no rashes or lesions  Resolved problem list   Assessment and Plan   M1 occlusion and PCA territory infarcts with cytotoxic edema with unsuccessful thrombectomy  - CTH 7/2 stable -Off HTS since 7/1 P: Primary management per Stroke team  Maintain neuro protective measures Nutrition and bowel regiment  Aspirations precautions  BP goal less thank 180/105 Continue statin  Acute hypoxemic respiratory failure and ventilator dependence in setting of CVA PNA +/- atelectasis  P: Continue ventilator support with lung protective strategies  Wean PEEP and FiO2 for sats greater than 90%. Head of bed elevated 30 degrees. Plateau pressures less than 30 cm H20.  Follow intermittent chest x-ray and ABG.   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  Ongoing discussions with family, appreciate PMT assistance. Family meeting on 7-7 just waiting on other family members to come from out of town and will likely transition to one-way extubation.  Best Practice (right click and Reselect all SmartList Selections daily)   Diet/type: tubefeeds DVT prophylaxis systemic heparin  Pressure ulcer(s):  N/A GI prophylaxis: PPI Lines: N/A Foley:  N/A Code Status:  DNR Last date of multidisciplinary goals of care discussion [7/7 spoke with son and husband in person with palliative. Waiting on family members to come from out of town and will likely plan on one-way extubation]  CRITICAL CARE Performed by: Neale Marzette D. Harris   Total critical care time: 38 minutes  Critical care time was exclusive of separately billable procedures and treating other patients.  Critical care was necessary to treat or prevent imminent or life-threatening deterioration.  Critical care was time spent personally by me on the following activities: development of treatment plan with patient and/or surrogate as well as nursing, discussions with consultants, evaluation of patient's response to treatment, examination of patient, obtaining history from patient or surrogate, ordering and performing treatments and interventions, ordering and review of laboratory studies, ordering and review of radiographic studies, pulse oximetry and re-evaluation of patient's condition.  Denise Bramblett D. Arloa, NP-C [facility] Personal contact information can be found on Amion  If no contact or  response made please call [phone] 08/07/2023, 8:11 AM

## 2023-08-08 DIAGNOSIS — I10 Essential (primary) hypertension: Secondary | ICD-10-CM | POA: Diagnosis not present

## 2023-08-08 DIAGNOSIS — I69391 Dysphagia following cerebral infarction: Secondary | ICD-10-CM | POA: Diagnosis not present

## 2023-08-08 DIAGNOSIS — J9601 Acute respiratory failure with hypoxia: Secondary | ICD-10-CM | POA: Diagnosis not present

## 2023-08-08 DIAGNOSIS — I63541 Cerebral infarction due to unspecified occlusion or stenosis of right cerebellar artery: Secondary | ICD-10-CM | POA: Diagnosis not present

## 2023-08-08 DIAGNOSIS — I63511 Cerebral infarction due to unspecified occlusion or stenosis of right middle cerebral artery: Secondary | ICD-10-CM | POA: Diagnosis not present

## 2023-08-08 DIAGNOSIS — C786 Secondary malignant neoplasm of retroperitoneum and peritoneum: Secondary | ICD-10-CM | POA: Diagnosis not present

## 2023-08-08 DIAGNOSIS — I63411 Cerebral infarction due to embolism of right middle cerebral artery: Secondary | ICD-10-CM | POA: Diagnosis not present

## 2023-08-08 DIAGNOSIS — Z515 Encounter for palliative care: Secondary | ICD-10-CM | POA: Diagnosis not present

## 2023-08-08 DIAGNOSIS — J910 Malignant pleural effusion: Secondary | ICD-10-CM | POA: Diagnosis not present

## 2023-08-08 DIAGNOSIS — Z7189 Other specified counseling: Secondary | ICD-10-CM | POA: Diagnosis not present

## 2023-08-08 LAB — GLUCOSE, CAPILLARY
Glucose-Capillary: 115 mg/dL — ABNORMAL HIGH (ref 70–99)
Glucose-Capillary: 116 mg/dL — ABNORMAL HIGH (ref 70–99)
Glucose-Capillary: 119 mg/dL — ABNORMAL HIGH (ref 70–99)
Glucose-Capillary: 121 mg/dL — ABNORMAL HIGH (ref 70–99)

## 2023-08-08 MED ORDER — ORAL CARE MOUTH RINSE: 15.0000 mL | OROMUCOSAL | Status: DC | PRN

## 2023-08-08 MED ORDER — MORPHINE BOLUS VIA INFUSION: 1.0000 mg | INTRAVENOUS | Status: DC | PRN

## 2023-08-08 MED ORDER — BIOTENE DRY MOUTH MT LIQD: 15.0000 mL | OROMUCOSAL | Status: DC | PRN

## 2023-08-08 MED ORDER — LORAZEPAM 1 MG PO TABS: 1.0000 mg | ORAL_TABLET | ORAL | Status: DC | PRN

## 2023-08-08 MED ORDER — BISACODYL 10 MG RE SUPP: 10.0000 mg | Freq: Every day | RECTAL | Status: DC | PRN

## 2023-08-08 MED ORDER — ONDANSETRON HCL 4 MG/2ML IJ SOLN: 4.0000 mg | Freq: Four times a day (QID) | INTRAMUSCULAR | Status: DC | PRN

## 2023-08-08 MED ORDER — LORAZEPAM 2 MG/ML IJ SOLN
1.0000 mg | INTRAMUSCULAR | Status: DC | PRN
  Filled 2023-08-08: qty 1

## 2023-08-08 MED ORDER — ACETAMINOPHEN 650 MG RE SUPP: 650.0000 mg | Freq: Four times a day (QID) | RECTAL | Status: DC | PRN

## 2023-08-08 MED ORDER — ORAL CARE MOUTH RINSE
15.0000 mL | OROMUCOSAL | Status: DC
  Administered 2023-08-08: 15 mL via OROMUCOSAL

## 2023-08-08 MED ORDER — ACETAMINOPHEN 325 MG PO TABS: 650.0000 mg | ORAL_TABLET | Freq: Four times a day (QID) | ORAL | Status: DC | PRN

## 2023-08-08 MED ORDER — MORPHINE 100MG IN NS 100ML (1MG/ML) PREMIX INFUSION
1.0000 mg/h | INTRAVENOUS | Status: DC
  Administered 2023-08-08: 5 mg/h via INTRAVENOUS
  Filled 2023-08-08: qty 100

## 2023-08-08 MED ORDER — POLYVINYL ALCOHOL 1.4 % OP SOLN
1.0000 [drp] | Freq: Four times a day (QID) | OPHTHALMIC | Status: DC | PRN
Start: 2023-08-08 — End: 2023-08-09

## 2023-08-08 MED ORDER — GLYCOPYRROLATE 0.2 MG/ML IJ SOLN: 0.2000 mg | INTRAMUSCULAR | Status: DC | PRN

## 2023-08-08 MED ORDER — GLYCOPYRROLATE 1 MG PO TABS: 1.0000 mg | ORAL_TABLET | ORAL | Status: DC | PRN

## 2023-08-08 MED ORDER — GLYCOPYRROLATE 0.2 MG/ML IJ SOLN
0.6000 mg | INTRAMUSCULAR | Status: DC
  Administered 2023-08-08 – 2023-08-09 (×2): 0.6 mg via INTRAVENOUS
  Filled 2023-08-08 (×2): qty 3

## 2023-08-08 MED ORDER — HALOPERIDOL LACTATE 5 MG/ML IJ SOLN: 0.5000 mg | INTRAMUSCULAR | Status: DC | PRN

## 2023-08-08 MED ORDER — LORAZEPAM 2 MG/ML IJ SOLN: 1.0000 mg | INTRAMUSCULAR | Status: DC | PRN

## 2023-08-08 MED ORDER — HALOPERIDOL 1 MG PO TABS
0.5000 mg | ORAL_TABLET | ORAL | Status: DC | PRN
Start: 2023-08-08 — End: 2023-08-09

## 2023-08-08 MED ORDER — ONDANSETRON 4 MG PO TBDP: 4.0000 mg | ORAL_TABLET | Freq: Four times a day (QID) | ORAL | Status: DC | PRN

## 2023-08-08 MED ORDER — LORAZEPAM 2 MG/ML PO CONC: 1.0000 mg | ORAL | Status: DC | PRN

## 2023-08-08 MED ORDER — HALOPERIDOL LACTATE 2 MG/ML PO CONC: 0.5000 mg | ORAL | Status: DC | PRN

## 2023-08-08 NOTE — Progress Notes (Signed)
 NAME:  Rachel Carlson, MRN:  [id number], DOB:  September 11, 1945, LOS: 13 ADMISSION DATE:  07/26/2023, CONSULTATION DATE: 07/26/2023 REFERRING MD: Dr. Ray, CHIEF COMPLAINT: Left-sided weakness  History of Present Illness:  78 yo F w PMH prior DVT still on eliquis , ovarian cancer and peritoneal carcinomatosis on niraparib  after several chemo cycles & s/p omenectomy small bowel resection, s/p colostomy c/b colostomy prolapse, who presented to ED 07/26/23 as code stroke. LKW 10:00. Was found sometime after than on the toilet, had falled couldn't get up and had L sided weakness, R gaze, L facial droop.  Imaging in ED w M1 occlusion. In ED was lethargic but able to state that she took eliquis  this morning.   She went for thrombectomy w NIR, unsuccessful revasc.  Remains intubated after the case as CRNA saw possible posturing and there were concerns regarding plausibility of airway protection, PCCM consulted.    Pertinent  Medical History   Past Medical History:  Diagnosis Date   Blood transfusion without reported diagnosis    Chronic right ear pain    normal MRI   Diverticulosis    DVT of leg (deep venous thrombosis) (HCC)    Esophageal stricture    Family history of breast cancer    Family history of colon cancer    Family history of melanoma    Family history of prostate cancer    Fibroids 1998   uterine, history of (left ovaries)   GERD (gastroesophageal reflux disease)    History of hiatal hernia    History of uterine prolapse    Hx of adenomatous polyp of colon 10/19/2014   Hyperlipidemia    Hypertension    borderline no meds diet and exercise   Nausea and vomiting 05/19/2023   Neuromuscular disorder (HCC)    neuropathy from chemo   Obesity    Ovarian cancer (HCC)    Peritoneal carcinomatosis (HCC)    PVC (premature ventricular contraction)    first dx by holter in 1980's, echo (6/10) EF 55-60%, normal diastolic fxn, normal size RV and fxn, mild MR, PASP   Squamous  cell carcinoma of arm    ovarian   Tubal pregnancy    Ulcerative proctosigmoiditis (HCC)    Urge urinary incontinence 06/03/2007   Qualifier: Diagnosis of  By: Cecilie CMA, NANNIE Mitzie Iline Lionel Events: Including procedures, antibiotic start and stop dates in addition to other pertinent events   6/27 M1 occlusion, unsuccessful thrombectomy 6/28-on ventilator, weaning well 6/28 MRI-large acute right MCA territory infarct with 4 mm leftward shift, occlusion of right M1 MCA 7/1 increased secretions, UA neg, tmax 100.5, PMT consult, s/p left thoracentesis 1400 out 7/2 copious secretions, increased WOB on full support, tmax 100.9 7/4 discussed GOC, family not really ready to engage 7/5 No acute issue overnight, remains on vent  7/10 remains comfortable on ventilator, tentative plan for one-way extubation later this morning  Interim History / Subjective:  Lightly sedated on ventilator  Objective    Blood pressure (!) 100/52, pulse 96, temperature 98.2 F (36.8 C), temperature source Axillary, resp. rate 13, height 5' 2 (1.575 m), weight 69.8 kg, last menstrual period 05/29/1996, SpO2 99%.    Vent Mode: PSV;CPAP FiO2 (%):  [40 %] 40 % Set Rate:  [18 bmp] 18 bmp Vt Set:  [400 mL] 400 mL PEEP:  [5 cmH20] 5 cmH20 Pressure Support:  [10 cmH20] 10 cmH20 Plateau Pressure:  [13 cmH20-16 cmH20] 13 cmH20   Intake/Output  Summary (Last 24 hours) at 08/08/2023 0943 Last data filed at 08/08/2023 9095 Gross per 24 hour  Intake 1196.26 ml  Output 750 ml  Net 446.26 ml   Filed Weights   07/30/23 0600 08/02/23 0530 08/07/23 0454  Weight: 76.5 kg 71 kg 69.8 kg   Examination:  General: Acute on chronic ill-appearing deconditioned elderly female lying in bed on mechanical ventilation no acute distress HEENT: ETT, MM pink/moist, PERRL,  Neuro: Lightly sedated on ventilator CV: s1s2 regular rate and rhythm, no murmur, rubs, or gallops,  PULM: Clear to auscultation bilaterally,  no increased work of breathing, no breath sounds, tolerating ventilator GI: soft, bowel sounds active in all 4 quadrants, non-tender, non-distended, tolerating TF Extremities: warm/dry, no edema  Skin: no rashes or lesions  Resolved problem list   Assessment and Plan   M1 occlusion and PCA territory infarcts with cytotoxic edema with unsuccessful thrombectomy  - CTH 7/2 stable -Off HTS since 7/1 P: Primary management per stroke team Maintain neuroprotective measures Nutrition and bowel regiment Aspiration precautions Continue statin  Acute hypoxemic respiratory failure and ventilator dependence in setting of CVA PNA +/- atelectasis  P: Tentative plan for one-way extubation later this morning Continue ventilator support with lung protective strategies  Wean PEEP and FiO2 for sats greater than 90%. Head of bed elevated 30 degrees. Plateau pressures less than 30 cm H20.  Follow intermittent chest x-ray and ABG.   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  Tentative plan for one-way extubation later this morning with eventual transition to comfort care  Best Practice (right click and Reselect all SmartList Selections daily)   Diet/type: tubefeeds DVT prophylaxis  systemic heparin  Pressure ulcer(s): N/A GI prophylaxis: PPI Lines: N/A Foley:  N/A Code Status:  DNR Last date of multidisciplinary goals of care discussion [7/7 spoke with son and husband in person with palliative. Waiting on family members to come from out of town and will likely plan on one-way extubation]  CRITICAL CARE Performed by: Narelle Schoening D. Harris   Total critical care time: 37 minutes  Critical care time was exclusive of separately billable procedures and treating other patients.  Critical care was necessary to treat or prevent imminent or life-threatening deterioration.  Critical care was time spent personally by me on the following activities: development of treatment plan with patient and/or surrogate as well as nursing, discussions with consultants, evaluation of patient's response to treatment, examination of patient, obtaining history from patient or surrogate, ordering and performing treatments and interventions, ordering and review of laboratory studies, ordering and review of radiographic studies, pulse oximetry and re-evaluation of patient's condition.  Merilyn Pagan D. Harris, NP-C  [facility] Personal contact information can be found on Amion  If no contact or response made please call [phone] 08/08/2023, 9:43 AM

## 2023-08-08 NOTE — Progress Notes (Signed)
 Upon start of shift, patient still on fentanyl  gtt from when intubated. CCM made aware, order switched to a continuous morphine  gtt. Patient resting well at this time, breathing around 10 to 15 times per minute, she appears to be comfortable. Her mouth and throat was suctioned and scheduled robinul  was given. 5C made aware of continuous medication change and will transfer to [facility] shortly.

## 2023-08-08 NOTE — Progress Notes (Signed)
 Nutrition Brief Note  Chart reviewed. Per Palliative care plan to transition to comfort care 7/10. Nutrition support d/c'ed 7/9. No further nutrition interventions planned at this time.  Please re-consult as needed.   Powell SQUIBB., RD, LDN, CNSC See AMiON for contact information

## 2023-08-08 NOTE — Progress Notes (Signed)
 STROKE TEAM PROGRESS NOTE   SIGNIFICANT HOSPITAL EVENTS 6/27- Admitted to ICU, intubated  7/10- Failed weaning attempts, planning for one-way extubation today  INTERIM HISTORY/SUBJECTIVE Patient's brother is at bedside this morning.  Patient remains intubated, on fentanyl  drip.  Plans for one-way extubation this afternoon when family arrives.  Remains lethargic today, localizes with right arm but no longer following commands.  She is less alert than yesterday. Labs are stable.  Vitals are stable, she remains slightly tachycardic intermittently.   Patient remains unable to tolerate ventilator weaning.   CBC    Component Value Date/Time   WBC 9.1 08/07/2023 0421   RBC 2.92 (L) 08/07/2023 0421   HGB 9.3 (L) 08/07/2023 0421   HGB 11.0 (L) 04/26/2023 1043   HCT 29.5 (L) 08/07/2023 0421   PLT 352 08/07/2023 0421   PLT 199 04/26/2023 1043   MCV 101.0 (H) 08/07/2023 0421   MCH 31.8 08/07/2023 0421   MCHC 31.5 08/07/2023 0421   RDW 15.9 (H) 08/07/2023 0421   LYMPHSABS 0.2 (L) 07/26/2023 1134   MONOABS 0.1 07/26/2023 1134   EOSABS 0.0 07/26/2023 1134   BASOSABS 0.0 07/26/2023 1134   BMET    Component Value Date/Time   NA 136 08/07/2023 0421   K 4.3 08/07/2023 0421   CL 99 08/07/2023 0421   CO2 28 08/07/2023 0421   GLUCOSE 193 (H) 08/07/2023 0421   BUN 24 (H) 08/07/2023 0421   CREATININE 0.53 08/07/2023 0421   CREATININE 0.64 04/26/2023 1043   CALCIUM  9.8 08/07/2023 0421   GFRNONAA >60 08/07/2023 0421   GFRNONAA >60 04/26/2023 1043   Lab Results  Component Value Date   HGBA1C 6.3 07/23/2023   Lab Results  Component Value Date   CHOL 136 07/27/2023   HDL 51 07/27/2023   LDLCALC 61 07/27/2023   LDLDIRECT 257.3 06/04/2008   TRIG 151 (H) 08/05/2023   CHOLHDL 2.7 07/27/2023   IMAGING past 24 hours No results found.  Vitals:   08/08/23 1000 08/08/23 1100 08/08/23 1200 08/08/23 1203  BP: 95/64 107/66 102/60   Pulse: 97 94 93   Resp: 18 14 18    Temp:    99.1 F (37.3  C)  TempSrc:    Axillary  SpO2: 98% 98% 98%   Weight:      Height:       PHYSICAL EXAM General: Intubated well-nourished, well-developed elderly Caucasian lady in no acute distress. Appears critically ill.  Cardiovascular -irregular rate and rhythm with frequent PVC and PACs Respiratory: PS/CPAP on ventilator this morning, weaning trial.    NEURO:  Mental Status: Patient is intubated but not following commands today.  Pupils are equal round and sluggishly reactive to light with pupils disconjugate and right downgaze, corneal reflex present bilaterally, doll's eyes reflex present, cough reflex is intact, blinks to threat on the right but not on the left, patient is withdrawing on right arm and right leg, left arm flaccid, left leg withdraws to pain  ASSESSMENT/PLAN  Ms. AROUSH CHASSE is a 78 y.o. 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 SABRA  NIH on Admission 20  Stroke: large right MCA and tiny R cerebellar infarcts with R M1 occlusion s/p unsuccessful IR with TICI 0, etiology concerning for hypercoaguable state  in the setting of advanced malignancy  Code Stroke CT head- Acute right MCA infarct.  ASPECTS of 7. No intracranial hemorrhage. CTA head & neck - Occlusion of the proximal/mid M1 segment right MCA with intraluminal filling defect concerning for thrombus. Few diminutive caliber M2 and M3 branches noted likely filled via collaterals. CT perfusion Core 62 Penumbra 132  S/p IR but unsuccessful with TICI 0 MRI Large acute right MCA territory infarct with 4 mm of leftward midline shift. No acute hemorrhage. MRA Head- Persistent occlusion of the right M1 MCA. Repeat head CT - Confluent  Right hemisphere cytotoxic edema has developed in both the right MCA and Right PCA vascular territories.  6/29 - Repeat CT Head - Confluent Right MCA And PCA territory infarcts. mass effect has mildly progressed including 2 mm of leftward midline shift. 7/1 CT repeat MLS 2-3 mm 2D Echo EF 60 to 65%.  Left atrial size normal.  No right-to-left shunt LDL 61 HgbA1c 6.3 VTE prophylaxis - heparin  IV Eliquis  (apixaban ) daily prior to admission, now on heparin  IV, will be discontinued with extubation for comfort care Therapy recommendations:  None, one-way extubation planned for today Disposition: Planning for one-way extubation today  Cerebral cytotoxic edema  HTS at 4ml/hr -> 121ml/hr 6/28 250cc bolus 6/29 250cc bolus  Hypertonic saline discontinued  7/1 CT repeat MLS 2-3 mm  Procedural intubation  Hypoxemic respiratory failure Hx of malignant pleural effusion PCCM on board CXR: Large left and small right pleural effusions.  7/1: Thoracentesis with 1400cc   Back on propofol  -> off On Fentanyl  drip Unable to tolerate weaning from ventilator, planning for one way extubation today  Hypertension Stable Blood Pressure Goal: SBP less than 160  Long term BP goal normotensive  Hyperlipidemia Home meds:  Crestor  20mg , resumed in hospital LDL 61, goal < 70  Dysphagia Patient has post-stroke dysphagia SLP consulted Now NPO  Peritoneal carcinomatosis undergoing chemo Follows with Dr. Katragadda at the [facility] Current protocol: niraparib  200 mg daily.  Gyn Onc-  Dr. Viktoria s/p omenectomy small bowel resection, s/p colostomy with colostomy prolapse   Hx left gastrocnemius/calf vein distal DVT Eliquis  5mg  BID PTA No evidence of DVT this admission On heparin  IV, will d/c once extubated for comfort   Fever  Tmax 101.3--100.4--100.7--99.2-100.1-100.3-101-99.8 WBC 3.8--4.2--6.2--7.9--8.0-9.1 Urinalysis negative Sputum culture growing mixed flora, finished course of  cefepime  Chest x-ray shows bilateral pleural effusions and venous congestion  Goals of Care Palliative care on board Family understands that patient is unlikely to return to baseline Family decision for one-way extubation today   Hospital day # 13  Patient seen by NP with MD, MD to edit note as needed.  Stevi Toberman, AGACNP-BC [facility] Pager: [phone]  ATTENDING NOTE: I reviewed above note and agree with the assessment and plan. Pt was seen and examined.   Brother is at the bedside. Pt more lethargic than yesterday, did not open eyes on voice, she is back on fentanyl  and ventilation. Did not follow commands today. Plan for one-way extubation today for comfort care. I had long discussion with brother at bedside, updated pt current condition, treatment plan and potential prognosis, and answered all the questions. He expressed understanding and appreciation.   For detailed assessment and plan, please refer to above as I have made changes wherever appropriate.   Ary Cummins, MD PhD Stroke Neurology 08/08/2023 5:11 PM

## 2023-08-08 NOTE — Progress Notes (Signed)
 Palliative:  HPI: 78 y.o. 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 M1 occlusion with unsuccessful occlusion. Hospitalization complicated by low grade fever with increased secretions. Also with plans to tap malignant pleural effusion.   I met today at Debby's bedside after discussion with RN. Debby's brother, husband, and son are at bedside. I have spoken extensively over past days with husband and son and they have no specific questions at this time. Debby's brother has had a chance to speak with Dr. Jude, Dr. Jerri and RN has answered many questions for him. They all agree with proceeding with one way extubation to comfort today. They would like to take a little more time to visit with Debby before we extubate. I will return to assist with transition later today.  Update: Returned to bedside. Present with family, RN, RT during extubation process. Discussed signs of discomfort/distress. Encouraged family to let us  know if they notice anything and we will assess for comfort and needs. Debby is resting comfortable - with cough and ongoing secretions at times - will increase Robinul  and RN to give bolus fentanyl  to provide some relief if ongoing.   All questions/concerns addressed. Emotional support provided.   Exam: Resting on vent. No distress. Some eye opening and movement of right extremities at times. Abd soft. + ostomy. Feet cold to touch R > L.   Plan: - DNR - One way extubation to comfort care - Anticipate hospital death  45 min  Bernarda Kitty, NP Palliative Medicine Team Pager  [phone] (Please see [email or web address] for schedule) Team Phone [phone]

## 2023-08-08 NOTE — Plan of Care (Signed)
 Extubated at 1530 to comfort care. Resting comfortably post extubation on 50 mcg/hr of fentanyl . No boluses required. Patient experiencing irregular respirations but no signs of dyspnea or discomfort. Family educated on signs and symptoms of discomfort and encouraged to report any new discomfort to RN.   Problem: Education: Goal: Knowledge of disease or condition will improve Outcome: Completed/Met Goal: Knowledge of secondary prevention will improve (MUST DOCUMENT ALL) Outcome: Completed/Met Goal: Knowledge of patient specific risk factors will improve (DELETE if not current risk factor) Outcome: Completed/Met   Problem: Ischemic Stroke/TIA Tissue Perfusion: Goal: Complications of ischemic stroke/TIA will be minimized Outcome: Completed/Met   Problem: Coping: Goal: Will verbalize positive feelings about self Outcome: Completed/Met Goal: Will identify appropriate support needs Outcome: Completed/Met   Problem: Health Behavior/Discharge Planning: Goal: Ability to manage health-related needs will improve Outcome: Completed/Met Goal: Goals will be collaboratively established with patient/family Outcome: Completed/Met   Problem: Self-Care: Goal: Ability to participate in self-care as condition permits will improve Outcome: Completed/Met Goal: Verbalization of feelings and concerns over difficulty with self-care will improve Outcome: Completed/Met Goal: Ability to communicate needs accurately will improve Outcome: Completed/Met   Problem: Nutrition: Goal: Risk of aspiration will decrease Outcome: Completed/Met Goal: Dietary intake will improve Outcome: Completed/Met   Problem: Education: Goal: Knowledge of General Education information will improve Description: Including pain rating scale, medication(s)/side effects and non-pharmacologic comfort measures Outcome: Completed/Met   Problem: Health Behavior/Discharge Planning: Goal: Ability to manage health-related needs will  improve Outcome: Completed/Met   Problem: Clinical Measurements: Goal: Ability to maintain clinical measurements within normal limits will improve Outcome: Completed/Met Goal: Will remain free from infection Outcome: Completed/Met Goal: Diagnostic test results will improve Outcome: Completed/Met Goal: Respiratory complications will improve Outcome: Completed/Met Goal: Cardiovascular complication will be avoided Outcome: Completed/Met   Problem: Activity: Goal: Risk for activity intolerance will decrease Outcome: Completed/Met   Problem: Nutrition: Goal: Adequate nutrition will be maintained Outcome: Completed/Met   Problem: Coping: Goal: Level of anxiety will decrease Outcome: Completed/Met   Problem: Elimination: Goal: Will not experience complications related to bowel motility Outcome: Completed/Met Goal: Will not experience complications related to urinary retention Outcome: Completed/Met   Problem: Pain Managment: Goal: General experience of comfort will improve and/or be controlled Outcome: Completed/Met   Problem: Safety: Goal: Ability to remain free from injury will improve Outcome: Completed/Met   Problem: Skin Integrity: Goal: Risk for impaired skin integrity will decrease Outcome: Completed/Met   Problem: Education: Goal: Understanding of CV disease, CV risk reduction, and recovery process will improve Outcome: Completed/Met Goal: Individualized Educational Video(s) Outcome: Completed/Met   Problem: Activity: Goal: Ability to return to baseline activity level will improve Outcome: Completed/Met   Problem: Cardiovascular: Goal: Ability to achieve and maintain adequate cardiovascular perfusion will improve Outcome: Completed/Met Goal: Vascular access site(s) Level 0-1 will be maintained Outcome: Completed/Met   Problem: Health Behavior/Discharge Planning: Goal: Ability to safely manage health-related needs after discharge will improve Outcome:  Completed/Met   Problem: Activity: Goal: Ability to tolerate increased activity will improve Outcome: Completed/Met   Problem: Respiratory: Goal: Ability to maintain a clear airway and adequate ventilation will improve Outcome: Completed/Met   Problem: Role Relationship: Goal: Method of communication will improve Outcome: Completed/Met   Problem: Education: Goal: Ability to describe self-care measures that may prevent or decrease complications (Diabetes Survival Skills Education) will improve Outcome: Completed/Met Goal: Individualized Educational Video(s) Outcome: Completed/Met   Problem: Coping: Goal: Ability to adjust to condition or change in health will improve Outcome: Completed/Met   Problem: Fluid  Volume: Goal: Ability to maintain a balanced intake and output will improve Outcome: Completed/Met   Problem: Health Behavior/Discharge Planning: Goal: Ability to identify and utilize available resources and services will improve Outcome: Completed/Met Goal: Ability to manage health-related needs will improve Outcome: Completed/Met   Problem: Metabolic: Goal: Ability to maintain appropriate glucose levels will improve Outcome: Completed/Met   Problem: Nutritional: Goal: Maintenance of adequate nutrition will improve Outcome: Completed/Met Goal: Progress toward achieving an optimal weight will improve Outcome: Completed/Met   Problem: Skin Integrity: Goal: Risk for impaired skin integrity will decrease Outcome: Completed/Met   Problem: Tissue Perfusion: Goal: Adequacy of tissue perfusion will improve Outcome: Completed/Met

## 2023-08-08 NOTE — Progress Notes (Signed)
 PHARMACY - ANTICOAGULATION CONSULT NOTE  Pharmacy Consult:  Heparin  Indication:  History of DVT, new CVA  Allergies  Allergen Reactions   Aspartame Other (See Comments)    Intolerance to artifical sweeteners, pt states caused burning of bladder   Levaquin  [Levofloxacin ] Nausea Only   Pantoprazole  Sodium Diarrhea    Abdominal pain   Ranitidine Diarrhea and Other (See Comments)    Abdominal pain, also   Omeprazole  Diarrhea and Other (See Comments)    Abdominal pain, also   Sulfasalazine  Rash and Other (See Comments)    Fever, chills, headache, muscle pain also    Patient Measurements: Height: 5' 2 (157.5 cm) Weight: 69.8 kg (153 lb 14.1 oz) IBW/kg (Calculated) : 50.1 HEPARIN  DW (KG): 66  Vital Signs: Temp: 98.2 F (36.8 C) (07/10 0800) Temp Source: Axillary (07/10 0800) BP: 100/52 (07/10 0600) Pulse Rate: 96 (07/10 0600)  Labs: Recent Labs    08/06/23 0426 08/07/23 0421  HGB 9.5* 9.3*  HCT 29.7* 29.5*  PLT 341 352  HEPARINUNFRC 0.31 0.38  CREATININE 0.71 0.53    Estimated Creatinine Clearance: 55.6 mL/min (by C-G formula based on SCr of 0.53 mg/dL).   Assessment: 28 YOF with history of DVT on Eliquis  PTA, last dose on 6/26 at 2100.  Patient presented as a code stroke and thrombectomy was unsuccessful on 7/1.  Pharmacy consulted to dose IV heparin  per stroke protocol.    Labs not drawn this AM - pending GOC discussions. D/w provider, okay to continue current rate without level, as has been stable x several checks   Goal of Therapy:  Heparin  level 0.3-0.5 units/ml Monitor platelets by anticoagulation protocol: Yes   Plan:  Continue heparin  infusion at 1200 units/hr Daily heparin  level and CBC  Sharyne Glatter, PharmD, BCCCP Clinical Pharmacist 08/08/2023 8:17 AM

## 2023-08-08 NOTE — Progress Notes (Signed)
 eLink Physician-Brief Progress Note Patient Name: GEONNA LOCKYER DOB: 04-06-1945 MRN: [id number]   Date of Service  08/08/2023  HPI/Events of Note  Patient transitioned to Comfort Care.  eICU Interventions  Fentanyl  bolus and gtt discontinued and replaced by comfort care orders.        Kewon Statler U Rodrick Payson 08/08/2023, 7:58 PM

## 2023-08-08 NOTE — Procedures (Signed)
 Extubation Procedure Note  Patient Details:   Name: Rachel Carlson DOB: 09-09-1945 MRN: [id number]   Airway Documentation:    Vent end date: 08/08/23 Vent end time: 1455   Evaluation  O2 sats: comfort care Complications: No apparent complications Patient did tolerate procedure well. Bilateral Breath Sounds: Rhonchi, Diminished   No Pt was extubated to comfort care measures at this time  Rachel Carlson Mater 08/08/2023, 4:58 PM

## 2023-08-09 ENCOUNTER — Other Ambulatory Visit: Payer: Self-pay

## 2023-08-09 DIAGNOSIS — J910 Malignant pleural effusion: Secondary | ICD-10-CM | POA: Insufficient documentation

## 2023-08-09 DIAGNOSIS — J9691 Respiratory failure, unspecified with hypoxia: Secondary | ICD-10-CM | POA: Insufficient documentation

## 2023-08-09 DIAGNOSIS — R509 Fever, unspecified: Secondary | ICD-10-CM | POA: Insufficient documentation

## 2023-08-09 DIAGNOSIS — G936 Cerebral edema: Secondary | ICD-10-CM | POA: Insufficient documentation

## 2023-08-12 ENCOUNTER — Other Ambulatory Visit: Payer: Self-pay

## 2023-08-12 ENCOUNTER — Ambulatory Visit

## 2023-08-12 NOTE — Progress Notes (Signed)
 Patient deceased as of 09/03/2023. Disenrolled.

## 2023-08-14 ENCOUNTER — Other Ambulatory Visit: Payer: Medicare Other

## 2023-08-21 ENCOUNTER — Encounter: Payer: Medicare Other | Admitting: Family Medicine

## 2023-08-23 ENCOUNTER — Ambulatory Visit: Admitting: Gynecologic Oncology

## 2023-08-30 NOTE — Progress Notes (Addendum)
 2125 Transferred from ICU in bed, responded to painful stimuli only at this time.  Breathing on room air.  Irregular breathes noted. Chest expansion equal. Morphine  Drip Infusion maintained in progress as prescribed.   0220 Shallow, irregular, apneic & agonal breathes noted at this time. Morphine  Drip Infusion maintained as prescribed for pain management during comfort care.  0320 Cyanosed.  No signs of life noted: No respirations observed nor auscultated, no heart sounds upon auscultation, no movements, & pupils fixed & dilated & non-reactive to light. RN x 2 Pronounced that pt expired at this time. MD to be notified.  At 28 Son contacted via phone & will head to the hospital to see his mom ~ 1 hr time.  0430 Dr Epimenio Eriksson Notified that Pt observed to be cyanosed.  No signs of life noted: No respirations observed nor auscultated, no heart sounds upon auscultation, no movements, & pupils fixed & dilated & non-reactive to light. RN x 2 ( Korry Dalgleish & Joen) Pronounced pt @ 0320 as time of death.  58 Son & brother of pt both at the beside.  Emotional support given.

## 2023-08-30 NOTE — Death Summary Note (Signed)
 DEATH SUMMARY   Patient Details  Name: Rachel Carlson MRN: [id number] DOB: 07/29/1945  Admission/Discharge Information   Admit Date:  08-Aug-2023  Date of Death: Date of Death: Aug 22, 2023  Time of Death: Time of Death: 0320  Length of Stay: August 25, 2023  Referring Physician: Tower, Laine LABOR, MD   Reason(s) for Hospitalization  Stroke: large right MCA and tiny R cerebellar infarcts with R M1 occlusion s/p unsuccessful IR with TICI 0, etiology concerning for hypercoaguable state in the setting of advanced malignancy   Diagnoses  Preliminary cause of death:  Secondary Diagnoses (including complications and co-morbidities):  Principal Problem:   Stroke [facility]) Active Problems:   Hyperlipidemia   Essential hypertension   History of DVT (deep vein thrombosis)   Carcinomatosis (HCC)   Pleural effusion   Pleural effusion, malignant   Hypoxic respiratory failure (HCC)   Cytotoxic brain edema (HCC)   Fever, unspecified   Brief Hospital Course (including significant findings, care, treatment, and services provided and events leading to death)  Rachel Carlson is a 78 y.o. 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 NIH on Admission 20   Stroke: large right MCA and tiny R cerebellar infarcts with R M1 occlusion s/p unsuccessful IR with TICI 0, etiology concerning for hypercoaguable state in the setting of advanced malignancy  Code Stroke CT head- Acute right MCA infarct.  ASPECTS of 7. No intracranial hemorrhage. CTA head & neck - Occlusion of the proximal/mid M1 segment right MCA with intraluminal filling defect concerning for thrombus. Few diminutive caliber M2 and M3 branches noted likely  filled via collaterals. CT perfusion Core 62 [place] 132  S/p IR but unsuccessful with TICI 0 MRI Large acute right MCA territory infarct with 4 mm of leftward midline shift. No acute hemorrhage. MRA Head- Persistent occlusion of the right M1 MCA. Repeat head CT - Confluent Right hemisphere cytotoxic edema has developed in both the right MCA and Right PCA vascular territories.  6/29 - Repeat CT Head - Confluent Right MCA And PCA territory infarcts. mass effect has mildly progressed including 2 mm of leftward midline shift. 7/1 CT repeat MLS 2-3 mm 2D Echo EF 60 to 65%.  Left atrial size normal.  No right-to-left shunt LDL 61 HgbA1c 6.3 VTE prophylaxis - heparin  IV Eliquis  (apixaban ) daily prior to admission, now on heparin  IV, will be discontinued with extubation for comfort care Therapy recommendations:  None, one-way extubation planned for today Disposition: Planning for one-way extubation today   Cerebral cytotoxic edema  HTS at 17ml/hr -> 176ml/hr 6/28 250cc bolus 6/29 250cc bolus  Hypertonic saline discontinued  7/1 CT repeat MLS 2-3 mm   Procedural intubation  Hypoxemic respiratory failure Hx of malignant pleural effusion PCCM on board CXR: Large left and small right pleural effusions.  7/1: Thoracentesis with 1400cc   Back on propofol  -> off On Fentanyl  drip Unable to tolerate weaning from ventilator, planning for one way extubation today   Hypertension Stable Blood Pressure Goal: SBP less than 160  Long term BP goal normotensive   Hyperlipidemia Home meds:  Crestor  20mg , resumed in hospital LDL 61, goal < 70   Dysphagia Patient has post-stroke dysphagia SLP consulted Now NPO   Peritoneal carcinomatosis undergoing chemo Follows with  Dr. Katragadda at the [facility] Current protocol: niraparib  200 mg daily.  Gyn Onc-  Dr. Viktoria s/p omenectomy small bowel resection, s/p colostomy with colostomy prolapse    Hx left gastrocnemius/calf vein  distal DVT Eliquis  5mg  BID PTA No evidence of DVT this admission On heparin  IV, will d/c once extubated for comfort    Fever  Tmax 101.3--100.4--100.7--99.2-100.1-100.3-101-99.8 WBC 3.8--4.2--6.2--7.9--8.0-9.1 Urinalysis negative Sputum culture growing mixed flora, finished course of cefepime  Chest x-ray shows bilateral pleural effusions and venous congestion   Goals of Care Palliative care on board Family understands that patient is unlikely to return to baseline Family decision for one-way extubation today    Family opted for one way extubation and transition to comfort care measures. Patient expired @ 0320  on Sep 06, 2023 and family was notified    Pertinent Labs and Studies  Significant Diagnostic Studies DG CHEST PORT 1 VIEW Result Date: 08/07/2023 CLINICAL DATA:  Ventilator dependence. EXAM: PORTABLE CHEST 1 VIEW COMPARISON:  07/30/2023 FINDINGS: Endotracheal tube tip is 4.3 cm above the base of the carina. The NG tube passes into the stomach although the distal tip position is not included on the film. Right Port-A-Cath remains in place. The cardio pericardial silhouette is enlarged. Bibasilar collapse/consolidation again noted, left greater than right with small bilateral pleural effusions. Telemetry leads overlie the chest. IMPRESSION: 1. Endotracheal tube tip is 4.3 cm above the base of the carina. 2. Bibasilar collapse/consolidation, left greater than right with small bilateral pleural effusions. Electronically Signed   By: Camellia Candle M.D.   On: 08/07/2023 07:22   DG Chest 1 View Result Date: 07/30/2023 CLINICAL DATA:  Hypoxia EXAM: CHEST  1 VIEW COMPARISON:  07/30/2023 at 12:45 p.m. FINDINGS: Right chest wall Port-A-Cath tip in the right atrium. Endotracheal tube tip in the mid intrathoracic trachea 4.9 cm from the carina. Enteric tube tip in the stomach. Stable cardiomediastinal silhouette. Aortic atherosclerotic calcification. Layering bilateral pleural effusions. Pulmonary  vascular congestion. Bilateral basilar airspace opacities. No pneumothorax. IMPRESSION: 1. Layering bilateral pleural effusions with basilar airspace opacities similar to prior. 2. Unchanged lines and tubes. Electronically Signed   By: Norman Gatlin M.D.   On: 07/30/2023 19:06   DG Chest Port 1 View Result Date: 07/30/2023 CLINICAL DATA:  Post thoracentesis. EXAM: PORTABLE CHEST 1 VIEW COMPARISON:  Radiographs 07/29/2023 and 07/26/2023.  CT 03/18/2023. FINDINGS: 1245 hours. Endotracheal tube, enteric tube and right IJ Port-A-Cath are unchanged in position. Left pleural effusion has decreased in volume following presumed left-sided thoracentesis. There are residual small left greater than right pleural effusions with associated bibasilar pulmonary opacities. No evidence of pneumothorax. The heart size and mediastinal contours are stable. IMPRESSION: 1. Decreased volume of left pleural effusion following presumed left-sided thoracentesis. No evidence of pneumothorax. 2. Residual small left greater than right pleural effusions with associated bibasilar pulmonary opacities. Electronically Signed   By: Elsie Perone M.D.   On: 07/30/2023 13:24   CT HEAD WO CONTRAST ( ) Result Date: 07/30/2023 CLINICAL DATA:  78 year old female status post code stroke presentation on 07/26/2023 with right MCA infarct. Subsequent encounter. EXAM: CT HEAD WITHOUT CONTRAST TECHNIQUE: Contiguous axial images were obtained from the base of the skull through the vertex without intravenous contrast. RADIATION DOSE REDUCTION: This exam was performed according to the departmental dose-optimization program which includes automated exposure control, adjustment of the mA and/or kV according to patient size and/or use of iterative reconstruction technique. COMPARISON:  Head CT 07/28/2023 and earlier. FINDINGS: Brain: Confluent cytotoxic edema redemonstrated  in both the right MCA and PCA territories. Stable parenchymal involvement. No  malignant hemorrhagic transformation. Intracranial mass effect with mostly effaced right lateral ventricle. Leftward midline shift is not significantly changed, 2-3 mm. No ventriculomegaly. Basilar cisterns remain patent. Stable gray-white differentiation elsewhere. Vascular: Stable right MCA hyperdensity. Skull: Stable and intact. Sinuses/Orbits: Visualized paranasal sinuses and mastoids are stable and well aerated. Other: Intubated. Oral enteric tube also RC visible. Stable orbit and scalp soft tissues. IMPRESSION: 1. Stable extent of confluent right MCA and PCA territory cytotoxic edema. No malignant hemorrhagic transformation. 2. Intracranial mass effect with leftward midline shift not significantly changed, 2-3 mm. 3. No new intracranial abnormality. Electronically Signed   By: [name]   On: 07/30/2023 09:03   DG CHEST PORT 1 VIEW Result Date: 07/29/2023 CLINICAL DATA:  Fevers EXAM: PORTABLE CHEST 1 VIEW COMPARISON:  07/26/2023 FINDINGS: Endotracheal tube and gastric catheter are noted in satisfactory position. Right chest wall port is again seen and stable. Large left-sided pleural effusion is noted and stable. Small right-sided effusion is noted as well. Mild central vascular congestion is noted. IMPRESSION: Bilateral pleural effusions left greater than right. Tubes and lines as described. Mild central vascular congestion. Electronically Signed   By: Oneil Devonshire M.D.   On: 07/29/2023 19:22   CT HEAD POST STROKE FOLLOWUP/TIMED/STAT READ Result Date: 07/28/2023 CLINICAL DATA:  78 year old female status post code stroke presentation on 07/26/2023 with right MCA infarct. Subsequent encounter. EXAM: CT HEAD WITHOUT CONTRAST TECHNIQUE: Contiguous axial images were obtained from the base of the skull through the vertex without intravenous contrast. RADIATION DOSE REDUCTION: This exam was performed according to the departmental dose-optimization program which includes automated exposure control,  adjustment of the mA and/or kV according to patient size and/or use of iterative reconstruction technique. COMPARISON:  Head CT yesterday and earlier. FINDINGS: Brain: Confluent and increasingly hypodense cytotoxic edema in the right hemisphere in the right MCA and PCA territories, same configuration as demonstrated yesterday. No malignant hemorrhagic transformation. Mass effect on the right lateral ventricle, and there is now 2 mm of leftward midline shift (series 3, image 16). No ventriculomegaly. Basilar cisterns remain normal. Stable gray-white differentiation elsewhere. Vascular: Right MCA remains hyperdense. Skull: Stable, intact. Sinuses/Orbits: Visualized paranasal sinuses and mastoids are stable and well aerated. Other: Stable orbits and scalp. IMPRESSION: 1. Confluent Right MCA And PCA territory infarcts. No extension or malignant hemorrhagic transformation since yesterday. But subsequent intracranial mass effect has mildly progressed including 2 mm of leftward midline shift now. 2. No other acute intracranial abnormality. Electronically Signed   By: [name]   On: 07/28/2023 05:52   MR BRAIN WO CONTRAST Result Date: 07/27/2023 CLINICAL DATA:  Stroke, follow up EXAM: MRI HEAD WITHOUT CONTRAST MRA HEAD WITHOUT CONTRAST TECHNIQUE: Multiplanar, multi-echo pulse sequences of the brain and surrounding structures were acquired without intravenous contrast. Angiographic images of the Circle of Willis were acquired using MRA technique without intravenous contrast. COMPARISON:  Catheter arteriogram June 27 25, CTA head/neck and CT head June 27 25. FINDINGS: MRI HEAD FINDINGS Brain: Large acute right MCA territory infarct involving the right basal ganglia, insula and overlying frontal, parietal and anterior temporal lobes. Additional small acute infarct in the right cerebellum. Associated edema with 4 mm of leftward midline shift. Effacement the right lateral ventricle without hydrocephalus. No evidence of  acute hemorrhage, mass lesion, or extra-axial fluid collection. Patent basal cisterns. Vascular: See below. Skull and upper cervical spine: Normal marrow signal. Sinuses/Orbits: No acute or  significant finding. MRA HEAD FINDINGS Anterior circulation: Bilateral intracranial ICAs are patent. Left MCA and bilateral ACAs are patent. Persistent occlusion of the right M1 MCA. Posterior circulation: Bilateral intradural vertebral arteries, basilar artery and bilateral posterior cerebral arteries are patent. Fetal type PCAs largely supply the posterior circulation with small vertebrobasilar system, anatomic variant. IMPRESSION: 1. Large acute right MCA territory infarct with 4 mm of leftward midline shift. No acute hemorrhage. 2. Persistent occlusion of the right M1 MCA. Electronically Signed   By: [name]   On: 07/27/2023 23:33   MR ANGIO HEAD WO CONTRAST Result Date: 07/27/2023 CLINICAL DATA:  Stroke, follow up EXAM: MRI HEAD WITHOUT CONTRAST MRA HEAD WITHOUT CONTRAST TECHNIQUE: Multiplanar, multi-echo pulse sequences of the brain and surrounding structures were acquired without intravenous contrast. Angiographic images of the Circle of Willis were acquired using MRA technique without intravenous contrast. COMPARISON:  Catheter arteriogram June 27 25, CTA head/neck and CT head June 27 25. FINDINGS: MRI HEAD FINDINGS Brain: Large acute right MCA territory infarct involving the right basal ganglia, insula and overlying frontal, parietal and anterior temporal lobes. Additional small acute infarct in the right cerebellum. Associated edema with 4 mm of leftward midline shift. Effacement the right lateral ventricle without hydrocephalus. No evidence of acute hemorrhage, mass lesion, or extra-axial fluid collection. Patent basal cisterns. Vascular: See below. Skull and upper cervical spine: Normal marrow signal. Sinuses/Orbits: No acute or significant finding. MRA HEAD FINDINGS Anterior circulation: Bilateral  intracranial ICAs are patent. Left MCA and bilateral ACAs are patent. Persistent occlusion of the right M1 MCA. Posterior circulation: Bilateral intradural vertebral arteries, basilar artery and bilateral posterior cerebral arteries are patent. Fetal type PCAs largely supply the posterior circulation with small vertebrobasilar system, anatomic variant. IMPRESSION: 1. Large acute right MCA territory infarct with 4 mm of leftward midline shift. No acute hemorrhage. 2. Persistent occlusion of the right M1 MCA. Electronically Signed   By: [name]   On: 07/27/2023 23:33   ECHOCARDIOGRAM COMPLETE Result Date: 07/27/2023    ECHOCARDIOGRAM REPORT   Patient Name:   SUNAINA FERRANDO Date of Exam: 07/27/2023 Medical Rec #:  [id number]           Height:       62.0 in Accession #:    [id number]          Weight:       162.0 lb Date of Birth:  1945-11-29          BSA:          1.748 m Patient Age:    77 years            BP:           126/60 mmHg Patient Gender: F                   HR:           92 bpm. Exam Location:  Inpatient Procedure: 2D Echo (Both Spectral and Color Flow Doppler were utilized during            procedure). Indications:    stroke  History:        Patient has prior history of Echocardiogram examinations, most                 recent 04/27/2021. Arrythmias:PVC, Signs/Symptoms:Edema; Risk                 Factors:Hypertension, Dyslipidemia and Family History of  Coronary Artery Disease.  Sonographer:    Tinnie Barefoot RDCS Referring Phys: [id number] [facility]  Sonographer Comments: Echo performed with patient supine and on artificial respirator. IMPRESSIONS  1. Left ventricular ejection fraction, by estimation, is 60 to 65%. The left ventricle has normal function. The left ventricle has no regional wall motion abnormalities. Indeterminate diastolic filling due to E-A fusion.  2. Right ventricular systolic function is normal. The right ventricular size is normal. There is normal  pulmonary artery systolic pressure. The estimated right ventricular systolic pressure is 35.3 mmHg.  3. The mitral valve is grossly normal. Mild mitral valve regurgitation. No evidence of mitral stenosis.  4. The aortic valve is tricuspid. Aortic valve regurgitation is not visualized. No aortic stenosis is present.  5. The inferior vena cava is normal in size with greater than 50% respiratory variability, suggesting right atrial pressure of 3 mmHg. Comparison(s): No significant change from prior study. Conclusion(s)/Recommendation(s): No intracardiac source of embolism detected on this transthoracic study. Consider a transesophageal echocardiogram to exclude cardiac source of embolism if clinically indicated. FINDINGS  Left Ventricle: Left ventricular ejection fraction, by estimation, is 60 to 65%. The left ventricle has normal function. The left ventricle has no regional wall motion abnormalities. The left ventricular internal cavity size was normal in size. There is  no left ventricular hypertrophy. Indeterminate diastolic filling due to E-A fusion. Right Ventricle: The right ventricular size is normal. No increase in right ventricular wall thickness. Right ventricular systolic function is normal. There is normal pulmonary artery systolic pressure. The tricuspid regurgitant velocity is 2.84 m/s, and  with an assumed right atrial pressure of 3 mmHg, the estimated right ventricular systolic pressure is 35.3 mmHg. Left Atrium: Left atrial size was normal in size. Right Atrium: Right atrial size was normal in size. Pericardium: Trivial pericardial effusion is present. Mitral Valve: The mitral valve is grossly normal. Mild mitral valve regurgitation. No evidence of mitral valve stenosis. Tricuspid Valve: The tricuspid valve is grossly normal. Tricuspid valve regurgitation is mild . No evidence of tricuspid stenosis. Aortic Valve: The aortic valve is tricuspid. Aortic valve regurgitation is not visualized. No aortic  stenosis is present. Pulmonic Valve: The pulmonic valve was grossly normal. Pulmonic valve regurgitation is not visualized. No evidence of pulmonic stenosis. Aorta: The aortic root and ascending aorta are structurally normal, with no evidence of dilitation. Venous: The inferior vena cava is normal in size with greater than 50% respiratory variability, suggesting right atrial pressure of 3 mmHg. IAS/Shunts: The atrial septum is grossly normal. Additional Comments: There is a small pleural effusion in the left lateral region.  LEFT VENTRICLE PLAX 2D LVIDd:         4.30 cm   Diastology LVIDs:         2.70 cm   LV e' medial:    8.92 cm/s LV PW:         0.80 cm   LV E/e' medial:  10.2 LV IVS:        0.90 cm   LV e' lateral:   9.68 cm/s LVOT diam:     1.70 cm   LV E/e' lateral: 9.4 LV SV:         51 LV SV Index:   29 LVOT Area:     2.27 cm  RIGHT VENTRICLE             IVC RV Basal diam:  3.00 cm     IVC diam: 2.20 cm RV S prime:  15.00 cm/s TAPSE (M-mode): 1.8 cm LEFT ATRIUM             Index        RIGHT ATRIUM           Index LA diam:        2.50 cm 1.43 cm/m   RA Area:     12.20 cm LA Vol (A2C):   33.5 ml 19.16 ml/m  RA Volume:   28.00 ml  16.02 ml/m LA Vol (A4C):   37.7 ml 21.57 ml/m LA Biplane Vol: 37.0 ml 21.17 ml/m  AORTIC VALVE LVOT Vmax:   112.00 cm/s LVOT Vmean:  76.000 cm/s LVOT VTI:    0.223 m  AORTA Ao Root diam: 2.70 cm Ao Asc diam:  3.30 cm MITRAL VALVE                TRICUSPID VALVE MV Area (PHT): 5.02 cm     TR Peak grad:   32.3 mmHg MV Decel Time: 151 msec     TR Vmax:        284.00 cm/s MV E velocity: 90.70 cm/s MV A velocity: 102.00 cm/s  SHUNTS MV E/A ratio:  0.89         Systemic VTI:  0.22 m                             Systemic Diam: 1.70 cm Darryle Decent MD Electronically signed by Darryle Decent MD Signature Date/Time: 07/27/2023/5:51:05 PM    Final    VAS US  LOWER EXTREMITY VENOUS (DVT) Result Date: 07/27/2023  Lower Venous DVT Study Patient Name:  GATHA MCNULTY  Date of Exam:    07/26/2023 Medical Rec #: [id number]            Accession #:    [id number] Date of Birth: 08-23-1945           Patient Gender: F Patient Age:   82 years Exam [facility]  [facility] Procedure:      VAS US  LOWER EXTREMITY VENOUS (DVT) Referring Phys: ARY XU --------------------------------------------------------------------------------  Indications: Stroke.  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 Anticoagulation: Eliquis . Limitations: Bandages. Comparison Study: Previous exam on 07/24/2022 was positive for IM thrombus of                   left gastrocs Performing Technologist: Ezzie Potters RVT, RDMS  Examination Guidelines: A complete evaluation includes B-mode imaging, spectral Doppler, color Doppler, and power Doppler as needed of all accessible portions of each vessel. Bilateral testing is considered an integral part of a complete examination. Limited examinations for reoccurring indications may be performed as noted. The reflux portion of the exam is performed with the patient in reverse Trendelenburg.  +---------+---------------+---------+-----------+----------+-------------------+ RIGHT    CompressibilityPhasicitySpontaneityPropertiesThrombus Aging      +---------+---------------+---------+-----------+----------+-------------------+ CFV      Full           Yes      Yes                                      +---------+---------------+---------+-----------+----------+-------------------+ SFJ      Full                                                             +---------+---------------+---------+-----------+----------+-------------------+  FV Prox                 Yes      Yes                  Not well visualized +---------+---------------+---------+-----------+----------+-------------------+ FV Mid   Full           Yes      Yes                                      +---------+---------------+---------+-----------+----------+-------------------+ FV  DistalFull           Yes      Yes                                      +---------+---------------+---------+-----------+----------+-------------------+ PFV                                                   unable to visualize +---------+---------------+---------+-----------+----------+-------------------+ POP      Full           Yes      Yes                                      +---------+---------------+---------+-----------+----------+-------------------+ PTV      Full                                                             +---------+---------------+---------+-----------+----------+-------------------+ PERO     Full                                                             +---------+---------------+---------+-----------+----------+-------------------+   +---------+---------------+---------+-----------+----------+--------------+ LEFT     CompressibilityPhasicitySpontaneityPropertiesThrombus Aging +---------+---------------+---------+-----------+----------+--------------+ CFV      Full           Yes      Yes                                 +---------+---------------+---------+-----------+----------+--------------+ SFJ      Full                                                        +---------+---------------+---------+-----------+----------+--------------+ FV Prox  Full           Yes      Yes                                 +---------+---------------+---------+-----------+----------+--------------+  FV Mid   Full           Yes      Yes                                 +---------+---------------+---------+-----------+----------+--------------+ FV DistalFull           Yes      Yes                                 +---------+---------------+---------+-----------+----------+--------------+ PFV      Full                                                        +---------+---------------+---------+-----------+----------+--------------+  POP      Full           Yes      Yes                                 +---------+---------------+---------+-----------+----------+--------------+ PTV      Full                                                        +---------+---------------+---------+-----------+----------+--------------+ PERO     Full                                                        +---------+---------------+---------+-----------+----------+--------------+     Summary: BILATERAL: - No evidence of deep vein thrombosis seen in the lower extremities, bilaterally. -No evidence of popliteal cyst, bilaterally.   *See table(s) above for measurements and observations. Electronically signed by Norman Serve on 07/27/2023 at 3:46:32 PM.    Final    US  EKG SITE RITE Result Date: 07/27/2023 If Site Rite image not attached, placement could not be confirmed due to current cardiac rhythm.  CT HEAD WO CONTRAST ( ) Result Date: 07/27/2023 CLINICAL DATA:  78 year old female status post code stroke presentation yesterday with right MCA infarct. Subsequent encounter. EXAM: CT HEAD WITHOUT CONTRAST TECHNIQUE: Contiguous axial images were obtained from the base of the skull through the vertex without intravenous contrast. RADIATION DOSE REDUCTION: This exam was performed according to the departmental dose-optimization program which includes automated exposure control, adjustment of the mA and/or kV according to patient size and/or use of iterative reconstruction technique. COMPARISON:  Head CT 1144 hours yesterday. FINDINGS: Brain: Confluent right hemisphere cytotoxic edema has developed throughout most of the right MCA and right PCA vascular territories (series 3, image 19). No malignant hemorrhagic transformation. Mild mass effect on the right lateral ventricle with no midline shift. Basilar cisterns remain patent. Stable gray-white differentiation elsewhere. Vascular: Stable. Skull: Stable and intact. Sinuses/Orbits: Visualized  paranasal sinuses and mastoids are stable and well aerated. Other: No acute orbit or scalp soft tissue finding. IMPRESSION: Confluent Right hemisphere cytotoxic edema has developed  in both the Right MCA and Right PCA vascular territories. No malignant hemorrhagic transformation. Mild mass effect on the right lateral ventricle with no midline shift. Electronically Signed   By: [name]   On: 07/27/2023 10:00   DG CHEST PORT 1 VIEW Result Date: 07/26/2023 CLINICAL DATA:  Intubation and enteric tube placement EXAM: PORTABLE CHEST 1 VIEW COMPARISON:  05/30/2023 FINDINGS: Endotracheal tube tip in the intrathoracic trachea 4.5 cm from the carina. Enteric tube tip and side-port in the stomach. Right chest wall Port-A-Cath tip in the right atrium. Large left and small right pleural effusions. Bilateral airspace opacities in the mid and lower lungs. No pneumothorax. IMPRESSION: 1. Endotracheal tube tip in the intrathoracic trachea 4.5 cm from the carina. 2. Enteric tube tip and side-port in the stomach. 3. Large left and small right pleural effusions. 4. Bilateral airspace opacities in the mid and lower lungs may be due to edema or infection. Electronically Signed   By: Norman Gatlin M.D.   On: 07/26/2023 22:12   IR PERCUTANEOUS ART THROMBECTOMY/INFUSION INTRACRANIAL INC DIAG ANGIO Result Date: 07/26/2023 PROCEDURE PERFORMED: 1. Stroke thrombectomy 2. Ultrasound vascular access 3. Cone beam CT for treatment planning COMPARISON:  CT perfusion performed May 07, 2023. CLINICAL DATA:  78 year old female with acute ischemic stroke with right MCA syndrome in CT imaging compatible with the right M1 occlusion. Pre treatment aspects score measured 7. NIH stroke scale was measured at 20. Patient was anticoagulated and not a candidate for thrombo lytic therapy. After multidisciplinary consensus conference, the patient's husband was offered stroke thrombectomy for his wife and provided informed consent. INDICATION: Acute  ischemic stroke. ANESTHESIA/SEDATION: General anesthesia was utilized for the procedure. CONTRAST:  Approximately 70 cc Omnipaque  300 MEDICATIONS: None. FLUOROSCOPY TIME:  Fluoroscopy Time: 23 minutes (873 mGy). COMPLICATIONS: None immediate. BODY OF REPORT: Following a full explanation of the procedure along with the potential associated complications, an informed witnessed consent was obtained. The patient was then placed under general anesthesia by the Department of Anesthesiology at [facility]. The right groin was prepped and draped in the usual sterile fashion. Ultrasound was used to study the right common femoral artery which was patent. Using real-time ultrasound guidance, a 21 gauge introducer needle was used to access the right common femoral artery. Access was performed at 1241 p.m. A hard copy image ultrasound the same date uncertain PACS. Using this access, a 6 French sheath was placed in the descending thoracic aorta. Next, selective catheterization the right internal carotid artery was performed. A selective arteriogram was performed which demonstrated that the internal carotid artery was patent. A right M1 occlusion was identified, pre treatment TICI score = 0. Next, a penumbra 43 and red 62 catheter was used to selectively catheterize the right M1 segment. Selective catheterization was achieved at 1302. Suction thrombectomy was performed at this site which did not yield significant thrombus. Next, a second pass was performed using the same system at 13:08. This also did not yield significant thrombus. I changed approach at this point and I deployed in [employer] trap thrombectomy device in the right M1 segment at 13:13. This did not yield significant thrombus and therefore a second attempt was performed at 13:22 using this device. As this was unsuccessful I elected to transition to the [facility] device. Selective catheterization of the MCA was performed and a solitaire 6 mm device was deployed at  13:29. This did not yield significant thrombus in therefore this was deployed again at 13:37 as well as  at 13:44. After reviewing the available imaging and repeating a arteriogram with no significant progress I elected to terminate the procedure due to increasing risk of complication and decreasing chance of technical success. Post treatment TICI score = 0. Evaluation of the right femoral access site demonstrated at this site was suitable for closure device. A 6 French Angio-Seal device was deployed without complication. Cone beam CT was then performed to evaluate for intracranial hemorrhage and treatment planning. This demonstrated no evidence significant acute intracranial hemorrhage. The patient was removed from anesthesia and transferred to recovery in stable condition. IMPRESSION: 1. Unsuccessful right MCA thrombectomy (TICI=0). PLAN: 1. To ICU for postoperative supportive care. Electronically Signed   By: Maude Naegeli M.D.   On: 07/26/2023 15:04   IR US  Guide Vasc Access Right Result Date: 07/26/2023 PROCEDURE PERFORMED: 1. Stroke thrombectomy 2. Ultrasound vascular access 3. Cone beam CT for treatment planning COMPARISON:  CT perfusion performed May 07, 2023. CLINICAL DATA:  78 year old female with acute ischemic stroke with right MCA syndrome in CT imaging compatible with the right M1 occlusion. Pre treatment aspects score measured 7. NIH stroke scale was measured at 20. Patient was anticoagulated and not a candidate for thrombo lytic therapy. After multidisciplinary consensus conference, the patient's husband was offered stroke thrombectomy for his wife and provided informed consent. INDICATION: Acute ischemic stroke. ANESTHESIA/SEDATION: General anesthesia was utilized for the procedure. CONTRAST:  Approximately 70 cc Omnipaque  300 MEDICATIONS: None. FLUOROSCOPY TIME:  Fluoroscopy Time: 23 minutes (873 mGy). COMPLICATIONS: None immediate. BODY OF REPORT: Following a full explanation of the procedure  along with the potential associated complications, an informed witnessed consent was obtained. The patient was then placed under general anesthesia by the Department of Anesthesiology at [facility]. The right groin was prepped and draped in the usual sterile fashion. Ultrasound was used to study the right common femoral artery which was patent. Using real-time ultrasound guidance, a 21 gauge introducer needle was used to access the right common femoral artery. Access was performed at 1241 p.m. A hard copy image ultrasound the same date uncertain PACS. Using this access, a 6 French sheath was placed in the descending thoracic aorta. Next, selective catheterization the right internal carotid artery was performed. A selective arteriogram was performed which demonstrated that the internal carotid artery was patent. A right M1 occlusion was identified, pre treatment TICI score = 0. Next, a penumbra 43 and red 62 catheter was used to selectively catheterize the right M1 segment. Selective catheterization was achieved at 1302. Suction thrombectomy was performed at this site which did not yield significant thrombus. Next, a second pass was performed using the same system at 13:08. This also did not yield significant thrombus. I changed approach at this point and I deployed in [employer]MB[employer] trap thrombectomy device in the right M1 segment at 13:13. This did not yield significant thrombus and therefore a second attempt was performed at 13:22 using this device. As this was unsuccessful I elected to transition to the [facility] device. Selective catheterization of the MCA was performed and a solitaire 6 mm device was deployed at 13:29. This did not yield significant thrombus in therefore this was deployed again at 13:37 as well as at 13:44. After reviewing the available imaging and repeating a arteriogram with no significant progress I elected to terminate the procedure due to increasing risk of complication and decreasing  chance of technical success. Post treatment TICI score = 0. Evaluation of the right femoral access site demonstrated at this site  was suitable for closure device. A 6 French Angio-Seal device was deployed without complication. Cone beam CT was then performed to evaluate for intracranial hemorrhage and treatment planning. This demonstrated no evidence significant acute intracranial hemorrhage. The patient was removed from anesthesia and transferred to recovery in stable condition. IMPRESSION: 1. Unsuccessful right MCA thrombectomy (TICI=0). PLAN: 1. To ICU for postoperative supportive care. Electronically Signed   By: Maude Naegeli M.D.   On: 07/26/2023 15:04   IR CT Head Ltd Result Date: 07/26/2023 PROCEDURE PERFORMED: 1. Stroke thrombectomy 2. Ultrasound vascular access 3. Cone beam CT for treatment planning COMPARISON:  CT perfusion performed May 07, 2023. CLINICAL DATA:  78 year old female with acute ischemic stroke with right MCA syndrome in CT imaging compatible with the right M1 occlusion. Pre treatment aspects score measured 7. NIH stroke scale was measured at 20. Patient was anticoagulated and not a candidate for thrombo lytic therapy. After multidisciplinary consensus conference, the patient's husband was offered stroke thrombectomy for his wife and provided informed consent. INDICATION: Acute ischemic stroke. ANESTHESIA/SEDATION: General anesthesia was utilized for the procedure. CONTRAST:  Approximately 70 cc Omnipaque  300 MEDICATIONS: None. FLUOROSCOPY TIME:  Fluoroscopy Time: 23 minutes (873 mGy). COMPLICATIONS: None immediate. BODY OF REPORT: Following a full explanation of the procedure along with the potential associated complications, an informed witnessed consent was obtained. The patient was then placed under general anesthesia by the Department of Anesthesiology at [facility]. The right groin was prepped and draped in the usual sterile fashion. Ultrasound was used to study the right  common femoral artery which was patent. Using real-time ultrasound guidance, a 21 gauge introducer needle was used to access the right common femoral artery. Access was performed at 1241 p.m. A hard copy image ultrasound the same date uncertain PACS. Using this access, a 6 French sheath was placed in the descending thoracic aorta. Next, selective catheterization the right internal carotid artery was performed. A selective arteriogram was performed which demonstrated that the internal carotid artery was patent. A right M1 occlusion was identified, pre treatment TICI score = 0. Next, a penumbra 43 and red 62 catheter was used to selectively catheterize the right M1 segment. Selective catheterization was achieved at 1302. Suction thrombectomy was performed at this site which did not yield significant thrombus. Next, a second pass was performed using the same system at 13:08. This also did not yield significant thrombus. I changed approach at this point and I deployed in [employer]MB[employer] trap thrombectomy device in the right M1 segment at 13:13. This did not yield significant thrombus and therefore a second attempt was performed at 13:22 using this device. As this was unsuccessful I elected to transition to the [facility] device. Selective catheterization of the MCA was performed and a solitaire 6 mm device was deployed at 13:29. This did not yield significant thrombus in therefore this was deployed again at 13:37 as well as at 13:44. After reviewing the available imaging and repeating a arteriogram with no significant progress I elected to terminate the procedure due to increasing risk of complication and decreasing chance of technical success. Post treatment TICI score = 0. Evaluation of the right femoral access site demonstrated at this site was suitable for closure device. A 6 French Angio-Seal device was deployed without complication. Cone beam CT was then performed to evaluate for intracranial hemorrhage and treatment  planning. This demonstrated no evidence significant acute intracranial hemorrhage. The patient was removed from anesthesia and transferred to recovery in stable condition. IMPRESSION: 1. Unsuccessful  right MCA thrombectomy (TICI=0). PLAN: 1. To ICU for postoperative supportive care. Electronically Signed   By: Maude Naegeli M.D.   On: 07/26/2023 15:04   CT ANGIO HEAD NECK W WO CM W PERF (CODE STROKE) Result Date: 07/26/2023 CLINICAL DATA:  Neuro deficit, concern for stroke, acute right MCA territory infarct on noncontrast CT. Left-sided weakness and slurred speech. EXAM: CT ANGIOGRAPHY HEAD AND NECK CT PERFUSION BRAIN TECHNIQUE: Multidetector CT imaging of the head and neck was performed using the standard protocol during bolus administration of intravenous contrast. Multiplanar CT image reconstructions and MIPs were obtained to evaluate the vascular anatomy. Carotid stenosis measurements (when applicable) are obtained utilizing NASCET criteria, using the distal internal carotid diameter as the denominator. Multiphase CT imaging of the brain was performed following IV bolus contrast injection. Subsequent parametric perfusion maps were calculated using RAPID software. RADIATION DOSE REDUCTION: This exam was performed according to the departmental dose-optimization program which includes automated exposure control, adjustment of the mA and/or kV according to patient size and/or use of iterative reconstruction technique. CONTRAST:  OMNIPAQUE  IOHEXOL  350 MG/ML SOLN COMPARISON:  Same-day head CT. FINDINGS: CTA NECK FINDINGS Aortic arch: Common origin of the brachiocephalic and left common carotid arteries. Imaged portion shows no evidence of aneurysm or dissection. Mild atherosclerosis of the visualized aortic arch. No significant stenosis of the major arch vessel origins. Pulmonary arteries: As permitted by contrast timing, there are no filling defects in the visualized pulmonary arteries. Subclavian arteries:  The subclavian arteries are patent bilaterally. Right carotid system: No evidence of dissection, stenosis (50% or greater), or occlusion. Minimal atherosclerosis along the proximal cervical ICA. Left carotid system: No evidence of dissection, stenosis (50% or greater), or occlusion. Minimal atherosclerosis at the carotid bifurcation. Vertebral arteries: Codominant. No evidence of dissection, stenosis (50% or greater), or occlusion. The vertebral arteries are relatively small in caliber bilaterally. Slightly limited visualization of the distal V1 and proximal V2 segments of the left vertebral artery due to adjacent dense venous contrast. Distal tapering of the right V4 segment after the origin of the right PICA. Skeleton: No acute or aggressive finding noted. Other neck: The visualized airway is patent. No cervical lymphadenopathy. Upper chest: Large bilateral pleural effusions, left greater than right. Partially visualized right chest wall Port-A-Cath. Review of the MIP images confirms the above findings CTA HEAD FINDINGS ANTERIOR CIRCULATION: The intracranial internal carotid arteries are patent bilaterally. There is minimal atherosclerosis involving the bilateral carotid siphons without significant stenosis. MCAs: There is abrupt occlusion of the proximal/mid M1 segment of the right MCA with possible intraluminal filling defect concerning for thrombus. There are minimal diminutive caliber M2 and M3 inferior division branches of the right MCA which are likely perfused via collaterals. The left MCA is patent. ACAs: The anterior cerebral arteries are patent bilaterally. POSTERIOR CIRCULATION: No significant stenosis, proximal occlusion, aneurysm, or vascular malformation. PCAs: The PCAs are patent bilaterally and are primarily supplied via the posterior communicating arteries. Diminutive P1 segments noted bilaterally. Pcomm: The posterior communicating arteries are visualized bilaterally. SCAs: The superior  cerebellar arteries are patent bilaterally. Basilar artery: The basilar artery is patent but is diminutive in caliber with distal tapering. AICAs: Not well visualized. PICAs: Patent Vertebral arteries: The intracranial vertebral arteries are patent. Venous sinuses: As permitted by contrast timing, patent. Anatomic variants: None Review of the MIP images confirms the above findings CT Brain Perfusion Findings: ASPECTS: 7 CBF (<30%) Volume: 62mL Perfusion (Tmax>6.0s) volume: Mismatch Volume: 70mL Infarction Location:Core infarct throughout the  right MCA territory. Surrounding region of elevated T-max with mismatch ratio of 2.1. IMPRESSION: Occlusion of the proximal/mid M1 segment right MCA with intraluminal filling defect concerning for thrombus. Few diminutive caliber M2 and M3 branches noted likely filled via collaterals. 62 mL region of core infarct in the right MCA territory with surrounding at risk tissue. Mismatch ratio of 2.1. Intracranial arterial vasculature is otherwise patent. Findings suggestive of congenital vertebrobasilar hypoplasia. Large bilateral pleural effusions, left greater than right. Aortic Atherosclerosis (ICD10-I70.0). The first 2 impression points were communicated to Dr. Jerri at 12:12 pm on 07/26/2023 by text page via the [facility] messaging system. Electronically Signed   By: Donnice Mania M.D.   On: 07/26/2023 12:23   CT HEAD CODE STROKE WO CONTRAST Result Date: 07/26/2023 CLINICAL DATA:  Code stroke. Neuro deficit, acute, stroke suspected. Left-sided weakness, rightward gaze, and slurred speech. EXAM: CT HEAD WITHOUT CONTRAST TECHNIQUE: Contiguous axial images were obtained from the base of the skull through the vertex without intravenous contrast. RADIATION DOSE REDUCTION: This exam was performed according to the departmental dose-optimization program which includes automated exposure control, adjustment of the mA and/or kV according to patient size and/or use of iterative  reconstruction technique. COMPARISON:  None Available. FINDINGS: Brain: There is loss of gray-differentiation involving the right insula, a portion of the right lentiform nucleus, and likely a portion of the right temporal lobe consistent with an acute infarct. No intracranial hemorrhage, mass, midline shift, or extra-axial fluid collection is identified. Cerebral volume is normal. The ventricles are normal in size. Vascular: Hyperdense right MCA. Skull: No fracture or suspicious lesion. Sinuses/Orbits: The paranasal sinuses and mastoid air cells are well aerated. Bilateral cataract extraction. Other: None. ASPECTS (Alberta Stroke Program Early CT Score) - Ganglionic level infarction (caudate, lentiform nuclei, internal capsule, insula, M1-M3 cortex): 4 - Supraganglionic infarction (M4-M6 cortex): 3 Total score (0-10 with 10 being normal): 7 These results were communicated to Dr. Jerri at 11:51 am on 07/26/2023 by text page via the [facility] messaging system. IMPRESSION: 1. Acute right MCA infarct.  ASPECTS of 7. 2. No intracranial hemorrhage. Electronically Signed   By: Dasie Hamburg M.D.   On: 07/26/2023 11:51    Microbiology Recent Results (from the past 240 hours)  MRSA Next Gen by PCR, Nasal     Status: None   Collection Time: 07/30/23  9:07 AM   Specimen: Nasal Mucosa; Nasal Swab  Result Value Ref Range Status   MRSA by PCR Next Gen NOT DETECTED NOT DETECTED Final    Comment: (NOTE) The GeneXpert MRSA Assay (FDA approved for NASAL specimens only), is one component of a comprehensive MRSA colonization surveillance program. It is not intended to diagnose MRSA infection nor to guide or monitor treatment for MRSA infections. Test performance is not FDA approved in patients less than 57 years old. Performed at [facility], [address], [place], [place] [address]   Body fluid culture w Gram Stain     Status: None   Collection Time: 07/30/23 12:57 PM   Specimen: Pleural Fluid  Result Value Ref  Range Status   Specimen Description PLEURAL  Final   Special Requests NONE  Final   Gram Stain NO WBC SEEN NO ORGANISMS SEEN   Final   Culture   Final    NO GROWTH 3 DAYS Performed at [facility], [address], [place], [place] [address]    Report Status 08/02/2023 FINAL  Final    Lab Basic Metabolic Panel:  Recent Labs  Lab 08/03/23 0521 08/04/23 0341 08/05/23 0353 08/06/23 0426 08/07/23 0421  NA 146* 143 139 137 136  K 4.0 3.8 3.7 3.9 4.3  CL 111 108 102 103 99  CO2 27 27 28 25 28   GLUCOSE 156* 219* 197* 187* 193*  BUN 25* 25* 25* 21 24*  CREATININE 0.52 0.44 0.51 0.71 0.53  CALCIUM  9.4 9.4 9.4 9.6 9.8   Liver Function Tests: No results for input(s): AST, ALT, ALKPHOS, BILITOT, PROT, ALBUMIN  in the last 168 hours. No results for input(s): LIPASE, AMYLASE in the last 168 hours. No results for input(s): AMMONIA in the last 168 hours. CBC: Recent Labs  Lab 08/03/23 0521 08/04/23 0341 08/05/23 0353 08/06/23 0426 08/07/23 0421  WBC 6.2 7.9 8.0 9.6 9.1  HGB 8.9* 8.9* 9.0* 9.5* 9.3*  HCT 28.4* 28.8* 27.7* 29.7* 29.5*  MCV 99.6 100.0 99.6 100.0 101.0*  PLT 267 282 278 341 352   Cardiac Enzymes: No results for input(s): CKTOTAL, CKMB, CKMBINDEX, TROPONINI in the last 168 hours. Sepsis Labs: Recent Labs  Lab 08/04/23 0341 08/05/23 0353 08/06/23 0426 08/07/23 0421  WBC 7.9 8.0 9.6 9.1    Karna DELENA Geralds Aug 31, 2023, 8:32 AM

## 2023-08-30 DEATH — deceased

## 2023-10-31 ENCOUNTER — Ambulatory Visit: Admitting: Obstetrics and Gynecology

## 2023-11-27 ENCOUNTER — Ambulatory Visit: Admitting: Obstetrics and Gynecology

## 2024-02-11 ENCOUNTER — Ambulatory Visit: Admitting: Physician Assistant

## 2024-05-08 ENCOUNTER — Encounter (INDEPENDENT_AMBULATORY_CARE_PROVIDER_SITE_OTHER): Admitting: Ophthalmology
